# Patient Record
Sex: Female | Born: 1935 | Race: Black or African American | Hispanic: No | Smoking: Former smoker
Health system: Southern US, Community
[De-identification: ages and names within clinical notes are randomized; demographics above are authoritative.]

## PROBLEM LIST (undated history)

## (undated) DIAGNOSIS — J449 Chronic obstructive pulmonary disease, unspecified: Secondary | ICD-10-CM

## (undated) DIAGNOSIS — M545 Low back pain, unspecified: Secondary | ICD-10-CM

## (undated) DIAGNOSIS — R262 Difficulty in walking, not elsewhere classified: Secondary | ICD-10-CM

## (undated) DIAGNOSIS — N390 Urinary tract infection, site not specified: Secondary | ICD-10-CM

## (undated) DIAGNOSIS — R51 Headache: Secondary | ICD-10-CM

## (undated) DIAGNOSIS — I10 Essential (primary) hypertension: Secondary | ICD-10-CM

## (undated) DIAGNOSIS — J189 Pneumonia, unspecified organism: Secondary | ICD-10-CM

## (undated) DIAGNOSIS — M1990 Unspecified osteoarthritis, unspecified site: Secondary | ICD-10-CM

## (undated) DIAGNOSIS — D259 Leiomyoma of uterus, unspecified: Secondary | ICD-10-CM

## (undated) DIAGNOSIS — K565 Intestinal adhesions [bands], unspecified as to partial versus complete obstruction: Secondary | ICD-10-CM

## (undated) DIAGNOSIS — F419 Anxiety disorder, unspecified: Secondary | ICD-10-CM

## (undated) DIAGNOSIS — E785 Hyperlipidemia, unspecified: Secondary | ICD-10-CM

## (undated) HISTORY — PX: COLONOSCOPY, DIAGNOSTIC (SCREENING): SHX174

## (undated) HISTORY — PX: BRAIN SURGERY: SHX531

## (undated) HISTORY — PX: LUMBAR SPINE SURGERY: SHX701

## (undated) HISTORY — PX: TONSILLECTOMY: SUR1361

## (undated) HISTORY — PX: EYE SURGERY: SHX253

## (undated) HISTORY — PX: CERVICAL SPINE SURGERY: SHX589

## (undated) HISTORY — DX: Essential (primary) hypertension: I10

## (undated) HISTORY — DX: Hyperlipidemia, unspecified: E78.5

## (undated) HISTORY — DX: Leiomyoma of uterus, unspecified: D25.9

## (undated) HISTORY — DX: Chronic obstructive pulmonary disease, unspecified: J44.9

## (undated) HISTORY — PX: JOINT REPLACEMENT: SHX530

## (undated) HISTORY — PX: BACK SURGERY: SHX140

---
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
## 1959-10-10 HISTORY — PX: TUBAL LIGATION: SHX77

## 1998-06-02 ENCOUNTER — Encounter: Admission: RE | Admit: 1998-06-02 | Discharge: 1998-06-02 | Payer: Self-pay | Admitting: *Deleted

## 1998-09-29 ENCOUNTER — Emergency Department (HOSPITAL_COMMUNITY): Admission: EM | Admit: 1998-09-29 | Discharge: 1998-09-29 | Payer: Self-pay | Admitting: Emergency Medicine

## 1999-04-11 ENCOUNTER — Ambulatory Visit (HOSPITAL_COMMUNITY): Admission: RE | Admit: 1999-04-11 | Discharge: 1999-04-11 | Payer: Self-pay | Admitting: Family Medicine

## 1999-05-28 ENCOUNTER — Encounter: Payer: Self-pay | Admitting: Emergency Medicine

## 1999-05-28 ENCOUNTER — Inpatient Hospital Stay (HOSPITAL_COMMUNITY): Admission: EM | Admit: 1999-05-28 | Discharge: 1999-05-29 | Payer: Self-pay | Admitting: Emergency Medicine

## 1999-08-05 ENCOUNTER — Other Ambulatory Visit: Admission: RE | Admit: 1999-08-05 | Discharge: 1999-08-05 | Payer: Self-pay | Admitting: Family Medicine

## 2000-02-02 ENCOUNTER — Encounter: Payer: Self-pay | Admitting: Family Medicine

## 2000-02-02 ENCOUNTER — Encounter: Admission: RE | Admit: 2000-02-02 | Discharge: 2000-02-02 | Payer: Self-pay | Admitting: Family Medicine

## 2000-05-07 ENCOUNTER — Encounter: Admission: RE | Admit: 2000-05-07 | Discharge: 2000-08-05 | Payer: Self-pay | Admitting: Anesthesiology

## 2000-07-16 ENCOUNTER — Encounter: Payer: Self-pay | Admitting: Family Medicine

## 2000-07-16 ENCOUNTER — Encounter: Admission: RE | Admit: 2000-07-16 | Discharge: 2000-07-16 | Payer: Self-pay | Admitting: Family Medicine

## 2000-08-06 ENCOUNTER — Other Ambulatory Visit: Admission: RE | Admit: 2000-08-06 | Discharge: 2000-08-06 | Payer: Self-pay | Admitting: Family Medicine

## 2001-03-18 ENCOUNTER — Encounter: Admission: RE | Admit: 2001-03-18 | Discharge: 2001-03-18 | Payer: Self-pay | Admitting: Orthopedic Surgery

## 2001-03-18 ENCOUNTER — Encounter: Payer: Self-pay | Admitting: Orthopedic Surgery

## 2001-05-27 ENCOUNTER — Encounter: Admission: RE | Admit: 2001-05-27 | Discharge: 2001-05-27 | Payer: Self-pay | Admitting: Orthopedic Surgery

## 2001-05-27 ENCOUNTER — Ambulatory Visit (HOSPITAL_BASED_OUTPATIENT_CLINIC_OR_DEPARTMENT_OTHER): Admission: RE | Admit: 2001-05-27 | Discharge: 2001-05-27 | Payer: Self-pay | Admitting: Orthopedic Surgery

## 2001-05-27 ENCOUNTER — Encounter: Payer: Self-pay | Admitting: Orthopedic Surgery

## 2001-07-17 ENCOUNTER — Encounter: Payer: Self-pay | Admitting: Family Medicine

## 2001-07-17 ENCOUNTER — Encounter: Admission: RE | Admit: 2001-07-17 | Discharge: 2001-07-17 | Payer: Self-pay | Admitting: Family Medicine

## 2001-08-14 ENCOUNTER — Other Ambulatory Visit: Admission: RE | Admit: 2001-08-14 | Discharge: 2001-08-14 | Payer: Self-pay | Admitting: Family Medicine

## 2001-08-15 ENCOUNTER — Encounter: Admission: RE | Admit: 2001-08-15 | Discharge: 2001-08-15 | Payer: Self-pay | Admitting: Family Medicine

## 2001-08-15 ENCOUNTER — Encounter: Payer: Self-pay | Admitting: Family Medicine

## 2002-07-21 ENCOUNTER — Encounter: Admission: RE | Admit: 2002-07-21 | Discharge: 2002-07-21 | Payer: Self-pay | Admitting: Family Medicine

## 2002-07-21 ENCOUNTER — Encounter: Payer: Self-pay | Admitting: Family Medicine

## 2002-08-15 ENCOUNTER — Encounter: Admission: RE | Admit: 2002-08-15 | Discharge: 2002-08-15 | Payer: Self-pay | Admitting: Family Medicine

## 2002-08-15 ENCOUNTER — Encounter: Payer: Self-pay | Admitting: Family Medicine

## 2002-10-22 ENCOUNTER — Encounter (INDEPENDENT_AMBULATORY_CARE_PROVIDER_SITE_OTHER): Payer: Self-pay | Admitting: Specialist

## 2002-10-22 ENCOUNTER — Ambulatory Visit (HOSPITAL_COMMUNITY): Admission: RE | Admit: 2002-10-22 | Discharge: 2002-10-22 | Payer: Self-pay | Admitting: Gastroenterology

## 2003-06-04 ENCOUNTER — Encounter: Payer: Self-pay | Admitting: Family Medicine

## 2003-06-04 ENCOUNTER — Encounter: Admission: RE | Admit: 2003-06-04 | Discharge: 2003-06-04 | Payer: Self-pay | Admitting: Family Medicine

## 2003-07-27 ENCOUNTER — Encounter: Payer: Self-pay | Admitting: Family Medicine

## 2003-07-27 ENCOUNTER — Encounter: Admission: RE | Admit: 2003-07-27 | Discharge: 2003-07-27 | Payer: Self-pay | Admitting: Family Medicine

## 2003-08-17 ENCOUNTER — Other Ambulatory Visit: Admission: RE | Admit: 2003-08-17 | Discharge: 2003-08-17 | Payer: Self-pay | Admitting: Family Medicine

## 2003-09-01 ENCOUNTER — Encounter: Admission: RE | Admit: 2003-09-01 | Discharge: 2003-09-01 | Payer: Self-pay | Admitting: Family Medicine

## 2004-07-27 ENCOUNTER — Encounter: Admission: RE | Admit: 2004-07-27 | Discharge: 2004-07-27 | Payer: Self-pay | Admitting: Family Medicine

## 2004-08-23 ENCOUNTER — Other Ambulatory Visit: Admission: RE | Admit: 2004-08-23 | Discharge: 2004-08-23 | Payer: Self-pay | Admitting: Family Medicine

## 2005-06-24 ENCOUNTER — Emergency Department (HOSPITAL_COMMUNITY): Admission: EM | Admit: 2005-06-24 | Discharge: 2005-06-25 | Payer: Self-pay | Admitting: Emergency Medicine

## 2005-07-31 ENCOUNTER — Encounter: Admission: RE | Admit: 2005-07-31 | Discharge: 2005-07-31 | Payer: Self-pay | Admitting: Family Medicine

## 2005-08-24 ENCOUNTER — Encounter: Admission: RE | Admit: 2005-08-24 | Discharge: 2005-08-24 | Payer: Self-pay | Admitting: Family Medicine

## 2005-09-28 ENCOUNTER — Other Ambulatory Visit: Admission: RE | Admit: 2005-09-28 | Discharge: 2005-09-28 | Payer: Self-pay | Admitting: Obstetrics and Gynecology

## 2005-12-09 ENCOUNTER — Emergency Department (HOSPITAL_COMMUNITY): Admission: EM | Admit: 2005-12-09 | Discharge: 2005-12-09 | Payer: Self-pay | Admitting: Family Medicine

## 2006-08-03 ENCOUNTER — Encounter: Admission: RE | Admit: 2006-08-03 | Discharge: 2006-08-03 | Payer: Self-pay | Admitting: Family Medicine

## 2006-08-23 ENCOUNTER — Inpatient Hospital Stay (HOSPITAL_COMMUNITY): Admission: RE | Admit: 2006-08-23 | Discharge: 2006-08-29 | Payer: Self-pay | Admitting: Orthopaedic Surgery

## 2006-08-27 ENCOUNTER — Ambulatory Visit: Payer: Self-pay | Admitting: Physical Medicine & Rehabilitation

## 2007-08-06 ENCOUNTER — Encounter: Admission: RE | Admit: 2007-08-06 | Discharge: 2007-08-06 | Payer: Self-pay | Admitting: Family Medicine

## 2007-10-16 ENCOUNTER — Other Ambulatory Visit: Admission: RE | Admit: 2007-10-16 | Discharge: 2007-10-16 | Payer: Self-pay | Admitting: Family Medicine

## 2008-04-17 ENCOUNTER — Encounter: Admission: RE | Admit: 2008-04-17 | Discharge: 2008-04-17 | Payer: Self-pay | Admitting: Orthopaedic Surgery

## 2008-08-06 ENCOUNTER — Encounter: Admission: RE | Admit: 2008-08-06 | Discharge: 2008-08-06 | Payer: Self-pay | Admitting: Family Medicine

## 2009-08-09 ENCOUNTER — Encounter: Admission: RE | Admit: 2009-08-09 | Discharge: 2009-08-09 | Payer: Self-pay | Admitting: Family Medicine

## 2009-08-13 ENCOUNTER — Encounter: Admission: RE | Admit: 2009-08-13 | Discharge: 2009-08-13 | Payer: Self-pay | Admitting: Family Medicine

## 2009-08-16 ENCOUNTER — Encounter (INDEPENDENT_AMBULATORY_CARE_PROVIDER_SITE_OTHER): Payer: Self-pay | Admitting: Family Medicine

## 2009-08-16 ENCOUNTER — Encounter: Admission: RE | Admit: 2009-08-16 | Discharge: 2009-08-16 | Payer: Self-pay | Admitting: Family Medicine

## 2009-08-24 ENCOUNTER — Encounter: Admission: RE | Admit: 2009-08-24 | Discharge: 2009-08-24 | Payer: Self-pay | Admitting: Anesthesiology

## 2010-10-24 ENCOUNTER — Encounter
Admission: RE | Admit: 2010-10-24 | Discharge: 2010-10-24 | Payer: Self-pay | Source: Home / Self Care | Attending: Family Medicine | Admitting: Family Medicine

## 2010-10-30 ENCOUNTER — Encounter: Payer: Self-pay | Admitting: Family Medicine

## 2010-11-04 ENCOUNTER — Encounter
Admission: RE | Admit: 2010-11-04 | Discharge: 2010-11-04 | Payer: Self-pay | Source: Home / Self Care | Attending: Gastroenterology | Admitting: Gastroenterology

## 2011-02-24 NOTE — Op Note (Signed)
[employer] [facility]  Patient:    Melanie, [name] Visit Number: [id number] MRN: [id number]          Service Type: DSU Location: [facility] Attending Physician:  Milly Jakob Proc. Date: 05/27/01 Adm. Date:  05/27/2001   CC:         [name], M.D.   Operative Report  PREOPERATIVE DIAGNOSES: 1. Rotator cuff tear with impingement. 2. Acromioclavicular joint arthritis.  POSTOPERATIVE DIAGNOSES: 1. Partial-thickness rotator cuff tearing. 2. Anterolateral impingement. 3. Acromioclavicular joint arthritis.  PROCEDURES: 1. Anterolateral subacromial decompression. 2. Partial distal clavicle excision. 3. Debridement of partial-thickness rotator cuff tear from the undersurface    and superior surface.  SURGEON:  Harvie Junior, M.D.  ASSISTANT:  Currie Paris. Thedore Mins.  ANESTHESIA:  Block and general.  BRIEF HISTORY:  She is a 75 year old female with a long history of having left shoulder pain.  She was ultimately evaluated and felt to have good strength, but ultimately an MRI was obtained which showed a full-thickness rotator cuff tear without retraction.  She because of continued complaints of pain ultimately came to the operating room for subacromial decompression and AC joint resection.  DESCRIPTION OF PROCEDURE:  Patient brought to the operating room and after adequate anesthesia was obtained with general anesthetic, the patient was placed supine upon the operating table.  The left shoulder was then prepped and draped in the usual sterile fashion.  Following this, routine arthroscopic examination of the shoulder revealed that the undersurface portion of the rotator cuff and the supraspinatus was absolutely pristine, no evidence of rotator cuff tearing.  There was some musculotendinous cuff prominent in this area, and this was debrided from the undersurface with a suction shaver. Attention was turned back to the infraspinatus area, and again  this looked pristine in the glenohumeral joint.  The biceps tendon was well-attached. There was no glenohumeral arthritis.  Attention was then taken out of the glenohumeral joint up into the subacromial space.  Subacromial space showed a significant anterolateral spur.  The rotator cuff from above was probed thoroughly, and there was no evidence of tearing of the rotator cuff, no full-thickness defect.  There was some partial-thickness tearing, which was debrided quite extensively with the suction shaver.  At this point an anterolateral subacromial decompression was performed with the motorized bur from both the lateral and posterior compartment.  The distal clavicle resection was then undertaken from both the lateral and posterior compartment. At this point the shoulder was copiously irrigated and suctioned dry.  The arthroscopic portals were closed with a bandage, and a sterile and compressive bandage was applied.  The patient was taken to the recovery room, where she was noted to be in satisfactory condition.  Estimated blood loss for the procedure was none. Attending Physician:  Milly Jakob DD:  05/27/01 TD:  05/28/01 Job: [id number]

## 2011-02-24 NOTE — Op Note (Signed)
NAME:  Melanie [name], Melanie [name]                            ACCOUNT NO.:  [id number]   MEDICAL RECORD NO.:  [id number]                   PATIENT TYPE:  AMB   LOCATION:  ENDO                                 FACILITY:  [facility]   PHYSICIAN:  Danise Edge, M.D.                DATE OF BIRTH:  01/24/1936   DATE OF PROCEDURE:  10/22/2002  DATE OF DISCHARGE:                                 OPERATIVE REPORT   PROCEDURE:  Esophagogastroduodenoscopy, colonoscopy and polypectomy.   INDICATIONS FOR PROCEDURE:  Ms. Melanie [name] is 75 year old female born  1935/11/06. Ms. Melanie [name] is referred to me by Dr. Maurice Small to  evaluate guaiac positive stool. Ms. Melanie [name] denies a personal or family  history of colon cancer. Ms. Melanie [name] does take 81 mg aspirin daily. There is  no past history of peptic ulcer disease. She does have chronic obstructive  pulmonary disease.   ALLERGIES:  None.   CHRONIC MEDICATIONS:  Norvasc, Diovan/HCTZ, potassium 81 mg aspirin,  Serevent, Claritin, calcium, multivitamin.   PAST MEDICAL HISTORY:  Hypertension, uterine fibroids, chronic obstructive  pulmonary disease, urticaria, cervical disk surgery, bone spur removed from  the left shoulder.   ENDOSCOPIST:  Danise Edge, M.D.   PREMEDICATION:  Ms. Melanie [name] received a total of Versed 7 mg and Demerol 70 mg  for both procedures.   PROCEDURE:  Esophagogastroduodenoscopy.   DESCRIPTION OF PROCEDURE:  After obtaining informed consent, Ms. Melanie [name] was  placed in the left lateral decubitus position. I administered intravenous  Demerol and intravenous Versed to achieve conscious sedation for the  procedure. The patient's blood pressure, oxygen saturation and cardiac  rhythm were monitored throughout the procedure and documented in the medical  record.   The Olympus gastroscope was passed through the posterior hypopharynx into  the proximal esophagus without difficulty. The hypopharynx and larynx  appeared normal. I did not visualize  the vocal cords.   ESOPHAGOSCOPY:  The proximal, mid and lower segments of the esophagus  appeared normal.   GASTROSCOPY:  Retroflexed view of the gastric cardia and fundus was normal.  The gastric body appeared normal.   DUODENOSCOPY:  The duodenal bulb, mid duodenum, distal duodenum and proximal  jejunum all appeared normal.   ASSESSMENT:  Normal esophagogastroduodenoscopy.   PROCEDURE:  Colonoscopy.   DESCRIPTION OF PROCEDURE:  Anal inspection was normal. Digital rectal exam  was normal. The Olympus pediatric video colonoscope was introduced into the  rectum and eventually advanced to the cecum. Colonic preparation for the  exam today was excellent.   RECTUM:  Normal.   SIGMOID COLON AND DESCENDING COLON:  From the distal sigmoid colon at 25 cm  from the anal verge, a 2 mm sessile polyp was removed with the  electrocautery snare.   SPLENIC FLEXURE:  Normal.   TRANSVERSE COLON:  Normal.   HEPATIC FLEXURE:  Normal.   ASCENDING COLON:  There is  an inflamed fold in the proximal ascending colon  characterized by mucosal erythema, edema and scattered erosions. Multiple  biopsies were taken. A 0.5 mm sessile polyp was removed from the proximal  ascending colon.   CECUM AND ILEOCECAL VALVE:  Normal.   ASSESSMENT:  1. A 2 mm polyp was removed from the distal sigmoid colon, a 0.5 mm     diminutive polyp was removed from the proximal ascending colon. Both     polyps were submitted in one bottle for pathological evaluation.  2. An inflamed fold in the proximal ascending colon was biopsied.                                                Danise Edge, M.D.    MJ/MEDQ  D:  10/22/2002  T:  10/22/2002  Job:  [id number]   cc:   [name], M.D.  [address] [place]  [place] [address]  Fax: [phone]

## 2011-02-24 NOTE — Procedures (Signed)
[facility]  Patient:    Melanie [name], SON                         MRN: [id number] Proc. Date: 05/07/00 Adm. Date:  [id number] Attending:  Thyra Breed CC:         Lillia Dallas. Murray Hodgkins, M.D.   Procedure Report  PROCEDURE:  Facet joint nerve blocks at L3-L4, L4-L5 and L5, S1.  DIAGNOSIS:  Facet joint arthritis with underlying degenerative disk disease in the lumbar region.  HISTORY OF PRESENT ILLNESS:  Melanie [name] is a very pleasant 75 year old who was sent to [place] by Dr. Murray Hodgkins for facet joint injections at the L4-L5 and L5, S1 level.  The patient states that she has a long history of back discomfort which has become progressively worse over the past two years.  She notes that the pain is characterized as a dull ache which is localized to the lumbosacral region.  It only bothers her when she is walking or standing.  It does not bother her if she is sitting or lying still.  There is no associated bowel or bladder incontinence or weakness.  She occasionally has had some numbness into her buttock cheeks bilaterally but not beyond this point.  She is seeing Dr. Lala Lund, her primary care physician, who put her through physical therapy and treated her with medications which apparently did not help.  She underwent an MRI in April which showed diffuse degenerative disk disease and facet joint arthropathy at L4-L5 and L5, S1.  She was sent to Dr. Gerlene Fee who did not have any surgical intervention to offer.  He tried prednisone which did not result in a great deal of improvement.  She was recently seen by Dr. Murray Hodgkins who has placed her back into physical therapy and recommended a trial of facet joint injections.  CURRENT MEDICATIONS:  PremPro, KCL, hydrochlorothiazide, Lotrel, Serevent, and Allegra.  ALLERGIES:  She has no known drug allergies.  FAMILY HISTORY:  Positive for coronary artery disease, diabetes, hypertension, and stroke.  PAST SURGICAL HISTORY:   Significant for a trephination for headaches by Dr. Creed Copper back in the 60s, tonsillectomy, BTL, and cervical foraminotomy by Dr. Gerlene Fee in 1997.  SOCIAL HISTORY:  The patient is a nonsmoker, nondrinker.  She drives a school bus.  ACTIVE MEDICAL PROBLEMS:  Hypertension, asthma, and idiopathic urticaria, as well as a history of headaches.  REVIEW OF SYSTEMS:  GENERAL: Negative.  HEAD:  Negative. EYES: Negative. NOSE/MOUTH/THROAT:  Negative.  EARS: Negative.  PULMONARY:  Significant for early emphysema.  CARDIOVASCULAR:  Chronic hypertension.  GI: Significant for cramps for otherwise negative.  GU:  Negative.  MUSCULOSKELETAL:  See HPI. NEUROLOGICAL:  See HPI.  HEMATOLOGIC:  Negative.  CUTANEOUS: Negative. ENDOCRINE:  Negative.  PSYCHIATRIC:  Negative.  ALLERGY/IMMUNOLOGIC: Positive for chronic urticaria, currently on Allegra.  PHYSICAL EXAMINATION:  VITAL SIGNS:  Blood pressure 137/63.  Heart rate is 73.  Respiratory rates 18. O2 saturations 99%.  Pain level is 7/10.  GENERAL:  This is an obese female in no acute distress  HEENT:  Head was normocephalic, atraumatic.  Eyes with extraocular movements intact with conjunctive sclerae clear.  Nose with patent nares without discharge.  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  NECK:  Neck was supple without lymphadenopathy.  Carotids were 2+ and symmetric without bruits.  LUNGS:  Lungs were clear to auscultation and percussion.  BREASTS/ABDOMINAL/PELVIC/RECTAL:  Examinations were not performed.  BACK:  Back exam  revealed accentuated lordosis with increased pain and popping when she hyperextends, decreased pain on forward flexion.  Gait is intact.  EXTREMITIES:  No clubbing, cyanosis, or edema with radial pulses and dorsalis pedis pulses 2+ and symmetric.  NEUROLOGIC:  The patient is oriented x 4.  Cranial nerves 2-12 are grossly intact.  Deep tendon reflexes are symmetric in the upper and lower extremities with downgoing toes.  Motor is 5/5  with symmetric bulk and tone. Sensation is intact to pinprick, light touch, and vibratory sense.  Coordination was grossly intact.  IMPRESSION: 1. Chronic low back pain consistent with a facet joint syndrome with    increased pain on hyperextension with MRI demonstrating facet joint    arthropathy and degenerative disk disease. 2. Chronic medical problems including hypertension, early emphysema,    idiopathic urticaria, and history of headaches for which she has    been surgically treated.  DISPOSITION:  I discussed the potential risks, benefits, and limitations of a facet joint nerve injection.  She is aware that this is diagnostic as well as potentially therapeutic.  DESCRIPTION OF PROCEDURE:  After informed consent was obtained, the patient was taken to the fluoroscopy suite where she was placed in a prone position with a pillow under her abdomen and monitors placed.  Using fluoroscope guidance, I identified the lumbar vertebrae of L1 through L5 and marked the spinous processes.  At the junction of the superior articulating process and the transverse process, marks were made at L4-L5, L5, S1, and at the sacral cornua mark was made over the skin.  The area was prepped with Betadine x 3. I anesthetized both side with 1% lidocaine using a 35 gauge needle, using a total of 6 cc of 1% lidocaine.  The 22 gauge Chiba needles were introduced to the junction of the superior articulating process and the transverse process at L4-L5, L5, S1 and the sacral cornu bilaterally.  Aspiration was negative for blood and CSF.  I injected 0.5 cc of 1% lidocaine initially on the right side and waited 45 to 50 seconds with no signs of a spinal.  Medrol 13 mg with 1% lidocaine was injected at each level, and the needle was flushed with 1% lidocaine.  The same procedure was performed on the right side at the same three levels.  The needles were removed intact.  Fifteen minutes later, the patient had  minimal pain on activity.  Postprocedure condition - stable.   DISCHARGE INSTRUCTIONS: 1. Resume previous diet. 2. Limitations on activities per instruction sheet. 3. Continue on current medications. 4. Follow up with Dr. Murray Hodgkins as previously arranged.  The patient was advised that she seems to have had a very good response to the injection based on her response to the lidocaine.  I advised her that she may get added benefits from adding in the corticosteroids.  She is aware of the side effects of corticosteroids which I reviewed with her in detail. DD:  05/07/00 TD:  05/08/00 Job: [id number]

## 2011-02-24 NOTE — H&P (Signed)
NAME:  Melanie [name], Melanie [name] NO.:  [id number]   MEDICAL RECORD NO.:  [id number]            PATIENT TYPE:   LOCATION:                                 FACILITY:   PHYSICIAN:  Sharolyn Douglas, M.D.        DATE OF BIRTH:  01/27/1946   DATE OF ADMISSION:  08/23/2006  DATE OF DISCHARGE:                                HISTORY & PHYSICAL   CHIEF COMPLAINT:  Low-back pain.   HISTORY OF PRESENT ILLNESS:  The patient is a 75 year old female who has had  a long history of problems with her back.  The pain has gotten to the point  that it is increasing.  Her activities of daily living, her quality of life  is suffering significantly.  She has tried conservative treatments and has  failed to have any lasting improvement of her symptoms.  Secondary to all of  the above, it was thought that the best course of management would be  decompression and fusion.  Dr. Noel Gerold and I both did discuss all the risks  and potential benefits of the procedure with her.  She indicated  understanding and opted to proceed.   ALLERGIES:  None.   MEDICATIONS:  Norvasc, Coreg, hydrochlorothiazide, Klor-Con, Spiriva,  aspirin (which she is stopping in preparation for surgery), multivitamin  daily, calcium, and vitamin D.   PAST MEDICAL HISTORY:  COPD and hypertension.   PAST SURGICAL HISTORY:  None.   SOCIAL HISTORY:  The patient is married.  She denies tobacco or alcohol use.  She has 4 children.   FAMILY HISTORY:  Noncontributory.   REVIEW OF SYSTEMS:  Negative.   PHYSICAL EXAMINATION:  VITAL SIGNS:  Blood pressure 146/80, respirations 16  and unlabored, pulse is 76 and regular.  GENERAL APPEARANCE:  The patient is a 75 year old black female who is alert  and oriented in no acute distress.  She Is well nourished, well groomed,  appears his stated age, pleasant and cooperative to exam.  HEENT:  Head is normocephalic, atraumatic.  Pupils are equal, round, and  reactive to light.  Extraocular movements  are intact.  Nares are patent and  pharynx clear.  NECK:  Supple to palpation.  No lymphadenopathy, thyromegaly, or bruits  appreciated.  CHEST:  Clear to auscultation bilaterally.  No rales, rhonchi, stridor,  wheezes, or friction rales.  Breast is not deformed.  HEART:  S1, S2.  Regular rate and rhythm.  No murmurs, gallops, or rubs  noted.  ABDOMEN:  Soft to palpation.  Nontender, nondistended, no organomegaly  noted.  GU:  Not currently performed.  EXTREMITIES:  As per HPI.  SKIN:  Intact without any lesions or rashes.   IMPRESSION:  1. Degenerative disk disease and spinal stenosis L2 to L5 with L3-4 and L4-      5 spondylolisthesis.   PLAN:  1. Admit to [facility] on August 23, 2006 for an L2 to L5      laminectomy as well as an L3 to L5 posterior spinal fusion.  Surgery  will be done by Dr. Sharolyn Douglas.  2. The patient will obtain a rehab consult postoperatively.      Verlin Fester, P.A.      Sharolyn Douglas, M.D.  Electronically Signed    CM/MEDQ  D:  08/03/2006  T:  08/04/2006  Job:  [id number]   cc:   Sharolyn Douglas, M.D.

## 2011-02-24 NOTE — Op Note (Signed)
NAMEMarland Kitchen  Melanie [name], Melanie [name]                  ACCOUNT NO.:  [id number]   MEDICAL RECORD NO.:  [id number]          PATIENT TYPE:  INP   LOCATION:  2899                         FACILITY:  [facility]   PHYSICIAN:  Sharolyn Douglas, M.D.        DATE OF BIRTH:  June 30, 1936   DATE OF PROCEDURE:  08/23/2006  DATE OF DISCHARGE:                                 OPERATIVE REPORT   DIAGNOSIS:  1. Degenerative lumbar spondylolisthesis.  2. Lumbar spinal stenosis.  3. Chronic back and left greater than right lower extremity pain.   PROCEDURE:  1. L2-3, L3-4 and L4-5 lumbar laminectomy with wide decompression of the      thecal sac and nerve roots bilaterally.  2. Transforaminal lumbar interbody fusion L3-4 with placement of 9 mm PEEK      cage.  3. Segmental pedicle screw instrumentation L3-L5 using the Abbott spine      system.  4. Posterior spinal arthrodesis L3-L5.  5. Local autogenous bone graft supplemented with OP1 BMP.   SURGEON:  Sharolyn Douglas, MD   ASSISTANT:  Verlin Fester, PA   ANESTHESIA:  General endotracheal.   ESTIMATED BLOOD LOSS:  300 mL   COMPLICATIONS:  None.   INDICATIONS:  The patient is a 75 year old female with chronic progressive  back and left greater than right lower extremity pain.  Her imaging studies  show degenerative spondylolisthesis through flexion and extension at L3-4,  L4-5.  MRI scan shows severe spinal stenosis L3-4 moderate L2-3 and L4-5.  She has failed other conservative treatment modalities now elects to undergo  lumbar decompression and fusion of the unstable segments in hopes of  improving her symptoms.  Risk, benefits, alternatives were reviewed.   PROCEDURE:  After informed consent, the patient was taken to the operating  room.  Underwent general endotracheal anesthesia without difficulty given  prophylactic IV antibiotics.  Neuro monitoring was established in the form  of lower extremity EMGs and SSEP's.  She was turned prone onto the [place]  frame.  All bony  prominences padded.  Face and eyes protected at all times.  Back prepped, draped usual sterile fashion.  A midline incision was made  from L2 down to L5.  Dissection was carried sharply through the adipose  layer which was very thick.  The deep fascia was incised and a subperiosteal  exposure was carried out to the tips the transverse processes of L3, L4 and  L5 bilaterally.  Deep retractors were placed.  Intraoperative x-ray was  taken to confirm the levels.  We found the facette joints to be  hypertrophied at L3-4 and L4-5.  These were debrided.  We then placed  pedicle screws at L3, L4 and L5 bilaterally using anatomic probing  technique.  Each pedicle hole was started with the awl followed by the  pedicle probe.  Each hole was palpated with ball feeler.  There were no  breeches.  Each pedicle was tapped with a 6.0 tap.  We then placed 6.5 x 50  mm screws.  The bone quality was good and the screw purchase was  excellent.  Each screw was stimulated using triggered EMGs and there were no deleterious  changes.  It should be noted that before placing the screws, the transverse  processes were decorticated in preparation for the arthrodesis.  This point  we turned our attention to performing a lumbar laminectomy by removing the  entire spinous process and lamina of L2, L3 and L4 bilaterally.  High-speed  bur was used to thin the lamina.  Kerrison punches used to complete the  laminectomy.  We then took the laminectomy out lateral flush with the  pedicles.  We identified each nerve root L3, L4 and L5 and confirmed that it  was decompressed in the lateral recess and also checked the foramen.  Once  we were satisfied with the decompression, we evaluated the facette joints.  At L3-4 the level was found to be very unstable and we elected to proceed  with a transforaminal lumbar interbody fusion to help reduce the  spondylolisthesis and prevent early failure of the fusion.  There was also  tightness  in the foramen on the left side at L3-4 and was felt that the TLIF  would allow for further indirect decompression of this as well.  The  remaining facette joint L3-4 the left side was osteotomized.  The exiting  and transversing nerve roots were identified, the disk space was entered.  Radical diskectomy completed. The cartilaginous endplates were scraped clean  with curved curettes.  We dilated the disk space up to 9 mm.  We packed the  disk space with BMP along with local bone graft obtained from the  laminectomy.  A 9-mm PEEK cage was packed with BMP inserted into the  interspace tamped anteriorly and across the midline.  We then completed the  posterior fusion by packing the remaining local bone graft obtained from the  laminectomy into the lateral gutters over the transverse processes.  We then  placed 70 mm titanium rods into the polyaxial screw heads.  Compression was  applied across L3-4 before shearing off the locking caps.  Cross connector  was placed.  Hemostasis achieved.  A deep Hemovac drain was left, the deep  fascia closed with a running #1 Vicryl suture.  Subcutaneous layer closed  with 0 Vicryl and 2-0 Vicryl followed by a running 3-0 nylon suture on the  skin edges.  Sterile dressing applied.  The patient was turned supine,  extubated without difficulty and transferred to recovery in stable  condition.   It should be noted my assistant [facility], PA was present throughout the  procedure including during positioning, the exposure, the decompression, the  instrumentation and fusion and she also assisted with wound closure.      Sharolyn Douglas, M.D.  Electronically Signed     MC/MEDQ  D:  08/23/2006  T:  08/23/2006  Job:  [id number]

## 2011-02-24 NOTE — Discharge Summary (Signed)
NAMEMarland [name]  AIYA, KEACH                  ACCOUNT NO.:  [id number]   MEDICAL RECORD NO.:  [id number]          PATIENT TYPE:  INP   LOCATION:  5028                         FACILITY:  [facility]   PHYSICIAN:  Sharolyn Douglas, M.D.        DATE OF BIRTH:  [birth year]   DATE OF ADMISSION:  08/23/2006  DATE OF DISCHARGE:  08/29/2006                               DISCHARGE SUMMARY   ADMITTING DIAGNOSES:  1. L3-L5 spinal stenosis and spondylosis.  2. Chronic obstructive pulmonary disease.  3. Hypertension.   DISCHARGE DIAGNOSES:  Status post L3-5 laminectomy posterior spinal  fusion.   SURGEON:  Max Noel Gerold.   ASSISTANT:  [employer].   ANESTHESIA:  General.   CONSULTS:  [facility].   LABS:  Postoperatively H&H were monitored x4 days, reached a low of 8.5  and 25.2, was transfused and increased up to 10.7 and 31.5 the following  day.  Complete metabolic panel from preop was normal.  PT/INR and PTT  preop were normal.  Postoperatively, basic metabolic panel was monitored  x2 days.  She did have an elevated glucose of 129 and 136 on postop day  1 and 2.  Her potassium was 3.0 on August 25, 2006.  Creatinine was  1.3 on August 25, 2006.  Blood type was type B positive and antibody  screen was negative.  EKG from November 12th, did show normal sinus  rhythm, no significant changes since previous EKG, read by Laqueta Carina, MD.  On November 17th showed no significant change, read by  previous EKG.  Normal sinus rhythm read by Orvan Seen, MD and also  [name], MD.  X-rays from November 15th, a lumbar spine showed  intraoperative views, showing L3-5 fusion.   BRIEF HISTORY:  Patient is a 75 year old female who has had a long  history with problems with her back.  She has failed to respond to  conservative treatment.  We thought her best course of management would  be a decompression and fusion at L3-5.  Risks and benefits of this  procedure were discussed with the patient at length  by Dr. Noel Gerold, as  well as me.  She indicates understanding and opted to proceed.   HOSPITAL COURSE:  The patient is a 75 year old female who was admitted  to the hospital on August 23, 2006, for the above-listed procedure.  She was taken to the operating room, tolerated the procedure well  without any trouble or complications was transferred to the recovery  room in stable condition.   Postoperatively, routine orthopedic spine protocol was followed, she  progressed along well.   On August 25, 2006, patient did complain of chest pain, it was not  radiating.  It was in the left chest area.  EKG was done at that time  and it showed normal EKG, normal sinus rhythm.  Patient was given some  Mylanta and her chest pain did resolve.  She was stable throughout the  entire process.   Patient's anemia was previously down to 8.5 on November 18th, and she  was transfused 2  units of packed red blood cells at that time and her  hemoglobin improved as previously dictated by the following day.  Rehab  consult was ordered because of the slow progress with physical therapy  at that time as well.   By August 28, 2006, patient was doing much better.  She was starting  to make better progress with physical therapy.  It was felt that she  would likely be able to go home with home health physical therapy rather  than requiring rehab.  On August 29, 2006, the patient had met all  orthopedic goals.  She was doing extremely well.  She was stable and  ready for discharge from both medical and orthopedic standpoint.   DISCHARGE PLAN:  Patient is a 75 year old female status post lumbar  fusion, doing well.   ACTIVITY:  Daily ambulation program.  Brace on when she is up.  Back  precautions at all times.  No lifting heavier than 5 pounds.  Dressing  changes as needed to her back.  Keep incision dry until all drainage  stops.   FOLLOWUP:  Follow up with Dr. Noel Gerold 2 weeks postoperatively.    MEDICATIONS ON DISCHARGE:  1. Vicodin for pain.  2. Robaxin for muscle spasm.  3. Multivitamin daily.  4. Calcium daily.  5. Colace twice daily.  6. Laxative as needed.   DIET:  Regular, as tolerated.   CONDITION ON DISCHARGE:  Stable, improved.   DISPOSITION:  Patient being discharged to her home with family  assistance, as well as home health, physical therapy, and occupational  therapy.      Verlin Fester, P.A.      Sharolyn Douglas, M.D.  Electronically Signed    CM/MEDQ  D:  10/17/2006  T:  10/17/2006  Job:  [id number]   cc:   Sharolyn Douglas, M.D.

## 2011-10-16 LAB — TYPE AND SCREEN
AB Screen Gel: NEGATIVE
ABO Rh: B POS

## 2011-10-17 ENCOUNTER — Inpatient Hospital Stay
Admission: RE | Admit: 2011-10-17 | Disposition: A | Discharge status: Home Health | Payer: Self-pay | Source: Ambulatory Visit | Attending: Specialist | Admitting: Specialist

## 2011-10-17 LAB — CALCIUM, IONIZED: Calcium, Ionized: 2.26 mEq/L — ABNORMAL LOW (ref 2.30–2.58)

## 2011-10-17 LAB — BLOOD GAS, ARTERIAL
Arterial Total CO2: 56.5 mEq/L — ABNORMAL HIGH (ref 24.0–30.0)
Base Excess, Arterial: 2.6 mEq/L — ABNORMAL HIGH (ref <=2.0)
HCO3, Arterial: 26.8 mEq/L (ref 23.0–29.0)
O2 Sat, Arterial: 100 % (ref 95.0–100.0)
Temperature: 37
pCO2, Arterial: 38.7 mmHg (ref 35.0–45.0)
pH, Arterial: 7.449 (ref 7.350–7.450)
pO2, Arterial: 130 mmHg — ABNORMAL HIGH (ref 80.0–90.0)

## 2011-10-17 LAB — LACTIC ACID, PLASMA: Lactic Acid: 1.3 mEq/L (ref 0.5–2.2)

## 2011-10-17 LAB — COOXIMETRY PROFILE
Carboxyhemoglobin: 1.2 % (ref 0.0–3.0)
Hematocrit Total Calculated: 28 % — ABNORMAL LOW (ref 37.0–47.0)
Hemoglobin Total: 9.1 g/dL — ABNORMAL LOW (ref 12.0–16.0)
Methemoglobin: 1.4 % (ref 0.0–3.0)
O2 Content: 12.8 Vol %
Oxygenated Hemoglobin: 97.4 % (ref 85.0–98.0)

## 2011-10-17 LAB — ELECTROLYTES WHOLE BLOOD
Chloride, WB: 108 mEq/L — ABNORMAL HIGH (ref 98–106)
Whole Blood Glucose: 107 mg/dL — ABNORMAL HIGH (ref 70–100)
Whole Blood Potassium: 3 mEq/L — ABNORMAL LOW (ref 3.5–5.3)
Whole Blood Sodium: 142 mEq/L (ref 136–146)

## 2011-10-18 LAB — CBC
Hematocrit: 31.2 % — ABNORMAL LOW (ref 37.0–47.0)
Hgb: 9.9 g/dL — ABNORMAL LOW (ref 12.0–16.0)
MCH: 27.3 pg — ABNORMAL LOW (ref 28.0–32.0)
MCHC: 31.7 g/dL — ABNORMAL LOW (ref 32.0–36.0)
MCV: 86.2 fL (ref 80.0–100.0)
MPV: 11.3 fL (ref 9.4–12.3)
Nucleated RBC: 0 /100 WBC
Platelets: 183 10*3/uL (ref 140–400)
RBC: 3.62 10*6/uL — ABNORMAL LOW (ref 4.20–5.40)
RDW: 15 % (ref 12–15)
WBC: 9.77 10*3/uL (ref 3.50–10.80)

## 2011-10-18 LAB — BASIC METABOLIC PANEL
Anion Gap: 9 (ref 5.0–15.0)
BUN: 13 mg/dL (ref 7.0–19.0)
CO2: 23 mEq/L (ref 22–29)
Calcium: 7.5 mg/dL — ABNORMAL LOW (ref 7.9–10.6)
Chloride: 107 mEq/L (ref 98–107)
Creatinine: 0.7 mg/dL (ref 0.6–1.0)
Glucose: 113 mg/dL — ABNORMAL HIGH (ref 70–100)
Potassium: 3.6 mEq/L (ref 3.5–5.1)
Sodium: 139 mEq/L (ref 136–145)

## 2011-10-18 LAB — COOXIMETRY PROFILE
Carboxyhemoglobin: 2 % (ref 0.0–3.0)
Hematocrit Total Calculated: 30.8 % — ABNORMAL LOW (ref 37.0–47.0)
Hemoglobin Total: 10.1 g/dL — ABNORMAL LOW (ref 12.0–16.0)
Methemoglobin: 1.3 % (ref 0.0–3.0)
O2 Content: 13.3 Vol %
Oxygenated Hemoglobin: 93.1 % (ref 85.0–98.0)

## 2011-10-18 LAB — ELECTROLYTES WHOLE BLOOD
Chloride, WB: 109 mEq/L — ABNORMAL HIGH (ref 98–106)
Whole Blood Glucose: 116 mg/dL — ABNORMAL HIGH (ref 70–100)
Whole Blood Potassium: 3.2 mEq/L — ABNORMAL LOW (ref 3.5–5.3)
Whole Blood Sodium: 142 mEq/L (ref 136–146)

## 2011-10-18 LAB — BLOOD GAS, ARTERIAL
Arterial Total CO2: 58 mEq/L — ABNORMAL HIGH (ref 24.0–30.0)
Base Excess, Arterial: 1.4 mEq/L (ref <=2.0)
HCO3, Arterial: 27.4 mEq/L (ref 23.0–29.0)
O2 Sat, Arterial: 96.3 % (ref 95.0–100.0)
Temperature: 37
pCO2, Arterial: 50.7 mmHg — ABNORMAL HIGH (ref 35.0–45.0)
pH, Arterial: 7.34 — ABNORMAL LOW (ref 7.350–7.450)
pO2, Arterial: 78.8 mmHg — ABNORMAL LOW (ref 80.0–90.0)

## 2011-10-18 LAB — LACTIC ACID, PLASMA: Lactic Acid: 0.7 mEq/L (ref 0.5–2.2)

## 2011-10-18 LAB — HEMOLYSIS INDEX: Hemolysis Index: 1 Index (ref 0–18)

## 2011-10-18 LAB — GFR: EGFR: >60

## 2011-10-18 LAB — CALCIUM, IONIZED: Calcium, Ionized: 2.27 mEq/L — ABNORMAL LOW (ref 2.30–2.58)

## 2011-10-19 LAB — URINALYSIS WITH MICROSCOPIC
Bilirubin, UA: NEGATIVE
Blood, UA: NEGATIVE
Glucose, UA: NEGATIVE
Nitrite, UA: NEGATIVE
Protein, UR: NEGATIVE
Specific Gravity UA POCT: 1.023 (ref 1.001–1.035)
Urine pH: 5 (ref 5.0–8.0)
Urobilinogen, UA: NEGATIVE mg/dL

## 2011-10-19 LAB — BASIC METABOLIC PANEL
Anion Gap: 9 (ref 5.0–15.0)
BUN: 10 mg/dL (ref 7.0–19.0)
CO2: 24 mEq/L (ref 22–29)
Calcium: 8.1 mg/dL (ref 7.9–10.6)
Chloride: 109 mEq/L — ABNORMAL HIGH (ref 98–107)
Creatinine: 0.6 mg/dL (ref 0.6–1.0)
Glucose: 103 mg/dL — ABNORMAL HIGH (ref 70–100)
Potassium: 3.6 mEq/L (ref 3.5–5.1)
Sodium: 142 mEq/L (ref 136–145)

## 2011-10-19 LAB — GFR: EGFR: >60

## 2011-10-19 LAB — HEMOGLOBIN AND HEMATOCRIT, BLOOD
Hematocrit: 28.7 % — ABNORMAL LOW (ref 37.0–47.0)
Hgb: 9.1 g/dL — ABNORMAL LOW (ref 12.0–16.0)

## 2011-10-19 LAB — HEMOLYSIS INDEX: Hemolysis Index: 5 Index (ref 0–18)

## 2011-10-20 LAB — BASIC METABOLIC PANEL
Anion Gap: 9 (ref 5.0–15.0)
BUN: 11 mg/dL (ref 7.0–19.0)
CO2: 26 mEq/L (ref 22–29)
Calcium: 8.1 mg/dL (ref 7.9–10.6)
Chloride: 107 mEq/L (ref 98–107)
Creatinine: 0.6 mg/dL (ref 0.6–1.0)
Glucose: 110 mg/dL — ABNORMAL HIGH (ref 70–100)
Potassium: 3.7 mEq/L (ref 3.5–5.1)
Sodium: 142 mEq/L (ref 136–145)

## 2011-10-20 LAB — HEMOLYSIS INDEX: Hemolysis Index: 1 Index (ref 0–18)

## 2011-10-20 LAB — HEMOGLOBIN AND HEMATOCRIT, BLOOD
Hematocrit: 28.3 % — ABNORMAL LOW (ref 37.0–47.0)
Hgb: 8.9 g/dL — ABNORMAL LOW (ref 12.0–16.0)

## 2011-10-20 LAB — GFR: EGFR: >60

## 2011-10-21 LAB — PREPARE RBC

## 2011-10-23 NOTE — Consults (Signed)
DOMIQUE, REARDON                                        MRN:          [id number]                                                          Account:      [id number]                                        Document ID:  [id number]                                                   Service Date: 10/18/2011                                                                                    Admit Date: 10/17/2011     Patient Location: A2620-01  Patient Type: I     CONSULTING PHYSICIAN: Providence Lanius MD     REFERRING PHYSICIAN: Claudie Revering MD        REASON FOR ADMISSION:  Medical management.     HISTORY OF PRESENT ILLNESS:  Ms. Lindo is a pleasant 76 year old female who was brought in electively  for revision of L2-L4 spinal fusion.  The patient is now postoperative day  #1 and doing relatively well.  Her other medical problems include that of  hypertension and COPD.  She does have dyspnea on exertion.  She denies any  chest pain.  Denies any cough, fevers, or chills.  She does have complaints  of neuropathy in her lower extremities, which has been chronic in nature.  Her usual followup is in the [facility] area.  Her cardiac nuclear  stress test was done on October 12, 2011, with no evidence of ischemia and a  normal LV function with an ejection fraction of 62%.     PAST MEDICAL HISTORY:  1.  Hypertension.  2.  Chronic low back pain  3.  Spinal stenosis.  4.  Morbid obesity.  5.  Chronic obstructive pulmonary disease.     PAST SURGICAL HISTORY:  1.  C-spine surgery.  2.  Lumbar spine surgery.  3.  Foot surgery.     ALLERGIES:  No known drug allergies.     OUTPATIENT MEDICATIONS:  Amlodipine, hydrochlorothiazide, clonidine, Spiriva inhaler, multivitamin,  and 1 other antihypertensive, which she does not recall the name of.     SOCIAL HISTORY:  The patient lives at home with her daughter.  She did have a prior history  Page 1 of 3  DEETRA, BOOTON                                        MRN:          [id number]                                                          Account:      [id number]                                        Document ID:  [id number]                                                   Service Date: 10/18/2011                                                                                    of smoking, but she quit approximately 14 years ago.  She drinks  occasionally.     FAMILY HISTORY:  Positive for hypertension.     REVIEW OF SYSTEMS:  No headaches.  No blurring of vision.  No hearing deficit.  No known  thyroid abnormalities.  No chest pain.  Positive for dyspnea on exertion.  No abdominal pain.  No nausea or vomiting.  Her low back pain is relatively  under control with the help of a PCA.     PHYSICAL EXAMINATION:  VITAL SIGNS:  Blood pressure 119/55 with a pulse of 85, breathing 18,  temperature 99.7 with a T-max of 100.4.  GENERAL:  Elderly female lying in bed in no apparent distress.  HEENT:  Atraumatic, normocephalic, sclerae is anicteric.  Oral mucosa is  moist.  NECK:  Supple, no visible JVD, no carotid bruit.  CARDIOVASCULAR:  Regular rate and rhythm, S1, S2.  CHEST:  Clear to auscultation.  ABDOMEN:  Soft, obese, nontender, positive bowel sounds.  EXTREMITIES:  There is no edema, cyanosis, or clubbing.  There is evidence  of marked tenderness upon palpation of the lower extremities.  NEUROLOGIC:  Awake, alert, oriented x3.     LABORATORY DATA:  Sodium 139, potassium 3.6, chloride 107, CO2 23, BUN 13, creatinine 0.7,  blood sugars 113, serum calcium 10.5, ionized calcium 2.27.  WBC count 9.7,  hemoglobin 9.9, hematocrit 31.2, platelet count 183.  Arterial blood gas  with a pH of 7.34, pCO2 51, pO2 79, bicarbonate of 27.     IMPRESSION:  1.  Status post revision of L2-L4 fusion.  2.  Hypertension.  3.  Chronic obstructive pulmonary disease.  4.  Anemia.  5.  Morbid obesity.  PLAN:  1.  Would  resume amlodipine and hydrochlorothiazide.  2.  Will have clonidine on board for on an as needed basis.  3.  Nebulizers on a p.r.n. basis.  4.  Spiriva inhaler once a day  5.  Deep venous thrombosis prophylaxis.                                                                                                           Page 2 of 3  VERNELL, BACK                                        MRN:          [id number]                                                          Account:      [id number]                                        Document ID:  [id number]                                                   Service Date: 10/18/2011                                                                                    6.  Obtain urinalysis with cultures and sensitivities.           Electronic Signing Provider     D:  10/18/2011 12:47 PM by Dr. Ladona Mow. Marney Doctor, MD ([id number])  T:  10/18/2011 13:53 PM by [id number]        cc:  Page 3 of 3  Authenticated by Tona Sensing, MD ([id number]) On 10/23/2011 07:12:42 PM

## 2011-10-27 NOTE — Op Note (Signed)
[name], [name]                                           MRN:          [id number]                                                          Account:      [id number]                                        Document ID:  [id number]                                                   Procedure Date: 10/17/2011                                                                                    Admit Date: 10/17/2011     Patient Location: ASDS-03  Patient Type: I     SURGEON: Claudie Revering MD  ASSISTANT:        SURGEON:  Nicholas Lose.     ASSISTANT:  Randa Lynn, PA.     PREOPERATIVE DIAGNOSES:  1.  Lumbar stenosis.  2.  Adjacent segment instability and stenosis.  3.  Neurogenic claudication complaints, refractory to conservative care.     POSTOPERATIVE DIAGNOSES:  1.  Lumbar stenosis.  2.  Adjacent segment instability and stenosis.  3.  Neurogenic claudication complaints, refractory to conservative care.     TITLE OF PROCEDURE:  1.  Revision lumbar laminectomy, bilateral, with decompression of cauda  equina nerve roots, medial facetectomies, and wide bilateral foraminotomies  at the level of L1-L2, L2-L3 and L3-L4.  2.  Transfacet decompression at L2-L3 for far lateral and foraminal  stenosis bilaterally.  3.  Posterior interbody arthrodesis at L2-L3.  4.  Insertion of biomechanical interbody device at the level of L2-L3.  5.  Exploration of prior fusion from L3 to L5.  6.  Use of posterior instrumentation, a domino segmental system, connecting  L2 to L3 through L5.  7.  Posterolateral fusion from L2 to L3.  8.  Use of local autograft cleaned of muscular attachments.  9.  Use of allograft Grafton as an autograft extender.  10.  Use of intraoperative fluoroscopy.  11.  Use of intraoperative neural monitoring.     ANESTHESIA:  General endotracheal anesthetic.  Page 1 of 4  [name], [name]                                            MRN:          [id number]                                                          Account:      [id number]                                        Document ID:  [id number]                                                   Procedure Date: 10/17/2011                                                                                    BLOOD LOSS:  100 mL with 100 returned via Cell [employer].     COMPLICATIONS:  None.     FINDINGS:  Rather critical stenosis throughout the prior level of decompression,  necessitating transfacet resection at L2-L3 bilaterally.     IMPLANTS USED:  [employer].     DISPOSITION:  Stable to recovery room with all surgical counts correct.     BRIEF HISTORY OF PRESENT ILLNESS:  Ms. Rooks is a very pleasant 76 year old female with rather intractable  lumbar complaints,  radiculopathy, and claudication complaints, refractory  to conservative care.  I had a long conversation with her preoperatively,  explaining the treatment options including both conservative and surgical  care.  I explained the risks of surgery in great detail.  These risks  include but are certainly not limited to bleeding, infection, nerve damage,  chronic pain, worsening complaints, need for revision procedures in the  future, paralysis, death, and other complications not mentioned above.  Both she and her daughter expressed understanding.  All their questions  were answered to their satisfaction, and they wished for me to proceed on  their behalf.     DESCRIPTION OF PROCEDURE:  The patient was identified in the holding area.  The operative site was  inspected and marked.  Consent was again reviewed in great detail.  She  received mechanical DVT prophylaxis and  prophylactic antibiotics.  She was  transferred to the operating theater where she underwent general  endotracheal anesthetic, as well as having a sterile Foley catheter placed  and was then placed prone on a well-padded Jackson table, ensuring that all  bony  prominences were well-padded and protected.  The lumbar region was  prepped and draped in the  standard fashion.     I used her prior incision as well as intraoperative fluoroscopy to localize  my incision and then exposed the L1 to L5 levels in the standard fashion.  There was significant scarring and adhesions throughout, however, using the  hardware, I was able to localize midline and took great care to expose the  posterior instrumentation without violating or causing any dural injuries.                                                                                                              Page 2 of 4  [name], [name]                                           MRN:          [id number]                                                          Account:      [id number]                                        Document ID:  [id number]                                                   Procedure Date: 10/17/2011                                                                                    I confirmed my localization with intraoperative fluoroscopy.     I first explored the arthrodesis from L3 to L5 and found this to be rigid  and a solid arthrodesis had been attained.     I then exposed the L2 transverse process in the standard fashion while  preserving the L1-L2 facet and exposing the remaining portions of the L1  lamina from the prior decompression.     I then performed revision bilateral laminectomies from L1-L2, L2-L3 and  L3-L4, resecting the rather critical stenosis that was there, both at the  adjacent segment L1-L2 level but the rather significant stenosis within the  prior operative field at L2-L3 and L3-L4.  At  L2-L3, both facets  necessitated resection to adequately decompress this rather critical  stenosis, and as such, I performed transfacet decompressions bilaterally at  this level.  However, at the completion of my decompression, the nerve  roots were free and under no undue tension and there had  been no injury to  the dura or the nerve roots throughout the procedure.     I had a Valsalva maneuver performed to ensure there was no dural tear,  which there was not.     I then irrigated the wound copiously with a dilute solution of Betadine and  then irrigated this out with antibiotic-impregnated saline as well.     I then performed posterior interbody arthrodesis in the standard fashion at  L2-L3 by entering the disk space, removing copious portions of disk  material, preparing the interbody surfaces, and then using sequential trial  interbody devices to determine the appropriate size.  Once selected, I  packed the interbody space with local autograft that had been cleaned of  muscular attachments and then impacted the biomechanical interbody device,  it had excellent hardware position.     Once satisfied with my decompression and posterior interbody arthrodesis, I  turned my attention to the posterolateral fusion.  I decorticated the  posterolateral structures, the fusion mass from L3 to L5 and the L2  transverse process and packed this area with the local bone as well as with  Grafton as autograft extender.  I then performed posterior instrumentation,  segmental, _____ a domino fashion, connecting the L2 to the prior L3 to L5  construct.  Final images confirmed the hardware was in the appropriate  position.  A final Valsalva maneuver confirmed there was no dural tear.  The patient remained stable throughout the procedure.  I then placed a  subfascial Hemovac drain, closed the wound in the standard fashion.  All  surgical counts were correct.  The patient was transferred to the recovery  room in stable condition.                                                                                                           Page 3 of 4  [name], [name]                                           MRN:          [id number]                                                          Account:      [id number]  Document ID:  [id number]                                                   Procedure Date: 10/17/2011                                                                                       I, Nicholas Lose, the attending surgeon, performed the above procedure with  the assistance of Randa Lynn, my PA, who was integral throughout the  procedure, given its complex nature,  but specifically during the  decompression, instrumentation, and arthrodesis portions.           Electronic Signing Provider     D:  10/17/2011 12:18 PM by Dr. Jerlyn Ly. Anselm Lis, MD ([id number])  T:  10/17/2011 12:48 PM by [id number]        cc:                                                                                                           Page 4 of 4  Authenticated by Jerlyn Ly. Anselm Lis, MD ([id number]) On 10/27/2011 06:15:59 PM

## 2011-10-27 NOTE — Discharge Summary (Signed)
ASTI, MACKLEY                                           MRN:          [id number]                                                          Account:      [id number]                                        Document ID:  [id number]                                                                                                                                        Admit Date: 10/17/2011  Discharge Date: 10/20/2011     ATTENDING PHYSICIAN:  Nicholas Lose, MD        PROCEDURE PERFORMED:  Revision lumbar decompression and fusion.     BRIEF HISTORY OF HOSPITAL STAY:  Ms. Craghead is a very pleasant female with significant adjacent segment  stenosis and instability who underwent elective decompression and fusion.  Postoperatively, she did very well, was able tolerate a p.o. diet, p.o.  pain medications, void spontaneously, ambulate without difficulty.  She has  been provided discharge instructions.  All her questions have been answered  to her satisfaction.  I will see her back in my clinic in 2 to 3 weeks  unless, of course, if she has any questions or concerns, I will see her  immediately.           Electronic Signing Provider     D:  10/22/2011 11:26 AM by Dr. Jerlyn Ly. Anselm Lis, MD ([id number])  T:  10/23/2011 06:41 AM by [id number]           cc:                                                                                                           Page 1 of 1  Authenticated by Jerlyn Ly. Anselm Lis, MD ([id number]) On 10/27/2011  06:16:01 PM

## 2011-12-05 ENCOUNTER — Other Ambulatory Visit: Payer: Self-pay | Admitting: Family Medicine

## 2011-12-05 DIAGNOSIS — Z1231 Encounter for screening mammogram for malignant neoplasm of breast: Secondary | ICD-10-CM

## 2011-12-18 ENCOUNTER — Ambulatory Visit
Admission: RE | Admit: 2011-12-18 | Discharge: 2011-12-18 | Disposition: A | Discharge status: Home / Self Care | Payer: Medicare Other | Source: Ambulatory Visit | Attending: Family Medicine | Admitting: Family Medicine

## 2011-12-18 DIAGNOSIS — Z1231 Encounter for screening mammogram for malignant neoplasm of breast: Secondary | ICD-10-CM

## 2012-12-03 ENCOUNTER — Other Ambulatory Visit: Payer: Self-pay | Admitting: Family Medicine

## 2013-01-03 ENCOUNTER — Ambulatory Visit
Admission: RE | Admit: 2013-01-03 | Discharge: 2013-01-03 | Disposition: A | Discharge status: Home / Self Care | Payer: Medicare Other | Source: Ambulatory Visit | Attending: Family Medicine | Admitting: Family Medicine

## 2013-01-03 DIAGNOSIS — Z1231 Encounter for screening mammogram for malignant neoplasm of breast: Secondary | ICD-10-CM

## 2013-01-10 ENCOUNTER — Other Ambulatory Visit: Payer: Self-pay | Admitting: Family Medicine

## 2013-01-10 DIAGNOSIS — R0989 Other specified symptoms and signs involving the circulatory and respiratory systems: Secondary | ICD-10-CM

## 2013-01-15 ENCOUNTER — Ambulatory Visit
Admission: RE | Admit: 2013-01-15 | Discharge: 2013-01-15 | Disposition: A | Discharge status: Home / Self Care | Payer: Medicare Other | Source: Ambulatory Visit | Attending: Family Medicine | Admitting: Family Medicine

## 2013-01-15 DIAGNOSIS — R0989 Other specified symptoms and signs involving the circulatory and respiratory systems: Secondary | ICD-10-CM

## 2013-08-20 ENCOUNTER — Ambulatory Visit: Payer: No Typology Code available for payment source

## 2013-08-20 NOTE — Pre-Procedure Instructions (Addendum)
Cefazolin IV Pre Op  T & C (May be done 08/21/13 in Clinic)    Clinic Day 08/21/13    Dr. Dareen Piano [phone] Cardiac note, EKG, Recent tests

## 2013-08-21 ENCOUNTER — Ambulatory Visit: Payer: No Typology Code available for payment source | Attending: Orthopaedic Surgery

## 2013-08-21 DIAGNOSIS — M1710 Unilateral primary osteoarthritis, unspecified knee: Secondary | ICD-10-CM | POA: Insufficient documentation

## 2013-08-21 DIAGNOSIS — Z8709 Personal history of other diseases of the respiratory system: Secondary | ICD-10-CM | POA: Insufficient documentation

## 2013-08-21 DIAGNOSIS — Z01818 Encounter for other preprocedural examination: Secondary | ICD-10-CM | POA: Insufficient documentation

## 2013-08-21 DIAGNOSIS — D649 Anemia, unspecified: Secondary | ICD-10-CM | POA: Insufficient documentation

## 2013-08-21 LAB — URINALYSIS, REFLEX TO MICROSCOPIC EXAM IF INDICATED
Bilirubin, UA: NEGATIVE
Blood, UA: NEGATIVE
Glucose, UA: NEGATIVE
Ketones UA: NEGATIVE
Nitrite, UA: NEGATIVE
Protein, UR: 30 — AB
Specific Gravity UA: 1.016 (ref 1.001–1.035)
Urine pH: 7 (ref 5.0–8.0)
Urobilinogen, UA: 4 mg/dL — AB (ref 0.2–2.0)

## 2013-08-21 LAB — COMPREHENSIVE METABOLIC PANEL
ALT: 17 U/L (ref 0–55)
AST (SGOT): 19 U/L (ref 5–34)
Albumin/Globulin Ratio: 1.1 (ref 0.9–2.2)
Albumin: 3.6 g/dL (ref 3.5–5.0)
Alkaline Phosphatase: 72 U/L (ref 40–150)
BUN: 15 mg/dL (ref 7–21)
Bilirubin, Total: 0.5 mg/dL (ref 0.2–1.2)
CO2: 27 mEq/L (ref 22–29)
Calcium: 9.6 mg/dL (ref 7.9–10.6)
Chloride: 105 mEq/L (ref 98–107)
Creatinine: 0.7 mg/dL (ref 0.6–1.0)
Globulin: 3.4 g/dL (ref 2.0–3.6)
Glucose: 94 mg/dL (ref 70–100)
Potassium: 3.6 mEq/L (ref 3.5–5.1)
Protein, Total: 7 g/dL (ref 6.0–8.3)
Sodium: 145 mEq/L (ref 136–145)

## 2013-08-21 LAB — CBC
Absolute NRBC: 0
Hematocrit: 36.8 % — ABNORMAL LOW (ref 37.0–47.0)
Hgb: 11.3 g/dL — ABNORMAL LOW (ref 12.0–16.0)
MCH: 26.5 pg — ABNORMAL LOW (ref 28.0–32.0)
MCHC: 30.7 g/dL — ABNORMAL LOW (ref 32.0–36.0)
MCV: 86.4 fL (ref 80.0–100.0)
MPV: 11.2 fL (ref 9.4–12.3)
Nucleated RBC: 0 (ref 0–1)
Platelets: 271 (ref 140–400)
RBC: 4.26 (ref 4.20–5.40)
RDW: 15 % (ref 12–15)
WBC: 8.09 (ref 3.50–10.80)

## 2013-08-21 LAB — ECG 12-LEAD
Atrial Rate: 62 {beats}/min
P Axis: 66 degrees
P-R Interval: 130 ms
Q-T Interval: 434 ms
QRS Duration: 88 ms
QTC Calculation (Bezet): 440 ms
R Axis: 69 degrees
T Axis: 53 degrees
Ventricular Rate: 62 {beats}/min

## 2013-08-21 LAB — APTT: PTT: 29 (ref 23–37)

## 2013-08-21 LAB — TYPE AND SCREEN
AB Screen Gel: NEGATIVE
ABO Rh: B POS

## 2013-08-21 LAB — GFR: EGFR: >60

## 2013-08-21 LAB — PT/INR
PT INR: 1 (ref 0.9–1.1)
PT: 13.4 (ref 12.6–15.0)

## 2013-08-21 NOTE — PT/OT Therapy Note (Signed)
[facility]  [address]  [place] [address]  [phone]    Physical Therapy Pre-Operative Intake Form    Patient: [name] MRN: [id number]   Unit: [facility]     Precautions  Total Knee Replacement:  (RIGHT on 08/25/13 by NG, MD)    Social History   Prior Level of Function  Prior level of function: Ambulates with assistive device  Assistive Device: Front wheel walker (rollator)  Baseline Activity Level: Community ambulation  DME Currently at Home: Four wheel walker;Single point cane;Crutches    Home Living Arrangements  Living Arrangements: Children (Debra: dtr/coach)  Home Layout:  (3 STE with rail, chair lift to bedroom level)  Bathroom Shower/Tub: Pension scheme manager: Raised  Bathroom Equipment: Built-in shower seat  DME Currently at Home: Four wheel walker;Single point cane;Crutches    Subjective   : Patient is agreeable to participation in the therapy session. Family and/or guardian are agreeable to patient's participation in the therapy session.  Patient Goal  Patient Goal:  (dancing)    Pain Assessment  Patient's Stated Comfort Functional Goal: 3-mild pain     Functional Limits  Pain Level Best:  (0)  Pain Level Worst: 10    Observation of patient:                Locomotion  Pattern:  (fwd trunk, dec cadence, varus knee)         Reviewed pre-operative exercises provided by MD office, post-operative therapy plans.      Plan      Initiate post-op Physical Therapy Evaluation per post-op MD orders.       Signature: Mathews Argyle, PT 08/21/2013 10:17 AM

## 2013-08-21 NOTE — Consults (Signed)
CONSULTATION    Date Time:08/21/2013 11:47 AM  Patient Name: [name]  Requesting Physician: Lane Hacker      Reason for Consultation:   Medical clearance for surgery.      History:   [name] is a 77 y.o. female who presents for preop evaluation. The patient is scheduled for an elective Right TKA for worsening joint pain. She feels well overall without any complaints. METS score>4.    Chief Complaint:   No chief complaint on file.      Problem List:   There is no problem list on file for this patient.      Past Medical History:     Past Medical History   Diagnosis Date   . Hypertension    . Pneumonia    . Chronic obstructive pulmonary disease    . Headache(784.0)      not since surgery in 1957   . Arthritis    . Difficulty in walking(719.7)    . Low back pain        Past Surgical History:     Past Surgical History   Procedure Date   . Cervical spine surgery    . Lumbar spine surgery posterior w/ hardware   . Tonsillectomy    . Brain surgery R side/ 1957 Optic nerve pressure   . Eye surgery      cataracts   . Colonoscopy    . Tubal ligation 1961       Family History:   No family history on file.    Social History:     History     Social History   . Marital Status: Widowed     Spouse Name: N/A     Number of Children: N/A   . Years of Education: N/A     Occupational History   . Not on file.     Social History Main Topics   . Smoking status: Former Smoker -- 1.0 packs/day for 45 years     Quit date: 08/20/1998   . Smokeless tobacco: Never Used   . Alcohol Use: Yes      Comment: occassionally   . Drug Use: No   . Sexually Active:      Other Topics Concern   . Not on file     Social History Narrative   . No narrative on file       Allergies:   No Known Allergies    Medications:     Prior to Admission medications    Medication Sig Start Date End Date Taking? Authorizing Provider   amLODIPine (NORVASC) 10 MG tablet Take 5 mg by mouth daily.    [provider]   aspirin 81 MG tablet Take 81 mg by mouth  daily.    [provider]   cloNIDine (CATAPRES) 0.1 MG tablet Take 0.1 mg by mouth daily.    [provider]   gabapentin (NEURONTIN) 300 MG capsule Take 300 mg by mouth 2 (two) times daily.     [provider]   hydrochlorothiazide (HYDRODIURIL) 25 MG tablet Take 25 mg by mouth daily.    [provider]   ibuprofen (ADVIL,MOTRIN) 800 MG tablet Take 800 mg by mouth 2 (two) times daily.    [provider]   loratadine (CLARITIN) 10 MG tablet Take 10 mg by mouth as needed.    [provider]   Multiple Vitamin (MULTIVITAMIN) tablet Take 1 tablet by mouth daily.  [provider]   nebivolol (BYSTOLIC) 5 MG tablet Take 5 mg by mouth daily.    [provider]   Omega-3 Fatty Acids (OMEGA 3 PO) Take by mouth 2 (two) times daily.    [provider]   potassium chloride 8 MEQ Cap CR Take 8 mEq by mouth once.    [provider]   tiotropium (SPIRIVA) 18 MCG inhalation capsule Place 18 mcg into inhaler and inhale daily.    [provider]       Review of Systems:   A comprehensive review of systems was obtained from chart review and the patient.    General ROS: negative for - malaise and fatigue  Psychological ROS: negative for - disorientation or suicidal ideation  Ophthalmic ROS: negative for - blurry vision  ENT ROS: negative for - headaches  Allergy and Immunology ROS: negative  Hematological and Lymphatic ROS: negative for - bleeding problems  Endocrine ROS: negative for - malaise/lethargy  Respiratory ROS: no cough, shortness of breath, or wheezing  Cardiovascular ROS: no chest pain or dyspnea on exertion  Gastrointestinal ROS: no abdominal pain, change in bowel habits, or black or bloody stools  Genito-Urinary ROS: no dysuria, trouble voiding, or hematuria  Musculoskeletal ROS: positive for - joint pain  Neurological ROS: no TIA or stroke symptoms      Physical Exam:     Filed Vitals:    08/21/13 1047   BP: 190/85    Pulse: 63       General appearance - alert, well appearing, and in no distress  Mental status - alert, oriented to person, place, and time, affect appropriate to mood  Eyes - pupils equal and reactive, sclera anicteric. No pallor.  Ears - right ear normal, left ear normal  Nose - normal, no discharge  Mouth - mucous membranes moist,   Neck - supple, no significant adenopathy  Chest - clear to auscultation, no wheezes, rales or rhonchi, symmetric air entry  Heart - normal rate, regular rhythm, normal S1, S2, no murmurs, rubs, or gallops  Abdomen - soft, nontender, nondistended, no organomegaly  Neurological - alert, oriented, normal speech, no focal findings  Musculoskeletal - Right Knee OA changes with tenderness and painful ROM  Extremities - peripheral pulses normal, no pedal edema    Labs:     Results     Procedure Component Value Units Date/Time    CBC [[id number]]  (Abnormal) Collected:08/21/13 1017    Specimen Information:Blood / Blood Updated:08/21/13 1104     WBC 8.09      RBC 4.26      Hgb 11.3 (L) g/dL      Hematocrit 86.5 (L) %      MCV 86.4 fL      MCH 26.5 (L) pg      MCHC 30.7 (L) g/dL      RDW 15 %      Platelets 271      MPV 11.2 fL      Nucleated RBC 0      Absolute NRBC 0.00     Culture Staph aureus and MRSA Surveillan [[id number]] Collected:08/21/13 1000    Specimen Information:Throat Updated:08/21/13 1101    Culture Staph aureus and MRSA Surveillan [[id number]] Collected:08/21/13 1000    Specimen Information:Nares Updated:08/21/13 1101    Comprehensive metabolic panel [[id number]] Collected:08/21/13 1017    Specimen Information:Blood Updated:08/21/13 1046     Glucose 94 mg/dL      BUN 15 mg/dL  Creatinine 0.7 mg/dL      Sodium 951 mEq/L      Potassium 3.6 mEq/L      Chloride 105 mEq/L      CO2 27 mEq/L      CALCIUM 9.6 mg/dL      Protein, Total 7.0 g/dL      Albumin 3.6 g/dL      AST (SGOT) 19 U/L      ALT 17 U/L      Alkaline Phosphatase 72 U/L      Bilirubin, Total 0.5 mg/dL      Globulin 3.4  g/dL      Albumin/Globulin Ratio 1.1     GFR [[id number]] Collected:08/21/13 1017     EGFR >60.0 Updated:08/21/13 1046    UA, Reflex to Microscopic [[id number]]  (Abnormal) Collected:08/21/13 1017     Urine Type Random Updated:08/21/13 1035     Color, UA Yellow      Clarity, UA Sl Cloudy (A)      Specific Gravity UA 1.016      Urine pH 7.0      Leukocyte Esterase, UA Trace (A)      Nitrite, UA Negative      Protein, UR 30 (A)      Glucose, UA Negative      Ketones UA Negative      Urobilinogen, UA 4.0 (A) mg/dL      Bilirubin, UA Negative      Blood, UA Negative      RBC, UA 0 - 5      WBC, UA 0 - 5      Squamous Epithelial Cells, Urine 0 - 5     APTT [[id number]] Collected:08/21/13 1017     PTT 29 Updated:08/21/13 1035    Protime-INR [[id number]] Collected:08/21/13 1017    Specimen Information:Blood Updated:08/21/13 1035     PT 13.4      PT INR 1.0      PT Anticoag. Given Within 48 hrs. Other:  Specify     Type and Screen [[id number]] Collected:08/21/13 1017    Specimen Information:Blood Updated:08/21/13 1017          Rads:     Radiology Results (24 Hour)     ** No Results found for the last 24 hours. **          EKG: Normal.    Assessment/Plan:   Pre-operative Evaluation.  Osteoarthritis of  Right Knee.  History of COPD. Continue outpatient medications.  Hypertension - Hold diuretic on day of surgery and post-operative, hold blood pressure medicine for SBP<110.  Mild Anemia - give iron after surgery.   No ASA/NSAID's, OTC Meds 10 days prior to surgery as per preop instructions.DVT and GI prophylaxis per orthopedics. Med Rec completed for post-op today and discussed with patient. Pt is medically cleared. Will follow patient post-operatively. Thank you for consultation.    Additional Recommendations:   CBC/BMP POD #1      Signed HY:WVPX Hilton Sinclair, MD

## 2013-08-24 NOTE — Anesthesia Preprocedure Evaluation (Addendum)
Anesthesia Evaluation    AIRWAY    Mallampati: III    TM distance: >3 FB  Neck ROM: limited  Mouth Opening:full   CARDIOVASCULAR    cardiovascular exam normal       DENTAL    No notable dental hx     PULMONARY    pulmonary exam normal     OTHER FINDINGS    nebivolol (BYSTOLIC    Morbid Obesity    S/p CERVICAL SPINE SURGERY LUMBAR SPINE FUSION X 2 SURGERY     Hypertension    Chronic obstructive pulmonary disease     Cbc, bmp, coag, ecg reviewed.  Medically cleared for surgery.                 Anesthesia Plan    ASA 3     spinal                             Post op pain management: per surgeon    informed consent obtained

## 2013-08-25 ENCOUNTER — Inpatient Hospital Stay: Payer: No Typology Code available for payment source | Admitting: Pain Medicine

## 2013-08-25 ENCOUNTER — Inpatient Hospital Stay: Payer: No Typology Code available for payment source

## 2013-08-25 ENCOUNTER — Encounter
Admission: RE | Disposition: A | Discharge status: Home Health | Payer: Self-pay | Source: Ambulatory Visit | Attending: Orthopaedic Surgery

## 2013-08-25 ENCOUNTER — Inpatient Hospital Stay
Admission: RE | Admit: 2013-08-25 | Discharge: 2013-08-26 | DRG: 470 | Disposition: A | Discharge status: Home Health | Payer: No Typology Code available for payment source | Source: Ambulatory Visit | Attending: Orthopaedic Surgery | Admitting: Orthopaedic Surgery

## 2013-08-25 ENCOUNTER — Inpatient Hospital Stay: Payer: No Typology Code available for payment source | Admitting: Orthopaedic Surgery

## 2013-08-25 ENCOUNTER — Encounter: Payer: Self-pay | Admitting: Pain Medicine

## 2013-08-25 DIAGNOSIS — Z87891 Personal history of nicotine dependence: Secondary | ICD-10-CM

## 2013-08-25 DIAGNOSIS — I10 Essential (primary) hypertension: Secondary | ICD-10-CM | POA: Diagnosis present

## 2013-08-25 DIAGNOSIS — R262 Difficulty in walking, not elsewhere classified: Secondary | ICD-10-CM | POA: Diagnosis present

## 2013-08-25 DIAGNOSIS — D649 Anemia, unspecified: Secondary | ICD-10-CM | POA: Diagnosis present

## 2013-08-25 DIAGNOSIS — M1710 Unilateral primary osteoarthritis, unspecified knee: Principal | ICD-10-CM | POA: Diagnosis present

## 2013-08-25 DIAGNOSIS — Z981 Arthrodesis status: Secondary | ICD-10-CM

## 2013-08-25 DIAGNOSIS — Z6839 Body mass index (BMI) 39.0-39.9, adult: Secondary | ICD-10-CM

## 2013-08-25 DIAGNOSIS — J4489 Other specified chronic obstructive pulmonary disease: Secondary | ICD-10-CM | POA: Diagnosis present

## 2013-08-25 HISTORY — DX: Headache: R51

## 2013-08-25 HISTORY — DX: Low back pain, unspecified: M54.50

## 2013-08-25 HISTORY — DX: Chronic obstructive pulmonary disease, unspecified: J44.9

## 2013-08-25 HISTORY — DX: Unspecified osteoarthritis, unspecified site: M19.90

## 2013-08-25 HISTORY — DX: Pneumonia, unspecified organism: J18.9

## 2013-08-25 HISTORY — PX: ARTHROPLASTY, KNEE, TOTAL: SHX3134

## 2013-08-25 HISTORY — DX: Essential (primary) hypertension: I10

## 2013-08-25 HISTORY — DX: Difficulty in walking, not elsewhere classified: R26.2

## 2013-08-25 SURGERY — ARTHROPLASTY, KNEE, TOTAL
Anesthesia: Regional | Site: Knee | Laterality: Right | Wound class: Clean

## 2013-08-25 MED ORDER — TRANEXAMIC ACID 100 MG/ML IV SOLN
1000.0000 mg | Freq: Once | INTRAVENOUS | Status: AC
Start: 2013-08-25 — End: 2013-08-25
  Administered 2013-08-25: 1000 mg via INTRAVENOUS
  Filled 2013-08-25: qty 10

## 2013-08-25 MED ORDER — CELECOXIB 200 MG PO CAPS
400.0000 mg | ORAL_CAPSULE | Freq: Once | ORAL | Status: AC
Start: 2013-08-25 — End: 2013-08-25
  Administered 2013-08-25: 400 mg via ORAL

## 2013-08-25 MED ORDER — BUPIVACAINE HCL (PF) 0.5 % IJ SOLN
INTRAMUSCULAR | Status: AC
Start: 2013-08-25 — End: ?
  Filled 2013-08-25: qty 30

## 2013-08-25 MED ORDER — SODIUM CHLORIDE 0.9 % IV SOLN
1000.0000 mg | INTRAVENOUS | Status: DC
Start: 2013-08-25 — End: 2013-08-25
  Filled 2013-08-25: qty 10

## 2013-08-25 MED ORDER — TRAMADOL HCL 50 MG PO TABS
50.0000 mg | ORAL_TABLET | Freq: Four times a day (QID) | ORAL | Status: DC
Start: 2013-08-25 — End: 2013-08-26
  Administered 2013-08-25 – 2013-08-26 (×5): 50 mg via ORAL
  Filled 2013-08-25 (×5): qty 1

## 2013-08-25 MED ORDER — CEFAZOLIN SODIUM 1 G IJ SOLR
INTRAMUSCULAR | Status: DC | PRN
Start: 2013-08-25 — End: 2013-08-25
  Administered 2013-08-25: 2 g via INTRAVENOUS

## 2013-08-25 MED ORDER — ACETAMINOPHEN 500 MG PO TABS
1000.0000 mg | ORAL_TABLET | Freq: Once | ORAL | Status: AC
Start: 2013-08-25 — End: 2013-08-25
  Administered 2013-08-25: 1000 mg via ORAL

## 2013-08-25 MED ORDER — LACTATED RINGERS IV SOLN
INTRAVENOUS | Status: DC | PRN
Start: 2013-08-25 — End: 2013-08-25

## 2013-08-25 MED ORDER — ZOLPIDEM TARTRATE 5 MG PO TABS
5.0000 mg | ORAL_TABLET | Freq: Every evening | ORAL | Status: DC | PRN
Start: 2013-08-25 — End: 2013-08-26

## 2013-08-25 MED ORDER — CLONIDINE HCL 0.1 MG PO TABS
0.1000 mg | ORAL_TABLET | Freq: Every evening | ORAL | Status: DC
Start: 2013-08-25 — End: 2013-08-26
  Administered 2013-08-25: 0.1 mg via ORAL
  Filled 2013-08-25 (×2): qty 1

## 2013-08-25 MED ORDER — SODIUM CHLORIDE 0.9 % IV SOLN
1000.0000 mg | INTRAVENOUS | Status: DC | PRN
Start: 2013-08-25 — End: 2013-08-25
  Administered 2013-08-25: 1000 mg via INTRAVENOUS

## 2013-08-25 MED ORDER — HYDROCODONE-ACETAMINOPHEN 5-325 MG PO TABS
2.0000 | ORAL_TABLET | ORAL | Status: DC | PRN
Start: 2013-08-25 — End: 2013-08-26
  Administered 2013-08-25 – 2013-08-26 (×2): 2 via ORAL
  Filled 2013-08-25 (×2): qty 2

## 2013-08-25 MED ORDER — BUPIVACAINE 0.5 % ON-Q PUMP 330 ML (SIMPLE)
5.0000 mL/h | PERCUTANEOUS | Status: DC
Start: 2013-08-25 — End: 2013-08-26
  Administered 2013-08-25: 5 mL/h via PERCUTANEOUS

## 2013-08-25 MED ORDER — ONDANSETRON HCL 4 MG/2ML IJ SOLN
INTRAMUSCULAR | Status: DC | PRN
Start: 2013-08-25 — End: 2013-08-25
  Administered 2013-08-25: 4 mg via INTRAVENOUS

## 2013-08-25 MED ORDER — HEPARIN SODIUM (PORCINE) 1000 UNIT/ML IJ SOLN
INTRAMUSCULAR | Status: AC
Start: 2013-08-25 — End: ?
  Filled 2013-08-25: qty 1

## 2013-08-25 MED ORDER — ACETAMINOPHEN 325 MG PO TABS
ORAL_TABLET | ORAL | Status: AC
Start: 2013-08-25 — End: 2013-08-25
  Administered 2013-08-25: 650 mg via ORAL
  Filled 2013-08-25: qty 2

## 2013-08-25 MED ORDER — MIDAZOLAM HCL 2 MG/2ML IJ SOLN
INTRAMUSCULAR | Status: DC | PRN
Start: 2013-08-25 — End: 2013-08-25
  Administered 2013-08-25: 2 mg via INTRAVENOUS

## 2013-08-25 MED ORDER — CEFAZOLIN SODIUM-DEXTROSE 2-3 GM-% IV SOLR
INTRAVENOUS | Status: AC
Start: 2013-08-25 — End: ?
  Filled 2013-08-25: qty 50

## 2013-08-25 MED ORDER — GLYCOPYRROLATE 0.2 MG/ML IJ SOLN
INTRAMUSCULAR | Status: AC
Start: 2013-08-25 — End: ?
  Filled 2013-08-25: qty 1

## 2013-08-25 MED ORDER — PREGABALIN 75 MG PO CAPS
75.0000 mg | ORAL_CAPSULE | Freq: Two times a day (BID) | ORAL | Status: DC
Start: 2013-08-25 — End: 2013-08-25

## 2013-08-25 MED ORDER — BACITRACIN 50000 UNITS IM SOLR
INTRAMUSCULAR | Status: AC
Start: 2013-08-25 — End: ?
  Filled 2013-08-25: qty 100000

## 2013-08-25 MED ORDER — ROPIVACAINE HCL 5 MG/ML IJ SOLN
INTRAMUSCULAR | Status: AC
Start: 2013-08-25 — End: ?
  Filled 2013-08-25: qty 30

## 2013-08-25 MED ORDER — AMLODIPINE BESYLATE 5 MG PO TABS
5.0000 mg | ORAL_TABLET | Freq: Every day | ORAL | Status: DC
Start: 2013-08-26 — End: 2013-08-26

## 2013-08-25 MED ORDER — DEXAMETHASONE SODIUM PHOSPHATE 4 MG/ML IJ SOLN (WRAP)
INTRAMUSCULAR | Status: DC | PRN
Start: 2013-08-25 — End: 2013-08-25
  Administered 2013-08-25: 10 mg via INTRAVENOUS

## 2013-08-25 MED ORDER — MEPERIDINE HCL 25 MG/ML IJ SOLN
25.0000 mg | INTRAMUSCULAR | Status: DC | PRN
Start: 2013-08-25 — End: 2013-08-25

## 2013-08-25 MED ORDER — KETOROLAC TROMETHAMINE 30 MG/ML IJ SOLN
30.0000 mg | Freq: Once | INTRAMUSCULAR | Status: DC
Start: 2013-08-25 — End: 2013-08-25

## 2013-08-25 MED ORDER — TIOTROPIUM BROMIDE MONOHYDRATE 18 MCG IN CAPS
18.0000 ug | ORAL_CAPSULE | Freq: Every morning | RESPIRATORY_TRACT | Status: DC
Start: 2013-08-26 — End: 2013-08-26
  Filled 2013-08-25: qty 5

## 2013-08-25 MED ORDER — ACETAMINOPHEN 500 MG PO TABS
ORAL_TABLET | ORAL | Status: AC
Start: 2013-08-25 — End: ?
  Filled 2013-08-25: qty 2

## 2013-08-25 MED ORDER — CLONIDINE HCL 0.1 MG PO TABS
0.1000 mg | ORAL_TABLET | Freq: Every day | ORAL | Status: DC
Start: 2013-08-25 — End: 2013-08-25
  Filled 2013-08-25: qty 1

## 2013-08-25 MED ORDER — SCOPOLAMINE 1 MG/3DAYS TD PT72
MEDICATED_PATCH | TRANSDERMAL | Status: AC
Start: 2013-08-25 — End: ?
  Filled 2013-08-25: qty 1

## 2013-08-25 MED ORDER — ONDANSETRON HCL 4 MG/2ML IJ SOLN
4.0000 mg | Freq: Three times a day (TID) | INTRAMUSCULAR | Status: DC | PRN
Start: 2013-08-25 — End: 2013-08-26

## 2013-08-25 MED ORDER — GLYCOPYRROLATE 0.2 MG/ML IJ SOLN
INTRAMUSCULAR | Status: DC | PRN
Start: 2013-08-25 — End: 2013-08-25
  Administered 2013-08-25: 0.2 mg via INTRAVENOUS

## 2013-08-25 MED ORDER — BETAXOLOL HCL 0.5 % OP SOLN
OPHTHALMIC | Status: DC | PRN
Start: 2013-08-25 — End: 2013-08-25
  Administered 2013-08-25: 2 [drp] via OPHTHALMIC

## 2013-08-25 MED ORDER — SCOPOLAMINE 1 MG/3DAYS TD PT72
1.0000 | MEDICATED_PATCH | Freq: Once | TRANSDERMAL | Status: DC
Start: 2013-08-25 — End: 2013-08-25
  Administered 2013-08-25: 1 via TRANSDERMAL

## 2013-08-25 MED ORDER — HYDROMORPHONE HCL PF 1 MG/ML IJ SOLN
0.5000 mg | INTRAMUSCULAR | Status: DC | PRN
Start: 2013-08-25 — End: 2013-08-25

## 2013-08-25 MED ORDER — PREGABALIN 75 MG PO CAPS
75.0000 mg | ORAL_CAPSULE | Freq: Once | ORAL | Status: AC
Start: 2013-08-25 — End: 2013-08-25
  Administered 2013-08-25: 75 mg via ORAL

## 2013-08-25 MED ORDER — METOCLOPRAMIDE HCL 5 MG/ML IJ SOLN
INTRAMUSCULAR | Status: DC | PRN
Start: 2013-08-25 — End: 2013-08-25
  Administered 2013-08-25: 10 mg via INTRAVENOUS

## 2013-08-25 MED ORDER — SODIUM CHLORIDE 0.9 % IR SOLN
100000.0000 [IU] | Status: DC | PRN
Start: 2013-08-25 — End: 2013-08-25
  Administered 2013-08-25: 100000 [IU]

## 2013-08-25 MED ORDER — FAMOTIDINE 20 MG/2ML IV SOLN
INTRAVENOUS | Status: AC
Start: 2013-08-25 — End: ?
  Filled 2013-08-25: qty 2

## 2013-08-25 MED ORDER — WARFARIN SODIUM 2.5 MG PO TABS
2.5000 mg | ORAL_TABLET | Freq: Once | ORAL | Status: AC
Start: 2013-08-25 — End: 2013-08-25
  Administered 2013-08-25: 2.5 mg via ORAL
  Filled 2013-08-25: qty 1

## 2013-08-25 MED ORDER — SODIUM CHLORIDE 0.9 % IV SOLN
INTRAVENOUS | Status: DC
Start: 2013-08-25 — End: 2013-08-26

## 2013-08-25 MED ORDER — NEBIVOLOL HCL 5 MG PO TABS
5.0000 mg | ORAL_TABLET | Freq: Every evening | ORAL | Status: DC
Start: 2013-08-25 — End: 2013-08-26
  Administered 2013-08-25: 5 mg via ORAL

## 2013-08-25 MED ORDER — LIDOCAINE HCL (PF) 2 % IJ SOLN
INTRAMUSCULAR | Status: AC
Start: 2013-08-25 — End: ?
  Filled 2013-08-25: qty 5

## 2013-08-25 MED ORDER — OXYCODONE HCL ER 10 MG PO T12A
10.0000 mg | EXTENDED_RELEASE_TABLET | Freq: Once | ORAL | Status: AC
Start: 2013-08-25 — End: 2013-08-25
  Administered 2013-08-25: 10 mg via ORAL

## 2013-08-25 MED ORDER — HYDROMORPHONE HCL PF 1 MG/ML IJ SOLN
0.5000 mg | INTRAMUSCULAR | Status: DC | PRN
Start: 2013-08-25 — End: 2013-08-26

## 2013-08-25 MED ORDER — PROPOFOL INFUSION 10 MG/ML
INTRAVENOUS | Status: DC | PRN
Start: 2013-08-25 — End: 2013-08-25
  Administered 2013-08-25: 77 ug/kg/min via INTRAVENOUS

## 2013-08-25 MED ORDER — HYDROXYZINE PAMOATE 25 MG PO CAPS
25.0000 mg | ORAL_CAPSULE | Freq: Two times a day (BID) | ORAL | Status: DC | PRN
Start: 2013-08-25 — End: 2013-08-26

## 2013-08-25 MED ORDER — BUPIVACAINE HCL (PF) 0.5 % IJ SOLN
INTRAMUSCULAR | Status: DC | PRN
Start: 2013-08-25 — End: 2013-08-25
  Administered 2013-08-25: 15 mg via INTRATHECAL

## 2013-08-25 MED ORDER — SODIUM CHLORIDE 0.9 % IV MBP
1.0000 g | Freq: Three times a day (TID) | INTRAVENOUS | Status: AC
Start: 2013-08-25 — End: 2013-08-26
  Administered 2013-08-25 – 2013-08-26 (×2): 1 g via INTRAVENOUS
  Filled 2013-08-25 (×2): qty 1000

## 2013-08-25 MED ORDER — FAMOTIDINE 10 MG/ML IV SOLN (WRAP)
INTRAVENOUS | Status: DC | PRN
Start: 2013-08-25 — End: 2013-08-25
  Administered 2013-08-25: 20 mg via INTRAVENOUS

## 2013-08-25 MED ORDER — EPHEDRINE SULFATE 50 MG/ML IJ SOLN
INTRAMUSCULAR | Status: DC | PRN
Start: 2013-08-25 — End: 2013-08-25
  Administered 2013-08-25: 10 mg via INTRAVENOUS

## 2013-08-25 MED ORDER — HYDROCODONE-ACETAMINOPHEN 5-325 MG PO TABS
1.0000 | ORAL_TABLET | ORAL | Status: DC | PRN
Start: 2013-08-25 — End: 2013-08-26

## 2013-08-25 MED ORDER — SENNOSIDES-DOCUSATE SODIUM 8.6-50 MG PO TABS
2.0000 | ORAL_TABLET | Freq: Two times a day (BID) | ORAL | Status: DC | PRN
Start: 2013-08-25 — End: 2013-08-26

## 2013-08-25 MED ORDER — DEXAMETHASONE SODIUM PHOSPHATE 10 MG/ML IJ SOLN
INTRAMUSCULAR | Status: AC
Start: 2013-08-25 — End: ?
  Filled 2013-08-25: qty 1

## 2013-08-25 MED ORDER — KETOROLAC TROMETHAMINE 30 MG/ML IJ SOLN
15.0000 mg | Freq: Once | INTRAMUSCULAR | Status: AC
Start: 2013-08-25 — End: 2013-08-25
  Administered 2013-08-25: 15 mg via INTRAVENOUS
  Filled 2013-08-25: qty 1

## 2013-08-25 MED ORDER — LACTATED RINGERS IV SOLN
INTRAVENOUS | Status: DC
Start: 2013-08-25 — End: 2013-08-26

## 2013-08-25 MED ORDER — CELECOXIB 200 MG PO CAPS
ORAL_CAPSULE | ORAL | Status: AC
Start: 2013-08-25 — End: ?
  Filled 2013-08-25: qty 2

## 2013-08-25 MED ORDER — PROMETHAZINE HCL 25 MG/ML IJ SOLN
6.2500 mg | Freq: Once | INTRAMUSCULAR | Status: DC | PRN
Start: 2013-08-25 — End: 2013-08-25

## 2013-08-25 MED ORDER — ACETAMINOPHEN 325 MG PO TABS
650.0000 mg | ORAL_TABLET | ORAL | Status: DC
Start: 2013-08-25 — End: 2013-08-25

## 2013-08-25 MED ORDER — HYDROCODONE-ACETAMINOPHEN 5-325 MG PO TABS
2.0000 | ORAL_TABLET | Freq: Four times a day (QID) | ORAL | Status: DC | PRN
Start: 2013-08-25 — End: 2013-08-26
  Administered 2013-08-25: 2 via ORAL

## 2013-08-25 MED ORDER — CEFAZOLIN SODIUM-DEXTROSE 2-3 GM-% IV SOLR
2000.0000 mg | INTRAVENOUS | Status: DC
Start: 2013-08-25 — End: 2013-08-25

## 2013-08-25 MED ORDER — DOCUSATE SODIUM 100 MG PO CAPS
100.0000 mg | ORAL_CAPSULE | Freq: Two times a day (BID) | ORAL | Status: DC
Start: 2013-08-25 — End: 2013-08-26
  Administered 2013-08-25 – 2013-08-26 (×2): 100 mg via ORAL
  Filled 2013-08-25 (×2): qty 1

## 2013-08-25 MED ORDER — HEPARIN SODIUM (PORCINE) 1000 UNIT/ML IJ SOLN
INTRAMUSCULAR | Status: DC | PRN
Start: 2013-08-25 — End: 2013-08-25
  Administered 2013-08-25: 1000 [IU] via INTRAVENOUS

## 2013-08-25 MED ORDER — OXYCODONE HCL ER 10 MG PO T12A
EXTENDED_RELEASE_TABLET | ORAL | Status: AC
Start: 2013-08-25 — End: ?
  Filled 2013-08-25: qty 1

## 2013-08-25 MED ORDER — MIDAZOLAM HCL 2 MG/2ML IJ SOLN
INTRAMUSCULAR | Status: AC
Start: 2013-08-25 — End: ?
  Filled 2013-08-25: qty 4

## 2013-08-25 MED ORDER — ASPIRIN 325 MG PO TBEC
325.0000 mg | DELAYED_RELEASE_TABLET | Freq: Every day | ORAL | Status: DC
Start: 2013-08-25 — End: 2013-08-25

## 2013-08-25 MED ORDER — METOCLOPRAMIDE HCL 5 MG/ML IJ SOLN
INTRAMUSCULAR | Status: AC
Start: 2013-08-25 — End: ?
  Filled 2013-08-25: qty 2

## 2013-08-25 MED ORDER — DIPHENHYDRAMINE HCL 50 MG/ML IJ SOLN
6.2500 mg | Freq: Four times a day (QID) | INTRAMUSCULAR | Status: DC | PRN
Start: 2013-08-25 — End: 2013-08-25

## 2013-08-25 MED ORDER — EPHEDRINE SULFATE 50 MG/ML IJ SOLN
INTRAMUSCULAR | Status: AC
Start: 2013-08-25 — End: ?
  Filled 2013-08-25: qty 1

## 2013-08-25 MED ORDER — FENTANYL CITRATE 0.05 MG/ML IJ SOLN
50.0000 ug | INTRAMUSCULAR | Status: DC | PRN
Start: 2013-08-25 — End: 2013-08-25

## 2013-08-25 MED ORDER — HYDROCODONE-ACETAMINOPHEN 5-325 MG PO TABS
1.0000 | ORAL_TABLET | Freq: Once | ORAL | Status: DC | PRN
Start: 2013-08-25 — End: 2013-08-25

## 2013-08-25 MED ORDER — POVIDONE-IODINE 5 % OP SOLN
OPHTHALMIC | Status: AC
Start: 2013-08-25 — End: ?
  Filled 2013-08-25: qty 30

## 2013-08-25 MED ORDER — ONDANSETRON HCL 4 MG/2ML IJ SOLN
INTRAMUSCULAR | Status: AC
Start: 2013-08-25 — End: ?
  Filled 2013-08-25: qty 2

## 2013-08-25 MED ORDER — PREGABALIN 75 MG PO CAPS
ORAL_CAPSULE | ORAL | Status: AC
Start: 2013-08-25 — End: ?
  Filled 2013-08-25: qty 1

## 2013-08-25 MED ORDER — PROPOFOL 10 MG/ML IV EMUL
INTRAVENOUS | Status: AC
Start: 2013-08-25 — End: ?
  Filled 2013-08-25: qty 50

## 2013-08-25 MED ORDER — GABAPENTIN 300 MG PO CAPS
300.0000 mg | ORAL_CAPSULE | Freq: Two times a day (BID) | ORAL | Status: DC
Start: 2013-08-25 — End: 2013-08-26
  Administered 2013-08-25 – 2013-08-26 (×2): 300 mg via ORAL
  Filled 2013-08-25 (×2): qty 1

## 2013-08-25 SURGICAL SUPPLY — 104 items
ADHESIVE SKIN SURGISEAL .35ML (Suture) ×2 IMPLANT
APPLCATOR CHLORAPREP 26ML (Prep) ×4 IMPLANT
BANDAGE CMPR VLCR PLSTR CTTN MED MTRX 15 (Bandage) ×1
BANDAGE MEDIUM COMPRESSION L15 YD X W6 IN ELASTIC VELCRO POLYESTER (Bandage) ×1 IMPLANT
BANDAGE MEDLINE MEDIUM COMPRESSION L15 (Bandage) ×1
BASEPLATE TIB 3 TRTHLN STRL CMNT PRM KN (Base) ×1 IMPLANT
BASEPLATE TIBIAL 3 KNEE CEMENT PRIMARY (Base) ×1 IMPLANT
BASEPLATE TIBIAL 3 KNEE CEMENT PRIMARY TRIATHLON (Base) ×1 IMPLANT
BLADE .035 CUT THICK 90.0X9.5M (Blade) IMPLANT
BLADE BRASSLER (Blade) ×2 IMPLANT
BOWL CEMENT GUN BATCH W NOZZLE (Ortho Supply) ×2 IMPLANT
CEMENT BN TBR SMPX P STRL FD RADOPQ (Cement) ×2 IMPLANT
CEMENT BONE RADIOPAQUE FULL DOSE SIMPLEX (Cement) ×2 IMPLANT
CEMENT BONE RADIOPAQUE FULL DOSE SIMPLEX P TOBRAMYCIN (Cement) ×2 IMPLANT
COMPONENT FEM 3 TRTHLN LF STRL CRCTE RTN (Femoral) ×1 IMPLANT
COMPONENT FEMORAL 3 KNEE RIGHT CRUCIATE RETAIN CEMENT TRIATHLON (Femoral) ×1 IMPLANT
COMPONENT FEMORAL 3 KNEE RT CRUCIATE (Femoral) ×1 IMPLANT
COMPONENT PATELLAR H9 MM OD31 MM (Patella) ×1 IMPLANT
COMPONENT PATELLAR H9 MM OD31 MM SYMMETRIC TRIATHLON X3 KNEE (Patella) ×1 IMPLANT
DIFFUSER AIR (Procedure Accessories) ×2 IMPLANT
DRAPE SRG PLS U STRDRP 51X47IN LF STRL (Drape) ×1
DRAPE STERI LARGE W/TOWEL (Drape) ×2 IMPLANT
DRAPE SURGICAL ADHESIVE L51 IN X W47 IN (Drape) ×1
DRAPE SURGICAL ADHESIVE L51 IN X W47 IN STERI-DRAPE CLEAR (Drape) ×1 IMPLANT
DRESSING TELFA 3X8IN STERILE (Dressing) ×2 IMPLANT
DRESSING TRANSPARENT L4 3/4 IN X W4 IN (Dressing) ×3
DRESSING TRANSPARENT L4 3/4 IN X W4 IN POLYURETHANE ADHESIVE (Dressing) ×3 IMPLANT
DRESSING TRNS PU STD TGDRM 4.75X4IN LF (Dressing) ×3
ELECTRODE ELECTROSRGCL BLADE STD L2.5IN MEGADYNE E-Z CLEAN PTFE MNPL (Blade) ×1 IMPLANT
ELECTRODE ELECTROSURGICAL BLADE STANDARD (Blade) ×1
ELECTRODE ESURG PTFE BLDE STD MEGADYNE (Blade) ×1
GLOVE SURG BIOGEL INDIC SZ 8.5 (Glove) ×2 IMPLANT
GLOVE SURG BIOGEL ORTHO SZ8.5 (Glove) ×8 IMPLANT
GOWN X-LRG POLY REINFORCED (Gown) ×2 IMPLANT
HOLDER CATH VLCR UNV CATH-MATE LF ADJ (Procedure Accessories) ×1
HOLDER CATHETER UNIVERSAL ADJUSTABLE (Procedure Accessories) ×1 IMPLANT
HOOD SRG FLYTE STRL PLWY LEN LVL 1 DISP (Procedure Accessories)
HOOD SURGEON ZIPPER PEEL AWAY LENS FLYTE (Procedure Accessories) IMPLANT
HOOD T4 STERI-SHIELD (Personal Protection) ×8 IMPLANT
INSERT TIB X3 3 TRTHLN 9MM LF STRL CNDRL (Insert) ×1 IMPLANT
INSERT TIBIAL 3 TRIATHLON H9 MM KNEE (Insert) ×1 IMPLANT
INSERT TIBIAL 3 TRIATHLON H9 MM KNEE CONDYLAR STABILIZE BEARING X3 (Insert) ×1 IMPLANT
MANIFOLD NEPTUNE II 4 PORT (Procedure Accessories) ×2 IMPLANT
MAT OR L46 IN X W40 IN MEDIUM ABSORBENT (Procedure Accessories) ×1
MAT OR L46 IN X W40IN MD ABSRBNT FLUID BARRIER BACK SURGISAFE BLUE (Procedure Accessories) ×1 IMPLANT
MAT OR SGRSF 46X40IN LF NS MED ABS FLD (Procedure Accessories) ×1
NEEDLE YALE SHRTBEV 20GX1.5IN (Needles) ×2 IMPLANT
PAD ELECTROSRG GRND REM W CRD (Procedure Accessories) ×2 IMPLANT
PADDING CAST L4 YD X W4 IN UNDERCAST (Cast) ×1
PADDING CAST L4 YD X W4 IN UNDERCAST MILD STRETCH COHESIVE REGULAR (Cast) ×1 IMPLANT
PADDING CAST L4 YD X W6 IN UNDERCAST (Cast) ×2
PADDING CAST L4 YD X W6 IN UNDERCAST MILD STRETCH COHESIVE WEBRIL (Cast) ×2 IMPLANT
PADDING CST CTTN WBRL 4YDX4IN LF STRL (Cast) ×1
PADDING CST CTTN WBRL 4YDX6IN LF STRL (Cast) ×2
PATELLA TRIATHLON S31 (Patella) ×1 IMPLANT
SOL ALCOHOL ISOPROPYL 70% 4 OZ (Prep) ×2 IMPLANT
SOL BETADINE SOLUTION 4 OZ (Prep) ×2 IMPLANT
SOL NACL 0.9% IRR 3000CC (Irrigation Solutions) ×1
SOLUTION IRR 0.9% NACL 1000ML LF STRL (Irrigation Solutions) ×1
SOLUTION IRR 0.9% NACL 3L URMTC LF PLS (Irrigation Solutions) ×1
SOLUTION IRRIGATION 0.9% SODIUM CHLORIDE (Irrigation Solutions) ×1
SOLUTION IRRIGATION 0.9% SODIUM CHLORIDE 1000 ML PLASTIC POUR BOTTLE (Irrigation Solutions) ×1 IMPLANT
SOLUTION IRRIGATION 0.9% SODIUM CHLORIDE 3000 ML PLASTIC CONTAINER (Irrigation Solutions) ×1 IMPLANT
SPONGE GAUZE L4 IN X W4 IN 12 PLY (Sponge) ×1 IMPLANT
SPONGE GZE PLS CTTN CRTY 4X4IN LF STRL (Sponge) ×1
STAPLER SKIN L3.9 MM X W6.9 MM WIDE 35 (Skin Closure) ×1
STAPLER SKIN L3.9 MM X W6.9 MM WIDE 35 COUNT FIX HEAD RATCHET (Skin Closure) ×1 IMPLANT
STAPLER SKN SS W PRX PX .58MM 3.9X6.9MM (Skin Closure) ×1
STERILE WATER IRIGATION 1000ML (Irrigation Solutions) ×2 IMPLANT
STRAP POSITIONING L19 IN X W3.5 IN (Procedure Accessories) ×1
STRAP POSITIONING L19 IN X W3.5 IN STIRRUP SLIP RING TECLIN (Procedure Accessories) ×1 IMPLANT
STRAP PSTN TECLIN 19X3.5IN LF NS STRUP (Procedure Accessories) ×1
SURGEON SURGICAL VEST (Ortho Supply) ×2 IMPLANT
SUTURE ABS 1 CP COAT VCL 27IN BRD UD (Suture) ×2
SUTURE ABS 2-0 SH MNCRL 27IN MFL UD (Suture) ×3
SUTURE COATED VICRYL 1 CP L27 IN BRAID (Suture) ×2
SUTURE COATED VICRYL 1 CP L27 IN BRAID UNDYED ABSORBABLE (Suture) ×2 IMPLANT
SUTURE MONOCRYL 2-0 SH L27 IN (Suture) ×3
SUTURE MONOCRYL 2-0 SH L27 IN MONOFILAMENT UNDYED ABSORBABLE (Suture) ×3 IMPLANT
SYRINGE BULB 60CC LATEX FREE (Syringes, Needles) ×2 IMPLANT
SYRINGE LEUR LOK TIP 30 ML (Syringes, Needles) ×2 IMPLANT
SYRINGE MED 30ML BD LF STRL ST GRAD (Syringes, Needles) ×2 IMPLANT
TIP SCT VNYL ARG YNKR 18FR 25CM LF STRL (Tubing) ×2
TIP SUCTION ARGYLE YANKAUER OD18 FR L25 CM VINYL SMOOTH INNER LUMEN (Tubing) ×2 IMPLANT
TIP SUCTION OD18 FR L25 CM VINYL SMOOTH (Tubing) ×2
TIP SUCTN FRAZ 12F (Suction)
TOOL DISSECTING L14 CM BALL FLUTE OD4 MM (Drillbits) ×1
TOOL DISSECTING L14 CM BALL FLUTE OD4 MM MIDAS REX LEGEND BURR (Drillbits) ×1 IMPLANT
TOOL DSCT LGND 4MM 14CM (Drillbits) ×1
TOURNIQUET 30'STRL EA=1 (Ortho Supply) IMPLANT
TOURNIQUET 34X4 PURPLE (Procedure Accessories) IMPLANT
TOURNIQUET 44X4 BLUE (Procedure Accessories) IMPLANT
TOWEL BLUE STERILE (Procedure Accessories) ×6 IMPLANT
TOWEL L26 IN X W17 IN COTTON PREWASH (Procedure Accessories) ×3
TOWEL L26 IN X W17 IN COTTON PREWASH DELINT BLUE ACTISORB SURGICAL (Procedure Accessories) ×3 IMPLANT
TOWEL SRG CTTN 26X17IN LF STRL PREWASH (Procedure Accessories) ×3
TRAY CATH FOLEY SILICONE 16FR (Catheter Urine) ×2 IMPLANT
TRAY TOTAL KNEE PACK (Tray) ×2 IMPLANT
TUBING SUCTION OD12 FR VENT OBTURATOR FLAKE RESISTANT SHAFT BARON (Suction) IMPLANT
WATER STERILE PVC FREE DEHP FREE 1000 ML (Solution) ×2 IMPLANT
WATER STRL 1000ML PIC LF PVC FR DEHP-FR (Solution) ×2
WRAP CMPR POR CBN 5YDX6IN LF STRL SLFADH (Bandage)
WRAP COMPRESSION L5 YD X W6 IN SELF (Bandage)
WRAP COMPRESSION L5 YD X W6 IN SELF ADHERENT ELASTIC LIGHTWEIGHT HAND (Bandage) IMPLANT

## 2013-08-25 NOTE — Anesthesia Procedure Notes (Signed)
Spinal      Patient location during procedure: pre-op  Reason for block: Primary Anesthesia In the OR  Block at Surgeon's request: Yes    Start time: 08/25/2013 7:55 AM    End time: 08/25/2013 8:12 AM    Preanesthetic Checklist   Timeout Completed:  08/25/2013 7:56 AM        Additional Notes  Spinal      Staffing  Anesthesiologist: Marny Lowenstein N  Performed by: anesthesiologist   Preanesthetic Checklist Completed: patient identified, surgical consent, pre-op evaluation, timeout performed, risks and benefits discussed, monitors and equipment checked, anesthesia consent given and correct site      Spinal  Patient monitoring: pulse oximetry, EKG, NIBP and nasal cannula O2  Premedication: Yes and Meaningful Contact Maintained    Patient position: sitting  Sterile Technique: Betadine, mask  and sterile gloves  Skin Local: lidocaine 1%    Interspace: L2-3    Approach: midline  Number of attempts: 1 for epidural with 17g Touhy, bone encountered. 2 for spinal.      Needle type: Quincke     Needle gauge: 22              Paresthesia Pain: no.  Transient with first SAB attempt to left thigh.        CSF Return: Yes  Blood Return: No          Assessment   Sensory level: Bilateral  Block Outcome: patient tolerated procedure well, no complications and successful block

## 2013-08-25 NOTE — Progress Notes (Signed)
[facility] PROGRESS NOTE    Date Time: 08/25/2013 1:12 PM  Patient Name: [name], [name]        Subjective:   "Sometimes I get winded because of my COPD."    Medications:      Scheduled Meds: PRN Meds:         [COMPLETED] acetaminophen 1,000 mg Oral Once   [START ON 08/26/2013] amLODIPine 5 mg Oral Daily   aspirin EC 325 mg Oral Daily   ceFAZolin 1 g Intravenous Q8H   [COMPLETED] celecoxib 400 mg Oral Once   cloNIDine 0.1 mg Oral Daily   docusate sodium 100 mg Oral BID   gabapentin 300 mg Oral BID   [COMPLETED] ketorolac 15 mg Intravenous Once   [COMPLETED] oxyCODONE 10 mg Oral Once   NON-FORMULARY PAT OWN MED order form 1 tablet Oral QHS   [COMPLETED] pregabalin 75 mg Oral Once   [START ON 08/26/2013] tiotropium 18 mcg Inhalation QAM   traMADol 50 mg Oral Q6H SCH   [COMPLETED] tranexamic acid (CYKLOPRON) IVPB 1,000 mg Intravenous Once   warfarin 2.5 mg Oral Once   [DISCONTINUED] acetaminophen 650 mg Oral Q4H   [DISCONTINUED] ceFAZolin 2,000 mg Intravenous 30 Min Pre-Op   [DISCONTINUED] ketorolac 30 mg Intravenous Once   [DISCONTINUED] pregabalin 75 mg Oral BID   [DISCONTINUED] scopolamine 1 patch Transdermal Once   [DISCONTINUED] tranexamic acid (CYKLOPRON) IVPB 1,000 mg Intravenous See Admin Instructions         Continuous Infusions:       . sodium chloride 100 mL/hr at 08/25/13 1222   . bupivacaine 5 mL/hr (08/25/13 5284)   . lactated ringers           HYDROcodone-acetaminophen 1 tablet Q4H PRN   HYDROcodone-acetaminophen 2 tablet Q6H PRN   HYDROcodone-acetaminophen 2 tablet Q4H PRN   HYDROmorphone 0.5 mg Q1H PRN   hydrOXYzine 25 mg Q12H PRN   ondansetron 4 mg Q8H PRN   senna-docusate 2 tablet BID PRN   zolpidem 5 mg QHS PRN   [DISCONTINUED] bacitracin 50,000 units/normal saline irrigation 100,000 Units PRN   [DISCONTINUED] betaxolol  PRN   [DISCONTINUED] diphenhydrAMINE 6.5 mg Q6H PRN   [DISCONTINUED] fentaNYL 50 mcg Q5 Min PRN   [DISCONTINUED] HYDROcodone-acetaminophen 1 tablet Once PRN    [DISCONTINUED] HYDROmorphone 0.5 mg Q10 Min PRN   [DISCONTINUED] meperidine 25 mg Q2H PRN   [DISCONTINUED] promethazine 6.25 mg Once PRN   [DISCONTINUED] promethazine 6.25 mg Once PRN         I personally reviewed all of the medications      Review of Systems:   A comprehensive review of systems was obtained from chart review and the patient  General ROS: negative  Ophthalmic ROS: negative  Endocrine ROS: negative  Respiratory ROS: no cough, shortness of breath, or wheezing  Cardiovascular ROS: no chest pain or dyspnea on exertion  Gastrointestinal ROS: no abdominal pain, change in bowel habits  Genito-Urinary ROS: no c/o-has foley catheter  Musculoskeletal ROS: denies pain currently  Neurological ROS: no TIA or stroke symptoms, denies dizziness, LE with numbness, s/p epidural anesthesia      Physical Exam:     Filed Vitals:    08/25/13 1209 08/25/13 1227 08/25/13 1232 08/25/13 1259   BP: 148/88 167/76 187/79 193/81   Pulse: 57 62 65 60   Temp: 95.7 F (35.4 C)  95.8 F (35.4 C)    TempSrc:       Resp: 18 16 18 16    Height:  Weight:       SpO2: 98% 100% 100%          Intake and Output Summary (Last 24 hours) at Date Time    Intake/Output Summary (Last 24 hours) at 08/25/13 1312  Last data filed at 08/25/13 1150   Gross per 24 hour   Intake   1700 ml   Output    300 ml   Net   1400 ml       General appearance - alert, well appearing, and in no distress  Mental status - alert, oriented to person, place, and time  Chest - clear to auscultation, no wheezes, rales or rhonchi, symmetric air entry  Heart - normal rate, regular rhythm, normal S1, S2, no murmurs  Abdomen - bowel sounds normal, abdomen is soft, nontender, non distended  Neurological - alert, oriented, normal speech, no sensation Bilat LE- s/p epidural anesthesia  Musculoskeletal - laying on bed  Extremities - peripheral pulses normal, no pedal edema, no clubbing or cyanosis    Consultant note reviewed.    Labs:       Lab 08/21/13 1017   GLU 94   BUN  15   CREAT 0.7   CA 9.6   NA 145   K 3.6   CL 105   CO2 27   ALB 3.6   PHOS --   MG --   EGFR >60.0   PT 13.4   PTT 29   INR 1.0         Lab 08/21/13 1017   AST 19   ALT 17   ALKPHOS 72   ALB 3.6   BILITOTAL 0.5   AMY --   LIP --         Lab 08/21/13 1017   WBC 8.09   HGB 11.3*   HCT 36.8*   MCV 86.4   MCH 26.5*   MCHC 30.7*   RDW 15   MPV 11.2       No results found for this basename: TSH,FREET3,FREET4 in the last 168 hours        Rads:     Radiology Results (24 Hour)     Procedure Component Value Units Date/Time    X-ray knee right AP and lateral [[id number]] Collected:08/25/13 1135    Order Status:Completed  Updated:08/25/13 1139    Narrative:    CLINICAL INDICATION: Right total knee arthroplasty,, hardware  evaluation.    FINDINGS: AP and lateral views of the right knee were obtained. The  right knee prothesis is in good position. There is no fracture or  dislocation. Postsurgical changes are seen in the soft tissues.       Impression:      Good position of the right knee prothesis. No fracture or  dislocation.    Wyatt Portela, MD   08/25/2013 11:35 AM                  Assessment and Plan:      Patient Active Problem List   Diagnosis   . Total knee replacement status- R TKA   . Low back pain   . Chronic obstructive pulmonary disease- continue meds   Incentive spirometry taught/reviewed with patient. Pt demonstrates adequate technique.  Morbid Obesity  HTN- follow BP, continue meds. Resume HCTZ as appropriate. Follow electrolytes.    Signed by: Sheryle Hail, NP

## 2013-08-25 NOTE — Transfer of Care (Signed)
Anesthesia Transfer of Care Note    Patient: [name]    Procedures performed: Procedure(s) with comments:  ARTHROPLASTY, KNEE, TOTAL    Anesthesia type: Spinal    Patient location:Phase I PACU    Last vitals:   Filed Vitals:    08/25/13 0815   BP:    Pulse: 58   Temp:    Resp:    SpO2: 100%       Post pain: Patient not complaining of pain, continue current therapy      Mental Status:awake    Respiratory Function: tolerating nasal cannula    Cardiovascular: stable    Nausea/Vomiting: patient not complaining of nausea or vomiting    Hydration Status: adequate    Post assessment: no apparent anesthetic complications

## 2013-08-25 NOTE — Op Note (Signed)
FULL OPERATIVE NOTE    Date Time: 08/25/2013 5:05 PM  Patient Name: [name]  Attending Physician: Lane Hacker, MD      Date of Operation:   08/25/2013    Providers Performing:   Lane Hacker, M.D.    Assistant (s): Stephannie Li, PA-C    Preoperative Diagnosis:   Severe right knee degenerative joint disease    Postoperative Diagnosis:   Severe right knee degenerative joint disease    Operative Procedure:   #1) Right Total Knee Arthroplasty  #2) Insertion of drug-delivery-device.    Anesthesia:   Spinal, epidural    Estimated Blood Loss:   50    Fluids:     Specimens:   None.    Complications:   None, stable to PACU.    Implants Utilized:   Corporate investment banker, size #3 CR Right, Paediatric nurse, size #3, Stryker Triathlon X3 Tibial Bearing Insert - CS, size #3x71mm, Stryker Triathlon X3 Symmetric Patella size S31x67mm.      Indications:   This is a 77 y.o.-year old patient with a long history of severe right knee pain secondary to degenerative joint disease.  After review of the appropriate imaging studies, a detailed physical examination and history, it was recommended that the patient undergo right total knee arthroplasty as the patient had failed conservative management.    Operative Notes:   Prior to initiation of the operative procedure a surgical time-out was taken and the patient's name, medical record, number, date-of-birth, and operative side was verified with the surgical consent form.  Additionally it was verified that the appropriate peri-operative antibiotic administration was completed, as well as that radiographs were available and the correct operative instrumentation and implants were available.    After routine preparation and draping of the patient in the total joint operating room, the esmark bandage was utilized to exsanguinate the extremity and the tourniquet inflated.  A midline skin incision was made over the knee joint.   The medial border of the patella, tibial tubercle, and VMO were clearly identified.  A medial parapatellar approach was made to the right knee joint. The medial and lateral fat pad was excised completely using a blade.  The ACL and PCL were resected.  The knee was placed in full extension, homan retractors placed medial and lateral over the distal femur and an anterior synovectomy completed.  Subsequently, the knee was flexed to 90 degrees, a rake retractor placed over the anterior horn of  the medial meniscus and a medial release performed using electrocautery.  A cobb elevator was used to complete the medial release at the level of the joint line to the posterior aspect of the proximal tibia.      A homan retractor was then placed in the posterior aspect of the knee and the proximal tibial surface exposed.  Lahey retractors were utilized to grasp the medial and lateral meniscus and these were excised.  The entire surface of the proximal tibia was exposed.  The extramedullary tibial cutting guide was then placed using the tibial crest, ankle joint and external landmarks the proximal tibia was cut.  The cut was then checked with an external alignment guide and found to be appropriate.  The correct sizing plate was then applied and pinned into place.  The proximal tibia was shaped to accept a #3x4mm tibial component. The drill guide was placed and the drill inserted.  Subsequently a high speed burr was used to open  up the canal through the sizing plate.  The tibial tamp was then placed and impacted into the proximal tibia.  At this point the retractors were removed.       Subsequently the femur was exposed with blunt homan retractors.  A drill was inserted into the femoral canal and used to create an entry point for the alignment guide.  The intramedullary alignment guide set at 6-degree valgus orientation was then inserted into the femur.  The anterior skim cut guide was placed and the anterior femoral cortex cut.   A booth retractor was placed and blunt homan retractors placed to protect the medial and lateral collateral ligaments.  Subsequently the distal cutting guide was placed and the distal femur cut.  A 4-in-1 guide was placed on the femur and the femur was shaped to accept a size #3 CR Right femoral component.  A laminar spreader was placed and a curved osteotome used to remove posterior osteophytes.  A curette was used to remove loose bony fragments and electrocautery used to remove remaining meniscal fragments.     Subsequently, the patella was cut to accept a size 31x59mm patellar component. All cuts were checked using cutting and alignment guides.  The knee was felt to be well aligned and stable throughout the range of motion after insertion of the trial and final knee components. The bony surfaces were cleaned for cementation with pulsatile lavage. All components were inserted using pressurized vacuum-mixed cement. The wound was closed in layers using vicryl suture, monocryl suture, and skin staples. A soft compression dressing was applied.      Stryker Triathlon Knee Components were utilized for this procedure.    At the conclusion of this procedure, utilizing a separate surgical site, a cannulated needle and plastic sheath were inserted into the left knee. The needle was removed and a catheter (On-Q) was inserted through the plastic sheath and into the joint. The catheter was then appropriately positioned within the joint. The plastic sheath was removed. The catheter was then attached to a reservoir (On-Q pain pump) which contained a local anesthetic (Marcaine 0.5%). During closure of the joint, additional effort and care were utilized to avoid suturing the catheter inadvertently in the wound.      The first-assistant was critical to all steps of the operation, including retraction and leg stabilization during exposure and bone preparation, as well as the deep and superficial wound closure.  Dr. Ahmed Prima performed  and/or supervised the key portions of this surgical procedure, including evaluation of the bone cuts, reshaping of the bony elements, and insertion of the provisional and final components.      Signed by: Garey Ham, MD

## 2013-08-25 NOTE — Interval H&P Note (Signed)
The history and physical exam completed by internal medicine consultant for this patient has been reviewed. There are no apparent interval changes to the patient's status.. The patient appears ready to proceed with surgery. The patient has been given the opportunity to ask any additional questions regarding the proposed procedure.  The patient appears ready to proceed as planned. All questions were answered.The history and physical exam completed by internal medicine consultant for this patient has been reviewed. There are no apparent interval changes to the patient's status.. The patient appears ready to proceed with surgery. The patient has been given the opportunity to ask any additional questions regarding the proposed procedure.  The patient appears ready to proceed as planned. All questions were answered.

## 2013-08-25 NOTE — PT Eval Note (Signed)
[facility]  [address]  [place] [address]  [phone]    Physical Therapy Evaluation    Patient: [name] MRN: [id number]   Unit: [facility] 4C Bed: F810/F751.02    Time of Treatment: Time Calculation  PT Received On: 08/25/13  Start Time: 1315  Stop Time: 1400  Time Calculation (min): 45 min    Consult received for [name] for PT evaluation and treatment.  Patient's medical condition is appropriate for Physical Therapy  intervention at this time.        Precautions  Weight Bearing Status: RLE WBAT  Exercises:     History of Present Illness: [name] is a 77 y.o. female admitted on 08/25/2013   Medical Diagnosis: Primary localized osteoarthrosis, lower leg, right [715.16] (Primary localized osteoarthrosis, lower leg, right [715.16])  Total knee replacement status  Surgical Diagnosis: Right TKR Procedure(s):  ARTHROPLASTY, KNEE, TOTAL performed by  Lane Hacker, MD on 08/25/2013.      Patient Active Problem List   Diagnosis   . Total knee replacement status   . Low back pain   . Chronic obstructive pulmonary disease     Past Medical History   Diagnosis Date   . Hypertension    . Pneumonia    . Chronic obstructive pulmonary disease    . Headache(784.0)      not since surgery in 1957   . Arthritis    . Difficulty in walking(719.7)    . Low back pain      Past Surgical History   Procedure Date   . Cervical spine surgery    . Lumbar spine surgery posterior w/ hardware   . Tonsillectomy    . Brain surgery R side/ 1957 Optic nerve pressure   . Eye surgery      cataracts   . Colonoscopy    . Tubal ligation 1961         Social History:  Prior Level of Function  Prior level of function: Ambulates with assistive device  Assistive Device: Four wheel walker;Single point cane  Baseline Activity Level: Household ambulation  DME Currently at Home: Single point cane;Four wheel walker    Home Living Arrangements  Living Arrangements: Children  Type of Home:  House  Home Layout: Two level (chair on stairs)  Bathroom Shower/Tub: Medical sales representative: Standard  Bathroom Accessibility: Accessible  DME Currently at Home: Single point cane;Four wheel walker  Home Living - Notes / Comments:  (Home with children)    Subjective: Patient is agreeable to participation in the therapy session.  Patient Goal  Patient Goal:  (To go home)      Pain Assessment  Pain Assessment: No/denies pain          Objective:  Patient is in bed with IV, foley, AVIs and 2Liter O2 NC, seen Day of Surgery.    Observation of patient:     Cognition  Arousal/Alertness: Appropriate responses to stimuli  Attention Span: Appears intact  Memory: Appears intact  Following Commands: Follows one step commands without difficulty  Safety Awareness: independent  Insights: Fully aware of deficits  Problem Solving: Able to problem solve independently       Vitals: 182/83 in supine; 168/73 in sitting. No c/o dizziness-just numbness B LEs.      Musculoskeletal Examination  Gross ROM  Right Upper Extremity ROM: within functional limits  Left Upper Extremity ROM: within functional limits  Right Lower  Extremity ROM:  (10 to 75 degrees)  Left Lower Extremity ROM: within functional limits                                  Sensation is diminished    Gross Strength  Right Upper Extremity Strength: within functional limits  Left Upper Extremity Strength: within functional limits  Right Lower Extremity Strength:  (numbness; grossly 2-/5)  Left Lower Extremity Strength:  (numbness; grossly 2/5)                  Functional Mobility  Supine to Sit: Mod assist to right  Scooting: Mod assist to right  Sit to Supine: Mod assist to left  Sit to Stand: Unable to assess (Comment) (Pt c/o numbness B LEs)       Balance  Sitting - Static: Good  Sitting - Dynamic: Fair  Standing - Static: Not tested (numbness B LEs)           Therapeutic Exercises:  Straight Leg Raise: 10 AA  Quad Sets: 25  Heelslides: 15 AA  Glute Sets: 25  Hip  Abduction: 25  Knee AROM : 5 to 75  Knee AROM Short Arc Quad: 15 AA  Ankle Pumps: 25      Participation and Endurance  Participation Effort: good  Endurance: Tolerates 10 - 20 min exercise with multiple rests        Educated the patient to role of physical therapy, plan of care, goals  of therapy and safety with mobility and ADLs.    Patient is in bed  with all needs provided and call bell within reach. RN notified of session outcome.        Assessment:  [name] is a 77 y.o. female admitted 08/25/2013. Pt presents with  Right TKR  Procedure(s):  ARTHROPLASTY, KNEE, TOTAL. Patient present with deficits in strength, function, ambulation and ADLS.  Pt would benefit from skilled Physical Therapy Services.  Assessment  Assessment: Decreased LE ROM;Decreased LE strength;Decreased endurance/activity tolerance;Decreased functional mobility;Decreased balance;Gait impairment  Prognosis: Good;With continued PT status post acute discharge     Goals  Goal Formulation: With patient/family  Time for Goal Acheivement: 7 visits  Goals: Select goal  Pt Will Go Supine To Sit: Independently  Pt Will Perform Sit to Stand: Independently  Pt Will Transfer Bed/Chair: Independently  Pt Will Transfer to Toilet: With stand by assist  Pt Will Transfer to Tub/Shower: With stand by assist  PT Will Demonstrate Car Transfer Technique: With minimal assist  Pt Will Ambulate: 31-50 feet;with rolling walker;With stand by assist  Pt Will Perform Home Exer Program: With minimal assist  Pt Will Perform LE Dressing w/Device: With min assist    Rehabilitation Potential  Prognosis: Good;With continued PT status post acute discharge      Plan:   Plan  Risks/Benefits/POC Discussed with Pt/Family: With patient/family  Patient Goal:  (To go home)  Treatment/Interventions: Exercise;Gait training;LE strengthening/ROM;Endurance training;Bed mobility;Functional transfer training  Progress: Progressing toward goals  PT Frequency:  7x/wk    Recommendation  Discharge Recommendation: Home with home health PT;Home with outpatient PT  DME Recommended for Discharge:  (None-has equipment)  PT - Next Visit Recommendation: 08/26/13  PT Frequency: 7x/wk    Signature: Lorenz Coaster, PT

## 2013-08-25 NOTE — H&P (View-Only) (Signed)
CONSULTATION    Date Time:08/21/2013 11:47 AM  Patient Name: [name]  Requesting Physician: Lane Hacker      Reason for Consultation:   Medical clearance for surgery.      History:   [name] is a 77 y.o. female who presents for preop evaluation. The patient is scheduled for an elective Right TKA for worsening joint pain. She feels well overall without any complaints. METS score>4.    Chief Complaint:   No chief complaint on file.      Problem List:   There is no problem list on file for this patient.      Past Medical History:     Past Medical History   Diagnosis Date   . Hypertension    . Pneumonia    . Chronic obstructive pulmonary disease    . Headache(784.0)      not since surgery in 1957   . Arthritis    . Difficulty in walking(719.7)    . Low back pain        Past Surgical History:     Past Surgical History   Procedure Date   . Cervical spine surgery    . Lumbar spine surgery posterior w/ hardware   . Tonsillectomy    . Brain surgery R side/ 1957 Optic nerve pressure   . Eye surgery      cataracts   . Colonoscopy    . Tubal ligation 1961       Family History:   No family history on file.    Social History:     History     Social History   . Marital Status: Widowed     Spouse Name: N/A     Number of Children: N/A   . Years of Education: N/A     Occupational History   . Not on file.     Social History Main Topics   . Smoking status: Former Smoker -- 1.0 packs/day for 45 years     Quit date: 08/20/1998   . Smokeless tobacco: Never Used   . Alcohol Use: Yes      Comment: occassionally   . Drug Use: No   . Sexually Active:      Other Topics Concern   . Not on file     Social History Narrative   . No narrative on file       Allergies:   No Known Allergies    Medications:     Prior to Admission medications    Medication Sig Start Date End Date Taking? Authorizing Provider   amLODIPine (NORVASC) 10 MG tablet Take 5 mg by mouth daily.    [provider]   aspirin 81 MG tablet Take 81 mg by mouth  daily.    [provider]   cloNIDine (CATAPRES) 0.1 MG tablet Take 0.1 mg by mouth daily.    [provider]   gabapentin (NEURONTIN) 300 MG capsule Take 300 mg by mouth 2 (two) times daily.     [provider]   hydrochlorothiazide (HYDRODIURIL) 25 MG tablet Take 25 mg by mouth daily.    [provider]   ibuprofen (ADVIL,MOTRIN) 800 MG tablet Take 800 mg by mouth 2 (two) times daily.    [provider]   loratadine (CLARITIN) 10 MG tablet Take 10 mg by mouth as needed.    [provider]   Multiple Vitamin (MULTIVITAMIN) tablet Take 1 tablet by mouth daily.  [provider]   nebivolol (BYSTOLIC) 5 MG tablet Take 5 mg by mouth daily.    [provider]   Omega-3 Fatty Acids (OMEGA 3 PO) Take by mouth 2 (two) times daily.    [provider]   potassium chloride 8 MEQ Cap CR Take 8 mEq by mouth once.    [provider]   tiotropium (SPIRIVA) 18 MCG inhalation capsule Place 18 mcg into inhaler and inhale daily.    [provider]       Review of Systems:   A comprehensive review of systems was obtained from chart review and the patient.    General ROS: negative for - malaise and fatigue  Psychological ROS: negative for - disorientation or suicidal ideation  Ophthalmic ROS: negative for - blurry vision  ENT ROS: negative for - headaches  Allergy and Immunology ROS: negative  Hematological and Lymphatic ROS: negative for - bleeding problems  Endocrine ROS: negative for - malaise/lethargy  Respiratory ROS: no cough, shortness of breath, or wheezing  Cardiovascular ROS: no chest pain or dyspnea on exertion  Gastrointestinal ROS: no abdominal pain, change in bowel habits, or black or bloody stools  Genito-Urinary ROS: no dysuria, trouble voiding, or hematuria  Musculoskeletal ROS: positive for - joint pain  Neurological ROS: no TIA or stroke symptoms      Physical Exam:     Filed Vitals:    08/21/13 1047   BP: 190/85    Pulse: 63       General appearance - alert, well appearing, and in no distress  Mental status - alert, oriented to person, place, and time, affect appropriate to mood  Eyes - pupils equal and reactive, sclera anicteric. No pallor.  Ears - right ear normal, left ear normal  Nose - normal, no discharge  Mouth - mucous membranes moist,   Neck - supple, no significant adenopathy  Chest - clear to auscultation, no wheezes, rales or rhonchi, symmetric air entry  Heart - normal rate, regular rhythm, normal S1, S2, no murmurs, rubs, or gallops  Abdomen - soft, nontender, nondistended, no organomegaly  Neurological - alert, oriented, normal speech, no focal findings  Musculoskeletal - Right Knee OA changes with tenderness and painful ROM  Extremities - peripheral pulses normal, no pedal edema    Labs:     Results     Procedure Component Value Units Date/Time    CBC [[id number]]  (Abnormal) Collected:08/21/13 1017    Specimen Information:Blood / Blood Updated:08/21/13 1104     WBC 8.09      RBC 4.26      Hgb 11.3 (L) g/dL      Hematocrit 86.5 (L) %      MCV 86.4 fL      MCH 26.5 (L) pg      MCHC 30.7 (L) g/dL      RDW 15 %      Platelets 271      MPV 11.2 fL      Nucleated RBC 0      Absolute NRBC 0.00     Culture Staph aureus and MRSA [place] [[id number]] Collected:08/21/13 1000    Specimen Information:Throat Updated:08/21/13 1101    Culture Staph aureus and MRSA [place]an [[id number]] Collected:08/21/13 1000    Specimen Information:Nares Updated:08/21/13 1101    Comprehensive metabolic panel [[id number]] Collected:08/21/13 1017    Specimen Information:Blood Updated:08/21/13 1046     Glucose 94 mg/dL      BUN 15 mg/dL  Creatinine 0.7 mg/dL      Sodium 951 mEq/L      Potassium 3.6 mEq/L      Chloride 105 mEq/L      CO2 27 mEq/L      CALCIUM 9.6 mg/dL      Protein, Total 7.0 g/dL      Albumin 3.6 g/dL      AST (SGOT) 19 U/L      ALT 17 U/L      Alkaline Phosphatase 72 U/L      Bilirubin, Total 0.5 mg/dL      Globulin 3.4  g/dL      Albumin/Globulin Ratio 1.1     GFR [[id number]] Collected:08/21/13 1017     EGFR >60.0 Updated:08/21/13 1046    UA, Reflex to Microscopic [[id number]]  (Abnormal) Collected:08/21/13 1017     Urine Type Random Updated:08/21/13 1035     Color, UA Yellow      Clarity, UA Sl Cloudy (A)      Specific Gravity UA 1.016      Urine pH 7.0      Leukocyte Esterase, UA Trace (A)      Nitrite, UA Negative      Protein, UR 30 (A)      Glucose, UA Negative      Ketones UA Negative      Urobilinogen, UA 4.0 (A) mg/dL      Bilirubin, UA Negative      Blood, UA Negative      RBC, UA 0 - 5      WBC, UA 0 - 5      Squamous Epithelial Cells, Urine 0 - 5     APTT [[id number]] Collected:08/21/13 1017     PTT 29 Updated:08/21/13 1035    Protime-INR [[id number]] Collected:08/21/13 1017    Specimen Information:Blood Updated:08/21/13 1035     PT 13.4      PT INR 1.0      PT Anticoag. Given Within 48 hrs. Other:  Specify     Type and Screen [[id number]] Collected:08/21/13 1017    Specimen Information:Blood Updated:08/21/13 1017          Rads:     Radiology Results (24 Hour)     ** No Results found for the last 24 hours. **          EKG: Normal.    Assessment/Plan:   Pre-operative Evaluation.  Osteoarthritis of  Right Knee.  History of COPD. Continue outpatient medications.  Hypertension - Hold diuretic on day of surgery and post-operative, hold blood pressure medicine for SBP<110.  Mild Anemia - give iron after surgery.   No ASA/NSAID's, OTC Meds 10 days prior to surgery as per preop instructions.DVT and GI prophylaxis per orthopedics. Med Rec completed for post-op today and discussed with patient. Pt is medically cleared. Will follow patient post-operatively. Thank you for consultation.    Additional Recommendations:   CBC/BMP POD #1      Signed HY:WVPX Hilton Sinclair, MD

## 2013-08-25 NOTE — Plan of Care (Signed)
Problem: Day of Surgery- Knee Surgery  Goal: Mobility/activity is maintained at optimum level for patient  Outcome: Progressing  PT Plan  Treatment/Interventions: Exercise;Gait training;LE strengthening/ROM;Endurance training;Bed mobility;Functional transfer training  Recommendation  Discharge Recommendation: Home with home health PT;Home with outpatient PT  DME Recommended for Discharge:  (None-has equipment)  PT - Next Visit Recommendation: 08/26/13  PT Frequency: 7x/wk    Patient is agreeable to the following Goals:   Goals  Goal Formulation: With patient/family  Time for Goal Acheivement: 7 visits  Goals: Select goal  Pt Will Go Supine To Sit: Independently  Pt Will Perform Sit to Stand: Independently  Pt Will Transfer Bed/Chair: Independently  Pt Will Transfer to Toilet: With stand by assist  Pt Will Transfer to Tub/Shower: With stand by assist  PT Will Demonstrate Car Transfer Technique: With minimal assist  Pt Will Ambulate: 31-50 feet;with rolling walker;With stand by assist  Pt Will Perform Home Exer Program: With minimal assist  Pt Will Perform LE Dressing w/Device: With min assist

## 2013-08-26 ENCOUNTER — Encounter: Payer: Self-pay | Admitting: Orthopaedic Surgery

## 2013-08-26 LAB — PT/INR
PT INR: 1 (ref 0.9–1.1)
PT: 13.4 (ref 12.6–15.0)

## 2013-08-26 LAB — BASIC METABOLIC PANEL
BUN: 15 mg/dL (ref 7–21)
CO2: 26 mEq/L (ref 22–29)
Calcium: 8.6 mg/dL (ref 7.9–10.6)
Chloride: 107 mEq/L (ref 98–107)
Creatinine: 0.7 mg/dL (ref 0.6–1.0)
Glucose: 113 mg/dL — ABNORMAL HIGH (ref 70–100)
Potassium: 3.7 mEq/L (ref 3.5–5.1)
Sodium: 142 mEq/L (ref 136–145)

## 2013-08-26 LAB — GFR: EGFR: >60

## 2013-08-26 LAB — HEMOGLOBIN AND HEMATOCRIT, BLOOD
Hematocrit: 33.8 % — ABNORMAL LOW (ref 37.0–47.0)
Hgb: 10.4 g/dL — ABNORMAL LOW (ref 12.0–16.0)

## 2013-08-26 MED ORDER — WARFARIN SODIUM 2.5 MG PO TABS
2.5000 mg | ORAL_TABLET | Freq: Every day | ORAL | Status: DC
Start: 2013-08-26 — End: 2013-08-26

## 2013-08-26 MED ORDER — HYDROCODONE-ACETAMINOPHEN 5-325 MG PO TABS
1.0000 | ORAL_TABLET | ORAL | Status: DC | PRN
Start: 2013-08-26 — End: 2013-10-14

## 2013-08-26 MED ORDER — TRAMADOL HCL 50 MG PO TABS
50.0000 mg | ORAL_TABLET | Freq: Four times a day (QID) | ORAL | Status: DC | PRN
Start: 2013-08-26 — End: 2013-10-14

## 2013-08-26 MED ORDER — DOCUSATE SODIUM 100 MG PO CAPS
100.0000 mg | ORAL_CAPSULE | Freq: Two times a day (BID) | ORAL | Status: DC
Start: 2013-08-26 — End: 2013-10-14

## 2013-08-26 MED ORDER — CELECOXIB 200 MG PO CAPS
200.0000 mg | ORAL_CAPSULE | Freq: Every day | ORAL | Status: DC
Start: 2013-08-26 — End: 2013-10-14

## 2013-08-26 MED ORDER — AMLODIPINE BESYLATE 5 MG PO TABS
5.0000 mg | ORAL_TABLET | Freq: Every day | ORAL | Status: DC
Start: 2013-08-26 — End: 2013-08-26
  Administered 2013-08-26: 5 mg via ORAL
  Filled 2013-08-26: qty 1

## 2013-08-26 MED ORDER — ASPIRIN 325 MG PO TBEC
325.0000 mg | DELAYED_RELEASE_TABLET | Freq: Every day | ORAL | Status: DC
Start: 2013-08-26 — End: 2014-07-02

## 2013-08-26 MED ORDER — ASPIRIN 325 MG PO TBEC
325.0000 mg | DELAYED_RELEASE_TABLET | Freq: Every day | ORAL | Status: DC
Start: 2013-08-26 — End: 2013-08-26
  Administered 2013-08-26: 325 mg via ORAL
  Filled 2013-08-26: qty 1

## 2013-08-26 MED ORDER — ONDANSETRON 4 MG PO TBDP
4.0000 mg | ORAL_TABLET | Freq: Three times a day (TID) | ORAL | Status: DC | PRN
Start: 2013-08-26 — End: 2014-06-01

## 2013-08-26 MED ORDER — HYDRALAZINE HCL 25 MG PO TABS
25.0000 mg | ORAL_TABLET | Freq: Once | ORAL | Status: DC
Start: 2013-08-26 — End: 2013-08-26
  Filled 2013-08-26: qty 1

## 2013-08-26 NOTE — Addendum Note (Signed)
Addendum  created 08/26/13 1406 by Theodoro Kos, CRNA    Modules edited:Notes Section

## 2013-08-26 NOTE — Plan of Care (Signed)
Problem: Day of Surgery- Knee Surgery  Goal: Hemodynamic Stability  Outcome: Progressing  Pt with hx of htn,,bp was 194/84 in evening,received scheduled clonidine and ivf were decreased to 50cc/hr.bp 171/81   Goal: Pain at adequate level as identified by patient  Outcome: Progressing  Pt states pain 5/10 on rle,qpump infusing,pt medicated with scheduled tramadol  ,norco given prn with good effect   Goal: Mobility/activity is maintained at optimum level for patient  Outcome: Progressing  Pt did dangle with PT,not oob due to numbness ,encouraged bed exercises

## 2013-08-26 NOTE — Discharge Summary (Signed)
Physician Discharge Summary   Patient ID:  [name]  [id number]  77 y.o.  02/24/1936    Admit date: 08/25/2013    Discharge date: 08/26/13    Admitting Physician: Lane Hacker, MD     Admission Diagnoses: right knee osteoarthritis    Discharge Diagnoses: right knee osteoarthritis    Operative Procedures: right total knee arthroplasty    Indication for Admission: The patient has a history of  knee osteoarthritis that has become progressively worse, and the patient has elected to proceed with total knee arthroplasty.    Hospital Course: The patient was admitted to the hospital on 08/25/2013 and underwent total knee arthroplasty. The procedure was performed without complication. Please see the operative report for details thereof. Post-operatively, the patient was transferred to the orthopaedic floor and received standard prophylactic perioperative antibiotics.  DVT prophylaxis including TED hose, foot pumps, early ambulation, and Coumadin was started on POD #0.  The patient was neurovascularly intact on the operative leg, and with dressing change, the incision was found to be clean, dry, and intact.    The patient progressed well throughout the hospitalization and was deemed stable for discharge on POD #1 .      Disposition: stable    Discharge Medications:      Medication List       As of 08/26/2013  7:02 AM      ASK your doctor about these medications           amLODIPine 10 MG tablet    Commonly known as: NORVASC        aspirin 81 MG tablet        cloNIDine 0.1 MG tablet    Commonly known as: CATAPRES        gabapentin 300 MG capsule    Commonly known as: NEURONTIN        hydrochlorothiazide 25 MG tablet    Commonly known as: HYDRODIURIL        ibuprofen 800 MG tablet    Commonly known as: ADVIL,MOTRIN        loratadine 10 MG tablet    Commonly known as: CLARITIN        multivitamin tablet        nebivolol 5 MG tablet    Commonly known as: BYSTOLIC        OMEGA 3 PO        potassium chloride 8 MEQ Cpcr         tiotropium 18 MCG inhalation capsule    Commonly known as: SPIRIVA             Patient Instructions:     The patient will notify the office for any increased bleeding, redness, drainage, swelling, worsening pain, calf pain or swelling, or other concerning symptoms.  They have been instructed to report to the emergency room immediately for any chest pain or shortness of breath.     Physical therapy has been ordered.    Activity:The patient is weight bearing as tolerated on the operative extremity.  Wound Care: The patient has a dressing that should be changed.  They can get the incision wet in the shower, and will pat dry afterwards and cover with new dressing  .  Follow-up with Lane Hacker, MD at scheduled appointment 4 weeks post-op.    Signed:  Garey Ham  08/26/2013  7:02 AM

## 2013-08-26 NOTE — PT Progress Note (Signed)
Physical Therapy Note    Physical Therapy Treatment    Patient:  [name]        MRN#:  [id number]  Unit:   [facility] 4C        Room/Bed:  M442/M442.01    Time of treatment: Start Time: 0915 Stop Time: 1015  Time Calculation (min): 60 min  PT Received On: 08/26/13    Treatment #: PT Visit Number: 1/7    Precautions  Weight Bearing Status: RLE WBAT    Medical Diagnosis:   Primary localized osteoarthrosis, lower leg, right [715.16] (Primary localized osteoarthrosis, lower leg, right [715.16])  Total knee replacement status  Surgical Procedure:Right  TKR Procedure(s):  ARTHROPLASTY, KNEE, TOTAL performed by  Lane Hacker, MD on 08/25/2013.      Patient's medical condition is appropriate for Physical Therapy  intervention at this time.  Patient cleared by nurse to participate in PT session, nursing reports.    Subjective: "I can walk further."  Patient is agreeable to participation in the therapy session.  Patient Goal:  (To go home)  Pain Assessment  Pain Assessment: No/denies pain          Objective:  Patient is in bed seen 1 Day Post-Op with OPI pain medicine.    Gross ROM  Right Upper Extremity ROM: within functional limits  Left Upper Extremity ROM: within functional limits  Right Lower Extremity ROM:  (5 to 75 degrees)  Left Lower Extremity ROM: within functional limits                                  Sensation is intact       Cognition  Arousal/Alertness: Appropriate responses to stimuli  Attention Span: Appears intact  Memory: Appears intact  Following Commands: Follows one step commands without difficulty  Safety Awareness: independent  Insights: Fully aware of deficits  Problem Solving: Able to problem solve independently      Functional Mobility  Supine to Sit: Min assist to right  Scooting: Min assist to right  Sit to Supine: Min assist to left  Sit to Stand: Min assist  Stand to Sit: Min assist    Transfers  Bed to Chair: Min assist to right  Chair to Bed: Min assist to  left  Stand Pivot Transfers: Minimal assistance    Locomotion  Ambulation: minimal assistance;with front-wheeled walker  Ambulation Distance (Feet):  (125' x 3)  Pattern: R decreased stance time  Weight Bearing Status: Total  Weight Bearing Percent:  (100%)    Therapeutic Exercise  Straight Leg Raise: 20 AA  Quad Sets: 25  Heelslides: 25  Glute Sets: 25  Hip Abduction: 25  Knee AROM : 5 to 75-80  Knee AROM Short Arc Quad: 25  Ankle Pumps: 25       Self Care and Home Management:  Lower body dressing: Patient performed  with minimal assistance    Educated the patient to role of physical therapy, plan of care, goals  of therapy and safety with mobility and ADLs.    Patient is seated in wheelchair in room with all needs provided and call bell within reach. RN notified of session outcome.    Assessment:   Assessment  Assessment: Decreased LE ROM;Decreased LE strength;Decreased endurance/activity tolerance;Decreased functional mobility;Decreased balance;Gait impairment  Prognosis: Good;With continued PT status post acute discharge.           Goals per  Eval/Re-eval:     Goals  Goal Formulation: With patient/family  Time for Goal Acheivement: 7 visits  Goals: Select goal  Pt Will Go Supine To Sit: Independently  Pt Will Perform Sit to Stand: Independently  Pt Will Transfer Bed/Chair: Independently  Pt Will Transfer to Toilet: With stand by assist  Pt Will Transfer to Tub/Shower: With stand by assist  PT Will Demonstrate Car Transfer Technique: With minimal assist  Pt Will Ambulate: with rolling walker;With stand by assist;101-150 feet  Pt Will Perform Home Exer Program: With minimal assist  Pt Will Perform LE Dressing w/Device: With min assist          Plan:  Recommendation  Discharge Recommendation: Home with home health PT;Home with outpatient PT  DME Recommended for Discharge:  (None-has equipment at home)  PT - Next Visit Recommendation: 08/26/13  PT Frequency: 7x/wk    Continue plan of care.      Signature: Lorenz Coaster, PT 08/26/2013 11:21 AM

## 2013-08-26 NOTE — Progress Notes (Signed)
[facility] PROGRESS NOTE    Date Time: 08/26/2013 8:49 AM  Patient Name: [name],[name]        Subjective:   "I'm doing pretty good."    Medications:      Scheduled Meds: PRN Meds:           amLODIPine 5 mg Oral Daily   [COMPLETED] ceFAZolin 1 g Intravenous Q8H   cloNIDine 0.1 mg Oral QPM   docusate sodium 100 mg Oral BID   gabapentin 300 mg Oral BID   hydrALAZINE 25 mg Oral Once   [COMPLETED] ketorolac 15 mg Intravenous Once   nebivolol 5 mg Oral QHS   tiotropium 18 mcg Inhalation QAM   traMADol 50 mg Oral Q6H SCH   [COMPLETED] tranexamic acid (CYKLOPRON) IVPB 1,000 mg Intravenous Once   [COMPLETED] warfarin 2.5 mg Oral Once   warfarin 2.5 mg Oral Daily at 1800   [DISCONTINUED] acetaminophen 650 mg Oral Q4H   [DISCONTINUED] amLODIPine 5 mg Oral Daily   [DISCONTINUED] aspirin EC 325 mg Oral Daily   [DISCONTINUED] ceFAZolin 2,000 mg Intravenous 30 Min Pre-Op   [DISCONTINUED] cloNIDine 0.1 mg Oral Daily   [DISCONTINUED] ketorolac 30 mg Intravenous Once   [DISCONTINUED] pregabalin 75 mg Oral BID   [DISCONTINUED] scopolamine 1 patch Transdermal Once   [DISCONTINUED] tranexamic acid (CYKLOPRON) IVPB 1,000 mg Intravenous See Admin Instructions         Continuous Infusions:       . sodium chloride 50 mL/hr at 08/26/13 0032   . bupivacaine 5 mL/hr (08/25/13 5621)   . lactated ringers           HYDROcodone-acetaminophen 1 tablet Q4H PRN   HYDROcodone-acetaminophen 2 tablet Q6H PRN   HYDROcodone-acetaminophen 2 tablet Q4H PRN   HYDROmorphone 0.5 mg Q1H PRN   hydrOXYzine 25 mg Q12H PRN   ondansetron 4 mg Q8H PRN   senna-docusate 2 tablet BID PRN   zolpidem 5 mg QHS PRN   [DISCONTINUED] bacitracin 50,000 units/normal saline irrigation 100,000 Units PRN   [DISCONTINUED] betaxolol  PRN   [DISCONTINUED] diphenhydrAMINE 6.5 mg Q6H PRN   [DISCONTINUED] fentaNYL 50 mcg Q5 Min PRN   [DISCONTINUED] HYDROcodone-acetaminophen 1 tablet Once PRN   [DISCONTINUED] HYDROmorphone 0.5 mg Q10 Min PRN   [DISCONTINUED] meperidine  25 mg Q2H PRN   [DISCONTINUED] promethazine 6.25 mg Once PRN   [DISCONTINUED] promethazine 6.25 mg Once PRN         I personally reviewed all of the medications      Review of Systems:   A comprehensive review of systems was obtained from chart review and the patient  General ROS: negative  Ophthalmic ROS: negative  Endocrine ROS: negative  Respiratory ROS: no cough, shortness of breath, or wheezing  Cardiovascular ROS: no chest pain or dyspnea on exertion  Gastrointestinal ROS: no abdominal pain, change in bowel habits  Genito-Urinary ROS: no c/o currently  Musculoskeletal ROS: right knee pain- currently controlled with pain meds  Neurological ROS: no TIA or stroke symptoms, denies dizziness      Physical Exam:     Filed Vitals:    08/26/13 0024 08/26/13 0449 08/26/13 0659 08/26/13 0817   BP: 188/81 194/84 201/84 136/64   Pulse: 65 54 55 63   Temp: 96.4 F (35.8 C) 96.1 F (35.6 C)  96.1 F (35.6 C)   TempSrc: Axillary Axillary     Resp: 18 18  18    Height:       Weight:  SpO2: 99% 100%  99%         Intake and Output Summary (Last 24 hours) at Date Time    Intake/Output Summary (Last 24 hours) at 08/26/13 0849  Last data filed at 08/26/13 0555   Gross per 24 hour   Intake   2300 ml   Output   1300 ml   Net   1000 ml       General appearance - alert, well appearing, and in no distress  Mental status - alert, oriented to person, place, and time  Chest - clear to auscultation, no wheezes, rales or rhonchi, symmetric air entry  Heart - normal rate, regular rhythm, normal S1, S2, no murmurs  Abdomen - bowel sounds normal, abdomen is soft, nontender, non distended, obese  Neurological - alert, oriented, normal speech, no sensation Bilat LE- s/p epidural anesthesia  Musculoskeletal - laying on bed  Extremities - peripheral pulses normal, no pedal edema, no clubbing or cyanosis    Consultant note reviewed.    Labs:       Lab 08/26/13 0439 08/21/13 1017   GLU 113* 94   BUN 15 15   CREAT 0.7 0.7   CA 8.6 9.6   NA 142  145   K 3.7 3.6   CL 107 105   CO2 26 27   ALB -- 3.6   PHOS -- --   MG -- --   EGFR >60.0 >60.0   PT 13.4 13.4   PTT -- 29   INR 1.0 1.0         Lab 08/21/13 1017   AST 19   ALT 17   ALKPHOS 72   ALB 3.6   BILITOTAL 0.5   AMY --   LIP --         Lab 08/26/13 0439 08/21/13 1017   WBC -- 8.09   HGB 10.4* 11.3*   HCT 33.8* 36.8*   MCV -- 86.4   MCH -- 26.5*   MCHC -- 30.7*   RDW -- 15   MPV -- 11.2       No results found for this basename: TSH,FREET3,FREET4 in the last 168 hours        Rads:     Radiology Results (24 Hour)     Procedure Component Value Units Date/Time    X-ray knee right AP and lateral [[id number]] Collected:08/25/13 1135    Order Status:Completed  Updated:08/25/13 1139    Narrative:    CLINICAL INDICATION: Right total knee arthroplasty,, hardware  evaluation.    FINDINGS: AP and lateral views of the right knee were obtained. The  right knee prothesis is in good position. There is no fracture or  dislocation. Postsurgical changes are seen in the soft tissues.       Impression:      Good position of the right knee prothesis. No fracture or  dislocation.    Wyatt Portela, MD   08/25/2013 11:35 AM                  Assessment and Plan:      Patient Active Problem List   Diagnosis   . Total knee replacement status- R TKA   . Low back pain   . Chronic obstructive pulmonary disease- continue meds   Incentive spirometry taught/reviewed with patient. Pt demonstrates adequate technique.  Morbid Obesity  HTN- follow BP. Elevated BP last night, IV fluids decreased. BP currently stable, continue meds.     Signed  by: Sheryle Hail, NP

## 2013-08-26 NOTE — PT Progress Note (Addendum)
Physical Therapy Treatment    Patient:  [name]        MRN#:  [id number]  Unit:  [facility] 4[facility]        Room/Bed:  M442/M442.01    Time of treatment: Start Time: 1230 Stop Time: 1330  Time Calculation (min): 60 min  PT Received On: 08/26/13    Treatment #: PT Visit Number: 2/7    Precautions  Weight Bearing Status: RLE WBAT    Medical Diagnosis:   Primary localized osteoarthrosis, lower leg, right [715.16] (Primary localized osteoarthrosis, lower leg, right [715.16])  Total knee replacement status  Surgical Procedure:Right   Procedure(s):  ARTHROPLASTY, KNEE, TOTAL performed by  Lane Hacker, MD on 08/25/2013.      Patient's medical condition is appropriate for Physical Therapy  intervention at this time.  Patient cleared by nurse to participate in PT session.    Subjective: I recover from surgeries very well.  Patient is agreeable to participation in the therapy session.  Patient Goal:  (To go home)  Pain Assessment  Pain Assessment: Numeric Scale (0-10)  Pain Score: 3-mild pain          Objective:  Patient is in wheelchair at bedside seen 1 Day Post-Op with dressings.    Gross ROM  Right Upper Extremity ROM: within functional limits  Left Upper Extremity ROM: within functional limits  Right Lower Extremity ROM:  (9-75-74)  Left Lower Extremity ROM: within functional limits                                  Sensation is intact       Cognition  Arousal/Alertness: Appropriate responses to stimuli  Attention Span: Appears intact  Memory: Appears intact  Following Commands: Follows one step commands without difficulty  Safety Awareness: independent  Insights: Fully aware of deficits  Problem Solving: Able to problem solve independently      Functional Mobility  Supine to Sit: Independent  Scooting: Independent  Sit to Supine: Independent  Sit to Stand: Independent  Stand to Sit: Independent    Transfers  Bed to Chair: Min assist to right  Chair to Bed: Min assist to left  Stand Pivot  Transfers: Minimal assistance    Locomotion  Ambulation: independently;with front-wheeled walker  Ambulation Distance (Feet): 200 Feet  Pattern: decreased cadence;decreased step length  Weight Bearing Status: Total  Weight Bearing Percent: 100   Stair Management: stand by assistance;one rail L;step to pattern;forward;with cane    Therapeutic Exercise  Straight Leg Raise: 10  Quad Sets: 25  Heelslides: 10  Glute Sets: 25  Hip Abduction: 10  Knee AROM : 5 to 75-80  Knee AROM Short Arc Quad: 10  Ankle Pumps: 25       Self Care and Home Management:  Toilet transfer technique: Patient performed independently, with verbal cues  Shower transfer technique: Patient performed  independently, with verbal cues  Car transfer technique: Patient performed  independently, with verbal cues    Educated the patient to role of physical therapy, plan of care, goals  of therapy and HEP, safety with mobility and ADLs, pursed lip breathing, weight bearing precautions, discharge instructions, home safety.    Patient is seated in wheelchair a bedside with all needs provided and call bell within reach. RN notified of session outcome, including pt cleared PT goals for discharge home.    Assessment:   Assessment  Assessment: Decreased LE ROM;Decreased LE strength;Gait impairment  Prognosis: Good;With continued PT status post acute discharge.           Goals per Eval/Re-eval:     Goals  Goal Formulation: With patient  Time for Goal Acheivement: 7 visits  Goals: Select goal  Pt Will Go Supine To Sit: Independently Goal met  Pt Will Perform Sit to Stand: IndependentlyGoal met  Pt Will Transfer Bed/Chair: IndependentlyGoal met  Pt Will Transfer to Toilet: With stand by assistGoal met  Pt Will Transfer to Tub/Shower: With stand by assistGoal met  PT Will Demonstrate Car Transfer Technique: With minimal assistGoal met  Pt Will Ambulate: with rolling walker;With stand by assist;101-150 feetGoal met  Pt Will Perform Home Exer Program: With minimal  assisGoal mett  Pt Will Perform LE Dressing w/Device: With min assist. Patient would like family to assist. Family demonstrated safe tech for dressing.          Plan:  Recommendation  Discharge Recommendation: Home with home health PT  DME Recommended for Discharge:  (None-has equipment at home)  PT - Next Visit Recommendation: 08/26/13  PT Frequency: 7x/wk    Discharge from Acute Care Services      Signature: Louann Liv, PTA 08/26/2013 2:48 PM

## 2013-08-26 NOTE — Discharge Instructions (Signed)
CASE MANAGEMENT DISCHARGE SUMMARY  Right Total Knee Replacement  08/25/13   Continue individual exercise program at home   Home Health Physical Therapy 3 times per wk, Provider: [facility] ph: [phone]  Monitor incision, remove dressing and  staples Post op day 10-14.   Notify MD if there is drainage from the incision after post op day 5.  Assess pain level every 2 to 4 hours and medicate accordingly. Contact MD for Rx refills   Ice and elevate to reduce swelling & discomfort.    No driving until OK with MD   Full weight bearing using device. Use walker or two crutches for one week,   may progress to cane if cleared by PT, continue use of cane until follow up appt with MD.  Outpatient PT 3 times per wk provider: ph: first appt: Take Rx to PT  OK to shower & allow water to run over dressing/incision then pat dry,if no drainage from the incision  [facility], [phone], will notify you w/date & time for follow-up appt.  Joanette Gula Case Manager, [place] [phone]     [facility] Hip and Knee Replacement Discharge Instructions    SWELLING  It is normal to have some swelling in your lower legs after surgery.  Walking every hour and doing your exercises will help strengthen your muscles and resolve the swelling.  If you have swelling, we r  MEDICATION: HYDROCODONE + ACETAMINOPHEN   Hydrocodone/acetaminophen (brand names: Vicodin, LorTab, Anexsia, Norco) is a narcotic pain medicine This medicine contains Tylenol (acetaminophen).  DIRECTIONS FOR USE:  If this medicine upsets your stomach, take it with food or milk. Pain medication should be taken only if needed at the times prescribed. If you are not having pain, do not take the medicine unless you are advised to do so by your doctor.  Do not take more than 4 grams (4,000 mg) of acetaminophen (Tylenol) per 24 hours.  WHAT TO WATCH FOR:  POSSIBLE SIDE EFFECTS: Dizziness, drowsiness (Take a smaller dose; for  example, break the pill in half or take it less often). Constipation (Drink lots of liquids, use small doses of a mild laxative like milk of magnesia, as needed). Nausea, vomiting (Take with food or milk). Difficulty passing urine (Stop the medicine and contact your doctor).  ALLERGIC REACTION: Rash, itching, swelling, trouble breathing or swallowing (Contact your doctor or return to this facility promptly).  MEDICAL CONDITIONS: Before starting this medicine, be sure your doctor knows if you have any of the following conditions:   Prostate enlargement   Lung disease (asthma, COPD)   Allergy to aspirin or other salicylates   Intestinal obstruction or severe constipation   Liver or kidney disease   History of substance abuse   Pregnancy or breastfeeding  DRUG INTERACTIONS: This drug may cause increased side effects when taken with alcohol, muscle relaxants, sedatives, tricyclic antidepressants, MAO inhibitors other pain medicines, other products containing acetaminophen (Tylenol), isoniazid (INH), ritonavir.  WARNINGS:   Do not drive, ride a bicycle, or operate dangerous equipment while taking this medicine until you know how it will affect you.   Prolonged use of this medicine can be habit forming and may lead to addiction.  [NOTE: This information topic may not include all directions, precautions, medical conditions, drug/food interactions, and warnings for this drug. Check with your doctor, nurse, or pharmacist for any questions that you may have.]   [address], [place], [place] [address].  All rights reserved. This information is not intended as a substitute for professional medical care. Always follow your healthcare professional's instructions.ecommend that you lie down every two hours, elevate your legs with pillows, and apply ice for 15 minutes.  If the swelling does not go away overnight, please call your doctors office.    SHOWERS  You are permitted to shower at home  as long as your incision is clean and intact.  If you have a clear plastic dressing, leave that in place for 10 days and just let the water run over the dressing. If you have a cloth or gauze dressing, remove it before the shower and replace it after the shower.  Do not soak in a tub or swim until you doctor says it's okay.    ACTIVITY  You will be using an assistive device ( walker, crutches or cane). Your physical therapist will help you with this. Most patients are able to get in and out of bed, use the restroom and go up and down stairs when they go home. It's a good idea to have someone with you in case you need help for the first week. We recommend that you get up and move around every 1-1.5 hours while you are awake. This will help with stiffness and reduce the chance of blood clots.    PAIN  You may continue to have pain or soreness for several weeks after surgery.  Gradually taper the frequency and amount of narcotic pain medication you are taking based on the amount of pain or soreness you have.  Extra strength acetaminophen can be used but should not exceed 4000mg  in one day.  Apply ice to the incision for 15 minutes after activity to help ease soreness.    MEDICATIONS  Most patients are discharged with several prescriptions. There are 2 main categories, pain pills and pills to help prevent blood clots.  Pain pills - take these if you need them. When taking narcotic pain medications, be sure to drink plenty of water and use stool softeners, such as Pericolace or Colace, as these pain medications can be constipating.  Blood Thinners - could be aspirin, coumadin, lovenox or mechanical pumps. Make sure you take the medicine properly or use the pumps as prescribed.  Vitron C - this is an over the counter medication that will help to build back your blood count after surgery. We recommend this for most patients.    CALLING THE OFFICE  Call the doctor if you have any additional questions or concerns. After  hours one of the fellows that helped with the surgery will return your call. During working hours the staff can answer simple questions. If you have concerns not addressed to your satisfaction by the staff then ask for your doctor's nurse to return your call.       MEDICATION: ZOFRAN  Zofran (generic name is ondansetron) is used to treat nausea and vomiting.  DIRECTIONS FOR USE:You may take this drug with or without food. Take as directed by your doctor.  WHAT TO WATCH FOR:  POSSIBLE SIDE EFFECTS: Diarrhea or constipation, headache, lightheaded, drowsy, blurred vision (If these symptoms persist or worsen contact your doctor). Chest pain (Contact your doctor or go to the Emergency Department promptly).  ALLERGIC REACTIONS: Rash, itching, swelling, trouble swallowing or breathing --> (Contact your doctor or go to the Emergency Department promptly.)  DRUG INTERACTIONS: Before starting this medicine, be sure your doctor knows if you are taking any of the following  drugs:  -- none  WARNINGS:  -- May cause drowsiness. DO NOT DRIVE, ride a bicycle or operate dangerous equipment while taking this medicine until you know how it will affect you.  -- Avoid alcohol use while taking this medicine. Side effects may increase with alcohol.  [NOTE: This information topic may not include all directions, precautions, medical conditions, drug/food interactions and warnings for this drug. Check with your doctor, nurse or pharmacist for any questions that you may have.]   [address], [place], [place] [address]. All rights reserved. This information is not intended as a substitute for professional medical care. Always follow your healthcare professional's instructions.  MEDICATION: DOCUSATE  Docusate (brand names include Colace, Correctol, DSS and others) is used to treat constipation. It usually gives relief in 1-3 days.  DIRECTIONS FOR USE:  -- Take this medicine at bedtime. Capsules can be taken with an 8  ounce glass of water or juice. The liquid form can be added to 4-8 ounces of juice or milk or formula.  -- Decrease the dose or stop the medicine if you get diarrhea.   -- Use this medicine only when needed. Do not use for more than 1 week unless told otherwise.   WHAT TO WATCH FOR:  POSSIBLE SIDE EFFECTS: Stomach pain, diarrhea or cramping, throat irritation from the liquid form --> (This should go away within a few hours after a dose. If symptoms persist, notify your doctor).   ALLERGIC REACTIONS: Rash, itching, swelling, trouble swallowing or breathing --> (Contact your doctor or return to this facility promptly).  ---------- IMPORTANT ----------  MEDICAL CONDITIONS: Before starting this medicine, be sure your doctor knows if you have any of the following conditions:  -- Severe abdominal pain or vomiting  -- Pregnancy or breast feeding  DRUG INTERACTIONS: Before starting this medicine, be sure your doctor knows if you are taking any of the following drugs:  -- Mineral Oil, aspirin  -- Laxatives containing phenolphthalein (such as Ex-Lax)  WARNINGS:  -- Notify your doctor if your condition worsens or fails to respond to this medicine.  [NOTE: This information topic may not include all directions, precautions, medical conditions, drug/food interactions and warnings for this drug. Check with your doctor, nurse or pharmacist for any questions that you may have.]   [address] [place], [place] [address]. All rights reserved. This information is not intended as a substitute for professional medical care. Always follow your healthcare professional's instructions.  MEDICATION: CELEBREX  Celebrex (generic: celecoxib) is a "selective" anti-inflammatory drug. "Selective" means that it is less likely to cause stomach upset or bleeding like older anti-inflammatory drugs like ibuprofen (Advil, Motrin) or naproxen (Aleve, Naprosyn). It is very useful for arthritis and other painful conditions  caused by inflammation.  DIRECTIONS FOR USE:  You may take this medicine with or without food. Taking it with food will reduce stomach upset. Do not take antacids (Mylanta, Maalox, and others) within two hours of this medicine.  WHAT TO WATCH FOR:  POSSIBLE SIDE EFFECTS: Upper abdominal pain, diarrhea, heartburn, dizziness, headache, insomnia, leg swelling (Contact your doctor if these symptoms persist or become severe). Bleeding from the stomach, which may appear as blood in vomit or stool (red or black color) (Contact your doctor or return to this facility promptly).  ALLERGIC REACTION: Rash, itching, swelling, trouble breathing or swallowing (Contact your doctor or return to this facility promptly).  MEDICAL CONDITIONS: Before starting this medicine, be sure your  doctor knows if you have any of the following conditions:   Stomach ulcer (active or in the past), history of vomiting blood or bloody stool   Allergic reaction to aspirin or other anti-inflammatory medicines, or sulfa drugs   Asthma, pregnancy or breastfeeding   Liver or kidney disease; congestive heart failure (CHF), heart disease, high blood pressure, bleeding disorder  DRUG INTERACTION: Before starting this medicine, be sure your doctor knows if you are taking any of the following drugs:   Coumadin (warfarin), Lasix (furosemide), thiazides (Diuril, Hydrodiuril), rifampin, lithium, aspirin, methotrexate   ACE inhibitors [Lotensin (benazepril); Capoten (captopril); Vasotec (enalapril); Monopril (fosinopril); Prinivil, Zestril (lisinopril); Accupril (quinapril); Altace (ramipril)]  WARNINGS:   Do not take with aspirin, prednisone, other anti-inflammatory drugs. Long termalcohol use andsmoking while taking this drug increases the risk of getting a bleeding ulcer.   May increase risk of serious and potentially fatal cardiovascular thrombotic events, MI, and stroke   Do not drive, ride a bicycle, or operate dangerous equipment while taking this  medicine until you know how it will affect you.  [NOTE: This information topic may not include all directions, precautions, medical conditions, drug/food interactions and warnings for this drug. Check with your doctor, nurse, or pharmacist for any questions that you may have.]   [address], [address], [place], [place] [address]. All rights reserved. This information is not intended as a substitute for professional medical care. Always follow your healthcare professional's instructions.  MEDICATION: ASPIRIN  Aspirin is useful for fever, pain, and inflammation. When taken in small doses every day, it also has the benefit of reducing the risk of heart attack and stroke. Buffered aspirin (brand names: Ascriptin, Ecotrin, and others) contains an antacid to reduce stomach irritation. Enteric-coated aspirin has a protective film that prevents it from dissolving until it passes out of the stomach. Both types of aspirin reduce the risk of a bleeding ulcer.  DIRECTIONS FOR USE:  Aspirin should be taken with a full glass of water, or with food or milk to reduce stomach upset.  Children and teenagers should not take aspirin while sick with chickenpox or any viral or flu-like illness since it may cause liver damage; use acetaminophen (Tylenol) instead.  WHAT TO WATCH FOR:  POSSIBLE SIDE EFFECTS: Nausea, upper abdominal pain (Take this medicine with food. Contact your doctor if these symptoms persist or become severe). Bleeding from the stomach [may appear as blood in vomit or stool (red or black color)], easy bruising, drowsiness, or repeated vomiting (Contact your doctor or return to this facility promptly).  ALLERGIC REACTIONS: Rash, itching, swelling, trouble breathing or swallowing (Contact your doctor or return to this facility promptly).  MEDICAL CONDITIONS: Before starting this medicine, be sure your doctor knows if you have any of the following conditions:   Stomach ulcer (active or in the past),  history of vomiting blood or bloody stool   Sensitivity to aspirin or anti-inflammatory medicines (ibuprofen, Motrin, Naprosyn, etc.)   Asthma, nasal polyps, angioedema, liver or kidney disease; bleeding disorder, pregnancy or breastfeeding  DRUG INTERACTIONS: Before starting this medicine, be sure your doctor knows if you are taking any of the following drugs:   Coumadin (warfarin), diabetes pills, prednisone, other anti-inflammatory drugs (ibuprofen, Motrin, Naprosyn, etc.), Axid, methotrexate, probenecid, Depakene (valproic acid)   ACE inhibitors [Lotensin (benazepril), Capoten (captopril), Vasotec (enalapril), and others]   Beta-blockers [Inderal (propranolol), Tenormin (atenolol), Lopressor (metoprolol), and others]  WARNINGS:   Daily aspirin use in medium or high doses carries  the risk of causing a bleeding ulcer. Buffered or enteric-coated aspirin helps prevent this problem.   Do not take this medicine with prednisone, other anti-inflammatory drugs, or alcohol since this increases the risk of a bleeding ulcer.  [NOTE: This information topic may not include all directions, precautions, medical conditions, drug/food interactions, and warnings for this drug. Check with your doctor, nurse, or pharmacist for any questions that you may have.]   [address], [address], [place], [place] [address]. All rights reserved. This information is not intended as a substitute for professional medical care. Always follow your healthcare professional's instruct  MEDICATION: TRAMADOL  You have been prescribed a pain medicine called tramadol (brand: Ultram).  DIRECTIONS FOR USE:  Take this medicine with a full glass of water, with or without food. Pain medicine should be taken only if needed at the times prescribed. Unless told otherwise, do not take the medicine if you are not having pain.  Oral Disintegrating Tablet (ODT):Take with or without food. Remove tablet from blister pack by peeling the foil  back. Do not try to push tablet through the foil.Do not break, chew, split, or crush the tablet. Place tablet in the mouth on the tongue. It will dissolve in one minute.  Extended-release tablets (ER): Take this medicine with a full glass of water, with or without food.Do not break, chew, dissolve, or crush tablets.  WHAT TO WATCH FOR:  POSSIBLE SIDE EFFECTS: Dizziness, drowsiness, or headache (Take a smaller dose or take it less often). Constipation (Drink lots of liquids, use small doses of a mild laxative like milk of magnesia, as needed). Nausea, vomiting (Take with food and a full glass of water). Nervousness, tremor, anxiety, sweating (Contact your doctor). Trouble passing urine, fast or irregular heartbeat, chest pain, fainting, blood in vomit or stools (red or black color), abdominal pain, yellow eyes or skin, dark urine, seizure (Contact your doctor or go to the Emergency Department).  ALLERGIC REACTION: Rash, itching, swelling, trouble breathing or swallowing (Contact your doctor or return to this facility promptly).  MEDICAL CONDITIONS: Before starting this medicine, be sure your doctor knows if you have any of the following conditions:   History of a seizure disorder (epilepsy)   History of alcohol or substance abuse   Kidney or liver disease, heart disease (arrhythmia, heart attack, or angina)   Lung disease (severe asthma, difficulty breathing)   History of depression or other psychiatric illness   Pregnancy or breastfeeding  DRUG INTERACTIONS: Before starting this medicine, be sure your doctor knows if you are taking any of the following medicines:   Medicine for sleep, anxiety, muscle spasm   Parkinson's medicines (rasagiline, selegiline)   Quinaglute (quinidine), Coumadin (warfarin), Lanoxin (digoxin), Tegretol (carbamazepine)   Antidepressants [Elavil (amitriptyline) and others], narcotic pain medicines   Psychiatric medicine [Thorazine (chlorpromazine), and others], antihistamines,  MAO inhibitors within 14 days [furazolidone, linezolid, procarbazine], St. John's wort  WARNINGS:   Do not drive, ride a bicycle, or operate dangerous equipment while taking this medicine until you know how this drug will affect you.   This drug may cause increased side effects when taken with alcohol, muscle relaxants, sedatives, anxiety medicines, antihistamines, antidepressants, MAO-inhibitors, seizure medicines, or another pain medicine.   Prolonged use of this medicine can be habit forming and may lead to addiction.    [NOTE: This information topic may not include all directions, precautions, medical conditions, drug/food interactions, and warnings for this drug. Check with your doctor, nurse, or pharmacist for  any questions that you may have.]   [address], [place], [place] [address]. All rights reserved. This information is not intended as a substitute for professional medical care. Always follow your healthcare professional's instructions.ions.

## 2013-08-26 NOTE — Progress Notes (Signed)
PROGRESS NOTE    Date Time: 08/26/2013 6:59 AM  Patient Name: [name], [name]      Assessment:    Right TKA 08/25/13    Plan:   Continue PT  Continue anticoagulation - Coumadin for history of DVT  D/c planning    Subjective:   Pain controlled, tolerating diet, participating in PT    Medications:     Current Facility-Administered Medications   Medication Dose Route Frequency   . amLODIPine  5 mg Oral Daily   . [COMPLETED] ceFAZolin  1 g Intravenous Q8H   . cloNIDine  0.1 mg Oral QPM   . docusate sodium  100 mg Oral BID   . gabapentin  300 mg Oral BID   . [COMPLETED] ketorolac  15 mg Intravenous Once   . nebivolol  5 mg Oral QHS   . tiotropium  18 mcg Inhalation QAM   . traMADol  50 mg Oral Q6H SCH   . [COMPLETED] tranexamic acid (CYKLOPRON) IVPB  1,000 mg Intravenous Once   . [COMPLETED] warfarin  2.5 mg Oral Once   . [DISCONTINUED] acetaminophen  650 mg Oral Q4H   . [DISCONTINUED] amLODIPine  5 mg Oral Daily   . [DISCONTINUED] aspirin EC  325 mg Oral Daily   . [DISCONTINUED] ceFAZolin  2,000 mg Intravenous 30 Min Pre-Op   . [DISCONTINUED] cloNIDine  0.1 mg Oral Daily   . [DISCONTINUED] ketorolac  30 mg Intravenous Once   . [DISCONTINUED] pregabalin  75 mg Oral BID   . [DISCONTINUED] scopolamine  1 patch Transdermal Once   . [DISCONTINUED] tranexamic acid (CYKLOPRON) IVPB  1,000 mg Intravenous See Admin Instructions       Physical Exam:     Filed Vitals:    08/26/13 0449   BP: 194/84   Pulse: 54   Temp: 96.1 F (35.6 C)   Resp: 18   SpO2: 100%       Intake and Output Summary (Last 24 hours) at Date Time    Intake/Output Summary (Last 24 hours) at 08/26/13 0659  Last data filed at 08/26/13 0555   Gross per 24 hour   Intake   2300 ml   Output   1300 ml   Net   1000 ml       General appearance - alert, well appearing, and in no distress  Musculoskeletal - Right knee  dressing C/D/I  Fires quad/TA/EHL/GSC  SILT SP/DP/TN  WWP distally  Calves soft, nontender bilateral    Physical Therapy:   Pain Score: 0-No pain (08/26/13  0449)  Right Lower Extremity ROM:  (10 to 75 degrees) (08/25/13 1315)  Left Lower Extremity ROM: within functional limits (08/25/13 1315)         Knee AROM : 5 to 75 (08/25/13 1315)    Labs:     Results     Procedure Component Value Units Date/Time    GFR [[id number]] Collected:08/26/13 0439     EGFR >60.0 Updated:08/26/13 0608    Narrative:    [facility] x2    Basic metabolic panel [[id number]]  (Abnormal) Collected:08/26/13 0439    Specimen Information:Blood Updated:08/26/13 0608     Glucose 113 (H) mg/dL      BUN 15 mg/dL      Creatinine 0.7 mg/dL      CALCIUM 8.6 mg/dL      Sodium 629 mEq/L      Potassium 3.7 mEq/L      Chloride 107 mEq/L      CO2 26 mEq/L  NarrativeAlla German x2    Protime-INR (QAM) [[id number]] Collected:08/26/13 [id number]    Specimen Information:Blood Updated:08/26/13 0559     PT 13.4      PT INR 1.0      PT Anticoag. Given Within 48 hrs. warfarin (Couma     Narrative:    [facility] x2    Hemoglobin and hematocrit, blood [[id number]]  (Abnormal) Collected:08/26/13 0439    Specimen Information:Blood Updated:08/26/13 0548     Hgb 10.4 (L) g/dL      Hematocrit 84.1 (L) %     Narrative:    [facility] x2          Recent CBC Recent Labs   Basename 08/26/13 0439    RBC --    HGB 10.4*    HCT 33.8*    LABPLAT --     Recent BMP Recent Labs   [facility] 08/26/13 0439    GLU 113*    BUN 15    CREAT 0.7    CA 8.6    NA 142    K 3.7    CL 107    CO2 26     Recent PT/INR Recent Labs   Basename 08/26/13 0439    INR 1.0       Rads:   Radiological Procedure reviewed.    Signed by: Garey Ham

## 2013-08-26 NOTE — Plan of Care (Signed)
Problem: Day 1 Post-op- Knee Surgery  Goal: Mobility/activity is maintained at optimum level for patient  Outcome: Adequate for Discharge  PT goals met per evaluation. Cleared  PT goals per plan of care.   Louann Liv, PTA 08/26/2013 2:52 PM

## 2013-08-26 NOTE — Progress Notes (Signed)
bp 194/84 hr 54 pt denies pain norvasc given  Earlier per house DR order

## 2013-08-26 NOTE — Plan of Care (Signed)
Problem: Health Promotion  Goal: Knowledge - health resources  Extent of understanding and conveyed about healthcare resources.   CM met with pt and family to discuss [place] plan  Pt plans to [place] [facility]   Transportation by family   PT will address equipment needs  Home Care and outpt referrals complete, [facility] and [facility]   Pt agreeable with plan

## 2013-08-26 NOTE — Anesthesia Postprocedure Evaluation (Signed)
Anesthesia Post Evaluation    Patient: [name]    Procedures performed: Procedure(s) with comments:  ARTHROPLASTY, KNEE, TOTAL    Anesthesia type: Combined Spinal/Epidural    Patient location:[facility] Floor    Last vitals:   Filed Vitals:    08/26/13 0817   BP: 136/64   Pulse: 63   Temp: 96.1 F (35.6 C)   Resp: 18   SpO2: 99%       Post pain: Patient not complaining of pain, continue current therapy      Mental Status:awake    Respiratory Function: tolerating room air    Cardiovascular: stable    Nausea/Vomiting: patient not complaining of nausea or vomiting    Hydration Status: adequate    Post assessment: no apparent anesthetic complications

## 2013-09-05 ENCOUNTER — Inpatient Hospital Stay: Payer: No Typology Code available for payment source | Admitting: Certified Registered"

## 2013-09-05 ENCOUNTER — Encounter: Payer: Self-pay | Admitting: Certified Registered"

## 2013-09-05 ENCOUNTER — Inpatient Hospital Stay
Admission: AD | Admit: 2013-09-05 | Discharge: 2013-10-03 | DRG: 464 | Disposition: A | Discharge status: Rehabilitation Facility | Payer: No Typology Code available for payment source | Source: Ambulatory Visit | Attending: Orthopaedic Surgery | Admitting: Orthopaedic Surgery

## 2013-09-05 ENCOUNTER — Encounter
Admission: AD | Disposition: A | Discharge status: Rehabilitation Facility | Payer: Self-pay | Source: Ambulatory Visit | Attending: Orthopaedic Surgery

## 2013-09-05 ENCOUNTER — Inpatient Hospital Stay: Payer: No Typology Code available for payment source | Admitting: Orthopaedic Surgery

## 2013-09-05 ENCOUNTER — Inpatient Hospital Stay: Admission: AD | Admit: 2013-09-05 | Payer: Self-pay | Source: Ambulatory Visit | Admitting: Orthopaedic Surgery

## 2013-09-05 ENCOUNTER — Inpatient Hospital Stay: Payer: No Typology Code available for payment source

## 2013-09-05 DIAGNOSIS — Z96659 Presence of unspecified artificial knee joint: Secondary | ICD-10-CM

## 2013-09-05 DIAGNOSIS — E876 Hypokalemia: Secondary | ICD-10-CM | POA: Diagnosis not present

## 2013-09-05 DIAGNOSIS — Y831 Surgical operation with implant of artificial internal device as the cause of abnormal reaction of the patient, or of later complication, without mention of misadventure at the time of the procedure: Secondary | ICD-10-CM | POA: Diagnosis present

## 2013-09-05 DIAGNOSIS — T8450XA Infection and inflammatory reaction due to unspecified internal joint prosthesis, initial encounter: Principal | ICD-10-CM | POA: Diagnosis present

## 2013-09-05 DIAGNOSIS — R262 Difficulty in walking, not elsewhere classified: Secondary | ICD-10-CM | POA: Diagnosis present

## 2013-09-05 DIAGNOSIS — M545 Low back pain, unspecified: Secondary | ICD-10-CM | POA: Diagnosis present

## 2013-09-05 DIAGNOSIS — R700 Elevated erythrocyte sedimentation rate: Secondary | ICD-10-CM | POA: Diagnosis present

## 2013-09-05 DIAGNOSIS — D62 Acute posthemorrhagic anemia: Secondary | ICD-10-CM | POA: Diagnosis not present

## 2013-09-05 DIAGNOSIS — J4489 Other specified chronic obstructive pulmonary disease: Secondary | ICD-10-CM | POA: Diagnosis present

## 2013-09-05 DIAGNOSIS — IMO0002 Reserved for concepts with insufficient information to code with codable children: Secondary | ICD-10-CM | POA: Diagnosis not present

## 2013-09-05 DIAGNOSIS — K5900 Constipation, unspecified: Secondary | ICD-10-CM | POA: Diagnosis not present

## 2013-09-05 DIAGNOSIS — I959 Hypotension, unspecified: Secondary | ICD-10-CM | POA: Diagnosis not present

## 2013-09-05 DIAGNOSIS — I10 Essential (primary) hypertension: Secondary | ICD-10-CM | POA: Diagnosis present

## 2013-09-05 DIAGNOSIS — Z6841 Body Mass Index (BMI) 40.0 and over, adult: Secondary | ICD-10-CM

## 2013-09-05 DIAGNOSIS — Y658 Other specified misadventures during surgical and medical care: Secondary | ICD-10-CM | POA: Diagnosis not present

## 2013-09-05 DIAGNOSIS — Z87891 Personal history of nicotine dependence: Secondary | ICD-10-CM

## 2013-09-05 DIAGNOSIS — B964 Proteus (mirabilis) (morganii) as the cause of diseases classified elsewhere: Secondary | ICD-10-CM | POA: Diagnosis present

## 2013-09-05 DIAGNOSIS — E8779 Other fluid overload: Secondary | ICD-10-CM | POA: Diagnosis not present

## 2013-09-05 DIAGNOSIS — T8189XA Other complications of procedures, not elsewhere classified, initial encounter: Secondary | ICD-10-CM | POA: Diagnosis not present

## 2013-09-05 HISTORY — PX: ARTHROPLASTY, KNEE, REVISION TOTAL: SHX3131

## 2013-09-05 LAB — BASIC METABOLIC PANEL
BUN: 13 mg/dL (ref 7–21)
CO2: 27 mEq/L (ref 22–29)
Calcium: 9.1 mg/dL (ref 7.9–10.6)
Chloride: 104 mEq/L (ref 98–107)
Creatinine: 0.7 mg/dL (ref 0.6–1.0)
Glucose: 101 mg/dL — ABNORMAL HIGH (ref 70–100)
Potassium: 3.9 mEq/L (ref 3.5–5.1)
Sodium: 143 mEq/L (ref 136–145)

## 2013-09-05 LAB — CBC AND DIFFERENTIAL
Basophils Absolute Automated: 0.02 (ref 0.00–0.20)
Basophils Automated: 0 %
Eosinophils Absolute Automated: 0.35 (ref 0.00–0.70)
Eosinophils Automated: 3 %
Hematocrit: 28.8 % — ABNORMAL LOW (ref 37.0–47.0)
Hgb: 9.1 g/dL — ABNORMAL LOW (ref 12.0–16.0)
Immature Granulocytes Absolute: 0.03
Immature Granulocytes: 0 %
Lymphocytes Absolute Automated: 1.97 (ref 0.50–4.40)
Lymphocytes Automated: 17 %
MCH: 27 pg — ABNORMAL LOW (ref 28.0–32.0)
MCHC: 31.6 g/dL — ABNORMAL LOW (ref 32.0–36.0)
MCV: 85.5 fL (ref 80.0–100.0)
MPV: 10.3 fL (ref 9.4–12.3)
Monocytes Absolute Automated: 1.03 (ref 0.00–1.20)
Monocytes: 9 %
Neutrophils Absolute: 8.36 — ABNORMAL HIGH (ref 1.80–8.10)
Neutrophils: 71 %
Nucleated RBC: 0 (ref 0–1)
Platelets: 249 (ref 140–400)
RBC: 3.37 — ABNORMAL LOW (ref 4.20–5.40)
RDW: 15 % (ref 12–15)
WBC: 11.73 — ABNORMAL HIGH (ref 3.50–10.80)

## 2013-09-05 LAB — PT/INR
PT INR: 1.1 (ref 0.9–1.1)
PT: 14 (ref 12.6–15.0)

## 2013-09-05 LAB — SEDIMENTATION RATE: Sed Rate: 70 — ABNORMAL HIGH (ref 0–20)

## 2013-09-05 LAB — TYPE AND SCREEN
AB Screen Gel: NEGATIVE
ABO Rh: B POS

## 2013-09-05 LAB — GFR: EGFR: >60

## 2013-09-05 LAB — APTT: PTT: 31 (ref 23–37)

## 2013-09-05 LAB — C-REACTIVE PROTEIN: C-Reactive Protein: 9.3 mg/dL — ABNORMAL HIGH (ref 0.0–0.5)

## 2013-09-05 SURGERY — ARTHROPLASTY, KNEE, REVISION TOTAL
Anesthesia: Regional | Site: Knee | Laterality: Right | Wound class: Dirty or Infected

## 2013-09-05 MED ORDER — CELECOXIB 200 MG PO CAPS
200.0000 mg | ORAL_CAPSULE | Freq: Once | ORAL | Status: AC
Start: 2013-09-05 — End: 2013-09-05
  Administered 2013-09-05: 200 mg via ORAL
  Filled 2013-09-05: qty 1

## 2013-09-05 MED ORDER — MEPERIDINE HCL 25 MG/ML IJ SOLN
25.0000 mg | INTRAMUSCULAR | Status: DC | PRN
Start: 2013-09-05 — End: 2013-09-06

## 2013-09-05 MED ORDER — ONDANSETRON 4 MG PO TBDP
4.0000 mg | ORAL_TABLET | Freq: Three times a day (TID) | ORAL | Status: DC | PRN
Start: 2013-09-05 — End: 2013-10-03
  Administered 2013-09-30 – 2013-10-02 (×2): 4 mg via ORAL
  Filled 2013-09-05 (×2): qty 1

## 2013-09-05 MED ORDER — HYDROCODONE-ACETAMINOPHEN 5-325 MG PO TABS
2.0000 | ORAL_TABLET | ORAL | Status: DC | PRN
Start: 2013-09-05 — End: 2013-09-06
  Administered 2013-09-05 – 2013-09-06 (×2): 2 via ORAL
  Filled 2013-09-05 (×2): qty 2

## 2013-09-05 MED ORDER — TRAMADOL HCL 50 MG PO TABS
50.0000 mg | ORAL_TABLET | Freq: Four times a day (QID) | ORAL | Status: DC | PRN
Start: 2013-09-05 — End: 2013-09-06
  Administered 2013-09-05 – 2013-09-06 (×2): 50 mg via ORAL
  Filled 2013-09-05 (×2): qty 1

## 2013-09-05 MED ORDER — HYDROCODONE-ACETAMINOPHEN 5-325 MG PO TABS
1.0000 | ORAL_TABLET | ORAL | Status: DC | PRN
Start: 2013-09-05 — End: 2013-09-06

## 2013-09-05 MED ORDER — VANCOMYCIN HCL 1000 MG IV SOLR
1500.0000 mg | INTRAVENOUS | Status: DC
Start: 2013-09-05 — End: 2013-09-06
  Administered 2013-09-05: 1.5 g via INTRAVENOUS
  Filled 2013-09-05: qty 1500

## 2013-09-05 MED ORDER — LACTATED RINGERS IV SOLN
INTRAVENOUS | Status: DC | PRN
Start: 2013-09-05 — End: 2013-09-05

## 2013-09-05 MED ORDER — METOCLOPRAMIDE HCL 5 MG/ML IJ SOLN
INTRAMUSCULAR | Status: DC | PRN
Start: 2013-09-05 — End: 2013-09-05
  Administered 2013-09-05: 20 mg via INTRAVENOUS

## 2013-09-05 MED ORDER — ONDANSETRON HCL 4 MG/2ML IJ SOLN
INTRAMUSCULAR | Status: DC | PRN
Start: 2013-09-05 — End: 2013-09-05
  Administered 2013-09-05: 4 mg via INTRAVENOUS

## 2013-09-05 MED ORDER — LACTATED RINGERS IV SOLN
INTRAVENOUS | Status: DC
Start: 2013-09-05 — End: 2013-09-06

## 2013-09-05 MED ORDER — SODIUM CHLORIDE 0.9 % IV SOLN
1000.0000 mg | Freq: Once | INTRAVENOUS | Status: AC
Start: 2013-09-05 — End: 2013-09-05
  Administered 2013-09-05: 1000 mg via INTRAVENOUS
  Filled 2013-09-05: qty 10

## 2013-09-05 MED ORDER — HYDROMORPHONE HCL PF 1 MG/ML IJ SOLN
0.5000 mg | INTRAMUSCULAR | Status: DC | PRN
Start: 2013-09-05 — End: 2013-09-06
  Administered 2013-09-05 (×3): 0.5 mg via INTRAVENOUS
  Filled 2013-09-05: qty 1

## 2013-09-05 MED ORDER — FAMOTIDINE 10 MG/ML IV SOLN (WRAP)
INTRAVENOUS | Status: DC | PRN
Start: 2013-09-05 — End: 2013-09-05
  Administered 2013-09-05: 20 mg via INTRAVENOUS

## 2013-09-05 MED ORDER — FENTANYL CITRATE 0.05 MG/ML IJ SOLN
INTRAMUSCULAR | Status: DC | PRN
Start: 2013-09-05 — End: 2013-09-05
  Administered 2013-09-05: 25 ug via INTRATHECAL
  Administered 2013-09-05: 75 ug via INTRAVENOUS

## 2013-09-05 MED ORDER — AMLODIPINE BESYLATE 5 MG PO TABS
5.0000 mg | ORAL_TABLET | Freq: Every day | ORAL | Status: DC
Start: 2013-09-06 — End: 2013-09-26
  Administered 2013-09-06 – 2013-09-26 (×19): 5 mg via ORAL
  Filled 2013-09-05 (×22): qty 1

## 2013-09-05 MED ORDER — POVIDONE-IODINE 5 % OP SOLN
Freq: Once | OPHTHALMIC | Status: DC
Start: 2013-09-05 — End: 2013-09-15
  Filled 2013-09-05: qty 30

## 2013-09-05 MED ORDER — OXYCODONE-ACETAMINOPHEN 5-325 MG PO TABS
1.0000 | ORAL_TABLET | Freq: Once | ORAL | Status: AC | PRN
Start: 2013-09-05 — End: 2013-09-05

## 2013-09-05 MED ORDER — POVIDONE-IODINE 5 % OP SOLN
OPHTHALMIC | Status: DC | PRN
Start: 2013-09-05 — End: 2013-09-05
  Administered 2013-09-05: 4 [drp]

## 2013-09-05 MED ORDER — BACITRACIN 50000 UNITS IM SOLR
INTRAMUSCULAR | Status: DC | PRN
Start: 2013-09-05 — End: 2013-09-05
  Administered 2013-09-05 (×3): 100000 [IU]

## 2013-09-05 MED ORDER — VANCOMYCIN HCL 1000 MG IV SOLR
1000.0000 mg | Freq: Once | INTRAVENOUS | Status: DC
Start: 2013-09-05 — End: 2013-09-05

## 2013-09-05 MED ORDER — CELECOXIB 200 MG PO CAPS
200.0000 mg | ORAL_CAPSULE | Freq: Every day | ORAL | Status: DC
Start: 2013-09-06 — End: 2013-10-03
  Administered 2013-09-06 – 2013-10-03 (×26): 200 mg via ORAL
  Filled 2013-09-05 (×26): qty 1

## 2013-09-05 MED ORDER — MIDAZOLAM HCL 5 MG/5ML IJ SOLN
INTRAMUSCULAR | Status: AC
Start: 2013-09-05 — End: ?
  Filled 2013-09-05: qty 5

## 2013-09-05 MED ORDER — PROPOFOL INFUSION 10 MG/ML
INTRAVENOUS | Status: DC | PRN
Start: 2013-09-05 — End: 2013-09-05
  Administered 2013-09-05: 75 ug/kg/min via INTRAVENOUS

## 2013-09-05 MED ORDER — DIPHENHYDRAMINE HCL 50 MG/ML IJ SOLN
6.2500 mg | Freq: Four times a day (QID) | INTRAMUSCULAR | Status: DC | PRN
Start: 2013-09-05 — End: 2013-09-06

## 2013-09-05 MED ORDER — HYDROMORPHONE HCL PF 1 MG/ML IJ SOLN
0.5000 mg | INTRAMUSCULAR | Status: DC | PRN
Start: 2013-09-05 — End: 2013-09-06
  Administered 2013-09-05: 0.5 mg via INTRAVENOUS
  Filled 2013-09-05: qty 1

## 2013-09-05 MED ORDER — ACETAMINOPHEN 325 MG PO TABS
650.0000 mg | ORAL_TABLET | ORAL | Status: AC
Start: 2013-09-05 — End: 2013-09-06
  Administered 2013-09-05 (×2): 650 mg via ORAL
  Filled 2013-09-05: qty 2

## 2013-09-05 MED ORDER — MIDAZOLAM HCL 5 MG/ML IJ SOLN
INTRAMUSCULAR | Status: DC | PRN
Start: 2013-09-05 — End: 2013-09-05
  Administered 2013-09-05: 2 mg via INTRAVENOUS
  Administered 2013-09-05: 1 mg via INTRAVENOUS

## 2013-09-05 MED ORDER — BUPIVACAINE HCL (PF) 0.75 % IJ SOLN
INTRAMUSCULAR | Status: DC | PRN
Start: 2013-09-05 — End: 2013-09-05
  Administered 2013-09-05: 1.8 mL via INTRASPINAL

## 2013-09-05 MED ORDER — FENTANYL CITRATE 0.05 MG/ML IJ SOLN
INTRAMUSCULAR | Status: AC
Start: 2013-09-05 — End: ?
  Filled 2013-09-05: qty 2

## 2013-09-05 MED ORDER — HYDROCHLOROTHIAZIDE 25 MG PO TABS
25.0000 mg | ORAL_TABLET | Freq: Every day | ORAL | Status: DC
Start: 2013-09-06 — End: 2013-10-03
  Administered 2013-09-06 – 2013-10-03 (×25): 25 mg via ORAL
  Filled 2013-09-05 (×26): qty 1

## 2013-09-05 MED ORDER — TRANEXAMIC ACID 100 MG/ML IV SOLN
1000.0000 mg | Freq: Once | INTRAVENOUS | Status: AC
Start: 2013-09-05 — End: 2013-09-05
  Administered 2013-09-05: 1000 mg via INTRAVENOUS
  Filled 2013-09-05: qty 10

## 2013-09-05 MED ORDER — GABAPENTIN 300 MG PO CAPS
300.0000 mg | ORAL_CAPSULE | Freq: Two times a day (BID) | ORAL | Status: DC
Start: 2013-09-05 — End: 2013-10-03
  Administered 2013-09-05 – 2013-10-03 (×53): 300 mg via ORAL
  Filled 2013-09-05 (×53): qty 1

## 2013-09-05 MED ORDER — ONDANSETRON HCL 4 MG/2ML IJ SOLN
4.0000 mg | Freq: Once | INTRAMUSCULAR | Status: DC | PRN
Start: 2013-09-05 — End: 2013-09-06

## 2013-09-05 MED ORDER — ASPIRIN 325 MG PO TBEC
325.0000 mg | DELAYED_RELEASE_TABLET | Freq: Every day | ORAL | Status: DC
Start: 2013-09-06 — End: 2013-09-15
  Administered 2013-09-06 – 2013-09-13 (×8): 325 mg via ORAL
  Filled 2013-09-05 (×9): qty 1

## 2013-09-05 MED ORDER — FENTANYL CITRATE 0.05 MG/ML IJ SOLN
25.0000 ug | INTRAMUSCULAR | Status: DC | PRN
Start: 2013-09-05 — End: 2013-09-06

## 2013-09-05 MED ORDER — DOCUSATE SODIUM 100 MG PO CAPS
100.0000 mg | ORAL_CAPSULE | Freq: Two times a day (BID) | ORAL | Status: DC
Start: 2013-09-05 — End: 2013-10-03
  Administered 2013-09-05 – 2013-10-03 (×49): 100 mg via ORAL
  Filled 2013-09-05 (×51): qty 1

## 2013-09-05 MED ORDER — TIOTROPIUM BROMIDE MONOHYDRATE 18 MCG IN CAPS
18.0000 ug | ORAL_CAPSULE | Freq: Every morning | RESPIRATORY_TRACT | Status: DC
Start: 2013-09-06 — End: 2013-09-11
  Administered 2013-09-07 – 2013-09-11 (×4): 18 ug via RESPIRATORY_TRACT
  Filled 2013-09-05: qty 5

## 2013-09-05 MED ORDER — METOCLOPRAMIDE HCL 5 MG/ML IJ SOLN
10.0000 mg | Freq: Once | INTRAMUSCULAR | Status: DC | PRN
Start: 2013-09-05 — End: 2013-09-06

## 2013-09-05 MED ORDER — HYDROCODONE-ACETAMINOPHEN 5-325 MG PO TABS
1.0000 | ORAL_TABLET | ORAL | Status: DC | PRN
Start: 2013-09-05 — End: 2013-09-05

## 2013-09-05 MED ORDER — CLONIDINE HCL 0.1 MG PO TABS
0.1000 mg | ORAL_TABLET | Freq: Every day | ORAL | Status: DC
Start: 2013-09-06 — End: 2013-10-03
  Administered 2013-09-06 – 2013-10-03 (×28): 0.1 mg via ORAL
  Filled 2013-09-05 (×28): qty 1

## 2013-09-05 SURGICAL SUPPLY — 78 items
APPLCATOR CHLORAPREP 26ML (Prep) ×4 IMPLANT
BANDAGE CMPR VLCR PLSTR CTTN MED MTRX 15 (Bandage) ×1
BANDAGE MEDIUM COMPRESSION L15 YD X W6 IN ELASTIC VELCRO POLYESTER (Bandage) ×1 IMPLANT
BANDAGE MEDLINE MEDIUM COMPRESSION L15 (Bandage) ×1
BOWL CEMENT GUN BATCH W NOZZLE (Ortho Supply) ×2 IMPLANT
CONTAINER SPEC LLDPE 16OZ W LF NS LEK (Procedure Accessories) ×6
CONTAINER SPECIMEN 4 OZ STRL (Procedure Accessories) ×6 IMPLANT
CONTAINER SPECIMEN C16 OZ WIDE LEAK SHATTER RESISTANT SNAP ON LID (Procedure Accessories) ×3 IMPLANT
CUFF TOURNIQUET CYLINDRICAL L30 IN X W4 IN 2 PORT 1 BLADDER QUICK (Procedure Accessories) IMPLANT
CUFF TRNQT CYL CLR CUF 30X4IN LF STRL 2 (Procedure Accessories)
DIFFUSER AIR (Procedure Accessories) ×2 IMPLANT
DRAPE SRG PLS U STRDRP 51X47IN LF STRL (Drape) ×1
DRAPE STERI LARGE W/TOWEL (Drape) ×2 IMPLANT
DRAPE SURGICAL ADHESIVE L51 IN X W47 IN (Drape) ×1
DRAPE SURGICAL ADHESIVE L51 IN X W47 IN STERI-DRAPE CLEAR (Drape) ×1 IMPLANT
DRESSING TELFA 3X8IN STERILE (Dressing) ×2 IMPLANT
ELECTRODE ELECTROSRGCL BLADE STD L2.5IN MEGADYNE E-Z CLEAN PTFE MNPL (Blade) ×1 IMPLANT
ELECTRODE ELECTROSURGICAL BLADE STANDARD (Blade) ×1
ELECTRODE ESURG PTFE BLDE STD MEGADYNE (Blade) ×1
GLOVE SURG BIOGEL INDIC SZ 8.5 (Glove) ×2 IMPLANT
GLOVE SURG BIOGEL ORTHO SZ8.5 (Glove) ×8 IMPLANT
GOWN X-LRG POLY REINFORCED (Gown) ×2 IMPLANT
HANDPIECE INTERPLUSE HIGH FLOW (Cautery) ×2 IMPLANT
HOLDER CATH VLCR UNV CATH-MATE LF ADJ (Procedure Accessories) ×1
HOLDER CATHETER UNIVERSAL ADJUSTABLE (Procedure Accessories) ×1 IMPLANT
HOOD T4 STERI-SHIELD (Personal Protection) ×8 IMPLANT
INSERT TIB X3 3 TRTHLN 9MM LF STRL CNDRL (Insert) ×1 IMPLANT
INSERT TIBIAL 3 TRIATHLON H9 MM KNEE (Insert) ×1 IMPLANT
INSERT TIBIAL 3 TRIATHLON H9 MM KNEE CONDYLAR STABILIZE BEARING X3 (Insert) ×1 IMPLANT
KIT DRAINAGE L12.5 IN ROUND 3 SPRING (Drain) ×2
KIT DRAINAGE L12.5 IN ROUND 3 SPRING EVACUATOR Y CONNECTOR TUBE HOLE (Drain) ×2 IMPLANT
KIT DRN PVC 400CC RND 1/8IN 12.5IN LF (Drain) ×1
KIT DRN PVC 400CC RND 3/16IN 12.5IN LF (Drain) ×1
MANIFOLD 4 PORT NEPTUNE FILTER (Filter) ×2 IMPLANT
NEEDLE SPINAL DISP 18GX3.5IN (Needles) ×2 IMPLANT
PAD ELECTROSRG GRND REM W CRD (Procedure Accessories) ×2 IMPLANT
PADDING CAST L4 YD X W4 IN UNDERCAST (Cast) ×1
PADDING CAST L4 YD X W4 IN UNDERCAST MILD STRETCH COHESIVE REGULAR (Cast) ×1 IMPLANT
PADDING CAST L4 YD X W6 IN UNDERCAST (Cast) ×1
PADDING CAST L4 YD X W6 IN UNDERCAST MILD STRETCH COHESIVE WEBRIL (Cast) ×1 IMPLANT
PADDING CAST SYNTHETIC 4INX4YD (Cast) ×2 IMPLANT
PADDING CST CTTN WBRL 4YDX4IN LF STRL (Cast) ×1
PADDING CST CTTN WBRL 4YDX6IN LF STRL (Cast) ×1
SOL ALCOHOL ISOPROPYL 70% 4 OZ (Prep) ×2 IMPLANT
SOLUTION IRR 0.9% NACL 3L URMTC LF PLS (Irrigation Solutions) ×4
SOLUTION IRRIGATION 0.9% SODIUM CHLORIDE 3000 ML PLASTIC CONTAINER (Irrigation Solutions) ×2 IMPLANT
STAPLER SKIN L3.9 MM X W6.9 MM WIDE 35 (Skin Closure) ×1
STAPLER SKIN L3.9 MM X W6.9 MM WIDE 35 COUNT FIX HEAD RATCHET (Skin Closure) ×1 IMPLANT
STAPLER SKN SS W PRX PX .58MM 3.9X6.9MM (Skin Closure) ×1
STRAP POSITIONING L19 IN X W3.5 IN (Procedure Accessories) ×1
STRAP POSITIONING L19 IN X W3.5 IN STIRRUP SLIP RING TECLIN (Procedure Accessories) ×1 IMPLANT
STRAP PSTN TECLIN 19X3.5IN LF NS STRUP (Procedure Accessories) ×1
SUTURE ABS 1 CP COAT VCL 27IN BRD UD (Suture)
SUTURE ABS 2-0 SH MNCRL 27IN MFL UD (Suture) ×2
SUTURE COATED VICRYL 1 CP L27 IN BRAID (Suture)
SUTURE COATED VICRYL 1 CP L27 IN BRAID UNDYED ABSORBABLE (Suture) IMPLANT
SUTURE MONOCRYL 2-0 SH L27 IN (Suture) ×2
SUTURE MONOCRYL 2-0 SH L27 IN MONOFILAMENT UNDYED ABSORBABLE (Suture) ×2 IMPLANT
SUTURE PDS II 2-0 CT1 8X18IN (Suture) ×12 IMPLANT
SYRINGE 20 ML BD LUER-LOK MEDICAL (Syringes, Needles) ×1 IMPLANT
SYRINGE BULB 60CC LATEX FREE (Syringes, Needles) ×2 IMPLANT
SYRINGE MED 20ML LL LF STRL (Syringes, Needles) ×2
TIP SCT VNYL ARG YNKR 18FR 25CM LF STRL (Tubing) ×1
TIP SUCTION ARGYLE YANKAUER OD18 FR L25 CM VINYL SMOOTH INNER LUMEN (Tubing) ×1 IMPLANT
TIP SUCTION OD18 FR L25 CM VINYL SMOOTH (Tubing) ×1
TOOL DISSECTING L14 CM BALL FLUTE OD4 MM (Drillbits) ×1
TOOL DISSECTING L14 CM BALL FLUTE OD4 MM MIDAS REX LEGEND BURR (Drillbits) ×1 IMPLANT
TOOL DSCT LGND 4MM 14CM (Drillbits) ×1
TOURNIQUET 34X4 PURPLE (Procedure Accessories) IMPLANT
TOURNIQUET 44X4 BLUE (Procedure Accessories) IMPLANT
TOWEL L26 IN X W17 IN COTTON PREWASH (Procedure Accessories) ×3
TOWEL L26 IN X W17 IN COTTON PREWASH DELINT BLUE ACTISORB SURGICAL (Procedure Accessories) ×3 IMPLANT
TOWEL SRG CTTN 26X17IN LF STRL PREWASH (Procedure Accessories) ×3
TRAY CATH FOLEY SILICONE 16FR (Catheter Urine) ×4 IMPLANT
TRAY TOTAL KNEE PACK (Tray) ×2 IMPLANT
TUBE CULTURE STURTS LIQ BBL (Procedure Accessories) ×2 IMPLANT
WATER STERILE PVC FREE DEHP FREE 1000 ML (Solution) ×2 IMPLANT
WATER STRL 1000ML PIC LF PVC FR DEHP-FR (Solution) ×2

## 2013-09-05 NOTE — Interval H&P Note (Signed)
The history and physical exam completed by internal medicine consultant for this patient has been reviewed. There are interval changes to the patient's status noted. These include the following: draining and pain in the right knee status post total knee replacement. The patient appears ready to proceed with surgery. The patient has been given the opportunity to ask any additional questions regarding the proposed procedure.  The patient appears ready to proceed as planned. All questions were answered.The history and physical exam completed by internal medicine consultant for this patient has been reviewed. There are interval changes to the patient's status noted. These include the following: wound infection. The patient appears ready to proceed with surgery. The patient has been given the opportunity to ask any additional questions regarding the proposed procedure.  The patient appears ready to proceed as planned. All questions were answered.

## 2013-09-05 NOTE — Anesthesia Preprocedure Evaluation (Addendum)
Anesthesia Evaluation    AIRWAY    Mallampati: II    TM distance: >3 FB  Neck ROM: full  Mouth Opening:full   CARDIOVASCULAR    regular and normal       DENTAL    No notable dental hx     PULMONARY    clear to auscultation     OTHER FINDINGS                      Anesthesia Plan    ASA 3     CSE               (WBC slightly elevated,Pt afebrile. Recommend CSE. Will remove CSE post op )      intravenous induction   Detailed anesthesia plan: CSE        Post op pain management: per surgeon    informed consent obtained    ECG reviewed  pertinent labs reviewed

## 2013-09-05 NOTE — Transfer of Care (Signed)
Anesthesia Transfer of Care Note    Patient: [name]    Procedures performed: Procedure(s) with comments:  ARTHROPLASTY, KNEE, REVISION TOTAL - RT KNEE WASHOUT & POLY EXCHANGE **POA/CH** (STRYKER)     Anesthesia type: Combined Spinal/Epidural    Patient location:Phase I PACU    Last vitals:   Filed Vitals:    09/05/13 1449   BP: 167/75   Pulse: 55   Temp: 97.8 F (36.6 C)   Resp: 12   SpO2: 100%       Post pain: Patient not complaining of pain, continue current therapy      Mental Status:awake    Respiratory Function: tolerating nasal cannula    Cardiovascular: stable    Nausea/Vomiting: patient not complaining of nausea or vomiting    Hydration Status: adequate    Post assessment: no apparent anesthetic complications

## 2013-09-05 NOTE — H&P (View-Only) (Signed)
[facility] PROGRESS NOTE    Date Time: 08/26/2013 8:49 AM  Patient Name: [name],[name]        Subjective:   "I'm doing pretty good."    Medications:      Scheduled Meds: PRN Meds:           amLODIPine 5 mg Oral Daily   [COMPLETED] ceFAZolin 1 g Intravenous Q8H   cloNIDine 0.1 mg Oral QPM   docusate sodium 100 mg Oral BID   gabapentin 300 mg Oral BID   hydrALAZINE 25 mg Oral Once   [COMPLETED] ketorolac 15 mg Intravenous Once   nebivolol 5 mg Oral QHS   tiotropium 18 mcg Inhalation QAM   traMADol 50 mg Oral Q6H SCH   [COMPLETED] tranexamic acid (CYKLOPRON) IVPB 1,000 mg Intravenous Once   [COMPLETED] warfarin 2.5 mg Oral Once   warfarin 2.5 mg Oral Daily at 1800   [DISCONTINUED] acetaminophen 650 mg Oral Q4H   [DISCONTINUED] amLODIPine 5 mg Oral Daily   [DISCONTINUED] aspirin EC 325 mg Oral Daily   [DISCONTINUED] ceFAZolin 2,000 mg Intravenous 30 Min Pre-Op   [DISCONTINUED] cloNIDine 0.1 mg Oral Daily   [DISCONTINUED] ketorolac 30 mg Intravenous Once   [DISCONTINUED] pregabalin 75 mg Oral BID   [DISCONTINUED] scopolamine 1 patch Transdermal Once   [DISCONTINUED] tranexamic acid (CYKLOPRON) IVPB 1,000 mg Intravenous See Admin Instructions         Continuous Infusions:       . sodium chloride 50 mL/hr at 08/26/13 0032   . bupivacaine 5 mL/hr (08/25/13 5621)   . lactated ringers           HYDROcodone-acetaminophen 1 tablet Q4H PRN   HYDROcodone-acetaminophen 2 tablet Q6H PRN   HYDROcodone-acetaminophen 2 tablet Q4H PRN   HYDROmorphone 0.5 mg Q1H PRN   hydrOXYzine 25 mg Q12H PRN   ondansetron 4 mg Q8H PRN   senna-docusate 2 tablet BID PRN   zolpidem 5 mg QHS PRN   [DISCONTINUED] bacitracin 50,000 units/normal saline irrigation 100,000 Units PRN   [DISCONTINUED] betaxolol  PRN   [DISCONTINUED] diphenhydrAMINE 6.5 mg Q6H PRN   [DISCONTINUED] fentaNYL 50 mcg Q5 Min PRN   [DISCONTINUED] HYDROcodone-acetaminophen 1 tablet Once PRN   [DISCONTINUED] HYDROmorphone 0.5 mg Q10 Min PRN   [DISCONTINUED] meperidine  25 mg Q2H PRN   [DISCONTINUED] promethazine 6.25 mg Once PRN   [DISCONTINUED] promethazine 6.25 mg Once PRN         I personally reviewed all of the medications      Review of Systems:   A comprehensive review of systems was obtained from chart review and the patient  General ROS: negative  Ophthalmic ROS: negative  Endocrine ROS: negative  Respiratory ROS: no cough, shortness of breath, or wheezing  Cardiovascular ROS: no chest pain or dyspnea on exertion  Gastrointestinal ROS: no abdominal pain, change in bowel habits  Genito-Urinary ROS: no c/o currently  Musculoskeletal ROS: right knee pain- currently controlled with pain meds  Neurological ROS: no TIA or stroke symptoms, denies dizziness      Physical Exam:     Filed Vitals:    08/26/13 0024 08/26/13 0449 08/26/13 0659 08/26/13 0817   BP: 188/81 194/84 201/84 136/64   Pulse: 65 54 55 63   Temp: 96.4 F (35.8 C) 96.1 F (35.6 C)  96.1 F (35.6 C)   TempSrc: Axillary Axillary     Resp: 18 18  18    Height:       Weight:  SpO2: 99% 100%  99%         Intake and Output Summary (Last 24 hours) at Date Time    Intake/Output Summary (Last 24 hours) at 08/26/13 0849  Last data filed at 08/26/13 0555   Gross per 24 hour   Intake   2300 ml   Output   1300 ml   Net   1000 ml       General appearance - alert, well appearing, and in no distress  Mental status - alert, oriented to person, place, and time  Chest - clear to auscultation, no wheezes, rales or rhonchi, symmetric air entry  Heart - normal rate, regular rhythm, normal S1, S2, no murmurs  Abdomen - bowel sounds normal, abdomen is soft, nontender, non distended, obese  Neurological - alert, oriented, normal speech, no sensation Bilat LE- s/p epidural anesthesia  Musculoskeletal - laying on bed  Extremities - peripheral pulses normal, no pedal edema, no clubbing or cyanosis    Consultant note reviewed.    Labs:       Lab 08/26/13 0439 08/21/13 1017   GLU 113* 94   BUN 15 15   CREAT 0.7 0.7   CA 8.6 9.6   NA 142  145   K 3.7 3.6   CL 107 105   CO2 26 27   ALB -- 3.6   PHOS -- --   MG -- --   EGFR >60.0 >60.0   PT 13.4 13.4   PTT -- 29   INR 1.0 1.0         Lab 08/21/13 1017   AST 19   ALT 17   ALKPHOS 72   ALB 3.6   BILITOTAL 0.5   AMY --   LIP --         Lab 08/26/13 0439 08/21/13 1017   WBC -- 8.09   HGB 10.4* 11.3*   HCT 33.8* 36.8*   MCV -- 86.4   MCH -- 26.5*   MCHC -- 30.7*   RDW -- 15   MPV -- 11.2       No results found for this basename: TSH,FREET3,FREET4 in the last 168 hours        Rads:     Radiology Results (24 Hour)     Procedure Component Value Units Date/Time    X-ray knee right AP and lateral [[id number]] Collected:08/25/13 1135    Order Status:Completed  Updated:08/25/13 1139    Narrative:    CLINICAL INDICATION: Right total knee arthroplasty,, hardware  evaluation.    FINDINGS: AP and lateral views of the right knee were obtained. The  right knee prothesis is in good position. There is no fracture or  dislocation. Postsurgical changes are seen in the soft tissues.       Impression:      Good position of the right knee prothesis. No fracture or  dislocation.    Wyatt Portela, MD   08/25/2013 11:35 AM                  Assessment and Plan:      Patient Active Problem List   Diagnosis   . Total knee replacement status- R TKA   . Low back pain   . Chronic obstructive pulmonary disease- continue meds   Incentive spirometry taught/reviewed with patient. Pt demonstrates adequate technique.  Morbid Obesity  HTN- follow BP. Elevated BP last night, IV fluids decreased. BP currently stable, continue meds.     Signed  by: Sheryle Hail, NP

## 2013-09-05 NOTE — Op Note (Addendum)
FULL OPERATIVE NOTE    Date Time: 09/05/2013 6:07 PM  Patient Name: [name]  Attending Physician: Lane Hacker, MD      Date of Operation:   09/05/2013    Providers Performing:   Lane Hacker, M.D.    Assistant (s): Graylin Shiver    Preoperative Diagnosis:   Right knee periprosthetic joint infection    Postoperative Diagnosis:   RIght knee periprosthetic joint infection    Operative Procedure:   #1) Right knee irrigation & debridement  #2) Right knee polyethylene component exchange    Anesthesia:   Spinal, epidural    Estimated Blood Loss:   100    Fluids:   See anesthesia    Specimens:   Deep tissue x 3 to microbiology  Deep fluid (joint) x 1 to microbiology    Complications:   None, stable to PACU.    Implants Utilized:   Triathlon Size 3 tibia 9mm      Indications:   This is a 77 year old Female who is s/p a Right total knee arthroplasty several weeks prior.  The patient presented to our clinic with worsening knee pain, swelling, and a history of fevers.  Additionally elevated infection labs with CRP 9.3 and ESR 70.  Culture from the aspiration is pending but a diagnosis of periprosthetic joint infection was made.  Options were explained to the patient and at this point we decided to proceed with Right knee irrigation & debridement, polyethylene exchange.  It was explained that the patient could continue to have infection in the joint following an extended course of intravenous antibiotics, and this may require further treatment.  The patient demonstrated an understanding of this.      Operative Notes:   Prior to initiation of the operative procedure a surgical time-out was taken and the patient's name, medical record, number, date-of-birth, and operative side was verified with the surgical consent form.  Additionally it was verified that the appropriate peri-operative antibiotic administration was completed, as well as that radiographs were available and the correct operative instrumentation  and implants were available.    After routine preparation and draping of the patient in the total joint operating room, the esmark bandage was utilized to exsanguinate the extremity and the tourniquet inflated.  A midline skin incision was made over the knee joint.  The medial border of the patella, tibial tubercle, and VMO were clearly identified.  A medial parapatellar approach was made to the Right knee joint.     Upon arthrotomy of the knee there was no frank purulence but a small rent in the inferomedial capsule noted in the knee joint.    The knee was placed in full extension, homan retractors placed medial and lateral over the distal femur and complete synovectomy done to reestablish the medial and lateral gutters.  Subsequently, the knee was flexed to 90 degrees, a rake retractor placed over the medial soft tissues and a medial release performed using electrocautery.  A cobb elevator was used to complete the medial release at the level of the joint line to the posterior aspect of the proximal tibia.      An osteotome was then utilized to remove the polyethylene component without causing damage to the tibial or femoral component.  Subsequently a homan retractor was then placed in the posterior aspect of the knee and the proximal tibial surface exposed.  Loose tissue was excised with a rongeur, subcutaneous tissue was debrided and a synovectomy was performed.  The proximal tibial surface scrubbed with a betadine sponge.  At this point the retractors were removed.   No bone or muscle was removed.      Subsequently the femur was exposed with blunt homan retractors.  Again, loose necrotic tissue was removed from the borders of the component using electrocautery and a rongeur was used to perform an excisional debridement of the necrotic synovium, the femoral component scrubbed with a betadine sponge. Finally, with a sponge placed for protection so as to minimize damage to the components a laminar spreader was  placed kocher was utilized to complete a posterior synovectomy.     Then, pulse irrigation was used to clean the intraarticular space with 9 L of antibiotic-impregnated saline solution.  All staff changed gloves and a sterile drape was placed.  Subsequently the knee was trialed with a 9mm polyethylene spacer and noted to be stable throughout the range of motion.  A final component size 9mm was then placed into the knee joint.      The wound was closed in layers using vicryl suture, monocryl suture, and skin staples.  Prior to closing the skin with staples, the skin edges were excised using a sharp scalpel.  A superficial surgical drain was placed prior to closure of the superficial tissues.  A soft compression dressing was applied.  The patient was taken to the post-anesthesia care unit without complication.     The first-assistant was critical to all steps of the operation, including retraction and leg stabilization during exposure and bone preparation, as well as the deep and superficial wound closure.  Dr. Ahmed Prima performed and/or supervised the key portions of this surgical procedure, including evaluation of the bone cuts, reshaping of the bony elements, and insertion of the provisional and final components.      Signed by: Garey Ham, MD

## 2013-09-05 NOTE — Anesthesia Procedure Notes (Signed)
Combined Spinal epidural    Patient location during procedure: pre-opReason for block: Primary Anesthesia In the OR    Start time: 09/05/2013 12:23 PM  End time: 09/05/2013 12:33 PMPreanesthetic Checklist Completed: patient identified, surgical consent, pre-op evaluation, timeout performed, risks and benefits discussed, monitors and equipment checked, anesthesia consent given and correct site  Timeout Completed:  09/05/2013 12:23 PM  CSE  Patient monitoring: Pulse oximetry, NIBP and Nasal cannula O2  Premedication: Yes    Patient position: sitting  Sterile Technique: ChloraPrep, sterile drape, mask  and sterile gloves  Skin Local: lidocaine 2%  Interspace: L3-4  Number of attempts: 1    Approach: midline    Injection technique: LOR saline  Needle type: Touhy needle     Needle gauge: 17  CSF Return: No  Blood Return: No  Intrathecal space entered with Pencan  Needle Gauge 25 G  CSF Return: Yes   Blood Return:No  Paresthesia Pain: no    Catheter type: side hole    Catheter size: 20 G    Catheter at skin depth: 9 cm  CSF Return: No  Blood Return: No      Pressure Paresthesia: no  No Catheter IV/SA Signs or Symptoms    Assessment   Sensory level: T10  Block Outcome: patient tolerated procedure well, no complications, successful block and pain improved

## 2013-09-06 MED ORDER — SODIUM CHLORIDE 0.9 % IV MBP
1.0000 g | INTRAVENOUS | Status: DC
Start: 2013-09-06 — End: 2013-09-06
  Administered 2013-09-06: 1 g via INTRAVENOUS
  Filled 2013-09-06 (×2): qty 1000

## 2013-09-06 MED ORDER — ASPIRIN 325 MG PO TBEC
325.0000 mg | DELAYED_RELEASE_TABLET | Freq: Every day | ORAL | Status: DC
Start: 2013-09-06 — End: 2013-09-06

## 2013-09-06 MED ORDER — VANCOMYCIN HCL 1000 MG IV SOLR
1000.0000 mg | Freq: Two times a day (BID) | INTRAVENOUS | Status: DC
Start: 2013-09-06 — End: 2013-09-06
  Administered 2013-09-06: 1000 mg via INTRAVENOUS

## 2013-09-06 MED ORDER — VANCOMYCIN HCL 1000 MG IV SOLR
1000.0000 mg | Freq: Two times a day (BID) | INTRAVENOUS | Status: DC
Start: 2013-09-06 — End: 2013-09-06
  Filled 2013-09-06 (×2): qty 1000

## 2013-09-06 MED ORDER — HYDROCODONE-ACETAMINOPHEN 5-325 MG PO TABS
1.0000 | ORAL_TABLET | ORAL | Status: DC | PRN
Start: 2013-09-06 — End: 2013-10-03
  Administered 2013-09-11 – 2013-09-28 (×3): 1 via ORAL
  Filled 2013-09-06 (×5): qty 1

## 2013-09-06 MED ORDER — HYDROMORPHONE HCL PF 1 MG/ML IJ SOLN
0.5000 mg | INTRAMUSCULAR | Status: DC | PRN
Start: 2013-09-06 — End: 2013-10-03
  Administered 2013-09-06 – 2013-09-21 (×12): 0.5 mg via INTRAVENOUS
  Filled 2013-09-06 (×11): qty 1

## 2013-09-06 MED ORDER — SODIUM CHLORIDE 0.9 % IV MBP
2.0000 g | Freq: Three times a day (TID) | INTRAVENOUS | Status: DC
Start: 2013-09-06 — End: 2013-09-08
  Administered 2013-09-06 – 2013-09-08 (×6): 2 g via INTRAVENOUS
  Filled 2013-09-06 (×8): qty 2

## 2013-09-06 MED ORDER — SODIUM CHLORIDE 0.9 % IV SOLN
INTRAVENOUS | Status: DC
Start: 2013-09-06 — End: 2013-09-06

## 2013-09-06 MED ORDER — SENNOSIDES-DOCUSATE SODIUM 8.6-50 MG PO TABS
2.0000 | ORAL_TABLET | Freq: Two times a day (BID) | ORAL | Status: DC | PRN
Start: 2013-09-06 — End: 2013-10-03
  Administered 2013-09-07 – 2013-09-14 (×2): 2 via ORAL
  Filled 2013-09-06 (×2): qty 2

## 2013-09-06 MED ORDER — ZOLPIDEM TARTRATE 5 MG PO TABS
5.0000 mg | ORAL_TABLET | Freq: Every evening | ORAL | Status: DC | PRN
Start: 2013-09-06 — End: 2013-10-03

## 2013-09-06 MED ORDER — KETOROLAC TROMETHAMINE 30 MG/ML IJ SOLN
15.0000 mg | Freq: Once | INTRAMUSCULAR | Status: AC
Start: 2013-09-06 — End: 2013-09-06
  Administered 2013-09-06: 15 mg via INTRAVENOUS
  Filled 2013-09-06: qty 1

## 2013-09-06 MED ORDER — DOCUSATE SODIUM 100 MG PO CAPS
100.0000 mg | ORAL_CAPSULE | Freq: Two times a day (BID) | ORAL | Status: DC
Start: 2013-09-06 — End: 2013-09-06

## 2013-09-06 MED ORDER — HYDROCODONE-ACETAMINOPHEN 5-325 MG PO TABS
2.0000 | ORAL_TABLET | Freq: Four times a day (QID) | ORAL | Status: DC | PRN
Start: 2013-09-06 — End: 2013-10-03
  Administered 2013-09-06 – 2013-09-22 (×11): 2 via ORAL
  Filled 2013-09-06 (×17): qty 2

## 2013-09-06 MED ORDER — TRAMADOL HCL 50 MG PO TABS
50.0000 mg | ORAL_TABLET | Freq: Four times a day (QID) | ORAL | Status: DC
Start: 2013-09-06 — End: 2013-10-03
  Administered 2013-09-06 – 2013-10-03 (×93): 50 mg via ORAL
  Filled 2013-09-06 (×97): qty 1

## 2013-09-06 MED ORDER — KETOROLAC TROMETHAMINE 30 MG/ML IJ SOLN
30.0000 mg | Freq: Once | INTRAMUSCULAR | Status: DC
Start: 2013-09-06 — End: 2013-09-06

## 2013-09-06 MED ORDER — PREGABALIN 75 MG PO CAPS
75.0000 mg | ORAL_CAPSULE | Freq: Two times a day (BID) | ORAL | Status: DC
Start: 2013-09-06 — End: 2013-09-06

## 2013-09-06 MED ORDER — VANCOMYCIN HCL 1000 MG IV SOLR
1250.0000 mg | Freq: Two times a day (BID) | INTRAVENOUS | Status: DC
Start: 2013-09-06 — End: 2013-09-08
  Administered 2013-09-06 – 2013-09-08 (×4): 1250 mg via INTRAVENOUS
  Filled 2013-09-06 (×6): qty 1250

## 2013-09-06 MED ORDER — ONDANSETRON HCL 4 MG/2ML IJ SOLN
4.0000 mg | Freq: Three times a day (TID) | INTRAMUSCULAR | Status: DC | PRN
Start: 2013-09-06 — End: 2013-10-03
  Administered 2013-09-07 – 2013-09-29 (×5): 4 mg via INTRAVENOUS
  Filled 2013-09-06 (×5): qty 2

## 2013-09-06 MED ORDER — HYDROCODONE-ACETAMINOPHEN 5-325 MG PO TABS
2.0000 | ORAL_TABLET | ORAL | Status: DC | PRN
Start: 2013-09-06 — End: 2013-10-03
  Administered 2013-09-06 – 2013-10-02 (×49): 2 via ORAL
  Filled 2013-09-06 (×43): qty 2

## 2013-09-06 MED ORDER — HYDROXYZINE PAMOATE 25 MG PO CAPS
25.0000 mg | ORAL_CAPSULE | Freq: Two times a day (BID) | ORAL | Status: DC | PRN
Start: 2013-09-06 — End: 2013-10-03

## 2013-09-06 NOTE — PT Eval Note (Signed)
[facility]  [address]  [place] [address]  [phone]    Physical Therapy Evaluation    Patient: [name] MRN: [id number]   Unit: [place] [facility] 4C Bed: M442/M442.01    Orders   PT Order: PT Evaluation and Treat  WB Status: WBAT   Precautions: with knee in extension. NO ROM of the knee  Exercises: (Active) and (Passive)  Consult received for [name] for PT evaluation and treatment.  Patient's medical condition is appropriate for Physical Therapy intervention at this time.    Chart Review   History of Present Illness: [name] is a 77 y.o. female admitted on 09/05/2013   Medical Diagnosis: Infection of total knee replacement, initial encounter [996.66, V43.65] (INFECTED RT KNEE)  Total knee replacement status  Surgical Diagnosis: Right ARTHROPLASTY, KNEE, REVISION TOTAL performed by  Lane Hacker, MD on 09/05/2013.  Postoperative Diagnosis:    RIght knee periprosthetic joint infection   Operative Procedure:    #1) Right knee irrigation & debridement   #2) Right knee polyethylene component exchange      Patient Active Problem List   Diagnosis   . Total knee replacement status   . Low back pain   . Chronic obstructive pulmonary disease     Past Medical History   Diagnosis Date   . Hypertension    . Pneumonia    . Chronic obstructive pulmonary disease    . Headache(784.0)      not since surgery in 1957   . Arthritis    . Difficulty in walking(719.7)    . Low back pain      Past Surgical History   Procedure Date   . Cervical spine surgery    . Lumbar spine surgery posterior w/ hardware   . Tonsillectomy    . Brain surgery R side/ 1957 Optic nerve pressure   . Eye surgery      cataracts   . Colonoscopy    . Tubal ligation 1961   . Arthroplasty, knee, total 08/25/2013     Procedure: ARTHROPLASTY, KNEE, TOTAL;  Surgeon: Lane Hacker, MD;  Location: [facility];  Service: Orthopedics;  Laterality: Right;       Ht Readings from Last 3 Encounters:   09/05/13  1.6 m (5\' 3" )   09/05/13 1.6 m (5\' 3" )   08/20/13 1.6 m (5\' 3" )     Wt Readings from Last 3 Encounters:   09/05/13 112 kg (246 lb 14.6 oz)   09/05/13 112 kg (246 lb 14.6 oz)   08/20/13 102.059 kg (225 lb)       Recent Labs   Basename 09/05/13 1203    HGB 9.1*    HCT 28.8*        Filed Vitals:    09/06/13 0613   BP: 152/67   Pulse: 74   Temp: 98.6 F (37 C)   Resp: 18   SpO2: 98%       Social History     Prior Level of Function  Prior level of function: Ambulates with assistive device  Assistive Device: Front wheel walker (rollator)  Baseline Activity Level: Community ambulation  DME Currently at Home: Four wheel walker;Single point cane;Crutches    Home Living Arrangements  Living Arrangements: Children (Debra: dtr/coach)  Home Layout:  (3 STE with rail, chair lift to bedroom level)  Bathroom Shower/Tub: Pension scheme manager: Raised  Bathroom Equipment: Built-in shower seat  DME  Currently at Home: Four wheel walker;Single point cane;Crutches    Subjective    Patient is agreeable to participation in the therapy session. Nursing clears patient for therapy.  Patient Goal  Patient Goal:  (dancing)    Pain Assessment  Pain Assessment: Numeric Scale (0-10)  Pain Score: 5-moderate pain (7/10 prior to pain meds)  Pain Location: Foot;Knee  Pain Orientation: Right;Posterior  Pain Descriptors: Cramping  Pain Frequency: Continuous;Constant/continuous  Effect of Pain on Daily Activities: moderate  Patient's Stated Comfort Functional Goal: 3-mild pain  Pain Intervention(s): Medication (See eMAR)     Functional Limits  Pain Level Best:  (0)  Pain Level Worst: 10    Objective   Patient is in bed with IV and hinged brace locked in extension, seen 1 Day Post-Op.    Observation of patient:  Inspection/Posture  Inspection/Posture:  (supine)  Cognition  Arousal/Alertness: Appropriate responses to stimuli  Attention Span: Appears intact  Memory: Appears intact  Following Commands: Follows all commands and directions without  difficulty  Safety Awareness: independent  Insights: Fully aware of deficits  Problem Solving: Able to problem solve independently         Musculoskeletal Examination  Gross ROM  Right Upper Extremity ROM: within functional limits  Left Upper Extremity ROM: within functional limits  Right Lower Extremity ROM:  (locked in extension)  Left Lower Extremity ROM: within functional limits              Sensation is intact     Gross Strength  Right Upper Extremity Strength: within functional limits  Left Upper Extremity Strength: within functional limits  Right Lower Extremity Strength:  (able to SLR in brace)  Left Lower Extremity Strength: within functional limits                Functional Mobility  Supine to Sit: Mod assist to left  Scooting: Mod assist to left  Sit to Stand: Min assist  Stand to Sit:  (CGA)              Locomotion  Ambulation: with front-wheeled walker;minimal assistance  Ambulation Distance (Feet): 10 Feet  Pattern: decreased step length;decreased cadence (wide BOS)    Therapeutic Exercises:       Participation and Endurance  Participation Effort: good  Endurance: Tolerates 10 - 20 min exercise with multiple rests    Educated the patient to role of physical therapy, plan of care, goals of therapy and safety with mobility and ADLs.    Patient is seated in wheelchair at nursing station  with all needs provided and call bell within reach. RN notified of session outcome.    Assessment   [name] is a 77 y.o. female admitted 09/05/2013. Pt presents with  Right ARTHROPLASTY, KNEE, REVISION TOTAL. Patient present with deficits in strength, function, ambulation and ADLS.  Pt would benefit from skilled Physical Therapy Services.  Assessment  Assessment: Decreased LE strength;Gait impairment;Impaired motor control;Decreased functional mobility  Prognosis: Good    Goals  Goal Formulation: With patient  Time for Goal Acheivement: 7 visits  Goals: Select goal  Pt Will Go Supine To Sit: With stand by assist  Pt  Will Perform Sit to Stand: With stand by assist  Pt Will Transfer to Toilet: Independently  Pt Will Transfer to Tub/Shower: With minimal assist  PT Will Demonstrate Car Transfer Technique: With minimal assist  Pt Will Ambulate: 101-150 feet;With stand by assist  Pt Will Go Up / Down Stairs: 3-5 stairs;With stand by  assist  Pt Will Perform Home Exer Program: Independently  Pt Will Perform LE Dressing w/Device: With min assist    Rehabilitation Potential  Prognosis: Good    Plan      Plan  Risks/Benefits/POC Discussed with Pt/Family: With patient  Patient Goal:  (dancing)  Treatment/Interventions: Exercise;Gait training;Bed mobility;Patient/family training  PT Frequency: 7x/wk    Recommendation  Discharge Recommendation: Home with home health PT  PT - Next Visit Recommendation: 09/07/13  PT Frequency: 7x/wk    Signature: Mathews Argyle, PT 10:08 AM

## 2013-09-06 NOTE — Anesthesia Postprocedure Evaluation (Signed)
Anesthesia Post-op Evaluation POD1    Patient: [name]    Procedures performed: Procedure(s) with comments:  ARTHROPLASTY, KNEE, REVISION TOTAL - RT KNEE WASHOUT & POLY EXCHANGE **POA/CH** (STRYKER)     Anesthesia type: Regional Anesthesia,  Combined Spinal/Epidural    Patient location: Floor 4A  4B    Last vitals:   BP 152/67  Pulse 74  Temp 98.6 F (37 C) (Temporal Artery)  Resp 18  Ht 1.6 m (5\' 3" )  Wt 112 kg (246 lb 14.6 oz)  BMI 43.75 kg/m2  SpO2 98%     Mental Status:awake and alert     Respiratory Function: tolerating room air    Cardiovascular: stable    Nausea/Vomiting: patient not complaining of nausea or vomiting    Hydration Status: adequate    Post assessment: no apparent anesthetic complications, no reportable events,  neuro exam WNL, no headache

## 2013-09-06 NOTE — Consults (Signed)
INFECTIOUS DISEASES CONSULTATION  [facility]  [phone] [facility] [address].   [address]  [place], [place] [phone]      Date Time: 09/06/2013 2:45 PM  Patient Name: [name]  Requesting Physician: Sheila Hacker, MD      Reason for Consultation:   PJI  RIGHT KNEE     Assessment:     Patient Active Problem List   Diagnosis   . Total knee replacement status   . Low back pain   . Chronic obstructive pulmonary disease     The patient is a 77 y.o. female admitted on 09/05/2013 with prolonged drainage from the right total knee (pod 11).      On 11/28, pt underwent I and D with poly exchange.  Preliminary OR cxs have grown gnr's.      Recommendation:   Would recommend treating with ceftazidime and vanco  Pending further cx results.   [place]in ceftriaxone    Anticipate 6 wks of treatment and likely placement of a picc line.           History of Pesent Illness:   [name] is a 77 y.o. female with oa who underwent  An original right total knee replacement.  On 08/25/13.    Pt tolerated the procedure well and was discharged  Home the following day.    She had prolonged drainage from the right total knee  Wound. She noted some dusky erythema around the wound  She did develop a bit of inc swelling, but did not  Experience any fevers or chills       On follow up visit with [name], pt was noted to have  Excessive bloody drainage from  the right knee wound.   Accordingly, the pt was taken back to the  OR on 09/05/13 for Right Knee I and D and Right Knee  Poly Exchange.      Preliminary wound cxs have grown gram negative   Rods.      We are asked to make further recommendations re  abx therapy.      Note - hx obtained from pt's daughter     Medications:     Current Facility-Administered Medications   Medication Dose Route Frequency   . [EXPIRED] acetaminophen  650 mg Oral Q4H   . amLODIPine  5 mg Oral Daily   . aspirin EC  325 mg Oral Daily   . cefTRIAXone  1 g Intravenous Q24H SCH   .  celecoxib  200 mg Oral Daily   . [COMPLETED] celecoxib  200 mg Oral Once   . cloNIDine  0.1 mg Oral Daily   . docusate sodium  100 mg Oral Q12H SCH   . gabapentin  300 mg Oral BID   . hydrochlorothiazide  25 mg Oral Daily   . [COMPLETED] ketorolac  15 mg Intravenous Once   . povidone-iodine   Both Eyes Once   . tiotropium  18 mcg Inhalation QAM   . traMADol  50 mg Oral Q6H SCH   . [COMPLETED] tranexamic acid (CYKLOPRON) IVPB  1,000 mg Intravenous Once   . vancomycin  1,250 mg Intravenous Q12H   . [DISCONTINUED] aspirin EC  325 mg Oral Daily   . [DISCONTINUED] docusate sodium  100 mg Oral BID   . [DISCONTINUED] ketorolac  30 mg Intravenous Once   . [DISCONTINUED] pregabalin  75 mg Oral BID   . [DISCONTINUED] vancomycin  1,000 mg Intravenous Q12H SCH   . [  DISCONTINUED] vancomycin  1,000 mg Intravenous Q12H   . [DISCONTINUED] vancomycin  1,500 mg Intravenous 90 Min Pre-Op       Antibiotics:   Ancef and Vancomycin IV----------->   fortaz and vancomycin    Allergies:   No Known Allergies    Past Medical History:     Past Medical History   Diagnosis Date   . Hypertension    . Pneumonia    . Chronic obstructive pulmonary disease    . Headache(784.0)      not since surgery in 1957   . Arthritis    . Difficulty in walking(719.7)    . Low back pain        Past Surgical History:     Past Surgical History   Procedure Date   . Cervical spine surgery    . Lumbar spine surgery posterior w/ hardware   . Tonsillectomy    . Brain surgery R side/ 1957 Optic nerve pressure   . Eye surgery      cataracts   . Colonoscopy    . Tubal ligation 1961   . Arthroplasty, knee, total 08/25/2013     Procedure: ARTHROPLASTY, KNEE, TOTAL;  Surgeon: Sheila Hacker, MD;  Location: [facility];  Service: Orthopedics;  Laterality: Right;       Social History:     History     Social History   . Marital Status: Widowed     Spouse Name: N/A     Number of Children: N/A   . Years of Education: N/A     Social History Main Topics   . Smoking status: Former  Smoker -- 1.0 packs/day for 45 years     Quit date: 08/20/1998   . Smokeless tobacco: Never Used   . Alcohol Use: Yes      Comment: occassionally   . Drug Use: No   . Sexually Active:      Other Topics Concern   . Not on file     Social History Narrative   . No narrative on file     No recent travel  No known tick exposure/insect exposure      Family History:   No family history on file.  No ill contacts in the family  Review of Systems:   No Fevers, Chills, Rigors  No HA  No Neck Stiffness  No sore throat, dysphagia, or odynophagia  No cough, Chest Pain, or shortness of breath  No N/V/D  No abdominal pain  No dysuria, frequency, hesitancy, hematuria   No flank pain/No CVAT  No itching or rash  No focal neurologic complaints  Inc swelling and bloody drainage from the right knee    Physical Exam:     Filed Vitals:    09/05/13 1855 09/05/13 2046 09/06/13 0057 09/06/13 0613   BP: 143/62 147/65 141/63 152/67   Pulse: 70 72 65 74   Temp:  96.3 F (35.7 C) 97.2 F (36.2 C) 98.6 F (37 C)   TempSrc:       Resp: 18 18 18 18    Height:       Weight:       SpO2:  98% 95% 98%       Temp (24hrs), Avg:97.5 F (36.4 C), Min:96.3 F (35.7 C), Max:98.6 F (37 C)      peripheral iv    Active PICC Line / CVC Line / PIV Line / Drain / Airway / Intraosseous Line / Epidural Line / ART Line /  Line / Wound / Pressure Ulcer / NG/OG Tube     Name   Placement date   Placement time   Site   Days    OnQ Pump (Incisional) 08/25/13 knee Right  08/25/13   0939   knee   12    Peripheral IV 09/05/13 Right Hand  09/05/13   1214   Hand   1    Peripheral IV Left Hand        Hand   --    Closed/Suction Drain Right Knee 10 Fr.  09/05/13   1401   Knee   1    Epidural Catheter 09/05/13  09/05/13   1231     1    Incision Site 09/05/13 Knee Right  09/05/13   1437     1          Intake and Output Summary (Last 24 hours) at Date Time    Intake/Output Summary (Last 24 hours) at 09/06/13 1445  Last data filed at 09/06/13 0600   Gross per 24 hour   Intake     200 ml   Output    480 ml   Net   -280 ml     Obese   Well-developed, well-nourished in no apparent distress  Sclera anicteric, conjunctivae clear  Oral pharynx s erythema, exudate, or other significant lesions  No Thrush  Neck supple, s lymphadenopathy  Lungs Clear  Heart Regular rate and rhythm s murmurs, rubs, or gallops  Abdomen Soft, non tender, non distended, normal bowel sounds  Back No Costovertebral angle tenderness, no point tenderness along the spine  Extremities No Cyanosis, no clubbing, +edema right  Lower extremity   Dressing cdi  Hemovac drain - bloody  Palpable pedal pulses  Neurologic exam is grossly non-focal, pt extremely  Sleepy, but arousable.  Skin without rash or other significant lesions      Labs Reviewed:     Results     Procedure Component Value Units Date/Time    Wound culture & gram stain [[id number]] Collected:09/05/13 1445    Specimen Information:Wound / Tissue Updated:09/05/13 2315    Narrative:    ORDER#: [id number]                                    ORDERED BY: [name], NITIN  SOURCE: Tissue right knee #1                         COLLECTED:  09/05/13 14:45  ANTIBIOTICS AT COLL.:                                RECEIVED :  09/05/13 18:34  Stain, Gram                                FINAL       09/05/13 23:15  09/05/13   Few WBCs             No organisms seen             No Squamous epithelial cells seen  Culture Wound                              PENDING  Wound culture & gram stain [[id number]] Collected:09/05/13 1445    Specimen Information:Wound / Synovial Fluid Updated:09/05/13 2209    Narrative:    ORDER#: [id number]                                    ORDERED BY: [name], NITIN  SOURCE: Synovial Fluid right knee synovial fluid     COLLECTED:  09/05/13 14:45  ANTIBIOTICS AT COLL.:                                RECEIVED :  09/05/13 18:34  ORDER ENTRY COMMENTS:  Eswab  Stain, Gram                                FINAL       09/05/13 22:09  09/05/13   Moderate WBCs             No organisms  seen  Culture Wound                              PENDING      Wound culture & gram stain [[id number]] Collected:09/05/13 1445    Specimen Information:Wound / Tissue Updated:09/05/13 2202    Narrative:    ORDER#: [id number]                                    ORDERED BY: [name], NITIN  SOURCE: Tissue right knee #3                         COLLECTED:  09/05/13 14:45  ANTIBIOTICS AT COLL.:                                RECEIVED :  09/05/13 18:34  Stain, Gram                                FINAL       09/05/13 22:02  09/05/13   Few WBCs             No organisms seen             No Squamous epithelial cells seen  Culture Wound                              PENDING      Wound culture & gram stain [[id number]] Collected:09/05/13 1445    Specimen Information:Wound / Tissue Updated:09/05/13 2200    Narrative:    ORDER#: [id number]                                    ORDERED BY: [name], NITIN  SOURCE: Tissue Right knee #2                         COLLECTED:  09/05/13 14:45  ANTIBIOTICS AT COLL.:  RECEIVED :  09/05/13 18:34  Stain, Gram                                FINAL       09/05/13 22:00  09/05/13   Few WBCs             No organisms seen             No Squamous epithelial cells seen  Culture Wound                              PENDING      Anaerobic culture [[id number]] Collected:09/05/13 1445    Specimen Information:Other / Tissue Updated:09/05/13 1835    Anaerobic culture [[id number]] Collected:09/05/13 1445    Specimen Information:Other / Tissue Updated:09/05/13 1835    Anaerobic culture [[id number]] Collected:09/05/13 1445    Specimen Information:Other / Tissue Updated:09/05/13 1835    Anaerobic culture [[id number]] Collected:09/05/13 1445    Specimen Information:Other / Synovial Fluid Updated:09/05/13 1835             Lab 09/05/13 1203   WBC 11.73*   HGB 9.1*   HCT 28.8*   PLT 249        Lab 09/05/13 1203   NA 143   K 3.9   CL 104   CO2 27   BUN 13   CREAT 0.7   CA 9.1   ALB --   PROT --   BILITOTAL  --   ALKPHOS --   ALT --   AST --   GLU 101*       Lab 09/05/13 1203   INR 1.1   APTT --         Rads:     Radiology Results (24 Hour)     Procedure Component Value Units Date/Time    X-ray knee right AP and lateral [[id number]] Collected:09/05/13 1514    Order Status:Completed  Updated:09/05/13 1519    Narrative:    RIGHT KNEE: AP and lateral views     CLINICAL STATEMENT: Arthroplasty    COMPARISON: The study is compared to a prior performed on 08/25/2013.    FINDINGS: The patient is status post right knee arthroplasty. The tibial  and femoral prosthetic components appear to be in appropriate anatomic  alignment and well seated. Post surgical changes are noted of the  posterior patella. A drain is identified overlying the knee joint. There  is no fracture or dislocation. Air and fluid is present within the knee  joint. The periarticular soft tissues are heterogeneous.       Impression:      Patient status post right knee arthroplasty.    Fonnie Mu, MD   09/05/2013 3:14 PM           Signed by: Ray Church, MD

## 2013-09-06 NOTE — Progress Notes (Addendum)
PROGRESS NOTE    Date Time: 09/06/2013 7:54 AM  Patient Name: [name], [name]      Assessment:    Right TKA Irrigation and Debridement and Poly Exchange 09/05/13    Plan:   Continue PT - WBAT in Extension with brace on, No bending knee  Continue anticoagulation - ASA 325 bid  Cultures Pending  Continue Vancomycin and Rocephin  ID consult pending  Will hold her Bystolic due to periods of hypotension, will reevaluate post op day 2   D/c planning    Subjective:   Pain controlled, tolerating diet, participating in PT    Medications:     Current Facility-Administered Medications   Medication Dose Route Frequency   . [EXPIRED] acetaminophen  650 mg Oral Q4H   . amLODIPine  5 mg Oral Daily   . aspirin EC  325 mg Oral Daily   . aspirin EC  325 mg Oral Daily   . cefTRIAXone  1 g Intravenous Q24H SCH   . celecoxib  200 mg Oral Daily   . [COMPLETED] celecoxib  200 mg Oral Once   . cloNIDine  0.1 mg Oral Daily   . docusate sodium  100 mg Oral Q12H SCH   . gabapentin  300 mg Oral BID   . hydrochlorothiazide  25 mg Oral Daily   . ketorolac  15 mg Intravenous Once   . ketorolac  30 mg Intravenous Once   . povidone-iodine   Both Eyes Once   . pregabalin  75 mg Oral BID   . tiotropium  18 mcg Inhalation QAM   . traMADol  50 mg Oral Q6H SCH   . [COMPLETED] tranexamic acid (CYKLOPRON) IVPB  1,000 mg Intravenous Once   . [COMPLETED] tranexamic acid (CYKLOPRON) IVPB  1,000 mg Intravenous Once   . vancomycin  1,000 mg Intravenous Q12H SCH   . vancomycin  1,000 mg Intravenous Q12H   . [DISCONTINUED] docusate sodium  100 mg Oral BID   . [DISCONTINUED] vancomycin (VANCOCIN) 1 g IVPB  1,000 mg Intravenous Once   . [DISCONTINUED] vancomycin  1,500 mg Intravenous 90 Min Pre-Op       Physical Exam:     Filed Vitals:    09/06/13 0613   BP: 152/67   Pulse: 74   Temp: 98.6 F (37 C)   Resp: 18   SpO2: 98%       Intake and Output Summary (Last 24 hours) at Date Time    Intake/Output Summary (Last 24 hours) at 09/06/13 0754  Last data filed at  09/06/13 0600   Gross per 24 hour   Intake   1200 ml   Output    555 ml   Net    645 ml       General appearance - alert, well appearing, and in no distress  Musculoskeletal - Right knee  Dresing C/D/I, drain serosanguinous will D/C tomorrow  Fires quad/TA/EHL/GSC  SILT SP/DP/TN  WWP distally  Calves soft, nontender bilateral    Physical Therapy:   Pain Score: 5-moderate pain (09/06/13 0700)              Labs:     Results     Procedure Component Value Units Date/Time    Wound culture & gram stain [[id number]] Collected:09/05/13 1445    Specimen Information:Wound / Tissue Updated:09/05/13 2315    Narrative:    ORDER#: [id number]  ORDERED BY: GOYAL, NITIN  SOURCE: Tissue right knee #1                         COLLECTED:  09/05/13 14:45  ANTIBIOTICS AT COLL.:                                RECEIVED :  09/05/13 18:34  Stain, Gram                                FINAL       09/05/13 23:15  09/05/13   Few WBCs             No organisms seen             No Squamous epithelial cells seen  Culture Wound                              PENDING      Wound culture & gram stain [[id number]] Collected:09/05/13 1445    Specimen Information:Wound / Synovial Fluid Updated:09/05/13 2209    Narrative:    ORDER#: [id number]                                    ORDERED BY: Ahmed Prima, NITIN  SOURCE: Synovial Fluid right knee synovial fluid     COLLECTED:  09/05/13 14:45  ANTIBIOTICS AT COLL.:                                RECEIVED :  09/05/13 18:34  ORDER ENTRY COMMENTS:  Eswab  Stain, Gram                                FINAL       09/05/13 22:09  09/05/13   Moderate WBCs             No organisms seen  Culture Wound                              PENDING      Wound culture & gram stain [[id number]] Collected:09/05/13 1445    Specimen Information:Wound / Tissue Updated:09/05/13 2202    Narrative:    ORDER#: [id number]                                    ORDERED BY: Ahmed Prima, NITIN  SOURCE: Tissue right knee #3                          COLLECTED:  09/05/13 14:45  ANTIBIOTICS AT COLL.:                                RECEIVED :  09/05/13 18:34  Stain, Gram  FINAL       09/05/13 22:02  09/05/13   Few WBCs             No organisms seen             No Squamous epithelial cells seen  Culture Wound                              PENDING      Wound culture & gram stain [[id number]] Collected:09/05/13 1445    Specimen Information:Wound / Tissue Updated:09/05/13 2200    Narrative:    ORDER#: [id number]                                    ORDERED BY: GOYAL, NITIN  SOURCE: Tissue Right knee #2                         COLLECTED:  09/05/13 14:45  ANTIBIOTICS AT COLL.:                                RECEIVED :  09/05/13 18:34  Stain, Gram                                FINAL       09/05/13 22:00  09/05/13   Few WBCs             No organisms seen             No Squamous epithelial cells seen  Culture Wound                              PENDING      Anaerobic culture [[id number]] Collected:09/05/13 1445    Specimen Information:Other / Tissue Updated:09/05/13 1835    Anaerobic culture [[id number]] Collected:09/05/13 1445    Specimen Information:Other / Tissue Updated:09/05/13 1835    Anaerobic culture [[id number]] Collected:09/05/13 1445    Specimen Information:Other / Tissue Updated:09/05/13 1835    Anaerobic culture [[id number]] Collected:09/05/13 1445    Specimen Information:Other / Synovial Fluid Updated:09/05/13 1835    Type and Screen [[id number]] Collected:09/05/13 1203    Specimen Information:Blood Updated:09/05/13 1252     ABO Rh B POS      AB Screen Gel NEG     Sedimentation rate (ESR) [[id number]]  (Abnormal) Collected:09/05/13 1203    Specimen Information:Blood Updated:09/05/13 1242     Sed Rate 70 (H)     APTT [[id number]] Collected:09/05/13 1203     PTT 31 Updated:09/05/13 1240    Protime-INR [[id number]] Collected:09/05/13 1203    Specimen Information:Blood Updated:09/05/13 1240     PT 14.0      PT INR 1.1      PT Anticoag.  Given Within 48 hrs. None     Basic metabolic panel [[id number]]  (Abnormal) Collected:09/05/13 1203    Specimen Information:Blood Updated:09/05/13 1229     Glucose 101 (H) mg/dL      BUN 13 mg/dL      Creatinine 0.7 mg/dL      CALCIUM 9.1 mg/dL      Sodium 762 mEq/[name]      Potassium 3.9 mEq/[name]      Chloride 104  mEq/[name]      CO2 27 mEq/[name]     GFR [[id number]] Collected:09/05/13 1203     EGFR >60.0 Updated:09/05/13 1229    C-reactive protein [[id number]]  (Abnormal) Collected:09/05/13 1203    Specimen Information:Blood Updated:09/05/13 1229     C-Reactive Protein 9.3 (H) mg/dL     CBC and differential [[id number]]  (Abnormal) Collected:09/05/13 1203    Specimen Information:Blood / Blood Updated:09/05/13 1217     WBC 11.73 (H)      RBC 3.37 ([name])      Hgb 9.1 ([name]) g/dL      Hematocrit 29.5 ([name]) %      MCV 85.5 fL      MCH 27.0 ([name]) pg      MCHC 31.6 ([name]) g/dL      RDW 15 %      Platelets 249      MPV 10.3 fL      Neutrophils 71 %      Lymphocytes Automated 17 %      Monocytes 9 %      Eosinophils Automated 3 %      Basophils Automated 0 %      Immature Granulocyte 0 %      Nucleated RBC 0      Neutrophils Absolute 8.36 (H)      Abs Lymph Automated 1.97      Abs Mono Automated 1.03      Abs Eos Automated 0.35      Absolute Baso Automated 0.02      Absolute Immature Granulocyte 0.03           Recent CBC Recent Labs   Basename 09/05/13 1203    RBC 3.37*    HGB 9.1*    HCT 28.8*    LABPLAT --     Recent BMP Recent Labs   Basename 09/05/13 1203    GLU 101*    BUN 13    CREAT 0.7    CA 9.1    NA 143    K 3.9    CL 104    CO2 27     Recent PT/INR Recent Labs   Basename 09/05/13 1203    INR 1.1       Rads:   Radiological Procedure reviewed.    Signed by: Garey Ham

## 2013-09-06 NOTE — Consults (Signed)
Pharmacy Note: Vancomycin Dosing Consult    77 yo F s/p right TKA, now with pain and drainage from knee, admitted for surgical irrigation and debridement (done 09/05/13). Cultures done from right knee, results are pending.     Patient weight: 112 kg (246 lb 14.6 oz)   SCr:  Lab 09/05/13 1203   CREAT 0.7     Est. CrCl: CREATININE: 0.7 (09/05/13 1203)  Estimated creatinine clearance - Cockcroft-Gault CrCl: 83.5 mL/min   WBC:  Lab 09/05/13 1203   WBC 11.73*     Tmax in last 24h: 98.6    Cultures (date/source):         Other pertinent findings: Pt rec'd 1500 mg IV x1 pre-op on 11/28 @ 1350. Rec'd 1000 mg this AM at 1011 prior to dose adjustment.    Vancomycin Indication: PJI  Vancomycin Target Trough Level: 15-20 mg/L   Note: 15-20 mg/L or 10-15 mg/L for UTI    Recommendations:   Initial post-op regimen was 1000 mg IV q12h. Recommend dose increase due to pt's wt. However, will do conservative dose increase to 1250 mg because pt's wt may be skewing CrCL calculation and overestimating her true kidney function, but will keep interval at q12h for now. Will check trough at steady state to determine appropriateness of current regimen.    Maintenance regimen = 1250 mg Q 12 hr  Vancomycin level:  Trough ordered prior to 5th dose on 12/1 @ 1930.    Rph name: Haskel Schroeder, PharmD  Rph phone: [phone]

## 2013-09-06 NOTE — Addendum Note (Signed)
Addendum  created 09/06/13 1746 by Ellis Savage, MD    Modules edited:Anesthesia Events

## 2013-09-07 LAB — BASIC METABOLIC PANEL
BUN: 19 mg/dL (ref 7–21)
CO2: 25 mEq/L (ref 22–29)
Calcium: 8.3 mg/dL (ref 7.9–10.6)
Chloride: 106 mEq/L (ref 98–107)
Creatinine: 0.8 mg/dL (ref 0.6–1.0)
Glucose: 126 mg/dL — ABNORMAL HIGH (ref 70–100)
Potassium: 3.5 mEq/L (ref 3.5–5.1)
Sodium: 141 mEq/L (ref 136–145)

## 2013-09-07 LAB — HEMOGLOBIN AND HEMATOCRIT, BLOOD
Hematocrit: 26.8 % — ABNORMAL LOW (ref 37.0–47.0)
Hgb: 8.2 g/dL — ABNORMAL LOW (ref 12.0–16.0)

## 2013-09-07 LAB — GFR: EGFR: >60

## 2013-09-07 MED ORDER — NEBIVOLOL HCL 5 MG PO TABS
5.0000 mg | ORAL_TABLET | Freq: Every evening | ORAL | Status: DC
Start: 2013-09-07 — End: 2013-09-23
  Administered 2013-09-07 – 2013-09-21 (×15): 5 mg via ORAL

## 2013-09-07 NOTE — Progress Notes (Signed)
Pt was did vomiting x 3 today and Zofran i/v  Given as ordered and i checked her abdomen and it is not distended and have  good bowel sound  and pt said she not feel like she is constipated .we will cont to monitor.

## 2013-09-07 NOTE — Discharge Summary (Signed)
Physician Discharge Summary   Patient ID:  [name]  [id number]  77 y.o.  [birth year]    Admit date: 09/05/2013    Discharge date: 10/03/13    Admitting Physician: Lane Hacker, MD     Admission Diagnoses: right total knee  infection    Discharge Diagnoses: right total knee  Infection, gram negative rods    Operative Procedures: right total knee arthroplasty irrigation and debridement and poly exchange    Indication for Admission: The patient has a history of  knee osteoarthritis that has become progressively worse, and the patient has elected to proceed with total knee arthroplasty 2 weeks ago.  She had a draining wound and was taken to the OR for irrigation and debridement and polyethylene exchange.    Hospital Course: The patient was admitted to the hospital on 09/05/2013 and underwent total knee arthroplasty I&D and poly exchange. The procedure was performed without complication. Please see the operative report for details thereof. Post-operatively, the patient was transferred to the orthopaedic floor and received standard prophylactic perioperative antibiotics including vancomycin and rocephin.  DVT prophylaxis including TED hose, foot pumps, early ambulation, and ASA was started on POD #0.  The patient was neurovascularly intact on the operative leg, and with dressing change, the incision was found to be clean, dry, and intact.    She continued to drain and had a resection and spacer and ultimately a revision exchange spacer and a gastroc flap by plastic surgery.    The patient progressed well throughout the hospitalization and was deemed stable for discharge on POD #25 .      Disposition: stable    Discharge Medications:      Medication List       As of 09/07/2013  8:13 AM      ASK your doctor about these medications           amLODIPine 10 MG tablet    Commonly known as: NORVASC        aspirin EC 325 MG EC tablet    Take 1 tablet (325 mg total) by mouth daily.        celecoxib 200 MG capsule    Commonly known  as: CeleBREX    Take 1 capsule (200 mg total) by mouth daily.        cloNIDine 0.1 MG tablet    Commonly known as: CATAPRES        docusate sodium 100 MG capsule    Commonly known as: COLACE    Take 1 capsule (100 mg total) by mouth 2 (two) times daily.        gabapentin 300 MG capsule    Commonly known as: NEURONTIN        hydrochlorothiazide 25 MG tablet    Commonly known as: HYDRODIURIL        HYDROcodone-acetaminophen 5-325 MG per tablet    Commonly known as: NORCO    Take 1-2 tablets by mouth every 4 (four) hours as needed for Pain.        nebivolol 5 MG tablet    Commonly known as: BYSTOLIC        ondansetron 4 MG disintegrating tablet    Commonly known as: ZOFRAN-ODT    Take 1 tablet (4 mg total) by mouth every 8 (eight) hours as needed for Nausea.        tiotropium 18 MCG inhalation capsule    Commonly known as: SPIRIVA        traMADol 50 MG  tablet    Commonly known as: ULTRAM    Take 1 tablet (50 mg total) by mouth every 6 (six) hours as needed for Pain.             Patient Instructions:     The patient will notify the office for any increased bleeding, redness, drainage, swelling, worsening pain, calf pain or swelling, or other concerning symptoms.  They have been instructed to report to the emergency room immediately for any chest pain or shortness of breath.     Physical therapy has been ordered.    Activity:The patient is 10% WB on the operative extremity with the knee in extension.  No bending the knee.    Wound Care: The patient has a dressing that should be changed.  They can get the incision wet in the shower, and will pat dry afterwards and cover with new dressing  .  Follow-up with Lane Hacker, MD at scheduled appointment 2 weeks post-op.    Signed:  Garey Ham  09/07/2013  8:13 AM

## 2013-09-07 NOTE — Plan of Care (Signed)
Problem: Pain  Goal: Patient's pain/discomfort is manageable  Outcome: Progressing  Pt has been complaining of pain. Medicating with Norco with good effects. Pt has been able to get OOB to bedside commode. Will continue to monitor.

## 2013-09-07 NOTE — Progress Notes (Addendum)
PROGRESS NOTE    Date Time: 09/07/2013 8:15 AM  Patient Name: [name], [name]      Assessment:    Right TKA Irrigation and Debridement and Poly Exchange 09/05/13    Plan:   Continue PT - WBAT in Extension with brace on, No bending knee  Continue anticoagulation - ASA 325 bid  Cultures Pending, gram negative rods growing in 4/4 cultures  Continue Vancomycin and ceftazidime (fortaz)  ID consulted and final recs pending cultures  D/c planning    Subjective:   Pain controlled, tolerating diet, participating in PT    Medications:     Current Facility-Administered Medications   Medication Dose Route Frequency   . amLODIPine  5 mg Oral Daily   . aspirin EC  325 mg Oral Daily   . ceftAZIDime  2 g Intravenous Q8H SCH   . celecoxib  200 mg Oral Daily   . cloNIDine  0.1 mg Oral Daily   . docusate sodium  100 mg Oral Q12H SCH   . gabapentin  300 mg Oral BID   . hydrochlorothiazide  25 mg Oral Daily   . [COMPLETED] ketorolac  15 mg Intravenous Once   . povidone-iodine   Both Eyes Once   . tiotropium  18 mcg Inhalation QAM   . traMADol  50 mg Oral Q6H SCH   . vancomycin  1,250 mg Intravenous Q12H   . [DISCONTINUED] aspirin EC  325 mg Oral Daily   . [DISCONTINUED] cefTRIAXone  1 g Intravenous Q24H SCH   . [DISCONTINUED] ketorolac  30 mg Intravenous Once   . [DISCONTINUED] pregabalin  75 mg Oral BID   . [DISCONTINUED] vancomycin  1,000 mg Intravenous Q12H SCH   . [DISCONTINUED] vancomycin  1,000 mg Intravenous Q12H       Physical Exam:     Filed Vitals:    09/07/13 0512   BP: 128/57   Pulse: 80   Temp: 99 F (37.2 C)   Resp: 18   SpO2: 96%       Intake and Output Summary (Last 24 hours) at Date Time  No intake or output data in the 24 hours ending 09/07/13 0815    General appearance - alert, well appearing, and in no distress  Musculoskeletal - Right knee  Dresing , drain serosanguinous d/c 09/07/13, small area of drainage inferiorly  Fires quad/TA/EHL/GSC  SILT SP/DP/TN  WWP distally  Calves soft, nontender bilateral    Physical  Therapy:   Pain Score: 4-moderate pain (09/07/13 0512)  Ambulation Distance (Feet): 10 Feet (09/06/13 1610)  Right Lower Extremity ROM:  (locked in extension) (09/06/13 9604)  Left Lower Extremity ROM: within functional limits (09/06/13 0822)              Labs:     Results     Procedure Component Value Units Date/Time    Basic metabolic panel [[id number]]  (Abnormal) Collected:09/07/13 0421    Specimen Information:Blood Updated:09/07/13 0515     Glucose 126 (H) mg/dL      BUN 19 mg/dL      Creatinine 0.8 mg/dL      CALCIUM 8.3 mg/dL      Sodium 478 mEq/L      Potassium 3.5 mEq/L      Chloride 106 mEq/L      CO2 25 mEq/L     Narrative:    [facility] x2    GFR [[id number]] Collected:09/07/13 0421     EGFR >60.0 Updated:09/07/13 0515    Narrative:  [facility] x2    Hemoglobin and hematocrit, blood [[id number]]  (Abnormal) Collected:09/07/13 0421    Specimen Information:Blood Updated:09/07/13 0450     Hgb 8.2 (L) g/dL      Hematocrit 96.0 (L) %     Narrative:    [facility] x2    Wound culture & gram stain [[id number]] Collected:09/05/13 1445    Specimen Information:Wound / Synovial Fluid Updated:09/06/13 1620    Narrative:    Critical  gnr in syn fluid   result called to [id number]    . Readback confirmed,  by [id number] on 09/06/2013 at 16:19  ORDER#: [id number]                                    ORDERED BY: GOYAL, NITIN  SOURCE: Synovial Fluid right knee synovial fluid     COLLECTED:  09/05/13 14:45  ANTIBIOTICS AT COLL.:                                RECEIVED :  09/05/13 18:34  ORDER ENTRY COMMENTS:  Eswab  Critical  gnr in syn fluid   result called to [id number]    . Readback confirmed, by [id number] on 09/06/2013 at 16:19  Stain, Gram                                FINAL       09/05/13 22:09  09/05/13   Moderate WBCs             No organisms seen  Culture Wound                              PRELIM      09/06/13 16:20   +  09/06/13   Light growth of Gram negative rod               Identification and susceptibility to follow        Wound culture &  gram stain [[id number]] Collected:09/05/13 1445    Specimen Information:Wound / Tissue Updated:09/06/13 1616    Narrative:    ORDER#: [id number]                                    ORDERED BY: Ahmed Prima, NITIN  SOURCE: Tissue right knee #1                         COLLECTED:  09/05/13 14:45  ANTIBIOTICS AT COLL.:                                RECEIVED :  09/05/13 18:34  Stain, Gram                                FINAL       09/05/13 23:15  09/05/13   Few WBCs             No organisms seen             No Squamous epithelial cells seen  Culture Wound  PRELIM      09/06/13 16:16   +  09/06/13   Light growth of Gram negative rod               Identification and susceptibility to follow        Wound culture & gram stain [[id number]] Collected:09/05/13 1445    Specimen Information:Wound / Tissue Updated:09/06/13 1615    Narrative:    ORDER#: [id number]                                    ORDERED BY: Ahmed Prima, NITIN  SOURCE: Tissue right knee #3                         COLLECTED:  09/05/13 14:45  ANTIBIOTICS AT COLL.:                                RECEIVED :  09/05/13 18:34  Stain, Gram                                FINAL       09/05/13 22:02  09/05/13   Few WBCs             No organisms seen             No Squamous epithelial cells seen  Culture Wound                              PRELIM      09/06/13 16:15   +  09/06/13   Light growth of Gram negative rod               Identification and susceptibility to follow        Wound culture & gram stain [[id number]] Collected:09/05/13 1445    Specimen Information:Wound / Tissue Updated:09/06/13 1610    Narrative:    ORDER#: [id number]                                    ORDERED BY: Ahmed Prima, NITIN  SOURCE: Tissue Right knee #2                         COLLECTED:  09/05/13 14:45  ANTIBIOTICS AT COLL.:                                RECEIVED :  09/05/13 18:34  Stain, Gram                                FINAL       09/05/13 22:00  09/05/13   Few WBCs             No  organisms seen             No Squamous epithelial cells seen  Culture Wound                              PRELIM  09/06/13 16:10   +  09/06/13   Light growth of Gram negative rod               Identification and susceptibility to follow              Recent CBC Recent Labs   Basename 09/07/13 0421    RBC --    HGB 8.2*    HCT 26.8*    LABPLAT --     Recent BMP Recent Labs   Basename 09/07/13 0421    GLU 126*    BUN 19    CREAT 0.8    CA 8.3    NA 141    K 3.5    CL 106    CO2 25     Recent PT/INR No results found for this basename: PTI,INR,COUM,COUMP in the last 24 hours    Rads:   Radiological Procedure reviewed.    Signed by: Garey Ham

## 2013-09-07 NOTE — PT Progress Note (Signed)
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      Patient's medical condition is appropriate for Physical Therapy  intervention at this time.  Patient cleared by nurse to participate in PT session, nursing reports.    Subjective: "I can walk."  Patient is agreeable to participation in the therapy session.  Patient Goal:  (To get better)  Pain Assessment  Pain Assessment: Numeric Scale (0-10)  Pain Score: 4-moderate pain  Pain Location: Knee  Pain Orientation: Right  Pain Descriptors: Aching  Pain Frequency: Intermittent  Effect of Pain on Daily Activities: moderate  Patient's Stated Comfort Functional Goal: 0-No pain  Pain Intervention(s): Repositioned  Functional Limits  Pain Level Best:  (0)  Pain Level Worst: 10       Objective:  Patient is out of bed, ambulating seen 2 Days Post-Op with knee brace locked in extension on R LE.    Gross ROM  Right Upper Extremity ROM: within functional limits  Left Upper Extremity ROM: within functional limits  Right Lower Extremity ROM:  (knee brace locked in extension)  Left Lower Extremity ROM: within functional limits                                  Sensation is intact       Cognition  Arousal/Alertness: Appropriate responses to stimuli  Attention Span: Appears intact  Memory:  Appears intact  Following Commands: Follows all commands and directions without difficulty  Safety Awareness: independent  Insights: Fully aware of deficits  Problem Solving: Able to problem solve independently      Functional Mobility  Supine to Sit: Unable to assess (Comment) (Standing at sink when PT arrived)  Scooting: Unable to assess (Comment)  Sit to Supine: Unable to assess (Comment)  Sit to Stand: Min assist  Stand to Sit: Min assist    Transfers  Bed to Chair: Min assist to right  Stand Pivot Transfers: Minimal assistance    Locomotion  Ambulation: minimal assistance;with front-wheeled walker  Ambulation Distance (Feet):  (100' x 2)  Pattern: R decreased stance time  Weight Bearing Status: Total  Weight Bearing Percent:  (100%)    Therapeutic Exercise  Quad Sets: 15  Glute Sets: 15  Knee AROM : no knee flexion  Ankle Pumps: 25       Self Care and Home Management:  Pt did not perform self care activities at this time.    Educated the patient to role of physical therapy, plan of care, goals  of therapy and safety with mobility and ADLs.    Patient is seated in wheelchair in room with  her daughter present with all needs provided and call bell within reach. RN notified of session outcome.    Assessment:   Assessment  Assessment: Decreased LE strength;Decreased endurance/activity tolerance;Decreased functional mobility;Decreased balance;Gait impairment  Prognosis: Good;With continued PT status post acute discharge.           Goals per Eval/Re-eval:     Goals  Goal Formulation: With patient  Time for Goal Acheivement: 7 visits  Goals: Select goal  Pt Will Go Supine To Sit: With stand by assist  Pt Will Perform Sit to Stand: With stand by assist  Pt Will Transfer to Toilet: Independently  Pt Will Transfer to Tub/Shower: With minimal assist  PT Will Demonstrate Car Transfer Technique: With minimal assist  Pt Will Ambulate: 101-150 feet;With stand by assist  Pt Will Go Up / Down Stairs: 3-5 stairs;With stand by  assist  Pt Will Perform Home Exer Program: Independently  Pt Will Perform LE Dressing w/Device: With min assist          Plan:  Recommendation  Discharge Recommendation: Home with home health PT  DME Recommended for Discharge:  (None-has equipment at home)  PT - Next Visit Recommendation: 09/08/13  PT Frequency: 7x/wk    Continue plan of care.      Signature: Lorenz Coaster, PT 09/07/2013 10:00 AM

## 2013-09-08 LAB — BASIC METABOLIC PANEL
BUN: 17 mg/dL (ref 7–21)
CO2: 25 mEq/L (ref 22–29)
Calcium: 8.2 mg/dL (ref 7.9–10.6)
Chloride: 105 mEq/L (ref 98–107)
Creatinine: 0.8 mg/dL (ref 0.6–1.0)
Glucose: 119 mg/dL — ABNORMAL HIGH (ref 70–100)
Potassium: 3.4 mEq/L — ABNORMAL LOW (ref 3.5–5.1)
Sodium: 141 mEq/L (ref 136–145)

## 2013-09-08 LAB — GFR: EGFR: >60

## 2013-09-08 LAB — HEMOGLOBIN AND HEMATOCRIT, BLOOD
Hematocrit: 26.7 % — ABNORMAL LOW (ref 37.0–47.0)
Hgb: 8.2 g/dL — ABNORMAL LOW (ref 12.0–16.0)

## 2013-09-08 MED ORDER — CIPROFLOXACIN HCL 250 MG PO TABS
750.0000 mg | ORAL_TABLET | Freq: Two times a day (BID) | ORAL | Status: DC
Start: 2013-09-08 — End: 2013-09-12
  Administered 2013-09-08 – 2013-09-12 (×8): 750 mg via ORAL
  Filled 2013-09-08 (×9): qty 3

## 2013-09-08 MED ORDER — POTASSIUM CHLORIDE CRYS ER 20 MEQ PO TBCR
40.0000 meq | EXTENDED_RELEASE_TABLET | Freq: Once | ORAL | Status: AC
Start: 2013-09-08 — End: 2013-09-08
  Administered 2013-09-08: 40 meq via ORAL
  Filled 2013-09-08: qty 2

## 2013-09-08 NOTE — Plan of Care (Signed)
Problem: Day 2 Post-op- Knee Surgery  Goal: Hemodynamic Stability  Outcome: Completed Date Met:  09/08/13  Pt. Is alert and oriented x3, VSS, Oxygen SAT>95% on RA, Denies CP/SOB/N/V, ambulatory, Voiding. Surgical dressing C/D/I.   Goal: Pain at adequate level as identified by patient  Outcome: Completed Date Met:  09/08/13  Pain controlled with Tramadol, repositioned and legs elevated on Pillow. IROM brace to extention.  Goal: Free from Infection  Outcome: Progressing  On continuous Vancomycin and Fortaz therapy as ordered, s/p I & D of R knee, no fever noted, do drainage to surgical site.   Goal: Patient will maintain normal GI status (Post Op Day 2- Knee Surgery)  Outcome: Completed Date Met:  09/08/13   No c/o nausea noted, ABD soft ND/NT, BS +X4QUADS, +GAS, Had BM on 09/06/13, on stool softners, continue to monitor.  Goal: Mobility/activity is maintained at optimum level for patient  Outcome: Completed Date Met:  09/08/13  Ambulatory with walker and assistance.

## 2013-09-08 NOTE — Plan of Care (Signed)
Problem: Pain  Goal: Patient's pain/discomfort is manageable  Outcome: Progressing  Pain well-controlled with current pain regimen,ice therapy as needed.Continue with plan of care.    Problem: Day 3 Post-op- Knee Surgery  Goal: Mobility/activity is maintained at optimum level for patient  Outcome: Progressing  Pt with IROM brace in place on RLE,OOB with walker to the bathroom with assistance.Tolerating activity well.Safety maintained.

## 2013-09-08 NOTE — Progress Notes (Signed)
Infectious Diseases  Progress Note     Impression:   77 yof a/w RKPJI w/ pansensitive Proteus. Afeb, normal WBC. Baseline CRP 9.3 and ESR 70. Creat 0.8  Doing well post op.  Strategy to attempt to suppress this infection was d/w Pt and family for 25 minutes.  B/R/A outlined in detail    Recommendations:   Cipro 750 mg BID  Labs every 2 weeks: CBC w diff, CMP, CRP, ESR  Results of labs faxed to [place] at [phone]  F/U W/ ID in 6 weeks  OK for d/c home from ID perspective    Subjective:   Better    Review of Systems  Patient without nausea, vomiting, diarrhea, rash, cough, shortness of breath, abdominal or chest pain.      Objective:   BP 155/67  Pulse 79  Temp 97.2 F (36.2 C) (Oral)  Resp 18  Ht 1.6 m (5\' 3" )  Wt 112 kg (246 lb 14.6 oz)  BMI 43.75 kg/m2  SpO2 95%    Temp (24hrs), Avg:96.9 F (36.1 C), Min:96.3 F (35.7 C), Max:97.3 F (36.3 C)      General: awake, alert, oriented x 3; no acute distress.  Cardiovascular: regular rate and rhythm, no murmurs, rubs or gallops  Lungs: clear to auscultation bilaterally, without wheezing, rhonchi, or rales  Abdomen: soft, non-tender, non-distended; no palpable masses, no hepatosplenomegaly, normoactive bowel sounds, no rebound or guarding  Extremities: no clubbing, cyanosis, or edema  Other: right leg braced        Labs:    Lab 09/08/13 0454 09/07/13 0421 09/05/13 1203   WBC -- -- 11.73*   HGB 8.2* 8.2* 9.1*   HCT 26.7* 26.8* 28.8*   PLT -- -- 249   NEUTROPCT -- -- --   MONOPCT -- -- --       Lab 09/08/13 0454 09/07/13 0421 09/05/13 1203   NA 141 141 143   K 3.4* 3.5 3.9   CL 105 106 104   CO2 25 25 27    BUN 17 19 13    CREAT 0.8 0.8 0.7   CA 8.2 8.3 9.1   ALB -- -- --   PROT -- -- --   BILITOTAL -- -- --   ALKPHOS -- -- --   ALT -- -- --   AST -- -- --   GLU 119* 126* 101*

## 2013-09-08 NOTE — PT Progress Note (Signed)
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      Patient's medical condition is appropriate for Physical Therapy  intervention at this time.  Patient cleared by nurse to participate in PT session.    Subjective: I had a good night, not really having much pain.  Patient is agreeable to participation in the therapy session.     Pain Assessment  Pain Assessment: Numeric Scale (0-10)  Pain Score: 2-mild pain  Pain Location: Knee  Pain Orientation: Right          Objective:  Patient is in bed seen 3 Days Post-Op with IV, dressings and hinge brace locked in extension.                                       Sensation is intact              Functional Mobility  Supine to Sit: Modified independent (Device) (HOB elevated)  Scooting: Stand by assistance  Sit to Stand: Min assist  Stand to Sit: Stand by assistance    Transfers  Device Used for Functional Transfer: front-wheeled walker    Locomotion  Ambulation: stand by assistance;with front-wheeled walker  Ambulation Distance (Feet): 100 Feet  Pattern: R decreased stance time;decreased step length;decreased cadence  Weight Bearing Status: Total    Therapeutic Exercise  Quad Sets: 25  Glute Sets: 25  Ankle Pumps: 25       Self Care and Home Management:  Toilet transfer technique: Patient performed with stand-by assistance, with adaptive  equipment    Educated the patient to role of physical therapy, plan of care, goals  of therapy and HEP, safety with mobility and ADLs.    Patient is seated in wheelchair with all needs provided and call bell within reach. RN notified of session outcome.    Assessment:   Assessment  Assessment: Decreased LE strength;Decreased LE ROM;Gait impairment;Decreased functional mobility;Decreased endurance/activity tolerance  Prognosis: Good;With continued PT status post acute discharge.     Pt is progressing toward goals, endurance more limited today as evidenced by decreased ambulatory distance this session, pt c/o decreased pain today.        Goals per Eval/Re-eval:     Goals  Goal Formulation: With patient  Time for Goal Acheivement: 7 visits  Goals: Select goal  Pt Will Go Supine To Sit: With stand by assist  Pt Will Perform Sit to Stand: With stand by assist  Pt Will Transfer to Toilet: Independently  Pt Will Transfer to Tub/Shower: With minimal assist  PT Will Demonstrate Car Transfer Technique: With minimal assist  Pt Will Ambulate: 101-150 feet;With stand by assist;Partly met  Pt Will Go Up /  Down Stairs: 3-5 stairs;With stand by assist  Pt Will Perform Home Exer Program: Independently  Pt Will Perform LE Dressing w/Device: With min assist      Plan:  Recommendation  Discharge Recommendation: Home with home health PT  DME Recommended for Discharge:  (pt has all required equipment)  PT - Next Visit Recommendation: 09/09/13  PT Frequency: 7x/wk    Continue plan of care.      Signature: Curt Bears Briseida Gittings, PTA 09/08/2013 8:59 AM

## 2013-09-08 NOTE — Progress Notes (Signed)
PROGRESS NOTE    Date Time: 09/08/2013 6:43 AM  Patient Name: [name], [name]      Assessment:    Right TKA Irrigation and Debridement and Poly Exchange 09/05/13    Plan:   Continue PT - WBAT in Extension with brace on, No bending knee  Continue anticoagulation - ASA 325 bid  Cultures Pending, gram negative rods growing in 4/4 cultures  Continue Vancomycin and ceftazidime (fortaz)  ID consulted and final recs pending cultures  D/c planning    Subjective:   Pain controlled, tolerating diet, participating in PT    Medications:     Current Facility-Administered Medications   Medication Dose Route Frequency   . amLODIPine  5 mg Oral Daily   . aspirin EC  325 mg Oral Daily   . ceftAZIDime  2 g Intravenous Q8H SCH   . celecoxib  200 mg Oral Daily   . cloNIDine  0.1 mg Oral Daily   . docusate sodium  100 mg Oral Q12H SCH   . gabapentin  300 mg Oral BID   . hydrochlorothiazide  25 mg Oral Daily   . nebivolol  5 mg Oral QHS   . povidone-iodine   Both Eyes Once   . tiotropium  18 mcg Inhalation QAM   . traMADol  50 mg Oral Q6H SCH   . vancomycin  1,250 mg Intravenous Q12H       Physical Exam:     Filed Vitals:    09/08/13 0541   BP: 160/78   Pulse:    Temp:    Resp:    SpO2:        Intake and Output Summary (Last 24 hours) at Date Time  No intake or output data in the 24 hours ending 09/08/13 0643    General appearance - alert, well appearing, and in no distress  Musculoskeletal - Right knee  Dresing , drain serosanguinous d/c 09/07/13, small area of drainage inferiorly  Fires quad/TA/EHL/GSC  SILT SP/DP/TN  WWP distally  Calves soft, nontender bilateral    Physical Therapy:   Pain Score: 3-mild pain (09/08/13 0541)  Weight Bearing Percent:  (100%) (09/07/13 0915)  Ambulation Distance (Feet):  (100' x 2) (09/07/13 0915)  Right Lower Extremity ROM:  (knee brace locked in extension) (09/07/13 0915)  Left Lower Extremity ROM: within functional limits (09/07/13 0915)         Knee AROM : no knee flexion (09/07/13 0915)    Labs:      Results     Procedure Component Value Units Date/Time    GFR [[id number]] Collected:09/08/13 0454     EGFR >60.0 Updated:09/08/13 0611    Narrative:    [facility] x2    Basic metabolic panel [[id number]]  (Abnormal) Collected:09/08/13 0454    Specimen Information:Blood Updated:09/08/13 0611     Glucose 119 (H) mg/dL      BUN 17 mg/dL      Creatinine 0.8 mg/dL      CALCIUM 8.2 mg/dL      Sodium 782 mEq/L      Potassium 3.4 (L) mEq/L      Chloride 105 mEq/L      CO2 25 mEq/L     Narrative:    [facility] x2    Hemoglobin and hematocrit, blood [[id number]]  (Abnormal) Collected:09/08/13 0454    Specimen Information:Blood Updated:09/08/13 0547     Hgb 8.2 (L) g/dL      Hematocrit 57.8 (L) %     Narrative:    [facility]  x2    Wound culture & gram stain [[id number]] Collected:09/05/13 1445    Specimen Information:Wound / Tissue Updated:09/07/13 1446    Narrative:    ORDER#: [id number]                                    ORDERED BY: GOYAL, NITIN  SOURCE: Tissue right knee #1                         COLLECTED:  09/05/13 14:45  ANTIBIOTICS AT COLL.:                                RECEIVED :  09/05/13 18:34  Stain, Gram                                FINAL       09/05/13 23:15  09/05/13   Few WBCs             No organisms seen             No Squamous epithelial cells seen  Culture Wound                              FINAL       09/07/13 14:46   +  09/07/13   Light growth of Proteus mirabilis                 Refer to susceptibilities on culture #[id number]        Wound culture & gram stain [[id number]] Collected:09/05/13 1445    Specimen Information:Wound / Tissue Updated:09/07/13 1445    Narrative:    ORDER#: [id number]                                    ORDERED BY: Ahmed Prima, NITIN  SOURCE: Tissue right knee #3                         COLLECTED:  09/05/13 14:45  ANTIBIOTICS AT COLL.:                                RECEIVED :  09/05/13 18:34  Stain, Gram                                FINAL       09/05/13 22:02  09/05/13   Few WBCs             No  organisms seen             No Squamous epithelial cells seen  Culture Wound                              FINAL       09/07/13 14:45   +  09/07/13   Light growth of Proteus mirabilis                 Refer to susceptibilities on  culture #[id number]        Wound culture & gram stain [[id number]] Collected:09/05/13 1445    Specimen Information:Wound / Synovial Fluid Updated:09/07/13 1444    Narrative:    Critical  gnr in syn fluid   result called to [id number]    . Readback confirmed,  by [id number] on 09/06/2013 at 16:19  ORDER#: [id number]                                    ORDERED BY: GOYAL, NITIN  SOURCE: Synovial Fluid right knee synovial fluid     COLLECTED:  09/05/13 14:45  ANTIBIOTICS AT COLL.:                                RECEIVED :  09/05/13 18:34  ORDER ENTRY COMMENTS:  Eswab  Critical  gnr in syn fluid   result called to [id number]    . Readback confirmed, by [id number] on 09/06/2013 at 16:19  Stain, Gram                                FINAL       09/05/13 22:09  09/05/13   Moderate WBCs             No organisms seen  Culture Wound                              FINAL       09/07/13 14:44   +  09/07/13   Light growth of Proteus mirabilis                 Refer to susceptibilities on culture #[id number]        Wound culture & gram stain [[id number]] Collected:09/05/13 1445    Specimen Information:Wound / Tissue Updated:09/07/13 1443    Narrative:    ORDER#: [id number]                                    ORDERED BY: Ahmed Prima, NITIN  SOURCE: Tissue Right knee #2                         COLLECTED:  09/05/13 14:45  ANTIBIOTICS AT COLL.:                                RECEIVED :  09/05/13 18:34  Stain, Gram                                FINAL       09/05/13 22:00  09/05/13   Few WBCs             No organisms seen             No Squamous epithelial cells seen  Culture Wound                              FINAL       09/07/13 14:43   +  09/07/13  Light growth of Proteus  mirabilis      _____________________________________________________________________________                                  P.mirabilis     ANTIBIOTICS                     MIC  INTRP      _____________________________________________________________________________  Ampicillin                      <=4    S        Aztreonam                       <=2    S        Cefazolin                        8     S        Cefepime                        <=1    S        Cefoxitin                       <=4    S        Ceftazidime                    <=0.5   S        Ceftriaxone                     <=2    S        Cefuroxime                      <=4    S        Ciprofloxacin                  <=0.5   S        Ertapenem                      <=0.5   S        Gentamicin                      <=2    S        Levofloxacin                    <=1    S        Meropenem                       <=1    S        Piperacillin/Tazobactam        <=2/4   S        Tetracycline                    >8     R        Trimethoprim/Sulfamethoxazole <=0.5/9  S        _____________________________________________________________________________            S=SUSCEPTIBLE     I=INTERMEDIATE  R=RESISTANT      N/R=NOT REPORTED: Unable to determine S,I, and R based on            available MIC dilutions and/or CLSI guidelines.  _____________________________________________________________________________      Anaerobic culture [[id number]] Collected:09/05/13 1445    Specimen Information:Other / Synovial Fluid Updated:09/07/13 1056    Narrative:    ORDER#: [id number]                                    ORDERED BY: GOYAL, NITIN  SOURCE: Synovial Fluid right knee synovial fluid     COLLECTED:  09/05/13 14:45  ANTIBIOTICS AT COLL.:                                RECEIVED :  09/05/13 18:34  Culture Anaerobic                          PRELIM      09/07/13 10:56  09/07/13   No growth to date, final report to follow      Anaerobic culture [[id number]] Collected:09/05/13 1445     Specimen Information:Other / Tissue Updated:09/07/13 1039    Narrative:    ORDER#: [id number]                                    ORDERED BY: Ahmed Prima, NITIN  SOURCE: Tissue right knee #3                         COLLECTED:  09/05/13 14:45  ANTIBIOTICS AT COLL.:                                RECEIVED :  09/05/13 18:34  Culture Anaerobic                          PRELIM      09/07/13 10:39  09/07/13   No growth to date, final report to follow      Anaerobic culture [[id number]] Collected:09/05/13 1445    Specimen Information:Other / Tissue Updated:09/07/13 1034    Narrative:    ORDER#: [id number]                                    ORDERED BY: Ahmed Prima, NITIN  SOURCE: Tissue right knee #2                         COLLECTED:  09/05/13 14:45  ANTIBIOTICS AT COLL.:                                RECEIVED :  09/05/13 18:34  Culture Anaerobic                          PRELIM      09/07/13 10:33  09/07/13   No growth to date, final report to follow      Anaerobic culture [[id number]] Collected:09/05/13 1445    Specimen  Information:Other / Tissue Updated:09/07/13 1031    Narrative:    ORDER#: [id number]                                    ORDERED BY: Ahmed Prima, NITIN  SOURCE: Tissue right knee #1                         COLLECTED:  09/05/13 14:45  ANTIBIOTICS AT COLL.:                                RECEIVED :  09/05/13 18:34  Culture Anaerobic                          PRELIM      09/07/13 10:31  09/07/13   No growth to date, final report to follow            Recent CBC Recent Labs   [facility] 09/08/13 0454    RBC --    HGB 8.2*    HCT 26.7*    LABPLAT --     Recent BMP Recent Labs   Basename 09/08/13 0454    GLU 119*    BUN 17    CREAT 0.8    CA 8.2    NA 141    K 3.4*    CL 105    CO2 25     Recent PT/INR No results found for this basename: PTI,INR,COUM,COUMP in the last 24 hours    Rads:   Radiological Procedure reviewed.    Signed by: Garey Ham

## 2013-09-08 NOTE — Addendum Note (Signed)
Addendum  created 09/08/13 0747 by Laurine Blazer, CRNA    Modules edited:Anesthesia Events

## 2013-09-08 NOTE — Plan of Care (Signed)
Problem: Pain  Goal: Patient's pain/discomfort is manageable  Outcome: Progressing  Pain  Under control with tramadol, IROM barce in place,pt participating with PT and ambulates to BR  With min assistance.

## 2013-09-08 NOTE — Progress Notes (Signed)
Pt fitted with Irom knee brace post-op in OR

## 2013-09-08 NOTE — Progress Notes (Signed)
[facility] PROGRESS NOTE    Date Time: 09/08/2013 8:40 AM  Patient Name: [name]        Subjective:   "I'm doing OK."    Medications:      Scheduled Meds: PRN Meds:         amLODIPine 5 mg Oral Daily   aspirin EC 325 mg Oral Daily   ceftAZIDime 2 g Intravenous Q8H SCH   celecoxib 200 mg Oral Daily   cloNIDine 0.1 mg Oral Daily   docusate sodium 100 mg Oral Q12H SCH   gabapentin 300 mg Oral BID   hydrochlorothiazide 25 mg Oral Daily   nebivolol 5 mg Oral QHS   povidone-iodine  Both Eyes Once   tiotropium 18 mcg Inhalation QAM   traMADol 50 mg Oral Q6H SCH   vancomycin 1,250 mg Intravenous Q12H         Continuous Infusions:         HYDROcodone-acetaminophen 1 tablet Q4H PRN   HYDROcodone-acetaminophen 2 tablet Q6H PRN   HYDROcodone-acetaminophen 2 tablet Q4H PRN   HYDROmorphone 0.5 mg Q1H PRN   hydrOXYzine 25 mg Q12H PRN   ondansetron 4 mg Q8H PRN   ondansetron 4 mg Q8H PRN   senna-docusate 2 tablet BID PRN   zolpidem 5 mg QHS PRN         I personally reviewed all of the medications      Review of Systems:   A comprehensive review of systems was obtained from chart review and the patient  General ROS: negative  Ophthalmic ROS: negative  Endocrine ROS: negative  Respiratory ROS: no cough, shortness of breath, or wheezing  Cardiovascular ROS: no chest pain or dyspnea on exertion  Gastrointestinal ROS: no abdominal pain, change in bowel habits  Genito-Urinary ROS: no c/o dysuria, trouble voiding, or hematuria  Musculoskeletal ROS: right knee pain- managed with meds  Neurological ROS: no TIA or stroke symptoms, denies dizziness      Physical Exam:     Filed Vitals:    09/07/13 2026 09/08/13 0348 09/08/13 0541 09/08/13 0835   BP: 154/72 181/71 160/78 155/67   Pulse: 79 83  79   Temp: 96.7 F (35.9 C) 97.3 F (36.3 C)  97.2 F (36.2 C)   TempSrc:  Oral     Resp: 19 20  18    Height:       Weight:       SpO2: 99% 94%  95%         Intake and Output Summary (Last 24 hours) at Date Time  No intake or  output data in the 24 hours ending 09/08/13 0840    General appearance - alert, well appearing, and in no distress  Mental status - alert, oriented to person, place, and time  Chest - clear to auscultation, no wheezes, rales or rhonchi, symmetric air entry  Heart - normal rate, regular rhythm, normal S1, S2, no murmurs  Abdomen - bowel sounds normal, abdomen is soft, nontender, non distended  Neurological - alert, oriented, normal speech, no focal findings or movement disorder noted  Musculoskeletal - sitting chair  Extremities - peripheral pulses normal, no pedal edema, no clubbing or cyanosis  RLE w/ brace  Consultant note reviewed.    Labs:       Lab 09/08/13 0454 09/07/13 0421 09/05/13 1203   GLU 119* 126* 101*   BUN 17 19 13    CREAT 0.8 0.8 0.7   CA 8.2 8.3 9.1  NA 141 141 143   K 3.4* 3.5 3.9   CL 105 106 104   CO2 25 25 27    ALB -- -- --   PHOS -- -- --   MG -- -- --   EGFR >60.0 >60.0 >60.0   PT -- -- 14.0   PTT -- -- 31   INR -- -- 1.1       No results found for this basename: AST:3,ALT:3,ALKPHOS:3,ALB:3,BILITOTAL:3,AMY:3,LIP:3 in the last 168 hours      Lab 09/08/13 0454 09/07/13 0421 09/05/13 1203   WBC -- -- 11.73*   HGB 8.2* 8.2* 9.1*   HCT 26.7* 26.8* 28.8*   MCV -- -- 85.5   MCH -- -- 27.0*   MCHC -- -- 31.6*   RDW -- -- 15   MPV -- -- 10.3       No results found for this basename: TSH,FREET3,FREET4 in the last 168 hours        Rads:     Radiology Results (24 Hour)     ** No Results found for the last 24 hours. **                  Assessment and Plan:      Patient Active Problem List   Diagnosis   . S/p R TKA, readmitted 09/05/13. S/p R TKA I&D and Poly Exchange- Continue plan per ID and Ortho   . Low back pain   . Chronic obstructive pulmonary disease- continue meds, currently stable   . Hypertension- follow BP, continue meds   Post-operative Acute Anemia, secondary to blood loss, s/p surgery. Recommend adding Vitron C with discharge.  Hypokalemia- replete      Signed by: Sheryle Hail,  NP

## 2013-09-08 NOTE — Plan of Care (Signed)
Problem: Health Promotion  Goal: Knowledge - health resources  Extent of understanding and conveyed about healthcare resources.   Intervention: Discharge planning  CM met with pt and family to discuss [place] plan  Pt plans to [place] to Home   Transportation by family   PT will address equipment needs  Home Care referrals complete, adventist  Pt agreeable with plan

## 2013-09-08 NOTE — Plan of Care (Signed)
Problem: Safety  Goal: Patient will be free from injury during hospitalization  Outcome: Progressing  Safety precautions maintained, call bell with in reach, pt verbalized to call before getting OOB.

## 2013-09-09 ENCOUNTER — Encounter: Payer: Self-pay | Admitting: Orthopaedic Surgery

## 2013-09-09 LAB — POTASSIUM: Potassium: 3.7 mEq/L (ref 3.5–5.1)

## 2013-09-09 LAB — HEMOGLOBIN AND HEMATOCRIT, BLOOD
Hematocrit: 24.7 % — ABNORMAL LOW (ref 37.0–47.0)
Hgb: 7.8 g/dL — ABNORMAL LOW (ref 12.0–16.0)

## 2013-09-09 MED ORDER — CIPROFLOXACIN HCL 750 MG PO TABS
750.0000 mg | ORAL_TABLET | Freq: Two times a day (BID) | ORAL | Status: DC
Start: 2013-09-09 — End: 2013-10-14

## 2013-09-09 MED ORDER — FERROUS SULFATE 324 (65 FE) MG PO TBEC
DELAYED_RELEASE_TABLET | Freq: Every day | ORAL | Status: DC
Start: 2013-09-09 — End: 2013-10-03
  Filled 2013-09-09 (×45): qty 1

## 2013-09-09 MED ORDER — POLYETHYLENE GLYCOL 3350 17 G PO PACK
17.0000 g | PACK | Freq: Every day | ORAL | Status: DC
Start: 2013-09-09 — End: 2013-10-03
  Administered 2013-09-09 – 2013-09-29 (×12): 17 g via ORAL
  Filled 2013-09-09 (×20): qty 1

## 2013-09-09 MED ORDER — HYDROCODONE-ACETAMINOPHEN 5-325 MG PO TABS
1.0000 | ORAL_TABLET | ORAL | Status: DC | PRN
Start: 2013-09-09 — End: 2013-10-14

## 2013-09-09 MED ORDER — POTASSIUM CHLORIDE CRYS ER 20 MEQ PO TBCR
10.0000 meq | EXTENDED_RELEASE_TABLET | Freq: Every day | ORAL | Status: DC
Start: 2013-09-09 — End: 2013-10-03
  Administered 2013-09-10 – 2013-10-03 (×21): 10 meq via ORAL
  Filled 2013-09-09 (×24): qty 1

## 2013-09-09 MED ORDER — ALBUTEROL-IPRATROPIUM 2.5-0.5 (3) MG/3ML IN SOLN
3.0000 mL | RESPIRATORY_TRACT | Status: AC | PRN
Start: 2013-09-09 — End: 2013-09-18
  Administered 2013-09-10: 3 mL via RESPIRATORY_TRACT
  Filled 2013-09-09: qty 3

## 2013-09-09 MED ORDER — CELECOXIB 200 MG PO CAPS
200.0000 mg | ORAL_CAPSULE | Freq: Every day | ORAL | Status: DC
Start: 2013-09-09 — End: 2013-10-14

## 2013-09-09 MED ORDER — ONDANSETRON 4 MG PO TBDP
4.0000 mg | ORAL_TABLET | Freq: Three times a day (TID) | ORAL | Status: DC | PRN
Start: 2013-09-09 — End: 2013-10-14

## 2013-09-09 MED ORDER — DOCUSATE SODIUM 100 MG PO CAPS
100.0000 mg | ORAL_CAPSULE | Freq: Two times a day (BID) | ORAL | Status: DC
Start: 2013-09-09 — End: 2013-10-14

## 2013-09-09 MED ORDER — ASPIRIN 325 MG PO TABS
325.0000 mg | ORAL_TABLET | Freq: Every day | ORAL | Status: AC
Start: 2013-09-09 — End: 2013-10-09

## 2013-09-09 MED ORDER — TRAMADOL HCL 50 MG PO TABS
50.0000 mg | ORAL_TABLET | Freq: Four times a day (QID) | ORAL | Status: DC | PRN
Start: 2013-09-09 — End: 2013-10-14

## 2013-09-09 MED ORDER — SENNA 8.6 MG PO TABS
17.2000 mg | ORAL_TABLET | Freq: Every evening | ORAL | Status: DC
Start: 2013-09-09 — End: 2013-10-03
  Administered 2013-09-09 – 2013-09-30 (×16): 17.2 mg via ORAL
  Filled 2013-09-09 (×20): qty 2

## 2013-09-09 NOTE — Consults (Signed)
Reason for Consultation:   Apply incisional wound vac to right knee      History of present illness:  Ms Meares, she had surgery 09/05/13 for a right TKA Irrigation and Debridement and Poly Exchange.  Cultures Pending, gram negative rods growing in 4/4 cultures, patient is on  Vancomycin and ceftazidime.      Wound assessment:  Incision is approx 21.0 cm with sutures intact, some serous sanguinous drainage noted at distal end. Incision cleansed with NS, no sting skin prep applies vac applied per protocol, settings on 125 mmHg continous, no leaks secure.Patient tolerated procedure well. Ace wraps reapplied with leg brace.       Plan:  Maybe discharged on Thursday, will continue to follow as needed      Loyal Jacobson RN [facility] ext [phone]

## 2013-09-09 NOTE — PT Progress Note (Signed)
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      Patient's medical condition is appropriate for Physical Therapy  intervention at this time.  Patient cleared by nurse to participate in PT session.    Subjective: I'm doing fine just a little pain today.  Patient is agreeable to participation in the therapy session.     Pain Assessment  Pain Assessment: Numeric Scale (0-10)  Pain Score: 2-mild pain  Pain Location: Knee  Pain Intervention(s): Ambulation/increased activity          Objective:  Patient is in bed seen 4 Days Post-Op with dressings and hinged brace locked into extension.                                       Sensation is intact              Functional Mobility  Supine to Sit: Stand by assistance  Scooting: Min assist to right  Sit to Stand: Stand by assistance  Stand to Sit: Stand by assistance    Transfers  Bed to Chair: Min assist to left    Locomotion  Ambulation: stand by assistance  Ambulation Distance (Feet): 100 Feet  Pattern: R decreased stance time;L decreased stance time;decreased cadence;decreased step length  Weight Bearing Status: Total  Weight Bearing Percent: 100     Therapeutic Exercise  Quad Sets: 25  Glute Sets: 25  Hip Abduction: 25  Ankle Pumps: 25       Self Care and Home Management:  Pt did not perform self care activities at this time.    Educated the  patient to role of physical therapy, plan of care, goals  of therapy and pursed lip breathing.    Patient is in bed with all needs provided and call bell within reach.     Assessment:   Assessment  Assessment: Decreased LE ROM;Decreased LE strength;Gait impairment;Decreased endurance/activity tolerance  Prognosis: Good;With continued PT status post acute discharge.           Goals per Eval/Re-eval:     Goals  Goal Formulation: With patient/family  Time for Goal Acheivement: 7 visits  Goals: Select goal  Pt Will Go Supine To Sit: With stand by assist  Pt Will Perform Sit to Stand: With stand by assist  Pt Will Transfer to Toilet: Independently  Pt Will Transfer to Tub/Shower: With minimal assist  PT Will Demonstrate Car Transfer Technique: With minimal assist  Pt Will Ambulate: 101-150 feet;With stand by assist;Partly met  Pt Will Go Up / Down Stairs: 3-5 stairs;With stand by assist  Pt Will Perform Home Exer Program: Independently  Pt Will Perform LE Dressing w/Device:  With min assist          Plan:  Recommendation  Discharge Recommendation: Home with home health PT  DME Recommended for Discharge:  (pt has all required equipment)  PT - Next Visit Recommendation: 09/10/13  PT Frequency: 7x/wk    Continue plan of care.      Signature: Louann Liv, PTA 09/09/2013 12:33 PM

## 2013-09-09 NOTE — Progress Notes (Addendum)
PROGRESS NOTE    Date Time: 09/09/2013 7:02 AM  Patient Name: [name], [name]      Assessment:    Right TKA Irrigation and Debridement and Poly Exchange 09/05/13    Plan:   Continue PT - WBAT in Extension with brace on, No bending knee  Continue anticoagulation - ASA 325 bid  Cultures Pending, gram negative rods growing in 4/4 cultures  Continue Vancomycin and ceftazidime (fortaz)  ID consulted - Final Recommendations Cipro  D/c planning    Subjective:   Pain controlled, tolerating diet, participating in PT    Medications:     Current Facility-Administered Medications   Medication Dose Route Frequency   . amLODIPine  5 mg Oral Daily   . aspirin EC  325 mg Oral Daily   . celecoxib  200 mg Oral Daily   . ciprofloxacin  750 mg Oral Q12H SCH   . cloNIDine  0.1 mg Oral Daily   . docusate sodium  100 mg Oral Q12H SCH   . gabapentin  300 mg Oral BID   . hydrochlorothiazide  25 mg Oral Daily   . nebivolol  5 mg Oral QHS   . [COMPLETED] potassium chloride  40 mEq Oral Once   . povidone-iodine   Both Eyes Once   . tiotropium  18 mcg Inhalation QAM   . traMADol  50 mg Oral Q6H SCH   . [DISCONTINUED] ceftAZIDime  2 g Intravenous Q8H SCH   . [DISCONTINUED] vancomycin  1,250 mg Intravenous Q12H       Physical Exam:     Filed Vitals:    09/09/13 0506   BP: 133/63   Pulse: 71   Temp: 98.8 F (37.1 C)   Resp: 20   SpO2: 93%       Intake and Output Summary (Last 24 hours) at Date Time    Intake/Output Summary (Last 24 hours) at 09/09/13 0702  Last data filed at 09/09/13 0532   Gross per 24 hour   Intake    720 ml   Output      4 ml   Net    716 ml       General appearance - alert, well appearing, and in no distress  Musculoskeletal - Right knee  Dresing , drain serosanguinous d/c 09/07/13, area of drainage inferiorly increased from previous days  Fires quad/TA/EHL/GSC  SILT SP/DP/TN  WWP distally  Calves soft, nontender bilateral    Physical Therapy:   Pain Score: 0-No pain (09/09/13 2130)  Weight Bearing Percent:  (100%) (09/07/13  0915)  Ambulation Distance (Feet): 100 Feet (09/08/13 0851)  Right Lower Extremity ROM:  (knee brace locked in extension) (09/07/13 0915)  Left Lower Extremity ROM: within functional limits (09/07/13 0915)         Knee AROM : no knee flexion (09/07/13 0915)    Labs:     Results     Procedure Component Value Units Date/Time    Hemoglobin and hematocrit, blood [[id number]]  (Abnormal) Collected:09/09/13 0436    Specimen Information:Blood Updated:09/09/13 0523     Hgb 7.8 (L) g/dL      Hematocrit 29.5 (L) %           Recent CBC Recent Labs   Basename 09/09/13 0436    RBC --    HGB 7.8*    HCT 24.7*    LABPLAT --     Recent BMP No results found for this basename: GLU,BUN,CREAT,CA,NA,K,CL,CO2,AGAP in the last 24 hours  Recent PT/INR  No results found for this basename: PTI,INR,COUM,COUMP in the last 24 hours    Rads:   Radiological Procedure reviewed.    Signed by: Garey Ham

## 2013-09-09 NOTE — Plan of Care (Signed)
Problem: Pain  Goal: Patient's pain/discomfort is manageable  Outcome: Progressing  Pain well-controlled with current pain regimen.Medicated with Norco 2 tablets PO for 5/10 pain level,progressing to pain goal of 3/10.IROM brace on RLE in place,with wound vac in place as well.Continue with plan of care.

## 2013-09-09 NOTE — Progress Notes (Signed)
[facility] PROGRESS NOTE    Date Time: 09/09/2013 7:40 AM  Patient Name: [name],[name]        Subjective:   "I'm doing OK."    Medications:      Scheduled Meds: PRN Meds:           amLODIPine 5 mg Oral Daily   aspirin EC 325 mg Oral Daily   celecoxib 200 mg Oral Daily   ciprofloxacin 750 mg Oral Q12H SCH   cloNIDine 0.1 mg Oral Daily   docusate sodium 100 mg Oral Q12H SCH   gabapentin 300 mg Oral BID   hydrochlorothiazide 25 mg Oral Daily   nebivolol 5 mg Oral QHS   [COMPLETED] potassium chloride 40 mEq Oral Once   povidone-iodine  Both Eyes Once   tiotropium 18 mcg Inhalation QAM   traMADol 50 mg Oral Q6H SCH   [DISCONTINUED] ceftAZIDime 2 g Intravenous Q8H [facility]   [DISCONTINUED] vancomycin 1,250 mg Intravenous Q12H         Continuous Infusions:         HYDROcodone-acetaminophen 1 tablet Q4H PRN   HYDROcodone-acetaminophen 2 tablet Q6H PRN   HYDROcodone-acetaminophen 2 tablet Q4H PRN   HYDROmorphone 0.5 mg Q1H PRN   hydrOXYzine 25 mg Q12H PRN   ondansetron 4 mg Q8H PRN   ondansetron 4 mg Q8H PRN   senna-docusate 2 tablet BID PRN   zolpidem 5 mg QHS PRN         I personally reviewed all of the medications      Review of Systems:   A comprehensive review of systems was obtained from chart review and the patient  General ROS: negative  Ophthalmic ROS: negative  Endocrine ROS: negative  Respiratory ROS: no cough, shortness of breath, or wheezing  Cardiovascular ROS: no chest pain or dyspnea on exertion  Gastrointestinal ROS: no abdominal pain, change in bowel habits, no BM since Saturday-11/29.  Genito-Urinary ROS: no c/o dysuria, trouble voiding, or hematuria  Musculoskeletal ROS: right knee pain- managed with meds  Neurological ROS: no TIA or stroke symptoms, denies dizziness      Physical Exam:     Filed Vitals:    09/08/13 0541 09/08/13 0835 09/08/13 1954 09/09/13 0506   BP: 160/78 155/67 132/59 133/63   Pulse:  79 73 71   Temp:  97.2 F (36.2 C) 98.1 F (36.7 C) 98.8 F (37.1 C)   TempSrc:        Resp:  18 19 20    Height:       Weight:       SpO2:  95% 93% 93%         Intake and Output Summary (Last 24 hours) at Date Time    Intake/Output Summary (Last 24 hours) at 09/09/13 0740  Last data filed at 09/09/13 0532   Gross per 24 hour   Intake    720 ml   Output      4 ml   Net    716 ml       General appearance - alert, well appearing, and in no distress  Mental status - alert, oriented to person, place, and time  Chest - clear to auscultation, no wheezes, rales or rhonchi, symmetric air entry  Heart - normal rate, regular rhythm, normal S1, S2, no murmurs  Abdomen - bowel sounds normal, abdomen is soft, nontender, non distended  Neurological - alert, oriented, normal speech, no focal findings or movement disorder noted  Musculoskeletal -  sitting chair  Extremities - peripheral pulses normal, no pedal edema, no clubbing or cyanosis  RLE w/ brace  Consultant note reviewed.    Labs:       Lab 09/09/13 0435 09/08/13 0454 09/07/13 0421 09/05/13 1203   GLU -- 119* 126* 101*   BUN -- 17 19 13    CREAT -- 0.8 0.8 0.7   CA -- 8.2 8.3 9.1   NA -- 141 141 143   K 3.7 3.4* 3.5 --   CL -- 105 106 104   CO2 -- 25 25 27    ALB -- -- -- --   PHOS -- -- -- --   MG -- -- -- --   EGFR -- >60.0 >60.0 >60.0   PT -- -- -- 14.0   PTT -- -- -- 31   INR -- -- -- 1.1       No results found for this basename: AST:3,ALT:3,ALKPHOS:3,ALB:3,BILITOTAL:3,AMY:3,LIP:3 in the last 168 hours      Lab 09/09/13 0436 09/08/13 0454 09/07/13 0421 09/05/13 1203   WBC -- -- -- 11.73*   HGB 7.8* 8.2* 8.2* --   HCT 24.7* 26.7* 26.8* --   MCV -- -- -- 85.5   MCH -- -- -- 27.0*   MCHC -- -- -- 31.6*   RDW -- -- -- 15   MPV -- -- -- 10.3       No results found for this basename: TSH,FREET3,FREET4 in the last 168 hours        Rads:     Radiology Results (24 Hour)     ** No Results found for the last 24 hours. **                  Assessment and Plan:      Patient Active Problem List   Diagnosis   . S/p R TKA, readmitted 09/05/13. S/p R TKA I&D and Poly  Exchange- Continue plan per ID and Ortho   . Low back pain   . Chronic obstructive pulmonary disease- pt reports wheezing last night. Lungs clear currently. Continue Spiriva, will add prn albuterol nebs   . Hypertension-  BP stable, continue meds   Post-operative Acute Anemia, secondary to blood loss, s/p surgery. Will add Vitron C.  Hypokalemia-s/p repletion. Pt on KCL 8 meq daily prior to admission. Will order daily KCL supplements.  Constipation on colace. Pt also took Pericolace  On 11/30. Will adjust regimen.    Signed by: Sheryle Hail, NP

## 2013-09-09 NOTE — Progress Notes (Signed)
INFECTIOUS DISEASES PROGRESS NOTE    Date Time: 09/09/2013 2:20 PM  Patient Name: WILBERT, SCHOUTEN      Assessment and Recommendations:     Patient Active Problem List   Diagnosis   . Total knee replacement status   . Low back pain   . Chronic obstructive pulmonary disease   . Hypertension   PJI right total Knee - s/p I and D  And poly xc11/28/14.    Op cxs with almost  Pan-sensitive proteus.    (resist to tcn).    Anticipate high dose po  cipro 750 bid for at least 6 wks  And then suppression with lower dose (or alternative  Abx) thereafter.    Appreciate wcon note - pt to continue on wound vac tx.  With continued monitoring in hospital next few days.    Subjective:   Still has a lot of swelling and discomfort in right   leg    Antibiotics:   Cipro 750 bid     Pod 4    Medications:     Current Facility-Administered Medications   Medication Dose Route Frequency   . amLODIPine  5 mg Oral Daily   . aspirin EC  325 mg Oral Daily   . celecoxib  200 mg Oral Daily   . ciprofloxacin  750 mg Oral Q12H [facility]   . cloNIDine  0.1 mg Oral Daily   . docusate sodium  100 mg Oral Q12H SCH   . ferrous sulfate 324 mg-ascorbic acid 500 mg combo dose   Oral Daily   . gabapentin  300 mg Oral BID   . hydrochlorothiazide  25 mg Oral Daily   . nebivolol  5 mg Oral QHS   . polyethylene glycol  17 g Oral Daily   . potassium chloride  10 mEq Oral Daily   . [COMPLETED] potassium chloride  40 mEq Oral Once   . povidone-iodine   Both Eyes Once   . Senna  17.2 mg Oral QHS   . tiotropium  18 mcg Inhalation QAM   . traMADol  50 mg Oral Q6H [facility]       Central Access/Endovascular Hardware:     Active PICC Line / CVC Line / PIV Line / Drain / Airway / Intraosseous Line / Epidural Line / ART Line / Line / Wound / Pressure Ulcer / NG/OG Tube     Name   Placement date   Placement time   Site   Days    OnQ Pump (Incisional) 08/25/13 knee Right  08/25/13   0939   knee   15    Peripheral IV 09/08/13 Left Antecubital  09/08/13      Antecubital   1    Closed/Suction  Drain Right Knee 10 Fr.  09/05/13   1401   Knee   4    Incision Site 09/05/13 Knee Right  09/05/13   1437     3          Review of Systems:   Patient is without fevers, chills, nausea, vomiting, diarrhea, abdominal pain, cough, dyspnea, headache, rash.  Expected right knee pain and swelling     Physical Exam:     Filed Vitals:    09/08/13 0835 09/08/13 1954 09/09/13 0506 09/09/13 0809   BP: 155/67 132/59 133/63 149/65   Pulse: 79 73 71 69   Temp: 97.2 F (36.2 C) 98.1 F (36.7 C) 98.8 F (37.1 C) 97.3 F (36.3 C)   TempSrc:  Resp: 18 19 20 18    Height:       Weight:       SpO2: 95% 93% 93% 95%       Temp (24hrs), Avg:98.1 F (36.7 C), Min:97.3 F (36.3 C), Max:98.8 F (37.1 C)      In no apparent distress  Sclera anicteric, conjunctivae clear  Oral pharynx s erythema, exudate, or other significant lesions  No Thrush  Neck supple, s lymphadenopathy  Lungs Clear  Heart Regular rate and rhythm s murmurs, rubs, or gallops  Abdomen Soft, non tender, non distended, normal bowel sounds  Back No Costovertebral angle tenderness, no point tenderness along the spine  Extremities No Cyanosis, no clubbing  Right leg dressing is cdi, wnd vac intact with serosang  Drainage.  Mod swelling of right leg  Neurologic exam awake, alert, and cognitively intact  Skin without rash or other significant lesions      Labs:     Results     Procedure Component Value Units Date/Time    Anaerobic culture [[id number]] Collected:09/05/13 1445    Specimen Information:Other / Tissue Updated:09/09/13 1401    Narrative:    ORDER#: [id number]                                    ORDERED BY: Ahmed Prima, NITIN  SOURCE: Tissue right knee #3                         COLLECTED:  09/05/13 14:45  ANTIBIOTICS AT COLL.:                                RECEIVED :  09/05/13 18:34  Culture Anaerobic                          FINAL       09/09/13 14:01  09/09/13   No anaerobic growth      Anaerobic culture [[id number]] Collected:09/05/13 1445    Specimen  Information:Other / Synovial Fluid Updated:09/09/13 1400    Narrative:    ORDER#: [id number]                                    ORDERED BY: GOYAL, NITIN  SOURCE: Synovial Fluid right knee synovial fluid     COLLECTED:  09/05/13 14:45  ANTIBIOTICS AT COLL.:                                RECEIVED :  09/05/13 18:34  Culture Anaerobic                          FINAL       09/09/13 14:00  09/09/13   No anaerobic growth      Anaerobic culture [[id number]] Collected:09/05/13 1445    Specimen Information:Other / Tissue Updated:09/09/13 1358    Narrative:    ORDER#: [id number]                                    ORDERED BY: Ahmed Prima, NITIN  SOURCE: Tissue right knee #1  COLLECTED:  09/05/13 14:45  ANTIBIOTICS AT COLL.:                                RECEIVED :  09/05/13 18:34  Culture Anaerobic                          FINAL       09/09/13 13:58  09/09/13   No anaerobic growth      Anaerobic culture [[id number]] Collected:09/05/13 1445    Specimen Information:Other / Tissue Updated:09/09/13 1358    Narrative:    ORDER#: [id number]                                    ORDERED BY: Ahmed Prima, NITIN  SOURCE: Tissue right knee #2                         COLLECTED:  09/05/13 14:45  ANTIBIOTICS AT COLL.:                                RECEIVED :  09/05/13 18:34  Culture Anaerobic                          FINAL       09/09/13 13:58  09/09/13   No anaerobic growth      Potassium [[id number]] Collected:09/09/13 0435    Specimen Information:Blood Updated:09/09/13 0800     Potassium 3.7 mEq/L     Hemoglobin and hematocrit, blood [[id number]]  (Abnormal) Collected:09/09/13 0436    Specimen Information:Blood Updated:09/09/13 0523     Hgb 7.8 (L) g/dL      Hematocrit 40.3 (L) %           Lab 09/09/13 0436 09/05/13 1203   WBC -- 11.73*   HGB 7.8* --   HCT 24.7* --   PLT -- 249        Lab 09/09/13 0435 09/08/13 0454   NA -- 141   K 3.7 --   CL -- 105   CO2 -- 25   BUN -- 17   CREAT -- 0.8   CA -- 8.2   ALB -- --   PROT -- --    BILITOTAL -- --   ALKPHOS -- --   ALT -- --   AST -- --   GLU -- 119*       Lab 09/05/13 1203   INR 1.1   APTT --          Rads:     Radiology Results (24 Hour)     ** No Results found for the last 24 hours. **          Signed by: Ray Church, MD

## 2013-09-10 MED ORDER — BISACODYL 5 MG PO TBEC
10.0000 mg | DELAYED_RELEASE_TABLET | Freq: Every day | ORAL | Status: DC | PRN
Start: 2013-09-10 — End: 2013-10-03
  Filled 2013-09-10 (×2): qty 2

## 2013-09-10 MED ORDER — CALCIUM POLYCARBOPHIL 625 MG PO TABS
1250.0000 mg | ORAL_TABLET | Freq: Every day | ORAL | Status: DC
Start: 2013-09-10 — End: 2013-10-03
  Administered 2013-09-10 – 2013-10-03 (×19): 1250 mg via ORAL
  Filled 2013-09-10 (×22): qty 2

## 2013-09-10 NOTE — Progress Notes (Signed)
[facility] PROGRESS NOTE    Date Time: 09/10/2013 8:24 AM  Patient Name: [name]        Subjective:   "I'm doing OK."    Medications:      Scheduled Meds: PRN Meds:           amLODIPine 5 mg Oral Daily   aspirin EC 325 mg Oral Daily   celecoxib 200 mg Oral Daily   ciprofloxacin 750 mg Oral Q12H [facility]   cloNIDine 0.1 mg Oral Daily   docusate sodium 100 mg Oral Q12H SCH   ferrous sulfate 324 mg-ascorbic acid 500 mg combo dose  Oral Daily   gabapentin 300 mg Oral BID   hydrochlorothiazide 25 mg Oral Daily   nebivolol 5 mg Oral QHS   polyethylene glycol 17 g Oral Daily   potassium chloride 10 mEq Oral Daily   povidone-iodine  Both Eyes Once   Senna 17.2 mg Oral QHS   tiotropium 18 mcg Inhalation QAM   traMADol 50 mg Oral Q6H SCH         Continuous Infusions:         albuterol-ipratropium 3 mL Q4H PRN   HYDROcodone-acetaminophen 1 tablet Q4H PRN   HYDROcodone-acetaminophen 2 tablet Q6H PRN   HYDROcodone-acetaminophen 2 tablet Q4H PRN   HYDROmorphone 0.5 mg Q1H PRN   hydrOXYzine 25 mg Q12H PRN   ondansetron 4 mg Q8H PRN   ondansetron 4 mg Q8H PRN   senna-docusate 2 tablet BID PRN   zolpidem 5 mg QHS PRN         I personally reviewed all of the medications      Review of Systems:   A comprehensive review of systems was obtained from chart review and the patient  General ROS: negative  Ophthalmic ROS: negative  Endocrine ROS: negative  Respiratory ROS: no cough, shortness of breath, or wheezing  Cardiovascular ROS: no chest pain or dyspnea on exertion  Gastrointestinal ROS: no abdominal pain, last BM 11/30, +flatus, no n/v  Genito-Urinary ROS: no c/o dysuria, trouble voiding, or hematuria  Musculoskeletal ROS: right knee pain- managed with meds  Neurological ROS: no TIA or stroke symptoms, denies dizziness      Physical Exam:     Filed Vitals:    09/09/13 0506 09/09/13 0809 09/09/13 1938 09/10/13 0529   BP: 133/63 149/65 140/63 127/60   Pulse: 71 69 64 58   Temp: 98.8 F (37.1 C) 97.3 F (36.3 C) 97  F (36.1 C) 96.6 F (35.9 C)   TempSrc:   Oral Oral   Resp: 20 18 18 18    Height:       Weight:       SpO2: 93% 95% 97% 95%         Intake and Output Summary (Last 24 hours) at Date Time    Intake/Output Summary (Last 24 hours) at 09/10/13 3664  Last data filed at 09/10/13 0600   Gross per 24 hour   Intake    840 ml   Output      0 ml   Net    840 ml       General appearance - alert, well appearing, and in no distress  Mental status - alert, oriented to person, place, and time  Chest - clear to auscultation, no wheezes, rales or rhonchi, symmetric air entry  Heart - normal rate, regular rhythm, normal S1, S2, no murmurs  Abdomen - bowel sounds normal, abdomen is soft, nontender,  non distended  Neurological - alert, oriented, normal speech, no focal findings or movement disorder noted  Musculoskeletal - sitting chair  Extremities - peripheral pulses normal, no pedal edema, no clubbing or cyanosis  RLE w/ brace  Consultant note reviewed.    Labs:       Lab 09/09/13 0435 09/08/13 0454 09/07/13 0421 09/05/13 1203   GLU -- 119* 126* 101*   BUN -- 17 19 13    CREAT -- 0.8 0.8 0.7   CA -- 8.2 8.3 9.1   NA -- 141 141 143   K 3.7 3.4* 3.5 --   CL -- 105 106 104   CO2 -- 25 25 27    ALB -- -- -- --   PHOS -- -- -- --   MG -- -- -- --   EGFR -- >60.0 >60.0 >60.0   PT -- -- -- 14.0   PTT -- -- -- 31   INR -- -- -- 1.1       No results found for this basename: AST:3,ALT:3,ALKPHOS:3,ALB:3,BILITOTAL:3,AMY:3,LIP:3 in the last 168 hours      Lab 09/09/13 0436 09/08/13 0454 09/07/13 0421 09/05/13 1203   WBC -- -- -- 11.73*   HGB 7.8* 8.2* 8.2* --   HCT 24.7* 26.7* 26.8* --   MCV -- -- -- 85.5   MCH -- -- -- 27.0*   MCHC -- -- -- 31.6*   RDW -- -- -- 15   MPV -- -- -- 10.3       No results found for this basename: TSH,FREET3,FREET4 in the last 168 hours        Rads:     Radiology Results (24 Hour)     ** No Results found for the last 24 hours. **                  Assessment and Plan:      Patient Active Problem List   Diagnosis   .  S/p R TKA, readmitted 09/05/13. S/p R TKA I&D and Poly Exchange- Continue plan per ID and Ortho   . Low back pain   . Chronic obstructive pulmonary disease- pt reports wheezing last night. Lungs clear currently. Continue Spiriva, will add prn albuterol nebs   . Hypertension-  BP stable, continue meds   Post-operative Acute Anemia, secondary to blood loss, s/p surgery. Will add Vitron C.  Hypokalemia-s/p repletion. Pt on KCL 8 meq daily prior to admission. Will order daily KCL supplements.  Constipation- continue current meds. Pt states she uses fiber at home- will order fibercon. Dulcolax tabs order prn.     Signed by: Sheryle Hail, NP

## 2013-09-10 NOTE — Progress Notes (Signed)
PROGRESS NOTE    Date Time: 09/10/2013 7:05 AM  Patient Name: [name], [name]      Assessment:    Right TKA Irrigation and Debridement and Poly Exchange 09/05/13    Plan:   Continue PT - WBAT in Extension with brace on, No bending knee  Continue anticoagulation - ASA 325 bid  Cultures Pending, gram negative rods growing in 4/4 cultures  ID consulted - Pan-Sensitive Proteus ssp Final Recommendations Cipro 750 bid for 6 weeks  Wound vac placed yesterday for continued drainage  D/c planning    Subjective:   Pain controlled, tolerating diet, participating in PT    Medications:     Current Facility-Administered Medications   Medication Dose Route Frequency   . amLODIPine  5 mg Oral Daily   . aspirin EC  325 mg Oral Daily   . celecoxib  200 mg Oral Daily   . ciprofloxacin  750 mg Oral Q12H [facility]   . cloNIDine  0.1 mg Oral Daily   . docusate sodium  100 mg Oral Q12H [facility]   . ferrous sulfate 324 mg-ascorbic acid 500 mg combo dose   Oral Daily   . gabapentin  300 mg Oral BID   . hydrochlorothiazide  25 mg Oral Daily   . nebivolol  5 mg Oral QHS   . polyethylene glycol  17 g Oral Daily   . potassium chloride  10 mEq Oral Daily   . povidone-iodine   Both Eyes Once   . Senna  17.2 mg Oral QHS   . tiotropium  18 mcg Inhalation QAM   . traMADol  50 mg Oral Q6H SCH       Physical Exam:     Filed Vitals:    09/10/13 0529   BP: 127/60   Pulse: 58   Temp: 96.6 F (35.9 C)   Resp: 18   SpO2: 95%       Intake and Output Summary (Last 24 hours) at Date Time    Intake/Output Summary (Last 24 hours) at 09/10/13 5409  Last data filed at 09/10/13 0600   Gross per 24 hour   Intake    840 ml   Output      0 ml   Net    840 ml       General appearance - alert, well appearing, and in no distress  Musculoskeletal - Right knee  Dresing , drain serosanguinous d/c 09/07/13, area of drainage inferiorly increased from previous days  Fires quad/TA/EHL/GSC  SILT SP/DP/TN  WWP distally  Calves soft, nontender bilateral    Physical Therapy:   Pain Score:  1-mild pain (09/10/13 0645)  Weight Bearing Percent: 100  (09/09/13 1200)  Ambulation Distance (Feet): 100 Feet (09/09/13 1200)  Right Lower Extremity ROM:  (knee brace locked in extension) (09/07/13 0915)  Left Lower Extremity ROM: within functional limits (09/07/13 0915)         Knee AROM : no knee flexion (09/07/13 0915)    Labs:     Results     Procedure Component Value Units Date/Time    Anaerobic culture [[id number]] Collected:09/05/13 1445    Specimen Information:Other / Tissue Updated:09/09/13 1401    Narrative:    ORDER#: [id number]                                    ORDERED BY: Ahmed Prima, NITIN  SOURCE: Tissue right knee #3  COLLECTED:  09/05/13 14:45  ANTIBIOTICS AT COLL.:                                RECEIVED :  09/05/13 18:34  Culture Anaerobic                          FINAL       09/09/13 14:01  09/09/13   No anaerobic growth      Anaerobic culture [[id number]] Collected:09/05/13 1445    Specimen Information:Other / Synovial Fluid Updated:09/09/13 1400    Narrative:    ORDER#: [id number]                                    ORDERED BY: GOYAL, NITIN  SOURCE: Synovial Fluid right knee synovial fluid     COLLECTED:  09/05/13 14:45  ANTIBIOTICS AT COLL.:                                RECEIVED :  09/05/13 18:34  Culture Anaerobic                          FINAL       09/09/13 14:00  09/09/13   No anaerobic growth      Anaerobic culture [[id number]] Collected:09/05/13 1445    Specimen Information:Other / Tissue Updated:09/09/13 1358    Narrative:    ORDER#: [id number]                                    ORDERED BY: Ahmed Prima, NITIN  SOURCE: Tissue right knee #1                         COLLECTED:  09/05/13 14:45  ANTIBIOTICS AT COLL.:                                RECEIVED :  09/05/13 18:34  Culture Anaerobic                          FINAL       09/09/13 13:58  09/09/13   No anaerobic growth      Anaerobic culture [[id number]] Collected:09/05/13 1445    Specimen Information:Other / Tissue  Updated:09/09/13 1358    Narrative:    ORDER#: [id number]                                    ORDERED BY: Ahmed Prima, NITIN  SOURCE: Tissue right knee #2                         COLLECTED:  09/05/13 14:45  ANTIBIOTICS AT COLL.:                                RECEIVED :  09/05/13 18:34  Culture Anaerobic  FINAL       09/09/13 13:58  09/09/13   No anaerobic growth      Potassium [[id number]] Collected:09/09/13 0435    Specimen Information:Blood Updated:09/09/13 0800     Potassium 3.7 mEq/L           Recent CBC No results found for this basename: WHITEBLOODCE,RBC,HGB,HCT,,LABPLAT, NRBCA, REFLX, ANRBA in the last 24 hours  Recent BMP No results found for this basename: GLU,BUN,CREAT,CA,NA,K,CL,CO2,AGAP in the last 24 hours  Recent PT/INR No results found for this basename: PTI,INR,COUM,COUMP in the last 24 hours    Rads:   Radiological Procedure reviewed.    Signed by: Garey Ham

## 2013-09-10 NOTE — Progress Notes (Signed)
Infectious Diseases  Progress Note     Impression:   77 yof a/w RKPJI w/ pansensitive Proteus. Afeb, normal WBC. Baseline CRP 9.3 and ESR 70. Creat 0.8   Doing well post op.   Strategy to attempt to suppress this infection was d/w Pt and family for 25 minutes.   B/R/A outlined in detail  Will see in January and decide on decreasing dose of Cipro or change to other ABx      Recommendations:   Cipro 750 mg BID   Labs every 2 weeks: CBC w diff, CMP, CRP, ESR   Results of labs faxed to [place] at [phone]   F/U W/ ID in 5 weeks, call [phone] for appointment      Subjective:   No new problem  Review of Systems  Patient without nausea, vomiting, diarrhea, rash, cough, shortness of breath, abdominal or chest pain.      Objective:   BP 136/63  Pulse 61  Temp 96.3 F (35.7 C) (Oral)  Resp 18  Ht 1.6 m (5\' 3" )  Wt 112 kg (246 lb 14.6 oz)  BMI 43.75 kg/m2  SpO2 98%    Temp (24hrs), Avg:96.6 F (35.9 C), Min:96.3 F (35.7 C), Max:97 F (36.1 C)      General: awake, alert, oriented x 3; no acute distress.  Cardiovascular: regular rate and rhythm, no murmurs, rubs or gallops  Lungs: clear to auscultation bilaterally, without wheezing, rhonchi, or rales  Abdomen: soft, non-tender, non-distended; no palpable masses, no hepatosplenomegaly, normoactive bowel sounds, no rebound or guarding  Extremities: no clubbing, cyanosis, or edema          Labs:    Lab 09/09/13 0436 09/08/13 0454 09/07/13 0421 09/05/13 1203   WBC -- -- -- 11.73*   HGB 7.8* 8.2* 8.2* --   HCT 24.7* 26.7* 26.8* --   PLT -- -- -- 249   NEUTROPCT -- -- -- --   MONOPCT -- -- -- --       Lab 09/09/13 0435 09/08/13 0454 09/07/13 0421 09/05/13 1203   NA -- 141 141 143   K 3.7 3.4* 3.5 --   CL -- 105 106 104   CO2 -- 25 25 27    BUN -- 17 19 13    CREAT -- 0.8 0.8 0.7   CA -- 8.2 8.3 9.1   ALB -- -- -- --   PROT -- -- -- --   BILITOTAL -- -- -- --   ALKPHOS -- -- -- --   ALT -- -- -- --   AST -- -- -- --   GLU -- 119* 126* 101*

## 2013-09-10 NOTE — Progress Notes (Signed)
[name] MRN: [id number]  77 y.o.  female    NUTRITION:  Reason for assessment: Length of stay    Assessment : Pt reports appetite and intake lower than she is used to at home. "I am just getting full pretty fast." Denies any n/v/d. No other c/o.     Past Medical History   Diagnosis Date   . Hypertension    . Pneumonia    . Chronic obstructive pulmonary disease    . Headache(784.0)      not since surgery in 1957   . Arthritis    . Difficulty in walking(719.7)    . Low back pain      Wt Readings from Last 30 Encounters:   09/05/13 112 kg (246 lb 14.6 oz)   09/05/13 112 kg (246 lb 14.6 oz)   08/20/13 102.059 kg (225 lb)   08/20/13 102.059 kg (225 lb)     History     Social History   . Marital Status: Widowed     Spouse Name: N/A     Number of Children: N/A   . Years of Education: N/A     Occupational History   . Not on file.     Social History Main Topics   . Smoking status: Former Smoker -- 1.0 packs/day for 45 years     Quit date: 08/20/1998   . Smokeless tobacco: Never Used   . Alcohol Use: Yes      Comment: occassionally   . Drug Use: No   . Sexually Active:      Other Topics Concern   . Not on file     Social History Narrative   . No narrative on file       Active Hospital Problems    Diagnosis   . Total knee replacement status     No Known Allergies  GI Symptoms: No significant issues reported  Skin: s/p right TKA Irrigation and Debridement and Poly Exchange     Current Meds:       amLODIPine 5 mg Daily   aspirin EC 325 mg Daily   celecoxib 200 mg Daily   ciprofloxacin 750 mg Q12H [facility]   cloNIDine 0.1 mg Daily   docusate sodium 100 mg Q12H [facility]   ferrous sulfate 324 mg-ascorbic acid 500 mg combo dose  Daily   gabapentin 300 mg BID   hydrochlorothiazide 25 mg Daily   nebivolol 5 mg QHS   polycarbophil 1,250 mg Daily   polyethylene glycol 17 g Daily   potassium chloride 10 mEq Daily   povidone-iodine  Once   Senna 17.2 mg QHS   tiotropium 18 mcg QAM   traMADol 50 mg Q6H [facility]          Recent Labs:    Lab 09/09/13  0436 09/08/13 0454 09/07/13 0421 09/05/13 1203   WBC -- -- -- 11.73*   HGB 7.8* 8.2* 8.2* --   HCT 24.7* 26.7* 26.8* --   MCV -- -- -- 85.5   PLT -- -- -- 249       Lab 09/09/13 0435 09/08/13 0454 09/07/13 0421 09/05/13 1203   NA -- 141 141 143   K 3.7 3.4* 3.5 3.9   CL -- 105 106 104   CO2 -- 25 25 27    BUN -- 17 19 13    CREAT -- 0.8 0.8 0.7   GLU -- 119* 126* 101*   CA -- 8.2 8.3 9.1   MG -- -- -- --  PHOS -- -- -- --   EGFR -- >60.0 >60.0 >60.0     No results found for this basename: ALB:3, PREALB:3 in the last 168 hours    Intake/Output Summary (Last 24 hours) at 09/10/13 1552  Last data filed at 09/10/13 0600   Gross per 24 hour   Intake    840 ml   Output      0 ml   Net    840 ml       Current Diet Order  Diet regular    Anthropometrics  Height: 160 cm (5\' 3" )  Weight: 112 kg (246 lb 14.6 oz)  Weight Change: 0   IBW/kg (Calculated) Female: 56.37 kg  IBW/kg (Calculated) Female: 52.27 kg  BMI (calculated): 43.8 ; Class 3 obesity    Estimated Nutrition Needs:  Estimated Energy Needs  Total Energy Estimated Needs: 2240 -3025  Method for Estimating Needs: 22-27 kcal / kg   Estimated Protein Needs  Total Protein Estimated Needs: 112-134  Method for Estimating Needs: 1-1.2 g / kg      Fluid Needs  Total Fluid Estimated Needs: 2800   Method for Estimating Needs: 25 mL / kg     Learning & Discharge Planning Needs None at this time.   Religious/Cultural Food Practices:None reported.     Nutrition Diagnosis:   Predicted Suboptimal Energy Intake related to low appetite as evidenced by pt reports low PO intake and appetite, early onset satiety.      Overweight/obesity related to excessive energy intake prior to admission as evidenced by pt BMI (Class 3 obesity), pt reports typical intake about 2x current intake.    Intervention:  Continue to provide appropriate therapeutic diet; Encourage PO intake to adequately meet needs.     Goals:  Pt will complete at least 75% meals to adequately meet estimated needs.     M/E:  Will  continue to monitor PO intake, lab results, weight. Will continue to monitor per protocol within 7 days.     Harrell Gave, MS, RD, LDN

## 2013-09-10 NOTE — PT Progress Note (Signed)
Physical Therapy Treatment    Patient:  [name]        MRN#:  [id number]  Unit:  [place] [facility]        Room/Bed:  M442/M442.01    Time of treatment: Start Time: 1330 Stop Time: 1430  Time Calculation (min): 60 min  PT Received On: 09/10/13    Treatment #: PT Visit Number: 4/7    Precautions  Weight Bearing Status: RLE WBAT    Medical Diagnosis:   Infection of total knee replacement, initial encounter [996.66, V43.65] (INFECTED RT KNEE)  Total knee replacement status [V43.65] (Total knee replacement status)  Surgical Procedure:Right   Procedure(s):  ARTHROPLASTY, KNEE, REVISION TOTAL performed by  Lane Hacker, MD on 09/05/2013.      Patient's medical condition is appropriate for Physical Therapy  intervention at this time.  Patient cleared by nurse to participate in PT session.    Subjective: I got my wound vac.  Patient is agreeable to participation in the therapy session.     Pain Assessment  Pain Assessment: No/denies pain          Objective:  Patient is in bed seen 5 Days Post-Op with dressings and wound vac.                                       Sensation is intact              Functional Mobility  Supine to Sit: Stand by assistance  Scooting: Stand by assistance  Sit to Supine: Stand by assistance  Sit to Stand: Min assist  Stand to Sit: Min assist         Locomotion  Ambulation: stand by assistance  Ambulation Distance (Feet): 50 Feet  Pattern: decreased cadence;decreased step length  Weight Bearing Status: Total  Weight Bearing Percent: 100     Therapeutic Exercise  Quad Sets: 25  Glute Sets: 25  Hip Abduction: 25  Knee AROM Short Arc Quad: 25  Ankle Pumps: 25       Self Care and Home Management:  Pt did not perform self care activities at this time.    Educated the patient to role of physical therapy, plan of care, goals  of therapy and pursed lip breathing.    Patient is seated in wheelchair in waiting room with visitor with all needs provided and call bell within reach. RN  notified of session outcome.    Assessment:   Assessment  Assessment: Decreased LE ROM;Decreased LE strength;Gait impairment;Decreased functional mobility  Prognosis: Good;With continued PT status post acute discharge.           Goals per Eval/Re-eval:     Goals  Goal Formulation: With patient/family  Time for Goal Acheivement: 7 visits  Goals: Select goal  Pt Will Go Supine To Sit: With stand by assist  Pt Will Perform Sit to Stand: With stand by assist  Pt Will Transfer to Toilet: Independently  Pt Will Transfer to Tub/Shower: With minimal assist  PT Will Demonstrate Car Transfer Technique: With minimal assist  Pt Will Ambulate: 101-150 feet;With stand by assist;Partly met  Pt Will Go Up / Down Stairs: 3-5 stairs;With stand by assist  Pt Will Perform Home Exer Program: Independently  Pt Will Perform LE Dressing w/Device: With min assist          Plan:  Recommendation  Discharge  Recommendation: Home with home health PT  DME Recommended for Discharge:  (pt has all required equipment)  PT - Next Visit Recommendation: 09/11/13  PT Frequency: 7x/wk    Continue plan of care.      Signature: Louann Liv, PTA 09/10/2013 2:43 PM

## 2013-09-10 NOTE — Plan of Care (Signed)
Problem: Health Promotion  Goal: Knowledge - health resources  Extent of understanding and conveyed about healthcare resources.   Intervention: Discharge planning  Faxed clinical update to [facility] per her request

## 2013-09-11 ENCOUNTER — Inpatient Hospital Stay: Payer: No Typology Code available for payment source

## 2013-09-11 LAB — B-TYPE NATRIURETIC PEPTIDE: B-Natriuretic Peptide: 640 pg/mL — ABNORMAL HIGH (ref 0–100)

## 2013-09-11 LAB — GFR: EGFR: >60

## 2013-09-11 LAB — BASIC METABOLIC PANEL
BUN: 21 mg/dL (ref 7–21)
CO2: 25 mEq/L (ref 22–29)
Calcium: 8.6 mg/dL (ref 7.9–10.6)
Chloride: 102 mEq/L (ref 98–107)
Creatinine: 0.8 mg/dL (ref 0.6–1.0)
Glucose: 100 mg/dL (ref 70–100)
Potassium: 4 mEq/L (ref 3.5–5.1)
Sodium: 142 mEq/L (ref 136–145)

## 2013-09-11 LAB — MAGNESIUM: Magnesium: 1.9 mg/dL (ref 1.6–2.6)

## 2013-09-11 MED ORDER — FUROSEMIDE 10 MG/ML IJ SOLN
20.0000 mg | Freq: Once | INTRAMUSCULAR | Status: AC
Start: 2013-09-11 — End: 2013-09-11
  Administered 2013-09-11: 20 mg via INTRAVENOUS
  Filled 2013-09-11: qty 4

## 2013-09-11 MED ORDER — ALBUTEROL-IPRATROPIUM 2.5-0.5 (3) MG/3ML IN SOLN
3.0000 mL | RESPIRATORY_TRACT | Status: AC
Start: 2013-09-11 — End: 2013-09-18
  Administered 2013-09-11 – 2013-09-18 (×16): 3 mL via RESPIRATORY_TRACT
  Filled 2013-09-11 (×16): qty 3

## 2013-09-11 NOTE — Plan of Care (Signed)
Problem: Pain  Goal: Patient's pain/discomfort is manageable  Outcome: Progressing  Pain well-controlled with current pain mgt.IROM brace on RLE in place,wound vac intact.Positioned with pillows.Safety maintained.

## 2013-09-11 NOTE — PT Progress Note (Addendum)
Physical Therapy Note    Physical Therapy Treatment    Patient:  [name]        MRN#:  [id number]  Unit:  [place] [facility]        Room/Bed:  M442/M442.01    Time of treatment: Start Time: 1335 Stop Time: 1440  Time Calculation (min): 65 min  PT Received On: 09/11/13    Treatment #: PT Visit Number: 5/7    Precautions  Weight Bearing Status: RLE WBAT  Weight Bearing Percent: 100   Total Knee Replacement: hinge brace    Medical Diagnosis:   Infection of total knee replacement, initial encounter [996.66, V43.65] (INFECTED RT KNEE)  Total knee replacement status [V43.65] (Total knee replacement status)  Surgical Procedure:Right  Procedure(s):  ARTHROPLASTY, KNEE, REVISION TOTAL performed by  Lane Hacker, MD on 09/05/2013.      Patient's medical condition is appropriate for Physical Therapy  intervention at this time.  Patient cleared by nurse to participate in PT session, nursing reports status.    Subjective: I have had a long day   Patient is agreeable to participation in the therapy session.     Pain Assessment  Pain Assessment: Numeric Scale (0-10)  Pain Score: 2-mild pain  Pain Location: Knee          Objective:  Patient is in bed seen 6 Days Post-Op with dressings and TEDs.                                       Wound vac to be D/C before leaving hospital           Functional Mobility  Supine to Sit: Stand by assistance  Scooting: Stand by assistance  Sit to Supine: Stand by assistance  Sit to Stand: Stand by assistance  Stand to Sit: Stand by assistance         Locomotion  Ambulation:  (CGA)  Ambulation Distance (Feet): 300 Feet (150x2)  Pattern: decreased step length;decreased cadence  Stair Management: stand by assistance;with cane  Number of Stairs: 8     Therapeutic Exercise  Straight Leg Raise: 10  Quad Sets: 25  Glute Sets: 25  Hip Abduction: 25  Knee AROM Short Arc Quad: 25  Ankle Pumps: 25       Self Care and Home Management:  Pt did not perform self care activities at this  time.    Educated the patient to role of physical therapy, plan of care, goals  of therapy and HEP, safety with mobility and ADLs, energy conservation techniques, discharge instructions, home safety.    Patient is seated in a bedside chair with all needs provided and call bell within reach. RN notified of session outcome.    Assessment:   Assessment  Assessment: Decreased LE ROM;Decreased LE strength;Gait impairment;Decreased balance  Prognosis: Good;With continued PT status post acute discharge.           Goals per Eval/Re-eval:     Goals  Goal Formulation: With patient/family  Time for Goal Acheivement: 7 visits  Goals: Select goal  Pt Will Go Supine To Sit: With stand by assist;Goal met  Pt Will Perform Sit to Stand: With stand by assist  Pt Will Transfer to Toilet: Independently  Pt Will Transfer to Tub/Shower: With minimal assist  PT Will Demonstrate Car Transfer Technique: With minimal assist  Pt Will Ambulate: 101-150 feet;With  stand by assist;Partly met  Pt Will Go Up / Down Stairs: 3-5 stairs;With stand by assist  Pt Will Perform Home Exer Program: Independently  Pt Will Perform LE Dressing w/Device: With min assist          Plan:  Recommendation  Discharge Recommendation: Home with home health PT  DME Recommended for Discharge:  (pt has all required equipment)  PT - Next Visit Recommendation: 09/11/13  PT Frequency: 7x/wk    Continue plan of care.      Signature: Clearnce Sorrel, PTA 09/11/2013 2:46 PM

## 2013-09-11 NOTE — Progress Notes (Signed)
[facility] PRO[facility]ESS NOTE    Date Time: 09/11/2013 8:29 AM  Patient Name: [name]        Subjective:   "I'm doing OK, but I'm wheezing."    Medications:      Scheduled Meds: PRN Meds:           amLODIPine 5 mg Oral Daily   aspirin EC 325 mg Oral Daily   celecoxib 200 mg Oral Daily   ciprofloxacin 750 mg Oral Q12H [facility]   cloNIDine 0.1 mg Oral Daily   docusate sodium 100 mg Oral Q12H [facility]   ferrous sulfate 324 mg-ascorbic acid 500 mg combo dose  Oral Daily   gabapentin 300 mg Oral BID   hydrochlorothiazide 25 mg Oral Daily   nebivolol 5 mg Oral QHS   polycarbophil 1,250 mg Oral Daily   polyethylene glycol 17 g Oral Daily   potassium chloride 10 mEq Oral Daily   povidone-iodine  Both Eyes Once   Senna 17.2 mg Oral QHS   tiotropium 18 mcg Inhalation QAM   traMADol 50 mg Oral Q6H [facility]         Continuous Infusions:         albuterol-ipratropium 3 mL Q4H PRN   bisacodyl 10 mg QD PRN   HYDROcodone-acetaminophen 1 tablet Q4H PRN   HYDROcodone-acetaminophen 2 tablet Q6H PRN   HYDROcodone-acetaminophen 2 tablet Q4H PRN   HYDROmorphone 0.5 mg Q1H PRN   hydrOXYzine 25 mg Q12H PRN   ondansetron 4 mg Q8H PRN   ondansetron 4 mg Q8H PRN   senna-docusate 2 tablet BID PRN   zolpidem 5 mg QHS PRN         I personally reviewed all of the medications      Review of Systems:   A comprehensive review of systems was obtained from chart review and the patient  General ROS: negative  Ophthalmic ROS: negative  Endocrine ROS: negative  Respiratory ROS: no cough, shortness of breath, or wheezing  Cardiovascular ROS: no chest pain or some dyspnea on exertion (at baseline  Gastrointestinal ROS: no abdominal pain, last BM 11/30, +flatus, no n/v  Genito-Urinary ROS: no c/o dysuria, trouble voiding, or hematuria  Musculoskeletal ROS: right knee pain- managed with meds  Neurological ROS: no TIA or stroke symptoms, denies dizziness      Physical Exam:     Filed Vitals:    09/10/13 0826 09/10/13 1618 09/10/13 2003 09/11/13 0443   BP:  136/63 105/48 145/67 139/59   Pulse: 61 59 64 72   Temp: 96.3 F (35.7 C) 96.3 F (35.7 C) 97.3 F (36.3 C) 98.1 F (36.7 C)   TempSrc:   Oral Oral   Resp: 18 20 17 18    Height:       Weight:       SpO2: 98% 99% 100% 98%         Intake and Output Summary (Last 24 hours) at Date Time    Intake/Output Summary (Last 24 hours) at 09/11/13 2542  Last data filed at 09/11/13 0600   Gross per 24 hour   Intake    840 ml   Output      2 ml   Net    838 ml       General appearance - alert, well appearing, and in no distress  Mental status - alert, oriented to person, place, and time  Chest - clear to auscultation, no wheezes, rales or rhonchi, symmetric air entry  Heart -  normal rate, regular rhythm, normal S1, S2, no murmurs  Abdomen - bowel sounds normal, abdomen is soft, nontender, non distended, obese  Neurological - alert, oriented, normal speech, no focal findings or movement disorder noted  Musculoskeletal - laying in bed  Extremities - peripheral pulses normal, no pedal edema, no clubbing or cyanosis  RLE w/ brace  Consultant note reviewed.    Labs:       Lab 09/09/13 0435 09/08/13 0454 09/07/13 0421 09/05/13 1203   GLU -- 119* 126* 101*   BUN -- 17 19 13    CREAT -- 0.8 0.8 0.7   CA -- 8.2 8.3 9.1   NA -- 141 141 143   K 3.7 3.4* 3.5 --   CL -- 105 106 104   CO2 -- 25 25 27    ALB -- -- -- --   PHOS -- -- -- --   MG -- -- -- --   EGFR -- >60.0 >60.0 >60.0   PT -- -- -- 14.0   PTT -- -- -- 31   INR -- -- -- 1.1       No results found for this basename: AST:3,ALT:3,ALKPHOS:3,ALB:3,BILITOTAL:3,AMY:3,LIP:3 in the last 168 hours      Lab 09/09/13 0436 09/08/13 0454 09/07/13 0421 09/05/13 1203   WBC -- -- -- 11.73*   HGB 7.8* 8.2* 8.2* --   HCT 24.7* 26.7* 26.8* --   MCV -- -- -- 85.5   MCH -- -- -- 27.0*   MCHC -- -- -- 31.6*   RDW -- -- -- 15   MPV -- -- -- 10.3       No results found for this basename: TSH,FREET3,FREET4 in the last 168 hours        Rads:     Radiology Results (24 Hour)     ** No Results found for the  last 24 hours. **                  Assessment and Plan:      Patient Active Problem List   Diagnosis   . S/p R TKA, readmitted 09/05/13. S/p R TKA I&D and Poly Exchange- Continue plan per ID and Ortho   . Low back pain   . Chronic obstructive pulmonary disease- pt and nursing report wheezing last night. +wheezing noted on exam today.  RA O2 sat 94%.  PA & Lat CXR ordered - No acute cardiopulmonary process.  Labs ordered- BNP 640. No baseline available. Will order IV lasix for fluid overload.  Continue s/c Spiriva and add duonebs QID   . Hypertension-  BP stable   Post-operative Acute Anemia, secondary to blood loss, s/p surgery. Will add Vitron C.  Hypokalemia-s/p repletion. stable.  Constipation- continue current meds.  D/w Dr. Isidor Holts.    Signed by: Sheryle Hail, NP

## 2013-09-11 NOTE — Progress Notes (Signed)
PROGRESS NOTE    Date Time: 09/11/2013 8:16 AM  Patient Name: [name], [name]      Assessment:    Right TKA Irrigation and Debridement and Poly Exchange 09/05/13    Plan:   Continue PT - WBAT in Extension with brace on, No bending knee  Continue anticoagulation - ASA 325 bid  ID consulted - Pan-Sensitive Proteus ssp Final Recommendations Cipro 750 bid for 6 weeks  Wound vac placed yesterday for continued drainage, increased output since 09/10/13  D/c planning    Subjective:   Pain controlled, tolerating diet, participating in PT    Medications:     Current Facility-Administered Medications   Medication Dose Route Frequency   . amLODIPine  5 mg Oral Daily   . aspirin EC  325 mg Oral Daily   . celecoxib  200 mg Oral Daily   . ciprofloxacin  750 mg Oral Q12H [facility]   . cloNIDine  0.1 mg Oral Daily   . docusate sodium  100 mg Oral Q12H [facility]   . ferrous sulfate 324 mg-ascorbic acid 500 mg combo dose   Oral Daily   . gabapentin  300 mg Oral BID   . hydrochlorothiazide  25 mg Oral Daily   . nebivolol  5 mg Oral QHS   . polycarbophil  1,250 mg Oral Daily   . polyethylene glycol  17 g Oral Daily   . potassium chloride  10 mEq Oral Daily   . povidone-iodine   Both Eyes Once   . Senna  17.2 mg Oral QHS   . tiotropium  18 mcg Inhalation QAM   . traMADol  50 mg Oral Q6H [facility]       Physical Exam:     Filed Vitals:    09/11/13 0443   BP: 139/59   Pulse: 72   Temp: 98.1 F (36.7 C)   Resp: 18   SpO2: 98%       Intake and Output Summary (Last 24 hours) at Date Time    Intake/Output Summary (Last 24 hours) at 09/11/13 0816  Last data filed at 09/11/13 0600   Gross per 24 hour   Intake    840 ml   Output      2 ml   Net    838 ml       General appearance - alert, well appearing, and in no distress  Musculoskeletal - Right knee  Dresing , drain serosanguinous d/c 09/07/13, area of drainage inferiorly increased from previous days  Fires quad/TA/EHL/GSC  SILT SP/DP/TN  WWP distally  Calves soft, nontender bilateral    Physical Therapy:   Pain  Score: 3-mild pain (09/11/13 0619)  Weight Bearing Percent: 100  (09/10/13 1300)  Ambulation Distance (Feet): 50 Feet (09/10/13 1300)  Right Lower Extremity ROM:  (knee brace locked in extension) (09/07/13 0915)  Left Lower Extremity ROM: within functional limits (09/07/13 0915)         Knee AROM : no knee flexion (09/07/13 0915)    Labs:     Results     ** No Results found for the last 24 hours. **          Recent CBC No results found for this basename: WHITEBLOODCE,RBC,HGB,HCT,,LABPLAT, NRBCA, REFLX, ANRBA in the last 24 hours  Recent BMP No results found for this basename: GLU,BUN,CREAT,CA,NA,K,CL,CO2,AGAP in the last 24 hours  Recent PT/INR No results found for this basename: PTI,INR,COUM,COUMP in the last 24 hours    Rads:   Radiological Procedure reviewed.  Signed by: Garey Ham

## 2013-09-11 NOTE — Progress Notes (Incomplete)
Wound vac discontinued per Dr. Ahmed Prima.  Right knee incision with small amount of serosanguinous drainage.  Telfa/ ABD/ ace wrap compression dressing applied.  IROM brace re-applied.

## 2013-09-12 LAB — BASIC METABOLIC PANEL
BUN: 19 mg/dL (ref 7–21)
CO2: 26 mEq/L (ref 22–29)
Calcium: 8.4 mg/dL (ref 7.9–10.6)
Chloride: 106 mEq/L (ref 98–107)
Creatinine: 0.8 mg/dL (ref 0.6–1.0)
Glucose: 104 mg/dL — ABNORMAL HIGH (ref 70–100)
Potassium: 3.8 mEq/L (ref 3.5–5.1)
Sodium: 143 mEq/L (ref 136–145)

## 2013-09-12 LAB — B-TYPE NATRIURETIC PEPTIDE: B-Natriuretic Peptide: 525 pg/mL — ABNORMAL HIGH (ref 0–100)

## 2013-09-12 LAB — TYPE AND SCREEN
AB Screen Gel: NEGATIVE
ABO Rh: B POS

## 2013-09-12 LAB — GFR: EGFR: >60

## 2013-09-12 LAB — MAGNESIUM: Magnesium: 1.9 mg/dL (ref 1.6–2.6)

## 2013-09-12 MED ORDER — FUROSEMIDE 10 MG/ML IJ SOLN
20.0000 mg | Freq: Once | INTRAMUSCULAR | Status: AC
Start: 2013-09-13 — End: 2013-09-13
  Administered 2013-09-13: 20 mg via INTRAVENOUS
  Filled 2013-09-12: qty 4

## 2013-09-12 MED ORDER — VANCOMYCIN HCL 1000 MG IV SOLR
1000.0000 mg | INTRAVENOUS | Status: AC
Start: 2013-09-15 — End: 2013-09-15
  Administered 2013-09-15: 1000 mg via INTRAVENOUS
  Filled 2013-09-12: qty 1000

## 2013-09-12 MED ORDER — CEFAZOLIN SODIUM-DEXTROSE 2-3 GM-% IV SOLR
2.0000 g | Freq: Three times a day (TID) | INTRAVENOUS | Status: DC
Start: 2013-09-12 — End: 2013-09-15
  Administered 2013-09-12 – 2013-09-15 (×9): 2 g via INTRAVENOUS
  Filled 2013-09-12 (×11): qty 50

## 2013-09-12 MED ORDER — FUROSEMIDE 10 MG/ML IJ SOLN
20.0000 mg | Freq: Once | INTRAMUSCULAR | Status: DC
Start: 2013-09-12 — End: 2013-09-12

## 2013-09-12 MED ORDER — BISACODYL 10 MG RE SUPP
10.0000 mg | Freq: Once | RECTAL | Status: AC
Start: 2013-09-12 — End: 2013-09-12
  Administered 2013-09-12: 10 mg via RECTAL
  Filled 2013-09-12: qty 1

## 2013-09-12 NOTE — Progress Notes (Signed)
Infectious Diseases  Progress Note     Impression:   77 yof a/w RKPJI w/ pansensitive Proteus. Afeb, normal WBC. Baseline CRP 9.3 and ESR 70. Creat 0.8   Has continued to drain post op. Plans laid for resection arthroplasty and spacer on Monday.  No new problems noted.     Recommendations:   Will go with Ancef preop w/ dose of Vanco on call to OR  Will f/u on Mon op Cx as well and see Tues and PRN, call if probs or questions    Subjective:   Pain about the same. No BM yet.    Review of Systems  Patient without nausea, vomiting, diarrhea, rash, cough, shortness of breath, abdominal or chest pain.      Objective:   BP 146/65  Pulse 81  Temp 97.7 F (36.5 C) (Oral)  Resp 18  Ht 1.6 m (5\' 3" )  Wt 112 kg (246 lb 14.6 oz)  BMI 43.75 kg/m2  SpO2 94%    Temp (24hrs), Avg:97.5 F (36.4 C), Min:96.6 F (35.9 C), Max:98.2 F (36.8 C)      General: awake, alert, oriented x 3; no acute distress.  Cardiovascular: regular rate and rhythm, no murmurs, rubs or gallops  Lungs: clear to auscultation bilaterally, without wheezing, rhonchi, or rales  Abdomen: soft, rotund, non focal  Extremities: no clubbing, cyanosis, or edema  Knee dressing c/d/i        Labs:    Lab 09/09/13 0436 09/08/13 0454 09/07/13 0421   WBC -- -- --   HGB 7.8* 8.2* 8.2*   HCT 24.7* 26.7* 26.8*   PLT -- -- --   NEUTROPCT -- -- --   MONOPCT -- -- --       Lab 09/12/13 0423 09/11/13 1139 09/09/13 0435 09/08/13 0454   NA 143 142 -- 141   K 3.8 4.0 3.7 --   CL 106 102 -- 105   CO2 26 25 -- 25   BUN 19 21 -- 17   CREAT 0.8 0.8 -- 0.8   CA 8.4 8.6 -- 8.2   ALB -- -- -- --   PROT -- -- -- --   BILITOTAL -- -- -- --   ALKPHOS -- -- -- --   ALT -- -- -- --   AST -- -- -- --   GLU 104* 100 -- 119*

## 2013-09-12 NOTE — Consults (Signed)
CONSULTATION    Date Time: 09/12/2013 1:05 PM  Patient Name: [name]  Requesting Physician: Lane Hacker, MD      Reason for Consultation:   Medical Management    Assessment:   1) Right Total knee prosthetic joint infection, s/p I&D with polyethylene component exchange on 11/28 - pan sensitive Proteus in wound cx - ID recommends ancef and vanco dose prior to surgery. Plan for right knee revision on Monday. Patient is cleared for surgery.   2) COPD - wheezing improved, most likely a mild exacerbation, NO CHF. Continue duonebs around the clock. Hold spiriva.   3) HTN - controlled   4) Constipation - continue meds.   5) Anemia, post surgery - plan to transfuse 2 units PRBCs tomorrow with IV lasix in between.     Plan:   See above, DVT prophylaxis per orthopedic team.     History:   [name] is a 77 y.o. female who presented to the hospital on 09/05/2013 with infected right knee after TKA on 11/17.   She started having worsening knee pain, swelling, and a history of fevers after surgery. At [facility] visit she had knee aspiration. Labs showed CRP of 9.3 and ESR of 70. She is now admitted at [facility] for treatment.       Past Medical History:     Past Medical History   Diagnosis Date   . Hypertension    . Pneumonia    . Chronic obstructive pulmonary disease    . Headache(784.0)      not since surgery in 1957   . Arthritis    . Difficulty in walking(719.7)    . Low back pain        Past Surgical History:     Past Surgical History   Procedure Date   . Cervical spine surgery    . Lumbar spine surgery posterior w/ hardware   . Tonsillectomy    . Brain surgery R side/ 1957 Optic nerve pressure   . Eye surgery      cataracts   . Colonoscopy    . Tubal ligation 1961   . Arthroplasty, knee, total 08/25/2013     Procedure: ARTHROPLASTY, KNEE, TOTAL;  Surgeon: Lane Hacker, MD;  Location: [facility];  Service: Orthopedics;  Laterality: Right;   . Arthroplasty, knee, revision total 09/05/2013     Procedure:  ARTHROPLASTY, KNEE, REVISION TOTAL;  Surgeon: Lane Hacker, MD;  Location: [facility];  Service: Orthopedics;  Laterality: Right;  RT KNEE WASHOUT & POLY EXCHANGE **POA/CH** (STRYKER)        Family History:   No family history on file.    Social History:     History     Social History   . Marital Status: Widowed     Spouse Name: N/A     Number of Children: N/A   . Years of Education: N/A     Social History Main Topics   . Smoking status: Former Smoker -- 1.0 packs/day for 45 years     Quit date: 08/20/1998   . Smokeless tobacco: Never Used   . Alcohol Use: Yes      Comment: occassionally   . Drug Use: No   . Sexually Active:      Other Topics Concern   . Not on file     Social History Narrative   . No narrative on file       Allergies:   No Known Allergies  Medications:     Current Facility-Administered Medications   Medication Dose Route Frequency   . albuterol-ipratropium  3 mL Nebulization Q4H WA   . amLODIPine  5 mg Oral Daily   . aspirin EC  325 mg Oral Daily   . bisacodyl  10 mg Rectal Once   . ceFAZolin  2 g Intravenous Q8H SCH   . celecoxib  200 mg Oral Daily   . cloNIDine  0.1 mg Oral Daily   . docusate sodium  100 mg Oral Q12H SCH   . ferrous sulfate 324 mg-ascorbic acid 500 mg combo dose   Oral Daily   . [COMPLETED] furosemide  20 mg Intravenous Once   . [START ON 09/13/2013] furosemide  20 mg Intravenous Once   . gabapentin  300 mg Oral BID   . hydrochlorothiazide  25 mg Oral Daily   . nebivolol  5 mg Oral QHS   . polycarbophil  1,250 mg Oral Daily   . polyethylene glycol  17 g Oral Daily   . potassium chloride  10 mEq Oral Daily   . povidone-iodine   Both Eyes Once   . Senna  17.2 mg Oral QHS   . traMADol  50 mg Oral Q6H SCH   . [START ON 09/15/2013] vancomycin  1,000 mg Intravenous On Call to OR   . [DISCONTINUED] ciprofloxacin  750 mg Oral Q12H SCH   . [DISCONTINUED] tiotropium  18 mcg Inhalation QAM       Review of Systems:   A comprehensive review of systems was: Negative except swelling and  shortness of breath with exertion.     Physical Exam:     Filed Vitals:    09/12/13 0809   BP: 146/65   Pulse: 81   Temp: 97.7 F (36.5 C)   Resp:    SpO2: 94%       Intake and Output Summary (Last 24 hours) at Date Time  No intake or output data in the 24 hours ending 09/12/13 1305    General appearance - alert, well appearing, and in no distress  Mental status - alert, oriented to person, place, and time  Neck - supple, no significant adenopathy  Chest - clear to auscultation, no wheezes, rales or rhonchi, symmetric air entry  Heart - normal rate, regular rhythm, normal S1, S2, no murmurs, rubs, clicks or gallops  Abdomen - soft, nontender, nondistended, no masses or organomegaly  Neurological - alert, oriented, normal speech, no focal findings or movement disorder noted  Extremities - pedal edema pedal edema 2+/4 on left LE +    Labs Reviewed:     Results     Procedure Component Value Units Date/Time    Prepare RBC: one unit RBC [[id number]] Collected:09/12/13 0643     RBC Leukoreduced [id number]    selected Updated:09/12/13 0731     RBC Leukoreduced [id number]    selected     Narrative:    ?Number of units->1  ?give total 2 unit  ?Special Requirements->No special requirements  ?Transfusion Criteria->Hgb <8g/dL or Hct <44% in pt with CAD,  ?unstable angina, MI,or cardiogenic shock  ?Is the patient pregnant?->No  ?Has the patient been transfused w/i the last 3 months?->No    Type and Screen: if there is no current type and screen in blood bank [id number] Collected:09/12/13 0643    Specimen Information:Blood Updated:09/12/13 0729     ABO Rh B POS      AB Screen Gel NEG     Narrative:    ?  Number of units->1  ? give total 2 unit  ?Special Requirements->No special requirements  ?Transfusion Criteria->Hgb <8g/dL or Hct <24% in pt with CAD,  ?unstable angina, MI,or cardiogenic shock  ?Is the patient pregnant?->No  ?Has the patient been transfused w/i the last 3 months?->No    Basic Metabolic Panel [[id number]]   (Abnormal) Collected:09/12/13 0423    Specimen Information:Blood Updated:09/12/13 0558     Glucose 104 (H) mg/dL      BUN 19 mg/dL      Creatinine 0.8 mg/dL      CALCIUM 8.4 mg/dL      Sodium 664 mEq/L      Potassium 3.8 mEq/L      Chloride 106 mEq/L      CO2 26 mEq/L     Magnesium [[id number]] Collected:09/12/13 0423    Specimen Information:Blood Updated:09/12/13 0558     Magnesium 1.9 mg/dL     GFR [[id number]] PIRJJOACZ:66/06/30 0423     EGFR >60.0 Updated:09/12/13 0558    B-type Natriuretic Peptide [[id number]]  (Abnormal) Collected:09/12/13 0423    Specimen Information:Blood Updated:09/12/13 0556     B-Natriuretic Peptide 525 (H) pg/mL             Rads:   Radiological Procedure reviewed.     Signed by: Pearletha Forge, MD

## 2013-09-12 NOTE — PT Progress Note (Signed)
Physical Therapy Treatment    Patient:  [name]        MRN#:  [id number]  Unit:  [place] [facility] 4C        Room/Bed:  M442/M442.01    Time of treatment: Start Time: 0900 Stop Time: 1000  Time Calculation (min): 60 min  PT Received On: 09/12/13    Treatment #: PT Visit Number: 6/7    Precautions  Weight Bearing Status: RLE WBAT    Medical Diagnosis:   Infection of total knee replacement, initial encounter [996.66, V43.65] (INFECTED RT KNEE)  Total knee replacement status [V43.65] (Total knee replacement status)  Surgical Procedure:Right   Procedure(s):  ARTHROPLASTY, KNEE, REVISION TOTAL performed by  Lane Hacker, MD on 09/05/2013.      Patient's medical condition is appropriate for Physical Therapy  intervention at this time.  Patient cleared by nurse to participate in PT session.    Subjective: I have not had a good day so far.  Patient is agreeable to participation in the therapy session.     Pain Assessment  Pain Assessment: Numeric Scale (0-10)  Pain Score: 5-moderate pain  Pain Location: Knee          Objective:  Patient is in bed seen 7 Days Post-Op with dressings and hinged brace locked into extension.                                       Sensation is intact                        Locomotion  Ambulation: Not performed (Patient was not feeling well.)    Therapeutic Exercise  Straight Leg Raise: 5aa  Quad Sets: 25  Glute Sets: 25  Hip Abduction: 25  Ankle Pumps: 25       Self Care and Home Management:  Pt did not perform self care activities at this time.    Educated the patient to role of physical therapy, plan of care, goals  of therapy and pursed lip breathing.    Patient is in bed with all needs provided and call bell within reach. RN notified of session outcome.    Assessment:   Assessment  Assessment: Decreased LE ROM;Decreased LE strength  Prognosis: Fair.           Goals per Eval/Re-eval:     Goals  Goal Formulation: With patient  Time for Goal Acheivement: 7 visits  Goals:  Select goal  Pt Will Go Supine To Sit: With stand by assist;Goal met  Pt Will Perform Sit to Stand: With stand by assist  Pt Will Transfer to Toilet: Independently  Pt Will Transfer to Tub/Shower: With minimal assist  PT Will Demonstrate Car Transfer Technique: With minimal assist  Pt Will Ambulate: 101-150 feet;With stand by assist;Partly met  Pt Will Go Up / Down Stairs: 3-5 stairs;With stand by assist  Pt Will Perform Home Exer Program: Independently  Pt Will Perform LE Dressing w/Device: With min assist          Plan:  Recommendation  Discharge Recommendation: Home with home health PT  DME Recommended for Discharge:  (pt has all required equipment)  PT - Next Visit Recommendation: 09/11/13  PT Frequency: 7x/wk    Continue plan of care.      Signature: Louann Liv, PTA 09/12/2013 9:37 AM

## 2013-09-12 NOTE — Progress Notes (Signed)
PROGRESS NOTE    Date Time: 09/12/2013 5:45 AM  Patient Name: [name], [name]      Assessment:    Right TKA Irrigation and Debridement and Poly Exchange 09/05/13    Plan:   Continued wound drainage, will transfuse 2U PRBC today and plan for resection arthroplasty and antibiotic spacer on Monday 09/15/13  Continue PT - WBAT in Extension with brace on, No bending knee  Continue anticoagulation - ASA 325 bid  ID consulted - Pan-Sensitive Proteus ssp Final Recommendations Cipro 750 bid for 6 weeks  Wound vac removed yesterday, dressing changed this am, continued active drainage  Will consent patient on Sunday 09/14/13  D/c planning    Subjective:   Pain controlled, tolerating diet, participating in PT    Medications:     Current Facility-Administered Medications   Medication Dose Route Frequency   . albuterol-ipratropium  3 mL Nebulization Q4H WA   . amLODIPine  5 mg Oral Daily   . aspirin EC  325 mg Oral Daily   . celecoxib  200 mg Oral Daily   . ciprofloxacin  750 mg Oral Q12H SCH   . cloNIDine  0.1 mg Oral Daily   . docusate sodium  100 mg Oral Q12H SCH   . ferrous sulfate 324 mg-ascorbic acid 500 mg combo dose   Oral Daily   . [COMPLETED] furosemide  20 mg Intravenous Once   . gabapentin  300 mg Oral BID   . hydrochlorothiazide  25 mg Oral Daily   . nebivolol  5 mg Oral QHS   . polycarbophil  1,250 mg Oral Daily   . polyethylene glycol  17 g Oral Daily   . potassium chloride  10 mEq Oral Daily   . povidone-iodine   Both Eyes Once   . Senna  17.2 mg Oral QHS   . traMADol  50 mg Oral Q6H SCH   . [DISCONTINUED] tiotropium  18 mcg Inhalation QAM       Physical Exam:     Filed Vitals:    09/11/13 1937   BP: 169/72   Pulse: 73   Temp: 96.6 F (35.9 C)   Resp: 18   SpO2: 94%       Intake and Output Summary (Last 24 hours) at Date Time    Intake/Output Summary (Last 24 hours) at 09/12/13 0545  Last data filed at 09/11/13 0600   Gross per 24 hour   Intake    360 ml   Output      0 ml   Net    360 ml       General appearance -  alert, well appearing, and in no distress  Musculoskeletal - Right knee  Dresing , drain serosanguinous d/c 09/07/13, area of drainage inferiorly increased from previous days  Fires quad/TA/EHL/GSC  SILT SP/DP/TN  WWP distally  Calves soft, nontender bilateral    Physical Therapy:   Pain Score: 5-moderate pain (09/11/13 2129)  Weight Bearing Percent: 100  (09/11/13 1400)  Ambulation Distance (Feet): 300 Feet (150x2) (09/11/13 1400)  Right Lower Extremity ROM:  (knee brace locked in extension) (09/07/13 0915)  Left Lower Extremity ROM: within functional limits (09/07/13 0915)         Knee AROM : no knee flexion (09/07/13 0915)    Labs:     Results     Procedure Component Value Units Date/Time    B-type Natriuretic Peptide [[id number]] Collected:09/12/13 0423    Specimen Information:Blood Updated:09/12/13 0523    Basic Metabolic  Panel [[id number]] Collected:09/12/13 0423    Specimen Information:Blood Updated:09/12/13 0523    Magnesium [[id number]] Collected:09/12/13 0423    Specimen Information:Blood Updated:09/12/13 0523    B-type Natriuretic Peptide [[id number]]  (Abnormal) Collected:09/11/13 1139    Specimen Information:Blood Updated:09/11/13 1210     B-Natriuretic Peptide 640 (H) pg/mL     Narrative:    ?Per RN, Ela, patient off unit having a chest x-ray.  She to   ?call when patient returns.   09/11/2013  10:58 Millie  Per RN, Ela, patient off unit having a chest x-ray.  She to   call when patient returns.  09/11/2013  10:58 Millie    Basic Metabolic Panel [[id number]] Collected:09/11/13 1139    Specimen Information:Blood Updated:09/11/13 1207     Glucose 100 mg/dL      BUN 21 mg/dL      Creatinine 0.8 mg/dL      CALCIUM 8.6 mg/dL      Sodium 259 mEq/L      Potassium 4.0 mEq/L      Chloride 102 mEq/L      CO2 25 mEq/L     Narrative:    ?Per RN, Ela, patient off unit having a chest x-ray.  She to   ?call when patient returns.   09/11/2013  10:58 Millie  Per RN, Ela, patient off unit having a chest x-ray.  She to   call  when patient returns.  09/11/2013  10:58 Millie    Magnesium [[id number]] Collected:09/11/13 1139    Specimen Information:Blood Updated:09/11/13 1207     Magnesium 1.9 mg/dL     Narrative:    ?Per RN, Ela, patient off unit having a chest x-ray.  She to   ?call when patient returns.   09/11/2013  10:58 Millie  Per RN, Ela, patient off unit having a chest x-ray.  She to   call when patient returns.  09/11/2013  10:58 Millie    GFR [[id number]] Collected:09/11/13 1139     EGFR >60.0 Updated:09/11/13 1207    Narrative:    ?Per RN, Ela, patient off unit having a chest x-ray.  She to   ?call when patient returns.   09/11/2013  10:58 Millie  Per RN, Ela, patient off unit having a chest x-ray.  She to   call when patient returns.  09/11/2013  10:58 Millie          Recent CBC No results found for this basename: WHITEBLOODCE,RBC,HGB,HCT,,LABPLAT, NRBCA, REFLX, ANRBA in the last 24 hours  Recent BMP   Recent Labs   Basename 09/11/13 1139    GLU 100    BUN 21    CREAT 0.8    CA 8.6    NA 142    K 4.0    CL 102    CO2 25     Recent PT/INR No results found for this basename: PTI,INR,COUM,COUMP in the last 24 hours    Rads:   Radiological Procedure reviewed.    Signed by: Garey Ham

## 2013-09-12 NOTE — Plan of Care (Signed)
Pt able to have BM after dulcolax suppository.

## 2013-09-12 NOTE — Progress Notes (Signed)
Dulcolax suppository applied.

## 2013-09-12 NOTE — Plan of Care (Signed)
Problem: Pain  Goal: Patient's pain/discomfort is manageable  Outcome: Progressing  Pain is controlled with current pain medication.

## 2013-09-13 LAB — PREPARE RBC
RBC Leukoreduced: TRANSFUSED
RBC Leukoreduced: TRANSFUSED

## 2013-09-13 NOTE — Progress Notes (Signed)
PROGRESS NOTE    Date Time: 09/13/2013 8:31 AM  Patient Name: [name], [name]      Assessment:    Right TKA Irrigation and Debridement and Poly Exchange 09/05/13    Plan:   Wound continues to drain,plan for resection arthroplasty and antibiotic spacer on Monday 09/15/13  Continue PT - WBAT in Extension with brace on, No bending knee  Continue anticoagulation - ASA 325 bid  ID consulted - Pan-Sensitive Proteus ssp Final Recommendations Cipro 750 bid for 6 weeks  Transfused 12/5, will recheck H/H  Will consent patient on Sunday 09/14/13  D/c planning    Subjective:   Pain controlled, tolerating diet, no fevers, drainage continues.     Medications:     Current Facility-Administered Medications   Medication Dose Route Frequency   . albuterol-ipratropium  3 mL Nebulization Q4H WA   . amLODIPine  5 mg Oral Daily   . aspirin EC  325 mg Oral Daily   . [COMPLETED] bisacodyl  10 mg Rectal Once   . ceFAZolin  2 g Intravenous Q8H SCH   . celecoxib  200 mg Oral Daily   . cloNIDine  0.1 mg Oral Daily   . docusate sodium  100 mg Oral Q12H SCH   . ferrous sulfate 324 mg-ascorbic acid 500 mg combo dose   Oral Daily   . furosemide  20 mg Intravenous Once   . gabapentin  300 mg Oral BID   . hydrochlorothiazide  25 mg Oral Daily   . nebivolol  5 mg Oral QHS   . polycarbophil  1,250 mg Oral Daily   . polyethylene glycol  17 g Oral Daily   . potassium chloride  10 mEq Oral Daily   . povidone-iodine   Both Eyes Once   . Senna  17.2 mg Oral QHS   . traMADol  50 mg Oral Q6H SCH   . [START ON 09/15/2013] vancomycin  1,000 mg Intravenous On Call to OR   . [DISCONTINUED] ciprofloxacin  750 mg Oral Q12H [facility]       Physical Exam:     Filed Vitals:    09/13/13 0820   BP: 147/61   Pulse: 71   Temp:    Resp: 18   SpO2: 97%       Intake and Output Summary (Last 24 hours) at Date Time  No intake or output data in the 24 hours ending 09/13/13 0831    General appearance - alert, well appearing, and in no distress  Musculoskeletal - Right knee  Dresing ,  drain serosanguinous d/c 09/07/13, area of drainage inferiorly increased from previous days  Fires quad/TA/EHL/GSC  SILT SP/DP/TN  WWP distally  Calves soft, nontender bilateral    Physical Therapy:   Pain Score: 5-moderate pain (09/13/13 0820)  Weight Bearing Percent: 100  (09/11/13 1400)  Ambulation Distance (Feet): 300 Feet (150x2) (09/11/13 1400)  Right Lower Extremity ROM:  (knee brace locked in extension) (09/07/13 0915)  Left Lower Extremity ROM: within functional limits (09/07/13 0915)         Knee AROM : no knee flexion (09/07/13 0915)    Labs:         Recent CBC No results found for this basename: WHITEBLOODCE,RBC,HGB,HCT,,LABPLAT, NRBCA, REFLX, ANRBA in the last 24 hours  Recent BMP   No results found for this basename: GLU,BUN,CREAT,CA,NA,K,CL,CO2,AGAP in the last 24 hours  Recent PT/INR No results found for this basename: PTI,INR,COUM,COUMP in the last 24 hours    Rads:  Radiological Procedure reviewed.    Signed by: Marrian Salvage

## 2013-09-13 NOTE — Progress Notes (Signed)
[facility]IC[facility] PROGRESS NOTE    Date Time: 09/13/2013 7:44 AM  Patient Name: [name],[name]        Subjective:   "I'm feeling OK. I am getting some blood transfused today."    Medications:      Scheduled Meds: PRN Meds:           albuterol-ipratropium 3 mL Nebulization Q4H WA   amLODIPine 5 mg Oral Daily   aspirin EC 325 mg Oral Daily   [COMPLETED] bisacodyl 10 mg Rectal Once   ceFAZolin 2 g Intravenous Q8H SCH   celecoxib 200 mg Oral Daily   cloNIDine 0.1 mg Oral Daily   docusate sodium 100 mg Oral Q12H SCH   ferrous sulfate 324 mg-ascorbic acid 500 mg combo dose  Oral Daily   furosemide 20 mg Intravenous Once   gabapentin 300 mg Oral BID   hydrochlorothiazide 25 mg Oral Daily   nebivolol 5 mg Oral QHS   polycarbophil 1,250 mg Oral Daily   polyethylene glycol 17 g Oral Daily   potassium chloride 10 mEq Oral Daily   povidone-iodine  Both Eyes Once   Senna 17.2 mg Oral QHS   traMADol 50 mg Oral Q6H SCH   [START ON 09/15/2013] vancomycin 1,000 mg Intravenous On Call to OR   [DISCONTINUED] ciprofloxacin 750 mg Oral Q12H SCH         Continuous Infusions:         albuterol-ipratropium 3 mL Q4H PRN   bisacodyl 10 mg QD PRN   HYDROcodone-acetaminophen 1 tablet Q4H PRN   HYDROcodone-acetaminophen 2 tablet Q6H PRN   HYDROcodone-acetaminophen 2 tablet Q4H PRN   HYDROmorphone 0.5 mg Q1H PRN   hydrOXYzine 25 mg Q12H PRN   ondansetron 4 mg Q8H PRN   ondansetron 4 mg Q8H PRN   senna-docusate 2 tablet BID PRN   zolpidem 5 mg QHS PRN         I personally reviewed all of the medications      Review of Systems:   A comprehensive review of systems was obtained from chart review and the patient  General ROS: negative  Ophthalmic ROS: negative  Endocrine ROS: negative  Respiratory ROS: no cough, shortness of breath, or wheezing  Cardiovascular ROS: no chest pain or some dyspnea on exertion  Gastrointestinal ROS: no abdominal pain, last BM 12/5, +flatus, no n/v  Genito-Urinary ROS: no c/o dysuria, trouble voiding, or  hematuria  Musculoskeletal ROS: right knee pain- managed with PO pain meds  Neurological ROS: no TIA or stroke symptoms, denies dizziness      Physical Exam:     Filed Vitals:    09/12/13 0650 09/12/13 0809 09/12/13 1604 09/12/13 2013   BP: 143/66 146/65 112/55 113/51   Pulse: 80 81 66 71   Temp: 98.2 F (36.8 C) 97.7 F (36.5 C) 97.3 F (36.3 C) 96.3 F (35.7 C)   TempSrc: Oral      Resp: 18  20 18    Height:       Weight:       SpO2: 97% 94% 99% 96%         Intake and Output Summary (Last 24 hours) at Date Time  No intake or output data in the 24 hours ending 09/13/13 0744    General appearance - alert, well appearing, and in no distress  Mental status - alert, oriented to person, place, and time  Chest - clear to auscultation, no wheezes, rales or rhonchi, symmetric air entry  Heart - normal rate, regular rhythm, normal S1, S2, no murmurs  Abdomen - bowel sounds normal, abdomen is soft, nontender, non distended, obese  Neurological - alert, oriented, normal speech, no focal findings or movement disorder noted  Musculoskeletal - laying in bed  Extremities - peripheral pulses normal, no pedal edema, no clubbing or cyanosis  RLE w/ brace  Consultant note reviewed.    Labs:       Lab 09/12/13 0423 09/11/13 1139 09/09/13 0435 09/08/13 0454   GLU 104* 100 -- 119*   BUN 19 21 -- 17   CREAT 0.8 0.8 -- 0.8   CA 8.4 8.6 -- 8.2   NA 143 142 -- 141   K 3.8 4.0 3.7 --   CL 106 102 -- 105   CO2 26 25 -- 25   ALB -- -- -- --   PHOS -- -- -- --   MG 1.9 1.9 -- --   EGFR >60.0 >60.0 -- >60.0   PT -- -- -- --   PTT -- -- -- --   INR -- -- -- --       No results found for this basename: AST:3,ALT:3,ALKPHOS:3,ALB:3,BILITOTAL:3,AMY:3,LIP:3 in the last 168 hours      Lab 09/09/13 0436 09/08/13 0454 09/07/13 0421   WBC -- -- --   HGB 7.8* 8.2* 8.2*   HCT 24.7* 26.7* 26.8*   MCV -- -- --   MCH -- -- --   MCHC -- -- --   RDW -- -- --   MPV -- -- --       No results found for this basename: TSH,FREET3,FREET4 in the last 168  hours        Rads:     Radiology Results (24 Hour)     ** No Results found for the last 24 hours. **                  Assessment and Plan:      Patient Active Problem List   Diagnosis   . S/p R TKA, readmitted 09/05/13. S/p R TKA I&D and Poly Exchange- Continue plan per ID and Ortho - scheduled for R knee revision 12/8   . Low back pain - controlled   . Chronic obstructive pulmonary disease- lungs CTA, O2 sat stable on RA. Received lasix with BNP slight improvement. Recheck BNP tomorrow AM. Continue duonebs ATC.   Marland Kitchen Hypertension-  BP stable   Post-operative Acute Anemia, secondary to blood loss, s/p surgery. Will add Vitron C. Pt to receive 2 units PRBC per ortho with lasix between units  Constipation- continue current meds.    Signed by: Lucretia Kern, NP

## 2013-09-13 NOTE — Plan of Care (Signed)
Problem: Safety  Goal: Patient will be free from injury during hospitalization  Outcome: Progressing  Hourly rounding done, call bell within reach.    Problem: Pain  Goal: Patient's pain/discomfort is manageable  Outcome: Progressing  Pain is manageable with current pain medication.

## 2013-09-13 NOTE — PT Progress Note (Signed)
Physical Therapy Cancellation Note    Patient: [name]  WUJ:[id number]    Unit: G956/O130.86    Patient not seen for physical therapy secondary to pt receiving blood transfusion, will look to follow up tomorrow.

## 2013-09-13 NOTE — PT Progress Note (Signed)
PT attempted, however, pt unable to participate 2* pt receiving blood transfusion followed by antibiotics.  Will see tomorrow.

## 2013-09-14 LAB — HEMOGLOBIN AND HEMATOCRIT, BLOOD
Hematocrit: 30.7 % — ABNORMAL LOW (ref 37.0–47.0)
Hgb: 10.1 g/dL — ABNORMAL LOW (ref 12.0–16.0)

## 2013-09-14 LAB — BASIC METABOLIC PANEL
BUN: 13 mg/dL (ref 7–21)
CO2: 28 mEq/L (ref 22–29)
Calcium: 8.7 mg/dL (ref 7.9–10.6)
Chloride: 103 mEq/L (ref 98–107)
Creatinine: 0.8 mg/dL (ref 0.6–1.0)
Glucose: 94 mg/dL (ref 70–100)
Potassium: 4 mEq/L (ref 3.5–5.1)
Sodium: 142 mEq/L (ref 136–145)

## 2013-09-14 LAB — PT/INR
PT INR: 1.1 (ref 0.9–1.1)
PT: 14.4 (ref 12.6–15.0)

## 2013-09-14 LAB — B-TYPE NATRIURETIC PEPTIDE: B-Natriuretic Peptide: 509 pg/mL — ABNORMAL HIGH (ref 0–100)

## 2013-09-14 LAB — GFR: EGFR: >60

## 2013-09-14 NOTE — Progress Notes (Signed)
PROGRESS NOTE    Date Time: 09/14/2013 7:53 AM  Patient Name: [name], [name]      Assessment:    Right TKA Irrigation and Debridement and Poly Exchange 09/05/13    Plan:   + Wound drainage, plan for resection arthroplasty and antibiotic spacer on Monday 09/15/13  PT - WBAT in Extension with brace on, No bending knee  DVT: ASA 325 bid  ID: Pan-Sensitive Proteus ssp Final Recommendations Cipro 750 bid for 6 weeks  2U pRBC 12/6;H/H with appropriate response  Will consent patient on Sunday 09/14/13  D/c planning pending outcome of explantation    Subjective:   Feels well, pain controlled, no current complaints.    Medications:     Current Facility-Administered Medications   Medication Dose Route Frequency   . albuterol-ipratropium  3 mL Nebulization Q4H WA   . amLODIPine  5 mg Oral Daily   . aspirin EC  325 mg Oral Daily   . ceFAZolin  2 g Intravenous Q8H SCH   . celecoxib  200 mg Oral Daily   . cloNIDine  0.1 mg Oral Daily   . docusate sodium  100 mg Oral Q12H SCH   . ferrous sulfate 324 mg-ascorbic acid 500 mg combo dose   Oral Daily   . [COMPLETED] furosemide  20 mg Intravenous Once   . gabapentin  300 mg Oral BID   . hydrochlorothiazide  25 mg Oral Daily   . nebivolol  5 mg Oral QHS   . polycarbophil  1,250 mg Oral Daily   . polyethylene glycol  17 g Oral Daily   . potassium chloride  10 mEq Oral Daily   . povidone-iodine   Both Eyes Once   . Senna  17.2 mg Oral QHS   . traMADol  50 mg Oral Q6H SCH   . [START ON 09/15/2013] vancomycin  1,000 mg Intravenous On Call to OR       Physical Exam:     Filed Vitals:    09/14/13 0621   BP:    Pulse: 70   Temp: 97.7 F (36.5 C)   Resp: 19   SpO2: 98%       Intake and Output Summary (Last 24 hours) at Date Time    Intake/Output Summary (Last 24 hours) at 09/14/13 0753  Last data filed at 09/13/13 1349   Gross per 24 hour   Intake      0 ml   Output    500 ml   Net   -500 ml       General appearance - alert, well appearing, and in no distress  Musculoskeletal - Right  knee  Dressing with SS drainage along distal 1/2. Wound well approximated without breakdown.  Fires quad/TA/EHL/GSC  SILT SP/DP/TN  WWP distally  Calves soft, nontender bilateral    Physical Therapy:   Pain Score: 2-mild pain (09/14/13 1610)  Weight Bearing Percent: 100  (09/11/13 1400)  Ambulation Distance (Feet): 300 Feet (150x2) (09/11/13 1400)  Right Lower Extremity ROM:  (knee brace locked in extension) (09/07/13 0915)  Left Lower Extremity ROM: within functional limits (09/07/13 0915)         Knee AROM : no knee flexion (09/07/13 0915)    Labs:         Recent CBC   Recent Labs   Basename 09/14/13 0416    RBC --    HGB 10.1*    HCT 30.7*    LABPLAT --     Recent BMP  Recent Labs   [facility] 09/14/13 0416    GLU 94    BUN 13    CREAT 0.8    CA 8.7    NA 142    K 4.0    CL 103    CO2 28     Recent PT/INR   Recent Labs   Basename 09/14/13 0416    INR 1.1       Rads:   Radiological Procedure reviewed.    Signed by: Marrian Salvage

## 2013-09-14 NOTE — Progress Notes (Signed)
[facility] PROGRESS NOTE    Date Time: 09/14/2013 7:28 AM  Patient Name: [name],[name]        Subjective:   "I'm feeling good today."    Medications:      Scheduled Meds: PRN Meds:           albuterol-ipratropium 3 mL Nebulization Q4H WA   amLODIPine 5 mg Oral Daily   aspirin EC 325 mg Oral Daily   ceFAZolin 2 g Intravenous Q8H SCH   celecoxib 200 mg Oral Daily   cloNIDine 0.1 mg Oral Daily   docusate sodium 100 mg Oral Q12H SCH   ferrous sulfate 324 mg-ascorbic acid 500 mg combo dose  Oral Daily   [COMPLETED] furosemide 20 mg Intravenous Once   gabapentin 300 mg Oral BID   hydrochlorothiazide 25 mg Oral Daily   nebivolol 5 mg Oral QHS   polycarbophil 1,250 mg Oral Daily   polyethylene glycol 17 g Oral Daily   potassium chloride 10 mEq Oral Daily   povidone-iodine  Both Eyes Once   Senna 17.2 mg Oral QHS   traMADol 50 mg Oral Q6H SCH   [START ON 09/15/2013] vancomycin 1,000 mg Intravenous On Call to OR         Continuous Infusions:         albuterol-ipratropium 3 mL Q4H PRN   bisacodyl 10 mg QD PRN   HYDROcodone-acetaminophen 1 tablet Q4H PRN   HYDROcodone-acetaminophen 2 tablet Q6H PRN   HYDROcodone-acetaminophen 2 tablet Q4H PRN   HYDROmorphone 0.5 mg Q1H PRN   hydrOXYzine 25 mg Q12H PRN   ondansetron 4 mg Q8H PRN   ondansetron 4 mg Q8H PRN   senna-docusate 2 tablet BID PRN   zolpidem 5 mg QHS PRN         I personally reviewed all of the medications      Review of Systems:   A comprehensive review of systems was obtained from chart review and the patient  General ROS: negative  Ophthalmic ROS: negative  Endocrine ROS: negative  Respiratory ROS: no cough, shortness of breath, or wheezing  Cardiovascular ROS: no chest pain or some dyspnea on exertion  Gastrointestinal ROS: no abdominal pain, +flatus, no n/v  Genito-Urinary ROS: no c/o dysuria, trouble voiding, or hematuria  Musculoskeletal ROS: right knee pain- managed with PO pain meds  Neurological ROS: no TIA or stroke symptoms, denies  dizziness      Physical Exam:     Filed Vitals:    09/13/13 0952 09/13/13 1502 09/13/13 1953 09/14/13 0621   BP:  143/61 135/58    Pulse:  67 65 70   Temp: 96.1 F (35.6 C) 96.6 F (35.9 C) 97.1 F (36.2 C) 97.7 F (36.5 C)   TempSrc:       Resp:  18 18 19    Height:       Weight:       SpO2:   95% 98%         Intake and Output Summary (Last 24 hours) at Date Time    Intake/Output Summary (Last 24 hours) at 09/14/13 1610  Last data filed at 09/13/13 1349   Gross per 24 hour   Intake      0 ml   Output    500 ml   Net   -500 ml       General appearance - alert, well appearing, and in no distress  Mental status - alert, oriented to person, place, and time  Chest - clear to auscultation, no wheezes, rales or rhonchi, symmetric air entry  Heart - normal rate, regular rhythm, normal S1, S2, no murmurs  Abdomen - bowel sounds normal, abdomen is soft, nontender, non distended, obese  Neurological - alert, oriented, normal speech, no focal findings or movement disorder noted  Musculoskeletal - sitting up in [facility]  Extremities - peripheral pulses normal, no clubbing or cyanosis  RLE w/ brace; trace edema to BLE  Consultant note reviewed.    Labs:       Lab 09/14/13 0416 09/12/13 0423 09/11/13 1139   GLU 94 104* 100   BUN 13 19 21    CREAT 0.8 0.8 0.8   CA 8.7 8.4 8.6   NA 142 143 142   K 4.0 3.8 4.0   CL 103 106 102   CO2 28 26 25    ALB -- -- --   PHOS -- -- --   MG -- 1.9 1.9   EGFR >60.0 >60.0 >60.0   PT 14.4 -- --   PTT -- -- --   INR 1.1 -- --       No results found for this basename: AST:3,ALT:3,ALKPHOS:3,ALB:3,BILITOTAL:3,AMY:3,LIP:3 in the last 168 hours      Lab 09/14/13 0416 09/09/13 0436 09/08/13 0454   WBC -- -- --   HGB 10.1* 7.8* 8.2*   HCT 30.7* 24.7* 26.7*   MCV -- -- --   MCH -- -- --   MCHC -- -- --   RDW -- -- --   MPV -- -- --       No results found for this basename: TSH,FREET3,FREET4 in the last 168 hours        Rads:     Radiology Results (24 Hour)     ** No Results found for the last 24 hours. **                   Assessment and Plan:      Patient Active Problem List   Diagnosis   . S/p R TKA, readmitted 09/05/13. S/p R TKA I&D and Poly Exchange- Continue plan per ID and Ortho - scheduled for R knee revision 12/8; NPO after MN   . Low back pain - controlled   . Chronic obstructive pulmonary disease- lungs CTA, O2 sat stable on RA. Continue duonebs ATC. Reviewed incentive spirometry use with patient and she was able to give return demonstration.    . Hypertension-  BP stable   Post-operative Acute Anemia, secondary to blood loss, s/p surgery. Pt received 2 units PRBC yesterday with improvement in H&H today. Continue to monitor.   Elevated BUN - improved from initial dose of lasix, she also received lasix yesterday with her blood transfusions. Pt is asymtomatic, no wheezing or SOB. CXR from 09/11/13 with no significant findings of cardiopulmonary process.   Constipation- continue current meds.    Signed by: Lucretia Kern, NP

## 2013-09-14 NOTE — Plan of Care (Signed)
Problem: Potential for Compromised Skin Integrity  Goal: Skin integrity is maintained or improved  Assess and monitor skin integrity. Identify patients at risk for skin breakdown on admission and per policy. Collaborate with interdisciplinary team and initiate plans and interventions as needed.   Outcome: Progressing  Skin is intact, clean and dry, right knee incision covered with ace wrap.    Problem: Day 3 Post-op- Knee Surgery  Goal: Free from Infection  Outcome: Progressing  On continuous ABX therapy for knee infection, s/p I & D, no fever noted.  Goal: Mobility/activity is maintained at optimum level for patient  Outcome: Progressing  Pt. Has been using walker to ambulate, has been participating in PT. Knee immobilizer in place.

## 2013-09-14 NOTE — Plan of Care (Signed)
Problem: Safety  Goal: Patient will be free from injury during hospitalization  Outcome: Progressing  Hourly rounding done, appropriate safety protocols maintained, environment clean. Daughter at bedside, call be within reach.

## 2013-09-14 NOTE — PT Progress Note (Signed)
[facility]  [address]  [place] [address]  [phone]    Physical Therapy Treatment    Patient:  [name]        MRN#:  [id number]  Unit:   [facility]        Room/Bed:  M442/M442.01    Time of treatment:  PT Received On: 09/14/13  Start Time: 1230 Stop Time: 1300  Time Calculation (min): 30 min    Treatment #: PT Visit Number: 7    Precautions  Weight Bearing Status: RLE WBAT  Total Knee Replacement: hinge brace;at all times    Patient's medical condition is appropriate for Physical Therapy intervention at this time.    Subjective: feeling a little tired but is willing to participate with physical therapy  Patient is agreeable to participation in the therapy session.                Objective:  Observation of Patient:  Patient is in bed with No Medical Equipment in place.              Functional Mobility  Supine to Sit: Stand by assistance  Scooting: Stand by assistance  Sit to Supine: Stand by assistance  Sit to Stand: Stand by assistance  Stand to Sit: Stand by assistance              Locomotion  Ambulation: modified independent (device);with front-wheeled walker  Ambulation Distance (Feet): 80 Feet  Pattern: decreased step length;decreased cadence    Therapeutic Exercise  Quad Sets: 50  Glute Sets: 50  Ankle Pumps: 50           Vitals:     Filed Vitals:    09/13/13 0952 09/13/13 1502 09/13/13 1953 09/14/13 0621   BP:  143/61 135/58    Pulse:  67 65 70   Temp: 96.1 F (35.6 C) 96.6 F (35.9 C) 97.1 F (36.2 C) 97.7 F (36.5 C)   TempSrc:       Resp:  18 18 19    Height:       Weight:       SpO2:   95% 98%            Educated the patient to role of physical therapy, plan of care, goals  of therapy and HEP.    Patient left in bed with all needs met, equipment intact and call bell within reach. RN notified of session outcome.    Assessment:  Assessment  Assessment: Decreased LE strength;Decreased LE ROM;Decreased endurance/activity tolerance;Gait  impairment  Prognosis: Fair    Progress: Progressing toward goals    Risks/Benefits/POC Discussed with Pt/Family: With patient;With patient/family    Goals per Eval/ Re-eval:   Goals  Goal Formulation: With patient  Time for Goal Acheivement: 7 visits  Goals: Select goal  Pt Will Go Supine To Sit: With stand by assist;Goal met  Pt Will Perform Sit to Stand: With stand by assist  Pt Will Transfer to Toilet: Independently  Pt Will Transfer to Tub/Shower: With minimal assist  PT Will Demonstrate Car Transfer Technique: With minimal assist  Pt Will Ambulate: 101-150 feet;With stand by assist;Partly met  Pt Will Go Up / Down Stairs: 3-5 stairs;With stand by assist  Pt Will Perform Home Exer Program: Independently  Pt Will Perform LE Dressing w/Device: With min assist    Recommendation  Discharge Recommendation: Home with home health PT;Home with outpatient PT  DME Recommended for Discharge:  (  pt has all required equipment)  PT - Next Visit Recommendation: 09/15/13  PT Frequency: 7x/wk      Plan:    Plan  Risks/Benefits/POC Discussed with Pt/Family: With patient;With patient/family  Treatment/Interventions: Exercise;Gait training;Endurance training  Progress: Progressing toward goals  PT Frequency: 7x/wk    Continue plan of care.    Signature:  Marcelyn Bruins, PTA  09/14/2013 1:16 PM   Phone:

## 2013-09-14 NOTE — Progress Notes (Signed)
INFECTIOUS DISEASES PROGRESS NOTE    Date Time: 09/14/2013 12:29 PM  Patient Name: [name]      Assessment and Recommendations:     Patient Active Problem List   Diagnosis   . Total knee replacement status   . Low back pain   . Chronic obstructive pulmonary disease   . 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    Plans for resection arthroplasty/abx spacer noted 12/8  abx changed to ancef to provide appropriate coverage  For proteus as well as surgical prophylaxis.  A dose of vancomycin ordered  by Dr Rebecca Eaton on call to or 12/8 as well        Subjective:   Plans for resection/spacer noted 12/8    Antibiotics:   Ancef  vanco x 1 ordered on call 12/8  Prior cipro     S/p I and D poly xc 11/28    Medications:     Current Facility-Administered Medications   Medication Dose Route Frequency   . albuterol-ipratropium  3 mL Nebulization Q4H WA   . amLODIPine  5 mg Oral Daily   . aspirin EC  325 mg Oral Daily   . ceFAZolin  2 g Intravenous Q8H SCH   . celecoxib  200 mg Oral Daily   . cloNIDine  0.1 mg Oral Daily   . docusate sodium  100 mg Oral Q12H SCH   . ferrous sulfate 324 mg-ascorbic acid 500 mg combo dose   Oral Daily   . [COMPLETED] furosemide  20 mg Intravenous Once   . gabapentin  300 mg Oral BID   . hydrochlorothiazide  25 mg Oral Daily   . nebivolol  5 mg Oral QHS   . polycarbophil  1,250 mg Oral Daily   . polyethylene glycol  17 g Oral Daily   . potassium chloride  10 mEq Oral Daily   . povidone-iodine   Both Eyes Once   . Senna  17.2 mg Oral QHS   . traMADol  50 mg Oral Q6H SCH   . [START ON 09/15/2013] vancomycin  1,000 mg Intravenous On Call to OR       Central Access/Endovascular Hardware:     Active PICC Line / CVC Line / PIV Line / Drain / Airway / Intraosseous Line / Epidural Line / ART Line / Line / Wound / Pressure Ulcer / NG/OG Tube     Name   Placement date   Placement  time   Site   Days    OnQ Pump (Incisional) 08/25/13 knee Right  08/25/13   0939   knee   20    Peripheral IV 09/13/13 Left Hand  09/13/13   1230   Hand   less than 1    Closed/Suction Drain Right Knee 10 Fr.  09/05/13   1401   Knee   8    Incision Site 09/05/13 Knee Right  09/05/13   1437     8          Review of Systems:   Patient is without fevers, chills, nausea, vomiting, diarrhea, abdominal pain, cough, dyspnea, headache, rash.  In good spirits    Physical Exam:     Filed Vitals:    09/13/13 0952 09/13/13 1502 09/13/13 1953 09/14/13 0621   BP:  143/61 135/58    Pulse:  67 65 70   Temp: 96.1 F (35.6 C) 96.6 F (35.9 C) 97.1 F (36.2 C) 97.7 F (36.5 C)   TempSrc:       Resp:  18 18 19    Height:       Weight:       SpO2:   95% 98%       Temp (24hrs), Avg:97.1 F (36.2 C), Min:96.6 F (35.9 C), Max:97.7 F (36.5 C)      In no apparent distress  Sclera anicteric, conjunctivae clear  Oral pharynx s erythema, exudate, or other significant lesions  No Thrush  Lungs Clear  Heart Regular rate and rhythm s murmurs, rubs, or gallops  Abdomen Soft, non tender, non distended, normal bowel sounds  Extremities right lower ext with mild -mod edema,  Surgical dressing cdi  Skin without rash     Labs:         Lab 09/14/13 0416   WBC --   HGB 10.1*   HCT 30.7*   PLT --        Lab 09/14/13 0416   NA 142   K 4.0   CL 103   CO2 28   BUN 13   CREAT 0.8   CA 8.7   ALB --   PROT --   BILITOTAL --   ALKPHOS --   ALT --   AST --   GLU 94       Lab 09/14/13 0416   INR 1.1   APTT --          Rads:     Radiology Results (24 Hour)     ** No Results found for the last 24 hours. **          Signed by: Ray Church, MD

## 2013-09-15 ENCOUNTER — Encounter: Payer: Self-pay | Admitting: Pain Medicine

## 2013-09-15 ENCOUNTER — Inpatient Hospital Stay: Payer: No Typology Code available for payment source | Admitting: Pain Medicine

## 2013-09-15 ENCOUNTER — Encounter
Admission: AD | Disposition: A | Discharge status: Rehabilitation Facility | Payer: Self-pay | Source: Ambulatory Visit | Attending: Orthopaedic Surgery

## 2013-09-15 ENCOUNTER — Inpatient Hospital Stay: Payer: No Typology Code available for payment source

## 2013-09-15 HISTORY — PX: ARTHROPLASTY, KNEE, RESECTION: SHX3130

## 2013-09-15 LAB — CBC
Hematocrit: 34.6 % — ABNORMAL LOW (ref 37.0–47.0)
Hgb: 11.2 g/dL — ABNORMAL LOW (ref 12.0–16.0)
MCH: 27 pg — ABNORMAL LOW (ref 28.0–32.0)
MCHC: 32.4 g/dL (ref 32.0–36.0)
MCV: 83.4 fL (ref 80.0–100.0)
MPV: 9.5 fL (ref 9.4–12.3)
Nucleated RBC: 0 (ref 0–1)
Platelets: 301 (ref 140–400)
RBC: 4.15 — ABNORMAL LOW (ref 4.20–5.40)
RDW: 15 % (ref 12–15)
WBC: 14.26 — ABNORMAL HIGH (ref 3.50–10.80)

## 2013-09-15 LAB — BODY FLUID CELL COUNT
Body Fluid Lymphocytes: 2 %
Body Fluid Monocyte/Macrophage Cells: 4 %
Body Fluid Polymorphonuclear Cell: 94 %
Body Fluid RBC: 96000 /mm3
Body Fluid WBC: 31488 /mm3

## 2013-09-15 SURGERY — ARTHROPLASTY, KNEE, RESECTION
Anesthesia: Regional | Site: Knee | Laterality: Right | Wound class: Dirty or Infected

## 2013-09-15 MED ORDER — HYDROMORPHONE HCL PF 1 MG/ML IJ SOLN
INTRAMUSCULAR | Status: AC
Start: 2013-09-15 — End: 2013-09-15
  Administered 2013-09-15: 0.5 mg via INTRAVENOUS
  Filled 2013-09-15: qty 1

## 2013-09-15 MED ORDER — TOBRAMYCIN SULFATE 1.2 G IJ SOLR
INTRAMUSCULAR | Status: DC | PRN
Start: 2013-09-15 — End: 2013-09-15
  Administered 2013-09-15: 10800 mg

## 2013-09-15 MED ORDER — METOCLOPRAMIDE HCL 5 MG/ML IJ SOLN
10.0000 mg | Freq: Once | INTRAMUSCULAR | Status: DC | PRN
Start: 2013-09-15 — End: 2013-09-15

## 2013-09-15 MED ORDER — LIDOCAINE HCL (PF) 2 % IJ SOLN
INTRAMUSCULAR | Status: AC
Start: 2013-09-15 — End: ?
  Filled 2013-09-15: qty 5

## 2013-09-15 MED ORDER — ASPIRIN 325 MG PO TBEC
325.0000 mg | DELAYED_RELEASE_TABLET | Freq: Every day | ORAL | Status: DC
Start: 2013-09-15 — End: 2013-09-18
  Administered 2013-09-15 – 2013-09-17 (×3): 325 mg via ORAL
  Filled 2013-09-15 (×3): qty 1

## 2013-09-15 MED ORDER — PROPOFOL 10 MG/ML IV EMUL
INTRAVENOUS | Status: AC
Start: 2013-09-15 — End: ?
  Filled 2013-09-15: qty 50

## 2013-09-15 MED ORDER — SODIUM CHLORIDE 0.9 % IV SOLN
1000.0000 mg | Freq: Once | INTRAVENOUS | Status: DC
Start: 2013-09-15 — End: 2013-09-15
  Filled 2013-09-15: qty 10

## 2013-09-15 MED ORDER — METOCLOPRAMIDE HCL 5 MG/ML IJ SOLN
INTRAMUSCULAR | Status: DC | PRN
Start: 2013-09-15 — End: 2013-09-15
  Administered 2013-09-15: 10 mg via INTRAVENOUS

## 2013-09-15 MED ORDER — CEFTRIAXONE SODIUM 1 G IJ SOLR
INTRAMUSCULAR | Status: DC | PRN
Start: 2013-09-15 — End: 2013-09-15
  Administered 2013-09-15: 1 g via INTRAVENOUS

## 2013-09-15 MED ORDER — BACITRACIN 50000 UNITS IM SOLR
INTRAMUSCULAR | Status: DC | PRN
Start: 2013-09-15 — End: 2013-09-15
  Administered 2013-09-15 (×3): 100000 [IU]

## 2013-09-15 MED ORDER — CEFTRIAXONE SODIUM 1 G IJ SOLR
INTRAMUSCULAR | Status: AC
Start: 2013-09-15 — End: ?
  Filled 2013-09-15: qty 1000

## 2013-09-15 MED ORDER — FENTANYL CITRATE 0.05 MG/ML IJ SOLN
25.0000 ug | INTRAMUSCULAR | Status: DC | PRN
Start: 2013-09-15 — End: 2013-09-15

## 2013-09-15 MED ORDER — SODIUM CHLORIDE 0.9 % IV SOLN
INTRAVENOUS | Status: DC
Start: 2013-09-15 — End: 2013-09-19

## 2013-09-15 MED ORDER — HYDRALAZINE HCL 20 MG/ML IJ SOLN
5.0000 mg | INTRAMUSCULAR | Status: DC | PRN
Start: 2013-09-15 — End: 2013-09-15

## 2013-09-15 MED ORDER — FENTANYL CITRATE 0.05 MG/ML IJ SOLN
INTRAMUSCULAR | Status: AC
Start: 2013-09-15 — End: ?
  Filled 2013-09-15: qty 5

## 2013-09-15 MED ORDER — BACITRACIN 50000 UNITS IM SOLR
INTRAMUSCULAR | Status: AC
Start: 2013-09-15 — End: ?
  Filled 2013-09-15: qty 300000

## 2013-09-15 MED ORDER — VANCOMYCIN HCL 1000 MG IV SOLR
INTRAVENOUS | Status: AC
Start: 2013-09-15 — End: ?
  Filled 2013-09-15: qty 9000

## 2013-09-15 MED ORDER — HYDROCODONE-ACETAMINOPHEN 5-325 MG PO TABS
1.0000 | ORAL_TABLET | Freq: Once | ORAL | Status: AC | PRN
Start: 2013-09-15 — End: 2013-09-15

## 2013-09-15 MED ORDER — ONDANSETRON HCL 4 MG/2ML IJ SOLN
INTRAMUSCULAR | Status: AC
Start: 2013-09-15 — End: ?
  Filled 2013-09-15: qty 2

## 2013-09-15 MED ORDER — DEXAMETHASONE SODIUM PHOSPHATE 10 MG/ML IJ SOLN
INTRAMUSCULAR | Status: AC
Start: 2013-09-15 — End: ?
  Filled 2013-09-15: qty 1

## 2013-09-15 MED ORDER — LABETALOL HCL 5 MG/ML IV SOLN
5.0000 mg | INTRAVENOUS | Status: DC | PRN
Start: 2013-09-15 — End: 2013-09-15
  Administered 2013-09-15: 5 mg via INTRAVENOUS

## 2013-09-15 MED ORDER — TOBRAMYCIN SULFATE 1.2 G IJ SOLR
INTRAMUSCULAR | Status: AC
Start: 2013-09-15 — End: ?
  Filled 2013-09-15: qty 10800

## 2013-09-15 MED ORDER — PHENYLEPHRINE HCL 10 MG/ML IJ SOLN
INTRAMUSCULAR | Status: AC
Start: 2013-09-15 — End: ?
  Filled 2013-09-15: qty 1

## 2013-09-15 MED ORDER — HYDROCODONE-ACETAMINOPHEN 5-325 MG PO TABS
ORAL_TABLET | ORAL | Status: AC
Start: 2013-09-15 — End: 2013-09-15
  Administered 2013-09-15: 1 via ORAL
  Filled 2013-09-15: qty 1

## 2013-09-15 MED ORDER — BUPIVACAINE HCL (PF) 0.5 % IJ SOLN
INTRAMUSCULAR | Status: DC | PRN
Start: 2013-09-15 — End: 2013-09-15
  Administered 2013-09-15: 12 mg via INTRATHECAL

## 2013-09-15 MED ORDER — LACTATED RINGERS IV SOLN
INTRAVENOUS | Status: DC | PRN
Start: 2013-09-15 — End: 2013-09-15

## 2013-09-15 MED ORDER — PROPOFOL INFUSION 10 MG/ML
INTRAVENOUS | Status: DC | PRN
Start: 2013-09-15 — End: 2013-09-15
  Administered 2013-09-15: 44 ug/kg/min via INTRAVENOUS

## 2013-09-15 MED ORDER — VANCOMYCIN HCL POWD
Status: DC | PRN
Start: 2013-09-15 — End: 2013-09-15
  Administered 2013-09-15: 9000 mg

## 2013-09-15 MED ORDER — METOCLOPRAMIDE HCL 5 MG/ML IJ SOLN
INTRAMUSCULAR | Status: AC
Start: 2013-09-15 — End: ?
  Filled 2013-09-15: qty 2

## 2013-09-15 MED ORDER — CEFAZOLIN SODIUM 1 G IJ SOLR
1.0000 g | Freq: Three times a day (TID) | INTRAMUSCULAR | Status: DC
Start: 2013-09-15 — End: 2013-09-16
  Administered 2013-09-15 – 2013-09-16 (×3): 1 g via INTRAVENOUS
  Filled 2013-09-15 (×5): qty 1000

## 2013-09-15 MED ORDER — FAMOTIDINE 20 MG/2ML IV SOLN
INTRAVENOUS | Status: AC
Start: 2013-09-15 — End: ?
  Filled 2013-09-15: qty 2

## 2013-09-15 MED ORDER — MIDAZOLAM HCL 5 MG/5ML IJ SOLN
INTRAMUSCULAR | Status: AC
Start: 2013-09-15 — End: ?
  Filled 2013-09-15: qty 5

## 2013-09-15 MED ORDER — ACETAMINOPHEN 325 MG PO TABS
650.0000 mg | ORAL_TABLET | ORAL | Status: DC
Start: 2013-09-15 — End: 2013-09-15

## 2013-09-15 MED ORDER — MIDAZOLAM HCL 2 MG/2ML IJ SOLN
INTRAMUSCULAR | Status: DC | PRN
Start: 2013-09-15 — End: 2013-09-15
  Administered 2013-09-15: 5 mg via INTRAVENOUS

## 2013-09-15 MED ORDER — FENTANYL CITRATE 0.05 MG/ML IJ SOLN
INTRAMUSCULAR | Status: DC | PRN
Start: 2013-09-15 — End: 2013-09-15
  Administered 2013-09-15: 25 ug via INTRATHECAL

## 2013-09-15 MED ORDER — DEXAMETHASONE SODIUM PHOSPHATE 4 MG/ML IJ SOLN (WRAP)
INTRAMUSCULAR | Status: DC | PRN
Start: 2013-09-15 — End: 2013-09-15
  Administered 2013-09-15: 10 mg via INTRAVENOUS

## 2013-09-15 MED ORDER — FAMOTIDINE 10 MG/ML IV SOLN (WRAP)
INTRAVENOUS | Status: DC | PRN
Start: 2013-09-15 — End: 2013-09-15
  Administered 2013-09-15: 20 mg via INTRAVENOUS

## 2013-09-15 MED ORDER — HYDRALAZINE HCL 20 MG/ML IJ SOLN
INTRAMUSCULAR | Status: AC
Start: 2013-09-15 — End: 2013-09-15
  Administered 2013-09-15: 5 mg via INTRAVENOUS
  Filled 2013-09-15: qty 1

## 2013-09-15 MED ORDER — FENTANYL CITRATE 0.05 MG/ML IJ SOLN
INTRAMUSCULAR | Status: AC
Start: 2013-09-15 — End: ?
  Filled 2013-09-15: qty 2

## 2013-09-15 MED ORDER — ONDANSETRON HCL 4 MG/2ML IJ SOLN
4.0000 mg | Freq: Once | INTRAMUSCULAR | Status: DC | PRN
Start: 2013-09-15 — End: 2013-09-15

## 2013-09-15 MED ORDER — ACETAMINOPHEN 325 MG PO TABS
ORAL_TABLET | ORAL | Status: AC
Start: 2013-09-15 — End: 2013-09-15
  Administered 2013-09-15: 650 mg via ORAL
  Filled 2013-09-15: qty 2

## 2013-09-15 MED ORDER — ONDANSETRON HCL 4 MG/2ML IJ SOLN
INTRAMUSCULAR | Status: DC | PRN
Start: 2013-09-15 — End: 2013-09-15
  Administered 2013-09-15: 4 mg via INTRAVENOUS

## 2013-09-15 MED ORDER — HYDROMORPHONE HCL PF 1 MG/ML IJ SOLN
0.5000 mg | INTRAMUSCULAR | Status: AC | PRN
Start: 2013-09-15 — End: 2013-09-15
  Administered 2013-09-15 (×2): 0.5 mg via INTRAVENOUS

## 2013-09-15 MED ORDER — POVIDONE-IODINE 5 % OP SOLN
OPHTHALMIC | Status: AC
Start: 2013-09-15 — End: ?
  Filled 2013-09-15: qty 30

## 2013-09-15 MED ORDER — LABETALOL HCL 5 MG/ML IV SOLN
INTRAVENOUS | Status: AC
Start: 2013-09-15 — End: 2013-09-15
  Administered 2013-09-15: 5 mg via INTRAVENOUS
  Filled 2013-09-15: qty 4

## 2013-09-15 MED ORDER — EPHEDRINE SULFATE 50 MG/ML IJ SOLN
INTRAMUSCULAR | Status: AC
Start: 2013-09-15 — End: ?
  Filled 2013-09-15: qty 1

## 2013-09-15 MED ORDER — MEPERIDINE HCL 25 MG/ML IJ SOLN
25.0000 mg | INTRAMUSCULAR | Status: DC | PRN
Start: 2013-09-15 — End: 2013-09-15

## 2013-09-15 SURGICAL SUPPLY — 84 items
ADHESIVE SKIN CLOSURE MEDLINE (Skin Closure) ×1 IMPLANT
ADHESIVE SKIN SURGISEAL .35ML (Suture) ×2 IMPLANT
ADHESIVE SKNCLS 2-OCTYL CYNCRLT .4ML SKN (Skin Closure) ×1
APPLCATOR CHLORAPREP 26ML (Prep) ×4 IMPLANT
BANDAGE CMPR VLCR PLSTR CTTN MED MTRX 15 (Bandage) ×1
BANDAGE MEDIUM COMPRESSION L15 YD X W6 IN ELASTIC VELCRO POLYESTER (Bandage) ×1 IMPLANT
BANDAGE MEDLINE MEDIUM COMPRESSION L15 (Bandage) ×1
BIT DRILL L145 MM OD4.5 MM QUICK COUPLING (Drillbits) ×1 IMPLANT
BIT DRILL L145 MM OD4.5 MM SYNTHES QUICK (Drillbits) ×1
BIT DRL 4.5MM 145MM QC REPRO (Drillbits) ×1
BOWL CEMENT GUN BATCH W NOZZLE (Ortho Supply) IMPLANT
CANISTER WND DRN 500ML INFOVAC SENSATRAC (Suction) ×1
CANISTER WOUND DRAINAGE GEL TUBE CLAMP (Suction) ×1
CANISTER WOUND DRAINAGE GEL TUBE CLAMP CONNECTOR INFOVAC SENSATRAC 500 (Suction) ×1 IMPLANT
CEMENT BONE SNGL PALACOS 1X40 (Cement) ×8 IMPLANT
CONTAINER SPEC LLDPE 16OZ W LF NS LEK (Procedure Accessories) ×6
CONTAINER SPECIMEN C16 OZ WIDE LEAK SHATTER RESISTANT SNAP ON LID (Procedure Accessories) ×3 IMPLANT
CUFF TOURNIQUET CYLINDRICAL L30 IN X W4 IN 2 PORT 1 BLADDER QUICK (Procedure Accessories) IMPLANT
CUFF TRNQT CYL CLR CUF 30X4IN LF STRL 2 (Procedure Accessories)
DIFFUSER AIR (Procedure Accessories) ×2 IMPLANT
DRAPE SRG PLS U STRDRP 51X47IN LF STRL (Drape) ×1
DRAPE STERI LARGE W/TOWEL (Drape) ×2 IMPLANT
DRAPE SURGICAL ADHESIVE L51 IN X W47 IN (Drape) ×1
DRAPE SURGICAL ADHESIVE L51 IN X W47 IN STERI-DRAPE CLEAR (Drape) ×1 IMPLANT
DRESSING TELFA 3X8IN STERILE (Dressing) ×2 IMPLANT
ELECTRODE ELECTROSRGCL BLADE STD L2.5IN MEGADYNE E-Z CLEAN PTFE MNPL (Blade) ×1 IMPLANT
ELECTRODE ELECTROSURGICAL BLADE STANDARD (Blade) ×1
ELECTRODE ESURG PTFE BLDE STD MEGADYNE (Blade) ×1
GLOVE SURG BIOGEL INDIC SZ 8.5 (Glove) ×2 IMPLANT
GLOVE SURG BIOGEL ORTHO SZ8.5 (Glove) ×8 IMPLANT
GOWN X-LRG POLY REINFORCED (Gown) ×2 IMPLANT
HOLDER CATH VLCR UNV CATH-MATE LF ADJ (Procedure Accessories) ×1
HOLDER CATHETER UNIVERSAL ADJUSTABLE (Procedure Accessories) ×1 IMPLANT
HOOD T4 STERI-SHIELD (Personal Protection) ×8 IMPLANT
KIT DRAINAGE L12.5 IN ROUND 3 SPRING (Drain)
KIT DRAINAGE L12.5 IN ROUND 3 SPRING EVACUATOR Y CONNECTOR TUBE HOLE (Drain) IMPLANT
KIT DRESSING L26 CM X W15 CM THK3.2 CM (Dressing) ×1
KIT DRESSING L26 CM X W15 CM THK3.2 CM LARGE 2 DRAPE PAD CONNECTOR (Dressing) ×1 IMPLANT
KIT DRN PVC 400CC RND 1/8IN 12.5IN LF (Drain)
KIT DRSG HDRPHB THK3.2CM LG GRNFM VAC (Dressing) ×1
MANIFOLD 4 PORT NEPTUNE FILTER (Filter) ×2 IMPLANT
NAIL HUM TI CANN 9X280 ST (Nail) ×2 IMPLANT
PAD ELECTROSRG GRND REM W CRD (Procedure Accessories) ×2 IMPLANT
PADDING CAST L4 YD X W6 IN UNDERCAST (Cast) ×2
PADDING CAST L4 YD X W6 IN UNDERCAST MILD STRETCH COHESIVE WEBRIL (Cast) ×2 IMPLANT
PADDING CAST SYNTHETIC 4INX4YD (Cast) ×2 IMPLANT
PADDING CST CTTN WBRL 4YDX6IN LF STRL (Cast) ×2
SCREW BN SS 3.5MM P/T 6.5MM 8MM 50MM NS (Screw) ×2 IMPLANT
SCREW L50 MM L16 MM OD6.5 MM ODSEC8 MM 3.5 MM PARTIAL THREAD STAINLESS (Screw) ×1 IMPLANT
SOL ALCOHOL ISOPROPYL 70% 4 OZ (Prep) ×2 IMPLANT
SOLUTION IRR 0.9% NACL 3L URMTC LF PLS (Irrigation Solutions) ×4
SOLUTION IRRIGATION 0.9% SODIUM CHLORIDE 3000 ML PLASTIC CONTAINER (Irrigation Solutions) ×2 IMPLANT
STAPLER SKIN L3.9 MM X W6.9 MM WIDE 35 (Skin Closure) ×1
STAPLER SKIN L3.9 MM X W6.9 MM WIDE 35 COUNT FIX HEAD RATCHET (Skin Closure) ×1 IMPLANT
STAPLER SKN SS W PRX PX .58MM 3.9X6.9MM (Skin Closure) ×1
STRAP POSITIONING L19 IN X W3.5 IN (Procedure Accessories) ×1
STRAP POSITIONING L19 IN X W3.5 IN STIRRUP SLIP RING TECLIN (Procedure Accessories) ×1 IMPLANT
STRAP PSTN TECLIN 19X3.5IN LF NS STRUP (Procedure Accessories) ×1
SUTURE ABS 1 CP COAT VCL 27IN BRD UD (Suture)
SUTURE ABS 2-0 SH MNCRL 27IN MFL UD (Suture) ×2
SUTURE COATED VICRYL 1 CP L27 IN BRAID (Suture)
SUTURE COATED VICRYL 1 CP L27 IN BRAID UNDYED ABSORBABLE (Suture) IMPLANT
SUTURE MONOCRYL 2-0 SH L27 IN (Suture) ×2
SUTURE MONOCRYL 2-0 SH L27 IN MONOFILAMENT UNDYED ABSORBABLE (Suture) ×2 IMPLANT
SYRINGE 20 ML BD LUER-LOK MEDICAL (Syringes, Needles) ×1 IMPLANT
SYRINGE BULB 60CC LATEX FREE (Syringes, Needles) ×2 IMPLANT
SYRINGE MED 20ML LL LF STRL (Syringes, Needles) ×2
TIP SCT VNYL ARG YNKR 18FR 25CM LF STRL (Tubing) ×1
TIP SUCTION ARGYLE YANKAUER OD18 FR L25 CM VINYL SMOOTH INNER LUMEN (Tubing) ×1 IMPLANT
TIP SUCTION OD18 FR L25 CM VINYL SMOOTH (Tubing) ×1
TOOL DISSECTING L14 CM BALL FLUTE OD4 MM (Drillbits) ×1
TOOL DISSECTING L14 CM BALL FLUTE OD4 MM MIDAS REX LEGEND BURR (Drillbits) ×1 IMPLANT
TOOL DSCT LGND 4MM 14CM (Drillbits) ×1
TOURNIQUET 34X4 PURPLE (Procedure Accessories) IMPLANT
TOURNIQUET 44X4 BLUE (Procedure Accessories) ×2 IMPLANT
TOWEL L26 IN X W17 IN COTTON PREWASH (Procedure Accessories) ×3
TOWEL L26 IN X W17 IN COTTON PREWASH DELINT BLUE ACTISORB SURGICAL (Procedure Accessories) ×3 IMPLANT
TOWEL SRG CTTN 26X17IN LF STRL PREWASH (Procedure Accessories) ×3
TRAY CATH FOLEY SILICONE 16FR (Catheter Urine) ×4 IMPLANT
TRAY SKIN PREP POV IODINE (Prep) ×2 IMPLANT
TRAY TOTAL KNEE PACK (Tray) ×2 IMPLANT
TUBE CULTURE STURTS LIQ BBL (Procedure Accessories) ×2 IMPLANT
WATER STERILE PVC FREE DEHP FREE 1000 ML (Solution) ×2 IMPLANT
WATER STRL 1000ML PIC LF PVC FR DEHP-FR (Solution) ×2

## 2013-09-15 NOTE — Plan of Care (Signed)
Problem: Health Promotion  Goal: Knowledge - health resources  Extent of understanding and conveyed about healthcare resources.   Intervention: Discharge planning  Faxed clinical update to [place] at [facility].

## 2013-09-15 NOTE — Anesthesia Postprocedure Evaluation (Signed)
Anesthesia Post Evaluation    Patient: [name]    Procedures performed: Procedure(s) with comments:  ARTHROPLASTY, KNEE, RESECTION - AND PLACEMENT OF ANTIBIOTIC SPACER  Placement of wound vac    Anesthesia type: Combined Spinal/Epidural    Patient location:Phase I PACU    Last vitals:   Filed Vitals:    09/15/13 1355   BP:    Pulse: 68   Temp:    Resp:    SpO2: 100%       Post pain: Patient not complaining of pain, continue current therapy      Mental Status:awake    Respiratory Function: tolerating nasal cannula    Cardiovascular: stable    Nausea/Vomiting: patient not complaining of nausea or vomiting    Hydration Status: adequate    Post assessment: no apparent anesthetic complications

## 2013-09-15 NOTE — Progress Notes (Signed)
PROGRESS NOTE    Date Time: 09/15/2013 7:14 AM  Patient Name: [name], [name]      Assessment:    Right TKA Irrigation and Debridement and Poly Exchange 09/05/13    Plan:   + Wound drainage, plan for resection arthroplasty and antibiotic spacer on Monday 09/15/13  PT - WBAT in Extension with brace on, No bending knee  DVT: ASA 325 bid  ID: Pan-Sensitive Proteus ssp Final Recommendations Cipro 750 bid for 6 weeks  D/c planning pending outcome of explantation    Subjective:   Feels well, pain controlled, no current complaints.    Medications:     Current Facility-Administered Medications   Medication Dose Route Frequency   . albuterol-ipratropium  3 mL Nebulization Q4H WA   . amLODIPine  5 mg Oral Daily   . aspirin EC  325 mg Oral Daily   . ceFAZolin  2 g Intravenous Q8H SCH   . celecoxib  200 mg Oral Daily   . cloNIDine  0.1 mg Oral Daily   . docusate sodium  100 mg Oral Q12H SCH   . ferrous sulfate 324 mg-ascorbic acid 500 mg combo dose   Oral Daily   . gabapentin  300 mg Oral BID   . hydrochlorothiazide  25 mg Oral Daily   . nebivolol  5 mg Oral QHS   . polycarbophil  1,250 mg Oral Daily   . polyethylene glycol  17 g Oral Daily   . potassium chloride  10 mEq Oral Daily   . povidone-iodine   Both Eyes Once   . Senna  17.2 mg Oral QHS   . traMADol  50 mg Oral Q6H SCH   . vancomycin  1,000 mg Intravenous On Call to OR       Physical Exam:     Filed Vitals:    09/15/13 0438   BP: 176/89   Pulse: 68   Temp: 98.1 F (36.7 C)   Resp: 16   SpO2: 93%       Intake and Output Summary (Last 24 hours) at Date Time  No intake or output data in the 24 hours ending 09/15/13 0714    General appearance - alert, well appearing, and in no distress  Musculoskeletal - Right knee  Dressing with SS drainage along distal 1/2. Wound well approximated without breakdown.  Fires quad/TA/EHL/GSC  SILT SP/DP/TN  WWP distally  Calves soft, nontender bilateral    Physical Therapy:   Pain Score: 2-mild pain (09/15/13 0438)  Weight Bearing Percent:  100  (09/11/13 1400)  Ambulation Distance (Feet): 80 Feet (09/14/13 1312)  Right Lower Extremity ROM:  (knee brace locked in extension) (09/07/13 0915)  Left Lower Extremity ROM: within functional limits (09/07/13 0915)         Knee AROM : no knee flexion (09/07/13 0915)    Labs:         Recent CBC   No results found for this basename: WHITEBLOODCE,RBC,HGB,HCT,,LABPLAT, NRBCA, REFLX, ANRBA in the last 24 hours  Recent BMP   No results found for this basename: GLU,BUN,CREAT,CA,NA,K,CL,CO2,AGAP in the last 24 hours  Recent PT/INR   No results found for this basename: PTI,INR,COUM,COUMP in the last 24 hours    Rads:   Radiological Procedure reviewed.    Signed by: Garey Ham

## 2013-09-15 NOTE — Transfer of Care (Signed)
Anesthesia Transfer of Care Note    Patient: [name]    Procedures performed: Procedure(s) with comments:  ARTHROPLASTY, KNEE, RESECTION - AND PLACEMENT OF ANTIBIOTIC SPACER  Placement of wound vac    Anesthesia type: Combined Spinal/Epidural    Patient location:Phase I PACU    Last vitals:   Filed Vitals:    09/15/13 1355   BP:    Pulse: 68   Temp:    Resp:    SpO2: 100%       Post pain: Patient not complaining of pain, continue current therapy      Mental Status:awake    Respiratory Function: tolerating nasal cannula    Cardiovascular: stable    Nausea/Vomiting: patient not complaining of nausea or vomiting    Hydration Status: adequate    Post assessment: no apparent anesthetic complications

## 2013-09-15 NOTE — PT Progress Note (Signed)
Physical Therapy Note    PT attempted 2X this AM for gait training, however, pt unable to participate 2* to pt refused treatment now.  Pt going for surgery at 1:30 PM today.  Lorenz Coaster, PT  09/15/2013  11:30 AM

## 2013-09-15 NOTE — Progress Notes (Signed)
[facility] PROGRESS NOTE    Date Time: 09/15/2013 8:31 AM  Patient Name: [name], [name]        Subjective:   "I'm feeling good today. I'm going to surgery." Daughter with patient.    Medications:      Scheduled Meds: PRN Meds:           albuterol-ipratropium 3 mL Nebulization Q4H WA   amLODIPine 5 mg Oral Daily   aspirin EC 325 mg Oral Daily   ceFAZolin 2 g Intravenous Q8H SCH   celecoxib 200 mg Oral Daily   cloNIDine 0.1 mg Oral Daily   docusate sodium 100 mg Oral Q12H SCH   ferrous sulfate 324 mg-ascorbic acid 500 mg combo dose  Oral Daily   gabapentin 300 mg Oral BID   hydrochlorothiazide 25 mg Oral Daily   nebivolol 5 mg Oral QHS   polycarbophil 1,250 mg Oral Daily   polyethylene glycol 17 g Oral Daily   potassium chloride 10 mEq Oral Daily   povidone-iodine  Both Eyes Once   Senna 17.2 mg Oral QHS   traMADol 50 mg Oral Q6H SCH   vancomycin 1,000 mg Intravenous On Call to OR         Continuous Infusions:         albuterol-ipratropium 3 mL Q4H PRN   bisacodyl 10 mg QD PRN   HYDROcodone-acetaminophen 1 tablet Q4H PRN   HYDROcodone-acetaminophen 2 tablet Q6H PRN   HYDROcodone-acetaminophen 2 tablet Q4H PRN   HYDROmorphone 0.5 mg Q1H PRN   hydrOXYzine 25 mg Q12H PRN   ondansetron 4 mg Q8H PRN   ondansetron 4 mg Q8H PRN   senna-docusate 2 tablet BID PRN   zolpidem 5 mg QHS PRN         I personally reviewed all of the medications      Review of Systems:   A comprehensive review of systems was obtained from chart review and the patient  General ROS: negative  Ophthalmic ROS: negative  Endocrine ROS: negative  Respiratory ROS: no cough, denies shortness of breath, or wheezing  Cardiovascular ROS: no chest pain or some dyspnea on exertion  Gastrointestinal ROS: no abdominal pain, last BM 12/5, +BM  Genito-Urinary ROS: no c/o dysuria, trouble voiding, or hematuria  Musculoskeletal ROS: right knee pain- managed with pain meds  Neurological ROS: no TIA or stroke symptoms, denies dizziness      Physical Exam:      Filed Vitals:    09/14/13 1505 09/14/13 1941 09/14/13 2203 09/15/13 0438   BP: 179/77 170/76 161/73 176/89   Pulse: 70 76  68   Temp: 98.1 F (36.7 C) 96.6 F (35.9 C)  98.1 F (36.7 C)   TempSrc:       Resp: 18 18  16    Height:       Weight:       SpO2:  97%  93%         Intake and Output Summary (Last 24 hours) at Date Time  No intake or output data in the 24 hours ending 09/15/13 0831    General appearance - alert, well appearing, and in no distress  Mental status - alert, oriented to person, place, and time  Chest - clear to auscultation, mild wheezes- late on expiration, no rales or rhonchi, symmetric air entry  Heart - normal rate, regular rhythm, normal S1, S2, no murmurs  Abdomen - bowel sounds normal, abdomen is soft, nontender, non distended, obese  Neurological -  alert, oriented, normal speech, no focal findings or movement disorder noted  Musculoskeletal - laying in bed  Extremities - peripheral pulses normal, no pedal edema, no clubbing or cyanosis  RLE w/ brace  Consultant note reviewed.    Labs:       Lab 09/14/13 0416 09/12/13 0423 09/11/13 1139   GLU 94 104* 100   BUN 13 19 21    CREAT 0.8 0.8 0.8   CA 8.7 8.4 8.6   NA 142 143 142   K 4.0 3.8 4.0   CL 103 106 102   CO2 28 26 25    ALB -- -- --   PHOS -- -- --   MG -- 1.9 1.9   EGFR >60.0 >60.0 >60.0   PT 14.4 -- --   PTT -- -- --   INR 1.1 -- --       No results found for this basename: AST:3,ALT:3,ALKPHOS:3,ALB:3,BILITOTAL:3,AMY:3,LIP:3 in the last 168 hours      Lab 09/14/13 0416 09/09/13 0436   WBC -- --   HGB 10.1* 7.8*   HCT 30.7* 24.7*   MCV -- --   MCH -- --   MCHC -- --   RDW -- --   MPV -- --       No results found for this basename: TSH,FREET3,FREET4 in the last 168 hours        Rads:     Radiology Results (24 Hour)     ** No Results found for the last 24 hours. **                  Assessment and Plan:      Patient Active Problem List   Diagnosis   . S/p R TKA, readmitted 09/05/13. S/p R TKA I&D and Poly Exchange, going to surgery  today for Revision- Continue plan per ID and Ortho    . Low back pain - controlled   . Chronic obstructive pulmonary disease- O2 sat stable on RA. S/p fluid overload, treated with lasix. -Received lasix with BNP slight improvement. Continue duonebs ATC. Continue to follow and assess need for additional laxis   . Hypertension-  BP elevated- will resceive usual BP meds- amlodipine, clonidine, and bystolic. Continue to follow   Post-operative Acute Anemia, secondary to blood loss, s/p surgery. Continue Fe/Vit C. Pt s/p transfusion of 2 units PRBC.  Constipation- continue current meds.    Signed by: Sheryle Hail, NP

## 2013-09-15 NOTE — Plan of Care (Signed)
Problem: Health Promotion  Goal: Knowledge - disease process  Extent of understanding conveyed about a specific disease process.   Outcome: Progressing  Patient for surgery this am, discussed with her regarding NPO after mn, use of IS and CHG bath. Pre-op checklist initiated.

## 2013-09-15 NOTE — Op Note (Signed)
FULL OPERATIVE NOTE    Date Time: 09/15/2013 5:25 PM  Patient Name: [name]  Attending Physician: Lane Hacker, MD      Date of Operation:   09/05/2013 - 09/15/2013    Providers Performing:   Lane Hacker, M.D.    Assistant (s): Maia Plan PA    Preoperative Diagnosis:   Right knee periprosthetic joint infection    Postoperative Diagnosis:   Right knee periprosthetic joint infection    Operative Procedure:   #1) Right knee resection arthroplasty  #2) Implantation of drug delivery device  #3) Application of a wound vac <50cm squared    Anesthesia:   Spinal, epidural    Estimated Blood Loss:   400    Fluids:   See anesthesia    Specimens:   Deep tissue x 3 to microbiology  Deep fluid (joint) x 1 to microbiology    Complications:   None, stable to PACU.    Implants Utilized:   Synthes 9mm x humeral nail  Palacos cement with tobramycin/vancomycin      Indications:   This is a 77 y.o.-year-old patient with a history of Right knee pain.  The patient presented with fevers, elevated ESR/CRP and a warm, swollen Right knee.   She was taken to the OR on 09/05/13 for an acute postoperative infection.  An irrigation and debridement and polyethylene exchange was performed.  She was admitted to the floor for IV antibiotics and infectious disease consult.  She was put on vancomycin, Ancef and Ciprofloxacin.  Despite this, and a wound vac she continued to drain from the wound, nearly saturating the dressings every 2 days.  In the setting of an elevated ESR/CRP, as well as elevated cell count aspirate and positive cultures the patient decided to proceed with a resection arthroplasty of the Right knee.  The risks and benefits of the procedure were discussed at length with the patient.      Operative Notes:   Prior to initiation of the operative procedure a surgical time-out was taken and the patient's name, medical record, number, date-of-birth, and operative side was verified with the surgical consent  form.  Additionally it was verified that the appropriate peri-operative antibiotic administration was completed, as well as that radiographs were available and the correct operative instrumentation and implants were available.    After routine preparation and draping of the patient in the total joint operating room, the esmark bandage was utilized to exsanguinate the extremity and the tourniquet inflated.  A midline skin incision was made over the knee joint utilizing the previous surgical incision.  The medial border of the patella, tibial tubercle, and VMO were clearly identified.  A medial parapatellar approach was made to the Right knee joint.     There was purulence noted immediately upon exposure of the knee joint.  Multiple cultures were taken prior to administration of antibiotics.  A complete synovectomy including the medial and lateral gutters was done.  A medial release was performed using blunt dissection with a cobb elevator and electrocautery.  The knee was flexed to 90 degrees.  A homan retractor was then placed in the posterior aspect of the knee and the proximal tibial surface exposed.  The implant-cement and the cement-bone interfaces were exposed.  It was noted that the tibial component was well fixed.  The implant-cement interface was disrupted utilizing osteotomes.  The tibial component was removed and the remaining cement was then removed from the cut surface of the tibia  utilizing a saw, high-speed burr and a rongeur.  Cement was removed from the intramedullary canal of the tibia utilizing reverse curettes and pituitary instruments.      Attention was turned to the femur.  Homan retractors were placed to expose the femur.  The implant-cement and the cement-bone interfaces on the femur were exposed.  It was noted that the femoral component was also well-fixed.  The implant-cement interface was disrupted utilizing osteotomes.  The femoral component was removed and the remaining cement was then  removed from the cut surface of the femur utilizing a high-speed burr and a rongeur.  Cement was removed from the intramedullary canal of the femur utilizing reverse curettes and pituitary instruments.  A laminar spreader was then placed and a posterior synovectomy was completed.  When removing the femoral component there was a fracture of both condyles that was noted.  It still had soft tissue attachments medially and was later internally fixed with a single 6.55mm partially threaded screw from the medial condyle into the shaft.  The piece was stable after fixation.    Attention was then turned to the patellar component.  Utilizing towel clips the patella was everted and exposed.  The patellar component was removed using a saw and the remaining plastic in the peg holes was removed with a high-speed burr.  At this point the knee was irrigated with pulse lavage with 9L of antibiotic-impregnated irrigation fluid.    At this point the stage 1 spacer was prepared with 9 grams tobramycin, 9 grams Vancomycin and 3 40-gram bags of palacos cement.  The humeral nail was inserted into the femur in a retrograde fashion and then into the tibia in an antegrade fashion, centering the nail in the knee joint.  The static spacer was prepared and cemented to the bone with palacos cement.  The knee was noted to be stable and final reduction revealed excellent alignment and stability.      The wound was closed in layers using PDS suture, monocryl suture, and Nylon on the skin.  When closing the distal portion of the arthrotomy, the medial sleeve of tissue was deficient and was unable to be closed.  It is anticipated that she will need a gastrocnemius rotational flap for coverage of this part of the wound.  The distal 8 cm of skin was left open and a wound vac sponge was packed into the wound.  A negative pressure wound vac was placed on the unclosed portion of the wound, which was approximately 8 cm x 2cm.   A soft compression dressing  was applied.  A hinged knee brace was applied and locked in full extension at the end of the procedure.     The first-assistant was critical to all steps of the operation, including retraction and leg stabilization during exposure and bone preparation, as well as the deep and superficial wound closure.  Dr. Ahmed Prima performed and/or supervised the key portions of this surgical procedure, including evaluation of the bone cuts, reshaping of the bony elements, and insertion of the provisional and final components.      Signed by: Garey Ham, MD

## 2013-09-15 NOTE — PACU (Signed)
2105 BP 173/ 69 MMHG. Anesthetist present order given to send patient to the floor.

## 2013-09-15 NOTE — Anesthesia Preprocedure Evaluation (Addendum)
Anesthesia Evaluation    AIRWAY    Mallampati: II    TM distance: >3 FB  Neck ROM: full  Mouth Opening:full   CARDIOVASCULAR    cardiovascular exam normal       DENTAL    No notable dental hx     PULMONARY    pulmonary exam normal, wheezes and bibasilar     OTHER FINDINGS    Mild wheezes when upright.  No wheezes when supine        PSS Anesthesia Comments: ASA 3 morbid obese        Anesthesia Plan    ASA 3     CSE                             Post op pain management: per surgeon    informed consent obtained    ECG reviewed  pertinent labs reviewed

## 2013-09-15 NOTE — Anesthesia Procedure Notes (Signed)
Combined Spinal epidural    Patient location during procedure: pre-opReason for block: Primary Anesthesia In the OR  Requested by Surgeon: Yes   Start time: 09/15/2013 1:30 PM  End time: 09/15/2013 1:45 PM  Staffing  Anesthesiologist: Juliet Rude VPreanesthetic Checklist Completed: patient identified, surgical consent, pre-op evaluation, timeout performed, risks and benefits discussed, monitors and equipment checked, anesthesia consent given and correct site  Timeout Completed:  09/15/2013 1:30 PM  CSE  Patient monitoring: Pulse oximetry, EKG, NIBP and Nasal cannula O2  Premedication: Yes    Patient position: sitting  Sterile Technique: Betadine, sterile drape, mask  and sterile gloves  Skin Local: lidocaine 1%  Interspace: L2-3  Number of attempts: 1    Approach: midline    Injection technique: LOR air  Needle type: Touhy needle     Needle gauge: 17  CSF Return: Yes  Blood Return: No              Catheter type: end hole    Catheter size: 20 G    Catheter at skin depth: 12 cm  CSF Return: Yes        Pressure Paresthesia: no      Assessment   Sensory level: T10  Block Outcome: patient tolerated procedure well and no complications  Additional Notes  SPINAL CATH

## 2013-09-16 ENCOUNTER — Encounter: Payer: Self-pay | Admitting: Orthopaedic Surgery

## 2013-09-16 LAB — GFR: EGFR: >60

## 2013-09-16 LAB — BASIC METABOLIC PANEL
BUN: 16 mg/dL (ref 7–21)
CO2: 24 mEq/L (ref 22–29)
Calcium: 8.2 mg/dL (ref 7.9–10.6)
Chloride: 107 mEq/L (ref 98–107)
Creatinine: 0.8 mg/dL (ref 0.6–1.0)
Glucose: 135 mg/dL — ABNORMAL HIGH (ref 70–100)
Potassium: 4.4 mEq/L (ref 3.5–5.1)
Sodium: 143 mEq/L (ref 136–145)

## 2013-09-16 LAB — HEMOGLOBIN AND HEMATOCRIT, BLOOD
Hematocrit: 28.1 % — ABNORMAL LOW (ref 37.0–47.0)
Hgb: 8.8 g/dL — ABNORMAL LOW (ref 12.0–16.0)

## 2013-09-16 MED ORDER — CEFAZOLIN SODIUM-DEXTROSE 2-3 GM-% IV SOLR
2.0000 g | Freq: Three times a day (TID) | INTRAVENOUS | Status: AC
Start: 2013-09-16 — End: 2013-09-17
  Administered 2013-09-16 – 2013-09-17 (×4): 2 g via INTRAVENOUS
  Filled 2013-09-16 (×4): qty 50

## 2013-09-16 MED ORDER — OXYCODONE HCL ER 10 MG PO T12A
10.0000 mg | EXTENDED_RELEASE_TABLET | Freq: Two times a day (BID) | ORAL | Status: DC
Start: 2013-09-16 — End: 2013-10-03
  Administered 2013-09-16 – 2013-10-03 (×33): 10 mg via ORAL
  Filled 2013-09-16 (×34): qty 1

## 2013-09-16 NOTE — Progress Notes (Signed)
Wound vac intact with 475 cc of sanguineous output, canister changed, low continuous suction.

## 2013-09-16 NOTE — Plan of Care (Signed)
Problem: Day 1 Post-op- Knee Surgery  Goal: Mobility/activity is maintained at optimum level for patient  Outcome: Progressing  PT Plan  Treatment/Interventions: Exercise;Gait training;Functional transfer training;LE strengthening/ROM;Endurance training;Bed mobility  Recommendation  Discharge Recommendation: Home with home health PT;Home with supervision  DME Recommended for Discharge:  (None-has equipment at home)  PT - Next Visit Recommendation: 09/16/13  PT Frequency: 7x/wk    Patient is agreeable to the following Goals:   Goals  Goal Formulation: With patient/family  Time for Goal Acheivement: 7 visits  Goals: Select goal  Pt Will Go Supine To Sit: With minimal assist  Pt Will Perform Sit to Stand: With minimal assist  Pt Will Transfer to Toilet: With minimal assist  Pt Will Transfer to Tub/Shower: With minimal assist  PT Will Demonstrate Car Transfer Technique: With minimal assist  Pt Will Ambulate: 31-50 feet;with rolling walker;With minimal assist  Pt Will Go Up / Down Stairs: 3-5 stairs;With minimal assist  Pt Will Perform Home Exer Program: With minimal assist  Pt Will Perform LE Dressing w/Device: With min assist

## 2013-09-16 NOTE — Plan of Care (Signed)
Problem: Safety  Goal: Patient will be free from injury during hospitalization  Outcome: Progressing  Hourly rounding done, call bell with in reach, safety maintained. Family at bedside.    Problem: Day of Surgery- Knee Surgery  Goal: Mobility/activity is maintained at optimum level for patient  Outcome: Progressing  Pt. Is able to do bedside exercises but declined to get OOB this AM, states she will participate in the both PT sessions she has scheduled for today, explained benefit and encouraged to do exercise.

## 2013-09-16 NOTE — Plan of Care (Signed)
Problem: Health Promotion  Goal: Knowledge - health resources  Extent of understanding and conveyed about healthcare resources.   Intervention: Discharge planning  Faxed clinical update to Elena at [employer].

## 2013-09-16 NOTE — Progress Notes (Signed)
Received pt back from pacu via bed alert and oriented x3, in no distress, c/o right knee pain at 7/10, was medicated with dilaudid 0.5mg  IV, BP was high in the 180's systaltically, HR 60-80'S, Denies CP/SOB, Dr.Morris was notified, no new orders given but to monitor. Has wound vac connected to surgical site, ace wrap and brace locked to extension, adjusted legs and made comfortable in bed. IVF infusing, on ABX therapy, plan of care discussed, family at bedside, foley intact. Safety rules reviewed and maintained, call bell with in reach, continue to monitor.

## 2013-09-16 NOTE — Anesthesia Postprocedure Evaluation (Signed)
Anesthesia Post Evaluation    Patient: [name]    Procedures performed: Procedure(s) with comments:  ARTHROPLASTY, KNEE, RESECTION - AND PLACEMENT OF ANTIBIOTIC SPACER  Placement of wound vac    Anesthesia type: Combined Spinal/Epidural    Patient location: [facility] Surgical Floor    Post pain: Patient not complaining of pain, continue current therapy    Post assessment: no apparent anesthetic complications and no reportable events.  Epidural catheter d/c'ed yesterday.  No neuro issues.    Last vitals:   Filed Vitals:    09/16/13 0745   BP: 133/58   Pulse: 80   Temp:    Resp: 16   SpO2: 98%       Post vital signs: stable    Level of consciousness: awake and alert     Respiratory: tolerating room air    Cardiovascular: stable    Other complications: none.

## 2013-09-16 NOTE — Progress Notes (Signed)
Infectious Diseases  Progress Note     Impression:   9 yof s/p resection arthroplasty of right knee. Previous Cx were Proteus mirabilis.    Recommendations:   Increase Ancef to 2 g q 8 for now  F/U Cx  Will change back to Cipro PO in a day or so.    Subjective:   No new problems    Review of Systems  Patient without nausea, vomiting, diarrhea, rash, cough, shortness of breath, abdominal or chest pain.      Objective:   BP 109/46  Pulse 79  Temp 98.6 F (37 C) (Oral)  Resp 20  Ht 1.6 m (5\' 3" )  Wt 112 kg (246 lb 14.6 oz)  BMI 43.75 kg/m2  SpO2 99%    Temp (24hrs), Avg:97.1 F (36.2 C), Min:96.3 F (35.7 C), Max:98.6 F (37 C)      General: awake, alert, oriented x 3; no acute distress.  Cardiovascular: regular rate and rhythm, no murmurs, rubs or gallops  Lungs: clear to auscultation bilaterally, without wheezing, rhonchi, or rales  Abdomen: soft, non-tender, non-distended; no palpable masses, no hepatosplenomegaly, normoactive bowel sounds, no rebound or guarding  Extremities: no clubbing, cyanosis, or edema  Other: leg brace in place        Labs:    Lab 09/16/13 0501 09/15/13 1239 09/14/13 0416   WBC -- 14.26* --   HGB 8.8* 11.2* 10.1*   HCT 28.1* 34.6* 30.7*   PLT -- 301 --   NEUTROPCT -- -- --   MONOPCT -- -- --       Lab 09/16/13 0501 09/14/13 0416 09/12/13 0423   NA 143 142 143   K 4.4 4.0 3.8   CL 107 103 106   CO2 24 28 26    BUN 16 13 19    CREAT 0.8 0.8 0.8   CA 8.2 8.7 8.4   ALB -- -- --   PROT -- -- --   BILITOTAL -- -- --   ALKPHOS -- -- --   ALT -- -- --   AST -- -- --   GLU 135* 94 104*

## 2013-09-16 NOTE — Addendum Note (Signed)
Addendum  created 09/16/13 1028 by Consuello Bossier, MD    Modules edited:Notes Section

## 2013-09-16 NOTE — Plan of Care (Signed)
Problem: Day of Surgery- Knee Surgery  Goal: Hemodynamic Stability  Outcome: Completed Date Met:  09/16/13  Pt. Is alert and oriented x3, BP reading high 160's-180's systaltically, SAT >95%on 2LNC, Denies CP/SOB, C/O Minimal to moderate knee pain, medicated with prn pain meds with good outcome, foley intact, wound vac in place with moderate output, Irom brace and ace wrap to right knee. Safety maintained , plan of care discussed.  Goal: Pain at adequate level as identified by patient  Outcome: Completed Date Met:  09/16/13  C/o right knee pain at 3-7/10, legs supported on pillow, IROM brace on, medicated with Norco and Dilaudid with relief currently sleeping..  Goal: Free from Infection  Outcome: Progressing  On continuous ABX therapy.

## 2013-09-16 NOTE — Progress Notes (Signed)
PROGRESS NOTE    Date Time: 09/16/2013 7:26 AM  Patient Name: [name], [name]      Assessment:    Right TKA Irrigation and Debridement and Poly Exchange 09/05/13    Plan:   + Wound drainage,Rresection arthroplasty and antibiotic spacer on Monday 09/15/13  PT - WBAT in Extension with brace on, No bending knee  DVT: ASA 325 bid  ID: Pan-Sensitive Proteus ssp Final Recommendations Cipro 750 bid for 6 weeks  Will need plastic surgery consult for possible wound coverage  Wound vac in place  D/c planning pending outcome of explantation    Subjective:   Feels well, pain controlled, no current complaints.    Medications:     Current Facility-Administered Medications   Medication Dose Route Frequency   . albuterol-ipratropium  3 mL Nebulization Q4H WA   . amLODIPine  5 mg Oral Daily   . aspirin EC  325 mg Oral Daily   . ceFAZolin  1 g Intravenous Q8H   . celecoxib  200 mg Oral Daily   . cloNIDine  0.1 mg Oral Daily   . docusate sodium  100 mg Oral Q12H SCH   . ferrous sulfate 324 mg-ascorbic acid 500 mg combo dose   Oral Daily   . gabapentin  300 mg Oral BID   . hydrochlorothiazide  25 mg Oral Daily   . nebivolol  5 mg Oral QHS   . polycarbophil  1,250 mg Oral Daily   . polyethylene glycol  17 g Oral Daily   . potassium chloride  10 mEq Oral Daily   . Senna  17.2 mg Oral QHS   . traMADol  50 mg Oral Q6H SCH   . [COMPLETED] vancomycin  1,000 mg Intravenous On Call to OR   . [DISCONTINUED] acetaminophen  650 mg Oral Q4H   . [DISCONTINUED] aspirin EC  325 mg Oral Daily   . [DISCONTINUED] ceFAZolin  2 g Intravenous Q8H SCH   . [DISCONTINUED] povidone-iodine   Both Eyes Once   . [DISCONTINUED] tranexamic acid (CYKLOPRON) IVPB  1,000 mg Intravenous Once       Physical Exam:     Filed Vitals:    09/16/13 0454   BP: 102/52   Pulse: 75   Temp: 96.6 F (35.9 C)   Resp: 18   SpO2: 97%       Intake and Output Summary (Last 24 hours) at Date Time    Intake/Output Summary (Last 24 hours) at 09/16/13 0726  Last data filed at 09/16/13  0615   Gross per 24 hour   Intake   1400 ml   Output   1375 ml   Net     25 ml       General appearance - alert, well appearing, and in no distress  Musculoskeletal - Right knee  Dressing with SS drainage along distal 1/2. Wound well approximated without breakdown.  Fires quad/TA/EHL/GSC  SILT SP/DP/TN  WWP distally  Calves soft, nontender bilateral    Physical Therapy:   Pain Score: 3-mild pain (09/16/13 0639)  Weight Bearing Percent: 100  (09/11/13 1400)  Ambulation Distance (Feet): 80 Feet (09/14/13 1312)  Right Lower Extremity ROM:  (knee brace locked in extension) (09/07/13 0915)  Left Lower Extremity ROM: within functional limits (09/07/13 0915)         Knee AROM : no knee flexion (09/07/13 0915)    Labs:         Recent CBC   Recent Labs   [facility]  09/16/13 0501 09/15/13 1239    RBC -- 4.15*    HGB 8.8* --    HCT 28.1* --    LABPLAT -- --     Recent BMP   Recent Labs   [facility] 09/16/13 0501    GLU 135*    BUN 16    CREAT 0.8    CA 8.2    NA 143    K 4.4    CL 107    CO2 24     Recent PT/INR   No results found for this basename: PTI,INR,COUM,COUMP in the last 24 hours    Rads:   Radiological Procedure reviewed.    Signed by: Garey Ham

## 2013-09-16 NOTE — Progress Notes (Signed)
[facility]ICINE PROGRESS NOTE    Date Time: 09/16/2013 8:25 AM  Patient Name: [name], [name]        Subjective:   "I'm feeling OK, just tired after pain medicine." Daughter with patient.    Medications:      Scheduled Meds: PRN Meds:           albuterol-ipratropium 3 mL Nebulization Q4H WA   amLODIPine 5 mg Oral Daily   aspirin EC 325 mg Oral Daily   ceFAZolin 1 g Intravenous Q8H   celecoxib 200 mg Oral Daily   cloNIDine 0.1 mg Oral Daily   docusate sodium 100 mg Oral Q12H SCH   ferrous sulfate 324 mg-ascorbic acid 500 mg combo dose  Oral Daily   gabapentin 300 mg Oral BID   hydrochlorothiazide 25 mg Oral Daily   nebivolol 5 mg Oral QHS   oxyCODONE 10 mg Oral Q12H SCH   polycarbophil 1,250 mg Oral Daily   polyethylene glycol 17 g Oral Daily   potassium chloride 10 mEq Oral Daily   Senna 17.2 mg Oral QHS   traMADol 50 mg Oral Q6H SCH   [COMPLETED] vancomycin 1,000 mg Intravenous On Call to OR   [DISCONTINUED] acetaminophen 650 mg Oral Q4H   [DISCONTINUED] aspirin EC 325 mg Oral Daily   [DISCONTINUED] ceFAZolin 2 g Intravenous Q8H [facility]   [DISCONTINUED] povidone-iodine  Both Eyes Once   [DISCONTINUED] tranexamic acid (CYKLOPRON) IVPB 1,000 mg Intravenous Once         Continuous Infusions:       . sodium chloride 100 mL/hr at 09/15/13 2208         albuterol-ipratropium 3 mL Q4H PRN   bisacodyl 10 mg QD PRN   HYDROcodone-acetaminophen 1 tablet Q4H PRN   [COMPLETED] HYDROcodone-acetaminophen 1 tablet Once PRN   HYDROcodone-acetaminophen 2 tablet Q6H PRN   HYDROcodone-acetaminophen 2 tablet Q4H PRN   HYDROmorphone 0.5 mg Q1H PRN   [COMPLETED] HYDROmorphone 0.5 mg Q5 Min PRN   hydrOXYzine 25 mg Q12H PRN   ondansetron 4 mg Q8H PRN   ondansetron 4 mg Q8H PRN   senna-docusate 2 tablet BID PRN   zolpidem 5 mg QHS PRN   [DISCONTINUED] bacitracin  PRN   [DISCONTINUED] fentaNYL 25 mcg Q5 Min PRN   [DISCONTINUED] hydrALAZINE 5 mg PRN   [DISCONTINUED] labetalol 5 mg PRN   [DISCONTINUED] meperidine 25 mg Q2H PRN    [DISCONTINUED] metoclopramide 10 mg Once PRN   [DISCONTINUED] ondansetron 4 mg Once PRN   [DISCONTINUED] tobramycin  PRN   [DISCONTINUED] Vancomycin HCl  PRN         I personally reviewed all of the medications      Review of Systems:   A comprehensive review of systems was obtained from chart review and the patient  General ROS: negative  Ophthalmic ROS: negative  Endocrine ROS: negative  Respiratory ROS: no cough, denies shortness of breath, or wheezing  Cardiovascular ROS: no chest pain or some dyspnea on exertion  Gastrointestinal ROS: no abdominal pain, no N/V  Genito-Urinary ROS: no c/o dysuria, trouble voiding, or hematuria  Musculoskeletal ROS: right knee pain- managed with pain meds  Neurological ROS: no TIA or stroke symptoms, denies dizziness      Physical Exam:     Filed Vitals:    09/16/13 0118 09/16/13 0234 09/16/13 0454 09/16/13 0745   BP: 184/72 141/62 102/52 133/58   Pulse: 78 70 75 80   Temp:   96.6 F (35.9 C)  TempSrc:   Oral    Resp:   18 16   Height:       Weight:       SpO2:   97% 98%         Intake and Output Summary (Last 24 hours) at Date Time    Intake/Output Summary (Last 24 hours) at 09/16/13 0825  Last data filed at 09/16/13 0615   Gross per 24 hour   Intake   1400 ml   Output   1375 ml   Net     25 ml       General appearance - alert, well appearing, and in no distress  Mental status - alert, oriented to person, place, and time  Chest - clear to auscultation, no wheezing, no rales or rhonchi, symmetric air entry  Heart - normal rate, regular rhythm, normal S1, S2, no murmurs  Abdomen - bowel sounds normal, abdomen is soft, nontender, non distended, obese  Neurological - alert, oriented, normal speech, no focal findings or movement disorder noted  Musculoskeletal - laying in bed  Extremities - peripheral pulses normal, no pedal edema, no clubbing or cyanosis  RLE w/ brace and ace wrap dressing      Labs:       Lab 09/16/13 0501 09/14/13 0416 09/12/13 0423 09/11/13 1139   GLU 135*  94 104* --   BUN 16 13 19  --   CREAT 0.8 0.8 0.8 --   CA 8.2 8.7 8.4 --   NA 143 142 143 --   K 4.4 4.0 3.8 --   CL 107 103 106 --   CO2 24 28 26  --   ALB -- -- -- --   PHOS -- -- -- --   MG -- -- 1.9 1.9   EGFR >60.0 >60.0 >60.0 --   PT -- 14.4 -- --   PTT -- -- -- --   INR -- 1.1 -- --       No results found for this basename: AST:3,ALT:3,ALKPHOS:3,ALB:3,BILITOTAL:3,AMY:3,LIP:3 in the last 168 hours      Lab 09/16/13 0501 09/15/13 1239 09/14/13 0416   WBC -- 14.26* --   HGB 8.8* 11.2* 10.1*   HCT 28.1* 34.6* 30.7*   MCV -- 83.4 --   MCH -- 27.0* --   MCHC -- 32.4 --   RDW -- 15 --   MPV -- 9.5 --       No results found for this basename: TSH,FREET3,FREET4 in the last 168 hours        Rads:     Radiology Results (24 Hour)     Procedure Component Value Units Date/Time    X-ray knee right AP and lateral [[id number]]     Order Status:Sent  Updated:09/15/13 1756                  Assessment and Plan:      Patient Active Problem List   Diagnosis   . S/p R TKA, readmitted 09/05/13. S/p R TKA I&D and Poly Exchange, going to surgery today for Revision- Continue plan per ID and Ortho    . Low back pain - controlled   . Chronic obstructive pulmonary disease- Continue duonebs.    . Hypertension-  BP stable. Continue current meds and follow   Post-operative Acute Anemia, secondary to blood loss, s/p surgery. Continue Fe/Vit C. Pt s/p transfusions.  Constipation-resolved, continue bowel program.    Signed by: Sheryle Hail, NP

## 2013-09-16 NOTE — PT Progress Note (Signed)
Physical Therapy Treatment    Patient:  [name]        MRN#:  [id number]  Unit:  [place] [facility] 4C        Room/Bed:  M442/M442.01    Time of treatment: Start Time: 1300 Stop Time: 1345  Time Calculation (min): 45 min  PT Received On: 09/16/13    Treatment #: PT Visit Number: 1/7    Precautions  Weight Bearing Status: RLE WBAT  Total Knee Replacement: hinge brace;at all times (locked in extension)    Medical Diagnosis:   Infection of total knee replacement, initial encounter [996.66, V43.65] (INFECTED RT KNEE)  Total knee replacement status [V43.65] (Total knee replacement status)  Surgical Procedure:Right   Procedure(s):  ARTHROPLASTY, KNEE, RESECTION performed by  Lane Hacker, MD on 09/05/2013 - 09/15/2013.      Patient's medical condition is appropriate for Physical Therapy  intervention at this time.  Patient cleared by nurse to participate in PT session, nursing reports patient was c/o HA earlier.    Subjective: My HA is one, but I am having more pain than I thought.  Patient is agreeable to participation in the therapy session.  Patient Goal:  (To go home)  Pain Assessment  Pain Assessment: Numeric Scale (0-10)  Pain Score: 6-moderate pain  Pain Location: Knee  Pain Orientation: Right  Pain Descriptors: Sharp  Pain Frequency: Continuous  Effect of Pain on Daily Activities: severe  Patient's Stated Comfort Functional Goal: 1-mild pain  Pain Intervention(s): Cold applied;Repositioned          Objective:  Patient is in bed seen 1 Day Post-Op with dressings, telemetry and woundvac and hinged knee brace locked in extension.[name] ROM  Right Upper Extremity ROM: within functional limits  Left Upper Extremity ROM: within functional limits  Right Lower Extremity ROM:  (Knee brace locked in extension)  Left Lower Extremity ROM: within functional limits                                  Sensation is intact       Cognition  Arousal/Alertness: Appropriate responses to stimuli  Attention Span:  Appears intact  Memory: Appears intact  Following Commands: Follows all commands and directions without difficulty  Safety Awareness: independent  Insights: Fully aware of deficits  Problem Solving: Able to problem solve independently      Functional Mobility  Supine to Sit: Max assist to right (x 2)  Scooting: Max assist to right  Sit to Supine: Max assist to left (x 2)  Sit to Stand: Max assist (x 2)         Locomotion  Ambulation: minimal assistance;with front-wheeled walker  Ambulation Distance (Feet):  (4 steps sideways to the right)  Pattern: decreased cadence;decreased step length    Therapeutic Exercise  Quad Sets: 15  Glute Sets: 25  Ankle Pumps: 25       Self Care and Home Management:  Pt did not perform self care activities at this time.    Educated the patient to role of physical therapy, plan of care, goals  of therapy and safety with mobility and ADLs, weight bearing precautions, home safety.    Patient is in bed with all needs provided and call bell within reach. RN notified of session outcome, including patient stood bedside x 3 min..    Assessment:   Assessment  Assessment: Decreased  LE strength;Decreased UE strength;Decreased functional mobility;Decreased endurance/activity tolerance;Gait impairment  Prognosis: Good;With continued PT status post acute discharge.           Goals per Eval/Re-eval:     Goals  Goal Formulation: With patient/family  Time for Goal Acheivement: 7 visits  Goals: Select goal  Pt Will Go Supine To Sit: With minimal assist  Pt Will Perform Sit to Stand: With minimal assist  Pt Will Transfer to Toilet: With minimal assist  Pt Will Transfer to Tub/Shower: With minimal assist  PT Will Demonstrate Car Transfer Technique: With minimal assist  Pt Will Ambulate: 31-50 feet;with rolling walker;With minimal assist  Pt Will Go Up / Down Stairs: 3-5 stairs;With minimal assist  Pt Will Perform Home Exer Program: With minimal assist  Pt Will Perform LE Dressing w/Device: With min  assist          Plan:  Recommendation  Discharge Recommendation: Home with home health PT  DME Recommended for Discharge:  (None-has equipment at home)  PT - Next Visit Recommendation: 09/17/13  PT Frequency: 7x/wk    Continue plan of care.      Signature: Louann Liv, PTA 09/16/2013 2:02 PM

## 2013-09-16 NOTE — PT Eval Note (Signed)
[facility]  [address]  [place] [address]  [phone]    Physical Therapy Evaluation    Patient: [name] MRN: [id number]   Unit:  [facility] 4C Bed: X324/M010.27    Time of Treatment: Time Calculation  PT Received On: 09/16/13  Start Time: 1145  Stop Time: 1230  Time Calculation (min): 45 min    Consult received for [name] for PT evaluation and treatment.  Patient's medical condition is appropriate for Physical Therapy  intervention at this time.        Precautions  Weight Bearing Status: RLE WBAT  Total Knee Replacement: hinge brace;at all times  Exercises:     History of Present Illness: [name] is a 77 y.o. female admitted on 09/05/2013   Medical Diagnosis: Infection of total knee replacement, initial encounter [996.66, V43.65] (INFECTED RT KNEE)  Total knee replacement status [V43.65] (Total knee replacement status)  Surgical Diagnosis: Right TKR revision I & D Procedure(s):  ARTHROPLASTY, KNEE, RESECTION performed by  Lane Hacker, MD on 09/05/2013 - 09/15/2013.      Patient Active Problem List   Diagnosis   . Total knee replacement status   . Low back pain   . Chronic obstructive pulmonary disease   . Hypertension     Past Medical History   Diagnosis Date   . Hypertension    . Pneumonia    . Chronic obstructive pulmonary disease    . Headache(784.0)      not since surgery in 1957   . Arthritis    . Difficulty in walking(719.7)    . Low back pain      Past Surgical History   Procedure Date   . Cervical spine surgery    . Lumbar spine surgery posterior w/ hardware   . Tonsillectomy    . Brain surgery R side/ 1957 Optic nerve pressure   . Eye surgery      cataracts   . Colonoscopy    . Tubal ligation 1961   . Arthroplasty, knee, total 08/25/2013     Procedure: ARTHROPLASTY, KNEE, TOTAL;  Surgeon: Lane Hacker, MD;  Location: [facility];  Service: Orthopedics;  Laterality: Right;   . Arthroplasty, knee, revision total 09/05/2013      Procedure: ARTHROPLASTY, KNEE, REVISION TOTAL;  Surgeon: Lane Hacker, MD;  Location: [facility];  Service: Orthopedics;  Laterality: Right;  RT KNEE WASHOUT & POLY EXCHANGE **POA/CH** (STRYKER)    . Arthroplasty, knee, resection 09/15/2013     Procedure: ARTHROPLASTY, KNEE, RESECTION;  Surgeon: Lane Hacker, MD;  Location: [facility];  Service: Orthopedics;  Laterality: Right;  AND PLACEMENT OF ANTIBIOTIC SPACER  Placement of wound vac         Social History:  Prior Level of Function  Prior level of function: Ambulates with assistive device  Assistive Device: Front wheel walker  Baseline Activity Level: Community ambulation  DME Currently at Home: Four wheel walker;Single point cane;Crutches    Home Living Arrangements  Living Arrangements: Children  Type of Home: House  Home Layout: Two level  Bathroom Shower/Tub: Pension scheme manager: Raised  Bathroom Equipment: Careers adviser: Accessible  DME Currently at Home: Four wheel walker;Single point cane;Crutches  Home Living - Notes / Comments:  (Home with family)    Subjective: Patient is agreeable to participation in the therapy session.  Patient Goal  Patient Goal:  (To go  home)      Pain Assessment  Pain Assessment: Numeric Scale (0-10)  Pain Score: 7-severe pain  Pain Location: Knee  Pain Orientation: Right  Pain Descriptors: Constant  Pain Frequency: Continuous  Effect of Pain on Daily Activities: severe  Patient's Stated Comfort Functional Goal: 1-mild pain  Pain Intervention(s): Cold applied;Repositioned          Objective:  Patient is in bed with knee brace locked in extension, seen 1 Day Post-Op.    Observation of patient:     Cognition  Arousal/Alertness: Appropriate responses to stimuli  Attention Span: Appears intact  Memory: Appears intact  Following Commands: Follows all commands and directions without difficulty  Safety Awareness: independent  Insights: Fully aware of deficits  Problem Solving: Able  to problem solve independently       Vitals: 139/59 in sitting/supine. Pt c/o headache during therapy.      Musculoskeletal Examination  Gross ROM  Right Upper Extremity ROM: within functional limits  Left Upper Extremity ROM: within functional limits  Right Lower Extremity ROM:  (Knee brace locked in extension)  Left Lower Extremity ROM: within functional limits                                  Sensation is intact     Gross Strength  Right Upper Extremity Strength: within functional limits  Left Upper Extremity Strength: within functional limits  Right Lower Extremity Strength:  (Knee brace locked in extension)  Left Lower Extremity Strength: within functional limits                  Functional Mobility  Supine to Sit: Max assist to right  Scooting: Max assist to right  Sit to Supine: Max assist to left  Sit to Stand: Unable to assess (Comment) (Pt in too much pain to get OOB now.)       Balance  Sitting - Static: Good  Sitting - Dynamic: Fair           Therapeutic Exercises:  Quad Sets: 25  Glute Sets: 25  Ankle Pumps: 25      Participation and Endurance  Participation Effort: good  Endurance: Tolerates 10 - 20 min exercise with multiple rests        Educated the patient to role of physical therapy, plan of care, goals  of therapy and safety with mobility and ADLs.    Patient is in bed  with all needs provided and call bell within reach. RN notified of session outcome.        Assessment:  [name] is a 77 y.o. female admitted 09/05/2013. Pt presents with  Right TKR revision I & D  Procedure(s):  ARTHROPLASTY, KNEE, RESECTION. Patient present with deficits in strength, function, ambulation and ADLS.  Pt would benefit from skilled Physical Therapy Services.  Assessment  Assessment: Decreased LE ROM;Decreased LE strength;Decreased endurance/activity tolerance;Decreased functional mobility;Decreased balance;Gait impairment  Prognosis: Good;With continued PT status post acute discharge     Goals  Goal Formulation:  With patient/family  Time for Goal Acheivement: 7 visits  Goals: Select goal  Pt Will Go Supine To Sit: With minimal assist  Pt Will Perform Sit to Stand: With minimal assist  Pt Will Transfer to Toilet: With minimal assist  Pt Will Transfer to Tub/Shower: With minimal assist  PT Will Demonstrate Car Transfer Technique: With minimal assist  Pt Will Ambulate: 31-50 feet;with rolling  walker;With minimal assist  Pt Will Go Up / Down Stairs: 3-5 stairs;With minimal assist  Pt Will Perform Home Exer Program: With minimal assist  Pt Will Perform LE Dressing w/Device: With min assist    Rehabilitation Potential  Prognosis: Good;With continued PT status post acute discharge      Plan:   Plan  Risks/Benefits/POC Discussed with Pt/Family: With patient/family  Patient Goal:  (To go home)  Treatment/Interventions: Exercise;Gait training;Functional transfer training;LE strengthening/ROM;Endurance training;Bed mobility  Progress: Slow progress, decreased activity tolerance  PT Frequency: 7x/wk    Recommendation  Discharge Recommendation: Home with home health PT;Home with supervision  DME Recommended for Discharge:  (None-has equipment at home)  PT - Next Visit Recommendation: 09/16/13  PT Frequency: 7x/wk    Signature: Lorenz Coaster, PT

## 2013-09-17 LAB — HEMOGLOBIN AND HEMATOCRIT, BLOOD
Hematocrit: 24.7 % — ABNORMAL LOW (ref 37.0–47.0)
Hgb: 7.9 g/dL — ABNORMAL LOW (ref 12.0–16.0)

## 2013-09-17 LAB — BASIC METABOLIC PANEL
BUN: 19 mg/dL (ref 7–21)
CO2: 28 mEq/L (ref 22–29)
Calcium: 8.2 mg/dL (ref 7.9–10.6)
Chloride: 105 mEq/L (ref 98–107)
Creatinine: 0.8 mg/dL (ref 0.6–1.0)
Glucose: 104 mg/dL — ABNORMAL HIGH (ref 70–100)
Potassium: 4.1 mEq/L (ref 3.5–5.1)
Sodium: 140 mEq/L (ref 136–145)

## 2013-09-17 LAB — GFR: EGFR: >60

## 2013-09-17 MED ORDER — CIPROFLOXACIN HCL 250 MG PO TABS
750.0000 mg | ORAL_TABLET | Freq: Two times a day (BID) | ORAL | Status: DC
Start: 2013-09-18 — End: 2013-09-22
  Administered 2013-09-18 – 2013-09-22 (×9): 750 mg via ORAL
  Filled 2013-09-17 (×10): qty 3

## 2013-09-17 NOTE — Progress Notes (Signed)
Nutrition Follow-up    Assessment: Pt reports appetite remains low; Tolerating current diet otherwise. Food preferences recorded. Emphasized importance of adequate intake, especially protein r/t wound healing. Pt agreeable to trying oral supplement. Would like to try Boost Breeze.     Active Hospital Problems    Diagnosis   . Total knee replacement status       Orders Placed This Encounter   Procedures   . Diet regular       Current Meds:       albuterol-ipratropium 3 mL Q4H WA   amLODIPine 5 mg Daily   aspirin EC 325 mg Daily   ceFAZolin 2 g Q8H   celecoxib 200 mg Daily   cloNIDine 0.1 mg Daily   docusate sodium 100 mg Q12H SCH   ferrous sulfate 324 mg-ascorbic acid 500 mg combo dose  Daily   gabapentin 300 mg BID   hydrochlorothiazide 25 mg Daily   nebivolol 5 mg QHS   oxyCODONE 10 mg Q12H [facility]   polycarbophil 1,250 mg Daily   polyethylene glycol 17 g Daily   potassium chloride 10 mEq Daily   Senna 17.2 mg QHS   traMADol 50 mg Q6H [facility]   [DISCONTINUED] ceFAZolin 1 g Q8H          . sodium chloride 100 mL/hr at 09/15/13 2208       Recent Labs:    Lab 09/17/13 0500 09/16/13 0501 09/15/13 1239   WBC -- -- 14.26*   HGB 7.9* 8.8* 11.2*   HCT 24.7* 28.1* 34.6*   MCV -- -- 83.4   PLT -- -- 301       Lab 09/17/13 0500 09/16/13 0501 09/14/13 0416 09/12/13 0423 09/11/13 1139   NA 140 143 142 143 142   K 4.1 4.4 4.0 3.8 4.0   CL 105 107 103 106 102   CO2 28 24 28 26 25    BUN 19 16 13 19 21    CREAT 0.8 0.8 0.8 0.8 0.8   GLU 104* 135* 94 104* 100   CA 8.2 8.2 8.7 8.4 8.6   MG -- -- -- 1.9 1.9   PHOS -- -- -- -- --   EGFR >60.0 >60.0 >60.0 >60.0 >60.0     No results found for this basename: ALB:3, PREALB:3 in the last 168 hours      Intake/Output Summary (Last 24 hours) at 09/17/13 1610  Last data filed at 09/17/13 9604   Gross per 24 hour   Intake    100 ml   Output      0 ml   Net    100 ml       Anthropometrics  Height: 160 cm (5\' 3" )  Weight: 112 kg (246 lb 14.6 oz)  Weight Change: 0   IBW/kg (Calculated) Female: 56.37  kg  IBW/kg (Calculated) Female: 52.27 kg  BMI (calculated): 43.8     Estimated Nutrition Needs:  Estimated Energy Needs  Total Energy Estimated Needs: 2240 -3025  Method for Estimating Needs: 22-27 kcal / kg     Estimated Protein Needs  Total Protein Estimated Needs: 112-134  Method for Estimating Needs: 1-1.2 g / kg      Fluid Needs  Total Fluid Estimated Needs: 2800   Method for Estimating Needs: 25 mL / kg     Learning and Discharge Planning Needs: Discussed importance of adequate PO intake r/t wound healing. Pt verbalized understanding.     Nutrition Diagnosis:   Increased nutrient needs (specify) related  to medical condition as evidenced by pt s/p procedure, wound vac placed, low appetite.     Intervention:  Recommend: Boost Breeze 1x/day, Juven BID; Monitor albumin.    Goals:  Pt will increase PO intake to 75% or greater of meals and oral supplements as ordered within 2 wks.     M/E:  Will continue to monitor lab results, PO intake, order entry. Will follow up per protocol within 10 days.     Harrell Gave, MS, RD, LDN

## 2013-09-17 NOTE — Consults (Signed)
PLASTIC SURGERY CONSULTATION    Date Time: 09/17/2013 9:34 AM  Patient Name: [name]  Requesting Physician: Lane Hacker, MD      Reason for Consultation:   Nonhealing surgical wound right knee    Assessment:   Open wound at right total knee arthroplasty incision along inferior margin.    Plan:   The patient will need debridement and a gastrocnemius flap closure with split thickness skin graft. I will talk to Dr. Ahmed Prima about the timing of surgery. I explained the need for the surgery and how it is performed to the patient. Her daughter was present as well. All questions were answered.     History:   [name] is a 77 y.o. female who presents to the hospital on 09/05/2013 with a non-healing wound. She originally had a right TKA on 08/25/13. She was noted to have an infection and underwent a poly exchange on 09/05/13. She continued with persistent signs and symptoms of an infection and on 09/15/13 she had removal of her implant and placement of antibiotic spacer. In this last operation, she was noted to have a soft tissue defect due to the infection at her inferior suture line on the lower leg.     Past Medical History:     Past Medical History   Diagnosis Date   . Hypertension    . Pneumonia    . Chronic obstructive pulmonary disease    . Headache(784.0)      not since surgery in 1957   . Arthritis    . Difficulty in walking(719.7)    . Low back pain        Past Surgical History:     Past Surgical History   Procedure Date   . Cervical spine surgery    . Lumbar spine surgery posterior w/ hardware   . Tonsillectomy    . Brain surgery R side/ 1957 Optic nerve pressure   . Eye surgery      cataracts   . Colonoscopy    . Tubal ligation 1961   . Arthroplasty, knee, total 08/25/2013     Procedure: ARTHROPLASTY, KNEE, TOTAL;  Surgeon: Lane Hacker, MD;  Location: [facility];  Service: Orthopedics;  Laterality: Right;   . Arthroplasty, knee, revision total 09/05/2013     Procedure: ARTHROPLASTY, KNEE, REVISION  TOTAL;  Surgeon: Lane Hacker, MD;  Location: [facility];  Service: Orthopedics;  Laterality: Right;  RT KNEE WASHOUT & POLY EXCHANGE **POA/CH** (STRYKER)    . Arthroplasty, knee, resection 09/15/2013     Procedure: ARTHROPLASTY, KNEE, RESECTION;  Surgeon: Lane Hacker, MD;  Location: [facility];  Service: Orthopedics;  Laterality: Right;  AND PLACEMENT OF ANTIBIOTIC SPACER  Placement of wound vac       Family History:   No family history on file.    Social History:     History     Social History   . Marital Status: Widowed     Spouse Name: N/A     Number of Children: N/A   . Years of Education: N/A     Social History Main Topics   . Smoking status: Former Smoker -- 1.0 packs/day for 45 years     Quit date: 08/20/1998   . Smokeless tobacco: Never Used   . Alcohol Use: Yes      Comment: occassionally   . Drug Use: No   . Sexually Active:      Other Topics Concern   .  Not on file     Social History Narrative   . No narrative on file       Allergies:   No Known Allergies    Medications:     Current Facility-Administered Medications   Medication Dose Route Frequency   . albuterol-ipratropium  3 mL Nebulization Q4H WA   . amLODIPine  5 mg Oral Daily   . aspirin EC  325 mg Oral Daily   . ceFAZolin  2 g Intravenous Q8H   . celecoxib  200 mg Oral Daily   . cloNIDine  0.1 mg Oral Daily   . docusate sodium  100 mg Oral Q12H SCH   . ferrous sulfate 324 mg-ascorbic acid 500 mg combo dose   Oral Daily   . gabapentin  300 mg Oral BID   . hydrochlorothiazide  25 mg Oral Daily   . nebivolol  5 mg Oral QHS   . oxyCODONE  10 mg Oral Q12H SCH   . polycarbophil  1,250 mg Oral Daily   . polyethylene glycol  17 g Oral Daily   . potassium chloride  10 mEq Oral Daily   . Senna  17.2 mg Oral QHS   . traMADol  50 mg Oral Q6H SCH   . [DISCONTINUED] ceFAZolin  1 g Intravenous Q8H       Review of Systems:   A comprehensive review of systems was: negative    Physical Exam:     Filed Vitals:    09/17/13 0844   BP: 156/65   Pulse: 75    Temp: 98.8 F (37.1 C)   Resp: 18   SpO2:        Intake and Output Summary (Last 24 hours) at Date Time    Intake/Output Summary (Last 24 hours) at 09/17/13 0934  Last data filed at 09/17/13 1610   Gross per 24 hour   Intake    100 ml   Output      0 ml   Net    100 ml       General appearance - alert, well appearing, and in no distress  Mental status - alert, oriented to person, place, and time  Chest - clear to auscultation, no wheezes, rales or rhonchi, symmetric air entry  Heart - normal rate, regular rhythm, normal S1, S2, no murmurs, rubs, clicks or gallops  there is an open wound of the right lower leg measuring approximately 10 cm long by 5 cm wide by 5 cm deep. There is no odor. There is no frank necrosis noted.     Labs Reviewed:     Results     Procedure Component Value Units Date/Time    Basic metabolic panel [[id number]]  (Abnormal) Collected:09/17/13 0500    Specimen Information:Blood Updated:09/17/13 0616     Glucose 104 (H) mg/dL      BUN 19 mg/dL      Creatinine 0.8 mg/dL      CALCIUM 8.2 mg/dL      Sodium 119 mEq/L      Potassium 4.1 mEq/L      Chloride 105 mEq/L      CO2 28 mEq/L     Narrative:    [facility] x2    GFR [[id number]] Collected:09/17/13 0500     EGFR >60.0 Updated:09/17/13 0616    Narrative:    [facility] x2    Hemoglobin and hematocrit, blood [[id number]]  (Abnormal) Collected:09/17/13 0500    Specimen Information:Blood Updated:09/17/13 0532     Hgb 7.9 (  L) g/dL      Hematocrit 16.1 (L) %     Narrative:    [facility] x2    Wound culture & gram stain [[id number]] Collected:09/15/13 1600    Specimen Information:Wound / Tissue Updated:09/16/13 1801    Narrative:    ORDER#: [id number]                                    ORDERED BY: Ahmed Prima, NITIN  SOURCE: Tissue right knee # 3                        COLLECTED:  09/15/13 16:00  ANTIBIOTICS AT COLL.:                                RECEIVED :  09/15/13 21:42  Stain, Gram                                FINAL       09/15/13 22:39  09/15/13   Rare WBCs              No organisms seen  Culture Wound                              PRELIM      09/16/13 18:01  09/16/13   Culture no growth to date, Final report to follow      Wound Culture & Gram Stain [[id number]] Collected:09/15/13 1600    Specimen Information:Tissue Updated:09/16/13 1759    Narrative:    ORDER#: [id number]                                    ORDERED BY: Ahmed Prima, NITIN  SOURCE: Tissue R- knee # 1                           COLLECTED:  09/15/13 16:00  ANTIBIOTICS AT COLL.:                                RECEIVED :  09/15/13 22:09  Stain, Gram                                FINAL       09/15/13 22:36  09/15/13   Few WBCs             No organisms seen  Culture Wound                              PRELIM      09/16/13 17:59  09/16/13   Culture no growth to date, Final report to follow      Wound culture & gram stain [[id number]] Collected:09/15/13 1600    Specimen Information:Wound / Tissue Updated:09/16/13 1758    Narrative:    ORDER#: [id number]  ORDERED BY: GOYAL, NITIN  SOURCE: Tissue right knee #2                         COLLECTED:  09/15/13 16:00  ANTIBIOTICS AT COLL.:                                RECEIVED :  09/15/13 21:40  Stain, Gram                                FINAL       09/15/13 22:38  09/15/13   Few WBCs             No organisms seen  Culture Wound                              PRELIM      09/16/13 17:58  09/16/13   Culture no growth to date, Final report to follow      Wound culture & gram stain [[id number]] Collected:09/15/13 1510    Specimen Information:Wound / Synovial Fluid Updated:09/16/13 1552    Narrative:    ORDER#: [id number]                                    ORDERED BY: GOYAL, NITIN  SOURCE: Synovial Fluid right knee                    COLLECTED:  09/15/13 15:10  ANTIBIOTICS AT COLL.:                                RECEIVED :  09/15/13 19:47  Stain, Gram                                FINAL       09/15/13 20:41  09/15/13   Moderate WBCs             No organisms  seen  Culture Wound                              PRELIM      09/16/13 15:52  09/16/13   Culture no growth to date, Final report to follow            Recent CBC   Recent Labs   Basename 09/17/13 0500    RBC --    HGB 7.9*    HCT 24.7*    MCV --    MCH --    MCHC --    RDW --    MPV --    LABPLAT --       Signed by: Gwenyth Allegra

## 2013-09-17 NOTE — Progress Notes (Signed)
Above reviewed. Cultures still negative. Will recheck labs in am and change to Cipro in a.m.

## 2013-09-17 NOTE — Progress Notes (Signed)
[facility]IC[facility] PROGRESS NOTE    Date Time: 09/17/2013 8:12 AM  Patient Name: Carron Curie L        Subjective:   "I guess I'm doing OK." Daughter with patient.    Medications:      Scheduled Meds: PRN Meds:           albuterol-ipratropium 3 mL Nebulization Q4H WA   amLODIPine 5 mg Oral Daily   aspirin EC 325 mg Oral Daily   ceFAZolin 2 g Intravenous Q8H   celecoxib 200 mg Oral Daily   cloNIDine 0.1 mg Oral Daily   docusate sodium 100 mg Oral Q12H SCH   ferrous sulfate 324 mg-ascorbic acid 500 mg combo dose  Oral Daily   gabapentin 300 mg Oral BID   hydrochlorothiazide 25 mg Oral Daily   nebivolol 5 mg Oral QHS   oxyCODONE 10 mg Oral Q12H SCH   polycarbophil 1,250 mg Oral Daily   polyethylene glycol 17 g Oral Daily   potassium chloride 10 mEq Oral Daily   Senna 17.2 mg Oral QHS   traMADol 50 mg Oral Q6H [facility]   [DISCONTINUED] ceFAZolin 1 g Intravenous Q8H         Continuous Infusions:       . sodium chloride 100 mL/hr at 09/15/13 2208         albuterol-ipratropium 3 mL Q4H PRN   bisacodyl 10 mg QD PRN   HYDROcodone-acetaminophen 1 tablet Q4H PRN   HYDROcodone-acetaminophen 2 tablet Q6H PRN   HYDROcodone-acetaminophen 2 tablet Q4H PRN   HYDROmorphone 0.5 mg Q1H PRN   hydrOXYzine 25 mg Q12H PRN   ondansetron 4 mg Q8H PRN   ondansetron 4 mg Q8H PRN   senna-docusate 2 tablet BID PRN   zolpidem 5 mg QHS PRN         I personally reviewed all of the medications      Review of Systems:   A comprehensive review of systems was obtained from chart review and the patient  General ROS: negative  Ophthalmic ROS: negative  Endocrine ROS: negative  Respiratory ROS: no cough, denies shortness of breath, or wheezing, but states she wheezes at home  Cardiovascular ROS: no chest pain or some dyspnea on exertion  Gastrointestinal ROS: no abdominal pain, no N/V  Genito-Urinary ROS: no c/o dysuria, trouble voiding, or hematuria  Musculoskeletal ROS: right knee pain- managed with pain meds  Neurological ROS: no TIA or stroke  symptoms, denies dizziness      Physical Exam:     Filed Vitals:    09/16/13 0745 09/16/13 1543 09/16/13 1948 09/17/13 0541   BP: 133/58 109/46 158/76 147/67   Pulse: 80 79 75 69   Temp:  98.6 F (37 C) 97 F (36.1 C) 98.2 F (36.8 C)   TempSrc:   Oral Oral   Resp: 16 20 17 18    Height:       Weight:       SpO2: 98% 99% 95% 95%         Intake and Output Summary (Last 24 hours) at Date Time    Intake/Output Summary (Last 24 hours) at 09/17/13 0981  Last data filed at 09/17/13 0608   Gross per 24 hour   Intake    100 ml   Output      0 ml   Net    100 ml       General appearance - alert, well appearing, and in no distress  Mental status -  alert, oriented to person, place, and time  Chest - +wheezing, no rales or rhonchi, symmetric air entry  Heart - normal rate, regular rhythm, normal S1, S2, no murmurs  Abdomen - bowel sounds normal, abdomen is soft, nontender, non distended, obese  Neurological - alert, oriented, normal speech, no focal findings or movement disorder noted  Musculoskeletal - laying in bed  Extremities - peripheral pulses normal, no pedal edema, no clubbing or cyanosis  RLE w/ brace and ace wrap dressing      Labs:       Lab 09/17/13 0500 09/16/13 0501 09/14/13 0416 09/12/13 0423 09/11/13 1139   GLU 104* 135* 94 -- --   BUN 19 16 13  -- --   CREAT 0.8 0.8 0.8 -- --   CA 8.2 8.2 8.7 -- --   NA 140 143 142 -- --   K 4.1 4.4 4.0 -- --   CL 105 107 103 -- --   CO2 28 24 28  -- --   ALB -- -- -- -- --   PHOS -- -- -- -- --   MG -- -- -- 1.9 1.9   EGFR >60.0 >60.0 >60.0 -- --   PT -- -- 14.4 -- --   PTT -- -- -- -- --   INR -- -- 1.1 -- --       No results found for this basename: AST:3,ALT:3,ALKPHOS:3,ALB:3,BILITOTAL:3,AMY:3,LIP:3 in the last 168 hours      Lab 09/17/13 0500 09/16/13 0501 09/15/13 1239   WBC -- -- 14.26*   HGB 7.9* 8.8* 11.2*   HCT 24.7* 28.1* 34.6*   MCV -- -- 83.4   MCH -- -- 27.0*   MCHC -- -- 32.4   RDW -- -- 15   MPV -- -- 9.5       No results found for this basename:  TSH,FREET3,FREET4 in the last 168 hours        Rads:     Radiology Results (24 Hour)     Procedure Component Value Units Date/Time    X-ray knee right AP and lateral [[id number]] Collected:09/16/13 0828    Order Status:Completed  Updated:09/16/13 0834    Narrative:      RIGHT KNEE: AP and lateral views     CLINICAL STATEMENT: Status post removal of arthroplasty hardware.    COMPARISON: The study is compared to a prior performed on 09/05/2013.    FINDINGS: The patients femoral and tibial prosthetic components have  been removed and cement is now present within the knee joint. A  intramedullary rod bridges the knee, extending from the distal femoral  diaphysis into the proximal tibial diaphysis. Fixation screw is seen  within the medial femoral condyle. Air and fluid is identified within  the knee joint. Postsurgical changes are identified of the patella which  appears somewhat high riding on this exam.      Impression:      Patient status post removal of arthroplasty hardware with fusion of the  knee joint.    Fonnie Mu, MD   09/16/2013 8:30 AM                  Assessment and Plan:      Patient Active Problem List   Diagnosis   . S/p R TKA, readmitted 09/05/13. S/p R TKA I&D and Poly Exchange, s/p Revision- Continue plan per ID and Ortho. Consulted by Plastic Surgery. Additional surgery planned.    Nutrition note reviewed, d/w CM: [employer] and [employer]  added.   . Low back pain - controlled   . Chronic obstructive pulmonary disease- Continue duonebs. +Wheezing. Hx of recent fluid overload with elevated BNP, received lasix. O2 sats wnl, pt appears comfortable laying in bed. D/w with Dr. Vance Gather. Will recheck BNP, BMP, and Magnesium in am.    . Hypertension-  BP stable. Continue current meds and follow   Post-operative Acute Anemia, secondary to blood loss, s/p surgery. Continue Fe/Vit C. Pt s/p transfusions.  Constipation- continue bowel program.    Signed by: Sheryle Hail, NP

## 2013-09-17 NOTE — Plan of Care (Signed)
Problem: Day 1 Post-op- Knee Surgery  Goal: Pain at adequate level as identified by patient  Outcome: Progressing  Pain level 5/10,requiring iv dilaudid and norco prn,also on oxycodone q12hrs  And tramadol.  Goal: Free from Infection  Outcome: Progressing  Pt s/p i/d rt knee with antibiotics spacer ,vss afebrile,antibiotics as ordered  Final cx pending  Goal: Mobility/activity is maintained at optimum level for patient  Outcome: Progressing  Pt was able to take a few steps ,using bsc

## 2013-09-17 NOTE — PT Progress Note (Signed)
Physical Therapy Cancellation Note    Patient: [name]  FAO:[id number]    Unit: I696/E952.84    Patient not seen for physical therapy (11:00 am) secondary to awaiting woundvac application, per Case Manager.

## 2013-09-17 NOTE — Consults (Signed)
Pt seen by Dr. Carolann Littler for Plastic Surgery and he removed right knee VAC and asked to reapply.  Pt has an active bleeding at the proximal base of wound.  Able to get bleeding stopped with Surgifoam and 15 minutes of pressure. VAC not to be reapplied today.   Wound dressed  with gauze, ABD and kerlix.  Immoblizier reapplied.  Spoke with case manager who will contact Dr Ahmed Prima.

## 2013-09-18 ENCOUNTER — Encounter
Admission: AD | Disposition: A | Discharge status: Rehabilitation Facility | Payer: Self-pay | Source: Ambulatory Visit | Attending: Orthopaedic Surgery

## 2013-09-18 ENCOUNTER — Inpatient Hospital Stay: Payer: No Typology Code available for payment source

## 2013-09-18 LAB — CBC AND DIFFERENTIAL
Basophils Absolute Automated: 0.02 (ref 0.00–0.20)
Basophils Automated: 0 %
Eosinophils Absolute Automated: 0.69 (ref 0.00–0.70)
Eosinophils Automated: 4 %
Hematocrit: 24.3 % — ABNORMAL LOW (ref 37.0–47.0)
Hgb: 7.6 g/dL — ABNORMAL LOW (ref 12.0–16.0)
Immature Granulocytes Absolute: 0.05
Immature Granulocytes: 0 %
Lymphocytes Absolute Automated: 2.46 (ref 0.50–4.40)
Lymphocytes Automated: 16 %
MCH: 26.5 pg — ABNORMAL LOW (ref 28.0–32.0)
MCHC: 31.3 g/dL — ABNORMAL LOW (ref 32.0–36.0)
MCV: 84.7 fL (ref 80.0–100.0)
MPV: 9.6 fL (ref 9.4–12.3)
Monocytes Absolute Automated: 1 (ref 0.00–1.20)
Monocytes: 7 %
Neutrophils Absolute: 11.09 — ABNORMAL HIGH (ref 1.80–8.10)
Neutrophils: 73 %
Nucleated RBC: 0 (ref 0–1)
Platelets: 293 (ref 140–400)
RBC: 2.87 — ABNORMAL LOW (ref 4.20–5.40)
RDW: 15 % (ref 12–15)
WBC: 15.26 — ABNORMAL HIGH (ref 3.50–10.80)

## 2013-09-18 LAB — BODY FLUID PATH REVIEW-

## 2013-09-18 LAB — GFR: EGFR: >60

## 2013-09-18 LAB — COMPREHENSIVE METABOLIC PANEL
ALT: <6 U/L (ref 0–55)
AST (SGOT): 17 U/L (ref 5–34)
Albumin/Globulin Ratio: 0.7 — ABNORMAL LOW (ref 0.9–2.2)
Albumin: 2.2 g/dL — ABNORMAL LOW (ref 3.5–5.0)
Alkaline Phosphatase: 88 U/L (ref 40–150)
BUN: 14 mg/dL (ref 7–21)
Bilirubin, Total: 0.5 mg/dL (ref 0.2–1.2)
CO2: 29 mEq/L (ref 22–29)
Calcium: 8.3 mg/dL (ref 7.9–10.6)
Chloride: 105 mEq/L (ref 98–107)
Creatinine: 0.7 mg/dL (ref 0.6–1.0)
Globulin: 3.1 g/dL (ref 2.0–3.6)
Glucose: 92 mg/dL (ref 70–100)
Potassium: 4.1 mEq/L (ref 3.5–5.1)
Protein, Total: 5.3 g/dL — ABNORMAL LOW (ref 6.0–8.3)
Sodium: 141 mEq/L (ref 136–145)

## 2013-09-18 LAB — SEDIMENTATION RATE: Sed Rate: 90 — ABNORMAL HIGH (ref 0–20)

## 2013-09-18 LAB — MAGNESIUM: Magnesium: 1.7 mg/dL (ref 1.6–2.6)

## 2013-09-18 LAB — B-TYPE NATRIURETIC PEPTIDE: B-Natriuretic Peptide: 249 pg/mL — ABNORMAL HIGH (ref 0–100)

## 2013-09-18 LAB — C-REACTIVE PROTEIN: C-Reactive Protein: 7.5 mg/dL — ABNORMAL HIGH (ref 0.0–0.5)

## 2013-09-18 SURGERY — DEBRIDEMENT, MUSCLE FLAP CLOSURE
Anesthesia: General | Laterality: Right

## 2013-09-18 MED ORDER — TIOTROPIUM BROMIDE MONOHYDRATE 18 MCG IN CAPS
18.0000 ug | ORAL_CAPSULE | Freq: Every morning | RESPIRATORY_TRACT | Status: DC
Start: 2013-09-19 — End: 2013-10-03
  Administered 2013-09-19 – 2013-10-03 (×14): 18 ug via RESPIRATORY_TRACT
  Filled 2013-09-18 (×3): qty 5

## 2013-09-18 NOTE — Progress Notes (Addendum)
Pt with moderate amount of serous sanguinous drainage on right knee,dressing was reinforced with abd and kerlix .  Pt to be seen by wound care for possible reinsertion of wound vac

## 2013-09-18 NOTE — Progress Notes (Signed)
Wound Note    Wound vac replaced onto right knee inferior margin, wound measures 8.5 x 3.6 x 3.0cm, serous sanguinous drainage controlled minor bleeding, black granuform placed no leaks secure pressure at 125 mmHg continuous. DSD toproximal incision. Brace applied. Patient was anxious given pain meds prior to procedure tolerated it well.       Loyal Jacobson RN [facility] # [phone]

## 2013-09-18 NOTE — Progress Notes (Signed)
PROGRESS NOTE    Date Time: 09/18/2013 8:26 AM  Patient Name: [name], [name]      Assessment:    Right TKA Irrigation and Debridement and Poly Exchange 09/05/13    Plan:   + Wound drainage,Rresection arthroplasty and antibiotic spacer on Monday 09/15/13  Plan for surgery on 09/22/13, antibiotic spacer exchange and plastic surgery coverage  PT - WBAT in Extension with brace on, No bending knee  DVT: ASA 325 bid  ID: Pan-Sensitive Proteus ssp Final Recommendations Cipro 750 bid for 6 weeks  Will need plastic surgery consult for possible wound coverage  Wound vac in place  D/c planning pending outcome of explantation    Subjective:   Feels well, pain controlled, no current complaints.    Medications:     Current Facility-Administered Medications   Medication Dose Route Frequency   . albuterol-ipratropium  3 mL Nebulization Q4H WA   . amLODIPine  5 mg Oral Daily   . [COMPLETED] ceFAZolin  2 g Intravenous Q8H   . celecoxib  200 mg Oral Daily   . ciprofloxacin  750 mg Oral Q12H SCH   . cloNIDine  0.1 mg Oral Daily   . docusate sodium  100 mg Oral Q12H SCH   . ferrous sulfate 324 mg-ascorbic acid 500 mg combo dose   Oral Daily   . gabapentin  300 mg Oral BID   . hydrochlorothiazide  25 mg Oral Daily   . nebivolol  5 mg Oral QHS   . oxyCODONE  10 mg Oral Q12H SCH   . polycarbophil  1,250 mg Oral Daily   . polyethylene glycol  17 g Oral Daily   . potassium chloride  10 mEq Oral Daily   . Senna  17.2 mg Oral QHS   . traMADol  50 mg Oral Q6H SCH   . [DISCONTINUED] aspirin EC  325 mg Oral Daily       Physical Exam:     Filed Vitals:    09/18/13 0612   BP: 173/72   Pulse: 70   Temp: 97.3 F (36.3 C)   Resp: 17   SpO2: 98%       Intake and Output Summary (Last 24 hours) at Date Time    Intake/Output Summary (Last 24 hours) at 09/18/13 1610  Last data filed at 09/17/13 2226   Gross per 24 hour   Intake     50 ml   Output      0 ml   Net     50 ml       General appearance - alert, well appearing, and in no  distress  Musculoskeletal - Right knee  Dressing with SS drainage along distal 1/2. Wound well approximated without breakdown.  Fires quad/TA/EHL/GSC  SILT SP/DP/TN  WWP distally  Calves soft, nontender bilateral    Physical Therapy:   Pain Score: 3-mild pain (09/17/13 2300)  Weight Bearing Percent: 100  (09/11/13 1400)  Ambulation Distance (Feet):  (4 steps sideways to the right) (09/16/13 1300)  Right Lower Extremity ROM:  (Knee brace locked in extension) (09/16/13 1145)  Left Lower Extremity ROM: within functional limits (09/16/13 1145)         Knee AROM : no knee flexion (09/07/13 0915)    Labs:         Recent CBC   Recent Labs   [facility] 09/18/13 0454    RBC 2.87*    HGB 7.6*    HCT 24.3*    LABPLAT --  Recent BMP   Recent Labs   [facility] 09/18/13 0454    GLU 92    BUN 14    CREAT 0.7    CA 8.3    NA 141    K 4.1    CL 105    CO2 29     Recent PT/INR   No results found for this basename: PTI,INR,COUM,COUMP in the last 24 hours    Rads:   Radiological Procedure reviewed.    Signed by: Garey Ham

## 2013-09-18 NOTE — PT Progress Note (Signed)
Physical Therapy Cancellation Note    Patient: [name]  UEA:[id number]    Unit: J478/G956.21    Patient not seen for physical therapy secondary to awaiting woundvac placement.  Patient stated she was too tired and that she would walk tomorrow.  I am very down right down.  Patient will be put on therapy schedule BID tomorrow.

## 2013-09-18 NOTE — Progress Notes (Signed)
[facility] PROGRESS NOTE    Date Time: 09/18/2013 7:51 AM  Patient Name: [name]        Subjective:   "I guess I'm doing OK." Daughter with patient.    Medications:      Scheduled Meds: PRN Meds:           albuterol-ipratropium 3 mL Nebulization Q4H WA   amLODIPine 5 mg Oral Daily   aspirin EC 325 mg Oral Daily   [COMPLETED] ceFAZolin 2 g Intravenous Q8H   celecoxib 200 mg Oral Daily   ciprofloxacin 750 mg Oral Q12H SCH   cloNIDine 0.1 mg Oral Daily   docusate sodium 100 mg Oral Q12H SCH   ferrous sulfate 324 mg-ascorbic acid 500 mg combo dose  Oral Daily   gabapentin 300 mg Oral BID   hydrochlorothiazide 25 mg Oral Daily   nebivolol 5 mg Oral QHS   oxyCODONE 10 mg Oral Q12H SCH   polycarbophil 1,250 mg Oral Daily   polyethylene glycol 17 g Oral Daily   potassium chloride 10 mEq Oral Daily   Senna 17.2 mg Oral QHS   traMADol 50 mg Oral Q6H SCH         Continuous Infusions:       . sodium chloride 100 mL/hr at 09/15/13 2208         albuterol-ipratropium 3 mL Q4H PRN   bisacodyl 10 mg QD PRN   HYDROcodone-acetaminophen 1 tablet Q4H PRN   HYDROcodone-acetaminophen 2 tablet Q6H PRN   HYDROcodone-acetaminophen 2 tablet Q4H PRN   HYDROmorphone 0.5 mg Q1H PRN   hydrOXYzine 25 mg Q12H PRN   ondansetron 4 mg Q8H PRN   ondansetron 4 mg Q8H PRN   senna-docusate 2 tablet BID PRN   zolpidem 5 mg QHS PRN         I personally reviewed all of the medications      Review of Systems:   A comprehensive review of systems was obtained from chart review and the patient  General ROS: negative  Ophthalmic ROS: negative  Endocrine ROS: negative  Respiratory ROS: no cough, denies shortness of breath, or wheezing, but states she wheezes at home  Cardiovascular ROS: no chest pain or some dyspnea on exertion  Gastrointestinal ROS: no abdominal pain, no N/V  Genito-Urinary ROS: no c/o dysuria, trouble voiding, or hematuria  Musculoskeletal ROS: right knee pain- managed with pain meds  Neurological ROS: no TIA or stroke  symptoms, denies dizziness      Physical Exam:     Filed Vitals:    09/17/13 0844 09/17/13 1700 09/17/13 1926 09/18/13 0612   BP: 156/65 140/67 171/69 173/72   Pulse: 75 75 84 70   Temp: 98.8 F (37.1 C) 98.6 F (37 C) 98.1 F (36.7 C) 97.3 F (36.3 C)   TempSrc:   Oral Oral   Resp: 18 18 18 17    Height:       Weight:       SpO2: 96% 96% 98% 98%         Intake and Output Summary (Last 24 hours) at Date Time    Intake/Output Summary (Last 24 hours) at 09/18/13 0751  Last data filed at 09/17/13 2226   Gross per 24 hour   Intake     50 ml   Output      0 ml   Net     50 ml       General appearance - alert, well appearing, and  in no distress  Mental status - alert, oriented to person, place, and time  Chest - +wheezing, no rales or rhonchi, symmetric air entry  Heart - normal rate, regular rhythm, normal S1, S2, no murmurs  Abdomen - bowel sounds normal, abdomen is soft, nontender, non distended, obese  Neurological - alert, oriented, normal speech, no focal findings or movement disorder noted  Musculoskeletal - laying in bed  Extremities - peripheral pulses normal, no pedal edema, no clubbing or cyanosis  RLE w/ brace and ace wrap dressing      Labs:       Lab 09/18/13 0454 09/17/13 0500 09/16/13 0501 09/14/13 0416 09/12/13 0423 09/11/13 1139   GLU 92 104* 135* -- -- --   BUN 14 19 16  -- -- --   CREAT 0.7 0.8 0.8 -- -- --   CA 8.3 8.2 8.2 -- -- --   NA 141 140 143 -- -- --   K 4.1 4.1 4.4 -- -- --   CL 105 105 107 -- -- --   CO2 29 28 24  -- -- --   ALB 2.2* -- -- -- -- --   PHOS -- -- -- -- -- --   MG 1.7 -- -- -- 1.9 1.9   EGFR >60.0 >60.0 >60.0 -- -- --   PT -- -- -- 14.4 -- --   PTT -- -- -- -- -- --   INR -- -- -- 1.1 -- --         Lab 09/18/13 0454   AST 17   ALT <6   ALKPHOS 88   ALB 2.2*   BILITOTAL 0.5   AMY --   LIP --         Lab 09/18/13 0454 09/17/13 0500 09/16/13 0501 09/15/13 1239   WBC 15.26* -- -- 14.26*   HGB 7.6* 7.9* 8.8* --   HCT 24.3* 24.7* 28.1* --   MCV 84.7 -- -- 83.4   MCH 26.5* -- -- 27.0*    MCHC 31.3* -- -- 32.4   RDW 15 -- -- 15   MPV 9.6 -- -- 9.5       No results found for this basename: TSH,FREET3,FREET4 in the last 168 hours        Rads:     Radiology Results (24 Hour)     ** No Results found for the last 24 hours. **                  Assessment and Plan:      Patient Active Problem List   Diagnosis   . S/p R TKA, readmitted 09/05/13. S/p R TKA I&D and Poly Exchange, s/p Revision- Continue plan per ID and Ortho. Consulted by Plastic Surgery. Additional surgery planned.    Nutrition note reviewed, d/w CM: Boost Breeze and Juven added.   . Low back pain - controlled   . Chronic obstructive pulmonary disease- On duonebs. +Wheezing, but improved today. Hx of recent fluid overload with elevated BNP, received lasix. O2 sats wnl/improved, pt appears comfortable laying in bed. D/w with Dr. Vance Gather. Elevated BNP- improving, BMP, labs stable.  CXR : Stable cardiomegaly, no pulmonary vascular congestion.   Will d/c duoneb and resume pre-op med spiriva (1st dose in am).   . Hypertension-  BP stable. Continue current meds and follow   Post-operative Acute Anemia, secondary to blood loss, s/p surgery. Continue Fe/Vit C. Pt s/p transfusions.  Constipation- continue bowel program.    Signed by: Vikki Ports  Serafina Mitchell, NP

## 2013-09-19 NOTE — Progress Notes (Signed)
PROGRESS NOTE    Date Time: 09/19/2013 6:37 AM  Patient Name: [name], [name]      Assessment:    Right TKA Irrigation and Debridement and Poly Exchange 09/05/13    Plan:   + Wound drainage,Rresection arthroplasty and antibiotic spacer on Monday 09/15/13  Plan for surgery on 09/22/13, antibiotic spacer exchange and plastic surgery coverage  PT - WBAT in Extension with brace on, No bending knee  DVT: ASA 325 bid  ID: Pan-Sensitive Proteus ssp Final Recommendations Cipro 750 bid for 6 weeks  Will need plastic surgery consult for possible wound coverage  Wound vac in place  D/c planning pending outcome of explantation    Subjective:   Feels well, pain controlled, no current complaints.    Medications:     Current Facility-Administered Medications   Medication Dose Route Frequency   . [EXPIRED] albuterol-ipratropium  3 mL Nebulization Q4H WA   . amLODIPine  5 mg Oral Daily   . celecoxib  200 mg Oral Daily   . ciprofloxacin  750 mg Oral Q12H SCH   . cloNIDine  0.1 mg Oral Daily   . docusate sodium  100 mg Oral Q12H SCH   . ferrous sulfate 324 mg-ascorbic acid 500 mg combo dose   Oral Daily   . gabapentin  300 mg Oral BID   . hydrochlorothiazide  25 mg Oral Daily   . nebivolol  5 mg Oral QHS   . oxyCODONE  10 mg Oral Q12H SCH   . polycarbophil  1,250 mg Oral Daily   . polyethylene glycol  17 g Oral Daily   . potassium chloride  10 mEq Oral Daily   . Senna  17.2 mg Oral QHS   . tiotropium  18 mcg Inhalation QAM   . traMADol  50 mg Oral Q6H SCH   . [DISCONTINUED] aspirin EC  325 mg Oral Daily       Physical Exam:     Filed Vitals:    09/19/13 0615   BP: 130/63   Pulse: 68   Temp: 97 F (36.1 C)   Resp: 19   SpO2: 94%       Intake and Output Summary (Last 24 hours) at Date Time    Intake/Output Summary (Last 24 hours) at 09/19/13 8119  Last data filed at 09/18/13 2200   Gross per 24 hour   Intake      0 ml   Output    200 ml   Net   -200 ml       General appearance - alert, well appearing, and in no distress  Musculoskeletal  - Right knee  Dressing with SS drainage along distal 1/2. Wound well approximated without breakdown.  Fires quad/TA/EHL/GSC  SILT SP/DP/TN  WWP distally  Calves soft, nontender bilateral    Physical Therapy:   Pain Score: 3-mild pain (09/19/13 0615)  Weight Bearing Percent: 100  (09/11/13 1400)  Ambulation Distance (Feet):  (4 steps sideways to the right) (09/16/13 1300)  Right Lower Extremity ROM:  (Knee brace locked in extension) (09/16/13 1145)  Left Lower Extremity ROM: within functional limits (09/16/13 1145)         Knee AROM : no knee flexion (09/07/13 0915)    Labs:         Recent CBC   No results found for this basename: WHITEBLOODCE,RBC,HGB,HCT,,LABPLAT, NRBCA, REFLX, ANRBA in the last 24 hours  Recent BMP   No results found for this basename: GLU,BUN,CREAT,CA,NA,K,CL,CO2,AGAP in the last 24  hours  Recent PT/INR   No results found for this basename: PTI,INR,COUM,COUMP in the last 24 hours    Rads:   Radiological Procedure reviewed.    Signed by: Garey Ham

## 2013-09-19 NOTE — Progress Notes (Addendum)
Patient is AAOX3.  Patient has some stomach upset and is complaining about constipation, but is not in any pain with her knee.  She was told that she is scheduled for surgery with Dr Campbell Lerner for 09/22/13 at 2:30pm.  Patient's chart was checked to make sure that aspirin has been removed from her medication list and she is not currently on any blood thinners.  Patient had no questions or concerns other than wanting to get out of the hospital as soon as possible.

## 2013-09-19 NOTE — PT Progress Note (Signed)
Physical Therapy Note    Physical Therapy Treatment    Patient:  [name]        MRN#:  [id number]  Unit:  [facility]        Room/Bed:  M442/M442.01    Time of treatment: Start Time: 1610 Stop Time: 1635  Time Calculation (min): 25 min  PT Received On: 09/19/13    Treatment #: PT Visit Number: 3/7    Precautions  Weight Bearing Status: RLE WBAT  Weight Bearing Percent: 100   Total Knee Replacement: hinge brace;at all times  Precaution Instructions Given to Patient: Yes  Active and passive exercise.    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      Patient's medical condition is appropriate for Physical Therapy  intervention at this time.  Patient cleared by nurse to participate in PT session, nursing reports patient needs to use the BR due to gas pains.    Subjective:   Patient is agreeable to participation in the therapy session. Family and/or guardian are agreeable to patient's participation in the therapy session.     Pain Assessment  Pain Assessment: Numeric Scale (0-10)  Pain Score: 5-moderate pain  POSS Score: Awake and Alert  Pain Location: Abdomen  Pain Orientation: Right  Pain Descriptors:  (gas pains, has to have a bowel movement)  Pain Frequency: Increases with movement  Effect of Pain on Daily Activities: moderate  Patient's Stated Comfort Functional Goal: 1-mild pain  Pain Intervention(s): Ambulation/increased activity;Other (Comment) (to the bathroom)  Multiple Pain Sites: Yes          Objective:  Patient is in bed seen 4 Days Post-Op with IV, dressings and wound vac.                                       Sensation is intact       Cognition  Arousal/Alertness: Appropriate responses  to stimuli  Following Commands: Follows all commands and directions without difficulty      Functional Mobility  Supine to Sit: Stand by assistance  Scooting to EOB: Min assist to right  Sit to Stand: Stand by assistance;Min assist (assist to manage the wound vac)  Stand to Sit: Min assist (to manage the wound vac)         Locomotion  Ambulation: minimal assistance;with front-wheeled walker  Ambulation Distance (Feet): 20 Feet (bed to toilet)  Pattern: decreased cadence;decreased step length    Therapeutic Exercise  Straight Leg Raise: 10aa  Quad Sets: 25  Glute Sets: 25  Hip Abduction: 25aa  Ankle Pumps: 25       Self Care and Home Management:  Pt did not perform self care activities at this time.    Educated the patient to role of physical therapy, plan of care, goals  of therapy and HEP, safety with mobility and ADLs, weight bearing precautions.    Patient is seated safely on the toilet with call button near with all needs provided and call bell within reach. RN notified of session outcome, including patient will call for help to ambulate back to bed when finished on the toilet.  Assessment:   Assessment  Assessment: Decreased LE ROM;Decreased LE strength;Decreased functional mobility  Prognosis: Good;With continued PT status post acute discharge.           Goals per Eval/Re-eval:     Goals  Goal Formulation: With patient/family  Time for Goal Acheivement: 7 visits  Goals: Select goal  Pt Will Go Supine To Sit: With minimal assist  Pt Will Perform Sit to Stand: With minimal assist  Pt Will Transfer to Toilet: With minimal assist  Pt Will Transfer to Tub/Shower: With minimal assist  PT Will Demonstrate Car Transfer Technique: With minimal assist  Pt Will Ambulate: 31-50 feet;with rolling walker;With minimal assist  Pt Will Go Up / Down Stairs: 3-5 stairs;With minimal assist  Pt Will Perform Home Exer Program: With minimal assist  Pt Will Perform LE Dressing w/Device: With min  assist          Plan:  Recommendation  Discharge Recommendation: Home with home health PT  DME Recommended for Discharge:  (None-has equipment at home)  PT - Next Visit Recommendation: 09/20/13  PT Frequency: 7x/wk    Continue plan of care.      Signature: Howie Ill, PT 09/19/2013 4:52 PM

## 2013-09-19 NOTE — Progress Notes (Signed)
[facility] PROGRESS NOTE    Date Time: 09/19/2013 8:33 AM  Patient Name: [name]        Subjective:   "I'm doing OK." Daughter with patient.    Medications:      Scheduled Meds: PRN Meds:           [EXPIRED] albuterol-ipratropium 3 mL Nebulization Q4H WA   amLODIPine 5 mg Oral Daily   celecoxib 200 mg Oral Daily   ciprofloxacin 750 mg Oral Q12H SCH   cloNIDine 0.1 mg Oral Daily   docusate sodium 100 mg Oral Q12H SCH   ferrous sulfate 324 mg-ascorbic acid 500 mg combo dose  Oral Daily   gabapentin 300 mg Oral BID   hydrochlorothiazide 25 mg Oral Daily   nebivolol 5 mg Oral QHS   oxyCODONE 10 mg Oral Q12H SCH   polycarbophil 1,250 mg Oral Daily   polyethylene glycol 17 g Oral Daily   potassium chloride 10 mEq Oral Daily   Senna 17.2 mg Oral QHS   tiotropium 18 mcg Inhalation QAM   traMADol 50 mg Oral Q6H SCH         Continuous Infusions:       . sodium chloride 100 mL/hr at 09/15/13 2208         [EXPIRED] albuterol-ipratropium 3 mL Q4H PRN   bisacodyl 10 mg QD PRN   HYDROcodone-acetaminophen 1 tablet Q4H PRN   HYDROcodone-acetaminophen 2 tablet Q6H PRN   HYDROcodone-acetaminophen 2 tablet Q4H PRN   HYDROmorphone 0.5 mg Q1H PRN   hydrOXYzine 25 mg Q12H PRN   ondansetron 4 mg Q8H PRN   ondansetron 4 mg Q8H PRN   senna-docusate 2 tablet BID PRN   zolpidem 5 mg QHS PRN         I personally reviewed all of the medications      Review of Systems:   A comprehensive review of systems was obtained from chart review and the patient  General ROS: negative  Ophthalmic ROS: negative  Endocrine ROS: negative  Respiratory ROS: no cough, denies shortness of breath, states she wheezes at home  Cardiovascular ROS: no chest pain or some dyspnea on exertion  Gastrointestinal ROS: no abdominal pain, no N/V  Genito-Urinary ROS: no c/o dysuria, trouble voiding, or hematuria  Musculoskeletal ROS: right knee pain- managed with pain meds  Neurological ROS: no TIA or stroke symptoms, denies dizziness      Physical Exam:      Filed Vitals:    09/18/13 0900 09/18/13 1645 09/18/13 1936 09/19/13 0615   BP: 169/73 154/70 165/67 130/63   Pulse: 88 76 79 68   Temp: 98.3 F (36.8 C) 97.3 F (36.3 C) 98.1 F (36.7 C) 97 F (36.1 C)   TempSrc: Oral  Oral Oral   Resp: 18 18 18 19    Height:       Weight:       SpO2: 99%   94%         Intake and Output Summary (Last 24 hours) at Date Time    Intake/Output Summary (Last 24 hours) at 09/19/13 9604  Last data filed at 09/18/13 2200   Gross per 24 hour   Intake      0 ml   Output    200 ml   Net   -200 ml       General appearance - alert, well appearing, and in no distress  Mental status - alert, oriented to person, place, and time  Chest - +wheezing- mild, no rales or rhonchi, symmetric air entry  Heart - normal rate, regular rhythm, normal S1, S2, no murmurs  Abdomen - bowel sounds normal, abdomen is soft, nontender, non distended, obese  Neurological - alert, oriented, normal speech, no focal findings or movement disorder noted  Musculoskeletal - s/p therapy, sitting in chair  Extremities - peripheral pulses normal, no pedal edema, no clubbing or cyanosis  RLE w/ brace and ace wrap dressing      Labs:       Lab 09/18/13 0454 09/17/13 0500 09/16/13 0501 09/14/13 0416   GLU 92 104* 135* --   BUN 14 19 16  --   CREAT 0.7 0.8 0.8 --   CA 8.3 8.2 8.2 --   NA 141 140 143 --   K 4.1 4.1 4.4 --   CL 105 105 107 --   CO2 29 28 24  --   ALB 2.2* -- -- --   PHOS -- -- -- --   MG 1.7 -- -- --   EGFR >60.0 >60.0 >60.0 --   PT -- -- -- 14.4   PTT -- -- -- --   INR -- -- -- 1.1         Lab 09/18/13 0454   AST 17   ALT <6   ALKPHOS 88   ALB 2.2*   BILITOTAL 0.5   AMY --   LIP --         Lab 09/18/13 0454 09/17/13 0500 09/16/13 0501 09/15/13 1239   WBC 15.26* -- -- 14.26*   HGB 7.6* 7.9* 8.8* --   HCT 24.3* 24.7* 28.1* --   MCV 84.7 -- -- 83.4   MCH 26.5* -- -- 27.0*   MCHC 31.3* -- -- 32.4   RDW 15 -- -- 15   MPV 9.6 -- -- 9.5       No results found for this basename: TSH,FREET3,FREET4 in the last 168  hours        Rads:     Radiology Results (24 Hour)     Procedure Component Value Units Date/Time    XR Chest 2 Views [[id number]] Collected:09/18/13 1137    Order Status:Completed  Updated:09/18/13 1142    Narrative:    CLINICAL INDICATION: COPD, wheezing    COMPARISON: 09/11/2013    INTERPRETATION: Frontal and lateral views of the chest were obtained..   There is stable mild cardiomegaly. Pulmonary vasculature is normal.  Lungs are clear. There is no focal infiltrate or pleural effusion.          Impression:     Stable mild cardiomegaly. No acute cardiopulmonary process.    Wyatt Portela, MD   09/18/2013 11:38 AM                  Assessment and Plan:      Patient Active Problem List   Diagnosis   . S/p R TKA, readmitted 09/05/13. S/p R TKA I&D and Poly Exchange, s/p Revision- Continue plan per ID and Ortho. Consulted by Plastic Surgery. Additional surgery planned.    Nutrition note reviewed, d/w CM: Boost Breeze and Juven added.   . Low back pain - controlled   . Chronic obstructive pulmonary disease- On duonebs. +Wheezing, but improved today. Hx of recent fluid overload with elevated BNP, received lasix. O2 sats wnl/improved, pt appears comfortable laying in bed. D/w with Dr. Vance Gather. Elevated BNP- improving, BMP, labs stable.  CXR : Stable cardiomegaly, no pulmonary vascular congestion.  Will d/c duoneb and resume pre-op med spiriva (1st dose in am).   . Hypertension-  BP stable. Continue current meds and follow   Post-operative Acute Anemia, secondary to blood loss, s/p surgery. Continue Fe/Vit C. Pt s/p transfusions. Additional transfusions planned for over weekend per Ortho. Recommend giving Lasix 20 mg IV in between units.      Signed by: Sheryle Hail, NP

## 2013-09-19 NOTE — Plan of Care (Signed)
Problem: Potential for Inadequate gas exchange  Goal: Mobility/Activity is maintained at optimal level for patient  Outcome: Progressing  Patient gets OOB to bedside commode, slight SOB on exertion noted. Breath sounds diminished with occasional expiratory wheezing. O2 sat 97% on room air. Pt receiving nebs treatment.Will continue plan of care.

## 2013-09-19 NOTE — PT Progress Note (Signed)
Physical Therapy Treatment    Patient:  [name]        MRN#:  [id number]  Unit:  [facility]        Room/Bed:  M442/M442.01    Time of treatment: Start Time: 1000 Stop Time: 1032  Time Calculation (min): 32 min  PT Received On: 09/19/13    Treatment #: PT Visit Number: 2/7    Precautions  Weight Bearing Status: RLE WBAT  Weight Bearing Percent: 100     Medical Diagnosis:   Infection of total knee replacement, initial encounter [996.66, V43.65] (INFECTED RT KNEE)  Total knee replacement status [V43.65] (Total knee replacement status)  Surgical Procedure:Right   Procedure(s):  ARTHROPLASTY, KNEE, RESECTION performed by  Lane Hacker, MD on 09/05/2013 - 09/15/2013.      Patient's medical condition is appropriate for Physical Therapy  intervention at this time.  Patient cleared by nurse to participate in PT session, nursing reports woundvac has been applied.    Subjective: I will try my best.(patient tearful)  Patient is agreeable to participation in the therapy session.     Pain Assessment  Pain Assessment: Numeric Scale (0-10)  Pain Score: 4-moderate pain  POSS Score: Awake and Alert  Pain Location: Knee  Pain Orientation: Right  Pain Descriptors: Constant  Pain Frequency: Increases with movement  Effect of Pain on Daily Activities: moderate  Patient's Stated Comfort Functional Goal: 1-mild pain  Pain Intervention(s): Repositioned  Multiple Pain Sites: Yes          Objective:  Patient is in bed seen 4 Days Post-Op with dressings and woundvac.                                       Sensation is intact              Functional Mobility  Supine to Sit: Mod assist to right  Sit to Stand: Min assist  Stand to Sit: Mod assist         Locomotion  Ambulation: moderate assistance;with front-wheeled walker  Ambulation Distance (Feet): 20 Feet  Pattern: shuffle;decreased step length;decreased cadence    Therapeutic Exercise  Straight Leg Raise: 5aa  Quad Sets: 25  Glute Sets: 25  Hip Abduction: 10  aa  Ankle Pumps: 25       Self Care and Home Management:  Pt did not perform self care activities at this time.    Educated the patient to role of physical therapy, plan of care, goals  of therapy and safety with mobility and ADLs, weight bearing precautions.    Patient is seated in wheelchair  with all needs provided and call bell within reach. RN notified of session outcome, including patient sitting up in w/c in room with family.  Patient instructed in use of incentive spirometer.  Patient could get up to 800.    Assessment:   Assessment  Assessment: Decreased LE strength;Decreased LE ROM;Decreased endurance/activity tolerance;Gait impairment  Prognosis: Good;With continued PT status post acute discharge.           Goals per Eval/Re-eval:     Goals  Goal Formulation: With patient/family  Time for Goal Acheivement: 7 visits  Goals: Select goal  Pt Will Go Supine To Sit: With minimal assist  Pt Will Perform Sit to Stand: With minimal assist  Pt Will Transfer to Toilet: With minimal assist  Pt  Will Transfer to Tub/Shower: With minimal assist  PT Will Demonstrate Car Transfer Technique: With minimal assist  Pt Will Ambulate: 31-50 feet;with rolling walker;With minimal assist  Pt Will Go Up / Down Stairs: 3-5 stairs;With minimal assist  Pt Will Perform Home Exer Program: With minimal assist  Pt Will Perform LE Dressing w/Device: With min assist          Plan:  Recommendation  Discharge Recommendation:  (TBA)  DME Recommended for Discharge:  (None-has equipment at home)  PT - Next Visit Recommendation: 09/19/13  PT Frequency: 7x/wk    Continue plan of care.      Signature: Louann Liv, PTA 09/19/2013 10:49 AM

## 2013-09-19 NOTE — Progress Notes (Signed)
No growth. Set of labs Monday. Will go back to IV Ancef then. Would also cover w/ a gram of vanco.preop. Will f/u then.

## 2013-09-20 LAB — TYPE AND SCREEN
AB Screen Gel: NEGATIVE
ABO Rh: B POS

## 2013-09-20 LAB — PREPARE RBC
RBC Leukoreduced: TRANSFUSED
RBC Leukoreduced: TRANSFUSED

## 2013-09-20 MED ORDER — DIPHENHYDRAMINE HCL 50 MG/ML IJ SOLN
25.0000 mg | INTRAMUSCULAR | Status: DC | PRN
Start: 2013-09-20 — End: 2013-09-20

## 2013-09-20 MED ORDER — ALBUTEROL SULFATE (2.5 MG/3ML) 0.083% IN NEBU
2.5000 mg | INHALATION_SOLUTION | RESPIRATORY_TRACT | Status: DC | PRN
Start: 2013-09-20 — End: 2013-10-03

## 2013-09-20 MED ORDER — FUROSEMIDE 10 MG/ML IJ SOLN
20.0000 mg | Freq: Once | INTRAMUSCULAR | Status: AC
Start: 2013-09-20 — End: 2013-09-20
  Administered 2013-09-20: 20 mg via INTRAVENOUS
  Filled 2013-09-20: qty 4

## 2013-09-20 MED ORDER — DIPHENHYDRAMINE HCL 25 MG PO CAPS
25.0000 mg | ORAL_CAPSULE | Freq: Four times a day (QID) | ORAL | Status: DC | PRN
Start: 2013-09-20 — End: 2013-10-03

## 2013-09-20 NOTE — Progress Notes (Addendum)
[facility]IC[facility] PROGRESS NOTE    Date Time: 09/20/2013 7:33 AM  Patient Name: [name]        Subjective:   "I'm doing OK." Daughter with patient.    Medications:      Scheduled Meds: PRN Meds:           amLODIPine 5 mg Oral Daily   celecoxib 200 mg Oral Daily   ciprofloxacin 750 mg Oral Q12H SCH   cloNIDine 0.1 mg Oral Daily   docusate sodium 100 mg Oral Q12H SCH   ferrous sulfate 324 mg-ascorbic acid 500 mg combo dose  Oral Daily   gabapentin 300 mg Oral BID   hydrochlorothiazide 25 mg Oral Daily   nebivolol 5 mg Oral QHS   oxyCODONE 10 mg Oral Q12H SCH   polycarbophil 1,250 mg Oral Daily   polyethylene glycol 17 g Oral Daily   potassium chloride 10 mEq Oral Daily   Senna 17.2 mg Oral QHS   tiotropium 18 mcg Inhalation QAM   traMADol 50 mg Oral Q6H SCH         Continuous Infusions:       . [DISCONTINUED] sodium chloride 100 mL/hr at 09/15/13 2208         bisacodyl 10 mg QD PRN   HYDROcodone-acetaminophen 1 tablet Q4H PRN   HYDROcodone-acetaminophen 2 tablet Q6H PRN   HYDROcodone-acetaminophen 2 tablet Q4H PRN   HYDROmorphone 0.5 mg Q1H PRN   hydrOXYzine 25 mg Q12H PRN   ondansetron 4 mg Q8H PRN   ondansetron 4 mg Q8H PRN   senna-docusate 2 tablet BID PRN   zolpidem 5 mg QHS PRN         I personally reviewed all of the medications      Review of Systems:   A comprehensive review of systems was obtained from chart review and the patient  General ROS: negative  Ophthalmic ROS: negative  Endocrine ROS: negative  Respiratory ROS: no cough, denies shortness of breath, states she wheezes at home  Cardiovascular ROS: no chest pain or some dyspnea on exertion  Gastrointestinal ROS: no abdominal pain, no N/V  Genito-Urinary ROS: no c/o dysuria, trouble voiding, or hematuria  Musculoskeletal ROS: right knee pain- managed with pain meds  Neurological ROS: no TIA or stroke symptoms, denies dizziness      Physical Exam:     Filed Vitals:    09/19/13 0935 09/19/13 1624 09/19/13 1928 09/20/13 0601   BP: 146/65  163/70 135/60 141/64   Pulse: 77 81 81 69   Temp: 97.3 F (36.3 C) 96.8 F (36 C) 97.2 F (36.2 C) 98.1 F (36.7 C)   TempSrc:       Resp: 19 20 18 18    Height:       Weight:       SpO2: 99%  96% 95%         Intake and Output Summary (Last 24 hours) at Date Time    Intake/Output Summary (Last 24 hours) at 09/20/13 0981  Last data filed at 09/19/13 1159   Gross per 24 hour   Intake    200 ml   Output      0 ml   Net    200 ml       General appearance - alert, well appearing, and in no distress  Mental status - alert, oriented to person, place, and time  Chest -mild expiratory wheeze (posteriorly), clear ant., no rales or rhonchi, symmetric air entry  Heart - normal rate, regular rhythm, normal S1, S2, no murmurs  Abdomen - bowel sounds normal, abdomen is soft, nontender, non distended, obese  Neurological - alert, oriented, normal speech, no focal findings or movement disorder noted  Musculoskeletal - s/p therapy, sitting in chair  Extremities - peripheral pulses normal, no pedal edema, no clubbing or cyanosis  RLE w/ brace and ace wrap dressing      Labs:       Lab 09/18/13 0454 09/17/13 0500 09/16/13 0501 09/14/13 0416   GLU 92 104* 135* --   BUN 14 19 16  --   CREAT 0.7 0.8 0.8 --   CA 8.3 8.2 8.2 --   NA 141 140 143 --   K 4.1 4.1 4.4 --   CL 105 105 107 --   CO2 29 28 24  --   ALB 2.2* -- -- --   PHOS -- -- -- --   MG 1.7 -- -- --   EGFR >60.0 >60.0 >60.0 --   PT -- -- -- 14.4   PTT -- -- -- --   INR -- -- -- 1.1         Lab 09/18/13 0454   AST 17   ALT <6   ALKPHOS 88   ALB 2.2*   BILITOTAL 0.5   AMY --   LIP --         Lab 09/18/13 0454 09/17/13 0500 09/16/13 0501 09/15/13 1239   WBC 15.26* -- -- 14.26*   HGB 7.6* 7.9* 8.8* --   HCT 24.3* 24.7* 28.1* --   MCV 84.7 -- -- 83.4   MCH 26.5* -- -- 27.0*   MCHC 31.3* -- -- 32.4   RDW 15 -- -- 15   MPV 9.6 -- -- 9.5       No results found for this basename: TSH,FREET3,FREET4 in the last 168 hours        Rads:     Radiology Results (24 Hour)     ** No Results found  for the last 24 hours. **                  Assessment and Plan:      Patient Active Problem List   Diagnosis   . S/p R TKA, readmitted 09/05/13. S/p R TKA I&D and Poly Exchange, s/p Revision- Continue plan per ID and Ortho. Consulted by Plastic Surgery. Additional surgery planned.    Nutrition note reviewed, d/w CM: Boost Breeze and Juven added.   . Low back pain - controlled   . Chronic obstructive pulmonary disease- On duonebs. +Wheezing, but improved today. Hx of recent fluid overload with elevated BNP, received lasix. O2 sats wnl/improved, pt appears comfortable laying in bed.   Continue current meds. Will add prn albuterol nebulizer   . Hypertension-  BP stable. Continue current meds and follow   Post-operative Acute Anemia, secondary to blood loss, s/p surgery. Continue Fe/Vit C. Pt s/p transfusions. Additional transfusions planned per Ortho. Recommend giving Lasix 20 mg IV in between units.      Signed by: Sheryle Hail, NP

## 2013-09-20 NOTE — Progress Notes (Signed)
HH=7.6/24.3.  Patient to receive 2 units PRBC.  1st unit started, no problems at this time.  Discussed possible transfusion reactions with patient.  Continue to monitor transfusion status.

## 2013-09-20 NOTE — PT Progress Note (Signed)
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      Patient's medical condition is appropriate for Physical Therapy  intervention at this time.  Patient cleared by nurse to participate in PT session, nursing reports pt to receive blood later in the morning.    Subjective: Pt without complaints.  Patient is agreeable to participation in the therapy session. Family and/or guardian are agreeable to patient's participation in the therapy session.     Pain Assessment  Pain Assessment: Numeric Scale (0-10)  Pain Score: 1-mild pain  Pain Location: Knee          Objective:  Patient is in bed seen 5 Days Post-Op with dressings and wound vac    H/H 7.6/24.3.                  Functional Mobility  Supine to Sit: Stand by assistance (with rail and head of bed elevated)  Scooting to EOB: Stand by assistance  Sit to Stand:  (cga/min)  Stand to Sit:  (cg/min)         Locomotion  Ambulation: minimal assistance;with front-wheeled walker (wound vac in tow)  Ambulation Distance (Feet): 25 Feet  Pattern: decreased cadence;decreased step length;R decreased stance time    Therapeutic Exercise  Straight Leg Raise: 10 w/ aa  Quad Sets: 25  Glute Sets: 25  Hip Abduction: 25 w/ a/aa  Ankle Pumps: 25       Self Care  and Home Management:  Pt did not perform self care activities at this time.    Educated the patient to role of physical therapy, plan of care, goals  of therapy and safety with mobility and ADLs.    Patient is seated in w/c in room with daughter present and  with all needs provided and call bell within reach. RN notified of session outcome.    Assessment:   Assessment  Making gradual progress with goals.  Limitation independence due to brace and wound vac.    Assessment: Decreased LE ROM;Decreased LE strength;Decreased endurance/activity tolerance;Decreased functional mobility;Decreased balance;Gait impairment  Prognosis: Good;With continued PT status post acute discharge.           Goals per Eval/Re-eval:     Goals  Goal Formulation: With patient;With patient/family  Time for Goal Acheivement: 7 visits  Goals: Select goal  Pt Will Go Supine To Sit: With minimal assist  Pt Will Perform Sit to Stand: With minimal assist;Goal met;New goal;With stand by assist (09/20/13)  Pt Will Transfer to Toilet: With minimal assist  Pt Will Transfer to Tub/Shower: With minimal assist  PT Will Demonstrate Car Transfer Technique: With minimal assist  Pt Will Ambulate: 31-50 feet;with  rolling walker;With minimal assist  Pt Will Go Up / Down Stairs: 3-5 stairs;With minimal assist  Pt Will Perform Home Exer Program: With minimal assist  Pt Will Perform LE Dressing w/Device: With min assist          Plan:  Recommendation  Discharge Recommendation: Home with supervision;Home with home health PT  DME Recommended for Discharge:  (None-has equipment at home)  PT - Next Visit Recommendation: 09/20/13  PT Frequency: 7x/wk    Continue plan of care.      Signature: Erasmo Downer, PT 09/20/2013 9:05 AM

## 2013-09-20 NOTE — Plan of Care (Signed)
Problem: Potential for Inadequate gas exchange  Goal: Mobility/Activity is maintained at optimal level for patient  Outcome: Progressing  Pt with hx of copd,mild SOB noted with exertion when walking to bathroom ,lungs with decreased bs,,pox 96% on ra ,h/h 7.6/24.3 on 12/11 ,pt encouraged to space activities,bsc provided for pt use ,respiratory ttt per life support,pt on iron po for anemia.

## 2013-09-20 NOTE — Progress Notes (Signed)
PROGRESS NOTE    Date Time: 09/20/2013 9:33 AM  Patient Name: [name], [name]      Assessment:    Right TKA Irrigation and Debridement and Poly Exchange 09/05/13    Plan:   + Wound drainage,Rresection arthroplasty and antibiotic spacer on Monday 09/15/13  Plan for surgery on 09/22/13, antibiotic spacer exchange and plastic surgery coverage  Transfuse 2U PRBC 09/20/13  PT - WBAT in Extension with brace on, No bending knee  DVT: ASA 325 bid  ID: Pan-Sensitive Proteus ssp Final Recommendations Cipro 750 bid for 6 weeks  Wound vac in place - will change 09/21/13  D/c planning pending outcome of explantation    Subjective:   Feels well, pain controlled, no current complaints.    Medications:     Current Facility-Administered Medications   Medication Dose Route Frequency   . amLODIPine  5 mg Oral Daily   . celecoxib  200 mg Oral Daily   . ciprofloxacin  750 mg Oral Q12H SCH   . cloNIDine  0.1 mg Oral Daily   . docusate sodium  100 mg Oral Q12H SCH   . ferrous sulfate 324 mg-ascorbic acid 500 mg combo dose   Oral Daily   . furosemide  20 mg Intravenous Once   . gabapentin  300 mg Oral BID   . hydrochlorothiazide  25 mg Oral Daily   . nebivolol  5 mg Oral QHS   . oxyCODONE  10 mg Oral Q12H SCH   . polycarbophil  1,250 mg Oral Daily   . polyethylene glycol  17 g Oral Daily   . potassium chloride  10 mEq Oral Daily   . Senna  17.2 mg Oral QHS   . tiotropium  18 mcg Inhalation QAM   . traMADol  50 mg Oral Q6H SCH       Physical Exam:     Filed Vitals:    09/20/13 0807   BP: 137/63   Pulse: 71   Temp:    Resp: 20   SpO2: 96%       Intake and Output Summary (Last 24 hours) at Date Time    Intake/Output Summary (Last 24 hours) at 09/20/13 2956  Last data filed at 09/19/13 1159   Gross per 24 hour   Intake    200 ml   Output      0 ml   Net    200 ml       General appearance - alert, well appearing, and in no distress  Musculoskeletal - Right knee  Dressing with SS drainage along distal 1/2. Wound well approximated without  breakdown.  Fires quad/TA/EHL/GSC  SILT SP/DP/TN  WWP distally  Calves soft, nontender bilateral    Physical Therapy:   Pain Score: 0-No pain (09/20/13 0400)  Weight Bearing Percent: 100  (09/20/13 0800)  Ambulation Distance (Feet): 25 Feet (09/20/13 0800)  Right Lower Extremity ROM:  (Knee brace locked in extension) (09/16/13 1145)  Left Lower Extremity ROM: within functional limits (09/16/13 1145)         Knee AROM : no knee flexion (09/07/13 0915)    Labs:         Recent CBC   No results found for this basename: WHITEBLOODCE,RBC,HGB,HCT,,LABPLAT, NRBCA, REFLX, ANRBA in the last 24 hours  Recent BMP   No results found for this basename: GLU,BUN,CREAT,CA,NA,K,CL,CO2,AGAP in the last 24 hours  Recent PT/INR   No results found for this basename: PTI,INR,COUM,COUMP in the last 24 hours  Rads:   Radiological Procedure reviewed.    Signed by: Garey Ham

## 2013-09-20 NOTE — PT Progress Note (Signed)
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      Patient's medical condition is appropriate for Physical Therapy  intervention at this time.  Patient cleared by nurse to participate in PT session.    Subjective: Pt states she is tired from being up since 7 am and sitting in chair since 9am.  States she would prefer just bed exercises today.  Pt reports just 3 steps from garage to get into house, though family will check at home if pt could use chair lift in house to negotiate flight of stairs at home, may be limited due to pt's brace and leg lock in extension.  Patient is agreeable to participation in the therapy session.     Pain Assessment  Pain Assessment: Numeric Scale (0-10)  Pain Score: 1-mild pain  Pain Location: Knee          Objective:  Patient is seated in w/c after just returning from bathroom with nursing staff seen 5 Days Post-Op with dressings and hinged knee brace and wound vac.     H/H 7.6/24.3 (pending blood transfusion)             Functional Mobility  Supine to Sit: Stand by assistance (with rail and head of bed elevated)  Scooting to EOB: Stand by assistance  Sit to Supine:  (cg/min due to brace)  Sit to Stand:  (cga/min)  Stand to Sit:  (cg/min)    Transfers  Chair  to Bed: Stand by assistance (SPT with RW)    Locomotion  Ambulation: minimal assistance;with front-wheeled walker (wound vac in tow)  Ambulation Distance (Feet): 25 Feet  Pattern: decreased cadence;decreased step length;R decreased stance time    Therapeutic Exercise  Straight Leg Raise: 10 w/ aa  Quad Sets: 25  Glute Sets: 25  Hip Abduction: 25 w/ a/aa  Ankle Pumps: 25       Self Care and Home Management:  Pt did not perform self care activities at this time.    Educated the patient to role of physical therapy, plan of care, goals  of therapy and HEP, safety with mobility and ADLs.    Patient is in bed with all needs provided and call bell within reach. RN notified of session outcome.    Assessment:   Assessment  Pt motivated but slight decr in activity tolerance this second session due to being up in chair and pt still pending blood transfusion for low H/H.    Assessment: Decreased LE ROM;Decreased LE strength;Decreased endurance/activity tolerance;Decreased functional mobility;Decreased balance;Gait impairment  Prognosis: Good;With continued PT status post acute discharge.           Goals per Eval/Re-eval:  Goals  Goal Formulation: With patient;With patient/family  Time for Goal Acheivement: 7 visits  Goals: Select goal  Pt Will Go Supine To Sit: With minimal assist  Pt Will Perform Sit to Stand: With minimal assist;Goal met;New goal;With stand by assist (09/20/13)  Pt Will Transfer to Toilet: With minimal assist  Pt Will Transfer to Tub/Shower: With minimal assist  PT Will Demonstrate Car Transfer Technique: With minimal assist  Pt Will Ambulate: 31-50 feet;with rolling walker;With minimal assist  Pt Will Go Up / Down Stairs: 3-5 stairs;With minimal assist  Pt Will Perform Home Exer Program: With minimal assist  Pt Will Perform LE Dressing w/Device: With min assist          Plan:  Continue transfer training and there ex and follow up with stair training as able tomorrow also or address after patient's  procedure on Monday.  Recommendation  Discharge Recommendation: Home with supervision;Home with home health PT  DME Recommended for Discharge:  (None-has equipment at home)  PT - Next Visit Recommendation: 09/21/13  PT Frequency: 7x/wk    Continue plan of care.      Signature: Erasmo Downer, PT 09/20/2013 12:34 PM

## 2013-09-21 LAB — BASIC METABOLIC PANEL
BUN: 10 mg/dL (ref 7–21)
CO2: 28 mEq/L (ref 22–29)
Calcium: 8.6 mg/dL (ref 7.9–10.6)
Chloride: 102 mEq/L (ref 98–107)
Creatinine: 0.7 mg/dL (ref 0.6–1.0)
Glucose: 101 mg/dL — ABNORMAL HIGH (ref 70–100)
Potassium: 3.8 mEq/L (ref 3.5–5.1)
Sodium: 140 mEq/L (ref 136–145)

## 2013-09-21 LAB — PT/INR
PT INR: 1.1 (ref 0.9–1.1)
PT: 14.2 (ref 12.6–15.0)

## 2013-09-21 LAB — CBC AND DIFFERENTIAL
Basophils Absolute Automated: 0.02 (ref 0.00–0.20)
Basophils Automated: 0 %
Eosinophils Absolute Automated: 0.61 (ref 0.00–0.70)
Eosinophils Automated: 6 %
Hematocrit: 30.3 % — ABNORMAL LOW (ref 37.0–47.0)
Hgb: 9.7 g/dL — ABNORMAL LOW (ref 12.0–16.0)
Immature Granulocytes Absolute: 0.02
Immature Granulocytes: 0 %
Lymphocytes Absolute Automated: 1.51 (ref 0.50–4.40)
Lymphocytes Automated: 14 %
MCH: 27.1 pg — ABNORMAL LOW (ref 28.0–32.0)
MCHC: 32 g/dL (ref 32.0–36.0)
MCV: 84.6 fL (ref 80.0–100.0)
MPV: 9.4 fL (ref 9.4–12.3)
Monocytes Absolute Automated: 0.88 (ref 0.00–1.20)
Monocytes: 8 %
Neutrophils Absolute: 7.73 (ref 1.80–8.10)
Neutrophils: 72 %
Nucleated RBC: 0 (ref 0–1)
Platelets: 365 (ref 140–400)
RBC: 3.58 — ABNORMAL LOW (ref 4.20–5.40)
RDW: 16 % — ABNORMAL HIGH (ref 12–15)
WBC: 10.75 (ref 3.50–10.80)

## 2013-09-21 LAB — GFR: EGFR: >60

## 2013-09-21 NOTE — PT Progress Note (Signed)
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      Patient's medical condition is appropriate for Physical Therapy  intervention at this time.  Patient cleared by nurse to participate in PT session, nursing reports MD should be coming in to change wounvac.    Subjective: I am hurting today.  I am very tearful also.  Patient is agreeable to participation in the therapy session.     Pain Assessment  Pain Assessment: Numeric Scale (0-10)  Pain Score: 5-moderate pain  POSS Score: Awake and Alert  Pain Location: Knee  Pain Orientation: Proximal;Right  Pain Descriptors: Aching;Constant;Pressure  Pain Frequency: Increases with movement  Effect of Pain on Daily Activities: mild  Patient's Stated Comfort Functional Goal: 1-mild pain  Pain Intervention(s): Repositioned;MD notified (Comment);Cold pack  Multiple Pain Sites: Yes          Objective:  Patient is in bed seen 6 Days Post-Op with dressings and woundvac.                                       Sensation is intact              Functional Mobility  Supine to Sit: Stand by assistance  Scooting to EOB: Stand by assistance  Sit to Supine: Min assist to right  Sit to Stand: Min assist  Stand to Sit: Stand by assistance         Locomotion  Ambulation: with  front-wheeled walker;minimal assistance  Ambulation Distance (Feet): 30 Feet  Pattern: decreased step length;decreased cadence    Therapeutic Exercise  Quad Sets: 25  Glute Sets: 25  Ankle Pumps: 25       Self Care and Home Management:  Pt did not perform self care activities at this time.    Educated the patient to role of physical therapy, plan of care, goals  of therapy and safety with mobility and ADLs, weight bearing precautions, home safety.    Patient is in bed with all needs provided and call bell within reach. RN notified of session outcome, including Dr. Derrill Kay came in and we changed out her woundvac.    Assessment:   Assessment  Assessment: Decreased LE ROM;Decreased LE strength;Decreased endurance/activity tolerance;Gait impairment  Prognosis: Good;With continued PT status post acute discharge.           Goals per Eval/Re-eval:     Goals  Goal Formulation: With patient;With patient/family  Time for Goal Acheivement: 7 visits  Goals: Select goal  Pt Will Go Supine To Sit: With minimal assist;Goal met 09/21/13  Pt Will Perform  Sit to Stand: With minimal assist  Pt Will Transfer to Toilet: With minimal assist  Pt Will Transfer to Tub/Shower: With minimal assist  PT Will Demonstrate Car Transfer Technique: With minimal assist  Pt Will Ambulate: 31-50 feet;with rolling walker;With minimal assist  Pt Will Go Up / Down Stairs: 3-5 stairs;With minimal assist  Pt Will Perform Home Exer Program: With minimal assist  Pt Will Perform LE Dressing w/Device: With min assist          Plan:  Recommendation  Discharge Recommendation: Home with home health PT  DME Recommended for Discharge:  (None-has equipment at home)  PT - Next Visit Recommendation: 09/23/13.    Plan for surgery on Monday await new orders for therapy.  PT Frequency: 7x/wk    Continue plan of care.      Signature: Louann Liv, PTA 09/21/2013 9:36 AM

## 2013-09-21 NOTE — Progress Notes (Signed)
Pt completed 2 units of PRBC without any transfusion reaction noted.

## 2013-09-21 NOTE — Progress Notes (Signed)
PROGRESS NOTE    Date Time: 09/21/2013 7:52 AM  Patient Name: [name], [name]      Assessment:    Right TKA Irrigation and Debridement and Poly Exchange 09/05/13    Plan:   + Wound drainage,Rresection arthroplasty and antibiotic spacer on Monday 09/15/13  Plan for surgery on 09/22/13, antibiotic spacer exchange and plastic surgery coverage  Transfuse 2U PRBC 09/20/13  PT - WBAT in Extension with brace on, No bending knee  DVT: ASA 325 bid  ID: Pan-Sensitive Proteus ssp Final Recommendations Cipro 750 bid for 6 weeks  Wound vac in place - will change 09/21/13  D/c planning pending outcome of explantation    Subjective:   Feels well, pain controlled, no current complaints.    Medications:     Current Facility-Administered Medications   Medication Dose Route Frequency   . amLODIPine  5 mg Oral Daily   . celecoxib  200 mg Oral Daily   . ciprofloxacin  750 mg Oral Q12H SCH   . cloNIDine  0.1 mg Oral Daily   . docusate sodium  100 mg Oral Q12H SCH   . ferrous sulfate 324 mg-ascorbic acid 500 mg combo dose   Oral Daily   . [COMPLETED] furosemide  20 mg Intravenous Once   . gabapentin  300 mg Oral BID   . hydrochlorothiazide  25 mg Oral Daily   . nebivolol  5 mg Oral QHS   . oxyCODONE  10 mg Oral Q12H SCH   . polycarbophil  1,250 mg Oral Daily   . polyethylene glycol  17 g Oral Daily   . potassium chloride  10 mEq Oral Daily   . Senna  17.2 mg Oral QHS   . tiotropium  18 mcg Inhalation QAM   . traMADol  50 mg Oral Q6H [facility]       Physical Exam:     Filed Vitals:    09/20/13 2039   BP: 145/54   Pulse: 65   Temp: 97.5 F (36.4 C)   Resp: 18   SpO2: 97%       Intake and Output Summary (Last 24 hours) at Date Time    Intake/Output Summary (Last 24 hours) at 09/21/13 5638  Last data filed at 09/21/13 0400   Gross per 24 hour   Intake    910 ml   Output      0 ml   Net    910 ml       General appearance - alert, well appearing, and in no distress  Musculoskeletal - Right knee  Dressing with SS drainage along distal 1/2. Wound well  approximated without breakdown.  Fires quad/TA/EHL/GSC  SILT SP/DP/TN  WWP distally  Calves soft, nontender bilateral    Physical Therapy:   Pain Score: 0-No pain (09/21/13 7564)  Weight Bearing Percent: 100  (09/20/13 1200)  Ambulation Distance (Feet): 25 Feet (09/20/13 0800)  Right Lower Extremity ROM:  (Knee brace locked in extension) (09/16/13 1145)  Left Lower Extremity ROM: within functional limits (09/16/13 1145)         Knee AROM : no knee flexion (09/07/13 0915)    Labs:         Recent CBC   No results found for this basename: WHITEBLOODCE,RBC,HGB,HCT,,LABPLAT, NRBCA, REFLX, ANRBA in the last 24 hours  Recent BMP   No results found for this basename: GLU,BUN,CREAT,CA,NA,K,CL,CO2,AGAP in the last 24 hours  Recent PT/INR   No results found for this basename: PTI,INR,COUM,COUMP in the last 24  hours    Rads:   Radiological Procedure reviewed.    Signed by: Garey Ham

## 2013-09-21 NOTE — Progress Notes (Signed)
[facility] PROGRESS NOTE    Date Time: 09/21/2013 8:44 AM  Patient Name: [name]        Subjective:   "I'm feeling OK." Daughter with patient.    Medications:      Scheduled Meds: PRN Meds:           amLODIPine 5 mg Oral Daily   celecoxib 200 mg Oral Daily   ciprofloxacin 750 mg Oral Q12H SCH   cloNIDine 0.1 mg Oral Daily   docusate sodium 100 mg Oral Q12H SCH   ferrous sulfate 324 mg-ascorbic acid 500 mg combo dose  Oral Daily   [COMPLETED] furosemide 20 mg Intravenous Once   gabapentin 300 mg Oral BID   hydrochlorothiazide 25 mg Oral Daily   nebivolol 5 mg Oral QHS   oxyCODONE 10 mg Oral Q12H SCH   polycarbophil 1,250 mg Oral Daily   polyethylene glycol 17 g Oral Daily   potassium chloride 10 mEq Oral Daily   Senna 17.2 mg Oral QHS   tiotropium 18 mcg Inhalation QAM   traMADol 50 mg Oral Q6H SCH         Continuous Infusions:         albuterol 2.5 mg Q4H PRN   bisacodyl 10 mg QD PRN   diphenhydrAMINE 25 mg Q6H PRN   HYDROcodone-acetaminophen 1 tablet Q4H PRN   HYDROcodone-acetaminophen 2 tablet Q6H PRN   HYDROcodone-acetaminophen 2 tablet Q4H PRN   HYDROmorphone 0.5 mg Q1H PRN   hydrOXYzine 25 mg Q12H PRN   ondansetron 4 mg Q8H PRN   ondansetron 4 mg Q8H PRN   senna-docusate 2 tablet BID PRN   zolpidem 5 mg QHS PRN   [DISCONTINUED] diphenhydrAMINE 25 mg Q4H PRN         I personally reviewed all of the medications      Review of Systems:   A comprehensive review of systems was obtained from chart review and the patient  General ROS: negative  Ophthalmic ROS: negative  Endocrine ROS: negative  Respiratory ROS: no cough, denies shortness of breath  Cardiovascular ROS: no chest pain or some dyspnea on exertion  Gastrointestinal ROS: no abdominal pain, no N/V, 3 BMs 12/13  Genito-Urinary ROS: no c/o dysuria, trouble voiding, or hematuria  Musculoskeletal ROS: right knee pain- managed with pain meds  Neurological ROS: no TIA or stroke symptoms, denies dizziness      Physical Exam:     Filed Vitals:     09/20/13 0807 09/20/13 0900 09/20/13 1547 09/20/13 2039   BP: 137/63 131/63 115/48 145/54   Pulse: 71 65 63 65   Temp:   97 F (36.1 C) 97.5 F (36.4 C)   TempSrc:   Oral    Resp: 20 18  18    Height:       Weight:       SpO2: 96%  95% 97%         Intake and Output Summary (Last 24 hours) at Date Time    Intake/Output Summary (Last 24 hours) at 09/21/13 0844  Last data filed at 09/21/13 0400   Gross per 24 hour   Intake    910 ml   Output      0 ml   Net    910 ml       General appearance - alert, well appearing, and in no distress  Mental status - alert, oriented to person, place, and time  Chest -mild expiratory wheeze (posteriorly), clear ant.- no  change, no rales or rhonchi, symmetric air entry  Heart - normal rate, regular rhythm, normal S1, S2, no murmurs  Abdomen - bowel sounds normal, abdomen is soft, nontender, non distended, obese  Neurological - alert, oriented, normal speech, no focal findings or movement disorder noted  Musculoskeletal - laying in bed  Extremities - peripheral pulses normal, no pedal edema, no clubbing or cyanosis  RLE w/ brace and ace wrap dressing      Labs:       Lab 09/18/13 0454 09/17/13 0500 09/16/13 0501   GLU 92 104* 135*   BUN 14 19 16    CREAT 0.7 0.8 0.8   CA 8.3 8.2 8.2   NA 141 140 143   K 4.1 4.1 4.4   CL 105 105 107   CO2 29 28 24    ALB 2.2* -- --   PHOS -- -- --   MG 1.7 -- --   EGFR >60.0 >60.0 >60.0   PT -- -- --   PTT -- -- --   INR -- -- --         Lab 09/18/13 0454   AST 17   ALT <6   ALKPHOS 88   ALB 2.2*   BILITOTAL 0.5   AMY --   LIP --         Lab 09/18/13 0454 09/17/13 0500 09/16/13 0501 09/15/13 1239   WBC 15.26* -- -- 14.26*   HGB 7.6* 7.9* 8.8* --   HCT 24.3* 24.7* 28.1* --   MCV 84.7 -- -- 83.4   MCH 26.5* -- -- 27.0*   MCHC 31.3* -- -- 32.4   RDW 15 -- -- 15   MPV 9.6 -- -- 9.5       No results found for this basename: TSH,FREET3,FREET4 in the last 168 hours        Rads:     Radiology Results (24 Hour)     ** No Results found for the last 24 hours. **                   Assessment and Plan:      Patient Active Problem List   Diagnosis   . S/p R TKA, readmitted 09/05/13. S/p R TKA I&D and Poly Exchange, s/p Revision- Continue plan per ID and Ortho. Consulted by Plastic Surgery. Additional surgery planned.    Nutrition note reviewed, d/w CM: Boost Breeze and Juven added.   . Low back pain - controlled   . Chronic obstructive pulmonary disease- On duonebs. Stable, continue current meds.    . Hypertension-  BP stable. Continue current meds and follow   Post-operative Acute Anemia, secondary to blood loss, s/p surgery. Continue Fe/Vit C. Pt s/p transfusions. S/p transfusions 12/13, per Ortho.  Increased nutritional needs- on Juven and boost breeze. Pt would like to try vanilla Boost. Will order with lunch.    Signed by: Sheryle Hail, NP

## 2013-09-21 NOTE — Plan of Care (Signed)
Problem: Potential for Compromised Skin Integrity  Goal: Skin integrity is maintained or improved  Assess and monitor skin integrity. Identify patients at risk for skin breakdown on admission and per policy. Collaborate with interdisciplinary team and initiate plans and interventions as needed.   Outcome: Progressing  Wound vac to right knee connected to suction,dsg CDI,right knee IROM in place.Able to ambulate to the bathroom/BSC using walker and stand-by assistance.

## 2013-09-22 ENCOUNTER — Inpatient Hospital Stay: Payer: No Typology Code available for payment source | Admitting: Pain Medicine

## 2013-09-22 ENCOUNTER — Encounter: Payer: Self-pay | Admitting: Pain Medicine

## 2013-09-22 ENCOUNTER — Encounter
Admission: AD | Disposition: A | Discharge status: Rehabilitation Facility | Payer: Self-pay | Source: Ambulatory Visit | Attending: Orthopaedic Surgery

## 2013-09-22 HISTORY — PX: GRAFT, SKIN  SPLIT THICKNESS, (MEDICAL): SHX4182

## 2013-09-22 HISTORY — PX: ARTHROPLASTY, KNEE, REVISION TOTAL: SHX3131

## 2013-09-22 HISTORY — PX: DEBRIDEMENT, MUSCLE FLAP CLOSURE: SHX3704

## 2013-09-22 LAB — CBC AND DIFFERENTIAL
Basophils Absolute Automated: 0.03 (ref 0.00–0.20)
Basophils Automated: 0 %
Eosinophils Absolute Automated: 0.57 (ref 0.00–0.70)
Eosinophils Automated: 5 %
Hematocrit: 31 % — ABNORMAL LOW (ref 37.0–47.0)
Hgb: 9.8 g/dL — ABNORMAL LOW (ref 12.0–16.0)
Immature Granulocytes Absolute: 0.02
Immature Granulocytes: 0 %
Lymphocytes Absolute Automated: 1.91 (ref 0.50–4.40)
Lymphocytes Automated: 17 %
MCH: 27 pg — ABNORMAL LOW (ref 28.0–32.0)
MCHC: 31.6 g/dL — ABNORMAL LOW (ref 32.0–36.0)
MCV: 85.4 fL (ref 80.0–100.0)
MPV: 9.3 fL — ABNORMAL LOW (ref 9.4–12.3)
Monocytes Absolute Automated: 1.05 (ref 0.00–1.20)
Monocytes: 9 %
Neutrophils Absolute: 7.85 (ref 1.80–8.10)
Neutrophils: 69 %
Nucleated RBC: 0 (ref 0–1)
Platelets: 404 — ABNORMAL HIGH (ref 140–400)
RBC: 3.63 — ABNORMAL LOW (ref 4.20–5.40)
RDW: 16 % — ABNORMAL HIGH (ref 12–15)
WBC: 11.41 — ABNORMAL HIGH (ref 3.50–10.80)

## 2013-09-22 LAB — COMPREHENSIVE METABOLIC PANEL
ALT: 7 U/L (ref 0–55)
AST (SGOT): 18 U/L (ref 5–34)
Albumin/Globulin Ratio: 0.8 — ABNORMAL LOW (ref 0.9–2.2)
Albumin: 2.6 g/dL — ABNORMAL LOW (ref 3.5–5.0)
Alkaline Phosphatase: 93 U/L (ref 40–150)
BUN: 19 mg/dL (ref 7–21)
Bilirubin, Total: 0.7 mg/dL (ref 0.2–1.2)
CO2: 25 mEq/L (ref 22–29)
Calcium: 8.5 mg/dL (ref 7.9–10.6)
Chloride: 103 mEq/L (ref 98–107)
Creatinine: 0.8 mg/dL (ref 0.6–1.0)
Globulin: 3.3 g/dL (ref 2.0–3.6)
Glucose: 102 mg/dL — ABNORMAL HIGH (ref 70–100)
Potassium: 4 mEq/L (ref 3.5–5.1)
Protein, Total: 5.9 g/dL — ABNORMAL LOW (ref 6.0–8.3)
Sodium: 140 mEq/L (ref 136–145)

## 2013-09-22 LAB — GFR: EGFR: >60

## 2013-09-22 SURGERY — ARTHROPLASTY, KNEE, REVISION TOTAL
Anesthesia: Regional | Laterality: Right | Wound class: Contaminated

## 2013-09-22 MED ORDER — ONDANSETRON HCL 4 MG/2ML IJ SOLN
4.0000 mg | Freq: Once | INTRAMUSCULAR | Status: AC | PRN
Start: 2013-09-22 — End: 2013-09-22

## 2013-09-22 MED ORDER — FENTANYL CITRATE 0.05 MG/ML IJ SOLN
25.0000 ug | INTRAMUSCULAR | Status: DC | PRN
Start: 2013-09-22 — End: 2013-10-01

## 2013-09-22 MED ORDER — METOCLOPRAMIDE HCL 5 MG/ML IJ SOLN
10.0000 mg | Freq: Once | INTRAMUSCULAR | Status: DC | PRN
Start: 2013-09-22 — End: 2013-10-01

## 2013-09-22 MED ORDER — ONDANSETRON HCL 4 MG/2ML IJ SOLN
4.0000 mg | Freq: Once | INTRAMUSCULAR | Status: DC | PRN
Start: 2013-09-22 — End: 2013-09-23

## 2013-09-22 MED ORDER — OXYCODONE-ACETAMINOPHEN 5-325 MG PO TABS
1.0000 | ORAL_TABLET | ORAL | Status: DC | PRN
Start: 2013-09-22 — End: 2013-10-03

## 2013-09-22 MED ORDER — BACITRACIN 50000 UNITS IM SOLR
INTRAMUSCULAR | Status: DC | PRN
Start: 2013-09-22 — End: 2013-09-22
  Administered 2013-09-22: 50000 [IU]

## 2013-09-22 MED ORDER — FENTANYL CITRATE 0.05 MG/ML IJ SOLN
INTRAMUSCULAR | Status: AC
Start: 2013-09-22 — End: ?
  Filled 2013-09-22: qty 2

## 2013-09-22 MED ORDER — HYDROMORPHONE HCL PF 1 MG/ML IJ SOLN
0.5000 mg | INTRAMUSCULAR | Status: DC | PRN
Start: 2013-09-22 — End: 2013-10-01

## 2013-09-22 MED ORDER — HYDROMORPHONE HCL PF 1 MG/ML IJ SOLN
INTRAMUSCULAR | Status: AC
Start: 2013-09-22 — End: 2013-09-22
  Administered 2013-09-22: 0.5 mg via INTRAVENOUS
  Filled 2013-09-22: qty 1

## 2013-09-22 MED ORDER — LABETALOL HCL 5 MG/ML IV SOLN
20.0000 mg | Freq: Once | INTRAVENOUS | Status: AC
Start: 2013-09-22 — End: 2013-09-22

## 2013-09-22 MED ORDER — PROPOFOL 10 MG/ML IV EMUL
INTRAVENOUS | Status: AC
Start: 2013-09-22 — End: ?
  Filled 2013-09-22: qty 100

## 2013-09-22 MED ORDER — LIDOCAINE-EPINEPHRINE 2 %-1:200000 IJ SOLN
INTRAMUSCULAR | Status: DC | PRN
Start: 2013-09-22 — End: 2013-09-22
  Administered 2013-09-22 (×2): 5 mL
  Administered 2013-09-22: 3 mL via EPIDURAL

## 2013-09-22 MED ORDER — SODIUM CHLORIDE 0.9 % IV SOLN
INTRAVENOUS | Status: AC
Start: 2013-09-22 — End: ?
  Filled 2013-09-22: qty 250

## 2013-09-22 MED ORDER — BACITRACIN 50000 UNITS IM SOLR
INTRAMUSCULAR | Status: AC
Start: 2013-09-22 — End: ?
  Filled 2013-09-22: qty 100000

## 2013-09-22 MED ORDER — PROPOFOL 10 MG/ML IV EMUL
INTRAVENOUS | Status: AC
Start: 2013-09-22 — End: ?
  Filled 2013-09-22: qty 120

## 2013-09-22 MED ORDER — MINERAL OIL LIGHT OIL
TOPICAL_OIL | Status: AC
Start: 2013-09-22 — End: ?
  Filled 2013-09-22: qty 10

## 2013-09-22 MED ORDER — HYDROCODONE-ACETAMINOPHEN 5-325 MG PO TABS
1.0000 | ORAL_TABLET | Freq: Once | ORAL | Status: AC | PRN
Start: 2013-09-22 — End: 2013-09-22

## 2013-09-22 MED ORDER — PROPOFOL INFUSION 10 MG/ML
INTRAVENOUS | Status: DC | PRN
Start: 2013-09-22 — End: 2013-09-22
  Administered 2013-09-22: 55 ug/kg/min via INTRAVENOUS

## 2013-09-22 MED ORDER — LACTATED RINGERS IV SOLN
INTRAVENOUS | Status: DC | PRN
Start: 2013-09-22 — End: 2013-09-22

## 2013-09-22 MED ORDER — BUPIVACAINE HCL (PF) 0.5 % IJ SOLN
INTRAMUSCULAR | Status: AC
Start: 2013-09-22 — End: ?
  Filled 2013-09-22: qty 30

## 2013-09-22 MED ORDER — CEFAZOLIN SODIUM-DEXTROSE 2-3 GM-% IV SOLR
2.0000 g | Freq: Three times a day (TID) | INTRAVENOUS | Status: DC
Start: 2013-09-22 — End: 2013-09-23
  Administered 2013-09-23: 2 g via INTRAVENOUS
  Filled 2013-09-22 (×6): qty 50

## 2013-09-22 MED ORDER — SODIUM CHLORIDE 0.9 % IV SOLN
INTRAVENOUS | Status: DC | PRN
Start: 2013-09-22 — End: 2013-09-22

## 2013-09-22 MED ORDER — METOCLOPRAMIDE HCL 5 MG/ML IJ SOLN
10.0000 mg | Freq: Once | INTRAMUSCULAR | Status: DC | PRN
Start: 2013-09-22 — End: 2013-09-23
  Filled 2013-09-22: qty 2

## 2013-09-22 MED ORDER — HYDROMORPHONE HCL PF 1 MG/ML IJ SOLN
0.5000 mg | INTRAMUSCULAR | Status: DC | PRN
Start: 2013-09-22 — End: 2013-09-23
  Administered 2013-09-22: 0.5 mg via INTRAVENOUS

## 2013-09-22 MED ORDER — BUPIVACAINE HCL (PF) 0.5 % IJ SOLN
INTRAMUSCULAR | Status: DC | PRN
Start: 2013-09-22 — End: 2013-09-22
  Administered 2013-09-22: 3 mL via INTRATHECAL

## 2013-09-22 MED ORDER — LABETALOL HCL 5 MG/ML IV SOLN
INTRAVENOUS | Status: AC
Start: 2013-09-22 — End: 2013-09-22
  Administered 2013-09-22: 10 mg via INTRAVENOUS
  Filled 2013-09-22: qty 4

## 2013-09-22 MED ORDER — VANCOMYCIN HCL 1000 MG IV SOLR
INTRAVENOUS | Status: AC
Start: 2013-09-22 — End: ?
  Filled 2013-09-22: qty 1000

## 2013-09-22 MED ORDER — FENTANYL 2 MCG/ML+ROPIVACAINE 0.125% EPIDURAL 250 ML
EPIDURAL | Status: AC
Start: 2013-09-22 — End: 2013-09-22
  Administered 2013-09-22: 250 mL via EPIDURAL
  Filled 2013-09-22: qty 250

## 2013-09-22 MED ORDER — VANCOMYCIN HCL 1000 MG IV SOLR
1000.0000 mg | INTRAVENOUS | Status: AC
Start: 2013-09-23 — End: 2013-09-23
  Administered 2013-09-23: 1000 mg via INTRAVENOUS
  Filled 2013-09-22: qty 1000

## 2013-09-22 MED ORDER — FENTANYL CITRATE 0.05 MG/ML IJ SOLN
25.0000 ug | INTRAMUSCULAR | Status: DC | PRN
Start: 2013-09-22 — End: 2013-09-23
  Filled 2013-09-22: qty 0.5

## 2013-09-22 MED ORDER — TOBRAMYCIN SULFATE 1.2 G IJ SOLR
INTRAMUSCULAR | Status: DC | PRN
Start: 2013-09-22 — End: 2013-09-22
  Administered 2013-09-22: 7200 mg

## 2013-09-22 MED ORDER — VANCOMYCIN HCL POWD
Status: DC | PRN
Start: 2013-09-22 — End: 2013-09-22
  Administered 2013-09-22: 6000 mg

## 2013-09-22 MED ORDER — ONDANSETRON HCL 4 MG/2ML IJ SOLN
4.0000 mg | Freq: Once | INTRAMUSCULAR | Status: DC | PRN
Start: 2013-09-22 — End: 2013-10-01

## 2013-09-22 MED ORDER — MIDAZOLAM HCL 5 MG/5ML IJ SOLN
INTRAMUSCULAR | Status: AC
Start: 2013-09-22 — End: ?
  Filled 2013-09-22: qty 5

## 2013-09-22 MED ORDER — ROPIVACAINE HCL 5 MG/ML IJ SOLN
INTRAMUSCULAR | Status: AC
Start: 2013-09-22 — End: ?
  Filled 2013-09-22: qty 30

## 2013-09-22 MED ORDER — NALOXONE HCL 0.4 MG/ML IJ SOLN
0.1000 mg | INTRAMUSCULAR | Status: DC | PRN
Start: 2013-09-22 — End: 2013-10-03

## 2013-09-22 MED ORDER — MEPERIDINE HCL 25 MG/ML IJ SOLN
25.0000 mg | INTRAMUSCULAR | Status: DC | PRN
Start: 2013-09-22 — End: 2013-10-01

## 2013-09-22 MED ORDER — MIDAZOLAM HCL 2 MG/2ML IJ SOLN
INTRAMUSCULAR | Status: DC | PRN
Start: 2013-09-22 — End: 2013-09-22
  Administered 2013-09-22: 5 mg via INTRAVENOUS

## 2013-09-22 MED ORDER — TOBRAMYCIN SULFATE 1.2 G IJ SOLR
INTRAMUSCULAR | Status: AC
Start: 2013-09-22 — End: ?
  Filled 2013-09-22: qty 7200

## 2013-09-22 MED ORDER — VANCOMYCIN HCL 1000 MG IV SOLR
INTRAVENOUS | Status: AC
Start: 2013-09-22 — End: ?
  Filled 2013-09-22: qty 6000

## 2013-09-22 MED ORDER — CEFAZOLIN SODIUM 1 G IJ SOLR
INTRAMUSCULAR | Status: DC | PRN
Start: 2013-09-22 — End: 2013-09-22
  Administered 2013-09-22: 2 g via INTRAVENOUS

## 2013-09-22 MED ORDER — FENTANYL 2 MCG/ML+ROPIVACAINE 0.125% EPIDURAL 250 ML
EPIDURAL | Status: AC
Start: 2013-09-22 — End: 2013-09-23

## 2013-09-22 MED ORDER — MEPERIDINE HCL 25 MG/ML IJ SOLN
25.0000 mg | INTRAMUSCULAR | Status: DC | PRN
Start: 2013-09-22 — End: 2013-09-23
  Filled 2013-09-22: qty 1

## 2013-09-22 MED ORDER — LIDOCAINE-EPINEPHRINE 2 %-1:200000 IJ SOLN
INTRAMUSCULAR | Status: AC
Start: 2013-09-22 — End: ?
  Filled 2013-09-22: qty 20

## 2013-09-22 MED ORDER — ROPIVACAINE HCL 5 MG/ML IJ SOLN
INTRAMUSCULAR | Status: DC | PRN
Start: 2013-09-22 — End: 2013-09-22
  Administered 2013-09-22 (×5): 5 mL via EPIDURAL
  Administered 2013-09-22: 3 mL via EPIDURAL
  Administered 2013-09-22: 5 mL via EPIDURAL
  Administered 2013-09-22: 3 mL via EPIDURAL
  Administered 2013-09-22: 5 mL via EPIDURAL

## 2013-09-22 MED ORDER — FENTANYL CITRATE 0.05 MG/ML IJ SOLN
INTRAMUSCULAR | Status: DC | PRN
Start: 2013-09-22 — End: 2013-09-22
  Administered 2013-09-22 (×3): 100 ug via EPIDURAL

## 2013-09-22 MED ORDER — CEFAZOLIN SODIUM-DEXTROSE 2-3 GM-% IV SOLR
INTRAVENOUS | Status: AC
Start: 2013-09-22 — End: ?
  Filled 2013-09-22: qty 50

## 2013-09-22 MED ORDER — OXYCODONE HCL ER 10 MG PO T12A
EXTENDED_RELEASE_TABLET | ORAL | Status: AC
Start: 2013-09-22 — End: 2013-09-23
  Administered 2013-09-23: 10 mg via ORAL
  Filled 2013-09-22: qty 1

## 2013-09-22 SURGICAL SUPPLY — 197 items
ADHESIVE SKIN SURGISEAL .35ML (Suture) IMPLANT
APPLCATOR CHLORAPREP 26ML (Prep) ×9 IMPLANT
APPLIER IN CLP TI LG SUP INTLK PRM SGCLP (Clips) ×1
APPLIER INTERNAL CLIP LARGE L13 IN (Clips) ×2
APPLIER INTERNAL CLIP LARGE L13 IN TITANIUM 13 CLIP LIGATE AUTOMATIC (Clips) ×2 IMPLANT
BANDAGE ADH POLYACRYLATE PLSTR CVRL 2YD (Dressing)
BANDAGE ADHESIVE L2 YD X W8 IN (Dressing)
BANDAGE ADHESIVE L2 YD X W8 IN STRETCHABLE NONWOVEN LIGHT COMPRESSION (Dressing) IMPLANT
BANDAGE CMPR CFLX NL 5YDX6IN LF STRL (Procedure Accessories) ×1
BANDAGE CMPR PLSTR CTTN MED MTRX 5YDX6IN (Bandage) ×1
BANDAGE CMPR VLCR PLSTR CTTN MED MTRX 15 (Bandage) ×1
BANDAGE COFLEX NL COMPRESSION L5 YD X W6 (Procedure Accessories) ×2
BANDAGE COFLEX NL COMPRESSION L5 YD X W6 IN COHESIVE SELF ADHERENT TAN (Procedure Accessories) ×2 IMPLANT
BANDAGE ELASTIC L5 YD X W6 IN W/SELF-CLOSURE HOOK (Bandage) ×2 IMPLANT
BANDAGE GZE CTTN MED KRLX 3.6YDX6.4IN LF (Dressing)
BANDAGE KERLIX MEDIUM GAUZE L3.6 YD X (Dressing) IMPLANT
BANDAGE MEDIUM COMPRESSION L15 YD X W6 IN ELASTIC VELCRO POLYESTER (Bandage) ×2 IMPLANT
BANDAGE MEDLINE MEDIUM COMPRESSION L15 (Bandage) ×2
BANDAGE MEDLINE MEDIUM COMPRESSION L5 YD (Bandage) ×2
BLADE  DERMATOME II (Blade) IMPLANT
BLADE DERMATOME PADGETT (Procedure Accessories) ×2 IMPLANT
BLADE DRMTM PDGT STRL DISP (Procedure Accessories) ×1
BLADE SURG SAFETYLOCK STRL 10 (Blade) ×21 IMPLANT
BLADE SURG SAFETYLOCK STRL 15 (Blade) ×6 IMPLANT
BNDG ACE ELASTIC 6IN STRL (Procedure Accessories) IMPLANT
BOWL CEMENT GUN BATCH W NOZZLE (Ortho Supply) ×3 IMPLANT
CARRIER GFT 3:1 MSGRFT II DRMCR II SKN (Procedure Accessories) ×3
CARRIER GFT 9:1 MSGRFT DERMACARRIERS II (Disposable Instruments)
CARRIER GRAFT 9:1 MESHGRAFT (Disposable Instruments)
CARRIER GRAFT 9:1 MESHGRAFT DERMACARRIERS II SKIN (Disposable Instruments) IMPLANT
CARRIER GRAFT MESHGRAFT II DERMACARRIER II 3:1 SKIN (Procedure Accessories) ×2 IMPLANT
CEMENT BONE SNGL PALACOS 1X40 (Cement) ×9 IMPLANT
CONTAINER SPEC LLDPE 16OZ W LF NS LEK (Procedure Accessories) ×9
CONTAINER SPECIMEN 4 OZ STRL (Procedure Accessories) ×9 IMPLANT
CONTAINER SPECIMEN C16 OZ WIDE LEAK SHATTER RESISTANT SNAP ON LID (Procedure Accessories) ×6 IMPLANT
CUFF TOURNIQUET CYLINDRICAL L30 IN X W4 IN 2 PORT 1 BLADDER QUICK (Procedure Accessories) IMPLANT
CUFF TRNQT CYL CLR CUF 30X4IN LF STRL 2 (Procedure Accessories)
DEPRESSOR TONGUE JR BIRCHWOOD (Procedure Accessories) ×3 IMPLANT
DIFFUSER AIR (Procedure Accessories) ×3 IMPLANT
DRAIN INCS SIL FULL FLUT RND BARD 10FR (Drain)
DRAIN INCS SIL RND 19FR .25IN LF STRL (Drain)
DRAIN OD10 FR 4 FREE FLOW CHANNEL (Drain)
DRAIN OD10 FR 4 FREE FLOW CHANNEL RADIOPAQUE BARD L1/8 IN INCISION (Drain) IMPLANT
DRAIN OD19 FR RADIOPAQUE 4 FREE FLOW (Drain)
DRAIN OD19 FR RADIOPAQUE 4 FREE FLOW CHANNEL FULL FLUTE CHANNEL DRAIN (Drain) IMPLANT
DRAPE 3/4 SHEET FANFLD 52X76IN (Drape) IMPLANT
DRAPE LWR EXT 114X88X126IN (Drape) ×3 IMPLANT
DRAPE SRG PLS U STRDRP 51X47IN LF STRL (Drape) ×1
DRAPE SRG TBRN CNVRT 122X106X77IN LF (Drape) ×3
DRAPE STERI LARGE W/TOWEL (Drape) ×3 IMPLANT
DRAPE SURGICAL ADHESIVE L51 IN X W47 IN (Drape) ×2
DRAPE SURGICAL ADHESIVE L51 IN X W47 IN STERI-DRAPE CLEAR (Drape) ×2 IMPLANT
DRAPE SURGICAL IMPERVIOUS REINFORCEMENT FENESTRATE ABSORBENT ARMBOARD (Drape) ×2 IMPLANT
DRESSING EMLSN OIL CURITY 3X5 (Dressing) ×3 IMPLANT
DRESSING PETRO 3% BI 3BRM GZE XR 9X5IN (Dressing)
DRESSING PETROLATUM L9 IN X W5 IN NONADHESIVE OCCLUSIVE BACTERIOSTATIC (Dressing) IMPLANT
DRESSING PETROLATUM XEROFORM L9 IN X W5 IN 3% BISMUTH TRIBROMOPHENATE (Dressing) IMPLANT
DRESSING TELFA 3X8IN STERILE (Dressing) ×6 IMPLANT
ELECTRODE ELECTROSRGCL BLADE STD L2.5IN MEGADYNE E-Z CLEAN PTFE MNPL (Blade) ×2 IMPLANT
ELECTRODE ELECTROSURGICAL BLADE STANDARD (Blade) ×2
ELECTRODE ESURG PTFE BLDE STD MEGADYNE (Blade) ×1
GLOVE SRG NTR RBR 8 INDCTR BGL 299X103MM (Glove) ×2
GLOVE SURG BIOGEL INDIC SZ 8.5 (Glove) ×12 IMPLANT
GLOVE SURG BIOGEL MICRO SZ7.5 (Glove) ×6 IMPLANT
GLOVE SURG BIOGEL ORTHO SZ8.5 (Glove) ×12 IMPLANT
GLOVE SURGICAL 8 INDICATOR BIOGEL POWDER (Glove) ×4
GLOVE SURGICAL 8 INDICATOR BIOGEL POWDER FREE SMOOTH BEAD CUFF (Glove) ×4 IMPLANT
GOWN LARGE POLY-REINFORCED (Gown) ×9 IMPLANT
GOWN X-LRG POLY REINFORCED (Gown) ×12 IMPLANT
HANDPIECE INTERPLUSE HIGH FLOW (Cautery) ×6 IMPLANT
HOLDER CATH VLCR UNV CATH-MATE LF ADJ (Procedure Accessories) ×1
HOLDER CATHETER UNIVERSAL ADJUSTABLE (Procedure Accessories) ×2 IMPLANT
HOOD T4 STERI-SHIELD (Personal Protection) ×12 IMPLANT
INACTIVE USE LAWSON 120900 (Gown) ×6 IMPLANT
KIT DRAINAGE L12.5 IN ROUND 3 SPRING (Drain) ×2
KIT DRAINAGE L12.5 IN ROUND 3 SPRING EVACUATOR Y CONNECTOR TUBE HOLE (Drain) ×2 IMPLANT
KIT DRN PVC 400CC RND 1/8IN 12.5IN LF (Drain) ×1
KIT OR GENERAL MVH (Kits) ×6 IMPLANT
MANIFOLD 4 PORT NEPTUNE FILTER (Filter) ×3 IMPLANT
MARKER SKIN (Positioning Supplies) IMPLANT
NEEDLE SPINAL DISP 18GX3.5IN (Needles) ×3 IMPLANT
PACK SRG TBRN UNV LF STRL ABS REINF ADH (Pack) ×1
PACK SURGICAL UNIVERSAL ABSORBENT (Pack) ×2
PACK SURGICAL UNIVERSAL ABSORBENT REINFORCE ADHESIVE CORD HOLD STRAP (Pack) ×2 IMPLANT
PAD ABD PVC CRTY 9X5IN LF STRL 3 LYR (Dressing) ×3 IMPLANT
PAD ELECTROSRG GRND REM W CRD (Procedure Accessories) ×9 IMPLANT
PADDING CAST L4 YD X W4 IN UNDERCAST (Cast) ×2
PADDING CAST L4 YD X W4 IN UNDERCAST MILD STRETCH COHESIVE REGULAR (Cast) ×2 IMPLANT
PADDING CAST L4 YD X W6 IN UNDERCAST (Cast) ×2
PADDING CAST L4 YD X W6 IN UNDERCAST MILD STRETCH COHESIVE WEBRIL (Cast) ×2 IMPLANT
PADDING CAST SYNTHETIC 4INX4YD (Cast) ×3 IMPLANT
PADDING CST CTTN WBRL 4YDX4IN LF STRL (Cast) ×1
PADDING CST CTTN WBRL 4YDX6IN LF STRL (Cast) ×1
POUCH INST 18X30CM (Drape) IMPLANT
RESERVOIR DRAINAGE JACKSON-PRATT BULB SILICONE 100 CC (Drain) IMPLANT
RESERVOIR DRN SIL 100CC BLB JP LF STRL (Drain)
SLEEVE SEQUEN COMP KNEE REG (Procedure Accessories) ×3 IMPLANT
SOL ALCOHOL ISOPROPYL 70% 4 OZ (Prep) ×3 IMPLANT
SOLUTION IRR 0.9% NACL 1000ML LF STRL (Irrigation Solutions) ×2
SOLUTION IRR 0.9% NACL 3L ARTHMTC LF (Irrigation Solutions) ×1
SOLUTION IRR 0.9% NACL 3L URMTC LF PLS (Irrigation Solutions) ×6
SOLUTION IRRIGATION 0.9% SODIUM CHLORIDE (Irrigation Solutions) ×6 IMPLANT
SOLUTION IRRIGATION 0.9% SODIUM CHLORIDE 1000 ML PLASTIC POUR BOTTLE (Irrigation Solutions) ×4 IMPLANT
SOLUTION IRRIGATION 0.9% SODIUM CHLORIDE 3000 ML PLASTIC CONTAINER (Irrigation Solutions) ×4 IMPLANT
SPONGE GAUZE L4 IN X W4 IN 12 PLY (Sponge) IMPLANT
SPONGE GAUZE L6 3/4 IN X W6 IN MEDIUM (Dressing) ×2
SPONGE GAUZE L6 3/4 IN X W6 IN MEDIUM ABSORBENT FLUFF DRY CRINKLE (Dressing) ×2 IMPLANT
SPONGE GZE CTTN MED KRLX 6.75X6IN LF (Dressing) ×1
SPONGE GZE PLS CTTN CRTY 4X4IN LF STRL (Sponge)
SPONGE LAP CRTY 12X12IN LF STRL PRE-WSH (Sponge) ×12 IMPLANT
SPONGE LAP POCKIT PCH 12X12IN STRL (Sponge) ×3
SPONGE LAPAROTOMY L12 IN X W12 IN (Sponge) ×6
SPONGE LAPAROTOMY L12 IN X W12 IN PREWASH POCKIT POUCH WHITE (Sponge) ×6 IMPLANT
STAPLER SKIN L3.9 MM X W6.9 MM WIDE 35 (Skin Closure) ×2
STAPLER SKIN L3.9 MM X W6.9 MM WIDE 35 COUNT FIX HEAD RATCHET (Skin Closure) ×2 IMPLANT
STAPLER SKIN W4.8 MM X H3.4 MM 35 WIDE (Staplers) ×4 IMPLANT
STAPLER SKN SS MF PRM .51MM 4.8X3.4MM LF (Staplers) ×2
STAPLER SKN SS W PRX PX .58MM 3.9X6.9MM (Skin Closure) ×1
STOCKINETTE IMPERVIOUS LARGE (Procedure Accessories) ×3 IMPLANT
STRAP POSITIONING L19 IN X W3.5 IN (Procedure Accessories) ×2
STRAP POSITIONING L19 IN X W3.5 IN STIRRUP SLIP RING TECLIN (Procedure Accessories) ×2 IMPLANT
STRAP PSTN TECLIN 19X3.5IN LF NS STRUP (Procedure Accessories) ×1
SUT ETHILON 4-0 PS2 18IN (Suture) IMPLANT
SUTURE ABS 0 CT PDS2 36IN MFL VIOL (Suture) ×7
SUTURE ABS 1 CP COAT VCL 27IN BRD UD (Suture) ×3
SUTURE ABS 2-0 CT1 PDS2 27IN MFL VIOL (Suture)
SUTURE ABS 2-0 SH MNCRL 27IN MFL UD (Suture) ×6
SUTURE ABS 3-0 SH MNCRL 27IN MFL UD (Suture)
SUTURE ABS 3-0 SH VCL 27IN BRD COAT UD (Suture)
SUTURE ABS 4-0 P-3 MNCRL MTPS 18IN MFL (Suture) ×2
SUTURE ABS 4-0 PS2 PDS2 MTPS 18IN MFL UD (Suture)
SUTURE ABS CR 4-0 FS2 27IN MFL BRN (Suture) ×1
SUTURE CHROMIC GUT CHROMIC 4-0 FS-2 L27 (Suture) ×2 IMPLANT
SUTURE COATED VICRYL 1 CP L27 IN BRAID (Suture) ×6
SUTURE COATED VICRYL 1 CP L27 IN BRAID UNDYED ABSORBABLE (Suture) ×6 IMPLANT
SUTURE COATED VICRYL 3-0 SH L27 IN BRAID (Suture) IMPLANT
SUTURE ETHILON BLACK 3-0 FS-1 L18 IN (Suture) IMPLANT
SUTURE ETHILON BLACK 3-0 PS-1 L18 IN (Suture)
SUTURE ETHILON BLACK 3-0 PS-1 L18 IN MONOFILAMENT NONABSORBABLE (Suture) IMPLANT
SUTURE MONOCRYL 2-0 SH L27 IN (Suture) ×12
SUTURE MONOCRYL 2-0 SH L27 IN MONOFILAMENT UNDYED ABSORBABLE (Suture) ×12 IMPLANT
SUTURE MONOCRYL 3-0 SH L27 IN (Suture)
SUTURE MONOCRYL 3-0 SH L27 IN MONOFILAMENT UNDYED ABSORBABLE (Suture) IMPLANT
SUTURE MONOCRYL 4-0 P-3 L18 IN (Suture) ×4
SUTURE MONOCRYL 4-0 P-3 L18 IN MONOFILAMENT UNDYED ABSORBABLE (Suture) ×4 IMPLANT
SUTURE NABSB 3-0 FS1 ETH MTPS 18IN MFL (Suture)
SUTURE NABSB 3-0 FS1 PRLN 18IN MFL BLU (Suture)
SUTURE NABSB 3-0 FSL PRLN 30IN MFL BLU (Suture)
SUTURE NABSB 3-0 PS1 ETH MTPS 18IN MFL (Suture)
SUTURE NABSB SLK 3-0 PRMHND 18IN BRD TIE (Suture) ×1
SUTURE PDS II 0 CT L36 IN MONOFILAMENT (Suture) ×14
SUTURE PDS II 0 CT L36 IN MONOFILAMENT VIOLET ABSORBABLE (Suture) ×14 IMPLANT
SUTURE PDS II 1 CT 36IN (Suture) ×9 IMPLANT
SUTURE PDS II 2-0 CT-1 L27 IN (Suture)
SUTURE PDS II 2-0 CT-1 L27 IN MONOFILAMENT VIOLET ABSORBABLE (Suture) IMPLANT
SUTURE PDS II 3-0 FS1 27IN (Suture) IMPLANT
SUTURE PDS II 3-0 SH 27IN (Suture) IMPLANT
SUTURE PDS II 4-0 PS-2 L18 IN (Suture)
SUTURE PDS II 4-0 PS-2 L18 IN MONOFILAMENT UNDYED ABSORBABLE (Suture) IMPLANT
SUTURE PROLENE 0 CT1 30IN (Suture) ×6 IMPLANT
SUTURE PROLENE 1 CT 30IN (Suture) IMPLANT
SUTURE PROLENE BLUE 3-0 FS-1 L18 IN (Suture)
SUTURE PROLENE BLUE 3-0 FS-1 L18 IN MONOFILAMENT NONABSORBABLE (Suture) IMPLANT
SUTURE PROLENE BLUE 3-0 FSL L30 IN (Suture)
SUTURE PROLENE BLUE 3-0 FSL L30 IN MONOFILAMENT NONABSORBABLE (Suture) IMPLANT
SUTURE SILK PERMA HAND BLACK 3-0 L18 IN (Suture) ×2
SUTURE SILK PERMA HAND BLACK 3-0 L18 IN BRAID TIES 12 STRAND PRECUT (Suture) ×2 IMPLANT
SUTURE VICRYL 4-0 SH 27IN (Suture) IMPLANT
SYRINGE 20 ML BD LUER-LOK MEDICAL (Syringes, Needles) ×2 IMPLANT
SYRINGE BULB 60CC LATEX FREE (Syringes, Needles) ×3 IMPLANT
SYRINGE MED 20ML LL LF STRL (Syringes, Needles) ×3
TIP SCT VNYL ARG YNKR 18FR 25CM LF STRL (Tubing) ×2
TIP SUCTION ARGYLE YANKAUER OD18 FR L25 CM VINYL SMOOTH INNER LUMEN (Tubing) ×4 IMPLANT
TIP SUCTION OD18 FR L25 CM VINYL SMOOTH (Tubing) ×4
TOOL DISSECTING L14 CM BALL FLUTE OD4 MM (Drillbits) ×2
TOOL DISSECTING L14 CM BALL FLUTE OD4 MM MIDAS REX LEGEND BURR (Drillbits) ×2 IMPLANT
TOOL DSCT LGND 4MM 14CM (Drillbits) ×1
TOURNIQUET 34X4 PURPLE (Procedure Accessories) IMPLANT
TOURNIQUET 44X4 BLUE (Procedure Accessories) IMPLANT
TOWEL L26 IN X W17 IN COTTON PREWASH (Procedure Accessories) ×14
TOWEL L26 IN X W17 IN COTTON PREWASH DELINT BLUE ACTISORB SURGICAL (Procedure Accessories) ×14 IMPLANT
TOWEL SRG CTTN 26X17IN LF STRL PREWASH (Procedure Accessories) ×7
TRANSPORT-SYSTEM PORT-A-CUL11M (Lab Supplies) IMPLANT
TRAY CATH FOLEY SILICONE 16FR (Catheter Urine) ×6 IMPLANT
TRAY SKIN BETANDINE PREP (Tray) ×3 IMPLANT
TRAY SKIN SCRUB MEDLINE PVP IODINE (Prep) ×6
TRAY SKIN SCRUB MEDLINE PVP IODINE STANDARD WET GLOVE SPONGE (Prep) ×6 IMPLANT
TRAY SKN SCRB PVP IOD STD LF WT GLV SPNG (Prep) ×3
TRAY TOTAL KNEE PACK (Tray) ×3 IMPLANT
TUBE CULTURE STURTS LIQ BBL (Procedure Accessories) ×3 IMPLANT
WATER STERILE PLASTIC POUR BOTTLE 1000 (Irrigation Solutions) ×2
WATER STERILE PLASTIC POUR BOTTLE 1000 ML (Irrigation Solutions) ×2 IMPLANT
WATER STERILE PVC FREE DEHP FREE 1000 ML (Solution) ×4 IMPLANT
WATER STRL 1000ML LF PLS PR BTL (Irrigation Solutions) ×1
WATER STRL 1000ML PIC LF PVC FR DEHP-FR (Solution) ×2
WAX BN STRL 2.5G NTR (Hemostat)
WAX BONE 2.5 G NATURAL (Hemostat) IMPLANT

## 2013-09-22 NOTE — Addendum Note (Signed)
Addendum  created 09/22/13 2318 by Remi Haggard, MD    Modules edited:Anesthesia Medication Administration, Orders

## 2013-09-22 NOTE — OR PreOp (Signed)
Per OR charge nurse, surgical consent correct with exchange of right knee antibiotic spacer

## 2013-09-22 NOTE — Transfer of Care (Signed)
Anesthesia Transfer of Care Note    Patient: [name]    Procedures performed: Procedure(s) with comments:  ARTHROPLASTY, KNEE, REVISION TOTAL  DEBRIDEMENT, MUSCLE FLAP CLOSURE - DEBRIDEMENT & MUSCLE FLAP KNEE RT  GRAFT, SKIN  SPLIT THICKNESS, (MEDICAL)    Anesthesia type: Combined Spinal/Epidural    Patient location:Phase I PACU    Last vitals:   Filed Vitals:    09/22/13 1700   BP:    Pulse: 66   Temp:    Resp:    SpO2: 99%       Post pain: Patient not complaining of pain, continue current therapy      Mental Status:alert     Respiratory Function: tolerating nasal cannula    Cardiovascular: stable    Nausea/Vomiting: patient not complaining of nausea or vomiting    Hydration Status: adequate    Post assessment: no apparent anesthetic complications

## 2013-09-22 NOTE — Anesthesia Procedure Notes (Signed)
Combined Spinal epidural    Patient location during procedure: pre-opReason for block: Primary Anesthesia In the OR  Requested by Surgeon: Yes   Start time: 09/22/2013 4:00 PM  End time: 09/22/2013 4:10 PM  Staffing  Anesthesiologist: Juliet Rude VPreanesthetic Checklist   Timeout Completed:  09/22/2013 4:00 PM  CSE  Patient monitoring: Pulse oximetry, EKG, NIBP and Nasal cannula O2      Patient position: sitting  Sterile Technique: Betadine, sterile drape, mask  and sterile gloves  Skin Local: bupivicaine 0.25%  Interspace: L2-3  Number of attempts: 1    Approach: midline    Injection technique: LOR air  Needle type: Touhy needle     Needle gauge: 17  CSF Return: No  Blood Return: No  Intrathecal space entered with Pencan  Needle Gauge 25 G  CSF Return: Yes   Blood Return:No  Paresthesia Pain: no    Catheter type: side hole    Catheter size: 20 G    Catheter at skin depth: 12 cm  CSF Return: No  Blood Return: No      Slow fractionated injection: no  Pressure Paresthesia: no  No Catheter IV/SA Signs or Symptoms    Assessment   Sensory level: T10  Block Outcome: patient tolerated procedure well, no complications and successful block  Additional Notes  LOR 7cm'  cath12cm

## 2013-09-22 NOTE — Progress Notes (Signed)
PROGRESS NOTE    Date Time: 09/22/2013 7:44 AM  Patient Name: [name], [name]      Assessment:    Right TKA Irrigation and Debridement and Poly Exchange 09/05/13    Plan:   + Wound drainage,Rresection arthroplasty and antibiotic spacer on Monday 09/15/13  Plan for surgery on 09/22/13, antibiotic spacer exchange and plastic surgery coverage  Transfused 2U PRBC 09/20/13 - H/H 9.8  PT - WBAT in Extension with brace on, No bending knee  DVT: ASA 325 bid  ID: Pan-Sensitive Proteus ssp Final Recommendations Cipro 750 bid for 6 weeks  Wound vac in place - changed 09/21/13  D/c planning pending outcome of explantation    Subjective:   Feels well, pain controlled, no current complaints.    Medications:     Current Facility-Administered Medications   Medication Dose Route Frequency   . amLODIPine  5 mg Oral Daily   . celecoxib  200 mg Oral Daily   . ciprofloxacin  750 mg Oral Q12H SCH   . cloNIDine  0.1 mg Oral Daily   . docusate sodium  100 mg Oral Q12H SCH   . ferrous sulfate 324 mg-ascorbic acid 500 mg combo dose   Oral Daily   . gabapentin  300 mg Oral BID   . hydrochlorothiazide  25 mg Oral Daily   . nebivolol  5 mg Oral QHS   . oxyCODONE  10 mg Oral Q12H SCH   . polycarbophil  1,250 mg Oral Daily   . polyethylene glycol  17 g Oral Daily   . potassium chloride  10 mEq Oral Daily   . Senna  17.2 mg Oral QHS   . tiotropium  18 mcg Inhalation QAM   . traMADol  50 mg Oral Q6H SCH       Physical Exam:     Filed Vitals:    09/21/13 2104   BP: 144/49   Pulse: 72   Temp: 98.2 F (36.8 C)   Resp: 18   SpO2: 100%       Intake and Output Summary (Last 24 hours) at Date Time  No intake or output data in the 24 hours ending 09/22/13 0744    General appearance - alert, well appearing, and in no distress  Musculoskeletal - Right knee  Dressing with SS drainage along distal 1/2. Wound well approximated without breakdown.  Fires quad/TA/EHL/GSC  SILT SP/DP/TN  WWP distally  Calves soft, nontender bilateral    Physical Therapy:   Pain  Score: 5-moderate pain (09/22/13 0458)  Weight Bearing Percent: 100  (09/21/13 0900)  Ambulation Distance (Feet): 30 Feet (09/21/13 0900)  Right Lower Extremity ROM:  (Knee brace locked in extension) (09/16/13 1145)  Left Lower Extremity ROM: within functional limits (09/16/13 1145)         Knee AROM : no knee flexion (09/07/13 0915)    Labs:         Recent CBC   Recent Labs   Basename 09/22/13 0444    RBC 3.63*    HGB 9.8*    HCT 31.0*    LABPLAT --     Recent BMP   Recent Labs   [facility] 09/22/13 0444    GLU 102*    BUN 19    CREAT 0.8    CA 8.5    NA 140    K 4.0    CL 103    CO2 25     Recent PT/INR   Recent Labs   [facility] 09/21/13  1028    INR 1.1       Rads:   Radiological Procedure reviewed.    Signed by: Garey Ham

## 2013-09-22 NOTE — Anesthesia Preprocedure Evaluation (Addendum)
Anesthesia Evaluation    AIRWAY    Mallampati: II    TM distance: >3 FB  Neck ROM: full  Mouth Opening:full   CARDIOVASCULAR    cardiovascular exam normal       DENTAL    No notable dental hx     PULMONARY    pulmonary exam normal     OTHER FINDINGS              PSS Anesthesia Comments: ASA 3 ( COPD)    Pre-evaluation Note Incomplete - DO NOT USE FOR CLINICAL DECISIONS    Anesthesia Plan    ASA 3     CSE                                 informed consent obtained    ECG reviewed  pertinent labs reviewed

## 2013-09-22 NOTE — Progress Notes (Signed)
[facility] PROGRESS NOTE    Date Time: 09/22/2013 10:17 AM  Patient Name: Carron Curie L        Subjective:   "I'm feeling fine." Daughter with patient.    Medications:      Scheduled Meds: PRN Meds:           amLODIPine 5 mg Oral Daily   celecoxib 200 mg Oral Daily   ciprofloxacin 750 mg Oral Q12H SCH   cloNIDine 0.1 mg Oral Daily   docusate sodium 100 mg Oral Q12H SCH   ferrous sulfate 324 mg-ascorbic acid 500 mg combo dose  Oral Daily   gabapentin 300 mg Oral BID   hydrochlorothiazide 25 mg Oral Daily   nebivolol 5 mg Oral QHS   oxyCODONE 10 mg Oral Q12H SCH   polycarbophil 1,250 mg Oral Daily   polyethylene glycol 17 g Oral Daily   potassium chloride 10 mEq Oral Daily   Senna 17.2 mg Oral QHS   tiotropium 18 mcg Inhalation QAM   traMADol 50 mg Oral Q6H SCH         Continuous Infusions:         albuterol 2.5 mg Q4H PRN   bisacodyl 10 mg QD PRN   diphenhydrAMINE 25 mg Q6H PRN   HYDROcodone-acetaminophen 1 tablet Q4H PRN   HYDROcodone-acetaminophen 2 tablet Q6H PRN   HYDROcodone-acetaminophen 2 tablet Q4H PRN   HYDROmorphone 0.5 mg Q1H PRN   hydrOXYzine 25 mg Q12H PRN   ondansetron 4 mg Q8H PRN   ondansetron 4 mg Q8H PRN   senna-docusate 2 tablet BID PRN   zolpidem 5 mg QHS PRN         I personally reviewed all of the medications      Review of Systems:   A comprehensive review of systems was obtained from chart review and the patient  General ROS: negative  Ophthalmic ROS: negative  Endocrine ROS: negative  Respiratory ROS: no cough, denies shortness of breath  Cardiovascular ROS: no chest pain or some dyspnea on exertion  Gastrointestinal ROS: no abdominal pain, no N/V, regular +BMs, last on 12/14  Genito-Urinary ROS: no c/o dysuria, trouble voiding, or hematuria  Musculoskeletal ROS: right knee pain- managed with pain meds  Neurological ROS: no TIA or stroke symptoms, denies dizziness      Physical Exam:     Filed Vitals:    09/20/13 2039 09/21/13 1551 09/21/13 2104 09/22/13 0818   BP: 145/54  126/50 144/49 117/55   Pulse: 65 60 72 64   Temp: 97.5 F (36.4 C) 97 F (36.1 C) 98.2 F (36.8 C) 95.5 F (35.3 C)   TempSrc:  Oral  Oral   Resp: 18 18 18 17    Height:       Weight:       SpO2: 97% 94% 100% 100%         Intake and Output Summary (Last 24 hours) at Date Time  No intake or output data in the 24 hours ending 09/22/13 1017    General appearance - alert, well appearing, and in no distress  Mental status - alert, oriented to person, place, and time  Chest -clear lung fields, no rales or rhonchi, symmetric air entry  Heart - normal rate, regular rhythm, normal S1, S2, no murmurs  Abdomen - bowel sounds normal, abdomen is soft, nontender, non distended, obese  Neurological - alert, oriented, normal speech, no focal findings or movement disorder noted  Musculoskeletal - laying in bed  Extremities - peripheral pulses normal, no pedal edema, no clubbing or cyanosis  RLE w/ brace and ace wrap dressing      Labs:       Lab 09/22/13 0444 09/21/13 1028 09/18/13 0454   GLU 102* 101* 92   BUN 19 10 14    CREAT 0.8 0.7 0.7   CA 8.5 8.6 8.3   NA 140 140 141   K 4.0 3.8 4.1   CL 103 102 105   CO2 25 28 29    ALB 2.6* -- 2.2*   PHOS -- -- --   MG -- -- 1.7   EGFR >60.0 >60.0 >60.0   PT -- 14.2 --   PTT -- -- --   INR -- 1.1 --         Lab 09/22/13 0444 09/18/13 0454   AST 18 17   ALT 7 <6   ALKPHOS 93 88   ALB 2.6* 2.2*   BILITOTAL 0.7 0.5   AMY -- --   LIP -- --         Lab 09/22/13 0444 09/21/13 1028 09/18/13 0454   WBC 11.41* 10.75 15.26*   HGB 9.8* 9.7* 7.6*   HCT 31.0* 30.3* 24.3*   MCV 85.4 84.6 84.7   MCH 27.0* 27.1* 26.5*   MCHC 31.6* 32.0 31.3*   RDW 16* 16* 15   MPV 9.3* 9.4 9.6       No results found for this basename: TSH,FREET3,FREET4 in the last 168 hours        Rads:     Radiology Results (24 Hour)     ** No Results found for the last 24 hours. **                  Assessment and Plan:      Patient Active Problem List   Diagnosis   . S/p R TKA, readmitted 09/05/13. S/p R TKA I&D and Poly Exchange, s/p  Revision- Continue plan per ID and Ortho. Surgery planned for today with Ortho and Plastic Surgery.     Poor wound healing/Increased nutritional need- on Boost Breeze and Juven.- Resume post-op   . Low back pain - controlled   . Chronic obstructive pulmonary disease- On spriva and prn albuterol nebs. Stable, continue current meds.    . Hypertension-  BP stable. Continue current meds and follow   Post-operative Acute Anemia, secondary to blood loss, s/p surgery. Continue Fe/Vit C. Pt s/p transfusions. S/p transfusions 12/13, per Ortho. H/H improved.      Signed by: Sheryle Hail, NP

## 2013-09-22 NOTE — Brief Op Note (Signed)
BRIEF OP NOTE    Date Time: 09/22/2013 9:52 PM    Patient Name:   [name]    Date of Operation:   09/05/2013 - 09/22/2013    Providers Performing:   Surgeon(s):  Ahmed Prima, PA    Harnoor Kohles, Kelvin Cellar, MD      Assistant (s):   Azucena Fallen, RN - Circulator  Verne Carrow - Scrub Person  [place] - Second Scrub Person  Wright-Slekis, Lynnea Maizes, RN - Relief Circulator  [place] E - Relief Scrub  Princess Perna, RN - Relief Circulator  [place], [place] - Relief Scrub    Operative Procedure:   Procedure(s):  Right medial gastrocnemius muscle flap    Preoperative Diagnosis:   Pre-Op Diagnosis Codes:     * 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 Item Serial No. Manufacturer Lot No. LRB No. Used Action   CEMENT BONE SNGL PALACOS 1X40 - [id number] Cement CEMENT BONE SNGL PALACOS 1X40   [employer] [id number] Right 3 Implanted       Drains:   Drains: Yes, Drain #1: [employer]att Round 19 Fr and Drain #2: [employer]att Round 19 Fr    Specimens:       Findings:   Right medial gastroc muscle flap used to cover abx spacer and joint.     Complications:   none      Signed by: Ernie Avena, MD                                                                           [facility]

## 2013-09-22 NOTE — Plan of Care (Signed)
Pt transported to OR for procedure via bed, family at bedside,pt to be transferred to [facility] after procedure.

## 2013-09-22 NOTE — Anesthesia Postprocedure Evaluation (Signed)
Anesthesia Post Evaluation    Patient: [name]    Procedures performed: Procedure(s) with comments:  ARTHROPLASTY, KNEE, REVISION TOTAL  DEBRIDEMENT, MUSCLE FLAP CLOSURE - DEBRIDEMENT & MUSCLE FLAP KNEE RT  GRAFT, SKIN  SPLIT THICKNESS, (MEDICAL)    Anesthesia type: Combined Spinal/Epidural    Patient location:Phase I PACU    Last vitals:   Filed Vitals:    09/22/13 1700   BP:    Pulse: 66   Temp:    Resp:    SpO2: 99%       Post pain: Patient not complaining of pain, continue current therapy      Mental Status:alert     Respiratory Function: tolerating nasal cannula    Cardiovascular: stable    Nausea/Vomiting: patient not complaining of nausea or vomiting    Hydration Status: adequate    Post assessment: no apparent anesthetic complications

## 2013-09-23 ENCOUNTER — Encounter: Payer: Self-pay | Admitting: Orthopaedic Surgery

## 2013-09-23 LAB — CBC
Hematocrit: 24.4 % — ABNORMAL LOW (ref 37.0–47.0)
Hgb: 7.8 g/dL — ABNORMAL LOW (ref 12.0–16.0)
MCH: 27.2 pg — ABNORMAL LOW (ref 28.0–32.0)
MCHC: 32 g/dL (ref 32.0–36.0)
MCV: 85 fL (ref 80.0–100.0)
MPV: 9 fL — ABNORMAL LOW (ref 9.4–12.3)
Nucleated RBC: 0 (ref 0–1)
Platelets: 331 (ref 140–400)
RBC: 2.87 — ABNORMAL LOW (ref 4.20–5.40)
RDW: 16 % — ABNORMAL HIGH (ref 12–15)
WBC: 11.63 — ABNORMAL HIGH (ref 3.50–10.80)

## 2013-09-23 LAB — GFR: EGFR: >60

## 2013-09-23 LAB — BASIC METABOLIC PANEL
BUN: 13 mg/dL (ref 7–21)
CO2: 25 mEq/L (ref 22–29)
Calcium: 8.1 mg/dL (ref 7.9–10.6)
Chloride: 103 mEq/L (ref 98–107)
Creatinine: 0.6 mg/dL (ref 0.6–1.0)
Glucose: 89 mg/dL (ref 70–100)
Potassium: 3.9 mEq/L (ref 3.5–5.1)
Sodium: 140 mEq/L (ref 136–145)

## 2013-09-23 MED ORDER — BISOPROLOL FUMARATE 5 MG PO TABS
5.0000 mg | ORAL_TABLET | Freq: Every day | ORAL | Status: DC
Start: 2013-09-23 — End: 2013-10-03
  Administered 2013-09-23 – 2013-10-02 (×11): 5 mg via ORAL
  Filled 2013-09-23 (×3): qty 1

## 2013-09-23 MED ORDER — ALBUTEROL-IPRATROPIUM 2.5-0.5 (3) MG/3ML IN SOLN
3.0000 mL | Freq: Four times a day (QID) | RESPIRATORY_TRACT | Status: DC
Start: 2013-09-23 — End: 2013-09-23
  Administered 2013-09-23: 3 mL via RESPIRATORY_TRACT
  Filled 2013-09-23: qty 3

## 2013-09-23 MED ORDER — CEFAZOLIN SODIUM-DEXTROSE 2-3 GM-% IV SOLR
2.0000 g | Freq: Three times a day (TID) | INTRAVENOUS | Status: DC
Start: 2013-09-23 — End: 2013-09-29
  Administered 2013-09-23 – 2013-09-29 (×19): 2 g via INTRAVENOUS
  Filled 2013-09-23 (×21): qty 50

## 2013-09-23 NOTE — Progress Notes (Signed)
PROGRESS NOTE    Date Time: 09/23/2013 6:59 AM  Patient Name: [name], [name]      Assessment:    Right TKA Irrigation and Debridement and Poly Exchange 09/05/13, 12/8 resection and antibiotic spacer, 12/15 antibiotic spacer exchange and plastic surgery coverage. NWB RLE NO knee ROM    Plan:   PT - Non weight bearing in Extension with brace on, No bending knee, bed rest  DVT: ASA 325 bid  ID: Pan-Sensitive Proteus ssp Final Recommendations Cipro 750 bid for 6 weeks  Wound vac in place - changed 09/21/13  D/c planning pending- soft tissue healing    Subjective:   Feels well, pain controlled, no current complaints.    Medications:     Current Facility-Administered Medications   Medication Dose Route Frequency   . albuterol-ipratropium  3 mL Nebulization Q6H SCH   . amLODIPine  5 mg Oral Daily   . bisoprolol  5 mg Oral Daily   . ceFAZolin  2 g Intravenous Q8H SCH   . celecoxib  200 mg Oral Daily   . cloNIDine  0.1 mg Oral Daily   . docusate sodium  100 mg Oral Q12H SCH   . ferrous sulfate 324 mg-ascorbic acid 500 mg combo dose   Oral Daily   . gabapentin  300 mg Oral BID   . hydrochlorothiazide  25 mg Oral Daily   . [COMPLETED] labetalol  20 mg Intravenous Once   . oxyCODONE       . oxyCODONE  10 mg Oral Q12H SCH   . polycarbophil  1,250 mg Oral Daily   . polyethylene glycol  17 g Oral Daily   . potassium chloride  10 mEq Oral Daily   . Senna  17.2 mg Oral QHS   . tiotropium  18 mcg Inhalation QAM   . traMADol  50 mg Oral Q6H SCH   . vancomycin  1,000 mg Intravenous On Call to OR   . [DISCONTINUED] ciprofloxacin  750 mg Oral Q12H SCH   . [DISCONTINUED] nebivolol  5 mg Oral QHS       Physical Exam:     Filed Vitals:    09/23/13 0414   BP: 130/62   Pulse: 79   Temp: 98.3 F (36.8 C)   Resp: 18   SpO2: 98%       Intake and Output Summary (Last 24 hours) at Date Time    Intake/Output Summary (Last 24 hours) at 09/23/13 0659  Last data filed at 09/23/13 4401   Gross per 24 hour   Intake 3407.8 ml   Output   1375 ml   Net  2032.8 ml       General appearance - alert, well appearing, and in no distress  Musculoskeletal - Right knee  Dressing intact drains inplace  SILT SP/DP/TN  WWP distally  Calves soft, nontender bilateral    Physical Therapy:   Pain Score: 4-moderate pain (09/23/13 0645)  Weight Bearing Percent: 100  (09/21/13 0900)  Ambulation Distance (Feet): 30 Feet (09/21/13 0900)  Right Lower Extremity ROM:  (Knee brace locked in extension) (09/16/13 1145)  Left Lower Extremity ROM: within functional limits (09/16/13 1145)         Knee AROM : no knee flexion (09/07/13 0915)    Labs:         Recent CBC   No results found for this basename: WHITEBLOODCE,RBC,HGB,HCT,,LABPLAT, NRBCA, REFLX, ANRBA in the last 24 hours  Recent BMP   No results found for this  basename: GLU,BUN,CREAT,CA,NA,K,CL,CO2,AGAP in the last 24 hours  Recent PT/INR   No results found for this basename: PTI,INR,COUM,COUMP in the last 24 hours    Rads:   Radiological Procedure reviewed.    Signed by: Cheryle Horsfall

## 2013-09-23 NOTE — Progress Notes (Signed)
Oxygen saturation on room air while awake 93%; however, when oxygen saturation tends to drop to 88-89% when asleep.Oxygen saturation of 1L/NC 95% when asleep and 98% when awake.  Oxygen at 1L/NC maintained on patient.Incentive spirometer use encouraged hourly; however patient only able to achieve 500.

## 2013-09-23 NOTE — Addendum Note (Signed)
Addendum  created 09/23/13 1412 by Consuello Bossier, MD    Modules edited:Notes Section

## 2013-09-23 NOTE — Plan of Care (Signed)
Problem: Day of Surgery- Knee Surgery  Goal: Hemodynamic Stability  Outcome: Progressing  Vital signs being monitored.BP at its baseline now.  Goal: Pain at adequate level as identified by patient  Outcome: Progressing  Pt received from PACU per bed ~ 0015,alert and oriented x3,can be sleepy but easily arousable,no acute resp distress,on CADD-Fentanyl/Ropivacaine @ 12/4/20min,pain progressing to comfort function goal of 3/10 with current pain regimen.Ice therapy in progress.Positioned with pillows.Oriented to room set-up and call light system,also to the use of CADD.

## 2013-09-23 NOTE — Plan of Care (Signed)
Problem: Day 1 Post-op- Knee Surgery  Goal: Hemodynamic Stability  Outcome: Not Progressing  H/H 7.8/24.4. VSS. Dr. Doy Hutching notified; no orders received. Patient with poor PO intake; Sloan Leiter, NP notified; no orders received. Fluids provided and encouraged. Continue to monitor.

## 2013-09-23 NOTE — Anesthesia Postprocedure Evaluation (Signed)
Anesthesia Post Evaluation    Patient: [name]    Procedures performed: Procedure(s) with comments:  ARTHROPLASTY, KNEE, REVISION TOTAL  DEBRIDEMENT, MUSCLE FLAP CLOSURE - DEBRIDEMENT & MUSCLE FLAP KNEE RT  GRAFT, SKIN  SPLIT THICKNESS, (MEDICAL)    Anesthesia type: Combined Spinal/Epidural    Patient location: Med Surgical Floor    Post pain: Patient not complaining of pain. Pain well-controlled overnight with PCEA.  Pain meds per primary team now that catheter removed.    Post assessment: no apparent anesthetic complications and no reportable events.  Epidural catheter d/c'ed, tip intact.  No neuro issues.    Last vitals:   Filed Vitals:    09/23/13 0937   BP: 137/60   Pulse: 82   Temp:    Resp: 20   SpO2:        Post vital signs: stable    Level of consciousness: awake and alert     Respiratory: tolerating room air    Cardiovascular: stable    Other complications: none.

## 2013-09-23 NOTE — Progress Notes (Signed)
[facility] PROGRESS NOTE    Date Time: 09/23/2013 8:23 AM  Patient Name: [name]        Subjective:   "I'm feeling OK. I'm having a lot of pain." Daughter with patient.    Medications:      Scheduled Meds: PRN Meds:           albuterol-ipratropium 3 mL Nebulization Q6H SCH   amLODIPine 5 mg Oral Daily   bisoprolol 5 mg Oral Daily   ceFAZolin 2 g Intravenous Q8H SCH   celecoxib 200 mg Oral Daily   cloNIDine 0.1 mg Oral Daily   docusate sodium 100 mg Oral Q12H SCH   ferrous sulfate 324 mg-ascorbic acid 500 mg combo dose  Oral Daily   gabapentin 300 mg Oral BID   hydrochlorothiazide 25 mg Oral Daily   [COMPLETED] labetalol 20 mg Intravenous Once   oxyCODONE      oxyCODONE 10 mg Oral Q12H SCH   polycarbophil 1,250 mg Oral Daily   polyethylene glycol 17 g Oral Daily   potassium chloride 10 mEq Oral Daily   Senna 17.2 mg Oral QHS   tiotropium 18 mcg Inhalation QAM   traMADol 50 mg Oral Q6H SCH   vancomycin 1,000 mg Intravenous On Call to OR   [DISCONTINUED] ceFAZolin 2 g Intravenous Q8H [facility]   [DISCONTINUED] ciprofloxacin 750 mg Oral Q12H [facility]   [DISCONTINUED] nebivolol 5 mg Oral QHS         Continuous Infusions:       . ropivacaine 0.125% + fentaNYL 2 mcg/mL 12 mL/hr at 09/23/13 0412         albuterol 2.5 mg Q4H PRN   bisacodyl 10 mg QD PRN   diphenhydrAMINE 25 mg Q6H PRN   fentaNYL 25 mcg Q5 Min PRN   HYDROcodone-acetaminophen 1 tablet Q4H PRN   [EXPIRED] HYDROcodone-acetaminophen 1 tablet Once PRN   [EXPIRED] HYDROcodone-acetaminophen 1 tablet Once PRN   HYDROcodone-acetaminophen 2 tablet Q6H PRN   HYDROcodone-acetaminophen 2 tablet Q4H PRN   HYDROmorphone 0.5 mg Q1H PRN   HYDROmorphone 0.5 mg Q5 Min PRN   hydrOXYzine 25 mg Q12H PRN   meperidine 25 mg Q2H PRN   metoclopramide 10 mg Once PRN   naloxone 0.1 mg PRN   ondansetron 4 mg Q8H PRN   [EXPIRED] ondansetron 4 mg Once PRN   ondansetron 4 mg Once PRN   ondansetron 4 mg Q8H PRN   oxyCODONE-acetaminophen 1 tablet Q4H PRN   senna-docusate 2 tablet  BID PRN   zolpidem 5 mg QHS PRN   [DISCONTINUED] bacitracin  PRN   [DISCONTINUED] fentaNYL 25 mcg Q5 Min PRN   [DISCONTINUED] HYDROmorphone 0.5 mg Q5 Min PRN   [DISCONTINUED] meperidine 25 mg Q2H PRN   [DISCONTINUED] metoclopramide 10 mg Once PRN   [DISCONTINUED] ondansetron 4 mg Once PRN   [DISCONTINUED] tobramycin  PRN   [DISCONTINUED] Vancomycin HCl  PRN         I personally reviewed all of the medications      Review of Systems:   A comprehensive review of systems was obtained from chart review and the patient  General ROS: negative  Ophthalmic ROS: negative  Endocrine ROS: negative  Respiratory ROS: no cough, denies shortness of breath  Cardiovascular ROS: no chest pain or some dyspnea on exertion  Gastrointestinal ROS: no abdominal pain, no N/V, regular +BMs, last on 12/14  Genito-Urinary ROS: no c/o dysuria, trouble voiding, or hematuria  Musculoskeletal ROS: right knee pain- managed  with pain meds  Neurological ROS: no TIA or stroke symptoms, denies dizziness      Physical Exam:     Filed Vitals:    09/23/13 0100 09/23/13 0200 09/23/13 0300 09/23/13 0414   BP: 151/65 148/67 148/64 130/62   Pulse: 77 71 77 79   Temp:    98.3 F (36.8 C)   TempSrc:    Oral   Resp:    18   Height:       Weight:       SpO2:    98%         Intake and Output Summary (Last 24 hours) at Date Time    Intake/Output Summary (Last 24 hours) at 09/23/13 5409  Last data filed at 09/23/13 8119   Gross per 24 hour   Intake 3407.8 ml   Output   1375 ml   Net 2032.8 ml       General appearance - awake, well appearing, and in no distress, c/o pain- just received meds  Mental status - alert, oriented to person, place, and time  Chest -clear lung fields, no wheezes appreciated, no rales or rhonchi, symmetric air entry  Heart - normal rate, regular rhythm, normal S1, S2, no murmurs  Abdomen - bowel sounds normal, abdomen is soft, nontender, non distended, obese  Neurological - alert, oriented, normal speech, no focal findings or movement  disorder noted  Musculoskeletal - laying in bed  Extremities - peripheral pulses normal, no pedal edema, no clubbing or cyanosis  RLE w/ brace and kling wrap dressing      Labs:       Lab 09/22/13 0444 09/21/13 1028 09/18/13 0454   GLU 102* 101* 92   BUN 19 10 14    CREAT 0.8 0.7 0.7   CA 8.5 8.6 8.3   NA 140 140 141   K 4.0 3.8 4.1   CL 103 102 105   CO2 25 28 29    ALB 2.6* -- 2.2*   PHOS -- -- --   MG -- -- 1.7   EGFR >60.0 >60.0 >60.0   PT -- 14.2 --   PTT -- -- --   INR -- 1.1 --         Lab 09/22/13 0444 09/18/13 0454   AST 18 17   ALT 7 <6   ALKPHOS 93 88   ALB 2.6* 2.2*   BILITOTAL 0.7 0.5   AMY -- --   LIP -- --         Lab 09/22/13 0444 09/21/13 1028 09/18/13 0454   WBC 11.41* 10.75 15.26*   HGB 9.8* 9.7* 7.6*   HCT 31.0* 30.3* 24.3*   MCV 85.4 84.6 84.7   MCH 27.0* 27.1* 26.5*   MCHC 31.6* 32.0 31.3*   RDW 16* 16* 15   MPV 9.3* 9.4 9.6       No results found for this basename: TSH,FREET3,FREET4 in the last 168 hours        Rads:     Radiology Results (24 Hour)     ** No Results found for the last 24 hours. **                  Assessment and Plan:      Patient Active Problem List   Diagnosis   . S/p R TKA, readmitted 09/05/13. S/p R TKA I&D and Poly Exchange, s/p Revision- Continue plan per ID and Ortho. S/p gastrocnemius muscle flap- continue plan per  Ortho, ID, and Plastic Surgery.     Poor wound healing/Increased nutritional need- on Boost Breeze and Juven for supplementation   . Low back pain - controlled   . Chronic obstructive pulmonary disease- Continue spriva, d/c duoneb. Continue albuterol nebs prn. Currently stable.    . Hypertension-  BP stable. Continue current meds and follow   Post-operative Acute Anemia, secondary to blood loss, s/p surgery. Continue Fe/Vit C. Pt s/p transfusions. H/H stable.  Post-operative pain- treat PRN per Ortho      Signed by: Sheryle Hail, NP

## 2013-09-23 NOTE — Plan of Care (Signed)
Problem: Day 1 Post-op- Knee Surgery  Goal: Hemodynamic Stability  Outcome: Not Progressing  H/H 7.8/24.4. VSS.  Dr. Doy Hutching notified; no orders received. Patient with poor PO intake; Sloan Leiter, NP notified; no orders received. Fluids  provided and encouraged. Continue to monitor.  Goal: Mobility/activity is maintained at optimum level for patient  Outcome: Not Progressing  Patient on bedrest as ordered.

## 2013-09-23 NOTE — Plan of Care (Signed)
Problem: Health Promotion  Goal: Knowledge - health resources  Extent of understanding and conveyed about healthcare resources.   Intervention: Discharge planning  Faxed clinical update to Elena at [employer].

## 2013-09-24 LAB — TYPE AND SCREEN
AB Screen Gel: NEGATIVE
ABO Rh: B POS

## 2013-09-24 LAB — CBC
Hematocrit: 23.6 % — ABNORMAL LOW (ref 37.0–47.0)
Hgb: 7.5 g/dL — ABNORMAL LOW (ref 12.0–16.0)
MCH: 27.3 pg — ABNORMAL LOW (ref 28.0–32.0)
MCHC: 31.8 g/dL — ABNORMAL LOW (ref 32.0–36.0)
MCV: 85.8 fL (ref 80.0–100.0)
MPV: 9.4 fL (ref 9.4–12.3)
Nucleated RBC: 0 (ref 0–1)
Platelets: 331 (ref 140–400)
RBC: 2.75 — ABNORMAL LOW (ref 4.20–5.40)
RDW: 16 % — ABNORMAL HIGH (ref 12–15)
WBC: 12.77 — ABNORMAL HIGH (ref 3.50–10.80)

## 2013-09-24 LAB — BASIC METABOLIC PANEL
BUN: 9 mg/dL (ref 7–21)
CO2: 29 mEq/L (ref 22–29)
Calcium: 8.4 mg/dL (ref 7.9–10.6)
Chloride: 103 mEq/L (ref 98–107)
Creatinine: 0.7 mg/dL (ref 0.6–1.0)
Glucose: 108 mg/dL — ABNORMAL HIGH (ref 70–100)
Potassium: 4.1 mEq/L (ref 3.5–5.1)
Sodium: 139 mEq/L (ref 136–145)

## 2013-09-24 LAB — GFR: EGFR: >60

## 2013-09-24 MED ORDER — FUROSEMIDE 10 MG/ML IJ SOLN
20.0000 mg | Freq: Once | INTRAMUSCULAR | Status: AC
Start: 2013-09-24 — End: 2013-09-25
  Administered 2013-09-25: 20 mg via INTRAVENOUS
  Filled 2013-09-24: qty 4

## 2013-09-24 NOTE — Plan of Care (Signed)
Problem: Day 2 Post-op- Knee Surgery  Goal: Hemodynamic Stability  Outcome: Progressing  H/H 7.5/23.6.  VSS.  Orders received.  Patient to receive two units PRBC's with Lasix 20mg  IV between units tonight.    Goal: Patient will maintain normal GI status (Post Op Day 2- Knee Surgery)  Outcome: Progressing  Patient's appetite better today than yesterday approximately  25% of her tray. Continue to encourage family to bring food from home that patient prefers.    Goal: Mobility/activity is maintained at optimum level for patient  Outcome: Progressing  Patient on bedrest.  Patient encouraged to perform ankle pump while in bed and move left leg as tolerated.

## 2013-09-24 NOTE — Progress Notes (Signed)
[facility] PROGRESS NOTE    Date Time: 09/24/2013 7:56 AM  Patient Name: [name],[name]        Subjective:   "I'm doing better today.    Daughter with patient.    Medications:      Scheduled Meds: PRN Meds:           amLODIPine 5 mg Oral Daily   bisoprolol 5 mg Oral Daily   ceFAZolin 2 g Intravenous Q8H SCH   celecoxib 200 mg Oral Daily   cloNIDine 0.1 mg Oral Daily   docusate sodium 100 mg Oral Q12H SCH   ferrous sulfate 324 mg-ascorbic acid 500 mg combo dose  Oral Daily   gabapentin 300 mg Oral BID   hydrochlorothiazide 25 mg Oral Daily   oxyCODONE 10 mg Oral Q12H SCH   polycarbophil 1,250 mg Oral Daily   polyethylene glycol 17 g Oral Daily   potassium chloride 10 mEq Oral Daily   Senna 17.2 mg Oral QHS   tiotropium 18 mcg Inhalation QAM   traMADol 50 mg Oral Q6H SCH   [COMPLETED] vancomycin 1,000 mg Intravenous On Call to OR   [DISCONTINUED] albuterol-ipratropium 3 mL Nebulization Q6H [facility]   [DISCONTINUED] ceFAZolin 2 g Intravenous Q8H SCH         Continuous Infusions:       . [EXPIRED] ropivacaine 0.125% + fentaNYL 2 mcg/mL Stopped (09/23/13 1131)         albuterol 2.5 mg Q4H PRN   bisacodyl 10 mg QD PRN   diphenhydrAMINE 25 mg Q6H PRN   fentaNYL 25 mcg Q5 Min PRN   HYDROcodone-acetaminophen 1 tablet Q4H PRN   HYDROcodone-acetaminophen 2 tablet Q6H PRN   HYDROcodone-acetaminophen 2 tablet Q4H PRN   HYDROmorphone 0.5 mg Q1H PRN   HYDROmorphone 0.5 mg Q5 Min PRN   hydrOXYzine 25 mg Q12H PRN   meperidine 25 mg Q2H PRN   metoclopramide 10 mg Once PRN   naloxone 0.1 mg PRN   ondansetron 4 mg Q8H PRN   ondansetron 4 mg Once PRN   ondansetron 4 mg Q8H PRN   oxyCODONE-acetaminophen 1 tablet Q4H PRN   senna-docusate 2 tablet BID PRN   zolpidem 5 mg QHS PRN         I personally reviewed all of the medications      Review of Systems:   A comprehensive review of systems was obtained from chart review and the patient  General ROS: negative  Ophthalmic ROS: negative  Endocrine ROS: negative  Respiratory ROS: no  cough, denies shortness of breath  Cardiovascular ROS: no chest pain or some dyspnea on exertion  Gastrointestinal ROS: no abdominal pain, no N/V, better appetite today  Genito-Urinary ROS: no c/o currently  Musculoskeletal ROS: right knee pain- managed with pain meds  Neurological ROS: no TIA or stroke symptoms, denies dizziness      Physical Exam:     Filed Vitals:    09/23/13 0937 09/23/13 1559 09/23/13 2022 09/24/13 0525   BP: 137/60 141/62 126/55 140/57   Pulse: 82 74 70 69   Temp:  99.3 F (37.4 C) 98.1 F (36.7 C) 97.7 F (36.5 C)   TempSrc:  Oral     Resp: 20 16 18 18    Height:       Weight:       SpO2:  92% 98% 98%         Intake and Output Summary (Last 24 hours) at Date Time  Intake/Output Summary (Last 24 hours) at 09/24/13 0756  Last data filed at 09/24/13 1610   Gross per 24 hour   Intake  394.6 ml   Output   1410 ml   Net -1015.4 ml       General appearance - awake, well appearing, and in no distress, c/o pain- just received meds  Mental status - alert, oriented to person, place, and time  Chest -clear lung fields, no wheezes appreciated, no rales or rhonchi, symmetric air entry  Heart - normal rate, regular rhythm, normal S1, S2, no murmurs  Abdomen - bowel sounds normal, abdomen is soft, nontender, non distended, obese  Neurological - alert, oriented, normal speech, no focal findings or movement disorder noted  Musculoskeletal - laying in bed  Extremities - peripheral pulses normal, no pedal edema, no clubbing or cyanosis  RLE w/ brace and kling wrap dressing      Labs:       Lab 09/24/13 0424 09/23/13 0833 09/22/13 0444 09/21/13 1028 09/18/13 0454   GLU 108* 89 102* -- --   BUN 9 13 19  -- --   CREAT 0.7 0.6 0.8 -- --   CA 8.4 8.1 8.5 -- --   NA 139 140 140 -- --   K 4.1 3.9 4.0 -- --   CL 103 103 103 -- --   CO2 29 25 25  -- --   ALB -- -- 2.6* -- 2.2*   PHOS -- -- -- -- --   MG -- -- -- -- 1.7   EGFR >60.0 >60.0 >60.0 -- --   PT -- -- -- 14.2 --   PTT -- -- -- -- --   INR -- -- -- 1.1 --          Lab 09/22/13 0444 09/18/13 0454   AST 18 17   ALT 7 <6   ALKPHOS 93 88   ALB 2.6* 2.2*   BILITOTAL 0.7 0.5   AMY -- --   LIP -- --         Lab 09/24/13 0424 09/23/13 0833 09/22/13 0444   WBC 12.77* 11.63* 11.41*   HGB 7.5* 7.8* 9.8*   HCT 23.6* 24.4* 31.0*   MCV 85.8 85.0 85.4   MCH 27.3* 27.2* 27.0*   MCHC 31.8* 32.0 31.6*   RDW 16* 16* 16*   MPV 9.4 9.0* 9.3*       No results found for this basename: TSH,FREET3,FREET4 in the last 168 hours        Rads:     Radiology Results (24 Hour)     ** No Results found for the last 24 hours. **                  Assessment and Plan:      Patient Active Problem List   Diagnosis   . S/p R TKA, readmitted 09/05/13. S/p R TKA I&D and Poly Exchange, s/p Revision. S/p gastrocnemius muscle flap and skin graft- continue plan per  Ortho, ID, and Plastic Surgery.     Poor wound healing/Increased nutritional need- on Boost Breeze and Juven for supplementation   . Low back pain - controlled   . Chronic obstructive pulmonary disease- Continue spriva, d/c duoneb. Continue albuterol nebs prn. Currently stable. Continue to encourage IS use. Currently using IS up to 1500 ml   . Hypertension-  BP stable. Continue current meds and follow   Post-operative Acute Anemia, secondary to blood loss, s/p surgery. Continue Fe/Vit C. Pt  s/p transfusions. H/H stable. Currently asymptomatic.  Post-operative pain- treat PRN per Ortho      Signed by: Sheryle Hail, NP

## 2013-09-24 NOTE — Progress Notes (Signed)
PROGRESS NOTE    Date Time: 09/24/2013 6:51 AM  Patient Name: [name], [name]      Assessment:    Right TKA Irrigation and Debridement and Poly Exchange 09/05/13, 12/8 resection and antibiotic spacer, 12/15 antibiotic spacer exchange and plastic surgery coverage.     Plan:    PT - Non weight bearing in Extension with brace on, No bending knee, bed rest. NWB RLE NO knee ROM   DVT: ASA 325 bid   ID: Pan-Sensitive Proteus ssp Final Recommendations Cipro 750 bid for 6 weeks, on Ancef for now per ID   D/c planning pending- soft tissue healing  Appreciate plastics input re: gastroc flap      Subjective:   Pain controlled, tolerating diet, participating in PT/OT    Medications:     Current Facility-Administered Medications   Medication Dose Route Frequency   . amLODIPine  5 mg Oral Daily   . bisoprolol  5 mg Oral Daily   . ceFAZolin  2 g Intravenous Q8H SCH   . celecoxib  200 mg Oral Daily   . cloNIDine  0.1 mg Oral Daily   . docusate sodium  100 mg Oral Q12H SCH   . ferrous sulfate 324 mg-ascorbic acid 500 mg combo dose   Oral Daily   . gabapentin  300 mg Oral BID   . hydrochlorothiazide  25 mg Oral Daily   . oxyCODONE  10 mg Oral Q12H SCH   . polycarbophil  1,250 mg Oral Daily   . polyethylene glycol  17 g Oral Daily   . potassium chloride  10 mEq Oral Daily   . Senna  17.2 mg Oral QHS   . tiotropium  18 mcg Inhalation QAM   . traMADol  50 mg Oral Q6H SCH   . [COMPLETED] vancomycin  1,000 mg Intravenous On Call to OR   . [DISCONTINUED] albuterol-ipratropium  3 mL Nebulization Q6H SCH   . [DISCONTINUED] ceFAZolin  2 g Intravenous Q8H [facility]       Physical Exam:     Filed Vitals:    09/24/13 0525   BP: 140/57   Pulse: 69   Temp: 97.7 F (36.5 C)   Resp: 18   SpO2: 98%       Intake and Output Summary (Last 24 hours) at Date Time    Intake/Output Summary (Last 24 hours) at 09/24/13 0651  Last data filed at 09/24/13 0525   Gross per 24 hour   Intake  491.5 ml   Output   1410 ml   Net -918.5 ml       General appearance - alert and  oriented, in no acute distress  Dressing c/d/i  Fires TA/EHL/GSC  SILT SP/DP/TN  2+ DP pulse  Calves soft, nontender bilateral    Labs:     Recent CBC Recent Labs   Basename 09/24/13 0424    RBC 2.75*    HGB 7.5*    HCT 23.6*    LABPLAT --     Recent BMP Recent Labs   Basename 09/24/13 0424    GLU 108*    BUN 9    CREAT 0.7    CA 8.4    NA 139    K 4.1    CL 103    CO2 29     Recent PT/INR No results found for this basename: PTI,INR,COUM,COUMP in the last 24 hours      Signed by: Robb Matar

## 2013-09-24 NOTE — Plan of Care (Signed)
Problem: Potential for Compromised Skin Integrity  Goal: Nutritional status is improving  Monitor and assess patient for malnutrition (ex- brittle hair, bruises, dry skin, pale skin and conjunctiva, muscle wasting, smooth red tongue, and disorientation). Collaborate with interdisciplinary team and initiate plan and interventions as ordered. Monitor patient's weight and dietary intake as ordered or per policy. Utilize nutrition screening tool and intervene per policy. Determine patient's food preferences and provide high-protein, high-caloric foods as appropriate.   Outcome: Progressing  Patient on bedrest, encouraged to shift weight and slight position changes to relieve pressure on bony prominences. Encouraged to increase food and fluid intake.

## 2013-09-24 NOTE — Progress Notes (Signed)
Small amount of serous drainage noted on pillow case under right leg.  Dr. Ahmed Prima notified on rounds.  Dr. Campbell Lerner notified.  Order received to place pad underneath leg. Also received order from Dr. Campbell Lerner that patient may get out of bed to chair with right leg elevated if okay with Dr.  Ahmed Prima.  Will check with Dr. Ahmed Prima in am.

## 2013-09-25 LAB — CBC
Hematocrit: 30.5 % — ABNORMAL LOW (ref 37.0–47.0)
Hgb: 10.1 g/dL — ABNORMAL LOW (ref 12.0–16.0)
MCH: 28 pg (ref 28.0–32.0)
MCHC: 33.1 g/dL (ref 32.0–36.0)
MCV: 84.5 fL (ref 80.0–100.0)
MPV: 9.9 fL (ref 9.4–12.3)
Nucleated RBC: 0 (ref 0–1)
Platelets: 288 (ref 140–400)
RBC: 3.61 — ABNORMAL LOW (ref 4.20–5.40)
RDW: 15 % (ref 12–15)
WBC: 12.73 — ABNORMAL HIGH (ref 3.50–10.80)

## 2013-09-25 LAB — BASIC METABOLIC PANEL
BUN: 9 mg/dL (ref 7–21)
CO2: 28 mEq/L (ref 22–29)
Calcium: 8.4 mg/dL (ref 7.9–10.6)
Chloride: 103 mEq/L (ref 98–107)
Creatinine: 0.7 mg/dL (ref 0.6–1.0)
Glucose: 105 mg/dL — ABNORMAL HIGH (ref 70–100)
Potassium: 3.6 mEq/L (ref 3.5–5.1)
Sodium: 140 mEq/L (ref 136–145)

## 2013-09-25 LAB — GFR: EGFR: >60

## 2013-09-25 LAB — PREPARE RBC
RBC Leukoreduced: TRANSFUSED
RBC Leukoreduced: TRANSFUSED

## 2013-09-25 NOTE — Progress Notes (Signed)
Infectious Diseases  Progress Note     Impression:   68 yof s/p resection arthroplasty of right knee and spacer exchange and flap  12/18  Original Cx were Proteus mirabilis which has not grown out since.     Recommendations:   Continue IV Ancef for now. Intend on returning to PO Cipro for duration of TX.    Subjective:   No new Probs.    Review of Systems  Patient without nausea, vomiting, diarrhea, rash, cough, shortness of breath, abdominal or chest pain.      Objective:   BP 179/75  Pulse 71  Temp 98.2 F (36.8 C) (Oral)  Resp 18  Ht 1.6 m (5\' 3" )  Wt 112 kg (246 lb 14.6 oz)  BMI 43.75 kg/m2  SpO2 98%    Temp (24hrs), Avg:98.1 F (36.7 C), Min:97.5 F (36.4 C), Max:98.4 F (36.9 C)      General: awake, alert, oriented x 3; no acute distress.  Cardiovascular: regular rate and rhythm, no murmurs, rubs or gallops  Lungs: clear to auscultation bilaterally, without wheezing, rhonchi, or rales  Abdomen: soft, non-tender, non-distended; no palpable masses, no hepatosplenomegaly, normoactive bowel sounds, no rebound or guarding  Extremities: no clubbing, cyanosis,   Knee braced          Labs:    Lab 09/25/13 0619 09/24/13 0424 09/23/13 0833   WBC 12.73* 12.77* 11.63*   HGB 10.1* 7.5* 7.8*   HCT 30.5* 23.6* 24.4*   PLT 288 331 331   NEUTROPCT -- -- --   MONOPCT -- -- --       Lab 09/25/13 0619 09/24/13 0424 09/23/13 0833 09/22/13 0444   NA 140 139 140 --   K 3.6 4.1 3.9 --   CL 103 103 103 --   CO2 28 29 25  --   BUN 9 9 13  --   CREAT 0.7 0.7 0.6 --   CA 8.4 8.4 8.1 --   ALB -- -- -- 2.6*   PROT -- -- -- 5.9*   BILITOTAL -- -- -- 0.7   ALKPHOS -- -- -- 93   ALT -- -- -- 7   AST -- -- -- 18   GLU 105* 108* 89 --

## 2013-09-25 NOTE — Progress Notes (Signed)
Pt is feeling discouraged because her hospitalization is taking so long. Gave emotional and spiritual support (prayer) to pt and family member.

## 2013-09-25 NOTE — PT Progress Note (Signed)
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      Patient's medical condition is appropriate for Physical Therapy  intervention at this time.  Patient cleared by nurse to participate in PT session, nursing reports pt is OOB bed and ready to go back to bed.    Subjective: I'm a little tired.  I can't put any weight on this leg.  Patient is agreeable to participation in the therapy session. Nursing clears patient for therapy.     Pain Assessment  Pain Assessment: Numeric Scale (0-10)  Pain Score: 3-mild pain  POSS Score: Awake and Alert  Pain Intervention(s): Repositioned          Objective:  Patient is OOB to w/c in her room seen 3 Days Post-Op with IV, dressings, AVIs and 2 JP drains.       Sensation is intact              Functional Mobility  Scooting to EOB: Min assist to right    Transfers  Bed to Chair: Min assist to right (with 2 via a series of scoots)  Posterior Scoot Transfers: Independent         Therapeutic Exercise  Quad Sets: 25  Glute Sets: 25  Hip Abduction: 2 x 10  Ankle Pumps: 2 x 25       Self Care and Home Management:  Pt did not perform self care activities at this time.    Educated the patient  to role of physical therapy, plan of care, goals  of therapy and HEP, safety with mobility and ADLs, weight bearing precautions.    Patient is in bed with all needs provided and call bell within reach. RN notified of session outcome.    Assessment:   Assessment  Assessment: Decreased LE ROM;Decreased LE strength;Decreased endurance/activity tolerance;Impaired motor control;Decreased functional mobility  Prognosis: Good;With continued PT status post acute discharge;24 hour supervision recommended.   Continue to work toward goals.      Goals per Eval/Re-eval:     Goals  Goal Formulation: With patient;With patient/family  Time for Goal Acheivement: 7 visits  Goals: Select goal  Pt Will Go Supine To Sit: With minimal assist;Goal met  Pt Will Perform Sit to Stand: With minimal assist  Pt Will Transfer to Toilet: With minimal assist  Pt Will Transfer to Tub/Shower: With minimal assist  PT Will Demonstrate Car Transfer Technique: With minimal assist  Pt Will Ambulate: 1-10 feet;with rolling walker;With minimal assist  Pt Will Go Up / Down Stairs: 3-5 stairs;With minimal assist  Pt Will Perform Home Exer Program: With minimal  assist  Pt Will Perform LE Dressing w/Device: With min assist  Other Goal: Pt 10 % WB RLE with activity and with brace locked in extension          Plan:  Recommendation  Discharge Recommendation: Acute Rehab  DME Recommended for Discharge:  (None-has equipment at home)  PT - Next Visit Recommendation: 09/23/13  PT Frequency: twice a day    Continue plan of care.      Signature: Novella Olive, PT 09/25/2013 4:59 PM

## 2013-09-25 NOTE — Plan of Care (Signed)
Problem: Physical Therapy  Goal: Mobility level  Treatment/Interventions: Exercise;Gait training;Bed mobility;Patient/family training  PT Frequency: 7x/wk     Goals:   Goals  Goal Formulation: With patient  Time for Goal Acheivement: 7 visits  Goals: Select goal  Pt Will Go Supine To Sit: With stand by assist  Pt Will Perform Sit to Stand: With stand by assist  Pt Will Transfer to Toilet: Independently  Pt Will Transfer to Tub/Shower: With minimal assist  PT Will Demonstrate Car Transfer Technique: With minimal assist  Pt Will Ambulate: 101-150 feet;With stand by assist  Pt Will Go Up / Down Stairs: 3-5 stairs;With stand by assist  Pt Will Perform Home Exer Program: Independently  Pt Will Perform LE Dressing w/Device: With min assist    Outcome: Progressing    Assessment:   Assessment  Assessment: Decreased LE ROM;Decreased LE strength;Decreased endurance/activity tolerance;Impaired motor control;Decreased functional mobility  Prognosis: Good;With continued PT status post acute discharge;24 hour supervision recommended.   Continue to work toward goals.

## 2013-09-25 NOTE — OT Eval Note (Signed)
[facility]  [address]  [place], [place] [address]  [phone]    Occupational Therapy Evaluation    Patient: [name] MRN: [id number]   Unit: [facility] 4C Bed: N027/O536.64    Time of treatment: Time Calculation  OT Received On: 09/25/13  Start Time: 1015  Stop Time: 1055  Time Calculation (min): 40 min    Consult received for [name] for OT evaluation and treatment.  Patient's medical condition is appropriate for Occupational Therapy  intervention at this time.    Medical Diagnosis: Infection of total knee replacement, initial encounter [996.66, V43.65] (INFECTED RT KNEE)  Total knee replacement status [V43.65] (Total knee replacement status)    History of Present Illness: [name] is a 77 y.o. female admitted on  09/05/2013 with   "Right TKA Irrigation and Debridement and Poly Exchange 09/05/13, 12/8 resection and antibiotic spacer, 12/15 antibiotic spacer exchange and plastic surgery coverage."      Patient Active Problem List   Diagnosis   . Total knee replacement status   . Low back pain   . Chronic obstructive pulmonary disease   . Hypertension     Past Medical History   Diagnosis Date   . Hypertension    . Pneumonia    . Chronic obstructive pulmonary disease    . Headache(784.0)      not since surgery in 1957   . Arthritis    . Difficulty in walking(719.7)    . Low back pain      Past Surgical History   Procedure Date   . Cervical spine surgery    . Lumbar spine surgery posterior w/ hardware   . Tonsillectomy    . Brain surgery R side/ 1957 Optic nerve pressure   . Eye surgery      cataracts   . Colonoscopy    . Tubal ligation 1961   . Arthroplasty, knee, total 08/25/2013     Procedure: ARTHROPLASTY, KNEE, TOTAL;  Surgeon: Lane Hacker, MD;  Location: [facility];  Service: Orthopedics;  Laterality: Right;   . Arthroplasty, knee, revision total 09/05/2013     Procedure: ARTHROPLASTY, KNEE, REVISION TOTAL;  Surgeon: Lane Hacker, MD;   Location: [facility];  Service: Orthopedics;  Laterality: Right;  RT KNEE WASHOUT & POLY EXCHANGE **POA/CH** (STRYKER)    . Arthroplasty, knee, resection 09/15/2013     Procedure: ARTHROPLASTY, KNEE, RESECTION;  Surgeon: Lane Hacker, MD;  Location: [facility];  Service: Orthopedics;  Laterality: Right;  AND PLACEMENT OF ANTIBIOTIC SPACER  Placement of wound vac   . Arthroplasty, knee, revision total 09/22/2013     Procedure: ARTHROPLASTY, KNEE, REVISION TOTAL;  Surgeon: Lane Hacker, MD;  Location: [facility];  Service: Orthopedics;  Laterality: Right;   . Debridement, muscle flap closure 09/22/2013     Procedure: DEBRIDEMENT, MUSCLE FLAP CLOSURE;  Surgeon: Ernie Avena, MD;  Location: [facility];  Service: Plastics;  Laterality: Right;  DEBRIDEMENT & MUSCLE FLAP KNEE RT   . Graft, skin  split thickness, (medical) 09/22/2013     Procedure: GRAFT, SKIN  SPLIT THICKNESS, (MEDICAL);  Surgeon: Ernie Avena, MD;  Location: [facility];  Service: Plastics;  Laterality: N/A;     Precautions:   Fall  (Per MD note on 12/18): Non weight bearing in Extension with brace on, No bending knee, may get OOB to chair. NWB RLE NO knee ROM  X-Rays/Tests/Labs:  Results for [name], [name] (MRN [id number]) as of 09/25/2013 13:32   Ref. Range 09/25/2013 06:19   WBC Latest Range: 3.50-10.80  12.73 (H)   Hemoglobin Latest Range: 12.0-16.0 g/dL 54.0 (L)   Hematocrit Latest Range: 37.0-47.0 % 30.5 (L)       Social History:  Lives alone (from [place]), planning to stay with family member here if/when discharged home.   Pt unclear about number of stairs family member has at home.   Equipment at home: Pt reports she will have no equipment available at her family members home, and all of her equipment is back at her house in [place].   Prior Level of Function:    Cognition: WFL    Mobility: Modified independent with use of SPC or walker   ADLs; independent/modified independent with all prior      Subjective: Patient is agreeable to participation in the therapy session. Nursing clears patient for therapy.  Patient's Goal: decrease pain, get more rehab  Pain: 5/10  Location: RLE  Therapist Intervention: RN aware-pain medication was given prior to session. OT changed handling/positioning.   Patient is satisfied with therapist intervention.    Objective:  Patient is in bed with Intravenous (IV), Sequential Compression Device (SCD), Dressings/bandages on R knee, and JP Drain (2) in place.    Observation of patient/vitals:   BP supine prior to activity: 173/72 with HR of 67  BP seated in chair post activity: 154/66 with HR of 66     Filed Vitals:    09/24/13 1609 09/24/13 1954 09/25/13 0432 09/25/13 0849   BP: 137/63 110/56 175/74 179/75   Pulse: 66 73 70 71   Temp: 98.4 F (36.9 C) 97.5 F (36.4 C) 98.2 F (36.8 C)    TempSrc:  Oral Oral    Resp: 18 18 18     Height:       Weight:       SpO2:  96% 98%        Orientation/Cognition:     Alert and Oriented x 4  Cognition: WFL. Anxious with activity.     Musculoskeletal Examination:          ROM Strength   RUE WFL 5/5   LUE WFL 5/5     Sensation: Intact to light touch, localization, proprioception throughout BUE  Coordination: WFL BUE  Vision: WFL  Hearing: WFL    Functional Mobility:  Rolling: NT    Scooting: Min A at EOB and increased time  Supine to sit: Mod A  Sit to Supine: NT  Sit to stand: NT  Stand to sit: NT  Transfers: Min A of 2 (1 person to assist pt and 1 person to manage RLE) to complete lateral scooting transfer from bed to wheelchair towards L side.     Balance:  Static Sit Balance: good  Dynamic Sit Balance: good  Static Stand Balance: NT  Dynamic Stand Balance: NT    Self Care:  Eating: independent  Grooming: supervision; seated in wheelchair  Bathing: Max A  UB Dressing: independent   LB Dressing: dependent  Toileting: NT    Endurance: fair    Participation:  good    Education:  Educated the patient to role of occupational therapy, plan of  care, goals  of therapy and HEP, safety with mobility and ADLs, weight bearing precautions, adaptive equipment and LE ADL techniques.    Assessment:  ZENDA HERSKOWITZ is a 77 y.o. female admitted 09/05/2013. Pt's ability  to complete ADLs and functional transfers is impaired due to the following deficits: Pain, activity tolerance, standing balance, NWB status to RLE, safety awareness.  Pt would continue to benefit from OT to address these deficits and increase independence with ADLs and functional transfers. Pt is very motivated to participate with therapy.     Rehabilitation Potential:   good     Patient is in wheelchair with equipment intact as found (RLE elevated and knee in full extension), and call bell within reach. RN notified of session outcome.  Mobility and ADL status posted at bedside.    Goals:  Goal Formulation: Patient  Time For Goal Achievement: 5 visits  Goals: Select goal    ADL Goals  Patient will groom self:  (standing at sink with CGA)  Patient will dress lower body: with minimal assist;with AE  Pt will complete bathing: With minimal assist (with AE)  Patient will toilet: with minimal assist    Mobility and Transfer Goals  Pt will transfer bed to [facility]:  (with CGA)      Executive Fucntion Goals  Other Goal: Pt will independenlty maintain NWB status 100% of the time during ADLs and functional mobility    OT Plan  Treatment Interventions: ADL retraining;Functional transfer training;UE strengthening/ROM;Endurance training;Patient/Family training;Equipment eval/education;Compensatory technique education;Continued evaluation  OT Frequency Recommended: 4-5x/wk    D/C Suggestions:  Discharge Recommendation: Acute Rehab  DME Recommended for Discharge:  (defer to next level of rehab)    Signature:   Louie Boston, OT  09/25/2013  1:30 PM  Phone: [phone]

## 2013-09-25 NOTE — Progress Notes (Signed)
PROGRESS NOTE    Date Time: 09/25/2013 6:47 AM  Patient Name: [name], [name]      Assessment:    Right TKA Irrigation and Debridement and Poly Exchange 09/05/13, 12/8 resection and antibiotic spacer, 12/15 antibiotic spacer exchange and plastic surgery coverage.   Plan:    PT - Non weight bearing in Extension with brace on, No bending knee, may get OOB to chair. NWB RLE NO knee ROM   DVT: ASA 325 bid   ID: Pan-Sensitive Proteus ssp Final Recommendations Cipro 750 bid for 6 weeks, on Ancef for now per ID   D/c planning pending- soft tissue healing   Appreciate plastics input re: gastroc flap      Subjective:   Pain controlled, tolerating diet, participating in PT/OT    Medications:     Current Facility-Administered Medications   Medication Dose Route Frequency   . amLODIPine  5 mg Oral Daily   . bisoprolol  5 mg Oral Daily   . ceFAZolin  2 g Intravenous Q8H SCH   . celecoxib  200 mg Oral Daily   . cloNIDine  0.1 mg Oral Daily   . docusate sodium  100 mg Oral Q12H SCH   . ferrous sulfate 324 mg-ascorbic acid 500 mg combo dose   Oral Daily   . [COMPLETED] furosemide  20 mg Intravenous Once   . gabapentin  300 mg Oral BID   . hydrochlorothiazide  25 mg Oral Daily   . oxyCODONE  10 mg Oral Q12H SCH   . polycarbophil  1,250 mg Oral Daily   . polyethylene glycol  17 g Oral Daily   . potassium chloride  10 mEq Oral Daily   . Senna  17.2 mg Oral QHS   . tiotropium  18 mcg Inhalation QAM   . traMADol  50 mg Oral Q6H SCH       Physical Exam:     Filed Vitals:    09/25/13 0432   BP: 175/74   Pulse: 70   Temp: 98.2 F (36.8 C)   Resp: 18   SpO2: 98%       Intake and Output Summary (Last 24 hours) at Date Time    Intake/Output Summary (Last 24 hours) at 09/25/13 5409  Last data filed at 09/25/13 0447   Gross per 24 hour   Intake      0 ml   Output   2115 ml   Net  -2115 ml       General appearance - alert and oriented, in no acute distress  Dressing c/d/i  Drains intact  Fires TA/EHL/GSC  SILT SP/DP/TN  2+ DP pulse  Calves soft,  nontender bilateral    Labs:     Recent CBC Recent Labs   Basename 09/25/13 0619    RBC 3.61*    HGB 10.1*    HCT 30.5*    LABPLAT --     Recent BMP No results found for this basename: GLU,BUN,CREAT,CA,NA,K,CL,CO2,AGAP in the last 24 hours  Recent PT/INR No results found for this basename: PTI,INR,COUM,COUMP in the last 24 hours      Signed by: Robb Matar

## 2013-09-25 NOTE — Plan of Care (Signed)
Problem: Health Promotion  Goal: Knowledge - health resources  Extent of understanding and conveyed about healthcare resources.   Intervention: Discharge planning  Met with pt and children.  Pt OK to get oob per plastic and ortho.  PT and OT evals complete  Referral to acute rehab per eval recommendations  Await bed availability  Pt and family agreeable

## 2013-09-25 NOTE — Op Note (Signed)
FULL OPERATIVE NOTE    Date Time: 09/25/2013 12:27 PM  Patient Name: [name]  Attending Physician: Lane Hacker, MD      Date of Operation:   09/22/2013    Providers Performing:   Surgeon(s):  Blaize Epple, Kelvin Cellar, MD  Ahmed Prima, PA    Azucena Fallen, RN - Circulator  Verne Carrow - Scrub Person  Donzetta Kohut - Second Scrub Person  Wright-Slekis, Lynnea Maizes, RN - Relief Circulator  Garrison Columbus E - Relief Scrub  Princess Perna, RN - Relief Circulator  [place], [place] - Relief Scrub    Operative Procedure:   Procedure(s):  Right medial gastrocnemius muscle flap.    Preoperative Diagnosis:   Pre-Op Diagnosis Codes:     * Infection and inflammatory reaction due to internal joint prosthesis, initial encounter [996.66]       Exposed antibiotic spacer  Postoperative Diagnosis:   s/p right knee cement spacer    Indications:   77yo AA female with history of right knee pain and infection requiring placement of antibiotic spacer by Dr. Ahmed Prima. Patient underwent exchange of antibiotic spacer today, and Dr. Ahmed Prima asked for assistance in coverage of the spacer and knee joint. Given patient's complicated history, Dr. Ahmed Prima felt that even if the overlying skin could be reapproximated, the knee joint and spacer would benefit from muscle coverage and isolation from the overlying tissues; the patient was in agreement with this plan, and the R/B/A were explained to the patient and documented on the consent form. All questions were answered, and patient wished to proceed with today's surgery.    Operative Notes:   Prior to initiation of the operative procedure a surgical time-out was taken and the patient's name, medical record, number, date-of-birth, and operative side was verified with the surgical consent form. Additionally it was verified that the appropriate peri-operative antibiotic administration was completed, and the correct operative instrumentations were available. Please refer to Dr. Earlean Shawl operative note  for details on their portion of the procedure. Plastic Surgery was called once the abx spacer had been exchanged. The wound was irrigated and packed with dry lap pads for hemostasis, and towel clamps were placed to keep the incision closed while the gastroc muscle was being harvested. The tourniquet was still activated, and continued for an additional time. A posteromedial right leg incision with a #10 blade was made over the right medial gastrocnemius muscle. The soft tissues were dissected with electrocautery and blunt dissection. Veins were ligated with 3-0 silk ties and hemoclips. The right medial gastroc muscle was identified and carefully dissected out. The lesser saphenous vein and sural nerve were identified at the median raphe of the two heads of the gastroc, and lateralized. The right medial gastroc was dissected free from the median raphe with electrocautery and distally a 1 cm cuff of the common tendon was dissected with the muscle. Blunt dissection was used to separate the right medial gastroc from the soleus. A tunnel was then created to communicate with the anterior wound. The tourniquet was released at this point. Hemostasis was achieved with electrocautery. The muscle was scored along the underbelly with #15 blade in a cross-hatch fashion and then rotated over the abx spacer. The muscle was secured in place with 0-PDS interrupted sutures. Complete coverage of the abx spacer was achieved. Prior to closure of both incisions, a JP round #19 drain was placed in each of the wound beds.The anterior wound was then closed in layers with buried interrupted 0-PDS for  deep fascial layer. Then 0-PDS vertical mattress sutures for skin and staples. The posteromedial incision was closed with 2-0 PDS interrupted sutures for deep dermal layer and staples for skin. Xeroform, 4x4" gauze, abd pads, kerlix were then applied. Patient was awakened and taken to PACU in stable condition.      Estimated Blood Loss:   100  mL    Implants:     Implant Name Type Inv. Item Serial No. Manufacturer Lot No. LRB No. Used Action   CEMENT BONE SNGL PALACOS 1X40 - [id number] Cement CEMENT BONE SNGL PALACOS 1X40   [employer] [id number] Right 3 Implanted       Drains:   Drains: Yes, Drain #1: Jackson-Pratt Round 19 Fr and Drain #2: Jackson-Pratt Round 19 Fr      Specimens:       Complications:   none      Signed by: Ernie Avena, MD

## 2013-09-25 NOTE — Progress Notes (Signed)
[facility] PROGRESS NOTE    Date Time: 09/25/2013 8:59 AM  Patient Name: [name]        Subjective:   "I'm doing better today" No complaints.   Daughter at bedside.     Medications:      Scheduled Meds: PRN Meds:           amLODIPine 5 mg Oral Daily   bisoprolol 5 mg Oral Daily   ceFAZolin 2 g Intravenous Q8H SCH   celecoxib 200 mg Oral Daily   cloNIDine 0.1 mg Oral Daily   docusate sodium 100 mg Oral Q12H SCH   ferrous sulfate 324 mg-ascorbic acid 500 mg combo dose  Oral Daily   [COMPLETED] furosemide 20 mg Intravenous Once   gabapentin 300 mg Oral BID   hydrochlorothiazide 25 mg Oral Daily   oxyCODONE 10 mg Oral Q12H SCH   polycarbophil 1,250 mg Oral Daily   polyethylene glycol 17 g Oral Daily   potassium chloride 10 mEq Oral Daily   Senna 17.2 mg Oral QHS   tiotropium 18 mcg Inhalation QAM   traMADol 50 mg Oral Q6H SCH         Continuous Infusions:         albuterol 2.5 mg Q4H PRN   bisacodyl 10 mg QD PRN   diphenhydrAMINE 25 mg Q6H PRN   fentaNYL 25 mcg Q5 Min PRN   HYDROcodone-acetaminophen 1 tablet Q4H PRN   HYDROcodone-acetaminophen 2 tablet Q6H PRN   HYDROcodone-acetaminophen 2 tablet Q4H PRN   HYDROmorphone 0.5 mg Q1H PRN   HYDROmorphone 0.5 mg Q5 Min PRN   hydrOXYzine 25 mg Q12H PRN   meperidine 25 mg Q2H PRN   metoclopramide 10 mg Once PRN   naloxone 0.1 mg PRN   ondansetron 4 mg Q8H PRN   ondansetron 4 mg Once PRN   ondansetron 4 mg Q8H PRN   oxyCODONE-acetaminophen 1 tablet Q4H PRN   senna-docusate 2 tablet BID PRN   zolpidem 5 mg QHS PRN         I personally reviewed all of the medications      Review of Systems:   A comprehensive review of systems was obtained from chart review and the patient  General ROS: negative  Ophthalmic ROS: negative  Endocrine ROS: negative  Respiratory ROS: no cough, denies shortness of breath  Cardiovascular ROS: no chest pain or some dyspnea on exertion  Gastrointestinal ROS: no abdominal pain, no N/V, better appetite.   Genito-Urinary ROS: no c/o  currently  Musculoskeletal ROS: right knee pain- managed with pain meds  Neurological ROS: no TIA or stroke symptoms, denies dizziness      Physical Exam:     Filed Vitals:    09/24/13 1609 09/24/13 1954 09/25/13 0432 09/25/13 0849   BP: 137/63 110/56 175/74 179/75   Pulse: 66 73 70 71   Temp: 98.4 F (36.9 C) 97.5 F (36.4 C) 98.2 F (36.8 C)    TempSrc:  Oral Oral    Resp: 18 18 18     Height:       Weight:       SpO2:  96% 98%          Intake and Output Summary (Last 24 hours) at Date Time    Intake/Output Summary (Last 24 hours) at 09/25/13 0859  Last data filed at 09/25/13 0447   Gross per 24 hour   Intake      0 ml   Output  2115 ml   Net  -2115 ml       General appearance - awake, well appearing, and in no distress, c/o pain- just received meds  Mental status - alert, oriented to person, place, and time  Chest -Scattered expiratory wheezes throughout, no rales or rhonchi, symmetric air entry  Heart - normal rate, regular rhythm, normal S1, S2, no murmurs  Abdomen - bowel sounds normal, abdomen is soft, nontender, non distended, obese  Neurological - alert, oriented, normal speech, no focal findings or movement disorder noted  Musculoskeletal -  Resting in bed.   Extremities - peripheral pulses normal, no pedal edema, no clubbing or cyanosis  RLE w/ brace and kling wrap dressing      Labs:       Lab 09/25/13 0619 09/24/13 0424 09/23/13 0833 09/22/13 0444 09/21/13 1028   GLU 105* 108* 89 -- --   BUN 9 9 13  -- --   CREAT 0.7 0.7 0.6 -- --   CA 8.4 8.4 8.1 -- --   NA 140 139 140 -- --   K 3.6 4.1 3.9 -- --   CL 103 103 103 -- --   CO2 28 29 25  -- --   ALB -- -- -- 2.6* --   PHOS -- -- -- -- --   MG -- -- -- -- --   EGFR >60.0 >60.0 >60.0 -- --   PT -- -- -- -- 14.2   PTT -- -- -- -- --   INR -- -- -- -- 1.1         Lab 09/22/13 0444   AST 18   ALT 7   ALKPHOS 93   ALB 2.6*   BILITOTAL 0.7   AMY --   LIP --         Lab 09/25/13 0619 09/24/13 0424 09/23/13 0833   WBC 12.73* 12.77* 11.63*   HGB 10.1* 7.5* 7.8*    HCT 30.5* 23.6* 24.4*   MCV 84.5 85.8 85.0   MCH 28.0 27.3* 27.2*   MCHC 33.1 31.8* 32.0   RDW 15 16* 16*   MPV 9.9 9.4 9.0*       No results found for this basename: TSH,FREET3,FREET4 in the last 168 hours        Rads:     Radiology Results (24 Hour)     ** No Results found for the last 24 hours. **                  Assessment and Plan:      Patient Active Problem List   Diagnosis   . S/p R TKA, readmitted 09/05/13. S/p R TKA I&D and Poly Exchange, s/p Revision. S/p gastrocnemius muscle flap and skin graft- continue plan per  Ortho, ID, and Plastic Surgery.     Poor wound healing/Increased nutritional need- on Boost Breeze and Juven for supplementation   . Low back pain - controlled   . Chronic obstructive pulmonary disease- Continue spriva, d/c duoneb. Continue albuterol nebs prn. Currently stable. Continue to encourage IS use. Currently using IS.    Marland Kitchen Hypertension-  BP elevated. BP medications just given. Continue current meds and follow   Post-operative Acute Anemia, s/p surgery. Continue Fe/Vit C. Pt s/p transfusions (2 units) 09/24/13. H/H stable. Currently asymptomatic.  OOB to chair non weight bearing with physical therapy.   Continue antibiotics per ID.     Post-operative pain- treat PRN per Ortho      Signed by: Santina Evans  Marin Roberts, NP

## 2013-09-25 NOTE — Progress Notes (Signed)
PROGRESS NOTE    Date Time: 09/25/2013 12:12 PM  Patient Name: [name], [name]      Assessment:   POD#3, s/p right medial gastocnemius muscle flap.    Plan:   Dressings changed. Continue JPs, dry dressing to anterior incision line daily.   OOB to chair today with right leg elevation. If tolerates that and pain is controlled, then consider ambulation with PT/OT if ok with Orthopedics and if the knee immobilizer can remain off.      Subjective:   Still in mild-moderate pain. Tolerating po.    Medications:     Current Facility-Administered Medications   Medication Dose Route Frequency   . amLODIPine  5 mg Oral Daily   . bisoprolol  5 mg Oral Daily   . ceFAZolin  2 g Intravenous Q8H SCH   . celecoxib  200 mg Oral Daily   . cloNIDine  0.1 mg Oral Daily   . docusate sodium  100 mg Oral Q12H SCH   . ferrous sulfate 324 mg-ascorbic acid 500 mg combo dose   Oral Daily   . [COMPLETED] furosemide  20 mg Intravenous Once   . gabapentin  300 mg Oral BID   . hydrochlorothiazide  25 mg Oral Daily   . oxyCODONE  10 mg Oral Q12H SCH   . polycarbophil  1,250 mg Oral Daily   . polyethylene glycol  17 g Oral Daily   . potassium chloride  10 mEq Oral Daily   . Senna  17.2 mg Oral QHS   . tiotropium  18 mcg Inhalation QAM   . traMADol  50 mg Oral Q6H SCH       Review of Systems:   A comprehensive review of systems was: Dermatological ROS: dressing in place over RLE    Physical Exam:     Filed Vitals:    09/25/13 0849   BP: 179/75   Pulse: 71   Temp:    Resp:    SpO2:        Intake and Output Summary (Last 24 hours) at Date Time    Intake/Output Summary (Last 24 hours) at 09/25/13 1212  Last data filed at 09/25/13 0447   Gross per 24 hour   Intake      0 ml   Output   2115 ml   Net  -2115 ml       Skin - RLE dressings removed: medial leg incision c/d/i; anterior incision intact, slight ecchymosis at distal aspect, no drainage; no erythema/fluctuance; JPs x 2 serosanguinous, output decreasing gradually     Labs:     Results     Procedure  Component Value Units Date/Time    Basic Metabolic Panel [[id number]]  (Abnormal) Collected:09/25/13 0619    Specimen Information:Blood Updated:09/25/13 0715     Glucose 105 (H) mg/dL      BUN 9 mg/dL      Creatinine 0.7 mg/dL      CALCIUM 8.4 mg/dL      Sodium 782 mEq/L      Potassium 3.6 mEq/L      Chloride 103 mEq/L      CO2 28 mEq/L     GFR [[id number]] Collected:09/25/13 0619     EGFR >60.0 Updated:09/25/13 0715    CBC without differential [[id number]]  (Abnormal) Collected:09/25/13 0619    Specimen Information:Blood / Blood Updated:09/25/13 0643     WBC 12.73 (H)      RBC 3.61 (L)      Hgb 10.1 (L) g/dL  Hematocrit 30.5 (L) %      MCV 84.5 fL      MCH 28.0 pg      MCHC 33.1 g/dL      RDW 15 %      Platelets 288      MPV 9.9 fL      Nucleated RBC 0     Prepare RBC: two units [[id number]] Collected:09/24/13 1601     RBC Leukoreduced [id number]    issued Updated:09/25/13 0157     RBC Leukoreduced [id number]    transfused     Narrative:    ?Number of units->2  ?Special Requirements->No special requirements  ?Transfusion Criteria->Other: please specify in comments  ?Is the patient pregnant?->No  ?Has the patient been transfused w/i the last 3 months?->Yes    Type and Screen: if there is no current type and screen in blood bank [id number] Collected:09/24/13 1601    Specimen Information:Blood Updated:09/24/13 1712     ABO Rh B POS      AB Screen Gel NEG     Narrative:    ?Number of units->2  ?Special Requirements->No special requirements  ?Transfusion Criteria->Other: please specify in comments  ?Is the patient pregnant?->No  ?Has the patient been transfused w/i the last 3 months?->Yes          Signed by: Ernie Avena

## 2013-09-25 NOTE — Progress Notes (Signed)
Pt is alert and oriented x 3.  2 unit of blood transfusion given, no reaction noted.  Lasix 20 mg IV given after the first unit of blood transfusion.  Foley kept until blood transfusion completed for accurate intake and outout.  Pt BP at the end of transfusion  was 175/74, 70, 18, 98.2, 98%@r /a.  Pt denies chest pain of dizziness.  Dr. Cyndie Chime called and notified, no new order given at this time.  Pt is continue on IV antibiotic.  Some old drainage noted under right leg, pad placed underneath right leg per order. Call bell within reach.  Will continue on plan of care.

## 2013-09-26 MED ORDER — AMLODIPINE BESYLATE 5 MG PO TABS
5.0000 mg | ORAL_TABLET | Freq: Once | ORAL | Status: AC
Start: 2013-09-26 — End: 2013-09-26
  Administered 2013-09-26: 5 mg via ORAL
  Filled 2013-09-26: qty 1

## 2013-09-26 MED ORDER — AMLODIPINE BESYLATE 5 MG PO TABS
10.0000 mg | ORAL_TABLET | Freq: Every day | ORAL | Status: DC
Start: 2013-09-27 — End: 2013-10-03
  Administered 2013-09-27 – 2013-10-03 (×7): 10 mg via ORAL
  Filled 2013-09-26 (×7): qty 2

## 2013-09-26 NOTE — Progress Notes (Signed)
[facility] PROGRESS NOTE    Date Time: 09/26/2013 8:51 AM  Patient Name: [name]        Subjective:   "I'm doing good"   Daughter at bedside.     Medications:      Scheduled Meds: PRN Meds:           amLODIPine 5 mg Oral Daily   bisoprolol 5 mg Oral Daily   ceFAZolin 2 g Intravenous Q8H SCH   celecoxib 200 mg Oral Daily   cloNIDine 0.1 mg Oral Daily   docusate sodium 100 mg Oral Q12H SCH   ferrous sulfate 324 mg-ascorbic acid 500 mg combo dose  Oral Daily   gabapentin 300 mg Oral BID   hydrochlorothiazide 25 mg Oral Daily   oxyCODONE 10 mg Oral Q12H SCH   polycarbophil 1,250 mg Oral Daily   polyethylene glycol 17 g Oral Daily   potassium chloride 10 mEq Oral Daily   Senna 17.2 mg Oral QHS   tiotropium 18 mcg Inhalation QAM   traMADol 50 mg Oral Q6H SCH         Continuous Infusions:         albuterol 2.5 mg Q4H PRN   bisacodyl 10 mg QD PRN   diphenhydrAMINE 25 mg Q6H PRN   fentaNYL 25 mcg Q5 Min PRN   HYDROcodone-acetaminophen 1 tablet Q4H PRN   HYDROcodone-acetaminophen 2 tablet Q6H PRN   HYDROcodone-acetaminophen 2 tablet Q4H PRN   HYDROmorphone 0.5 mg Q1H PRN   HYDROmorphone 0.5 mg Q5 Min PRN   hydrOXYzine 25 mg Q12H PRN   meperidine 25 mg Q2H PRN   metoclopramide 10 mg Once PRN   naloxone 0.1 mg PRN   ondansetron 4 mg Q8H PRN   ondansetron 4 mg Once PRN   ondansetron 4 mg Q8H PRN   oxyCODONE-acetaminophen 1 tablet Q4H PRN   senna-docusate 2 tablet BID PRN   zolpidem 5 mg QHS PRN         I personally reviewed all of the medications      Review of Systems:   A comprehensive review of systems was obtained from chart review and the patient  General ROS: negative  Ophthalmic ROS: negative  Endocrine ROS: negative  Respiratory ROS: no cough, denies shortness of breath  Cardiovascular ROS: no chest pain or some dyspnea on exertion  Gastrointestinal ROS: no abdominal pain. +nausea after breakfast. Denies vomiting . +BM yesterday per patient.   Genito-Urinary ROS: no c/o currently  Musculoskeletal  ROS: right knee pain- managed with pain meds  Neurological ROS: no TIA or stroke symptoms, denies dizziness      Physical Exam:     Filed Vitals:    09/25/13 0849 09/25/13 1500 09/25/13 1941 09/26/13 0456   BP: 179/75 160/70 168/72 151/66   Pulse: 71 70 71 64   Temp:  97.6 F (36.4 C) 98.1 F (36.7 C) 97.3 F (36.3 C)   TempSrc:   Oral Oral   Resp:  18 18 18    Height:       Weight:       SpO2:  98% 98% 97%         Intake and Output Summary (Last 24 hours) at Date Time    Intake/Output Summary (Last 24 hours) at 09/26/13 0851  Last data filed at 09/26/13 0536   Gross per 24 hour   Intake      0 ml   Output    110 ml  Net   -110 ml       General appearance - awake, well appearing, and in no distress, c/o pain- just received meds  Mental status - alert, oriented to person, place, and time  Chest -Lungs clear, no rales or rhonchi, symmetric air entry  Heart - normal rate, regular rhythm, normal S1, S2, no murmurs  Abdomen - bowel sounds normal, abdomen is soft, nontender, non distended, obese  Neurological - alert, oriented, normal speech, no focal findings or movement disorder noted  Musculoskeletal -  OOB to chair.   Extremities - peripheral pulses normal, no pedal edema, no clubbing or cyanosis  RLE w/ kling wrap dressing      Labs:       Lab 09/25/13 0619 09/24/13 0424 09/23/13 0833 09/22/13 0444 09/21/13 1028   GLU 105* 108* 89 -- --   BUN 9 9 13  -- --   CREAT 0.7 0.7 0.6 -- --   CA 8.4 8.4 8.1 -- --   NA 140 139 140 -- --   K 3.6 4.1 3.9 -- --   CL 103 103 103 -- --   CO2 28 29 25  -- --   ALB -- -- -- 2.6* --   PHOS -- -- -- -- --   MG -- -- -- -- --   EGFR >60.0 >60.0 >60.0 -- --   PT -- -- -- -- 14.2   PTT -- -- -- -- --   INR -- -- -- -- 1.1         Lab 09/22/13 0444   AST 18   ALT 7   ALKPHOS 93   ALB 2.6*   BILITOTAL 0.7   AMY --   LIP --         Lab 09/25/13 0619 09/24/13 0424 09/23/13 0833   WBC 12.73* 12.77* 11.63*   HGB 10.1* 7.5* 7.8*   HCT 30.5* 23.6* 24.4*   MCV 84.5 85.8 85.0   MCH 28.0 27.3*  27.2*   MCHC 33.1 31.8* 32.0   RDW 15 16* 16*   MPV 9.9 9.4 9.0*       No results found for this basename: TSH,FREET3,FREET4 in the last 168 hours        Rads:     Radiology Results (24 Hour)     ** No Results found for the last 24 hours. **                  Assessment and Plan:      Patient Active Problem List   Diagnosis   . S/p R TKA, readmitted 09/05/13. S/p R TKA I&D and Poly Exchange, s/p Revision. S/p gastrocnemius muscle flap and skin graft- continue plan per  Ortho, ID, and Plastic Surgery.     Poor wound healing/Increased nutritional need- on Boost Breeze and Juven for supplementation   . Low back pain - controlled   . Chronic obstructive pulmonary disease- Continue spriva, d/c duoneb. Continue albuterol nebs prn. Currently stable. Continue to encourage IS use.    Marland Kitchen Hypertension- Continues to be hypertensive. Will increase norvasc and follow.    Post-operative Acute Anemia, s/p surgery. Continue Fe/Vit C. Pt s/p transfusions (2 units) 09/24/13. H/H stable. Currently asymptomatic.  Nausea: PRN anti-nausea medication.   OOB to chair non weight bearing with physical therapy.   Continue antibiotics per ID.       Post-operative pain- treat PRN per Ortho      Signed by: Alinda Dooms, NP

## 2013-09-26 NOTE — Progress Notes (Signed)
Nutrition Follow-up    Assessment:    Active Hospital Problems    Diagnosis   . Total knee replacement status       Orders Placed This Encounter   Procedures   . Diet regular   . Boost Breeze Frequency:: Daily after lunch; Quantity:: A. One   . Juven Frequency:: BID with meals; Quantity:: A. One   . Miscellaneous Nourishment Frequency:: Once; Quantity:: A. One; Flavor:: Vanilla   50% po intake and drinking supplement sometimes.    Current Meds:       amLODIPine 5 mg Daily   bisoprolol 5 mg Daily   ceFAZolin 2 g [facility]   celecoxib 200 mg Daily   cloNIDine 0.1 mg Daily   docusate sodium 100 mg Q12H SCH   ferrous sulfate 324 mg-ascorbic acid 500 mg combo dose  Daily   gabapentin 300 mg BID   hydrochlorothiazide 25 mg Daily   oxyCODONE 10 mg Q12H [facility]   polycarbophil 1,250 mg Daily   polyethylene glycol 17 g Daily   potassium chloride 10 mEq Daily   Senna 17.2 mg QHS   tiotropium 18 mcg QAM   traMADol 50 mg Q6H [facility]          Recent Labs:    Lab 09/25/13 0619 09/24/13 0424 09/23/13 0833   WBC 12.73* 12.77* 11.63*   HGB 10.1* 7.5* 7.8*   HCT 30.5* 23.6* 24.4*   MCV 84.5 85.8 85.0   PLT 288 331 331       Lab 09/25/13 0619 09/24/13 0424 09/23/13 0833 09/22/13 0444 09/21/13 1028   NA 140 139 140 140 140   K 3.6 4.1 3.9 4.0 3.8   CL 103 103 103 103 102   CO2 28 29 25 25 28    BUN 9 9 13 19 10    CREAT 0.7 0.7 0.6 0.8 0.7   GLU 105* 108* 89 102* 101*   CA 8.4 8.4 8.1 8.5 8.6   MG -- -- -- -- --   PHOS -- -- -- -- --   EGFR >60.0 >60.0 >60.0 >60.0 >60.0       Lab 09/22/13 0444   ALB 2.6*         Intake/Output Summary (Last 24 hours) at 09/26/13 1055  Last data filed at 09/26/13 0536   Gross per 24 hour   Intake      0 ml   Output    110 ml   Net   -110 ml       Anthropometrics  Height: 160 cm (5\' 3" )  Weight: 112 kg (246 lb 14.6 oz)  Weight Change: 0   IBW/kg (Calculated) Female: 56.37 kg  IBW/kg (Calculated) Female: 52.27 kg  BMI (calculated): 43.8     Estimated Nutrition Needs:  Estimated Energy Needs  Total Energy Estimated  Needs: 2240 -3025  Method for Estimating Needs: 22-27 kcal / kg     Estimated Protein Needs  Total Protein Estimated Needs: 112-134  Method for Estimating Needs: 1-1.2 g / kg          Fluid Needs  Total Fluid Estimated Needs: 2800   Method for Estimating Needs: 25 mL / kg     Learning and Discharge Planning Needs: None    Nutrition Diagnosis:   No new nutrition diagnosis    Intervention:  Continue nutrition plan    Goals:  Pt maintains 50% or more po intake for adequate nutrition    M/E:  Monitor: po intake, wound healing  and labs  Follow 10/06/13    Lurline Hare MS. [address]  09/26/13 @ 1105

## 2013-09-26 NOTE — Progress Notes (Signed)
PROGRESS NOTE    Date Time: 09/26/2013 6:37 AM  Patient Name: [name], [name]      Assessment:    Right TKA Irrigation and Debridement and Poly Exchange 09/05/13, 12/8 resection and antibiotic spacer, 12/15 antibiotic spacer exchange and plastic surgery coverage.   Plan:    PT - Non weight bearing in Extension with brace on, No bending knee, may get OOB to chair. NWB RLE NO knee ROM   DVT: ASA 325 bid   ID: Pan-Sensitive Proteus ssp Final Recommendations Cipro 750 bid for 6 weeks, on Ancef for now per ID   Drains in place - will D/C when plastic surgery recommends  D/c planning pending- soft tissue healing   Appreciate plastics input re: gastroc flap      Subjective:   Pain controlled, tolerating diet, participating in PT/OT    Medications:     Current Facility-Administered Medications   Medication Dose Route Frequency   . amLODIPine  5 mg Oral Daily   . bisoprolol  5 mg Oral Daily   . ceFAZolin  2 g Intravenous Q8H SCH   . celecoxib  200 mg Oral Daily   . cloNIDine  0.1 mg Oral Daily   . docusate sodium  100 mg Oral Q12H SCH   . ferrous sulfate 324 mg-ascorbic acid 500 mg combo dose   Oral Daily   . gabapentin  300 mg Oral BID   . hydrochlorothiazide  25 mg Oral Daily   . oxyCODONE  10 mg Oral Q12H SCH   . polycarbophil  1,250 mg Oral Daily   . polyethylene glycol  17 g Oral Daily   . potassium chloride  10 mEq Oral Daily   . Senna  17.2 mg Oral QHS   . tiotropium  18 mcg Inhalation QAM   . traMADol  50 mg Oral Q6H SCH       Physical Exam:     Filed Vitals:    09/26/13 0456   BP: 151/66   Pulse: 64   Temp: 97.3 F (36.3 C)   Resp: 18   SpO2: 97%       Intake and Output Summary (Last 24 hours) at Date Time    Intake/Output Summary (Last 24 hours) at 09/26/13 5409  Last data filed at 09/26/13 0536   Gross per 24 hour   Intake      0 ml   Output    110 ml   Net   -110 ml       General appearance - alert and oriented, in no acute distress  Dressing c/d/i  Drains intact  Fires TA/EHL/GSC  SILT SP/DP/TN  2+ DP  pulse  Calves soft, nontender bilateral    Labs:     Recent CBC No results found for this basename: WHITEBLOODCE,RBC,HGB,HCT,,LABPLAT, NRBCA, REFLX, ANRBA in the last 24 hours  Recent BMP No results found for this basename: GLU,BUN,CREAT,CA,NA,K,CL,CO2,AGAP in the last 24 hours  Recent PT/INR No results found for this basename: PTI,INR,COUM,COUMP in the last 24 hours      Signed by: Garey Ham

## 2013-09-26 NOTE — OT Progress Note (Signed)
[facility]  [address]  [place] [address]  [phone]    Occupational Therapy Treatment    Patient: [name]    MRN#: [id number]  Unit: [place] [facility] 4C         Bed: O536/U440.34    Medical Diagnosis: Infection of total knee replacement, initial encounter [996.66, V43.65] (INFECTED RT KNEE)  Total knee replacement status [V43.65] (Total knee replacement status)    Time of treatment:    Time Calculation  OT Received On: 09/26/13  Start Time: 0845  Stop Time: 0910  Time Calculation (min): 25 min    Treatment #  OT Visit Number: 1/5    Precautions and Contraindications:  Fall   RLE knee in full ext  NWB RLE     Subjective: "I'm doing better today"   Patient's medical condition is appropriate for Occupational Therapy  intervention at this time.  Patient is agreeable to participation in the therapy session. Family and/or guardian are agreeable to patient's participation in the therapy session. Nursing clears patient for therapy.  Pain: 4/10  Location: RLE  Therapist Intervention: RN aware. OT assisted with positioning and handling techniques.  Patient is satisfied with therapist intervention.    Objective:  Patient is in bed with Intravenous Access, Sequential Compression Device (SCD), Dressing and JP Drain (2)  in place.     Cognition:  WFL     Self Care:  Eating: NT  Grooming: supervision; seated in wheelchair   Bathing: Max A (without use of equipment)   UB Dressing: independent   LB Dressing: dependent (without use of equipment)   Toileting: NT    Functional Mobility:  Rolling: NT  Scooting: supervision and increased time at EOB  Supine to Sit: Mod A and increased time  Sit to Supine: NT  Sit to Stand: NT  Transfers: Min A of 2 (1 person to assist pt and 1 person to manage RLE) to complete lateral scooting transfer from bed to wheelchair towards L side.     Treatment Activities: ADLs, functional mobility     Observation of Patient:  Vitals:     Filed Vitals:     09/26/13 0456 09/26/13 0900 09/26/13 1108 09/26/13 1333   BP: 151/66 190/81 150/67 146/93   Pulse: 64 66 65 74   Temp: 97.3 F (36.3 C)   97.9 F (36.6 C)   TempSrc: Oral   Oral   Resp: 18 18 18 19    Height:       Weight:       SpO2: 97%  96% 97%       Education:   Educated the patient to role of occupational therapy, plan of care, goals  of therapy and HEP, safety with mobility and ADLs, weight bearing precautions, adaptive equipment and LE ADL techniques.    Patient left in wheelchair with all needs met, equipment intact and call bell within reach. RN notified of session outcome. Updated status posted at bedside.     Assessment:  Pt's ability to complete ADLs and functional transfers continues to be impaired due to the following deficits: Pain, activity tolerance, standing balance, NWB status to RLE, safety awareness. Pt would continue to benefit from OT to address these deficits and increase independence with ADLs and functional transfers. Pt is very motivated to participate with therapy.     Goals:  Time For Goal Achievement: 5 visits  ADL Goals  Patient will groom self:  (standing at sink  with CGA)  Patient will dress lower body: with minimal assist;with AE  Pt will complete bathing: With minimal assist (with AE)  Patient will toilet: with minimal assist  Mobility and Transfer Goals  Pt will transfer bed to [facility]:  (with CGA)    Executive Fucntion Goals  Other Goal: Pt will independenlty maintain NWB status 100% of the time during ADLs and functional mobility    OT Plan  Treatment Interventions: ADL retraining;Functional transfer training;UE strengthening/ROM;Endurance training;Patient/Family training;Equipment eval/education;Compensatory technique education;Continued evaluation  Discharge Recommendation: Acute Rehab  DME Recommended for Discharge:  (defer to next level of rehab)  OT Frequency Recommended: 4-5x/wk    Continue plan of care.    Signature:   Louie Boston, OT  09/26/2013 3:47 PM   Phone:  [phone]

## 2013-09-27 NOTE — Progress Notes (Signed)
[facility] PROGRESS NOTE    Date Time: 09/27/2013 9:04 AM  Patient Name: Paro,Tanya L        Subjective:   "I'm doing good"       Medications:      Scheduled Meds: PRN Meds:           amLODIPine 10 mg Oral Daily   [COMPLETED] amLODIPine 5 mg Oral Once   bisoprolol 5 mg Oral Daily   ceFAZolin 2 g Intravenous Q8H SCH   celecoxib 200 mg Oral Daily   cloNIDine 0.1 mg Oral Daily   docusate sodium 100 mg Oral Q12H SCH   ferrous sulfate 324 mg-ascorbic acid 500 mg combo dose  Oral Daily   gabapentin 300 mg Oral BID   hydrochlorothiazide 25 mg Oral Daily   oxyCODONE 10 mg Oral Q12H SCH   polycarbophil 1,250 mg Oral Daily   polyethylene glycol 17 g Oral Daily   potassium chloride 10 mEq Oral Daily   Senna 17.2 mg Oral QHS   tiotropium 18 mcg Inhalation QAM   traMADol 50 mg Oral Q6H SCH   [DISCONTINUED] amLODIPine 5 mg Oral Daily         Continuous Infusions:         albuterol 2.5 mg Q4H PRN   bisacodyl 10 mg QD PRN   diphenhydrAMINE 25 mg Q6H PRN   fentaNYL 25 mcg Q5 Min PRN   HYDROcodone-acetaminophen 1 tablet Q4H PRN   HYDROcodone-acetaminophen 2 tablet Q6H PRN   HYDROcodone-acetaminophen 2 tablet Q4H PRN   HYDROmorphone 0.5 mg Q1H PRN   HYDROmorphone 0.5 mg Q5 Min PRN   hydrOXYzine 25 mg Q12H PRN   meperidine 25 mg Q2H PRN   metoclopramide 10 mg Once PRN   naloxone 0.1 mg PRN   ondansetron 4 mg Q8H PRN   ondansetron 4 mg Once PRN   ondansetron 4 mg Q8H PRN   oxyCODONE-acetaminophen 1 tablet Q4H PRN   senna-docusate 2 tablet BID PRN   zolpidem 5 mg QHS PRN         I personally reviewed all of the medications      Review of Systems:   A comprehensive review of systems was obtained from chart review and the patient  General ROS: negative  Ophthalmic ROS: negative  Endocrine ROS: negative  Respiratory ROS: no cough, denies shortness of breath  Cardiovascular ROS: no chest pain or some dyspnea on exertion  Gastrointestinal ROS: no abdominal pain. +nausea after breakfast. Denies vomiting .   Genito-Urinary  ROS: no c/o currently  Musculoskeletal ROS: right knee pain- managed with pain meds  Neurological ROS: no TIA or stroke symptoms, denies dizziness      Physical Exam:     Filed Vitals:    09/26/13 1333 09/26/13 2125 09/27/13 0603 09/27/13 0854   BP: 146/93 158/70 153/66 154/67   Pulse: 74 67 64 65   Temp: 97.9 F (36.6 C) 98.2 F (36.8 C) 97.5 F (36.4 C) 97.3 F (36.3 C)   TempSrc: Oral      Resp: 19 20 18 19    Height:       Weight:       SpO2: 97% 98% 94% 96%         Intake and Output Summary (Last 24 hours) at Date Time    Intake/Output Summary (Last 24 hours) at 09/27/13 0904  Last data filed at 09/26/13 1900   Gross per 24 hour   Intake  0 ml   Output     40 ml   Net    -40 ml       General appearance - awake, well appearing, and in no distress, c/o pain- just received meds  Mental status - alert, oriented to person, place, and time  Chest -Lungs clear, no rales or rhonchi, symmetric air entry  Heart - normal rate, regular rhythm, normal S1, S2, no murmurs  Abdomen - bowel sounds normal, abdomen is soft, nontender, non distended, obese  Neurological - alert, oriented, normal speech, no focal findings or movement disorder noted  Musculoskeletal -  Laying in bed.   Extremities - peripheral pulses normal, no pedal edema, no clubbing or cyanosis  RLE w/ kling wrap dressing      Labs:       Lab 09/25/13 0619 09/24/13 0424 09/23/13 0833 09/22/13 0444 09/21/13 1028   GLU 105* 108* 89 -- --   BUN 9 9 13  -- --   CREAT 0.7 0.7 0.6 -- --   CA 8.4 8.4 8.1 -- --   NA 140 139 140 -- --   K 3.6 4.1 3.9 -- --   CL 103 103 103 -- --   CO2 28 29 25  -- --   ALB -- -- -- 2.6* --   PHOS -- -- -- -- --   MG -- -- -- -- --   EGFR >60.0 >60.0 >60.0 -- --   PT -- -- -- -- 14.2   PTT -- -- -- -- --   INR -- -- -- -- 1.1         Lab 09/22/13 0444   AST 18   ALT 7   ALKPHOS 93   ALB 2.6*   BILITOTAL 0.7   AMY --   LIP --         Lab 09/25/13 0619 09/24/13 0424 09/23/13 0833   WBC 12.73* 12.77* 11.63*   HGB 10.1* 7.5* 7.8*   HCT  30.5* 23.6* 24.4*   MCV 84.5 85.8 85.0   MCH 28.0 27.3* 27.2*   MCHC 33.1 31.8* 32.0   RDW 15 16* 16*   MPV 9.9 9.4 9.0*       No results found for this basename: TSH,FREET3,FREET4 in the last 168 hours        Rads:     Radiology Results (24 Hour)     ** No Results found for the last 24 hours. **                  Assessment and Plan:      Patient Active Problem List   Diagnosis   . S/p R TKA, readmitted 09/05/13. S/p R TKA I&D and Poly Exchange, s/p Revision. S/p gastrocnemius muscle flap and skin graft- continue plan per  Ortho, ID, and Plastic Surgery.     Poor wound healing/Increased nutritional need- on Boost Breeze and Juven for supplementation   . Low back pain - controlled   . Chronic obstructive pulmonary disease- Continue spriva, d/c duoneb. Continue albuterol nebs prn. Currently stable. Continue to encourage IS use.    Marland Kitchen Hypertension- Norvasc increased yesterday. Will monitor.    Post-operative Acute Anemia, s/p surgery. Continue Fe/Vit C. Pt s/p transfusions (2 units) 09/24/13. Currently asymptomatic.  Nausea: PRN anti-nausea medication. Tolerating diet.   OOB to chair non weight bearing with physical therapy.   Continue antibiotics per ID.       Post-operative pain- treat PRN per Ortho  Signed by: Alinda Dooms, NP

## 2013-09-27 NOTE — PT Progress Note (Addendum)
Physical Therapy Treatment    Patient:  [name]        MRN#:  [id number]  Unit:  [facility]        Room/Bed:  M442/M442.01    Time of treatment: Start Time: 1030 Stop Time: 1110  Time Calculation (min): 40 min  PT Received On: 09/27/13    Treatment #: PT Visit Number: 2/10    Precautions  Weight Bearing Status: RLE non weight bearing (per order dated 12/18)     Do not apply knee immobilizer (per order dated 12/19)    Medical Diagnosis:   Infection of total knee replacement, initial encounter [996.66, V43.65] (INFECTED RT KNEE)  Total knee replacement status [V43.65] (Total knee replacement status)  Surgical Procedure:Right  Procedure(s):  ARTHROPLASTY, KNEE, REVISION TOTAL  DEBRIDEMENT, MUSCLE FLAP CLOSURE  GRAFT, SKIN  SPLIT THICKNESS, (MEDICAL) performed by K. Campbell Lerner, MD on 09/22/2013.      Patient's medical condition is appropriate for Physical Therapy  intervention at this time.  Patient cleared by nurse to participate in PT session.    Subjective: I don't wear that brace anymore (per order dated 12/19)  Patient is agreeable to participation in the therapy session. Family and/or guardian are agreeable to patient's participation in the therapy session.                Objective:  Patient is in bed seen 5 Days Post-Op with IV, dressings and hemovac.                                       Sensation is intact              Functional Mobility  Supine to Sit: Modified independent (Device) (HOB elevated to upright)  Scooting to EOB: Min assist to left (primarily supporting RLE, required max effort, increased time)  Sit to Stand: Unable to assess (Comment) (not performed, pt stated she is not standing )  Stand to Sit: Unable to assess (Comment)    Transfers  Bed to Chair: Min assist to left (supporting RLE) with RLE elevated per MD order.    Locomotion  Ambulation: Not performed (NWB RLE)             Self Care and Home Management:  Pt did not perform self care activities at this  time.    Educated the patient to role of physical therapy, plan of care, goals  of therapy and safety with mobility and ADLs, weight bearing precautions.    Patient is seated in wheelchair with daughter present, all needs provided and call bell within reach. RN notified of session outcome    Assessment:   Assessment  Assessment: Decreased LE strength;Decreased LE ROM;Decreased UE strength;Decreased endurance/activity tolerance;Decreased functional mobility  Prognosis: Good;With continued PT status post acute discharge.     Lateral transfer required pt's max effort, multiple rests, SOB noted (pt has COPD).  Pt required assistance to support RLE to maintain extension.         Goals per Eval/Re-eval:     Goals  Goal Formulation: With patient;With patient/family  Time for Goal Acheivement: 7 visits  Goals: Select goal  Pt Will Go Supine To Sit: With minimal assist;Goal met  Pt Will Perform Sit to Stand: With minimal assist  Pt Will Transfer to Toilet: With minimal assist  Pt Will Transfer to Tub/Shower: With minimal assist  PT Will Advertising account executive Transfer Technique: With minimal assist  Pt Will Ambulate: 1-10 feet;with rolling walker;With minimal assist  Pt Will Go Up / Down Stairs: 3-5 stairs;With minimal assist  Pt Will Perform Home Exer Program: With minimal assist  Pt Will Perform LE Dressing w/Device: With min assist  Other Goal: Pt 10 % WB RLE with activity and with brace locked in extension    Plan:  Recommendation  Discharge Recommendation: Acute Rehab;SNF  DME Recommended for Discharge:  (None-has equipment at home)  PT - Next Visit Recommendation: 09/28/13  PT Frequency: twice a day    Continue plan of care.  Please note that updated orders have been written since the pt's last eval.  Most current orders state NWB RLE (dated 12/18) and "do not apply knee immobilizer" (dated 12/19), and keep leg elevated.      Signature: Madelaine Bhat, PTA 09/27/2013 11:21 AM

## 2013-09-27 NOTE — Progress Notes (Signed)
PROGRESS NOTE    Date Time: 09/27/2013 7:52 AM  Patient Name: Sheila, [name]      Assessment:    Right TKA Irrigation and Debridement and Poly Exchange 09/05/13, 12/8 resection and antibiotic spacer, 12/15 antibiotic spacer exchange and plastic surgery coverage.   Plan:    PT - Non weight bearing in Extension with brace on, No bending knee, may get OOB to chair. NWB RLE NO knee ROM   DVT: ASA 325 bid   ID: Pan-Sensitive Proteus ssp Final Recommendations Cipro 750 bid for 6 weeks, on Ancef for now per ID   Drains in place - will D/C when plastic surgery recommends  D/c planning pending- soft tissue healing   Appreciate plastics input re: gastroc flap      Subjective:   Pain controlled, tolerating diet, participating in PT/OT    Medications:     Current Facility-Administered Medications   Medication Dose Route Frequency   . amLODIPine  10 mg Oral Daily   . [COMPLETED] amLODIPine  5 mg Oral Once   . bisoprolol  5 mg Oral Daily   . ceFAZolin  2 g Intravenous Q8H SCH   . celecoxib  200 mg Oral Daily   . cloNIDine  0.1 mg Oral Daily   . docusate sodium  100 mg Oral Q12H SCH   . ferrous sulfate 324 mg-ascorbic acid 500 mg combo dose   Oral Daily   . gabapentin  300 mg Oral BID   . hydrochlorothiazide  25 mg Oral Daily   . oxyCODONE  10 mg Oral Q12H SCH   . polycarbophil  1,250 mg Oral Daily   . polyethylene glycol  17 g Oral Daily   . potassium chloride  10 mEq Oral Daily   . Senna  17.2 mg Oral QHS   . tiotropium  18 mcg Inhalation QAM   . traMADol  50 mg Oral Q6H SCH   . [DISCONTINUED] amLODIPine  5 mg Oral Daily       Physical Exam:     Filed Vitals:    09/27/13 0603   BP: 153/66   Pulse: 64   Temp: 97.5 F (36.4 C)   Resp: 18   SpO2: 94%       Intake and Output Summary (Last 24 hours) at Date Time    Intake/Output Summary (Last 24 hours) at 09/27/13 1610  Last data filed at 09/26/13 1900   Gross per 24 hour   Intake      0 ml   Output     40 ml   Net    -40 ml       General appearance - alert and oriented, in no acute  distress  Dressing c/d/i  Drains intact  Fires TA/EHL/GSC  SILT SP/DP/TN  2+ DP pulse  Calves soft, nontender bilateral    Labs:     Recent CBC No results found for this basename: WHITEBLOODCE,RBC,HGB,HCT,,LABPLAT, NRBCA, REFLX, ANRBA in the last 24 hours  Recent BMP No results found for this basename: GLU,BUN,CREAT,CA,NA,K,CL,CO2,AGAP in the last 24 hours  Recent PT/INR No results found for this basename: PTI,INR,COUM,COUMP in the last 24 hours      Signed by: Cheryle Horsfall

## 2013-09-27 NOTE — PT Progress Note (Signed)
PT attempted, however, pt declined PT services, stating she has already been OOB today.

## 2013-09-27 NOTE — Progress Notes (Signed)
Above reviewed. No new ID problems noted. Will get baseline labs on Mon a.m. Plan is to change to PO Cipro when she is ready for D/C home or on Monday, either way, unless returning to OR.  Dr. Janalyn Rouse covering for questions.

## 2013-09-28 NOTE — PT Progress Note (Signed)
Physical Therapy Note    Physical Therapy Treatment    Patient:  [name]        MRN#:  [id number]  Unit:  [place] [facility]        Room/Bed:  M442/M442.01    Time of treatment: Start Time: 1135 Stop Time: 1205  Time Calculation (min): 30 min  PT Received On: 09/28/13    Treatment #: PT Visit Number: 3/10    Precautions  Weight Bearing Status: RLE non weight bearing  Weight Bearing Percent: 0   Total Knee Replacement: other (comment);at all times (0 flexion of RLE)  Precaution Instructions Given to Patient: Yes    Medical Diagnosis:   Infection of total knee replacement, initial encounter [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      Patient's medical condition is appropriate for Physical Therapy  intervention at this time.  Patient cleared by nurse to participate in PT session.    Subjective:  I don't stand up, I thought the doctor said I shouldn't  Patient is agreeable to participation in the therapy session.     Pain Assessment  Pain Assessment: Numeric Scale (0-10)  Pain Score: 2-mild pain  Pain Intervention(s): Cold applied;Repositioned          Objective:  Patient is in bed seen 6 Days Post-Op with IV, dressings and wound drain.                                         Cognition  Arousal/Alertness: Appropriate responses to stimuli  Attention Span: Appears intact  Following Commands: Follows all commands and directions without difficulty      Functional Mobility  Supine to Sit: Modified independent (Device) (patient did not sit EOB)  Scooting to EOB: Min assist to right  Sit to Stand: Unable to assess (Comment) (patient declined to stand, recomend for next session)  Stand to Sit: Unable to assess  (Comment)    Transfers  Bed to Chair: Min assist to right (patient slid from supine to seated in bed. against PT recom.)    Locomotion  Ambulation: Not performed    Therapeutic Exercise  Quad Sets: 25  Glute Sets: 25  Hip Abduction: 15  Ankle Pumps: 25       Self Care and Home Management:  Pt did not perform self care activities at this time.    Educated the patient to role of physical therapy, plan of care, goals  of therapy and HEP, safety with mobility and ADLs, energy conservation techniques.    Patient is seated in a bedside chair with all needs provided and call bell within reach. RN notified of session outcome.    Assessment:   Assessment  Assessment: Decreased LE strength;Decreased functional mobility;Decreased endurance/activity tolerance  Prognosis: Good;With continued PT status post acute discharge.     Patient declined standing and performed bed to [facility] transfer long sitting. PTA recommends patient to a standing SPT to [facility] with emphasis on continued knee extension and non -WB status.       Goals per Eval/Re-eval:     Goals  Goal Formulation: With patient;With patient/family  Time  for Goal Acheivement: 7 visits  Goals: Select goal  Pt Will Go Supine To Sit: With minimal assist;Goal met  Pt Will Perform Sit to Stand: With minimal assist  Pt Will Transfer to Toilet: With minimal assist  Pt Will Transfer to Tub/Shower: With minimal assist  PT Will Demonstrate Car Transfer Technique: With minimal assist  Pt Will Ambulate: 1-10 feet;with rolling walker;With minimal assist  Pt Will Go Up / Down Stairs: 3-5 stairs;With minimal assist  Pt Will Perform Home Exer Program: With minimal assist  Pt Will Perform LE Dressing w/Device: With min assist  Other Goal: Pt 10 % WB RLE with activity and with brace locked in extension          Plan:  Recommendation  Discharge Recommendation: Acute Rehab;SNF  DME Recommended for Discharge:  (None-has equipment at home)  PT - Next Visit Recommendation: 09/29/13  PT Frequency: twice a  day    Continue plan of care.      Signature: Clearnce Sorrel, PTA 09/28/2013 12:24 PM

## 2013-09-28 NOTE — Progress Notes (Signed)
PROGRESS NOTE    Date Time: 09/28/2013 7:46 AM  Patient Name: [name], [name]      Assessment:    Right TKA Irrigation and Debridement and Poly Exchange 09/05/13, 12/8 resection and antibiotic spacer, 12/15 antibiotic spacer exchange and plastic surgery coverage.   Plan:    PT - Non weight bearing in Extension with brace on, No bending knee, may get OOB to chair. NWB RLE NO knee ROM   DVT: ASA 325 bid   ID: Pan-Sensitive Proteus ssp Final Recommendations Cipro 750 bid for 6 weeks, on Ancef for now per ID   Drains in place - will D/C when plastic surgery recommends  D/c planning pending- soft tissue healing   Appreciate plastics input re: gastroc flap      Subjective:   Pain controlled, tolerating diet, participating in PT/OT    Medications:     Current Facility-Administered Medications   Medication Dose Route Frequency   . amLODIPine  10 mg Oral Daily   . bisoprolol  5 mg Oral Daily   . ceFAZolin  2 g Intravenous Q8H SCH   . celecoxib  200 mg Oral Daily   . cloNIDine  0.1 mg Oral Daily   . docusate sodium  100 mg Oral Q12H SCH   . ferrous sulfate 324 mg-ascorbic acid 500 mg combo dose   Oral Daily   . gabapentin  300 mg Oral BID   . hydrochlorothiazide  25 mg Oral Daily   . oxyCODONE  10 mg Oral Q12H SCH   . polycarbophil  1,250 mg Oral Daily   . polyethylene glycol  17 g Oral Daily   . potassium chloride  10 mEq Oral Daily   . Senna  17.2 mg Oral QHS   . tiotropium  18 mcg Inhalation QAM   . traMADol  50 mg Oral Q6H SCH       Physical Exam:     Filed Vitals:    09/28/13 0404   BP: 134/57   Pulse: 65   Temp: 97.9 F (36.6 C)   Resp: 18   SpO2: 94%       Intake and Output Summary (Last 24 hours) at Date Time    Intake/Output Summary (Last 24 hours) at 09/28/13 0746  Last data filed at 09/28/13 0400   Gross per 24 hour   Intake    450 ml   Output     30 ml   Net    420 ml       General appearance - alert and oriented, in no acute distress  Dressing c/d/i  Drains intact  Fires TA/EHL/GSC  SILT SP/DP/TN  2+ DP  pulse  Calves soft, nontender bilateral    Labs:     Recent CBC No results found for this basename: WHITEBLOODCE,RBC,HGB,HCT,,LABPLAT, NRBCA, REFLX, ANRBA in the last 24 hours  Recent BMP No results found for this basename: GLU,BUN,CREAT,CA,NA,K,CL,CO2,AGAP in the last 24 hours  Recent PT/INR No results found for this basename: PTI,INR,COUM,COUMP in the last 24 hours      Signed by: Cheryle Horsfall

## 2013-09-28 NOTE — Progress Notes (Signed)
[facility] INTERNAL MEDICINE PROGRESS NOTE    Date Time: 09/28/2013 9:16 AM  Patient Name: [name], [name]        Subjective:   "I'm doing good" No issues or concerns.       Medications:      Scheduled Meds: PRN Meds:           amLODIPine 10 mg Oral Daily   bisoprolol 5 mg Oral Daily   ceFAZolin 2 g Intravenous Q8H SCH   celecoxib 200 mg Oral Daily   cloNIDine 0.1 mg Oral Daily   docusate sodium 100 mg Oral Q12H SCH   ferrous sulfate 324 mg-ascorbic acid 500 mg combo dose  Oral Daily   gabapentin 300 mg Oral BID   hydrochlorothiazide 25 mg Oral Daily   oxyCODONE 10 mg Oral Q12H SCH   polycarbophil 1,250 mg Oral Daily   polyethylene glycol 17 g Oral Daily   potassium chloride 10 mEq Oral Daily   Senna 17.2 mg Oral QHS   tiotropium 18 mcg Inhalation QAM   traMADol 50 mg Oral Q6H SCH         Continuous Infusions:         albuterol 2.5 mg Q4H PRN   bisacodyl 10 mg QD PRN   diphenhydrAMINE 25 mg Q6H PRN   fentaNYL 25 mcg Q5 Min PRN   HYDROcodone-acetaminophen 1 tablet Q4H PRN   HYDROcodone-acetaminophen 2 tablet Q6H PRN   HYDROcodone-acetaminophen 2 tablet Q4H PRN   HYDROmorphone 0.5 mg Q1H PRN   HYDROmorphone 0.5 mg Q5 Min PRN   hydrOXYzine 25 mg Q12H PRN   meperidine 25 mg Q2H PRN   metoclopramide 10 mg Once PRN   naloxone 0.1 mg PRN   ondansetron 4 mg Q8H PRN   ondansetron 4 mg Once PRN   ondansetron 4 mg Q8H PRN   oxyCODONE-acetaminophen 1 tablet Q4H PRN   senna-docusate 2 tablet BID PRN   zolpidem 5 mg QHS PRN         I personally reviewed all of the medications      Review of Systems:   A comprehensive review of systems was obtained from chart review and the patient  General ROS: negative  Ophthalmic ROS: negative  Endocrine ROS: negative  Respiratory ROS: no cough, denies shortness of breath  Cardiovascular ROS: no chest pain or some dyspnea on exertion  Gastrointestinal ROS: no abdominal pain. +nausea after breakfast. Denies vomiting .   Genito-Urinary ROS: no c/o currently  Musculoskeletal ROS: right knee pain-  managed with pain meds  Neurological ROS: no TIA or stroke symptoms, denies dizziness      Physical Exam:     Filed Vitals:    09/27/13 1700 09/27/13 1941 09/28/13 0404 09/28/13 0836   BP: 150/66 150/68 134/57 155/68   Pulse: 64 66 65 69   Temp: 97.6 F (36.4 C) 97.3 F (36.3 C) 97.9 F (36.6 C) 96.8 F (36 C)   TempSrc:  Oral Oral    Resp: 18 18 18 19    Height:       Weight:       SpO2: 98% 97% 94% 96%         Intake and Output Summary (Last 24 hours) at Date Time    Intake/Output Summary (Last 24 hours) at 09/28/13 0916  Last data filed at 09/28/13 0859   Gross per 24 hour   Intake    690 ml   Output     30 ml   Net  660 ml       General appearance - awake, well appearing, and in no distress, c/o pain- just received meds  Mental status - alert, oriented to person, place, and time  Chest -Lungs clear, no rales or rhonchi, symmetric air entry  Heart - normal rate, regular rhythm, normal S1, S2, no murmurs  Abdomen - bowel sounds normal, abdomen is soft, nontender, non distended, obese  Neurological - alert, oriented, normal speech, no focal findings or movement disorder noted  Musculoskeletal -  Laying in bed.   Extremities - peripheral pulses normal, no pedal edema, no clubbing or cyanosis  RLE w/ kling wrap dressing      Labs:       Lab 09/25/13 0619 09/24/13 0424 09/23/13 0833 09/22/13 0444 09/21/13 1028   GLU 105* 108* 89 -- --   BUN 9 9 13  -- --   CREAT 0.7 0.7 0.6 -- --   CA 8.4 8.4 8.1 -- --   NA 140 139 140 -- --   K 3.6 4.1 3.9 -- --   CL 103 103 103 -- --   CO2 28 29 25  -- --   ALB -- -- -- 2.6* --   PHOS -- -- -- -- --   MG -- -- -- -- --   EGFR >60.0 >60.0 >60.0 -- --   PT -- -- -- -- 14.2   PTT -- -- -- -- --   INR -- -- -- -- 1.1         Lab 09/22/13 0444   AST 18   ALT 7   ALKPHOS 93   ALB 2.6*   BILITOTAL 0.7   AMY --   LIP --         Lab 09/25/13 0619 09/24/13 0424 09/23/13 0833   WBC 12.73* 12.77* 11.63*   HGB 10.1* 7.5* 7.8*   HCT 30.5* 23.6* 24.4*   MCV 84.5 85.8 85.0   MCH 28.0 27.3* 27.2*    MCHC 33.1 31.8* 32.0   RDW 15 16* 16*   MPV 9.9 9.4 9.0*       No results found for this basename: TSH,FREET3,FREET4 in the last 168 hours        Rads:     Radiology Results (24 Hour)     ** No Results found for the last 24 hours. **                  Assessment and Plan:      Patient Active Problem List   Diagnosis   . S/p R TKA, readmitted 09/05/13. S/p R TKA I&D and Poly Exchange, s/p Revision. S/p gastrocnemius muscle flap and skin graft- continue plan per  Ortho, ID, and Plastic Surgery.     Poor wound healing/Increased nutritional need- on Boost Breeze and Juven for supplementation   . Low back pain - controlled   . Chronic obstructive pulmonary disease- Continue spriva, d/c duoneb. Continue albuterol nebs prn. Currently stable. Continue to encourage IS use.    Marland Kitchen Hypertension- Norvasc recently increased.. Will monitor.    Post-operative Acute Anemia, s/p surgery. Continue Fe/Vit C. Pt s/p transfusions (2 units) 09/24/13. Currently asymptomatic.  Nausea: PRN anti-nausea medication. Tolerating diet.   OOB to chair non weight bearing with physical therapy.   Continue antibiotics per ID.       Post-operative pain- treat PRN per Ortho      Signed by: Alinda Dooms, NP

## 2013-09-28 NOTE — Plan of Care (Signed)
Problem: Pain  Goal: Patient's pain/discomfort is manageable  Outcome: Progressing  Pt states pain level 6/10 on r knee,relieved by 2 norco,tramadol as sheduled ,leg elevated on pillow.    Problem: Tissue integrity  Goal: Damaged tissue is healing and protected  Outcome: Progressing  S/p wound closure by flap r knee,dressing changed by order,sutures well approximated,scant bloody drainage at proximal part of incision,jpx2 intacts 4x4 gauzes and abd dressing applied,tol well

## 2013-09-29 LAB — COMPREHENSIVE METABOLIC PANEL
ALT: <6 U/L (ref 0–55)
AST (SGOT): 17 U/L (ref 5–34)
Albumin/Globulin Ratio: 0.7 — ABNORMAL LOW (ref 0.9–2.2)
Albumin: 2.2 g/dL — ABNORMAL LOW (ref 3.5–5.0)
Alkaline Phosphatase: 77 U/L (ref 40–150)
BUN: 9 mg/dL (ref 7–21)
Bilirubin, Total: 0.5 mg/dL (ref 0.2–1.2)
CO2: 29 mEq/L (ref 22–29)
Calcium: 8.4 mg/dL (ref 7.9–10.6)
Chloride: 102 mEq/L (ref 98–107)
Creatinine: 0.6 mg/dL (ref 0.6–1.0)
Globulin: 3.1 g/dL (ref 2.0–3.6)
Glucose: 90 mg/dL (ref 70–100)
Potassium: 3.2 mEq/L — ABNORMAL LOW (ref 3.5–5.1)
Protein, Total: 5.3 g/dL — ABNORMAL LOW (ref 6.0–8.3)
Sodium: 141 mEq/L (ref 136–145)

## 2013-09-29 LAB — CBC AND DIFFERENTIAL
Basophils Absolute Automated: 0.02 (ref 0.00–0.20)
Basophils Automated: 0 %
Eosinophils Absolute Automated: 0.54 (ref 0.00–0.70)
Eosinophils Automated: 7 %
Hematocrit: 29.7 % — ABNORMAL LOW (ref 37.0–47.0)
Hgb: 9.6 g/dL — ABNORMAL LOW (ref 12.0–16.0)
Immature Granulocytes Absolute: 0.01
Immature Granulocytes: 0 %
Lymphocytes Absolute Automated: 1.37 (ref 0.50–4.40)
Lymphocytes Automated: 18 %
MCH: 27.5 pg — ABNORMAL LOW (ref 28.0–32.0)
MCHC: 32.3 g/dL (ref 32.0–36.0)
MCV: 85.1 fL (ref 80.0–100.0)
MPV: 9.5 fL (ref 9.4–12.3)
Monocytes Absolute Automated: 0.73 (ref 0.00–1.20)
Monocytes: 10 %
Neutrophils Absolute: 4.75 (ref 1.80–8.10)
Neutrophils: 64 %
Nucleated RBC: 0 (ref 0–1)
Platelets: 272 (ref 140–400)
RBC: 3.49 — ABNORMAL LOW (ref 4.20–5.40)
RDW: 15 % (ref 12–15)
WBC: 7.41 (ref 3.50–10.80)

## 2013-09-29 LAB — SEDIMENTATION RATE: Sed Rate: 74 — ABNORMAL HIGH (ref 0–20)

## 2013-09-29 LAB — GFR: EGFR: >60

## 2013-09-29 LAB — C-REACTIVE PROTEIN: C-Reactive Protein: 5.2 mg/dL — ABNORMAL HIGH (ref 0.0–0.5)

## 2013-09-29 MED ORDER — CIPROFLOXACIN HCL 250 MG PO TABS
750.0000 mg | ORAL_TABLET | Freq: Two times a day (BID) | ORAL | Status: DC
Start: 2013-09-29 — End: 2013-10-03
  Administered 2013-09-29 – 2013-10-03 (×8): 750 mg via ORAL
  Filled 2013-09-29 (×8): qty 3

## 2013-09-29 MED ORDER — POTASSIUM CHLORIDE CRYS ER 20 MEQ PO TBCR
40.0000 meq | EXTENDED_RELEASE_TABLET | Freq: Once | ORAL | Status: AC
Start: 2013-09-29 — End: 2013-09-29
  Administered 2013-09-29: 40 meq via ORAL
  Filled 2013-09-29: qty 2

## 2013-09-29 NOTE — Progress Notes (Addendum)
[facility] INTERNAL MEDICINE PROGRESS NOTE    Date Time: 09/29/2013 8:06 AM  Patient Name: [name], [name]        Subjective:   "I'm doing fine."  No issues or concerns today.       Medications:      Scheduled Meds: PRN Meds:           amLODIPine 10 mg Oral Daily   bisoprolol 5 mg Oral Daily   ceFAZolin 2 g Intravenous Q8H SCH   celecoxib 200 mg Oral Daily   cloNIDine 0.1 mg Oral Daily   docusate sodium 100 mg Oral Q12H SCH   ferrous sulfate 324 mg-ascorbic acid 500 mg combo dose  Oral Daily   gabapentin 300 mg Oral BID   hydrochlorothiazide 25 mg Oral Daily   oxyCODONE 10 mg Oral Q12H SCH   polycarbophil 1,250 mg Oral Daily   polyethylene glycol 17 g Oral Daily   potassium chloride 10 mEq Oral Daily   Senna 17.2 mg Oral QHS   tiotropium 18 mcg Inhalation QAM   traMADol 50 mg Oral Q6H SCH         Continuous Infusions:         albuterol 2.5 mg Q4H PRN   bisacodyl 10 mg QD PRN   diphenhydrAMINE 25 mg Q6H PRN   fentaNYL 25 mcg Q5 Min PRN   HYDROcodone-acetaminophen 1 tablet Q4H PRN   HYDROcodone-acetaminophen 2 tablet Q6H PRN   HYDROcodone-acetaminophen 2 tablet Q4H PRN   HYDROmorphone 0.5 mg Q1H PRN   HYDROmorphone 0.5 mg Q5 Min PRN   hydrOXYzine 25 mg Q12H PRN   meperidine 25 mg Q2H PRN   metoclopramide 10 mg Once PRN   naloxone 0.1 mg PRN   ondansetron 4 mg Q8H PRN   ondansetron 4 mg Once PRN   ondansetron 4 mg Q8H PRN   oxyCODONE-acetaminophen 1 tablet Q4H PRN   senna-docusate 2 tablet BID PRN   zolpidem 5 mg QHS PRN         I personally reviewed all of the medications      Review of Systems:   A comprehensive review of systems was obtained from chart review and the patient  General ROS: negative  Ophthalmic ROS: negative  Endocrine ROS: negative  Respiratory ROS: no cough, denies shortness of breath  Cardiovascular ROS: no chest pain or some dyspnea on exertion  Gastrointestinal ROS: no abdominal pain. No nausea/vomiting, +BMs   Genito-Urinary ROS: no c/o currently  Musculoskeletal ROS: right knee pain- managed with  pain meds  Neurological ROS: no TIA or stroke symptoms, denies dizziness      Physical Exam:     Filed Vitals:    09/28/13 0836 09/28/13 1713 09/28/13 1923 09/29/13 0539   BP: 155/68 186/73 146/67 159/66   Pulse: 69 68 70 63   Temp: 96.8 F (36 C) 98.2 F (36.8 C) 97.9 F (36.6 C) 97.7 F (36.5 C)   TempSrc:   Oral    Resp: 19 18 18 18    Height:       Weight:       SpO2: 96%  94% 97%         Intake and Output Summary (Last 24 hours) at Date Time    Intake/Output Summary (Last 24 hours) at 09/29/13 0806  Last data filed at 09/29/13 0500   Gross per 24 hour   Intake    290 ml   Output     13 ml   Net  277 ml       General appearance - awake, well appearing, and in no distress, c/o pain- just received meds  Mental status - alert, oriented to person, place, and time  Chest -Lungs clear, no rales or rhonchi, symmetric air entry, no wheezing  Heart - normal rate, regular rhythm, normal S1, S2, no murmurs  Abdomen - bowel sounds normal, abdomen is soft, nontender, non distended, obese  Neurological - alert, oriented, normal speech, no focal findings or movement disorder noted  Musculoskeletal -  Sitting in chair.   Extremities - peripheral pulses normal, no pedal edema, no clubbing or cyanosis  RLE w/dressing      Labs:       Lab 09/29/13 0523 09/25/13 0619 09/24/13 0424   GLU 90 105* 108*   BUN 9 9 9    CREAT 0.6 0.7 0.7   CA 8.4 8.4 8.4   NA 141 140 139   K 3.2* 3.6 4.1   CL 102 103 103   CO2 29 28 29    ALB 2.2* -- --   PHOS -- -- --   MG -- -- --   EGFR >60.0 >60.0 >60.0   PT -- -- --   PTT -- -- --   INR -- -- --         Lab 09/29/13 0523   AST 17   ALT <6   ALKPHOS 77   ALB 2.2*   BILITOTAL 0.5   AMY --   LIP --         Lab 09/29/13 0523 09/25/13 0619 09/24/13 0424   WBC 7.41 12.73* 12.77*   HGB 9.6* 10.1* 7.5*   HCT 29.7* 30.5* 23.6*   MCV 85.1 84.5 85.8   MCH 27.5* 28.0 27.3*   MCHC 32.3 33.1 31.8*   RDW 15 15 16*   MPV 9.5 9.9 9.4       No results found for this basename: TSH,FREET3,FREET4 in the last 168  hours        Rads:     Radiology Results (24 Hour)     ** No Results found for the last 24 hours. **                  Assessment and Plan:      Patient Active Problem List   Diagnosis   . S/p R TKA, readmitted 09/05/13. S/p R TKA I&D and Poly Exchange, s/p Revision. S/p gastrocnemius muscle flap and skin graft- continue plan per  Ortho, ID, and Plastic Surgery.     Poor wound healing/Increased nutritional need- on Boost Breeze and Juven for supplementation   . Low back pain - controlled   . Chronic obstructive pulmonary disease- Continue spriva. Continue albuterol nebs prn- has not used recently. Currently stable. Continue to encourage IS use.    Marland Kitchen Hypertension- BP improved. Continue meds    Post-operative Acute Anemia, s/p surgery. Continue Fe/Vit C. Pt s/p transfusions. Currently asymptomatic.  Nausea over weekend- currently resolved.   OOB to chair non weight bearing with physical therapy.   Continue antibiotics per ID.   Post-operative pain- treat PRN per Ortho  Hypokalemia- replete.    Signed by: Sheryle Hail, NP

## 2013-09-29 NOTE — Progress Notes (Signed)
Patient vomitte after dinner, zofran given with relief  [name]

## 2013-09-29 NOTE — PT Progress Note (Addendum)
Physical Therapy Note    Physical Therapy Treatment    Patient:  [name]        MRN#:  [id number]  Unit:  [place] [facility]        Room/Bed:  M442/M442.01    Time of treatment: Start Time: 0800 Stop Time: 0830  Time Calculation (min): 30 min  PT Received On: 09/29/13    Treatment #: PT Visit Number: 4    Precautions  Weight Bearing Status: RLE non weight bearing  Total Knee Replacement:  (No extension brace. No exercises)  Precaution Instructions Given to Patient: Yes    Medical Diagnosis:   Infection of total knee replacement, initial encounter [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      Patient's medical condition is appropriate for Physical Therapy  intervention at this time.  Patient cleared by nurse to participate in PT session.    Subjective: Without c/o pain.    Patient is agreeable to participation in the therapy session.                Objective:  Patient is in bed seen 7 Days Post-Op with IV, dressings and hemovac.                                       Sensation is intact              Functional Mobility  Supine to Sit: Min assist to right (Therapist assist to maintain right knee extension)  Scooting to EOB: Min assist to right  Sit to Stand: Min assist (with walker)  Stand to Sit: Min assist    Transfers  Bed to Chair: Min assist to left  Stand Pivot Transfers: Minimal assistance  Device Used for Functional Transfer: front-wheeled walker    Locomotion  Ambulation: minimal assistance 2 feet.            Self Care and Home Management:  Pt did not perform self care activities at this time.    Educated the patient to role of physical therapy, plan of care, goals  of therapy and safety with mobility and  ADLs, weight bearing precautions.    Patient is seated in a bedside chair with all needs provided and call bell within reach. RN notified of session outcome.    Assessment:   Assessment  Assessment: Decreased LE ROM;Decreased LE strength;Decreased endurance/activity tolerance;Decreased functional mobility;Gait impairment  Prognosis: Good;With continued PT status post acute discharge.           Goals per Eval/Re-eval:     Goals  Goal Formulation: With patient;With patient/family  Time for Goal Acheivement: 7 visits  Goals: Select goal  Pt Will Go Supine To Sit: With minimal assist;Goal met  Pt Will Perform Sit to Stand: With minimal assist;Goal met  Pt Will Transfer to Toilet: With minimal assist  Pt Will Transfer to Tub/Shower: With minimal assist  PT Will Demonstrate Car Transfer Technique: With minimal assist  Pt Will Ambulate: 1-10 feet;with rolling walker;With minimal assist  Pt Will Go Up / Down Stairs: 3-5 stairs;With minimal assist  Pt Will Perform Home Exer Program: With minimal assist  Pt Will Perform LE Dressing w/Device: With min assist  Other Goal:  (New goal: NWB)          Plan:  Recommendation  Discharge Recommendation: Acute Rehab;SNF  DME Recommended for Discharge:  (leg loop lifter)  PT - Next Visit Recommendation: 09/29/13  PT Frequency: twice a day    Continue plan of care.      Signature: Marylene Land, PT 09/29/2013 9:09 AM

## 2013-09-29 NOTE — Plan of Care (Signed)
Problem: Pain  Goal: Patient's pain/discomfort is manageable  Outcome: Progressing  Pain level 4-5/10 right knee relieved by 1 norco and scheduled tramadol and oxycodone

## 2013-09-29 NOTE — Progress Notes (Signed)
Auth requested with [facility] and clinicals sent to [facility], Awaiting call back.     Earline Mayotte RN/ Clinical Admissions Liaison [facility] Acute Rehab

## 2013-09-29 NOTE — OT Progress Note (Signed)
Occupational Therapy Note    [facility]  [address]  [place] [address]  [phone]    Occupational Therapy Treatment    Patient: [name]    MRN#: [id number]  Unit: [place] [facility]         Bed: E831/D176.16    Medical Diagnosis: Infection of total knee replacement, initial encounter [996.66, V43.65] (INFECTED RT KNEE)  Total knee replacement status [V43.65] (Total knee replacement status)    Time of treatment:    Time Calculation  OT Received On: 09/29/13  Start Time: 1405  Stop Time: 1448  Time Calculation (min): 43 min    Treatment #  OT Visit Number: 2/5    Precautions and Contraindications:  Fall, NWB R LE    Subjective: "You have given me a lot to think about."  Patient's medical condition is appropriate for Occupational Therapy  intervention at this time.  Patient is agreeable to participation in the therapy session. Nursing clears patient for therapy.  Pain: 2/10    Objective:  Patient is in bed with Intravenous Access, Dressing and 2 drains to LE in place.     Cognition:  Follows directions consistently    Self Care:  Discussed and simulated modified dressing in bed with pt and her daughter.      Functional Mobility:  Supine to Sit: min A with bed rail and HOB elevated  Sit to Supine: Min A with bed rails and HOB flat  Sit to Stand: Min A with RW and verbal cues for NWB   Transfers: side step along side of bed with mod A and verbal cues for NWB and Knee extenstion    Treatment Activities: ADL and transfer retraining    Observation of Patient:  Vitals:   Filed Vitals:    09/28/13 1713 09/28/13 1923 09/29/13 0539 09/29/13 0900   BP: 186/73 146/67 159/66 148/63   Pulse: 68 70 63 65   Temp: 98.2 F (36.8 C) 97.9 F (36.6 C) 97.7 F (36.5 C) 96.4 F (35.8 C)   TempSrc:  Oral     Resp: 18 18 18 20    Height:       Weight:       SpO2:  94% 97% 95%       Education:   Educated the patient to role of occupational therapy, plan of care, goals of therapy and  safety with mobility and ADLs, weight bearing precautions, home safety.    Patient left in bed with all needs met, equipment intact and call bell within reach. RN notified of session outcome.  Updated status posted at bedside.    Assessment:  Pt tolerated session well however continues to demonstrate difficulty with NWB restriction and use of RW.  Pt and her daughter are aware of precautions and need for further rehab to increase safety and functional independence.    Goals:  Time For Goal Achievement: 5 visits  ADL Goals  Patient will dress lower body: with minimal assist;with AE;Partly met  Pt will complete bathing: With minimal assist;Not met  Patient will toilet: with minimal assist;Partly met  Mobility and Transfer Goals  Pt will transfer bed to [facility]: Partly met (with CGA)     Executive Fucntion Goals  Other Goal: Pt will independenlty maintain NWB status 100% of the time during ADLs and functional mobility (partly met)        OT Plan  Treatment Interventions: ADL retraining;Functional transfer training;UE strengthening/ROM;Endurance training;Patient/Family training;Equipment  eval/education;Compensatory technique education;Continued evaluation  Progress: Progressing toward goals  Discharge Recommendation: Acute Rehab  OT Frequency Recommended: 4-5x/wk    Continue plan of care.    Signature:   Darci Needle, OT  09/29/2013 2:52 PM   Phone: [phone]

## 2013-09-29 NOTE — Progress Notes (Signed)
Infectious Diseases  Progress Note     Impression:   53 yof s/p resection arthroplasty of right knee and spacer exchange and flap 12/18 Original Cx were Proteus mirabilis which has not grown out since.       Recommendations:   Will change to Cipro today.    Subjective:   No new problems noted    Review of Systems  Patient without nausea, vomiting, diarrhea, rash, cough, shortness of breath, abdominal or chest pain.      Objective:   BP 148/63  Pulse 65  Temp 96.4 F (35.8 C) (Oral)  Resp 20  Ht 1.6 m (5\' 3" )  Wt 112 kg (246 lb 14.6 oz)  BMI 43.75 kg/m2  SpO2 95%    Temp (24hrs), Avg:97.6 F (36.4 C), Min:96.4 F (35.8 C), Max:98.2 F (36.8 C)      General: awake, alert, oriented x 3; no acute distress.  Cardiovascular: regular rate and rhythm, no murmurs,  Lungs: clear to auscultation bilaterally, without wheezing,   Abdomen: soft, non-tender, non-distended; no palpable masses, no hepatosplenomegaly, normoactive bowel sounds,   Extremities: no clubbing, cyanosis, or edema          Labs:    Lab 09/29/13 0523 09/25/13 0619 09/24/13 0424   WBC 7.41 12.73* 12.77*   HGB 9.6* 10.1* 7.5*   HCT 29.7* 30.5* 23.6*   PLT 272 288 331   NEUTROPCT -- -- --   MONOPCT -- -- --       Lab 09/29/13 0523 09/25/13 0619 09/24/13 0424   NA 141 140 139   K 3.2* 3.6 4.1   CL 102 103 103   CO2 29 28 29    BUN 9 9 9    CREAT 0.6 0.7 0.7   CA 8.4 8.4 8.4   ALB 2.2* -- --   PROT 5.3* -- --   BILITOTAL 0.5 -- --   ALKPHOS 77 -- --   ALT <6 -- --   AST 17 -- --   GLU 90 105* 108*

## 2013-09-29 NOTE — Progress Notes (Signed)
PROGRESS NOTE    Date Time: 09/29/2013 7:21 PM  Patient Name: [name], [name]      Assessment:    Right TKA Irrigation and Debridement and Poly Exchange 09/05/13, 12/8 resection and antibiotic spacer, 12/15 antibiotic spacer exchange and plastic surgery coverage.   Plan:    PT - Non weight bearing in Extension with brace on, No bending knee, may get OOB to chair. NWB RLE NO knee ROM   DVT: ASA 325 bid   ID: Pan-Sensitive Proteus ssp Final Recommendations Cipro 750 bid for 6 weeks, on Ancef for now per ID   Drains in place - will D/C when plastic surgery recommends  D/c planning pending- soft tissue healing   Appreciate plastics input re: gastroc flap      Subjective:   Pain controlled, tolerating diet, participating in PT/OT    Medications:     Current Facility-Administered Medications   Medication Dose Route Frequency   . amLODIPine  10 mg Oral Daily   . bisoprolol  5 mg Oral Daily   . celecoxib  200 mg Oral Daily   . ciprofloxacin  750 mg Oral Q12H [facility]   . cloNIDine  0.1 mg Oral Daily   . docusate sodium  100 mg Oral Q12H SCH   . ferrous sulfate 324 mg-ascorbic acid 500 mg combo dose   Oral Daily   . gabapentin  300 mg Oral BID   . hydrochlorothiazide  25 mg Oral Daily   . oxyCODONE  10 mg Oral Q12H SCH   . polycarbophil  1,250 mg Oral Daily   . polyethylene glycol  17 g Oral Daily   . potassium chloride  10 mEq Oral Daily   . [COMPLETED] potassium chloride  40 mEq Oral Once   . Senna  17.2 mg Oral QHS   . tiotropium  18 mcg Inhalation QAM   . traMADol  50 mg Oral Q6H SCH   . [DISCONTINUED] ceFAZolin  2 g Intravenous Q8H [facility]       Physical Exam:     Filed Vitals:    09/29/13 1554   BP: 155/67   Pulse: 65   Temp: 97.2 F (36.2 C)   Resp: 20   SpO2: 95%       Intake and Output Summary (Last 24 hours) at Date Time    Intake/Output Summary (Last 24 hours) at 09/29/13 1921  Last data filed at 09/29/13 0500   Gross per 24 hour   Intake     50 ml   Output      3 ml   Net     47 ml       General appearance - alert and  oriented, in no acute distress  Dressing c/d/i  Drains intact  Fires TA/EHL/GSC  SILT SP/DP/TN  2+ DP pulse  Calves soft, nontender bilateral    Labs:     Recent CBC   Recent Labs   Basename 09/29/13 0523    RBC 3.49*    HGB 9.6*    HCT 29.7*    LABPLAT --     Recent BMP   Recent Labs   Basename 09/29/13 0523    GLU 90    BUN 9    CREAT 0.6    CA 8.4    NA 141    K 3.2*    CL 102    CO2 29     Recent PT/INR No results found for this basename: PTI,INR,COUM,COUMP in the last 24 hours  Signed by: Garey Ham

## 2013-09-29 NOTE — Plan of Care (Signed)
Problem: Health Promotion  Goal: Knowledge - health resources  Extent of understanding and conveyed about healthcare resources.   Intervention: Discharge planning  Faxed clinical update to Elena at [employer].

## 2013-09-30 NOTE — OT Progress Note (Signed)
[facility]  [address]  [place] [address]  [phone]    Occupational Therapy Treatment    Patient: [name]    MRN#: [id number]  Unit: [place] [facility]         Bed: V784/O962.95    Medical Diagnosis: Infection of total knee replacement, initial encounter [996.66, V43.65] (INFECTED RT KNEE)  Total knee replacement status [V43.65] (Total knee replacement status)    Time of treatment:    Time Calculation  OT Received On: 09/30/13  Start Time: 1201  Stop Time: 1225  Time Calculation (min): 24 min    Treatment #  OT Visit Number: 3/5    Precautions and Contraindications:  Fall  NWB RLE  No knee flex in RLE     Subjective: "I'm doing better"  Patient's medical condition is appropriate for Occupational Therapy  intervention at this time.  Patient is agreeable to participation in the therapy session. Family and/or guardian are agreeable to patient's participation in the therapy session. Nursing clears patient for therapy.  Pain: 3/10  Location: RLE  Therapist Intervention: RN aware. OT assisted with positioning.   Patient is satisfied with therapist intervention.    Objective:  Patient is seated in wheelchair next to bed with Intravenous Access and Dressing  in place.     Cognition:  WFL     Self Care:  Feeding: NT  Grooming: supervision; seated in chair  Bathing: NT (pt had already completed prior)  UB Dressing: independent; seated in wheelchair   LB Dressing: Mod A using reacher and sock aid for socks and pants   Toileting: NT     Functional Mobility:  Sit to Stand: Min A with FWW  Standing to Sit: Min A with FWW      B UE Exercises: (using medium resistance Thera-band seated in wheelchair with cueing):  Shoulder Flexion: 2 X 10   Shoulder Abduction: 2 X 10   Elbow Flexion: 2 X 10   Elbow Extenison: 2 X 10   Internal Rotation/External Rotation: 2 X 10     Treatment Activities: ADLs, functional mobility, BUE exercises     Observation of Patient:  Vitals:     Filed  Vitals:    09/29/13 1554 09/29/13 2046 09/30/13 0449 09/30/13 0940   BP: 155/67 149/66 135/84 125/60   Pulse: 65 65 64 68   Temp: 97.2 F (36.2 C) 96.8 F (36 C) 97.9 F (36.6 C) 96.4 F (35.8 C)   TempSrc:       Resp: 20 20 20 18    Height:       Weight:       SpO2: 95% 99% 97% 93%       Education:   Educated the patient to role of occupational therapy, plan of care,  goals of therapy and HEP, safety with mobility and ADLs, weight bearing precautions, LE ADL equipment and techniques.    Patient left in wheelchair next to bed with all needs met, equipment intact and call bell within reach. RN notified of session outcome.  Updated status posted at bedside.    Assessment:  Pt demonstrates improvement with activity tolerance and ability to complete her LE ADLs seated in wheelchair today. Pt's ability to complete ADLs and functional transfers continues to be impaired due to the following deficits: Pain, activity tolerance, standing balance, NWB status to RLE, safety awareness. Pt would continue to benefit from OT to address these deficits and increase independence with  ADLs and functional transfers. Pt is very motivated to participate with therapy.     Goals:  Time For Goal Achievement: 5 visits  ADL Goals  Patient will groom self:  (standing at sink with CGA)  Patient will dress lower body: with minimal assist;with AE  Pt will complete bathing: With minimal assist  Patient will toilet: with minimal assist  Mobility and Transfer Goals  Pt will transfer bed to [facility]:  (with CGA)     Executive Fucntion Goals  Other Goal: Pt will independenlty maintain NWB status 100% of the time during ADLs and functional mobility    OT Plan  Treatment Interventions: ADL retraining;Functional transfer training;UE strengthening/ROM;Endurance training;Patient/Family training;Equipment eval/education;Compensatory technique education;Continued evaluation  Discharge Recommendation: Acute Rehab  DME Recommended for Discharge:  (defer to next  level of rehab)  OT Frequency Recommended: 4-5x/wk    Continue plan of care.    Signature:   Louie Boston, OT  09/30/2013 2:58 PM   Phone: [phone]

## 2013-09-30 NOTE — PT Progress Note (Signed)
Physical Therapy Note    Physical Therapy Treatment    Patient:  [name]        MRN#:  [id number]  Unit:  [place] [facility]        Room/Bed:  M442/M442.01    Time of treatment: Start Time: 1100 Stop Time: 0830     PT Received On: 09/30/13    Treatment #: PT Visit Number: 4         Medical Diagnosis:   Infection of total knee replacement, initial encounter [996.66, V43.65] (INFECTED RT KNEE)  Total knee replacement status [V43.65] (Total knee replacement status)  Surgical Procedure:Right  knee Procedure(s):  ARTHROPLASTY, KNEE, REVISION TOTAL  DEBRIDEMENT, MUSCLE FLAP CLOSURE  GRAFT, SKIN  SPLIT THICKNESS, (MEDICAL) performed by  Lane Hacker, MD on 09/05/2013 - 09/22/2013.      Patient's medical condition is appropriate for Physical Therapy  intervention at this time.  Patient cleared by nurse to participate in PT session, nursing reports .  New order noted to increase WB RLE to 10%.    Subjective:   Patient is agreeable to participation in the therapy session. Family and/or guardian are agreeable to patient's participation in the therapy session. Nursing clears patient for therapy.                Objective:  Patient is in bed seen 8 Days Post-Op with IV and dressings.                                       Sensation is intact                             Therapeutic Exercise  Quad Sets: 25  Glute Sets: 25  Hip Abduction: 20AA  Ankle Pumps: 25         Educated the patient to role of physical therapy, plan of care, goals  of therapy and HEP, safety with mobility and ADLs, energy conservation techniques, weight bearing precautions.    Patient is seated in wheelchair with all needs provided and call bell within reach. RN notified of session outcome, including .    Assessment: Pt very cooperative and motivated, but became tearful when son had left and she was discussing upcoming procedures and rehab course with NP.   Assessment  Assessment: Decreased LE strength;Decreased endurance/activity  tolerance  Prognosis: Good;With continued PT status post acute discharge.           Goals per Eval/Re-eval:     Goals  Goal Formulation: With patient;With patient/family  Time for Goal Acheivement: 7 visits  Goals: Select goal  Pt Will Go Supine To Sit: With minimal assist;Goal met  Pt Will Perform Sit to Stand: With minimal assist;Goal met  Pt Will Transfer to Toilet: With minimal assist  Pt Will Transfer to Tub/Shower: With minimal assist  PT Will Demonstrate Car Transfer Technique: With minimal assist  Pt Will Ambulate: 1-10 feet;with rolling walker;With minimal assist  Pt Will Go Up / Down Stairs: 3-5 stairs;With minimal assist  Pt Will Perform Home Exer Program: With minimal assist  Pt Will Perform LE Dressing w/Device: With min assist  Other Goal:  (New goal: NWB)          Plan:  Recommendation  Discharge Recommendation: Acute Rehab;SNF  DME Recommended for Discharge:  (leg loop lifter)  PT - Next Visit Recommendation: 09/29/13  PT Frequency: twice a day    Continue plan of care.      Signature: Chester Holstein, PT 09/30/2013 11:22 AM

## 2013-09-30 NOTE — PT Progress Note (Signed)
Physical Therapy Treatment    Patient:  [name]        MRN#:  [id number]  Unit:  [place] [facility]        Room/Bed:  M442/M442.01    Time of treatment: Start Time: 1130 Stop Time: 1200  Time Calculation (min): 30 min  PT Received On: 09/30/13    Treatment #: PT Visit Number: 5/10    Precautions  Weight Bearing Status: RLE partial weight bearing  Weight Bearing Percent: 10   Total Knee Replacement:  (No extension brace. No exercises)  Precaution Instructions Given to Patient: Yes    Medical Diagnosis:   Infection of total knee replacement, initial encounter [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      Patient's medical condition is appropriate for Physical Therapy  intervention at this time.  Patient cleared by nurse to participate in PT session.    Subjective: I was hoping to go to Rehab, not sure if that will happen or not.   I came out home.  Patient is agreeable to participation in the therapy session.     Pain Assessment  Pain Assessment: Numeric Scale (0-10)  Pain Score: 2-mild pain          Objective:  Patient is in bed seen 8 Days Post-Op with dressings and telemetry.                                       Sensation is intact              Functional Mobility  Supine to Sit: Stand by assistance  Sit to Stand: Stand by assistance  Stand to Sit: Stand by assistance    Transfers  Bed to Chair: Stand by assistance  Stand Pivot Transfers: Supervision  Device Used for Functional Transfer: front-wheeled walker            Self Care and Home Management:  Pt did not perform self care activities at this time.    Educated the patient to role of physical therapy, plan of care, goals  of therapy and safety with mobility  and ADLs, weight bearing precautions, home safety.    Patient is seated in wheelchair with all needs provided and call bell within reach. RN notified of session outcome, including patient transferred to a w/c in her room and OT was beginning their treatment..    Assessment:   Assessment  Assessment: Decreased LE strength;Decreased endurance/activity tolerance;Gait impairment;Decreased LE ROM  Prognosis: Good;With continued PT status post acute discharge.           Goals per Eval/Re-eval:     Goals  Goal Formulation: With patient;With patient/family  Time for Goal Acheivement: 7 visits  Goals: Select goal  Pt Will Go Supine To Sit: With minimal assist;Goal met  Pt Will Perform Sit to Stand: With minimal assist;Goal met  Pt Will Transfer to Toilet: With minimal assist  Pt Will Transfer to Tub/Shower: With minimal assist  PT Will Demonstrate Car Transfer Technique: With minimal assist  Pt Will Ambulate: 1-10 feet;with rolling walker;With minimal assist (10% RLE WB)  Pt Will Go Up / Down Stairs:  Discontinued (comment)  Pt Will Perform Home Exer Program: With minimal assist  Pt Will Perform LE Dressing w/Device: With min assist  Other Goal:  (New goal: NWB)          Plan:  Recommendation  Discharge Recommendation: Acute Rehab  DME Recommended for Discharge:  (leg loop lifter)  PT - Next Visit Recommendation: 10/01/13  PT Frequency: 7x/wk    Continue plan of care.      Signature: Louann Liv, PTA 09/30/2013 4:50 PM

## 2013-09-30 NOTE — Progress Notes (Signed)
Called [employer] to request update on precert status.  No decision as been made by [facility] yet.  They will call me when decision is made.  Will keep CM posted.  [place], [place] [id number]

## 2013-09-30 NOTE — Plan of Care (Signed)
Problem: Health Promotion  Goal: Knowledge - health resources  Extent of understanding and conveyed about healthcare resources.   Intervention: Discharge planning  Met with pt and family.   Referral to acute rehab at [facility] still pending ins auth.  Await auth/bed availability.

## 2013-09-30 NOTE — Progress Notes (Signed)
PROGRESS NOTE    Date Time: 09/30/2013 1:57 PM  Patient Name: [name], [name]      Assessment:   POD #5 s/p right medial gastroc to knee with primary closure of knee wound.    Plan:   JPs d/c'ed. Continue staples, sutures.   Continue dry dressing to anterior wound and JP sites daily.  Do not bend right knee but weight bear as per Ortho.  Ok to transfer to acute rehab.   Will follow.  Subjective:   Pain and activity levels improved.     Medications:     Current Facility-Administered Medications   Medication Dose Route Frequency   . amLODIPine  10 mg Oral Daily   . bisoprolol  5 mg Oral Daily   . celecoxib  200 mg Oral Daily   . ciprofloxacin  750 mg Oral Q12H SCH   . cloNIDine  0.1 mg Oral Daily   . docusate sodium  100 mg Oral Q12H SCH   . ferrous sulfate 324 mg-ascorbic acid 500 mg combo dose   Oral Daily   . gabapentin  300 mg Oral BID   . hydrochlorothiazide  25 mg Oral Daily   . oxyCODONE  10 mg Oral Q12H SCH   . polycarbophil  1,250 mg Oral Daily   . polyethylene glycol  17 g Oral Daily   . potassium chloride  10 mEq Oral Daily   . Senna  17.2 mg Oral QHS   . tiotropium  18 mcg Inhalation QAM   . traMADol  50 mg Oral Q6H SCH       Review of Systems:   A comprehensive review of systems was: Musculoskeletal ROS: pain improved; weight bearing as per Ortho  Dermatological ROS: dressings in place    Physical Exam:     Filed Vitals:    09/30/13 0940   BP: 125/60   Pulse: 68   Temp: 96.4 F (35.8 C)   Resp: 18   SpO2: 93%       Intake and Output Summary (Last 24 hours) at Date Time    Intake/Output Summary (Last 24 hours) at 09/30/13 1357  Last data filed at 09/30/13 0500   Gross per 24 hour   Intake      0 ml   Output      5 ml   Net     -5 ml       Skin - RLE: anterior incision c/d/i, some ecchymosis at distal aspect of incision; medial incision c/d/i; JPs minimal drainage, serosanguinous; sutures, staples in place      Signed by: Ernie Avena

## 2013-09-30 NOTE — Progress Notes (Signed)
PROGRESS NOTE    Date Time: 09/30/2013 7:39 AM  Patient Name: [name], [name]      Assessment:    Right TKA Irrigation and Debridement and Poly Exchange 09/05/13, 12/8 resection and antibiotic spacer, 12/15 antibiotic spacer exchange and plastic surgery coverage.   Plan:    PT - Non weight bearing in Extension with brace on, No bending knee, may get OOB to chair. NWB RLE NO knee ROM   DVT: ASA 325 bid   ID: Pan-Sensitive Proteus ssp Final Recommendations Cipro 750 bid for 6 weeks, on Ancef for now per ID   Drains in place - will D/C when plastic surgery recommends  D/c planning pending- soft tissue healing   Appreciate plastics input re: gastroc flap      Subjective:   Pain controlled, tolerating diet, participating in PT/OT    Medications:     Current Facility-Administered Medications   Medication Dose Route Frequency   . amLODIPine  10 mg Oral Daily   . bisoprolol  5 mg Oral Daily   . celecoxib  200 mg Oral Daily   . ciprofloxacin  750 mg Oral Q12H SCH   . cloNIDine  0.1 mg Oral Daily   . docusate sodium  100 mg Oral Q12H SCH   . ferrous sulfate 324 mg-ascorbic acid 500 mg combo dose   Oral Daily   . gabapentin  300 mg Oral BID   . hydrochlorothiazide  25 mg Oral Daily   . oxyCODONE  10 mg Oral Q12H SCH   . polycarbophil  1,250 mg Oral Daily   . polyethylene glycol  17 g Oral Daily   . potassium chloride  10 mEq Oral Daily   . [COMPLETED] potassium chloride  40 mEq Oral Once   . Senna  17.2 mg Oral QHS   . tiotropium  18 mcg Inhalation QAM   . traMADol  50 mg Oral Q6H SCH   . [DISCONTINUED] ceFAZolin  2 g Intravenous Q8H [facility]       Physical Exam:     Filed Vitals:    09/30/13 0449   BP: 135/84   Pulse: 64   Temp: 97.9 F (36.6 C)   Resp: 20   SpO2: 97%       Intake and Output Summary (Last 24 hours) at Date Time    Intake/Output Summary (Last 24 hours) at 09/30/13 0739  Last data filed at 09/30/13 0500   Gross per 24 hour   Intake      0 ml   Output      5 ml   Net     -5 ml       General appearance - alert and  oriented, in no acute distress  Dressing c/d/i  Drains intact  Fires TA/EHL/GSC  SILT SP/DP/TN  2+ DP pulse  Calves soft, nontender bilateral    Labs:     Recent CBC   No results found for this basename: WHITEBLOODCE,RBC,HGB,HCT,,LABPLAT, NRBCA, REFLX, ANRBA in the last 24 hours  Recent BMP   No results found for this basename: GLU,BUN,CREAT,CA,NA,K,CL,CO2,AGAP in the last 24 hours  Recent PT/INR No results found for this basename: PTI,INR,COUM,COUMP in the last 24 hours      Signed by: Garey Ham

## 2013-09-30 NOTE — Progress Notes (Signed)
[facility] PROGRESS NOTE    Date Time: 09/30/2013 7:56 AM  Patient Name: [name],[name]        Subjective:   "I'm doing OK. The best I can."        Medications:      Scheduled Meds: PRN Meds:           amLODIPine 10 mg Oral Daily   bisoprolol 5 mg Oral Daily   celecoxib 200 mg Oral Daily   ciprofloxacin 750 mg Oral Q12H SCH   cloNIDine 0.1 mg Oral Daily   docusate sodium 100 mg Oral Q12H SCH   ferrous sulfate 324 mg-ascorbic acid 500 mg combo dose  Oral Daily   gabapentin 300 mg Oral BID   hydrochlorothiazide 25 mg Oral Daily   oxyCODONE 10 mg Oral Q12H SCH   polycarbophil 1,250 mg Oral Daily   polyethylene glycol 17 g Oral Daily   potassium chloride 10 mEq Oral Daily   [COMPLETED] potassium chloride 40 mEq Oral Once   Senna 17.2 mg Oral QHS   tiotropium 18 mcg Inhalation QAM   traMADol 50 mg Oral Q6H SCH   [DISCONTINUED] ceFAZolin 2 g Intravenous Q8H SCH         Continuous Infusions:         albuterol 2.5 mg Q4H PRN   bisacodyl 10 mg QD PRN   diphenhydrAMINE 25 mg Q6H PRN   fentaNYL 25 mcg Q5 Min PRN   HYDROcodone-acetaminophen 1 tablet Q4H PRN   HYDROcodone-acetaminophen 2 tablet Q6H PRN   HYDROcodone-acetaminophen 2 tablet Q4H PRN   HYDROmorphone 0.5 mg Q1H PRN   HYDROmorphone 0.5 mg Q5 Min PRN   hydrOXYzine 25 mg Q12H PRN   meperidine 25 mg Q2H PRN   metoclopramide 10 mg Once PRN   naloxone 0.1 mg PRN   ondansetron 4 mg Q8H PRN   ondansetron 4 mg Once PRN   ondansetron 4 mg Q8H PRN   oxyCODONE-acetaminophen 1 tablet Q4H PRN   senna-docusate 2 tablet BID PRN   zolpidem 5 mg QHS PRN         I personally reviewed all of the medications      Review of Systems:   A comprehensive review of systems was obtained from chart review and the patient  General ROS: negative  Ophthalmic ROS: negative  Endocrine ROS: negative  Respiratory ROS: no cough, denies shortness of breath  Cardiovascular ROS: no chest pain or some dyspnea on exertion  Gastrointestinal ROS: no abdominal pain. No nausea/vomiting, +BMs    Genito-Urinary ROS: no c/o currently  Musculoskeletal ROS: right knee pain- managed with pain meds  Neurological ROS: no TIA or stroke symptoms, denies dizziness      Physical Exam:     Filed Vitals:    09/29/13 0900 09/29/13 1554 09/29/13 2046 09/30/13 0449   BP: 148/63 155/67 149/66 135/84   Pulse: 65 65 65 64   Temp: 96.4 F (35.8 C) 97.2 F (36.2 C) 96.8 F (36 C) 97.9 F (36.6 C)   TempSrc:       Resp: 20 20 20 20    Height:       Weight:       SpO2: 95% 95% 99% 97%         Intake and Output Summary (Last 24 hours) at Date Time    Intake/Output Summary (Last 24 hours) at 09/30/13 0756  Last data filed at 09/30/13 0500   Gross per 24 hour   Intake  0 ml   Output      5 ml   Net     -5 ml       General appearance - awake, well appearing, and in no distress, c/o pain- just received meds  Mental status - alert, oriented to person, place, and time  Chest -Lungs clear, no rales or rhonchi, symmetric air entry, no wheezing  Heart - normal rate, regular rhythm, normal S1, S2, no murmurs  Abdomen - bowel sounds normal, abdomen is soft, nontender, non distended, obese  Neurological - alert, oriented, normal speech, no focal findings or movement disorder noted  Musculoskeletal - laying on bed   Extremities - peripheral pulses normal, no pedal edema, no clubbing or cyanosis  RLE w/dressing      Labs:       Lab 09/29/13 0523 09/25/13 0619 09/24/13 0424   GLU 90 105* 108*   BUN 9 9 9    CREAT 0.6 0.7 0.7   CA 8.4 8.4 8.4   NA 141 140 139   K 3.2* 3.6 4.1   CL 102 103 103   CO2 29 28 29    ALB 2.2* -- --   PHOS -- -- --   MG -- -- --   EGFR >60.0 >60.0 >60.0   PT -- -- --   PTT -- -- --   INR -- -- --         Lab 09/29/13 0523   AST 17   ALT <6   ALKPHOS 77   ALB 2.2*   BILITOTAL 0.5   AMY --   LIP --         Lab 09/29/13 0523 09/25/13 0619 09/24/13 0424   WBC 7.41 12.73* 12.77*   HGB 9.6* 10.1* 7.5*   HCT 29.7* 30.5* 23.6*   MCV 85.1 84.5 85.8   MCH 27.5* 28.0 27.3*   MCHC 32.3 33.1 31.8*   RDW 15 15 16*   MPV 9.5 9.9  9.4       No results found for this basename: TSH,FREET3,FREET4 in the last 168 hours        Rads:     Radiology Results (24 Hour)     ** No Results found for the last 24 hours. **                  Assessment and Plan:      Patient Active Problem List   Diagnosis   . S/p R TKA, readmitted 09/05/13. S/p R TKA I&D and Poly Exchange, s/p Revision. S/p gastrocnemius muscle flap and skin graft- continue plan per  Ortho, ID, and Plastic Surgery.     Poor wound healing/Increased nutritional need- on Boost Breeze and Juven for supplementation   . Low back pain - controlled   . Chronic obstructive pulmonary disease- Continue spriva. Continue albuterol nebs prn- has not used recently. Currently stable. Continue to encourage IS use.    Marland Kitchen Hypertension- BP improved. Continue meds    Post-operative Acute Anemia, s/p surgery. Continue Fe/Vit C. Pt s/p transfusions. Currently asymptomatic.     Post-operative pain- treat PRN per Ortho  Hypokalemia- s/p repletion.  Pt tearful today. Pt is proactive with recovery, but is not looking forward to additional pain that will come with knee revision surgery. Family very supportive.    Signed by: Sheryle Hail, NP

## 2013-10-01 NOTE — Progress Notes (Signed)
[facility] PROGRESS NOTE    Date Time: 10/01/2013 7:01 AM  Patient Name: [name],[name]        Subjective:   "I'm doing better today."        Medications:      Scheduled Meds: PRN Meds:           amLODIPine 10 mg Oral Daily   bisoprolol 5 mg Oral Daily   celecoxib 200 mg Oral Daily   ciprofloxacin 750 mg Oral Q12H SCH   cloNIDine 0.1 mg Oral Daily   docusate sodium 100 mg Oral Q12H SCH   ferrous sulfate 324 mg-ascorbic acid 500 mg combo dose  Oral Daily   gabapentin 300 mg Oral BID   hydrochlorothiazide 25 mg Oral Daily   oxyCODONE 10 mg Oral Q12H SCH   polycarbophil 1,250 mg Oral Daily   polyethylene glycol 17 g Oral Daily   potassium chloride 10 mEq Oral Daily   Senna 17.2 mg Oral QHS   tiotropium 18 mcg Inhalation QAM   traMADol 50 mg Oral Q6H SCH         Continuous Infusions:         albuterol 2.5 mg Q4H PRN   bisacodyl 10 mg QD PRN   diphenhydrAMINE 25 mg Q6H PRN   fentaNYL 25 mcg Q5 Min PRN   HYDROcodone-acetaminophen 1 tablet Q4H PRN   HYDROcodone-acetaminophen 2 tablet Q6H PRN   HYDROcodone-acetaminophen 2 tablet Q4H PRN   HYDROmorphone 0.5 mg Q1H PRN   HYDROmorphone 0.5 mg Q5 Min PRN   hydrOXYzine 25 mg Q12H PRN   meperidine 25 mg Q2H PRN   metoclopramide 10 mg Once PRN   naloxone 0.1 mg PRN   ondansetron 4 mg Q8H PRN   ondansetron 4 mg Once PRN   ondansetron 4 mg Q8H PRN   oxyCODONE-acetaminophen 1 tablet Q4H PRN   senna-docusate 2 tablet BID PRN   zolpidem 5 mg QHS PRN         I personally reviewed all of the medications      Review of Systems:   A comprehensive review of systems was obtained from chart review and the patient  General ROS: negative  Ophthalmic ROS: negative  Endocrine ROS: negative  Respiratory ROS: no cough, denies shortness of breath  Cardiovascular ROS: no chest pain or some dyspnea on exertion  Gastrointestinal ROS: no abdominal pain. No nausea/vomiting, +BMs   Genito-Urinary ROS: no c/o currently  Musculoskeletal ROS: right knee pain- managed with pain  meds  Neurological ROS: no TIA or stroke symptoms, denies dizziness      Physical Exam:     Filed Vitals:    09/30/13 0940 09/30/13 1641 09/30/13 2032 10/01/13 0545   BP: 125/60 141/65 161/69 152/67   Pulse: 68 65 72 63   Temp: 96.4 F (35.8 C) 97 F (36.1 C) 96.4 F (35.8 C) 97.9 F (36.6 C)   TempSrc:   Oral Oral   Resp: 18 20 18 18    Height:       Weight:       SpO2: 93% 99% 95% 97%         Intake and Output Summary (Last 24 hours) at Date Time    Intake/Output Summary (Last 24 hours) at 10/01/13 0701  Last data filed at 10/01/13 0600   Gross per 24 hour   Intake    840 ml   Output      0 ml   Net    840  ml       General appearance - awake, well appearing, and in no distress, c/o pain- just received meds  Mental status - alert, oriented to person, place, and time  Chest -Lungs clear, no rales or rhonchi, symmetric air entry, no wheezing  Heart - normal rate, regular rhythm, normal S1, S2, no murmurs  Abdomen - bowel sounds normal, abdomen is soft, nontender, non distended, obese  Neurological - alert, oriented, normal speech, no focal findings or movement disorder noted  Musculoskeletal - laying on bed   Extremities - peripheral pulses normal, no pedal edema, no clubbing or cyanosis  RLE w/dressing      Labs:       Lab 09/29/13 0523 09/25/13 0619   GLU 90 105*   BUN 9 9   CREAT 0.6 0.7   CA 8.4 8.4   NA 141 140   K 3.2* 3.6   CL 102 103   CO2 29 28   ALB 2.2* --   PHOS -- --   MG -- --   EGFR >60.0 >60.0   PT -- --   PTT -- --   INR -- --         Lab 09/29/13 0523   AST 17   ALT <6   ALKPHOS 77   ALB 2.2*   BILITOTAL 0.5   AMY --   LIP --         Lab 09/29/13 0523 09/25/13 0619   WBC 7.41 12.73*   HGB 9.6* 10.1*   HCT 29.7* 30.5*   MCV 85.1 84.5   MCH 27.5* 28.0   MCHC 32.3 33.1   RDW 15 15   MPV 9.5 9.9       No results found for this basename: TSH,FREET3,FREET4 in the last 168 hours        Rads:     Radiology Results (24 Hour)     ** No Results found for the last 24 hours. **                  Assessment and  Plan:      Patient Active Problem List   Diagnosis   . S/p R TKA, readmitted 09/05/13. S/p R TKA I&D and Poly Exchange, s/p Revision. S/p gastrocnemius muscle flap and skin graft- continue plan per  Ortho, ID, and Plastic Surgery.     Poor wound healing/Increased nutritional need- on Boost Breeze and Juven for supplementation   . Low back pain - controlled   . Chronic obstructive pulmonary disease- Continue spriva. Continue albuterol nebs prn- has not used recently. Currently stable. Continue to encourage IS use.    Marland Kitchen Hypertension- BP improved. Continue meds    Post-operative Acute Anemia, s/p surgery. Continue Fe/Vit C. Pt s/p transfusions. Currently asymptomatic.     Post-operative pain- treat PRN per Ortho  Pt  Is more positive today. Family continues to be very supportive.    Signed by: Sheryle Hail, NP

## 2013-10-01 NOTE — Progress Notes (Signed)
PROGRESS NOTE    Date Time: 10/01/2013 8:58 AM  Patient Name: [name], [name]      Assessment:    Right TKA Irrigation and Debridement and Poly Exchange 09/05/13, 12/8 resection and antibiotic spacer, 12/15 antibiotic spacer exchange and plastic surgery coverage.   Plan:    PT - Non weight bearing in Extension with brace on, No bending knee, may get OOB to chair. NWB RLE NO knee ROM   DVT: ASA 325 bid   ID: Pan-Sensitive Proteus ssp Final Recommendations Cipro 750 bid for 6 weeks, on Ancef for now per ID   D/c planning pending- soft tissue healing   Appreciate plastics input re: gastroc flap      Subjective:   Pain controlled, tolerating diet, participating in PT/OT    Medications:     Current Facility-Administered Medications   Medication Dose Route Frequency   . amLODIPine  10 mg Oral Daily   . bisoprolol  5 mg Oral Daily   . celecoxib  200 mg Oral Daily   . ciprofloxacin  750 mg Oral Q12H SCH   . cloNIDine  0.1 mg Oral Daily   . docusate sodium  100 mg Oral Q12H SCH   . ferrous sulfate 324 mg-ascorbic acid 500 mg combo dose   Oral Daily   . gabapentin  300 mg Oral BID   . hydrochlorothiazide  25 mg Oral Daily   . oxyCODONE  10 mg Oral Q12H SCH   . polycarbophil  1,250 mg Oral Daily   . polyethylene glycol  17 g Oral Daily   . potassium chloride  10 mEq Oral Daily   . Senna  17.2 mg Oral QHS   . tiotropium  18 mcg Inhalation QAM   . traMADol  50 mg Oral Q6H SCH       Physical Exam:     Filed Vitals:    10/01/13 0545   BP: 152/67   Pulse: 63   Temp: 97.9 F (36.6 C)   Resp: 18   SpO2: 97%       Intake and Output Summary (Last 24 hours) at Date Time    Intake/Output Summary (Last 24 hours) at 10/01/13 0858  Last data filed at 10/01/13 0600   Gross per 24 hour   Intake    840 ml   Output      0 ml   Net    840 ml       General appearance - alert and oriented, in no acute distress  Dressing c/d/i  Drains intact  Fires TA/EHL/GSC  SILT SP/DP/TN  2+ DP pulse  Calves soft, nontender bilateral    Labs:     Recent CBC   No  results found for this basename: WHITEBLOODCE,RBC,HGB,HCT,,LABPLAT, NRBCA, REFLX, ANRBA in the last 24 hours  Recent BMP   No results found for this basename: GLU,BUN,CREAT,CA,NA,K,CL,CO2,AGAP in the last 24 hours  Recent PT/INR No results found for this basename: PTI,INR,COUM,COUMP in the last 24 hours      Signed by: Garey Ham

## 2013-10-01 NOTE — Progress Notes (Addendum)
Above reviewed. Going to acute rehab. Tolerating Cipro. She should continue the Cipro through Jan 19th See previous notes. Will f/u next week and get labs on Monday. If discharged, she should have labs next week: CBC, CMP, CRP, ESR and also the second week of January and fax results to [phone]. D/W Harriett Sine.

## 2013-10-01 NOTE — Plan of Care (Signed)
Problem: Pain  Goal: Patient's pain/discomfort is manageable  Outcome: Progressing  Pain well-controlled with current pain regimen,NWB on RLE.Safety maintained.Continue with plan of care.

## 2013-10-01 NOTE — Plan of Care (Signed)
Problem: Health Promotion  Goal: Knowledge - health resources  Extent of understanding and conveyed about healthcare resources.   Intervention: Discharge planning  Faxed clinical info to [facility] for possible transfer to SNF on 12/26

## 2013-10-01 NOTE — PT Progress Note (Signed)
Physical Therapy Treatment    Patient:  [name]        MRN#:  [id number]  Unit:  [facility] 4[facility]        Room/Bed:  M442/M442.01    Time of treatment: Start Time: 1130 Stop Time: 1200  Time Calculation (min): 30 min  PT Received On: 10/01/13    Treatment #: PT Visit Number: 6/10    Precautions  Weight Bearing Status: RLE partial weight bearing  Weight Bearing Percent: 10   Precaution Instructions Given to Patient: Yes    Medical Diagnosis:   Infection of total knee replacement, initial encounter [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      Patient's medical condition is appropriate for Physical Therapy  intervention at this time.  Patient cleared by nurse to participate in PT session, nursing reports awaiting insurance ok for next step..    Subjective: I am trying my best.   I am doing what everyone is telling me to do.  Patient is agreeable to participation in the therapy session.     Pain Assessment  Pain Assessment: No/denies pain          Objective:  Patient is in bed with all needs provided. seen 9 Days Post-Op with dressings.                                       Sensation is intact              Functional Mobility  Supine to Sit: Stand by assistance  Sit to Stand: Stand by assistance  Stand to Sit: Stand by assistance         Locomotion  Ambulation: minimal assistance;with front-wheeled walker  Ambulation Distance (Feet): 5 Feet (is all patient could ambulate and maintain 10% WB)  Pattern: R decreased stance time;decreased step length;decreased cadence;Step to    Therapeutic Exercise  Quad Sets: 15  Glute Sets: 15  Ankle Pumps: 15       Self Care and Home Management:  Pt did not perform self care  activities at this time.    Educated the patient to role of physical therapy, plan of care, goals  of therapy and safety with mobility and ADLs, energy conservation techniques, weight bearing precautions, home safety.    Patient is seated in wheelchair at nursing station with all needs provided and call bell within reach. RN notified of session outcome, including Increased ambulation to 5 ft. Maintaining 10% WB.Marland Kitchen    Assessment:   Assessment  Assessment: Decreased LE strength;Decreased LE ROM;Decreased endurance/activity tolerance;Decreased functional mobility;Gait impairment  Prognosis: Good;With continued PT status post acute discharge.           Goals per Eval/Re-eval:     Goals  Goal Formulation: With patient;With patient/family  Time for Goal Acheivement: 7 visits  Goals: Select goal  Pt Will Go Supine To Sit: With minimal assist;Goal met 10/01/13  Pt Will Perform Sit to Stand: With minimal assist;Goal met12/24/14  Pt Will Transfer to Toilet: With minimal assist  Pt Will Transfer to Tub/Shower: With minimal assist  PT Will Demonstrate Car Transfer Technique: With minimal assist  Pt Will Ambulate: 1-10 feet;with rolling walker;With minimal assist;Goal met (10% RLE WB)10/01/13  Pt Will Go Up / Down Stairs: Discontinued (comment)  Pt Will Perform Home Exer Program: With minimal assist  Pt Will Perform LE Dressing w/Device: With min assist  Other Goal:  (New goal: NWB)          Plan:  Recommendation  Discharge Recommendation: Acute Rehab  DME Recommended for Discharge:  (leg loop lifter)  PT - Next Visit Recommendation: 10/02/13  PT Frequency: 7x/wk    Continue plan of care.      Signature: Louann Liv, PTA 10/01/2013 12:08 PM

## 2013-10-01 NOTE — Plan of Care (Signed)
Problem: Health Promotion  Goal: Knowledge - health resources  Extent of understanding and conveyed about healthcare resources.   Intervention: Discharge planning  Received  request from elena at [facility] for clinical update due on 12/22. Update was sent on 12/22. Phone call to elena, left message to see how many days have been approved. Also requested assist with auth for acute rehab here at [facility]. (request has been sent previously by [facility] at acute rehab). Await auth for acute rehab and auth for inpt stay.  Met with pt and daughter, reviewed [facility] options; 1) acute rehab at [facility] when stay approved, 2) SNF on 12/26, or 3) home with family, when clears PT.  Pt and dau agreeable

## 2013-10-01 NOTE — Plan of Care (Signed)
Problem: Health Promotion  Goal: Knowledge - health resources  Extent of understanding and conveyed about healthcare resources.   Intervention: Discharge planning  Call from [facility], all days approved so far

## 2013-10-02 NOTE — Progress Notes (Signed)
PROGRESS NOTE    Date Time: 10/02/2013 8:06 AM  Patient Name: [name], [name]      Assessment:    Right TKA Irrigation and Debridement and Poly Exchange 09/05/13, 12/8 resection and antibiotic spacer, 12/15 antibiotic spacer exchange and plastic surgery coverage.   Plan:    PT - Non weight bearing in Extension with brace at all times, No knee ROM   DVT: ASA 325 bid   ID: Pan-Sensitive Proteus ssp Final Recommendations Cipro 750 bid for 6 weeks, on Ancef for now per ID   D/c planning pending- soft tissue healing   Appreciate plastics input re: gastroc flap      Subjective:   Pain controlled, tolerating diet, comfortable this AM    Medications:     Current Facility-Administered Medications   Medication Dose Route Frequency   . amLODIPine  10 mg Oral Daily   . bisoprolol  5 mg Oral Daily   . celecoxib  200 mg Oral Daily   . ciprofloxacin  750 mg Oral Q12H SCH   . cloNIDine  0.1 mg Oral Daily   . docusate sodium  100 mg Oral Q12H SCH   . ferrous sulfate 324 mg-ascorbic acid 500 mg combo dose   Oral Daily   . gabapentin  300 mg Oral BID   . hydrochlorothiazide  25 mg Oral Daily   . oxyCODONE  10 mg Oral Q12H SCH   . polycarbophil  1,250 mg Oral Daily   . polyethylene glycol  17 g Oral Daily   . potassium chloride  10 mEq Oral Daily   . Senna  17.2 mg Oral QHS   . tiotropium  18 mcg Inhalation QAM   . traMADol  50 mg Oral Q6H SCH       Physical Exam:     Filed Vitals:    10/02/13 0424   BP: 141/64   Pulse: 63   Temp: 96.8 F (36 C)   Resp: 18   SpO2: 95%       Intake and Output Summary (Last 24 hours) at Date Time  No intake or output data in the 24 hours ending 10/02/13 0806    General appearance - alert and oriented, in no acute distress  Dressing c/d/i  Fires TA/EHL/GSC  SILT SP/DP/TN  2+ DP pulse  Calves soft, nontender bilateral    Labs:     Recent CBC   No results found for this basename: WHITEBLOODCE,RBC,HGB,HCT,,LABPLAT, NRBCA, REFLX, ANRBA in the last 24 hours  Recent BMP   No results found for this basename:  GLU,BUN,CREAT,CA,NA,K,CL,CO2,AGAP in the last 24 hours  Recent PT/INR No results found for this basename: PTI,INR,COUM,COUMP in the last 24 hours      Signed by: Marrian Salvage

## 2013-10-02 NOTE — OT Progress Note (Signed)
[facility]  [address]  [place] [address]  [phone]    Occupational Therapy Treatment    Patient: [name]    MRN#: [id number]  Unit: [facility] 4C         Bed: A213/Y865.78    Medical Diagnosis: Infection of total knee replacement, initial encounter [996.66, V43.65] (INFECTED RT KNEE)  Total knee replacement status [V43.65] (Total knee replacement status)    Time of treatment:    Time Calculation  OT Received On: 10/02/13  Start Time: 1235  Stop Time: 1315  Time Calculation (min): 40 min    Treatment #  OT Visit Number: 4/5    Precautions and Contraindications:  Weight Bearing Status: RLE partial weight bearing   Weight Bearing Percent: 10   Total Knee Replacement: at all times (knee straight, no ROM at knee)   Order 09/30/13 10% WB in extension. NO bending knee.    Subjective: "I'd like to try and put my pants on today. I'm doing well."   Patient's medical condition is appropriate for Occupational Therapy  intervention at this time.  Patient is agreeable to participation in the therapy session. Nursing clears patient for therapy.  Pain: 3/10  Location: RLE  Therapist Intervention: RN aware. OT assisted with positioning.   Patient is satisfied with therapist intervention.    Objective:  Patient is seated in wheelchair by bed with Intravenous Access and Dressing  in place.     Cognition:  WFL    Self Care:  Feeding: NT  Grooming: supervision; seated in wheelchair  Bathing: Mod A (without use of equipment)   UB Dressing: supervision; seated in wheelchair   LB Dressing: Min A (required for standing balance. Pt able to don/doff socks and thread pants on/off using reacher and sock aid with supervision seated in chair)  Toileting: NT    Functional Mobility:  Rolling: NT  Scooting: independent in wheelchair  Supine to Sit: NT  Sit to Supine: NT  Sit to Stand: Min A  Transfers: Min A to ambulate 2 steps using FWW     B UE Exercises: (using medium resistance  Thera-band seated in wheelchair with cueing):   Shoulder Flexion: 2 X 10   Shoulder Abduction: 2 X 10   Elbow Flexion: 2 X 10   Elbow Extenison: 2 X 10   Internal Rotation/External Rotation: 2 X 10     Treatment Activities: ADLs, functional mobility, BUE exercises     Observation of Patient:  Vitals:     Filed Vitals:    10/01/13 0545 10/01/13 1508 10/01/13 1950 10/02/13 0424   BP: 152/67 143/65 142/67 141/64   Pulse: 63 57 67 63   Temp: 97.9 F (36.6 C) 96.4 F (35.8 C) 96.1 F (35.6 C) 96.8 F (36 C)   TempSrc: Oral  Oral    Resp: 18 18 18 18    Height:       Weight:       SpO2: 97%  96% 95%       Education:   Educated the patient to role of occupational therapy, plan of care,  goals of therapy and HEP, safety with mobility and ADLs, weight bearing precautions, LE ADL equipment and techniques.    Patient left seated in wheelchair by bed with all needs met, equipment intact and call bell within reach. RN notified of session outcome.  Updated status posted at bedside.    Assessment:  Pt continues to demonstrate improvement  with activity tolerance, standing balance, and ability to complete her LE ADLs.  Pt's ability to complete ADLs and functional transfers continues to be impaired due to the following deficits: Pain, activity tolerance, standing balance, NWB status to RLE (pt needs cueing to [place] during mobility), and safety awareness. Pt would continue to benefit from OT to address these deficits and increase independence with ADLs and functional transfers. Pt is very motivated to participate with therapy.     Goals:  Time For Goal Achievement: 5 visits  ADL Goals  Patient will groom self:  (standing at sink with CGA)  Patient will dress lower body: with AE;with supervision (goal updated)  Pt will complete bathing: With stand by assist (goal updated)  Patient will toilet: with supervision (goal updated)  Mobility and Transfer Goals  Pt will transfer bed to [facility]:  (with CGA)    Executive Fucntion Goals  Other  Goal: Pt will independenlty maintain NWB status 100% of the time during ADLs and functional mobility    OT Plan  Treatment Interventions: ADL retraining;Functional transfer training;UE strengthening/ROM;Endurance training;Patient/Family training;Equipment eval/education;Compensatory technique education;Continued evaluation  Discharge Recommendation: Acute Rehab  DME Recommended for Discharge:  (defer to next level of rehab)  OT Frequency Recommended: 4-5x/wk    Continue plan of care.    Signature:   Louie Boston, OT  10/02/2013 1:35 PM   Phone: [phone]

## 2013-10-02 NOTE — PT Progress Note (Signed)
Physical Therapy Treatment    Patient:  [name]        MRN#:  [id number]  Unit:  [place] [facility] 4[facility]        Room/Bed:  M442/M442.01    Time of treatment: Start Time: 1030 Stop Time: 1115  Time Calculation (min): 45 min  PT Received On: 10/02/13    Treatment #: PT Visit Number: 1/10    Precautions  Weight Bearing Status: RLE partial weight bearing  Weight Bearing Percent: 10   Total Knee Replacement: at all times (knee straight, no ROM at knee)  Precaution Instructions Given to Patient: Yes  Order 09/30/13 10% WB in extension. NO bending knee.        Medical Diagnosis:   Infection of total knee replacement, initial encounter [996.66, V43.65] (INFECTED RT KNEE)  Total knee replacement status [V43.65] (Total knee replacement status)  Surgical Procedure:Right   Procedure(s):  ARTHROPLASTY, KNEE, REVISION TOTAL  DEBRIDEMENT, MUSCLE FLAP CLOSURE  GRAFT, SKIN  SPLIT THICKNESS, (MEDICAL) performed by r. Bhatt12/15/2014.      Patient's medical condition is appropriate for Physical Therapy  intervention at this time.  Patient cleared by nurse to participate in PT session.    Subjective: Pt states she is feeling better today and wants to be as independent as possible.  Patient is agreeable to participation in the therapy session. Family and/or guardian are agreeable to patient's participation in the therapy session. Nursing clears patient for therapy.     Pain Assessment  Pain Assessment: Numeric Scale (0-10)  Pain Score: 2-mild pain  POSS Score: Awake and Alert  Pain Location: Knee  Pain Intervention(s): Ambulation/increased activity          Objective:  Patient is in bed seen 10 Days Post-Op with dressings.    APs, QD, GS and Hip abd x 20           Functional Mobility  Supine to Sit:  (cga)  Scooting to EOB: Stand by assistance  Sit to Stand: Min assist (cg)  Stand to Sit: Min assist         Locomotion  Ambulation: minimal assistance;with front-wheeled walker  Ambulation Distance (Feet): 8  Feet  Pattern: Step to (10% WB)  Wheelchair Mobility: minimal assistance (~40 ft straight,  including a turn)            Self Care and Home Management:  Pt did not perform self care activities at this time.    Educated the patient to role of physical therapy, plan of care, goals  of therapy and HEP, safety with mobility and ADLs, weight bearing precautions.    Patient is seated in w/c in room with son present  with all needs provided and call bell within reach. RN notified of session outcome.    Assessment:   Assessment  Pt continues to be motivated and requiring slightly less assist.  Added w/c goal to work towards more functional independence for patient.  Goals also revisited this session due to patient's progression and now patient also being followed by OT.    Assessment: Decreased LE ROM;Decreased LE strength;Decreased endurance/activity tolerance;Decreased functional mobility;Decreased balance;Gait impairment  Prognosis: Good;With continued PT status post acute discharge.           Goals per Eval/Re-eval:     Goals  Goal Formulation: With patient;With patient/family  Time for Goal Acheivement: 7 visits  Goals: Select goal  Pt Will Go Supine To Sit: New goal;With stand by assist  Pt Will Perform Sit to Stand: New goal;With stand by assist  Pt Will Transfer to Toilet: Discontinued (comment) (OT to address)  Pt Will Transfer to Tub/Shower: With minimal assist  PT Will Demonstrate Car Transfer Technique: With minimal assist  Pt Will Ambulate: 1-10 feet;with rolling walker;With minimal assist;Goal met  Pt Will Go Up / Down Stairs: Discontinued (comment)  Pt Will Perform Home Exer Program: With minimal assist  Pt Will Propel Wheelchair: 51-150 feet;With stand by assist  Pt Will Perform LE Dressing w/Device: Discontinued (comment) (OT to address)  Other Goal:  (New goal: NWB)          Plan:  Recommendation  Discharge Recommendation: Acute Rehab  DME Recommended for Discharge:  (leg loop lifter)  PT - Next Visit  Recommendation: 10/03/13  PT Frequency: 7x/wk    Continue plan of care.      Signature: Erasmo Downer, PT 10/02/2013 1:25 PM           }

## 2013-10-03 ENCOUNTER — Inpatient Hospital Stay
Admission: RE | Admit: 2013-10-03 | Discharge: 2013-10-14 | DRG: 945 | Disposition: A | Discharge status: Skilled Nursing Facility | Payer: No Typology Code available for payment source | Source: Other Acute Inpatient Hospital | Attending: Physical Medicine & Rehabilitation | Admitting: Physical Medicine & Rehabilitation

## 2013-10-03 ENCOUNTER — Inpatient Hospital Stay: Payer: No Typology Code available for payment source | Admitting: Physical Medicine & Rehabilitation

## 2013-10-03 DIAGNOSIS — K5900 Constipation, unspecified: Secondary | ICD-10-CM | POA: Diagnosis present

## 2013-10-03 DIAGNOSIS — M545 Low back pain, unspecified: Secondary | ICD-10-CM | POA: Diagnosis present

## 2013-10-03 DIAGNOSIS — I10 Essential (primary) hypertension: Secondary | ICD-10-CM | POA: Diagnosis present

## 2013-10-03 DIAGNOSIS — R269 Unspecified abnormalities of gait and mobility: Secondary | ICD-10-CM | POA: Diagnosis present

## 2013-10-03 DIAGNOSIS — Z5189 Encounter for other specified aftercare: Secondary | ICD-10-CM

## 2013-10-03 DIAGNOSIS — Z96659 Presence of unspecified artificial knee joint: Secondary | ICD-10-CM

## 2013-10-03 DIAGNOSIS — A498 Other bacterial infections of unspecified site: Secondary | ICD-10-CM | POA: Diagnosis present

## 2013-10-03 DIAGNOSIS — E46 Unspecified protein-calorie malnutrition: Secondary | ICD-10-CM | POA: Diagnosis present

## 2013-10-03 DIAGNOSIS — G4700 Insomnia, unspecified: Secondary | ICD-10-CM | POA: Diagnosis present

## 2013-10-03 DIAGNOSIS — R5381 Other malaise: Secondary | ICD-10-CM | POA: Diagnosis present

## 2013-10-03 DIAGNOSIS — N390 Urinary tract infection, site not specified: Secondary | ICD-10-CM | POA: Diagnosis present

## 2013-10-03 DIAGNOSIS — Z6841 Body Mass Index (BMI) 40.0 and over, adult: Secondary | ICD-10-CM

## 2013-10-03 DIAGNOSIS — J4489 Other specified chronic obstructive pulmonary disease: Secondary | ICD-10-CM | POA: Diagnosis present

## 2013-10-03 DIAGNOSIS — M129 Arthropathy, unspecified: Secondary | ICD-10-CM | POA: Diagnosis present

## 2013-10-03 DIAGNOSIS — D649 Anemia, unspecified: Secondary | ICD-10-CM | POA: Diagnosis present

## 2013-10-03 LAB — URINALYSIS WITH MICROSCOPIC
Bilirubin, UA: NEGATIVE
Blood, UA: NEGATIVE
Glucose, UA: NEGATIVE
Ketones UA: NEGATIVE
Nitrite, UA: NEGATIVE
Protein, UR: NEGATIVE
Specific Gravity UA: 1.014 (ref 1.001–1.035)
Urine pH: 7 (ref 5.0–8.0)
Urobilinogen, UA: 4 mg/dL — AB (ref 0.2–2.0)

## 2013-10-03 MED ORDER — CIPROFLOXACIN HCL 250 MG PO TABS
750.0000 mg | ORAL_TABLET | Freq: Two times a day (BID) | ORAL | Status: DC
Start: 2013-10-03 — End: 2013-10-13
  Administered 2013-10-04 – 2013-10-13 (×20): 750 mg via ORAL
  Filled 2013-10-03 (×22): qty 3

## 2013-10-03 MED ORDER — SENNOSIDES-DOCUSATE SODIUM 8.6-50 MG PO TABS
2.0000 | ORAL_TABLET | Freq: Two times a day (BID) | ORAL | Status: DC | PRN
Start: 2013-10-03 — End: 2013-10-14

## 2013-10-03 MED ORDER — DIPHENHYDRAMINE HCL 25 MG PO CAPS
25.0000 mg | ORAL_CAPSULE | Freq: Four times a day (QID) | ORAL | Status: DC | PRN
Start: 2013-10-03 — End: 2013-10-14

## 2013-10-03 MED ORDER — POTASSIUM CHLORIDE CRYS ER 10 MEQ PO TBCR
10.0000 meq | EXTENDED_RELEASE_TABLET | Freq: Every day | ORAL | Status: DC
Start: 2013-10-04 — End: 2013-10-14
  Administered 2013-10-04 – 2013-10-14 (×11): 10 meq via ORAL
  Filled 2013-10-03 (×11): qty 1

## 2013-10-03 MED ORDER — CELECOXIB 100 MG PO CAPS
200.0000 mg | ORAL_CAPSULE | Freq: Every day | ORAL | Status: DC
Start: 2013-10-04 — End: 2013-10-07
  Administered 2013-10-04 – 2013-10-07 (×4): 200 mg via ORAL
  Filled 2013-10-03 (×4): qty 2

## 2013-10-03 MED ORDER — OXYCODONE HCL ER 10 MG PO T12A
10.0000 mg | EXTENDED_RELEASE_TABLET | Freq: Two times a day (BID) | ORAL | Status: DC | PRN
Start: 2013-10-03 — End: 2013-10-14
  Administered 2013-10-04 – 2013-10-13 (×7): 10 mg via ORAL
  Filled 2013-10-03 (×8): qty 1

## 2013-10-03 MED ORDER — NALOXONE HCL 0.4 MG/ML IJ SOLN
0.1000 mg | INTRAMUSCULAR | Status: DC | PRN
Start: 2013-10-03 — End: 2013-10-14

## 2013-10-03 MED ORDER — SENNA 8.6 MG PO TABS
17.2000 mg | ORAL_TABLET | Freq: Every evening | ORAL | Status: DC
Start: 2013-10-03 — End: 2013-10-14
  Administered 2013-10-04 – 2013-10-13 (×8): 17.2 mg via ORAL
  Filled 2013-10-03 (×11): qty 2

## 2013-10-03 MED ORDER — TRAMADOL HCL 50 MG PO TABS
50.0000 mg | ORAL_TABLET | Freq: Four times a day (QID) | ORAL | Status: DC
Start: 2013-10-04 — End: 2013-10-14
  Administered 2013-10-04 – 2013-10-14 (×33): 50 mg via ORAL
  Filled 2013-10-03 (×33): qty 1

## 2013-10-03 MED ORDER — ZOLPIDEM TARTRATE 5 MG PO TABS
5.0000 mg | ORAL_TABLET | Freq: Every evening | ORAL | Status: DC | PRN
Start: 2013-10-03 — End: 2013-10-14

## 2013-10-03 MED ORDER — CALCIUM POLYCARBOPHIL 625 MG PO TABS
1250.0000 mg | ORAL_TABLET | Freq: Every day | ORAL | Status: DC
Start: 2013-10-04 — End: 2013-10-14
  Administered 2013-10-04 – 2013-10-14 (×11): 1250 mg via ORAL
  Filled 2013-10-03 (×12): qty 2

## 2013-10-03 MED ORDER — CLONIDINE HCL 0.1 MG PO TABS
0.1000 mg | ORAL_TABLET | Freq: Every day | ORAL | Status: DC
Start: 2013-10-04 — End: 2013-10-14
  Administered 2013-10-04 – 2013-10-14 (×11): 0.1 mg via ORAL
  Filled 2013-10-03 (×11): qty 1

## 2013-10-03 MED ORDER — HYDROCODONE-ACETAMINOPHEN 5-325 MG PO TABS
1.0000 | ORAL_TABLET | ORAL | Status: DC | PRN
Start: 2013-10-03 — End: 2013-10-14
  Administered 2013-10-04 – 2013-10-14 (×9): 1 via ORAL
  Filled 2013-10-03 (×9): qty 1

## 2013-10-03 MED ORDER — FERROUS SULFATE 324 (65 FE) MG PO TBEC
DELAYED_RELEASE_TABLET | Freq: Every day | ORAL | Status: DC
Start: 2013-10-04 — End: 2013-10-14
  Filled 2013-10-03 (×22): qty 1

## 2013-10-03 MED ORDER — ONDANSETRON HCL 8 MG PO TABS
4.0000 mg | ORAL_TABLET | Freq: Three times a day (TID) | ORAL | Status: DC | PRN
Start: 2013-10-03 — End: 2013-10-14
  Administered 2013-10-04 – 2013-10-12 (×7): 4 mg via ORAL
  Filled 2013-10-03 (×7): qty 1

## 2013-10-03 MED ORDER — ALBUTEROL SULFATE (2.5 MG/3ML) 0.083% IN NEBU
2.5000 mg | INHALATION_SOLUTION | RESPIRATORY_TRACT | Status: DC | PRN
Start: 2013-10-03 — End: 2013-10-14

## 2013-10-03 MED ORDER — BISACODYL 5 MG PO TBEC
10.0000 mg | DELAYED_RELEASE_TABLET | Freq: Every day | ORAL | Status: DC | PRN
Start: 2013-10-03 — End: 2013-10-14
  Administered 2013-10-14: 10 mg via ORAL
  Filled 2013-10-03: qty 2

## 2013-10-03 MED ORDER — POLYETHYLENE GLYCOL 3350 17 G PO PACK
17.0000 g | PACK | Freq: Every day | ORAL | Status: DC
Start: 2013-10-04 — End: 2013-10-07
  Filled 2013-10-03 (×4): qty 1

## 2013-10-03 MED ORDER — HYDROCODONE-ACETAMINOPHEN 5-325 MG PO TABS
2.0000 | ORAL_TABLET | Freq: Four times a day (QID) | ORAL | Status: DC | PRN
Start: 2013-10-03 — End: 2013-10-14
  Administered 2013-10-06: 2 via ORAL
  Filled 2013-10-03: qty 2

## 2013-10-03 MED ORDER — HYDROXYZINE PAMOATE 25 MG PO CAPS
25.0000 mg | ORAL_CAPSULE | Freq: Two times a day (BID) | ORAL | Status: DC | PRN
Start: 2013-10-03 — End: 2013-10-14
  Filled 2013-10-03 (×2): qty 1

## 2013-10-03 MED ORDER — HYDROCHLOROTHIAZIDE 25 MG PO TABS
25.0000 mg | ORAL_TABLET | Freq: Every day | ORAL | Status: DC
Start: 2013-10-04 — End: 2013-10-14
  Administered 2013-10-04 – 2013-10-14 (×11): 25 mg via ORAL
  Filled 2013-10-03 (×11): qty 1

## 2013-10-03 MED ORDER — GABAPENTIN 300 MG PO CAPS
300.0000 mg | ORAL_CAPSULE | Freq: Two times a day (BID) | ORAL | Status: DC
Start: 2013-10-03 — End: 2013-10-14
  Administered 2013-10-04 – 2013-10-14 (×22): 300 mg via ORAL
  Filled 2013-10-03 (×23): qty 1

## 2013-10-03 MED ORDER — TIOTROPIUM BROMIDE MONOHYDRATE 18 MCG IN CAPS
18.0000 ug | ORAL_CAPSULE | Freq: Every morning | RESPIRATORY_TRACT | Status: DC
Start: 2013-10-04 — End: 2013-10-14
  Administered 2013-10-04 – 2013-10-14 (×8): 18 ug via RESPIRATORY_TRACT
  Filled 2013-10-03 (×2): qty 5

## 2013-10-03 MED ORDER — DOCUSATE SODIUM 100 MG PO CAPS
100.0000 mg | ORAL_CAPSULE | Freq: Two times a day (BID) | ORAL | Status: DC
Start: 2013-10-03 — End: 2013-10-14
  Administered 2013-10-04 – 2013-10-14 (×21): 100 mg via ORAL
  Filled 2013-10-03 (×22): qty 1

## 2013-10-03 MED ORDER — AMLODIPINE BESYLATE 5 MG PO TABS
10.0000 mg | ORAL_TABLET | Freq: Every day | ORAL | Status: DC
Start: 2013-10-04 — End: 2013-10-14
  Administered 2013-10-04 – 2013-10-14 (×11): 10 mg via ORAL
  Filled 2013-10-03 (×11): qty 2

## 2013-10-03 MED ORDER — BISOPROLOL FUMARATE 5 MG PO TABS
5.0000 mg | ORAL_TABLET | Freq: Every day | ORAL | Status: DC
Start: 2013-10-04 — End: 2013-10-14
  Administered 2013-10-04 – 2013-10-14 (×11): 5 mg via ORAL
  Filled 2013-10-03 (×11): qty 1

## 2013-10-03 NOTE — Progress Notes (Addendum)
[facility] PROGRESS NOTE    Date Time: 10/03/2013 9:26 AM  Patient Name: [name]        Subjective:   Feels ok , pain not controlled at times         Medications:      Scheduled Meds: PRN Meds:           amLODIPine 10 mg Oral Daily   bisoprolol 5 mg Oral Daily   celecoxib 200 mg Oral Daily   ciprofloxacin 750 mg Oral Q12H SCH   cloNIDine 0.1 mg Oral Daily   docusate sodium 100 mg Oral Q12H SCH   ferrous sulfate 324 mg-ascorbic acid 500 mg combo dose  Oral Daily   gabapentin 300 mg Oral BID   hydrochlorothiazide 25 mg Oral Daily   oxyCODONE 10 mg Oral Q12H SCH   polycarbophil 1,250 mg Oral Daily   polyethylene glycol 17 g Oral Daily   potassium chloride 10 mEq Oral Daily   Senna 17.2 mg Oral QHS   tiotropium 18 mcg Inhalation QAM   traMADol 50 mg Oral Q6H SCH         Continuous Infusions:         albuterol 2.5 mg Q4H PRN   bisacodyl 10 mg QD PRN   diphenhydrAMINE 25 mg Q6H PRN   HYDROcodone-acetaminophen 1 tablet Q4H PRN   HYDROcodone-acetaminophen 2 tablet Q6H PRN   HYDROcodone-acetaminophen 2 tablet Q4H PRN   HYDROmorphone 0.5 mg Q1H PRN   hydrOXYzine 25 mg Q12H PRN   naloxone 0.1 mg PRN   ondansetron 4 mg Q8H PRN   ondansetron 4 mg Q8H PRN   oxyCODONE-acetaminophen 1 tablet Q4H PRN   senna-docusate 2 tablet BID PRN   zolpidem 5 mg QHS PRN         I personally reviewed all of the medications      Review of Systems:   A comprehensive review of systems was obtained from chart review and the patient  General ROS: negative  Ophthalmic ROS: negative  Endocrine ROS: negative  Respiratory ROS: no cough, denies shortness of breath  Cardiovascular ROS: no chest pain or some dyspnea on exertion  Gastrointestinal ROS: no abdominal pain. No nausea/vomiting, +BMs   Genito-Urinary ROS: no c/o currently  Musculoskeletal ROS: right knee pain- managed with pain meds  Neurological ROS: no TIA or stroke symptoms, denies dizziness      Physical Exam:     Filed Vitals:    10/02/13 1515 10/02/13 2135 10/03/13 0454  10/03/13 0900   BP: 138/60 149/64 142/63 109/59   Pulse: 62 62 60 65   Temp: 94.6 F (34.8 C) 96.4 F (35.8 C) 98.1 F (36.7 C) 98.1 F (36.7 C)   TempSrc:   Oral    Resp: 19 18 18 16    Height:       Weight:       SpO2:  96% 95% 95%         Intake and Output Summary (Last 24 hours) at Date Time  No intake or output data in the 24 hours ending 10/03/13 0926    General appearance - awake, well appearing, and in no distress, c/o pain- just received meds  Mental status - alert, oriented to person, place, and time  Chest -Lungs clear, no rales or rhonchi, symmetric air entry, no wheezing  Heart - normal rate, regular rhythm, normal S1, S2, no murmurs  Abdomen - bowel sounds normal, abdomen is soft, nontender, non distended, obese  Neurological - alert, oriented, normal speech, no focal findings or movement disorder noted  Musculoskeletal - laying on bed   Extremities - peripheral pulses normal, no pedal edema, no clubbing or cyanosis  RLE w/dressing      Labs:       Lab 09/29/13 0523   GLU 90   BUN 9   CREAT 0.6   CA 8.4   NA 141   K 3.2*   CL 102   CO2 29   ALB 2.2*   PHOS --   MG --   EGFR >60.0   PT --   PTT --   INR --         Lab 09/29/13 0523   AST 17   ALT <6   ALKPHOS 77   ALB 2.2*   BILITOTAL 0.5   AMY --   LIP --         Lab 09/29/13 0523   WBC 7.41   HGB 9.6*   HCT 29.7*   MCV 85.1   MCH 27.5*   MCHC 32.3   RDW 15   MPV 9.5       No results found for this basename: TSH,FREET3,FREET4 in the last 168 hours        Rads:     Radiology Results (24 Hour)     ** No Results found for the last 24 hours. **                  Assessment and Plan:      Patient Active Problem List   Diagnosis   . S/p R TKA, readmitted 09/05/13. S/p R TKA I&D and Poly Exchange, s/p Revision. S/p gastrocnemius muscle flap  covering her medial wound. This requires close monitoring by MD at least 4 times a week . Recommend acute rehab with her above issue and MMP    continue plan per  Ortho, ID, and Plastic Surgery.  ID:  Original Cx were  Proteus mirabilis which has not grown out since.     She should continue the Cipro through Jan 19th    labs next week: CBC, CMP, CRP, ESR and also the second week of January   Poor wound healing/Increased nutritional need- on Boost Breeze and Juven for supplementation   . Low back pain - controlled   . Chronic obstructive pulmonary disease- Continue spriva. Continue albuterol nebs prn- has not used recently. Currently stable. Continue to encourage IS use.    Marland Kitchen Hypertension- BP improved. Continue meds    Post-operative Acute Anemia, s/p surgery. Continue Fe/Vit C. Pt s/p transfusions. Currently asymptomatic.     Post-operative pain- treat PRN per Ortho   Family continues to be very supportive.      Recommend acute rehab with her above issues  Pt is cleared to be discharged to rehab once accepted     Signed by: Dorathy Daft, MD

## 2013-10-03 NOTE — Plan of Care (Signed)
Problem: Safety  Goal: Patient will be free from injury during hospitalization  Outcome: Progressing  Safety precautions maintained,appropriate safety device has been used,pt encouraged to call for assistance .    Problem: Pain  Goal: Patient's pain/discomfort is manageable  Outcome: Progressing  On tramadol every 6 hours and OxyContin every 12 hrs , pain minimal,right leg elevated and repositioned with pillow, pt did not require extra pain med at this moment.

## 2013-10-03 NOTE — H&P (Signed)
IRF Post-Admission Assessment  History and Physical    Date of admission: 10/03/2013    Attending Physician: Dr. Hendricks Limes    Reason for admission: Impaired mobility and self care due to complicated  R TKA on 08/25/13, revisions on 09/05/13, 12/8 /14, 09/22/13 with muscle flap/ skin grafting on 09/22/13    Old medical records, laboratory and imaging test results have been reviewed    History of present illness:   This 77 y.o. year old female with history of R TKA on 08/25/13 by Dr. Ahmed Prima was readmitted to [facility] on 09/05/13 and underwent  revisions on 09/05/13, 12/8 /14, 09/22/13 with muscle flap/ skin grafting by plastic surgery  on 09/22/13. The patient is to keep leg extended ; current WB status is 10 %  Wound culture was positive for Proteus. Per ID ,continue Cipro through 10/27/13    On 10/03/13, the patient was deemed medically stable to be discharged to acute inpatient rehabilitation.     Functional history:  Premorbidly, the patient was modified independent with mobility and independent with ADLs. Currently, she requires SBA for mobility/ ADLs  and moderate assistance for bathing    Past Medical History:   Past Medical History   Diagnosis Date   . Hypertension    . Pneumonia    . Chronic obstructive pulmonary disease    . Headache(784.0)      not since surgery in 1957   . Arthritis    . Difficulty in walking(719.7)    . Low back pain      Social History:  Living arrangements: will stay in daughter's house which has 8 steps from outside to basement level where patient will be staying     Family History: noncontributory    Allergies: No Known Allergies    Medications:reviewed and reconciled     Scheduled Meds:  PRN Meds:        amLODIPine  10 mg  Oral  Daily  albuterol  2.5 mg  Q4H PRN    bisoprolol  5 mg  Oral  Daily  bisacodyl  10 mg  QD PRN    celecoxib  200 mg  Oral  Daily  diphenhydrAMINE  25 mg  Q6H PRN    ciprofloxacin  750 mg  Oral  Q12H SCH  HYDROcodone-acetaminophen  1 tablet  Q4H PRN     cloNIDine  0.1 mg  Oral  Daily  HYDROcodone-acetaminophen  2 tablet  Q6H PRN    docusate sodium  100 mg  Oral  Q12H SCH  HYDROcodone-acetaminophen  2 tablet  Q4H PRN    ferrous sulfate 324 mg-ascorbic acid 500 mg combo dose   Oral  Daily  HYDROmorphone  0.5 mg  Q1H PRN    gabapentin  300 mg  Oral  BID  hydrOXYzine  25 mg  Q12H PRN    hydrochlorothiazide  25 mg  Oral  Daily  naloxone  0.1 mg  PRN    oxyCODONE  10 mg  Oral  Q12H SCH  ondansetron  4 mg  Q8H PRN    polycarbophil  1,250 mg  Oral  Daily  ondansetron  4 mg  Q8H PRN    polyethylene glycol  17 g  Oral  Daily  oxyCODONE-acetaminophen  1 tablet  Q4H PRN    potassium chloride  10 mEq  Oral  Daily  senna-docusate  2 tablet  BID PRN    Senna  17.2 mg  Oral  QHS  zolpidem  5 mg  QHS  PRN    tiotropium  18 mcg  Inhalation  QAM     traMADol  50 mg  Oral  Q6H SCH       Continuous Infusions:                 Review of systems:  Pertinent positives [id number] yesterday and Constipation (now resolved)  A full review of systems was obtained and was otherwise negative.    Physical Examination:   Filed Vitals:    10/04/13 0505   BP: 155/67   Pulse: 66   Temp: 97.6 F (36.4 C)   Resp: 18   SpO2: 95%     Estimated Body mass index is 43.75 kg/(m^2) as calculated from the following:    Height as of 09/05/13: 5\' 3" (1.6 m).    Weight as of 09/05/13: 246 lb 14.6 oz(112 kg).      General appearance: Appears well.  In no acute distress. Morbidly obese.  Eyes:Anicteric sclerae. Conjunctivae non-injected.  HENT:Moist mucous membranes.  CV:+S1S2 Heart rate and rhythm are regular. 1+ edema in the BLEs.  Pulm:Lungs are clear to auscultation bilaterally. No wheezes, rales, or rhonchi. Good respiratory effort.  NWG:NFAO. Non-tender. Normoactive bowel sounds.  Skin:Punctate open area within R knee incision with serosang drainage  Neuro:CN 2-12 are grossly intact .Sensation to LT is intact.         Manual muscle strength testing:  Muscle group Left Right   Shoulder abductors 5 5    Elbow flexors 5 5   Elbow extensors 5 5   Finger flexors 5 5   Hip flexors 5 3   Knee flexors 5 immobilized   Knee extensors 5 immobilized   Ankle dorsiflexors 5 5   Ankle plantarflexors 5 5   EHL 5 5     Psych:Alert Pleasant and cooperative    Labs:    Lab  09/29/13 0523    GLU  90    BUN  9    CREAT  0.6    CA  8.4    NA  141    K  3.2*    CL  102    CO2  29    ALB  2.2*    PHOS  --    MG  --    EGFR  >60.0    PT  --    PTT  --    INR  --        Lab  09/29/13 0523    AST  17    ALT  <6    ALKPHOS  77    ALB  2.2*    BILITOTAL  0.5    AMY  --    LIP  --        Lab  09/29/13 0523    WBC  7.41    HGB  9.6*    HCT  29.7*    MCV  85.1    MCH  27.5*    MCHC  32.3    RDW  15    MPV  9.5      Lab Results   Component Value Date    PROT 5.3* 09/29/2013    ALB 2.2* 09/29/2013         Results     Procedure Component Value Units Date/Time    Urinalysis with microscopic [[id number]]  (Abnormal) Collected:10/03/13 2259    Specimen Information:Urine Updated:10/03/13 2328     Urine Type Clean Catch  Color, UA Yellow      Clarity, UA Sl Cloudy (A)      Specific Gravity UA 1.014      Urine pH 7.0      Leukocyte Esterase, UA Large (A)      Nitrite, UA Negative      Protein, UR Negative      Glucose, UA Negative      Ketones UA Negative      Urobilinogen, UA 4.0 (A) mg/dL      Bilirubin, UA Negative      Blood, UA Negative      RBC, UA 0 - 5      WBC, UA 26 - 50 (A)      Squamous Epithelial Cells, Urine 6 - 10      Urine Bacteria Rare     Urine culture [[id number]] Collected:10/03/13 2259    Specimen Information:Urine / Urine, Clean Catch Updated:10/03/13 2300    MRSA culture [[id number]] Collected:10/03/13 2259    Specimen Information:Body Fluid / Nares and Throat Updated:10/03/13 2300               Assessment:    77 yo F with mobility/ADL dysfunction due to  complicated  R TKA on 08/25/13, revisions on 09/05/13, 12/8 /14, 09/22/13 with muscle flap/ skin grafting on 09/22/13       Patient Active Problem List   Diagnosis   . Total  knee replacement status   . Low back pain   . Chronic obstructive pulmonary disease   . Hypertension   - Morbid obesity  - Protein malnutrition  - Post-op anemia  - UTI - present on admission    [x]  Requires an intensive inpatient rehabilitation program with multidisciplinary therapies, rehab nursing, and close physician management.    [x]  The following co-morbidities may complicate rehabilitation:  Anemia and Wounds    [x]  Is at risk for the following complications:  Injurious falls, Contracture, Heterotopic ossification, Pneumonia, Urinary tract infection, Venous thromboembolic disease and Fecal impaction         Individualized  Plan of Care:    - REHAB:   Begin comprehensive and intensive inpatient rehab program, including:  Physical therapy 60-120 min daily, 5-6 times per week  Occupational therapy  60-120 min daily, 5-6 times per week  Therapeutic recreation  Psychology  Case management  Rehabilitation nursing    Will work in an interdisciplinary manner to address the following impairments and issues:   Mobility, ADLs, Impaired strength, Impaired ROM, Impaired endurance, Adherence to precautions, Caregiver training, Wounds, Medication management, Adjustment to disability, Leisure skills, Community support and resources and Coping strategies      Requires 24h rehabilitation nursing to address:  Antibiotic administration, Bladder care, Bowel care, Hydration needs, Medication teaching, Nutrition, Positioning, Safety and Wounds    Anticipate a discharge to:  Home with assistance    Estimated length of stay: 7-10 days     Goals for discharge:  Bed mobility Modified independent    Transfers Modified independent    Locomotion Modified independent    Upper body dressing Modified independent    Lower body dressing Modified independent    Bathing Supervision   Toileting Modified independent    Communication Use compensatory strategies to express needs and wants     Swallow Tolerate least restrictive oral diet without  signs or symptoms of aspiration     Cognition Use compensatory strategies appropriately to compensate          Rehab potential: good  Prognosis: good    Potential limitations:Challenging home environment    Precautions: falls, PWB to  R LE  ,10%    Co-morbid conditions that require management/monitoring:   complicated  R TKA on 08/25/13, revisions on 09/05/13, 12/8 /14, 09/22/13 with muscle flap/ skin grafting on 09/22/13 :incision monitoring, daily dressing change, 10% WB to R LE; cipro through 10/27/12  Poor wound healing: Juven ,Boost Breeze  COPD: spiriva, albuterol, IS    - Patient must keep R knee extended at all times- should not use knee immob due to pressure  - Await urine culture speciation prior to initiating additional antibiotics, as she is already on Cipro   - Optimize nutritional status  - Will repeat CRP and sed rate at least weekly while hospitalized      Review of Pre-admission assessment  [x]  I have reviewed the nurse liaison's pre-admission assessment in [employer].   I do not note any significant changes at this time and agree with patient's appropriateness for intensive inpatient rehab program.    Leotis Shames T. Nile Riggs, MD, MPH  Physical Medicine and Rehabilitation &  Brain Injury Medicine

## 2013-10-03 NOTE — PT Progress Note (Addendum)
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  Patient cleared by nurse to participate in PT session.    Subjective: I hope I get accepted into rehab today.  Im so tired of being in this bed  Patient is agreeable to participation in the therapy session.     Pain Assessment  Pain Assessment: No/denies pain          Objective:  Patient is in bed seen 11 Days Post-Op with IV and dressings.                                       Sensation is intact              Functional Mobility  Supine to Sit: Stand by assistance (HOB elevated)  Scooting to EOB: Stand by assistance (requires max effort)  Sit to Stand:  (contact guard assist)  Stand to Sit:  (contact guard assist)    Transfers  Bed to Chair: Stand by assistance (requires max effort)  Device Used for Functional Transfer: front-wheeled walker    Locomotion  Ambulation: with front-wheeled walker (contact guard assist)  Ambulation Distance (Feet): 5 Feet (limited by decreased endurance)  Pattern: R decreased stance time;Step to;decreased step  length;decreased cadence  Wheelchair Mobility:  50 ft x 2stand by assistance (verbal cues, mobility limited by decreased endurance)            Self Care and Home Management:  Pt did not perform self care activities at this time.    Educated the patient to role of physical therapy, plan of care, goals  of therapy and HEP, safety with mobility and ADLs, weight bearing precautions.    Patient is seated in wheelchair with all needs provided and call bell within reach. RN notified of session outcome, including pt's mobility status.    Assessment:   Assessment  Assessment: Decreased LE strength;Decreased LE ROM;Gait impairment;Decreased functional mobility;Decreased endurance/activity tolerance  Prognosis: Good;With continued PT status post acute discharge.     Pt is making progress toward goals, however would benefit from intensive PT to address functional limitations.  Bed mobility and transfers requires max effort from pt, however pt is motivated to progress as demonstrated by excellent participation effort.  Pt's endurance is a limiting factor, however pt is able to participate for extended periods of time if allowed periodic rest breaks.  Pt is an  excellent candidate for AR, given her independent PLOF.        Goals per Eval/Re-eval:     Goals  Goal Formulation: With patient;With patient/family  Time for Goal Acheivement: 7 visits  Goals: Select goal  Pt Will Go Supine To Sit: New goal;With stand by assist  Pt Will Perform Sit to Stand: New goal;With stand by assist  Pt Will Transfer to Toilet: Discontinued (comment) (OT to address)  Pt Will Transfer to Tub/Shower: With minimal assist  PT Will Demonstrate Car Transfer Technique: With minimal assist  Pt Will Ambulate: 1-10 feet;with rolling walker;With minimal assist;Goal met  Pt Will Go Up / Down Stairs: Discontinued (comment)  Pt Will Perform Home Exer Program: With minimal assist  Pt Will Propel Wheelchair: 51-150 feet;With stand by assist;Partly met (requires  multiple rest breaks 2* decreased endurance)  Pt Will Perform LE Dressing w/Device: Discontinued (comment) (OT to address)  Other Goal:  (New goal: NWB)    Plan:  Recommendation  Discharge Recommendation: Acute Rehab  DME Recommended for Discharge:  (leg loop lifter)  PT - Next Visit Recommendation: 10/04/13  PT Frequency: 7x/wk    Continue plan of care.      Signature: Madelaine Bhat, PTA 10/03/2013 8:55 AM

## 2013-10-03 NOTE — Progress Notes (Signed)
Pt transfered to [facility], room # 553 via bed in stable condition, family at bed side during transfer.

## 2013-10-03 NOTE — OT Progress Note (Addendum)
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  Patient is agreeable to participation in the therapy session. Family and/or guardian are agreeable to patient's participation in the therapy session. Nursing clears patient for therapy.  Pain: 3/10  Location: R knee  Therapist Intervention: RN aware. OT assisted with positioning.   Patient is satisfied with therapist intervention.    Objective:  Patient is in bed with Intravenous Access and Dressing  in place.     Cognition:  WFL    Self Care:  Feeding: NT  Grooming: CGA standing at sink  Bathing: Mod A (without equipment)  UB Dressing: independent; seated in wheelchair   LB Dressing: CGA (required for standing balance. Pt able to don/doff socks and thread pants on/off using reacher and sock aid with supervision seated in chair)  Toileting: CGA required for standing balance to complete toilet hygiene and to don/doff pants around waist     Functional Mobility:  Rolling: NT  Scooting: independent in wheelchair and at EOB  Supine to Sit: supervision and increased time  Sit to Supine: NT  Sit to Stand: CGA (completed 3  times in session)   Transfers: CGA to ambulate 3 steps from bed to wheelchair using FWW. (increased time and max effort required).     Treatment Activities: ADLs, functional mobility    Observation of Patient:  Vitals:      Filed Vitals:    10/02/13 1515 10/02/13 2135 10/03/13 0454 10/03/13 0900   BP: 138/60 149/64 142/63 109/59   Pulse: 62 62 60 65   Temp: 94.6 F (34.8 C) 96.4 F (35.8 C) 98.1 F (36.7 C) 98.1 F (36.7 C)   TempSrc:   Oral    Resp: 19 18 18 16    Height:       Weight:       SpO2:  96% 95% 95%       Education:   Educated the patient to role of occupational therapy, plan of care,  goals of therapy and HEP, safety with mobility and ADLs, weight bearing precautions, LE ADL equipment and techniques.    Patient left in wheelchair next to bed with all needs met, equipment intact and call bell within reach. RN notified of session outcome.  Updated status posted at bedside.    Assessment: Pt demonstrated significant improvement with activity tolerance and standing balance today. Pt able to stand at sink to complete her ADLs today with CGA. Pt's ability to complete ADLs and functional transfers continues to be impaired due  to the following deficits: Pain, activity tolerance, standing balance, NWB status to RLE (pt needs cueing to [place] during mobility), and safety awareness. Pt would continue to benefit from OT to address these deficits and increase independence with ADLs and functional transfers. Pt would make an excellent candidate for acute rehabilitation.  Pt is very motivated to participate with therapy, presents with good carry-over of learning new techniques, and continues to demonstrate significant improvement with activity tolerance during each session.     Goals:  Time For Goal Achievement: 5 visits  ADL Goals  Patient will groom self:  (standing at sink with CGA)  Patient will dress lower body: with AE;with supervision (goal updated)  Pt will complete bathing: With stand by assist (goal  updated)  Patient will toilet: with supervision (goal updated)  Mobility and Transfer Goals  Pt will transfer bed to [facility]:  (with CGA)     Executive Fucntion Goals  Other Goal: Pt will independenlty maintain NWB status 100% of the time during ADLs and functional mobility     OT Plan  Treatment Interventions: ADL retraining;Functional transfer training;UE strengthening/ROM;Endurance training;Patient/Family training;Equipment eval/education;Compensatory technique education;Continued evaluation  Discharge Recommendation: Acute Rehab  DME Recommended for Discharge:  (defer to next level of rehab)  OT Frequency Recommended: 4-5x/wk    Continue plan of care.    Signature:   Louie Boston, OT  10/03/2013 12:31 PM   Phone: [phone]

## 2013-10-03 NOTE — Plan of Care (Signed)
Dry dressing changed to right knee as per order,staples intact.

## 2013-10-03 NOTE — Progress Notes (Signed)
PROGRESS NOTE    Date Time: 10/03/2013 8:08 AM  Patient Name: [name], [name]      Assessment:    Right TKA Irrigation and Debridement and Poly Exchange 09/05/13, 12/8 resection and antibiotic spacer, 12/15 antibiotic spacer exchange and plastic surgery coverage.   Plan:    PT - Non weight bearing in Extension with brace at all times, No knee ROM   DVT: ASA 325 bid   ID: Pan-Sensitive Proteus ssp Final Recommendations Cipro 750 bid for 6 weeks, on Ancef for now per ID   Appreciate plastics input re: gastroc flap  Dispo: rehab today    Subjective:   Pain controlled, tolerating diet, comfortable this AM, no issues    Medications:     Current Facility-Administered Medications   Medication Dose Route Frequency   . amLODIPine  10 mg Oral Daily   . bisoprolol  5 mg Oral Daily   . celecoxib  200 mg Oral Daily   . ciprofloxacin  750 mg Oral Q12H SCH   . cloNIDine  0.1 mg Oral Daily   . docusate sodium  100 mg Oral Q12H SCH   . ferrous sulfate 324 mg-ascorbic acid 500 mg combo dose   Oral Daily   . gabapentin  300 mg Oral BID   . hydrochlorothiazide  25 mg Oral Daily   . oxyCODONE  10 mg Oral Q12H SCH   . polycarbophil  1,250 mg Oral Daily   . polyethylene glycol  17 g Oral Daily   . potassium chloride  10 mEq Oral Daily   . Senna  17.2 mg Oral QHS   . tiotropium  18 mcg Inhalation QAM   . traMADol  50 mg Oral Q6H SCH       Physical Exam:     Filed Vitals:    10/03/13 0454   BP: 142/63   Pulse: 60   Temp: 98.1 F (36.7 C)   Resp: 18   SpO2: 95%       Intake and Output Summary (Last 24 hours) at Date Time  No intake or output data in the 24 hours ending 10/03/13 0808    General appearance - alert and oriented, in no acute distress  Dressing c/d/i  Fires TA/EHL/GSC  SILT SP/DP/TN  2+ DP pulse  Calves soft, nontender bilateral    Labs:     Recent CBC   No results found for this basename: WHITEBLOODCE,RBC,HGB,HCT,,LABPLAT, NRBCA, REFLX, ANRBA in the last 24 hours  Recent BMP   No results found for this basename:  GLU,BUN,CREAT,CA,NA,K,CL,CO2,AGAP in the last 24 hours  Recent PT/INR No results found for this basename: PTI,INR,COUM,COUMP in the last 24 hours      Signed by: Marrian Salvage

## 2013-10-03 NOTE — Rehab Progress Note ([employer]) (Signed)
[name]  MRN: [id number]  Account: [id number]  Session Start: 10/03/2013 12:00:00 AM  Session Stop: 10/03/2013 12:00:00 AM    SEVERE SEPSIS SCREEN  INFECTION:  Patient has documented infection. Is on antibiotic therapy (not  prophylaxis).    SYSTEMATIC IMFLAMMATORY RESPONSE SYNDROME (SIRS):  Patient  has no indication of  systematic inflammatory response syndrome (SIRS). Negative Sepsis Screen.  If you are unable to assess a system's dysfunction because you do not have labs,  or the labs you have are not current (within 24 hours), call physician and  request and order for the lab tests needed.    Signed by: Theodoro Kalata, RN 10/03/2013 11:39:00 PM

## 2013-10-04 NOTE — Rehab Progress Note ([employer]) (Signed)
NAMEANNDREA MIHELICH  MRN: [id number]  Account: [id number]  Session Start: 10/04/2013 12:00:00 AM  Session Stop: 10/04/2013 12:00:00 AM    SEVERE SEPSIS SCREEN  INFECTION:  Patient has documented infection. Is on antibiotic therapy (not  prophylaxis).    SYSTEMATIC IMFLAMMATORY RESPONSE SYNDROME (SIRS):  Patient  has no indication of  systematic inflammatory response syndrome (SIRS). Negative Sepsis Screen.  If you are unable to assess a system's dysfunction because you do not have labs,  or the labs you have are not current (within 24 hours), call physician and  request and order for the lab tests needed.    Signed by: Stephanie Acre, RN 10/04/2013 8:00:00 AM

## 2013-10-04 NOTE — Rehab Evaluation ([facility]) (Signed)
[name]  MRN: [id number]  Account: [id number]  Session Start: 10/03/2013 12:00:00 AM  Session Stop: 10/03/2013 12:00:00 AM    Rehabilitation Nursing  Inpatient Rehabilitation Admission Assessment    Rehab Diagnosis:  Demographics:            Age: 77Y            Gender: Female  Past Medical History: L. total knee replacement  History of Infection:   L. total knee infection Date of Onset: 09-05-13 Site: right knee    Patient's Behavior: accepting, but wishes that she did not have infection.  History of Present Illness:            Date of Onset: 09-05-13            Date of Admission: 10/03/2013 6:43:00 PM    Rehabilitation Precautions Restrictions:   fall precautions, infection control,  Social History:  Marital Status:  Children:          Reside:  Employment Status:  Recreational Activities/Hobbies:    HEIGHT and WEIGHT  Weight: 0 pounds. 0 kilograms. Patient weighed using bed scale.  Height: 63 inches. 1.6 meters.  BMI: 0 kilogram per meters squared.    ORIENTATION  Instructions and information: Administered to: Patient and Family were given  instructions and information on hospital orientation. Orientation was provided  for the following areas: orientation checklist, bed controls, call light,  chaplain, daily routine, meal times, phone and phone numbers, and visiting  hours.  Primary Language: english  Armband: Correct armband in place.  Valuables/Personal Items:  Patient does not have valuables present.  Understanding of Current Condition: Patient is aware that she has right knee  infection and has had multiple surgeries. She is aware that she is here for  acute rehab inpatient services.  Patient/Caregiver Goals:  Patient's functional goals: I want to get better so  that I can go home.    MEDICATIONS AND ALLERGIES  No B/P or Needle Sticks: Not applicable.  Personal Medications: Patient did not bring a personal supply of medications.  Vaccinations:     Not applicable.  IV Access: No IV access.  Dialysis  Access: Patient does not have dialysis access.    Medication Allergies:  Food Allergies:  Other Allergies:    Pain: Patient currently without complaints of pain.  Pain Reassessment: Pain was not reassessed as no pain was reported.    Elopement Risk Level Assessment Tool  Patient Criteria: Patient is not capable of leaving the unit.  Assessment is not  applicable.      RISK ASSESSMENT FOR FALLS/INJURY    MENTAL STATUS CRITERIA:   0 - None identified.  MENTAL STATUS TOTAL: 0    AGE CRITERIA:   77 - 52-11 years old  AGE TOTAL: 3    ELIMINATION CRITERIA:   3 - Toileting with Assistance.   ELIMINATION TOTAL: 3    HISTORY OF FALLS CRITERIA:   2 - Unknown History.  HISTORY OF FALLS TOTAL: 2    MEDICATIONS CRITERIA:   2 or more High Risk Medications (*see list below)   MEDICATIONS TOTAL: 2    PHYSICAL MOBILITY CRITERIA:   1 - Weakness/impaired physical mobility.   3 - Decreased balance reaction.   PHYSICAL MOBILITY TOTAL: 4    FALLS RISK ASSESSMENT TOTAL: 12    Patient's Fall Risk: TOTAL SCORE >10: High Risk    Falls Interventions: Clutter removed and clear path to BR.  Call bell, phone, glasses, etc within reach.  Assistive devices at [facility].  Bed alarm      SEVERE SEPSIS SCREEN  INFECTION:  Patient has documented infection. Is on antibiotic therapy (not  prophylaxis).    SYSTEMATIC IMFLAMMATORY RESPONSE SYNDROME (SIRS):  Patient  has no indication of  systematic inflammatory response syndrome (SIRS). Negative Sepsis Screen.  If you are unable to assess a system's dysfunction because you do not have labs,  or the labs you have are not current (within 24 hours), call physician and  request and order for the lab tests needed.      Restraint Initial: Patient does not have any restraints at this time.    NUTRITION/DIET  IRF-PAI Swallowing Status: Swallowing Status: Regular Food: solids and liquids  swallowed safely without supervision or modified food consistencies.  IRF-PAI Dehydration: Patient does not display clinical signs of  dehydration.  Nutrition Screen: Patient's nutrition risk factors include: No risk factors  identified.  Diet: Type: Regular.  Food Consistency: Regular.  Liquid Consistency: Thin.    PSYCHOSOCIAL  Psychosocial/Abuse Screen: No evidence of neglect/abuse.  Suicide Risk Screen: Patient does not have a primary or secondary behavioral  health diagnosis or complaint.    Current Alcohol Use:  Current Tobacco Use:  Current Drug Use:    REVIEW OF SYSTEMS  Eyes:   Right Eye:  Reactivity: Pupils equal, round and reactive to light and accommodation  (PERRLA).  Sclera Color: Clear   Eye is not draining     Left Eye:  Reactivity: Pupils equal, round and reactive to light and accommodation  (PERRLA).  Sclera Color: Clear   Eye is not draining  Ears: Bilateral Ears: Within normal limits.  Hearing/Communication Device: None.  Mouth: Gums: Moist. Pink.  Tongue: Pink  Teeth: Oral hygiene appears to be adequate.    NEURO  Orientation/Awareness: Alert and Oriented x4.  Behavior: Cooperative.  Speech: Clear.    CARDIOVASCULAR     Bilateral lower extremities  Nail Bed Color: Pink.   No edema or redness present.  Homan's Sign:   Negative bilateral lower extremities  Pulses:   Apical Pulse: Regular. Strong. Rate is .   Patient does not have a pacemaker.   Patient does not have a defibrillator.    CARDIOPULMONARY  Lung Sounds:  Type of Respirations: Regular.  Cough: No cough noted.  Respiratory Support: The patient does not require any respiratory support.  Respiratory Equipment: None.    INTEGUMENTARY  Skin:  Temperature: Warm  Turgor: Normal for age  Moisture: Dry  Color of skin: Normal for Race/Ethnicity  Capillary Refill: Less than 3 seconds  Wound/Incisions:     Surgical Incision: Right knee incision. Length: 20 centimeter(s) with staples  and sutures. No signs of infection.  Drainage: Incision without drainage.  Odor:  No  Incision Care: Incision healing     Surgical Incision: Right knee incision. Length: 18 centimeter(s) with  staples.  No signs of infection.  Drainage: Incision without drainage.  Odor:  No  Incision Care: Incision healing  Braden Scale for Predicting Pressure Sore Risk: Sensory Perception: No  impairment  Moisture: Rarely moist  Activity: Chairfast  Mobility: Slightly limited  Nutrition: Adequate  Friction and Shear: Potential problem  Braden Score: 18  Level of Risk: At risk (15-18)   Pressure Relief every 30 minutes while in wheelchair   Turn patient every 2 hours   Assist patient to the bathroom every 2 hours    GENITOURINARY  Current Bladder Pattern: Continent  Color:  Yellow   Patient  denies problems with urination and/or catheter.    Sexuality: The patient denies any concerns regarding sexuality.    GASTROINTESTINAL  Abdomen: Soft. Nontender.  Bowel Sounds:  Bowel sounds audible in all four quadrants.  Date of Last Bowel Movement:  10-02-13   No Problems/Complaints with Bowel Elimination Assessed.  The patient has normal bowel activity.  Bowel Movements: The patient has bowel movements every other day. Patient uses  laxatives.    MUSCULOSKELETAL  Hand Dominance:  Right.  Upper Extremities  Impairments/Prosthesis/Devices:  Lower Extremities  Impairments/Prosthesis/Devices:    FUNCTIONAL MEASURES  Bladder Frequency/Number of Accidents (4 days prior to admission):  Bladder  accidents prior to admission:  0 Patient has not had an accident 4 days prior to  admission.  Bowel Frequency/Number of Accidents (4 days prior to admission):  Bowel  accidents prior to admission:  0 Patient has not had an accident 4 days prior to  admission.    EATING: Eating Score = 5. Patient is supervision/set-up for eating, requiring:  Opening containers.  Patient requires the following assistive device(s) No assistive devices were  required.    GROOMING: Grooming Score = 5. Patient is supervision/set-up for grooming,  requiring: Setting out grooming equipment.  Patient requires the following assistive device(s) No assistive devices  were  required.    BATHING: Patient took sponge bath at sink. Bathing Score = 5.  Patient is  supervision/set-up for bathing, requiring: Setting out bathing equipment.  Preparing the water.  Preparing washing materials.  Patient requires the following assistive device(s):    UPPER BODY DRESSING: Wearing a bra or undershirt was not observed for this  patient. Patient requires minimal physical assistance for threading the right  arm through the garment (shirt/sweater). Patient requires minimal physical  assistance for threading the left arm through the garment (shirt/sweater).  Patient requires moderate physical assistance for pulling an over-head-garment  over head or pulling front-fastening-garment around back. Patient requires  moderate physical assistance for pulling an over-head-garment down the trunk or  adjusting/fastening together a front-fastening-garment. Patient performs 62.5 %  of upper body dressing tasks. Upper Body Dressing Score = 3, Moderate  Assistance.  Patient requires the following assistive device(s):  No assistive devices were  required.    LOWER BODY DRESSING: Wearing underwear or an undergarment was not observed for  this patient. Patient requires total assistance for donning and/or doffing  pants/skirt threading right leg. Patient requires total assistance for donning  and/or doffing pants/skirt threading left leg. Patient requires total assistance  for donning and/or doffing pants/skirt over hips and adjusting fastener. Patient  requires total assistance for donning and/or doffing right sock. Patient  requires total assistance for donning and/or doffing left sock. Donning and/or  doffing right shoe was not observed for this patient. Donning and/or doffing  left shoe was not observed for this patient. Patient performs 0 -  24% of lower  body dressing tasks. Lower Body Dressing  Score = 1, Total Assistance.  Patient requires the following assistive device(s): No assistive devices  were  required.    TOILETING: Patient requires maximal assistance for adjusting clothing before  using a toilet, commode, bedpan, or urinal. Patient requires no physical  assistance for hygiene. Patient requires maximal assistance for adjusting  clothing after using a toilet, commode, bedpan, or urinal. Patient performs  33.33 % of toileting tasks. Toileting Score = 2. Maximal Assistance.  Patient requires the following assistive device(s):  Grab bar.    BLADDER MANAGEMENT - LEVEL OF ASSIST: Bladder  Score = 7. Patient is completely  independent for bladder management. There are no activity limitations.    BLADDER ACCIDENTS THIS SHIFT:  0 . Patient has not had an accident this  assessment and did not require medications or devices.    BOWEL MANAGEMENT - LEVEL OF ASSIST: Bowel Score = 6.  Patient is modified  independent for bowel management.  Patient did not have bowel movement.  Medication/intervention was provided.    BOWEL ACCIDENTS THIS SHIFT: 0 . Patient has not had an accident, but used a  non-natural laxative.    TRANSFERS BED/CHAIR/WHEELCHAIR: Bed/chair/wheelchair Transfer Score = 3.  Patient performs 50-74% of effort and requires moderate assistance (some  lifting) for transferring to and from the bed/chair/wheelchair.  Patient requires the following assistive device(s): Bed rails.    TRANSFER TOILET: Toilet Transfer Score = 3.  Patient performs 50-74% of effort  and requires moderate assistance (some lifting) for transferring to and from the  toilet/commode.  Patient requires the following assistive device(s):  Walker.  Grab bars.    COMPREHENSION: Auditory comprehension is the usual mode. Comprehension Score =  7, Independent.  Patient comprehends complex/abstract information in their  primary language.  Patient is completely independent for auditory comprehension.   There are no activity limitations.    EXPRESSION: Vocal expression is the usual mode. Expression Score = 7,  Independent.  Patient expresses  complex/abstract information in their primary  language.  Patient is completely independent for vocal expression.  There are no  activity limitations.    SOCIAL INTERACTION: Social Interaction Score = 7, Independent. Patient is  completely independent for social interaction.  There are no activity  limitations.    PROBLEM SOLVING: Problem Solving Score = 7, Independent.  Patient makes  appropriate decisions in order to solve complex problems.  Patient is completely  independent for problem solving.  There are no activity limitations.    MEMORY: Memory Score = 7, Independent.  Patient is completely independent for  memory.  There are no activity limitations.    IRFPAI GOALS    Grooming: 7  Bathing; 6  Upper Body Dressing: 4  Lower Body Dressing: 4  Toileting: 6    FUNCTION  Functional Screen: Patient's functional risk limitations include: Decreased use  of one or both lower extremities.  Decreased use of one or both upper extremities.  Requires assistance for ambulation.  Significant ortho/surgical episode.  Unable to perform Activities of Daily Living.  Generalized weakness. Therapy consults were ordered.  Discharge Planning Screen: Potential discharge planning issues include: None  Social Services/Case Management consult was ordered. computer    Interdisciplinary Educational Needs and Learning Preferences:       Learning Preference: The patient's preferred learning method is:  Explanation.  The patient's preferred learning method is: Programme researcher, broadcasting/film/video.       Barriers to Learning: No barriers.       Learning Needs: Pain management.  Safety.  Skin/wound care.  Medical management.    Education Provided: Pain management. Pain scale. Medication options. Side  effects. Clinical indicators of pain.       Audience: Patient and Caregiver       Mode: Explanation.  Teacher, English as a foreign language provided.       Response: Verbalized understanding.  Needs reinforcement.    ASSESSMENT  Summary of Rehab Nursing-specific Deficits:   Safety:  Risk for fall   Pain (Acute, Chronic, Pain management)   Self-care deficits (Feeding, Toileting, Bathing and Hygiene, Medication  management)  Rehab Potential:  Able to participate in an intensive inpatient interdisciplinary  rehabilitation program, Good family/social support, Motivated  Barriers to Progress/Discharge: No potential barriers to progress.    Long Term Goals:   Time frame to achieve long term goal(s): Within in 2 weeks,  patient will be able to state medication, indications, and possible side  effects.       2. Within 2 weeks, patient will be able verbalize 2/10 on pain scale on a  consistent basis for effective pain management.  Short Term Goals:  Time frame to achieve short term goal(s): Within 2 weeks,  patient will have effective pain management using medication as prescribed, and  verbalize 2/10 pain scale.       2. Within 2 weeks, patient will be able to state medications being taken  along with indications, and possible side effects.      PLAN  Nursing-specific Interventions:   Medication Management:   Skin Management:   Pain Management:   Wound Management:    TEAM CARE PLAN  Identified problems from team documentation:      Identified problems from this assessment:     Pain Management : Team will collaborate and work efficiently and effectively in  achieving manageable pain levels while participating in acute rehab.    Discipline:  Nursing    Please review Integrated Patient View Care Plan Flowsheet for Team identified  Problems, Interventions, and Goals.    Signed by: Theodoro Kalata, RN 10/03/2013 9:00:00 PM

## 2013-10-04 NOTE — Rehab Progress Note ([facility]) (Signed)
[name]  MRN: [id number]  Account: [id number]  Session Start: 10/04/2013 12:00:00 AM  Session Stop: 10/04/2013 12:00:00 AM    Rehabilitation Nursing  Inpatient Rehabilitation Shift Assessment    Rehab Diagnosis: Gait Disturbance  Demographics:            Age: 77Y            Gender: Female  Primary Language: english    Date of Onset:  09-05-13  Date of Admission: 10/03/2013 6:43:00 PM    Rehabilitation Precautions Restrictions:   fall precautions, infection control, R knee EXT at all times, 10% WB RLE    Patient Report: hello  Pain: Patient currently without complaints of pain.  Pain Reassessment: Pain was not reassessed as no pain was reported.  Patient/Caregiver Goals:  I want to get better so that I can go home.    NEURO  Orientation/Awareness: Alert and Oriented x4.  Speech: Clear.  Behavior: Cooperative.    MEDICATIONS  IV Access: No IV access.  Dialysis Access: Patient does not have dialysis access.      Elopement Risk Level Assessment Tool  Patient Criteria: Patient is not capable of leaving the unit.  Assessment is not  applicable.      RISK ASSESSMENT FOR FALLS/INJURY    MENTAL STATUS CRITERIA:   0 - None identified.  MENTAL STATUS TOTAL: 0    AGE CRITERIA:   21 - 28-12 years old  AGE TOTAL: 1    ELIMINATION CRITERIA:   3 - Toileting with Assistance.   ELIMINATION TOTAL: 3    HISTORY OF FALLS CRITERIA:   2 - Unknown History.  HISTORY OF FALLS TOTAL: 2    MEDICATIONS CRITERIA:   2 or more High Risk Medications (*see list below)   MEDICATIONS TOTAL: 2    PHYSICAL MOBILITY CRITERIA:   1 - Weakness/impaired physical mobility.   3 - Decreased balance reaction.   PHYSICAL MOBILITY TOTAL: 4    FALLS RISK ASSESSMENT TOTAL: 12    Patient's Fall Risk: TOTAL SCORE >10: High Risk    Falls Interventions: Clutter removed and clear path to BR.  Call bell, phone, glasses, etc within reach.  Assistive devices at [facility]  Bed alarm    NUTRITION  Diet:  Type: Regular.  Food Consistency: Regular.  Liquid Consistency:  Thin.      CARDIOVASCULAR     Right Lower Extremity  Nail Bed Color: Pink.   right knee edema   Left Lower Extremity  Nail Bed Color: Pink.   + 1 edema noted  Pulses:   Apical Pulse: Regular. Strong. Rate is .   Patient has a pacemaker.   Patient does not have a defibrillator.    CARDIOPULMONARY  Lung Sounds:   Upper lobes. Clear.   Lower lobes. Clear.  Type of Respirations: Regular.  Cough: No cough noted.  Respiratory Support: The patient does not require any respiratory support.  Respiratory Equipment: None.    INTEGUMENTARY  Skin:  Temperature: Warm  Turgor: Normal for age  Moisture: Dry  Color of skin: Normal for Race/Ethnicity  Capillary Refill: Less than 3 seconds  Wounds/Incisions:     Surgical Incision: Right knee incision. Length: 20 centimeter(s) with staples  and sutures. No signs of infection.  Drainage: Incision without drainage.  Odor:  No  Incision Care: dry dressing     Surgical Incision: Right knee incision. Length: 18 centimeter(s) with staples.  No signs of infection.  Drainage: Incision without drainage.  Odor:  No  Incision Care: open to air       x 2 drain incisions to right lower leg  Braden Scale for Predicting Pressure Sore Risk: Sensory Perception: No  impairment  Moisture: Rarely moist  Activity: Chairfast  Mobility: Slightly limited  Nutrition: Adequate  Friction and Shear: Potential problem  Braden Score: 18  Level of Risk: At risk (15-18)   Pressure Relief every 30 minutes while in wheelchair   Turn patient every 2 hours   Assist patient to the bathroom every 2 hours    GASTROINTESTINAL  Abdomen: Soft. Nontender. Obese.  Bowel Sounds:  Active bowel sounds audible in all four quadrants.  Date of Last Bowel Movement:  10-04-13   No Problems/Complaints with Bowel Elimination Assessed.    GENITOURINARY  Current Bladder Pattern: Continent  Color:  Yellow   Patient denies problems with urination and/or catheter.    MUSCULOSKELETAL  Upper Body:  Lower Body: RLE- RIGHT LOWER EXTREMITY 10% WB  in extension. NO bending knee    Functional Measures      BLADDER MANAGEMENT - LEVEL OF ASSIST: Bladder Score = 7. Patient is completely  independent for bladder management. There are no activity limitations.    BLADDER ACCIDENTS THIS SHIFT:  0 . Patient has not had an accident this  assessment and did not require medications or devices.    BOWEL MANAGEMENT - LEVEL OF ASSIST: Bowel Score = 6.  Patient is modified  independent for bowel management.  Patient did not have bowel movement.  Medication/intervention was provided.    BOWEL ACCIDENTS THIS SHIFT: 0 . Patient has not had an accident, but used a  non-natural laxative.    Education Provided:    Education Provided: Medication. Name and dosage. Administration. Purpose. Side  Effects. Interaction.       Audience: Patient and Caregiver       Mode: Explanation.  Teacher, English as a foreign language provided.       Response: Verbalized understanding.  Needs reinforcement.    Discharge: Patient is not being discharged at this time.    Long Term Goals: Within 2 weeks, patient will be able verbalize 2/10 on pain  scale on a consistent basis for effective pain management.  Within in 2 weeks, patient will be able to state medication, indications, and  possible side effects.  Short Term Goals: Within 2 weeks, patient will be able to state medications  being taken along with indications, and possible side effects. - Goal Not Met  Within 2 weeks, patient will have effective pain management using medication as  prescribed, and verbalize 2/10 pain scale.    PROGRESS TOWARD GOALS: in progress    PLAN: Nursing Specific Interventions  Medication Management. Pain Management. Skin Management. Wound Management.  Continue with the current Nursing Plan of Care.    TEAM CARE PLAN  Identified problems from team documentation:  Problem: Impaired Mobility  Mobility: Primary Team Goal: Pt will be indep with all bed mobility and  transfers with LRAD, indep with walking at least 65ft iwth LRAD, mod-max A for  4  steps with LRAD and HR/Active    Problem: Impaired Pain Management  Pain Mgmt: Primary Team Goal: Team will collaborate and work efficiently and  effectively in achieving manageable pain levels while participating in acute  rehab./    Problem: Impaired Self-care Mgmt/ADL/IADL  Self Care: Primary Team Goal: Patient will perform toileting transfer and task  with MOD I.  Caregiver will demo safe supervision for bathing and LB dressing  according to family training./    Add/Update Problems from this assessment:  No updates at this time.    Please review Integrated Patient View Care Plan Flowsheet for Team identified  Problems, Interventions, and Goals.    Signed by: Theodoro Kalata, RN 10/04/2013 8:03:00 PM

## 2013-10-04 NOTE — Rehab Progress Note ([facility]) (Signed)
[name]  MRN: [id number]  Account: [id number]  Session Start: 10/04/2013 12:00:00 AM  Session Stop: 10/04/2013 12:00:00 AM    Rehabilitation Nursing  Inpatient Rehabilitation Shift Assessment    Rehab Diagnosis: Gait Disturbance  Demographics:            Age: 77Y            Gender: Female  Primary Language: english    Date of Onset:  09-05-13  Date of Admission: 10/03/2013 6:43:00 PM    Rehabilitation Precautions Restrictions:   fall precautions, infection control, R knee EXT at all times, 10% WB RLE    Patient Report: "Good morning"  Pain: Patient currently without complaints of pain.  Pain Reassessment: Pain was not reassessed as no pain was reported.  Patient/Caregiver Goals:  I want to get better so that I can go home.    NEURO  Orientation/Awareness: Alert and Oriented x3.  Speech: Clear.  Behavior: Cooperative.    MEDICATIONS  IV Access: No IV access.  Dialysis Access: Patient does not have dialysis access.      Elopement Risk Level Assessment Tool  Patient Criteria: Patient is not capable of leaving the unit.  Assessment is not  applicable.      RISK ASSESSMENT FOR FALLS/INJURY    MENTAL STATUS CRITERIA:   0 - None identified.  MENTAL STATUS TOTAL: 0    AGE CRITERIA:   75 - 75-43 years old  AGE TOTAL: 1    ELIMINATION CRITERIA:   3 - Toileting with Assistance.   ELIMINATION TOTAL: 3    HISTORY OF FALLS CRITERIA:   2 - Unknown History.  HISTORY OF FALLS TOTAL: 2    MEDICATIONS CRITERIA:   2 or more High Risk Medications (*see list below)   MEDICATIONS TOTAL: 2    PHYSICAL MOBILITY CRITERIA:   3 - Decreased balance reaction.   1 - Weakness/impaired physical mobility.   PHYSICAL MOBILITY TOTAL: 4    FALLS RISK ASSESSMENT TOTAL: 12    Patient's Fall Risk: TOTAL SCORE >10: High Risk    Falls Interventions: Clutter removed and clear path to BR.  Call bell, phone, glasses, etc within reach.  Hourly toileting/safety checks between 6am and 10pm, then every 2 hours.  Yellow "high risk" patient identification in  place: wrist band, socks, chart  sticker, door sign.  Pt and family education.  Assistive devices at [facility].  Bed alarm    NUTRITION  Diet:  Type: Regular.  Food Consistency: Regular.  Liquid Consistency: Thin.      CARDIOVASCULAR     Bilateral lower extremities  Nail Bed Color: Pink.   No edema or redness present.  Pulses:   Apical Pulse: Regular. Strong. Rate is 66 .   Patient does not have a pacemaker.   Patient does not have a defibrillator.    CARDIOPULMONARY  Lung Sounds:   Upper lobes. Clear.   Lower lobes. Diminished at bases.  Type of Respirations: Regular.  Cough: No cough noted.  Respiratory Support: The patient does not require any respiratory support.  Respiratory Equipment: None. 95% via RA    INTEGUMENTARY  Skin:  Temperature: Warm  Turgor: Normal for age  Moisture: Dry  Color of skin: Normal for Race/Ethnicity  Capillary Refill: Less than 3 seconds  Wounds/Incisions:     Surgical Incision: Anterior aspect of right knee incision Length: 20  centimeter(s) with staples and sutures. No signs of infection.  Drainage: Scant amount of serosanguineous drainage.  Odor:  No  Incision Care: Per protocol.     Surgical Incision: Posterior aspect of RIght Knee incision Length: 18  centimeter(s) with staples. No signs of infection.  Drainage: Incision without drainage.  Odor:  No  Incision Care: Per protocol.       Drainage site  Right lower leg, with minimal serouse drainage, cleaned with  normal saline and pat dry, covered with dry dressing.  Braden Scale for Predicting Pressure Sore Risk: Sensory Perception: No  impairment  Moisture: Rarely moist  Activity: Chairfast  Mobility: Slightly limited  Nutrition: Adequate  Friction and Shear: Potential problem  Braden Score: 18  Level of Risk: At risk (15-18)   Pressure Relief every 30 minutes while in wheelchair   Turn patient every 2 hours   Assist patient to the bathroom every 2 hours    GASTROINTESTINAL  Abdomen: Soft. Nontender.  Bowel Sounds:  Active bowel sounds  audible in all four quadrants.  Date of Last Bowel Movement:  10/04/13   No Problems/Complaints with Bowel Elimination Assessed.    GENITOURINARY  Current Bladder Pattern: Continent  Color:  Yellow   Patient denies problems with urination and/or catheter.    MUSCULOSKELETAL  Upper Body:  Lower Body: RLE- RIGHT LOWER EXTREMITY 10% WB in extension. NO bending knee    Functional Measures    EATING: Eating Score = 5. Patient is supervision/set-up for eating, requiring:  Opening containers.  Buttering bread.  Cutting meat.  Patient requires the following assistive device(s) No assistive devices were  required.    BLADDER MANAGEMENT - LEVEL OF ASSIST: Bladder Score = 7. Patient is completely  independent for bladder management. There are no activity limitations.    BLADDER ACCIDENTS THIS SHIFT:  0 . Patient has not had an accident this  assessment and did not require medications or devices.    BOWEL MANAGEMENT - LEVEL OF ASSIST: Bowel Score = 6. Patient is modified  independent for bowel management requiring: Stool softeners    BOWEL ACCIDENTS THIS SHIFT: 0 . Patient has not had an accident, but used a  stool softener.    Education Provided:    Education Provided: Safety issues and interventions. Fall protocol. Skin/wound  care. Signs/symptoms of infection. Incision care.       Audience: Patient.       Mode: Explanation.  Demonstration.       Response: Verbalized understanding.    Discharge: Patient is not being discharged at this time.    Long Term Goals: Within 2 weeks, patient will be able verbalize 2/10 on pain  scale on a consistent basis for effective pain management.  Within in 2 weeks, patient will be able to state medication, indications, and  possible side effects.  Short Term Goals: Within 2 weeks, patient will be able to state medications  being taken along with indications, and possible side effects.  Within 2 weeks, patient will have effective pain management using medication as  prescribed, and verbalize  2/10 pain scale.    PROGRESS TOWARD GOALS: SHORT TERM GOAL REVIEW:       2. Within 2 weeks, patient will be able to state medications being taken  along with indications, and possible side effects. - Not Met: Patient needs  reinforsment.  Time frame to achieve short term goal(s):  Within 2 weeks, patient will have  effective pain management using medication as prescribed, and verbalize 2/10  pain scale.    PLAN: Nursing Specific Interventions  Medication Management. Pain Management. Skin Management. Wound  Management.  Continue with the current Nursing Plan of Care.    TEAM CARE PLAN  Identified problems from team documentation:  Problem: Impaired Mobility  Mobility: Primary Team Goal: Pt will be indep with all bed mobility and  transfers with LRAD, indep with walking at least 49ft iwth LRAD, mod-max A for 4  steps with LRAD and HR/Active    Problem: Impaired Pain Management  Pain Mgmt: Primary Team Goal: Team will collaborate and work efficiently and  effectively in achieving manageable pain levels while participating in acute  rehab./    Problem: Impaired Self-care Mgmt/ADL/IADL  Self Care: Primary Team Goal: Patient will perform toileting transfer and task  with MOD I.  Caregiver will demo safe supervision for bathing and LB dressing  according to family training./    Add/Update Problems from this assessment:  No updates at this time.    Please review Integrated Patient View Care Plan Flowsheet for Team identified  Problems, Interventions, and Goals.    Signed by: Stephanie Acre, RN 10/04/2013 2:30:00 PM

## 2013-10-04 NOTE — Rehab Progress Note ([facility]) (Signed)
[name]  MRN: [id number]  Account: [id number]  Session Start: 10/03/2013 12:00:00 AM  Session Stop: 10/03/2013 12:00:00 AM    Wound:     Open wound. right lower leg  Length: 0.1 centimeters    Width: 0.1 centimeters.    Depth: 0 centimeters  Open Surface Area:  0.01 cm  Undermining: No  Tunneling:  No  Peri-Wound Erythema: No  Skin Color:  Dry  skin  Open Wound Care: Care provided per protocol.     Open wound. drain site  Length: 0.1 centimeters    Width: 0.1 centimeters.    Depth: 0 centimeters  Open Surface Area:  0.01 cm  Undermining: No  Tunneling:  No  Peri-Wound Erythema: No  Skin Color:  Dry  skin  Open Wound Care: Care provided per protocol.    Signed by: Theodoro Kalata, RN 10/03/2013 9:00:00 PM

## 2013-10-04 NOTE — Rehab Evaluation ([facility]) (Signed)
[name]  MRN: [id number]  Account: [id number]  Session Start: 10/04/2013 9:00:00 AM  Session Stop: 10/04/2013 10:00:00 AM    Occupational Therapy  Inpatient Rehabilitation Evaluation    Rehab Diagnosis: Gait Disturbance  Demographics:            Age: 40Y            Gender: Female  Primary Language: english    Past Medical History: L. total knee replacement  History of Present Illness: This 77 y.o. year old female with history of R TKA  on 08/25/13 by Dr. Ahmed Prima was readmitted to [facility] on 09/05/13 and underwent  revisions on 09/05/13, 12/8 /14, 09/22/13 with muscle flap/ skin grafting by  plastic surgery on 09/22/13. The patient is to keep leg extended ; current WB  status is 10 %    Wound culture was positive for Proteus. Per ID ,continue Cipro through 10/27/13   Date of Onset: 09-05-13   Date of Admission: 10/03/2013 6:43:00 PM  Premorbid Functional Level: Pt was indep with all mobility prior to admission,  no AD  Social/Educational History: Pt is retired  Surveyor, minerals: Pt lives in [facility] with daughter, 3 STE front door with full FOS  to basement level apt/living area, 8 STE directly to basement. Pt has chair lift  in home however unable to use at this time due to R knee ext restriction.  Pt  has BSC, raised toilet seat, WIS with shower chair.  No grab bars.    Medications and Allergies: Significant rehabilitation considerations:   See Epic  Rehabilitation Precautions/Restrictions:   fall precautions, infection control, R knee EXT at all times, 10% WB RLE    SUBJECTIVE  Patient Report: "A shower would be excellent!"  Patient/Caregiver Goals:  Patient's functional goals: I want to get better so  that I can go home.  Pain: Patient currently has pain.  Location: R knee  Type: Acute  Quality: Shooting.  Stabbing.  Throbbing.  Pain Scale: Numeric.  Patient reports a pain level of 3 out of 10.  Patient's acceptable level of pain 1 out of 10.   Interferes with physical activity.  Pain is alleviated by: Meds, rest,  repositioning  Pain is exacerbated by: physical activity, knee position Patient medicated. RN  brought pain meds at end of session.  Pain Reassessment: RN brough meds at end of session; pain not reassessed by this  therapist after medication.    OBJECTIVE  General Observation: Pt seated on toilet at beginning of session, having  transferred with nursing for BM.  Pt agreeable to OT ADL session.  Understanding of Current Condition: Patient is aware that she has right knee  infection and has had multiple surgeries. She is aware that she is here for  acute rehab inpatient services.  Vital Signs:                        Current Value                Previous Value  Vitals  BP Systolic          -                            126/54  Pulse                -  65  Respirations         -                            18    Cognition: Pt with WFL basic problem solving and memory; needs some minimal  assistance for planning novel tasks.  Vision:  WFL  Perception: WFL    Upper Extremity Status   Tone: WFL   Range of Motion: Pt with WFL AROM all joints BUEs   Fine Motor: WFL   Other: Pt with decreased functional mobility and self care independence d/t R  knee weight bearing and ROM restrictions, decreased dynamic standing balance and  pain.    Strength/Motor Control: Pt with 4/5 strength BUEs.  Sensation: WFL grossly in BUE    Interventions: None provided today.    Interdisciplinary Educational Needs and Learning Preferences:       Learning Preference: The patient's preferred learning method is:  Explanation.  The patient's preferred learning method is: Demonstration.       Barriers to Learning: No barriers.       Learning Needs: Precautions.  Pain management.  Plan of care.  Rehabilitation techniques and procedures.  Safety.  Functional activities/mobility.  Equipment.    Education Provided: Plan of care. Rehab techniques and procedures. Pt educated  on rehab daily routine, d/c planning process, and therapy  goals. Safety issues  and interventions. Fall protocol. Use of adaptive devices. Supervision  requirements.       Audience: Patient.       Mode: Explanation.  Demonstration.       Response: Verbalized understanding.  Needs reinforcement.    ASSESSMENT  Summary of Deficits and Prognosis: Pt is limited at this time by decreased  strength/endurance, decreased balance d/t WB and ROM restrictions, and overall  decline in self care function.  Pt would benefit from intensive skilled OT  services to address this deficits and maximize functional I and safett prior to  d/c home.  Rehab Potential: Able to participate in an intensive inpatient interdisciplinary  rehabilitation program, Good family/social support, Good premorbid functional  status, Living in the community premorbidly, Motivated  Barriers to Progress/Discharge: Architectural barriers in home    Functional Measures    EATING: Eating Score = 5. Patient is supervision/set-up for eating, requiring:  Stand by assistance.  Patient requires the following assistive device(s) No assistive devices were  required.    GROOMING: Grooming Score = 5. Patient is supervision/set-up for grooming,  requiring: Stand by assistance.  Verbal cuing, prompting, or instructing.  Initial preparation.  Patient requires the following assistive device(s) No assistive devices were  required.    BATHING: Patient bathed in shower. Patient requires no physical assistance for  washing, rinsing, and drying the right arm. Patient requires no physical  assistance for washing, rinsing, and drying the left arm. Patient requires no  physical assistance for washing, rinsing, and drying the chest. Patient requires  no physical assistance for washing, rinsing, and drying the abdomen. Patient  requires no physical assistance for washing, rinsing, and drying the perineal  area. Patient requires total assistance for washing, rinsing, or drying the  buttocks. Patient requires no physical assistance for  washing, rinsing, and  drying the right upper leg. Patient requires no physical assistance for washing,  rinsing, and drying the left upper leg. Patient requires total assistance for  washing, rinsing, or drying the right lower leg, including the foot. Patient  requires  moderate assistance for washing, rinsing, or drying the left lower leg,  including the foot. Patient performs 70 % of bathing tasks. Bathing Score = 3,  Moderate Assistance.  Patient requires the following assistive device(s): Grab bar/arm rest to  maintain balance.  Hand held shower.    UPPER BODY DRESSING: Patient requires no physical assistance for threading the  right arm through the undergarment. Patient requires no physical assistance for  threading the left arm through the undergarment. Patient requires minimal  physical assistance for bringing undergarment around body and fastening or  placing undershirt overhead. Patient requires no physical assistance for pulling  the undergarment in to place or pulling undershirt down over the trunk. Patient  requires no physical assistance for threading the right arm through the garment  (shirt/sweater). Patient requires no physical assistance for threading the left  arm through the garment (shirt/sweater). Patient requires no physical assistance  for pulling an over-head-garment over head or pulling front-fastening-garment  around back. Patient requires minimal physical assistance for pulling an  over-head-garment down the trunk or adjusting/fastening together a  front-fastening-garment. Patient performs 93.75 % of upper body dressing tasks.  Upper Body Dressing Score = 4, Minimal Assistance.  Patient requires the following assistive device(s):  No assistive devices were  required.    LOWER BODY DRESSING: Patient requires total assist for donning and/or doffing  undergarment threading right leg. Patient requires total assist for donning  and/or doffing undergarment threading left leg. Patient requires  total assist  for donning and/or doffing undergarment over hips and adjusting fasteners.  Patient requires total assistance for donning and/or doffing pants/skirt  threading right leg. Patient requires total assistance for donning and/or  doffing pants/skirt threading left leg. Patient requires total assistance for  donning and/or doffing pants/skirt over hips and adjusting fastener. Patient  requires total assistance for donning and/or doffing right sock. Patient  requires total assistance for donning and/or doffing left sock. Donning and/or  doffing right shoe was not observed for this patient. Donning and/or doffing  left shoe was not observed for this patient. Patient performs 0 % of lower body  dressing tasks. -  24% of lower body dressing tasks. Lower Body Dressing  Score  = 1, Total Assistance.  Patient requires the following assistive device(s): No assistive devices were  required.    TOILETING: Patient requires maximal assistance for adjusting clothing before  using a toilet, commode, bedpan, or urinal. Patient requires moderate assistance  for hygiene. Patient requires total assistance for adjusting clothing after  using a toilet, commode, bedpan, or urinal. Patient performs 0 -  24% of  toileting tasks.  Toileting Score = 1, Total Assistance.  Patient requires the following assistive device(s):  Adaptive device to maintain  balance.  Grab bar.    TRANSFER TOILET: Toilet Transfer Score = 3.  Patient performs 50-74% of effort  and requires moderate assistance (some lifting) for transferring to and from the  toilet/commode.  Patient requires the following assistive device(s):  Grab bars.    TRANSFER SHOWER: Shower Transfer Score = 3.  Patient performs 50-74% of effort  and requires moderate assistance (some lifting) for transferring to and from the  shower. Grab bars.    COMPREHENSION: Auditory comprehension is the usual mode. Comprehension Score =  6, Modified Independence.  Patient comprehends  complex/abstract information in  their primary language, requiring:    EXPRESSION: Vocal expression is the usual mode. Expression Score = 6, Modified  Independent.  Patient expresses complex/abstract information in their  primary  language with only mild difficulty with tasks.    SOCIAL INTERACTION: Social Interaction Score = 6, Modified Independent.  Patient  is modified independent for social interaction, requiring:    PROBLEM SOLVING: Patient does not make appropriate decisions in order to solve  complex problems without assistance from a helper. Problem Solving Score = 5,  Supervision.  Patient makes appropriate decisions in order to solve routine  problems with directing only under stressful or unfamiliar conditions, but no  more than 10% of the time, for the following behavior(s): Making decisions,  Recognizing risks    MEMORY: Memory Score = 5, Supervision.  Patient recognizes and remembers with  prompting only under stressful or unfamiliar conditions, but no more than 10% of  the time, for the following behavior(s): None    IRFPAI Goals    Eating: 7  Grooming: 6  Upper Body Dressing: 6  Bathing; 5  Lower Body Dressing: 5  Toileting: 6  Mode of Bathing: S  Tub/Shower: 5  Toilet Transfers: 6    Short Term Goals:  Time frame to achieve short term goal(s):   1 week       1. Patient will perform toileting transfer with CGA and good maintenance of  RLE precautions.       2. Pt will perform LB dressing with AE PRN and MOD A.  Long Term Goals:   Time frame to achieve long term goal(s):   2 weeks       1. Patient will perform toileting transfer and task with MOD I.       2. Patient will perform UB dressing with MOD I and LB dressing with PRN AE  and SBA.       3. Patient will stand for at least 3 minutes in order to assist with  standing ADL tasks.       4. Patient will perform bathing transfer and task with SBA.    Risks/Benefits of Rehabilitation Discussed with Patient/Caregiver: Yes.  Recommendations/Goals for  Rehabilitation Discussed with Patient/Caregiver: Yes.    PLAN  Occupational Therapy Plan: Occupational Therapy is recommended.  Recommended Frequency/Duration/Intensity: 1-2 hours/day, 5-7days/week, for 14  days s/p eval on 10/05/13.  Activities Contributing Toward Care Plan: Therapeutic exercise, therapeutic  activity, ADL/transfer training, balance training, endurance training, pt/family  education    Team Care Plan  Please review Integrated Patient View Care Plan Flowsheet for Team identified  Problems, Interventions, and Goals.    Identified problems from team documentation:  Problem: Impaired Mobility  Mobility: Primary Team Goal: Pt will be indep with all bed mobility and  transfers with LRAD, indep with walking at least 23ft iwth LRAD, mod-max A for 4  steps with LRAD and HR/Active    Problem: Impaired Pain Management  Pain Mgmt: Primary Team Goal: Team will collaborate and work efficiently and  effectively in achieving manageable pain levels while participating in acute  rehab./    Identified problems from this assessment:     Self Care Management : Patient will perform toileting transfer and task with  MOD I.  Caregiver will demo safe supervision for bathing and LB dressing  according to family training.    Discipline:  Occupational Therapy    3 Hour Rule Minutes: 60 minutes of OT treatment this session count towards  intensity and duration of therapy requirement. Patient was seen for the full  scheduled time of OT treatment this session.    Signed by: Tyrone Sage, OTR/L 10/04/2013 10:00:00 AM

## 2013-10-04 NOTE — Rehab Evaluation ([facility]) (Addendum)
Corrected 10/04/2013 12:23:16 PM    NAME: PAISLI SILFIES  MRN: [id number]  Account: [id number]  Session Start: 10/04/2013 11:00:00 AM  Session Stop: 10/04/2013 12:00:00 PM    Physical Therapy  Inpatient Rehabilitation Eval    Rehab Diagnosis: Gait Disturbance  Demographics:            Age: 28Y            Gender: Female  Primary Language: english  Past Medical History: L. total knee replacement  History of Present Illness: This 77 y.o. year old female with history of R TKA  on 08/25/13 by Dr. Ahmed Prima was readmitted to [facility] on 09/05/13 and underwent  revisions on 09/05/13, 12/8 /14, 09/22/13 with muscle flap/ skin grafting by  plastic surgery on 09/22/13. The patient is to keep leg extended ; current WB  status is 10 %    Wound culture was positive for Proteus. Per ID ,continue Cipro through 10/27/13   Date of Onset: 09-05-13   Date of Admission: 10/03/2013 6:43:00 PM  Premorbid Functional Level: Pt was indep with all mobility prior to admission,  no AD  Social/Education History: Pt is retired  Surveyor, minerals: Pt lives in [facility] with daughter, 3 STE front door with full FOS  to basement level apt/living area, 8 STE directly to basement. Pt has chair lift  in home however unable to use at this time due to R knee ext restriction.    Medications and Allergies: Significant rehabilitation considerations:   See Epic  Rehabilitation Precautions/Restrictions:   fall precautions, infection control, R knee EXT at all times, 10% WB RLE    SUBJECTIVE  Patient/Caregiver Goals:  Patient's functional goals: I want to get better so  that I can go home.  Pain: Patient currently has pain.  Location: R knee  Type: Acute  Quality: Burning.  Pain Scale: Numeric.  Patient reports a pain level of 8 out of 10.  Patient's acceptable level of pain 3 out of 10.   Interferes with physical activity.  Pain is alleviated by: Resting, sitting  Pain is exacerbated by: standing, depedent positions Patient medicated.  Repositioned patient.  Pain  Reassessment:  Response to Pain Intervention: Pt reports improved pain with sitting v. standing    Post Intervention Pain Quality:  Burning.  Patient Reports Post Intervention Pain Level of: 4 out of 10  Pain Acceptable: Yes    OBJECTIVE  General Observations: Pt received sitting in w/c, noted edema of RLE  Understanding of Current Condition: Patient is aware that she has right knee  infection and has had multiple surgeries. She is aware that she is here for  acute rehab inpatient services.    Sensation: WFL  Range of Motion: LLE and BUE WFL, R knee restricted to ext only  Strength/Motor Control: LLE WFL, BUE WFL, RLE grossly 3-/5  Balance: Pt sitting balance is good, standing static nad dynamic balance is poor  due to 10 % WB restriction    Therapeutic/Functional Mobility:            Bed Mobility: Pt requires min A for RLE supine to and from sitting,            See Functional Measures for details  Interventions: None provided today.    Interdisciplinary Educational Needs and Learning Preferences:       Learning Preference: The patient's preferred learning method is:  Explanation.  The patient's preferred learning method is: Demonstration.  The patient's preferred learning method is: Programme researcher, broadcasting/film/video.  Barriers to Learning: Acuity of illness.  Mobility.       Learning Needs: Precautions.  Pain management.  Plan of care.  Rehabilitation techniques and procedures.  Safety.  Respiratory function.  Functional activities/mobility.  Equipment.  Leisure/vocational.  Medical management.    Education Provided: Precautions. Plan of care. Pain management. Pain scale.  Activities of daily living. Bed mobility. Functional transfers. Gait. Safety.  Equipment.       Audience: Patient.       Mode: Explanation.  Demonstration.       Response: Applied knowledge.  Verbalized understanding.  Demonstrated skill.  Needs practice.    ASSESSMENT  Summary of Deficits and Prognosis: Mrs. Kaus presents to physical therapy  with  impaired ROM, strength, functional endurance and balance related to her recent R  TKA and multiple revision surgeries. These impairments severely impact her  ability to complete MRADLs and especially negotiate stairs in order for her to  return home safely with her family. Mrs. Mcglothlin will benefit from skilled  physical to address these impairments so she may retunr home safely with her  family  Rehab Potential: Able to participate in an intensive inpatient interdisciplinary  rehabilitation program, Good family/social support, Good premorbid functional  status, Good premorbid medical status, Living in the community premorbidly,  Motivated  Barriers to Progress/Discharge: Architectural barriers in home, Poor activity  tolerance, Poor pain tolerance, Functional status, Medical condition    Functional Measures      TRANSFERS BED/CHAIR/WHEELCHAIR: Bed/chair/wheelchair Transfer Score = 4.  Patient performs 75% or more of effort and minimal assistance (little/incidental  help/lifting of one limb/steadying) for transferring to and from the  bed/chair/wheelchair, requiring: Help to/from supine only. Contact guard.  Patient requires the following assistive device(s): Walker.  Arm rest. Pt requires CGA for transfers with RW    LOCOMOTION WHEELCHAIR:   Wheelchair locomotion was observed using a manual wheelchair. Wheelchair  Distance Scale = 2.  Distance traveled in wheelchair is 50 -149 feet. Wheelchair  Score = 2.  Patient is able to go at least 50 feet (household distance) in  wheelchair but requires assistance. Patient was able to propel a distance of  50  feet in a wheelchair.  No assistive devices required. Pt uses BUE however  fatigues after 76ft    LOCOMOTION WALK:   Walk Distance Scale = 1.  Distance walked is less than 50 feet. Walk Score = 1.   Incidental assistance with lifting, contact guard or steadying was provided.  Patient performs 75% or more of effort and requires minimal contact assistance  for  walking. Patient walked a distance of 9 feet. Rolling walker. Pt attempts  hopping technique with walking, fatigues quickly and reports burning pain in RLE  with prolonged standing.    LOCOMOTION STAIRS: Stairs did not occur because activity was unsafe for patient.  Unable to safely maintain 10% WB    PAI Expected Mode of Locomotion at Discharge: The expected mode of most  frequently used locomotion, at discharge, is expected to be walking.    IRFPAI Goals    Bed, Chair, Wheelchair Transfers: 6  Mode of Locomotion: W  Walk: 5  Stairs: 2    Short Term Goals:  Not applicable.  Long Term Goals:   Time frame to achieve long term goal(s): 10-14 days from eval  on 10/04/13       1. Pt will be indep with all bed mobility to promote indep morning routine       2.  Pt will indep with LRAD with all transfers for safe negotiation of her  home       3. Pt will wlak at least 4ft with LRAD and indep to access all areas of  her home       4.  Pt will perform car transfers with LRAD and indep in order to attend MD  appts as needed       5. Pt will negotiate at least 4 steps with LRAD and mod-max A for  safety/steadying in order to enter and exit her home safely    Risks/Benefits of Rehabilitation Discussed with Patient/Caregiver: Yes.  Recommendations/Goals for Rehabilitation Discussed with Patient/Caregiver: Yes.    PLAN  Physical Therapy Plan: Physical Therapy is recommended.  Recommended Frequency/Duration/Intensity: 1-2 hrs/day, 5-6 days/wk for 2 weeks  from evaluation on 10/04/13  Activities Contributing Toward Care Plan: Bed mobility training, transfer  training, balance training, gait training, stair training, therapeutic  strengthening exercises, therapeutic activities, ROM exercises, endurance  activities, family/caregiver training, instruction in home exercise program,  education for home modifications, procurement of DME.    Team Care Plan  Please review Integrated Patient View Care Plan Flowsheet for Team  identified  Problems, Interventions, and Goals    Identified problems from team documentation:  Problem: Impaired Pain Management  Pain Mgmt: Primary Team Goal: Team will collaborate and work efficiently and  effectively in achieving manageable pain levels while participating in acute  rehab./    Identified problems from this assessment:     Mobility : Pt will be indep with all bed mobility and transfers with LRAD,  indep with walking at least 43ft iwth LRAD, mod-max A for 4 steps with LRAD and  HR    Discipline:  Physical Therapy    3 Hour Rule Minutes: 60 minutes of PT treatment this session count towards  intensity and duration of therapy requirement. Patient was seen for the full  scheduled time of PT treatment this session.    Signed by: Candida Peeling, PT 10/04/2013 12:00:00 PM

## 2013-10-04 NOTE — Rehab Progress Note ([facility]) (Signed)
[name]  MRN: [id number]  Account: [id number]  Session Start: 10/04/2013 1:00:00 PM  Session Stop: 10/04/2013 2:00:00 PM    Physical Therapy  Inpatient Rehabilitation Progress Note - Brief    Rehab Diagnosis: Gait Disturbance  Demographics:            Age: 42Y            Gender: Female  Rehabilitation Precautions/Restrictions:   fall precautions, infection control, R knee EXT at all times, 10% WB RLE    SUBJECTIVE  Patient Report: " I have to keep my knee straight at all times)  Pain: Patient currently has pain.  Patient reports a pain level of 2 out of 10. Patient medicated.    OBJECTIVE  General Observations: Pt received sitting in w/c, noted edema of RLE    Interventions:       Therapeutic Activities:  Son present for PT session  willing to assist and  very attentive.  Transfer with RW 10% WB status maintained throughout session.  Pt able to get both LEs onto the mat with Supervision assist.   exercises for  right Knee/ LE ( ankle pumps, glut and quad sets add/abd) 20x each.  Pt was then  able to walk 23 and  33 feet with barriatric RW and w/c follow cg assist.  Pt  propelled w/c back towards her romm 88 feet with UEs and min cues for direction.   Pt remained up in the w/c after PT session with RN aware and son in the room.    ASSESSMENT  Pt with good support system from son,  and should be able to meet goals set by  evaluating PT    PLAN  Continued Physical Therapy is recommended.  Recommended Frequency/Duration/Intensity: 1-2 hrs/day, 5-6 days/wk for 2 weeks  from evaluation on 10/04/13  Activities Contributing Toward Care Plan: Bed mobility training, transfer  training, balance training, gait training, stair training, therapeutic  strengthening exercises, therapeutic activities, ROM exercises, endurance  activities, family/caregiver training, instruction in home exercise program,  education for home modifications, procurement of DME.    3 Hour Rule Minutes: 60 minutes of PT treatment this session count  towards  intensity and duration of therapy requirement. Patient was seen for the full  scheduled time of PT treatment this session.    Signed by: Roseanna Rainbow, LPTA 10/04/2013 2:00:00 PM

## 2013-10-04 NOTE — Rehab Progress Note ([facility]) (Signed)
[name]  MRN: [id number]  Account: [id number]  Session Start: 10/04/2013 12:00:00 AM  Session Stop: 10/04/2013 12:00:00 AM    SEVERE SEPSIS SCREEN  INFECTION:  Patient has documented infection. Is on antibiotic therapy (not  prophylaxis).    SYSTEMATIC IMFLAMMATORY RESPONSE SYNDROME (SIRS):  Patient  has no indication of  systematic inflammatory response syndrome (SIRS). Negative Sepsis Screen.  If you are unable to assess a system's dysfunction because you do not have labs,  or the labs you have are not current (within 24 hours), call physician and  request and order for the lab tests needed.    Signed by: Theodoro Kalata, RN 10/04/2013 7:58:00 PM

## 2013-10-05 NOTE — Rehab Progress Note ([facility]) (Signed)
[name]  MRN: [id number]  Account: [id number]  Session Start: 10/05/2013 12:00:00 AM  Session Stop: 10/05/2013 12:00:00 AM    Rehabilitation Nursing  Inpatient Rehabilitation Shift Assessment    Rehab Diagnosis: Gait Disturbance  Demographics:            Age: 77Y            Gender: Female  Primary Language: english    Date of Onset:  09-05-13  Date of Admission: 10/03/2013 6:43:00 PM    Rehabilitation Precautions Restrictions:   fall precautions, infection control, R knee EXT at all times, 10% WB RLE    Patient Report: hello  Pain: Patient currently without complaints of pain.  Pain Reassessment: Pain was not reassessed as no pain was reported.  Patient/Caregiver Goals:  I want to get better so that I can go home.    NEURO  Orientation/Awareness: Alert and Oriented x4.  Speech: Clear.  Behavior: Cooperative.    MEDICATIONS  IV Access: No IV access.  Dialysis Access: Patient does not have dialysis access.      Elopement Risk Level Assessment Tool  Patient Criteria: Patient is not capable of leaving the unit.  Assessment is not  applicable.      RISK ASSESSMENT FOR FALLS/INJURY    MENTAL STATUS CRITERIA:   0 - None identified.  MENTAL STATUS TOTAL: 0    AGE CRITERIA:   77 - 83-34 years old  AGE TOTAL: 1    ELIMINATION CRITERIA:   3 - Toileting with Assistance.   ELIMINATION TOTAL: 3    HISTORY OF FALLS CRITERIA:   2 - Unknown History.  HISTORY OF FALLS TOTAL: 2    MEDICATIONS CRITERIA:   2 or more High Risk Medications (*see list below)   MEDICATIONS TOTAL: 2    PHYSICAL MOBILITY CRITERIA:   1 - Weakness/impaired physical mobility.   3 - Decreased balance reaction.   PHYSICAL MOBILITY TOTAL: 4    FALLS RISK ASSESSMENT TOTAL: 12    Patient's Fall Risk: TOTAL SCORE >10: High Risk    Falls Interventions: Clutter removed and clear path to BR.  Call bell, phone, glasses, etc within reach.  Assistive devices at [facility].  Bed alarm    NUTRITION  Diet:  Type: Regular.  Food Consistency: Regular.  Liquid Consistency:  Thin.      CARDIOVASCULAR     Right Lower Extremity  Nail Bed Color: Pink.   edema to right knee   Left Lower Extremity  Nail Bed Color: Pink.   +1 edema  Pulses:   Apical Pulse: Regular. Strong. Rate is .   Patient does not have a pacemaker.   Patient does not have a defibrillator.    CARDIOPULMONARY  Lung Sounds:   Upper lobes. Clear.   Lower lobes. Clear.  Type of Respirations: Regular.  Cough: No cough noted.  Respiratory Support: The patient does not require any respiratory support.  Respiratory Equipment: None.    INTEGUMENTARY  Skin:  Temperature: Warm  Turgor: Normal for age  Moisture: Dry  Color of skin: Normal for Race/Ethnicity  Capillary Refill: Less than 3 seconds  Wounds/Incisions:     Wound/incision not assessed this shift.   wound care done by am nurse  Braden Scale for Predicting Pressure Sore Risk: Sensory Perception: No  impairment  Moisture: Rarely moist  Activity: Chairfast  Mobility: Slightly limited  Nutrition: Adequate  Friction and Shear: No apparent problem  Braden Score: 19  Level of Risk: No risk (  19-23). Will reassess every shift.    GASTROINTESTINAL  Abdomen: Soft. Nontender. Obese.  Bowel Sounds:  Active bowel sounds audible in all four quadrants.  Date of Last Bowel Movement:  10-05-13   No Problems/Complaints with Bowel Elimination Assessed.    GENITOURINARY  Current Bladder Pattern: Continent  Color:  Yellow   Patient denies problems with urination and/or catheter.    MUSCULOSKELETAL  Upper Body:  Lower Body: RLE- RIGHT LOWER EXTREMITY 10% WB in extension. NO bending knee    Functional Measures      BLADDER MANAGEMENT - LEVEL OF ASSIST: Bladder Score = 6. Patient is modified  independent for bladder management requiring: Bucket of bedside commode    BLADDER ACCIDENTS THIS SHIFT:  0 . Patient has not had an accident this  assessment and did not require medications or devices.    BOWEL MANAGEMENT - LEVEL OF ASSIST: Bowel Score = 6.  Patient is modified  independent for bowel  management.  Patient did not have bowel movement.  Medication/intervention was provided.    BOWEL ACCIDENTS THIS SHIFT: 0 . Patient has not had an accident, but used a  non-natural laxative.    Education Provided:    Education Provided: Medication. Name and dosage. Administration. Purpose. Side  Effects. Interaction.       Audience: Patient and Caregiver       Mode: Explanation.  Teacher, English as a foreign language provided.       Response: Verbalized understanding.  Needs reinforcement.    Discharge: Patient is not being discharged at this time.    Long Term Goals: Within 2 weeks, patient will be able verbalize 2/10 on pain  scale on a consistent basis for effective pain management.  Within in 2 weeks, patient will be able to state medication, indications, and  possible side effects.  Short Term Goals: Within 2 weeks, patient will be able to state medications  being taken along with indications, and possible side effects. - Goal Met  Within 2 weeks, patient will have effective pain management using medication as  prescribed, and verbalize 2/10 pain scale.    PROGRESS TOWARD GOALS: in progress    PLAN: Nursing Specific Interventions  Medication Management. Pain Management. Skin Management. Wound Management.  Continue with the current Nursing Plan of Care.    TEAM CARE PLAN  Identified problems from team documentation:  Problem: Impaired Mobility  Mobility: Primary Team Goal: Pt will be indep with all bed mobility and  transfers with LRAD, indep with walking at least 76ft iwth LRAD, mod-max A for 4  steps with LRAD and HR/Active    Problem: Impaired Pain Management  Pain Mgmt: Primary Team Goal: Team will collaborate and work efficiently and  effectively in achieving manageable pain levels while participating in acute  rehab./    Problem: Impaired Self-care Mgmt/ADL/IADL  Self Care: Primary Team Goal: Patient will perform toileting transfer and task  with MOD I.  Caregiver will demo safe supervision for bathing and LB  dressing  according to family training./    Add/Update Problems from this assessment:  No updates at this time.    Please review Integrated Patient View Care Plan Flowsheet for Team identified  Problems, Interventions, and Goals.    Signed by: Theodoro Kalata, RN 10/05/2013 10:16:00 PM

## 2013-10-05 NOTE — Rehab Progress Note ([facility]) (Signed)
NAMEBRYCE KIMBLE  MRN: [id number]  Account: [id number]  Session Start: 10/05/2013 12:00:00 AM  Session Stop: 10/05/2013 12:00:00 AM    SEVERE SEPSIS SCREEN  INFECTION:  Patient has documented infection. Is on antibiotic therapy (not  prophylaxis).    SYSTEMATIC IMFLAMMATORY RESPONSE SYNDROME (SIRS):  Patient  has no indication of  systematic inflammatory response syndrome (SIRS). Negative Sepsis Screen.  If you are unable to assess a system's dysfunction because you do not have labs,  or the labs you have are not current (within 24 hours), call physician and  request and order for the lab tests needed.    Signed by: Stephanie Acre, RN 10/05/2013 8:00:00 AM

## 2013-10-05 NOTE — Progress Notes (Signed)
IRF Physiatry Attending Brief Note  [x] No new events    Subjective:  Feels well and is without complaints.    Objective:  Vitals: BP 140/70  Pulse 68  Temp 97.5 F (36.4 C)  Resp 18  SpO2 96%    Appears well.  In no apparent distress.  Sclerae anicteric.   Moist mucous membranes.  Alert with normal mood and affect    New labs   Results     Procedure Component Value Units Date/Time    Urine culture [[id number]] Collected:10/03/13 2259    Specimen Information:Urine / Urine, Clean Catch Updated:10/05/13 0928    Narrative:    ORDER#: [id number]                                    ORDERED BY: Fabio Bering  SOURCE: Urine, Clean Catch                           COLLECTED:  10/03/13 22:59  ANTIBIOTICS AT COLL.:                                RECEIVED :  10/04/13 11:58  Culture Urine                              PRELIM      10/05/13 09:28   +  10/05/13   10,000 - 30,000 CFU/ML Gram negative rod               Identification and susceptibility to follow        MRSA culture [[id number]] Collected:10/03/13 2259    Specimen Information:Body Fluid / Nares and Throat Updated:10/04/13 1201          Assessment/Plan: 77 y.o. female with Debility    Continue comprehensive intensive inpatient rehab program. Medically stable.  Continue current management.  Hold off on additional Abx for UTi pending sensitivities- is already on Cipro.

## 2013-10-05 NOTE — Rehab Progress Note ([facility]) (Signed)
NAMEAYUMI WANGERIN  MRN: [id number]  Account: [id number]  Session Start: 10/05/2013 12:00:00 AM  Session Stop: 10/05/2013 12:00:00 AM    SEVERE SEPSIS SCREEN  INFECTION:  Patient has documented infection. Is on antibiotic therapy (not  prophylaxis).    SYSTEMATIC IMFLAMMATORY RESPONSE SYNDROME (SIRS):  Patient  has no indication of  systematic inflammatory response syndrome (SIRS). Negative Sepsis Screen.  If you are unable to assess a system's dysfunction because you do not have labs,  or the labs you have are not current (within 24 hours), call physician and  request and order for the lab tests needed.    Signed by: Theodoro Kalata, RN 10/05/2013 10:09:00 PM

## 2013-10-05 NOTE — Rehab Progress Note ([facility]) (Signed)
[name]  MRN: [id number]  Account: [id number]  Session Start: 10/05/2013 12:00:00 AM  Session Stop: 10/05/2013 12:00:00 AM    Rehabilitation Nursing  Inpatient Rehabilitation Shift Assessment    Rehab Diagnosis: Gait Disturbance  Demographics:            Age: 77Y            Gender: Female  Primary Language: english    Date of Onset:  09-05-13  Date of Admission: 10/03/2013 6:43:00 PM    Rehabilitation Precautions Restrictions:   fall precautions, infection control, R knee EXT at all times, 10% WB RLE    Patient Report: "Good morning"  Pain: Patient currently without complaints of pain.  Pain Reassessment: Pain was not reassessed as no pain was reported.  Patient/Caregiver Goals:  I want to get better so that I can go home.    NEURO  Orientation/Awareness: Alert and Oriented x3.  Speech: No deficits noted at this time.  Clear.  Behavior: Cooperative.    MEDICATIONS  IV Access: No IV access.  Dialysis Access: Patient does not have dialysis access.      Elopement Risk Level Assessment Tool  Patient Criteria: Patient is not capable of leaving the unit.  Assessment is not  applicable.      RISK ASSESSMENT FOR FALLS/INJURY    MENTAL STATUS CRITERIA:   0 - None identified.  MENTAL STATUS TOTAL: 0    AGE CRITERIA:   77 - 61-46 years old  AGE TOTAL: 1    ELIMINATION CRITERIA:   3 - Toileting with Assistance.   ELIMINATION TOTAL: 3    HISTORY OF FALLS CRITERIA:   2 - Unknown History.  HISTORY OF FALLS TOTAL: 2    MEDICATIONS CRITERIA:   2 or more High Risk Medications (*see list below)   MEDICATIONS TOTAL: 2    PHYSICAL MOBILITY CRITERIA:   3 - Decreased balance reaction.   1 - Weakness/impaired physical mobility.   PHYSICAL MOBILITY TOTAL: 4    FALLS RISK ASSESSMENT TOTAL: 12    Patient's Fall Risk: TOTAL SCORE >10: High Risk    Falls Interventions: Clutter removed and clear path to BR.  Call bell, phone, glasses, etc within reach.  Hourly toileting/safety checks between 6am and 10pm, then every 2 hours.  Yellow "high  risk" patient identification in place: wrist band, socks, chart  sticker, door sign.  Assistive devices at [facility].  Family at bedside.  Bed alarm    NUTRITION  Diet:  Type: Regular.  Food Consistency: Regular.  Liquid Consistency: Thin.      CARDIOVASCULAR     Right Lower Extremity  Nail Bed Color: Pink.   Edema noted around Right knee surgical incision site, Elevated on a pillow  while in bed and wheelchair.   Left Lower Extremity  Nail Bed Color: Pink.   No edema or redness present.  Pulses:   Apical Pulse: Regular. Strong. Rate is 64 .   Patient does not have a pacemaker.   Patient does not have a defibrillator.    CARDIOPULMONARY  Lung Sounds:   Upper lobes. Clear.  Type of Respirations: Regular.  Cough: No cough noted.  Respiratory Support: The patient does not require any respiratory support.  Respiratory Equipment: None.    INTEGUMENTARY  Skin:  Temperature: Warm  Turgor: Normal for age  Moisture: Dry  Color of skin: Normal for Race/Ethnicity  Capillary Refill: Less than 3 seconds  Wounds/Incisions:     Surgical Incision: Right knee  anterior side incision Length: 21 centimeter(s)  with staples and sutures. No signs of infection.  Drainage: Incision without drainage.  Odor:  No  Incision Care: Per protocol.     Surgical Incision: Right knee posterior incision Length: 18 centimeter(s) with  staples. No signs of infection.  Drainage: Incision without drainage.  Odor:  No  Incision Care: O.T.A        drainage site incisions to right lower leg, draining minimal amount, site  cleaned with NS and pat dry, covered with dry dressing.  Braden Scale for Predicting Pressure Sore Risk: Sensory Perception: No  impairment  Moisture: Rarely moist  Activity: Chairfast  Mobility: Slightly limited  Nutrition: Adequate  Friction and Shear: Potential problem  Braden Score: 18  Level of Risk: At risk (15-18)   Pressure Relief every 30 minutes while in wheelchair   Assist patient to the bathroom every 2 hours   Turn patient every 2  hours    GASTROINTESTINAL  Abdomen: Soft. Nontender.  Bowel Sounds:  Active bowel sounds audible in all four quadrants.  Date of Last Bowel Movement:  10/04/13   No Problems/Complaints with Bowel Elimination Assessed.    GENITOURINARY  Current Bladder Pattern: Continent  Color:  Yellow   Patient denies problems with urination and/or catheter.    MUSCULOSKELETAL  Upper Body:  Lower Body: RLE- RIGHT LOWER EXTREMITY 10% WB in extension. NO bending knee    Functional Measures    EATING: Eating Score = 5. Patient is supervision/set-up for eating, requiring:  Opening containers.  Buttering bread.  Patient requires the following assistive device(s) No assistive devices were  required.    TOILETING: Patient requires moderate assistance for adjusting clothing before  using a toilet, commode, bedpan, or urinal. Patient requires maximal assistance  for hygiene. Patient requires moderate assistance for adjusting clothing after  using a toilet, commode, bedpan, or urinal. Patient performs 0 -  24% of  toileting tasks.  Toileting Score = 1, Total Assistance.  Patient requires the following assistive device(s):  Adaptive device to maintain  balance.  Grab bar.    BLADDER MANAGEMENT - LEVEL OF ASSIST: Bladder Score = 7. Patient is completely  independent for bladder management. There are no activity limitations.    BLADDER ACCIDENTS THIS SHIFT:  0 . Patient has not had an accident this  assessment and did not require medications or devices.    BOWEL MANAGEMENT - LEVEL OF ASSIST: Bowel Score = 6.  Patient is modified  independent for bowel management.  Patient did not have bowel movement.  Medication/intervention was provided.    BOWEL ACCIDENTS THIS SHIFT: 0 . Patient has not had an accident, but used a  stool softener.    TRANSFER TOILET: Toilet Transfer Score = 3.  Patient performs 50-74% of effort  and requires moderate assistance (some lifting) for transferring to and from the  toilet/commode.  Patient requires the following  assistive device(s):  Grab bars.    Education Provided:    Education Provided: Safety issues and interventions. Fall protocol. Skin/wound  care. Signs/symptoms of infection. Incision care.       Audience: Patient.       Mode: Explanation.  Demonstration.       Response: Verbalized understanding.    Discharge: Patient is not being discharged at this time.    Long Term Goals: Within 2 weeks, patient will be able verbalize 2/10 on pain  scale on a consistent basis for effective pain management.  Within in 2 weeks,  patient will be able to state medication, indications, and  possible side effects.  Short Term Goals: Within 2 weeks, patient will be able to state medications  being taken along with indications, and possible side effects. - Goal Not Met  Within 2 weeks, patient will have effective pain management using medication as  prescribed, and verbalize 2/10 pain scale.    PROGRESS TOWARD GOALS: SHORT TERM GOAL REVIEW:       2. Within 2 weeks, patient will be able to state medications being taken  along with indications, and possible side effects. - Met  Time frame to achieve short term goal(s):  Within 2 weeks, patient will have  effective pain management using medication as prescribed, and verbalize 2/10  pain scale.    PLAN: Nursing Specific Interventions  Medication Management. Pain Management. Skin Management. Wound Management.  Continue with the current Nursing Plan of Care.    TEAM CARE PLAN  Identified problems from team documentation:  Problem: Impaired Mobility  Mobility: Primary Team Goal: Pt will be indep with all bed mobility and  transfers with LRAD, indep with walking at least 63ft iwth LRAD, mod-max A for 4  steps with LRAD and HR/Active    Problem: Impaired Pain Management  Pain Mgmt: Primary Team Goal: Team will collaborate and work efficiently and  effectively in achieving manageable pain levels while participating in acute  rehab./    Problem: Impaired Self-care Mgmt/ADL/IADL  Self Care: Primary  Team Goal: Patient will perform toileting transfer and task  with MOD I.  Caregiver will demo safe supervision for bathing and LB dressing  according to family training./    Add/Update Problems from this assessment:  No updates at this time.    Please review Integrated Patient View Care Plan Flowsheet for Team identified  Problems, Interventions, and Goals.    Signed by: Stephanie Acre, RN 10/05/2013 12:30:00 PM

## 2013-10-06 LAB — COMPREHENSIVE METABOLIC PANEL
ALT: 8 U/L (ref 0–55)
AST (SGOT): 15 U/L (ref 5–34)
Albumin/Globulin Ratio: 0.8 — ABNORMAL LOW (ref 0.9–2.2)
Albumin: 2.9 g/dL — ABNORMAL LOW (ref 3.5–5.0)
Alkaline Phosphatase: 82 U/L (ref 40–150)
BUN: 16 mg/dL (ref 7–21)
Bilirubin, Total: 0.7 mg/dL (ref 0.2–1.2)
CO2: 31 mEq/L — ABNORMAL HIGH (ref 22–29)
Calcium: 9.2 mg/dL (ref 7.9–10.6)
Chloride: 102 mEq/L (ref 98–107)
Creatinine: 0.8 mg/dL (ref 0.6–1.0)
Globulin: 3.6 g/dL (ref 2.0–3.6)
Glucose: 113 mg/dL — ABNORMAL HIGH (ref 70–100)
Potassium: 3.6 mEq/L (ref 3.5–5.1)
Protein, Total: 6.5 g/dL (ref 6.0–8.3)
Sodium: 142 mEq/L (ref 136–145)

## 2013-10-06 LAB — CBC AND DIFFERENTIAL
Basophils Absolute Automated: 0.02 10*3/uL (ref 0.00–0.20)
Basophils Automated: 0 %
Eosinophils Absolute Automated: 0.43 10*3/uL (ref 0.00–0.70)
Eosinophils Automated: 5 %
Hematocrit: 33.4 % — ABNORMAL LOW (ref 37.0–47.0)
Hgb: 10.4 g/dL — ABNORMAL LOW (ref 12.0–16.0)
Immature Granulocytes Absolute: 0.02 10*3/uL
Immature Granulocytes: 0 %
Lymphocytes Absolute Automated: 1.71 10*3/uL (ref 0.50–4.40)
Lymphocytes Automated: 19 %
MCH: 26.7 pg — ABNORMAL LOW (ref 28.0–32.0)
MCHC: 31.1 g/dL — ABNORMAL LOW (ref 32.0–36.0)
MCV: 85.9 fL (ref 80.0–100.0)
MPV: 9.8 fL (ref 9.4–12.3)
Monocytes Absolute Automated: 0.72 10*3/uL (ref 0.00–1.20)
Monocytes: 8 %
Neutrophils Absolute: 6.29 10*3/uL (ref 1.80–8.10)
Neutrophils: 69 %
Nucleated RBC: 0 (ref 0–1)
Platelets: 301 10*3/uL (ref 140–400)
RBC: 3.89 10*6/uL — ABNORMAL LOW (ref 4.20–5.40)
RDW: 15 % (ref 12–15)
WBC: 9.17 10*3/uL (ref 3.50–10.80)

## 2013-10-06 LAB — C-REACTIVE PROTEIN: C-Reactive Protein: 6.2 mg/dL — ABNORMAL HIGH (ref 0.0–0.5)

## 2013-10-06 LAB — GFR: EGFR: >60

## 2013-10-06 LAB — SEDIMENTATION RATE: Sed Rate: 53 — ABNORMAL HIGH (ref 0–20)

## 2013-10-06 MED ORDER — SULFAMETHOXAZOLE-TMP DS 800-160 MG PO TABS
1.0000 | ORAL_TABLET | Freq: Two times a day (BID) | ORAL | Status: DC
Start: 2013-10-06 — End: 2013-10-13
  Administered 2013-10-06 – 2013-10-12 (×13): 1 via ORAL
  Filled 2013-10-06 (×15): qty 1

## 2013-10-06 NOTE — Consults (Signed)
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    Wound assessment:  Incision has mix of sutures and staples present, in the middle of the incision there is some slough with serous sanguineous drainage. No odor, raised warmth or erythema noted. Distally there is a drain site that has copious serous drainage. Medial lower right graft site has an intact incision with staples, no drainage seen.    Plan:  [place]rahoney to right knee, over slough to provide a moist wound healing environment and will help with debridement of any necrotic tissue, for daily dressings  Foam dressing top right lower leg old drain site, change each shift or PRN per drainage   Monitor document changes    Loyal Jacobson RN [facility] ext [phone]

## 2013-10-06 NOTE — Rehab Progress Note ([facility]) (Addendum)
Corrected 10/07/2013 2:51:07 PM    NAME: [name]  MRN: [id number]  Account: [id number]  Session Start: 10/06/2013 8:00:00 AM  Session Stop: 10/06/2013 9:00:00 AM    Occupational Therapy  Inpatient Rehabilitation Progress Note    Rehab Diagnosis: Gait Disturbance  Demographics:            Age: 89Y            Gender: Female  Rehabilitation Precautions/Restrictions:   fall precautions, infection control, R knee EXT at all times, 10% WB RLE    SUBJECTIVE  Pain: Patient reported 5/10 R knee pain. RN provided pain med.    OBJECTIVE  Functional Measures      GROOMING: Grooming Score = 5. Patient is supervision/set-up for grooming,  requiring: Setting out grooming equipment.  Patient requires the following assistive device(s) No assistive devices were  required.    BATHING: Patient bathed in shower. Bathing Score = 5.  Patient is  supervision/set-up for bathing, requiring: Setting out bathing equipment.  Patient requires the following assistive device(s): Hand held shower.    UPPER BODY DRESSING: Upper Body Dressing Score = 5. Patient is  supervision/set-up for upper body dressing, requiring: Gathering/setting out  clothes.  Patient requires the following assistive device(s):  No assistive devices were  required.    LOWER BODY DRESSING: Patient requires no physical assistance for donning and/or  doffing undergarment threading right leg. Patient requires no physical  assistance for donning and/or doffing undergarment threading left leg. Patient  requires no physical assistance for donning and/or doffing undergarment over  hips and adjusting fasteners. Patient requires no physical assistance for  donning and/or doffing pants/skirt threading right leg. Patient requires no  physical assistance for donning and/or doffing pants/skirt threading left leg.  Patient requires no physical assistance for donning and/or doffing pants/skirt  over hips and adjusting fastener. Patient requires maximal assistance for  donning and/or  doffing right sock. Patient requires maximal assistance for  donning and/or doffing left sock. Donning and/or doffing right shoe was not  observed for this patient. Donning and/or doffing left shoe was not observed for  this patient. Patient performs 81.25 % of lower body dressing tasks. Lower Body  Dressing Score = 4, Minimal Assistance.  Patient requires the following assistive device(s): Reacher.    TRANSFERS BED/CHAIR/WHEELCHAIR: Bed/chair/wheelchair Transfer Score = 4.  Patient performs 75% or more of effort and minimal assistance (little/incidental  help/lifting of one limb/steadying) for transferring to and from the  bed/chair/wheelchair, requiring: Steadying.  Patient requires the following assistive device(s): Bed rails.  Elevated head of bed.  Elevated bed/surface.  Seating system of wheelchair.    TRANSFER SHOWER: Shower Transfer Score = 4. Patient performs 75% or more of  effort and minimal assistance (little/incidental help/lifting of one  limb/steadying) for transferring to and from the shower, requiring: Steadying.  Grab bars.  Shower chair.    Interventions:       Therapeutic Activities:  Treatment focused on completion of ADL routine.  Patient left in [facility] w/ call button in reach.    ASSESSMENT  Patient is gaining proficiency w/ compensatory strategies for ADL completion w/  RLE TTWB. Patient currently lacks the strength/endurance necessary to complete  ADL transfers w/ RW instead of WC.    Long Term Goals: Patient will perform toileting transfer and task with MOD I.  Patient will perform UB dressing with MOD I and LB dressing with PRN AE and SBA.  Patient will stand for at least 3 minutes in order  to assist with standing ADL  tasks.  Patient will perform bathing transfer and task with SBA.  2 weeks  Short Term Goals: Patient will perform toileting transfer with CGA and good  maintenance of RLE precautions.  Pt will perform LB dressing with AE PRN and MOD A.  1 week    Progress Towards Goals: SHORT  TERM GOAL REVIEW:       2. Pt will perform LB dressing with AE PRN and MOD A. - Met  Time frame to achieve short term goal(s):  1 week    PLAN  Continued Occupational Therapy is recommended.  Recommended Frequency/Duration/Intensity: 1-2 hours/day, 5-7days/week, for 14  days s/p eval on 10/05/13.  Continued Activities Contributing Toward Care Plan: Therapeutic exercise,  therapeutic activity, ADL/transfer training, balance training, endurance  training, pt/family education    Care Plan  Identified problems from team documentation:  Problem: Impaired Mobility  Mobility: Primary Team Goal: Pt will be indep with all bed mobility and  transfers with LRAD, indep with walking at least 47ft iwth LRAD, mod-max A for 4  steps with LRAD and HR/Active    Problem: Impaired Pain Management  Pain Mgmt: Primary Team Goal: Team will collaborate and work efficiently and  effectively in achieving manageable pain levels while participating in acute  rehab./    Problem: Impaired Self-care Mgmt/ADL/IADL  Self Care: Primary Team Goal: Patient will perform toileting transfer and task  with MOD I.  Caregiver will demo safe supervision for bathing and LB dressing  according to family training./    Add/Update Problems from this Treatment:  Update of existing problem:   Self Care Management: Patient requires MIN A w/ bathing/LB dressing/ADL  transfers and SBA w/ UB dressing/grooming.    Discipline:  OT    Please review Integrated Patient View Care Plan Flowsheet for Team identified  Problems, Interventions, and Goals.    3 Hour Rule Minutes: 60 minutes of OT treatment this session count towards  intensity and duration of therapy requirement. Patient was seen for the full  scheduled time of OT treatment this session.    Signed by: Alita Chyle, OTR/L 10/06/2013 9:00:00 AM

## 2013-10-06 NOTE — Rehab Progress Note ([facility]) (Signed)
[name]  MRN: [id number]  Account: [id number]  Session Start: 10/06/2013 12:00:00 AM  Session Stop: 10/06/2013 12:00:00 AM    SEVERE SEPSIS SCREEN  INFECTION:  Patient has documented infection. Is on antibiotic therapy (not  prophylaxis).    SYSTEMATIC IMFLAMMATORY RESPONSE SYNDROME (SIRS):  Patient  has no indication of  systematic inflammatory response syndrome (SIRS). Negative Sepsis Screen.  If you are unable to assess a system's dysfunction because you do not have labs,  or the labs you have are not current (within 24 hours), call physician and  request and order for the lab tests needed.    Signed by: Gordy Clement, RN3 10/06/2013 11:02:00 PM

## 2013-10-06 NOTE — Rehab Evaluation ([facility]) (Addendum)
Corrected 10/08/2013 9:22:23 AM    NAME: [name]  MRN: [id number]  Account: [id number]  Session Start: 10/06/2013 12:00:00 AM  Session Stop: 10/06/2013 12:00:00 AM    Therapeutic Recreation  Inpatient Rehabilitation Initial Evaluation    Risks/Benefits of Rehabilitation Discussed with Patient/Caregiver: Yes.    Rehab Diagnosis: Gait Disturbance  Demographics:            Age: 77Y            Gender: Female  Primary Language: english    Past Medical History: L. total knee replacement  History of Present Illness: This 77 y.o. year old female with history of R TKA  on 08/25/13 by Dr. Ahmed Prima was readmitted to [facility] on 09/05/13 and underwent  revisions on 09/05/13, 12/8 /14, 09/22/13 with muscle flap/ skin grafting by  plastic surgery on 09/22/13. The patient is to keep leg extended ; current WB  status is 10 %    Wound culture was positive for Proteus. Per ID ,continue Cipro through 10/27/13            Date of Onset: 09-05-13            Date of Admission: 10/03/2013 6:43:00 PM    Medications and Allergies: Significant rehabilitation considerations:   See Epic  Rehabilitation Precautions/Restrictions:   fall precautions, infection control, R knee EXT at all times, 10% WB RLE    SUBJECTIVE  Premorbid Functional Level: The patient reported the premorbid level of function  was Pt was indep with all mobility prior to admission, no AD  Understanding of Current Condition: Patient is aware that she has right knee  infection and has had multiple surgeries. She is aware that she is here for  acute rehab inpatient services.  Patient/Caregiver Goals:  Patient's functional goals: I want to get better so  that I can go home.  Pain: Patient currently without complaints of pain.  Social History:  Marital Status: Single  Children: daughter          Reside:  pt. lives with daughter  Employment Status:  retired  Recreational Activities/Hobbies:    OBJECTIVE  Physical Limitations: Impaired functional mobility, balance and endurance. 10%  WB  RLE    Cognition/Behavior: WFL for leisure activities    Leisure Interests:  time with family, TV, magazines, shopping, playing cards    Pain Reassessment: Pain was not reassessed as no pain was reported.    Interdisciplinary Educational Needs and Learning Preferences:       Learning Preference: The patient's preferred learning method is:  Explanation.       Barriers to Learning: Mobility.       Learning Needs: Leisure/vocational.    Education Provided: No education provided this session.    ASSESSMENT  Summary of Deficits and Related Problems: Patient presents with the following  deficits: Decreased Functional Mobility. Decreased Leisure Independence.  These deficits are secondary to: Impaired Strength. Impaired Balance.  Orthopedic/Weight Bearing Restrictions.  Rehab Potential: Good family/social support, Living in the community premorbidly    Barriers to Progress/Discharge: Functional status    Long Term Goals: Time frame to achieve long term goal(s): Discharge       1. Pt. will participate in a leisure activity of choice with mod I upon  discharge.       2. Pt. will improve strength and endurance as demonstrated through the  ability to perform activities upon discharge.  Short Term Goals: Not applicable.    Recommendations/Goals for Rehabilitation Discussed with Patient/Caregiver:  Yes.        PLAN  Therapeutic Recreation services are recommended to address: leisure maintenance,  education, socialization    Care Plan  Identified problems from team documentation:  Problem: Impaired Mobility  Mobility: Primary Team Goal: Pt will be indep with all bed mobility and  transfers with LRAD, indep with walking at least 53ft iwth LRAD, mod-max A for 4  steps with LRAD and HR/Active    Problem: Impaired Pain Management  Pain Mgmt: Primary Team Goal: Team will collaborate and work efficiently and  effectively in achieving manageable pain levels while participating in acute  rehab./    Problem: Impaired Self-care  Mgmt/ADL/IADL  Self Care: Primary Team Goal: Patient will perform toileting transfer and task  with MOD I.  Caregiver will demo safe supervision for bathing and LB dressing  according to family training./    Identified problems from this assessment:     No problems identified at this time.    Please review Integrated Patient View Care Plan Flowsheet for Team identified  Problems, Interventions, and Goals.    Signed by: Pearson Forster, CTRS 10/06/2013 3:50:00 PM

## 2013-10-06 NOTE — Progress Notes (Signed)
[name]  MRN: [id number]  Account: [id number]  Session Start: 10/06/2013 12:00:00 AM  Session Stop: 10/06/2013 12:00:00 AM    Physical Medicine and Rehabilitation  Initial Individualized Interdisciplinary Plan Of Care    Rehab Diagnosis: Gait Disturbance  Demographics:            Age: 40Y            Gender: Female    Plan Of Care  Anticipated Discharge Date/Estimated Length of Stay: 2 wks  Anticipated Discharge Destination: Community discharge with assistance  Discharge Plan : home  Medical Necessity Expected Level Rationale: 77 y.o. female with  dysfunction of  mobility/ ADL due to complicated R TKA on 08/25/13, revisions on 09/05/13, 12/8  /14, 09/22/13 with muscle flap/ skin grafting on 09/22/13  Intensity and Duration: an average of 3 hours/5 days per week  Medical Supervision and 24 Hour Rehab Nursing: x  Physical Therapy: x  PT Intensity/Duration: 60-120'daily for 5-6 d/wk  Occupational Therapy: x  OT Intensity/Duration: 60-120'daily for 5-6 d/wk    The following is a list of patient problems that have been identified by the  interdisciplinary team:    Problem: Impaired Mobility  Mobility Status Update: Pt requires min A for bed mobility, CGA for transfers  and to walk 24ft with RW  Team Identified Barrier to Discharge: Yes  Interventions:  Transfer training: Active  Gait training: Active  Mobility: Primary Team Goal/Status: Pt will be indep with all bed mobility and  transfers with LRAD, indep with walking at least 54ft iwth LRAD, mod-max A for 4  steps with LRAD and HR / Active    Problem: Impaired Pain Management  Pain Mgmt: Primary Team Goal/Status: Team will collaborate and work efficiently  and effectively in achieving manageable pain levels while participating in acute  rehab. /    Problem: Impaired Self-care Mgmt/ADL/IADL  Self Care/ADL/IADL Status Update: Patient requires MIN A w/ bathing/LB  dressing/ADL transfers and SBA w/ UB dressing/grooming.  Self Care: Primary Team Goal/Status: Patient  will perform toileting transfer and  task with MOD I.  Caregiver will demo safe supervision for bathing and LB  dressing according to family training. /    Comments: complicated R TKA on 08/25/13, revisions on 09/05/13, 12/8 /14,  09/22/13 with muscle flap/ skin grafting on 09/22/13 :incision monitoring, daily  dressing change, 10% WB to R LE; cipro through 10/27/12  Patient must keep R knee extended at all times- should not use knee immob due to  pressure  Will request plastic surgery and wound nurse consult  Poor wound healing: Juven ,Boost Breeze  COPD: spiriva, albuterol, IS      E Coli UTI: resistant to Cipro, will add Bactrim    Signed by: Hendricks Limes, DO 10/06/2013 4:17:00 PM    Physician CoSigned By: Hendricks Limes 10/06/2013 16:17:12

## 2013-10-06 NOTE — Rehab Progress Note ([facility]) (Signed)
NAMEKURT AZIMI  MRN: [id number]  Account: [id number]  Session Start: 10/06/2013 12:00:00 AM  Session Stop: 10/06/2013 12:00:00 AM    SEVERE SEPSIS SCREEN  INFECTION:  Patient has documented infection. Is on antibiotic therapy (not  prophylaxis).    SYSTEMATIC IMFLAMMATORY RESPONSE SYNDROME (SIRS):  Patient  has no indication of  systematic inflammatory response syndrome (SIRS). Negative Sepsis Screen.  If you are unable to assess a system's dysfunction because you do not have labs,  or the labs you have are not current (within 24 hours), call physician and  request and order for the lab tests needed.    Signed by: Doretha Imus, RN 10/06/2013 9:00:00 AM

## 2013-10-06 NOTE — Progress Notes (Signed)
IRF Physiatry Attending Face to Face Progress Note    Date Time: 10/06/2013 3:12 PM  Patient Name: [name], [name]    Admission date:  10/03/2013    Functional Status/Update:     [x] I I reviewed patient's therapy notes to assess functional status and ongoing need for therapies.   Of note:   UB dressing- S  LB dressing - min A  Transfers- min A    []  Patient discussed at length today at inter-disciplinary team rounds-conference ;report is pending in [facility]  Subjective and interval development:   Patient c/o r knee pain 5-6/10 and insomnia    Review of Systems:   A comprehensive review of systems was: No fevers, chills, nausea, vomiting, chest pain, shortness of breath, cough, headache, double vision    Objective:   Vitals:  Filed Vitals:    10/05/13 1031 10/05/13 2100 10/06/13 0558 10/06/13 1050   BP: 140/70 162/65 155/66 147/65   Pulse: 68 76 69 67   Temp:  97.9 F (36.6 C) 97.9 F (36.6 C)    Resp:  18 18 18    SpO2:  97% 96%      Physical Examination:  CV:+S1S2 Heart rate and rhythm are regular. 1+ edema in the BLEs.   Pulm:Lungs are clear to auscultation bilaterally. No wheezes  YIR:SWNI. Non-tender. Normoactive bowel sounds.   Skin: R knee incision with serosang drainage   Neuro : alert, smiling; oriented  .Sensation to LT is intact.  MMS;  MMT- grossly 5/5 except R KF/KE- NT due to immobilization  New Labs:   No results found for this basename: GLUCOSEWHOLE:10 in the last 24 hours    No results found for this basename: WBC:2,HGB:2,HCT:2,PLT:2 in the last 168 hours     No results found for this basename: NA:2,K:2,CL:2,CO2:2,BUN:2,CREAT:2,CA:2,ALB:2,PROT:2,BILITOTAL:2,ALKPHOS:2,ALT:2,AST:2,GLU:2,MG:2,PHOS:2 in the last 168 hours    No results found for this basename: INR:2,APTT:2 in the last 168 hours       Rads:       Current medications:     Scheduled Meds: PRN Meds:         amLODIPine 10 mg Oral Daily   bisoprolol 5 mg Oral Daily   celecoxib 200 mg Oral Daily   ciprofloxacin 750 mg Oral Q12H SCH   cloNIDine  0.1 mg Oral Daily   docusate sodium 100 mg Oral Q12H SCH   ferrous sulfate 324 mg-ascorbic acid 500 mg combo dose  Oral Daily   gabapentin 300 mg Oral Q12H SCH   hydrochlorothiazide 25 mg Oral Daily   polycarbophil 1,250 mg Oral Daily   polyethylene glycol 17 g Oral Daily   potassium chloride 10 mEq Oral Daily   Senna 17.2 mg Oral QHS   tiotropium 18 mcg Inhalation QAM   traMADol 50 mg Oral Q6H SCH       Continuous Infusions:         albuterol 2.5 mg Q4H PRN   bisacodyl 10 mg QD PRN   diphenhydrAMINE 25 mg Q6H PRN   HYDROcodone-acetaminophen 1 tablet Q4H PRN   HYDROcodone-acetaminophen 2 tablet Q6H PRN   hydrOXYzine 25 mg Q12H PRN   naloxone 0.1 mg PRN   ondansetron 4 mg Q8H PRN   oxyCODONE 10 mg Q12H PRN   senna-docusate 2 tablet BID PRN   zolpidem 5 mg QHS PRN         Assessment:   77 y.o. 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, revisions on 09/05/13, 12/8 /14, 09/22/13 with  muscle flap/ skin grafting on 09/22/13       Plan:  REHAB: Continue comprehensive and intensive inpatient rehab program, including PT/OT    Will continue to address the following impairments and issues:   Co-morbid conditions that require management/monitoring:   complicated R TKA on 08/25/13, revisions on 09/05/13, 12/8 /14, 09/22/13 with muscle flap/ skin grafting on 09/22/13 :incision monitoring, daily dressing change, 10% WB to R LE; cipro through 10/27/12   Patient must keep R knee extended at all times- should not use knee immob due to pressure  Will request plastic surgery and wound nurse consult  Poor wound healing: Juven ,Boost Breeze   COPD: spiriva, albuterol, IS     E Coli UTI: resistant to Cipro, will add Bactrim      Coordination of care:  Care coordinated with  Rehab nurse    Discharge planning: home    Signed by: Earl Many DO     [facility]

## 2013-10-06 NOTE — Rehab Liaison Note ([facility]il[facility]) (Signed)
[name]  MRN: [id number]  Account: [id number]  Session Start: 10/03/2013 12:00:00 AM  Session Stop: 10/03/2013 12:00:00 AM    Clinical Liaison  Inpatient Rehabilitation Pre Admission Screen    Medical Diagnosis:  Infection R Knee/Arthroplasty Revision with Flap  Rehab Diagnosis: Gait Instability r/t R Knee Arthroplasty  Probable Impairment Group Code: 08.9 Other Orthopedic  Demographics:   Age: 77Y   Gender: Female   Name, phone and relationship of contact person:  Contact Name: Radene Journey  Phone:   [phone]  Relationship:   Child   Guardian/Power of Attorney:   It is unknown whether the patient has a Advertising account planner.   [facility] Medical Record Number: [id number]  Past Medical History: Past Medical History    ?  Hypertension  ?  Pneumonia  ?  Chronic obstructive pulmonary disease  ?  Headache(784.0)      not since surgery in 1957  ?  Arthritis  ?  Difficulty in walking(719.7)  ?  Low back pain    Past Surgical History    ?  Cervical spine surgery  ?  Lumbar spine surgery  posterior w/ hardware  ?  Tonsillectomy  ?  Brain surgery  R side/ 1957 Optic nerve pressure  ?  Eye surgery      cataracts  ?  Colonoscopy  ?  Tubal ligation  1961  ?  Arthroplasty, knee, total  08/25/2013      Procedure: ARTHROPLASTY, KNEE, TOTAL;  Surgeon: Lane Hacker, MD;  Location:  [facility];  Service: Orthopedics;  Laterality: Right;  ?  Arthroplasty, knee, revision total  09/05/2013      Procedure: ARTHROPLASTY, KNEE, REVISION TOTAL;  Surgeon: Lane Hacker, MD;  Location: [facility];  Service: Orthopedics;  Laterality: Right;  RT KNEE  WASHOUT and POLY EXCHANGE **POA/CH** (STRYKER)  ?  Arthroplasty, knee, resection  09/15/2013      Procedure: ARTHROPLASTY, KNEE, RESECTION;  Surgeon: Lane Hacker, MD;  Location: [facility];  Service: Orthopedics;  Laterality: Right;  AND  PLACEMENT OF ANTIBIOTIC SPACER  Placement of wound vac  ?  Arthroplasty, knee, revision total  09/22/2013       Procedure: ARTHROPLASTY, KNEE, REVISION TOTAL;  Surgeon: Lane Hacker, MD;  Location: [facility];  Service: Orthopedics;  Laterality: Right;  ?  Debridement, muscle flap closure  09/22/2013      Procedure: DEBRIDEMENT, MUSCLE FLAP CLOSURE;  Surgeon: Ernie Avena, MD;  Location: [facility];  Service: Plastics;  Laterality: Right;  DEBRIDEMENT and MUSCLE FLAP KNEE RT  ?  Graft, skin  split thickness, (medical)  09/22/2013      Procedure: GRAFT, SKIN  SPLIT THICKNESS, (MEDICAL);  Surgeon: Ernie Avena, MD;  Location: [facility];  Service: Plastics;  Laterality: N/A;    Prior Level of Functioning:  Mobility:   Mod I with SPC or Walker  Activities of Daily Living:  Independent  Cognition:  WFL  Communication:  WFL  Swallowing:  WFL    Social History:  Marital Status: Widowed  Children: Daughter          Reside:  Pt is now staying with daughter in her home  Employment Status:  Retired  Recreational Activities/Hobbies:   Pt will discharge to her daughter's home.  Daughter is retired and works  sometimes for her husband.  Pre-hospital Living Environment: Lives independently with daughter and her  family until well enough to  go home to [place].  Daughter's home as 8  stairs from outside to basement level where pt will be staying.  Pt has  everything she needs in the basement suite.  Language:  English  Hand Dominance: Right.    The following information was gathered for consideration and maintenance in the  medical record to substantiate medical necessity for an IRF level of care.    This assessment is being completed by Grayling Congress , CRRN . Patient is  currently at [id number].    The patient is being referred and recommended by their physician, Dr. Ahmed Prima, to  be assessed both medically and functionally in regard to their premorbid  functional capacity to determine whether they can benefit from a rehabilitation  level of care offered by our facility.    The following is information  regarding the medical complexity and clinical risk  factors that needs to be considered for the appropriate management of the  patient's care and recovery.  Acute Medical Conditions: Pain, extensive wound care with R knee flap; s/p R  knee arthroplasty revision with flap; impaired mobility and ADL status  History of Present Illness:  Pt admitted 09/05/13 for revision of Right Knee  Arthroplasty.  Antibiotic spacer was initiated and plastics did a R Knee flap.  Pt has to keep leg in extension and is 10% weight bearing on the right.  Pt has  anemia and has blood transfusion.  Infectious Disease Dr. following for R knee  infection.    Pt will stay at her daughter's home on discharge.  Daughter is retired.   Date of Onset: 09/05/13   Date Admitted to Acute:  09/05/13  Current Precautions:   Hypertension precautions.   Risk for falls.   Skin fragility.   Right lower extremity: 10% weight bearing on the Right.  Right leg must be in  full extension.  Food Allergies  No known food allergies.  Medication Allergies   No known medication allergies.  Other Allergies Not applicable.    Present Systems Summary:  Maximum Temperature (last 48 hrs):  98.1 degrees.  Pulse:  62 beats per minute.  Respirations:  16 breaths per minute.  Blood Pressure:   148/67 mmHg.  Pulse Ox: 95% on room air  Height:   5'3"  Weight:   No weight  Hearing: No current issues  Vision: Patient has vision issues. Wears glasses.  Other Medical Comments:  DIET: Type: Regular.  Current Diet Texture: Regular diet.  Current Liquid Consistency: Thin liquids.  Bladder: Patient is continent of bladder.  Bowel: Patient does not have an ostomy.  Date of Last Bowel Movement:  Patient is continent of bowel.  Integumentary:   Surgical Site. Surgical wound with flap R Knee  Cardiopulmonary:   Room Air.  Dialysis: Patient currently is not receiving dialysis.  Current Medication(s): 10/03/13     Albuterol 2.5 mg Neb every 4 hours as needed  Norvasc 10 mg PO Daily  Zebeta 5  mg PO Daily  Celebrex 200 mg PO Daily  Cipro 750 mg PO Q12h  Catapres 0.1 mg PO Daily  Colace 100 mg PO Q12h  Iron/Vit C 324 mg/500 mg PO Daily  Neurontin 300 mg PO 2 x  2 x daily  Hydrodiuril 25 mg PO Daily  Oxycontin 10 mg PO q12h  Fibercon 1250 mg PO daily  Miralax 17 g PO Daily  K-Dur 10 mEq PO Daily  Senna 17.2 mg PO at bedtime  Spiriva 18 mcg  IH Every morning  Tramadol 50 mg PO every 6 hours  Current Alcohol Use:  Patient does not consume alcohol  Current Tobacco Use:  No, patient does not use tobacco  Current Drug Use: No, patient does not use recreational drugs.  Pain: Patient currently has pain.  Location: 2/10 R knee  Type: Acute    Additional medical documentation contributing to the expected care of this  patient may be noted in the following areas:  Laboratory-Chemistry/Hematology:   09/29/13 CBC  WBC 7.41, hgb 9.6, Hct 29.7    09/29/13 Chemistry  Na 141, K+ 3.2, Chloride 102, C02 29, BUN 9, Creatinine 0.6, Gluc 90    09/29/13 Albumin 2.2  Cultures:   ID doctor following for R Knee arthroplasty infection  Radiology:   X-Ray: 09/15/13 R Knee XRay  IMPRESSION:    Patient status post removal of arthroplasty hardware with fusion of the  knee joint.   Chest X-Ray: 09/18/13 Chest XRay  IVs:  IV Access: Saline Lock  Date of Insertion:    Is patient presently participating in rehabilitation? Yes  Adjustment to Present Illness: Patient is coping adequately.   Patient is accepting limitations adequately.   Patient's expectations are realistic.   Patient is motivated. To be as independent as possible  Activity Tolerance:  Fair.  SPECIAL NEEDS: WOUND CARE - PT HAS RIGHT KNEE FLAP    Current Functional Status  Weight-bearing Status:   Partial Weight Bearing      Location:  Right Knee  Weight Bearing Status: RLE partial weight bearing  Weight Bearing Percent: 10  Total Knee Replacement:  (knee in extension at all times)  Mobility: 10/03/13  Functional Mobility  Supine to Sit: Stand by assistance (HOB  elevated)  Scooting to EOB: Stand by assistance (requires max effort)  Sit to Stand:  (contact guard assist)  Stand to Sit:  (contact guard assist)    Transfers  Bed to Chair: Stand by assistance (requires max effort)  Device Used for Functional Transfer: front-wheeled walker    Locomotion  Ambulation: with front-wheeled walker (contact guard assist)  Ambulation Distance (Feet): 5 Feet (limited by decreased endurance)  Pattern: R decreased stance time;Step to;decreased step length;decreased cadence  Wheelchair Mobility:  50 ft x 2stand by assistance (verbal cues, mobility  limited by decreased endurance)  Activities of Daily Living: 10/03/13  Self Care:  Feeding: NT  Grooming: CGA standing at sink  Bathing: Mod A (without equipment)  UB Dressing: independent; seated in wheelchair  LB Dressing: CGA (required for standing balance. Pt able to don/doff socks and  thread pants on/off using reacher and sock aid with supervision seated in chair)  Toileting: CGA required for standing balance to complete toilet hygiene and to  don/doff pants around waist  Cognition: 10/03/13 Alert/Oriented. Follows commands. No communication deficits  noted.    Communication: 10/03/13 None noted at this time.    Swallowing:  10/03/13   No swallowing deficits present.    Balance: Balance status was not reported.    Other Impairments: Gait abnormality    Patient is able to understand and make healthcare decisions  Yes  Payer Source: Level of care will be discussed with the following payer sources  if/when applicable.  Primary: Medicare Managed by [employer]  Secondary: Not Applicable    Information and Case Discussion:   Rehabilitation risks/benefits were reviewed.  Patient/family/caregiver agrees to/accepts rehabilitation risks/benefits.   Rehab literature/brochure was provided to: Discussion with pt and her daughter  10/03/13.  In  agreement with plan for AR .  Case will be discussed with  Physician/Medical Director.    IRF Admission  Approval/Non-Approval  Appropriateness for admission to the Inpatient Rehabilitation Facility:  The  patient's condition is sufficiently stable to allow active participation in an  intensive interdisciplinary inpatient rehabilitation program. The patient would  benefit from interdisciplinary inpatient rehabilitation provided by a physician,  rehab-focused nursing, and a minimum of two rehab therapies which will provide  specialized care for the following functional deficits:   Cardiac Function  Endocrine  Hydration  Leisure Skills  Metabolic Function  Mobility  Nutrition  Pain Management  Psychosocial  Risk of Infection  Safety Risk  Self Care Management  Skin Wound Management  Comorbid conditions present at pre-admission:  The interdisciplinary team will also manage the potential risks and  complications from the following comorbid conditions:    s/p R Knee Arthroplasty Revision with antibiotic spacer and flap.  Pain; WB  Restrictions, Impaired mobility and ADL status  Recommended services:  The recommended interdisciplinary team will be comprised of the following  services:   Medical Supervision.  24 Hour Rehabilitation Nursing.  Physical Therapy.  Occupational Therapy.  Therapeutic Recreation.  Registered Dietitian.   Plastic surgery oversight  Patient's expected intensity and frequency of participation in the  interdisciplinary rehabilitation program: is 3 hours of therapy 5 days/week.  Prognosis and level of expected improvement with inpatient rehabilitation stay  is:   Mod I for ambulation with LRAD  Mod I for transfers  SBA for ADLs    Pt will require transport home  Estimated date of admission to acute inpatient:   10/03/13  Estimated length of inpatient rehabilitation stay in order to achieve rehab  medical/functional goals:   10+ days  Anticipated destination post discharge from inpatient rehabilitation is:   community discharge with assistance.    Anticipate patient will need the following services post  discharge from  inpatient rehabilitation: Home health therapy. Home health nursing.      Physician Approval Status of Admission: Admission Approval:  The patient's  condition is sufficiently stable to allow active participation in an intensive  interdisciplinary inpatient rehabilitation program. 77 yo F with complicated  multiple R knee surgeries including R TKA on 09/04/13, revision on 09/05/13 ,  12/8 and 09/22/13 with removal of hardware and muscle flap / skin grafting on  09/22/13, anemia, s/p tranfusion requires comprehensive rehabilitation and  medical management GNK 10/03/13 1704    This assessment denotes that on admission to the inpatient rehabilitation  facility, our physician will provide documentation that demonstrates clinical  rehabilitation complications for which the patient is at risk and a specific  plan to avoid those risks. Further, the medical conditions present create  possible adverse conditions that predictably can be controlled through an  intensive rehabilitation plan of care to be outlined at admission.    Annotated by: Grayling Congress  Annotated: 10/04/2013 9:07:00 AM  Pre authorization number [id number] from [id number] for Acute Rehab X 7 day starting  10/03/13  Authorized by Juliette Alcide CM at [phone]    Signed by: Grayling Congress, CRRN 10/03/2013 4:30:00 PM    Physician CoSigned By: Hendricks Limes 10/03/2013 17:05:33

## 2013-10-06 NOTE — Rehab Progress Note ([facility]il[facility]) (Signed)
[name]  MRN: [id number]  Account: [id number]  Session Start: 10/06/2013 11:00:00 AM  Session Stop: 10/06/2013 12:00:00 PM    Physical Therapy  Inpatient Rehabilitation Progress Note - Brief    Rehab Diagnosis: Gait Disturbance  Demographics:            Age: 24Y            Gender: Female  Rehabilitation Precautions/Restrictions:   fall precautions, infection control, R knee EXT at all times, 10% WB RLE    SUBJECTIVE  Pain: Pt reports 3/10 pain throughout session which is controlled by medication.      OBJECTIVE  Interventions:       Therapeutic Activities:  Pt practices supine to/from sit transfers with Min  A for management of RLE.  She completes supine ther ex's 2 x 10 bil, and  practices gait training 12' and 15' with CGA using RW.  PT performs relaxation  for pain management using gentle rocking and shaking.  Following session she is  left sitting up in w/c at bedside with call bell in reach and needs met, hand  off to nursing.    ASSESSMENT  Pt fatigues easily with gait training and requires w/c follow for safety.  Pain  and weakness in RLE, along with WB restrictions, limit ability to complete all  functional tasks at this time.    PLAN  Continued Physical Therapy is recommended.  Recommended Frequency/Duration/Intensity: 1-2 hrs/day, 5-6 days/wk for 2 weeks  from evaluation on 10/04/13  Activities Contributing Toward Care Plan: Bed mobility training, transfer  training, balance training, gait training, stair training, therapeutic  strengthening exercises, therapeutic activities, ROM exercises, endurance  activities, family/caregiver training, instruction in home exercise program,  education for home modifications, procurement of DME.    3 Hour Rule Minutes: 60 minutes of PT treatment this session count towards  intensity and duration of therapy requirement. Patient was seen for the full  scheduled time of PT treatment this session.    Signed by: Jaquita Rector, PT 10/06/2013 12:00:00 PM

## 2013-10-06 NOTE — Rehab Progress Note ([facility]) (Signed)
[name]  MRN: [id number]  Account: [id number]  Session Start: 10/06/2013 12:00:00 AM  Session Stop: 10/06/2013 12:00:00 AM    Rehabilitation Nursing  Inpatient Rehabilitation Shift Assessment    Rehab Diagnosis: Gait Disturbance  Demographics:            Age: 77Y            Gender: Female  Primary Language: english    Date of Onset:  09-05-13  Date of Admission: 10/03/2013 6:43:00 PM    Rehabilitation Precautions Restrictions:   fall precautions, infection control, R knee EXT at all times, 10% WB RLE    Patient Report: " I have had good care since I've been here"  Pain: Patient currently has pain.  Location: Right knee  Type: Acute  Quality: Aching.  Pain Scale: Numeric.  Patient reports a pain level of 5 out of 10.  Patient's acceptable level of pain 2 out of 10.   Interferes with physical activity.  Pain is alleviated by: Oxycontin  Pain is exacerbated by: Movement Patient medicated. oxycontin  Pain Reassessment: Response to Pain Intervention: Much better  Post Intervention Pain Quality:  Aching.  Patient Reports Post Intervention Pain Level of: 2 out of 10  Pain Acceptable: Yes  Patient/Caregiver Goals:  I want to get better so that I can go home.    NEURO  Orientation/Awareness: Alert and Oriented x4.  Speech: Clear.  Behavior: Cooperative.    MEDICATIONS  IV Access: No IV access.  Dialysis Access: Patient does not have dialysis access.      Elopement Risk Level Assessment Tool  Patient Criteria: Patient is not capable of leaving the unit.  Assessment is not  applicable.      RISK ASSESSMENT FOR FALLS/INJURY    MENTAL STATUS CRITERIA:   0 - None identified.  MENTAL STATUS TOTAL: 0    AGE CRITERIA:   77 - 5-104 years old  AGE TOTAL: 1    ELIMINATION CRITERIA:   3 - Toileting with Assistance.   ELIMINATION TOTAL: 3    HISTORY OF FALLS CRITERIA:   2 - Unknown History.  HISTORY OF FALLS TOTAL: 2    MEDICATIONS CRITERIA:   2 or more High Risk Medications (*see list below)   2 or more medications (*see list  below)   MEDICATIONS TOTAL: 2    PHYSICAL MOBILITY CRITERIA:   3 - Decreased balance reaction.   PHYSICAL MOBILITY TOTAL: 3    FALLS RISK ASSESSMENT TOTAL: 11    Patient's Fall Risk: TOTAL SCORE >10: High Risk    Falls Interventions: Clutter removed and clear path to BR.  Call bell, phone, glasses, etc within reach.  Hourly toileting/safety checks between 6am and 10pm, then every 2 hours.  Yellow "high risk" patient identification in place: wrist band, socks, chart  sticker, door sign.  Reoriented PRN.  Family at bedside.    NUTRITION  Diet:  Type: Regular.  Food Consistency: Regular.  Liquid Consistency: Thin.      CARDIOVASCULAR     Right Lower Extremity  Nail Bed Color: Pink.   Trace edema in right ankle and around Right knee surgical incision site,  Elevated on a pillow while in bed and wheelchair.   Left Lower Extremity  Nail Bed Color: Pink.   1+ edema Left foot.  Pulses:   Apical Pulse: Regular. Strong. Rate is 67 .   Patient does not have a pacemaker.   Patient does not have a defibrillator.  CARDIOPULMONARY  Lung Sounds:   Upper lobes. Clear.   Lower lobes. Clear.  Type of Respirations: Regular.  Cough: No cough noted.  Respiratory Support: The patient does not require any respiratory support.  Respiratory Equipment: None. 96% R/A    INTEGUMENTARY  Skin:  Temperature: Warm  Turgor: Normal for age  Moisture: Dry  Color of skin: Normal for Race/Ethnicity  Capillary Refill: Less than 3 seconds  Wounds/Incisions:     Surgical Incision: Right knee anterior side incision incision Length: 21  centimeter(s) with staples and sutures. No signs of infection.  Drainage: Minimal amount of serous drainage.  Odor:  No  Incision Care: Per protocol.     Surgical Incision: Right knee posterior incision incision Length: 18  centimeter(s) with staples. No signs of infection.  Drainage: Incision without drainage.  Odor:  No  Incision Care: Per protocol.  Braden Scale for Predicting Pressure Sore Risk: Sensory Perception:  No  impairment  Moisture: Rarely moist  Activity: Chairfast  Mobility: Slightly limited  Nutrition: Adequate  Friction and Shear: Potential problem  Braden Score: 18  Level of Risk: At risk (15-18)   Wound care consult    GASTROINTESTINAL  Abdomen: Soft. Nontender.  Bowel Sounds:  Active bowel sounds audible in all four quadrants.  Date of Last Bowel Movement:  10/05/13   No Problems/Complaints with Bowel Elimination Assessed.    GENITOURINARY  Current Bladder Pattern: Continent  Color:   Patient denies problems with urination and/or catheter.    MUSCULOSKELETAL  Upper Body:  Lower Body: RLE- RIGHT LOWER EXTREMITY 10% WB in extension. NO bending knee    Functional Measures    EATING: Eating Score = 7. Patient is completely independent for eating.  There  are no activity limitations.    BLADDER MANAGEMENT - LEVEL OF ASSIST: Bladder Score = 7. Patient is completely  independent for bladder management. There are no activity limitations.    BLADDER ACCIDENTS THIS SHIFT:  0 . Patient has not had an accident this  assessment and did not require medications or devices.    BOWEL MANAGEMENT - LEVEL OF ASSIST: Bowel Score = 6.  Patient is modified  independent for bowel management.  Patient did not have bowel movement.  Medication/intervention was provided. Colace    BOWEL ACCIDENTS THIS SHIFT: 0 . Patient has not had an accident, but used a  stool softener.    TOILETING: Patient requires maximal assistance for adjusting clothing before  using a toilet, commode, bedpan, or urinal. Patient requires maximal assistance  for hygiene. Patient requires moderate assistance for adjusting clothing after  using a toilet, commode, bedpan, or urinal. Patient performs 0 -  24% of  toileting tasks.  Toileting Score = 1, Total Assistance.  Patient requires the following assistive device(s):  Grab bar.    TRANSFERS BED/CHAIR/WHEELCHAIR: Bed/chair/wheelchair Transfer Score = 3.  Patient performs 50-74% of effort and requires moderate  assistance (some  lifting)  for transferring to and from the bed/chair/wheelchair, including  assist lifting both legs.  Patient requires the following assistive device(s): Walker.    TRANSFER TOILET: Toilet Transfer Score = 3.  Patient performs 50-74% of effort  and requires moderate assistance (some lifting) for transferring to and from the  toilet/commode.  Patient requires the following assistive device(s):  Walker.    COMPREHENSION: Auditory comprehension is the usual mode. Comprehension Score =  7, Independent.  Patient comprehends complex/abstract information in their  primary language.  Patient is completely independent for auditory comprehension.  There are no activity limitations.    EXPRESSION: Vocal expression is the usual mode. Expression Score = 7,  Independent.  Patient expresses complex/abstract information in their primary  language.  Patient is completely independent for vocal expression.  There are no  activity limitations.    Education Provided:    Education Provided: Medication. Name and dosage. Administration. Purpose. Side  Effects. Skin/wound care. Signs/symptoms of infection. Incentive spirometry.       Audience: Patient.       Mode: Explanation.       Response: Verbalized understanding.    Discharge: Patient is not being discharged at this time.    Long Term Goals: Within 2 weeks, patient will be able verbalize 2/10 on pain  scale on a consistent basis for effective pain management.  Within in 2 weeks, patient will be able to state medication, indications, and  possible side effects.  Short Term Goals: Within 2 weeks, patient will be able to state medications  being taken along with indications, and possible side effects. - Goal Met  Within 2 weeks, patient will have effective pain management using medication as  prescribed, and verbalize 2/10 pain scale.    PROGRESS TOWARD GOALS: Right knee painful with movement, pain scale 5/10. Pain  went down to 2/10 after pain medication. Prn and  scheduled meds controling pain  at this time.    PLAN: Nursing Specific Interventions  Medication Management. Pain Management. Skin Management. Wound Management.  Continue with the current Nursing Plan of Care.    TEAM CARE PLAN  Identified problems from team documentation:  Problem: Impaired Mobility  Mobility: Primary Team Goal: Pt will be indep with all bed mobility and  transfers with LRAD, indep with walking at least 89ft iwth LRAD, mod-max A for 4  steps with LRAD and HR/Active    Problem: Impaired Pain Management  Pain Mgmt: Primary Team Goal: Team will collaborate and work efficiently and  effectively in achieving manageable pain levels while participating in acute  rehab./    Problem: Impaired Self-care Mgmt/ADL/IADL  Self Care: Primary Team Goal: Patient will perform toileting transfer and task  with MOD I.  Caregiver will demo safe supervision for bathing and LB dressing  according to family training./    Add/Update Problems from this assessment:  No updates at this time.    Please review Integrated Patient View Care Plan Flowsheet for Team identified  Problems, Interventions, and Goals.    Signed by: Doretha Imus, RN 10/06/2013 12:30:00 PM

## 2013-10-07 MED ORDER — TRAZODONE HCL 50 MG PO TABS
100.0000 mg | ORAL_TABLET | Freq: Every evening | ORAL | Status: DC | PRN
Start: 2013-10-07 — End: 2013-10-14
  Administered 2013-10-07: 100 mg via ORAL
  Filled 2013-10-07: qty 2

## 2013-10-07 MED ORDER — LIDOCAINE 5 % EX PTCH
2.0000 | MEDICATED_PATCH | CUTANEOUS | Status: DC
Start: 2013-10-07 — End: 2013-10-14
  Administered 2013-10-07 – 2013-10-14 (×8): 2 via TRANSDERMAL
  Filled 2013-10-07 (×8): qty 2

## 2013-10-07 MED ORDER — POLYETHYLENE GLYCOL 3350 17 G PO PACK
17.0000 g | PACK | Freq: Every day | ORAL | Status: DC | PRN
Start: 2013-10-07 — End: 2013-10-14
  Administered 2013-10-11 – 2013-10-14 (×2): 17 g via ORAL
  Filled 2013-10-07 (×2): qty 1

## 2013-10-07 NOTE — Rehab Progress Note ([facility]) (Signed)
[name]  MRN: [id number]  Account: [id number]  Session Start: 10/07/2013 11:00:00 AM  Session Stop: 10/07/2013 12:00:00 PM    Therapeutic Recreation  Inpatient Rehabilitation Progress Note    Rehab Diagnosis: Gait Disturbance  Demographics:            Age: 45Y            Gender: Female  Rehabilitation Precautions/Restrictions:   Fall, skin, 10% WB RLE    SUBJECTIVE  Patient Reports: "I'll try painting."  Patient/Caregiver Goals:  I want to get better so that I can go home.  Pain: Patient currently without complaints of pain.    OBJECTIVE    Interventions: The following treatment was provided:  Leisure Education:  Pt. participated in Insurance underwriter activity.  Pain Reassessment: Pain was not reassessed as no pain was reported.    Education Provided:  No education provided this session.    ASSESSMENT  Pt. seen in TR with focus on new activity learning. Pt. participated in ceramic  painting task, which was an unfamilair activity for pt. Able to manipulate  activity materials at table top with BUEs with mod I. Good participation,  enjoying social conversation for support and encouragement.  Progress Towards Goals:    PLAN  Therapeutic Recreation services are recommended to address: leisure maintenance,  education, endurance, social opportunities    Care Plan  Identified problems from team documentation:  Problem: Impaired Mobility  Mobility: Primary Team Goal: Pt will be indep with all bed mobility and  transfers with LRAD, indep with walking at least 63ft iwth LRAD, mod-max A for 4  steps with LRAD and HR/Active    Problem: Impaired Pain Management  Pain Mgmt: Primary Team Goal: Team will collaborate and work efficiently and  effectively in achieving manageable pain levels while participating in acute  rehab./    Problem: Impaired Self-care Mgmt/ADL/IADL  Self Care: Primary Team Goal: Patient will perform toileting transfer and task  with MOD I.  Caregiver will demo safe supervision for bathing and LB  dressing  according to family training./Active    Add/Update Problems from this Treatment:  No updates at this time.    Please review Integrated Patient View Care Plan Flowsheet for Team identified  Problems, Interventions, and Goals.    Signed by: Pearson Forster, CTRS 10/07/2013 12:00:00 PM

## 2013-10-07 NOTE — Rehab Progress Note ([facility]) (Signed)
[name]  MRN: [id number]  Account: [id number]  Session Start: 10/07/2013 12:00:00 AM  Session Stop: 10/07/2013 12:00:00 AM    SEVERE SEPSIS SCREEN  INFECTION:  Patient has documented infection. Is on antibiotic therapy (not  prophylaxis).    SYSTEMATIC IMFLAMMATORY RESPONSE SYNDROME (SIRS):  Patient  has no indication of  systematic inflammatory response syndrome (SIRS). Negative Sepsis Screen.  If you are unable to assess a system's dysfunction because you do not have labs,  or the labs you have are not current (within 24 hours), call physician and  request and order for the lab tests needed.    Signed by: Doretha Imus, RN 10/07/2013 9:30:00 AM

## 2013-10-07 NOTE — Rehab Progress Note ([facility]) (Signed)
NAMECINNAMON [name]  MRN: [id number]  Account: [id number]  Session Start: 10/07/2013 12:00:00 AM  Session Stop: 10/07/2013 12:00:00 AM    Rehabilitation Nursing  Inpatient Rehabilitation Shift Assessment    Rehab Diagnosis: Gait Disturbance  Demographics:            Age: 77Y            Gender: Female  Primary Language: english    Date of Onset:  09-05-13  Date of Admission: 10/03/2013 6:43:00 PM    Rehabilitation Precautions Restrictions:   Fall, skin, 10% WB RLE    Patient Report: none  Pain: Patient currently without complaints of pain.  Pain Reassessment: Pain was not reassessed as no pain was reported.  Patient/Caregiver Goals:  I want to get better so that I can go home.    NEURO  Orientation/Awareness: Alert and Oriented x3.  Speech: No deficits noted at this time.  Expresses Needs Functionally.  Behavior: Cooperative.    MEDICATIONS  IV Access: No IV access.  Dialysis Access: Patient does not have dialysis access.      Elopement Risk Level Assessment Tool  Patient Criteria: Patient is not capable of leaving the unit.  Assessment is not  applicable.      RISK ASSESSMENT FOR FALLS/INJURY    MENTAL STATUS CRITERIA:   0 - None identified.  MENTAL STATUS TOTAL: 0    AGE CRITERIA:   18 - 82-36 years old  AGE TOTAL: 1    ELIMINATION CRITERIA:   3 - Toileting with Assistance.   ELIMINATION TOTAL: 3    HISTORY OF FALLS CRITERIA:   2 - Unknown History.  HISTORY OF FALLS TOTAL: 2    MEDICATIONS CRITERIA:   2 or more High Risk Medications (*see list below)   MEDICATIONS TOTAL: 2    PHYSICAL MOBILITY CRITERIA:   3 - Decreased balance reaction.   1 - Weakness/impaired physical mobility.   PHYSICAL MOBILITY TOTAL: 4    FALLS RISK ASSESSMENT TOTAL: 12    Patient's Fall Risk: TOTAL SCORE >10: High Risk    Falls Interventions: Clutter removed and clear path to BR.  Call bell, phone, glasses, etc within reach.  Hourly toileting/safety checks between 6am and 10pm, then every 2 hours.  Initiate Fall care plan and outcome.  Yellow  "high risk" patient identification in place: wrist band, socks, chart  sticker, door sign.  Pt and family education.  Assistive devices at [facility].  Pharmacy review of meds.    NUTRITION  Diet:  Type: Regular.  Food Consistency: Regular.  Liquid Consistency: Thin.      CARDIOVASCULAR     Bilateral lower extremities  Nail Bed Color: Pink.   slight, decreasing swelling on the R LE  Pulses:   Apical Pulse: Regular. Strong. Rate is .   Patient does not have a pacemaker.   Patient does not have a defibrillator.    CARDIOPULMONARY  Lung Sounds:   Upper lobes. Clear.  Type of Respirations: Regular.  Cough: No cough noted.  Respiratory Support: The patient does not require any respiratory support.  Respiratory Equipment: None.    INTEGUMENTARY  Skin:  Temperature: Warm  Turgor: Normal for age  Moisture: Dry  Color of skin: Normal for Race/Ethnicity  Capillary Refill: Less than 3 seconds  Wounds/Incisions:     Surgical Incision: Posterior Right knee- OTA incision Length: 18 centimeter(s)  with staples. No signs of infection.  Drainage: Incision without drainage.  Odor:  No  Incision Care: Incision  healing     Surgical Incision: Right knee incision. Length: 20 centimeter(s) with staples  and sutures. Warmth present.  Drainage: Scant amount of serous drainage.  Odor:  No  Incision Care: Dressing changed, site cleansed with NS  Hematoma noted along incision line= 1x1 cm  Black eschar noted along insison line  Braden Scale for Predicting Pressure Sore Risk: Sensory Perception: Slightly  limited  Moisture: Occasionally moist  Activity: Chairfast  Mobility: Slightly limited  Nutrition: Adequate  Friction and Shear: Potential problem  Braden Score: 16  Level of Risk: At risk (15-18)   Pressure Relief every 30 minutes while in wheelchair   Turn patient every 2 hours   Assist patient to the bathroom every 2 hours    GASTROINTESTINAL  Abdomen: Soft. Nontender.  Bowel Sounds:  Active bowel sounds audible in all four quadrants.  Date of Last  Bowel Movement:  10/06/2013   No Problems/Complaints with Bowel Elimination Assessed. c/o nausea    GENITOURINARY  Current Bladder Pattern: Continent  Color:  Yellow   Patient denies problems with urination and/or catheter.    MUSCULOSKELETAL  Upper Body:  Lower Body: RLE- RIGHT LOWER EXTREMITY 10% WB in extension. NO bending of R knee    Functional Measures      TOILETING: Toileting Score = 4.  Patient requires minimal assistance for  toileting, such as steadying for balance while cleansing or adjusting clothes.  Patient requires the following assistive device(s):  Adaptive device to maintain  balance.  Grab bar.    BLADDER MANAGEMENT - LEVEL OF ASSIST: Bladder Score = 6. Patient is modified  independent for bladder management requiring: Adult brief    BLADDER ACCIDENTS THIS SHIFT:  0 . Patient has not had an accident but used an  adult brief this assessment.    BOWEL MANAGEMENT - LEVEL OF ASSIST: Bowel Score = 7. Patient is completely  independent for bowel management. There are no activity limitations. Pt was not  given any stool med aids because of nausea and GI upset    BOWEL ACCIDENTS THIS SHIFT: 0 . Patient has not had an accident and did not  require medications or devices. Pt had BM in the toilet    TRANSFERS BED/CHAIR/WHEELCHAIR: Bed/chair/wheelchair Transfer Score = 4.  Patient performs 75% or more of effort and minimal assistance (little/incidental  help/lifting of one limb/steadying) for transferring to and from the  bed/chair/wheelchair, requiring: Steadying. Contact guard. Hand placement.  Lifting one limb only. Help to/from supine only.  Patient requires the following assistive device(s): Walker.  Bed rails.  Elevated head of bed.  Elevated bed/surface.  Grab bars.    TRANSFER TOILET: Toilet Transfer Score = 4.  Patient performs 75% or more of  effort and minimal assistance (little/incidental help/steadying) for  transferring to and from the toilet/commode, requiring: Steadying. Contact  guard. Hand  placement.  Patient requires the following assistive device(s):  Walker.  Grab bars.    Education Provided:    Education Provided: Precautions. Pain management. Pain scale. Medication  options. Side effects. Safety issues and interventions. Fall protocol.  Supervision requirements. Use of adaptive devices. Safety issues and  interventions. Fall protocol. Supervision requirements. Use of adaptive devices.  Skin/wound care. Incision care. Critical pressure areas. Prevention of skin  breakdown. Signs/symptoms of infection. Breathing exercises. Incentive  spirometry.       Audience: Patient.       Mode: Explanation.  Demonstration.       Response: Verbalized understanding.  Needs reinforcement.  Discharge: Patient is not being discharged at this time.    Long Term Goals: Within 2 weeks, patient will be able verbalize 2/10 on pain  scale on a consistent basis for effective pain management.  Within in 2 weeks, patient will be able to state medication, indications, and  possible side effects.  Short Term Goals: Within 2 weeks, patient will be able to state medications  being taken along with indications, and possible side effects. - Goal Met  Within 2 weeks, patient will have effective pain management using medication as  prescribed, and verbalize 2/10 pain scale.    PROGRESS TOWARD GOALS: SHORT TERM GOAL REVIEW:       1. Pt will demonstrate MOD I with OOB and toilet transfers with safe and  timely mngt of AD to manage safe completion of task. - Not Met: ongoing training         2. Pt and family will demonstrate knowledge with monitoring surgical site  with acceptable signs of healing, s/s of infection and dressing care. - Not Met:  ongoing health teachings       3. Pt and family will be able to recall, identify scheduled meds to be  taken by the name, indication and at least one s/e. - Not Met: ongoing health  teachings  Time frame to achieve short term goal(s):  2 weeks from 10/03/2013   LONG TERM GOAL REVIEW:       1.  Pt directs and demo safe OOB transfers while maintaining 10% WB  precautions on the R LE 100% of the time to prevent surgical precautions. - Not  Met: ongoing       2. Pt and family (daughter) will be I and knowledgeable with wound  monitoring and dressing changes. - Not Met Hands- on training and health  teachings ongoing       3. Pt and family will state understanding of all prescribed meds while  referring to the active med list for discharge. - Not Met Health teachings  ongoing re medications  Timeframe to achieve long term goal(s):  2 weeks from 10/03/2013    PLAN: Nursing Specific Interventions  Medication Management. Pain Management. Skin Management. Wound Management.  Continue with the current Nursing Plan of Care.    TEAM CARE PLAN  Identified problems from team documentation:  Problem: Impaired Mobility  Mobility: Primary Team Goal: Pt will be indep with all bed mobility and  transfers with LRAD, indep with walking at least 20ft iwth LRAD, mod-max A for 4  steps with LRAD and HR/Active    Problem: Impaired Pain Management  Pain Mgmt: Primary Team Goal: Team will collaborate and work efficiently and  effectively in achieving manageable pain levels while participating in acute  rehab./    Problem: Impaired Self-care Mgmt/ADL/IADL  Self Care: Primary Team Goal: Patient will perform toileting transfer and task  with MOD I.  Caregiver will demo safe supervision for bathing and LB dressing  according to family training./    Add/Update Problems from this assessment:  No updates at this time.    Please review Integrated Patient View Care Plan Flowsheet for Team identified  Problems, Interventions, and Goals.    Signed by: Gordy Clement, RN3 10/07/2013 4:20:00 AM

## 2013-10-07 NOTE — Rehab Progress Note ([facility]) (Signed)
[name]  MRN: [id number]  Account: [id number]  Session Start: 10/07/2013 12:00:00 AM  Session Stop: 10/07/2013 12:00:00 AM    Rehabilitation Nursing  Inpatient Rehabilitation Shift Assessment    Rehab Diagnosis: Gait Disturbance  Demographics:            Age: 77Y            Gender: Female  Primary Language: english    Date of Onset:  09-05-13  Date of Admission: 10/03/2013 6:43:00 PM    Rehabilitation Precautions Restrictions:   Fall, skin, 10% WB RLE    Patient Report: Pt stated she had another sleepless nigh,t but thinks it because  she is in a new environment.  Pain: Patient currently without complaints of pain.  Pain Reassessment: Pain was not reassessed as no pain was reported.  Patient/Caregiver Goals:  I want to get better so that I can go home.    NEURO  Orientation/Awareness: Alert and Oriented x4.  Speech: Clear.  Behavior: Cooperative.    MEDICATIONS  IV Access: No IV access.  Dialysis Access: Patient does not have dialysis access.      Elopement Risk Level Assessment Tool  Patient Criteria: Patient is not capable of leaving the unit.  Assessment is not  applicable.      RISK ASSESSMENT FOR FALLS/INJURY    MENTAL STATUS CRITERIA:   0 - None identified.  MENTAL STATUS TOTAL: 0    AGE CRITERIA:   77 - 31-76 years old  AGE TOTAL: 1    ELIMINATION CRITERIA:   3 - Toileting with Assistance.   ELIMINATION TOTAL: 3    HISTORY OF FALLS CRITERIA:   2 - Unknown History.  HISTORY OF FALLS TOTAL: 2    MEDICATIONS CRITERIA:   2 or more High Risk Medications (*see list below)   MEDICATIONS TOTAL: 2    PHYSICAL MOBILITY CRITERIA:   1 - Weakness/impaired physical mobility.   3 - Decreased balance reaction.   PHYSICAL MOBILITY TOTAL: 4    FALLS RISK ASSESSMENT TOTAL: 12    Patient's Fall Risk: TOTAL SCORE >10: High Risk    Falls Interventions: Clutter removed and clear path to BR.  Call bell, phone, glasses, etc within reach.  Hourly toileting/safety checks between 6am and 10pm, then every 2 hours.   Hourly  rounding    NUTRITION  Diet:  Type: Regular.  Food Consistency: Regular.  Liquid Consistency: Thin.      CARDIOVASCULAR     Right Lower Extremity  Nail Bed Color: Pink.   Right knee swollen around incision site   Left Lower Extremity  Nail Bed Color: Pink.   No edema or redness present.  Pulses:   Apical Pulse: Regular. Strong. Rate is 68 .   Patient does not have a pacemaker.   Patient does not have a defibrillator.    CARDIOPULMONARY  Lung Sounds:   Upper lobes. Clear.   Lower lobes. Clear.  Type of Respirations: Regular.  Cough: No cough noted.  Respiratory Support: The patient does not require any respiratory support.  Respiratory Equipment: None. 92% R/A    INTEGUMENTARY  Skin:  Temperature: Warm  Turgor: Normal for age  Moisture: Dry  Color of skin: Normal for Race/Ethnicity  Capillary Refill: Less than 3 seconds  Wounds/Incisions:     Wound/incision not assessed this shift.   Wound Rn assessed and changed dressing today.  Braden Scale for Predicting Pressure Sore Risk: Sensory Perception: No  impairment  Moisture: Rarely moist  Activity: Chairfast  Mobility: Slightly limited  Nutrition: Adequate  Friction and Shear: Potential problem  Braden Score: 18  Level of Risk: At risk (15-18)   Assist patient to the bathroom every 2 hours    GASTROINTESTINAL  Abdomen: Soft. Nontender.  Bowel Sounds:  Active bowel sounds audible in all four quadrants.  Date of Last Bowel Movement:  10/07/13   No Problems/Complaints with Bowel Elimination Assessed.    GENITOURINARY  Current Bladder Pattern: Continent  Color:   Patient denies problems with urination and/or catheter.    MUSCULOSKELETAL  Upper Body:  Lower Body: RLE- RIGHT LOWER EXTREMITY 10% WB in extension. NO bending of R knee    Functional Measures    EATING: Eating Score = 7. Patient is completely independent for eating.  There  are no activity limitations.    TOILETING: Patient requires minimal assistance for adjusting clothing before  using a toilet, commode, bedpan,  or urinal. Patient requires no physical  assistance for hygiene. Patient requires moderate assistance for adjusting  clothing after using a toilet, commode, bedpan, or urinal. Patient performs  33.33 % of toileting tasks. Toileting Score = 2. Maximal Assistance.  Patient requires the following assistive device(s):  Grab bar.    BLADDER MANAGEMENT - LEVEL OF ASSIST: Bladder Score = 7. Patient is completely  independent for bladder management. There are no activity limitations.    BLADDER ACCIDENTS THIS SHIFT:  0 . Patient has not had an accident this  assessment and did not require medications or devices.    BOWEL MANAGEMENT - LEVEL OF ASSIST: Bowel Score = 6.  Patient is modified  independent for bowel management.  Patient did not have bowel movement.  Medication/intervention was provided. Stool softner    BOWEL ACCIDENTS THIS SHIFT: 0 . Patient has not had an accident, but used a  stool softener.    TRANSFERS BED/CHAIR/WHEELCHAIR: Bed/chair/wheelchair Transfer Score = 4.  Patient performs 75% or more of effort and minimal assistance (little/incidental  help/lifting of one limb/steadying) for transferring to and from the  bed/chair/wheelchair, requiring: Lifting one limb only. Steadying. Hand  placement.  Patient requires the following assistive device(s): Walker.    TRANSFER TOILET: Toilet Transfer Score = 4.  Patient performs 75% or more of  effort and minimal assistance (little/incidental help/steadying) for  transferring to and from the toilet/commode, requiring: Steadying. Hand  placement.  Patient requires the following assistive device(s):  Walker.    Education Provided:    Education Provided: Pain management. Pain scale. Medication options. Side  effects. Medication. Name and dosage. Administration. Purpose. Side Effects.       Audience: Patient.       Mode: Explanation.       Response: Verbalized understanding.    Discharge: Patient is not being discharged at this time.    Long Term Goals: Within 2 weeks,  patient will be able verbalize 2/10 on pain  scale on a consistent basis for effective pain management. - Goal Not Met  2 weeks from 10/03/2013  Short Term Goals: Within 2 weeks, patient will be able to state medications  being taken along with indications, and possible side effects. - Goal Not Met  2 weeks from 10/03/2013    PROGRESS TOWARD GOALS: Pt knows about 75% of medications, continueing with med  teaching on side effects    PLAN: Nursing Specific Interventions  Medication Management. Pain Management. Skin Management. Wound Management.  Continue with the current Nursing Plan of Care.    TEAM CARE  PLAN  Identified problems from team documentation:  Problem: Impaired Mobility  Mobility: Primary Team Goal: Pt will be indep with all bed mobility and  transfers with LRAD, indep with walking at least 93ft iwth LRAD, mod-max A for 4  steps with LRAD and HR/Active    Problem: Impaired Pain Management  Pain Mgmt: Primary Team Goal: Team will collaborate and work efficiently and  effectively in achieving manageable pain levels while participating in acute  rehab./    Problem: Impaired Self-care Mgmt/ADL/IADL  Self Care: Primary Team Goal: Patient will perform toileting transfer and task  with MOD I.  Caregiver will demo safe supervision for bathing and LB dressing  according to family training./Active    Add/Update Problems from this assessment:  No updates at this time.    Please review Integrated Patient View Care Plan Flowsheet for Team identified  Problems, Interventions, and Goals.    Signed by: Doretha Imus, RN 10/07/2013 3:45:00 PM

## 2013-10-07 NOTE — Rehab IRF Data Coll Rights Priv Act ([facility]) (Signed)
NAMEMETZLI POLLICK  MRN: [id number]  Account: [id number]  Session Start: 10/04/2013 12:00:00 AM  Session Stop: 10/04/2013 12:00:00 AM      IRFPPS Data Collection Rights and Rehab Privacy Act Notice: IRF-PAI Data  Collection Rights and Rehab Privacy Act Statement provided/given to patient and  family.    Signed by: Lane Hacker, OTR/L 10/04/2013 8:00:00 AM

## 2013-10-07 NOTE — Rehab Progress Note ([facility]) (Signed)
[name]  MRN: [id number]  Account: [id number]  Session Start: 10/06/2013 2:00:00 PM  Session Stop: 10/06/2013 3:00:00 PM    Physical Therapy  Inpatient Rehabilitation Progress Note - Brief    Rehab Diagnosis: Gait Disturbance  Demographics:            Age: 41Y            Gender: Female  Rehabilitation Precautions/Restrictions:   Fall, skin, 10% WB RLE    SUBJECTIVE    Pain: Patient currently without complaints of pain.    OBJECTIVE    Interventions:       Therapeutic Activities:  Pt received sitting OOB w/ RLE fully extended on  legrest, agreeable to rx session. Pt instructed in wheelchair pushups while  avoiding wb'ing on RLE. Sit to stands to RW w/ increased time and CGA. Gait  training w/ bariatric RW, min verbal cues to maintain 10% wb'ing on RLE and CGA  approx 15'. Pt reporting 3/10 pain at R shoulder after gait training. Provided moist  heat to R shoulder and instructed pt in LLE ther ex; activities to include:  hamstring curls, LAQs, and seated marching. After heat removed from R shoulder,  pt reporting R shoulder pain back to 0/10. Pt instructed in self propelling w/c  approx 200' on level surfaces.    ASSESSMENT  Mrs. Cubillos demo's decreased gait tolerance due to poor upper body strength and  decreased endurance. Anticipate w/c being pt's primary means of mobility upon  d/c for all MRADLs.    PLAN  Continued Physical Therapy is recommended.  Recommended Frequency/Duration/Intensity: 1-2 hrs/day, 5-6 days/wk for 2 weeks  from evaluation on 10/04/13  Activities Contributing Toward Care Plan: Bed mobility training, transfer  training, balance training, gait training, stair training, therapeutic  strengthening exercises, therapeutic activities, ROM exercises, endurance  activities, family/caregiver training, instruction in home exercise program,  education for home modifications, procurement of DME.    3 Hour Rule Minutes: 60 minutes of PT treatment this session count towards  intensity and duration of  therapy requirement. Patient was seen for the full  scheduled time of PT treatment this session.    Signed by: Honor Junes, DPT, PT 10/06/2013 3:00:00 PM

## 2013-10-07 NOTE — Rehab Progress Note ([facility]) (Signed)
NAMELEXIANNA WEINRICH  MRN: [id number]  Account: [id number]  Session Start: 10/07/2013 12:00:00 AM  Session Stop: 10/07/2013 12:00:00 AM    SEVERE SEPSIS SCREEN  INFECTION:  Patient has documented infection. Is on antibiotic therapy (not  prophylaxis).    SYSTEMATIC IMFLAMMATORY RESPONSE SYNDROME (SIRS):  Patient  has no indication of  systematic inflammatory response syndrome (SIRS). Negative Sepsis Screen.  If you are unable to assess a system's dysfunction because you do not have labs,  or the labs you have are not current (within 24 hours), call physician and  request and order for the lab tests needed.    Signed by: Parks Neptune, RN 10/07/2013 8:55:00 PM

## 2013-10-07 NOTE — Rehab Progress Note ([facility]) (Signed)
[name]  MRN: [id number]  Account: [id number]  Session Start: 10/07/2013 1:00:00 PM  Session Stop: 10/07/2013 1:35:00 PM    Occupational Therapy  Inpatient Rehabilitation Progress Note - Brief    Rehab Diagnosis: Gait Disturbance  Demographics:            Age: 61Y            Gender: Female  Rehabilitation Precautions/Restrictions:   Fall, skin, 10% WB RLE    SUBJECTIVE  Patient Report: Patient reported that she feels nauseous.  Pain: 3/10--R knee    OBJECTIVE  Interventions:       Therapeutic Activities:  Treatment focused on education w/ hip kit, gross  motor transition movements, and transfer training w/ RW before patient reported  that she is too nauseous to continue w/ therapy. OT reported this info to RN.  Patient left in bed w/ call button in reach.    ASSESSMENT  Patient demo'd slightly increased tolerance for gross motor transitional  movements but overall activity tolerance is still quite limited.    PLAN  Continued Occupational Therapy is recommended.  Recommended Frequency/Duration/Intensity: 1-2 hours/day, 5-7days/week, for 14  days s/p eval on 10/05/13.  Continued Activities Contributing Toward Care Plan: Therapeutic exercise,  therapeutic activity, ADL/transfer training, balance training, endurance  training, pt/family education    3 Hour Rule Minutes: 35 minutes of OT treatment this session count towards  intensity and duration of therapy requirement. Patient was not seen for the full  scheduled time of OT treatment this session secondary to: patient feeling too  nauseous to continue w/ therapy  Will attempt to see patient tomorrow,  as scheduled    Signed by: Alita Chyle, OTR/L 10/07/2013 1:35:00 PM

## 2013-10-07 NOTE — Rehab PPS CMG ([facility]) (Signed)
[name]  MRN: [id number]  Account: [id number]  Session Start: 10/05/2013 12:00:00 AM  Session Stop: 10/05/2013 12:00:00 AM    PPS CMG Coordinator  Inpatient Rehabilitation Admission    IRF Admission Date:  10/03/2013      Admission Class: Initial Rehab.  [facility]  Pre-Hospital Living: Home. Pre-Hospital Living  With: (2) Family/Relatives.  Payer Source: Primary: Medicare - Medicare Advantage  Secondary: Not Listed.  Additional Information: None.  Ethnic Group:  Leisure centre manager.  Marital Status:  Marital Status: Widowed.    Impairment Group: 08.61 Status Post Unilateral Knee Replacement  Etiologic Diagnosis Code:   Code      Description                                           Date of Onset  996.67    INFECT D/T ORTH DEV NEC                               09/22/2013    Comorbidities:   Rank Code      Description  1    278.01    MORBID OBESITY      PAI Bladder Accidents:  0 - Accidents.    Patient used medications/device this  shift.  10/07/2013 4:20:00 AM  Bladder Score = 6. Patient has not had an accident, but uses a  device/medication.  PAI Bowel Accident: 0 -Accidents.    Patient used medications/device this shift.   10/03/2013 9:00:00 PM  Bowel Score = 6. Patient has no accidents, but uses a device/medications.    MEDICAL NEEDS  Height on Admission: 63 inches.  Weight on Admission: 246 pounds.  Swallowing Status: Swallowing Status: Regular Food: solids and liquids swallowed  safely without supervision or modified food consistencies.    QUALITY INDICATORS  Pressure Ulcers: Unhealed Pressure Ulcer(s) Present on Admission: No.  Pressure Ulcer Risk Condition on Admission: Peripheral Vascular Disease (PVD):  No.  Peripheral Arterial Disease (PAD): No.  Diabetes Mellitus (DM): No.    Signed by: Lane Hacker, OTR/L 10/05/2013 4:00:00 PM

## 2013-10-07 NOTE — Rehab Progress Note ([facility]) (Signed)
NAMELAJADA JANES  MRN: [id number]  Account: [id number]  Session Start: 10/07/2013 3:05:00 PM  Session Stop: 10/07/2013 4:35:00 PM    Physical Therapy  Inpatient Rehabilitation Progress Note - Brief    Rehab Diagnosis: Gait Disturbance  Demographics:            Age: 26Y            Gender: Female  Rehabilitation Precautions/Restrictions:   Fall, skin, 10% WB RLE    SUBJECTIVE  Patient Report: Pt c/o soreness in B shoulders developed over past 3 days,  improved w/ issuing different w/c that allows for use of LLE to assist w/ w/c  propulsion and assist  w/ UE's in painfree range.  Pain: Refer to Pt Report above    OBJECTIVE    Interventions:       Therapeutic Activities:  Rx w/ son present to address B shoulder discomfort  w/ technique above under Pt Report.  Performed RLE edema mgt thru positioning  and tactile input to encourage venous return.  Practiced gait tr w/ a variety of  assistive devices(Swedish walker -->B Platform RW---> RW 31/5" high) up to 15 ft  w/ min assist to prevent breaking WB'ing precautions RLE emphasizing UE's  supporting body wt to allow for more normal swing w/ RLE vs "hopping on" on LLE.   Discussed w/ pt and son the negative impact her WB'ing restrictions will have  on ability to negotiate stairs post- d/c.  Practiced bed mob w/ S, bed to w/c  and mat transfers w/ set-up and CGA, all  w/TTWB RLE and pt I'ly maintaining RLE  in extension.  Practiced w/c to over the toilet BSC transfer w/ mod assist.  Left pt on toilet per her request and nsg made aware.  Pain Reassessment: Refer to Pt Report above    Education Provided:    Education Provided: Refer to Interventions above .       Audience: Pt and son .       Mode: Explanation.  Demonstration.   Practice.       Response: Verbalized understanding.  Demonstrated skill.  Needs practice.  Needs reinforcement.    ASSESSMENT  Alternative w/c eliminated motion that was contributing to shoulder discomfort  during propulsion.  Pt is developing ability to  support wt thru UE's on RW to  allow for more efficient ambulation and maintain WB'ing precautions but still  limited in distance to 15 ft and requiring assist as above which is non-func for  ambulation w/in the home.    PLAN  Continued Physical Therapy is recommended.  Recommended Frequency/Duration/Intensity: 1-2 hrs/day, 5-6 days/wk for 2 weeks  from evaluation on 10/04/13  Activities Contributing Toward Care Plan: Bed mobility training, transfer  training, balance training, gait training, stair training, therapeutic  strengthening exercises, therapeutic activities, ROM exercises, endurance  activities, family/caregiver training, instruction in home exercise program,  education for home modifications, procurement of DME.    3 Hour Rule Minutes: 90 minutes of PT treatment this session count towards  intensity and duration of therapy requirement. Patient was seen for the full  scheduled time of PT treatment this session.    Signed by: Edrick Oh, PT 10/07/2013 4:35:00 PM

## 2013-10-07 NOTE — Progress Notes (Signed)
IRF Physiatry Attending Face to Face Progress Note    Date Time: 10/07/2013 5:14 PM  Patient Name: [name], [name]    Admission date:  10/03/2013    Functional Status/Update:     [x] I I reviewed patient's therapy notes to assess functional status and ongoing need for therapies.   Of note:  Treatment focused on education w/ hip kit, gross   motor transition movements, and transfer training w/ RW      UB dressing- S  LB dressing - min A  Transfers- min A    []  Patient discussed at length today at inter-disciplinary team rounds-conference ;report is pending in [facility]  Subjective and interval development:   Patient c/o insomnia and B shoulder pain, 3-4/10    Review of Systems:   A comprehensive review of systems was: No fevers, chills, nausea, vomiting, chest pain, shortness of breath, cough, headache, double vision    Objective:   Vitals:  Filed Vitals:    10/06/13 0558 10/06/13 1050 10/06/13 2014 10/07/13 0629   BP: 155/66 147/65 155/65 134/78   Pulse: 69 67 62 68   Temp: 97.9 F (36.6 C)  97 F (36.1 C) 98.8 F (37.1 C)   Resp: 18 18 18 18    SpO2: 96%  98% 92%     Physical Examination:  General: sitting in the WC  CV:+S1S2 Heart rate and rhythm are regular.   Pulm:Lungs are clear to auscultation bilaterally.   QIO:NGEX. Non-tender. Normoactive bowel sounds.   Skin: R knee incision with serosang drainage   Neuro : alert, smiling; oriented  .Sensation to LT is intact.  MMS; mild tenderness at B deltoids;   MMT- grossly 5/5 except R KF/KE- NT due to immobilization  Extremities: no calf tenderness  New Labs:   No results found for this basename: GLUCOSEWHOLE:10 in the last 24 hours      Lab 10/06/13 1629   WBC 9.17   HGB 10.4*   HCT 33.4*   PLT 301          Lab 10/06/13 1629   NA 142   K 3.6   CL 102   CO2 31*   BUN 16   CREAT 0.8   CA 9.2   ALB 2.9*   PROT 6.5   BILITOTAL 0.7   ALKPHOS 82   ALT 8   AST 15   GLU 113*   MG --   PHOS --   SED -53; CRP 6.2  UA; large LE  UC- E Coli  No MRSA  No results found for this  basename: INR:2,APTT:2 in the last 168 hours       Rads:       Current medications:     Scheduled Meds: PRN Meds:           amLODIPine 10 mg Oral Daily   bisoprolol 5 mg Oral Daily   celecoxib 200 mg Oral Daily   ciprofloxacin 750 mg Oral Q12H SCH   cloNIDine 0.1 mg Oral Daily   docusate sodium 100 mg Oral Q12H SCH   ferrous sulfate 324 mg-ascorbic acid 500 mg combo dose  Oral Daily   gabapentin 300 mg Oral Q12H SCH   hydrochlorothiazide 25 mg Oral Daily   lidocaine 2 patch Transdermal Q24H   polycarbophil 1,250 mg Oral Daily   potassium chloride 10 mEq Oral Daily   Senna 17.2 mg Oral QHS   sulfamethoxazole-trimethoprim 1 tablet Oral Q12H SCH   tiotropium 18 mcg Inhalation QAM   traMADol  50 mg Oral Q6H SCH   [DISCONTINUED] polyethylene glycol 17 g Oral Daily       Continuous Infusions:         albuterol 2.5 mg Q4H PRN   bisacodyl 10 mg QD PRN   diphenhydrAMINE 25 mg Q6H PRN   HYDROcodone-acetaminophen 1 tablet Q4H PRN   HYDROcodone-acetaminophen 2 tablet Q6H PRN   hydrOXYzine 25 mg Q12H PRN   naloxone 0.1 mg PRN   ondansetron 4 mg Q8H PRN   oxyCODONE 10 mg Q12H PRN   polyethylene glycol 17 g QD PRN   senna-docusate 2 tablet BID PRN   traZODone 100 mg QHS PRN   zolpidem 5 mg QHS PRN         Assessment:   77 y.o. 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/25/13, revisions on 09/05/13, 12/8 /14, 09/22/13 with muscle flap/ skin grafting on 09/22/13 :incision monitoring, daily dressing change, 10% WB to R LE; cipro through 10/27/12   Patient must keep R knee extended at all times- should not use knee immob due to pressure  Will request plastic surgery and  wound nurse consult  Poor wound healing: Juven ,Boost Breeze     Insomnia: trazodone added     Pain: lidoderm to B shoulders    Constipation : controlled; Miralax changed to PRN    COPD: spiriva, albuterol, IS     E Coli UTI: resistant to Cipro, will add Bactrim      Coordination of care:  Care coordinated with  PT    Discharge planning: home    Signed by: Earl Many DO     [place] ([place]

## 2013-10-08 NOTE — Rehab Progress Note ([facility]) (Signed)
NAMEDOYLE TEGETHOFF  MRN: [id number]  Account: [id number]  Session Start: 10/08/2013 12:00:00 AM  Session Stop: 10/08/2013 12:00:00 AM    SEVERE SEPSIS SCREEN  INFECTION:  Patient has documented infection. Is on antibiotic therapy (not  prophylaxis).    SYSTEMATIC IMFLAMMATORY RESPONSE SYNDROME (SIRS):  Patient  has no indication of  systematic inflammatory response syndrome (SIRS). Negative Sepsis Screen.  If you are unable to assess a system's dysfunction because you do not have labs,  or the labs you have are not current (within 24 hours), call physician and  request and order for the lab tests needed.    Signed by: Gordy Clement, RN3 10/08/2013 11:12:00 PM

## 2013-10-08 NOTE — Progress Notes (Signed)
[facility] Physiat[facility] Attending Face to Face Progress Note    Date Time: 10/08/2013 11:22 PM  Patient Name: [name], [name]    Admission date:  10/03/2013    Functional Status/Update:     [x] I I reviewed patient's therapy notes to assess functional status and ongoing need for therapies.   Of note:  Squat pivot transfer onto   mat w/ CGA for safety. Pt able to transition from sit to supine with supervision   only, while managing RLE. Pt instructed in LLE ther in supine; activities to   include: heel slides, SLRs, hip abduction, and SAQ for 2 sets of 10 reps. Supine   to sit from wedge with increased and SBA. Pt instructed in self propelling w/c approx   75' w/ LLE only to promote strength      [x]  Patient discussed at length today at inter-disciplinary team rounds-conference ;report is pending in [facility]  Subjective and interval development:   Patient states knee pain is controlled at 3-4/10; shoulder pain has decreased; cont to c/o insomnia; patient is not taking sleeping meds    Review of Systems:   A comprehensive review of systems was: No fevers, chills, nausea, vomiting, chest pain, shortness of breath, cough, headache, double vision    Objective:   Vitals:  Filed Vitals:    10/07/13 2003 10/08/13 0533 10/08/13 1103 10/08/13 2035   BP: 121/69 112/57 120/58 127/58   Pulse: 67 66  63   Temp: 96.8 F (36 C) 98.6 F (37 C)  96.1 F (35.6 C)   Resp: 18 18  18    SpO2: 94% 92%  96%     Physical Examination:  General: sitting in the WC  CV:+S1S2 Heart rate and rhythm are regular.   Pulm:Lungs are clear to auscultation bilaterally.   ZOX:WRUE. Non-tender. Normoactive bowel sounds.   Skin: R knee incision with serosang drainage   Neuro : alert, smiling; oriented  .Sensation to LT is intact.  MMS; mild tenderness at B deltoids;   MMT- grossly 5/5 except R KF/KE- NT due to immobilization  Extremities: no calf tenderness  New Labs:   No results found for this basename: GLUCOSEWHOLE:10 in the last 24 hours      Lab 10/06/13 1629    WBC 9.17   HGB 10.4*   HCT 33.4*   PLT 301          Lab 10/06/13 1629   NA 142   K 3.6   CL 102   CO2 31*   BUN 16   CREAT 0.8   CA 9.2   ALB 2.9*   PROT 6.5   BILITOTAL 0.7   ALKPHOS 82   ALT 8   AST 15   GLU 113*   MG --   PHOS --   SED -53; CRP 6.2  UA; large LE  UC- E Coli  No MRSA  No results found for this basename: INR:2,APTT:2 in the last 168 hours       Rads:       Current medications:     Scheduled Meds: PRN Meds:           amLODIPine 10 mg Oral Daily   bisoprolol 5 mg Oral Daily   ciprofloxacin 750 mg Oral Q12H SCH   cloNIDine 0.1 mg Oral Daily   docusate sodium 100 mg Oral Q12H SCH   ferrous sulfate 324 mg-ascorbic acid 500 mg combo dose  Oral Daily   gabapentin 300 mg Oral Q12H [facility]  hydrochlorothiazide 25 mg Oral Daily   lidocaine 2 patch Transdermal Q24H   polycarbophil 1,250 mg Oral Daily   potassium chloride 10 mEq Oral Daily   Senna 17.2 mg Oral QHS   sulfamethoxazole-trimethoprim 1 tablet Oral Q12H SCH   tiotropium 18 mcg Inhalation QAM   traMADol 50 mg Oral Q6H SCH       Continuous Infusions:         albuterol 2.5 mg Q4H PRN   bisacodyl 10 mg QD PRN   diphenhydrAMINE 25 mg Q6H PRN   HYDROcodone-acetaminophen 1 tablet Q4H PRN   HYDROcodone-acetaminophen 2 tablet Q6H PRN   hydrOXYzine 25 mg Q12H PRN   naloxone 0.1 mg PRN   ondansetron 4 mg Q8H PRN   oxyCODONE 10 mg Q12H PRN   polyethylene glycol 17 g QD PRN   senna-docusate 2 tablet BID PRN   traZODone 100 mg QHS PRN   zolpidem 5 mg QHS PRN         Assessment:   77 y.o. 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/25/13, revisions on 09/05/13, 12/8 /14, 09/22/13 with muscle flap/  skin grafting on 09/22/13 :incision monitoring, daily  therahoney dressing change, 10% WB to R LE; cipro through 10/27/12   Patient must keep R knee extended at all times- should not use knee immob due to pressure   wound nurse consult appreciated  Poor wound healing: Juven ,Boost Breeze     Insomnia: trazodone PRN started     Pain: lidoderm to B shoulders    Constipation : controlled; Miralax changed to PRN  COPD: spiriva, albuterol, IS     E Coli UTI: resistant to Cipro,  Bactrim started       Coordination of care:  Care coordinated with  PT/OT/CM    Discharge planning: home    Signed by: Earl Many DO     [facility]

## 2013-10-08 NOTE — Rehab Progress Note ([facility]) (Signed)
[name]  MRN: [id number]  Account: [id number]  Session Start: 10/08/2013 12:00:00 AM  Session Stop: 10/08/2013 12:00:00 AM    Rehabilitation Nursing  Inpatient Rehabilitation Shift Assessment    Rehab Diagnosis: Gait Disturbance  Demographics:            Age: 77Y            Gender: Female  Primary Language: english    Date of Onset:  09-05-13  Date of Admission: 10/03/2013 6:43:00 PM    Rehabilitation Precautions Restrictions:   Fall, skin, 10% WB RLE    Patient Report: "Am doing well"  Pain: Patient currently without complaints of pain.  Pain Reassessment: Pain was not reassessed as no pain was reported.  Patient/Caregiver Goals:  I want to get better so that I can go home.    NEURO  Orientation/Awareness: Alert and Oriented x4.  Speech: Clear.  Behavior: Cooperative.    MEDICATIONS  IV Access: No IV access.  Dialysis Access: Patient does not have dialysis access.      Elopement Risk Level Assessment Tool  Patient Criteria: Patient is not capable of leaving the unit.  Assessment is not  applicable.      RISK ASSESSMENT FOR FALLS/INJURY    MENTAL STATUS CRITERIA:   0 - None identified.  MENTAL STATUS TOTAL: 0    AGE CRITERIA:   77 - 32-23 years old  AGE TOTAL: 1    ELIMINATION CRITERIA:   3 - Toileting with Assistance.   ELIMINATION TOTAL: 3    HISTORY OF FALLS CRITERIA:   10 - Has fallen two or more times.  HISTORY OF FALLS TOTAL: 10    MEDICATIONS CRITERIA:   2 or more High Risk Medications (*see list below)   MEDICATIONS TOTAL: 2    PHYSICAL MOBILITY CRITERIA:   1 - Weakness/impaired physical mobility.   3 - Decreased balance reaction.   PHYSICAL MOBILITY TOTAL: 4    FALLS RISK ASSESSMENT TOTAL: 20    Patient's Fall Risk: TOTAL SCORE >10: High Risk    Falls Interventions: Clutter removed and clear path to BR.  Call bell, phone, glasses, etc within reach.  Initiate Fall care plan and outcome.  Pt and family education.  Bed alarm    NUTRITION  Diet:  Type: Regular.  Food Consistency: Regular.  Liquid  Consistency: Thin.      CARDIOVASCULAR     Bilateral lower extremities  Nail Bed Color: Pink.   No edema or redness present.  Pulses:   Apical Pulse: Regular. Strong. Rate is 67 .   Patient does not have a pacemaker.   Patient does not have a defibrillator.    CARDIOPULMONARY  Lung Sounds:   Upper lobes. Clear.   Lower lobes. Clear.  Type of Respirations: Regular.  Cough: No cough noted.  Respiratory Support: The patient does not require any respiratory support.  Respiratory Equipment: None.    INTEGUMENTARY  Skin:  Temperature: Warm  Turgor: Normal for age  Moisture: Dry  Color of skin: Normal for Race/Ethnicity  Capillary Refill: Less than 3 seconds  Wounds/Incisions: No wounds or incisions.  Braden Scale for Predicting Pressure Sore Risk: Sensory Perception: No  impairment  Moisture: Rarely moist  Activity: Chairfast  Mobility: Very limited  Nutrition: Adequate  Friction and Shear: Potential problem  Braden Score: 17  Level of Risk: At risk (15-18)   Pressure Relief every 30 minutes while in wheelchair   Turn patient every 2 hours  GASTROINTESTINAL  Abdomen: Soft. Nontender.  Bowel Sounds:  Active bowel sounds audible in all four quadrants.  Date of Last Bowel Movement:   No Problems/Complaints with Bowel Elimination Assessed.    GENITOURINARY  Current Bladder Pattern: Continent  Color:  Yellow   Patient denies problems with urination and/or catheter.    MUSCULOSKELETAL  Upper Body:  Lower Body: RLE- RIGHT LOWER EXTREMITY 10% WB in extension. NO bending of R knee    Functional Measures    EATING: Eating Score = 6. Patient is modified independent for eating, requiring:  Safety considerations.    TOILETING: Toileting Score = 4.  Patient requires minimal assistance for  toileting, such as steadying for balance while cleansing or adjusting clothes.  Patient requires the following assistive device(s):  Adaptive device to maintain  balance.  Grab bar.    TRANSFER TOILET: Toilet Transfer Score = 4.  Patient performs  75% or more of  effort and minimal assistance (little/incidental help/steadying) for  transferring to and from the toilet/commode, requiring: Steadying. Contact  guard. Hand placement.  Patient requires the following assistive device(s):  Walker.    BLADDER MANAGEMENT - LEVEL OF ASSIST: Bladder Score = 7. Patient is completely  independent for bladder management. There are no activity limitations.    BLADDER ACCIDENTS THIS SHIFT:  0 . Patient has not had an accident this  assessment and did not require medications or devices.    BOWEL MANAGEMENT - LEVEL OF ASSIST: Bowel Score = 6.  Patient is modified  independent for bowel management.  Patient did not have bowel movement.  Medication/intervention was provided.   There was no bowel accident.    BOWEL ACCIDENTS THIS SHIFT: 0 . Patient has not had an accident, but used a  non-natural laxative.    COMPREHENSION: Auditory comprehension is the usual mode. Patient does not  comprehend complex/abstract information in their primary language without  assistance from a helper. Comprehension Score = 2, Maximal Prompting. Patient  comprehends basic daily needs 25-49% of the time. Patient understands simple  information via single words or gestures. Requires maximal/a lot of prompting  (most of the time).  Patient has the following assistive device(s) or limitations: No assistive  devices were required.    Education Provided:    Education Provided: Safety issues and interventions. Impaired sensation. Use of  adaptive devices. Impaired coordination.       Audience: Patient.       Mode: Demonstration.       Response: Verbalized understanding.    Discharge: Patient is not being discharged at this time.    Long Term Goals: Within 2 weeks, patient will be able verbalize 2/10 on pain  scale on a consistent basis for effective pain management. - Goal Not Met  2 weeks from 10/03/2013  Short Term Goals: Within 2 weeks, patient will be able to state medications  being taken along with  indications, and possible side effects. - Goal Not Met  2 weeks from 10/03/2013    PROGRESS TOWARD GOALS:    PLAN: Nursing Specific Interventions  Medication Management. Pain Management. Skin Management. Wound Management.  Continue with the current Nursing Plan of Care.    TEAM CARE PLAN  Identified problems from team documentation:  Problem: Impaired Mobility  Mobility: Primary Team Goal: Pt will be indep with all bed mobility and  transfers with LRAD, indep with walking at least 48ft iwth LRAD, mod-max A for 4  steps with LRAD and HR/Active    Problem: Impaired  Pain Management  Pain Mgmt: Primary Team Goal: Team will collaborate and work efficiently and  effectively in achieving manageable pain levels while participating in acute  rehab./    Problem: Impaired Self-care Mgmt/ADL/IADL  Self Care: Primary Team Goal: Patient will perform toileting transfer and task  with MOD I.  Caregiver will demo safe supervision for bathing and LB dressing  according to family training./Active    Add/Update Problems from this assessment:  No updates at this time.    Please review Integrated Patient View Care Plan Flowsheet for Team identified  Problems, Interventions, and Goals.    Signed by: Parks Neptune, RN 10/08/2013 2:00:00 AM

## 2013-10-08 NOTE — Rehab Progress Note ([facility]) (Signed)
[name]  MRN: [id number]  Account: [id number]  Session Start: 10/08/2013 10:00:00 AM  Session Stop: 10/08/2013 11:00:00 AM    Therapeutic Recreation  Inpatient Rehabilitation Progress Note    Rehab Diagnosis: Gait Disturbance  Demographics:            Age: 50Y            Gender: Female  Rehabilitation Precautions/Restrictions:   Fall, skin, 10% WB RLE    SUBJECTIVE  Patient Reports: "Its been a long time since I played this."  Patient/Caregiver Goals:  I want to get better so that I can go home.  Pain: Patient currently without complaints of pain.    OBJECTIVE    Interventions: The following treatment was provided:  Leisure Education:  Pt. participated in ceramic activity and card games.  Pain Reassessment: Pain was not reassessed as no pain was reported.    Education Provided:    Education Provided: Benefit and value of leisure activity.       Audience: Patient.       Mode: Explanation.       Response: Applied knowledge.    ASSESSMENT  Pt. seen in TR with focus on leisure education and socialization. Pt.  participated in ceramic activity and engaged in card game of remote premorbid  familiarity. Pt. enjoyed re-learning old game and was able to participate in  activity with mod I after initial instruction. Very pleasant and social,  benefiting from interactions for support and encouragement.  Progress Towards Goals:    PLAN  Therapeutic Recreation services are recommended to address: leisure maintenance,  education, social opportunities    Care Plan  Identified problems from team documentation:  Problem: Impaired Mobility  Mobility: Primary Team Goal: Pt will be indep with all bed mobility and  transfers with LRAD, indep with walking at least 22ft iwth LRAD, mod-max A for 4  steps with LRAD and HR/Active    Problem: Impaired Pain Management  Pain Mgmt: Primary Team Goal: Team will collaborate and work efficiently and  effectively in achieving manageable pain levels while participating in  acute  rehab./    Problem: Impaired Self-care Mgmt/ADL/IADL  Self Care: Primary Team Goal: Patient will perform toileting transfer and task  with MOD I.  Caregiver will demo safe supervision for bathing and LB dressing  according to family training./Active    Add/Update Problems from this Treatment:  No updates at this time.    Please review Integrated Patient View Care Plan Flowsheet for Team identified  Problems, Interventions, and Goals.    Signed by: Pearson Forster, CTRS 10/08/2013 11:00:00 AM

## 2013-10-08 NOTE — Rehab Evaluation ([facility]) (Signed)
[name]  MRN: [id number]  Account: [id number]  Session Start: 10/08/2013 12:00:00 AM  Session Stop: 10/08/2013 12:00:00 AM    Nutrition  Inpatient Rehabilitation Initial Assessment    Rehab Diagnosis: Gait Disturbance  Demographics:            Age: 77Y            Gender: Female  Primary Language: english    Past Medical History: L. total knee replacement  History of Present Illness: This 77 y.o. year old female with history of R TKA  on 08/25/13 by Dr. Ahmed Prima was readmitted to [facility] on 09/05/13 and underwent  revisions on 09/05/13, 12/8 /14, 09/22/13 with muscle flap/ skin grafting by  plastic surgery on 09/22/13. The patient is to keep leg extended ; current WB  status is 10 %    Wound culture was positive for Proteus. Per ID ,continue Cipro through 10/27/13            Date of Onset: 09-05-13            Date of Admission: 10/03/2013 6:43:00 PM    Medications and Allergies: Significant rehabilitation considerations:   NKA  Rehabilitation Precautions/Restrictions:   Fall, skin, 10% WB RLE    SUBJECTIVE  Patient Reports: I'm just not that hungry, didn't eat but bites for lunch  Social History:  Marital Status: Single  Children: daughter          Reside:  pt. lives with daughter  Employment Status:  retired  Facilities manager Activities/Hobbies:  Patient/Caregiver Goals:  Patient's functional goals: I want to get better so  that I can go home.  Pain: Patient currently without complaints of pain.    OBJECTIVE  Labs:   Glucose in BMP and Serial Glucose Monitoring: 113   Albumin: 2.9  Weight: 246 pounds. 111.82 kilograms.  Height:  63 inches. 1.6 meters.  BMI: 43.68 kilogram per meters squared.  Weight Group:  Obese Class III  Admission Weight: 246    Usual Weight: 235 pounds.  Ideal Body Weight: 115 pounds. 52.27 kilograms.  Percent Ideal Body Weight: Patient's current weight is 204 % of ideal body  weight.  Weight Change: none    Diet Intake Prior to Admission: regular  Supplements Prior to Admission:  NA  Herbals/Vitamins Prior to Admission:  NA    Food Allergies/Intolerances: No known food allergies.  Religious/Cultural Food Practices: NA  Current Medical/Nutrition Therapy: Diet: Regular.   diet with no liquid consistency restrictions. Oral Supplements: Boost Breeze 1  daily    % of Meals Consumed: 25 %-50%  Feeding Modality and Dentition: oral  Current Problems/GI Symptoms: Nausea.  Wounds/Incisions:  knee incision front and back  Energy Needs Calculation: [place]: Female    Estimated Nutrition and Fluids Needs:  1580 calories (14 kcal/kg, Mif[place]in x 1.0)  112-134 grams of Protein (1.0-1.2 grams/kg)  1580 ml fluids (1 ml/kcal)  200 gm CHO (50% total kcal)    ASSESSMENT  Assessment of Nutritional Status:  Current Nutrition Intake: Inadequate.  Current Nutrition Status: Moderately compromised.  Nutritional Risk Level:  Low  Nutrition Diagnosis:   NI-2.1  Inadequate Oral Food/Beverage Intake.   related to poor appetite and nausea as evidenced by pt meal intake usually less  than 50% and slow wound healing.  Overall Assessment/Recommendations: Rec: MVI/MIN for wound healing. Rec: Juven  BID in juice for wound healing.    Barriers to Progress/Discharge: No potential barriers to progress.    Interventions Provided:   Assessment Completed.  Nutritional Adequacy Assessed:   Tolerance to Current Nutrition Therapy Assessed:   PO Intake Encouraged:  Pain Reassessment: Pain was not reassessed as no pain was reported.    Interdisciplinary Educational Needs and Learning Preferences:  Education not  assessed/provided this session.    Education Provided:  No education provided this session.    Long Term Goals:  Not applicable.  Short Term Goals:  Time frame to achieve short term goal(s): Pt to increase  intake to 60% of meals and supplements within 1 visit.    PLAN  Recommendations for Follow-up: Will continue to monitor po intake and labs  within 7 days.  Rec: MVI/MIN and Juven in juice BID for wound healing.    Care  Plan  Identified problems from team documentation:  Problem: Impaired Mobility  Mobility: Primary Team Goal: Pt will be indep with all bed mobility and  transfers with LRAD, indep with walking at least 88ft iwth LRAD, mod-max A for 4  steps with LRAD and HR/Active    Problem: Impaired Pain Management  Pain Mgmt: Primary Team Goal: Team will collaborate and work efficiently and  effectively in achieving manageable pain levels while participating in acute  rehab./    Problem: Impaired Self-care Mgmt/ADL/IADL  Self Care: Primary Team Goal: Patient will perform toileting transfer and task  with MOD I.  Caregiver will demo safe supervision for bathing and LB dressing  according to family training./Active    Identified problems from this assessment:     No problems identified at this time.    Please review Integrated Patient View Care Plan Flowsheet for Team identified  Problems, Interventions, and Goals.    Signed by: Leeanne Deed, RD 10/08/2013 1:52:00 PM

## 2013-10-08 NOTE — Rehab Progress Note ([facility]) (Signed)
[name]  MRN: [id number]  Account: [id number]  Session Start: 10/08/2013 9:10:00 AM  Session Stop: 10/08/2013 10:10:00 AM    Physical Therapy  Inpatient Rehabilitation Progress Note - Brief    Rehab Diagnosis: Gait Disturbance  Demographics:            Age: 63Y            Gender: Female  Rehabilitation Precautions/Restrictions:   Fall, skin, 10% WB RLE, (R) knee must remain extended at all times - fused.    SUBJECTIVE  Patient Report: "I learned how to do that lift yesterday, but I don't seem to be  able to do it again today."  Pain: Pt did not complain of pain in (R) knee, but did report of shoulder  soreness R>L.  Stated soreness developed recently, increases with w/c  propulsion.    OBJECTIVE    Interventions:       Therapeutic Activities:  Session focused on transfer training, RLE  management, ther ex for BLE, and gait training.  Pt is able to manage RLE into  and out of bed mod I with extra time required.  She transfers via stand-pivot  using RW, maintaining NWB through RLE.  Instructed in AP, QS, and GS and able to  perform 10 reps each.  Pt reported cramping in (R) adductors when attempting SLR  and hip abd/add with A.  Able to manage transition between sitting and supine on  edge of mat.  Gait training with emphasis on achieving lift prior to swing.  Able to walk distance of  18 feet maintaining NWB through RLE (states unable to  perform TTWB (R) within limits allowed.)  Following demo for improving  technique, pt able to achieve improved lift with longer step length (about 8  inches compared to 3-4 inches on first attempt). Fatigued after short distance -  about 10 feet.  Self-propelled w/c greater than 150 feet using BUE, and later,  greater than 70 feet accomplishing change of direction and avoidance of  obstacles.  Hand-off to Therapeutic Rec for session to immediately follow,.  Ice  bag provided for (R) knee as recommended by physician.  All needs met.    ASSESSMENT  Pt improved lift technique  with longer step lengths following demo, but has poor  endurance for distance amb.  Continues to amb using NWB technique due to  difficulty maintaining TTWB restrictions.  Progressing nicely with transfers and  RLE management.    PLAN  Continued Physical Therapy is recommended.  Recommended Frequency/Duration/Intensity: 1-2 hrs/day, 5-6 days/wk for 2 weeks  from evaluation on 10/04/13  Activities Contributing Toward Care Plan: Bed mobility training, transfer  training, balance training, gait training, stair training, therapeutic  strengthening exercises, therapeutic activities, ROM exercises, endurance  activities, family/caregiver training, instruction in home exercise program,  education for home modifications, procurement of DME.    3 Hour Rule Minutes: 60 minutes of PT treatment this session count towards  intensity and duration of therapy requirement. Patient was seen for the full  scheduled time of PT treatment this session.    Signed by: Redmond Baseman, PT 10/08/2013 10:10:00 AM

## 2013-10-08 NOTE — Rehab Progress Note ([facility]) (Signed)
[name]  MRN: [id number]  Account: [id number]  Session Start: 10/07/2013 10:00:00 AM  Session Stop: 10/07/2013 11:00:00 AM    Physical Therapy  Inpatient Rehabilitation Progress Note    Rehab Diagnosis: Gait Disturbance  Demographics:            Age: 67Y            Gender: Female  Rehabilitation Precautions/Restrictions:   Fall, skin, R knee in full extension,  10% WB RLE, HTN    SUBJECTIVE  Patient/Caregiver Goals:  "I want to learn how to use my leg again so I can go  home."    Pain: Patient currently has pain.  Patient reports a pain level of 2 out of 10. Patient stated pain was tolerable  at this time.    OBJECTIVE  General Observations: Pt received sitting in w/c with daughter present in room;  Pt agreeable to PT treatment at this time; Daughter present for entire session  with patient's permission  Vital Signs:                       Current Value                Previous Value  Vitals  Time                 1000 on 10/07/13             -  Position/Activity    Seated in WC                 -  BP Systolic          133                          112/57  BP Diastolic         61                           -  Pulse                65                           66  Respirations         -                            18  O2 Saturation        98% on room air              -    Functional Measures      TRANSFERS BED/CHAIR/WHEELCHAIR: Bed/chair/wheelchair Transfer Score = 5.  Patient is supervision/set-up for transferring to and from the  bed/chair/wheelchair, requiring: Stand by assistance.  Verbal cuing, prompting, or instructing.  Set up (positioning equipment, lock brakes and/or adjust foot rest).  Patient requires the following assistive device(s):  Rolling Walker Please refer  to intervention section for additional details.    LOCOMOTION WHEELCHAIR:   Wheelchair locomotion was observed using a manual wheelchair. Wheelchair  Distance Scale = 1.  Distance traveled in wheelchair is less than 50 feet.  Wheelchair Score =  1.  Patient requires supervision or set up only with  propelling wheelchair. Patient was able to propel a distance of  30 feet in a  wheelchair.  No  assistive devices required. Please refer to intervention section  for additional details.    LOCOMOTION WALK:   Walk Distance Scale = 1.  Distance walked is less than 50 feet. Walk Score = 1.   Patient requires supervision or set up only. Patient walked a distance of 18  feet. Rolling walker. Pt requires close SPV.    LOCOMOTION STAIRS: Stairs did not occur because activity was unsafe for patient.      Interventions:       Therapeutic Activities:  Today's session focused on increasing endurance,  functional mobility independence, and discharge planning.  Treatment included:    - Transfer training: Patient performed bed <-> WC transfer via stand step with  RW requiring SPV assistance.  Pt was taught how to lock WC breaks and move leg  rests out of the way to increase safety with transfer.    - Gait Training: Patient ambulated 18 ft x1 with RW utilizing NWB status on RLE.   PT then donned biofeedback boot (set at 10% of pt's weight) and taught patient  how to ambulate with 10% WB status.  Patient trailed for about 10 feet x1 and  was not able to adhere to WB status this method.    - Pre-Stair Training: PT and patient discussed many different ways to negotiate  steps.  Pt attempted in place hopping in the parallel bars with a 4in step in  front of her with the goal to lift R leg high enough to clear step.  Pt was  unable to bring LLE high enough to clear 4in step.    - WC Mobility: Pt propelled self about 30 feet with BUE in manual WC requiring  SPV assistance.  Increased distance was limited due to fatigue.    - Discharge planning: PT, patient, and patient's daughter discussed discharge  planning extensively.  Topic included home barriers, set up options, placement  options, safety concerns, etc.    *patient was handed off to TR therapist.    Equipment Provided: None at  this time.    Education Provided:    Education Provided: Precautions. Plan of care. Functional transfers. Safety.  Equipment. Gait. Stair/curb/environmental barrier negotiation.       Audience: Patient and Caregiver       Mode: Explanation.  Demonstration.       Response: Applied knowledge.  Verbalized understanding.  Demonstrated skill.  Needs practice.  Needs reinforcement.    ASSESSMENT  Patient tolerated treatment well without any adverse reactions.  Patient is very  motivated to improve and daughter appears very supportive.  Patient would  benefit from additional physical therapy treatment to continue to improve  transfers, endurance, gait tolerance, stair negotiation, and discharge planning.      Long Term Goals: Pt will be indep with all bed mobility to promote indep morning  routine  Pt will indep with LRAD with all transfers for safe negotiation of her home  Pt will walk at least 51ft with LRAD and indep to access all areas of her home  Pt will perform car transfers with LRAD and indep in order to attend MD appts as  needed  Pt will negotiate at least 4 steps with LRAD and mod-max A for safety/steadying  in order to enter and exit her home safely  10-14 days from eval on 10/04/13  Short Term Goals:    Progress Towards Goals: Patient has not met functional goals to date due to  today only being patient's third day of  treatment.  Patient demonstrates ability  to meet long term goals with additional physical therapy treatment.    PLAN  Continued Physical Therapy is recommended.  Recommended Frequency/Duration/Intensity: 1-2 hrs/day, 5-6 days/wk for 2 weeks  from evaluation on 10/04/13  Activities Contributing Toward Care Plan: Bed mobility training, transfer  training, balance training, gait training, stair training, therapeutic  strengthening exercises, therapeutic activities, ROM exercises, endurance  activities, family/caregiver training, instruction in home exercise program,  education for home  modifications, procurement of DME.    Care Plan  Identified problems from team documentation:  Problem: Impaired Mobility  Mobility: Primary Team Goal: Pt will be indep with all bed mobility and  transfers with LRAD, indep with walking at least 70ft with LRAD, mod-max A for 4  steps with LRAD and HR/Active    Problem: Impaired Pain Management  Pain Mgmt: Primary Team Goal: Team will collaborate and work efficiently and  effectively in achieving manageable pain levels while participating in acute  rehab./    Problem: Impaired Self-care Mgmt/ADL/IADL  Self Care: Primary Team Goal: Patient will perform toileting transfer and task  with MOD I.  Caregiver will demo safe supervision for bathing and LB dressing  according to family training./Active    Add/Update Problems from this Treatment:  Update of existing problem:   Mobility: Pt requires SPV with bed <-> WC transfers, close SPV for ambulation  (18 ft), SPV for WC mobility (about 30 feet), and unable to safely negotiate  stairs at this time.    Discipline:  Physical Therapy    Please review Integrated Patient View Care Plan Flowsheet for Team identified  Problems, Interventions, and Goals.    3 Hour Rule Minutes: 60 minutes of PT treatment this session count towards  intensity and duration of therapy requirement. Patient was seen for the full  scheduled time of PT treatment this session.    Signed by: Veneta Penton, PT 10/07/2013 11:00:00 AM

## 2013-10-08 NOTE — Rehab Progress Note ([facility]) (Signed)
[name]  MRN: [id number]  Account: [id number]  Session Start: 10/08/2013 11:00:00 AM  Session Stop: 10/08/2013 12:00:00 PM    Physical Therapy  Inpatient Rehabilitation Progress Note - Brief    Rehab Diagnosis: Gait Disturbance  Demographics:            Age: 74Y            Gender: Female  Rehabilitation Precautions/Restrictions:   Fall, skin, 10% WB RLE- no knee flexion    SUBJECTIVE  Patient Report: c/o increased soreness at B shoulders  Pain: Patient unable to communicate level of pain. Clinical indicators of pain  include: Grimacing. Frowning. See below    OBJECTIVE  General Observations: Pt received sitting in w/c agreeable to PT treatment at  this time; B shoulder analgesic patches donned    Interventions:       Therapeutic Exercise:  Rx session focusing on AROM at B shoulders to  increase ROM and decrease pain. Pt instructed in shoulder clocks, AA elevation  in scapular plane, and shoulder flexion to 90 degrees. Squat pivot transfer onto  mat w/ CGA for safety. Pt able to transition from sit to supine with supervision  only, while managing RLE. Pt instructed in LLE ther in supine; activities to  include: heel slides, SLRs, hip abduction, and SAQ for 2 sets of 10 reps. Supine  to sit from wedge with increased and SBA. Pt instructed in self propelling w/c approx  75' w/ LLE only to promote strength. Pt returned to room, all needs met.  Pain Reassessment: Pain was not reassessed as no pain was reported.    ASSESSMENT  Pt making positive gains in safety and (I) w/ transfers while safely adhering to  wb'ing restrictions on RLE. Will continue to promote safety and (I) w/  functional mobility at a wheelchair level in prep for safe transition to the  next level of care.    PLAN  Continued Physical Therapy is recommended.  Recommended Frequency/Duration/Intensity: 1-2 hrs/day, 5-6 days/wk for 2 weeks  from evaluation on 10/04/13  Activities Contributing Toward Care Plan: Bed mobility training,  transfer  training, balance training, gait training, stair training, therapeutic  strengthening exercises, therapeutic activities, ROM exercises, endurance  activities, family/caregiver training, instruction in home exercise program,  education for home modifications, procurement of DME.    3 Hour Rule Minutes: 60 minutes of PT treatment this session count towards  intensity and duration of therapy requirement. Patient was seen for the full  scheduled time of PT treatment this session.    Signed by: Honor Junes, DPT, PT 10/08/2013 12:00:00 PM

## 2013-10-08 NOTE — Discharge Instructions (Signed)
Discharge to acute rehab [facility]  Follow up with Dr. Ahmed Prima in 4 weeks.  Follow up with Dr. Carolann Littler per instruction.    Patient and family agree with discharge plan.    Charlestine Night, RN 10/03/2013 7:32 PM  [phone]     [facility] K[facility] Replacement Discharge Instructions    SWELLING  It is normal to have some swelling in your lower legs after surgery.  Walking every hour and doing your exercises will help strengthen your muscles and resolve the swelling.  If you have swelling, we recommend that you lie down every two hours, elevate your legs with pillows, and apply ice for 15 minutes.  If the swelling does not go away overnight, please call your doctors office.    SHOWERS  You are permitted to shower at home as long as your incision is clean and intact.  If you have a clear plastic dressing, leave that in place for 10 days and just let the water run over the dressing. If you have a cloth or gauze dressing, remove it before the shower and replace it after the shower.  Do not soak in a tub or swim until you doctor says it's okay.    ACTIVITY  You will be using an assistive device ( walker, crutches or cane). Your physical therapist will help you with this. Most patients are able to get in and out of bed, use the restroom and go up and down stairs when they go home. It's a good idea to have someone with you in case you need help for the first week. We recommend that you get up and move around every 1-1.5 hours while you are awake. This will help with stiffness and reduce the chance of blood clots.    PAIN  You may continue to have pain or soreness for several weeks after surgery.  Gradually taper the frequency and amount of narcotic pain medication you are taking based on the amount of pain or soreness you have.  Extra strength acetaminophen can be used but should not exceed 4000mg  in one day.  Apply ice to the incision for 15 minutes after activity to help ease soreness.    MEDICATIONS  Most patients  are discharged with several prescriptions. There are 2 main categories, pain pills and pills to help prevent blood clots.  Pain pills - take these if you need them. When taking narcotic pain medications, be sure to drink plenty of water and use stool softeners, such as Pericolace or Colace, as these pain medications can be constipating.  Blood Thinners - could be aspirin, coumadin, lovenox or mechanical pumps. Make sure you take the medicine properly or use the pumps as prescribed.  Vitron C - this is an over the counter medication that will help to build back your blood count after surgery. We recommend this for most patients.    CALLING THE OFFICE  Call the doctor if you have any additional questions or concerns. After hours one of the fellows that helped with the surgery will return your call. During working hours the staff can answer simple questions. If you have concerns not addressed to your satisfaction by the staff then ask for your doctor's nurse to return your call.       After Knee Replacement: Back at Home  You and your healthcare team will assess how well you can care for yourself at home. You may need friends, family, or a home health aide to  help with chores and errands. Your occupational therapist will teach you the skills needed for daily living with your new knee.  Home Safe Home  Is your home as safe as it should be? Or, are there potential hazards, like rugs and cords, ready to trip you up? Make sure your home is safe and free of hazards before you return. Ask friends or family to help you rearrange rooms as needed. Tips:   Remove throw rugs to prevent slipping or tripping on them.   Move electrical cords out of the way.   Install a rail along one side of staircases.    Bathroom Safety  You may need to adjust your bathroom to make it safer and easier to use. Your occupational therapist can help you choose the right equipment for your bathroom. He or she will also teach you to bathe, dress, and  sit more easily in the bathroom. Tips:   Use a long-handled sponge to wash hard-to-reach areas.   Use a rubber-backed bath mat to help keep the floor dry and prevent slipping.   Sit on a shower chair while you bathe.   Use a commode chair or elevated toilet seat to raise the height of your toilet.  Managing Pain at Home  You may be prescribed pain medication to use at home. With pain under control, you'll get back to an active life sooner. Use pain medication only as directed. Take each dose on schedule, before pain becomes severe. (Don't hesitate to take medication when you need it.) Wait about 30 minutes after taking pain medication before starting an activity, such as exercise. Tell your doctor if the medication doesn't control your pain or if you suddenly feel worse. Icing and elevating your leg can also help relieve pain.   [address] [place], [place] [address]. All rights reserved. This information is not intended as a substitute for professional medical care. Always follow your healthcare professional's instructions.

## 2013-10-08 NOTE — Rehab Progress Note ([facility]) (Signed)
NAMECHELCY BOLDA  MRN: [id number]  Account: [id number]  Session Start: 10/08/2013 12:00:00 AM  Session Stop: 10/08/2013 12:00:00 AM    SEVERE SEPSIS SCREEN  INFECTION:  Patient has documented infection. Is on antibiotic therapy (not  prophylaxis).    SYSTEMATIC IMFLAMMATORY RESPONSE SYNDROME (SIRS):  Patient  has no indication of  systematic inflammatory response syndrome (SIRS). Negative Sepsis Screen.  If you are unable to assess a system's dysfunction because you do not have labs,  or the labs you have are not current (within 24 hours), call physician and  request and order for the lab tests needed.    Signed by: Stephanie Acre, RN 10/08/2013 8:00:00 AM

## 2013-10-08 NOTE — Rehab Progress Note ([facility]) (Signed)
NAMEGEORGEAN SPAINHOWER  MRN: [id number]  Account: [id number]  Session Start: 10/08/2013 3:00:00 PM  Session Stop: 10/08/2013 4:00:00 PM    Therapeutic Recreation  Inpatient Rehabilitation Progress Note    Rehab Diagnosis: Gait Disturbance  Demographics:            Age: 4Y            Gender: Female  Rehabilitation Precautions/Restrictions:   Fall, skin, 10% WB RLE    SUBJECTIVE  Patient Reports: "Thank you and happy new year."  Patient/Caregiver Goals:  I want to get better so that I can go home.  Pain: Patient currently without complaints of pain.    OBJECTIVE    Interventions: The following treatment was provided:  Socialization/Psychosocial: Pt. attended peer mentor support group.  Pain Reassessment: Pain was not reassessed as no pain was reported.    Education Provided:  No education provided this session.    ASSESSMENT  Pt. attended peer mentor support group in dayroom. Pt. listened to peers share  stories of hospitalization and personal recovery. Enjoyed group setting.  Progress Towards Goals:    PLAN  Therapeutic Recreation services are recommended to address: leisure maintenance,  education, social opportunities    Care Plan  Identified problems from team documentation:  Problem: Impaired Mobility  Mobility: Primary Team Goal: Pt will be indep with all bed mobility and  transfers with LRAD, indep with walking at least 48ft iwth LRAD, mod-max A for 4  steps with LRAD and HR/Active    Problem: Impaired Pain Management  Pain Mgmt: Primary Team Goal: Team will collaborate and work efficiently and  effectively in achieving manageable pain levels while participating in acute  rehab./    Problem: Impaired Self-care Mgmt/ADL/IADL  Self Care: Primary Team Goal: Patient will perform toileting transfer and task  with MOD I.  Caregiver will demo safe supervision for bathing and LB dressing  according to family training./Active    Add/Update Problems from this Treatment:  No updates at this time.    Please review Integrated  Patient View Care Plan Flowsheet for Team identified  Problems, Interventions, and Goals.    Signed by: Pearson Forster, CTRS 10/08/2013 4:00:00 PM

## 2013-10-08 NOTE — Rehab Progress Note ([facility]) (Signed)
NAMEZELPHA MESSING  MRN: [id number]  Account: [id number]  Session Start: 10/08/2013 2:00:00 PM  Session Stop: 10/08/2013 3:00:00 PM    Occupational Therapy  Inpatient Rehabilitation Progress Note - Brief    Rehab Diagnosis: Field  Demographics:            Age: Field            Gender: Field  Rehabilitation Precautions/Restrictions:   Fall, skin, 10% WB RLE    SUBJECTIVE  Patient Report: "I am doing ok>"  Pain: Patient currently has pain.  Patient reports a pain level of 3 out of 10. manual therapy    OBJECTIVE  General Observation: Branch    Interventions:       Manual Therapy:  ROM and myofascial release/soft tissue massage of RUE  external rotator muscles and subscapularis/pectoralis minor muscles particularly  observed to be tight/inflammed.  Pt performed deep breathing as therapist  applied pressure to attempt to release tension, and scapular mobility to attempt  to relieve any impingement.       Therapeutic Activities:  Pt performed functional standing at raised table  top with towel glides to facilitate increased standing tolerance/balance with  adherence to WB precautions.  Pt stood for total of approx5 minutes x two trials.       Therapeutic Exercise:  Bicep and tricep curls performed with 2lb free  weights 3 sets of 15 reps to increase UB strength needed for ADL tasks and  transfers.  No other BUE ther-ex performed d/t pain in R shoulder.  Pain Reassessment: Branch    Education Provided:  Branch    ASSESSMENT  Pt is increasing overall strength/endurance and is able to perform more  challenging dynamic standing balance tasks with CGA.  Pain and generalized  weakness as well as WB status remain primary barriers.    PLAN  Continued Occupational Therapy is recommended.  Recommended Frequency/Duration/Intensity: 1-2 hours/day, 5-7days/week, for 14  days s/p eval on 10/05/13.  Continued Activities Contributing Toward Care Plan: Therapeutic exercise,  therapeutic activity, ADL/transfer training, balance training,  endurance  training, pt/family education    3 Hour Rule Minutes: 60 minutes of OT treatment this session count towards  intensity and duration of therapy requirement. Patient was seen for the full  scheduled time of OT treatment this session.    Signed by: Tyrone Sage, OTR/L 10/08/2013 3:00:00 PM

## 2013-10-08 NOTE — Rehab Progress Note ([facility]) (Signed)
[name]  MRN: [id number]  Account: [id number]  Session Start: 10/08/2013 12:00:00 AM  Session Stop: 10/08/2013 12:00:00 AM    Rehabilitation Nursing  Inpatient Rehabilitation Shift Assessment    Rehab Diagnosis: Gait Disturbance  Demographics:            Age: 77Y            Gender: Female  Primary Language: english    Date of Onset:  09-05-13  Date of Admission: 10/03/2013 6:43:00 PM    Rehabilitation Precautions Restrictions:   Fall, skin, 10% WB RLE    Patient Report: "Good morning"  Pain: Patient currently without complaints of pain.  Pain Reassessment: Pain was not reassessed as no pain was reported.  Patient/Caregiver Goals:  I want to get better so that I can go home.    NEURO  Orientation/Awareness: Alert and Oriented x4.  Speech: Clear.  Behavior: Cooperative.    MEDICATIONS  IV Access: No IV access.  Dialysis Access: Patient does not have dialysis access.      Elopement Risk Level Assessment Tool  Patient Criteria: Patient is not capable of leaving the unit.  Assessment is not  applicable.      RISK ASSESSMENT FOR FALLS/INJURY    MENTAL STATUS CRITERIA:   0 - None identified.  MENTAL STATUS TOTAL: 0    AGE CRITERIA:   16 - 21-46 years old  AGE TOTAL: 1    ELIMINATION CRITERIA:   3 - Toileting with Assistance.   ELIMINATION TOTAL: 3    HISTORY OF FALLS CRITERIA:   2 - Unknown History.  HISTORY OF FALLS TOTAL: 2    MEDICATIONS CRITERIA:   2 or more High Risk Medications (*see list below)   MEDICATIONS TOTAL: 2    PHYSICAL MOBILITY CRITERIA:   3 - Decreased balance reaction.   1 - Weakness/impaired physical mobility.   PHYSICAL MOBILITY TOTAL: 4    FALLS RISK ASSESSMENT TOTAL: 12    Patient's Fall Risk: TOTAL SCORE >10: High Risk    Falls Interventions: Clutter removed and clear path to BR.  Call bell, phone, glasses, etc within reach.  Hourly toileting/safety checks between 6am and 10pm, then every 2 hours.  Yellow "high risk" patient identification in place: wrist band, socks, chart  sticker, door  sign.  Pt and family education.  Assistive devices at [facility]  Bed alarm    NUTRITION  Diet:  Type: Regular.  Food Consistency: Regular.  Liquid Consistency: Thin.      CARDIOVASCULAR     Right Lower Extremity  Nail Bed Color: Pink.   Edema noted around the right knee surgical site, Right leg elevated on a pillow  while in bed  Pulses:   Apical Pulse: Regular. Strong. Rate is 66 .   Patient does not have a pacemaker.   Patient does not have a defibrillator.    CARDIOPULMONARY  Lung Sounds:   Upper lobes. Clear.   Lower lobes. Clear.  Type of Respirations:  Cough: No cough noted.  Respiratory Support: The patient does not require any respiratory support.  Respiratory Equipment: None.    INTEGUMENTARY  Skin:  Temperature: Warm  Turgor: Normal for age  Moisture: Dry  Color of skin: Normal for Race/Ethnicity  Capillary Refill: Less than 3 seconds  Wounds/Incisions:     Surgical Incision: Right knee incision. Length: 20 centimeter(s) with staples  and sutures. No signs of infection.  Drainage: Minimal amount of clear drainage.  Odor:  No  Incision Care:  Per protocol.     Surgical Incision: Right knee incision. Length: 18 centimeter(s) with staples.  No signs of infection.  Drainage: Incision without drainage.  Odor:  No  Incision Care: Open to air.  Braden Scale for Predicting Pressure Sore Risk: Sensory Perception: No  impairment  Moisture: Rarely moist  Activity: Chairfast  Mobility: Slightly limited  Nutrition: Adequate  Friction and Shear: No apparent problem  Braden Score: 19  Level of Risk: No risk (19-23). Will reassess every shift.    GASTROINTESTINAL  Abdomen: Soft. Nontender.  Bowel Sounds:  Active bowel sounds audible in all four quadrants.  Date of Last Bowel Movement:  12/29   No Problems/Complaints with Bowel Elimination Assessed.    GENITOURINARY  Current Bladder Pattern: Continent  Color:  Yellow   Patient denies problems with urination and/or catheter.    MUSCULOSKELETAL  Upper Body:  Lower Body: RLE- RIGHT  LOWER EXTREMITY 10% WB in extension. NO bending of R knee    Functional Measures    EATING: Eating Score = 7. Patient is completely independent for eating.  There  are no activity limitations.    BLADDER MANAGEMENT - LEVEL OF ASSIST: Bladder Score = 7. Patient is completely  independent for bladder management. There are no activity limitations.    BLADDER ACCIDENTS THIS SHIFT:  0 . Patient has not had an accident this  assessment and did not require medications or devices.    BOWEL MANAGEMENT - LEVEL OF ASSIST: Bowel Score = 6.  Patient is modified  independent for bowel management.  Patient did not have bowel movement.  Medication/intervention was provided.    BOWEL ACCIDENTS THIS SHIFT: 0 . Patient has not had an accident, but used a  stool softener.    Education Provided:    Education Provided: Safety issues and interventions. Fall protocol. Skin/wound  care. Signs/symptoms of infection. Incision care.       Audience: Patient.       Mode: Explanation.  Demonstration.       Response: Verbalized understanding.    Discharge: Patient is not being discharged at this time.    Long Term Goals: Within 2 weeks, patient will be able verbalize 2/10 on pain  scale on a consistent basis for effective pain management. - Goal Not Met  2 weeks from 10/03/2013  Short Term Goals: Within 2 weeks, patient will be able to state medications  being taken along with indications, and possible side effects. - Goal Not Met  2 weeks from 10/03/2013    PROGRESS TOWARD GOALS: SHORT TERM GOAL REVIEW:       2. Within 2 weeks, patient will be able to state medications being taken  along with indications, and possible side effects. - Not Met: pt requires  reminder  of the medication need to be taken.  Time frame to achieve short term goal(s):  2 weeks from 10/03/2013    PLAN: Nursing Specific Interventions  Medication Management. Pain Management. Skin Management. Wound Management.  Continue with the current Nursing Plan of Care.    TEAM CARE  PLAN  Identified problems from team documentation:  Problem: Impaired Mobility  Mobility: Primary Team Goal: Pt will be indep with all bed mobility and  transfers with LRAD, indep with walking at least 72ft iwth LRAD, mod-max A for 4  steps with LRAD and HR/Active    Problem: Impaired Pain Management  Pain Mgmt: Primary Team Goal: Team will collaborate and work efficiently and  effectively in achieving manageable pain  levels while participating in acute  rehab./    Problem: Impaired Self-care Mgmt/ADL/IADL  Self Care: Primary Team Goal: Patient will perform toileting transfer and task  with MOD I.  Caregiver will demo safe supervision for bathing and LB dressing  according to family training./Active    Add/Update Problems from this assessment:  No updates at this time.    Please review Integrated Patient View Care Plan Flowsheet for Team identified  Problems, Interventions, and Goals.    Signed by: Stephanie Acre, RN 10/08/2013 1:00:00 PM

## 2013-10-09 NOTE — Rehab Progress Note ([facility]) (Signed)
NAMECRISTELLA STIVER  MRN: [id number]  Account: [id number]  Session Start: 10/09/2013 12:00:00 AM  Session Stop: 10/09/2013 12:00:00 AM    SEVERE SEPSIS SCREEN  INFECTION:  Patient has documented infection. Is on antibiotic therapy (not  prophylaxis).    SYSTEMATIC IMFLAMMATORY RESPONSE SYNDROME (SIRS):  Patient  has no indication of  systematic inflammatory response syndrome (SIRS). Negative Sepsis Screen.  If you are unable to assess a system's dysfunction because you do not have labs,  or the labs you have are not current (within 24 hours), call physician and  request and order for the lab tests needed.    Signed by: Stephanie Acre, RN 10/09/2013 8:00:00 AM

## 2013-10-09 NOTE — Rehab Progress Note ([facility]) (Signed)
[name]  MRN: [id number]  Account: [id number]  Session Start: 10/09/2013 12:00:00 AM  Session Stop: 10/09/2013 12:00:00 AM    Rehabilitation Nursing  Inpatient Rehabilitation Shift Assessment    Rehab Diagnosis: Gait Disturbance  Demographics:            Age: 78Y            Gender: Female  Primary Language: english    Date of Onset:  09-05-13  Date of Admission: 10/03/2013 6:43:00 PM    Rehabilitation Precautions Restrictions:   Fall, skin, 10% WB RLE    Patient Report: NO pain  Pain: Patient currently without complaints of pain.  Pain Reassessment: Pain was not reassessed as no pain was reported.  Patient/Caregiver Goals:  I want to get better so that I can go home.    NEURO  Orientation/Awareness: Alert and Oriented x3.  Speech: No deficits noted at this time.  Expresses Needs Functionally.  Behavior: Cooperative.    MEDICATIONS  IV Access: No IV access.  Dialysis Access: Patient does not have dialysis access.      Elopement Risk Level Assessment Tool  Patient Criteria: Patient is not capable of leaving the unit.  Assessment is not  applicable.      RISK ASSESSMENT FOR FALLS/INJURY    MENTAL STATUS CRITERIA:   0 - None identified.  MENTAL STATUS TOTAL: 0    AGE CRITERIA:   78 - 73-72 years old years old  AGE TOTAL: 1    ELIMINATION CRITERIA:   3 - Toileting with Assistance.   ELIMINATION TOTAL: 3    HISTORY OF FALLS CRITERIA:   2 - Unknown History.  HISTORY OF FALLS TOTAL: 2    MEDICATIONS CRITERIA:   2 or more High Risk Medications (*see list below)   MEDICATIONS TOTAL: 2    PHYSICAL MOBILITY CRITERIA:   3 - Decreased balance reaction.   1 - Weakness/impaired physical mobility.   1 - Weakness/impaired physical mobility.   3 - Decreased balance reaction.   PHYSICAL MOBILITY TOTAL: 4    FALLS RISK ASSESSMENT TOTAL: 12    Patient's Fall Risk: TOTAL SCORE >10: High Risk    Falls Interventions: Clutter removed and clear path to BR.  Call bell, phone, glasses, etc within reach.  Hourly toileting/safety checks between 6am and  10pm, then every 2 hours.  Initiate Fall care plan and outcome.  Yellow "high risk" patient identification in place: wrist band, socks, chart  sticker, door sign.  Pt and family education.  Assistive devices at [facility].  Pharmacy review of meds.    NUTRITION  Diet:  Type: Regular.  Food Consistency: Regular.  Liquid Consistency: Thin.      CARDIOVASCULAR     Bilateral lower extremities  Nail Bed Color: Pink.   R LE swelling r/t surgery  Pulses:   Apical Pulse: Regular. Strong. Rate is .   Patient does not have a pacemaker.   Patient does not have a defibrillator.    CARDIOPULMONARY  Lung Sounds:   Upper lobes. Clear.  Type of Respirations: Regular. Regular.  Cough: No cough noted.  Respiratory Support: The patient does not require any respiratory support.  Respiratory Equipment: None.    INTEGUMENTARY  Skin:  Temperature: Warm  Turgor: Normal for age  Moisture: Dry  Color of skin: Normal for Race/Ethnicity  Capillary Refill: Less than 3 seconds  Wounds/Incisions:     Wound/incision not assessed this shift.   R LE surgical sites- dressings dry and intact  Braden Scale for Predicting Pressure Sore Risk: Sensory Perception: No  impairment  Moisture: Occasionally moist  Activity: Chairfast  Mobility: Slightly limited  Nutrition: Adequate  Friction and Shear: No apparent problem  Braden Score: 18  Level of Risk: At risk (15-18)   Pressure Relief every 30 minutes while in wheelchair   Turn patient every 2 hours   Assist patient to the bathroom every 2 hours    GASTROINTESTINAL  Abdomen: Soft. Obese. Nontender.  Bowel Sounds:  Active bowel sounds audible in all four quadrants.  Date of Last Bowel Movement:  10/08/2013   No Problems/Complaints with Bowel Elimination Assessed.    GENITOURINARY  Current Bladder Pattern: Continent  Color:  Yellow   Patient denies problems with urination and/or catheter.    MUSCULOSKELETAL  Upper Body: none  Lower Body: RLE- RIGHT LOWER EXTREMITY 10% WB in extension. NO bending of R  knee    Functional Measures      TOILETING: Toileting Score = 6.  Patient is modified independent for toileting,  requiring: Safety considerations.  More than reasonable time.  Adaptive device to maintain balance.  Grab bar.    BLADDER MANAGEMENT - LEVEL OF ASSIST: Bladder Score = 7. Patient is completely  independent for bladder management. There are no activity limitations.    BLADDER ACCIDENTS THIS SHIFT:  0 . Patient has not had an accident this  assessment and did not require medications or devices.    BOWEL MANAGEMENT - LEVEL OF ASSIST: Bowel Score = 6. Patient is modified  independent for bowel management requiring: Stool softeners    BOWEL ACCIDENTS THIS SHIFT: 0 . Patient has not had an accident, but used a  stool softener.    TRANSFERS BED/CHAIR/WHEELCHAIR: Bed/chair/wheelchair Transfer Score = 4.  Patient performs 75% or more of effort and minimal assistance (little/incidental  help/lifting of one limb/steadying) for transferring to and from the  bed/chair/wheelchair, requiring: Contact guard. Steadying. Hand placement. Help  to/from supine only.  Patient requires the following assistive device(s): Walker.  Bed rails.  Elevated head of bed.  Elevated bed/surface.    TRANSFER TOILET: Toilet Transfer Score = 5.  Patient is supervision/set-up for  transferring to and from the toilet/commode, requiring: Stand by assistance.  Verbal cuing, prompting, or instructing.  Application of assistive/adaptive devices.  Wheelchair management (brakes, footrests, armrests).  Set up (positioning equipment, lock brakes and/or adjust foot rests).  Patient requires the following assistive device(s):  Walker.    Education Provided:    Education Provided: Precautions. Pain management. Pain scale. Medication  options. Side effects. Plan of care. Safety issues and interventions. Fall  protocol. Use of adaptive devices. Supervision requirements. Skin/wound care.  Signs/symptoms of infection. Incision care. Critical pressure areas.  Prevention  of skin breakdown. Breathing exercises. Incentive spirometry.       Audience: Patient and significant other.       Mode: Explanation.  Demonstration.       Response: Applied knowledge.  Verbalized understanding.  Needs reinforcement.    Discharge: Patient is not being discharged at this time.    Long Term Goals: Within 2 weeks, patient will be able verbalize 2/10 on pain  scale on a consistent basis for effective pain management. - Goal Not Met  2 weeks from 10/03/2013  Short Term Goals: Within 2 weeks, patient will be able to state medications  being taken along with indications, and possible side effects. - Goal Not Met  2 weeks from 10/03/2013    PROGRESS TOWARD GOALS:  ongoing health teachings    PLAN: Nursing Specific Interventions  Medication Management. Pain Management. Skin Management. Wound Management.  Continue with the current Nursing Plan of Care.    TEAM CARE PLAN  Identified problems from team documentation:  Problem: Impaired Mobility  Mobility: Primary Team Goal: Pt will be indep with all bed mobility and  transfers with LRAD, indep with walking at least 60ft iwth LRAD, mod-max A for 4  steps with LRAD and HR/Active    Problem: Impaired Pain Management  Pain Mgmt: Primary Team Goal: Team will collaborate and work efficiently and  effectively in achieving manageable pain levels while participating in acute  rehab./    Problem: Impaired Self-care Mgmt/ADL/IADL  Self Care: Primary Team Goal: Patient will perform toileting transfer and task  with MOD I.  Caregiver will demo safe supervision for bathing and LB dressing  according to family training./Active    Add/Update Problems from this assessment:  No updates at this time.    Please review Integrated Patient View Care Plan Flowsheet for Team identified  Problems, Interventions, and Goals.    Signed by: Gordy Clement, RN3 10/09/2013 5:51:00 AM

## 2013-10-09 NOTE — Rehab Progress Note ([facility]) (Signed)
[name]  MRN: [id number]  Account: [id number]  Session Start: 10/09/2013 12:00:00 AM  Session Stop: 10/09/2013 12:00:00 AM    SEVERE SEPSIS SCREEN  INFECTION:  Patient has documented infection. Is on antibiotic therapy (not  prophylaxis).    SYSTEMATIC IMFLAMMATORY RESPONSE SYNDROME (SIRS):  Patient  has no indication of  systematic inflammatory response syndrome (SIRS). Negative Sepsis Screen.  If you are unable to assess a system's dysfunction because you do not have labs,  or the labs you have are not current (within 24 hours), call physician and  request and order for the lab tests needed.    Signed by: Tory Emerald, RN 10/09/2013 8:10:00 PM

## 2013-10-09 NOTE — Rehab Progress Note ([facility]) (Signed)
NAMECRYSTINA [name]  MRN: [id number]  Account: [id number]  Session Start: 10/09/2013 3:00:00 PM  Session Stop: 10/09/2013 4:00:00 PM    Occupational Therapy  Inpatient Rehabilitation Progress Note - Brief    Rehab Diagnosis: Gait Disturbance  Demographics:            Age: 66Y            Gender: Female  Rehabilitation Precautions/Restrictions:   Fall, skin, 10% WB RLE    SUBJECTIVE  Pain: Patient currently has pain.  Patient reports a pain level of 3 out of 10. Applied heat.    OBJECTIVE    Interventions:       Manual Therapy:  OT treatment focused on pain management techniques to  improve standing tolerance and use of Right UE in prep for ADL tasks. Pt  tolerated heat to Right shoulder joint x 6 minutes in prep for stretching and  exercises. Patient tolerated gentle massage over acromion muscle attachments  with pain relief noted. Patient engaged in closed chain exercises through full  Right shoulder flexion/extension with no pain noted. Pt completed stand pivot  transfers using RW with CGA.      ASSESSMENT  Patient demonstrated pain relief in Right shoulder following heat, stretching,  and muscle massage. Educated patient on rest shoulder between therapy sessions  due to overuse symptoms. Pt limited by pain and decreased strength during  transfers.    PLAN  Continued Occupational Therapy is recommended.  Recommended Frequency/Duration/Intensity: 1-2 hours/day, 5-7days/week, for 14  days s/p eval on 10/05/13.  Continued Activities Contributing Toward Care Plan: Therapeutic exercise,  therapeutic activity, ADL/transfer training, balance training, endurance  training, pt/family education    3 Hour Rule Minutes: 60 minutes of OT treatment this session count towards  intensity and duration of therapy requirement. Patient was seen for the full  scheduled time of OT treatment this session.    Signed by: Drinda Butts, OTR/L 10/09/2013 4:00:00 PM

## 2013-10-09 NOTE — Rehab Progress Note ([facility]) (Signed)
NAMEEDISON WOLLSCHLAGER  MRN: [id number]  Account: [id number]  Session Start: 10/09/2013 12:00:00 AM  Session Stop: 10/09/2013 12:00:00 AM    Rehabilitation Nursing  Inpatient Rehabilitation Shift Assessment    Rehab Diagnosis: Gait Disturbance  Demographics:            Age: 78Y            Gender: Female  Primary Language: english    Date of Onset:  09-05-13  Date of Admission: 10/03/2013 6:43:00 PM    Rehabilitation Precautions Restrictions:   Fall, skin, 10% WB RLE    Patient Report: "Happy New year"  Pain: Patient currently without complaints of pain.  Pain Reassessment: Pain was not reassessed as no pain was reported.  Patient/Caregiver Goals:  I want to get better so that I can go home.    NEURO  Orientation/Awareness: Alert and Oriented x3.  Speech: Clear.  Behavior: Cooperative.    MEDICATIONS  IV Access: No IV access.  Dialysis Access: Patient does not have dialysis access.      Elopement Risk Level Assessment Tool  Patient Criteria: Patient is not capable of leaving the unit.  Assessment is not  applicable.      RISK ASSESSMENT FOR FALLS/INJURY    MENTAL STATUS CRITERIA:   0 - None identified.  MENTAL STATUS TOTAL: 0    AGE CRITERIA:   78 - 76-95 years old  AGE TOTAL: 1    ELIMINATION CRITERIA:   3 - Toileting with Assistance.   ELIMINATION TOTAL: 3    HISTORY OF FALLS CRITERIA:   2 - Unknown History.  HISTORY OF FALLS TOTAL: 2    MEDICATIONS CRITERIA:   2 or more High Risk Medications (*see list below)   MEDICATIONS TOTAL: 2    PHYSICAL MOBILITY CRITERIA:   3 - Decreased balance reaction.   1 - Weakness/impaired physical mobility.   PHYSICAL MOBILITY TOTAL: 4    FALLS RISK ASSESSMENT TOTAL: 12    Patient's Fall Risk: TOTAL SCORE >10: High Risk    Falls Interventions: Clutter removed and clear path to BR.  Call bell, phone, glasses, etc within reach.  Hourly toileting/safety checks between 6am and 10pm, then every 2 hours.  Yellow "high risk" patient identification in place: wrist band, socks, chart  sticker, door  sign.  Assistive devices at [facility].  Bed alarm    NUTRITION  Diet:  Type: Regular.  Food Consistency: Regular.  Liquid Consistency: Thin.      CARDIOVASCULAR     Bilateral lower extremities  Nail Bed Color: Pink.   right leg swelling noted around the surgical incision site.  Pulses:   Apical Pulse: Regular. Strong. Rate is 73 .   Patient does not have a pacemaker.   Patient does not have a defibrillator.    CARDIOPULMONARY  Lung Sounds:   Upper lobes. Clear.  Type of Respirations: Regular.  Cough: No cough noted.  Respiratory Support: The patient does not require any respiratory support.  Respiratory Equipment: None.    INTEGUMENTARY  Skin:  Temperature: Warm  Turgor: Normal for age  Moisture: Dry  Color of skin: Normal for Race/Ethnicity  Capillary Refill: Less than 3 seconds  Wounds/Incisions:  Braden Scale for Predicting Pressure Sore Risk: Sensory Perception: No  impairment  Moisture: Rarely moist  Activity: Chairfast  Mobility: Slightly limited  Nutrition: Adequate  Friction and Shear: No apparent problem  Braden Score: 19  Level of Risk: No risk (19-23). Will reassess every shift.  GASTROINTESTINAL  Abdomen: Soft. Nontender.  Bowel Sounds:  Active bowel sounds audible in all four quadrants.  Date of Last Bowel Movement:  10/08/13   No Problems/Complaints with Bowel Elimination Assessed.    GENITOURINARY  Current Bladder Pattern: Continent  Color:  Yellow   Patient denies problems with urination and/or catheter.    MUSCULOSKELETAL  Upper Body: none  Lower Body: RLE- RIGHT LOWER EXTREMITY 10% WB in extension. NO bending of R knee    Functional Measures    EATING: Eating Score = 7. Patient is completely independent for eating.  There  are no activity limitations.    BLADDER MANAGEMENT - LEVEL OF ASSIST: Bladder Score = 7. Patient is completely  independent for bladder management. There are no activity limitations.    BLADDER ACCIDENTS THIS SHIFT:  0 . Patient has not had an accident this  assessment and did not  require medications or devices.    BOWEL MANAGEMENT - LEVEL OF ASSIST: Bowel Score = 6.  Patient is modified  independent for bowel management.  Patient did not have bowel movement.  Medication/intervention was provided.    BOWEL ACCIDENTS THIS SHIFT: 0 . Patient has not had an accident, but used a  stool softener.    Education Provided:    Education Provided: Safety issues and interventions. Fall protocol.       Audience: Patient.       Mode: Explanation.  Demonstration.       Response: Verbalized understanding.    Discharge: Patient is not being discharged at this time.    Long Term Goals: Within 2 weeks, patient will be able verbalize 2/10 on pain  scale on a consistent basis for effective pain management. - Goal Not Met  2 weeks from 10/03/2013  Short Term Goals: Within 2 weeks, patient will be able to state medications  being taken along with indications, and possible side effects. - Goal Not Met  2 weeks from 10/03/2013    PROGRESS TOWARD GOALS: Pt required supervision with all transfers.    PLAN: Nursing Specific Interventions  Medication Management. Pain Management. Skin Management. Wound Management.  Continue with the current Nursing Plan of Care.    TEAM CARE PLAN  Identified problems from team documentation:  Problem: Impaired Mobility  Mobility: Primary Team Goal: Pt will be indep with all bed mobility and  transfers with LRAD, indep with walking at least 38ft iwth LRAD, mod-max A for 4  steps with LRAD and HR/Active    Problem: Impaired Pain Management  Pain Mgmt: Primary Team Goal: Team will collaborate and work efficiently and  effectively in achieving manageable pain levels while participating in acute  rehab./    Problem: Impaired Self-care Mgmt/ADL/IADL  Self Care: Primary Team Goal: Patient will perform toileting transfer and task  with MOD I.  Caregiver will demo safe supervision for bathing and LB dressing  according to family training./Active    Add/Update Problems from this assessment:  No  updates at this time.    Please review Integrated Patient View Care Plan Flowsheet for Team identified  Problems, Interventions, and Goals.    Signed by: Stephanie Acre, RN 10/09/2013 1:00:00 PM

## 2013-10-09 NOTE — Rehab Progress Note ([facility]) (Signed)
[name]  MRN: [id number]  Account: [id number]  Session Start: 10/09/2013 4:00:00 PM  Session Stop: 10/09/2013 5:00:00 PM    Physical Therapy  Inpatient Rehabilitation Progress Note - Brief    Rehab Diagnosis: Gait Disturbance  Demographics:            Age: 83Y            Gender: Female  Rehabilitation Precautions/Restrictions:   Fall, skin, 10% WB RLE    SUBJECTIVE    Pain: Patient currently without complaints of pain.    OBJECTIVE    Interventions:       Therapeutic Activities:  Rx session focusing on promoting safety and (I)  with all functional mobility in prep for safe transition to next level of care.  Scoot pivot transfer w/ mod verbal cues to prevent wb'ing onto RLE. Supine to  sit w/ increased time and SBA. LLE ther ex to include: heel slides, hip  abduction, SAQs, LAQs, and glute sets for 2 sets of 10 reps. Supine to sit from  wedge w/ increased time and CGA. Pt instructed in anterior/posterior trunk leans  w/ 4# weight for 2 sets of 10 reps, to increase (I) w/ supine to sit transfers  from a flat surface. Pt instructed in self propelling w/c w/ LLE to increase  strength, approx 50'. Provided ice to R knee for pain management, hand off to nurse,  all needs met.  Pain Reassessment: Response to Pain Intervention: Increased R knee pain  Post Intervention Pain Quality:  Aching.  Patient Reports Post Intervention Pain Level of: 6 out of 10  Pain Acceptable: No: Nurse informed and to administer pain meds; ice provided.      ASSESSMENT  Pt presenting w/ decreased ability to safely adhere to RLE wb'ing restrictions,  in the presence  of increased fatigue, during scoot pivot transfers (attempting  squat pivots). Will continue to progress safety and (I) at a wheelchair level in  prep for safe transition to next level of care.    PLAN  Continued Physical Therapy is recommended.  Recommended Frequency/Duration/Intensity: 1-2 hrs/day, 5-6 days/wk for 2 weeks  from evaluation on 10/04/13  Activities Contributing  Toward Care Plan: Bed mobility training, transfer  training, balance training, gait training, stair training, therapeutic  strengthening exercises, therapeutic activities, ROM exercises, endurance  activities, family/caregiver training, instruction in home exercise program,  education for home modifications, procurement of DME.    3 Hour Rule Minutes: 60 minutes of PT treatment this session count towards  intensity and duration of therapy requirement. Patient was seen for the full  scheduled time of PT treatment this session.    Signed by: Honor Junes, DPT, PT 10/09/2013 5:00:00 PM

## 2013-10-09 NOTE — Progress Notes (Signed)
[facility] Physiatry Attending Brief Note  [x] Discussed with nurse.  [x] No new events    Subjective:  Feels well and is without complaints. Pain is controlled with current meds. No chest pain. No shortness of breath. No other complaints.    Objective:  Vitals: BP 138/59  Pulse 73  Temp 98.6 F (37 C)  Resp 17  SpO2 98%    Awake and Alert, No acute distress, Resting comfortably, No respiratory distress and No calf tenderness    New labs   Results     ** No Results found for the last 24 hours. **            Current Medications:  Current Facility-Administered Medications   Medication Dose Route Frequency   . amLODIPine  10 mg Oral Daily   . bisoprolol  5 mg Oral Daily   . ciprofloxacin  750 mg Oral Q12H SCH   . cloNIDine  0.1 mg Oral Daily   . docusate sodium  100 mg Oral Q12H SCH   . ferrous sulfate 324 mg-ascorbic acid 500 mg combo dose   Oral Daily   . gabapentin  300 mg Oral Q12H SCH   . hydrochlorothiazide  25 mg Oral Daily   . lidocaine  2 patch Transdermal Q24H   . polycarbophil  1,250 mg Oral Daily   . potassium chloride  10 mEq Oral Daily   . Senna  17.2 mg Oral QHS   . sulfamethoxazole-trimethoprim  1 tablet Oral Q12H SCH   . tiotropium  18 mcg Inhalation QAM   . traMADol  50 mg Oral Q6H SCH       Assessment/Plan: 78 y.o. female with complicated TKA and revisions    Patient Active Problem List   Diagnosis   . Total knee replacement status   . Low back pain   . Chronic obstructive pulmonary disease   . Hypertension   . Debility following multiple R knee revisions       Continue comprehensive intensive inpatient rehab program. Medically stable.  Continue current management.  Pain control  Bactrim for UTI      Danie Chandler, MD  PM&R

## 2013-10-09 NOTE — Rehab Progress Note ([facility]) (Signed)
[name]  MRN: [id number]  Account: [id number]  Session Start: 10/09/2013 11:00:00 AM  Session Stop: 10/09/2013 12:00:00 PM    Physical Therapy  Inpatient Rehabilitation Progress Note - Brief    Rehab Diagnosis: Gait Disturbance  Demographics:            Age: 75Y            Gender: Female  Rehabilitation Precautions/Restrictions:   Fall, skin, 10% WB RLE    SUBJECTIVE  Patient Report: Pt states "I usually take another pain pill before I go down to  therapy"  Pain: Patient currently has pain.  Patient reports a pain level of 3 out of 10. Patient medicated.    OBJECTIVE  General Observations: Pt received sitting in w/c agreeable to PT treatment at  this time; B shoulder analgesic patches donned, RLE dressing  Vital Signs:                       Current Value                Previous Value  Vitals  Time                 -                            1000 on 10/07/13  Position/Activity    -                            Seated in WC  BP Systolic          -                            108/72  Pulse                -                            66  Respirations         -                            17    Interventions:       Therapeutic Activities:  Pt session initiated with BLE exercises including:  2x20 ankle pumps, glut sets, quad sets. 2x10 R SLR with AAROM. Practice  transitions from sitting to standing with RW and S, NWB on RLE. Practice  transfers from mat to w/c x5 with CGA and RW. Pt self-propel w/c for 100' with  Min A. Pt returned to room for lunch with daughter present.  Pain Reassessment:  Response to Pain Intervention:  Post Intervention Pain Quality:  Aching.  Patient Reports Post Intervention Pain Level of: 3 out of 10  Pain Acceptable: Yes    Education Provided:  No education provided this session.    ASSESSMENT  Pt presents with continued improvement in transfers with appropriate  weight-bearing status. Pt will continue to benefit from PT to increase  functional mobility and decrease burden of  care.    PLAN  Continued Physical Therapy is recommended.  Recommended Frequency/Duration/Intensity: 1-2 hrs/day, 5-6 days/wk for 2 weeks  from evaluation on 10/04/13  Activities Contributing Toward Care Plan: Bed mobility training, transfer  training, balance training, gait training, stair training,  therapeutic  strengthening exercises, therapeutic activities, ROM exercises, endurance  activities, family/caregiver training, instruction in home exercise program,  education for home modifications, procurement of DME.    3 Hour Rule Minutes: 60 minutes of PT treatment this session count towards  intensity and duration of therapy requirement. Patient was seen for the full  scheduled time of PT treatment this session.    Signed by: Maryjean Ka, PT, DPT 10/09/2013 12:00:00 PM

## 2013-10-10 DIAGNOSIS — R269 Unspecified abnormalities of gait and mobility: Secondary | ICD-10-CM

## 2013-10-10 LAB — SEDIMENTATION RATE: Sed Rate: 59 — ABNORMAL HIGH (ref 0–20)

## 2013-10-10 LAB — COMPREHENSIVE METABOLIC PANEL
ALT: 6 U/L (ref 0–55)
AST (SGOT): 17 U/L (ref 5–34)
Albumin/Globulin Ratio: 0.9 (ref 0.9–2.2)
Albumin: 2.8 g/dL — ABNORMAL LOW (ref 3.5–5.0)
Alkaline Phosphatase: 84 U/L (ref 40–150)
BUN: 16 mg/dL (ref 7–21)
Bilirubin, Total: 0.5 mg/dL (ref 0.2–1.2)
CO2: 27 mEq/L (ref 22–29)
Calcium: 9.1 mg/dL (ref 7.9–10.6)
Chloride: 104 mEq/L (ref 98–107)
Creatinine: 0.9 mg/dL (ref 0.6–1.0)
Globulin: 3.2 g/dL (ref 2.0–3.6)
Glucose: 104 mg/dL — ABNORMAL HIGH (ref 70–100)
Potassium: 3.7 mEq/L (ref 3.5–5.1)
Protein, Total: 6 g/dL (ref 6.0–8.3)
Sodium: 142 mEq/L (ref 136–145)

## 2013-10-10 LAB — CBC WITH MANUAL DIFFERENTIAL
Band Neutrophils Absolute: 0 10*3/uL (ref 0.00–1.00)
Band Neutrophils: 0 %
Basophils Absolute Manual: 0.08 10*3/uL (ref 0.00–0.20)
Basophils Manual: 1 %
Cell Morphology: ABNORMAL — AB
Eosinophils Absolute Manual: 0.41 10*3/uL (ref 0.00–0.70)
Eosinophils Manual: 5 %
Hematocrit: 30.7 % — ABNORMAL LOW (ref 37.0–47.0)
Hgb: 9.7 g/dL — ABNORMAL LOW (ref 12.0–16.0)
Lymphocytes Absolute Manual: 1.95 10*3/uL (ref 0.50–4.40)
Lymphocytes Manual: 24 %
MCH: 26.5 pg — ABNORMAL LOW (ref 28.0–32.0)
MCHC: 31.6 g/dL — ABNORMAL LOW (ref 32.0–36.0)
MCV: 83.9 fL (ref 80.0–100.0)
MPV: 9.8 fL (ref 9.4–12.3)
Monocytes Absolute: 0.57 10*3/uL (ref 0.00–1.20)
Monocytes Manual: 7 %
Neutrophils Absolute Manual: 5.12 10*3/uL (ref 1.80–8.10)
Nucleated RBC: 0 (ref 0–1)
Platelets: 267 10*3/uL (ref 140–400)
RBC: 3.66 10*6/uL — ABNORMAL LOW (ref 4.20–5.40)
RDW: 15 % (ref 12–15)
Segmented Neutrophils: 63 %
WBC: 8.12 10*3/uL (ref 3.50–10.80)

## 2013-10-10 LAB — GFR: EGFR: >60

## 2013-10-10 LAB — C-REACTIVE PROTEIN: C-Reactive Protein: 3.5 mg/dL — ABNORMAL HIGH (ref 0.0–0.5)

## 2013-10-10 MED ORDER — TAB-A-VITE/BETA CAROTENE PO TABS
1.0000 | ORAL_TABLET | Freq: Every day | ORAL | Status: DC
Start: 2013-10-10 — End: 2013-10-14
  Administered 2013-10-10 – 2013-10-14 (×5): 1 via ORAL
  Filled 2013-10-10 (×5): qty 1

## 2013-10-10 NOTE — Rehab Progress Note ([facility]) (Signed)
NAMEKYMANI LAURSEN  MRN: [id number]  Account: [id number]  Session Start: 10/10/2013 2:00:00 PM  Session Stop: 10/10/2013 3:00:00 PM    Occupational Therapy  Inpatient Rehabilitation Progress Note - Brief    Rehab Diagnosis: Field  Demographics:            Age: Field            Gender: Field  Rehabilitation Precautions/Restrictions: Branch    SUBJECTIVE  Patient Report: Branch  Pain: Branch    OBJECTIVE  General Observation: Branch    Interventions:       Therapeutic Activities:  [id number]  Pain Reassessment: Copywriter, advertising Provided:  Branch    ASSESSMENT  Training and development officer    3 Hour Rule Minutes: 60 minutes of OT treatment this session count towards  intensity and duration of therapy requirement. Patient was seen for the full  scheduled time of OT treatment this session.    Signed by: Toney Reil, OTR/L 10/10/2013 3:00:00 PM

## 2013-10-10 NOTE — Rehab Progress Note ([facility]) (Addendum)
Corrected 10/10/2013 6:38:43 AM    NAME: [name]  MRN: [id number]  Account: [id number]  Session Start: 10/10/2013 12:00:00 AM  Session Stop: 10/10/2013 12:00:00 AM    Rehabilitation Nursing  Inpatient Rehabilitation Shift Assessment    Rehab Diagnosis: Gait Disturbance  Demographics:            Age: 78Y            Gender: Female  Primary Language: english    Date of Onset:  09-05-13  Date of Admission: 10/03/2013 6:43:00 PM    Rehabilitation Precautions Restrictions:   Fall, skin, 10% WB RLE    Patient Report: "doing Good"  Pain: Patient currently without complaints of pain.  Pain Reassessment: Pain was not reassessed as no pain was reported.  Patient/Caregiver Goals:  I want to get better so that I can go home.    NEURO  Orientation/Awareness: Alert and Oriented x3.  Speech: No deficits noted at this time.  Behavior: Cooperative.    MEDICATIONS  IV Access: No IV access.  Dialysis Access: Patient does not have dialysis access.      Elopement Risk Level Assessment Tool  Patient Criteria: Patient is not capable of leaving the unit.  Assessment is not  applicable.      RISK ASSESSMENT FOR FALLS/INJURY    MENTAL STATUS CRITERIA:   0 - None identified.  MENTAL STATUS TOTAL: 0    AGE CRITERIA:   76 - 76-76 years old  AGE TOTAL: 1    ELIMINATION CRITERIA:   3 - Toileting with Assistance.   ELIMINATION TOTAL: 3    HISTORY OF FALLS CRITERIA:   2 - Unknown History.  HISTORY OF FALLS TOTAL: 2    MEDICATIONS CRITERIA:   2 or more High Risk Medications (*see list below)   MEDICATIONS TOTAL: 2    PHYSICAL MOBILITY CRITERIA:   3 - Decreased balance reaction.   1 - Weakness/impaired physical mobility.   PHYSICAL MOBILITY TOTAL: 4    FALLS RISK ASSESSMENT TOTAL: 12    Patient's Fall Risk: TOTAL SCORE >10: High Risk    Falls Interventions: Clutter removed and clear path to BR.  Call bell, phone, glasses, etc within reach.  Hourly toileting/safety checks between 6am and 10pm, then every 2 hours.  Initiate Fall care plan and  outcome.  Yellow "high risk" patient identification in place: wrist band, socks, chart  sticker, door sign.  Pt and family education.  Assistive devices at [facility].  Bed alarm    NUTRITION  Diet:  Type: Regular.  Food Consistency: Regular.  Liquid Consistency: Thin.      CARDIOVASCULAR     Bilateral lower extremities  Nail Bed Color: Pink.   No edema or redness present. right leg swollen noted  Pulses:   Apical Pulse: Regular. Strong. Rate is .   Patient does not have a pacemaker.   Patient does not have a defibrillator.    CARDIOPULMONARY  Lung Sounds:   Upper lobes. Clear.   Lower lobes. Clear.  Type of Respirations: Regular.  Cough: No cough noted.  Respiratory Support: The patient does not require any respiratory support.  Respiratory Equipment: None.    INTEGUMENTARY  Skin:  Temperature: Warm  Turgor: Normal for age  Moisture: Dry  Color of skin: Normal for Race/Ethnicity  Capillary Refill: Less than 3 seconds  Wounds/Incisions:   Open wound at the right knee with scant amount of  serous drainage, changed  dressing done. Reinforced dressing to the surgical incission  anterior knee no  s/s of infections noted.  Braden Scale for Predicting Pressure Sore Risk: Sensory Perception: No  impairment  Moisture: Occasionally moist  Activity: Chairfast  Mobility: Slightly limited  Nutrition: Adequate  Friction and Shear: No apparent problem  Braden Score: 18  Level of Risk: At risk (15-18)   Pressure Relief every 30 minutes while in wheelchair   Turn patient every 2 hours    GASTROINTESTINAL  Abdomen: Soft. Nontender. Obese.  Bowel Sounds:  Active bowel sounds audible in all four quadrants.  Date of Last Bowel Movement:  10/10/13   No Problems/Complaints with Bowel Elimination Assessed.    GENITOURINARY  Current Bladder Pattern: Continent  Color:  Yellow   Patient denies problems with urination and/or catheter.    MUSCULOSKELETAL  Upper Body: none  Lower Body: RLE- RIGHT LOWER EXTREMITY 10% WB in extension. NO bending of R  knee    Functional Measures      TOILETING: Toileting Score = 6.  Patient is modified independent for toileting,  requiring: Safety considerations.    BLADDER MANAGEMENT - LEVEL OF ASSIST: Bladder Score = 7. Patient is completely  independent for bladder management. There are no activity limitations.    BLADDER ACCIDENTS THIS SHIFT:  0 . Patient has not had an accident this  assessment and did not require medications or devices.    BOWEL MANAGEMENT - LEVEL OF ASSIST: Bowel Score = 6.  Patient is modified  independent for bowel management.  Patient did not have bowel movement.  Medication/intervention was provided.    BOWEL ACCIDENTS THIS SHIFT: 0 . Patient has not had an accident, but used a  stool softener.    TRANSFER TOILET: Toilet Transfer Score = 5.  Patient is supervision/set-up for  transferring to and from the toilet/commode, requiring: Stand by assistance.  Set up (positioning equipment, lock brakes and/or adjust foot rests).  Patient requires the following assistive device(s):  Grab bars.    Education Provided:    Education Provided: Plan of care.       Audience: Patient.       Mode: Explanation.       Response: Needs practice.    Discharge: Patient is not being discharged at this time.    Long Term Goals: Within 2 weeks, patient will be able verbalize 2/10 on pain  scale on a consistent basis for effective pain management. - Goal Not Met  2 weeks from 10/03/2013  Short Term Goals: Within 2 weeks, patient will be able to state medications  being taken along with indications, and possible side effects. - Goal Not Met  2 weeks from 10/03/2013    PROGRESS TOWARD GOALS:    PLAN: Nursing Specific Interventions  Medication Management. Pain Management. Skin Management. Wound Management.  Continue with the current Nursing Plan of Care.    TEAM CARE PLAN  Identified problems from team documentation:  Problem: Impaired Mobility  Mobility: Primary Team Goal: Pt will be indep with all bed mobility and  transfers with  LRAD, indep with walking at least 110ft iwth LRAD, mod-max A for 4  steps with LRAD and HR/Active    Problem: Impaired Pain Management  Pain Mgmt: Primary Team Goal: Team will collaborate and work efficiently and  effectively in achieving manageable pain levels while participating in acute  rehab./    Problem: Impaired Self-care Mgmt/ADL/IADL  Self Care: Primary Team Goal: Patient will perform toileting transfer and task  with MOD I.  Caregiver will demo safe supervision for  bathing and LB dressing  according to family training./Active    Add/Update Problems from this assessment:  No updates at this time.    Please review Integrated Patient View Care Plan Flowsheet for Team identified  Problems, Interventions, and Goals.    Signed by: Tory Emerald, RN 10/10/2013 2:15:00 AM

## 2013-10-10 NOTE — Rehab Progress Note ([facility]) (Signed)
[name]  MRN: [id number]  Account: [id number]  Session Start: 10/10/2013 9:00:00 AM  Session Stop: 10/10/2013 10:00:00 AM    Physical Therapy  Inpatient Rehabilitation Progress Note - Brief    Rehab Diagnosis: Gait Disturbance  Demographics:            Age: 30Y            Gender: Female  Rehabilitation Precautions/Restrictions:   Fall, skin, 10% WB RLE    SUBJECTIVE  Patient Report: Pt states "I haven't gotten my breakfast yet"  Pain: Patient currently has pain.  Patient reports a pain level of 3 out of 10. Patient medicated.    OBJECTIVE  General Observations: Pt received in bed finished dressing and morning meds with  RN. B shoulder analgesic patches donned, RLE dressing  Vital Signs:                       Current Value                Previous Value  Vitals  Time                 -                            1000 on 10/07/13  Position/Activity    -                            Seated in WC  BP Systolic          -                            131/49  Pulse                -                            72  Respirations         -                            18    Interventions:       Therapeutic Activities:  Pt demo I with bed mobility from a flat surface  without the use of bed rails. Pt demo S for toileting and clothing management.  Pt self-propel w/c for 150' with increased time. Gait training with RW for 32'  10' then 12' with w/c follow and CGA. Pt perform transfers from w/c to mat with  S. Pt return to room for handoff to OT.  Pain Reassessment:  Response to Pain Intervention:  Post Intervention Pain Quality:  Aching.  Patient Reports Post Intervention Pain Level of: 3 out of 10  Pain Acceptable: Yes    Education Provided:  No education provided this session.    ASSESSMENT  Pt presents with continued difficulty ambulating with WB restrictions and  endurance limitations. Pt will benefit from continued therapy to increased  independence in preparation for d/c to another facility.    PLAN  Continued Physical Therapy  is recommended.  Recommended Frequency/Duration/Intensity: 1-2 hrs/day, 5-6 days/wk for 2 weeks  from evaluation on 10/04/13  Activities Contributing Toward Care Plan: Bed mobility training, transfer  training, balance training, gait training, stair training, therapeutic  strengthening exercises,  therapeutic activities, ROM exercises, endurance  activities, family/caregiver training, instruction in home exercise program,  education for home modifications, procurement of DME.    3 Hour Rule Minutes: 60 minutes of PT treatment this session count towards  intensity and duration of therapy requirement. Patient was seen for the full  scheduled time of PT treatment this session.    Signed by: Maryjean Ka, PT, DPT 10/10/2013 10:00:00 AM

## 2013-10-10 NOTE — Rehab Progress Note ([facility]) (Signed)
NAMECHRISTIONA [name]  MRN: [id number]  Account: [id number]  Session Start: 10/10/2013 12:00:00 AM  Session Stop: 10/10/2013 12:00:00 AM    Rehabilitation Nursing  Inpatient Rehabilitation Shift Assessment    Rehab Diagnosis: Gait Disturbance  Demographics:            Age: 78Y            Gender: Female  Primary Language: english    Date of Onset:  09-05-13  Date of Admission: 10/03/2013 6:43:00 PM    Rehabilitation Precautions Restrictions:   Fall, skin, 10% WB RLE    Patient Report: "That one medicine makes me nauseous."  Pain: Patient currently without complaints of pain.  Pain Reassessment: Pain was not reassessed as no pain was reported.  Patient/Caregiver Goals:  I want to get better so that I can go home.    NEURO  Orientation/Awareness: Alert and Oriented x4.  Speech: No deficits noted at this time.  Behavior: Cooperative.    MEDICATIONS  IV Access: No IV access.  Dialysis Access: Patient does not have dialysis access.      Elopement Risk Level Assessment Tool  Patient Criteria: Patient is not capable of leaving the unit.  Assessment is not  applicable.      RISK ASSESSMENT FOR FALLS/INJURY    MENTAL STATUS CRITERIA:   0 - None identified.  MENTAL STATUS TOTAL: 0    AGE CRITERIA:   76 - 76-76 years old  AGE TOTAL: 1    ELIMINATION CRITERIA:   3 - Toileting with Assistance.   ELIMINATION TOTAL: 3    HISTORY OF FALLS CRITERIA:   2 - Unknown History.  HISTORY OF FALLS TOTAL: 2    MEDICATIONS CRITERIA:   2 or more High Risk Medications (*see list below)   MEDICATIONS TOTAL: 2    PHYSICAL MOBILITY CRITERIA:   3 - Decreased balance reaction.   1 - Weakness/impaired physical mobility.   PHYSICAL MOBILITY TOTAL: 4    FALLS RISK ASSESSMENT TOTAL: 12    Patient's Fall Risk: TOTAL SCORE >10: High Risk    Falls Interventions: Clutter removed and clear path to BR.  Call bell, phone, glasses, etc within reach.  Hourly toileting/safety checks between 6am and 10pm, then every 2 hours.  Initiate Fall care plan and outcome.  Yellow "high  risk" patient identification in place: wrist band, socks, chart  sticker, door sign.  Pt and family education.  Assistive devices at [facility].    NUTRITION  Diet:  Type: Regular.  Food Consistency: Regular.  Liquid Consistency: Thin.      CARDIOVASCULAR     Right Lower Extremity  Nail Bed Color: Pink.   Right knee is swollen  and ATB given as ordered.  Pulses:   Apical Pulse: Regular. Strong. Rate is 75 .   Patient does not have a pacemaker.   Patient does not have a defibrillator.    CARDIOPULMONARY  Lung Sounds:   Upper lobes. Clear.   Lower lobes. Clear.  Type of Respirations:  Cough: No cough noted.  Respiratory Support: The patient does not require any respiratory support.  Respiratory Equipment: 96%    INTEGUMENTARY  Skin:  Temperature: Warm  Turgor: Elastic  Moisture: Dry  Color of skin: Normal for Race/Ethnicity  Capillary Refill: Less than 3 seconds  Wounds/Incisions:     Surgical Incision: Right knee incision. Length: 20 centimeter(s) with staples  and sutures. No signs of infection.  Drainage: Moderate amount of serous drainage.  Odor:  No  Incision Care: clean with NS, pat dry apply foam dressing.  Braden Scale for Predicting Pressure Sore Risk: Sensory Perception: No  impairment  Moisture: Rarely moist  Activity: Chairfast  Mobility: Slightly limited  Nutrition: Adequate  Friction and Shear: No apparent problem  Braden Score: 19  Level of Risk: No risk (19-23). Will reassess every shift.    GASTROINTESTINAL  Abdomen: Soft. Obese.  Bowel Sounds:  Active bowel sounds audible in all four quadrants.  Date of Last Bowel Movement:  10/10/13   No Problems/Complaints with Bowel Elimination Assessed.    GENITOURINARY  Current Bladder Pattern: Continent  Color:  Yellow   Patient denies problems with urination and/or catheter.    MUSCULOSKELETAL  Upper Body: none  Lower Body: RLE- RIGHT LOWER EXTREMITY 10% WB in extension. NO bending of R knee    Functional Measures    EATING: Eating Score = 6. Patient is modified  independent for eating, requiring:  More than reasonable time. much encouragement to eat.    TOILETING: Patient requires moderate assistance for adjusting clothing before  using a toilet, commode, bedpan, or urinal. Patient requires minimal assistance  for hygiene. Patient requires moderate assistance for adjusting clothing after  using a toilet, commode, bedpan, or urinal. Patient performs 0 -  24% of  toileting tasks.  Toileting Score = 1, Total Assistance.  Patient requires the following assistive device(s):  Grab bar.    BLADDER MANAGEMENT - LEVEL OF ASSIST: Bladder Score = 6. Patient is modified  independent for bladder management requiring: Adult brief    BLADDER ACCIDENTS THIS SHIFT:  0 . Patient has not had an accident but used an  adult brief this assessment.    BOWEL MANAGEMENT - LEVEL OF ASSIST: Bowel Score = 6. Patient is modified  independent for bowel management requiring: Stool softeners    BOWEL ACCIDENTS THIS SHIFT: 0 . Patient has not had an accident, but used a  stool softener.    TRANSFERS BED/CHAIR/WHEELCHAIR: Bed/chair/wheelchair Transfer Score = 3.  Patient performs 50-74% of effort and requires moderate assistance (some  lifting) for transferring to and from the bed/chair/wheelchair.  Patient requires the following assistive device(s): Bed rails.  Grab bars.  Seating system of wheelchair.    TRANSFER TOILET: Toilet Transfer Score = 3.  Patient performs 50-74% of effort  and requires moderate assistance (some lifting) for transferring to and from the  toilet/commode.  Patient requires the following assistive device(s):  Grab bars.    Education Provided:    Education Provided: Safety issues and interventions. Fall protocol. Medication.  Name and dosage.       Audience: Patient.       Mode: Explanation.       Response: Needs reinforcement.    Discharge: Patient is not being discharged at this time.    Long Term Goals: Within 2 weeks, patient will be able verbalize 2/10 on pain  scale on a  consistent basis for effective pain management. - Goal Not Met  2 weeks from 10/03/2013  Short Term Goals: Within 2 weeks, patient will be able to state medications  being taken along with indications, and possible side effects. - Goal Not Met  2 weeks from 10/03/2013    PROGRESS TOWARD GOALS: Pt with poor appetite and need much encouragement to eat.  Instructed with little food the can't be much healing for the knee wound. Pt  states she understands and will try to eat more. Will continue to monitor.    PLAN: Nursing  Specific Interventions  Medication Management. Pain Management. Skin Management. Wound Management.  Continue with the current Nursing Plan of Care.    TEAM CARE PLAN  Identified problems from team documentation:  Problem: Impaired Mobility  Mobility: Primary Team Goal: Pt will be indep with all bed mobility and  transfers with LRAD, indep with walking at least 49ft iwth LRAD, mod-max A for 4  steps with LRAD and HR/Active    Problem: Impaired Pain Management  Pain Mgmt: Primary Team Goal: Team will collaborate and work efficiently and  effectively in achieving manageable pain levels while participating in acute  rehab./    Problem: Impaired Self-care Mgmt/ADL/IADL  Self Care: Primary Team Goal: Patient will perform toileting transfer and task  with MOD I.  Caregiver will demo safe supervision for bathing and LB dressing  according to family training./Active    Add/Update Problems from this assessment:  No updates at this time.    Please review Integrated Patient View Care Plan Flowsheet for Team identified  Problems, Interventions, and Goals.    Signed by: Satira Anis, RN 10/10/2013 8:51:00 PM

## 2013-10-10 NOTE — Rehab Progress Note ([facility]) (Signed)
[name]  MRN: [id number]  Account: [id number]  Session Start: 10/10/2013 10:00:00 AM  Session Stop: 10/10/2013 11:00:00 AM    Occupational Therapy  Inpatient Rehabilitation Progress Note - Brief    Rehab Diagnosis: Gait Disturbance  Demographics:            Age: 9Y            Gender: Female  Rehabilitation Precautions/Restrictions:   Fall, skin, 10% WB RLE    SUBJECTIVE  Pain: Patient currently without complaints of pain.    OBJECTIVE    Interventions:       Therapeutic Activities:  Pt. completed various therapeutic activities  addressing functional standing tolerance and dynamic standing balance in order  to simulate toileting needs. Pt. completed SPT using RW with CG assist and sit  <> stand transitions with supervision. Pt. engaged in UE stregnthening exercises  in supine targeting all major UE muscle groups. Pt. remained seated in w/c with  needs met and call bell within reach.    ASSESSMENT  Pt. tolerated therapy session well and continues to make great progress towards  establsihed LTGs however, envitonemntal constraints impacts safe d/c to home  setting. Continue with OT POC in order to maximize independence with ADL.    PLAN  Continued Occupational Therapy is recommended.  Recommended Frequency/Duration/Intensity: 1-2 hours/day, 5-7days/week, for 14  days s/p eval on 10/05/13.  Continued Activities Contributing Toward Care Plan: Therapeutic exercise,  therapeutic activity, ADL/transfer training, balance training, endurance  training, pt/family education    3 Hour Rule Minutes: 60 minutes of OT treatment this session count towards  intensity and duration of therapy requirement. Patient was seen for the full  scheduled time of OT treatment this session.    Signed by: Ballard Russell, OT 10/10/2013 11:00:00 AM

## 2013-10-10 NOTE — Rehab Progress Note ([facility]) (Signed)
NAMEJENNFER GASSEN  MRN: [id number]  Account: [id number]  Session Start: 10/10/2013 12:00:00 AM  Session Stop: 10/10/2013 12:00:00 AM    SEVERE SEPSIS SCREEN  INFECTION:  Patient has documented infection. Is on antibiotic therapy (not  prophylaxis).    SYSTEMATIC IMFLAMMATORY RESPONSE SYNDROME (SIRS):  Patient  has no indication of  systematic inflammatory response syndrome (SIRS). Negative Sepsis Screen.  If you are unable to assess a system's dysfunction because you do not have labs,  or the labs you have are not current (within 24 hours), call physician and  request and order for the lab tests needed.    Signed by: Satira Anis, RN 10/10/2013 8:49:00 AM

## 2013-10-10 NOTE — Rehab Discharge Summary ([facility]) (Signed)
NAMECONYA ELLINWOOD  MRN: [id number]  Account: [id number]  Session Start: 10/10/2013 11:00:00 AM  Session Stop: 10/10/2013 12:00:00 PM    Therapeutic Recreation  Inpatient Rehabilitation Discharge Summary and Note    Rehab Diagnosis: Gait Disturbance  Demographics:            Age: 20Y            Gender: Female    Past Medical History: L. total knee replacement  History of Present Illness: This 78 y.o. year old female with history of R TKA  on 08/25/13 by Dr. Ahmed Prima was readmitted to [facility] on 09/05/13 and underwent  revisions on 09/05/13, 12/8 /14, 09/22/13 with muscle flap/ skin grafting by  plastic surgery on 09/22/13. The patient is to keep leg extended ; current WB  status is 10 %    Wound culture was positive for Proteus. Per ID ,continue Cipro through 10/27/13            Date of Onset:  09-05-13            Date of Admission: 10/03/2013 6:43:00 PM    Rehabilitation Precautions/Restrictions:   Fall, skin, 10% WB RLE    SUBJECTIVE  Patient Report: "I'll play cards."  Pain: Patient currently without complaints of pain.    OBJECTIVE  Leisure Participation:  Modified Independent for participation in leisure  activities.  Equipment Recommended: No leisure equipment was recommended at discharge.  Community Mobility: Patient Not assessed.    Functional Measures      SOCIAL INTERACTION: Social Interaction Score = 7, Independent. Patient is  completely independent for social interaction.  There are no activity  limitations.    Interventions: Therapeutic Activities. The following treatment was provided:  Socialization/Psychosocial: Pt. participated in card games of enjoyment.  Pain Reassessment: Pain was not reassessed as no pain was reported.    Education Provided:    Education Provided: Benefit and value of leisure activity.       Audience: Patient.       Mode: Explanation.       Response: Applied knowledge.  Verbalized understanding.  Demonstrated skill.    ASSESSMENT  Summary of Deficits and Recommended Follow-up: Pt. is at a mod  I level for  leisure activities. Pt. participated in card games, ceramic crafts and peer  mentor group during TR sessions. Pt. with good family support who are able to  assist with leisure activities. Continues to be limited by decreased independent  mobility. Recommend pt. stay active to continue to maximize level of activity  independence.  Progress Toward Goals:   LONG TERM GOAL REVIEW:       1. Pt. will participate in a leisure activity of choice with mod I upon  discharge. - Met       2. Pt. will improve strength and endurance as demonstrated through the  ability to perform activities upon discharge. - Met  Time frame to achieve above long term goal(s):  Discharge      PLAN  Therapeutic Recreation services are not recommended at this time due to: No need  for skilled therapy intervention at this time.    Care Plan  Identified problems from team documentation:  Problem: Impaired Mobility  Mobility: Primary Team Goal: Pt will be indep with all bed mobility and  transfers with LRAD, indep with walking at least 19ft iwth LRAD, mod-max A for 4  steps with LRAD and HR/Active    Problem: Impaired Pain Management  Pain Mgmt: Primary Team Goal:  Team will collaborate and work efficiently and  effectively in achieving manageable pain levels while participating in acute  rehab./    Problem: Impaired Self-care Mgmt/ADL/IADL  Self Care: Primary Team Goal: Patient will perform toileting transfer and task  with MOD I.  Caregiver will demo safe supervision for bathing and LB dressing  according to family training./Active    Status update for discharge:      Please review Integrated Patient View Care Plan Flowsheet for Team identified  Problems, Interventions, and Goals.    Signed by: Pearson Forster, CTRS 10/10/2013 12:00:00 PM

## 2013-10-10 NOTE — Progress Notes (Signed)
[facility] Physiatry Attending Face to Face Progress Note    Date Time: 10/10/2013 2:54 PM  Patient Name: [name], [name]    Admission date:  10/03/2013    Functional Status/Update:     [x] I I reviewed patient's therapy notes to assess functional status and ongoing need for therapies.   Of note:  Pt demo I with bed mobility from a flat surface   without the use of bed rails. Pt demo S for toileting and clothing management.   Pt self-propel w/c for 150' with increased time. Gait training with RW for 79'   10' then 12' with w/c follow and CGA. Pt perform transfers from w/c to mat with S.     []  Patient discussed at length today at inter-disciplinary team rounds-conference ;report is pending in [facility]  Subjective and interval development:   Patient slept better , says knee pain is controlled at 3/10; knee incision cont to drain  Review of Systems:   A comprehensive review of systems was: No fevers, chills, nausea, vomiting, chest pain, shortness of breath, cough, headache, double vision    Objective:   Vitals:  Filed Vitals:    10/09/13 0900 10/09/13 2007 10/10/13 0648 10/10/13 1016   BP: 138/59 140/75 131/49 120/62   Pulse: 73 67 72 75   Temp:  97 F (36.1 C) 97.9 F (36.6 C)    TempSrc:  Oral Oral    Resp:  18 18    SpO2:  92% 96%      Physical Examination:  General: in NAD  CV:+S1S2 Heart rate and rhythm are regular.   Pulm:Lungs are clear to auscultation bilaterally.   ZHY:QMVH. Non-tender. Normoactive bowel sounds.   Skin:  Healing R knee incision with serosang drainage , min edema  Neuro : alert, smiling; oriented  .Sensation to LT is intact.Normal muscle tone and bulk  MMS; mild tenderness at B deltoids;   MMT- grossly 5/5 except R KF/KE- NT due to immobilization  Extremities: no calf tenderness  New Labs:   No results found for this basename: GLUCOSEWHOLE:10 in the last 24 hours      Lab 10/10/13 0635 10/06/13 1629   WBC 8.12 9.17   HGB 9.7* 10.4*   HCT 30.7* 33.4*   PLT 267 301          Lab 10/10/13 0635 10/06/13  1629   NA 142 142   K 3.7 3.6   CL 104 102   CO2 27 31*   BUN 16 16   CREAT 0.9 0.8   CA 9.1 9.2   ALB 2.8* 2.9*   PROT 6.0 6.5   BILITOTAL 0.5 0.7   ALKPHOS 84 82   ALT 6 8   AST 17 15   GLU 104* 113*   MG -- --   PHOS -- --   SED -53; CRP 6.2  UA; large LE  UC- E Coli  No MRSA  No results found for this basename: INR:2,APTT:2 in the last 168 hours       Rads:       Current medications:     Scheduled Meds: PRN Meds:           amLODIPine 10 mg Oral Daily   bisoprolol 5 mg Oral Daily   ciprofloxacin 750 mg Oral Q12H SCH   cloNIDine 0.1 mg Oral Daily   docusate sodium 100 mg Oral Q12H SCH   ferrous sulfate 324 mg-ascorbic acid 500 mg combo dose  Oral Daily   gabapentin  300 mg Oral Q12H SCH   hydrochlorothiazide 25 mg Oral Daily   lidocaine 2 patch Transdermal Q24H   multivitamin 1 tablet Oral Daily   polycarbophil 1,250 mg Oral Daily   potassium chloride 10 mEq Oral Daily   Senna 17.2 mg Oral QHS   sulfamethoxazole-trimethoprim 1 tablet Oral Q12H SCH   tiotropium 18 mcg Inhalation QAM   traMADol 50 mg Oral Q6H SCH       Continuous Infusions:         albuterol 2.5 mg Q4H PRN   bisacodyl 10 mg QD PRN   diphenhydrAMINE 25 mg Q6H PRN   HYDROcodone-acetaminophen 1 tablet Q4H PRN   HYDROcodone-acetaminophen 2 tablet Q6H PRN   hydrOXYzine 25 mg Q12H PRN   naloxone 0.1 mg PRN   ondansetron 4 mg Q8H PRN   oxyCODONE 10 mg Q12H PRN   polyethylene glycol 17 g QD PRN   senna-docusate 2 tablet BID PRN   traZODone 100 mg QHS PRN   zolpidem 5 mg QHS PRN         Assessment:   78 y.o. 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/25/13, revisions on 09/05/13, 12/8 /14,  09/22/13 with muscle flap/ skin grafting on 09/22/13 :incision monitoring, daily  therahoney dressing change, 10% WB to R LE; cipro through 10/27/12   Patient must keep R knee extended at all times- should not use knee immob due to pressure   wound nurse consult appreciated  Per patient's request , orthopedic surgeon  F/u requested  Poor wound healing: Juven ,Boost Breeze ; will add MVI     Insomnia: trazodone PRN   Pain: lidoderm to B shoulders  Constipation : controlled; Miralax changed to PRN  COPD: spiriva, albuterol, IS     E Coli UTI: resistant to Cipro,  Bactrim until 10/13/13      Coordination of care:  Care coordinated with  RN    Discharge planning: changed to SNF next week, 10/14/13 due to patient's inability to negotiate stairs secondary to fused knee    Signed by: Earl Many DO     [facility]

## 2013-10-10 NOTE — Rehab Progress Note ([facility]) (Signed)
[name]  MRN: [id number]  Account: [id number]  Session Start: 10/08/2013 12:00:00 AM  Session Stop: 10/08/2013 12:00:00 AM    Case Management  Inpatient Rehabilitation Team Conference    Conference Date/Time: 10/08/2013 1:00:00 PM    Demographics            Age: 78Y            Gender: Female    Admission Date: 10/03/2013 6:43:00 PM  Diagnosis: Gait Disturbance  Comorbidities:    VITAL SIGNS  Blood Pressure: 112/57 mmHg  Temperature:  degrees  Pulse: 66 beats per minute  Respirations: 18 breaths per minute  Pain: 3/10    WEIGHT and NUTRITION  Admission Weight: 0 pounds; Current Weight: 246pounds  Weight Change since Admit: Patient gained 246 pounds since admission.  Food Consistency: Regular  Liquid Consistency:None    Plan Of Care  Team Members Attending : Dr. Timmie Foerster DO, Patrice Paradise CM, Stephanie Acre RN,  Olegario Messier White-Wenger PT, Alita Chyle OT,  Anticipated Discharge Date/Estimated Length of Stay: 1/6  Anticipated Discharge Destination: Facility setting  Discharge Plan :  Medical Necessity Expected Level Rationale: 78 y.o. female with  dysfunction of  mobility/ ADL due to complicated R TKA on 08/25/13, revisions on 09/05/13, 12/8  /14, 09/22/13 with muscle flap/ skin grafting on 09/22/13  Intensity and Duration: an average of 3 hours/5 days per week  Medical Supervision and 24 Hour Rehab Nursing: x  Physical Therapy: x  PT Intensity/Duration: 60-120'daily for 5-6 d/wk  Occupational Therapy: x  OT Intensity/Duration: 60-120'daily for 5-6 d/wk    The following is a list of patient problems that have been identified by the  interdisciplinary team:    Problem: Impaired Mobility  Mobility Status Update: Pt requires SPV with bed <-> WC transfers, close SPV for  ambulation (18 ft), SPV for WC mobility (about 30 feet), and unable to safely  negotiate stairs at this time.  Team Identified Barrier to Discharge: Yes  Interventions:  Transfer training: Active  Gait training: Active  Mobility: Primary Team  Goal/Status: Pt will be indep with all bed mobility and  transfers with LRAD, indep with walking at least 52ft iwth LRAD, mod-max A for 4  steps with LRAD and HR / Active    Problem: Impaired Pain Management  Pain Mgmt: Primary Team Goal/Status: Team will collaborate and work efficiently  and effectively in achieving manageable pain levels while participating in acute  rehab. /    Problem: Impaired Self-care Mgmt/ADL/IADL  Self Care/ADL/IADL Status Update: Patient requires MIN A w/ bathing/LB  dressing/ADL transfers and SBA w/ UB dressing/grooming.  Team Identified Barrier to Discharge: Yes  Interventions:  Adaptive equipment training: Active  Compensatory strategies: Active  Encourage patient to participate in activities of daily living: Active  Self Care: Primary Team Goal/Status: Patient will perform toileting transfer and  task with MOD I.  Caregiver will demo safe supervision for bathing and LB  dressing according to family training. / Active    Fall Risk Assessment: TOTAL SCORE >10: High Risk    Functional Measures  Eating: 6  Grooming: 5  Bathing: 5  Upper Body Dressing: 5  Lower Body Dressing: 4  Toileting: 2  Bladder Level of Assistance: 7  Bowel Level of Assistance: 6  Bed/Chair/Wheelchair Transfer: 4  Toilet Transfer: 4  Tub Transfer:  Engineer, structural: 4  Wheelchair: 1  Walk: 1  Stairs: 0  Comprehension: 2  Expression: 7  Social Interaction: 6  Problem Solving: 5  Memory: 5  Bladder Frequency of Accidents - Admission Period: IRF Score = 6 - No accidents;  uses device  Bowel Frequency of Accidents - Admission Period: IRF Score = 6 - No accidents;  uses device    KEY TO FIM LEVELS (General)  7 The patient completes the task or skill independently in a timely manner.  6 The patient completes the task or skill without a helper but uses an assistive  device, has mild difficulty or needs extra time.  5 The patient performs 100% of the task with supervision.  4 The patient performs 75-90% of the effort to  complete the task or skill.  3 The patient performs 50-74% of the effort to complete the task or skill.  2 The patient performs 25-49% of the effort to complete the task or skill.  1 The patient performs 0-24% of the effort to complete the task or skill.  0 The task or skill does not occur because it is unsafe, contraindicated or the  patient refused.    Walk/Wheelchair Functional Measures scoring parameters:  7 Patient walks/propels a minimum of 150 feet independently.  6 Patient walks/propels a minimum of 150 feet without a helper but with a device  or safety concerns.  5 Patient walks/propels 50 feet independently OR walks a minimum of 150 feet  with supervision.  4 Patient performs 75-90% of the effort to walk/propel a minimum of 150 feet.  3 Patient performs 50-74% of the effort to walk/propel a minimum of 150 feet.  2 Patient walks/propels a minimum of 50 feet with any amount of assistance from  a helper.  1 Patient walks/propels less than 50 feet with any amount of assistance or needs  two helpers for any distance.    Stair Functional Measures scoring parameters:  7 Patient goes up and down 12 to 14 stairs independently.  6 Patient goes up and down 12 to 14 stairs with a device but without a helper.  5 Patient goes up and down 4 to 6 stairs independently OR 12 to 14 stairs with  supervision.  4 Patient performs 75-90% of the effort to go up and down 12 to 14 stairs.  3 Patient performs 50-74% of the effort to go up and down 12 to 14 stairs.  2 Patient goes up and down 4 to 6 stairs with any amount of assistance from a  helper.  1 Patient goes up and down less than 4 to 6 stairs, needs two helpers for any  number of stairs or uses a stair lift.    Comments: Branch    Signed by: Patrice Paradise, RN, MSN 10/08/2013 3:41:00 PM    Physician CoSigned By: Hendricks Limes 10/10/2013 13:40:14

## 2013-10-10 NOTE — Rehab Progress Note ([facility]) (Addendum)
Corrected 10/10/2013 8:43:30 PM    NAME: JAKYIA GACCIONE  MRN: [id number]  Account: [id number]  Session Start: 10/10/2013 12:00:00 AM  Session Stop: 10/10/2013 12:00:00 AM    SEVERE SEPSIS SCREEN  INFECTION:  Patient has documented infection. Is on antibiotic therapy (not  prophylaxis).    SYSTEMATIC IMFLAMMATORY RESPONSE SYNDROME (SIRS):  Patient  has no indication of  systematic inflammatory response syndrome (SIRS). Negative Sepsis Screen.  If you are unable to assess a system's dysfunction because you do not have labs,  or the labs you have are not current (within 24 hours), call physician and  request and order for the lab tests needed.    Signed by: Parks Neptune, RN 10/10/2013 8:36:00 PM

## 2013-10-11 NOTE — Rehab Progress Note ([facility]) (Signed)
[name]  MRN: [id number]  Account: [id number]  Session Start: 10/10/2013 12:00:00 AM  Session Stop: 10/10/2013 12:00:00 AM    Rehabilitation Nursing  Inpatient Rehabilitation Shift Assessment    Rehab Diagnosis: Gait Disturbance  Demographics:            Age: 78Y            Gender: Female  Primary Language: english    Date of Onset:  09-05-13  Date of Admission: 10/03/2013 6:43:00 PM    Rehabilitation Precautions Restrictions:   Fall, skin, 10% WB RLE    Patient Report: "Am doing well"  Pain: Patient currently without complaints of pain.  Pain Reassessment: Pain was not reassessed as no pain was reported.  Patient/Caregiver Goals:  I want to get better so that I can go home.    NEURO  Orientation/Awareness: Alert and Oriented x4.  Speech: Clear.  Behavior: Cooperative.    MEDICATIONS  IV Access: No IV access.  Dialysis Access: Patient does not have dialysis access.      Elopement Risk Level Assessment Tool  Patient Criteria: Patient is not capable of leaving the unit.  Assessment is not  applicable.      RISK ASSESSMENT FOR FALLS/INJURY    MENTAL STATUS CRITERIA:   0 - None identified.  MENTAL STATUS TOTAL: 0    AGE CRITERIA:   78 - 32-79 years old  AGE TOTAL: 1    ELIMINATION CRITERIA:   3 - Toileting with Assistance.   ELIMINATION TOTAL: 3    HISTORY OF FALLS CRITERIA:   2 - Unknown History.  HISTORY OF FALLS TOTAL: 2    MEDICATIONS CRITERIA:   2 or more High Risk Medications (*see list below)   MEDICATIONS TOTAL: 2    PHYSICAL MOBILITY CRITERIA:   1 - Weakness/impaired physical mobility.   3 - Decreased balance reaction.   PHYSICAL MOBILITY TOTAL: 4    FALLS RISK ASSESSMENT TOTAL: 12    Patient's Fall Risk: TOTAL SCORE >10: High Risk    Falls Interventions: Clutter removed and clear path to BR.  Yellow "high risk" patient identification in place: wrist band, socks, chart  sticker, door sign.  Pt and family education.  Assistive devices at [facility].    NUTRITION  Diet:  Type: Regular.  Food Consistency:  Regular.  Liquid Consistency: Thin.      CARDIOVASCULAR     Bilateral lower extremities  Nail Bed Color: Pink.   Bilateral lower extremities swollen  Pulses:   Apical Pulse: Regular. Strong. Rate is 68 .   Patient does not have a pacemaker.   Patient does not have a defibrillator.    CARDIOPULMONARY  Lung Sounds:   Upper lobes. Clear.   Lower lobes. Clear.  Type of Respirations: Regular.  Cough: No cough noted.  Respiratory Support: The patient does not require any respiratory support.  Respiratory Equipment: None.    INTEGUMENTARY  Skin:  Temperature: Warm  Turgor: Normal for age  Moisture: Dry  Color of skin: Normal for Race/Ethnicity  Capillary Refill: Less than 3 seconds  Wounds/Incisions: No wounds or incisions.  Braden Scale for Predicting Pressure Sore Risk: Sensory Perception: No  impairment  Moisture: Rarely moist  Activity: Chairfast  Mobility: Slightly limited  Nutrition: Adequate  Friction and Shear: No apparent problem  Braden Score: 19  Level of Risk: No risk (19-23). Will reassess every shift.    GASTROINTESTINAL  Abdomen: Soft. Nontender.  Bowel Sounds:  Active bowel sounds audible in  all four quadrants.  Date of Last Bowel Movement:  10/08/13   No Problems/Complaints with Bowel Elimination Assessed.    GENITOURINARY  Current Bladder Pattern: Continent  Color:  Yellow   Patient denies problems with urination and/or catheter.    MUSCULOSKELETAL  Upper Body: none  Lower Body: RLE- RIGHT LOWER EXTREMITY 10% WB in extension. NO bending of R knee    Functional Measures    EATING: Eating Score = 7. Patient is completely independent for eating.  There  are no activity limitations.    TOILETING: Patient requires no physical assistance for adjusting clothing before  using a toilet, commode, bedpan, or urinal. Patient requires maximal assistance  for hygiene. Patient requires no physical assistance for adjusting clothing  after using a toilet, commode, bedpan, or urinal. Patient performs 66.67 % of  toileting  tasks. Toileting Score = 3, Moderate Assistance.  Patient requires the following assistive device(s):  Adaptive device to maintain  balance.  Grab bar.    BLADDER MANAGEMENT - LEVEL OF ASSIST: Bladder Score = 7. Patient is completely  independent for bladder management. There are no activity limitations.    BLADDER ACCIDENTS THIS SHIFT:  0 . Patient has not had an accident this  assessment and did not require medications or devices.    BOWEL MANAGEMENT - LEVEL OF ASSIST: Bowel Score = 6.  Patient is modified  independent for bowel management.  Patient did not have bowel movement.  Medication/intervention was provided.    BOWEL ACCIDENTS THIS SHIFT: 0 . Patient has not had an accident, but used a  stool softener.    TRANSFERS BED/CHAIR/WHEELCHAIR: Bed/chair/wheelchair Transfer Score = 3.  Patient performs 50-74% of effort and requires moderate assistance (some  lifting)  for transferring to and from the bed/chair/wheelchair, including  assist lifting both legs.  Patient requires the following assistive device(s): Bed rails.  Elevated head of bed.  Sliding board.    TRANSFER TOILET: Toilet Transfer Score = 3.  Patient performs 50-74% of effort  and requires moderate assistance (some lifting) for transferring to and from the  toilet/commode.  Patient requires the following assistive device(s):  Grab bars.  Bedside Commode.    Education Provided:    Education Provided: Pain management. Pain scale. Medication options. Side  effects. Safety issues and interventions. Use of adaptive devices. Supervision  requirements. Repeated re-orientation.       Audience: Patient.       Mode: Explanation.       Response: Verbalized understanding.    Discharge: Patient is not being discharged at this time.    Long Term Goals: Within 2 weeks, patient will be able verbalize 2/10 on pain  scale on a consistent basis for effective pain management. - Goal Not Met  2 weeks from 10/03/2013  Short Term Goals: Within 2 weeks, patient will be able  to state medications  being taken along with indications, and possible side effects. - Goal Not Met  2 weeks from 10/03/2013    PROGRESS TOWARD GOALS:    PLAN: Nursing Specific Interventions  Medication Management. Pain Management. Skin Management. Wound Management.  Discontinuing current Nursing Plan of Care is recommended.    TEAM CARE PLAN  Identified problems from team documentation:  Problem: Impaired Mobility  Mobility: Primary Team Goal: Pt will be indep with all bed mobility and  transfers with LRAD, indep with walking at least 60ft iwth LRAD, mod-max A for 4  steps with LRAD and HR/Active    Problem: Impaired Pain  Management  Pain Mgmt: Primary Team Goal: Team will collaborate and work efficiently and  effectively in achieving manageable pain levels while participating in acute  rehab./    Problem: Impaired Self-care Mgmt/ADL/IADL  Self Care: Primary Team Goal: Patient will perform toileting transfer and task  with MOD I.  Caregiver will demo safe supervision for bathing and LB dressing  according to family training./Active    Add/Update Problems from this assessment:  No updates at this time.    Please review Integrated Patient View Care Plan Flowsheet for Team identified  Problems, Interventions, and Goals.    Signed by: Parks Neptune, RN 10/10/2013 11:26:00 PM

## 2013-10-11 NOTE — Progress Notes (Signed)
INFECTIOUS DISEASES PROGRESS NOTE    Date Time: 10/11/2013 2:27 PM  Patient Name: [name], [name]      Assessment and Recommendations:     Patient Active Problem List   Diagnosis   . Total knee replacement status   . Low back pain   . Chronic obstructive pulmonary disease   . Hypertension   . 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    Pt underwent resection arthroplasty/abx spacer placement  On 12/8.    Unfortunately, wound failed to heal and pt had a right   Media gastroc muscle flap on 09/22/13.  Per disc with plastics, gastroc flap has been healing   Nicely.    Pt transferred to rehab service on 12/26.  Oral ciprofloxacin was continued for tx of proteus pji    Screening urine cx from 12/26 grew 10-30,000 e. Coli  On 12/29.  Bactrim DS bid started that date.      Anticipate continuing cipro x 6 weeks after last surgery - ie  Through 11/03/13.    [place] Bactrim           Subjective:   6 wks after resection   09/15/13 - date -  10/27/13    6 weeks after flap closure - date - 09/22/13 -  11/03/13    Antibiotics:   Cipro 750 bid  ----->    Bactrim 12/29---->1/    Medications:     Current Facility-Administered Medications   Medication Dose Route Frequency   . amLODIPine  10 mg Oral Daily   . bisoprolol  5 mg Oral Daily   . ciprofloxacin  750 mg Oral Q12H SCH   . cloNIDine  0.1 mg Oral Daily   . docusate sodium  100 mg Oral Q12H SCH   . ferrous sulfate 324 mg-ascorbic acid 500 mg combo dose   Oral Daily   . gabapentin  300 mg Oral Q12H SCH   . hydrochlorothiazide  25 mg Oral Daily   . lidocaine  2 patch Transdermal Q24H   . multivitamin  1 tablet Oral Daily   . polycarbophil  1,250 mg Oral Daily   . potassium chloride  10 mEq Oral Daily   . Senna  17.2 mg Oral QHS   . sulfamethoxazole-trimethoprim  1 tablet Oral Q12H SCH   . tiotropium  18 mcg Inhalation QAM   . traMADol  50 mg Oral Q6H [facility]       Central Access/Endovascular  Hardware:     Active PICC Line / CVC Line / PIV Line / Drain / Airway / Intraosseous Line / Epidural Line / ART Line / Line / Wound / Pressure Ulcer / NG/OG Tube     Name   Placement date   Placement time   Site   Days    OnQ Pump (Incisional) 08/25/13 knee Right  08/25/13   0939   knee   47    Peripheral IV 09/24/13 Left Hand  09/24/13   0730   Hand   17    Closed/Suction Drain Right Knee 10 Fr.  09/05/13   1401   Knee   36    Epidural Catheter          --    Incision Site 09/05/13 Knee Right  09/05/13   1437     35    Incision Site 09/15/13 Knee Right  09/15/13   1517     25    Incision Site 09/22/13 Leg Right  09/22/13   1901     18          Review of Systems:   Patient is without fevers, chills, nausea, vomiting, diarrhea, abdominal pain, cough, dyspnea, headache, rash.      Physical Exam:     Filed Vitals:    10/10/13 1016 10/10/13 1926 10/11/13 0517 10/11/13 1033   BP: 120/62 135/74 121/64 129/93   Pulse: 75 68 75 76   Temp:  97.2 F (36.2 C) 98.2 F (36.8 C)    TempSrc:  Oral Oral    Resp:  17 18    SpO2:  97% 94%        Temp (24hrs), Avg:97.7 F (36.5 C), Min:97.2 F (36.2 C), Max:98.2 F (36.8 C)        In no apparent distress  Sclera anicteric, conjunctivae clear  Oral pharynx s erythema, exudate, or other significant lesions  No Thrush  Lungs Clear  Heart Regular rate and rhythm s murmurs, rubs, or gallops  Abdomen Soft, non tender, non distended, normal bowel sounds  Back No Costovertebral angle tenderness, no point tenderness along the spine  Extremities right leg with mild edema, right total knee   Wound and gastroc flap harvest sites with clean, dry,  Intact dressings.  Skin without rash     Labs:     Results     ** No Results found for the last 24 hours. **          Lab 10/10/13 0635   WBC 8.12   HGB 9.7*   HCT 30.7*   PLT 267        Lab 10/10/13 0635   NA 142   K 3.7   CL 104   CO2 27   BUN 16   CREAT 0.9   CA 9.1   ALB 2.8*   PROT 6.0   BILITOTAL 0.5   ALKPHOS 84   ALT 6   AST 17   GLU 104*      No results found for this basename: INR,APTT in the last 168 hours         12/8 wound cxs negative    11/28 wound cxs with proteus sens amp      Rads:     Radiology Results (24 Hour)     ** No Results found for the last 24 hours. **          Signed by: Ray Church, MD

## 2013-10-11 NOTE — Rehab Progress Note ([facility]) (Signed)
[name]  MRN: [id number]  Account: [id number]  Session Start: 10/11/2013 12:00:00 AM  Session Stop: 10/11/2013 12:00:00 AM    SEVERE SEPSIS SCREEN  INFECTION:  Patient has documented infection. Is on antibiotic therapy (not  prophylaxis).    SYSTEMATIC IMFLAMMATORY RESPONSE SYNDROME (SIRS):  Patient  has no indication of  systematic inflammatory response syndrome (SIRS). Negative Sepsis Screen.  If you are unable to assess a system's dysfunction because you do not have labs,  or the labs you have are not current (within 24 hours), call physician and  request and order for the lab tests needed.    Signed by: Sherlyn Lick, RN 10/11/2013 9:00:00 PM

## 2013-10-11 NOTE — Progress Notes (Signed)
[facility] Physiatry Attending Brief Note  [x] Discussed with nurse.  [x] No new events    Subjective:  Feels well and is without complaints. Pain is controlled with current meds. No chest pain. No shortness of breath. No other complaints.    Objective:  Vitals: BP 129/93  Pulse 76  Temp 98.2 F (36.8 C) (Oral)  Resp 18  SpO2 94%    Awake and Alert, No acute distress, Resting comfortably, No respiratory distress and No calf tenderness    New labs   Results     ** No Results found for the last 24 hours. **            Current Medications:  Current Facility-Administered Medications   Medication Dose Route Frequency   . amLODIPine  10 mg Oral Daily   . bisoprolol  5 mg Oral Daily   . ciprofloxacin  750 mg Oral Q12H SCH   . cloNIDine  0.1 mg Oral Daily   . docusate sodium  100 mg Oral Q12H SCH   . ferrous sulfate 324 mg-ascorbic acid 500 mg combo dose   Oral Daily   . gabapentin  300 mg Oral Q12H SCH   . hydrochlorothiazide  25 mg Oral Daily   . lidocaine  2 patch Transdermal Q24H   . multivitamin  1 tablet Oral Daily   . polycarbophil  1,250 mg Oral Daily   . potassium chloride  10 mEq Oral Daily   . Senna  17.2 mg Oral QHS   . sulfamethoxazole-trimethoprim  1 tablet Oral Q12H SCH   . tiotropium  18 mcg Inhalation QAM   . traMADol  50 mg Oral Q6H SCH       Assessment/Plan: 78 y.o. female with complicated TKA and revisions    Patient Active Problem List   Diagnosis   . Total knee replacement status   . Low back pain   . Chronic obstructive pulmonary disease   . Hypertension   . Debility following multiple R knee revisions       Continue comprehensive intensive inpatient rehab program. Medically stable.  Continue current management.  Pain control  Bactrim for UTI  Continue cipro      Danie Chandler, MD  PM&R

## 2013-10-11 NOTE — Rehab Progress Note ([facility]) (Signed)
NAMEDEBE ANFINSON  MRN: [id number]  Account: [id number]  Session Start: 10/11/2013 12:00:00 AM  Session Stop: 10/11/2013 12:00:00 AM    SEVERE SEPSIS SCREEN  INFECTION:  Patient has documented infection. Is on antibiotic therapy (not  prophylaxis).    SYSTEMATIC IMFLAMMATORY RESPONSE SYNDROME (SIRS):  Patient  has no indication of  systematic inflammatory response syndrome (SIRS). Negative Sepsis Screen.  If you are unable to assess a system's dysfunction because you do not have labs,  or the labs you have are not current (within 24 hours), call physician and  request and order for the lab tests needed.    Signed by: Justice Rocher, RN 10/11/2013 8:15:00 AM

## 2013-10-12 NOTE — Rehab Progress Note ([facility]) (Signed)
[name]  MRN: [id number]  Account: [id number]  Session Start: 10/12/2013 12:00:00 AM  Session Stop: 10/12/2013 12:00:00 AM    Rehabilitation Nursing  Inpatient Rehabilitation Shift Assessment    Rehab Diagnosis: Gait Disturbance  Demographics:            Age: 78Y            Gender: Female  Primary Language: english    Date of Onset:  09-05-13  Date of Admission: 10/03/2013 6:43:00 PM    Rehabilitation Precautions Restrictions:   Fall, skin, 10% WB RLE    Patient Report: "Okay"  Pain: Patient currently has pain.  Location: Right leg  Type: Acute  Quality: Gnawing.  Pain Scale: Numeric.  Patient reports a pain level of 4 out of 10.  Patient's acceptable level of pain 0 out of 10.   Pain does not interfere with any activity at this time.  Pain is alleviated by: Medication  Pain is exacerbated by: Movement Patient medicated. Received tramadol  Repositioned patient.  Pain Reassessment:  Response to Pain Intervention: Patient verbalised pain relief  Post Intervention Pain Quality:  None.  Patient Reports Post Intervention Pain Level of: 0 out of 10  Pain Acceptable: Yes  Patient/Caregiver Goals:  I want to get better so that I can go home.    NEURO  Orientation/Awareness: Alert and Oriented x3.  Speech: Verbalizes Clear Sentences.  Behavior: Cooperative.    MEDICATIONS  IV Access: No IV access.  Dialysis Access: Patient does not have dialysis access.      Elopement Risk Level Assessment Tool  Patient Criteria: Patient is not capable of leaving the unit.  Assessment is not  applicable.      RISK ASSESSMENT FOR FALLS/INJURY    MENTAL STATUS CRITERIA:   0 - None identified.  MENTAL STATUS TOTAL: 0    AGE CRITERIA:   68 - 64 years old and above  AGE TOTAL: 2    ELIMINATION CRITERIA:   3 - Toileting with Assistance.   ELIMINATION TOTAL: 3    HISTORY OF FALLS CRITERIA:   2 - Unknown History.  HISTORY OF FALLS TOTAL: 2    MEDICATIONS CRITERIA:   2 or more High Risk Medications (*see list below)   MEDICATIONS TOTAL:  2    PHYSICAL MOBILITY CRITERIA:   3 - Decreased balance reaction.   1 - Weakness/impaired physical mobility.   PHYSICAL MOBILITY TOTAL: 4    FALLS RISK ASSESSMENT TOTAL: 13    Patient's Fall Risk: TOTAL SCORE >10: High Risk    Falls Interventions: Clutter removed and clear path to BR.  Call bell, phone, glasses, etc within reach.  Hourly toileting/safety checks between 6am and 10pm, then every 2 hours.  Yellow "high risk" patient identification in place: wrist band, socks, chart  sticker, door sign.  Pt and family education.  Assistive devices at [facility].  Reoriented PRN.  Bed alarm    NUTRITION  Diet:  Type: Regular.  Food Consistency: Regular.  Liquid Consistency: Thin.      CARDIOVASCULAR     Right Lower Extremity  Nail Bed Color: Pink.   Swollen RLE S/p surgical procedures   Left Lower Extremity  Nail Bed Color: Pink.   No edema or redness present.  Pulses:   Apical Pulse: Regular. Strong. Rate is 76 .   Patient does not have a pacemaker.   Patient does not have a defibrillator.    CARDIOPULMONARY  Lung Sounds:   Upper lobes. Clear.  Lower lobes. Clear.  Type of Respirations: Regular.  Cough: No cough noted.  Respiratory Support: The patient does not require any respiratory support.  Respiratory Equipment: None.    INTEGUMENTARY  Skin:  Temperature: Warm  Turgor: Normal for age  Moisture: Dry  Color of skin: Normal for Race/Ethnicity  Capillary Refill: Less than 3 seconds  Wounds/Incisions:     Surgical Incision: Right knee incision. Length: 20 centimeter(s) with staples  and sutures. Swelling present.  Drainage: Scant amount of serous drainage.  Odor:  No  Incision Care: Cleaned with ns, terahoney applied and covered with ABD gauze     Surgical Incision: Right lower extremity incision. Length: 17 centimeter(s)  with staples. No signs of infection.  Drainage: Incision without drainage.  Odor:  No  Incision Care: Incision healing  Braden Scale for Predicting Pressure Sore Risk: Sensory Perception:  No  impairment  Moisture: Rarely moist  Activity: Chairfast  Mobility: Slightly limited  Nutrition: Adequate  Friction and Shear: Potential problem  Braden Score: 18  Level of Risk: At risk (15-18)   Assist patient to the bathroom every 2 hours    GASTROINTESTINAL  Abdomen: Soft. Nontender.  Bowel Sounds:  Active bowel sounds audible in all four quadrants.  Date of Last Bowel Movement:   No Problems/Complaints with Bowel Elimination Assessed.    GENITOURINARY  Current Bladder Pattern: Continent  Color:  Yellow Clear   Patient denies problems with urination and/or catheter.    MUSCULOSKELETAL  Upper Body: none  Lower Body: RLE- RIGHT LOWER EXTREMITY 10% WB in extension. NO bending of R knee    Functional Measures    EATING: Eating Score = 6. Patient is modified independent for eating, requiring:  Safety considerations.    GROOMING: Grooming Score = 5. Patient is supervision/set-up for grooming,  requiring: Setting out grooming equipment.  Patient requires the following assistive device(s) No assistive devices were  required.    TOILETING: Toileting Score = 4.  Patient requires minimal assistance for  toileting, such as steadying for balance while cleansing or adjusting clothes.  Patient requires the following assistive device(s):  Grab bar.    BLADDER MANAGEMENT - LEVEL OF ASSIST: Bladder Score = 7. Patient is completely  independent for bladder management. There are no activity limitations.    BLADDER ACCIDENTS THIS SHIFT:  0 . Patient has not had an accident this  assessment and did not require medications or devices.    BOWEL MANAGEMENT - LEVEL OF ASSIST: Bowel Score = 6. Patient is modified  independent for bowel management requiring: Stool softeners    BOWEL ACCIDENTS THIS SHIFT: 0 . Patient has not had an accident, but used a  stool softener.    Education Provided:    Education Provided: Plan of care. Safety issues and interventions. Fall  protocol. Restraint protocol.   Skin/wound care. Signs/symptoms of  infection. Incision care. Prevention of skin  breakdown. Medication. Name and dosage. Administration. Purpose. Side Effects.       Audience: Patient.       Mode: Explanation.  Demonstration.       Response: Applied knowledge.  Verbalized understanding.  Demonstrated skill.    Discharge: Patient is not being discharged at this time.    Long Term Goals: Within 2 weeks, patient will be able verbalize 2/10 on pain  scale on a consistent basis for effective pain management. - Goal Not Met  2 weeks from 10/03/2013  Short Term Goals: Within 2 weeks, patient will be able to  state medications  being taken along with indications, and possible side effects. - Goal Not Met  2 weeks from 10/03/2013    PROGRESS TOWARD GOALS:    PLAN: Nursing Specific Interventions  Medication Management. Pain Management. Skin Management. Wound Management.  Continue with the current Nursing Plan of Care.    TEAM CARE PLAN  Identified problems from team documentation:  Problem: Impaired Mobility  Mobility: Primary Team Goal: Pt will be indep with all bed mobility and  transfers with LRAD, indep with walking at least 73ft iwth LRAD, mod-max A for 4  steps with LRAD and HR/Active    Problem: Impaired Pain Management  Pain Mgmt: Primary Team Goal: Team will collaborate and work efficiently and  effectively in achieving manageable pain levels while participating in acute  rehab./    Problem: Impaired Self-care Mgmt/ADL/IADL  Self Care: Primary Team Goal: Patient will perform toileting transfer and task  with MOD I.  Caregiver will demo safe supervision for bathing and LB dressing  according to family training./Active    Add/Update Problems from this assessment:  No updates at this time.    Please review Integrated Patient View Care Plan Flowsheet for Team identified  Problems, Interventions, and Goals.    Signed by: Dickie La, RN 10/12/2013 7:10:00 PM

## 2013-10-12 NOTE — Rehab Progress Note ([facility]) (Signed)
[name]  MRN: [id number]  Account: [id number]  Session Start: 10/11/2013 12:00:00 AM  Session Stop: 10/11/2013 12:00:00 AM    Rehabilitation Nursing  Inpatient Rehabilitation Shift Assessment    Rehab Diagnosis: Gait Disturbance  Demographics:            Age: 78Y            Gender: Female  Primary Language: english    Date of Onset:  09-05-13  Date of Admission: 10/03/2013 6:43:00 PM    Rehabilitation Precautions Restrictions:   Fall, skin, 10% WB RLE    Patient Report: I have some pain in my right leg  Pain: Patient currently has pain.  Location: right knee and leg  Type: Acute  Quality: Aching.  Pain Scale: Numeric.  Patient reports a pain level of 5 out of 10.  Patient's acceptable level of pain 2 out of 10.   Interferes with sleep.  Pain is alleviated by: medicine  Pain is exacerbated by: movement Patient medicated.  Pain Reassessment:  Response to Pain Intervention: pain decreased to acceptable level  Post Intervention Pain Quality:  Aching.  Patient Reports Post Intervention Pain Level of: 2 out of 10  Pain Acceptable: Yes  Patient/Caregiver Goals:  I want to get better so that I can go home.    NEURO  Orientation/Awareness: Alert and Oriented x4.  Speech: Clear.  Behavior: Cooperative.    MEDICATIONS  IV Access: No IV access.  Dialysis Access: Patient does not have dialysis access.      Elopement Risk Level Assessment Tool  Patient Criteria: Patient is not capable of leaving the unit.  Assessment is not  applicable.      RISK ASSESSMENT FOR FALLS/INJURY    MENTAL STATUS CRITERIA:   0 - None identified.  MENTAL STATUS TOTAL: 0    AGE CRITERIA:   4 - 76 years old and above  AGE TOTAL: 2    ELIMINATION CRITERIA:   3 - Toileting with Assistance.   ELIMINATION TOTAL: 3    HISTORY OF FALLS CRITERIA:   2 - Unknown History.  HISTORY OF FALLS TOTAL: 2    MEDICATIONS CRITERIA:   2 or more High Risk Medications (*see list below)   MEDICATIONS TOTAL: 2    PHYSICAL MOBILITY CRITERIA:   3 - Decreased balance  reaction.   1 - Weakness/impaired physical mobility.   PHYSICAL MOBILITY TOTAL: 4    FALLS RISK ASSESSMENT TOTAL: 13    Patient's Fall Risk: TOTAL SCORE >10: High Risk    Falls Interventions: Clutter removed and clear path to BR.  Call bell, phone, glasses, etc within reach.  Hourly toileting/safety checks between 6am and 10pm, then every 2 hours.  Bed alarm    NUTRITION  Diet:  Type: Regular.  Food Consistency: Regular.  Liquid Consistency: Thin.      CARDIOVASCULAR     Right Lower Extremity  Nail Bed Color: Pink.   right knee/ leg swollen   Left Lower Extremity  Nail Bed Color: Pink.   No edema or redness present.  Pulses:   Apical Pulse: Regular. Strong. Rate is 73 .   Patient does not have a pacemaker.   Patient does not have a defibrillator.    CARDIOPULMONARY  Lung Sounds:   Upper lobes. Clear.   Lower lobes. Clear.  Type of Respirations: Regular.  Cough: No cough noted.  Respiratory Support: The patient does not require any respiratory support.  Respiratory Equipment: None. 94% room air  INTEGUMENTARY  Skin:  Temperature: Warm  Turgor: Normal for age  Moisture: Dry  Color of skin: Normal for Race/Ethnicity  Capillary Refill: Less than 3 seconds  Wounds/Incisions:     Wound/incision not assessed this shift.   R knee area has some incisions, not assessed because dressing was changed by  day sift and dressing was intact over knee area.  Braden Scale for Predicting Pressure Sore Risk: Sensory Perception: No  impairment  Moisture: Rarely moist  Activity: Chairfast  Mobility: Slightly limited  Nutrition: Adequate  Friction and Shear: No apparent problem  Braden Score: 19  Level of Risk: No risk (19-23). Will reassess every shift.    GASTROINTESTINAL  Abdomen: Soft. Nontender.  Bowel Sounds:  Active bowel sounds audible in all four quadrants.  Date of Last Bowel Movement:  10/10/12   No Problems/Complaints with Bowel Elimination Assessed.    GENITOURINARY  Current Bladder Pattern: Continent  Color:  Yellow   Patient  denies problems with urination and/or catheter.    MUSCULOSKELETAL  Upper Body: none  Lower Body: RLE- RIGHT LOWER EXTREMITY 10% WB in extension. NO bending of R knee    Functional Measures      TOILETING: Toileting Score = 5.  Patient is supervision/set-up for toileting,  requiring: Stand by assistance.  Patient requires the following assistive device(s):  Grab bar.    BLADDER MANAGEMENT - LEVEL OF ASSIST: Bladder Score = 7. Patient is completely  independent for bladder management. There are no activity limitations.    BLADDER ACCIDENTS THIS SHIFT:  0 . Patient has not had an accident this  assessment and did not require medications or devices.    BOWEL MANAGEMENT - LEVEL OF ASSIST: Bowel Score = 6.  Patient is modified  independent for bowel management.  Patient did not have bowel movement.  Medication/intervention was provided.    BOWEL ACCIDENTS THIS SHIFT: 0 . Patient has not had an accident, but used a  stool softener.    TRANSFER TOILET: Toilet Transfer Score = 5.  Patient is supervision/set-up for  transferring to and from the toilet/commode, requiring: Wheelchair management  (brakes, footrests, armrests).  Set up (positioning equipment, lock brakes and/or adjust foot rests).   place stool for pt.  Patient requires the following assistive device(s):  Grab bars.    Education Provided:    Education Provided: Safety issues and interventions. Fall protocol.       Audience: Patient.       Mode: Explanation.       Response: Applied knowledge.    Education Provided: Medication. Name and dosage. Purpose.       Audience: Patient.       Mode: Explanation.       Response: Verbalized understanding.    Discharge: Patient is not being discharged at this time.    Long Term Goals: Within 2 weeks, patient will be able verbalize 2/10 on pain  scale on a consistent basis for effective pain management. - Goal Not Met  2 weeks from 10/03/2013  Short Term Goals: Within 2 weeks, patient will be able to state medications  being  taken along with indications, and possible side effects. - Goal Not Met  2 weeks from 10/03/2013    PROGRESS TOWARD GOALS: Pt had 2/10 pain after medications.    PLAN: Nursing Specific Interventions  Medication Management. Pain Management. Skin Management. Wound Management.  Continue with the current Nursing Plan of Care.    TEAM CARE PLAN  Identified problems from team  documentation:  Problem: Impaired Mobility  Mobility: Primary Team Goal: Pt will be indep with all bed mobility and  transfers with LRAD, indep with walking at least 71ft iwth LRAD, mod-max A for 4  steps with LRAD and HR/Active    Problem: Impaired Pain Management  Pain Mgmt: Primary Team Goal: Team will collaborate and work efficiently and  effectively in achieving manageable pain levels while participating in acute  rehab./    Problem: Impaired Self-care Mgmt/ADL/IADL  Self Care: Primary Team Goal: Patient will perform toileting transfer and task  with MOD I.  Caregiver will demo safe supervision for bathing and LB dressing  according to family training./Active    Add/Update Problems from this assessment:  No updates at this time.    Please review Integrated Patient View Care Plan Flowsheet for Team identified  Problems, Interventions, and Goals.    Signed by: Sherlyn Lick, RN 10/11/2013 9:10:00 PM

## 2013-10-12 NOTE — Progress Notes (Signed)
[facility] Physiatry Attending Brief Note  [x] Discussed with nurse.  [x] No new events    Subjective:  Feels well and is without complaints. Pain is controlled with current meds. No chest pain. No shortness of breath. No other complaints.  Slept well last night.  Knee pain 2-3/10 this morning.    Objective:  Vitals: BP 141/59  Pulse 70  Temp 98.2 F (36.8 C) (Oral)  Resp 18  SpO2 95%    Awake and Alert, No acute distress, Resting comfortably, No respiratory distress and No calf tenderness    New labs   Results     ** No Results found for the last 24 hours. **            Current Medications:  Current Facility-Administered Medications   Medication Dose Route Frequency   . amLODIPine  10 mg Oral Daily   . bisoprolol  5 mg Oral Daily   . ciprofloxacin  750 mg Oral Q12H SCH   . cloNIDine  0.1 mg Oral Daily   . docusate sodium  100 mg Oral Q12H SCH   . ferrous sulfate 324 mg-ascorbic acid 500 mg combo dose   Oral Daily   . gabapentin  300 mg Oral Q12H SCH   . hydrochlorothiazide  25 mg Oral Daily   . lidocaine  2 patch Transdermal Q24H   . multivitamin  1 tablet Oral Daily   . polycarbophil  1,250 mg Oral Daily   . potassium chloride  10 mEq Oral Daily   . Senna  17.2 mg Oral QHS   . sulfamethoxazole-trimethoprim  1 tablet Oral Q12H SCH   . tiotropium  18 mcg Inhalation QAM   . traMADol  50 mg Oral Q6H SCH       Assessment/Plan: 78 y.o. female with complicated TKA and revisions    Patient Active Problem List   Diagnosis   . Total knee replacement status   . Low back pain   . Chronic obstructive pulmonary disease   . Hypertension   . Debility following multiple R knee revisions       Continue comprehensive intensive inpatient rehab program. Medically stable.  Continue current management.  Pain control  Bactrim for UTI  Continue cipro  Wound care  Optimize nutrition      Danie Chandler, MD  PM&R

## 2013-10-12 NOTE — Rehab Progress Note ([facility]) (Signed)
[name]  MRN: [id number]  Account: [id number]  Session Start: 10/12/2013 12:00:00 AM  Session Stop: 10/12/2013 12:00:00 AM    SEVERE SEPSIS SCREEN  INFECTION:  Patient has documented infection. Is on antibiotic therapy (not  prophylaxis).    SYSTEMATIC IMFLAMMATORY RESPONSE SYNDROME (SIRS):  Patient  has no indication of  systematic inflammatory response syndrome (SIRS). Negative Sepsis Screen.  If you are unable to assess a system's dysfunction because you do not have labs,  or the labs you have are not current (within 24 hours), call physician and  request and order for the lab tests needed.    Signed by: Dickie La, RN 10/12/2013 9:55:00 AM

## 2013-10-12 NOTE — Rehab Progress Note ([facility]) (Signed)
NAMEEMBRY MANRIQUE  MRN: [id number]  Account: [id number]  Session Start: 10/12/2013 12:00:00 AM  Session Stop: 10/12/2013 12:00:00 AM    SEVERE SEPSIS SCREEN  INFECTION:  Patient has documented infection. Is on antibiotic therapy (not  prophylaxis).    SYSTEMATIC IMFLAMMATORY RESPONSE SYNDROME (SIRS):  Patient  has no indication of  systematic inflammatory response syndrome (SIRS). Negative Sepsis Screen.  If you are unable to assess a system's dysfunction because you do not have labs,  or the labs you have are not current (within 24 hours), call physician and  request and order for the lab tests needed.    Signed by: Sherlyn Lick, RN 10/12/2013 9:00:00 PM

## 2013-10-13 LAB — CBC AND DIFFERENTIAL
Basophils Absolute Automated: 0.02 10*3/uL (ref 0.00–0.20)
Basophils Automated: 0 %
Eosinophils Absolute Automated: 0.21 10*3/uL (ref 0.00–0.70)
Eosinophils Automated: 3 %
Hematocrit: 29.3 % — ABNORMAL LOW (ref 37.0–47.0)
Hgb: 9.2 g/dL — ABNORMAL LOW (ref 12.0–16.0)
Immature Granulocytes Absolute: 0.01 10*3/uL
Immature Granulocytes: 0 %
Lymphocytes Absolute Automated: 1.85 10*3/uL (ref 0.50–4.40)
Lymphocytes Automated: 28 %
MCH: 26 pg — ABNORMAL LOW (ref 28.0–32.0)
MCHC: 31.4 g/dL — ABNORMAL LOW (ref 32.0–36.0)
MCV: 82.8 fL (ref 80.0–100.0)
MPV: 9.8 fL (ref 9.4–12.3)
Monocytes Absolute Automated: 0.67 10*3/uL (ref 0.00–1.20)
Monocytes: 10 %
Neutrophils Absolute: 3.78 10*3/uL (ref 1.80–8.10)
Neutrophils: 58 %
Nucleated RBC: 0 (ref 0–1)
Platelets: 231 10*3/uL (ref 140–400)
RBC: 3.54 10*6/uL — ABNORMAL LOW (ref 4.20–5.40)
RDW: 15 % (ref 12–15)
WBC: 6.53 10*3/uL (ref 3.50–10.80)

## 2013-10-13 LAB — COMPREHENSIVE METABOLIC PANEL
ALT: 9 U/L (ref 0–55)
AST (SGOT): 15 U/L (ref 5–34)
Albumin/Globulin Ratio: 0.9 (ref 0.9–2.2)
Albumin: 2.7 g/dL — ABNORMAL LOW (ref 3.5–5.0)
Alkaline Phosphatase: 64 U/L (ref 40–150)
BUN: 15 mg/dL (ref 7–21)
Bilirubin, Total: 0.6 mg/dL (ref 0.2–1.2)
CO2: 27 mEq/L (ref 22–29)
Calcium: 8.8 mg/dL (ref 7.9–10.6)
Chloride: 106 mEq/L (ref 98–107)
Creatinine: 1 mg/dL (ref 0.6–1.0)
Globulin: 3 g/dL (ref 2.0–3.6)
Glucose: 96 mg/dL (ref 70–100)
Potassium: 3.8 mEq/L (ref 3.5–5.1)
Protein, Total: 5.7 g/dL — ABNORMAL LOW (ref 6.0–8.3)
Sodium: 141 mEq/L (ref 136–145)

## 2013-10-13 LAB — SEDIMENTATION RATE: Sed Rate: 66 — ABNORMAL HIGH (ref 0–20)

## 2013-10-13 LAB — GFR: EGFR: >60

## 2013-10-13 LAB — C-REACTIVE PROTEIN: C-Reactive Protein: 2.5 mg/dL — ABNORMAL HIGH (ref 0.0–0.5)

## 2013-10-13 MED ORDER — CIPROFLOXACIN HCL 250 MG PO TABS
750.0000 mg | ORAL_TABLET | Freq: Two times a day (BID) | ORAL | Status: DC
Start: 2013-10-13 — End: 2013-10-14
  Administered 2013-10-13 – 2013-10-14 (×2): 750 mg via ORAL
  Filled 2013-10-13 (×2): qty 3

## 2013-10-13 NOTE — Rehab Progress Note ([facility]) (Signed)
NAMECLORINE SWING  MRN: [id number]  Account: [id number]  Session Start: 10/13/2013 12:00:00 AM  Session Stop: 10/13/2013 12:00:00 AM    SEVERE SEPSIS SCREEN  INFECTION:  Patient has documented infection. Is on antibiotic therapy (not  prophylaxis).    SYSTEMATIC IMFLAMMATORY RESPONSE SYNDROME (SIRS):  Patient  has no indication of  systematic inflammatory response syndrome (SIRS). Negative Sepsis Screen.  If you are unable to assess a system's dysfunction because you do not have labs,  or the labs you have are not current (within 24 hours), call physician and  request and order for the lab tests needed.    Signed by: Doretha Imus, RN 10/13/2013 9:15:00 AM

## 2013-10-13 NOTE — Rehab Progress Note ([facility]) (Signed)
[name]  MRN: [id number]  Account: [id number]  Session Start: 10/13/2013 3:00:00 PM  Session Stop: 10/13/2013 4:00:00 PM    Physical Therapy  Inpatient Rehabilitation Progress Note - Brief    Rehab Diagnosis: Gait Disturbance  Demographics:            Age: 26Y            Gender: Female  Rehabilitation Precautions/Restrictions:   Fall, skin, 10% WB RLE    SUBJECTIVE  Pain: Pt reports 3/10 pain in R knee during session, pain is tolerable and  controlled by medication.    OBJECTIVE  General Observations: PT student present during session with pt's permission.    Interventions:       Therapeutic Activities:  Pt practices gait training 21', 15', and 57' with  S using RW, with PT providing w/c follow due to decreased gait distances.  Discussed and demonstrated several techniques for transfer to car with pt  verbalizing understanding.  Pt also practices standing balance activities on  firm surface with use of RW for support as needed.  Following session she is  left sitting up in w/c at bedside with call bell in reach and needs met, hand  off to nursing.    ASSESSMENT  Pt is able to ambulate longer distances, however gait remains limited by general  weakness and B shoulder discomfort.    PLAN  Continued Physical Therapy is recommended.  Recommended Frequency/Duration/Intensity: 1-2 hrs/day, 5-6 days/wk for 2 weeks  from evaluation on 10/04/13  Activities Contributing Toward Care Plan: Bed mobility training, transfer  training, balance training, gait training, stair training, therapeutic  strengthening exercises, therapeutic activities, ROM exercises, endurance  activities, family/caregiver training, instruction in home exercise program,  education for home modifications, procurement of DME.    3 Hour Rule Minutes: 60 minutes of PT treatment this session count towards  intensity and duration of therapy requirement. Patient was seen for the full  scheduled time of PT treatment this session.    Signed by: Jaquita Rector,  PT 10/13/2013 4:00:00 PM

## 2013-10-13 NOTE — Rehab Progress Note ([facility]) (Signed)
[name]  MRN: [id number]  Account: [id number]  Session Start: 10/13/2013 12:00:00 AM  Session Stop: 10/13/2013 12:00:00 AM    Rehabilitation Nursing  Inpatient Rehabilitation Shift Assessment    Rehab Diagnosis: Gait Disturbance  Demographics:            Age: 78Y            Gender: Female  Primary Language: english    Date of Onset:  09-05-13  Date of Admission: 10/03/2013 6:43:00 PM    Rehabilitation Precautions Restrictions:   Fall, skin, 10% WB RLE    Patient Report: 'I slept good"  Pain: Patient currently has pain.  Location: Right knee  Type: Acute  Quality: Aching.  Pain Scale: Numeric.  Patient reports a pain level of 6 out of 10.  Patient's acceptable level of pain 0 out of 10.   Interferes with physical activity.  Pain is alleviated by: Oxycontin  Pain is exacerbated by: Therapy Patient medicated. Oxycontin 10mg   Pain Reassessment: Pain was not reassessed as no pain was reported.  Patient/Caregiver Goals:  I want to get better so that I can go home.    NEURO  Orientation/Awareness: Alert and Oriented x4.  Speech: Clear.  Behavior: Cooperative.    MEDICATIONS  IV Access: No IV access.  Dialysis Access: Patient does not have dialysis access.      Elopement Risk Level Assessment Tool  Patient Criteria: Patient is not capable of leaving the unit.  Assessment is not  applicable.      RISK ASSESSMENT FOR FALLS/INJURY    MENTAL STATUS CRITERIA:   0 - None identified.  MENTAL STATUS TOTAL: 0    AGE CRITERIA:   78 - 69-18 years old  AGE TOTAL: 1    ELIMINATION CRITERIA:   3 - Toileting with Assistance.   ELIMINATION TOTAL: 3    HISTORY OF FALLS CRITERIA:   2 - Unknown History.  HISTORY OF FALLS TOTAL: 2    MEDICATIONS CRITERIA:   2 or more High Risk Medications (*see list below)   MEDICATIONS TOTAL: 2    PHYSICAL MOBILITY CRITERIA:   3 - Decreased balance reaction.   PHYSICAL MOBILITY TOTAL: 3    FALLS RISK ASSESSMENT TOTAL: 11    Patient's Fall Risk: TOTAL SCORE >10: High Risk    Falls Interventions: Clutter  removed and clear path to BR.  Call bell, phone, glasses, etc within reach.  Hourly toileting/safety checks between 6am and 10pm, then every 2 hours.  Yellow "high risk" patient identification in place: wrist band, socks, chart  sticker, door sign.   Hourly rounding    NUTRITION  Diet:  Type: Regular.  Food Consistency: Regular.  Liquid Consistency: Thin.      CARDIOVASCULAR     Right Lower Extremity  Nail Bed Color: Pink.   Right knee area swollen due to fall and surgical procedure.   Left Lower Extremity  Nail Bed Color: Pink.   No edema or redness present.  Pulses:   Apical Pulse: Regular. Strong. Rate is 70 .   Patient does not have a pacemaker.   Patient does not have a defibrillator.    CARDIOPULMONARY  Lung Sounds:   Upper lobes. Clear.   Lower lobes. Clear.  Type of Respirations: Regular. Regular.  Cough: No cough noted.  Respiratory Support: The patient does not require any respiratory support.  Respiratory Equipment: None. 97% R/A    INTEGUMENTARY  Skin:  Temperature: Cool  Turgor: Normal for age  Moisture: Dry  Color of skin: Normal for Race/Ethnicity  Capillary Refill: Less than 3 seconds  Wounds/Incisions:     Surgical Incision: Right knee incision. Length: 20 centimeter(s) with sutures.  No signs of infection.  Drainage: Incision without drainage.  Odor:  No  Incision Care: Cleaned with ns, terahoney applied and covered with ABD gauze     Surgical Incision: Right posterior knee incision Length: 118 centimeter(s) with  staples. No signs of infection.  Drainage: Incision without drainage.  Odor:  No  Incision Care: Incision healing  Braden Scale for Predicting Pressure Sore Risk: Sensory Perception: No  impairment  Moisture: Rarely moist  Activity: Chairfast  Mobility: Slightly limited  Nutrition: Adequate  Friction and Shear: Potential problem  Braden Score: 18  Level of Risk: At risk (15-18)   Assist patient to the bathroom every 2 hours    GASTROINTESTINAL  Abdomen: Soft. Nontender.  Bowel Sounds:   Active bowel sounds audible in all four quadrants.  Date of Last Bowel Movement:  10/13/13   No Problems/Complaints with Bowel Elimination Assessed.    GENITOURINARY  Current Bladder Pattern: Continent  Color:   Patient denies problems with urination and/or catheter.    MUSCULOSKELETAL  Upper Body: none  Lower Body: RLE- RIGHT LOWER EXTREMITY 10% WB in extension. NO bending of R knee    Functional Measures    EATING: Eating Score = 7. Patient is completely independent for eating.  There  are no activity limitations.    BLADDER MANAGEMENT - LEVEL OF ASSIST: Bladder Score = 7. Patient is completely  independent for bladder management. There are no activity limitations.    BLADDER ACCIDENTS THIS SHIFT:  0 . Patient has not had an accident this  assessment and did not require medications or devices.    BOWEL MANAGEMENT - LEVEL OF ASSIST: Bowel Score = 6.  Patient is modified  independent for bowel management.  Patient did not have bowel movement.  Medication/intervention was provided.    BOWEL ACCIDENTS THIS SHIFT: 0 . Patient has not had an accident, but used a  stool softener.    TRANSFERS BED/CHAIR/WHEELCHAIR: Bed/chair/wheelchair Transfer Score = 5.  Patient is supervision/set-up for transferring to and from the  bed/chair/wheelchair, requiring: Verbal cuing, prompting, or instructing.  Set up (positioning equipment, lock brakes and/or adjust foot rest).   Assist with right leg  Patient requires the following assistive device(s): No assistive devices were  required.    Education Provided:    Education Provided: Medication. Purpose. Side Effects.       Audience: Patient.       Mode: Explanation.       Response: Verbalized understanding.    Discharge: Patient is not being discharged at this time.    Long Term Goals: Within 2 weeks, patient will be able verbalize 2/10 on pain  scale on a consistent basis for effective pain management. - Goal Not Met  2 weeks from 10/03/2013  Short Term Goals: Within 2 weeks, patient will  be able to state medications  being taken along with indications, and possible side effects. - Goal Not Met  2 weeks from 10/03/2013    PROGRESS TOWARD GOALS: Pt knows  purpose for all her medications continue to  require teaching on the side effects.    PLAN: Nursing Specific Interventions  Medication Management. Pain Management. Skin Management. Wound Management.  Continue with the current Nursing Plan of Care.    TEAM CARE PLAN  Identified problems from team documentation:  Problem: Impaired Mobility  Mobility: Primary Team Goal: Pt will be indep with all bed mobility and  transfers with LRAD, indep with walking at least 61ft iwth LRAD, mod-max A for 4  steps with LRAD and HR/Active    Problem: Impaired Pain Management  Pain Mgmt: Primary Team Goal: Team will collaborate and work efficiently and  effectively in achieving manageable pain levels while participating in acute  rehab./    Problem: Impaired Self-care Mgmt/ADL/IADL  Self Care: Primary Team Goal: Patient will perform toileting transfer and task  with MOD I.  Caregiver will demo safe supervision for bathing and LB dressing  according to family training./Active    Add/Update Problems from this assessment:  No updates at this time.    Please review Integrated Patient View Care Plan Flowsheet for Team identified  Problems, Interventions, and Goals.    Signed by: Doretha Imus, RN 10/13/2013 3:20:00 PM

## 2013-10-13 NOTE — Rehab Progress Note ([facility]) (Signed)
[name]  MRN: [id number]  Account: [id number]  Session Start: 10/13/2013 12:00:00 AM  Session Stop: 10/13/2013 12:00:00 AM    SEVERE SEPSIS SCREEN  INFECTION:  Patient has documented infection. Is on antibiotic therapy (not  prophylaxis). Is on antibiotic therapy (not prophylaxis).    SYSTEMATIC IMFLAMMATORY RESPONSE SYNDROME (SIRS):  Patient  has no indication of  systematic inflammatory response syndrome (SIRS). Negative Sepsis Screen.  If you are unable to assess a system's dysfunction because you do not have labs,  or the labs you have are not current (within 24 hours), call physician and  request and order for the lab tests needed.    Signed by: Gordy Clement, RN3 10/13/2013 9:30:00 PM

## 2013-10-13 NOTE — Rehab Progress Note ([facility]) (Signed)
NAMEANALYSE ANGST  MRN: [id number]  Account: [id number]  Session Start: 10/12/2013 12:00:00 AM  Session Stop: 10/12/2013 12:00:00 AM    Rehabilitation Nursing  Inpatient Rehabilitation Shift Assessment    Rehab Diagnosis: Gait Disturbance  Demographics:            Age: 78Y            Gender: Female  Primary Language: english    Date of Onset:  09-05-13  Date of Admission: 10/03/2013 6:43:00 PM    Rehabilitation Precautions Restrictions:   Fall, skin, 10% WB RLE    Patient Report: I'm ok.  Pain: Patient currently without complaints of pain.  Pain Reassessment: Pain was not reassessed as no pain was reported.  Patient/Caregiver Goals:  I want to get better so that I can go home.    NEURO  Orientation/Awareness: Alert and Oriented x3.  Speech: Clear.  Behavior: Cooperative.    MEDICATIONS  IV Access: No IV access.  Dialysis Access: Patient does not have dialysis access.      Elopement Risk Level Assessment Tool  Patient Criteria: Patient is not capable of leaving the unit.  Assessment is not  applicable.      RISK ASSESSMENT FOR FALLS/INJURY    MENTAL STATUS CRITERIA:   0 - None identified.  MENTAL STATUS TOTAL: 0    AGE CRITERIA:   78 - 6 years old and above  AGE TOTAL: 2    ELIMINATION CRITERIA:   3 - Toileting with Assistance.   ELIMINATION TOTAL: 3    HISTORY OF FALLS CRITERIA:   2 - Unknown History.  HISTORY OF FALLS TOTAL: 2    MEDICATIONS CRITERIA:   2 or more High Risk Medications (*see list below)   MEDICATIONS TOTAL: 2    PHYSICAL MOBILITY CRITERIA:   1 - Weakness/impaired physical mobility.   3 - Decreased balance reaction.   PHYSICAL MOBILITY TOTAL: 4    FALLS RISK ASSESSMENT TOTAL: 13    Patient's Fall Risk: TOTAL SCORE >10: High Risk    Falls Interventions: Clutter removed and clear path to BR.  Call bell, phone, glasses, etc within reach.  Bed alarm    NUTRITION  Diet:  Type: Regular.  Food Consistency: Regular.  Liquid Consistency: Thin.      CARDIOVASCULAR     Left Lower Extremity  Nail Bed Color: Pink.   No  edema or redness present.   Right Lower Extremity  Nail Bed Color: Pink.   Right leg has +1 edema.  Pulses:   Apical Pulse: Regular. Strong. Rate is 73 .   Patient does not have a pacemaker.   Patient does not have a defibrillator.    CARDIOPULMONARY  Lung Sounds:   Upper lobes. Clear.   Lower lobes. Clear.  Type of Respirations: Regular.  Cough: No cough noted.  Respiratory Support: The patient does not require any respiratory support.  Respiratory Equipment: None. 94% roomair    INTEGUMENTARY  Skin:  Temperature: Warm  Turgor: Normal for age  Moisture: Dry  Color of skin: Normal for Race/Ethnicity  Capillary Refill: Less than 3 seconds  Wounds/Incisions:  Surgical Incision: Right lower extremity incision. Length: 17  centimeter(s) with staples. No signs of infection.  Drainage: Incision without drainage.  Odor:  No  Incision Care: Incision healing       Drain site small opening on right medial knee area. Moderate amount of seous  drainage, with a spot of serosanguinous drainage right over the drain site. NS  wash and patted dry, new foam restore applied and new Kerlix wrap applied.     Wound/incision not assessed this shift.   anterioror right knee incision was not assessed, because dressing is only to be  changed daily, and was changed during day shift.  Braden Scale for Predicting Pressure Sore Risk: Sensory Perception: No  impairment  Moisture: Rarely moist  Activity: Chairfast  Mobility: Slightly limited  Nutrition: Adequate  Friction and Shear: Potential problem  Braden Score: 18  Level of Risk: At risk (15-18)    GASTROINTESTINAL  Abdomen: Soft. Nontender.  Bowel Sounds:  Active bowel sounds audible in all four quadrants.  Date of Last Bowel Movement:  10/12/13   No Problems/Complaints with Bowel Elimination Assessed.    GENITOURINARY  Current Bladder Pattern: Continent  Color:  Yellow   Patient denies problems with urination and/or catheter.    MUSCULOSKELETAL  Upper Body: none  Lower Body: RLE- RIGHT LOWER  EXTREMITY 10% WB in extension. NO bending of R knee    Functional Measures      BLADDER MANAGEMENT - LEVEL OF ASSIST: Bladder Score = 7. Patient is completely  independent for bladder management. There are no activity limitations.    BLADDER ACCIDENTS THIS SHIFT:  0 . Patient has not had an accident this  assessment and did not require medications or devices.    BOWEL MANAGEMENT - LEVEL OF ASSIST: Bowel Score = 6.  Patient is modified  independent for bowel management.  Patient did not have bowel movement.  Medication/intervention was provided.    BOWEL ACCIDENTS THIS SHIFT: 0 . Patient has not had an accident, but used a  stool softener.    Education Provided:    Education Provided: Safety issues and interventions. Fall protocol.       Audience: Patient.       Mode: Explanation.       Response: Needs reinforcement.    Discharge: Patient is not being discharged at this time.    Long Term Goals: Within 2 weeks, patient will be able verbalize 2/10 on pain  scale on a consistent basis for effective pain management. - Goal Not Met  2 weeks from 10/03/2013  Short Term Goals: Within 2 weeks, patient will be able to state medications  being taken along with indications, and possible side effects. - Goal Not Met  2 weeks from 10/03/2013    PROGRESS TOWARD GOALS: Pt did not have pain tonight.    PLAN: Nursing Specific Interventions  Medication Management. Pain Management. Skin Management. Wound Management.  Continue with the current Nursing Plan of Care.    TEAM CARE PLAN  Identified problems from team documentation:  Problem: Impaired Mobility  Mobility: Primary Team Goal: Pt will be indep with all bed mobility and  transfers with LRAD, indep with walking at least 34ft iwth LRAD, mod-max A for 4  steps with LRAD and HR/Active    Problem: Impaired Pain Management  Pain Mgmt: Primary Team Goal: Team will collaborate and work efficiently and  effectively in achieving manageable pain levels while participating in  acute  rehab./    Problem: Impaired Self-care Mgmt/ADL/IADL  Self Care: Primary Team Goal: Patient will perform toileting transfer and task  with MOD I.  Caregiver will demo safe supervision for bathing and LB dressing  according to family training./Active    Add/Update Problems from this assessment:  No updates at this time.    Please review Integrated Patient View Care Plan Flowsheet for Team identified  Problems, Interventions, and Goals.    Signed by: Sherlyn Lick, RN 10/12/2013 9:00:00 PM

## 2013-10-13 NOTE — Rehab Progress Note ([facility]) (Signed)
[name]  MRN: [id number]  Account: [id number]  Session Start: 10/13/2013 12:00:00 AM  Session Stop: 10/13/2013 12:00:00 AM    Nutrition  Inpatient Rehabilitation Follow Up    Rehab Diagnosis: Gait Disturbance  Demographics:            Age: 31Y            Gender: Female    Rehabilitation Precautions/Restrictions:   Fall, skin, 10% WB RLE  Medications and Allergies: Significant rehabilitation considerations:   NKA    SUBJECTIVE  Patient Reports: Almost all the meals have carrots and green peas. I don't like  those vegetables. I like cooard greens, broccoli, and sting beans. Whenever I  eat, I am nauseous.  Patient/Caregiver Goals:  I want to get better so that I can go home.    Current Medical Nutrition Therapy: Diet: Regular.   diet with thin liquids. Oral Supplements: Boost Breeze TID    % of Meals Consumed:  50 %  Labs Noted:   Albumin: 2.7  (3.5-5.0)  Current Problems/GI Symptoms: Nausea.    Assessment of Nutrition Status:  Nutritional Risk Level:  Moderate  Nutrition Diagnosis:  No nutrition diagnosis  Current Nutrition Intake: Adequate.  Current Nutrition Status: Adequate.    Interventions:   Monitor Progress.    Education Provided:  No education provided this session.    ASSESSMENT    Assessment of Nutritional Status: 50% po intake; food preference noted; continue  nutrition plan.    Progress Towards Goals: SHORT TERM GOAL REVIEW:       1. Pt makes menu selections to satisfy needs - Met  Time frame to achieve short term goal(s):  1 visit   LONG TERM GOAL REVIEW:       1. Pt maintains 50% or more po intake - Met  Time frame to achieve long term goal(s):  2 weeks    PLAN  Recommendations for Follow-up: F/U 11/03/13    Care Plan  Identified problems from team documentation:  Problem: Impaired Mobility  Mobility: Primary Team Goal: Pt will be indep with all bed mobility and  transfers with LRAD, indep with walking at least 54ft iwth LRAD, mod-max A for 4  steps with LRAD and HR/Active    Problem: Impaired Pain  Management  Pain Mgmt: Primary Team Goal: Team will collaborate and work efficiently and  effectively in achieving manageable pain levels while participating in acute  rehab./    Problem: Impaired Self-care Mgmt/ADL/IADL  Self Care: Primary Team Goal: Patient will perform toileting transfer and task  with MOD I.  Caregiver will demo safe supervision for bathing and LB dressing  according to family training./Active    Add/Update Problems from this Treatment:  No updates at this time.    Please review Integrated Patient View Care Plan Flowsheet for Team identified  Problems, Interventions, and Goals.    Signed by: Lurline Hare, M.S., RD 10/13/2013 12:25:00 PM

## 2013-10-13 NOTE — Rehab Progress Note ([facility]) (Signed)
[name]  MRN: [id number]  Account: [id number]  Session Start: 10/13/2013 10:00:00 AM  Session Stop: 10/13/2013 11:00:00 AM    Physical Therapy  Inpatient Rehabilitation Progress Note - Brief    Rehab Diagnosis: Gait Disturbance  Demographics:            Age: 55Y            Gender: Female  Rehabilitation Precautions/Restrictions:   Fall, skin, 10% WB RLE    SUBJECTIVE  Pain: Patient currently has pain.  Patient reports a pain level of 3 out of 10. Pt medicated prior to session,  reports pain is currently tolerable.    OBJECTIVE  General Observations: PT student present during session with pt's permission.    Interventions:       Therapeutic Activities:  Pt practices w/c mobility over level surfaces  indoors and is able to negotiate through relatively small spaces with little  overt difficulty.  She ambulates 15' in parallel bars with SBA .  Pt  participates in standing and supine ther ex's for increased strength and  endurance.  Following session she is left sitting up in w/c with call bell inr  each and needs met, hand off to nursing.    ASSESSMENT  Pt demos improved lateral transfers however gait remains limited by UE weakness  and pt report of pain in B shoulders due to previous rotator cuff injuries.    PLAN  Continued Physical Therapy is recommended.  Recommended Frequency/Duration/Intensity: 1-2 hrs/day, 5-6 days/wk for 2 weeks  from evaluation on 10/04/13  Activities Contributing Toward Care Plan: Bed mobility training, transfer  training, balance training, gait training, stair training, therapeutic  strengthening exercises, therapeutic activities, ROM exercises, endurance  activities, family/caregiver training, instruction in home exercise program,  education for home modifications, procurement of DME.    3 Hour Rule Minutes: 60 minutes of PT treatment this session count towards  intensity and duration of therapy requirement. Patient was seen for the full  scheduled time of PT treatment this  session.    Signed by: Jaquita Rector, PT 10/13/2013 11:00:00 AM

## 2013-10-13 NOTE — Rehab Progress Note ([facility]) (Signed)
[name]  MRN: [id number]  Account: [id number]  Session Start: 10/13/2013 11:00:00 AM  Session Stop: 10/13/2013 12:00:00 PM    Occupational Therapy  Inpatient Rehabilitation Progress Note - Brief    Rehab Diagnosis: Gait Disturbance  Demographics:            Age: 25Y            Gender: Female  Rehabilitation Precautions/Restrictions:   Fall, skin, 10% WB RLE    SUBJECTIVE  Patient Report: " I actually slept well last night"  Pain: Patient currently has pain.  Patient reports a pain level of 3 out of 10. Patient medicated.    OBJECTIVE  General Observation: Patient upright in w/c upon arrival, agree to participate  in OT session    Interventions:       Therapeutic Exercise:  Heat to bilateral shoulders x approx 5 min prior to  UE exercises to decrease pain and increase ROM. Patient performed 3 x 10  horizontal abduction with yellow band, 3 x 10 elbow extension, 3 x 10 elbow  flexion. Patient performed scapular AROM in all planes.          ASSESSMENT  Patient participated well with therapy, continue to be limited by pain in right  LE and bilateral shoulders. Patient encouraged to continue AROM with bilateral  UE. Patient would benefit from continued skilled therapy to maximize  independence.    PLAN  Continued Occupational Therapy is recommended.  Recommended Frequency/Duration/Intensity: 1-2 hours/day, 5-7days/week, for 14  days s/p eval on 10/05/13.  Continued Activities Contributing Toward Care Plan: Therapeutic exercise,  therapeutic activity, ADL/transfer training, balance training, endurance  training, pt/family education    3 Hour Rule Minutes: 60 minutes of OT treatment this session count towards  intensity and duration of therapy requirement. Patient was seen for the full  scheduled time of OT treatment this session.    Signed by: Toney Reil, OTR/L 10/13/2013 12:00:00 PM

## 2013-10-13 NOTE — Progress Notes (Signed)
[facility] Attending Face to Face Progress Note    Date Time: 10/13/2013 4:12 PM  Patient Name: [name], [name]    Admission date:  10/03/2013    Functional Status/Update:     [x] I I reviewed patient's therapy notes to assess functional status and ongoing need for therapies.   Of note:  Pt demo I with bed mobility from a flat surface   without the use of bed rails. Pt demo S for toileting and clothing management.   Pt self-propel w/c for 150' with increased time. Gait training with RW for 70'   10' then 12' with w/c follow and CGA. Pt perform transfers from w/c to mat with S.     []  Patient discussed at length today at inter-disciplinary team rounds-conference ;report is pending in [facility]  Subjective and interval development:   Patient says pain at the knee is controlled at 2-3/10; she had BM yesterday  Review of Systems:   A comprehensive review of systems was: No fevers, chills, nausea, vomiting, chest pain, shortness of breath, cough, headache, double vision    Objective:   Vitals:  Filed Vitals:    10/11/13 1945 10/12/13 0518 10/12/13 2038 10/13/13 0614   BP: 141/73 141/59 124/56 144/57   Pulse: 73 70 73 70   Temp: 96.6 F (35.9 C) 98.2 F (36.8 C) 97.5 F (36.4 C) 97.9 F (36.6 C)   TempSrc:       Resp: 18 18 18 17    SpO2: 94% 95% 94% 97%     Physical Examination:  General: seen in therapy  CV:+S1S2 Heart rate and rhythm are regular.   Pulm:Lungs are clear to auscultation bilaterally.   ZOX:WRUE. Non-tender. Normoactive bowel sounds.   Skin:  Healing R knee incision with serosang drainage from the lower end of the incision , resolving edema  Neuro : alert,  oriented  .Sensation to LT is intact.Normal muscle tone and bulk  MMS:MMT- grossly 5/5 except R KF/KE- NT due to immobilization  Extremities: no calf tenderness  New Labs:   No results found for this basename: GLUCOSEWHOLE:10 in the last 24 hours      Lab 10/13/13 0559 10/10/13 0635   WBC 6.53 8.12   HGB 9.2* 9.7*   HCT 29.3* 30.7*   PLT 231 267           Lab 10/13/13 0559 10/10/13 0635   NA 141 142   K 3.8 3.7   CL 106 104   CO2 27 27   BUN 15 16   CREAT 1.0 0.9   CA 8.8 9.1   ALB 2.7* 2.8*   PROT 5.7* 6.0   BILITOTAL 0.6 0.5   ALKPHOS 64 84   ALT 9 6   AST 15 17   GLU 96 104*   MG -- --   PHOS -- --   SED -53; CRP 6.2  UA; large LE  UC- E Coli  No MRSA  No results found for this basename: INR:2,APTT:2 in the last 168 hours       Rads:       Current medications:     Scheduled Meds: PRN Meds:           amLODIPine 10 mg Oral Daily   bisoprolol 5 mg Oral Daily   ciprofloxacin 750 mg Oral Q12H SCH   cloNIDine 0.1 mg Oral Daily   docusate sodium 100 mg Oral Q12H SCH   ferrous sulfate 324 mg-ascorbic acid 500 mg combo dose  Oral  Daily   gabapentin 300 mg Oral Q12H SCH   hydrochlorothiazide 25 mg Oral Daily   lidocaine 2 patch Transdermal Q24H   multivitamin 1 tablet Oral Daily   polycarbophil 1,250 mg Oral Daily   potassium chloride 10 mEq Oral Daily   Senna 17.2 mg Oral QHS   tiotropium 18 mcg Inhalation QAM   traMADol 50 mg Oral Q6H SCH   [DISCONTINUED] ciprofloxacin 750 mg Oral Q12H [facility]   [DISCONTINUED] sulfamethoxazole-trimethoprim 1 tablet Oral Q12H SCH       Continuous Infusions:         albuterol 2.5 mg Q4H PRN   bisacodyl 10 mg QD PRN   diphenhydrAMINE 25 mg Q6H PRN   HYDROcodone-acetaminophen 1 tablet Q4H PRN   HYDROcodone-acetaminophen 2 tablet Q6H PRN   hydrOXYzine 25 mg Q12H PRN   naloxone 0.1 mg PRN   ondansetron 4 mg Q8H PRN   oxyCODONE 10 mg Q12H PRN   polyethylene glycol 17 g QD PRN   senna-docusate 2 tablet BID PRN   traZODone 100 mg QHS PRN   zolpidem 5 mg QHS PRN         Assessment:   78 y.o. 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 Ahmed Prima, he will f/u in AM  Poor wound healing: Juven ,Boost Breeze ; will add MVI     Insomnia: trazodone PRN   Pain: resolving;  lidoderm d/c'd  Constipation : controlled; Miralax changed to PRN  COPD: spiriva, albuterol, IS     E Coli UTI: resistant to Cipro,  Bactrim completed      Coordination of care:  Care coordinated with CM and RN    Discharge planning: changed to SNF next week, 10/14/13 due to patient's inability to negotiate stairs secondary to fused knee    Signed by: Earl Many DO     [facility]

## 2013-10-14 MED ORDER — LIDOCAINE 5 % EX PTCH
2.0000 | MEDICATED_PATCH | Freq: Every day | CUTANEOUS | Status: DC
Start: 2013-10-14 — End: 2014-03-31

## 2013-10-14 MED ORDER — CIPROFLOXACIN HCL 750 MG PO TABS
750.0000 mg | ORAL_TABLET | Freq: Two times a day (BID) | ORAL | Status: AC
Start: 2013-10-14 — End: 2013-11-03

## 2013-10-14 MED ORDER — HYDROCODONE-ACETAMINOPHEN 5-325 MG PO TABS
1.0000 | ORAL_TABLET | ORAL | Status: DC | PRN
Start: 2013-10-14 — End: 2014-03-31

## 2013-10-14 MED ORDER — CALCIUM POLYCARBOPHIL 625 MG PO TABS
1250.0000 mg | ORAL_TABLET | Freq: Every day | ORAL | Status: DC
Start: 2013-10-14 — End: 2014-03-31

## 2013-10-14 MED ORDER — TAB-A-VITE/BETA CAROTENE PO TABS
1.0000 | ORAL_TABLET | Freq: Every day | ORAL | Status: AC
Start: 2013-10-14 — End: ?

## 2013-10-14 MED ORDER — POTASSIUM CHLORIDE CRYS ER 10 MEQ PO TBCR
10.0000 meq | EXTENDED_RELEASE_TABLET | Freq: Every day | ORAL | Status: DC
Start: 2013-10-14 — End: 2014-03-31

## 2013-10-14 MED ORDER — SENNA 8.6 MG PO TABS
17.2000 mg | ORAL_TABLET | Freq: Every evening | ORAL | Status: DC | PRN
Start: 2013-10-14 — End: 2014-03-31

## 2013-10-14 MED ORDER — DSS 100 MG PO CAPS
100.0000 mg | ORAL_CAPSULE | Freq: Two times a day (BID) | ORAL | Status: DC | PRN
Start: 2013-10-14 — End: 2014-03-31

## 2013-10-14 MED ORDER — TRAZODONE HCL 100 MG PO TABS
100.0000 mg | ORAL_TABLET | Freq: Every evening | ORAL | Status: DC | PRN
Start: 2013-10-14 — End: 2014-03-31

## 2013-10-14 MED ORDER — FERROUS SULFATE 324 (65 FE) MG PO TBEC
324.0000 mg | DELAYED_RELEASE_TABLET | Freq: Every morning | ORAL | Status: DC
Start: 2013-10-15 — End: 2013-10-14

## 2013-10-14 MED ORDER — AMLODIPINE BESYLATE 10 MG PO TABS
10.0000 mg | ORAL_TABLET | Freq: Every day | ORAL | Status: DC
Start: 2013-10-14 — End: 2014-03-31

## 2013-10-14 MED ORDER — ALBUTEROL SULFATE (2.5 MG/3ML) 0.083% IN NEBU
2.5000 mg | INHALATION_SOLUTION | RESPIRATORY_TRACT | Status: DC | PRN
Start: 2013-10-14 — End: 2014-03-31

## 2013-10-14 MED ORDER — TRAMADOL HCL 50 MG PO TABS
50.0000 mg | ORAL_TABLET | Freq: Four times a day (QID) | ORAL | Status: DC
Start: 2013-10-14 — End: 2014-03-31

## 2013-10-14 MED ORDER — CLONIDINE HCL 0.1 MG PO TABS
0.1000 mg | ORAL_TABLET | Freq: Every day | ORAL | Status: AC
Start: 2013-10-14 — End: ?

## 2013-10-14 MED ORDER — GABAPENTIN 300 MG PO CAPS
300.0000 mg | ORAL_CAPSULE | Freq: Two times a day (BID) | ORAL | Status: AC
Start: 2013-10-14 — End: ?

## 2013-10-14 NOTE — Rehab Discharge Instruction ([facility]) (Signed)
[name]  MRN: [id number]  Account: [id number]  Session Start: 10/14/2013 12:00:00 AM  Session Stop: 10/14/2013 12:00:00 AM    Rehabilitation Nursing  Inpatient Rehabilitation Discharge Instructions    Discharge Date: Oct 14, 2013    Transportation: Branch    Recommendations for Follow-Up Care:   Yes, patient received valuables.  Bladder Program: Pt is continent and independent with bladder function and  management.  Bowel Program:  Last Bowel Movement- Oct 13, 2013   Pt is continent, uses the toilet to manage bowel function. Maintains intake of  stool softener to regulate bowels.  Skin: NO pressure areas noted. Incision sites stable, no drainage. (+) black  eschar inferior of the R knee incision; TheraHoney ointment maintained with  every dry dressing changes.  Current Diet: regular  Pain Management: Medication. Rx explained; R LE positioning and keeping R knee/  leg straight 100% of the time. Ice. Reinforced teachings about acceptable signs  of healing (incision), s/s of infection that needs to be reported, dressing  change and supplies used.    VITAL SIGNS  Blood Pressure: Field  mmHg  Temperature: Field  degrees  Pulse: Field  beats per minute  Respirations: Field  breaths per minute  Pain: Field    Peck Medications: Refer to separate medications list provided.        Please follow the home exercise programs provided by your therapists in addition  to the following recommendations.  Stop activity if shortness of breath or chest  pain occurs.    Call your physician if you experience excessive pain, fever, or other concerns.    PRECAUTIONS    A.  Don't move patient using affected arm or leg.  B.  Keep emergency numbers by the phone.  [place] brakes if using a wheelchair.  Move wheelchair foot rest prior to  standing.  D.  Remove obstacles from the home (throw rugs).  E.  Follow prescribed instructions.  F.  If you fall, call 911 for assistance.  G.  If you are on home oxygen then ensure that smoke detectors are  present and  properly functioning. No smoking when oxygen is in use. Wear only non-flammable  clothing when oxygen is in use.    When standing from a bed or chair, push off from bed/chair. Do not pull on  walker or helper. If using a wheelchair, always lock the brakes and remove leg  rests before getting in/out of wheelchair.    No driving until cleared by your primary care physician.      MEDICATION USE GUIDELINES    1. Become familiar with the names of your medication(s) and their appearance.  2. Know why you are taking each medication, what undesirable effects you may  expect, and when to contact your physician.  3. Consult with a pharmacist and /or your physician before using any over the  counter (OTC) medications.  4. Be compliant with your medication schedule.  If you feel you cannot, then  discuss alternatives with your physician.  5. If you are taking Enteric coated or extended/delayed release product(s), then  never crush, chew, open, cut, break or destroy by any means because serious  effects can occur.  6. When storing your medications, avoid hot and humid places (i.e., kitchen,  bathrooms).  7. Select a safe place out of children's reach to store medications.  Follow any  other special instructions.  8. Inform your doctors of all the medications you are taking, including those  you buy without a prescription.  9. Use the same pharmacy to buy your prescriptions whenever possible.    Signed by: Gordy Clement, RN3 10/14/2013 12:55:00 AM

## 2013-10-14 NOTE — Rehab Progress Note ([facility]il[facility]) (Signed)
[name]  MRN: [id number]  Account: [id number]  Session Start: 10/14/2013 12:00:00 AM  Session Stop: 10/14/2013 12:00:00 AM    Rehabilitation Nursing  Inpatient Rehabilitation Shift Assessment    Rehab Diagnosis: Gait Disturbance  Demographics:            Age: 78Y            Gender: Female  Primary Language: english    Date of Onset:  09-05-13  Date of Admission: 10/03/2013 6:43:00 PM    Rehabilitation Precautions Restrictions:   Fall, skin, 10% WB RLE    Patient Report: "I have n pain".  Pain: Patient currently without complaints of pain.  Pain Reassessment: Pain was not reassessed as no pain was reported.  Patient/Caregiver Goals:  I want to be as independent as possible    NEURO  Orientation/Awareness: Alert and Oriented x3.  Speech: No deficits noted at this time.  Behavior: Cooperative.    MEDICATIONS  IV Access: No IV access.  Dialysis Access: Patient does not have dialysis access.      Elopement Risk Level Assessment Tool  Patient Criteria: Patient is not capable of leaving the unit.  Assessment is not  applicable.      RISK ASSESSMENT FOR FALLS/INJURY    MENTAL STATUS CRITERIA:   0 - None identified.  MENTAL STATUS TOTAL: 0    AGE CRITERIA:   28 - 34-54 years old  AGE TOTAL: 1    ELIMINATION CRITERIA:   3 - Toileting with Assistance.   ELIMINATION TOTAL: 3    HISTORY OF FALLS CRITERIA:   2 - Unknown History.  HISTORY OF FALLS TOTAL: 2    MEDICATIONS CRITERIA:   2 or more High Risk Medications (*see list below)   MEDICATIONS TOTAL: 2    PHYSICAL MOBILITY CRITERIA:   3 - Decreased balance reaction.   1 - Weakness/impaired physical mobility.   PHYSICAL MOBILITY TOTAL: 4    FALLS RISK ASSESSMENT TOTAL: 12    Patient's Fall Risk: TOTAL SCORE >10: High Risk    Falls Interventions: Clutter removed and clear path to BR.  Call bell, phone, glasses, etc within reach.  Hourly toileting/safety checks between 6am and 10pm, then every 2 hours.  Initiate Fall care plan and outcome.  Pharmacy review of  meds.    NUTRITION  Diet:  Type: Regular.  Food Consistency: Regular.  Liquid Consistency: Thin.      CARDIOVASCULAR     Bilateral lower extremities  Nail Bed Color: Pink.   R lower leg edema -resolving skin warm to touch, strong pedal pulses  Pulses:   Apical Pulse: Regular. Strong. Rate is .   Patient does not have a pacemaker.   Patient does not have a defibrillator.    CARDIOPULMONARY  Lung Sounds:   Upper lobes. Clear.  Type of Respirations: Regular.  Cough: No cough noted.  Respiratory Support: The patient does not require any respiratory support.  Respiratory Equipment: None.    INTEGUMENTARY  Skin:  Temperature: Warm  Turgor: Normal for age  Moisture: Dry  Color of skin: Normal for Race/Ethnicity  Capillary Refill: Less than 3 seconds  Wounds/Incisions:     Surgical Incision: Lower R leg (posterior) incision Length: 18 centimeter(s)  with staples. No signs of infection.  Drainage: Incision without drainage.  Odor:  No  Incision Care: kept OTA     Surgical Incision: Right knee incision. Length: 20 centimeter(s) with staples  and sutures. Swelling present.  Drainage: Incision without drainage.  Odor:  No  Incision Care: (+) stable black eschar along incision site, inferior of incision  line. TheraHoney ointment applied, dry dressing changed       Healed drain site at the R lower leg; kept OTA  Braden Scale for Predicting Pressure Sore Risk: Sensory Perception: No  impairment  Moisture: Rarely moist  Activity: Walks occasionally  Mobility: No limitation  Nutrition: Excellent  Friction and Shear: No apparent problem  Braden Score: 22  Level of Risk: No risk (19-23). Will reassess every shift.    GASTROINTESTINAL  Abdomen: Soft. Obese. Nontender.  Bowel Sounds:  Active bowel sounds audible in all four quadrants.  Date of Last Bowel Movement:  10/13/2013   No Problems/Complaints with Bowel Elimination Assessed.    GENITOURINARY  Current Bladder Pattern: Continent  Color:  Yellow   Patient denies problems with  urination and/or catheter.    MUSCULOSKELETAL  Upper Body: none  Lower Body: R LE 10% WB, no bending    Functional Measures      UPPER BODY DRESSING: Upper Body Dressing Score = 5. Patient is  supervision/set-up for upper body dressing, requiring: Setting out upper body  dressing equipment.  Gathering/setting out clothes.  Patient requires the following assistive device(s):  No assistive devices were  required.    TOILETING: Toileting Score = 6.  Patient is modified independent for toileting,  requiring: Safety considerations.  More than reasonable time.  Adaptive device to maintain balance.  Grab bar.    BLADDER MANAGEMENT - LEVEL OF ASSIST: Bladder Score = 7. Patient is completely  independent for bladder management. There are no activity limitations.    BLADDER ACCIDENTS THIS SHIFT:  0 . Patient has not had an accident this  assessment and did not require medications or devices.    BOWEL MANAGEMENT - LEVEL OF ASSIST: Bowel Score = 6.  Patient is modified  independent for bowel management.  Patient did not have bowel movement.  Medication/intervention was provided. Pt claimed "had moved bowel today".    BOWEL ACCIDENTS THIS SHIFT: 0 . Patient has not had an accident, but used a  stool softener.    TRANSFERS BED/CHAIR/WHEELCHAIR: Bed/chair/wheelchair Transfer Score = 5.  Patient is supervision/set-up for transferring to and from the  bed/chair/wheelchair, requiring: Stand by assistance.  Application of assistive/adaptive devices.  Set up (positioning equipment, lock brakes and/or adjust foot rest).  Patient requires the following assistive device(s): Bed rails.  Elevated head of bed.  Elevated bed/surface.    TRANSFER TOILET: Toilet Transfer Score = 6.  Patient is modified independent for  transferring to and from the toilet/commode, requiring: Safety considerations.  More than reasonable time.  Walker.  Grab bars. Pt was limited by R LE to be kept straight    COMPREHENSION: Auditory comprehension is the usual  mode. Comprehension Score =  7, Independent.  Patient comprehends complex/abstract information in their  primary language.  Patient is completely independent for auditory comprehension.   There are no activity limitations.    EXPRESSION: Vocal expression is the usual mode. Expression Score = 7,  Independent.  Patient expresses complex/abstract information in their primary  language.  Patient is completely independent for vocal expression.  There are no  activity limitations.    Education Provided:    Education Provided: Precautions. Plan of care. Safety issues and interventions.  Fall protocol. Use of adaptive devices. Skin/wound care. Signs/symptoms of  infection. Incision care. Critical pressure areas. Prevention of skin breakdown.  Audience: Patient and significant other.       Mode: Explanation.  Demonstration.       Response: Applied knowledge.  Verbalized understanding.    Discharge: Patient is being discharged at this time.    Long Term Goals: Within 2 weeks, patient will be able verbalize 2/10 on pain  scale on a consistent basis for effective pain management. - Goal Not Met  2 weeks from 10/03/2013  Short Term Goals: Within 2 weeks, patient will be able to state medications  being taken along with indications, and possible side effects. - Goal Not Met  2 weeks from 10/03/2013    PROGRESS TOWARD GOALS: SHORT TERM GOAL REVIEW:       1. Pt and family states understanding of currently ordered meds while  referring to the active med list and info provided. - Met       2. Within 2 weeks, patient will be able to state medications being taken  along with indications, and possible side effects. - Not Met: Goal modified       New Goal: Pt states acceptable pain at the R LE inorder to participate and  demonstrate optimal functional mobility with MOD I.  Time frame to achieve short term goal(s):  2 weeks from 10/03/2013   LONG TERM GOAL REVIEW:       1. Pt will report pain level 0-3/10 with scheduled pain  medication, and  without the need for PRN pain med intake. - Met       2. Within 2 weeks, patient will be able verbalize 2/10 on pain scale on a  consistent basis for effective pain management. - Met       New Goal: Pt and family will be knowledgeable and independent with  scheduled medications to be taken at discharge.  Timeframe to achieve long term goal(s):  2 weeks from 10/03/2013    PLAN: Nursing Specific Interventions  Medication Management. Pain Management. Skin Management. Wound Management.  Continue with the current Nursing Plan of Care.    TEAM CARE PLAN  Identified problems from team documentation:  Problem: Impaired Mobility  Mobility: Primary Team Goal: Pt will be indep with all bed mobility and  transfers with LRAD, indep with walking at least 43ft iwth LRAD, mod-max A for 4  steps with LRAD and HR/Active    Problem: Impaired Pain Management  Pain Mgmt: Primary Team Goal: Team will collaborate and work efficiently and  effectively in achieving manageable pain levels while participating in acute  rehab./    Problem: Impaired Self-care Mgmt/ADL/IADL  Self Care: Primary Team Goal: Patient will perform toileting transfer and task  with MOD I.  Caregiver will demo safe supervision for bathing and LB dressing  according to family training./Active    Add/Update Problems from this assessment:  No updates at this time.    Please review Integrated Patient View Care Plan Flowsheet for Team identified  Problems, Interventions, and Goals.    Signed by: Gordy Clement, RN3 10/14/2013 12:45:00 AM

## 2013-10-14 NOTE — Rehab Discharge Instruction ([facility]) (Signed)
[name]  MRN: [id number]  Account: [id number]  Session Start: 10/14/2013 12:00:00 AM  Session Stop: 10/14/2013 12:00:00 AM    Rehabilitation Nursing  Inpatient Rehabilitation Discharge Instructions    Discharge Date: Oct 14, 2013    Transportation: Patient's Mode of Transportation: Doctor, general practice  Accompanied By:  transport co.    Recommendations for Follow-Up Care:   Yes, patient received valuables.  Bladder Program: Pt is continent and independent with bladder function and  management.  Bowel Program:  Last Bowel Movement- Oct 13, 2013   Pt is continent, uses the toilet to manage bowel function. Maintains intake of  stool softener to regulate bowels.  Skin: NO pressure areas noted. Incision sites stable, no drainage. (+) black  eschar inferior of the R knee incision; TheraHoney ointment maintained with  every dry dressing changes.  Current Diet: regular  Pain Management: Medication. Rx explained; R LE positioning and keeping R knee/  leg straight 100% of the time. Ice. Reinforced teachings about acceptable signs  of healing (incision), s/s of infection that needs to be reported, dressing  change and supplies used.    VITAL SIGNS  Blood Pressure:   mmHg  Temperature:   degrees  Pulse: 74  beats per minute  Respirations: 18  breaths per minute  Pain: 4/10     Medications: Refer to separate medications list provided.        Please follow the home exercise programs provided by your therapists in addition  to the following recommendations.  Stop activity if shortness of breath or chest  pain occurs.    Call your physician if you experience excessive pain, fever, or other concerns.    PRECAUTIONS    A.  Don't move patient using affected arm or leg.  B.  Keep emergency numbers by the phone.  [place] brakes if using a wheelchair.  Move wheelchair foot rest prior to  standing.  D.  Remove obstacles from the home (throw rugs).  E.  Follow prescribed instructions.  F.  If you fall, call 911 for assistance.  G.  If you are on  home oxygen then ensure that smoke detectors are present and  properly functioning. No smoking when oxygen is in use. Wear only non-flammable  clothing when oxygen is in use.    When standing from a bed or chair, push off from bed/chair. Do not pull on  walker or helper. If using a wheelchair, always lock the brakes and remove leg  rests before getting in/out of wheelchair.    No driving until cleared by your primary care physician.      MEDICATION USE GUIDELINES    1. Become familiar with the names of your medication(s) and their appearance.  2. Know why you are taking each medication, what undesirable effects you may  expect, and when to contact your physician.  3. Consult with a pharmacist and /or your physician before using any over the  counter (OTC) medications.  4. Be compliant with your medication schedule.  If you feel you cannot, then  discuss alternatives with your physician.  5. If you are taking Enteric coated or extended/delayed release product(s), then  never crush, chew, open, cut, break or destroy by any means because serious  effects can occur.  6. When storing your medications, avoid hot and humid places (i.e., kitchen,  bathrooms).  7. Select a safe place out of children's reach to store medications.  Follow any  other special instructions.  8. Inform your  doctors of all the medications you are taking, including those  you buy without a prescription.  9. Use the same pharmacy to buy your prescriptions whenever possible.    Signed by: French Ana, RN 10/14/2013 5:00:00 PM

## 2013-10-14 NOTE — Rehab Discharge Summary ([facility]) (Signed)
[name]  MRN: [id number]  Account: [id number]  Session Start: 10/14/2013 3:00:00 PM  Session Stop: 10/14/2013 4:00:00 PM    Physical Therapy  Inpatient Rehabilitation Discharge Summary and Note    Rehab Diagnosis: Gait Disturbance  Demographics:            Age: 78Y            Gender: Female  Primary Language: english  Past Medical History: L total knee replacement  History of Present Illness: This 78 y.o. year old female with history of R TKA  on 08/25/13 by Dr. Ahmed Prima was readmitted to [facility] on 09/05/13 and underwent  revisions on 09/05/13, 12/8 /14, 09/22/13 with muscle flap/ skin grafting by  plastic surgery on 09/22/13. The patient is to keep leg extended ; current WB  status is 10 %; wound culture was positive for Proteus. Per ID ,continue Cipro  through 10/27/13   Date of Onset: 09-05-13   Date of Admission: 10/03/2013 6:43:00 PM  Rehabilitation Precautions/Restrictions:   Fall, skin, 10% WB RLE  Maintain R knee extension AAT    SUBJECTIVE  Pain: Pt with 5/10 pain throughout most of session.  Pain increased with supine  ther ex's but decreases following relaxation techniques.    OBJECTIVE  General Observation: PT student present during session with pt's permission.    Bed Mobility: Pt completes all bed mobility tasks Mod I from flat surface.    Refer to Functional Measures for remainder of functional mobility status.    Functional Measures      TRANSFERS BED/CHAIR/WHEELCHAIR: Bed/chair/wheelchair Transfer Score = 6.  Patient is modified independent for transferring to and from the  bed/chair/wheelchair, requiring: Walker.    TRANSFER TOILET: Toilet Transfer Score = 6.  Patient is modified independent for  transferring to and from the toilet/commode, requiring: Walker.    TOILETING: Toileting Score = 6.  Patient is modified independent for toileting,  requiring: Adaptive device to maintain balance.    LOCOMOTION WHEELCHAIR:   Wheelchair locomotion was observed using a manual wheelchair. Wheelchair  Distance  Scale = 3.  Distance traveled in wheelchair is greater than 150 feet.  Wheelchair Score = 5.  Patient is supervision or set up only for propelling  wheelchair, requiring: Mod I over level surfaces and through small spaces, 3%  grade not assessed. .  Patient was able to propel a distance of 160 feet in a  wheelchair. No assistive devices required.    LOCOMOTION WALK:   Walk Distance Scale = 1.  Distance walked is less than 50 feet. Walk Score = 1.   Patient requires supervision or set up only. Patient walked a distance of 20  feet. Rolling walker.    LOCOMOTION STAIRS: Stairs did not occur because activity was unsafe for patient.      Today's Treatment Interventions:       Therapeutic Activities:  Pt participates in gait training 20' x 2 with SBA  and RW, with PT providing w/c follow.  She completes lateral pivot transfers bed  <>w/c<> toilet using RW Mod I and manages clothing Mod I.  She participates in  supine ther ex's for improved BLE strength, and completes gait training approx  Mod I over level surfaces and through small spaces.  Following session she is  left sitting up in w/c at bedside with call bell in reach and needs met, hand  off to nursing.      Equipment Provided/Ordered: None at this time. TBA at next facility.  ASSESSMENT  Summary of Progress and Current Status: Pt has made progress towards functional  goals in AR however remains limited by WB restrictions and overall decreased  strength, resulting in limited ability to ambulate.  She will require continued  therapy before returning home, as she remains unsafe to negotiate stairs at this  time.    Previously Documented Mode of Locomotion at Discharge: Walk  PAI Expected Mode of Locomotion at Discharge: The patient's performance warrants  a change to the current documented expected, most frequently used mode of  locomotion at discharge The expected mode of most frequently used locomotion, at  discharge, is expected to be wheelchair.    Progress  Toward Goals (final status):   LONG TERM GOAL REVIEW:       1. Pt will be indep with all bed mobility to promote indep morning routine  - Met       2. Pt will indep with LRAD with all transfers for safe negotiation of her  home - Met       3. Pt will walk at least 73ft with LRAD and indep to access all areas of  her home - Not Met       4. Pt will perform car transfers with LRAD and indep in order to attend MD  appts as needed - Not Met:       5. Pt will negotiate at least 4 steps with LRAD and mod-max A for  safety/steadying in order to enter and exit her home safely - Not Met:    PLAN  Physical Therapy Plan: Patient is recommended for sub-acute therapy services.    Team Care Plan  Please review Integrated Patient View Care Plan Flowsheet for Team identified  Problems, Interventions, and Goals.    Identified problems from team documentation:  Problem: Impaired Mobility  Mobility: Primary Team Goal: Pt will be indep with all bed mobility and  transfers with LRAD, indep with walking at least 17ft with LRAD, mod-max A for 4  steps with LRAD and HR/Active    Problem: Impaired Pain Management  Pain Mgmt: Primary Team Goal: Team will collaborate and work efficiently and  effectively in achieving manageable pain levels while participating in acute  rehab./    Problem: Impaired Self-care Mgmt/ADL/IADL  Self Care: Primary Team Goal: Patient will perform toileting transfer and task  with MOD I.  Caregiver will demo safe supervision for bathing and LB dressing  according to family training./Not Met    Status update for discharge:     Mobility:   Primary Goal: Not Met    3 Hour Rule Minutes: 60 minutes of PT treatment this session count towards  intensity and duration of therapy requirement. Patient was seen for the full  scheduled time of PT treatment this session.    Signed by: Jaquita Rector, PT 10/14/2013 4:00:00 PM

## 2013-10-14 NOTE — Rehab Progress Note ([facility]) (Signed)
[name]  MRN: [id number]  Account: [id number]  Session Start: 10/14/2013 1:00:00 PM  Session Stop: 10/14/2013 2:00:00 PM    Occupational Therapy  Inpatient Rehabilitation Progress Note - Brief    Rehab Diagnosis: Gait Disturbance  Demographics:            Age: 5Y            Gender: Female  Rehabilitation Precautions/Restrictions:   Fall, skin, 10% WB RLE    SUBJECTIVE  Patient Report: "I will tell you what is crazy; my toe is killing me!"  Pain: Patient currently has pain.  Patient reports a pain level of 4 out of 10. Repositioned patient.    OBJECTIVE  General Observation: Pt received supine in bed, c/o pain in bilateral great  toes, R<L.  RN informed to check into the status, but toe looked slightly red on  the tip and slightly tender to the touch.  Pt agreeable to perform BUE ther-ex.    Interventions:       Therapeutic Exercise:  Heat applied to R shoulder prior to AROM exercises.  Pt performed scapular and neck mobility in all planes, the shoulder flexion and  abduction to tolerance.  Pt performed pendulum exercises RUE in order to  maintain shoulder joint flexbility while eliminating gracity in pain free  movements.  Pain Reassessment: Branch    Education Provided:  Branch    ASSESSMENT  Patient is increasing functional ROM in RUE but continues to c/o pain with  flexion or abduction beyond 90 degrees.  Pt is awaiting SNF placement today or  tomorrow.    PLAN  Continued Occupational Therapy is recommended.  Recommended Frequency/Duration/Intensity: sub acute  Continued Activities Contributing Toward Care Plan: Therapeutic exercise,  therapeutic activity, ADL/transfer training, balance training, endurance  training, pt/family education    3 Hour Rule Minutes: 60 minutes of OT treatment this session count towards  intensity and duration of therapy requirement. Patient was seen for the full  scheduled time of OT treatment this session.    Signed by: Tyrone Sage, OTR/L 10/14/2013 2:00:00 PM

## 2013-10-14 NOTE — Rehab Discharge Instruction ([facility]) (Signed)
[name]  MRN: [id number]  Account: [id number]  Session Start: 10/14/2013 12:00:00 AM  Session Stop: 10/14/2013 12:00:00 AM    Case Management  Inpatient Rehabilitation Discharge Instructions    Discharge Plan: Discharge 10/14/13 to [facility] / Address  [address], [place], [place] [address] / Room 129 / Please call report to T:  [phone].    To be transported by PTS WC van at 6:00 PM T: [phone].    Follow-up Appointment(s):  Within two weeks of discharge, follow-up with:  Dr. Lane Hacker, Orthopedic Surgeon  Contact Information: [address]  [address]  [place], [place] [address]  T: [phone]    Dr. Katha Hamming, Plastic Surgeon  Contact Information: [address]  Hyperbaric Unit  [place], [place] [address]  T: [phone]    Dr. Tillie Rung, Infectious Disease  Contact Information: [address]  [address]  [place], [place] [address]  T: [phone]    As directed follow-up with:  Dr. Janalyn Rouse, Primary Care Physician  Contact Information: T: [phone]    Additional Information: Discharge instructions discussed with [name] who  verbalized agreement and understanding with plan.    We wish you well with your continued recovery.    Signed by: Patrice Paradise, RN, MSN 10/14/2013 4:16:00 PM

## 2013-10-14 NOTE — Discharge Summary (Signed)
REHABILITATION MEDICINE DISCHARGE SUMMARY    Patient Identification  [name] is a 78 y.o. female.  DOB:  01-30-1936    Date of admission: 10/03/2013    Date of discharge: 10/14/13    Attending Provider: Earl Many, DO       Discharge Physician:  Earl Many    Admission Diagnoses: Gait abnormality [781.2] (GAIT DISTURBANCE)  Difficulty walking  [id number] 78 y.o. year old female with history of R TKA on 08/25/13 by Dr. Ahmed Prima was readmitted to [facility] on 09/05/13 and underwent revisions on 09/05/13, 12/8 /14, 09/22/13 with muscle flap/ skin grafting by plastic surgery on 09/22/13. The patient is to keep leg extended ; current WB status is 10 %   Wound culture was positive for Proteus. Per ID ,continue Cipro     Hospital Course:  The patient underwent comprehensive rehabilitation including  PT/OT; she was followed by plastic surgery, ID and orthopedic surgery.She is to cont Cipro through 11/03/12 ; cont 10%WB to R LE  The following issues were addressed:  complicated R TKA on 08/25/13, revisions on 09/05/13, 12/8 /14, 09/22/13 with muscle flap/ skin grafting on 09/22/13 :incision monitoring, daily therahoney dressing change, 10% WB to R LE;   Per ID cont cipro through 11/03/12   Patient must keep R knee extended at all times- should not use knee immob due to pressure   wound nurse consult appreciated   Poor wound healing: Juven ,Boost Breeze ;  MVI   Insomnia: trazodone PRN   Pain: resolving; lidoderm d/c'd   Constipation : controlled; Miralax changed to PRN   COPD: spiriva, albuterol, IS   E Coli UTI: resistant to Cipro, Bactrim completed        Discharge Exam:  Filed Vitals:    10/12/13 2038 10/13/13 0614 10/13/13 1938 10/14/13 0528   BP: 124/56 144/57 111/48 135/68   Pulse: 73 70 69 74   Temp: 97.5 F (36.4 C) 97.9 F (36.6 C) 98.1 F (36.7 C) 99.1 F (37.3 C)   TempSrc:       Resp: 18 17 17 18    SpO2: 94% 97% 96% 91%   CV:+S1S2 Heart rate and rhythm  are regular.   Pulm:Lungs are clear to auscultation bilaterally.   BJY:NWGN. Non-tender. Normoactive bowel sounds.   Skin: Healing R knee incision with serosang drainage from the lower end of the incision , resolving edema   Neuro : alert, oriented .Sensation to LT is intact.Normal muscle tone and bulk   MMS:MMT- grossly 5/5 except R KF/KE- NT due to immobilization   Extremities: no calf tenderness    Labs:    Lab  10/13/13 0559  10/10/13 0635    WBC  6.53  8.12    HGB  9.2*  9.7*    HCT  29.3*  30.7*    PLT  231  267        Lab  10/13/13 0559  10/10/13 0635    NA  141  142    K  3.8  3.7    CL  106  104    CO2  27  27    BUN  15  16    CREAT  1.0  0.9    CA  8.8  9.1    ALB  2.7*  2.8*    PROT  5.7*  6.0    BILITOTAL  0.6  0.5    ALKPHOS  64  84    ALT  9  6  AST  15  17    GLU  96  104*    MG  --  --    PHOS  --  --    SED -53; CRP 6.2   UA; large LE   UC- E Coli   No MRSA    Discharge Functional Status:      Pt demo I with bed mobility from a flat surface   without the use of bed rails. Pt demo S for toileting and clothing management.   Pt self-propel w/c for 150' with increased time. Gait training with RW for 64'   10' then 12' with w/c follow and CGA. Pt perform transfers from w/c to mat with S.     Current Discharge Medication List      START taking these medications    Details   albuterol (PROVENTIL) (2.5 MG/3ML) 0.083% nebulizer solution Take 3 mLs (2.5 mg total) by nebulization every 4 (four) hours as needed for Wheezing.  Qty: 75 mL, Refills: 0      lidocaine (LIDODERM) 5 % Place 2 patches onto the skin daily. Remove & Discard patch within 12 hours or as directed by MD  Qty: 30 patch, Refills: 0      multivitamin (MULTIVITAMIN) Tab Take 1 tablet by mouth daily.  Qty: 30 tablet, Refills: 0      polycarbophil (FIBERCON) 625 MG tablet Take 2 tablets (1,250 mg total) by mouth daily.  Qty: 60 tablet, Refills: 0      potassium chloride (K-DUR,KLOR-CON) 10 MEQ tablet Take 1 tablet (10 mEq total) by mouth  daily.  Qty: 30 tablet, Refills: 0      Senna (SENOKOT) 8.6 MG Tab Take 2 tablets (17.2 mg total) by mouth nightly as needed.  Qty: 120 each, Refills: 0      traZODone (DESYREL) 100 MG tablet Take 1 tablet (100 mg total) by mouth nightly as needed for Sleep.  Qty: 30 tablet, Refills: 0         CONTINUE these medications which have CHANGED    Details   amLODIPine (NORVASC) 10 MG tablet Take 1 tablet (10 mg total) by mouth daily.  Qty: 30 tablet, Refills: 0      ciprofloxacin (CIPRO) 750 MG tablet Take 1 tablet (750 mg total) by mouth every 12 (twelve) hours.  Qty: 84 tablet, Refills: 0      cloNIDine (CATAPRES) 0.1 MG tablet Take 1 tablet (0.1 mg total) by mouth daily.  Qty: 30 tablet, Refills: 0      docusate sodium 100 MG Cap Take 100 mg by mouth 2 (two) times daily as needed for Constipation.  Qty: 10 capsule, Refills: 0      gabapentin (NEURONTIN) 300 MG capsule Take 1 capsule (300 mg total) by mouth every 12 (twelve) hours.  Qty: 60 capsule, Refills: 0      HYDROcodone-acetaminophen (NORCO) 5-325 MG per tablet Take 1 tablet by mouth every 4 (four) hours as needed.  Qty: 30 tablet, Refills: 0      traMADol (ULTRAM) 50 MG tablet Take 1 tablet (50 mg total) by mouth every 6 (six) hours.  Qty: 30 tablet, Refills: 0         CONTINUE these medications which have NOT CHANGED    Details   aspirin EC 325 MG EC tablet Take 1 tablet (325 mg total) by mouth daily.  Qty: 30 tablet, Refills: 0      hydrochlorothiazide (HYDRODIURIL) 25 MG tablet Take 25 mg by mouth daily.  nebivolol (BYSTOLIC) 5 MG tablet Take 5 mg by mouth daily.      ondansetron (ZOFRAN-ODT) 4 MG disintegrating tablet Take 1 tablet (4 mg total) by mouth every 8 (eight) hours as needed for Nausea.  Qty: 20 tablet, Refills: 0      tiotropium (SPIRIVA) 18 MCG inhalation capsule Place 18 mcg into inhaler and inhale daily.         STOP taking these medications       aspirin 325 MG tablet Comments:   Reason for Stopping:         celecoxib (CELEBREX) 200 MG  capsule Comments:   Reason for Stopping:         celecoxib (CELEBREX) 200 MG capsule Comments:   Reason for Stopping:               Discharge diagnoses:   78 y.o. female with dysfunction of mobility/ ADL due to complicated R TKA on 08/25/13, revisions on 09/05/13, 12/8 /14, 09/22/13 with muscle flap/ skin grafting on 09/22/13    Discharge location:Subacute rehab facility    Discharge instructions:  Refrain from driving pending further evaluation by physician and/or OT.  Continue weightbearing restrictions until seen in follow-up by your orthopedic surgeon.  Follow-up with primary care physician within 2 weeks.  Refer to case management discharge summary for additional follow-up information.    Follow-up Appointment(s):   Within two weeks of discharge, follow-up with:   Dr. Lane Hacker, Orthopedic Surgeon   Contact Information: [address]   [address]   [place], [place] [address]   T: [phone]   Dr. Katha Hamming, Plastic Surgeon   Contact Information: [address]   Hyperbaric Unit   [place], [place] [address]   T: [phone]   Dr. Tillie Rung, Infectious Disease   Contact Information: [address] [place]   [address]   [place], [place] [address]   T: [phone]   As directed follow-up with:   Dr. Janalyn Rouse, Primary Care Physician   Contact Information: T: [phone]     Rehabilitation services required:  PT/OT  Discharge medications:      Jimmye, [place]   Home Medication Instructions [id number]    Printed on:10/14/13 1628   Medication Information                      tiotropium (SPIRIVA) 18 MCG inhalation capsule  Place 18 mcg into inhaler and inhale daily.             hydrochlorothiazide (HYDRODIURIL) 25 MG tablet  Take 25 mg by mouth daily.             nebivolol (BYSTOLIC) 5 MG tablet  Take 5 mg by mouth daily.             ondansetron (ZOFRAN-ODT) 4 MG disintegrating tablet  Take 1 tablet (4 mg total) by mouth every 8 (eight) hours as needed for Nausea.             aspirin EC 325 MG EC tablet  Take 1  tablet (325 mg total) by mouth daily.             HYDROcodone-acetaminophen (NORCO) 5-325 MG per tablet  Take 1 tablet by mouth every 4 (four) hours as needed.             traMADol (ULTRAM) 50 MG tablet  Take 1 tablet (50 mg total) by mouth every 6 (six) hours.  albuterol (PROVENTIL) (2.5 MG/3ML) 0.083% nebulizer solution  Take 3 mLs (2.5 mg total) by nebulization every 4 (four) hours as needed for Wheezing.             gabapentin (NEURONTIN) 300 MG capsule  Take 1 capsule (300 mg total) by mouth every 12 (twelve) hours.             traZODone (DESYREL) 100 MG tablet  Take 1 tablet (100 mg total) by mouth nightly as needed for Sleep.             cloNIDine (CATAPRES) 0.1 MG tablet  Take 1 tablet (0.1 mg total) by mouth daily.             amLODIPine (NORVASC) 10 MG tablet  Take 1 tablet (10 mg total) by mouth daily.             lidocaine (LIDODERM) 5 %  Place 2 patches onto the skin daily. Remove & Discard patch within 12 hours or as directed by MD             ciprofloxacin (CIPRO) 750 MG tablet  Take 1 tablet (750 mg total) by mouth every 12 (twelve) hours.             docusate sodium 100 MG Cap  Take 100 mg by mouth 2 (two) times daily as needed for Constipation.             polycarbophil (FIBERCON) 625 MG tablet  Take 2 tablets (1,250 mg total) by mouth daily.             Senna (SENOKOT) 8.6 MG Tab  Take 2 tablets (17.2 mg total) by mouth nightly as needed.             potassium chloride (K-DUR,KLOR-CON) 10 MEQ tablet  Take 1 tablet (10 mEq total) by mouth daily.             multivitamin (MULTIVITAMIN) Tab  Take 1 tablet by mouth daily.                   CC: Janalyn Rouse, MD                                Discharge planning, preparation,  coordination, and face to face discussion with the patient  took more than  35 minutes     Hendricks Limes, DO

## 2013-10-14 NOTE — Rehab Discharge Summary ([facility]) (Signed)
NAMESHEKELA [name]  MRN: [id number]  Account: [id number]  Session Start: 10/14/2013 8:00:00 AM  Session Stop: 10/14/2013 9:00:00 AM    Occupational Therapy  Inpatient Rehabilitation Discharge Summary and Note    Rehab Diagnosis: Gait Disturbance  Demographics:            Age: 78Y            Gender: Female    Past Medical History: L total knee replacement  History of Present Illness: This 78 y.o. year old female with history of R TKA  on 08/25/13 by Dr. Ahmed Prima was readmitted to [facility] on 09/05/13 and underwent  revisions on 09/05/13, 12/8 /14, 09/22/13 with muscle flap/ skin grafting by  plastic surgery on 09/22/13. The patient is to keep leg extended ; current WB  status is 10 %; wound culture was positive for Proteus. Per ID ,continue Cipro  through 10/27/13   Date of Onset: 09-05-13   Date of Admission: 10/03/2013 6:43:00 PM  Rehabilitation Precautions/Restrictions:   Fall, skin, 10% WB RLE    SUBJECTIVE  Patient Report: Patient acknowledged that she feels okay.  Pain: Patient acknowledged that she has R knee pain. (OT informed RN that  patient would like pain med prior to next therapy.)    OBJECTIVE  Functional Measures    EATING: Eating Score = 7. Patient is completely independent for eating.  There  are no activity limitations.    GROOMING: Grooming Score = 7.  Patient is completely independent for grooming.  There are no activity limitations.    BATHING: Patient bathed in shower. Bathing Score = 5.  Patient is  supervision/set-up for bathing, requiring: Setting out bathing equipment.  Patient requires the following assistive device(s): Grab bar/arm rest to  maintain balance.  Hand held shower.    UPPER BODY DRESSING: Upper Body Dressing Score = 5. Patient is  supervision/set-up for upper body dressing, requiring: Gathering/setting out  clothes.  Patient requires the following assistive device(s):  No assistive devices were  required.    LOWER BODY DRESSING: Patient requires no physical assistance for donning  and/or  doffing undergarment threading right leg. Patient requires no physical  assistance for donning and/or doffing undergarment threading left leg. Patient  requires no physical assistance for donning and/or doffing undergarment over  hips and adjusting fasteners. Patient requires no physical assistance for  donning and/or doffing pants/skirt threading right leg. Patient requires no  physical assistance for donning and/or doffing pants/skirt threading left leg.  Patient requires no physical assistance for donning and/or doffing pants/skirt  over hips and adjusting fastener. Patient requires no physical assistance for  donning and/or doffing right sock. Patient requires no physical assistance for  donning and/or doffing left sock. Patient requires maximal assistance for  donning and/or doffing right shoe. Patient requires no physical assistance for  donning and/or doffing left shoe. Patient performs 92.5 % of lower body dressing  tasks. Lower Body Dressing Score = 4, Minimal Assistance.  Patient requires the following assistive device(s): Reacher.  Sock aid.  Long handled shoe horn.    TOILETING: Toileting Score = 5.  Patient is supervision/set-up for toileting,  requiring: Setting out toileting equipment.  Patient requires the following assistive device(s):  Grab bar.    TRANSFERS BED/CHAIR/WHEELCHAIR: Bed/chair/wheelchair Transfer Score = 5.  Patient is supervision/set-up for transferring to and from the  bed/chair/wheelchair, requiring: Stand by assistance.  Set up (positioning equipment, lock brakes and/or adjust foot rest).  Patient requires the following assistive device(s): Seating system of  wheelchair.    TRANSFER TOILET: Toilet Transfer Score = 5.  Patient is supervision/set-up for  transferring to and from the toilet/commode, requiring: Stand by assistance.  Set up (positioning equipment, lock brakes and/or adjust foot rests).  Patient requires the following assistive device(s):  Safety frame/over  the  toilet.    TRANSFER SHOWER: Shower Transfer Score = 5.  Patient is supervision/set-up for  transferring to and from the shower, requiring: Stand by assistance.  Set up (positioning equipment, lock brakes and/or adjust foot rests). Grab bars.  Shower chair.    COMPREHENSION: Auditory comprehension is the usual mode. Comprehension Score =  7, Independent.  Patient comprehends complex/abstract information in their  primary language.  Patient is completely independent for auditory comprehension.   There are no activity limitations.    EXPRESSION: Vocal expression is the usual mode. Expression Score = 7,  Independent.  Patient expresses complex/abstract information in their primary  language.  Patient is completely independent for vocal expression.  There are no  activity limitations.    SOCIAL INTERACTION: Social Interaction Score = 7, Independent. Patient is  completely independent for social interaction.  There are no activity  limitations.    PROBLEM SOLVING: Problem Solving Score = 7, Independent.  Patient makes  appropriate decisions in order to solve complex problems.  Patient is completely  independent for problem solving.  There are no activity limitations.    MEMORY: Memory Score = 7, Independent.  Patient is completely independent for  memory.  There are no activity limitations.    Today's Treatment Interventions:       Therapeutic Activities:  Treatment focused on completion of ADL routine.  Patient left in [facility] w/ call button in reach.    ASSESSMENT  Summary of Progress and Current Status: Patient is progressing towards MOD I w/  ADL routine but continues to require supervision/set-up assist w/ most ADLs and  MIN A w/ LB dressing. Patient should continue w/ OT treatment at SNF to help  patient reach MOD I w/ ADLs and mobility.    Progress Towards Goals (final status):   LONG TERM GOAL REVIEW:       1. Patient will perform toileting transfer and task with MOD I. - Not Met:  (requires SBA)       2. Patient  will perform UB dressing with MOD I and LB dressing with PRN AE  and SBA. - Not Met (requires SBA w/ UB and MIN A w/ LB)        3. Patient will stand for at least 3 minutes in order to assist with  standing ADL tasks. - Not Met (limited standing tolerance)       4. Patient will perform bathing transfer and task with SBA. - Met    PLAN  Occupational Therapy Plan: Patient is recommended for sub-acute therapy  services.    Team Care Plan  Please review Integrated Patient View Care Plan Flowsheet for Team identified  Problems, Interventions, and Goals.    Identified problems from team documentation:  Problem: Impaired Mobility  Mobility: Primary Team Goal: Pt will be indep with all bed mobility and  transfers with LRAD, indep with walking at least 44ft iwth LRAD, mod-max A for 4  steps with LRAD and HR/Active    Problem: Impaired Pain Management  Pain Mgmt: Primary Team Goal: Team will collaborate and work efficiently and  effectively in achieving manageable pain levels while participating in acute  rehab./    Problem: Impaired Self-care  Mgmt/ADL/IADL  Self Care: Primary Team Goal: Patient will perform toileting transfer and task  with MOD I.  Caregiver will demo safe supervision for bathing and LB dressing  according to family training./Active    Status update for discharge:     Self Care Management:   Primary Goal: Not Met   Discharge Status Comment: Patient requires MIN A w/ LB dressing and SBA w/ all  other ADLs.    3 Hour Rule Minutes: 60 minutes of OT treatment this session count towards  intensity and duration of therapy requirement. Patient was seen for the full  scheduled time of OT treatment this session.    Signed by: Alita Chyle, OTR/L 10/14/2013 9:00:00 AM

## 2013-10-14 NOTE — Rehab Progress Note ([facility]) (Signed)
NAMETEOFILA BOWERY  MRN: [id number]  Account: [id number]  Session Start: 10/14/2013 12:00:00 AM  Session Stop: 10/14/2013 12:00:00 AM    SEVERE SEPSIS SCREEN  INFECTION:  Patient has documented infection. Is on antibiotic therapy (not  prophylaxis).    SYSTEMATIC IMFLAMMATORY RESPONSE SYNDROME (SIRS):  Patient  has no indication of  systematic inflammatory response syndrome (SIRS). Negative Sepsis Screen.  If you are unable to assess a system's dysfunction because you do not have labs,  or the labs you have are not current (within 24 hours), call physician and  request and order for the lab tests needed.    Signed by: French Ana, RN 10/14/2013 7:49:00 AM

## 2013-10-14 NOTE — Rehab Discharge Summary ([facility]) (Signed)
[name]  MRN: [id number]  Account: [id number]  Session Start: 10/14/2013 12:00:00 AM  Session Stop: 10/14/2013 12:00:00 AM    Rehabilitation Nursing  Inpatient Rehabilitation Discharge Summary    Rehab Diagnosis: Gait Disturbance  Demographics:            Age: 78Y            Gender: Female  Primary Language: english    Date of Onset:  09-05-13  Date of Admission: 10/03/2013 6:43:00 PM    Rehabilitation Precautions Restrictions:   Fall, skin, 10% WB RLE  Maintain R knee extension AAT    Discharge:  Patient discharged to:   Skilled Nursing Facility    VITAL SIGNS  Blood Pressure: 135/68 mmHg  Temperature:  degrees  Pulse: 74 beats per minute  Respirations: 18 breaths per minute  Pain: 4/10    WEIGHT and NUTRITION  Admission Weight: 0 pounds; Current Weight: 246pounds  Weight Change since Admit: Patient gained 246 pounds since admission.  Food Consistency: Regular  Liquid Consistency:Thin    Patient Report: " I need pain med".  Pain: Patient currently has pain.  Location: RT KNEE  Type: Acute  Quality: Aching.  Pain Scale: Numeric.  Patient reports a pain level of 8 out of 10.  Patient's acceptable level of pain 1 out of 10.   Interferes with physical activity.  Pain is alleviated by: PAIN MED  Pain is exacerbated by: PT Patient medicated.  Pain Reassessment:  Response to Pain Intervention: "BETTER".  Post Intervention Pain Quality:  Tender.  Patient Reports Post Intervention Pain Level of: 1 out of 10  Pain Acceptable: Yes  Patient/Caregiver Goals:  I want to be as independent as possible    SEVERE SEPSIS SCREEN  INFECTION:  Patient has documented infection. Is on antibiotic therapy (not  prophylaxis).    SYSTEMATIC IMFLAMMATORY RESPONSE SYNDROME (SIRS):  Patient  has no indication of  systematic inflammatory response syndrome (SIRS). Negative Sepsis Screen.  If you are unable to assess a system's dysfunction because you do not have labs,  or the labs you have are not current (within 24 hours), call physician  and  request and order for the lab tests needed.    NEURO  Orientation/Awareness: Alert and Oriented x3.  Speech: Clear.  Behavior: Cooperative.    MEDICATIONS  IV Access: No IV access.  Dialysis Access: Patient does not have dialysis access.    Elopement Risk Level Assessment Tool  Patient Criteria: Patient is not capable of leaving the unit.  Assessment is not  applicable.    RISK ASSESSMENT FOR FALLS/INJURY    MENTAL STATUS CRITERIA:   0 - None identified.  MENTAL STATUS TOTAL: 0    AGE CRITERIA:   78 - 78-50 years old  AGE TOTAL: 78    ELIMINATION CRITERIA:   3 - Toileting with Assistance.   ELIMINATION TOTAL: 3    HISTORY OF FALLS CRITERIA:   2 - Unknown History.  HISTORY OF FALLS TOTAL: 2    MEDICATIONS CRITERIA:   2 or more High Risk Medications (*see list below)   MEDICATIONS TOTAL: 2    PHYSICAL MOBILITY CRITERIA:   3 - Decreased balance reaction.   1 - Weakness/impaired physical mobility.   PHYSICAL MOBILITY TOTAL: 4    FALLS RISK ASSESSMENT TOTAL: 12    Patient's Fall Risk: No Risk Level found for this score.    Falls Interventions: Clutter removed and clear path to BR.  Call bell, phone, glasses,  etc within reach.  Hourly toileting/safety checks between 6am and 10pm, then every 2 hours.  Initiate Fall care plan and outcome.  Yellow "high risk" patient identification in place: wrist band, socks, chart  sticker, door sign.  Pt and family education.  Assistive devices at [facility]  Activity basket or distraction.  Pharmacy review of meds.  Bed alarm    NUTRITION  Diet:  Type: Regular.  Food Consistency: Regular.  Liquid Consistency: Thin.    CARDIOVASCULAR     Right Lower Extremity  Nail Bed Color: Pink.   EDEMA AROUND INCISION SITE   Left Lower Extremity  Nail Bed Color: Pink.   No edema or redness present.  Pulses:   Apical Pulse: Regular. Strong. Rate is .   Patient does not have a pacemaker.   Patient does not have a defibrillator.    CARDIOPULMONARY  Lung Sounds:   Upper lobes. Clear.   Lower lobes.  Clear.  Type of Respirations: Regular.  Cough: No cough noted.  Respiratory Support: The patient does not require any respiratory support.  Respiratory Equipment: None. 96%    INTEGUMENTARY  Skin:  Temperature: Warm  Turgor: Normal for age  Moisture: Dry  Color of skin: Normal for Race/Ethnicity  Capillary Refill: Less than 3 seconds  Wounds/Incisions:     Wound/incision not assessed this shift.   CHANGED BY NIGHT SHIFT NURSE  Braden Scale for Predicting Pressure Sore Risk: Sensory Perception: Slightly  limited  Moisture: Occasionally moist  Activity: Walks occasionally  Mobility: Slightly limited  Nutrition: Adequate  Friction and Shear: No apparent problem  Braden Score: 18  Level of Risk: At risk (15-18)   Pressure Relief every 30 minutes while in wheelchair   Turn patient every 2 hours   Assist patient to the bathroom every 2 hours    GASTROINTESTINAL  Abdomen: Soft. Nontender.  Bowel Sounds:  Active bowel sounds audible in all four quadrants.  Date of Last Bowel Movement:  10/13/13   No Problems/Complaints with Bowel Elimination Assessed.    GENITOURINARY  Current Bladder Pattern: Continent  Color:  Yellow Clear   Patient denies problems with urination and/or catheter.    MUSCULOSKELETAL  Upper Body: none  Lower Body: R LE 10% WB, no bending    Functional Measures    EATING: Eating Score = 7. Patient is completely independent for eating.  There  are no activity limitations.    TOILETING: Toileting Score = 6.  Patient is modified independent for toileting,  requiring: Safety considerations.    BLADDER MANAGEMENT - LEVEL OF ASSIST: Bladder Score = 7. Patient is completely  independent for bladder management. There are no activity limitations.    BLADDER ACCIDENTS THIS SHIFT:  0 . Patient has not had an accident this  assessment and did not require medications or devices.    BOWEL MANAGEMENT - LEVEL OF ASSIST: Bowel Score = 6. Patient is modified  independent for bowel management requiring: Stool softeners    BOWEL  ACCIDENTS THIS SHIFT: 0 . Patient has not had an accident, but used a  stool softener.    Education Provided:    Education Provided: Precautions. Pain management. Pain scale. Medication  options. Side effects. Clinical indicators of pain. Safety issues and  interventions. Fall protocol. Skin/wound care. Signs/symptoms of infection.  Skin/wound care. Signs/symptoms of infection.       Audience: Patient.       Mode: Explanation.  Demonstration.       Response: Applied knowledge.  Verbalized  understanding.    ASSESSMENT  Long Term Goals (status prior to discharge): Pt and family will be knowledgeable  and independent with scheduled medications to be taken at discharge. - New Goal  2 weeks from 10/03/2013  Short Term Goals (status prior to discharge): Pt states acceptable pain at the R  LE inorder to participate and demonstrate optimal functional mobility with MOD  I. - New Goal  2 weeks from 10/03/2013    Progress Towards Goals (final status): SHORT TERM GOAL REVIEW:       2. Pt states acceptable pain at the R LE inorder to participate and  demonstrate optimal functional mobility with MOD I. - Met   LONG TERM GOAL REVIEW:       2. Pt and family will be knowledgeable and independent with scheduled  medications to be taken at discharge. - Met    PLAN  Recommendations for Follow-Up Care:   Yes, patient received valuables.  Bladder Program:  Bowel Program:  Last Bowel Movement- 10/13/13    Skin: MONITOR SKIN FOR S/S OF INFECTION  Current Diet: REGULAR  Pain Management: Medication. PLEASE TAKE PAIN MED PER DR ORDER BEFORE PAIN  BECOMES SEVERE.    Care Plan  Identified problems from team documentation:  Problem: Impaired Mobility  Mobility: Primary Team Goal: Pt will be indep with all bed mobility and  transfers with LRAD, indep with walking at least 75ft iwth LRAD, mod-max A for 4  steps with LRAD and HR/Active    Problem: Impaired Pain Management  Pain Mgmt: Primary Team Goal: Team will collaborate and work efficiently  and  effectively in achieving manageable pain levels while participating in acute  rehab./    Problem: Impaired Self-care Mgmt/ADL/IADL  Self Care: Primary Team Goal: Patient will perform toileting transfer and task  with MOD I.  Caregiver will demo safe supervision for bathing and LB dressing  according to family training./Not Met    Status update for discharge:      Please review Integrated Patient View Care Plan Flowsheet for Team identified  Problems, Interventions, and Goals.    Signed by: French Ana, RN 10/14/2013 6:30:00 PM

## 2013-10-15 NOTE — Progress Notes (Signed)
Received Berkley Harvey from Jasmine December CM at [employer] for [employer]. Auth # [id number] with start of SNF approval 10/14/13. [facility] accepted pt and insurance.

## 2013-10-17 NOTE — Rehab PPS CMG ([facility]) (Signed)
NAMELEIGHLA CHESTNUTT  MRN: [id number]  Account: [id number]  Session Start: 10/14/2013 12:00:00 AM  Session Stop: 10/14/2013 12:00:00 AM    PPS CMG Coordinator  Inpatient Rehabilitation Discharge    Mode of Locomotion: Wheelchair.    Discharge Against Medical Advice:  No.  Discharge Information: Patient Discharged Alive:  Yes  Discharge Destination/Living Setting: Skilled Nursing Facility    Impairment Group: 08.61 Status Post Unilateral Knee Replacement    Comorbidities:  Rank Code      Description    1    599.0     URINARY TRACT INF NOS  2    041.49    Other and unspecified Escherichia coli (E.coli)  3    263.9     PROTEIN-CAL MALNUT NOS  4    401.9     HYPERTENSION NOS  5    496.      CHRONIC AIRWAY OBSTR NEC  6    716.90    ARTHROPATHY NOS-SITE NOS  7    278.01    MORBID OBESITY  8    V85.41    Body mass index 40.0-44.9, adult  9    V43.65    KNEE REPLACEMENT STATUS  10   724.2     LUMBAGO  11   285.9     ANEMIA NOS  12   780.52    INSOMNIA NOS  13   781.2     ABNORMALITY OF GAIT  14   564.00    CONSTIPATION NOS      ********************************  Complications:  Rank Code      Description    1    599.0     URINARY TRACT INF NOS  2    041.49    Other and unspecified Escherichia coli (E.coli)      ********************************  PAI Bladder Accidents: 0  - Accidents.  Patient used medications/device this  shift.  10/10/2013 8:51:00 PM  Bladder Score = 6. Patient has not had an accident, but uses a  device/medication.  PAI Bowel Accident: 0  - Accidents.  Patient used medications/device this shift.   10/14/2013 6:30:00 PM  Bowel Score = 6. Patient has no accidents, but uses a device/medications.      QUALITY INDICATORS  Pressure Ulcers - Discharge: Unhealed Pressure Ulcer(s) Present on Discharge:  No.  Number of Stage 1 pressure ulcers present on Admission and have completely  closed upon Discharge: 0  Number of Stage 2 pressure ulcers present on Admission and have completely  closed upon Discharge: 0  Number of Stage 3  pressure ulcers present on Admission and have completely  closed upon Discharge: 0  Number of Stage 4 pressure ulcers present on Admission and have completely  closed upon Discharge: 0    [id number].Influenza Vaccine - Discharge: Received in this facility for this year's  influenza vaccination season:  No.  Influenza Vaccine Not Received Due To: Received outside of this facility.    Signed by: Roselyn Bering, DPT, PPS Coordinator 10/14/2013 4:00:00 PM

## 2013-10-20 NOTE — Op Note (Addendum)
FULL OPERATIVE NOTE    Patient Name: [name]  Attending Physician: No att. providers found      Date of Operation:   09/22/2013    Providers Performing:   Lane Hacker, M.D.    Assistant (s): Shirley Muscat, M.D.    Preoperative Diagnosis:   # 1) Right knee periprosthetic joint infection  #2) Right knee spacer  #3) Proximal tibial soft tissue compromise    Postoperative Diagnosis:   # 1) Right knee periprosthetic joint infection  #2) Right knee spacer  #3) Proximal tibial soft tissue compromise    Operative Procedure:   #1) Right knee resection arthroplasty  #2) Exchange of antibiotic spacer  #3) Implantation of drug-delivery device    Anesthesia:   Spinal, epidural    Estimated Blood Loss:   See anesthesia record    Fluids:   See anesthesia record.    Specimens:   None.    Complications:   None, stable to PACU.    Implants Utilized:   Palacos bone cement    Indications:   This is a 78 year old female with a history of a right knee periprosthetic joint infection that was treated with a right knee resection arthroplasty and implantation of a antibiotic impregnated spacer.  She had significant medial proximal tibial soft tissue compromise and a VAC sponge was placed at the time of the resection arthroplasty.  A plastic surgery consult was obtained and plastic surgery has planned for a gastrocnemius flap coverage with possible skin grafting.  An exchange spacer was planned for today simultaneously to allow for a re-dosed antibiotic impregnated spacer.  The risks and benefits of the procedure were discussed at length with the patient and the patient decided to proceed with Right knee revision arthroplasty.      Operative Notes:   Prior to initiation of the operative procedure a surgical time-out was taken and the patient's name, medical record, number, date-of-birth, and operative side was verified with the surgical consent form.  Additionally it was verified that the appropriate peri-operative antibiotic  administration was completed, as well as that radiographs were available and the correct operative instrumentation and implants were available.    After routine preparation and draping of the patient in the total joint operating room, the esmark bandage was utilized to exsanguinate the extremity and the tourniquet inflated.  A midline skin incision was made over the knee joint utilizing the previous surgical incision.  The medial border of the patella, tibial tubercle, and VMO were clearly identified.  A medial parapatellar approach was made to the right knee joint.     A complete synovectomy including the medial and lateral gutters was done.  A medial release was performed using blunt dissection with a cobb elevator and electrocautery.  At this point the cement spacer was removed with an osteotome and rongeur.  The femoral spacer was removed with osteotomes and a rongeur.  No purulence was noted.  The intramedullary nail was removed and placed on the back table in betadine.  A complete debridement of the tibial and femoral canal was completed with curettes, reverse curettes and a high-speed burr.  Cement was removed from the intramedullary canal of the tibia and femur utilizing reverse curettes and pituitary instruments.  A complete posterior synovectomy was done with a laminar spreader in place.        At this point the knee was irrigated with pulse lavage with 9L of antibiotic-impregnated irrigation fluid.  The synthes humeral nail was re-placed into the  femoral canal and the knee was then placed into extension and the nail was brought into the tibial canal.  This was placed to allow for added stability of the knee spacer construct.  At this point a spacer was prepared with 9 grams tobramycin, 9 grams Vancomycin and 3 40-gram bags of palacos cement.  The cement spacer was pressed into the tibial and femoral bony surfaces and the deep joint closed with interrupted suture.  The wound was closed in layers using  vicryl suture, monocryl suture, and skin staples.  The distal incision was kept open for plastic surgery to evaluate and address.  At this point the plastic surgeon (Dr. Campbell Lerner) took over the case for gastrocnemius flap coverage.    The first-assistant was critical to all steps of the operation, including retraction and leg stabilization during exposure and bone preparation, as well as the deep and superficial wound closure.  Dr. Ahmed Prima performed and/or supervised the key portions of this surgical procedure, including evaluation of the bone cuts, reshaping of the bony elements, and insertion of the provisional and final components.    Signed by: Lane Hacker, MD

## 2013-12-23 ENCOUNTER — Ambulatory Visit: Payer: No Typology Code available for payment source | Attending: Cardiovascular Disease

## 2013-12-23 DIAGNOSIS — T8189XA Other complications of procedures, not elsewhere classified, initial encounter: Secondary | ICD-10-CM | POA: Insufficient documentation

## 2013-12-23 DIAGNOSIS — I10 Essential (primary) hypertension: Secondary | ICD-10-CM | POA: Insufficient documentation

## 2013-12-23 DIAGNOSIS — Y834 Other reconstructive surgery as the cause of abnormal reaction of the patient, or of later complication, without mention of misadventure at the time of the procedure: Secondary | ICD-10-CM | POA: Insufficient documentation

## 2013-12-23 DIAGNOSIS — Z7982 Long term (current) use of aspirin: Secondary | ICD-10-CM | POA: Insufficient documentation

## 2013-12-23 DIAGNOSIS — J4489 Other specified chronic obstructive pulmonary disease: Secondary | ICD-10-CM | POA: Insufficient documentation

## 2013-12-23 DIAGNOSIS — J449 Chronic obstructive pulmonary disease, unspecified: Secondary | ICD-10-CM | POA: Insufficient documentation

## 2013-12-30 ENCOUNTER — Ambulatory Visit: Payer: No Typology Code available for payment source | Attending: Cardiovascular Disease

## 2013-12-30 DIAGNOSIS — T8189XA Other complications of procedures, not elsewhere classified, initial encounter: Secondary | ICD-10-CM | POA: Insufficient documentation

## 2013-12-30 DIAGNOSIS — Z7982 Long term (current) use of aspirin: Secondary | ICD-10-CM | POA: Insufficient documentation

## 2013-12-30 DIAGNOSIS — J449 Chronic obstructive pulmonary disease, unspecified: Secondary | ICD-10-CM | POA: Insufficient documentation

## 2013-12-30 DIAGNOSIS — T8189XD Other complications of procedures, not elsewhere classified, subsequent encounter: Secondary | ICD-10-CM

## 2013-12-30 DIAGNOSIS — Y834 Other reconstructive surgery as the cause of abnormal reaction of the patient, or of later complication, without mention of misadventure at the time of the procedure: Secondary | ICD-10-CM | POA: Insufficient documentation

## 2013-12-30 DIAGNOSIS — I10 Essential (primary) hypertension: Secondary | ICD-10-CM | POA: Insufficient documentation

## 2013-12-30 DIAGNOSIS — J4489 Other specified chronic obstructive pulmonary disease: Secondary | ICD-10-CM | POA: Insufficient documentation

## 2014-01-20 ENCOUNTER — Ambulatory Visit: Payer: No Typology Code available for payment source | Attending: Cardiovascular Disease

## 2014-01-20 DIAGNOSIS — T8189XA Other complications of procedures, not elsewhere classified, initial encounter: Secondary | ICD-10-CM | POA: Insufficient documentation

## 2014-01-20 DIAGNOSIS — T8189XD Other complications of procedures, not elsewhere classified, subsequent encounter: Secondary | ICD-10-CM

## 2014-01-20 DIAGNOSIS — J449 Chronic obstructive pulmonary disease, unspecified: Secondary | ICD-10-CM | POA: Insufficient documentation

## 2014-01-20 DIAGNOSIS — Z7982 Long term (current) use of aspirin: Secondary | ICD-10-CM | POA: Insufficient documentation

## 2014-01-20 DIAGNOSIS — Y834 Other reconstructive surgery as the cause of abnormal reaction of the patient, or of later complication, without mention of misadventure at the time of the procedure: Secondary | ICD-10-CM | POA: Insufficient documentation

## 2014-01-20 DIAGNOSIS — I10 Essential (primary) hypertension: Secondary | ICD-10-CM | POA: Insufficient documentation

## 2014-01-20 DIAGNOSIS — J4489 Other specified chronic obstructive pulmonary disease: Secondary | ICD-10-CM | POA: Insufficient documentation

## 2014-01-20 MED ORDER — MUPIROCIN CALCIUM 2 % EX CREA
TOPICAL_CREAM | Freq: Every day | CUTANEOUS | Status: DC
Start: 2014-01-20 — End: 2014-03-31

## 2014-01-27 ENCOUNTER — Ambulatory Visit: Payer: No Typology Code available for payment source | Attending: Cardiovascular Disease

## 2014-01-27 DIAGNOSIS — I10 Essential (primary) hypertension: Secondary | ICD-10-CM | POA: Insufficient documentation

## 2014-01-27 DIAGNOSIS — T8189XA Other complications of procedures, not elsewhere classified, initial encounter: Secondary | ICD-10-CM | POA: Insufficient documentation

## 2014-01-27 DIAGNOSIS — T8189XD Other complications of procedures, not elsewhere classified, subsequent encounter: Secondary | ICD-10-CM

## 2014-01-27 DIAGNOSIS — M1990 Unspecified osteoarthritis, unspecified site: Secondary | ICD-10-CM | POA: Insufficient documentation

## 2014-01-27 DIAGNOSIS — J4489 Other specified chronic obstructive pulmonary disease: Secondary | ICD-10-CM | POA: Insufficient documentation

## 2014-01-27 DIAGNOSIS — J449 Chronic obstructive pulmonary disease, unspecified: Secondary | ICD-10-CM | POA: Insufficient documentation

## 2014-01-27 DIAGNOSIS — Z7982 Long term (current) use of aspirin: Secondary | ICD-10-CM | POA: Insufficient documentation

## 2014-01-27 DIAGNOSIS — Y834 Other reconstructive surgery as the cause of abnormal reaction of the patient, or of later complication, without mention of misadventure at the time of the procedure: Secondary | ICD-10-CM | POA: Insufficient documentation

## 2014-02-03 ENCOUNTER — Ambulatory Visit: Payer: No Typology Code available for payment source

## 2014-02-10 ENCOUNTER — Ambulatory Visit: Payer: No Typology Code available for payment source | Attending: Cardiovascular Disease

## 2014-02-10 DIAGNOSIS — J4489 Other specified chronic obstructive pulmonary disease: Secondary | ICD-10-CM | POA: Insufficient documentation

## 2014-02-10 DIAGNOSIS — T8189XD Other complications of procedures, not elsewhere classified, subsequent encounter: Secondary | ICD-10-CM

## 2014-02-10 DIAGNOSIS — T8189XA Other complications of procedures, not elsewhere classified, initial encounter: Secondary | ICD-10-CM | POA: Insufficient documentation

## 2014-02-10 DIAGNOSIS — I10 Essential (primary) hypertension: Secondary | ICD-10-CM | POA: Insufficient documentation

## 2014-02-10 DIAGNOSIS — Y834 Other reconstructive surgery as the cause of abnormal reaction of the patient, or of later complication, without mention of misadventure at the time of the procedure: Secondary | ICD-10-CM | POA: Insufficient documentation

## 2014-02-10 DIAGNOSIS — J449 Chronic obstructive pulmonary disease, unspecified: Secondary | ICD-10-CM | POA: Insufficient documentation

## 2014-02-10 DIAGNOSIS — Z7982 Long term (current) use of aspirin: Secondary | ICD-10-CM | POA: Insufficient documentation

## 2014-02-24 ENCOUNTER — Ambulatory Visit: Payer: No Typology Code available for payment source | Attending: Cardiovascular Disease

## 2014-02-24 DIAGNOSIS — T8189XD Other complications of procedures, not elsewhere classified, subsequent encounter: Secondary | ICD-10-CM

## 2014-02-24 DIAGNOSIS — J449 Chronic obstructive pulmonary disease, unspecified: Secondary | ICD-10-CM | POA: Insufficient documentation

## 2014-02-24 DIAGNOSIS — T8189XA Other complications of procedures, not elsewhere classified, initial encounter: Secondary | ICD-10-CM | POA: Insufficient documentation

## 2014-02-24 DIAGNOSIS — Z7982 Long term (current) use of aspirin: Secondary | ICD-10-CM | POA: Insufficient documentation

## 2014-02-24 DIAGNOSIS — J4489 Other specified chronic obstructive pulmonary disease: Secondary | ICD-10-CM | POA: Insufficient documentation

## 2014-02-24 DIAGNOSIS — Y834 Other reconstructive surgery as the cause of abnormal reaction of the patient, or of later complication, without mention of misadventure at the time of the procedure: Secondary | ICD-10-CM | POA: Insufficient documentation

## 2014-02-24 DIAGNOSIS — I10 Essential (primary) hypertension: Secondary | ICD-10-CM | POA: Insufficient documentation

## 2014-03-10 ENCOUNTER — Ambulatory Visit: Payer: No Typology Code available for payment source | Attending: Cardiovascular Disease

## 2014-03-10 DIAGNOSIS — J449 Chronic obstructive pulmonary disease, unspecified: Secondary | ICD-10-CM

## 2014-03-10 DIAGNOSIS — Y834 Other reconstructive surgery as the cause of abnormal reaction of the patient, or of later complication, without mention of misadventure at the time of the procedure: Secondary | ICD-10-CM | POA: Insufficient documentation

## 2014-03-10 DIAGNOSIS — J4489 Other specified chronic obstructive pulmonary disease: Secondary | ICD-10-CM | POA: Insufficient documentation

## 2014-03-10 DIAGNOSIS — IMO0002 Reserved for concepts with insufficient information to code with codable children: Secondary | ICD-10-CM | POA: Insufficient documentation

## 2014-03-10 DIAGNOSIS — T8189XD Other complications of procedures, not elsewhere classified, subsequent encounter: Secondary | ICD-10-CM

## 2014-03-10 DIAGNOSIS — L98499 Non-pressure chronic ulcer of skin of other sites with unspecified severity: Secondary | ICD-10-CM

## 2014-03-10 DIAGNOSIS — I10 Essential (primary) hypertension: Secondary | ICD-10-CM | POA: Insufficient documentation

## 2014-03-10 DIAGNOSIS — T8189XA Other complications of procedures, not elsewhere classified, initial encounter: Secondary | ICD-10-CM | POA: Insufficient documentation

## 2014-03-10 DIAGNOSIS — Z7982 Long term (current) use of aspirin: Secondary | ICD-10-CM | POA: Insufficient documentation

## 2014-03-12 ENCOUNTER — Other Ambulatory Visit: Payer: Self-pay | Admitting: Orthopaedic Surgery

## 2014-03-12 ENCOUNTER — Ambulatory Visit
Admission: RE | Admit: 2014-03-12 | Discharge: 2014-03-12 | Disposition: A | Discharge status: Home / Self Care | Payer: No Typology Code available for payment source | Source: Ambulatory Visit | Attending: Orthopaedic Surgery | Admitting: Orthopaedic Surgery

## 2014-03-12 DIAGNOSIS — Y831 Surgical operation with implant of artificial internal device as the cause of abnormal reaction of the patient, or of later complication, without mention of misadventure at the time of the procedure: Secondary | ICD-10-CM | POA: Insufficient documentation

## 2014-03-12 DIAGNOSIS — T8450XA Infection and inflammatory reaction due to unspecified internal joint prosthesis, initial encounter: Secondary | ICD-10-CM

## 2014-03-12 DIAGNOSIS — Z9660 Presence of unspecified orthopedic joint implant: Secondary | ICD-10-CM | POA: Insufficient documentation

## 2014-03-17 ENCOUNTER — Other Ambulatory Visit: Payer: Self-pay | Admitting: Plastic Surgery

## 2014-03-17 ENCOUNTER — Ambulatory Visit: Payer: No Typology Code available for payment source | Attending: Cardiovascular Disease

## 2014-03-17 DIAGNOSIS — T8189XA Other complications of procedures, not elsewhere classified, initial encounter: Secondary | ICD-10-CM | POA: Insufficient documentation

## 2014-03-17 DIAGNOSIS — J4489 Other specified chronic obstructive pulmonary disease: Secondary | ICD-10-CM | POA: Insufficient documentation

## 2014-03-17 DIAGNOSIS — T8189XD Other complications of procedures, not elsewhere classified, subsequent encounter: Secondary | ICD-10-CM

## 2014-03-17 DIAGNOSIS — Y834 Other reconstructive surgery as the cause of abnormal reaction of the patient, or of later complication, without mention of misadventure at the time of the procedure: Secondary | ICD-10-CM | POA: Insufficient documentation

## 2014-03-17 DIAGNOSIS — Z7982 Long term (current) use of aspirin: Secondary | ICD-10-CM | POA: Insufficient documentation

## 2014-03-17 DIAGNOSIS — I10 Essential (primary) hypertension: Secondary | ICD-10-CM | POA: Insufficient documentation

## 2014-03-17 DIAGNOSIS — J449 Chronic obstructive pulmonary disease, unspecified: Secondary | ICD-10-CM | POA: Insufficient documentation

## 2014-03-17 MED ORDER — DAKINS (1/4 STRENGTH) 0.125 % EX SOLN
Freq: Once | CUTANEOUS | Status: AC
Start: 2014-03-17 — End: 2014-03-17

## 2014-03-18 ENCOUNTER — Inpatient Hospital Stay: Payer: No Typology Code available for payment source | Admitting: Internal Medicine

## 2014-03-18 ENCOUNTER — Emergency Department: Payer: No Typology Code available for payment source

## 2014-03-18 ENCOUNTER — Inpatient Hospital Stay
Admission: EM | Admit: 2014-03-18 | Discharge: 2014-03-31 | DRG: 862 | Disposition: A | Discharge status: Skilled Nursing Facility | Payer: No Typology Code available for payment source | Attending: Internal Medicine | Admitting: Internal Medicine

## 2014-03-18 DIAGNOSIS — I2789 Other specified pulmonary heart diseases: Secondary | ICD-10-CM | POA: Diagnosis present

## 2014-03-18 DIAGNOSIS — K5900 Constipation, unspecified: Secondary | ICD-10-CM | POA: Diagnosis present

## 2014-03-18 DIAGNOSIS — T8140XA Infection following a procedure, unspecified, initial encounter: Principal | ICD-10-CM | POA: Diagnosis present

## 2014-03-18 DIAGNOSIS — A409 Streptococcal sepsis, unspecified: Secondary | ICD-10-CM | POA: Diagnosis present

## 2014-03-18 DIAGNOSIS — T8459XS Infection and inflammatory reaction due to other internal joint prosthesis, sequela: Secondary | ICD-10-CM

## 2014-03-18 DIAGNOSIS — E876 Hypokalemia: Secondary | ICD-10-CM | POA: Diagnosis present

## 2014-03-18 DIAGNOSIS — L02419 Cutaneous abscess of limb, unspecified: Secondary | ICD-10-CM | POA: Diagnosis present

## 2014-03-18 DIAGNOSIS — T8459XA Infection and inflammatory reaction due to other internal joint prosthesis, initial encounter: Secondary | ICD-10-CM

## 2014-03-18 DIAGNOSIS — G609 Hereditary and idiopathic neuropathy, unspecified: Secondary | ICD-10-CM | POA: Diagnosis present

## 2014-03-18 DIAGNOSIS — Y831 Surgical operation with implant of artificial internal device as the cause of abnormal reaction of the patient, or of later complication, without mention of misadventure at the time of the procedure: Secondary | ICD-10-CM | POA: Diagnosis present

## 2014-03-18 DIAGNOSIS — D72829 Elevated white blood cell count, unspecified: Secondary | ICD-10-CM | POA: Diagnosis present

## 2014-03-18 DIAGNOSIS — J4489 Other specified chronic obstructive pulmonary disease: Secondary | ICD-10-CM | POA: Diagnosis present

## 2014-03-18 DIAGNOSIS — R509 Fever, unspecified: Secondary | ICD-10-CM | POA: Diagnosis present

## 2014-03-18 DIAGNOSIS — B999 Unspecified infectious disease: Secondary | ICD-10-CM

## 2014-03-18 DIAGNOSIS — Z87891 Personal history of nicotine dependence: Secondary | ICD-10-CM

## 2014-03-18 DIAGNOSIS — A419 Sepsis, unspecified organism: Secondary | ICD-10-CM | POA: Diagnosis present

## 2014-03-18 DIAGNOSIS — L03119 Cellulitis of unspecified part of limb: Secondary | ICD-10-CM | POA: Diagnosis present

## 2014-03-18 DIAGNOSIS — S81801A Unspecified open wound, right lower leg, initial encounter: Secondary | ICD-10-CM

## 2014-03-18 DIAGNOSIS — T8450XA Infection and inflammatory reaction due to unspecified internal joint prosthesis, initial encounter: Secondary | ICD-10-CM | POA: Diagnosis present

## 2014-03-18 DIAGNOSIS — D649 Anemia, unspecified: Secondary | ICD-10-CM | POA: Diagnosis present

## 2014-03-18 DIAGNOSIS — M62838 Other muscle spasm: Secondary | ICD-10-CM | POA: Diagnosis not present

## 2014-03-18 DIAGNOSIS — J449 Chronic obstructive pulmonary disease, unspecified: Secondary | ICD-10-CM | POA: Diagnosis present

## 2014-03-18 DIAGNOSIS — Z96659 Presence of unspecified artificial knee joint: Secondary | ICD-10-CM

## 2014-03-18 DIAGNOSIS — T8189XA Other complications of procedures, not elsewhere classified, initial encounter: Secondary | ICD-10-CM | POA: Diagnosis present

## 2014-03-18 DIAGNOSIS — T8189XS Other complications of procedures, not elsewhere classified, sequela: Secondary | ICD-10-CM

## 2014-03-18 DIAGNOSIS — M129 Arthropathy, unspecified: Secondary | ICD-10-CM | POA: Diagnosis present

## 2014-03-18 DIAGNOSIS — I10 Essential (primary) hypertension: Secondary | ICD-10-CM | POA: Diagnosis present

## 2014-03-18 LAB — MAN DIFF ONLY
Band Neutrophils Absolute: 2.79 10*3/uL — ABNORMAL HIGH (ref 0.00–1.00)
Band Neutrophils: 14 %
Basophils Absolute Manual: 0 10*3/uL (ref 0.00–0.20)
Basophils Manual: 0 %
Eosinophils Absolute Manual: 0 10*3/uL (ref 0.00–0.70)
Eosinophils Manual: 0 %
Lymphocytes Absolute Manual: 0.8 10*3/uL (ref 0.50–4.40)
Lymphocytes Manual: 4 %
Monocytes Absolute: 0.6 10*3/uL (ref 0.00–1.20)
Monocytes Manual: 3 %
Neutrophils Absolute Manual: 15.76 10*3/uL — ABNORMAL HIGH (ref 1.80–8.10)
Nucleated RBC: 0 /100 WBC (ref 0–1)
Segmented Neutrophils: 79 %

## 2014-03-18 LAB — PT AND APTT
PT INR: 1.3 — ABNORMAL HIGH (ref 0.9–1.1)
PT: 15.9 s — ABNORMAL HIGH (ref 12.6–15.0)
PTT: 29 s (ref 23–37)

## 2014-03-18 LAB — CBC AND DIFFERENTIAL
Hematocrit: 32.6 % — ABNORMAL LOW (ref 37.0–47.0)
Hgb: 10.3 g/dL — ABNORMAL LOW (ref 12.0–16.0)
MCH: 26.6 pg — ABNORMAL LOW (ref 28.0–32.0)
MCHC: 31.6 g/dL — ABNORMAL LOW (ref 32.0–36.0)
MCV: 84.2 fL (ref 80.0–100.0)
MPV: 11.1 fL (ref 9.4–12.3)
Platelets: 201 10*3/uL (ref 140–400)
RBC: 3.87 10*6/uL — ABNORMAL LOW (ref 4.20–5.40)
RDW: 14 % (ref 12–15)
WBC: 19.95 10*3/uL — ABNORMAL HIGH (ref 3.50–10.80)

## 2014-03-18 LAB — COMPREHENSIVE METABOLIC PANEL
ALT: 16 U/L (ref 0–55)
AST (SGOT): 30 U/L (ref 5–34)
Albumin/Globulin Ratio: 1 (ref 0.9–2.2)
Albumin: 3.5 g/dL (ref 3.5–5.0)
Alkaline Phosphatase: 55 U/L (ref 37–106)
BUN: 22 mg/dL — ABNORMAL HIGH (ref 7–19)
Bilirubin, Total: 0.8 mg/dL (ref 0.2–1.2)
CO2: 29 mEq/L (ref 22–29)
Calcium: 9.3 mg/dL (ref 7.9–10.2)
Chloride: 103 mEq/L (ref 100–111)
Creatinine: 0.9 mg/dL (ref 0.6–1.0)
Globulin: 3.6 g/dL (ref 2.0–3.6)
Glucose: 138 mg/dL — ABNORMAL HIGH (ref 70–100)
Potassium: 2.9 mEq/L — ABNORMAL LOW (ref 3.5–5.1)
Protein, Total: 7.1 g/dL (ref 6.0–8.3)
Sodium: 142 mEq/L (ref 136–145)

## 2014-03-18 LAB — C-REACTIVE PROTEIN: C-Reactive Protein: 14.8 mg/dL — ABNORMAL HIGH (ref 0.0–0.5)

## 2014-03-18 LAB — CELL MORPHOLOGY: Cell Morphology: NORMAL

## 2014-03-18 LAB — GFR: EGFR: >60

## 2014-03-18 LAB — LACTIC ACID, PLASMA: Lactic Acid: 1.7 mmol/L (ref 0.5–2.2)

## 2014-03-18 LAB — SEDIMENTATION RATE: Sed Rate: 44 mm/Hr — ABNORMAL HIGH (ref 0–20)

## 2014-03-18 LAB — LIPASE: Lipase: 8 U/L (ref 8–78)

## 2014-03-18 MED ORDER — TIOTROPIUM BROMIDE MONOHYDRATE 18 MCG IN CAPS
18.0000 ug | ORAL_CAPSULE | Freq: Every morning | RESPIRATORY_TRACT | Status: DC
Start: 2014-03-19 — End: 2014-03-31
  Administered 2014-03-19 – 2014-03-31 (×13): 18 ug via RESPIRATORY_TRACT
  Filled 2014-03-18 (×3): qty 5

## 2014-03-18 MED ORDER — ASPIRIN 325 MG PO TBEC
325.0000 mg | DELAYED_RELEASE_TABLET | Freq: Every day | ORAL | Status: DC
Start: 2014-03-19 — End: 2014-03-31
  Administered 2014-03-19 – 2014-03-31 (×13): 325 mg via ORAL
  Filled 2014-03-18 (×13): qty 1

## 2014-03-18 MED ORDER — GABAPENTIN 300 MG PO CAPS
300.0000 mg | ORAL_CAPSULE | Freq: Two times a day (BID) | ORAL | Status: DC
Start: 2014-03-18 — End: 2014-03-22
  Administered 2014-03-18 – 2014-03-22 (×8): 300 mg via ORAL
  Filled 2014-03-18 (×8): qty 1

## 2014-03-18 MED ORDER — VANCOMYCIN HCL 1000 MG IV SOLR
1000.0000 mg | Freq: Once | INTRAVENOUS | Status: AC
Start: 2014-03-18 — End: 2014-03-19
  Administered 2014-03-18: 1000 mg via INTRAVENOUS
  Filled 2014-03-18: qty 1000

## 2014-03-18 MED ORDER — CLINDAMYCIN PHOSPHATE IN D5W 900 MG/50ML IV SOLN
900.0000 mg | Freq: Three times a day (TID) | INTRAVENOUS | Status: DC
Start: 2014-03-18 — End: 2014-03-22
  Administered 2014-03-18 – 2014-03-22 (×12): 900 mg via INTRAVENOUS
  Filled 2014-03-18 (×15): qty 50

## 2014-03-18 MED ORDER — ACETAMINOPHEN 500 MG PO TABS
1000.0000 mg | ORAL_TABLET | Freq: Once | ORAL | Status: AC
Start: 2014-03-18 — End: 2014-03-18
  Administered 2014-03-18: 1000 mg via ORAL
  Filled 2014-03-18: qty 2

## 2014-03-18 MED ORDER — VANCOMYCIN HCL 1000 MG IV SOLR
1000.0000 mg | Freq: Once | INTRAVENOUS | Status: AC
Start: 2014-03-18 — End: 2014-03-18
  Administered 2014-03-18: 1000 mg via INTRAVENOUS
  Filled 2014-03-18: qty 1000
  Filled 2014-03-18: qty 250

## 2014-03-18 MED ORDER — ACETAMINOPHEN 325 MG PO TABS
650.0000 mg | ORAL_TABLET | Freq: Four times a day (QID) | ORAL | Status: DC | PRN
Start: 2014-03-18 — End: 2014-03-31
  Administered 2014-03-19: 650 mg via ORAL
  Filled 2014-03-18: qty 2

## 2014-03-18 MED ORDER — DAKINS (1/4 STRENGTH) 0.125 % EX SOLN
Freq: Once | CUTANEOUS | Status: DC
Start: 2014-03-18 — End: 2014-03-25
  Filled 2014-03-18: qty 473

## 2014-03-18 MED ORDER — NEBIVOLOL HCL 5 MG PO TABS
5.0000 mg | ORAL_TABLET | Freq: Every day | ORAL | Status: DC
Start: 2014-03-18 — End: 2014-03-31
  Administered 2014-03-19 – 2014-03-30 (×12): 5 mg via ORAL

## 2014-03-18 MED ORDER — TRAMADOL HCL 50 MG PO TABS
50.0000 mg | ORAL_TABLET | Freq: Four times a day (QID) | ORAL | Status: DC
Start: 2014-03-19 — End: 2014-03-19
  Filled 2014-03-18: qty 1

## 2014-03-18 MED ORDER — HYDROCODONE-ACETAMINOPHEN 5-325 MG PO TABS
2.0000 | ORAL_TABLET | Freq: Four times a day (QID) | ORAL | Status: DC | PRN
Start: 2014-03-18 — End: 2014-03-22
  Administered 2014-03-21: 2 via ORAL
  Filled 2014-03-18 (×2): qty 2

## 2014-03-18 MED ORDER — POTASSIUM CHLORIDE CRYS ER 20 MEQ PO TBCR
10.0000 meq | EXTENDED_RELEASE_TABLET | Freq: Every day | ORAL | Status: DC
Start: 2014-03-18 — End: 2014-03-19
  Administered 2014-03-18 – 2014-03-19 (×2): 10 meq via ORAL
  Filled 2014-03-18 (×2): qty 1

## 2014-03-18 MED ORDER — TRAMADOL HCL 50 MG PO TABS
50.0000 mg | ORAL_TABLET | Freq: Four times a day (QID) | ORAL | Status: DC | PRN
Start: 2014-03-18 — End: 2014-03-29
  Administered 2014-03-18 – 2014-03-28 (×8): 50 mg via ORAL
  Filled 2014-03-18 (×8): qty 1

## 2014-03-18 MED ORDER — TAB-A-VITE/BETA CAROTENE PO TABS
1.0000 | ORAL_TABLET | Freq: Every day | ORAL | Status: DC
Start: 2014-03-19 — End: 2014-03-31
  Administered 2014-03-19 – 2014-03-31 (×13): 1 via ORAL
  Filled 2014-03-18 (×13): qty 1

## 2014-03-18 MED ORDER — HYDROCHLOROTHIAZIDE 12.5 MG PO TABS
25.0000 mg | ORAL_TABLET | Freq: Every day | ORAL | Status: DC
Start: 2014-03-19 — End: 2014-03-31
  Administered 2014-03-19 – 2014-03-31 (×13): 25 mg via ORAL
  Filled 2014-03-18 (×13): qty 2

## 2014-03-18 MED ORDER — AMLODIPINE BESYLATE 5 MG PO TABS
5.0000 mg | ORAL_TABLET | Freq: Every day | ORAL | Status: DC
Start: 2014-03-19 — End: 2014-03-31
  Administered 2014-03-19 – 2014-03-31 (×13): 5 mg via ORAL
  Filled 2014-03-18 (×13): qty 1

## 2014-03-18 MED ORDER — HYDROCODONE-ACETAMINOPHEN 5-325 MG PO TABS
1.0000 | ORAL_TABLET | ORAL | Status: DC | PRN
Start: 2014-03-18 — End: 2014-03-22
  Administered 2014-03-18 – 2014-03-22 (×5): 1 via ORAL
  Filled 2014-03-18 (×4): qty 1

## 2014-03-18 MED ORDER — ONDANSETRON HCL 4 MG/2ML IJ SOLN
4.0000 mg | Freq: Four times a day (QID) | INTRAMUSCULAR | Status: DC | PRN
Start: 2014-03-18 — End: 2014-03-31

## 2014-03-18 MED ORDER — POTASSIUM CHLORIDE 10 MEQ/100ML IV SOLN
10.0000 meq | INTRAVENOUS | Status: AC
Start: 2014-03-18 — End: 2014-03-18
  Administered 2014-03-18 (×2): 10 meq via INTRAVENOUS
  Filled 2014-03-18 (×2): qty 100

## 2014-03-18 MED ORDER — SODIUM CHLORIDE 0.9 % IV MBP
1000.0000 mg | INTRAVENOUS | Status: DC
Start: 2014-03-18 — End: 2014-03-31
  Administered 2014-03-18 – 2014-03-30 (×13): 1000 mg via INTRAVENOUS
  Filled 2014-03-18 (×15): qty 1000

## 2014-03-18 MED ORDER — CLONIDINE HCL 0.1 MG PO TABS
0.1000 mg | ORAL_TABLET | Freq: Every day | ORAL | Status: DC
Start: 2014-03-19 — End: 2014-03-31
  Administered 2014-03-19 – 2014-03-30 (×12): 0.1 mg via ORAL
  Filled 2014-03-18 (×13): qty 1

## 2014-03-18 MED ORDER — SODIUM CHLORIDE 0.9 % IV BOLUS
1000.0000 mL | Freq: Once | INTRAVENOUS | Status: AC
Start: 2014-03-18 — End: 2014-03-18
  Administered 2014-03-18: 1000 mL via INTRAVENOUS

## 2014-03-18 NOTE — ED Notes (Signed)
Pt assisted back to bed, unable to void. Dr Nonda Lou notified urine culture has not yet been collected, per Dr Nonda Lou, ok to start abx prior to urine collection.

## 2014-03-18 NOTE — ED Notes (Signed)
Pt assisted to use bedside commode to collect urine spec

## 2014-03-18 NOTE — ED Notes (Signed)
Pt was sent here from MD office for possible septic work up for right knee infection. Pt has wound vac to right knee.

## 2014-03-18 NOTE — Consults (Signed)
INFECTIOUS DISEASES CONSULTATION  [facility]  [phone] [facility] [address] [place]   [address]  [place], [place] [phone]      Date Time: 03/18/2014 8:06 PM  Patient Name: [name]  Requesting Physician: Shela Nevin, MD      Reason for Consultation:   Fever  Sepsis  Cellulitis right leg       Assessment:     Patient Active Problem List   Diagnosis   . Total knee replacement status   . Low back pain   . Chronic obstructive pulmonary disease   . Hypertension   . Debility following multiple R knee revisions   . Non-healing surgical wound- right knee    . Fever     The patient is a 78 y.o. female admitted on 03/18/2014 with sepsis,  Right leg wound infection, cellulitis.    Pre admission wound cx with esbl e. Coli and group c strep    Recommendation:   Blood cxs prior to abx  Invanz and vancomycin  Ortho f/u to evaluate right knee - and   Consider aspiration.  Dr Campbell Lerner following  ?Imaging/ct of right leg     History of Pesent Illness:   [name] is a 78 y.o. female who is well-known  To our service   She had initial right TKR 08/25/13 by Dr  Ahmed Prima.  Due to prolonged drainage, she underwent I and D and poly exchange on   On 09/05/13.  OR cxs + pan-sensitive proteus.  Pt was treated with po   cipro 750 bid.   Unfortunately, bloody drainage persisted and a resection  Arthroplasty c placement of abx spacer was performed.  On 09/15/14  Unfortunately, bloody drainage persisted and wound healing was poor.  Accordingly, pt underwent placement of a new abx spacer and a gastrocnemius  Flap on 09/22/13.  Of note, all cxs since 08/25/13 have been negative.  cipro was discontinued .    Pt has had problems with slow wound healing of the gastroc flap.    She has had ongoing wound vac therapy.    approx 2 wks ago a new wound developed at the apex of the healed.  Total knee incision.  This wound was noted when long adherent   Eschar finally fell off.   Over the last couple of weeks, there was    Increased concern as this wound was becoming larger.    cxs of wound  From 03/10/14 grew esbl e.c oli and group c strep.    Pt was referred to our office today for evaluation and tx of the  Wound infection.    In office today, pt was ill-appearing with rigors.  She had noted malaise,  Fatigue, Fever of up to 101 over last pm  with chilss.  Accordingly, admission and further eval was recommended.        Medications:     Current Facility-Administered Medications   Medication Dose Route Frequency   . ertapenem  1,000 mg Intravenous Q24H SCH   . potassium chloride  10 mEq Intravenous Q1H   . vancomycin (VANCOCIN) 1 g IVPB  1,000 mg Intravenous Once    And   . vancomycin (VANCOCIN) 1 g IVPB  1,000 mg Intravenous Once       Antibiotics:   Invanz and Vancomycin IV    Allergies:   No Known Allergies    Past Medical History:     Past Medical History   Diagnosis Date   .  Hypertension    . Pneumonia    . Chronic obstructive pulmonary disease    . Headache(784.0)      not since surgery in 1957   . Arthritis    . Difficulty in walking(719.7)    . Low back pain        Past Surgical History:     Past Surgical History   Procedure Laterality Date   . Cervical spine surgery     . Lumbar spine surgery  posterior w/ hardware   . Tonsillectomy     . Brain surgery  R side/ 1957 Optic nerve pressure   . Eye surgery       cataracts   . Colonoscopy     . Tubal ligation  1961   . Arthroplasty, knee, total  08/25/2013     Procedure: ARTHROPLASTY, KNEE, TOTAL;  Surgeon: Lane Hacker, MD;  Location: [facility];  Service: Orthopedics;  Laterality: Right;   . Arthroplasty, knee, revision total  09/05/2013     Procedure: ARTHROPLASTY, KNEE, REVISION TOTAL;  Surgeon: Lane Hacker, MD;  Location: [facility];  Service: Orthopedics;  Laterality: Right;  RT KNEE WASHOUT & POLY EXCHANGE **POA/CH** (STRYKER)    . Arthroplasty, knee, resection  09/15/2013     Procedure: ARTHROPLASTY, KNEE, RESECTION;  Surgeon: Lane Hacker, MD;  Location: [facility];  Service: Orthopedics;  Laterality: Right;  AND PLACEMENT OF ANTIBIOTIC SPACER  Placement of wound vac   . Arthroplasty, knee, revision total  09/22/2013     Procedure: ARTHROPLASTY, KNEE, REVISION TOTAL;  Surgeon: Lane Hacker, MD;  Location: [facility];  Service: Orthopedics;  Laterality: Right;   . Debridement, muscle flap closure  09/22/2013     Procedure: DEBRIDEMENT, MUSCLE FLAP CLOSURE;  Surgeon: Ernie Avena, MD;  Location: [facility];  Service: Plastics;  Laterality: Right;  DEBRIDEMENT & MUSCLE FLAP KNEE RT   . Graft, skin  split thickness, (medical)  09/22/2013     Procedure: GRAFT, SKIN  SPLIT THICKNESS, (MEDICAL);  Surgeon: Ernie Avena, MD;  Location: [facility];  Service: Plastics;  Laterality: N/A;       Social History:     History     Social History   . Marital Status: Widowed     Spouse Name: N/A     Number of Children: N/A   . Years of Education: N/A     Social History Main Topics   . Smoking status: Former Smoker -- 1.00 packs/day for 45 years     Quit date: 08/20/1998   . Smokeless tobacco: Never Used   . Alcohol Use: Yes      Comment: occassionally   . Drug Use: No   . Sexual Activity: Not on file     Other Topics Concern   . Not on file     Social History Narrative     No recent travel  No known tick exposure/insect exposure      Family History:   History reviewed. No pertinent family history.  No ill contacts in the family  Review of Systems:   + Fevers, Chills,   Rigors  +right leg pain/swelling - chronic  No HA  No Neck Stiffness  No sore throat, dysphagia, or odynophagia  No cough, Chest Pain, or shortness of breath  No N/V/D  No abdominal pain  No dysuria, frequency, hesitancy, hematuria   No flank pain/No CVAT  No itching or rash  No focal neurologic complaints      Physical Exam:     Filed Vitals:    03/18/14 1710 03/18/14 1820 03/18/14 1951   BP: 111/59 176/87 149/82   Pulse: 76 74 79   Temp: 100.5 F (38.1 C)  99.2 F (37.3 C)   Resp: 24 18  18    Height: 1.6 m (5' 2.99")     Weight: 100.517 kg (221 lb 9.6 oz)     SpO2: 96% 98% 98%       Temp (24hrs), Avg:99.9 F (37.7 C), Min:99.2 F (37.3 C), Max:100.5 F (38.1 C)      peripheral iv    Active PICC Line / CVC Line / PIV Line / Drain / Airway / Intraosseous Line / Epidural Line / ART Line / Line / Wound / Pressure Ulcer / NG/OG Tube    Name:   Placement date:   Placement time:   Site:   Days:    OnQ Pump (Incisional) 08/25/13 knee Right  08/25/13   0939   knee   205    Peripheral IV 09/24/13 Left Hand  09/24/13   0730   Hand   175    Peripheral IV 03/18/14 Right Antecubital  03/18/14   1740   Antecubital   less than 1    Peripheral IV 03/18/14 Right Hand  03/18/14   1920   Hand   less than 1    Closed/Suction Drain Right Knee 10 Fr.  09/05/13   1401   Knee   194    Epidural Catheter              Incision Site 09/05/13 Knee Right  09/05/13   1437     194    Incision Site 09/15/13 Knee Right  09/15/13   1517     184    Incision Site 09/22/13 Leg Right  09/22/13   1901     177          Intake and Output Summary (Last 24 hours) at Date Time  No intake or output data in the 24 hours ending 03/18/14 2006  Ill appearing, had mild rigors but in nad  Well-developed, well-nourished   Sclera anicteric, conjunctivae clear  Oral pharynx s erythema, exudate, or other significant lesions  No Thrush  Neck supple, s lymphadenopathy  Lungs Clear,   Heart Regular rate and rhythm s murmurs, rubs, or gallops  Abdomen Soft, non tender, non distended, normal bowel sounds  Back No Costovertebral angle tenderness, no point tenderness along the spine  Extremities No Cyanosis, no clubbing,   Palpable pedal pulses  +2 edema right leg - right knee incision line is mostly well-healed,  At apex of incision, a 1.4 cm round x 1.4 cm deep wound is noted.  This wound is located in the distal thigh - and probes only to soft-  Tissue.  Min fibrinous exudate is noted;  No odor or purulence.    Warmth and subtle  erythema are noted over  the the right leg.  Wound vac is intact over the grafted area.    Neurologic exam is grossly non-focal  Skin without rash    Labs Reviewed:     Results    Procedure Component Value Units Date/Time    Sedimentation rate (ESR) [[id number]]  (Abnormal) Collected:  03/18/14 1718    Specimen Information:  Blood Updated:  03/18/14 1917     Sed Rate 44 (H) mm/Hr     CBC and  differential [[id number]]  (Abnormal) Collected:  03/18/14 1742    Specimen Information:  Blood / Blood Updated:  03/18/14 1811     WBC 19.95 (H) x10 3/uL      RBC 3.87 (L) x10 6/uL      Hgb 10.3 (L) g/dL      Hematocrit 40.3 (L) %      MCV 84.2 fL      MCH 26.6 (L) pg      MCHC 31.6 (L) g/dL      RDW 14 %      Platelets 201 x10 3/uL      MPV 11.1 fL     Manual Differential [[id number]]  (Abnormal) Collected:  03/18/14 1742     Segmented Neutrophils 79 % Updated:  03/18/14 1811     Band Neutrophils 14 %      Lymphocytes Manual 4 %      Monocytes Manual 3 %      Eosinophils Manual 0 %      Basophils Manual 0 %      Nucleated RBC 0 /100 WBC      Abs Seg Manual 15.76 (H) x10 3/uL      Bands Absolute 2.79 (H) x10 3/uL      Absolute Lymph Manual 0.80 x10 3/uL      Monocytes Absolute 0.60 x10 3/uL      Absolute Eos Manual 0.00 x10 3/uL      Absolute Baso Manual 0.00 x10 3/uL     Cell MorpHology [[id number]] Collected:  03/18/14 1742     Cell Morphology: Normal Updated:  03/18/14 1811    C-reactive Protein (CRP) [[id number]]  (Abnormal) Collected:  03/18/14 1742    Specimen Information:  Blood Updated:  03/18/14 1810     C-Reactive Protein 14.8 (H) mg/dL     Lipase [[id number]] Collected:  03/18/14 1742    Specimen Information:  Blood Updated:  03/18/14 1810     Lipase 8 U/L     GFR [[id number]] Collected:  03/18/14 1742     EGFR >60.0 Updated:  03/18/14 1810    Comprehensive metabolic panel [[id number]]  (Abnormal) Collected:  03/18/14 1742    Specimen Information:  Blood Updated:  03/18/14 1810     Glucose 138 (H) mg/dL      BUN 22 (H) mg/dL      Creatinine 0.9  mg/dL      Sodium 376 mEq/L      Potassium 2.9 (L) mEq/L      Chloride 103 mEq/L      CO2 29 mEq/L      CALCIUM 9.3 mg/dL      Protein, Total 7.1 g/dL      Albumin 3.5 g/dL      AST (SGOT) 30 U/L      ALT 16 U/L      Alkaline Phosphatase 55 U/L      Bilirubin, Total 0.8 mg/dL      Globulin 3.6 g/dL      Albumin/Globulin Ratio 1.0     Rapid influenza A/B antigens [[id number]] Collected:  03/18/14 1742    Specimen Information:  Nasopharyngeal / Nasal Aspirate Updated:  03/18/14 1804    Narrative:      ORDER#: [id number]                                    ORDERED BY: Lovie Macadamia  SOURCE: Nasal Aspirate  COLLECTED:  03/18/14 17:42  ANTIBIOTICS AT COLL.:                                RECEIVED :  03/18/14 17:46  Influenza Rapid Antigen A&B                FINAL       03/18/14 18:04  03/18/14   Negative for Influenza A and B             Reference Range: Negative      PT/APTT [[id number]]  (Abnormal) Collected:  03/18/14 1742     PT 15.9 (H) sec Updated:  03/18/14 1758     PT INR 1.3 (H)      PT Anticoag. Given Within 48 hrs. None      PTT 29 sec     Lactic acid [[id number]] Collected:  03/18/14 1742    Specimen Information:  Blood Updated:  03/18/14 1748     Lactic acid 1.7 mmol/L     Blood culture #1 [[id number]] Collected:  03/18/14 1742    Specimen Information:  Blood, Intravenous Line Updated:  03/18/14 1742    Narrative:      1 BLUE+1 PURPLE    Blood culture #2 [[id number]] Collected:  03/18/14 1742    Specimen Information:  Blood, Intravenous Line Updated:  03/18/14 1742    Narrative:      1 BLUE+1 PURPLE             Recent Labs  Lab 03/18/14  1742   WBC 19.95*   Hgb 10.3*   Hematocrit 32.6*   Platelets 201        Recent Labs  Lab 03/18/14  1742   Sodium 142   Potassium 2.9*   Chloride 103   CO2 29   BUN 22*   Creatinine 0.9   CALCIUM 9.3   Albumin 3.5   Protein, Total 7.1   Bilirubin, Total 0.8   Alkaline Phosphatase 55   ALT 16   AST (SGOT) 30   Glucose 138*       Recent Labs  Lab  03/18/14  1742   PT INR 1.3*            Rads:     Radiology Results (24 Hour)    Procedure Component Value Units Date/Time    Chest AP Portable [[id number]] Collected:  03/18/14 1757    Order Status:  Completed  Updated:  03/18/14 1801    Narrative:      History: fever    Technique: Single Portable View        Findings:  The lungs appear clear.  There is no pneumothorax.  There is moderate cardiomegaly.     The mediastinum is within normal  limits.             Impression:       No active disease is seen in the chest.    Laurena Slimmer, MD   03/18/2014 5:57 PM          Signed by: Ray Church, MD

## 2014-03-19 LAB — COMPREHENSIVE METABOLIC PANEL
ALT: 16 U/L (ref 0–55)
AST (SGOT): 30 U/L (ref 5–34)
Albumin/Globulin Ratio: 0.9 (ref 0.9–2.2)
Albumin: 2.9 g/dL — ABNORMAL LOW (ref 3.5–5.0)
Alkaline Phosphatase: 62 U/L (ref 37–106)
BUN: 22 mg/dL — ABNORMAL HIGH (ref 7–19)
Bilirubin, Total: 0.8 mg/dL (ref 0.2–1.2)
CO2: 26 mEq/L (ref 22–29)
Calcium: 8.3 mg/dL (ref 7.9–10.2)
Chloride: 108 mEq/L (ref 100–111)
Creatinine: 0.9 mg/dL (ref 0.6–1.0)
Globulin: 3.3 g/dL (ref 2.0–3.6)
Glucose: 108 mg/dL — ABNORMAL HIGH (ref 70–100)
Potassium: 3 mEq/L — ABNORMAL LOW (ref 3.5–5.1)
Protein, Total: 6.2 g/dL (ref 6.0–8.3)
Sodium: 143 mEq/L (ref 136–145)

## 2014-03-19 LAB — CBC WITH MANUAL DIFFERENTIAL
Band Neutrophils Absolute: 2.18 10*3/uL — ABNORMAL HIGH (ref 0.00–1.00)
Band Neutrophils: 17 %
Basophils Absolute Manual: 0 10*3/uL (ref 0.00–0.20)
Basophils Manual: 0 %
Cell Morphology: ABNORMAL — AB
Eosinophils Absolute Manual: 0 10*3/uL (ref 0.00–0.70)
Eosinophils Manual: 0 %
Hematocrit: 30 % — ABNORMAL LOW (ref 37.0–47.0)
Hgb: 9.4 g/dL — ABNORMAL LOW (ref 12.0–16.0)
Lymphocytes Absolute Manual: 0.51 10*3/uL (ref 0.50–4.40)
Lymphocytes Manual: 4 %
MCH: 26.6 pg — ABNORMAL LOW (ref 28.0–32.0)
MCHC: 31.3 g/dL — ABNORMAL LOW (ref 32.0–36.0)
MCV: 84.7 fL (ref 80.0–100.0)
MPV: 11.2 fL (ref 9.4–12.3)
Monocytes Absolute: 0.26 10*3/uL (ref 0.00–1.20)
Monocytes Manual: 2 %
Neutrophils Absolute Manual: 9.86 10*3/uL — ABNORMAL HIGH (ref 1.80–8.10)
Nucleated RBC: 0 /100 WBC (ref 0–1)
Platelets: 162 10*3/uL (ref 140–400)
RBC: 3.54 10*6/uL — ABNORMAL LOW (ref 4.20–5.40)
RDW: 14 % (ref 12–15)
Segmented Neutrophils: 77 %
WBC: 12.8 10*3/uL — ABNORMAL HIGH (ref 3.50–10.80)

## 2014-03-19 LAB — ECG 12-LEAD
Atrial Rate: 75 {beats}/min
P Axis: 49 degrees
P-R Interval: 136 ms
Q-T Interval: 418 ms
QRS Duration: 102 ms
QTC Calculation (Bezet): 466 ms
R Axis: 49 degrees
T Axis: 36 degrees
Ventricular Rate: 75 {beats}/min

## 2014-03-19 LAB — URINALYSIS, REFLEX TO MICROSCOPIC EXAM IF INDICATED
Bilirubin, UA: NEGATIVE
Blood, UA: NEGATIVE
Glucose, UA: NEGATIVE
Ketones UA: NEGATIVE
Leukocyte Esterase, UA: NEGATIVE
Nitrite, UA: NEGATIVE
Protein, UR: 30 — AB
Specific Gravity UA: 1.02 (ref 1.001–1.035)
Urine pH: 6 (ref 5.0–8.0)
Urobilinogen, UA: 2 mg/dL (ref 0.2–2.0)

## 2014-03-19 LAB — GFR: EGFR: >60

## 2014-03-19 LAB — C-REACTIVE PROTEIN: C-Reactive Protein: 25.8 mg/dL — ABNORMAL HIGH (ref 0.0–0.5)

## 2014-03-19 LAB — SEDIMENTATION RATE: Sed Rate: 63 mm/Hr — ABNORMAL HIGH (ref 0–20)

## 2014-03-19 MED ORDER — POTASSIUM CHLORIDE CRYS ER 20 MEQ PO TBCR
40.0000 meq | EXTENDED_RELEASE_TABLET | Freq: Two times a day (BID) | ORAL | Status: AC
Start: 2014-03-19 — End: 2014-03-20
  Administered 2014-03-19 – 2014-03-20 (×2): 40 meq via ORAL
  Filled 2014-03-19 (×2): qty 2

## 2014-03-19 MED ORDER — ENOXAPARIN SODIUM 40 MG/0.4ML SC SOLN
40.0000 mg | Freq: Every day | SUBCUTANEOUS | Status: DC
Start: 2014-03-19 — End: 2014-03-31
  Administered 2014-03-19 – 2014-03-31 (×12): 40 mg via SUBCUTANEOUS
  Filled 2014-03-19 (×12): qty 0.4

## 2014-03-19 MED ORDER — DAKINS (1/4 STRENGTH) 0.125 % EX SOLN
Freq: Once | CUTANEOUS | Status: DC
Start: 2014-03-19 — End: 2014-03-19

## 2014-03-19 MED ORDER — DAKINS (1/4 STRENGTH) 0.125 % EX SOLN
Freq: Every day | CUTANEOUS | Status: DC
Start: 2014-03-19 — End: 2014-03-31
  Filled 2014-03-19: qty 473

## 2014-03-19 NOTE — Plan of Care (Signed)
Problem: Health Promotion  Goal: Vaccination Screening  All patients will be screened for current vaccination status on each admission.   Outcome: Completed Date Met:  03/19/14  Goal: Risk control - tobacco abuse  Actions to eliminate or reduce tobacco use.   Outcome: Completed Date Met:  03/19/14  Patient stated she is not a smoker.    Problem: Safety  Goal: Patient will be free from injury during hospitalization  Outcome: Progressing  Knee precautions maintained. Assisted with transfers out of bed to bedside commode. Uses walker and wheelchair at home. Patient and family oriented to environment and instructed on use of call light and telephone. Safety precautions maintained.    Problem: Pain  Goal: Patient's pain/discomfort is manageable  Outcome: Progressing  Patient noted moaning upon arrival to floor. Dr. Ninfa Linden was notified and orders given. Administered Narco 1 tab PO with great effect reported by patient. Declined scheduled Tramadol PO at midnight. Right heel elevated on pillow. SCD on left leg.     Problem: Tissue integrity  Goal: Damaged tissue is healing and protected  Outcome: Not Progressing  Patient admitted from ED at 2030 diagnosed with fever and non healing right knee wound. Recent temperature 99.7. Portable wound vac with dressing intact on right knee. 2x2 gauze and dressing noted on two areas on right inner thigh.  Scheduled IV antibiotics administered. Urine specimen collected as ordered.

## 2014-03-19 NOTE — Progress Notes (Signed)
INFECTIOUS DISEASES PROGRESS NOTE    Date Time: 03/19/2014 8:35 PM  Patient Name: [name],[name]      Assessment and Recommendations:     Patient Active Problem List   Diagnosis   . Total knee replacement status   . Low back pain   . Chronic obstructive pulmonary disease   . Hypertension   . Debility following multiple R knee revisions   . Non-healing surgical wound- right knee    . 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  CX at that time + for  Proteus - tx'd with ciprofloxacin.    Recently pt developed an open wound at apex of  Healed right TKR vertical surgical scar.  (developed   Under a chronic eschar)  And has a blister there as well    Recent cx grew group c strep and esbl e coli.    Pt admitted with fever, sepsis, leukocytosis -    Admission blood cxs - 1/2 from 6/10 has grown  Strep to be identified.      Would continue Invanz and vancomcyin  Add clindamycin for tx strep    Repeat bld cxs to document clearance  Echo         Subjective:   Feels a bit better     Antibiotics:   Invanz and Vancomycin IV 6/10  Clindamycin 6/11    Medications:     Current Facility-Administered Medications   Medication Dose Route Frequency   . amLODIPine  5 mg Oral Daily   . aspirin EC  325 mg Oral Daily   . clindamycin  900 mg Intravenous Q8H SCH   . cloNIDine  0.1 mg Oral Daily   . enoxaparin  40 mg Subcutaneous Daily   . ertapenem  1,000 mg Intravenous Q24H SCH   . gabapentin  300 mg Oral Q12H SCH   . hydrochlorothiazide  25 mg Oral Daily   . multivitamin  1 tablet Oral Daily   . nebivolol  5 mg Oral Daily   . potassium chloride  40 mEq Oral BID   . sodium hypochlorite   Apply externally Once   . sodium hypochlorite   Apply externally Daily   . tiotropium  18 mcg Inhalation QAM       Central Access/Endovascular Hardware:     Active PICC Line / CVC Line / PIV Line / Drain / Airway / Intraosseous Line / Epidural Line / ART Line / Line /  Wound / Pressure Ulcer / NG/OG Tube    Name:   Placement date:   Placement time:   Site:   Days:    OnQ Pump (Incisional) 08/25/13 knee Right  08/25/13   0939   knee   206    Peripheral IV 09/24/13 Left Hand  09/24/13   0730   Hand   176    Peripheral IV 03/18/14 Right Antecubital  03/18/14   1740   Antecubital   1    Peripheral IV 03/18/14 Right Hand  03/18/14   1920   Hand   1    Closed/Suction Drain Right Knee 10 Fr.  09/05/13   1401   Knee   195    Negative Pressure Wound Therapy Knee Right        Knee       Epidural Catheter  Incision Site 09/05/13 Knee Right  09/05/13   1437     195    Incision Site 09/15/13 Knee Right  09/15/13   1517     185    Incision Site 09/22/13 Leg Right  09/22/13   1901     178          Review of Systems:   Patient is without , chills, nausea, vomiting, diarrhea, abdominal pain, cough, dyspnea, headache, rash.  Fever 102 last pm    Physical Exam:     Filed Vitals:    03/19/14 0734 03/19/14 0813 03/19/14 1500 03/19/14 1857   BP: 115/50  129/52 105/55   Pulse: 70  83 84   Temp: 99.5 F (37.5 C)  102 F (38.9 C) 101.8 F (38.8 C)   TempSrc: Oral  Oral Oral   Resp:    20   Height:       Weight:       SpO2: 97% 96% 94% 92%       Temp (24hrs), Avg:100.6 F (38.1 C), Min:99.5 F (37.5 C), Max:102 F (38.9 C)        In no apparent distress  Sclera anicteric, conjunctivae clear  Oral pharynx s erythema, exudate, or other significant lesions  No Thrush  Neck supple, s lymphadenopathy  Lungs Clear  Heart Regular rate and rhythm s murmurs, rubs, or gallops  Abdomen Soft, non tender, non distended, normal bowel sounds  Back No Costovertebral angle tenderness, no point tenderness along the spine  Extremities No Cyanosis, no clubbing,+2 edema right leg  Right  Leg incisional  wound (located on distal thigh) and right leg flap  With intact dressing and wound vac respectively.  Neurologic exam is grossly non-focal  Skin without rash or other significant lesions      Labs:     Results     Procedure Component Value Units Date/Time    Sedimentation rate (ESR) [[id number]]  (Abnormal) Collected:  03/19/14 0741    Specimen Information:  Blood Updated:  03/19/14 0855     Sed Rate 63 (H) mm/Hr     GFR [[id number]] Collected:  03/19/14 0741     EGFR >60.0 Updated:  03/19/14 0834    Comprehensive metabolic panel [[id number]]  (Abnormal) Collected:  03/19/14 0741    Specimen Information:  Blood Updated:  03/19/14 0834     Glucose 108 (H) mg/dL      BUN 22 (H) mg/dL      Creatinine 0.9 mg/dL      Sodium 086 mEq/[name]      Potassium 3.0 ([name]) mEq/[name]      Chloride 108 mEq/[name]      CO2 26 mEq/[name]      CALCIUM 8.3 mg/dL      Protein, Total 6.2 g/dL      Albumin 2.9 ([name]) g/dL      AST (SGOT) 30 U/[name]      ALT 16 U/[name]      Alkaline Phosphatase 62 U/[name]      Bilirubin, Total 0.8 mg/dL      Globulin 3.3 g/dL      Albumin/Globulin Ratio 0.9     C Reactive Protein [[id number]]  (Abnormal) Collected:  03/19/14 0741    Specimen Information:  Blood Updated:  03/19/14 0834     C-Reactive Protein 25.8 (H) mg/dL     CBC WITH MANUAL DIFFERENTIAL [[id number]]  (Abnormal) Collected:  03/19/14 0741     WBC 12.80 (H) x10 3/uL Updated:  03/19/14  0834     RBC 3.54 ([name]) x10 6/uL      Hgb 9.4 ([name]) g/dL      Hematocrit 14.7 ([name]) %      MCV 84.7 fL      MCH 26.6 ([name]) pg      MCHC 31.3 ([name]) g/dL      RDW 14 %      Platelets 162 x10 3/uL      MPV 11.2 fL      Segmented Neutrophils 77 %      Band Neutrophils 17 %      Lymphocytes Manual 4 %      Monocytes Manual 2 %      Eosinophils Manual 0 %      Basophils Manual 0 %      Nucleated RBC 0 /100 WBC      Abs Seg Manual 9.86 (H) x10 3/uL      Bands Absolute 2.18 (H) x10 3/uL      Absolute Lymph Manual 0.51 x10 3/uL      Monocytes Absolute 0.26 x10 3/uL      Absolute Eos Manual 0.00 x10 3/uL      Absolute Baso Manual 0.00 x10 3/uL      Cell Morphology: Abnormal (A)      Hypochromia =1+ (A)     Blood culture #2 [[id number]] Collected:  03/18/14 1742    Specimen Information:  Blood, Intravenous Line Updated:  03/19/14  0800    Narrative:      Gram stain Results called to [id number] by [id number]    .  Readback confirmed, by  [id number] on 03/19/2014 at 07:59  ORDER#: [id number]                                    ORDERED BY: Lovie Macadamia  SOURCE: Blood, Intravenous Line IV                   COLLECTED:  03/18/14 17:42  ANTIBIOTICS AT COLL.:                                RECEIVED :  03/18/14 20:27  Gram stain Results called to [id number] by [id number]    .  Readback confirmed, by [id number] on 03/19/2014 at 07:59  Culture Blood Aerobic and Anaerobic        PRELIM      03/19/14 07:59   +  03/19/14   Anaerobic Blood Culture Positive in less than 24 hrs             Gram Stain Shows: Gram positive cocci in chains             Resembling Streptococcus species             Identification and susceptibility to follow      Urine culture [[id number]] Collected:  03/18/14 2353    Specimen Information:  Urine / Urine, Clean Catch Updated:  03/19/14 0443    UA, Reflex to Microscopic [[id number]]  (Abnormal) Collected:  03/18/14 2353    Specimen Information:  Urine Updated:  03/19/14 0040     Urine Type Clean Catch      Color, UA Yellow      Clarity, UA Clear      Specific Gravity UA 1.020      Urine pH 6.0  Leukocyte Esterase, UA Negative      Nitrite, UA Negative      Protein, UR 30 (A)      Glucose, UA Negative      Ketones UA Negative      Urobilinogen, UA 2.0 mg/dL      Bilirubin, UA Negative      Blood, UA Negative      RBC, UA 6 - 10 (A) /hpf      WBC, UA 0 - 5 /hpf      Squamous Epithelial Cells, Urine 0 - 5 /hpf           Recent Labs  Lab 03/19/14  0741   WBC 12.80*   Hgb 9.4*   Hematocrit 30.0*   Platelets 162        Recent Labs  Lab 03/19/14  0741   Sodium 143   Potassium 3.0*   Chloride 108   CO2 26   BUN 22*   Creatinine 0.9   CALCIUM 8.3   Albumin 2.9*   Protein, Total 6.2   Bilirubin, Total 0.8   Alkaline Phosphatase 62   ALT 16   AST (SGOT) 30   Glucose 108*       Recent Labs  Lab 03/18/14  1742   PT INR 1.3*          Rads:     Radiology Results (24  Hour)    ** No results found for the last 24 hours. **          Signed by: Ray Church, MD

## 2014-03-19 NOTE — Progress Notes (Signed)
Patient medicated  with tylenol  for temp of 102, will continue to moniotr plan of care continues.

## 2014-03-19 NOTE — Progress Notes (Signed)
Wound Note  Ms zunker came into hospital due to fever and chills. She is followed in [facility] by Dr Campbell Lerner for her right knee wound. I switched her home pump to a vac Ulta pump. Dakins 1/4 strength daily to proximal suture line wound as ordered.   Plan   Will change vac tomorrow to keep M-W-F schedule    Renelda Loma RN [facility] # [phone]

## 2014-03-19 NOTE — ED Provider Notes (Signed)
EMERGENCY DEPARTMENT NOTE    Physician/Midlevel provider first contact with patient: 03/18/14 1706        DISCUSSION    Blood cultures obtained.  My count of respiration on arrival was 18.  Fever noted but patient did not meet sepsis criteria until white count returned at 1742 at which point I asked IV abx be given prior to urine as patient unable to give sample. Per Dr. Dan Humphreys ID Pincus Sanes and vanco started. Will admit for further care likely leg wound as etiology.  Dr. Tasia Catchings accepts to [facility] service for further care.  Dr. Dan Humphreys (ID) consulted and will follow.      HISTORY OF PRESENT ILLNESS   Historian:Patient  Translator Used: No    Chief Complaint: Fever       78 y.o. female presents with 1 day of fever and chills with diffuse body aches.  Patient has chronic right leg wounds s/p knee arthroplasty and recent culture of the wound grew ESBL.  Patient subsequently referred to ID today (Dr. Dan Humphreys) who evaluated patient and sent to ED for further eval.  Patient denies HA or neck stiffness.  No CP/SOB/cough.  No abd pain.  No N/V/D.  No dysuria/hematuria. No rash.  No recent travel or sick contacts.    1. Location of symptoms:diffuse  2. Onset of symptoms:1 day  3. What was patient doing when symptoms started (Context):at rest  4. Severity:moderate  5. Timing: constant  6. Activities that worsen symptoms:none  7. Activities that improve symptoms: none  8. Quality: ache  9. Radiation of symptoms:no  10. Associated signs and Symptoms:see above  11. Are symptoms worsening?yes  MEDICAL HISTORY     Past Medical History:  Past Medical History   Diagnosis Date   . Hypertension    . Pneumonia    . Chronic obstructive pulmonary disease    . Headache(784.0)      not since surgery in 1957   . Arthritis    . Difficulty in walking(719.7)    . Low back pain        Past Surgical History:  Past Surgical History   Procedure Laterality Date   . Cervical spine surgery     . Lumbar spine surgery  posterior w/ hardware   . Tonsillectomy      . Brain surgery  R side/ 1957 Optic nerve pressure   . Eye surgery       cataracts   . Colonoscopy     . Tubal ligation  1961   . Arthroplasty, knee, total  08/25/2013     Procedure: ARTHROPLASTY, KNEE, TOTAL;  Surgeon: Lane Hacker, MD;  Location: [facility];  Service: Orthopedics;  Laterality: Right;   . Arthroplasty, knee, revision total  09/05/2013     Procedure: ARTHROPLASTY, KNEE, REVISION TOTAL;  Surgeon: Lane Hacker, MD;  Location: [facility];  Service: Orthopedics;  Laterality: Right;  RT KNEE WASHOUT & POLY EXCHANGE **POA/CH** (STRYKER)    . Arthroplasty, knee, resection  09/15/2013     Procedure: ARTHROPLASTY, KNEE, RESECTION;  Surgeon: Lane Hacker, MD;  Location: [facility];  Service: Orthopedics;  Laterality: Right;  AND PLACEMENT OF ANTIBIOTIC SPACER  Placement of wound vac   . Arthroplasty, knee, revision total  09/22/2013     Procedure: ARTHROPLASTY, KNEE, REVISION TOTAL;  Surgeon: Lane Hacker, MD;  Location: [facility];  Service: Orthopedics;  Laterality: Right;   . Debridement, muscle flap closure  09/22/2013  Procedure: DEBRIDEMENT, MUSCLE FLAP CLOSURE;  Surgeon: Ernie Avena, MD;  Location: [facility];  Service: Plastics;  Laterality: Right;  DEBRIDEMENT & MUSCLE FLAP KNEE RT   . Graft, skin  split thickness, (medical)  09/22/2013     Procedure: GRAFT, SKIN  SPLIT THICKNESS, (MEDICAL);  Surgeon: Ernie Avena, MD;  Location: [facility];  Service: Plastics;  Laterality: N/A;       Social History:  History     Social History   . Marital Status: Widowed     Spouse Name: N/A     Number of Children: N/A   . Years of Education: N/A     Occupational History   . Not on file.     Social History Main Topics   . Smoking status: Former Smoker -- 1.00 packs/day for 45 years     Quit date: 08/20/1998   . Smokeless tobacco: Never Used   . Alcohol Use: Yes      Comment: occassionally   . Drug Use: No   . Sexual Activity: Not on file     Other Topics Concern   .  Not on file     Social History Narrative       Family History:  History reviewed. No pertinent family history.    Outpatient Medication:  Current Discharge Medication List      CONTINUE these medications which have NOT CHANGED    Details   albuterol (PROVENTIL) (2.5 MG/3ML) 0.083% nebulizer solution Take 3 mLs (2.5 mg total) by nebulization every 4 (four) hours as needed for Wheezing.  Qty: 75 mL, Refills: 0      !! amLODIPine (NORVASC) 5 MG tablet Take 5 mg by mouth daily.      aspirin EC 325 MG EC tablet Take 1 tablet (325 mg total) by mouth daily.  Qty: 30 tablet, Refills: 0      cloNIDine (CATAPRES) 0.1 MG tablet Take 1 tablet (0.1 mg total) by mouth daily.  Qty: 30 tablet, Refills: 0      docusate sodium 100 MG Cap Take 100 mg by mouth 2 (two) times daily as needed for Constipation.  Qty: 10 capsule, Refills: 0      gabapentin (NEURONTIN) 300 MG capsule Take 1 capsule (300 mg total) by mouth every 12 (twelve) hours.  Qty: 60 capsule, Refills: 0      hydrochlorothiazide (HYDRODIURIL) 25 MG tablet Take 25 mg by mouth daily.      HYDROcodone-acetaminophen (NORCO) 5-325 MG per tablet Take 1 tablet by mouth every 4 (four) hours as needed.  Qty: 30 tablet, Refills: 0      multivitamin (MULTIVITAMIN) Tab Take 1 tablet by mouth daily.  Qty: 30 tablet, Refills: 0      nebivolol (BYSTOLIC) 5 MG tablet Take 5 mg by mouth daily.      ondansetron (ZOFRAN-ODT) 4 MG disintegrating tablet Take 1 tablet (4 mg total) by mouth every 8 (eight) hours as needed for Nausea.  Qty: 20 tablet, Refills: 0      tiotropium (SPIRIVA) 18 MCG inhalation capsule Place 18 mcg into inhaler and inhale daily.      traMADol (ULTRAM) 50 MG tablet Take 1 tablet (50 mg total) by mouth every 6 (six) hours.  Qty: 30 tablet, Refills: 0      !! amLODIPine (NORVASC) 10 MG tablet Take 1 tablet (10 mg total) by mouth daily.  Qty: 30 tablet, Refills: 0      lidocaine (LIDODERM) 5 %  Place 2 patches onto the skin daily. Remove & Discard patch within 12 hours or  as directed by MD  Qty: 30 patch, Refills: 0      mupirocin (BACTROBAN) 2 % cream Apply topically daily. Apply to affected area daily.  Qty: 30 g, Refills: 5      mupirocin (BACTROBAN) 2 % ointment Apply topically.      polycarbophil (FIBERCON) 625 MG tablet Take 2 tablets (1,250 mg total) by mouth daily.  Qty: 60 tablet, Refills: 0      potassium chloride (K-DUR,KLOR-CON) 10 MEQ tablet Take 1 tablet (10 mEq total) by mouth daily.  Qty: 30 tablet, Refills: 0      Senna (SENOKOT) 8.6 MG Tab Take 2 tablets (17.2 mg total) by mouth nightly as needed.  Qty: 120 each, Refills: 0      traZODone (DESYREL) 100 MG tablet Take 1 tablet (100 mg total) by mouth nightly as needed for Sleep.  Qty: 30 tablet, Refills: 0       !! - Potential duplicate medications found. Please discuss with provider.            REVIEW OF SYSTEMS   Review of Systems   Constitutional: Positive for fever and chills.   HENT: Negative for congestion.    Eyes: Negative for blurred vision and double vision.   Respiratory: Negative for cough and shortness of breath.    Cardiovascular: Negative for chest pain and leg swelling.   Gastrointestinal: Negative for nausea, vomiting, abdominal pain and diarrhea.   Genitourinary: Negative for dysuria, hematuria and flank pain.   Musculoskeletal: Negative for back pain and neck pain.   Skin: Negative for itching and rash.   Neurological: Negative for focal weakness, loss of consciousness and headaches.   All other systems reviewed and are negative.       PHYSICAL EXAM   ED Triage Vitals   Enc Vitals Group      BP 03/18/14 1710 111/59 mmHg      Heart Rate 03/18/14 1710 76      Resp Rate 03/18/14 1710 18      Temp 03/18/14 1710 100.5 F (38.1 C)      Temp src --       SpO2 03/18/14 1710 96 %      Weight 03/18/14 1710 100.517 kg      Height 03/18/14 1710 1.6 m      Head Cir --       Peak Flow --       Pain Score 03/18/14 1709 8      Pain Loc --       Pain Edu? --       Excl. in GC? --    Nursing note and vitals  reviewed.  Constitutional:  Well developed, well nourished.  Awake & alert.    Head:  Atraumatic.  Normocephalic.    ENT:  Mucous membranes are moist and intact.  Oropharynx is clear and symmetric.  Patent airway.  Neck:  Supple.  No JVD.  No lymphadenopathy.  Cardiovascular:  Regular rate.  Regular rhythm.   Pulmonary/Chest:  No evidence of respiratory distress.  Clear to auscultation bilaterally.  No wheezing, rales or rhonchi. Chest non-tender.  Abdominal:  Soft and non-distended.  There is no tenderness.  No rebound, guarding, or rigidity.  No bruit. No masses palpable  Back:  No CVA tenderness. FROM.   Extremities:  Right leg wounds.  Proximal wound bandaged.  Distal wound with wound vac in  place.  Some induration to right lower leg without erythema.  No significant discharge.  No cyanosis.  No clubbing.  No calf tenderness. 2+ DP/PT/radial pulses b/l.  Skin:  Skin is warm and dry.  No diaphoresis.    Neurological:  Alert, awake, and appropriate.  Normal speech.  Motor intact b/l UE and LE.  Psychiatric:  Good eye contact.  Normal interaction, affect, and behavior    MEDICAL DECISION MAKING     DISCUSSION    Blood cultures obtained.  My count of respiration on arrival was 18.  Fever noted but patient did not meet sepsis criteria until white count returned at 1742 at which point I asked IV abx be given prior to urine as patient unable to give sample. Per Dr. Dan Humphreys ID Pincus Sanes and vanco started. Will admit for further care likely leg wound as etiology.  Dr. Tasia Catchings accepts to [facility] service for further care.  Dr. Dan Humphreys (ID) consulted and will follow.    Vital Signs: Reviewed the patient?s vital signs.   Nursing Notes: Reviewed and utilized available nursing notes.  Medical Records Reviewed: Reviewed available past medical records.  Counseling: The emergency provider has spoken with the patient and discussed today?s findings, in addition to providing specific details for the plan of care.  Questions are answered and  there is agreement with the plan.      CARDIAC STUDIES    The following cardiac studies were independently interpreted by the Emergency Medicine Physician.  For full cardiac study results please see chart.    Monitor Strip  Interpreted by ED Physician  Rate:75  Rhythm: NSR  ST Changes: none    EKG Interpretation:  Signed and interpreted byED Physician   Time Interpreted: 1729  Comparison: 08/21/13  Rate: 75  Rhythm: NSR  Axis: normal  Intervals: normal  Blocks: no acute changes  ST segments: no acute changes.  Interpretation: Nonspecific  EKG    IMAGING STUDIES    The following imaging studies were independently interpreted by the Emergency Medicine Physician.  For full imaging study results please see chart.  Radiology:  Interpreted by Bethann Berkshire MD (ED Physician)  Study: Chest Xray  Results: No infiltrate. No pneumothorax. No hemothorax. No cardiomegaly. No CHF.  Impression: No acute intrathoracic abnormality.      PULSE OXIMETRY    Oxygen Saturation by Pulse Oximetry: 96%  Interventions: none  Interpretation: normal    EMERGENCY DEPT. MEDICATIONS      ED Medication Orders    Start     Status Ordering Provider    03/18/14 1913  vancomycin (VANCOCIN) 1,000 mg in sodium chloride 0.9 % 250 mL IVPB   Once     Route: Intravenous  Ordered Dose: 1,000 mg     Last MAR action:  New Bag Bethann Berkshire D    03/18/14 1913  vancomycin (VANCOCIN) 1,000 mg in sodium chloride 0.9 % 250 mL IVPB   Once     Route: Intravenous  Ordered Dose: 1,000 mg     Last MAR action:  New Bag Bethann Berkshire D    03/18/14 1912  acetaminophen (TYLENOL) tablet 1,000 mg   Once     Route: Oral  Ordered Dose: 1,000 mg     Last MAR action:  Given Skylar Flynt D    03/18/14 1911  traMADol (ULTRAM) tablet 50 mg   Every 6 hours PRN     Route: Oral  Ordered Dose: 50 mg     Last MAR action:  Given Bethann Berkshire D    03/18/14 1908  potassium chloride 10 mEq in 100 mL IVPB (premix)   Every 1 hour     Route: Intravenous  Ordered Dose: 10 mEq      Last MAR action:  New Bag Devlin Brink D    03/18/14 1730  ertapenem (INVanz) 1,000 mg in sodium chloride 0.9 % 50 mL IVPB mini-bag plus   Every 24 hours scheduled     Route: Intravenous  Ordered Dose: 1,000 mg     Last MAR action:  Stopped Allen Egerton D    03/18/14 1709  sodium chloride 0.9 % bolus 1,000 mL   Once     Route: Intravenous  Ordered Dose: 1,000 mL     Last MAR action:  New Bag Maddock Finigan D          LABORATORY RESULTS    Ordered and independently interpreted AVAILABLE laboratory tests. Please see results section in chart for full details.  Results for orders placed during the hospital encounter of 03/18/14   CBC AND DIFFERENTIAL       Result Value Ref Range    WBC 19.95 (*) 3.50 - 10.80 x10 3/uL    RBC 3.87 (*) 4.20 - 5.40 x10 6/uL    Hgb 10.3 (*) 12.0 - 16.0 g/dL    Hematocrit 16.1 (*) 37.0 - 47.0 %    MCV 84.2  80.0 - 100.0 fL    MCH 26.6 (*) 28.0 - 32.0 pg    MCHC 31.6 (*) 32.0 - 36.0 g/dL    RDW 14  12 - 15 %    Platelets 201  140 - 400 x10 3/uL    MPV 11.1  9.4 - 12.3 fL   COMPREHENSIVE METABOLIC PANEL       Result Value Ref Range    Glucose 138 (*) 70 - 100 mg/dL    BUN 22 (*) 7 - 19 mg/dL    Creatinine 0.9  0.6 - 1.0 mg/dL    Sodium 096  045 - 409 mEq/L    Potassium 2.9 (*) 3.5 - 5.1 mEq/L    Chloride 103  100 - 111 mEq/L    CO2 29  22 - 29 mEq/L    CALCIUM 9.3  7.9 - 10.2 mg/dL    Protein, Total 7.1  6.0 - 8.3 g/dL    Albumin 3.5  3.5 - 5.0 g/dL    AST (SGOT) 30  5 - 34 U/L    ALT 16  0 - 55 U/L    Alkaline Phosphatase 55  37 - 106 U/L    Bilirubin, Total 0.8  0.2 - 1.2 mg/dL    Globulin 3.6  2.0 - 3.6 g/dL    Albumin/Globulin Ratio 1.0  0.9 - 2.2   LACTIC ACID, PLASMA       Result Value Ref Range    Lactic acid 1.7  0.5 - 2.2 mmol/L   LIPASE       Result Value Ref Range    Lipase 8  8 - 78 U/L   PT AND APTT       Result Value Ref Range    PT 15.9 (*) 12.6 - 15.0 sec    PT INR 1.3 (*) 0.9 - 1.1    PT Anticoag. Given Within 48 hrs. None      PTT 29  23 - 37 sec   GFR       Result  Value Ref Range    EGFR >  60.0     SEDIMENTATION RATE       Result Value Ref Range    Sed Rate 44 (*) 0 - 20 mm/Hr   C-REACTIVE PROTEIN       Result Value Ref Range    C-Reactive Protein 14.8 (*) 0.0 - 0.5 mg/dL   MANUAL DIFFERENTIAL       Result Value Ref Range    Segmented Neutrophils 79  None %    Band Neutrophils 14  None %    Lymphocytes Manual 4  None %    Monocytes Manual 3  None %    Eosinophils Manual 0  None %    Basophils Manual 0  None %    Nucleated RBC 0  0 - 1 /100 WBC    Abs Seg Manual 15.76 (*) 1.80 - 8.10 x10 3/uL    Bands Absolute 2.79 (*) 0.00 - 1.00 x10 3/uL    Absolute Lymph Manual 0.80  0.50 - 4.40 x10 3/uL    Monocytes Absolute 0.60  0.00 - 1.20 x10 3/uL    Absolute Eos Manual 0.00  0.00 - 0.70 x10 3/uL    Absolute Baso Manual 0.00  0.00 - 0.20 x10 3/uL   CELL MORPHOLOGY       Result Value Ref Range    Cell Morphology: Normal         CRITICAL CARE/PROCEDURES    Critical Care  Performed by: Bethann Berkshire D  Authorized by: Marcelino Freestone    Critical care provider statement:     Critical care time (minutes):  30    Critical care time was exclusive of:  Separately billable procedures and treating other patients    Critical care was necessary to treat or prevent imminent or life-threatening deterioration of the following conditions:  Sepsis    Critical care was time spent personally by me on the following activities:  Obtaining history from patient or surrogate, development of treatment plan with patient or surrogate, discussions with primary provider, evaluation of patient's response to treatment, examination of patient, discussions with consultants, ordering and review of laboratory studies, ordering and performing treatments and interventions, ordering and review of radiographic studies, re-evaluation of patient's condition and review of old charts     DIAGNOSIS      Diagnosis:  Final diagnoses:   Fever   Leg wound, right, initial encounter       Disposition:  ED Disposition    Admit Admitting  Physician: Marcelino Freestone [[id number]]  Diagnosis: Fever [344092]  Estimated Length of Stay: > or = to 2 midnights  Tentative Discharge Plan?: Home or Self Care [1]  Patient Class: Inpatient [101]  I certify that inpatient services are medically necessary for this patient. Please see H&P and MD progress notes for additional information about the patient's course of treatment. For Medicare patients, services provided in accordance with 412.3 and expected LOS to be greater than 2 midnights for Medicare patients.: Yes            Prescriptions:  Current Discharge Medication List      CONTINUE these medications which have NOT CHANGED    Details   albuterol (PROVENTIL) (2.5 MG/3ML) 0.083% nebulizer solution Take 3 mLs (2.5 mg total) by nebulization every 4 (four) hours as needed for Wheezing.  Qty: 75 mL, Refills: 0      !! amLODIPine (NORVASC) 5 MG tablet Take 5 mg by mouth daily.      aspirin EC 325 MG EC tablet  Take 1 tablet (325 mg total) by mouth daily.  Qty: 30 tablet, Refills: 0      cloNIDine (CATAPRES) 0.1 MG tablet Take 1 tablet (0.1 mg total) by mouth daily.  Qty: 30 tablet, Refills: 0      docusate sodium 100 MG Cap Take 100 mg by mouth 2 (two) times daily as needed for Constipation.  Qty: 10 capsule, Refills: 0      gabapentin (NEURONTIN) 300 MG capsule Take 1 capsule (300 mg total) by mouth every 12 (twelve) hours.  Qty: 60 capsule, Refills: 0      hydrochlorothiazide (HYDRODIURIL) 25 MG tablet Take 25 mg by mouth daily.      HYDROcodone-acetaminophen (NORCO) 5-325 MG per tablet Take 1 tablet by mouth every 4 (four) hours as needed.  Qty: 30 tablet, Refills: 0      multivitamin (MULTIVITAMIN) Tab Take 1 tablet by mouth daily.  Qty: 30 tablet, Refills: 0      nebivolol (BYSTOLIC) 5 MG tablet Take 5 mg by mouth daily.      ondansetron (ZOFRAN-ODT) 4 MG disintegrating tablet Take 1 tablet (4 mg total) by mouth every 8 (eight) hours as needed for Nausea.  Qty: 20 tablet, Refills: 0      tiotropium (SPIRIVA) 18 MCG  inhalation capsule Place 18 mcg into inhaler and inhale daily.      traMADol (ULTRAM) 50 MG tablet Take 1 tablet (50 mg total) by mouth every 6 (six) hours.  Qty: 30 tablet, Refills: 0      !! amLODIPine (NORVASC) 10 MG tablet Take 1 tablet (10 mg total) by mouth daily.  Qty: 30 tablet, Refills: 0      lidocaine (LIDODERM) 5 % Place 2 patches onto the skin daily. Remove & Discard patch within 12 hours or as directed by MD  Qty: 30 patch, Refills: 0      mupirocin (BACTROBAN) 2 % cream Apply topically daily. Apply to affected area daily.  Qty: 30 g, Refills: 5      mupirocin (BACTROBAN) 2 % ointment Apply topically.      polycarbophil (FIBERCON) 625 MG tablet Take 2 tablets (1,250 mg total) by mouth daily.  Qty: 60 tablet, Refills: 0      potassium chloride (K-DUR,KLOR-CON) 10 MEQ tablet Take 1 tablet (10 mEq total) by mouth daily.  Qty: 30 tablet, Refills: 0      Senna (SENOKOT) 8.6 MG Tab Take 2 tablets (17.2 mg total) by mouth nightly as needed.  Qty: 120 each, Refills: 0      traZODone (DESYREL) 100 MG tablet Take 1 tablet (100 mg total) by mouth nightly as needed for Sleep.  Qty: 30 tablet, Refills: 0       !! - Potential duplicate medications found. Please discuss with provider.      STOP taking these medications       sodium hypochlorite (DAKINS QUARTER STRENGTH) 0.125 % Solution Comments:   Reason for Stopping:                   Shela Nevin, MD  03/19/14 [phone]

## 2014-03-19 NOTE — Plan of Care (Signed)
Problem: Health Promotion  Goal: Knowledge - disease process  Extent of understanding conveyed about a specific disease process.   Outcome: Progressing  patient alert x3 new orders reviewed  with patient no further questions asked at this time     Problem: Safety  Goal: Patient will be free from injury during hospitalization  Outcome: Progressing  Safety measures enforced and maintained siderails up x2 call bell within reach rounding by staff     Problem: Pain  Goal: Patient's pain/discomfort is manageable  Outcome: Progressing  Patient  reports pain level  tolerable at 2/10 ,will continue to monitor and maintain pain control     Problem: Psychosocial and Spiritual Needs  Goal: Demonstrates ability to cope with hospitalization/illness  Outcome: Progressing    Problem: Tissue integrity  Goal: Damaged tissue is healing and protected  Outcome: Progressing   Wound vac in place right knee setting at 125 mmhg no drainage noted , dsg x2 anterior right thigh d/i

## 2014-03-19 NOTE — H&P (Addendum)
ADMISSION HISTORY AND PHYSICAL EXAM    Date Time: 03/19/2014 9:45 AM  Patient Name: [name]  Attending Physician: Marcelino Freestone, MD    Assessment:   ESBL positive Chronic Right Leg wound  Fever and Chills  Hypokalemia, unknown etiology  OA/TKA complicated with infection  Hypertension  COPD, stable  Constipation    Patient Active Problem List   Diagnosis   . Total knee replacement status   . Low back pain   . Chronic obstructive pulmonary disease   . Hypertension   . Debility following multiple R knee revisions   . Non-healing surgical wound- right knee    . Fever       Plan:   Continue IV abx. Resume home meds. ID consult. Replace potassium - BID for one day. Monitor labs daily. Full code. Cardiac diet.     History of Present Illness:   [name] is a 78 y.o. female with complicated history of right TKA infection with chronic wound who presents to the hospital with fever and chills. She was sent from Dr Bjorn Pippin office to see ID - Dr Dan Humphreys due to a positive wound cx for ESBL. She only started noticing fever and chills yesterday. Denies any worsening pain of right leg. She was advised by Dr Dan Humphreys to come to ED for admission and treatment with IV abx.     Past Medical History:     Past Medical History   Diagnosis Date   . Hypertension    . Pneumonia    . Chronic obstructive pulmonary disease    . Headache(784.0)      not since surgery in 1957   . Arthritis    . Difficulty in walking(719.7)    . Low back pain        Past Surgical History:     Past Surgical History   Procedure Laterality Date   . Cervical spine surgery     . Lumbar spine surgery  posterior w/ hardware   . Tonsillectomy     . Brain surgery  R side/ 1957 Optic nerve pressure   . Eye surgery       cataracts   . Colonoscopy     . Tubal ligation  1961   . Arthroplasty, knee, total  08/25/2013     Procedure: ARTHROPLASTY, KNEE, TOTAL;  Surgeon: Lane Hacker, MD;  Location: [facility];  Service: Orthopedics;  Laterality: Right;   .  Arthroplasty, knee, revision total  09/05/2013     Procedure: ARTHROPLASTY, KNEE, REVISION TOTAL;  Surgeon: Lane Hacker, MD;  Location: [facility];  Service: Orthopedics;  Laterality: Right;  RT KNEE WASHOUT & POLY EXCHANGE **POA/CH** (STRYKER)    . Arthroplasty, knee, resection  09/15/2013     Procedure: ARTHROPLASTY, KNEE, RESECTION;  Surgeon: Lane Hacker, MD;  Location: [facility];  Service: Orthopedics;  Laterality: Right;  AND PLACEMENT OF ANTIBIOTIC SPACER  Placement of wound vac   . Arthroplasty, knee, revision total  09/22/2013     Procedure: ARTHROPLASTY, KNEE, REVISION TOTAL;  Surgeon: Lane Hacker, MD;  Location: [facility];  Service: Orthopedics;  Laterality: Right;   . Debridement, muscle flap closure  09/22/2013     Procedure: DEBRIDEMENT, MUSCLE FLAP CLOSURE;  Surgeon: Ernie Avena, MD;  Location: [facility];  Service: Plastics;  Laterality: Right;  DEBRIDEMENT & MUSCLE FLAP KNEE RT   . Graft, skin  split thickness, (medical)  09/22/2013  Procedure: GRAFT, SKIN  SPLIT THICKNESS, (MEDICAL);  Surgeon: Ernie Avena, MD;  Location: [facility];  Service: Plastics;  Laterality: N/A;       Family History:   History reviewed. No pertinent family history.    Social History:     History     Social History   . Marital Status: Widowed     Spouse Name: N/A     Number of Children: N/A   . Years of Education: N/A     Social History Main Topics   . Smoking status: Former Smoker -- 1.00 packs/day for 45 years     Quit date: 08/20/1998   . Smokeless tobacco: Never Used   . Alcohol Use: Yes      Comment: occassionally   . Drug Use: No   . Sexual Activity: Not on file     Other Topics Concern   . Not on file     Social History Narrative       Allergies:   No Known Allergies    Medications:     Prescriptions prior to admission   Medication Sig   . albuterol (PROVENTIL) (2.5 MG/3ML) 0.083% nebulizer solution Take 3 mLs (2.5 mg total) by nebulization every 4 (four) hours as needed  for Wheezing.   Marland Kitchen amLODIPine (NORVASC) 5 MG tablet Take 5 mg by mouth daily.   Marland Kitchen aspirin EC 325 MG EC tablet Take 1 tablet (325 mg total) by mouth daily.   . cloNIDine (CATAPRES) 0.1 MG tablet Take 1 tablet (0.1 mg total) by mouth daily.   Marland Kitchen docusate sodium 100 MG Cap Take 100 mg by mouth 2 (two) times daily as needed for Constipation.   . gabapentin (NEURONTIN) 300 MG capsule Take 1 capsule (300 mg total) by mouth every 12 (twelve) hours.   . hydrochlorothiazide (HYDRODIURIL) 25 MG tablet Take 25 mg by mouth daily.   Marland Kitchen HYDROcodone-acetaminophen (NORCO) 5-325 MG per tablet Take 1 tablet by mouth every 4 (four) hours as needed.   . multivitamin (MULTIVITAMIN) Tab Take 1 tablet by mouth daily.   . nebivolol (BYSTOLIC) 5 MG tablet Take 5 mg by mouth daily.   . ondansetron (ZOFRAN-ODT) 4 MG disintegrating tablet Take 1 tablet (4 mg total) by mouth every 8 (eight) hours as needed for Nausea.   Marland Kitchen tiotropium (SPIRIVA) 18 MCG inhalation capsule Place 18 mcg into inhaler and inhale daily.   . traMADol (ULTRAM) 50 MG tablet Take 1 tablet (50 mg total) by mouth every 6 (six) hours.   Marland Kitchen amLODIPine (NORVASC) 10 MG tablet Take 1 tablet (10 mg total) by mouth daily.   Marland Kitchen lidocaine (LIDODERM) 5 % Place 2 patches onto the skin daily. Remove & Discard patch within 12 hours or as directed by MD   . mupirocin (BACTROBAN) 2 % cream Apply topically daily. Apply to affected area daily.   . mupirocin (BACTROBAN) 2 % ointment Apply topically.   . polycarbophil (FIBERCON) 625 MG tablet Take 2 tablets (1,250 mg total) by mouth daily.   . potassium chloride (K-DUR,KLOR-CON) 10 MEQ tablet Take 1 tablet (10 mEq total) by mouth daily. (Patient taking differently: Take 8 mEq by mouth daily.   )   . Senna (SENOKOT) 8.6 MG Tab Take 2 tablets (17.2 mg total) by mouth nightly as needed.   . [EXPIRED] sodium hypochlorite (DAKINS QUARTER STRENGTH) 0.125 % Solution Apply topically once. Apply to affected area daily.   . traZODone (DESYREL) 100 MG tablet  Take 1  tablet (100 mg total) by mouth nightly as needed for Sleep.       Review of Systems:   A comprehensive review of systems was: History obtained from the patient  General ROS: positive for  - chills and fever  Psychological ROS: negative  Respiratory ROS: no cough, shortness of breath, or wheezing  Cardiovascular ROS: no chest pain or dyspnea on exertion  Gastrointestinal ROS: no abdominal pain, change in bowel habits, or black or bloody stools  Genito-Urinary ROS: no dysuria, trouble voiding, or hematuria  Musculoskeletal ROS: negative  Neurological ROS: no TIA or stroke symptoms  Dermatological ROS: negative    Physical Exam:     Filed Vitals:    03/19/14 0813   BP:    Pulse:    Temp:    Resp:    SpO2: 96%       Intake and Output Summary (Last 24 hours) at Date Time    Intake/Output Summary (Last 24 hours) at 03/19/14 0945  Last data filed at 03/19/14 0631   Gross per 24 hour   Intake    450 ml   Output    600 ml   Net   -150 ml       General appearance - alert, well appearing, and in no distress  Mental status - alert, oriented to person, place, and time  Eyes - pupils equal and reactive, extraocular eye movements intact  Mouth - mucous membranes moist, pharynx normal without lesions  Neck - supple, no significant adenopathy  Chest - clear to auscultation, no wheezes, rales or rhonchi, symmetric air entry  Heart - normal rate, regular rhythm, normal S1, S2, no murmurs, rubs, clicks or gallops  Abdomen - soft, nontender, nondistended, no masses or organomegaly  Neurological - alert, oriented, normal speech, no focal findings or movement disorder noted  Extremities - pedal edema trace +, right knee wound vac in place, no erythema or pus noted    Labs:     Results    Procedure Component Value Units Date/Time    Sedimentation rate (ESR) [[id number]]  (Abnormal) Collected:  03/19/14 0741    Specimen Information:  Blood Updated:  03/19/14 0855     Sed Rate 63 (H) mm/Hr     GFR [[id number]] Collected:  03/19/14 0741      EGFR >60.0 Updated:  03/19/14 0834    Comprehensive metabolic panel [[id number]]  (Abnormal) Collected:  03/19/14 0741    Specimen Information:  Blood Updated:  03/19/14 0834     Glucose 108 (H) mg/dL      BUN 22 (H) mg/dL      Creatinine 0.9 mg/dL      Sodium 086 mEq/L      Potassium 3.0 (L) mEq/L      Chloride 108 mEq/L      CO2 26 mEq/L      CALCIUM 8.3 mg/dL      Protein, Total 6.2 g/dL      Albumin 2.9 (L) g/dL      AST (SGOT) 30 U/L      ALT 16 U/L      Alkaline Phosphatase 62 U/L      Bilirubin, Total 0.8 mg/dL      Globulin 3.3 g/dL      Albumin/Globulin Ratio 0.9     C Reactive Protein [[id number]]  (Abnormal) Collected:  03/19/14 0741    Specimen Information:  Blood Updated:  03/19/14 0834     C-Reactive Protein 25.8 (H) mg/dL  CBC WITH MANUAL DIFFERENTIAL [[id number]]  (Abnormal) Collected:  03/19/14 0741     WBC 12.80 (H) x10 3/uL Updated:  03/19/14 0834     RBC 3.54 (L) x10 6/uL      Hgb 9.4 (L) g/dL      Hematocrit 40.9 (L) %      MCV 84.7 fL      MCH 26.6 (L) pg      MCHC 31.3 (L) g/dL      RDW 14 %      Platelets 162 x10 3/uL      MPV 11.2 fL      Segmented Neutrophils 77 %      Band Neutrophils 17 %      Lymphocytes Manual 4 %      Monocytes Manual 2 %      Eosinophils Manual 0 %      Basophils Manual 0 %      Nucleated RBC 0 /100 WBC      Abs Seg Manual 9.86 (H) x10 3/uL      Bands Absolute 2.18 (H) x10 3/uL      Absolute Lymph Manual 0.51 x10 3/uL      Monocytes Absolute 0.26 x10 3/uL      Absolute Eos Manual 0.00 x10 3/uL      Absolute Baso Manual 0.00 x10 3/uL      Cell Morphology: Abnormal (A)      Hypochromia =1+ (A)     Blood culture #2 [[id number]] Collected:  03/18/14 1742    Specimen Information:  Blood, Intravenous Line Updated:  03/19/14 0800    Narrative:      Gram stain Results called to [id number] by [id number]    .  Readback confirmed, by  [id number] on 03/19/2014 at 07:59  ORDER#: [id number]                                    ORDERED BY: Lovie Macadamia  SOURCE: Blood, Intravenous Line IV                    COLLECTED:  03/18/14 17:42  ANTIBIOTICS AT COLL.:                                RECEIVED :  03/18/14 20:27  Gram stain Results called to [id number] by [id number]    .  Readback confirmed, by [id number] on 03/19/2014 at 07:59  Culture Blood Aerobic and Anaerobic        PRELIM      03/19/14 07:59   +  03/19/14   Anaerobic Blood Culture Positive in less than 24 hrs             Gram Stain Shows: Gram positive cocci in chains             Resembling Streptococcus species             Identification and susceptibility to follow      Urine culture [[id number]] Collected:  03/18/14 2353    Specimen Information:  Urine / Urine, Clean Catch Updated:  03/19/14 0443    UA, Reflex to Microscopic [[id number]]  (Abnormal) Collected:  03/18/14 2353    Specimen Information:  Urine Updated:  03/19/14 0040     Urine Type Clean Catch      Color, UA Yellow  Clarity, UA Clear      Specific Gravity UA 1.020      Urine pH 6.0      Leukocyte Esterase, UA Negative      Nitrite, UA Negative      Protein, UR 30 (A)      Glucose, UA Negative      Ketones UA Negative      Urobilinogen, UA 2.0 mg/dL      Bilirubin, UA Negative      Blood, UA Negative      RBC, UA 6 - 10 (A) /hpf      WBC, UA 0 - 5 /hpf      Squamous Epithelial Cells, Urine 0 - 5 /hpf     Blood culture #1 [[id number]] Collected:  03/18/14 1742    Specimen Information:  Blood, Intravenous Line Updated:  03/18/14 2027    Narrative:      1 BLUE+1 PURPLE    Sedimentation rate (ESR) [[id number]]  (Abnormal) Collected:  03/18/14 1718    Specimen Information:  Blood Updated:  03/18/14 1917     Sed Rate 44 (H) mm/Hr     CBC and differential [[id number]]  (Abnormal) Collected:  03/18/14 1742    Specimen Information:  Blood / Blood Updated:  03/18/14 1811     WBC 19.95 (H) x10 3/uL      RBC 3.87 (L) x10 6/uL      Hgb 10.3 (L) g/dL      Hematocrit 02.7 (L) %      MCV 84.2 fL      MCH 26.6 (L) pg      MCHC 31.6 (L) g/dL      RDW 14 %      Platelets 201 x10 3/uL      MPV 11.1 fL     Manual  Differential [[id number]]  (Abnormal) Collected:  03/18/14 1742     Segmented Neutrophils 79 % Updated:  03/18/14 1811     Band Neutrophils 14 %      Lymphocytes Manual 4 %      Monocytes Manual 3 %      Eosinophils Manual 0 %      Basophils Manual 0 %      Nucleated RBC 0 /100 WBC      Abs Seg Manual 15.76 (H) x10 3/uL      Bands Absolute 2.79 (H) x10 3/uL      Absolute Lymph Manual 0.80 x10 3/uL      Monocytes Absolute 0.60 x10 3/uL      Absolute Eos Manual 0.00 x10 3/uL      Absolute Baso Manual 0.00 x10 3/uL     Cell MorpHology [[id number]] Collected:  03/18/14 1742     Cell Morphology: Normal Updated:  03/18/14 1811    C-reactive Protein (CRP) [[id number]]  (Abnormal) Collected:  03/18/14 1742    Specimen Information:  Blood Updated:  03/18/14 1810     C-Reactive Protein 14.8 (H) mg/dL     Lipase [[id number]] Collected:  03/18/14 1742    Specimen Information:  Blood Updated:  03/18/14 1810     Lipase 8 U/L     GFR [[id number]] Collected:  03/18/14 1742     EGFR >60.0 Updated:  03/18/14 1810    Comprehensive metabolic panel [[id number]]  (Abnormal) Collected:  03/18/14 1742    Specimen Information:  Blood Updated:  03/18/14 1810     Glucose 138 (H) mg/dL      BUN 22 (H) mg/dL  Creatinine 0.9 mg/dL      Sodium 478 mEq/L      Potassium 2.9 (L) mEq/L      Chloride 103 mEq/L      CO2 29 mEq/L      CALCIUM 9.3 mg/dL      Protein, Total 7.1 g/dL      Albumin 3.5 g/dL      AST (SGOT) 30 U/L      ALT 16 U/L      Alkaline Phosphatase 55 U/L      Bilirubin, Total 0.8 mg/dL      Globulin 3.6 g/dL      Albumin/Globulin Ratio 1.0     Rapid influenza A/B antigens [[id number]] Collected:  03/18/14 1742    Specimen Information:  Nasopharyngeal / Nasal Aspirate Updated:  03/18/14 1804    Narrative:      ORDER#: [id number]                                    ORDERED BY: Lovie Macadamia  SOURCE: Nasal Aspirate                               COLLECTED:  03/18/14 17:42  ANTIBIOTICS AT COLL.:                                RECEIVED :   03/18/14 17:46  Influenza Rapid Antigen A&B                FINAL       03/18/14 18:04  03/18/14   Negative for Influenza A and B             Reference Range: Negative      PT/APTT [[id number]]  (Abnormal) Collected:  03/18/14 1742     PT 15.9 (H) sec Updated:  03/18/14 1758     PT INR 1.3 (H)      PT Anticoag. Given Within 48 hrs. None      PTT 29 sec     Lactic acid [[id number]] Collected:  03/18/14 1742    Specimen Information:  Blood Updated:  03/18/14 1748     Lactic acid 1.7 mmol/L           Recent BLOOD CULTURE Invalid input(s): CXBLD  Recent URINE CULTURE Invalid input(s): CXURN    Rads:   Radiological Procedure reviewed.  Radiology Results (24 Hour)    Procedure Component Value Units Date/Time    Chest AP Portable [[id number]] Collected:  03/18/14 1757    Order Status:  Completed  Updated:  03/18/14 1801    Narrative:      History: fever    Technique: Single Portable View        Findings:  The lungs appear clear.  There is no pneumothorax.  There is moderate cardiomegaly.     The mediastinum is within normal  limits.             Impression:       No active disease is seen in the chest.    Laurena Slimmer, MD   03/18/2014 5:57 PM            Signed by: Pearletha Forge, MD

## 2014-03-19 NOTE — Progress Notes (Signed)
Case Management Initial Discharge Planning Assessment     Psychosocial/Demographic Information   Name of interviewee/s:     Orientation and decision making abilities of patient (ie a&ox3 able to make decisions, demented patient, patient on vent, etc)    ptA+O x3   Does the patient have an Advance Directive? Location? (home/on chart, if home-advised to bring in copy?) <no information>  Advance Directive: Patient has advance directive, copy in chart]   Healthcare Decision Maker (HDM) (if other than the patient) Include relationship and contact information.   pt   Any additional emergency contacts? Extended Emergency Contact Information  Primary Emergency Contact: Flowers,Debra  Address: [address] [place] [address] [place]  Home Phone: [phone]  Mobile Phone: [phone]  Relation: Daughter   Pt lives with:  Living Arrangements: Children]   Type of residence where patient lives:    ]   ]family   Prior level of functioning (ambulation & ADL's)   ]independent   Support system-list  (i.e.church, friends, extended family, friends?)  family friends   Correct Insurance listed on face sheet - verified with the patient/HDM  y      Discharge Planning Services in Place  Name of Primary Care Physician verified in patient banner (update in patient banner if not listed) Janalyn Rouse, MD  [phone]   What DME does the patient currently own? (rolling walker, hospital bed, home O2, BiPAP/CPAP, bedside commode, cane, hoyer lift)  ]   ]   ]   Are PT/OT services indicated? If so, has it been ordered?      Has the patient been to an Acute Rehab or SNF in the past?  If so, where?       Does the patient currently have home health or hospice/palliative services in place?  If so, list agency name.       Does the patient already have community dialysis set up?  If so, where?        Readmission Assessment  What is the current LACE score?  9   Is this patient an inpatient to inpatient 30 day  readmission?  n   Previous admission discharge diagnosis  na   If readmission, what was the previous D/C plan?  Contributing factors to readmission (i.e., no follow up appt on previous d/c, unable to get meds, no insurance, no social support, etc.)  na   Did patient/family understand what medication was for and how to administer, symptoms to indicate worsening condition, activity and diet restrictions at time of previous d/c?  na      Anticipated Discharge Plan  Anticipated Disposition: Option A  tbd   Anticipated Disposition: Option B  tbd   Who will transport the patient upon discharge?  family   If applicable, were SNF or Hospice choices provided?  tbd   Palliative Care Consult needed? (if yes, contact attending MD)  n      Inpatient Medicare/Medicare HMO Patients Only  Was an initial IMM signed within 24 hours of admission?  (Look in Media Tab, Documents Table or Shadow Chart)  na         Infected Rt knee swelling fever and leukocytosis with body aches.Pt has  Had tkr and revision of Knee last year and had acute and sub acute rehab following this.Pt seen by Dr Dan Humphreys and sent to ED for admission Pt ambulates using walker

## 2014-03-19 NOTE — Consults (Signed)
CONSULTATION    Date Time: 03/19/2014 1:39 PM  Patient Name: [name], [name]  MRN#: [id number]  DOB: 11-08-1935  Requesting Physician: Marcelino Freestone, MD      Reason for Consultation:   Wound care    Assessment:   [phone] female with right knee wound and new proximal suture line wound.    Plan:   Continue wound VAC to right knee and Dakins 1/4 strength daily to proximal suture line wound.     History:   [name] is a 78 y.o. female who presents to the hospital on 03/18/2014 after seeing Dr. Dan Humphreys in Infectious Disease, and was noted to have fever/chills. Patient was seen two days ago at [facility]. Patient has had a complicated course including s/p right knee infection, replacement of hardware with abx spacer, medical gastroc muscle flap, delayed healing of suture line at right knee. Patient developed a new wound at proximal suture line in the last several weeks; etiology is unclear but could have been from [facility] dressing that was being bridged to the proximal suture line. Wound culture of the proximal suture line wound is +ESBL E. Coli, MDR.     Past Medical History:     Past Medical History   Diagnosis Date   . Hypertension    . Pneumonia    . Chronic obstructive pulmonary disease    . Headache(784.0)      not since surgery in 1957   . Arthritis    . Difficulty in walking(719.7)    . Low back pain        Past Surgical History:     Past Surgical History   Procedure Laterality Date   . Cervical spine surgery     . Lumbar spine surgery  posterior w/ hardware   . Tonsillectomy     . Brain surgery  R side/ 1957 Optic nerve pressure   . Eye surgery       cataracts   . Colonoscopy     . Tubal ligation  1961   . Arthroplasty, knee, total  08/25/2013     Procedure: ARTHROPLASTY, KNEE, TOTAL;  Surgeon: Lane Hacker, MD;  Location: [facility];  Service: Orthopedics;  Laterality: Right;   . Arthroplasty, knee, revision total  09/05/2013     Procedure: ARTHROPLASTY, KNEE, REVISION TOTAL;  Surgeon: Lane Hacker, MD;   Location: [facility];  Service: Orthopedics;  Laterality: Right;  RT KNEE WASHOUT & POLY EXCHANGE **POA/CH** (STRYKER)    . Arthroplasty, knee, resection  09/15/2013     Procedure: ARTHROPLASTY, KNEE, RESECTION;  Surgeon: Lane Hacker, MD;  Location: [facility];  Service: Orthopedics;  Laterality: Right;  AND PLACEMENT OF ANTIBIOTIC SPACER  Placement of wound vac   . Arthroplasty, knee, revision total  09/22/2013     Procedure: ARTHROPLASTY, KNEE, REVISION TOTAL;  Surgeon: Lane Hacker, MD;  Location: [facility];  Service: Orthopedics;  Laterality: Right;   . Debridement, muscle flap closure  09/22/2013     Procedure: DEBRIDEMENT, MUSCLE FLAP CLOSURE;  Surgeon: Ernie Avena, MD;  Location: [facility];  Service: Plastics;  Laterality: Right;  DEBRIDEMENT & MUSCLE FLAP KNEE RT   . Graft, skin  split thickness, (medical)  09/22/2013     Procedure: GRAFT, SKIN  SPLIT THICKNESS, (MEDICAL);  Surgeon: Ernie Avena, MD;  Location: [facility];  Service: Plastics;  Laterality: N/A;       Family History:  History reviewed. No pertinent family history.    Social History:     History     Social History   . Marital Status: Widowed     Spouse Name: N/A     Number of Children: N/A   . Years of Education: N/A     Social History Main Topics   . Smoking status: Former Smoker -- 1.00 packs/day for 45 years     Quit date: 08/20/1998   . Smokeless tobacco: Never Used   . Alcohol Use: Yes      Comment: occassionally   . Drug Use: No   . Sexual Activity: Not on file     Other Topics Concern   . Not on file     Social History Narrative       Allergies:   No Known Allergies    Medications:     Current Facility-Administered Medications   Medication Dose Route Frequency   . amLODIPine  5 mg Oral Daily   . aspirin EC  325 mg Oral Daily   . clindamycin  900 mg Intravenous Q8H SCH   . cloNIDine  0.1 mg Oral Daily   . enoxaparin  40 mg Subcutaneous Daily   . ertapenem  1,000 mg Intravenous Q24H SCH   .  gabapentin  300 mg Oral Q12H SCH   . hydrochlorothiazide  25 mg Oral Daily   . multivitamin  1 tablet Oral Daily   . nebivolol  5 mg Oral Daily   . potassium chloride  40 mEq Oral BID   . sodium hypochlorite   Apply externally Once   . sodium hypochlorite   Apply externally Daily   . tiotropium  18 mcg Inhalation QAM       Review of Systems:   A comprehensive review of systems was: Respiratory ROS: no cough, shortness of breath, or wheezing  Cardiovascular ROS: no chest pain or dyspnea on exertion  Dermatological ROS: right knee vac and dressings in place    Physical Exam:     Filed Vitals:    03/19/14 0813   BP:    Pulse:    Temp:    Resp:    SpO2: 96%       Intake and Output Summary (Last 24 hours) at Date Time    Intake/Output Summary (Last 24 hours) at 03/19/14 1339  Last data filed at 03/19/14 0631   Gross per 24 hour   Intake    450 ml   Output    600 ml   Net   -150 ml       General appearance - alert, well appearing, and in no distress and oriented to person, place, and time  Mental status - alert, oriented to person, place, and time, normal mood, behavior, speech, dress, and thought processes  Musculoskeletal - right medial knee tenderness which patient states is unchanged since surgery several months ago  Skin - wound VAC to right knee: wound is granulating with no gross purulent drainage. Proximal suture line wound has some serous drainage, no gross purulence. Right medial thigh blister stable    Labs Reviewed:     Results    Procedure Component Value Units Date/Time    Sedimentation rate (ESR) [[id number]]  (Abnormal) Collected:  03/19/14 0741    Specimen Information:  Blood Updated:  03/19/14 0855     Sed Rate 63 (H) mm/Hr     GFR [[id number]] Collected:  03/19/14 0741     EGFR >60.0 Updated:  03/19/14 0834    Comprehensive metabolic panel [[id number]]  (Abnormal) Collected:  03/19/14 0741    Specimen Information:  Blood Updated:  03/19/14 0834     Glucose 108 (H) mg/dL      BUN 22 (H) mg/dL       Creatinine 0.9 mg/dL      Sodium 409 mEq/L      Potassium 3.0 (L) mEq/L      Chloride 108 mEq/L      CO2 26 mEq/L      CALCIUM 8.3 mg/dL      Protein, Total 6.2 g/dL      Albumin 2.9 (L) g/dL      AST (SGOT) 30 U/L      ALT 16 U/L      Alkaline Phosphatase 62 U/L      Bilirubin, Total 0.8 mg/dL      Globulin 3.3 g/dL      Albumin/Globulin Ratio 0.9     C Reactive Protein [[id number]]  (Abnormal) Collected:  03/19/14 0741    Specimen Information:  Blood Updated:  03/19/14 0834     C-Reactive Protein 25.8 (H) mg/dL     CBC WITH MANUAL DIFFERENTIAL [[id number]]  (Abnormal) Collected:  03/19/14 0741     WBC 12.80 (H) x10 3/uL Updated:  03/19/14 0834     RBC 3.54 (L) x10 6/uL      Hgb 9.4 (L) g/dL      Hematocrit 57.8 (L) %      MCV 84.7 fL      MCH 26.6 (L) pg      MCHC 31.3 (L) g/dL      RDW 14 %      Platelets 162 x10 3/uL      MPV 11.2 fL      Segmented Neutrophils 77 %      Band Neutrophils 17 %      Lymphocytes Manual 4 %      Monocytes Manual 2 %      Eosinophils Manual 0 %      Basophils Manual 0 %      Nucleated RBC 0 /100 WBC      Abs Seg Manual 9.86 (H) x10 3/uL      Bands Absolute 2.18 (H) x10 3/uL      Absolute Lymph Manual 0.51 x10 3/uL      Monocytes Absolute 0.26 x10 3/uL      Absolute Eos Manual 0.00 x10 3/uL      Absolute Baso Manual 0.00 x10 3/uL      Cell Morphology: Abnormal (A)      Hypochromia =1+ (A)     Blood culture #2 [[id number]] Collected:  03/18/14 1742    Specimen Information:  Blood, Intravenous Line Updated:  03/19/14 0800    Narrative:      Gram stain Results called to [id number] by [id number]    .  Readback confirmed, by  [id number] on 03/19/2014 at 07:59  ORDER#: [id number]                                    ORDERED BY: Lovie Macadamia  SOURCE: Blood, Intravenous Line IV                   COLLECTED:  03/18/14 17:42  ANTIBIOTICS AT COLL.:  RECEIVED :  03/18/14 20:27  Gram stain Results called to [id number] by [id number]    .  Readback confirmed, by [id number] on 03/19/2014 at  07:59  Culture Blood Aerobic and Anaerobic        PRELIM      03/19/14 07:59   +  03/19/14   Anaerobic Blood Culture Positive in less than 24 hrs             Gram Stain Shows: Gram positive cocci in chains             Resembling Streptococcus species             Identification and susceptibility to follow      Urine culture [[id number]] Collected:  03/18/14 2353    Specimen Information:  Urine / Urine, Clean Catch Updated:  03/19/14 0443    UA, Reflex to Microscopic [[id number]]  (Abnormal) Collected:  03/18/14 2353    Specimen Information:  Urine Updated:  03/19/14 0040     Urine Type Clean Catch      Color, UA Yellow      Clarity, UA Clear      Specific Gravity UA 1.020      Urine pH 6.0      Leukocyte Esterase, UA Negative      Nitrite, UA Negative      Protein, UR 30 (A)      Glucose, UA Negative      Ketones UA Negative      Urobilinogen, UA 2.0 mg/dL      Bilirubin, UA Negative      Blood, UA Negative      RBC, UA 6 - 10 (A) /hpf      WBC, UA 0 - 5 /hpf      Squamous Epithelial Cells, Urine 0 - 5 /hpf     Blood culture #1 [[id number]] Collected:  03/18/14 1742    Specimen Information:  Blood, Intravenous Line Updated:  03/18/14 2027    Narrative:      1 BLUE+1 PURPLE    Sedimentation rate (ESR) [[id number]]  (Abnormal) Collected:  03/18/14 1718    Specimen Information:  Blood Updated:  03/18/14 1917     Sed Rate 44 (H) mm/Hr     CBC and differential [[id number]]  (Abnormal) Collected:  03/18/14 1742    Specimen Information:  Blood / Blood Updated:  03/18/14 1811     WBC 19.95 (H) x10 3/uL      RBC 3.87 (L) x10 6/uL      Hgb 10.3 (L) g/dL      Hematocrit 25.4 (L) %      MCV 84.2 fL      MCH 26.6 (L) pg      MCHC 31.6 (L) g/dL      RDW 14 %      Platelets 201 x10 3/uL      MPV 11.1 fL     Manual Differential [[id number]]  (Abnormal) Collected:  03/18/14 1742     Segmented Neutrophils 79 % Updated:  03/18/14 1811     Band Neutrophils 14 %      Lymphocytes Manual 4 %      Monocytes Manual 3 %      Eosinophils Manual 0 %       Basophils Manual 0 %      Nucleated RBC 0 /100 WBC      Abs Seg Manual 15.76 (H) x10 3/uL      Bands Absolute 2.79 (H) x10  3/uL      Absolute Lymph Manual 0.80 x10 3/uL      Monocytes Absolute 0.60 x10 3/uL      Absolute Eos Manual 0.00 x10 3/uL      Absolute Baso Manual 0.00 x10 3/uL     Cell MorpHology [[id number]] Collected:  03/18/14 1742     Cell Morphology: Normal Updated:  03/18/14 1811    C-reactive Protein (CRP) [[id number]]  (Abnormal) Collected:  03/18/14 1742    Specimen Information:  Blood Updated:  03/18/14 1810     C-Reactive Protein 14.8 (H) mg/dL     Lipase [[id number]] Collected:  03/18/14 1742    Specimen Information:  Blood Updated:  03/18/14 1810     Lipase 8 U/L     GFR [[id number]] Collected:  03/18/14 1742     EGFR >60.0 Updated:  03/18/14 1810    Comprehensive metabolic panel [[id number]]  (Abnormal) Collected:  03/18/14 1742    Specimen Information:  Blood Updated:  03/18/14 1810     Glucose 138 (H) mg/dL      BUN 22 (H) mg/dL      Creatinine 0.9 mg/dL      Sodium 253 mEq/L      Potassium 2.9 (L) mEq/L      Chloride 103 mEq/L      CO2 29 mEq/L      CALCIUM 9.3 mg/dL      Protein, Total 7.1 g/dL      Albumin 3.5 g/dL      AST (SGOT) 30 U/L      ALT 16 U/L      Alkaline Phosphatase 55 U/L      Bilirubin, Total 0.8 mg/dL      Globulin 3.6 g/dL      Albumin/Globulin Ratio 1.0     Rapid influenza A/B antigens [[id number]] Collected:  03/18/14 1742    Specimen Information:  Nasopharyngeal / Nasal Aspirate Updated:  03/18/14 1804    Narrative:      ORDER#: [id number]                                    ORDERED BY: Lovie Macadamia  SOURCE: Nasal Aspirate                               COLLECTED:  03/18/14 17:42  ANTIBIOTICS AT COLL.:                                RECEIVED :  03/18/14 17:46  Influenza Rapid Antigen A&B                FINAL       03/18/14 18:04  03/18/14   Negative for Influenza A and B             Reference Range: Negative      PT/APTT [[id number]]  (Abnormal) Collected:  03/18/14 1742      PT 15.9 (H) sec Updated:  03/18/14 1758     PT INR 1.3 (H)      PT Anticoag. Given Within 48 hrs. None      PTT 29 sec     Lactic acid [[id number]] Collected:  03/18/14 1742    Specimen Information:  Blood Updated:  03/18/14 1748     Lactic acid 1.7 mmol/L  Signed by: Ernie Avena, M.D.

## 2014-03-20 ENCOUNTER — Inpatient Hospital Stay: Payer: No Typology Code available for payment source

## 2014-03-20 LAB — GLUCOSE WHOLE BLOOD - POCT: Whole Blood Glucose POCT: 128 mg/dL — ABNORMAL HIGH (ref 70–100)

## 2014-03-20 LAB — BASIC METABOLIC PANEL
BUN: 23 mg/dL — ABNORMAL HIGH (ref 7–19)
CO2: 22 mEq/L (ref 22–29)
Calcium: 8.4 mg/dL (ref 7.9–10.2)
Chloride: 106 mEq/L (ref 100–111)
Creatinine: 0.9 mg/dL (ref 0.6–1.0)
Glucose: 102 mg/dL — ABNORMAL HIGH (ref 70–100)
Potassium: 3.9 mEq/L (ref 3.5–5.1)
Sodium: 141 mEq/L (ref 136–145)

## 2014-03-20 LAB — GFR: EGFR: >60

## 2014-03-20 MED ORDER — POTASSIUM CHLORIDE CRYS ER 20 MEQ PO TBCR
20.0000 meq | EXTENDED_RELEASE_TABLET | Freq: Every day | ORAL | Status: DC
Start: 2014-03-20 — End: 2014-03-22
  Administered 2014-03-20 – 2014-03-22 (×3): 20 meq via ORAL
  Filled 2014-03-20 (×2): qty 1

## 2014-03-20 NOTE — Progress Notes (Signed)
PROGRESS NOTE    Date Time: 03/20/2014 12:15 PM  Patient Name: [name], [name]      Assessment/ Plan:   1) ESBL positive Chronic Right Leg wound/ Cellulitis - f/u cxs, ID on board. Continue broad spectrum abx. Ortho aspirated knee today but no fluid. Continue wound care and plastic surgery f/u. Consider CT of LE.   2) Hypokalemia - replaced, continue daily.   3) OA/TKA complicated with infection  4) Anemia - related to chronic infection  5) Hypertension- controled.  6) COPD, stable  7) Constipation    Disposition - continue level of care.     Subjective:   Complains of pain on whole right leg.     Medications:     Current Facility-Administered Medications   Medication Dose Route Frequency   . amLODIPine  5 mg Oral Daily   . aspirin EC  325 mg Oral Daily   . clindamycin  900 mg Intravenous Q8H SCH   . cloNIDine  0.1 mg Oral Daily   . enoxaparin  40 mg Subcutaneous Daily   . ertapenem  1,000 mg Intravenous Q24H SCH   . gabapentin  300 mg Oral Q12H SCH   . hydrochlorothiazide  25 mg Oral Daily   . multivitamin  1 tablet Oral Daily   . nebivolol  5 mg Oral Daily   . potassium chloride  20 mEq Oral Daily   . sodium hypochlorite   Apply externally Once   . sodium hypochlorite   Apply externally Daily   . tiotropium  18 mcg Inhalation QAM       Review of Systems:   A comprehensive review of systems was: History obtained from the patient  General ROS: positive for  - fever  Psychological ROS: negative  Respiratory ROS: no cough, shortness of breath, or wheezing  Cardiovascular ROS: no chest pain or dyspnea on exertion  Gastrointestinal ROS: no abdominal pain, change in bowel habits, or black or bloody stools  Genito-Urinary ROS: no dysuria, trouble voiding, or hematuria  Musculoskeletal ROS: positive for - pain in leg - right  Neurological ROS: no TIA or stroke symptoms  Dermatological ROS: negative    Physical Exam:     Filed Vitals:    03/20/14 0915   BP:    Pulse:    Temp:    Resp:    SpO2: 94%       Intake and Output  Summary (Last 24 hours) at Date Time    Intake/Output Summary (Last 24 hours) at 03/20/14 1215  Last data filed at 03/20/14 0915   Gross per 24 hour   Intake     50 ml   Output    334 ml   Net   -284 ml     General appearance - alert, well appearing, and in no distress  Mental status - alert, oriented to person, place, and time  Eyes - pupils equal and reactive, extraocular eye movements intact  Mouth - mucous membranes moist, pharynx normal without lesions  Neck - supple, no significant adenopathy  Chest - clear to auscultation, no wheezes, rales or rhonchi, symmetric air entry  Heart - normal rate, regular rhythm, normal S1, S2, no murmurs, rubs, clicks or gallops  Abdomen - soft, nontender, nondistended, no masses or organomegaly  Neurological - alert, oriented, normal speech, no focal findings or movement disorder noted  Extremities - pedal edema trace +, right knee wound vac in place, right leg appears warm and is sensitive to touch  Labs:     Results    Procedure Component Value Units Date/Time    CULTURE BLOOD AEROBIC AND ANAEROBIC [[id number]] Collected:  03/20/14 0459    Specimen Information:  Blood, Venipuncture Updated:  03/20/14 0952    Narrative:      1 BLUE+1 PURPLE    CULTURE BLOOD AEROBIC AND ANAEROBIC [[id number]] Collected:  03/20/14 0459    Specimen Information:  Blood, Venipuncture Updated:  03/20/14 0952    Narrative:      1 BLUE+1 PURPLE    Basic Metabolic Panel [[id number]]  (Abnormal) Collected:  03/20/14 0459    Specimen Information:  Blood Updated:  03/20/14 0533     Glucose 102 (H) mg/dL      BUN 23 (H) mg/dL      Creatinine 0.9 mg/dL      CALCIUM 8.4 mg/dL      Sodium 086 mEq/L      Potassium 3.9 mEq/L      Chloride 106 mEq/L      CO2 22 mEq/L     GFR [[id number]] Collected:  03/20/14 0459     EGFR >60.0 Updated:  03/20/14 0533    Urine culture [[id number]] Collected:  03/18/14 2353    Specimen Information:  Urine / Urine, Clean Catch Updated:  03/20/14 0456    Narrative:      ORDER#:  [id number]                                    ORDERED BY: Lovie Macadamia  SOURCE: Urine, Clean Catch                           COLLECTED:  03/18/14 23:53  ANTIBIOTICS AT COLL.:                                RECEIVED :  03/19/14 04:43  Culture Urine                              FINAL       03/20/14 04:56  03/20/14   30,000 - 50,000 CFU/ML of normal urogenital or skin microbiota, no             further work      Blood culture #2 [[id number]] Collected:  03/18/14 1742    Specimen Information:  Blood, Intravenous Line Updated:  03/20/14 0045    Narrative:      Gram stain Results called to [id number] by [id number]    .  Readback confirmed, by  [id number] on 03/19/2014 at 07:59  ORDER#: [id number]                                    ORDERED BY: Lovie Macadamia  SOURCE: Blood, Intravenous Line IV                   COLLECTED:  03/18/14 17:42  ANTIBIOTICS AT COLL.:                                RECEIVED :  03/18/14 20:27  Gram stain Results called to [id number] by [id number]    .  Readback  confirmed, by [id number] on 03/19/2014 at 07:59  Culture Blood Aerobic and Anaerobic        PRELIM      03/20/14 00:45   +  03/19/14   Anaerobic Blood Culture Positive in less than 24 hrs             Gram Stain Shows: Gram positive cocci in chains             Resembling Streptococcus species             Identification and susceptibility to follow  03/20/14   Culture Positive for Streptococcus species               Identification and susceptibility to follow        Blood culture #1 [[id number]] Collected:  03/18/14 1742    Specimen Information:  Blood, Intravenous Line Updated:  03/19/14 2121    Narrative:      ORDER#: [id number]                                    ORDERED BY: Lovie Macadamia  SOURCE: Blood, Intravenous Line IV                   COLLECTED:  03/18/14 17:42  ANTIBIOTICS AT COLL.:                                RECEIVED :  03/18/14 20:27  Culture Blood Aerobic and Anaerobic        PRELIM      03/19/14 21:21  03/19/14   No Growth after 1 day/s of  incubation.                Rads:   Radiological Procedure reviewed  Radiology Results (24 Hour)    ** No results found for the last 24 hours. **        Signed by: Pearletha Forge, MD

## 2014-03-20 NOTE — Plan of Care (Signed)
Problem: Pain  Goal: Patient's pain/discomfort is manageable  Outcome: Progressing  Patient denied pain throughout the night. Transferred to [facility] with little difficulty

## 2014-03-20 NOTE — Progress Notes (Signed)
Pt a/ox3. Meds/meals given and tolerated well. Dressing on right leg changed. Hemovac intact at 125. Pt oob to commode with standby assist. Pain level tolerable. Continue current plan of care.

## 2014-03-20 NOTE — Progress Notes (Addendum)
PROGRESS NOTE    Date Time: 03/20/2014 6:49 AM  Patient Name: [name],[name]      Assessment:    s/p R resection with chronic wound healing issues   Plan:   Attempted aspiration of right knee today, no fluid was obtained  ID Dr. Dan Humphreys is following.  Wound healing with wound VAC in place.  Plastic Surgery (Dr. Campbell Lerner) also following.  At this point it does not appear there is an active infectious process with abscess in the knee.  Would recommend continuing IV abx and potentially looking for another source of infection.  If no other source is found consider CT of the distal thigh/knee to evaluate further.       Subjective:   Pain controlled, tolerating diet    Medications:     Current Facility-Administered Medications   Medication Dose Route Frequency   . amLODIPine  5 mg Oral Daily   . aspirin EC  325 mg Oral Daily   . clindamycin  900 mg Intravenous Q8H SCH   . cloNIDine  0.1 mg Oral Daily   . enoxaparin  40 mg Subcutaneous Daily   . ertapenem  1,000 mg Intravenous Q24H SCH   . gabapentin  300 mg Oral Q12H SCH   . hydrochlorothiazide  25 mg Oral Daily   . multivitamin  1 tablet Oral Daily   . nebivolol  5 mg Oral Daily   . potassium chloride  40 mEq Oral BID   . sodium hypochlorite   Apply externally Once   . sodium hypochlorite   Apply externally Daily   . tiotropium  18 mcg Inhalation QAM       Physical Exam:     Filed Vitals:    03/19/14 2300   BP: 103/51   Pulse: 67   Temp: 98.8 F (37.1 C)   Resp: 18   SpO2:        Intake and Output Summary (Last 24 hours) at Date Time    Intake/Output Summary (Last 24 hours) at 03/20/14 0649  Last data filed at 03/19/14 1539   Gross per 24 hour   Intake     50 ml   Output      0 ml   Net     50 ml       General appearance - no acute distress  Musculoskeletal -  Wound vac on distal wound and packing in proximal knee wound  Fires quad/TA/EHL/GSC  WWP distally  Calves soft, nontender bilateral    Labs:     Results    Procedure Component Value Units Date/Time    Basic Metabolic  Panel [[id number]]  (Abnormal) Collected:  03/20/14 0459    Specimen Information:  Blood Updated:  03/20/14 0533     Glucose 102 (H) mg/dL      BUN 23 (H) mg/dL      Creatinine 0.9 mg/dL      CALCIUM 8.4 mg/dL      Sodium 301 mEq/[name]      Potassium 3.9 mEq/[name]      Chloride 106 mEq/[name]      CO2 22 mEq/[name]     GFR [[id number]] Collected:  03/20/14 0459     EGFR >60.0 Updated:  03/20/14 0533    CULTURE BLOOD AEROBIC AND ANAEROBIC [[id number]] Collected:  03/20/14 0459    Specimen Information:  Blood, Venipuncture Updated:  03/20/14 0459    Narrative:      1 BLUE+1 PURPLE    CULTURE BLOOD AEROBIC AND ANAEROBIC [  [id number]] Collected:  03/20/14 0459    Specimen Information:  Blood, Venipuncture Updated:  03/20/14 0459    Narrative:      1 BLUE+1 PURPLE    Urine culture [[id number]] Collected:  03/18/14 2353    Specimen Information:  Urine / Urine, Clean Catch Updated:  03/20/14 0456    Narrative:      ORDER#: [id number]                                    ORDERED BY: Lovie Macadamia  SOURCE: Urine, Clean Catch                           COLLECTED:  03/18/14 23:53  ANTIBIOTICS AT COLL.:                                RECEIVED :  03/19/14 04:43  Culture Urine                              FINAL       03/20/14 04:56  03/20/14   30,000 - 50,000 CFU/ML of normal urogenital or skin microbiota, no             further work      Blood culture #2 [[id number]] Collected:  03/18/14 1742    Specimen Information:  Blood, Intravenous Line Updated:  03/20/14 0045    Narrative:      Gram stain Results called to [id number] by [id number]    .  Readback confirmed, by  [id number] on 03/19/2014 at 07:59  ORDER#: [id number]                                    ORDERED BY: Lovie Macadamia  SOURCE: Blood, Intravenous Line IV                   COLLECTED:  03/18/14 17:42  ANTIBIOTICS AT COLL.:                                RECEIVED :  03/18/14 20:27  Gram stain Results called to [id number] by [id number]    .  Readback confirmed, by [id number] on 03/19/2014 at 07:59  Culture Blood Aerobic and  Anaerobic        PRELIM      03/20/14 00:45   +  03/19/14   Anaerobic Blood Culture Positive in less than 24 hrs             Gram Stain Shows: Gram positive cocci in chains             Resembling Streptococcus species             Identification and susceptibility to follow  03/20/14   Culture Positive for Streptococcus species               Identification and susceptibility to follow        Blood culture #1 [[id number]] Collected:  03/18/14 1742    Specimen Information:  Blood, Intravenous Line Updated:  03/19/14 2121    Narrative:      ORDER#: [id number]  ORDERED BY: HOMEYER, MICHAE  SOURCE: Blood, Intravenous Line IV                   COLLECTED:  03/18/14 17:42  ANTIBIOTICS AT COLL.:                                RECEIVED :  03/18/14 20:27  Culture Blood Aerobic and Anaerobic        PRELIM      03/19/14 21:21  03/19/14   No Growth after 1 day/s of incubation.      Sedimentation rate (ESR) [[id number]]  (Abnormal) Collected:  03/19/14 0741    Specimen Information:  Blood Updated:  03/19/14 0855     Sed Rate 63 (H) mm/Hr     GFR [[id number]] Collected:  03/19/14 0741     EGFR >60.0 Updated:  03/19/14 0834    Comprehensive metabolic panel [[id number]]  (Abnormal) Collected:  03/19/14 0741    Specimen Information:  Blood Updated:  03/19/14 0834     Glucose 108 (H) mg/dL      BUN 22 (H) mg/dL      Creatinine 0.9 mg/dL      Sodium 086 mEq/[name]      Potassium 3.0 ([name]) mEq/[name]      Chloride 108 mEq/[name]      CO2 26 mEq/[name]      CALCIUM 8.3 mg/dL      Protein, Total 6.2 g/dL      Albumin 2.9 ([name]) g/dL      AST (SGOT) 30 U/[name]      ALT 16 U/[name]      Alkaline Phosphatase 62 U/[name]      Bilirubin, Total 0.8 mg/dL      Globulin 3.3 g/dL      Albumin/Globulin Ratio 0.9     C Reactive Protein [[id number]]  (Abnormal) Collected:  03/19/14 0741    Specimen Information:  Blood Updated:  03/19/14 0834     C-Reactive Protein 25.8 (H) mg/dL     CBC WITH MANUAL DIFFERENTIAL [[id number]]  (Abnormal) Collected:  03/19/14 0741      WBC 12.80 (H) x10 3/uL Updated:  03/19/14 0834     RBC 3.54 ([name]) x10 6/uL      Hgb 9.4 ([name]) g/dL      Hematocrit 01.0 ([name]) %      MCV 84.7 fL      MCH 26.6 ([name]) pg      MCHC 31.3 ([name]) g/dL      RDW 14 %      Platelets 162 x10 3/uL      MPV 11.2 fL      Segmented Neutrophils 77 %      Band Neutrophils 17 %      Lymphocytes Manual 4 %      Monocytes Manual 2 %      Eosinophils Manual 0 %      Basophils Manual 0 %      Nucleated RBC 0 /100 WBC      Abs Seg Manual 9.86 (H) x10 3/uL      Bands Absolute 2.18 (H) x10 3/uL      Absolute Lymph Manual 0.51 x10 3/uL      Monocytes Absolute 0.26 x10 3/uL      Absolute Eos Manual 0.00 x10 3/uL      Absolute Baso Manual 0.00 x10 3/uL      Cell Morphology: Abnormal (A)  Hypochromia =1+ (A)           Physical Therapy:   Pain Score: 0-No pain (03/19/14 2300)              Rads:   Radiological Procedure reviewed.    Signed by: Cheryle Horsfall

## 2014-03-20 NOTE — Progress Notes (Signed)
INFECTIOUS DISEASES PROGRESS NOTE    Date Time: 03/20/2014 3:59 PM  Patient Name: [name],[name]      Assessment and Recommendations:     Patient Active Problem List   Diagnosis   . Total knee replacement status   . Low back pain   . Chronic obstructive pulmonary disease   . Hypertension   . Debility following multiple R knee revisions   . Non-healing surgical wound- right knee    . 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  CX at that time + for  Proteus - tx'd with ciprofloxacin.    Recently pt developed an open wound at apex of  Healed right TKR vertical surgical scar.  (developed   Under a chronic eschar)  And has a blister there as well    Recent cx grew group c strep and esbl e coli.    Pt admitted with fever, sepsis, leukocytosis -    Admission blood cxs - 1/2 from 6/10 has grown  Strep to be identified.      Would continue Invanz and vancomcyin  Add clindamycin for tx strep    Repeat bld cxs to document clearance  Echo         Subjective:   Still had fever last pm    Antibiotics:   Invanz and Vancomycin IV 6/10  Clindamycin 6/11    Medications:     Current Facility-Administered Medications   Medication Dose Route Frequency   . amLODIPine  5 mg Oral Daily   . aspirin EC  325 mg Oral Daily   . clindamycin  900 mg Intravenous Q8H SCH   . cloNIDine  0.1 mg Oral Daily   . enoxaparin  40 mg Subcutaneous Daily   . ertapenem  1,000 mg Intravenous Q24H SCH   . gabapentin  300 mg Oral Q12H SCH   . hydrochlorothiazide  25 mg Oral Daily   . multivitamin  1 tablet Oral Daily   . nebivolol  5 mg Oral Daily   . potassium chloride  20 mEq Oral Daily   . sodium hypochlorite   Apply externally Once   . sodium hypochlorite   Apply externally Daily   . tiotropium  18 mcg Inhalation QAM       Central Access/Endovascular Hardware:     Active PICC Line / CVC Line / PIV Line / Drain / Airway / Intraosseous Line / Epidural Line / ART Line /  Line / Wound / Pressure Ulcer / NG/OG Tube    Name:   Placement date:   Placement time:   Site:   Days:    OnQ Pump (Incisional) 08/25/13 knee Right  08/25/13   0939   knee   206    Peripheral IV 09/24/13 Left Hand  09/24/13   0730   Hand   176    Peripheral IV 03/18/14 Right Antecubital  03/18/14   1740   Antecubital   1    Peripheral IV 03/18/14 Right Hand  03/18/14   1920   Hand   1    Closed/Suction Drain Right Knee 10 Fr.  09/05/13   1401   Knee   195    Negative Pressure Wound Therapy Knee Right        Knee       Epidural Catheter  Incision Site 09/05/13 Knee Right  09/05/13   1437     195    Incision Site 09/15/13 Knee Right  09/15/13   1517     185    Incision Site 09/22/13 Leg Right  09/22/13   1901     178          Review of Systems:   Patient is without fevers, chills, nausea, vomiting, diarrhea, abdominal pain, cough, dyspnea, headache, rash.      Physical Exam:     Filed Vitals:    03/19/14 2038 03/19/14 2300 03/20/14 0733 03/20/14 0915   BP: 108/51 103/51 129/52    Pulse: 74 67 63    Temp: 101.3 F (38.5 C) 98.8 F (37.1 C) 97 F (36.1 C)    TempSrc:       Resp: 18 18 18     Height:       Weight:       SpO2: 97%  94% 94%       Temp (24hrs), Avg:99.7 F (37.6 C), Min:97 F (36.1 C), Max:101.8 F (38.8 C)        In no apparent distress  Sclera anicteric, conjunctivae clear  Oral pharynx s erythema, exudate, or other significant lesions  No Thrush  Neck supple, s lymphadenopathy  Lungs Clear  Heart Regular rate and rhythm s murmurs, rubs, or gallops  Abdomen Soft, non tender, non distended, normal bowel sounds  Back No Costovertebral angle tenderness, no point tenderness along the spine  Extremities No Cyanosis, no clubbing,   +2 edema   right distal thigh incisional wound c clean dry intact  Dressing and right leg flap wound with intact wound vac  Neurologic exam is grossly non-focal  Skin without rash or other significant lesions      Labs:     Results    Procedure Component Value Units  Date/Time    CULTURE BLOOD AEROBIC AND ANAEROBIC [[id number]] Collected:  03/20/14 0459    Specimen Information:  Blood, Venipuncture Updated:  03/20/14 0952    Narrative:      1 BLUE+1 PURPLE    CULTURE BLOOD AEROBIC AND ANAEROBIC [[id number]] Collected:  03/20/14 0459    Specimen Information:  Blood, Venipuncture Updated:  03/20/14 0952    Narrative:      1 BLUE+1 PURPLE    Basic Metabolic Panel [[id number]]  (Abnormal) Collected:  03/20/14 0459    Specimen Information:  Blood Updated:  03/20/14 0533     Glucose 102 (H) mg/dL      BUN 23 (H) mg/dL      Creatinine 0.9 mg/dL      CALCIUM 8.4 mg/dL      Sodium 962 mEq/[name]      Potassium 3.9 mEq/[name]      Chloride 106 mEq/[name]      CO2 22 mEq/[name]     GFR [[id number]] Collected:  03/20/14 0459     EGFR >60.0 Updated:  03/20/14 0533    Urine culture [[id number]] Collected:  03/18/14 2353    Specimen Information:  Urine / Urine, Clean Catch Updated:  03/20/14 0456    Narrative:      ORDER#: [id number]                                    ORDERED BY: Lovie Macadamia  SOURCE: Urine, Clean Catch  COLLECTED:  03/18/14 23:53  ANTIBIOTICS AT COLL.:                                RECEIVED :  03/19/14 04:43  Culture Urine                              FINAL       03/20/14 04:56  03/20/14   30,000 - 50,000 CFU/ML of normal urogenital or skin microbiota, no             further work      Blood culture #2 [[id number]] Collected:  03/18/14 1742    Specimen Information:  Blood, Intravenous Line Updated:  03/20/14 0045    Narrative:      Gram stain Results called to [id number] by [id number]    .  Readback confirmed, by  [id number] on 03/19/2014 at 07:59  ORDER#: [id number]                                    ORDERED BY: Lovie Macadamia  SOURCE: Blood, Intravenous Line IV                   COLLECTED:  03/18/14 17:42  ANTIBIOTICS AT COLL.:                                RECEIVED :  03/18/14 20:27  Gram stain Results called to [id number] by [id number]    .  Readback confirmed, by [id number] on 03/19/2014 at  07:59  Culture Blood Aerobic and Anaerobic        PRELIM      03/20/14 00:45   +  03/19/14   Anaerobic Blood Culture Positive in less than 24 hrs             Gram Stain Shows: Gram positive cocci in chains             Resembling Streptococcus species             Identification and susceptibility to follow  03/20/14   Culture Positive for Streptococcus species               Identification and susceptibility to follow        Blood culture #1 [[id number]] Collected:  03/18/14 1742    Specimen Information:  Blood, Intravenous Line Updated:  03/19/14 2121    Narrative:      ORDER#: [id number]                                    ORDERED BY: Lovie Macadamia  SOURCE: Blood, Intravenous Line IV                   COLLECTED:  03/18/14 17:42  ANTIBIOTICS AT COLL.:                                RECEIVED :  03/18/14 20:27  Culture Blood Aerobic and Anaerobic        PRELIM      03/19/14 21:21  03/19/14   No Growth after 1 day/s of incubation.  Recent Labs  Lab 03/19/14  0741   WBC 12.80*   Hgb 9.4*   Hematocrit 30.0*   Platelets 162        Recent Labs  Lab 03/20/14  0459 03/19/14  0741   Sodium 141 143   Potassium 3.9 3.0*   Chloride 106 108   CO2 22 26   BUN 23* 22*   Creatinine 0.9 0.9   CALCIUM 8.4 8.3   Albumin  --  2.9*   Protein, Total  --  6.2   Bilirubin, Total  --  0.8   Alkaline Phosphatase  --  62   ALT  --  16   AST (SGOT)  --  30   Glucose 102* 108*       Recent Labs  Lab 03/18/14  1742   PT INR 1.3*          Rads:     Radiology Results (24 Hour)    ** No results found for the last 24 hours. **          Signed by: Ray Church, MD

## 2014-03-20 NOTE — Plan of Care (Signed)
Problem: Pain  Goal: Patient's pain/discomfort is manageable  Outcome: Progressing  Instruct patient to ask for pain meds when pain level increases and reposition patient as needed.

## 2014-03-21 DIAGNOSIS — I10 Essential (primary) hypertension: Secondary | ICD-10-CM | POA: Diagnosis present

## 2014-03-21 DIAGNOSIS — Z96659 Presence of unspecified artificial knee joint: Secondary | ICD-10-CM

## 2014-03-21 DIAGNOSIS — J449 Chronic obstructive pulmonary disease, unspecified: Secondary | ICD-10-CM | POA: Diagnosis present

## 2014-03-21 DIAGNOSIS — D72829 Elevated white blood cell count, unspecified: Secondary | ICD-10-CM | POA: Diagnosis present

## 2014-03-21 DIAGNOSIS — E876 Hypokalemia: Secondary | ICD-10-CM | POA: Diagnosis present

## 2014-03-21 DIAGNOSIS — D649 Anemia, unspecified: Secondary | ICD-10-CM | POA: Diagnosis present

## 2014-03-21 LAB — CBC WITH MANUAL DIFFERENTIAL
Band Neutrophils Absolute: 1.5 10*3/uL — ABNORMAL HIGH (ref 0.00–1.00)
Band Neutrophils: 14 %
Basophils Absolute Manual: 0 10*3/uL (ref 0.00–0.20)
Basophils Manual: 0 %
Cell Morphology: ABNORMAL — AB
Eosinophils Absolute Manual: 0.11 10*3/uL (ref 0.00–0.70)
Eosinophils Manual: 1 %
Hematocrit: 27.5 % — ABNORMAL LOW (ref 37.0–47.0)
Hgb: 8.7 g/dL — ABNORMAL LOW (ref 12.0–16.0)
Lymphocytes Absolute Manual: 0.53 10*3/uL (ref 0.50–4.40)
Lymphocytes Manual: 5 %
MCH: 26.5 pg — ABNORMAL LOW (ref 28.0–32.0)
MCHC: 31.6 g/dL — ABNORMAL LOW (ref 32.0–36.0)
MCV: 83.8 fL (ref 80.0–100.0)
MPV: 11.4 fL (ref 9.4–12.3)
Monocytes Absolute: 0.53 10*3/uL (ref 0.00–1.20)
Monocytes Manual: 5 %
Neutrophils Absolute Manual: 8.02 10*3/uL (ref 1.80–8.10)
Nucleated RBC: 0 /100 WBC (ref 0–1)
Platelets: 153 10*3/uL (ref 140–400)
RBC: 3.28 10*6/uL — ABNORMAL LOW (ref 4.20–5.40)
RDW: 15 % (ref 12–15)
Segmented Neutrophils: 75 %
WBC: 10.69 10*3/uL (ref 3.50–10.80)

## 2014-03-21 LAB — COMPREHENSIVE METABOLIC PANEL
ALT: 15 U/L (ref 0–55)
AST (SGOT): 20 U/L (ref 5–34)
Albumin/Globulin Ratio: 0.7 — ABNORMAL LOW (ref 0.9–2.2)
Albumin: 2.6 g/dL — ABNORMAL LOW (ref 3.5–5.0)
Alkaline Phosphatase: 71 U/L (ref 37–106)
BUN: 21 mg/dL — ABNORMAL HIGH (ref 7–19)
Bilirubin, Total: 0.7 mg/dL (ref 0.2–1.2)
CO2: 27 mEq/L (ref 22–29)
Calcium: 8.4 mg/dL (ref 7.9–10.2)
Chloride: 106 mEq/L (ref 100–111)
Creatinine: 0.8 mg/dL (ref 0.6–1.0)
Globulin: 3.5 g/dL (ref 2.0–3.6)
Glucose: 114 mg/dL — ABNORMAL HIGH (ref 70–100)
Potassium: 3.2 mEq/L — ABNORMAL LOW (ref 3.5–5.1)
Protein, Total: 6.1 g/dL (ref 6.0–8.3)
Sodium: 144 mEq/L (ref 136–145)

## 2014-03-21 LAB — GFR: EGFR: >60

## 2014-03-21 NOTE — Progress Notes (Signed)
PROGRESS NOTE    Date Time: 03/21/2014 3:42 PM  Patient Name: [name], [name]      Subjective:   Alert, complaining of Rt knee pain on movement.    Medications:     Current Facility-Administered Medications   Medication Dose Route Frequency   . amLODIPine  5 mg Oral Daily   . aspirin EC  325 mg Oral Daily   . clindamycin  900 mg Intravenous Q8H SCH   . cloNIDine  0.1 mg Oral Daily   . enoxaparin  40 mg Subcutaneous Daily   . ertapenem  1,000 mg Intravenous Q24H SCH   . gabapentin  300 mg Oral Q12H SCH   . hydrochlorothiazide  25 mg Oral Daily   . multivitamin  1 tablet Oral Daily   . nebivolol  5 mg Oral Daily   . potassium chloride  20 mEq Oral Daily   . sodium hypochlorite   Apply externally Once   . sodium hypochlorite   Apply externally Daily   . tiotropium  18 mcg Inhalation QAM       Review of Systems:   Review of Systems -  General ROS: negative for fever or chills  Respiratory ROS: no cough or shortness of breath  Cardiovascular ROS: no chest pain or palpitations  Gastrointestinal ROS: no abdominal pain, nausea or vomiting.  Genito-Urinary ROS: no hematuria  Musculoskeletal ROS: positive for - pain in leg - right  Neurological ROS:: no focal weakness, numbness or tingling.       Physical Exam:     Filed Vitals:    03/21/14 1401   BP: 116/62   Pulse: 66   Temp: 98.8 F (37.1 C)   Resp: 16   SpO2: 94%       Intake and Output Summary (Last 24 hours) at Date Time    Intake/Output Summary (Last 24 hours) at 03/21/14 1542  Last data filed at 03/21/14 0900   Gross per 24 hour   Intake      0 ml   Output    450 ml   Net   -450 ml     General appearance - alert, well appearing and in no distress  Mental status - alert, fully oriented  Neck - supple, no  adenopathy  Chest - clear to auscultation  Heart - RRR, no murmur.  Abdomen - soft, nontender, nondistended  Extremities - tenderness Rt leg with pedal edema, wound vac in place  Neuo- no focal deficit.        Labs:     Results    Procedure Component Value Units  Date/Time    CULTURE BLOOD AEROBIC AND ANAEROBIC [[id number]] Collected:  03/20/14 0459    Specimen Information:  Blood, Venipuncture Updated:  03/21/14 1021    Narrative:      ORDER#: [id number]                                    ORDERED BY: Tillie Rung  SOURCE: Blood, Venipuncture peripheral bld cx to docuCOLLECTED:  03/20/14 04:59  ANTIBIOTICS AT COLL.:                                RECEIVED :  03/20/14 09:52  Culture Blood Aerobic and Anaerobic        PRELIM      03/21/14 10:21  03/21/14  No Growth after 1 day/s of incubation.      CULTURE BLOOD AEROBIC AND ANAEROBIC [[id number]] Collected:  03/20/14 0459    Specimen Information:  Blood, Venipuncture Updated:  03/21/14 1021    Narrative:      ORDER#: [id number]                                    ORDERED BY: Tillie Rung  SOURCE: Blood, Venipuncture peripheral blood culture COLLECTED:  03/20/14 04:59  ANTIBIOTICS AT COLL.:                                RECEIVED :  03/20/14 09:52  Culture Blood Aerobic and Anaerobic        PRELIM      03/21/14 10:21  03/21/14   No Growth after 1 day/s of incubation.      CBC WITH MANUAL DIFFERENTIAL [[id number]]  (Abnormal) Collected:  03/21/14 0539     WBC 10.69 x10 3/uL Updated:  03/21/14 0703     RBC 3.28 ([name]) x10 6/uL      Hgb 8.7 ([name]) g/dL      Hematocrit 08.6 ([name]) %      MCV 83.8 fL      MCH 26.5 ([name]) pg      MCHC 31.6 ([name]) g/dL      RDW 15 %      Platelets 153 x10 3/uL      MPV 11.4 fL      Segmented Neutrophils 75 %      Band Neutrophils 14 %      Lymphocytes Manual 5 %      Monocytes Manual 5 %      Eosinophils Manual 1 %      Basophils Manual 0 %      Nucleated RBC 0 /100 WBC      Abs Seg Manual 8.02 x10 3/uL      Bands Absolute 1.50 (H) x10 3/uL      Absolute Lymph Manual 0.53 x10 3/uL      Monocytes Absolute 0.53 x10 3/uL      Absolute Eos Manual 0.11 x10 3/uL      Absolute Baso Manual 0.00 x10 3/uL      Cell Morphology: Abnormal (A)      Hypochromia =1+ (A)     Comprehensive metabolic panel [[id number]]  (Abnormal)  Collected:  03/21/14 0539    Specimen Information:  Blood Updated:  03/21/14 0620     Glucose 114 (H) mg/dL      BUN 21 (H) mg/dL      Creatinine 0.8 mg/dL      CALCIUM 8.4 mg/dL      Sodium 528 mEq/[name]      Potassium 3.2 ([name]) mEq/[name]      Chloride 106 mEq/[name]      CO2 27 mEq/[name]      Protein, Total 6.1 g/dL      Albumin 2.6 ([name]) g/dL      AST (SGOT) 20 U/[name]      ALT 15 U/[name]      Alkaline Phosphatase 71 U/[name]      Bilirubin, Total 0.7 mg/dL      Globulin 3.5 g/dL      Albumin/Globulin Ratio 0.7 ([name])     GFR [[id number]] Collected:  03/21/14 0539     EGFR >60.0 Updated:  03/21/14  [id number]    Blood culture #2 [[id number]] Collected:  03/18/14 1742    Specimen Information:  Blood, Intravenous Line Updated:  03/20/14 2349    Narrative:      Gram stain Results called to [id number] by [id number]    .  Readback confirmed, by  [id number] on 03/19/2014 at 07:59  ORDER#: [id number]                                    ORDERED BY: Lovie Macadamia  SOURCE: Blood, Intravenous Line IV                   COLLECTED:  03/18/14 17:42  ANTIBIOTICS AT COLL.:                                RECEIVED :  03/18/14 20:27  Gram stain Results called to [id number] by [id number]    .  Readback confirmed, by [id number] on 03/19/2014 at 07:59  Culture Blood Aerobic and Anaerobic        PRELIM      03/20/14 23:49   +  03/19/14   Anaerobic Blood Culture Positive in less than 24 hrs             Gram Stain Shows: Gram positive cocci in chains             Resembling Streptococcus species             Identification and susceptibility to follow  03/20/14   Aerobic culture no growth to date, final report to follow  03/20/14   Culture Positive for Streptococcus agalactiae - (Group B)      _____________________________________________________________________________                                 Strep Group B    ANTIBIOTICS                     MIC  INTRP      _____________________________________________________________________________  Amoxicillin                   <=0.25   S        Cefepime                       <=0.062  S        Cefotaxime                    <=0.062  S        Ceftriaxone                   <=0.062  S        Clindamycin                   <=0.031  S        Erythromycin                     2     R        Levofloxacin                   <=0.5   S        Linezolid  1     S        Penicillin                    <=0.031  S        Vancomycin                    <=0.25   S        _____________________________________________________________________________            S=SUSCEPTIBLE     I=INTERMEDIATE     R=RESISTANT                            N/S=NON-SUSCEPTIBLE  _____________________________________________________________________________      Blood culture #1 [[id number]] Collected:  03/18/14 1742    Specimen Information:  Blood, Intravenous Line Updated:  03/20/14 2121    Narrative:      ORDER#: [id number]                                    ORDERED BY: Lovie Macadamia  SOURCE: Blood, Intravenous Line IV                   COLLECTED:  03/18/14 17:42  ANTIBIOTICS AT COLL.:                                RECEIVED :  03/18/14 20:27  Culture Blood Aerobic and Anaerobic        PRELIM      03/20/14 21:21  03/19/14   No Growth after 1 day/s of incubation.  03/20/14   No Growth after 2 day/s of incubation.      Glucose Whole Blood - POCT [[id number]]  (Abnormal) Collected:  03/20/14 1623     POCT - Glucose Whole blood 128 (H) mg/dL Updated:  08/65/78 4696          Rads:   Radiological Procedure reviewed.                Assessment:     Patient Active Problem List   Diagnosis   . Total knee replacement status   . Low back pain   . Chronic obstructive pulmonary disease   . Hypertension   . Debility following multiple R knee revisions   . Non-healing surgical wound- right knee    . Fever   . Leukocytosis   . Anemia   . Hypokalemia   . Chronic infection of prosthetic knee   . COPD (chronic obstructive pulmonary disease)   . HTN (hypertension)           Plan:   The plan for this patient will be to continue  current medication, IV antibiotic, wound vac and wound care as per Infectious Disease   and Plastic Surgery.        Signed by: Ramon Dredge, MD  03/21/2014, 3:42 PM

## 2014-03-21 NOTE — Plan of Care (Signed)
Problem: Health Promotion  Goal: Knowledge - disease process  Extent of understanding conveyed about a specific disease process.   Outcome: Progressing  Patient is alert and orientedx4; R knee connected with wound vac @ , tolerating IV Abx, afebrile, VSS;     Problem: Safety  Goal: Patient will be free from injury during hospitalization  Outcome: Progressing  Patient aware of safety prec, turn and reposition as needed, hourly rounding provided;     Problem: Pain  Goal: Patient's pain/discomfort is manageable  Outcome: Progressing  Patient complained of paiin on R leg, prn PO pain medication given, floated heels at all times; verbalized relief and pain control; will cont to reassess and monitor pain;

## 2014-03-21 NOTE — Plan of Care (Signed)
Problem: Psychosocial and Spiritual Needs  Goal: Demonstrates ability to cope with hospitalization/illness  Intervention: Provide quiet environment  Door kept closed as requested by patient during the night,

## 2014-03-21 NOTE — Progress Notes (Signed)
INFECTIOUS DISEASES PROGRESS NOTE    Date Time: 03/21/2014 11:08 PM  Patient Name: [name],[name]      Assessment and Recommendations:     Patient Active Problem List   Diagnosis   . Total knee replacement status   . Low back pain   . Chronic obstructive pulmonary disease   . Hypertension   . Debility following multiple R knee revisions   . Non-healing surgical wound- right knee    . Fever   . Leukocytosis   . Anemia   . Hypokalemia   . Chronic infection of prosthetic knee   . COPD (chronic obstructive pulmonary disease)   . HTN (hypertension)     The patient is a 78 y.o. female admitted on 03/18/2014 with sepsis,  Right leg wound infection, cellulitis.    Admission blood cx from 6/10 has grown group b strep    Repeat bld cxs from 6/12 ngsf     Pre admission wound cx with esbl e. Coli and group c strep  (taken from apex of tkr wound )-  This wound is 1.4 cm deep  And does not probe to hardware.  It is new and just developed  After long adherent eschar fell off a couple weeks ago     Pt also has a slow to heal flap wound over right knee  Spacer (s/p 2nd spacer and right medial gastroc flap 09/22/13)  Which has been receiving wound vac therapy since 12/14    I would like to think that the group b strep bacteremia  originated from Right leg, but wound cx revealed group c strep (not group  b strep) and esbl e. Coli.    I have recultured the apex incisional wound, but wound under  The vac has not been cx'd     Can continue Invanz alone  Taconite clindamycin.    Culture flap wound under vac ordered.     I have ordered a picc line and echo   Anticipate minimum of 2 weeks of iv treatment, but  May extend to 4 wks     Subjective:   Feeling better     Antibiotics:   Invanz 6/10  Clindamycin 6/10--->off  vanco x 1 6/10    Medications:     Current Facility-Administered Medications   Medication Dose Route Frequency   . amLODIPine  5 mg Oral Daily   . aspirin EC  325 mg Oral Daily   . clindamycin  900 mg Intravenous Q8H SCH   . cloNIDine   0.1 mg Oral Daily   . enoxaparin  40 mg Subcutaneous Daily   . ertapenem  1,000 mg Intravenous Q24H SCH   . gabapentin  300 mg Oral Q12H SCH   . hydrochlorothiazide  25 mg Oral Daily   . multivitamin  1 tablet Oral Daily   . nebivolol  5 mg Oral Daily   . potassium chloride  20 mEq Oral Daily   . sodium hypochlorite   Apply externally Once   . sodium hypochlorite   Apply externally Daily   . tiotropium  18 mcg Inhalation QAM       Central Access/Endovascular Hardware:     Active PICC Line / CVC Line / PIV Line / Drain / Airway / Intraosseous Line / Epidural Line / ART Line / Line / Wound / Pressure Ulcer / NG/OG Tube    Name:   Placement date:   Placement time:   Site:   Days:    OnQ  Pump (Incisional) 08/25/13 knee Right  08/25/13   0939   knee   208    Peripheral IV 09/24/13 Left Hand  09/24/13   0730   Hand   178    Peripheral IV 03/18/14 Right Antecubital  03/18/14   1740   Antecubital   3    Peripheral IV 03/18/14 Right Hand  03/18/14   1920   Hand   3    Closed/Suction Drain Right Knee 10 Fr.  09/05/13   1401   Knee   197    Negative Pressure Wound Therapy Knee Right        Knee       Epidural Catheter              Incision Site 09/05/13 Knee Right  09/05/13   1437     197    Incision Site 09/15/13 Knee Right  09/15/13   1517     187    Incision Site 09/22/13 Leg Right  09/22/13   1901     180          Review of Systems:   Patient is without fevers, chills, nausea, vomiting, diarrhea, abdominal pain, cough, dyspnea, headache, rash.  Right knee pain well controlled     Physical Exam:     Filed Vitals:    03/21/14 0831 03/21/14 1401 03/21/14 2056 03/21/14 2135   BP:  116/62 117/62 134/58   Pulse:  66 70 70   Temp:  98.8 F (37.1 C) 98.8 F (37.1 C)    TempSrc:  Oral     Resp:  16 16    Height:       Weight:       SpO2: 94% 94% 95%        Temp (24hrs), Avg:98.4 F (36.9 C), Min:97.5 F (36.4 C), Max:98.8 F (37.1 C)        In no apparent distress  Sclera anicteric, conjunctivae clear  Oral pharynx s erythema,  exudate, or other significant lesions  No Thrush  Neck supple, s lymphadenopathy  Lungs Clear  Heart Regular rate and rhythm s murmurs, rubs, or gallops  Abdomen Soft, non tender, non distended, normal bowel sounds  Back No Costovertebral angle tenderness, no point tenderness along the spine  Extremities No Cyanosis, no clubbing,   +2 edema right leg - with large spacer in knee  Apex of right knee tkr wound -( located on right distal )  Thigh c  1.4x1.4 cm deep wnd,   Reasonably clean, no purulence.  No surrounding induration  Or fluctuance.  Knee wound under vac - not examined   Palpable pedal pulses, no ischemic changes   Neurologic exam is grossly non-focal  Skin without rash     Labs:     Results    Procedure Component Value Units Date/Time    Blood culture #1 [[id number]] Collected:  03/18/14 1742    Specimen Information:  Blood, Intravenous Line Updated:  03/21/14 2121    Narrative:      ORDER#: [id number]                                    ORDERED BY: Lovie Macadamia  SOURCE: Blood, Intravenous Line IV                   COLLECTED:  03/18/14 17:42  ANTIBIOTICS AT COLL.:  RECEIVED :  03/18/14 20:27  Culture Blood Aerobic and Anaerobic        PRELIM      03/21/14 21:21  03/19/14   No Growth after 1 day/s of incubation.  03/20/14   No Growth after 2 day/s of incubation.  03/21/14   No Growth after 3 day/s of incubation.      CULTURE BLOOD AEROBIC AND ANAEROBIC [[id number]] Collected:  03/20/14 0459    Specimen Information:  Blood, Venipuncture Updated:  03/21/14 1021    Narrative:      ORDER#: [id number]                                    ORDERED BY: Tillie Rung  SOURCE: Blood, Venipuncture peripheral bld cx to docuCOLLECTED:  03/20/14 04:59  ANTIBIOTICS AT COLL.:                                RECEIVED :  03/20/14 09:52  Culture Blood Aerobic and Anaerobic        PRELIM      03/21/14 10:21  03/21/14   No Growth after 1 day/s of incubation.      CULTURE BLOOD AEROBIC AND ANAEROBIC  [[id number]] Collected:  03/20/14 0459    Specimen Information:  Blood, Venipuncture Updated:  03/21/14 1021    Narrative:      ORDER#: [id number]                                    ORDERED BY: Tillie Rung  SOURCE: Blood, Venipuncture peripheral blood culture COLLECTED:  03/20/14 04:59  ANTIBIOTICS AT COLL.:                                RECEIVED :  03/20/14 09:52  Culture Blood Aerobic and Anaerobic        PRELIM      03/21/14 10:21  03/21/14   No Growth after 1 day/s of incubation.      CBC WITH MANUAL DIFFERENTIAL [[id number]]  (Abnormal) Collected:  03/21/14 0539     WBC 10.69 x10 3/uL Updated:  03/21/14 0703     RBC 3.28 ([name]) x10 6/uL      Hgb 8.7 ([name]) g/dL      Hematocrit 54.2 ([name]) %      MCV 83.8 fL      MCH 26.5 ([name]) pg      MCHC 31.6 ([name]) g/dL      RDW 15 %      Platelets 153 x10 3/uL      MPV 11.4 fL      Segmented Neutrophils 75 %      Band Neutrophils 14 %      Lymphocytes Manual 5 %      Monocytes Manual 5 %      Eosinophils Manual 1 %      Basophils Manual 0 %      Nucleated RBC 0 /100 WBC      Abs Seg Manual 8.02 x10 3/uL      Bands Absolute 1.50 (H) x10 3/uL      Absolute Lymph Manual 0.53 x10 3/uL      Monocytes Absolute 0.53 x10 3/uL      Absolute  Eos Manual 0.11 x10 3/uL      Absolute Baso Manual 0.00 x10 3/uL      Cell Morphology: Abnormal (A)      Hypochromia =1+ (A)     Comprehensive metabolic panel [[id number]]  (Abnormal) Collected:  03/21/14 0539    Specimen Information:  Blood Updated:  03/21/14 0620     Glucose 114 (H) mg/dL      BUN 21 (H) mg/dL      Creatinine 0.8 mg/dL      CALCIUM 8.4 mg/dL      Sodium 191 mEq/[name]      Potassium 3.2 ([name]) mEq/[name]      Chloride 106 mEq/[name]      CO2 27 mEq/[name]      Protein, Total 6.1 g/dL      Albumin 2.6 ([name]) g/dL      AST (SGOT) 20 U/[name]      ALT 15 U/[name]      Alkaline Phosphatase 71 U/[name]      Bilirubin, Total 0.7 mg/dL      Globulin 3.5 g/dL      Albumin/Globulin Ratio 0.7 ([name])     GFR [[id number]] Collected:  03/21/14 0539     EGFR >60.0 Updated:  03/21/14 0620    Blood  culture #2 [[id number]] Collected:  03/18/14 1742    Specimen Information:  Blood, Intravenous Line Updated:  03/20/14 2349    Narrative:      Gram stain Results called to [id number] by [id number]    .  Readback confirmed, by  [id number] on 03/19/2014 at 07:59  ORDER#: [id number]                                    ORDERED BY: Lovie Macadamia  SOURCE: Blood, Intravenous Line IV                   COLLECTED:  03/18/14 17:42  ANTIBIOTICS AT COLL.:                                RECEIVED :  03/18/14 20:27  Gram stain Results called to [id number] by [id number]    .  Readback confirmed, by [id number] on 03/19/2014 at 07:59  Culture Blood Aerobic and Anaerobic        PRELIM      03/20/14 23:49   +  03/19/14   Anaerobic Blood Culture Positive in less than 24 hrs             Gram Stain Shows: Gram positive cocci in chains             Resembling Streptococcus species             Identification and susceptibility to follow  03/20/14   Aerobic culture no growth to date, final report to follow  03/20/14   Culture Positive for Streptococcus agalactiae - (Group B)      _____________________________________________________________________________                                 Strep Group B    ANTIBIOTICS                     MIC  INTRP      _____________________________________________________________________________  Amoxicillin                   <=  0.25   S        Cefepime                      <=0.062  S        Cefotaxime                    <=0.062  S        Ceftriaxone                   <=0.062  S        Clindamycin                   <=0.031  S        Erythromycin                     2     R        Levofloxacin                   <=0.5   S        Linezolid                        1     S        Penicillin                    <=0.031  S        Vancomycin                    <=0.25   S        _____________________________________________________________________________            S=SUSCEPTIBLE     I=INTERMEDIATE     R=RESISTANT                             N/S=NON-SUSCEPTIBLE  _____________________________________________________________________________            Recent Labs  Lab 03/21/14  0539   WBC 10.69   Hgb 8.7*   Hematocrit 27.5*   Platelets 153        Recent Labs  Lab 03/21/14  0539   Sodium 144   Potassium 3.2*   Chloride 106   CO2 27   BUN 21*   Creatinine 0.8   CALCIUM 8.4   Albumin 2.6*   Protein, Total 6.1   Bilirubin, Total 0.7   Alkaline Phosphatase 71   ALT 15   AST (SGOT) 20   Glucose 114*       Recent Labs  Lab 03/18/14  1742   PT INR 1.3*          Rads:     Radiology Results (24 Hour)    ** No results found for the last 24 hours. **          Signed by: Ray Church, MD

## 2014-03-22 LAB — BASIC METABOLIC PANEL
BUN: 19 mg/dL (ref 7–19)
CO2: 28 mEq/L (ref 22–29)
Calcium: 8.4 mg/dL (ref 7.9–10.2)
Chloride: 105 mEq/L (ref 100–111)
Creatinine: 0.8 mg/dL (ref 0.6–1.0)
Glucose: 101 mg/dL — ABNORMAL HIGH (ref 70–100)
Potassium: 3.2 mEq/L — ABNORMAL LOW (ref 3.5–5.1)
Sodium: 144 mEq/L (ref 136–145)

## 2014-03-22 LAB — GFR: EGFR: >60

## 2014-03-22 MED ORDER — HYDROCODONE-ACETAMINOPHEN 10-325 MG PO TABS
1.0000 | ORAL_TABLET | Freq: Four times a day (QID) | ORAL | Status: DC | PRN
Start: 2014-03-22 — End: 2014-03-31
  Administered 2014-03-22 – 2014-03-31 (×23): 1 via ORAL
  Filled 2014-03-22 (×23): qty 1

## 2014-03-22 MED ORDER — GABAPENTIN 300 MG PO CAPS
300.0000 mg | ORAL_CAPSULE | Freq: Three times a day (TID) | ORAL | Status: DC
Start: 2014-03-22 — End: 2014-03-31
  Administered 2014-03-22 – 2014-03-31 (×27): 300 mg via ORAL
  Filled 2014-03-22 (×27): qty 1

## 2014-03-22 MED ORDER — POTASSIUM CHLORIDE CRYS ER 20 MEQ PO TBCR
20.0000 meq | EXTENDED_RELEASE_TABLET | Freq: Two times a day (BID) | ORAL | Status: DC
Start: 2014-03-22 — End: 2014-03-31
  Administered 2014-03-22 – 2014-03-31 (×18): 20 meq via ORAL
  Filled 2014-03-22 (×18): qty 1

## 2014-03-22 NOTE — Progress Notes (Signed)
Pt a/ox3. Meds/meals given and tolerated well. Pain level tolerable at this time. Wound vac intact. Pt resting with family at bedside. Continue current plan of care.

## 2014-03-22 NOTE — Plan of Care (Signed)
Problem: Tissue integrity  Goal: Damaged tissue is healing and protected  Outcome: Progressing  Intervention: Reposition patient every 2 hours and PRN unless able to self reposition.  Encourage patient to turn in bed frequently and to do active range of motion while in bed to redistribute pressure and improve circulation  Intervention: Keep intact skin clean and dry.  Assist in cleansing skin, especially in the perineum to avoid moisture collection in skin folds   Intervention: Consult/Collaborate with Wound Care Nurse  Wound care nurse on board with care of patient's right leg wounds.

## 2014-03-22 NOTE — Progress Notes (Signed)
PROGRESS NOTE    Date Time: 03/22/2014 11:15 AM  Patient Name: [name], [name]      Subjective:    Complaining of Rt leg pain, worse when touched or with movement.    Medications:     Current Facility-Administered Medications   Medication Dose Route Frequency   . amLODIPine  5 mg Oral Daily   . aspirin EC  325 mg Oral Daily   . clindamycin  900 mg Intravenous Q8H SCH   . cloNIDine  0.1 mg Oral Daily   . enoxaparin  40 mg Subcutaneous Daily   . ertapenem  1,000 mg Intravenous Q24H SCH   . gabapentin  300 mg Oral Q12H SCH   . hydrochlorothiazide  25 mg Oral Daily   . multivitamin  1 tablet Oral Daily   . nebivolol  5 mg Oral Daily   . potassium chloride  20 mEq Oral Daily   . sodium hypochlorite   Apply externally Once   . sodium hypochlorite   Apply externally Daily   . tiotropium  18 mcg Inhalation QAM       Review of Systems:   Review of Systems -  General ROS: negative for fever or chills  Respiratory ROS: no cough or shortness of breath  Cardiovascular ROS: no chest pain or palpitations  Gastrointestinal ROS: no abdominal pain, nausea or vomiting.  Genito-Urinary ROS: no hematuria  Musculoskeletal ROS: positive for - pain in leg - right  Neurological ROS:: no focal weakness, numbness or tingling.     Physical Exam:     Filed Vitals:    03/22/14 0910   BP:    Pulse:    Temp:    Resp:    SpO2: 95%       Intake and Output Summary (Last 24 hours) at Date Time  No intake or output data in the 24 hours ending 03/22/14 1115    General appearance - alert, well appearing and in no distress  Mental status - alert, fully oriented  Neck - supple, no adenopathy  Chest - clear to auscultation  Heart - RRR, no murmur.  Abdomen - soft, nontender, nondistended  Extremities -warmth and  tenderness Rt leg with edema, wound vac in place  Neuo- no focal deficit.    Labs:     Results    Procedure Component Value Units Date/Time    CULTURE BLOOD AEROBIC AND ANAEROBIC [[id number]] Collected:  03/20/14 0459    Specimen Information:  Blood,  Venipuncture Updated:  03/22/14 1021    Narrative:      ORDER#: [id number]                                    ORDERED BY: Tillie Rung  SOURCE: Blood, Venipuncture peripheral blood culture COLLECTED:  03/20/14 04:59  ANTIBIOTICS AT COLL.:                                RECEIVED :  03/20/14 09:52  Culture Blood Aerobic and Anaerobic        PRELIM      03/22/14 10:21  03/21/14   No Growth after 1 day/s of incubation.  03/22/14   No Growth after 2 day/s of incubation.      CULTURE BLOOD AEROBIC AND ANAEROBIC [[id number]] Collected:  03/20/14 0459    Specimen Information:  Blood, Venipuncture Updated:  03/22/14 1021    Narrative:      ORDER#: [id number]                                    ORDERED BY: Tillie Rung  SOURCE: Blood, Venipuncture peripheral bld cx to docuCOLLECTED:  03/20/14 04:59  ANTIBIOTICS AT COLL.:                                RECEIVED :  03/20/14 09:52  Culture Blood Aerobic and Anaerobic        PRELIM      03/22/14 10:21  03/21/14   No Growth after 1 day/s of incubation.  03/22/14   No Growth after 2 day/s of incubation.      Wound Culture & Gram Stain [[id number]] Collected:  03/22/14 0136    Specimen Information:  Wound / Wound Updated:  03/22/14 0913    Narrative:      ORDER#: [id number]                                    ORDERED BY: Tillie Rung  SOURCE: Wound right thigh wound                      COLLECTED:  03/22/14 01:36  ANTIBIOTICS AT COLL.:                                RECEIVED :  03/22/14 06:01  Stain, Gram                                FINAL       03/22/14 09:13  03/22/14   Few Squamous epithelial cells             No WBCs or organisms seen  Culture Wound                              PENDING      Basic Metabolic Panel [[id number]]  (Abnormal) Collected:  03/22/14 0441    Specimen Information:  Blood Updated:  03/22/14 0535     Glucose 101 (H) mg/dL      BUN 19 mg/dL      Creatinine 0.8 mg/dL      CALCIUM 8.4 mg/dL      Sodium 629 mEq/[name]      Potassium 3.2 ([name]) mEq/[name]      Chloride 105  mEq/[name]      CO2 28 mEq/[name]     GFR [[id number]] Collected:  03/22/14 0441     EGFR >60.0 Updated:  03/22/14 0535    Blood culture #2 [[id number]] Collected:  03/18/14 1742    Specimen Information:  Blood, Intravenous Line Updated:  03/21/14 2319    Narrative:      Gram stain Results called to [id number] by [id number]    .  Readback confirmed, by  [id number] on 03/19/2014 at 07:59  ORDER#: [id number]  ORDERED BY: HOMEYER, MICHAE  SOURCE: Blood, Intravenous Line IV                   COLLECTED:  03/18/14 17:42  ANTIBIOTICS AT COLL.:                                RECEIVED :  03/18/14 20:27  Gram stain Results called to [id number] by [id number]    .  Readback confirmed, by [id number] on 03/19/2014 at 07:59  Culture Blood Aerobic and Anaerobic        PRELIM      03/21/14 23:19   +  03/19/14   Anaerobic Blood Culture Positive in less than 24 hrs             Gram Stain Shows: Gram positive cocci in chains             Resembling Streptococcus species  03/20/14   Aerobic culture no growth to date, final report to follow  03/20/14   Culture Positive for Streptococcus agalactiae - (Group B)      _____________________________________________________________________________                                 Strep Group B    ANTIBIOTICS                     MIC  INTRP      _____________________________________________________________________________  Amoxicillin                   <=0.25   S        Cefepime                      <=0.062  S        Cefotaxime                    <=0.062  S        Ceftriaxone                   <=0.062  S        Clindamycin                   <=0.031  S        Erythromycin                     2     R        Levofloxacin                   <=0.5   S        Linezolid                        1     S        Penicillin                    <=0.031  S        Vancomycin                    <=0.25   S        _____________________________________________________________________________            S=SUSCEPTIBLE      I=INTERMEDIATE  R=RESISTANT                            N/S=NON-SUSCEPTIBLE  _____________________________________________________________________________      Blood culture #1 [[id number]] Collected:  03/18/14 1742    Specimen Information:  Blood, Intravenous Line Updated:  03/21/14 2121    Narrative:      ORDER#: [id number]                                    ORDERED BY: Lovie Macadamia  SOURCE: Blood, Intravenous Line IV                   COLLECTED:  03/18/14 17:42  ANTIBIOTICS AT COLL.:                                RECEIVED :  03/18/14 20:27  Culture Blood Aerobic and Anaerobic        PRELIM      03/21/14 21:21  03/19/14   No Growth after 1 day/s of incubation.  03/20/14   No Growth after 2 day/s of incubation.  03/21/14   No Growth after 3 day/s of incubation.            Rads:   Radiological Procedure reviewed.                Assessment:     Patient Active Problem List   Diagnosis   . Total knee replacement status   . Low back pain   . Chronic obstructive pulmonary disease   . Hypertension   . Debility following multiple R knee revisions   . Non-healing surgical wound- right knee    . Fever   . Leukocytosis   . Anemia   . Hypokalemia   . Chronic infection of prosthetic knee   . COPD (chronic obstructive pulmonary disease)   . HTN (hypertension)           Plan:   The plan for this patient will be to continue current medication and treatment. continue antibiotic and wound care as per Infectious Disease and Plastic Surgery. Optimize analgesic regimen. Correct K deficit and monitor level.        Signed by: Ramon Dredge, MD  03/22/2014, 11:15 AM

## 2014-03-22 NOTE — Plan of Care (Signed)
Problem: Safety  Goal: Patient will be free from injury during hospitalization  Intervention: Provide and maintain safe environment  Keep area clutter free from potential falls and keep patient's belongings close in reach.

## 2014-03-23 MED ORDER — HYDROMORPHONE HCL PF 1 MG/ML IJ SOLN
1.0000 mg | Freq: Once | INTRAMUSCULAR | Status: DC | PRN
Start: 2014-03-23 — End: 2014-03-31
  Filled 2014-03-23: qty 1

## 2014-03-23 NOTE — Plan of Care (Signed)
Problem: Moderate/High Fall Risk Score >/=15  Goal: Patient will remain free of falls  Outcome: Progressing  Pt encouraged to use call light , pt aware    Problem: Health Promotion  Goal: Knowledge - disease process  Extent of understanding conveyed about a specific disease process.   Outcome: Progressing  Discussed with pt regarding wound vac and dsg changes during rounds, pt acceptable to POC    Problem: Safety  Goal: Patient will be free from injury during hospitalization  Outcome: Progressing    Problem: Pain  Goal: Patient's pain/discomfort is manageable  Outcome: Progressing  Pt medicated for Leg pain earlier with good results. States pain at acceptable level at this time. Discussed pain meds with Dr. Vance Gather, see new orders.    Problem: Psychosocial and Spiritual Needs  Goal: Demonstrates ability to cope with hospitalization/illness  Outcome: Progressing    Problem: Tissue integrity  Goal: Damaged tissue is healing and protected  Outcome: Not Progressing  DSG change to be done by wound care RN,

## 2014-03-23 NOTE — Progress Notes (Signed)
Infectious Diseases  Progress Note     Impression:   46 yof a/w bacteremic sepsis of GBS from unclear origin but perhaps the GU tract. See previous notes re other Cx  No vegetations on Echo. F/U BCx on 03/20/14 are NGTD.    Recommendations:   Pincus Sanes. 4 weeks after neg BCx at present.  D/W Pt and family.    Subjective:   Better    Review of Systems  Patient without nausea, vomiting, diarrhea, rash, cough, shortness of breath, abdominal or chest pain.      Objective:   BP 123/60   Pulse 71   Temp(Src) 98.2 F (36.8 C) (Oral)   Resp 17   Ht 1.6 m (5\' 3" )   Wt 102.195 kg (225 lb 4.8 oz)   BMI 39.92 kg/m2     SpO2 100%     Temp (24hrs), Avg:98.5 F (36.9 C), Min:97.7 F (36.5 C), Max:99.5 F (37.5 C)      General: awake, alert, oriented x 3; no acute distress.  Cardiovascular: regular rate and rhythm, no murmurs, rubs or gallops  Lungs: clear to auscultation bilaterally, without wheezing, rhonchi, or rales  Abdomen: soft, non-tender, non-distended; no palpable masses, no hepatosplenomegaly, normoactive bowel sounds, no rebound or guarding  Extremities: no clubbing, cyanosis, prox wound is clean.          Labs:    Recent Labs  Lab 03/21/14  0539 03/19/14  0741 03/18/14  1742   WBC 10.69 12.80* 19.95*   Hgb 8.7* 9.4* 10.3*   Hematocrit 27.5* 30.0* 32.6*   Platelets 153 162 201       Recent Labs  Lab 03/22/14  0441 03/21/14  0539 03/20/14  0459 03/19/14  0741 03/18/14  1742   Sodium 144 144 141 143 142   Potassium 3.2* 3.2* 3.9 3.0* 2.9*   Chloride 105 106 106 108 103   CO2 28 27 22 26 29    BUN 19 21* 23* 22* 22*   Creatinine 0.8 0.8 0.9 0.9 0.9   CALCIUM 8.4 8.4 8.4 8.3 9.3   Albumin  --  2.6*  --  2.9* 3.5   Protein, Total  --  6.1  --  6.2 7.1   Bilirubin, Total  --  0.7  --  0.8 0.8   Alkaline Phosphatase  --  71  --  62 55   ALT  --  15  --  16 16   AST (SGOT)  --  20  --  30 30   Glucose 101* 114* 102* 108* 138*

## 2014-03-23 NOTE — Progress Notes (Signed)
PROGRESS NOTE    Date Time: 03/23/2014 11:29 AM  Patient Name: [name], [name]      Subjective:   Seen and examined. Pain under control except when u touch her right leg.     Medications:     Current Facility-Administered Medications   Medication Dose Route Frequency   . amLODIPine  5 mg Oral Daily   . aspirin EC  325 mg Oral Daily   . cloNIDine  0.1 mg Oral Daily   . enoxaparin  40 mg Subcutaneous Daily   . ertapenem  1,000 mg Intravenous Q24H SCH   . gabapentin  300 mg Oral TID   . hydrochlorothiazide  25 mg Oral Daily   . multivitamin  1 tablet Oral Daily   . nebivolol  5 mg Oral Daily   . potassium chloride  20 mEq Oral BID   . sodium hypochlorite   Apply externally Once   . sodium hypochlorite   Apply externally Daily   . tiotropium  18 mcg Inhalation QAM       Review of Systems:   Review of Systems -  General ROS: negative for fever or chills  Respiratory ROS: no cough or shortness of breath  Cardiovascular ROS: no chest pain or palpitations  Gastrointestinal ROS: no abdominal pain, nausea or vomiting.  Genito-Urinary ROS: no hematuria  Musculoskeletal ROS: positive for - pain in leg - right but controlled with meds  Neurological ROS:: no focal weakness, numbness or tingling.       Physical Exam:     Filed Vitals:    03/23/14 0905   BP:    Pulse:    Temp:    Resp:    SpO2: 99%       Intake and Output Summary (Last 24 hours) at Date Time    Intake/Output Summary (Last 24 hours) at 03/23/14 1129  Last data filed at 03/23/14 1100   Gross per 24 hour   Intake      0 ml   Output    550 ml   Net   -550 ml     General appearance - alert, well appearing and in no distress  Mental status - alert, fully oriented  Neck - supple, no  adenopathy  Chest - clear to auscultation  Heart - RRR, no murmur.  Abdomen - soft, nontender, nondistended  Extremities - tenderness Rt leg with pedal edema, wound vac in place, drsg on right thigh  Neuo- no focal deficit.        Labs:     Results    Procedure Component Value Units Date/Time     CULTURE BLOOD AEROBIC AND ANAEROBIC [[id number]] Collected:  03/20/14 0459    Specimen Information:  Blood, Venipuncture Updated:  03/23/14 1021    Narrative:      ORDER#: [id number]                                    ORDERED BY: Tillie Rung  SOURCE: Blood, Venipuncture peripheral blood culture COLLECTED:  03/20/14 04:59  ANTIBIOTICS AT COLL.:                                RECEIVED :  03/20/14 09:52  Culture Blood Aerobic and Anaerobic        PRELIM      03/23/14 10:21  03/21/14   No  Growth after 1 day/s of incubation.  03/22/14   No Growth after 2 day/s of incubation.  03/23/14   No Growth after 3 day/s of incubation.      CULTURE BLOOD AEROBIC AND ANAEROBIC [[id number]] Collected:  03/20/14 0459    Specimen Information:  Blood, Venipuncture Updated:  03/23/14 1021    Narrative:      ORDER#: [id number]                                    ORDERED BY: Tillie Rung  SOURCE: Blood, Venipuncture peripheral bld cx to docuCOLLECTED:  03/20/14 04:59  ANTIBIOTICS AT COLL.:                                RECEIVED :  03/20/14 09:52  Culture Blood Aerobic and Anaerobic        PRELIM      03/23/14 10:21  03/21/14   No Growth after 1 day/s of incubation.  03/22/14   No Growth after 2 day/s of incubation.  03/23/14   No Growth after 3 day/s of incubation.      Wound Culture & Gram Stain [[id number]] Collected:  03/22/14 0136    Specimen Information:  Wound / Wound Updated:  03/23/14 0635    Narrative:      ORDER#: [id number]                                    ORDERED BY: Tillie Rung  SOURCE: Wound right thigh wound                      COLLECTED:  03/22/14 01:36  ANTIBIOTICS AT COLL.:                                RECEIVED :  03/22/14 06:01  Stain, Gram                                FINAL       03/22/14 09:13  03/22/14   Few Squamous epithelial cells             No WBCs or organisms seen  Culture Wound                              PRELIM      03/23/14 06:35  03/23/14   Very light growth of mixed cutaneous flora      Blood  culture #1 [[id number]] Collected:  03/18/14 1742    Specimen Information:  Blood, Intravenous Line Updated:  03/22/14 2121    Narrative:      ORDER#: [id number]                                    ORDERED BY: Lovie Macadamia  SOURCE: Blood, Intravenous Line IV                   COLLECTED:  03/18/14 17:42  ANTIBIOTICS AT COLL.:  RECEIVED :  03/18/14 20:27  Culture Blood Aerobic and Anaerobic        PRELIM      03/22/14 21:21  03/19/14   No Growth after 1 day/s of incubation.  03/20/14   No Growth after 2 day/s of incubation.  03/21/14   No Growth after 3 day/s of incubation.  03/22/14   No Growth after 4 day/s of incubation.            Rads:   Radiological Procedure reviewed.                Assessment:     Patient Active Problem List   Diagnosis   . Total knee replacement status   . Low back pain   . Chronic obstructive pulmonary disease   . Hypertension   . Debility following multiple R knee revisions   . Non-healing surgical wound- right knee    . Fever   . Leukocytosis   . Anemia   . Hypokalemia   . Chronic infection of prosthetic knee   . COPD (chronic obstructive pulmonary disease)   . HTN (hypertension)     1) ESBL positive Chronic Right Leg wound/ Cellulitis    2) Hypokalemia    3) OA/TKA complicated with infection  4) Anemia - related to chronic infection  5) Hypertension- controled.  6) COPD, stable  7) Constipation      Plan:   Continue current medication, IV antibiotic,   Cont wound vac and wound care as per Infectious Disease   Plastic Surgery on consult   Repeat labs in am  Pain management        Signed by: Everlean Patterson, MD  03/23/2014, 11:29 AM

## 2014-03-23 NOTE — Plan of Care (Signed)
Problem: Tissue integrity  Goal: Damaged tissue is healing and protected  Outcome: Progressing  Bandage to right thigh wound changed, and tolerated well. Wound appeared clean, dry, and kling bandage soaked with dakins solution packed in the wound , continue monitor

## 2014-03-23 NOTE — Plan of Care (Signed)
Problem: Safety  Goal: Patient will be free from injury during hospitalization  Outcome: Progressing  Encouraged use of call bell for any assistance, tolerated well. Assisted to [facility] and voided fine without difficulty. Resting quietly, wound vac intact, continue monitor.

## 2014-03-23 NOTE — Progress Notes (Signed)
Wound Note  Changed wound vac to right knee wound, 100% pink base with granular buds, 5.2 x 1.5 x 0.3cm, sanguinous drainage, black foam applied per protocol, Hg continuous, no leaks secure. Superior wound changed, dressed with Dakins solution soaked guaze, base of wound pale pink, scan drainage.  Plan  Will change again Wednesday    Renelda Loma RN  [facility] # [phone]

## 2014-03-24 ENCOUNTER — Encounter
Admission: EM | Disposition: A | Discharge status: Skilled Nursing Facility | Payer: Self-pay | Source: Home / Self Care | Attending: Internal Medicine

## 2014-03-24 LAB — BASIC METABOLIC PANEL
BUN: 14 mg/dL (ref 7–19)
CO2: 27 mEq/L (ref 22–29)
Calcium: 8.5 mg/dL (ref 7.9–10.2)
Chloride: 105 mEq/L (ref 100–111)
Creatinine: 0.7 mg/dL (ref 0.6–1.0)
Glucose: 101 mg/dL — ABNORMAL HIGH (ref 70–100)
Potassium: 3.9 mEq/L (ref 3.5–5.1)
Sodium: 142 mEq/L (ref 136–145)

## 2014-03-24 LAB — GFR: EGFR: >60

## 2014-03-24 SURGERY — PICC LINE PLACEMENT
Anesthesia: Local | Site: Arm Upper | Laterality: Left

## 2014-03-24 MED ORDER — SODIUM CHLORIDE 0.9 % IJ SOLN
10.0000 mL | Freq: Every day | INTRAMUSCULAR | Status: DC
Start: 2014-03-24 — End: 2014-03-31
  Administered 2014-03-24 – 2014-03-31 (×8): 10 mL via INTRAVENOUS
  Filled 2014-03-24: qty 20

## 2014-03-24 NOTE — Progress Notes (Signed)
Pt got single PICC line placed on RUE up on arrival dressing was c/d/i then later dressing found soiled with small blood, drerssing changed, line flushed. Pt tolerated well.

## 2014-03-24 NOTE — Progress Notes (Signed)
Infectious Diseases  Progress Note     Impression:   25 yof a/w bacteremic sepsis of GBS from unclear origin but perhaps the GU tract. See previous notes re other Cx  No vegetations on Echo. F/U BCx on 03/20/14 are NGTD.      Recommendations:   TEE  Invanz a min of 4 weeks or through July 26th, depending on TEE    Subjective:   Better    Review of Systems  Patient without nausea, vomiting, diarrhea, rash, cough, shortness of breath, abdominal or chest pain.      Objective:   BP 117/69   Pulse 64   Temp(Src) 96.3 F (35.7 C) (Oral)   Resp 17   Ht 1.6 m (5\' 3" )   Wt 102.195 kg (225 lb 4.8 oz)   BMI 39.92 kg/m2     SpO2 96%     Temp (24hrs), Avg:97.8 F (36.6 C), Min:96.3 F (35.7 C), Max:98.6 F (37 C)      General: awake, alert, oriented x 3; no acute distress.  Cardiovascular: regular rate and rhythm, no murmurs, rubs or gallops  Lungs: clear to auscultation bilaterally, without wheezing, rhonchi, or rales  Abdomen: soft, non-tender, non-distended; no palpable masses, no hepatosplenomegaly, normoactive bowel sounds, no rebound or guarding  Extremities: no clubbing, cyanosis,   Other: vac in place        Labs:    Recent Labs  Lab 03/21/14  0539 03/19/14  0741 03/18/14  1742   WBC 10.69 12.80* 19.95*   Hgb 8.7* 9.4* 10.3*   Hematocrit 27.5* 30.0* 32.6*   Platelets 153 162 201       Recent Labs  Lab 03/24/14  0705 03/22/14  0441 03/21/14  0539  03/19/14  0741 03/18/14  1742   Sodium 142 144 144  < > 143 142   Potassium 3.9 3.2* 3.2*  < > 3.0* 2.9*   Chloride 105 105 106  < > 108 103   CO2 27 28 27   < > 26 29   BUN 14 19 21*  < > 22* 22*   Creatinine 0.7 0.8 0.8  < > 0.9 0.9   CALCIUM 8.5 8.4 8.4  < > 8.3 9.3   Albumin  --   --  2.6*  --  2.9* 3.5   Protein, Total  --   --  6.1  --  6.2 7.1   Bilirubin, Total  --   --  0.7  --  0.8 0.8   Alkaline Phosphatase  --   --  71  --  62 55   ALT  --   --  15  --  16 16   AST (SGOT)  --   --  20  --  30 30   Glucose 101* 101* 114*  < > 108* 138*   < > = values in this  interval not displayed.

## 2014-03-24 NOTE — Plan of Care (Signed)
Problem: Health Promotion  Goal: Knowledge - disease process  Extent of understanding conveyed about a specific disease process.   Outcome: Progressing  Right knee wound dressing c/d/i, attached to wound vac, canister empty, on IV Invanze and per Dr. Rebecca Eaton will continue for about four weeks, scheduled for PICC line placement today. Maintain contact isolation for ESBL in wound.   Goal: Knowledge - health resources  Extent of understanding and conveyed about healthcare resources.   Outcome: Progressing  Possible d/c to SNF, CM aware and will assist with d/c planning.    Problem: Safety  Goal: Patient will be free from injury during hospitalization  Outcome: Progressing  Assisted pt to the [facility] ,  uses FWW for transfer bed to [facility], requires mod assist with RLE to get to sitting position and extra time for safety. Encouraged to call for assist.    Problem: Pain  Goal: Patient's pain/discomfort is manageable  Outcome: Progressing  RLE very sensitive to touch and has pain, continue pain management with current pain mds regimen, pt reportes tolerable pain level. Will reassess pain.    Problem: Psychosocial and Spiritual Needs  Goal: Demonstrates ability to cope with hospitalization/illness  Outcome: Progressing    Problem: Tissue integrity  Goal: Damaged tissue is healing and protected  Outcome: Progressing  Right thigt dressing changed as instructed with 1/4 strength Denkins. Pt tolerated well.

## 2014-03-24 NOTE — Progress Notes (Signed)
PROGRESS NOTE    Date Time: 03/24/2014 2:13 PM  Patient Name: [name], [name]  Length of Stay: 6    Subjective:   I feel ok. No chill.    Medications:     Current Facility-Administered Medications   Medication Dose Route Frequency   . amLODIPine  5 mg Oral Daily   . aspirin EC  325 mg Oral Daily   . cloNIDine  0.1 mg Oral Daily   . enoxaparin  40 mg Subcutaneous Daily   . ertapenem  1,000 mg Intravenous Q24H SCH   . gabapentin  300 mg Oral TID   . hydrochlorothiazide  25 mg Oral Daily   . multivitamin  1 tablet Oral Daily   . nebivolol  5 mg Oral Daily   . potassium chloride  20 mEq Oral BID   . sodium hypochlorite   Apply externally Once   . sodium hypochlorite   Apply externally Daily   . tiotropium  18 mcg Inhalation QAM         Review of Systems:   A comprehensive review of systems was: General ROS: negative for - chills  ENT ROS: negative for - epistaxis  Respiratory ROS: negative for - hemoptysis  Cardiovascular ROS: negative for - chest pain  Gastrointestinal ROS: negative for - diarrhea  Musculoskeletal ROS: negative for - joint swelling  Neurological ROS: negative for - seizures    Physical Exam:     Filed Vitals:    03/24/14 0815   BP: 117/69   Pulse: 64   Temp: 96.3 F (35.7 C)   Resp: 17   SpO2: 96%       Intake and Output Summary (Last 24 hours) at Date Time    Intake/Output Summary (Last 24 hours) at 03/24/14 1413  Last data filed at 03/24/14 1109   Gross per 24 hour   Intake    390 ml   Output    650 ml   Net   -260 ml       Eyes - pupils equal and reactive, extraocular eye movements intact  Ears - bilateral TM's and external ear canals normal  Nose - normal and patent, no erythema, discharge or polyps  Mouth - mucous membranes moist, pharynx normal without lesions  Neck - supple, no significant adenopathy  Lymphatics - no palpable lymphadenopathy  Chest - no tachypnea, retractions or cyanosis  Heart - S1 and S2 normal  Abdomen - no rebound tenderness noted  Extremities - rt knee area wound vac  present    Labs:     Results    Procedure Component Value Units Date/Time    CULTURE BLOOD AEROBIC AND ANAEROBIC [[id number]] Collected:  03/20/14 0459    Specimen Information:  Blood, Venipuncture Updated:  03/24/14 1021    Narrative:      ORDER#: [id number]                                    ORDERED BY: Tillie Rung  SOURCE: Blood, Venipuncture peripheral blood culture COLLECTED:  03/20/14 04:59  ANTIBIOTICS AT COLL.:                                RECEIVED :  03/20/14 09:52  Culture Blood Aerobic and Anaerobic        PRELIM      03/24/14 10:21  03/21/14  No Growth after 1 day/s of incubation.  03/22/14   No Growth after 2 day/s of incubation.  03/23/14   No Growth after 3 day/s of incubation.  03/24/14   No Growth after 4 day/s of incubation.      CULTURE BLOOD AEROBIC AND ANAEROBIC [[id number]] Collected:  03/20/14 0459    Specimen Information:  Blood, Venipuncture Updated:  03/24/14 1021    Narrative:      ORDER#: [id number]                                    ORDERED BY: Tillie Rung  SOURCE: Blood, Venipuncture peripheral bld cx to docuCOLLECTED:  03/20/14 04:59  ANTIBIOTICS AT COLL.:                                RECEIVED :  03/20/14 09:52  Culture Blood Aerobic and Anaerobic        PRELIM      03/24/14 10:21  03/21/14   No Growth after 1 day/s of incubation.  03/22/14   No Growth after 2 day/s of incubation.  03/23/14   No Growth after 3 day/s of incubation.  03/24/14   No Growth after 4 day/s of incubation.      Basic Metabolic Panel [[id number]]  (Abnormal) Collected:  03/24/14 0705    Specimen Information:  Blood Updated:  03/24/14 0820     Glucose 101 (H) mg/dL      BUN 14 mg/dL      Creatinine 0.7 mg/dL      CALCIUM 8.5 mg/dL      Sodium 254 mEq/L      Potassium 3.9 mEq/L      Chloride 105 mEq/L      CO2 27 mEq/L     GFR [[id number]] Collected:  03/24/14 0705     EGFR >60.0 Updated:  03/24/14 0820    Wound Culture & Gram Stain [[id number]] Collected:  03/22/14 0136    Specimen Information:  Wound /  Wound Updated:  03/24/14 0612    Narrative:      ORDER#: [id number]                                    ORDERED BY: Tillie Rung  SOURCE: Wound right thigh wound                      COLLECTED:  03/22/14 01:36  ANTIBIOTICS AT COLL.:                                RECEIVED :  03/22/14 06:01  Stain, Gram                                FINAL       03/22/14 09:13  03/22/14   Few Squamous epithelial cells             No WBCs or organisms seen  Culture Wound                              PRELIM      03/24/14 06:12  03/23/14   Very light growth of mixed cutaneous flora      Blood culture #1 [[id number]] Collected:  03/18/14 1742    Specimen Information:  Blood, Intravenous Line Updated:  03/23/14 2225    Narrative:      ORDER#: [id number]                                    ORDERED BY: Lovie Macadamia  SOURCE: Blood, Intravenous Line IV                   COLLECTED:  03/18/14 17:42  ANTIBIOTICS AT COLL.:                                RECEIVED :  03/18/14 20:27  Culture Blood Aerobic and Anaerobic        FINAL       03/23/14 22:25  03/23/14   Aerobic Blood Culture No Growth             Anaerobic Blood Culture No Growth      Blood culture #2 [[id number]] Collected:  03/18/14 1742    Specimen Information:  Blood, Intravenous Line Updated:  03/23/14 2219    Narrative:      Gram stain Results called to [id number] by [id number]    .  Readback confirmed, by  [id number] on 03/19/2014 at 07:59  ORDER#: [id number]                                    ORDERED BY: Lovie Macadamia  SOURCE: Blood, Intravenous Line IV                   COLLECTED:  03/18/14 17:42  ANTIBIOTICS AT COLL.:                                RECEIVED :  03/18/14 20:27  Gram stain Results called to [id number] by [id number]    .  Readback confirmed, by [id number] on 03/19/2014 at 07:59  Culture Blood Aerobic and Anaerobic        FINAL       03/23/14 22:19   +  03/19/14   Anaerobic Blood Culture Positive in less than 24 hrs             Gram Stain Shows: Gram positive cocci in chains              Resembling Streptococcus species  03/23/14   Aerobic Blood Culture No Growth  03/20/14   Culture Positive for Streptococcus agalactiae - (Group B)      _____________________________________________________________________________                                 Strep Group B    ANTIBIOTICS                     MIC  INTRP      _____________________________________________________________________________  Amoxicillin                   <=0.25   S        Cefepime                      <=  0.062  S        Cefotaxime                    <=0.062  S        Ceftriaxone                   <=0.062  S        Clindamycin                   <=0.031  S        Erythromycin                     2     R        Levofloxacin                   <=0.5   S        Linezolid                        1     S        Penicillin                    <=0.031  S        Vancomycin                    <=0.25   S        _____________________________________________________________________________            S=SUSCEPTIBLE     I=INTERMEDIATE     R=RESISTANT                            N/S=NON-SUSCEPTIBLE  _____________________________________________________________________________            Recent CBC No results for input(s): RBC, HGB, HCT, MCV, MCH, MCHC, RDW, MPV, LABPLAT in the last 24 hours.    Invalid input(s): WHITEBLOODCE,  NRBCA,  REFLX,  ANRBA  Recent BMP   Recent Labs      03/24/14   0705   Glucose  101*   BUN  14   Creatinine  0.7   CALCIUM  8.5   Sodium  142   Potassium  3.9   Chloride  105   CO2  27       Rads:   Radiological Procedure reviewed.    Assessment:   Bacteremia sepsis of GBS  ESBL positive Chronic Right Leg wound/ Cellulitis   S/p TKA complicated with infection and wound vac  S/p Fever  Chronic Anemia   Hypertension  COPD  Constipation  OA    Plan:   Continue ib Invanz  ID follow up  May placed PICC today  Per Id iv abx for 4 week  Continue wound vac  Wound care rn follow up  Plastic surgery follow up  Monitor BP  K level stable  Pt/ot  consult  Continue current management  Lab in am      Signed by: Ophelia Charter Taji Sather,MD

## 2014-03-24 NOTE — Brief Op Note (Signed)
[facility]  [facility]   Brief Op Note     Physician(s): Kameria Canizares C Daylene Vandenbosch, MD    Pre-operative Diagnosis: ESBL right leg wound    Post-operative Diagnosis: Diagnosis is same as preop diagnosis    Procedure(s) Performed: PICC placement                                                                                                                                                                                                                 Anesthesia:  1% lidocaine local    Complications: None    Estimated Blood Loss:  Minimal    Blood Aministered:  None    Fluid Aministered:  Per Nursing    Tubes and Drains:  35 cm PICC right basilic vein    Specimens: None    Findings: Patent right basilic vein. Single lumen PICC placed. Catheter tip is in SVC and catheter is ready for use.    Patient was transferred from the procedure room to the nursing unit in stable condition.  Procedure note dictated.    Signed by: Hope Pigeon, MD  CVIR Department  [facility] [phone]  [facility] [phone]

## 2014-03-24 NOTE — Progress Notes (Signed)
Received pt in bed awake and alert, has c/o RLE pain, right knee dressing and wound vac at 125 mmHg c/d/i, wv-canister empty, RLE elevated on pillow.VS stable. PRN Norco p.o given. Safety and contact isolation for  ESBL in wound maintain. Continue with the plan of care.

## 2014-03-24 NOTE — Plan of Care (Signed)
Problem: Safety  Goal: Patient will be free from injury during hospitalization  Outcome: Progressing  Intervention: Hourly rounding.  Hourly rounds continues and minimum assist to [facility]       Problem: Pain  Goal: Patient's pain/discomfort is manageable  Outcome: Progressing  Pt medicated prn for pain 4/10 with relief.  Intervention: Offer non-pharmocological pain management interventions  Pt wound vac in place and leg elevated on pillows.      Problem: Tissue integrity  Goal: Damaged tissue is healing and protected  Intervention: Consult/Collaborate with Wound Care Nurse  Pt wound vac in place and dressing intact.  Will continues with dressing change and wound care      Comments:   Will continues with wound care pain mangement and safety measures.

## 2014-03-24 NOTE — OR Nursing (Signed)
5 Fr single lumen PICC placed in the Right basilic vein under fluoroscopy guidance and trimmed to 35cm.  Line is ready for use.  Patient tolerated procedure well.  Dressing is clean, dry, intact, and dated.  [name]

## 2014-03-24 NOTE — Progress Notes (Signed)
PROGRESS NOTE    Date Time: 03/24/2014 6:44 AM  Patient Name: [name], [name]      Assessment:    s/p R resection with chronic wound healing issues   Plan:   Attempted aspiration of right knee 6/11, no fluid was obtained  ID Dr. Dan Humphreys is following.  Wound healing with wound VAC in place.  Plastic Surgery (Dr. Campbell Lerner) also following.  At this point it does not appear there is an active infectious process with abscess in the knee.  Would recommend continuing IV abx and potentially looking for another source of infection.  If no other source is found consider CT of the distal thigh/knee to evaluate further.       Subjective:   Pain controlled, tolerating diet    Medications:     Current Facility-Administered Medications   Medication Dose Route Frequency   . amLODIPine  5 mg Oral Daily   . aspirin EC  325 mg Oral Daily   . cloNIDine  0.1 mg Oral Daily   . enoxaparin  40 mg Subcutaneous Daily   . ertapenem  1,000 mg Intravenous Q24H SCH   . gabapentin  300 mg Oral TID   . hydrochlorothiazide  25 mg Oral Daily   . multivitamin  1 tablet Oral Daily   . nebivolol  5 mg Oral Daily   . potassium chloride  20 mEq Oral BID   . sodium hypochlorite   Apply externally Once   . sodium hypochlorite   Apply externally Daily   . tiotropium  18 mcg Inhalation QAM       Physical Exam:     Filed Vitals:    03/23/14 2231   BP: 143/61   Pulse: 61   Temp: 98.6 F (37 C)   Resp:    SpO2: 98%       Intake and Output Summary (Last 24 hours) at Date Time    Intake/Output Summary (Last 24 hours) at 03/24/14 0644  Last data filed at 03/24/14 0000   Gross per 24 hour   Intake    880 ml   Output    550 ml   Net    330 ml       General appearance - no acute distress  Musculoskeletal -  Wound vac on distal wound and packing in proximal knee wound  FiresTA/EHL/GSC  WWP distally  Calves soft, nontender bilateral    Labs:     Results    Procedure Component Value Units Date/Time    Wound Culture & Gram Stain [[id number]] Collected:  03/22/14 0136     Specimen Information:  Wound / Wound Updated:  03/24/14 0612    Narrative:      ORDER#: [id number]                                    ORDERED BY: Tillie Rung  SOURCE: Wound right thigh wound                      COLLECTED:  03/22/14 01:36  ANTIBIOTICS AT COLL.:                                RECEIVED :  03/22/14 06:01  Stain, Gram  FINAL       03/22/14 09:13  03/22/14   Few Squamous epithelial cells             No WBCs or organisms seen  Culture Wound                              PRELIM      03/24/14 06:12  03/23/14   Very light growth of mixed cutaneous flora      Blood culture #1 [[id number]] Collected:  03/18/14 1742    Specimen Information:  Blood, Intravenous Line Updated:  03/23/14 2225    Narrative:      ORDER#: [id number]                                    ORDERED BY: Lovie Macadamia  SOURCE: Blood, Intravenous Line IV                   COLLECTED:  03/18/14 17:42  ANTIBIOTICS AT COLL.:                                RECEIVED :  03/18/14 20:27  Culture Blood Aerobic and Anaerobic        FINAL       03/23/14 22:25  03/23/14   Aerobic Blood Culture No Growth             Anaerobic Blood Culture No Growth      Blood culture #2 [[id number]] Collected:  03/18/14 1742    Specimen Information:  Blood, Intravenous Line Updated:  03/23/14 2219    Narrative:      Gram stain Results called to [id number] by [id number]    .  Readback confirmed, by  [id number] on 03/19/2014 at 07:59  ORDER#: [id number]                                    ORDERED BY: Lovie Macadamia  SOURCE: Blood, Intravenous Line IV                   COLLECTED:  03/18/14 17:42  ANTIBIOTICS AT COLL.:                                RECEIVED :  03/18/14 20:27  Gram stain Results called to [id number] by [id number]    .  Readback confirmed, by [id number] on 03/19/2014 at 07:59  Culture Blood Aerobic and Anaerobic        FINAL       03/23/14 22:19   +  03/19/14   Anaerobic Blood Culture Positive in less than 24 hrs             Gram Stain Shows: Gram positive  cocci in chains             Resembling Streptococcus species  03/23/14   Aerobic Blood Culture No Growth  03/20/14   Culture Positive for Streptococcus agalactiae - (Group B)      _____________________________________________________________________________                                 Strep  Group B    ANTIBIOTICS                     MIC  INTRP      _____________________________________________________________________________  Amoxicillin                   <=0.25   S        Cefepime                      <=0.062  S        Cefotaxime                    <=0.062  S        Ceftriaxone                   <=0.062  S        Clindamycin                   <=0.031  S        Erythromycin                     2     R        Levofloxacin                   <=0.5   S        Linezolid                        1     S        Penicillin                    <=0.031  S        Vancomycin                    <=0.25   S        _____________________________________________________________________________            S=SUSCEPTIBLE     I=INTERMEDIATE     R=RESISTANT                            N/S=NON-SUSCEPTIBLE  _____________________________________________________________________________      CULTURE BLOOD AEROBIC AND ANAEROBIC [[id number]] Collected:  03/20/14 0459    Specimen Information:  Blood, Venipuncture Updated:  03/23/14 1021    Narrative:      ORDER#: [id number]                                    ORDERED BY: Tillie Rung  SOURCE: Blood, Venipuncture peripheral blood culture COLLECTED:  03/20/14 04:59  ANTIBIOTICS AT COLL.:                                RECEIVED :  03/20/14 09:52  Culture Blood Aerobic and Anaerobic        PRELIM      03/23/14 10:21  03/21/14   No Growth after 1 day/s of incubation.  03/22/14   No Growth after 2 day/s of incubation.  03/23/14   No Growth after 3 day/s of incubation.      CULTURE BLOOD AEROBIC AND ANAEROBIC [[id number]] Collected:  03/20/14 0459  Specimen Information:  Blood, Venipuncture Updated:   03/23/14 1021    Narrative:      ORDER#: [id number]                                    ORDERED BY: Tillie Rung  SOURCE: Blood, Venipuncture peripheral bld cx to docuCOLLECTED:  03/20/14 04:59  ANTIBIOTICS AT COLL.:                                RECEIVED :  03/20/14 09:52  Culture Blood Aerobic and Anaerobic        PRELIM      03/23/14 10:21  03/21/14   No Growth after 1 day/s of incubation.  03/22/14   No Growth after 2 day/s of incubation.  03/23/14   No Growth after 3 day/s of incubation.            Physical Therapy:   Pain Score: 3-mild pain (03/23/14 2332)              Rads:   Radiological Procedure reviewed.    Signed by: Cheryle Horsfall

## 2014-03-24 NOTE — Progress Notes (Signed)
[name] MRN: [id number]  78 y.o.  female    NUTRITION:  Reason for assessment: Length of stay    Assessment   Subjective: I eat about 50% some of the time.     Past Medical History   Diagnosis Date   . Hypertension    . Pneumonia    . Chronic obstructive pulmonary disease    . Headache(784.0)      not since surgery in 1957   . Arthritis    . Difficulty in walking(719.7)    . Low back pain      Wt Readings from Last 30 Encounters:   03/18/14 102.195 kg (225 lb 4.8 oz)   03/18/14 102.195 kg (225 lb 4.8 oz)   08/20/13 102.059 kg (225 lb)   08/20/13 102.059 kg (225 lb)     History     Social History   . Marital Status: Widowed     Spouse Name: N/A     Number of Children: N/A   . Years of Education: N/A     Occupational History   . Not on file.     Social History Main Topics   . Smoking status: Former Smoker -- 1.00 packs/day for 45 years     Quit date: 08/20/1998   . Smokeless tobacco: Never Used   . Alcohol Use: Yes      Comment: occassionally   . Drug Use: No   . Sexual Activity: Not on file     Other Topics Concern   . Not on file     Social History Narrative       Active Hospital Problems    Diagnosis   . Leukocytosis   . Anemia   . Hypokalemia   . Chronic infection of prosthetic knee   . COPD (chronic obstructive pulmonary disease)   . HTN (hypertension)   . Fever     No Known Allergies  GI Symptoms: poor appetite sometimes  Skin: Right leg and thigh wounds ; wound VAC in place.    Current Meds:    amLODIPine 5 mg Daily   aspirin EC 325 mg Daily   cloNIDine 0.1 mg Daily   enoxaparin 40 mg Daily   ertapenem 1,000 mg Q24H SCH   gabapentin 300 mg TID   hydrochlorothiazide 25 mg Daily   multivitamin 1 tablet Daily   nebivolol 5 mg Daily   potassium chloride 20 mEq BID   sodium hypochlorite  Once   sodium hypochlorite  Daily   tiotropium 18 mcg QAM          Recent Labs:    Recent Labs  Lab 03/21/14  0539 03/19/14  0741 03/18/14  1742   WBC 10.69 12.80* 19.95*   Hgb 8.7* 9.4* 10.3*   Hematocrit 27.5* 30.0* 32.6*   MCV  83.8 84.7 84.2   Platelets 153 162 201       Recent Labs  Lab 03/24/14  0705 03/22/14  0441 03/21/14  0539 03/20/14  0459 03/19/14  0741   Sodium 142 144 144 141 143   Potassium 3.9 3.2* 3.2* 3.9 3.0*   Chloride 105 105 106 106 108   CO2 27 28 27 22 26    BUN 14 19 21* 23* 22*   Creatinine 0.7 0.8 0.8 0.9 0.9   Glucose 101* 101* 114* 102* 108*   CALCIUM 8.5 8.4 8.4 8.4 8.3   EGFR >60.0 >60.0 >60.0 >60.0 >60.0       Recent Labs  Lab  03/21/14  0539 03/19/14  0741 03/18/14  1742   Albumin 2.6* 2.9* 3.5       Intake/Output Summary (Last 24 hours) at 03/24/14 1011  Last data filed at 03/24/14 0000   Gross per 24 hour   Intake    600 ml   Output    550 ml   Net     50 ml       Current Diet Order  Diet cardiac    Anthropometrics  Height: 160 cm (5\' 3" )  Weight: 102.195 kg (225 lb 4.8 oz)  Weight Change: 1.67  IBW/kg (Calculated) Female: 56.37 kg  IBW/kg (Calculated) Female: 52.27 kg  BMI (calculated): 40    Estimated Nutrition Needs:  Estimated Energy Needs  Total Energy Estimated Needs: 1600-1900 cal  Method for Estimating Needs: IBW x ( 25-30 ) cal    Estimated Protein Needs  Total Protein Estimated Needs: 77-96 g  Method for Estimating Needs: IBW x ( 1.2-1.5 ) g    Estimated Carbohydrate Needs  Total Carbohydrate Estimated Needs: 200-238 g  Method for Estimating Needs: 50% of caloric needs    Fluid Needs  Total Fluid Estimated Needs: 1600-1920 ) ml  Method for Estimating Needs: IBW x ( 25-30 ) ml    Learning & Discharge Planning Needs: Yes  Religious/Cultural Food Practices: None    Nutrition Diagnosis:   Increased nutrient needs (specify) related to increased demand for protein, vitamin and mineral as evidenced by delayed wound healing.  Overweight/obesity related to food-and nutrition-related knowledge deficit  as evidenced by BMI of 39.9 grade 2 obesity  Food and nutrition-related knowledge deficit related to cardiac as evidenced by pt accepting written nutrition information    Intervention:  Recommend: 500 mg vitamin  C, 220 mg zinc sulphate, 1 pk juven BID  Provide 1 can ensure plus if po intake is <50% of meals  Provide nutrition education  Consider weight management    Goals:  Increased wound healing prior to discharge    M/E:  Monitor: wound healing and labs  Follow 6/225/15    Lurline Hare MS. RD [id number]  03/24/14 @ 1030

## 2014-03-25 ENCOUNTER — Other Ambulatory Visit: Payer: No Typology Code available for payment source

## 2014-03-25 ENCOUNTER — Inpatient Hospital Stay: Payer: No Typology Code available for payment source

## 2014-03-25 ENCOUNTER — Encounter
Admission: EM | Disposition: A | Discharge status: Skilled Nursing Facility | Payer: Self-pay | Source: Home / Self Care | Attending: Internal Medicine

## 2014-03-25 DIAGNOSIS — B999 Unspecified infectious disease: Secondary | ICD-10-CM | POA: Insufficient documentation

## 2014-03-25 DIAGNOSIS — T889XXS Complication of surgical and medical care, unspecified, sequela: Secondary | ICD-10-CM

## 2014-03-25 SURGERY — ECHOCARDIOGRAM, TRANSESOPHAGEAL
Anesthesia: General

## 2014-03-25 MED ORDER — IOHEXOL 350 MG/ML IV SOLN
INTRAVENOUS | Status: AC
Start: 2014-03-25 — End: 2014-03-25
  Administered 2014-03-25: 100 mL
  Filled 2014-03-25: qty 100

## 2014-03-25 NOTE — Plan of Care (Signed)
Problem: Moderate/High Fall Risk Score >/=15  Goal: Patient will remain free of falls  Outcome: Progressing    Problem: Health Promotion  Goal: Knowledge - disease process  Extent of understanding conveyed about a specific disease process.   Outcome: Progressing  Info regarding TEE given to pt/daughter. Cont to answer pts questions regarding tests/care  Goal: Knowledge - health resources  Extent of understanding and conveyed about healthcare resources.   Outcome: Progressing    Problem: Safety  Goal: Patient will be free from injury during hospitalization  Outcome: Progressing  Pt up in chair with assist of PT, encouraged to call/use call light prn    Problem: Pain  Goal: Patient's pain/discomfort is manageable  Outcome: Progressing  Pt states pain is at acceptable level at this time.    Problem: Psychosocial and Spiritual Needs  Goal: Demonstrates ability to cope with hospitalization/illness  Outcome: Progressing  Pt coping, occasionally teary    Problem: Tissue integrity  Goal: Damaged tissue is healing and protected  Outcome: Not Progressing  DSG to thigh changed as ordered, awaiting wound care RN to do the wound vac dsg.

## 2014-03-25 NOTE — Progress Notes (Signed)
Wound Note  Wound vac changed to left knee which continues to heal , good granular tissue, sanguinous drainage, Saw patient with Dr Campbell Lerner, decision made to apply wound vac to superior wound,pink clean base approx 1.0 x 1.0 x 0.5cm black granufoam applied to both wounds and bridged together, continuous, no leaks secure.  Plan  Dakins solution ordered to superior wound discontinued  Will change again friday    Renelda Loma RN [facility] # [phone]

## 2014-03-25 NOTE — Consults (Signed)
Sheila Edward, MD  [facility] cardiology  Tel:  [phone]  Physician Line: [phone]      Reason for Consult: Asked to see patient because of  Need for TEE  Referring Physician: DR Holley Dexter      Assessment and Plan:      Sepsis with bacteremia with ESBL : Recent blood Cx negative   Right LE cellulitis: Possible surgery  chronic right prosthetic joint infection s/p I and D, Abx spacer s/p wound vac    HTN: Stable. Continue current RX   COPD: Stable      D/w benefits/risks of TEE and pt agreeable.TEE scheduled for 1 PM tomorrow    D/w management with the pt and there family    Cardiology Diagnostics     ECG:  I have personally reviewed the tracing from 06/10.  In summary NSr@75 /min. LAE. NS-ST/T changes    Chest -Xray:  I have personally reviewed it  from 06/10. In summary  No active disease is seen in the chest.    Echocardiogram: ( I have personally reviewed the report)03/2014  1. Normal biventricular size and systolic function with an estimated EF  of greater than 75%.  2. Borderline dilated left atrium.  3. No significant valvular disease appreciated.  4. Moderate pulmonary hypertension.  5. No obvious vegetations were appreciated but cannot be completely  excluded    History of the Present Illness:      78 y/o with chronic right prosthetic joint infection s/p I and D, Abx spacer s/p wound vac with new eschar at the edge of the wound. She developed fever, malaise and was admitted with bacteremia and sepsis with  ESBL    ECHO showed preserved LVEF with no gross vegetations  And TEE requested for ongoing concern for infective endocarditis  Denies chest pain, SOB, dizziness, palpitations .    Past  Medical History:     Past Medical History   Diagnosis Date   . Hypertension    . Pneumonia    . Chronic obstructive pulmonary disease    . Headache(784.0)      not since surgery in 1957   . Arthritis    . Difficulty in walking(719.7)    . Low back pain      Past Surgical History   Procedure Laterality Date    . Cervical spine surgery     . Lumbar spine surgery  posterior w/ hardware   . Tonsillectomy     . Brain surgery  R side/ 1957 Optic nerve pressure   . Eye surgery       cataracts   . Colonoscopy     . Tubal ligation  1961   . Arthroplasty, knee, total  08/25/2013     Procedure: ARTHROPLASTY, KNEE, TOTAL;  Surgeon: Lane Hacker, MD;  Location: [facility];  Service: Orthopedics;  Laterality: Right;   . Arthroplasty, knee, revision total  09/05/2013     Procedure: ARTHROPLASTY, KNEE, REVISION TOTAL;  Surgeon: Lane Hacker, MD;  Location: [facility];  Service: Orthopedics;  Laterality: Right;  RT KNEE WASHOUT & POLY EXCHANGE **POA/CH** (STRYKER)    . Arthroplasty, knee, resection  09/15/2013     Procedure: ARTHROPLASTY, KNEE, RESECTION;  Surgeon: Lane Hacker, MD;  Location: [facility];  Service: Orthopedics;  Laterality: Right;  AND PLACEMENT OF ANTIBIOTIC SPACER  Placement of wound vac   . Arthroplasty, knee, revision total  09/22/2013     Procedure: ARTHROPLASTY, KNEE, REVISION TOTAL;  Surgeon: Lane Hacker, MD;  Location: [facility];  Service: Orthopedics;  Laterality: Right;   . Debridement, muscle flap closure  09/22/2013     Procedure: DEBRIDEMENT, MUSCLE FLAP CLOSURE;  Surgeon: Ernie Avena, MD;  Location: [facility];  Service: Plastics;  Laterality: Right;  DEBRIDEMENT & MUSCLE FLAP KNEE RT   . Graft, skin  split thickness, (medical)  09/22/2013     Procedure: GRAFT, SKIN  SPLIT THICKNESS, (MEDICAL);  Surgeon: Ernie Avena, MD;  Location: [facility];  Service: Plastics;  Laterality: N/A;       Social History:     History     Social History   . Marital Status: Widowed     Spouse Name: N/A     Number of Children: N/A   . Years of Education: N/A     Social History Main Topics   . Smoking status: Former Smoker -- 1.00 packs/day for 45 years     Quit date: 08/20/1998   . Smokeless tobacco: Never Used   . Alcohol Use: Yes      Comment: occassionally   . Drug Use: No   .  Sexual Activity: None     Other Topics Concern   . None     Social History Narrative       Family History:     Mother- PPM    Home medications:   Reviewed personally     Prior to Admission medications    Medication Sig Start Date End Date Taking? Authorizing Provider   albuterol (PROVENTIL) (2.5 MG/3ML) 0.083% nebulizer solution Take 3 mLs (2.5 mg total) by nebulization every 4 (four) hours as needed for Wheezing. 10/14/13  Yes Kolycheva, Galina N, DO   amLODIPine (NORVASC) 5 MG tablet Take 5 mg by mouth daily.   Yes [provider]   aspirin EC 325 MG EC tablet Take 1 tablet (325 mg total) by mouth daily. 08/26/13  Yes Goyal, Nitin, MD   cloNIDine (CATAPRES) 0.1 MG tablet Take 1 tablet (0.1 mg total) by mouth daily. 10/14/13  Yes Kolycheva, Galina N, DO   docusate sodium 100 MG Cap Take 100 mg by mouth 2 (two) times daily as needed for Constipation. 10/14/13  Yes Kolycheva, Galina N, DO   gabapentin (NEURONTIN) 300 MG capsule Take 1 capsule (300 mg total) by mouth every 12 (twelve) hours. 10/14/13  Yes Kolycheva, Galina N, DO   hydrochlorothiazide (HYDRODIURIL) 25 MG tablet Take 25 mg by mouth daily.   Yes [provider]   HYDROcodone-acetaminophen (NORCO) 5-325 MG per tablet Take 1 tablet by mouth every 4 (four) hours as needed. 10/14/13  Yes Kolycheva, Galina N, DO   multivitamin (MULTIVITAMIN) Tab Take 1 tablet by mouth daily. 10/14/13  Yes Kolycheva, Galina N, DO   nebivolol (BYSTOLIC) 5 MG tablet Take 5 mg by mouth daily.   Yes [provider]   ondansetron (ZOFRAN-ODT) 4 MG disintegrating tablet Take 1 tablet (4 mg total) by mouth every 8 (eight) hours as needed for Nausea. 08/26/13  Yes Garey Ham, DO   tiotropium (SPIRIVA) 18 MCG inhalation capsule Place 18 mcg into inhaler and inhale daily.   Yes [provider]   traMADol (ULTRAM) 50 MG tablet Take 1 tablet (50 mg total) by mouth every 6 (six) hours. 10/14/13  Yes Kolycheva, Galina N, DO   amLODIPine (NORVASC) 10 MG tablet  Take 1 tablet (10 mg total) by mouth daily. 10/14/13   Kolycheva,  Galina N, DO   lidocaine (LIDODERM) 5 % Place 2 patches onto the skin daily. Remove & Discard patch within 12 hours or as directed by MD 10/14/13   Earl Many, DO   mupirocin (BACTROBAN) 2 % cream Apply topically daily. Apply to affected area daily. 01/20/14   Ernie Avena, MD   mupirocin (BACTROBAN) 2 % ointment Apply topically.    [provider]   polycarbophil (FIBERCON) 625 MG tablet Take 2 tablets (1,250 mg total) by mouth daily. 10/14/13   Earl Many, DO   potassium chloride (K-DUR,KLOR-CON) 10 MEQ tablet Take 1 tablet (10 mEq total) by mouth daily.  Patient taking differently: Take 8 mEq by mouth daily.    10/14/13   Kolycheva, Nilda Calamity, DO   Senna (SENOKOT) 8.6 MG Tab Take 2 tablets (17.2 mg total) by mouth nightly as needed. 10/14/13   Kolycheva, Nilda Calamity, DO   traZODone (DESYREL) 100 MG tablet Take 1 tablet (100 mg total) by mouth nightly as needed for Sleep. 10/14/13   Earl Many, DO       Current Medications:   Reviewed personally      amLODIPine 5 mg Daily   aspirin EC 325 mg Daily   cloNIDine 0.1 mg Daily   enoxaparin 40 mg Daily   ertapenem 1,000 mg Q24H SCH   gabapentin 300 mg TID   hydrochlorothiazide 25 mg Daily   multivitamin 1 tablet Daily   nebivolol 5 mg Daily   potassium chloride 20 mEq BID   sodium chloride (PF) 10 mL Daily   sodium hypochlorite  Daily   tiotropium 18 mcg QAM         Allergies:     No Known Allergies     Review of Symptoms:     ROS + for Right LE pain   All other ROS were reviewed and were  negative except as noted in HPI    Physical Examination:    Vitals reviewed:   BP 141/63   Pulse 62   Temp(Src) 98.6 F (37 C) (Oral)   Resp 16   Ht 1.6 m (5\' 3" )   Wt 102.195 kg (225 lb 4.8 oz)   BMI 39.92 kg/m2     SpO2 97%     Intake/Output Summary (Last 24 hours) at 03/25/14 1601  Last data filed at 03/25/14 0500   Gross per 24 hour   Intake    300 ml   Output      0 ml   Net    300 ml      Constitutional :  no acute distress,    Eyes:  No Pallor or Icterus.  ENMT:    mucous membranes moist.. No Cyanosis  Neck: Mild JVD.   normal thyroid gland  Respiratory:  Good air movement and respiratory effort  bilaterally. No use of accessory muscles. Clear bilaterally. No rales orwheezes.  Cardiovascular:  PMI  Not displaced. Regular . Nl S1 and S2.NO MRG. No carotid bruits.  No abdominal bruits heard . Dorsal pedis 2+b/l.   2+  Edema worse on the right .RLE with warmth, tenderness , joint swelling and would vac in place     Extremities: No Clubbing and cyanosis  Gastrointestinal Soft. Non-tender. Normoactive BS. No abdominal bruits  Skin: Warm. Dry . Right thigh bandaged ( Dx with cellulitis)  Neurologic: Grossly intact.    Musculoskeletal: No Kyphosis or Scoliosi  Psychiatric: AAO X3.  Normal mood and effect.  Laboratory Studies:  (I have personally reviewed the labs below)      CBC w/Diff     Recent Labs  Lab 03/21/14  0539 03/19/14  0741 03/18/14  1742   WBC 10.69 12.80* 19.95*   Hgb 8.7* 9.4* 10.3*   Hematocrit 27.5* 30.0* 32.6*   Platelets 153 162 201          Basic Metabolic Profile     Recent Labs  Lab 03/24/14  0705 03/22/14  0441 03/21/14  0539   Sodium 142 144 144   Potassium 3.9 3.2* 3.2*   Chloride 105 105 106   CO2 27 28 27    BUN 14 19 21*   Creatinine 0.7 0.8 0.8   EGFR >60.0 >60.0 >60.0   Glucose 101* 101* 114*   CALCIUM 8.5 8.4 8.4            Cardiac Enzymes   No results for input(s): CK, TROPI, TROPT, CKMBINDEX in the last 168 hours.       Thyroid Studies    No results for input(s): TSH, FREET3 in the last 168 hours.    Invalid input(s): FREET4        Lipid Profile   No results for input(s): CHOL in the last 168 hours.       Coagulation Studies     Recent Labs  Lab 03/18/14  1742   PT 15.9*   PT INR 1.3*   PTT 29               Thank you for allowing [place] to participate in the care of this patient.  Will follow with you.      [name]  03/25/2014, 4:01 PM

## 2014-03-25 NOTE — OT Eval Note (Signed)
[facility]  [address]  [place], [place] [address]  [phone]    Occupational Therapy Evaluation    Patient: [name] MRN: [id number]   Unit: 4A ACUTE JOINT Bed: A213/Y865-78    Time of treatment: Time Calculation  OT Received On: 03/25/14  Start Time: 0941  Stop Time: 1000  Time Calculation (min): 19 min    Consult received for [name] for OT evaluation and treatment.  Patient's medical condition is appropriate for Occupational Therapy  intervention at this time.    Medical Diagnosis: Fever [780.60]    History of Present Illness: [name] is a 78 y.o. female admitted on  03/18/2014 with   "complicated history of right TKA infection with chronic wound who presents to the hospital with fever and chills. She was sent from Dr Bjorn Pippin office to see ID - Dr Dan Humphreys due to a positive wound cx for ESBL. She only started noticing fever and chills yesterday. Denies any worsening pain of right leg. She was advised by Dr Dan Humphreys to come to ED for admission and treatment with IV abx."      Patient Active Problem List   Diagnosis   . Total knee replacement status   . Low back pain   . Chronic obstructive pulmonary disease   . Hypertension   . Debility following multiple R knee revisions   . Non-healing surgical wound- right knee    . Fever   . Leukocytosis   . Anemia   . Hypokalemia   . Chronic infection of prosthetic knee   . COPD (chronic obstructive pulmonary disease)   . HTN (hypertension)     Past Medical History   Diagnosis Date   . Hypertension    . Pneumonia    . Chronic obstructive pulmonary disease    . Headache(784.0)      not since surgery in 1957   . Arthritis    . Difficulty in walking(719.7)    . Low back pain      Past Surgical History   Procedure Laterality Date   . Cervical spine surgery     . Lumbar spine surgery  posterior w/ hardware   . Tonsillectomy     . Brain surgery  R side/ 1957 Optic nerve pressure   . Eye surgery       cataracts   . Colonoscopy     . Tubal ligation  1961    . Arthroplasty, knee, total  08/25/2013     Procedure: ARTHROPLASTY, KNEE, TOTAL;  Surgeon: Lane Hacker, MD;  Location: [facility];  Service: Orthopedics;  Laterality: Right;   . Arthroplasty, knee, revision total  09/05/2013     Procedure: ARTHROPLASTY, KNEE, REVISION TOTAL;  Surgeon: Lane Hacker, MD;  Location: [facility];  Service: Orthopedics;  Laterality: Right;  RT KNEE WASHOUT & POLY EXCHANGE **POA/CH** (STRYKER)    . Arthroplasty, knee, resection  09/15/2013     Procedure: ARTHROPLASTY, KNEE, RESECTION;  Surgeon: Lane Hacker, MD;  Location: [facility];  Service: Orthopedics;  Laterality: Right;  AND PLACEMENT OF ANTIBIOTIC SPACER  Placement of wound vac   . Arthroplasty, knee, revision total  09/22/2013     Procedure: ARTHROPLASTY, KNEE, REVISION TOTAL;  Surgeon: Lane Hacker, MD;  Location: [facility];  Service: Orthopedics;  Laterality: Right;   . Debridement, muscle flap closure  09/22/2013     Procedure: DEBRIDEMENT, MUSCLE FLAP CLOSURE;  Surgeon: Ernie Avena, MD;  Location: [facility];  Service: Plastics;  Laterality: Right;  DEBRIDEMENT & MUSCLE FLAP KNEE RT   . Graft, skin  split thickness, (medical)  09/22/2013     Procedure: GRAFT, SKIN  SPLIT THICKNESS, (MEDICAL);  Surgeon: Ernie Avena, MD;  Location: [facility];  Service: Plastics;  Laterality: N/A;     Precautions:   Fall  No weight bearing status posted    X-Rays/Tests/Labs:  Results for Sheila, [name] (MRN [id number]) as of 03/25/2014 12:35   Ref. Range 03/18/2014 17:42 03/19/2014 07:41 03/21/2014 05:39   WBC Latest Range: 3.50-10.80 x10 3/uL 19.95 (H) 12.80 (H) 10.69   Hemoglobin Latest Range: 12.0-16.0 g/dL 54.0 (L) 9.4 (L) 8.7 (L)   Hematocrit Latest Range: 37.0-47.0 % 32.6 (L) 30.0 (L) 27.5 (L)     Social History:  Lives with daughter in a house.  Entry Steps: 3   Rails: yes Inside steps: flight  Rails: yes (pt reports she stays on the main level)  Equipment at home: hospital bed, FWW,  Wheelchair, reacher, sock aid, shower chair, BSC over toilet  Prior Level of Function:    Cognition: WFL    Mobility: Mod I   Feeding: ind   Grooming: ind   Bathing: Mod I (sponge bathes)   Dressing: Mod I   Toileting: ind     Subjective: Patient is agreeable to participation in the therapy session. Nursing clears patient for therapy.  Patient's Goal:  Decrease pain, return to PLOF  Pain: 6/10 at rest, 10/10 when touched/with activity   Location: RLE  Therapist Intervention: RN gave pain medication. OT assisted with re-positioning as appropriate.   Patient is satisfied with therapist intervention.    Objective:  Patient is in bed with Intravenous Access and Wound Vac in place.     Observation of patient/vitals:   95% on room air for oxygen  HR in the 60's with activity   Filed Vitals:    03/24/14 1607 03/24/14 1955 03/25/14 0735 03/25/14 0740   BP: 140/58 144/72  142/59   Pulse: 67 69  62   Temp: 97.2 F (36.2 C) 99 F (37.2 C)  97.7 F (36.5 C)   TempSrc:  Oral     Resp: 18 18  16    Height:       Weight:       SpO2: 98% 98% 97% 97%       Orientation/Cognition:     Alert and Oriented x 4  Cognition: WFL    Musculoskeletal Examination:          ROM Strength   RUE WNL 5/5   LUE WNL 5/5     Sensation: Intact to light touch, localization, proprioception throughout BUE  Coordination: WFL BUE  Vision: WFL  Hearing: WFL     Functional Mobility:  Rolling: NT    Scooting: supervision at EOB   Supine to sit: Min A for RLE and HOB raised  Sit to Supine: NT  Sit to stand: Mod A  Stand to sit: Mod A  Transfers: Min A of 2 to ambulate approximately 4 steps bed to [facility] using FWW.     Balance:  Static Sit Balance: good  Dynamic Sit Balance: good  Static Stand Balance: fair+  Dynamic Stand Balance: fair-     Self Care:  Eating: ind  Grooming: supervision; seated in chair  Bathing: Max A without use of AD  UB Dressing: ind  LB Dressing: dependent without use of AD  Toileting: Max A in standing    Endurance: fair-(limited by  pain)    Participation:  good    Education:  Educated the patient to role of occupational therapy, plan of care, goals  of therapy and HEP, safety with mobility and ADLs.    Assessment:  [name] is a 78 y.o. female admitted 03/18/2014. Pt's ability to complete ADLs and functional transfers is impaired due to the following deficits:  Pain, activity tolerance, strength, standing balance.  Pt would continue to benefit from OT to address these deficits and increase independence with ADLs and functional transfers.     Rehabilitation Potential:   good     Patient is in wheelchair next to bed with equipment intact as found, and call bell within reach. RN notified of session outcome.  Mobility and ADL status posted at bedside.    Goals:  Goal Formulation: Patient  Time For Goal Achievement: 5 visits  Goals: Select goal    ADL Goals  Patient will dress lower body: Supervision;with AE  Pt will complete bathing: Supervision (with AE)  Patient will toilet: Supervision    Mobility and Transfer Goals  Pt will transfer bed to [facility]: Supervision    Musculoskeletal Goals  Pt will perform Home Exercise Program: independent;to increase engagement in ADLs    OT Plan  Treatment Interventions: ADL retraining;Functional transfer training;UE strengthening/ROM;Endurance training;Patient/Family training;Equipment eval/education;Compensatory technique education  OT Frequency Recommended: 2-3x/wk    D/C Suggestions:  Discharge Recommendation: SNF  DME Recommended for Discharge:  (defer to next level of rehab)    Signature:   Louie Boston, OT  03/25/2014  12:34 PM  Phone: [phone]

## 2014-03-25 NOTE — Progress Notes (Signed)
Spoke with pt and daughter re [facility] plan  Pt will most likely need SNF placement and facilities of choice have been contacted re bed availability  Will continue to follow  Pt will have TEE today r/o veg

## 2014-03-25 NOTE — Progress Notes (Signed)
PROGRESS NOTE    Date Time: 03/25/2014 1:05 PM  Patient Name: [name], [name]  Length of Stay: 7    Subjective:   I am ok. No cp, no sob..    Medications:     Current Facility-Administered Medications   Medication Dose Route Frequency   . amLODIPine  5 mg Oral Daily   . aspirin EC  325 mg Oral Daily   . cloNIDine  0.1 mg Oral Daily   . enoxaparin  40 mg Subcutaneous Daily   . ertapenem  1,000 mg Intravenous Q24H SCH   . gabapentin  300 mg Oral TID   . hydrochlorothiazide  25 mg Oral Daily   . multivitamin  1 tablet Oral Daily   . nebivolol  5 mg Oral Daily   . potassium chloride  20 mEq Oral BID   . sodium chloride (PF)  10 mL Intravenous Daily   . sodium hypochlorite   Apply externally Once   . sodium hypochlorite   Apply externally Daily   . tiotropium  18 mcg Inhalation QAM         Review of Systems:   A comprehensive review of systems was: General ROS: negative for - chills  ENT ROS: negative for - epistaxis  Respiratory ROS: negative for - hemoptysis  Cardiovascular ROS: negative for - chest pain  Gastrointestinal ROS: negative for - diarrhea  Musculoskeletal ROS: negative for - joint swelling  Neurological ROS: negative for - seizures    Physical Exam:     Filed Vitals:    03/25/14 0740   BP: 142/59   Pulse: 62   Temp: 97.7 F (36.5 C)   Resp: 16   SpO2: 97%       Intake and Output Summary (Last 24 hours) at Date Time    Intake/Output Summary (Last 24 hours) at 03/25/14 1305  Last data filed at 03/25/14 0500   Gross per 24 hour   Intake    300 ml   Output      0 ml   Net    300 ml       Eyes - pupils equal and reactive, extraocular eye movements intact  Ears - bilateral TM's and external ear canals normal  Nose - normal and patent, no erythema, discharge or polyps  Mouth - mucous membranes moist, pharynx normal without lesions  Neck - supple, no significant adenopathy  Lymphatics - no palpable lymphadenopathy  Chest - no tachypnea, retractions or cyanosis  Heart - S1 and S2 normal  Abdomen - no rebound  tenderness noted  Extremities - rt knee area wound vac present    Labs:     Results    Procedure Component Value Units Date/Time    CULTURE BLOOD AEROBIC AND ANAEROBIC [[id number]] Collected:  03/20/14 0459    Specimen Information:  Blood, Venipuncture Updated:  03/25/14 1221    Narrative:      ORDER#: [id number]                                    ORDERED BY: Tillie Rung  SOURCE: Blood, Venipuncture peripheral bld cx to docuCOLLECTED:  03/20/14 04:59  ANTIBIOTICS AT COLL.:                                RECEIVED :  03/20/14 09:52  Culture Blood Aerobic and Anaerobic  FINAL       03/25/14 12:21  03/25/14   No growth after 5 days of incubation.      CULTURE BLOOD AEROBIC AND ANAEROBIC [[id number]] Collected:  03/20/14 0459    Specimen Information:  Blood, Venipuncture Updated:  03/25/14 1221    Narrative:      ORDER#: [id number]                                    ORDERED BY: Tillie Rung  SOURCE: Blood, Venipuncture peripheral blood culture COLLECTED:  03/20/14 04:59  ANTIBIOTICS AT COLL.:                                RECEIVED :  03/20/14 09:52  Culture Blood Aerobic and Anaerobic        FINAL       03/25/14 12:21  03/25/14   No growth after 5 days of incubation.      Wound Culture & Gram Stain [[id number]] Collected:  03/22/14 0136    Specimen Information:  Wound / Wound Updated:  03/25/14 1012    Narrative:      ORDER#: [id number]                                    ORDERED BY: Tillie Rung  SOURCE: Wound right thigh wound                      COLLECTED:  03/22/14 01:36  ANTIBIOTICS AT COLL.:                                RECEIVED :  03/22/14 06:01  Stain, Gram                                FINAL       03/22/14 09:13  03/22/14   Few Squamous epithelial cells             No WBCs or organisms seen  Culture Wound                              FINAL       03/25/14 10:12  03/23/14   Very light growth of mixed cutaneous flora            Recent CBC No results for input(s): RBC, HGB, HCT, MCV, MCH, MCHC, RDW,  MPV, LABPLAT in the last 24 hours.    Invalid input(s): WHITEBLOODCE,  NRBCA,  REFLX,  ANRBA  Recent BMP   No results for input(s): GLU, BUN, CREAT, CA, NA, K, CL, CO2 in the last 24 hours.    Invalid input(s): AGAP    Rads:   Radiological Procedure reviewed.    Assessment:   Bacteremia sepsis of GBS  ESBL positive Chronic Right Leg wound/ Cellulitis   S/p TKA complicated with infection and wound vac  S/p Fever  Chronic Anemia   Hypertension  COPD  Constipation  OA    Plan:   Continue iv Invanz  ID follow up  Per ID May need TEE  Cardiology consult for TEE  S/p  PICC by cvir on 03/24/14  Continue wound vac  Wound care rn follow up  Plastic surgery follow up done today and may do procedure on Monday  Monitor BP  K level stable  Pt/ot follow up  OOB  Continue current management  Lab in am      Signed by: Ophelia Charter Nolton Denis,MD

## 2014-03-25 NOTE — PT Progress Note (Signed)
[name]  [address]  [place], [place] [address]  [phone]    Physical Therapy Evaluation    Patient: [name] MRN: [id number]   Unit: 4A ACUTE JOINT Bed: A213/Y865-78    Time of Treatment: Time Calculation  PT Received On: 03/25/14  Start Time: 1000  Stop Time: 1023  Time Calculation (min): 23 min  Total Treatment Time (min): 23    Consult received for [name] for PT evaluation and treatment.  Patient's medical condition is appropriate for Physical Therapy  intervention at this time.    Medical Diagnosis: Fever [780.60]         History of Present Illness: [name] is a 78 y.o. female admitted on  03/18/2014 with Infection rt knee.  She has an abx spacer and wound vac rt knee.      Patient Active Problem List   Diagnosis   . Total knee replacement status   . Low back pain   . Chronic obstructive pulmonary disease   . Hypertension   . Debility following multiple R knee revisions   . Non-healing surgical wound- right knee    . Fever   . Leukocytosis   . Anemia   . Hypokalemia   . Chronic infection of prosthetic knee   . COPD (chronic obstructive pulmonary disease)   . HTN (hypertension)     Past Medical History   Diagnosis Date   . Hypertension    . Pneumonia    . Chronic obstructive pulmonary disease    . Headache(784.0)      not since surgery in 1957   . Arthritis    . Difficulty in walking(719.7)    . Low back pain      Past Surgical History   Procedure Laterality Date   . Cervical spine surgery     . Lumbar spine surgery  posterior w/ hardware   . Tonsillectomy     . Brain surgery  R side/ 1957 Optic nerve pressure   . Eye surgery       cataracts   . Colonoscopy     . Tubal ligation  1961   . Arthroplasty, knee, total  08/25/2013     Procedure: ARTHROPLASTY, KNEE, TOTAL;  Surgeon: Lane Hacker, MD;  Location: [facility];  Service: Orthopedics;  Laterality: Right;   . Arthroplasty, knee, revision total  09/05/2013     Procedure: ARTHROPLASTY, KNEE, REVISION TOTAL;  Surgeon:  Lane Hacker, MD;  Location: [facility];  Service: Orthopedics;  Laterality: Right;  RT KNEE WASHOUT & POLY EXCHANGE **POA/CH** (STRYKER)    . Arthroplasty, knee, resection  09/15/2013     Procedure: ARTHROPLASTY, KNEE, RESECTION;  Surgeon: Lane Hacker, MD;  Location: [facility];  Service: Orthopedics;  Laterality: Right;  AND PLACEMENT OF ANTIBIOTIC SPACER  Placement of wound vac   . Arthroplasty, knee, revision total  09/22/2013     Procedure: ARTHROPLASTY, KNEE, REVISION TOTAL;  Surgeon: Lane Hacker, MD;  Location: [facility];  Service: Orthopedics;  Laterality: Right;   . Debridement, muscle flap closure  09/22/2013     Procedure: DEBRIDEMENT, MUSCLE FLAP CLOSURE;  Surgeon: Ernie Avena, MD;  Location: [facility];  Service: Plastics;  Laterality: Right;  DEBRIDEMENT & MUSCLE FLAP KNEE RT   . Graft, skin  split thickness, (medical)  09/22/2013     Procedure: GRAFT, SKIN  SPLIT THICKNESS, (MEDICAL);  Surgeon: Ernie Avena, MD;  Location: [facility];  Service: Plastics;  Laterality: N/A;         Precautions: limited rt LE due to antibiotic spacer rt knee and pain rt LE., wound vac rt, MRSA contact prec.    X-Rays/Tests/Labs:      Social History:  Lives with her dtr in a 2 story home. She has a hospital bed on the entry 1st floor level.  1/2 bathroom on entry level.  No shwr.  Entry Steps: 3   Rails: yes Inside steps: 12  Rails: yes  Equipment at home: hospital bed, RW, w/c  Prior Level of Function:    Cognition: A and O x4    Mobility/Locomotion: Uses w/c primarily to get around.  Short distance gt (10') w/RW w/mod ind   Feeding: ind   Grooming: ind   Bathing: mod ind   Dressing: mod ind   Toileting: mod ind    Subjective: Patient is agreeable to participation in the therapy session.  Patient's Goal:  To be able to return home w/her dtr and be able to walk short distances w/RW.  Pain: 8/10  Location: rt knee and lower leg  Therapist Intervention: PT eval.    Patient is  satisfied with therapist intervention.        Objective: Pt received in room resting in bed.    Patient is in bed with  Intravenous (IV) in place.        Observation of patient/vitals:  Filed Vitals:    03/24/14 1955 03/25/14 0735 03/25/14 0740 03/25/14 1438   BP: 144/72  142/59 141/63   Pulse: 69  62 62   Temp: 99 F (37.2 C)  97.7 F (36.5 C) 98.6 F (37 C)   TempSrc: Oral      Resp: 18  16 16    Height:       Weight:       SpO2: 98% 97% 97% 97%       Orientation/Cognition:  Alert and Oriented x 4    Cognition: WNL    Musculoskeletal Examination:      ROM Strength   Neck/ Trunk WNL GOOD   RUE WFL GOOD   LUE WFL GOOD   RLE Hip and ankle WFL knee is limited to full ext due to abx spacer Hip and ankle F, knee NT   LLE WFL GOOD     Sensation: grossly intact to lt touch bil LE's   Coordination: normal    Functional Mobility:  Rolling: mod    Supine to sit: mod  Scooting: mod  Sit to Supine: mod  Sit to stand: mod  Stand to sit: mod  Transfers: stand pvt w/RW w/mod assist TDWB due to pain   W/C Mobility: NT  Ambulation:     Weightbearing: TDWB rt due to pain and abx spacer rt knee    Assistance level: mod    Distance: 4 steps   Assistive Device: RW   Gait Deviations: short sliding steps   Stairs: NT    Balance:  Static Sit: good  Dynamic Sit: fair  Static Stand: fair  Dynamic Stand: fair-    Endurance: Poor     Participation:  Motivated to get back home.    Education:  Educated the patient to role of physical therapy, plan of care, goals  of therapy and safety with mobility and ADLs.    Assessment:  [name] is a 78 y.o. female admitted 03/18/2014.  Pt's functional mobility is impaired due to  the following deficits:  Rt TKA infection w/abx spacer and wound vac rt knee.  Pt would continue to benefit from PT to address these deficits and increase functional independence.     Rehabilitation Potential: Good due to good family support and home set up.        Patient is in wheel chair bedside with LE's elevated, and call  bell within reach. RN notified of session outcome.  Mobility status posted at bedside.    Plan:    Plan  Risks/Benefits/POC Discussed with Pt/Family: With patient  Treatment/Interventions: Exercise;Gait training;Bed mobility  PT Frequency: 3-4x/wk    Goals  Goal Formulation: With patient  Time for Goal Acheivement: 5 visits  Goals: Select goal  Pt Will Go Supine To Sit: with stand by assist  Pt Will Perform Sit To Supine: with minimal assist  Pt Will Transfer Bed/Chair: with contact guard assist  Pt Will Ambulate: 1-10 feet;with contact guard assist    D/C Suggestions:  Recommendation  Discharge Recommendation: Home with supervision;Home with home health PT  PT Frequency: 3-4x/wk      Signature:  Wendie Agreste, PT  03/25/2014  4:55 PM  Phone: [phone]

## 2014-03-25 NOTE — Plan of Care (Signed)
Problem: Health Promotion  Goal: Knowledge - health resources  Extent of understanding and conveyed about healthcare resources.   Intervention: Discharge planning  Pt discharge planning continues.Pt daughter request to sent pt to acute rehab since mother cannot do much for herself,Will inform case manager in am.      Problem: Pain  Goal: Patient's pain/discomfort is manageable  Outcome: Progressing  Medicated with pain meds prn with effects   Intervention: Offer non-pharmocological pain management interventions  Pt assisted to BCS and reposition leg in bed with elevation.

## 2014-03-26 ENCOUNTER — Inpatient Hospital Stay: Payer: No Typology Code available for payment source | Admitting: Pain Medicine

## 2014-03-26 ENCOUNTER — Encounter
Admission: EM | Disposition: A | Discharge status: Skilled Nursing Facility | Payer: Self-pay | Source: Home / Self Care | Attending: Internal Medicine

## 2014-03-26 ENCOUNTER — Inpatient Hospital Stay: Payer: No Typology Code available for payment source

## 2014-03-26 DIAGNOSIS — I10 Essential (primary) hypertension: Secondary | ICD-10-CM

## 2014-03-26 DIAGNOSIS — R7881 Bacteremia: Secondary | ICD-10-CM

## 2014-03-26 HISTORY — PX: ECHOCARDIOGRAM, TRANSESOPHAGEAL: SHX3783

## 2014-03-26 SURGERY — ECHOCARDIOGRAM, TRANSESOPHAGEAL
Anesthesia: Anesthesia General

## 2014-03-26 MED ORDER — HYDROMORPHONE HCL PF 1 MG/ML IJ SOLN
0.5000 mg | INTRAMUSCULAR | Status: DC | PRN
Start: 2014-03-26 — End: 2014-03-26
  Administered 2014-03-26: 0.5 mg via INTRAVENOUS

## 2014-03-26 MED ORDER — HYDROMORPHONE HCL PF 1 MG/ML IJ SOLN
INTRAMUSCULAR | Status: AC
Start: 2014-03-26 — End: 2014-03-26
  Administered 2014-03-26: 0.5 mg via INTRAVENOUS
  Filled 2014-03-26: qty 1

## 2014-03-26 MED ORDER — PROPOFOL 10 MG/ML IV EMUL
INTRAVENOUS | Status: AC
Start: 2014-03-26 — End: ?
  Filled 2014-03-26: qty 20

## 2014-03-26 MED ORDER — FENTANYL CITRATE 0.05 MG/ML IJ SOLN
INTRAMUSCULAR | Status: AC
Start: 2014-03-26 — End: ?
  Filled 2014-03-26: qty 2

## 2014-03-26 MED ORDER — PROPOFOL INFUSION 10 MG/ML
INTRAVENOUS | Status: DC | PRN
Start: 2014-03-26 — End: 2014-03-26
  Administered 2014-03-26: 50 mg via INTRAVENOUS
  Administered 2014-03-26 (×2): 20 mg via INTRAVENOUS

## 2014-03-26 MED ORDER — LACTATED RINGERS IV SOLN
INTRAVENOUS | Status: AC
Start: 2014-03-26 — End: 2014-03-26

## 2014-03-26 MED ORDER — MEPERIDINE HCL 25 MG/ML IJ SOLN
25.0000 mg | INTRAMUSCULAR | Status: DC | PRN
Start: 2014-03-26 — End: 2014-03-26

## 2014-03-26 MED ORDER — LIDOCAINE HCL (PF) 2 % IJ SOLN
INTRAMUSCULAR | Status: AC
Start: 2014-03-26 — End: ?
  Filled 2014-03-26: qty 5

## 2014-03-26 MED ORDER — FENTANYL CITRATE 0.05 MG/ML IJ SOLN
25.0000 ug | INTRAMUSCULAR | Status: DC | PRN
Start: 2014-03-26 — End: 2014-03-26

## 2014-03-26 MED ORDER — METOCLOPRAMIDE HCL 5 MG/ML IJ SOLN
10.0000 mg | Freq: Once | INTRAMUSCULAR | Status: DC | PRN
Start: 2014-03-26 — End: 2014-03-26

## 2014-03-26 MED ORDER — LIDOCAINE HCL 2 % IJ SOLN
INTRAMUSCULAR | Status: DC | PRN
Start: 2014-03-26 — End: 2014-03-26
  Administered 2014-03-26: 60 mg via INTRAVENOUS

## 2014-03-26 MED ORDER — LACTATED RINGERS IV SOLN
INTRAVENOUS | Status: DC | PRN
Start: 2014-03-26 — End: 2014-03-26

## 2014-03-26 MED ORDER — NEOSTIGMINE METHYLSULFATE 1 MG/ML IJ SOLN
INTRAMUSCULAR | Status: AC
Start: 2014-03-26 — End: ?
  Filled 2014-03-26: qty 10

## 2014-03-26 MED ORDER — ONDANSETRON HCL 4 MG/2ML IJ SOLN
4.0000 mg | Freq: Once | INTRAMUSCULAR | Status: DC | PRN
Start: 2014-03-26 — End: 2014-03-26

## 2014-03-26 MED ORDER — HYDROCODONE-ACETAMINOPHEN 5-325 MG PO TABS
1.0000 | ORAL_TABLET | Freq: Once | ORAL | Status: DC | PRN
Start: 2014-03-26 — End: 2014-03-26

## 2014-03-26 MED ORDER — PROPOFOL 10 MG/ML IV EMUL
INTRAVENOUS | Status: AC
Start: 2014-03-26 — End: ?
  Filled 2014-03-26: qty 40

## 2014-03-26 NOTE — Anesthesia Postprocedure Evaluation (Signed)
Anesthesia Post Evaluation    Patient: [name]    Procedures performed: Procedure(s):  ECHOCARDIOGRAM, TRANSESOPHAGEAL    Anesthesia type: MAC    Patient location:Phase I PACU    Last vitals:   Filed Vitals:    03/26/14 1723   BP: 125/51   Pulse: 61   Temp:    Resp: 18   SpO2:        Post pain: Patient not complaining of pain, continue current therapy      Mental Status:awake    Respiratory Function: tolerating room air    Cardiovascular: stable    Nausea/Vomiting: patient not complaining of nausea or vomiting    Hydration Status: adequate    Post assessment: no apparent anesthetic complications

## 2014-03-26 NOTE — Plan of Care (Signed)
Problem: Safety  Goal: Patient will be free from injury during hospitalization  Outcome: Progressing  Patient assisted out of bed to bedside commode. Call light within reach. Safety precautions maintained.    Problem: Pain  Goal: Patient's pain/discomfort is manageable  Outcome: Not Progressing  Reported right leg pain. Tramadol PO administered with good effect reported by patient.    Problem: Tissue integrity  Goal: Damaged tissue is healing and protected  Outcome: Progressing  Wound vac intact on right lower extremity. Gauze dressing intact on right inner thigh blister site. Assisted patient with repositioning for comfort.

## 2014-03-26 NOTE — Transfer of Care (Signed)
Anesthesia Transfer of Care Note    Patient: [name]    Procedures performed: Procedure(s):  ECHOCARDIOGRAM, TRANSESOPHAGEAL    Anesthesia type: MAC    Patient location:Phase I PACU    Last vitals:   Filed Vitals:    03/26/14 1517   BP: 124/60   Pulse: 59   Temp: 36.2 C (97.2 F)   Resp: 12   SpO2: 100%       Post pain: Patient not complaining of pain, continue current therapy      Mental Status:sedated    Respiratory Function: tolerating nasal cannula    Cardiovascular: stable    Nausea/Vomiting: patient not complaining of nausea or vomiting    Hydration Status: adequate    Post assessment: no apparent anesthetic complications, no reportable events and no evidence of recall

## 2014-03-26 NOTE — PT Progress Note (Signed)
Physical Therapy Note    PT attempted, however, pt unable to participate 2* to pt going for a TEE procedure soon. Pt wants to rest now.  Pt evaluated on 03/25/14.  Lorenz Coaster, PT  03/26/2014  12:06 PM

## 2014-03-26 NOTE — Progress Notes (Signed)
PROGRESS NOTE    Date Time: 03/26/2014 11:39 AM  Patient Name: [name]  Length of Stay: 8    Subjective:   I feel ok. No sob..    Medications:     Current Facility-Administered Medications   Medication Dose Route Frequency   . amLODIPine  5 mg Oral Daily   . aspirin EC  325 mg Oral Daily   . cloNIDine  0.1 mg Oral Daily   . enoxaparin  40 mg Subcutaneous Daily   . ertapenem  1,000 mg Intravenous Q24H SCH   . gabapentin  300 mg Oral TID   . hydrochlorothiazide  25 mg Oral Daily   . multivitamin  1 tablet Oral Daily   . nebivolol  5 mg Oral Daily   . potassium chloride  20 mEq Oral BID   . sodium chloride (PF)  10 mL Intravenous Daily   . sodium hypochlorite   Apply externally Daily   . tiotropium  18 mcg Inhalation QAM         Review of Systems:   A comprehensive review of systems was: General ROS: negative for - chills  ENT ROS: negative for - epistaxis  Respiratory ROS: negative for - hemoptysis  Cardiovascular ROS: negative for - chest pain  Gastrointestinal ROS: negative for - diarrhea  Musculoskeletal ROS: negative for - joint swelling  Neurological ROS: negative for - seizures    Physical Exam:     Filed Vitals:    03/26/14 0939   BP: 125/69   Pulse: 60   Temp:    Resp:    SpO2:        Intake and Output Summary (Last 24 hours) at Date Time    Intake/Output Summary (Last 24 hours) at 03/26/14 1139  Last data filed at 03/25/14 2359   Gross per 24 hour   Intake    400 ml   Output      0 ml   Net    400 ml       Eyes - pupils equal and reactive, extraocular eye movements intact  Ears - bilateral TM's and external ear canals normal  Nose - normal and patent, no erythema, discharge or polyps  Mouth - mucous membranes moist, pharynx normal without lesions  Neck - supple, no significant adenopathy  Lymphatics - no palpable lymphadenopathy  Chest - no tachypnea, retractions or cyanosis  Heart - S1 and S2 normal  Abdomen - no rebound tenderness noted  Extremities - rt knee area wound vac present    Labs:     Results     Procedure Component Value Units Date/Time    CULTURE BLOOD AEROBIC AND ANAEROBIC [[id number]] Collected:  03/20/14 0459    Specimen Information:  Blood, Venipuncture Updated:  03/25/14 1221    Narrative:      ORDER#: [id number]                                    ORDERED BY: Tillie Rung  SOURCE: Blood, Venipuncture peripheral bld cx to docuCOLLECTED:  03/20/14 04:59  ANTIBIOTICS AT COLL.:                                RECEIVED :  03/20/14 09:52  Culture Blood Aerobic and Anaerobic        FINAL       03/25/14 12:21  03/25/14   No growth after 5 days of incubation.      CULTURE BLOOD AEROBIC AND ANAEROBIC [[id number]] Collected:  03/20/14 0459    Specimen Information:  Blood, Venipuncture Updated:  03/25/14 1221    Narrative:      ORDER#: [id number]                                    ORDERED BY: Tillie Rung  SOURCE: Blood, Venipuncture peripheral blood culture COLLECTED:  03/20/14 04:59  ANTIBIOTICS AT COLL.:                                RECEIVED :  03/20/14 09:52  Culture Blood Aerobic and Anaerobic        FINAL       03/25/14 12:21  03/25/14   No growth after 5 days of incubation.            Recent CBC No results for input(s): RBC, HGB, HCT, MCV, MCH, MCHC, RDW, MPV, LABPLAT in the last 24 hours.    Invalid input(s): WHITEBLOODCE,  NRBCA,  REFLX,  ANRBA  Recent BMP   No results for input(s): GLU, BUN, CREAT, CA, NA, K, CL, CO2 in the last 24 hours.    Invalid input(s): AGAP    Rads:   Radiological Procedure reviewed.    Assessment:   Bacteremia sepsis of GBS  ESBL positive Chronic Right Leg wound/ Cellulitis   S/p TKA complicated with infection and wound vac  S/p Fever  Chronic Anemia   Hypertension  COPD  Constipation  OA    Plan:   Continue iv Invanz  ID follow up  Cardiology plan to do TEE today  NPO  Genta hydration  Continue wound vac  Wound care rn follow up  Per Plastic surgery may do procedure on Monday  Monitor BP  No significant wheezing  Pt/ot follow up  OOB  Continue current management  Lab in  am      Signed by: Sheila Charter Chivon Lepage,MD

## 2014-03-26 NOTE — Progress Notes (Signed)
Pt transported to OR (pr-op) for TEE via bed, iv saline locked

## 2014-03-26 NOTE — Plan of Care (Signed)
Problem: Safety  Goal: Patient will be free from injury during hospitalization  Outcome: Progressing  Pt has call light in reach, hourly rounding, assist up to [facility] supporting right leg    Problem: Pain  Goal: Patient's pain/discomfort is manageable  Outcome: Progressing  Medicate prn  Every  6 hours with Norco, given at [phone] with relief    Problem: Tissue integrity  Goal: Damaged tissue is healing and protected  Outcome: Progressing   wound vac in place, at 125 continuous, right knee elevated on pillow, keep straight when up and supported. Blister on right inner thigh intact covered to protect

## 2014-03-26 NOTE — Anesthesia Preprocedure Evaluation (Signed)
Anesthesia Evaluation    AIRWAY    Mallampati: II    TM distance: >3 FB  Neck ROM: full  Mouth Opening:full   CARDIOVASCULAR    cardiovascular exam normal       DENTAL    no notable dental hx     PULMONARY    pulmonary exam normal     OTHER FINDINGS              PSS Anesthesia Comments: Asa 3 ( COPD)        Anesthesia Plan    ASA 3     general                                 informed consent obtained      pertinent labs reviewed

## 2014-03-26 NOTE — Progress Notes (Signed)
CARDIOLOGY HOSPITAL VISIT PROGRESS NOTE  Date:  03/26/2014, 3:13 PM  Patient:  [name].  DOB:  [birth year].      ASSESSMENT and PLAN:  --- sepsis.  TEE today shows no vegetation. Full report to follow  --- cellulitis, chronic R prosthetic joint infection  --- HTN    CC:  bacteremia  HPI/ROS:    --- No CP or SOB    STUDIES:  --- 03/20/14  1. Normal biventricular size and systolic function with an estimated EF  of greater than 75%.  2. Borderline dilated left atrium.  3. No significant valvular disease appreciated.  4. Moderate pulmonary hypertension.  5. No obvious vegetations were appreciated but cannot be completely  excluded.    PHYSICAL EXAM:   BP 149/64 mmHg  Pulse 65  Temp(Src) 97.6 F (36.4 C) (Temporal Artery)  Resp 16  Ht 1.6 m (5\' 3" )  Wt 102.195 kg (225 lb 4.8 oz)  BMI 39.92 kg/m2  SpO2 98%  >> Constitutional:   well appearing,  >> Pulmonary:   unlabored respiration,  no crackles,  >> Cardiovascular:   regular rhythm,  normal S1S2,  II/VI systolic ejection murmur,  >> Neuro:   alert,  oriented,  Psychiatric:   appropriate mood and affect,   .  SELECTED LABS:     Recent Labs  Lab 03/21/14  0539   WBC 10.69   HEMOGLOBIN 8.7*   HEMATOCRIT 27.5*   PLATELETS 153        Recent Labs  Lab 03/24/14  0705   SODIUM 142   POTASSIUM 3.9   CHLORIDE 105   CO2 27   BUN 14   CREATININE 0.7     No results for input(s): INR in the last 168 hours.  No results for input(s): CK, TROPI, TROPT, CKMBINDEX in the last 168 hours.    INPATIENT MEDS AT BEGINNING OF VISIT:  INFUSION MEDS:  . lactated ringers 50 mL/hr at 03/26/14 1135     SCHEDULED MEDS:  Current Facility-Administered Medications   Medication Dose Route Frequency   . [MAR Hold] amLODIPine  5 mg Oral Daily   . [MAR Hold] aspirin EC  325 mg Oral Daily   . [MAR Hold] cloNIDine  0.1 mg Oral Daily   . [MAR Hold] enoxaparin  40 mg Subcutaneous Daily   . [MAR Hold] ertapenem  1,000 mg Intravenous Q24H SCH   . [MAR Hold] gabapentin  300 mg Oral TID   . [MAR Hold]  hydrochlorothiazide  25 mg Oral Daily   . [MAR Hold] multivitamin  1 tablet Oral Daily   . [MAR Hold] nebivolol  5 mg Oral Daily   . [MAR Hold] potassium chloride  20 mEq Oral BID   . [MAR Hold] sodium chloride (PF)  10 mL Intravenous Daily   . [MAR Hold] sodium hypochlorite   Apply externally Daily   . [MAR Hold] tiotropium  18 mcg Inhalation QAM     PRN MEDS:  [MAR Hold] acetaminophen, [MAR Hold] HYDROcodone-acetaminophen, [MAR Hold] HYDROmorphone, [MAR Hold] ondansetron, [MAR Hold] traMADol    Babygirl Trager Alyson Locket, MD [facility]  [facility]  [facility] [place] [facility] [place]  Tel [phone]

## 2014-03-27 ENCOUNTER — Encounter: Payer: Self-pay | Admitting: Cardiovascular Disease

## 2014-03-27 LAB — CBC WITH MANUAL DIFFERENTIAL
Band Neutrophils Absolute: 0.53 10*3/uL (ref 0.00–1.00)
Band Neutrophils: 6 %
Basophils Absolute Manual: 0 10*3/uL (ref 0.00–0.20)
Basophils Manual: 0 %
Cell Morphology: ABNORMAL — AB
Eosinophils Absolute Manual: 0 10*3/uL (ref 0.00–0.70)
Eosinophils Manual: 0 %
Hematocrit: 26.2 % — ABNORMAL LOW (ref 37.0–47.0)
Hgb: 8.1 g/dL — ABNORMAL LOW (ref 12.0–16.0)
Lymphocytes Absolute Manual: 0.88 10*3/uL (ref 0.50–4.40)
Lymphocytes Manual: 10 %
MCH: 25.9 pg — ABNORMAL LOW (ref 28.0–32.0)
MCHC: 30.9 g/dL — ABNORMAL LOW (ref 32.0–36.0)
MCV: 83.7 fL (ref 80.0–100.0)
MPV: 10.2 fL (ref 9.4–12.3)
Monocytes Absolute: 0.09 10*3/uL (ref 0.00–1.20)
Monocytes Manual: 1 %
Neutrophils Absolute Manual: 7.31 10*3/uL (ref 1.80–8.10)
Nucleated RBC: 0 /100 WBC (ref 0–1)
Platelets: 278 10*3/uL (ref 140–400)
RBC: 3.13 10*6/uL — ABNORMAL LOW (ref 4.20–5.40)
RDW: 14 % (ref 12–15)
Segmented Neutrophils: 83 %
WBC: 8.81 10*3/uL (ref 3.50–10.80)

## 2014-03-27 LAB — COMPREHENSIVE METABOLIC PANEL
ALT: 13 U/L (ref 0–55)
AST (SGOT): 18 U/L (ref 5–34)
Albumin/Globulin Ratio: 0.6 — ABNORMAL LOW (ref 0.9–2.2)
Albumin: 2.4 g/dL — ABNORMAL LOW (ref 3.5–5.0)
Alkaline Phosphatase: 73 U/L (ref 37–106)
BUN: 11 mg/dL (ref 7–19)
Bilirubin, Total: 0.4 mg/dL (ref 0.2–1.2)
CO2: 29 mEq/L (ref 22–29)
Calcium: 8.5 mg/dL (ref 7.9–10.2)
Chloride: 107 mEq/L (ref 100–111)
Creatinine: 0.7 mg/dL (ref 0.6–1.0)
Globulin: 4.1 g/dL — ABNORMAL HIGH (ref 2.0–3.6)
Glucose: 98 mg/dL (ref 70–100)
Potassium: 4 mEq/L (ref 3.5–5.1)
Protein, Total: 6.5 g/dL (ref 6.0–8.3)
Sodium: 143 mEq/L (ref 136–145)

## 2014-03-27 LAB — CBC AND DIFFERENTIAL
Basophils Absolute Automated: 0.02 10*3/uL (ref 0.00–0.20)
Basophils Automated: 0 %
Eosinophils Absolute Automated: 0.19 10*3/uL (ref 0.00–0.70)
Eosinophils Automated: 2 %
Hematocrit: 26.1 % — ABNORMAL LOW (ref 37.0–47.0)
Hgb: 8.1 g/dL — ABNORMAL LOW (ref 12.0–16.0)
Immature Granulocytes Absolute: 0.02 10*3/uL
Immature Granulocytes: 0 %
Lymphocytes Absolute Automated: 1.58 10*3/uL (ref 0.50–4.40)
Lymphocytes Automated: 17 %
MCH: 25.9 pg — ABNORMAL LOW (ref 28.0–32.0)
MCHC: 31 g/dL — ABNORMAL LOW (ref 32.0–36.0)
MCV: 83.4 fL (ref 80.0–100.0)
MPV: 10.7 fL (ref 9.4–12.3)
Monocytes Absolute Automated: 0.74 10*3/uL (ref 0.00–1.20)
Monocytes: 8 %
Neutrophils Absolute: 6.62 10*3/uL (ref 1.80–8.10)
Neutrophils: 72 %
Nucleated RBC: 0 /100 WBC (ref 0–1)
Platelets: 282 10*3/uL (ref 140–400)
RBC: 3.13 10*6/uL — ABNORMAL LOW (ref 4.20–5.40)
RDW: 14 % (ref 12–15)
WBC: 9.15 10*3/uL (ref 3.50–10.80)

## 2014-03-27 LAB — C-REACTIVE PROTEIN: C-Reactive Protein: 11.6 mg/dL — ABNORMAL HIGH (ref 0.0–0.5)

## 2014-03-27 LAB — SEDIMENTATION RATE: Sed Rate: 112 mm/Hr — ABNORMAL HIGH (ref 0–20)

## 2014-03-27 LAB — GFR: EGFR: >60

## 2014-03-27 NOTE — Progress Notes (Signed)
INFECTIOUS DISEASES PROGRESS NOTE    Date Time: 03/27/2014 12:32 PM  Patient Name: [name]      Assessment and Recommendations:     Patient Active Problem List   Diagnosis   . Total knee replacement status   . Low back pain   . Chronic obstructive pulmonary disease   . Hypertension   . Debility following multiple R knee revisions   . Non-healing surgical wound- right knee    . Fever   . Leukocytosis   . Anemia   . Hypokalemia   . Chronic infection of prosthetic knee   . COPD (chronic obstructive pulmonary disease)   . HTN (hypertension)   . 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 CX at that time + for  Proteus - tx'd with ciprofloxacin.  This wound  Continues to heal with ongoing vac ap, and is   Very superficial, clean, and s sign of infection.    Recently pt developed an open wound at apex of  Healed right TKR vertical surgical scar. (developed   Under a chronic eschar)   Recent wound  cx grew group c strep and esbl e coli.   Pt admitted with fever, sepsis, leukocytosis  -    Admission blood cxs - 1/2 from 6/10 +  Group b strep (?gu source- urine cx done post  Abx).  F/u bld cxs negative.      Tee  6/19 s valvular vegetation - nl lv fxn    Ct findings noted. - no definite abscess/  ?phlegmonous changs.    All considered, think it reasonable to complete 4 wks  Of iv Invanz -   4 wks of tx will be completed on 04/14/14          Subjective:   In good spirits     Antibiotics:   Invanz      Day 10 abx.      picc 6/16    Medications:     Current Facility-Administered Medications   Medication Dose Route Frequency   . amLODIPine  5 mg Oral Daily   . aspirin EC  325 mg Oral Daily   . cloNIDine  0.1 mg Oral Daily   . enoxaparin  40 mg Subcutaneous Daily   . ertapenem  1,000 mg Intravenous Q24H SCH   . gabapentin  300 mg Oral TID   . hydrochlorothiazide  25 mg Oral Daily   . multivitamin  1 tablet Oral Daily   . nebivolol  5  mg Oral Daily   . potassium chloride  20 mEq Oral BID   . sodium chloride (PF)  10 mL Intravenous Daily   . sodium hypochlorite   Apply externally Daily   . tiotropium  18 mcg Inhalation QAM       Central Access/Endovascular Hardware:     Active PICC Line / CVC Line / PIV Line / Drain / Airway / Intraosseous Line / Epidural Line / ART Line / Line / Wound / Pressure Ulcer / NG/OG Tube    Name:   Placement date:   Placement time:   Site:   Days:    PICC Single Lumen 03/24/14 Right Basilic  03/24/14   1518   Basilic   2    OnQ Pump (Incisional) 08/25/13 knee Right  08/25/13   0939   knee   214  Peripheral IV 09/24/13 Left Hand  09/24/13   0730   Hand   184    Closed/Suction Drain Right Knee 10 Fr.  09/05/13   1401   Knee   202    Negative Pressure Wound Therapy Knee Right        Knee       Epidural Catheter              Incision Site 09/05/13 Knee Right  09/05/13   1437     202    Incision Site 09/15/13 Knee Right  09/15/13   1517     192    Incision Site 09/22/13 Leg Right  09/22/13   1901     185          Review of Systems:   Patient is without fevers, chills, nausea, vomiting, diarrhea, abdominal pain, cough, dyspnea, headache, rash.      Physical Exam:     Filed Vitals:    03/26/14 1723 03/26/14 1837 03/26/14 2000 03/27/14 0725   BP: 125/51 145/67 129/55 143/50   Pulse: 61 68 69 62   Temp:  98.6 F (37 C) 98.2 F (36.8 C) 98.2 F (36.8 C)   TempSrc:       Resp: 18 18 18 18    Height:       Weight:       SpO2:  97% 98% 94%       Temp (24hrs), Avg:97.8 F (36.6 C), Min:97.2 F (36.2 C), Max:98.6 F (37 C)        In no apparent distress  Sclera anicteric, conjunctivae clear  Oral pharynx s erythema, exudate, or other significant lesions  No Thrush  Neck supple, s lymphadenopathy  Lungs Clear  Heart Regular rate and rhythm s murmurs, rubs, or gallops  Abdomen Soft, non tender, non distended, normal bowel sounds  Back No Costovertebral angle tenderness, no point tenderness along the spine  Extremities No  Cyanosis, no clubbing,   +2 edema right leg   Right knee superior wound 1.6 cm deep by approx 1.2 cm   Scant amt fib exudate, no drainage, no erythema.  No surrounding induration or fluctuance   Tissue mixed fat/gran tissue.    Right inferior knee wound - clean, beefy read - 2 - 3 mm deep  At the most, no surrounding erythema.- [name] shaped -  Roughly 4 x 3 cm  Beautiful pink granulation tissue  Palpable pedal pulses  Neurologic exam is grossly non-focal  Skin without rash     Labs:     Results    Procedure Component Value Units Date/Time    Sedimentation rate (ESR) [[id number]]  (Abnormal) Collected:  03/27/14 0526    Specimen Information:  Blood Updated:  03/27/14 0841     Sed Rate 112 (H) mm/Hr     CBC WITH MANUAL DIFFERENTIAL [[id number]]  (Abnormal) Collected:  03/27/14 0527     WBC 8.81 x10 3/uL Updated:  03/27/14 0649     RBC 3.13 ([name]) x10 6/uL      Hgb 8.1 ([name]) g/dL      Hematocrit 78.2 ([name]) %      MCV 83.7 fL      MCH 25.9 ([name]) pg      MCHC 30.9 ([name]) g/dL      RDW 14 %      Platelets 278 x10 3/uL      MPV 10.2 fL      Segmented Neutrophils 83 %  Band Neutrophils 6 %      Lymphocytes Manual 10 %      Monocytes Manual 1 %      Eosinophils Manual 0 %      Basophils Manual 0 %      Nucleated RBC 0 /100 WBC      Abs Seg Manual 7.31 x10 3/uL      Bands Absolute 0.53 x10 3/uL      Absolute Lymph Manual 0.88 x10 3/uL      Monocytes Absolute 0.09 x10 3/uL      Absolute Eos Manual 0.00 x10 3/uL      Absolute Baso Manual 0.00 x10 3/uL      Cell Morphology: Abnormal (A)      Hypochromia =1+ (A)     C Reactive Protein [[id number]]  (Abnormal) Collected:  03/27/14 0526    Specimen Information:  Blood Updated:  03/27/14 0613     C-Reactive Protein 11.6 (H) mg/dL     GFR [[id number]] Collected:  03/27/14 0526     EGFR >60.0 Updated:  03/27/14 0613    Comprehensive metabolic panel [[id number]]  (Abnormal) Collected:  03/27/14 0526    Specimen Information:  Blood Updated:  03/27/14 0613     Glucose 98 mg/dL      BUN 11 mg/dL       Creatinine 0.7 mg/dL      CALCIUM 8.5 mg/dL      Sodium 086 mEq/[name]      Potassium 4.0 mEq/[name]      Chloride 107 mEq/[name]      CO2 29 mEq/[name]      Protein, Total 6.5 g/dL      Albumin 2.4 ([name]) g/dL      AST (SGOT) 18 U/[name]      ALT 13 U/[name]      Alkaline Phosphatase 73 U/[name]      Bilirubin, Total 0.4 mg/dL      Globulin 4.1 (H) g/dL      Albumin/Globulin Ratio 0.6 ([name])     CBC and differential [[id number]]  (Abnormal) Collected:  03/27/14 0526    Specimen Information:  Blood / Blood Updated:  03/27/14 0548     WBC 9.15 x10 3/uL      RBC 3.13 ([name]) x10 6/uL      Hgb 8.1 ([name]) g/dL      Hematocrit 52.8 ([name]) %      MCV 83.4 fL      MCH 25.9 ([name]) pg      MCHC 31.0 ([name]) g/dL      RDW 14 %      Platelets 282 x10 3/uL      MPV 10.7 fL      Neutrophils 72 %      Lymphocytes Automated 17 %      Monocytes 8 %      Eosinophils Automated 2 %      Basophils Automated 0 %      Immature Granulocyte 0 %      Nucleated RBC 0 /100 WBC      Neutrophils Absolute 6.62 x10 3/uL      Abs Lymph Automated 1.58 x10 3/uL      Abs Mono Automated 0.74 x10 3/uL      Abs Eos Automated 0.19 x10 3/uL      Absolute Baso Automated 0.02 x10 3/uL      Absolute Immature Granulocyte 0.02 x10 3/uL           Recent Labs  Lab 03/27/14  0527   WBC 8.81   HEMOGLOBIN 8.1*   HEMATOCRIT 26.2*   PLATELETS 278        Recent Labs  Lab 03/27/14  0526   SODIUM 143   POTASSIUM 4.0   CHLORIDE 107   CO2 29   BUN 11   CREATININE 0.7   CALCIUM 8.5   ALBUMIN 2.4*   PROTEIN, TOTAL 6.5   BILIRUBIN, TOTAL 0.4   ALKALINE PHOSPHATASE 73   ALT 13   AST (SGOT) 18   GLUCOSE 98     No results for input(s): INR, APTT in the last 168 hours.       Rads:     Radiology Results (24 Hour)    Procedure Component Value Units Date/Time    CT Lower Extremity Right W Contrast [[id number]] Collected:  03/26/14 0836    Order Status:  Completed Updated:  03/26/14 1701    Narrative:      INDICATION: Right lower leg swelling and pain. Evaluate for soft tissue  necrosis.    TECHNIQUE: Axial CT of the right lower extremity  was performed from the  level of the mid femoral diaphysis to the distal tibial diaphysis  following administration of 100 mL Omnipaque 350.    FINDINGS: Patient is status post removal of a right total knee  arthroplasty with placement of an antibiotic impregnated cement spacer  and transfixing intramedullary rod. There is fragmentation of the distal  femur posterior to the rod with a well-corticated ossific fragment that  measures 4.1 x 2.5 x 2.1 cm noted. There is a distally threaded inferior  approach orthopedic screw at the medial femoral condyle transfixing a  minimally displaced fracture which demonstrates sclerosis consistent  with healing response. There is lucency measuring up to 5 mm along the  distal aspect of the intramedullary rod within the distal tibial  diaphysis (series 3, image 119). There is 2 to 4 mm lucency along the  antibiotic spacer. Lucency along the proximal aspect of the  intramedullary rod within the femoral diaphysis measures up to 5.5 mm.    There is diffuse subcutaneous stranding and skin thickening, most  prominently at the level of the tibial metaphysis and proximal diaphysis  medially and posteriorly. There is linear scarring demonstrated along  the anterior soft tissues at midline about the knee joint, and linear  scar seen posteriorly below the level of the knee joint. There is an  area of fluid/stranding demonstrated within the posterior subcutaneous  tissues at the level of the proximal tibial metadiaphysis measures 6.0 x  3.4 x 2.8 cm and medially at that level there is an area of stranding  and soft tissue density inseparable from the musculature measuring 3.0 x  2.1 cm. Overlying skin is irregular and thinned. There are no definite  peripherally enhancing fluid collection seen. No gas is demonstrated.  Several well-corticated ossific densities are demonstrated of the tendon  about the joint space. There is fatty streaking and infiltration within  the musculature.  Peripheral  vascular disease. There are surgical clips demonstrated  within the medial subcutaneous tissues at the level of the mid tibial  diaphysis measuring approximately 14 mm each.      Impression:        1. Status post removal of total knee arthroplasty with placement of an  antibiotic impregnated cement spacer and transfixing intramedullary rod.  Lucencies about the rod as discussed above.  Status post medial femoral  condyle fracture fixation.    2. Subcutaneous  edema/fluid and soft tissue densities with skin  irregularity, most notably at the level of the proximal tibial  metadiaphysis medially and posteriorly as discussed above suspicious for  cellulitis and/or phlegmonous changes. No definite peripherally  enhancing fluid collections are seen.     Gustavus Messing, MD   03/26/2014 4:57 PM            Signed by: Ray Church, MD

## 2014-03-27 NOTE — PT Progress Note (Signed)
Physical Therapy Note    [facility]  [address]  [place] [address]  [phone]    Physical Therapy Treatment    Patient:  [name]        MRN#:  [id number]  Unit:  4A ACUTE JOINT        Room/Bed:  I696/E952-84    Medical Diagnosis: Fever [780.60]    Time of treatment:  PT Received On: 03/27/14  Start Time: 1300 Stop Time: 1345  Time Calculation (min): 45 min    Treatment #: PT Visit Number: 1/5    Precautions  Weight Bearing Status: no restrictions  Other Precautions: falls    Patient's medical condition is appropriate for Physical Therapy intervention at this time.    Subjective: "I am in 10/10 pain."  Patient is agreeable to participation in the therapy session.  Patient Goal:  (To get better)  Pain Assessment  Pain Assessment: Numeric Scale (0-10)  Pain Score: 10-severe pain  Pain Location: Knee  Pain Orientation: Right  Pain Descriptors: Constant;Cramping  Pain Frequency: Continuous  Effect of Pain on Daily Activities: moderate  Patient's Stated Comfort Functional Goal: 1-mild pain  Pain Intervention(s): Medication (See eMAR);Repositioned          Objective:  Observation of Patient:  Patient is in bed with Intravenous (IV) and Wound Vac in place.         Cognition  Arousal/Alertness: Appropriate responses to stimuli  Attention Span: Appears intact  Orientation Level: Oriented X4  Memory: Appears intact  Following Commands: Follows one step commands without difficulty  Safety Awareness: independent  Insights: Fully aware of deficits  Problem Solving: Able to problem solve independently    Functional Mobility  Supine to Sit: Contact Guard Assist  Scooting to HOB: Contact Guard Assist  Scooting to EOB: Contact Guard Assist  Sit to Stand: Minimal Assist  Stand to Sit: Minimal Assist    Transfers  Bed to Chair: Minimal Assist  Chair to Bed: Minimal Assist  Stand Pivot Transfers: Minimal Assist    Balance  Balance: within functional limits    Locomotion  Ambulation: Unable to assess  (Comment)                Vitals:     Filed Vitals:    03/26/14 1837 03/26/14 2000 03/27/14 0725 03/27/14 1354   BP: 145/67 129/55 143/50 128/56   Pulse: 68 69 62 64   Temp: 98.6 F (37 C) 98.2 F (36.8 C) 98.2 F (36.8 C) 98.2 F (36.8 C)   TempSrc:       Resp: 18 18 18 18    Height:       Weight:       SpO2: 97% 98% 94% 96%            Educated the patient to role of physical therapy, plan of care, goals  of therapy and safety with mobility and ADLs.    Patient left in w/c with all needs met, equipment intact and call bell within reach. RN notified of session outcome.    Assessment:  Assessment  Assessment: Decreased LE ROM;Decreased LE strength;Decreased endurance/activity tolerance;Decreased functional mobility;Decreased balance;Gait impairment  Prognosis: Good;With continued PT status post acute discharge  Progress: Slow progress, decreased activity tolerance    Progress: Slow progress, decreased activity tolerance    Risks/Benefits/POC Discussed with Pt/Family: With patient    Goals per Eval/ Re-eval:   Goals  Goal Formulation: With patient  Time for Goal Acheivement: 5  visits  Goals: Select goal  Pt Will Go Supine To Sit: with stand by assist  Pt Will Perform Sit To Supine: with minimal assist  Pt Will Transfer Bed/Chair: with contact guard assist  Pt Will Ambulate: 1-10 feet, with contact guard assist    Recommendation  Discharge Recommendation: Home with supervision, Home with home health PT  DME Recommended for Discharge:  (None)  PT - Next Visit Recommendation: 03/30/14  PT Frequency: 3-4x/wk      Plan:    Plan  Risks/Benefits/POC Discussed with Pt/Family: With patient  Patient Goal:  (To get better)  Treatment/Interventions: Exercise;Gait training;Bed mobility  PT Frequency: 3-4x/wk    Continue plan of care.    Signature:  Lorenz Coaster, PT  03/27/2014 2:05 PM   Phone: [phone]

## 2014-03-27 NOTE — Plan of Care (Signed)
Problem: Safety  Goal: Patient will be free from injury during hospitalization  Outcome: Not Progressing  Intervention: Assess patient's risk for falls and implement fall prevention plan of care per policy  Patient is high fall risk and encouraged to push call light when out of bed.   Intervention: Use appropriate transfer methods   assisted while transfer when out of bed.       Problem: Pain  Goal: Patient's pain/discomfort is manageable  Outcome: Progressing  Patient is reporting pain level of 3/10 and is comfortable at this time, will monitor pain level and pain medication will be given.   Intervention: Offer non-pharmocological pain management interventions  Will be encouraged to position self      Problem: Tissue integrity  Goal: Damaged tissue is healing and protected  Outcome: Progressing  Intervention: Reposition patient every 2 hours and PRN unless able to self reposition.  Self regulated and does not need repositioning, require  Assistance to get out of bed.   Intervention: Keep intact skin clean and dry.  Will be given CHg wipe and bed linen will be changed.   Intervention: Monitor external devices/tubes for correct placement to prevent pressure, friction and shearing  Wound vac dressing of the rt knee monitored the proper functioning, no leak and working well.  Dressing is intact.       Comments:   On iv anti-biotic for infected rt knee andfmedication is given as ordered.   Wound vac dressing is intact and no leakage is noted.

## 2014-03-27 NOTE — Addendum Note (Signed)
Addendum created 03/27/14 0835 by Jaxan Michel Jane, CRNA    Modules edited: Anesthesia Events, Narrator    Narrator:  Narrator: Event Log Edited

## 2014-03-27 NOTE — Plan of Care (Signed)
Problem: Pain  Goal: Patient's pain/discomfort is manageable  Outcome: Progressing  Maintain patient pain level to 4 out of 10 as acceptable pain  Level for patient   Encouraged to call RN for pain medication and report pain level of 5/10 and if pain is increasing or unrelieve  Educate patient possible side effects of pt pain medication

## 2014-03-27 NOTE — Anesthesia Postprocedure Evaluation (Signed)
Anesthesia Post Evaluation    Patient: [name]    Procedures performed: Procedure(s):  ECHOCARDIOGRAM, TRANSESOPHAGEAL    Anesthesia type: General TIVA    Patient location:[facility]    Last vitals:   Filed Vitals:    03/27/14 1354   BP: 128/56   Pulse: 64   Temp: 36.8 C (98.2 F)   Resp: 18   SpO2: 96%       Post pain: Patient not complaining of pain, continue current therapy      Mental Status:awake and alert     Respiratory Function: tolerating room air    Cardiovascular: stable    Nausea/Vomiting: patient not complaining of nausea or vomiting    Hydration Status: adequate    Post assessment: no apparent anesthetic complications, no reportable events and no evidence of recall

## 2014-03-27 NOTE — Progress Notes (Signed)
PROGRESS NOTE    Date Time: 03/27/2014 10:57 AM  Patient Name: [name]  Length of Stay: 9    Subjective:   I am ok. No chill. But some pain in my leg.    Medications:     Current Facility-Administered Medications   Medication Dose Route Frequency   . amLODIPine  5 mg Oral Daily   . aspirin EC  325 mg Oral Daily   . cloNIDine  0.1 mg Oral Daily   . enoxaparin  40 mg Subcutaneous Daily   . ertapenem  1,000 mg Intravenous Q24H SCH   . gabapentin  300 mg Oral TID   . hydrochlorothiazide  25 mg Oral Daily   . multivitamin  1 tablet Oral Daily   . nebivolol  5 mg Oral Daily   . potassium chloride  20 mEq Oral BID   . sodium chloride (PF)  10 mL Intravenous Daily   . sodium hypochlorite   Apply externally Daily   . tiotropium  18 mcg Inhalation QAM         Review of Systems:   A comprehensive review of systems was: General ROS: negative for - chills  ENT ROS: negative for - epistaxis  Respiratory ROS: negative for - hemoptysis  Cardiovascular ROS: negative for - chest pain  Gastrointestinal ROS: negative for - diarrhea  Musculoskeletal ROS: negative for - joint swelling  Neurological ROS: negative for - seizures    Physical Exam:     Filed Vitals:    03/27/14 0725   BP: 143/50   Pulse: 62   Temp: 98.2 F (36.8 C)   Resp: 18   SpO2: 94%       Intake and Output Summary (Last 24 hours) at Date Time    Intake/Output Summary (Last 24 hours) at 03/27/14 1057  Last data filed at 03/26/14 1725   Gross per 24 hour   Intake    450 ml   Output      0 ml   Net    450 ml       Eyes - pupils equal and reactive, extraocular eye movements intact  Ears - bilateral TM's and external ear canals normal  Nose - normal and patent, no erythema, discharge or polyps  Mouth - mucous membranes moist, pharynx normal without lesions  Neck - supple, no significant adenopathy  Lymphatics - no palpable lymphadenopathy  Chest - no tachypnea, retractions or cyanosis  Heart - S1 and S2 normal  Abdomen - no rebound tenderness noted  Extremities - rt  knee area wound vac present    Labs:     Results    Procedure Component Value Units Date/Time    Sedimentation rate (ESR) [[id number]]  (Abnormal) Collected:  03/27/14 0526    Specimen Information:  Blood Updated:  03/27/14 0841     Sed Rate 112 (H) mm/Hr     CBC WITH MANUAL DIFFERENTIAL [[id number]]  (Abnormal) Collected:  03/27/14 0527     WBC 8.81 x10 3/uL Updated:  03/27/14 0649     RBC 3.13 ([name]) x10 6/uL      Hgb 8.1 ([name]) g/dL      Hematocrit 95.6 ([name]) %      MCV 83.7 fL      MCH 25.9 ([name]) pg      MCHC 30.9 ([name]) g/dL      RDW 14 %      Platelets 278 x10 3/uL      MPV 10.2 fL  Segmented Neutrophils 83 %      Band Neutrophils 6 %      Lymphocytes Manual 10 %      Monocytes Manual 1 %      Eosinophils Manual 0 %      Basophils Manual 0 %      Nucleated RBC 0 /100 WBC      Abs Seg Manual 7.31 x10 3/uL      Bands Absolute 0.53 x10 3/uL      Absolute Lymph Manual 0.88 x10 3/uL      Monocytes Absolute 0.09 x10 3/uL      Absolute Eos Manual 0.00 x10 3/uL      Absolute Baso Manual 0.00 x10 3/uL      Cell Morphology: Abnormal (A)      Hypochromia =1+ (A)     C Reactive Protein [[id number]]  (Abnormal) Collected:  03/27/14 0526    Specimen Information:  Blood Updated:  03/27/14 0613     C-Reactive Protein 11.6 (H) mg/dL     GFR [[id number]] Collected:  03/27/14 0526     EGFR >60.0 Updated:  03/27/14 0613    Comprehensive metabolic panel [[id number]]  (Abnormal) Collected:  03/27/14 0526    Specimen Information:  Blood Updated:  03/27/14 0613     Glucose 98 mg/dL      BUN 11 mg/dL      Creatinine 0.7 mg/dL      CALCIUM 8.5 mg/dL      Sodium 086 mEq/[name]      Potassium 4.0 mEq/[name]      Chloride 107 mEq/[name]      CO2 29 mEq/[name]      Protein, Total 6.5 g/dL      Albumin 2.4 ([name]) g/dL      AST (SGOT) 18 U/[name]      ALT 13 U/[name]      Alkaline Phosphatase 73 U/[name]      Bilirubin, Total 0.4 mg/dL      Globulin 4.1 (H) g/dL      Albumin/Globulin Ratio 0.6 ([name])     CBC and differential [[id number]]  (Abnormal) Collected:  03/27/14 0526    Specimen  Information:  Blood / Blood Updated:  03/27/14 0548     WBC 9.15 x10 3/uL      RBC 3.13 ([name]) x10 6/uL      Hgb 8.1 ([name]) g/dL      Hematocrit 52.8 ([name]) %      MCV 83.4 fL      MCH 25.9 ([name]) pg      MCHC 31.0 ([name]) g/dL      RDW 14 %      Platelets 282 x10 3/uL      MPV 10.7 fL      Neutrophils 72 %      Lymphocytes Automated 17 %      Monocytes 8 %      Eosinophils Automated 2 %      Basophils Automated 0 %      Immature Granulocyte 0 %      Nucleated RBC 0 /100 WBC      Neutrophils Absolute 6.62 x10 3/uL      Abs Lymph Automated 1.58 x10 3/uL      Abs Mono Automated 0.74 x10 3/uL      Abs Eos Automated 0.19 x10 3/uL      Absolute Baso Automated 0.02 x10 3/uL      Absolute Immature Granulocyte 0.02 x10 3/uL  Recent CBC   Recent Labs      03/27/14   0527   RBC  3.13*   HEMOGLOBIN  8.1*   HEMATOCRIT  26.2*   MCV  83.7   MCH, POC  25.9*   MCHC  30.9*   RDW  14   MPV  10.2     Recent BMP   Recent Labs      03/27/14   0526   GLUCOSE  98   BUN  11   CREATININE  0.7   CALCIUM  8.5   SODIUM  143   POTASSIUM  4.0   CHLORIDE  107   CO2  29       Rads:   Radiological Procedure reviewed.    Assessment:   Bacteremia sepsis of GBS  ESBL positive Chronic Right Leg wound/ Cellulitis   S/p TKA complicated with infection and wound vac  S/p Fever  ? Peripheral neuropathy  Chronic Anemia   Hypertension  COPD  Constipation  OA    Plan:   Continue iv Invanz  ID follow up  Cardiology did TEE on 03/26/14 and no vegetation per cardiology  Follow complete report  Continue wound vac  Wound care rn follow up  Per Plastic surgery may do procedure on Monday  Monitor BP  No significant wheezing  Pt/ot follow up  OOB  Continue current management  Lab in am      Signed by: Sheila Charter Antione Obar,MD

## 2014-03-27 NOTE — Progress Notes (Signed)
Wound Note  Wound vac changed to left knee which continues to heal , good granular tissue, sanguinous drainage, pink clean base  black granufoam applied to both wounds and bridged together, continuous, no leaks secure.    Plan   Will change again Monday    Renelda Loma RN CWOCN  [phone]

## 2014-03-28 LAB — CBC AND DIFFERENTIAL
Basophils Absolute Automated: 0.02 10*3/uL (ref 0.00–0.20)
Basophils Automated: 0 %
Eosinophils Absolute Automated: 0.21 10*3/uL (ref 0.00–0.70)
Eosinophils Automated: 2 %
Hematocrit: 25.7 % — ABNORMAL LOW (ref 37.0–47.0)
Hgb: 7.9 g/dL — ABNORMAL LOW (ref 12.0–16.0)
Immature Granulocytes Absolute: 0.02 10*3/uL
Immature Granulocytes: 0 %
Lymphocytes Absolute Automated: 1.57 10*3/uL (ref 0.50–4.40)
Lymphocytes Automated: 17 %
MCH: 25.6 pg — ABNORMAL LOW (ref 28.0–32.0)
MCHC: 30.7 g/dL — ABNORMAL LOW (ref 32.0–36.0)
MCV: 83.2 fL (ref 80.0–100.0)
MPV: 10.5 fL (ref 9.4–12.3)
Monocytes Absolute Automated: 0.68 10*3/uL (ref 0.00–1.20)
Monocytes: 8 %
Neutrophils Absolute: 6.63 10*3/uL (ref 1.80–8.10)
Neutrophils: 73 %
Nucleated RBC: 0 /100 WBC (ref 0–1)
Platelets: 293 10*3/uL (ref 140–400)
RBC: 3.09 10*6/uL — ABNORMAL LOW (ref 4.20–5.40)
RDW: 14 % (ref 12–15)
WBC: 9.11 10*3/uL (ref 3.50–10.80)

## 2014-03-28 LAB — BASIC METABOLIC PANEL
BUN: 10 mg/dL (ref 7–19)
CO2: 29 mEq/L (ref 22–29)
Calcium: 8.4 mg/dL (ref 7.9–10.2)
Chloride: 107 mEq/L (ref 100–111)
Creatinine: 0.7 mg/dL (ref 0.6–1.0)
Glucose: 91 mg/dL (ref 70–100)
Potassium: 4.1 mEq/L (ref 3.5–5.1)
Sodium: 144 mEq/L (ref 136–145)

## 2014-03-28 LAB — GFR: EGFR: >60

## 2014-03-28 MED ORDER — DIAZEPAM 2 MG PO TABS
2.0000 mg | ORAL_TABLET | Freq: Three times a day (TID) | ORAL | Status: DC | PRN
Start: 2014-03-28 — End: 2014-03-31
  Administered 2014-03-29 – 2014-03-30 (×2): 2 mg via ORAL
  Filled 2014-03-28 (×5): qty 1

## 2014-03-28 NOTE — Plan of Care (Signed)
Problem: Safety  Goal: Patient will be free from injury during hospitalization  Outcome: Progressing  safety measures enforced and maintained siderails  up x2 call bell within reach rounding by staff     Problem: Pain  Goal: Patient's pain/discomfort is manageable  Outcome: Progressing  Patient medicated for c/o of right knee pain with reported relief rates pain level tolerable at 3/10 will continue to monitor  and maintain pain control     Problem: Tissue integrity  Goal: Damaged tissue is healing and protected  Outcome: Progressing  Patient  positioned for comfort wound vac at and scd's on and in place with no reported problems

## 2014-03-28 NOTE — Progress Notes (Signed)
PROGRESS NOTE    Date Time: 03/28/2014 9:39 AM  Patient Name: [name], [name]  Length of Stay: 10    Subjective:   Still with pain in right leg, shooting pains and muscle spasms    Medications:     Current Facility-Administered Medications   Medication Dose Route Frequency   . amLODIPine  5 mg Oral Daily   . aspirin EC  325 mg Oral Daily   . cloNIDine  0.1 mg Oral Daily   . enoxaparin  40 mg Subcutaneous Daily   . ertapenem  1,000 mg Intravenous Q24H SCH   . gabapentin  300 mg Oral TID   . hydrochlorothiazide  25 mg Oral Daily   . multivitamin  1 tablet Oral Daily   . nebivolol  5 mg Oral Daily   . potassium chloride  20 mEq Oral BID   . sodium chloride (PF)  10 mL Intravenous Daily   . sodium hypochlorite   Apply externally Daily   . tiotropium  18 mcg Inhalation QAM         Review of Systems:   A comprehensive review of systems was: General ROS: negative for - chills  ENT ROS: negative for - epistaxis  Respiratory ROS: negative for - hemoptysis  Cardiovascular ROS: negative for - chest pain  Gastrointestinal ROS: negative for - diarrhea  Musculoskeletal ROS: negative for - joint swelling  Neurological ROS: negative for - seizures    Physical Exam:     Filed Vitals:    03/27/14 2248   BP: 140/62   Pulse: 72   Temp:    Resp:    SpO2:        Intake and Output Summary (Last 24 hours) at Date Time  No intake or output data in the 24 hours ending 03/28/14 0939    Eyes - pupils equal and reactive, extraocular eye movements intact  Ears - bilateral TM's and external ear canals normal  Nose - normal and patent, no erythema, discharge or polyps  Mouth - mucous membranes moist, pharynx normal without lesions  Neck - supple, no significant adenopathy  Lymphatics - no palpable lymphadenopathy  Chest - no tachypnea, retractions or cyanosis  Heart - S1 and S2 normal  Abdomen - no rebound tenderness noted  Extremities - rt knee area wound vac present    Labs:     Results    Procedure Component Value Units Date/Time    Basic  Metabolic Panel [[id number]] Collected:  03/28/14 0706    Specimen Information:  Blood Updated:  03/28/14 0732     Glucose 91 mg/dL      BUN 10 mg/dL      Creatinine 0.7 mg/dL      CALCIUM 8.4 mg/dL      Sodium 093 mEq/L      Potassium 4.1 mEq/L      Chloride 107 mEq/L      CO2 29 mEq/L     GFR [[id number]] Collected:  03/28/14 0706     EGFR >60.0 Updated:  03/28/14 0732    CBC and differential [[id number]]  (Abnormal) Collected:  03/28/14 0706    Specimen Information:  Blood / Blood Updated:  03/28/14 0717     WBC 9.11 x10 3/uL      RBC 3.09 (L) x10 6/uL      Hgb 7.9 (L) g/dL      Hematocrit 76.2 (L) %      MCV 83.2 fL      MCH 25.6 (L)  pg      MCHC 30.7 (L) g/dL      RDW 14 %      Platelets 293 x10 3/uL      MPV 10.5 fL      Neutrophils 73 %      Lymphocytes Automated 17 %      Monocytes 8 %      Eosinophils Automated 2 %      Basophils Automated 0 %      Immature Granulocyte 0 %      Nucleated RBC 0 /100 WBC      Neutrophils Absolute 6.63 x10 3/uL      Abs Lymph Automated 1.57 x10 3/uL      Abs Mono Automated 0.68 x10 3/uL      Abs Eos Automated 0.21 x10 3/uL      Absolute Baso Automated 0.02 x10 3/uL      Absolute Immature Granulocyte 0.02 x10 3/uL           Recent CBC   Recent Labs      03/28/14   0706   RBC  3.09*   HEMOGLOBIN  7.9*   HEMATOCRIT  25.7*   MCV  83.2   MCH, POC  25.6*   MCHC  30.7*   RDW  14   MPV  10.5     Recent BMP   Recent Labs      03/28/14   0706   GLUCOSE  91   BUN  10   CREATININE  0.7   CALCIUM  8.4   SODIUM  144   POTASSIUM  4.1   CHLORIDE  107   CO2  29       Rads:   Radiological Procedure reviewed.    Assessment:   Bacteremia sepsis of GBS  ESBL positive Chronic Right Leg wound/ Cellulitis   S/p TKA complicated with infection and wound vac  S/p Fever  ? Peripheral neuropathy  Chronic Anemia - baseline 9-10  Hypertension  COPD  Constipation  OA      Plan:   Continue iv Invanz  ID follow up  Cardiology did TEE on 03/26/14 and no vegetation per cardiology  Follow complete report  Continue  wound vac  Wound care rn follow up  Per Plastic surgery may do procedure on Monday  Monitor BP  No significant wheezing  Pt/ot follow up  OOB  Continue current management  Lab in am  Adjust pain mgmt      Signed by: Sheila Balsam Latiana Tomei,MD

## 2014-03-28 NOTE — Plan of Care (Signed)
Problem: Safety  Goal: Patient will be free from injury during hospitalization  Outcome: Progressing  Patient mobility limited WC:HENI swelling  wound vac was oob to bsc with minimal assist using walker     Problem: Pain  Goal: Patient's pain/discomfort is manageable  Outcome: Progressing  Patient in bed with siderails up x2 call bell within reach reports pain is managed with ordered pain medication will continue to monitor and maintain pain control

## 2014-03-29 LAB — CBC AND DIFFERENTIAL
Basophils Absolute Automated: 0.02 10*3/uL (ref 0.00–0.20)
Basophils Automated: 0 %
Eosinophils Absolute Automated: 0.18 10*3/uL (ref 0.00–0.70)
Eosinophils Automated: 2 %
Hematocrit: 27 % — ABNORMAL LOW (ref 37.0–47.0)
Hgb: 8.4 g/dL — ABNORMAL LOW (ref 12.0–16.0)
Immature Granulocytes Absolute: 0.02 10*3/uL
Immature Granulocytes: 0 %
Lymphocytes Absolute Automated: 1.95 10*3/uL (ref 0.50–4.40)
Lymphocytes Automated: 19 %
MCH: 26.1 pg — ABNORMAL LOW (ref 28.0–32.0)
MCHC: 31.1 g/dL — ABNORMAL LOW (ref 32.0–36.0)
MCV: 83.9 fL (ref 80.0–100.0)
MPV: 10.1 fL (ref 9.4–12.3)
Monocytes Absolute Automated: 0.66 10*3/uL (ref 0.00–1.20)
Monocytes: 6 %
Neutrophils Absolute: 7.42 10*3/uL (ref 1.80–8.10)
Neutrophils: 72 %
Nucleated RBC: 0 /100 WBC (ref 0–1)
Platelets: 322 10*3/uL (ref 140–400)
RBC: 3.22 10*6/uL — ABNORMAL LOW (ref 4.20–5.40)
RDW: 14 % (ref 12–15)
WBC: 10.23 10*3/uL (ref 3.50–10.80)

## 2014-03-29 LAB — BASIC METABOLIC PANEL
BUN: 9 mg/dL (ref 7–19)
CO2: 27 mEq/L (ref 22–29)
Calcium: 8.6 mg/dL (ref 7.9–10.2)
Chloride: 105 mEq/L (ref 100–111)
Creatinine: 0.7 mg/dL (ref 0.6–1.0)
Glucose: 93 mg/dL (ref 70–100)
Potassium: 4.4 mEq/L (ref 3.5–5.1)
Sodium: 142 mEq/L (ref 136–145)

## 2014-03-29 LAB — GFR: EGFR: >60

## 2014-03-29 MED ORDER — HYDROXYZINE HCL 10 MG PO TABS
10.0000 mg | ORAL_TABLET | Freq: Four times a day (QID) | ORAL | Status: DC | PRN
Start: 2014-03-29 — End: 2014-03-31

## 2014-03-29 MED ORDER — TRAMADOL HCL 50 MG PO TABS
50.0000 mg | ORAL_TABLET | Freq: Four times a day (QID) | ORAL | Status: DC
Start: 2014-03-29 — End: 2014-03-31
  Administered 2014-03-29 – 2014-03-31 (×6): 50 mg via ORAL
  Filled 2014-03-29 (×6): qty 1

## 2014-03-29 NOTE — Plan of Care (Signed)
Problem: Safety  Goal: Patient will be free from injury during hospitalization  Outcome: Progressing  Patient and family members participating in safety plan.  Assisted patient out of bed to commode with walker with minimal assist.  Personal belongings and call light within reach; hourly rounding  to assist with needs.    Problem: Pain  Goal: Patient's pain/discomfort is manageable  Outcome: Progressing  Encouraged patient to ask for pain medication before pain becomes   unbearable. Tramadol one tablet administered for pain thus far.    Problem: Tissue integrity  Goal: Damaged tissue is healing and protected  Outcome: Progressing  Wound vac to right leg in place at ordered settings; black granufoam dressing intact;  no drainage noted; tubing secured. Black granufoam dressing to right thigh in place.   Patient reports, feeling of chafing in perineal region; no redness or skin breakdown noted.  Barrier cream applied after voiding and bowel movements. Legs elevated on pillows and   repositioned with pllow support.

## 2014-03-29 NOTE — Progress Notes (Signed)
PROGRESS NOTE    Date Time: 03/29/2014 10:54 AM  Patient Name: [name], [name]  Length of Stay: 11    Subjective:   Still with pain in right leg.  Trying to figure out pain med mgmt.    Medications:     Current Facility-Administered Medications   Medication Dose Route Frequency   . amLODIPine  5 mg Oral Daily   . aspirin EC  325 mg Oral Daily   . cloNIDine  0.1 mg Oral Daily   . enoxaparin  40 mg Subcutaneous Daily   . ertapenem  1,000 mg Intravenous Q24H SCH   . gabapentin  300 mg Oral TID   . hydrochlorothiazide  25 mg Oral Daily   . multivitamin  1 tablet Oral Daily   . nebivolol  5 mg Oral Daily   . potassium chloride  20 mEq Oral BID   . sodium chloride (PF)  10 mL Intravenous Daily   . sodium hypochlorite   Apply externally Daily   . tiotropium  18 mcg Inhalation QAM   . traMADol  50 mg Oral Q6H         Review of Systems:   A comprehensive review of systems was: General ROS: negative for - chills  ENT ROS: negative for - epistaxis  Respiratory ROS: negative for - hemoptysis  Cardiovascular ROS: negative for - chest pain  Gastrointestinal ROS: negative for - diarrhea  Musculoskeletal ROS: negative for - joint swelling  Neurological ROS: negative for - seizures    Physical Exam:     Filed Vitals:    03/29/14 0744   BP: 142/58   Pulse: 64   Temp: 97.3 F (36.3 C)   Resp: 17   SpO2: 94%       Intake and Output Summary (Last 24 hours) at Date Time    Intake/Output Summary (Last 24 hours) at 03/29/14 1054  Last data filed at 03/29/14 0554   Gross per 24 hour   Intake     50 ml   Output    780 ml   Net   -730 ml       Eyes - pupils equal and reactive, extraocular eye movements intact  Ears - bilateral TM's and external ear canals normal  Nose - normal and patent, no erythema, discharge or polyps  Mouth - mucous membranes moist, pharynx normal without lesions  Neck - supple, no significant adenopathy  Lymphatics - no palpable lymphadenopathy  Chest - no tachypnea, retractions or cyanosis  Heart - S1 and S2  normal  Abdomen - no rebound tenderness noted  Extremities - rt knee area wound vac present    Labs:     Results    Procedure Component Value Units Date/Time    Basic Metabolic Panel [[id number]] Collected:  03/29/14 0521    Specimen Information:  Blood Updated:  03/29/14 0605     Glucose 93 mg/dL      BUN 9 mg/dL      Creatinine 0.7 mg/dL      CALCIUM 8.6 mg/dL      Sodium 086 mEq/L      Potassium 4.4 mEq/L      Chloride 105 mEq/L      CO2 27 mEq/L     GFR [[id number]] Collected:  03/29/14 0521     EGFR >60.0 Updated:  03/29/14 0605    CBC and differential [[id number]]  (Abnormal) Collected:  03/29/14 0521    Specimen Information:  Blood / Blood Updated:  03/29/14  0545     WBC 10.23 x10 3/uL      RBC 3.22 (L) x10 6/uL      Hgb 8.4 (L) g/dL      Hematocrit 96.0 (L) %      MCV 83.9 fL      MCH 26.1 (L) pg      MCHC 31.1 (L) g/dL      RDW 14 %      Platelets 322 x10 3/uL      MPV 10.1 fL      Neutrophils 72 %      Lymphocytes Automated 19 %      Monocytes 6 %      Eosinophils Automated 2 %      Basophils Automated 0 %      Immature Granulocyte 0 %      Nucleated RBC 0 /100 WBC      Neutrophils Absolute 7.42 x10 3/uL      Abs Lymph Automated 1.95 x10 3/uL      Abs Mono Automated 0.66 x10 3/uL      Abs Eos Automated 0.18 x10 3/uL      Absolute Baso Automated 0.02 x10 3/uL      Absolute Immature Granulocyte 0.02 x10 3/uL           Recent CBC   Recent Labs      03/29/14   0521   RBC  3.22*   HEMOGLOBIN  8.4*   HEMATOCRIT  27.0*   MCV  83.9   MCH, POC  26.1*   MCHC  31.1*   RDW  14   MPV  10.1     Recent BMP   Recent Labs      03/29/14   0521   GLUCOSE  93   BUN  9   CREATININE  0.7   CALCIUM  8.6   SODIUM  142   POTASSIUM  4.4   CHLORIDE  105   CO2  27       Rads:   Radiological Procedure reviewed.    Assessment:   Bacteremia sepsis of GBS  ESBL positive Chronic Right Leg wound/ Cellulitis   S/p TKA complicated with infection and wound vac  S/p Fever  ? Peripheral neuropathy  Chronic Anemia - baseline  9-10  Hypertension  COPD  Constipation  OA      Plan:   Continue iv Invanz for 4 week course - last date of Abx will be 04/14/14  ID follow up  Cardiology did TEE on 03/26/14 and no vegetation per cardiology  Follow complete report  Continue wound vac  Wound care rn follow up  Per Plastic surgery may do procedure on Monday  Monitor BP  No significant wheezing  Pt/ot follow up  OOB  Continue current management  Lab in am  Adjust pain mgmt      Signed by: Gypsy Balsam Jolissa Kapral,MD

## 2014-03-29 NOTE — Progress Notes (Signed)
INFECTIOUS DISEASES PROGRESS NOTE    Date Time: 03/29/2014 3:14 PM  Patient Name: [name],[name]      Assessment and Recommendations:     Patient Active Problem List   Diagnosis   . Total knee replacement status   . Low back pain   . Chronic obstructive pulmonary disease   . Hypertension   . Debility following multiple R knee revisions   . Non-healing surgical wound- right knee    . Fever   . Leukocytosis   . Anemia   . Hypokalemia   . Chronic infection of prosthetic knee   . COPD (chronic obstructive pulmonary disease)   . HTN (hypertension)   . 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 CX at that time + for  Proteus - tx'd with ciprofloxacin.  This wound  Continues to heal with ongoing vac ap, and is   Very superficial, clean, and s sign of infection.    Recently pt developed an open wound at apex of  Healed right TKR vertical surgical scar. (developed   Under a chronic eschar)   Recent wound  cx grew group c strep and esbl e coli.   Pt admitted with fever, sepsis, leukocytosis  -    Admission blood cxs - 1/2 from 6/10 +  Group b strep (?gu source- urine cx done post  Abx).  F/u bld cxs negative.      Tee  6/19 s valvular vegetation - nl lv fxn    Ct findings noted. - no definite abscess/  ?phlegmonous changs.    All considered, think it reasonable to complete 4 wks  Of iv Invanz -   4 wks of tx will be completed on 04/14/14 -  But we could change end date to Friday  04/10/14   In anticipation of 4th of July holiday.        Subjective:   In good spirits     Antibiotics:   Invanz      Day 12  abx.      picc 6/16    Medications:     Current Facility-Administered Medications   Medication Dose Route Frequency   . amLODIPine  5 mg Oral Daily   . aspirin EC  325 mg Oral Daily   . cloNIDine  0.1 mg Oral Daily   . enoxaparin  40 mg Subcutaneous Daily   . ertapenem  1,000 mg Intravenous Q24H SCH   . gabapentin  300 mg Oral TID   .  hydrochlorothiazide  25 mg Oral Daily   . multivitamin  1 tablet Oral Daily   . nebivolol  5 mg Oral Daily   . potassium chloride  20 mEq Oral BID   . sodium chloride (PF)  10 mL Intravenous Daily   . sodium hypochlorite   Apply externally Daily   . tiotropium  18 mcg Inhalation QAM   . traMADol  50 mg Oral Q6H       Central Access/Endovascular Hardware:     Active PICC Line / CVC Line / PIV Line / Drain / Airway / Intraosseous Line / Epidural Line / ART Line / Line / Wound / Pressure Ulcer / NG/OG Tube    Name:   Placement date:   Placement time:   Site:   Days:    PICC Single Lumen 03/24/14 Right Basilic  03/24/14  1518   Basilic   2    OnQ Pump (Incisional) 08/25/13 knee Right  08/25/13   0939   knee   214    Peripheral IV 09/24/13 Left Hand  09/24/13   0730   Hand   184    Closed/Suction Drain Right Knee 10 Fr.  09/05/13   1401   Knee   202    Negative Pressure Wound Therapy Knee Right        Knee       Epidural Catheter              Incision Site 09/05/13 Knee Right  09/05/13   1437     202    Incision Site 09/15/13 Knee Right  09/15/13   1517     192    Incision Site 09/22/13 Leg Right  09/22/13   1901     185          Review of Systems:   Patient is without fevers, chills, nausea, vomiting, diarrhea, abdominal pain, cough, dyspnea, headache, rash.  Note worse pain in right knee - over last couple of weeks - unclear why    Physical Exam:     Filed Vitals:    03/28/14 2037 03/28/14 2101 03/29/14 0744 03/29/14 1424   BP: 144/56 132/62 142/58 131/62   Pulse: 63 64 64 60   Temp:  97.7 F (36.5 C) 97.3 F (36.3 C) 98.1 F (36.7 C)   TempSrc:   Oral Oral   Resp:  18 17 17    Height:       Weight:       SpO2:  94% 94% 97%       Temp (24hrs), Avg:97.7 F (36.5 C), Min:97.3 F (36.3 C), Max:98.1 F (36.7 C)        In no apparent distress  Sclera anicteric, conjunctivae clear  Oral pharynx s erythema, exudate, or other significant lesions  No Thrush  Neck supple, s lymphadenopathy  Lungs Clear  Heart Regular rate  and rhythm s murmurs, rubs, or gallops  Abdomen Soft, non tender, non distended, normal bowel sounds  Back No Costovertebral angle tenderness, no point tenderness along the spine  Extremities No Cyanosis, no clubbing,   +2 edema right leg   Right knee superior wound 1.6 cm deep by approx 1.2 cm   Scant amt fib exudate, no drainage, no erythema.  No surrounding induration or fluctuance   Tissue mixed fat/gran tissue.    Right inferior knee wound - clean, beefy read - 2 - 3 mm deep  At the most, no surrounding erythema.- [name] shaped -  Roughly 4 x 3 cm  Beautiful pink granulation tissue  Palpable pedal pulses  Neurologic exam is grossly non-focal  Skin without rash     Labs:     Results    Procedure Component Value Units Date/Time    Basic Metabolic Panel [[id number]] Collected:  03/29/14 0521    Specimen Information:  Blood Updated:  03/29/14 0605     Glucose 93 mg/dL      BUN 9 mg/dL      Creatinine 0.7 mg/dL      CALCIUM 8.6 mg/dL      Sodium 409 mEq/[name]      Potassium 4.4 mEq/[name]      Chloride 105 mEq/[name]      CO2 27 mEq/[name]     GFR [[id number]] Collected:  03/29/14 0521     EGFR >60.0 Updated:  03/29/14 9562  CBC and differential [[id number]]  (Abnormal) Collected:  03/29/14 0521    Specimen Information:  Blood / Blood Updated:  03/29/14 0545     WBC 10.23 x10 3/uL      RBC 3.22 ([name]) x10 6/uL      Hgb 8.4 ([name]) g/dL      Hematocrit 40.9 ([name]) %      MCV 83.9 fL      MCH 26.1 ([name]) pg      MCHC 31.1 ([name]) g/dL      RDW 14 %      Platelets 322 x10 3/uL      MPV 10.1 fL      Neutrophils 72 %      Lymphocytes Automated 19 %      Monocytes 6 %      Eosinophils Automated 2 %      Basophils Automated 0 %      Immature Granulocyte 0 %      Nucleated RBC 0 /100 WBC      Neutrophils Absolute 7.42 x10 3/uL      Abs Lymph Automated 1.95 x10 3/uL      Abs Mono Automated 0.66 x10 3/uL      Abs Eos Automated 0.18 x10 3/uL      Absolute Baso Automated 0.02 x10 3/uL      Absolute Immature Granulocyte 0.02 x10 3/uL           Recent Labs  Lab  03/29/14  0521   WBC 10.23   HEMOGLOBIN 8.4*   HEMATOCRIT 27.0*   PLATELETS 322        Recent Labs  Lab 03/29/14  0521  03/27/14  0526   SODIUM 142  < > 143   POTASSIUM 4.4  < > 4.0   CHLORIDE 105  < > 107   CO2 27  < > 29   BUN 9  < > 11   CREATININE 0.7  < > 0.7   CALCIUM 8.6  < > 8.5   ALBUMIN  --   --  2.4*   PROTEIN, TOTAL  --   --  6.5   BILIRUBIN, TOTAL  --   --  0.4   ALKALINE PHOSPHATASE  --   --  73   ALT  --   --  13   AST (SGOT)  --   --  18   GLUCOSE 93  < > 98   < > = values in this interval not displayed.  No results for input(s): INR, APTT in the last 168 hours.       Rads:     Radiology Results (24 Hour)    ** No results found for the last 24 hours. **          Signed by: Ray Church, MD

## 2014-03-29 NOTE — Plan of Care (Signed)
Problem: Safety  Goal: Patient will be free from injury during hospitalization  Outcome: Progressing  Safety measures enforced and maintained siderails up x2 call bell within reach , she has been oob using walker to bsc with mod assist      Problem: Pain  Goal: Patient's pain/discomfort is manageable  Outcome: Progressing  Patient medicated for reported  right leg pain with reported relief rates pain level tolerable at 3/10 will continue to monitor and maintain pain  control

## 2014-03-30 LAB — BASIC METABOLIC PANEL
BUN: 10 mg/dL (ref 7–19)
CO2: 27 mEq/L (ref 22–29)
Calcium: 8.5 mg/dL (ref 7.9–10.2)
Chloride: 105 mEq/L (ref 100–111)
Creatinine: 0.7 mg/dL (ref 0.6–1.0)
Glucose: 89 mg/dL (ref 70–100)
Potassium: 4.1 mEq/L (ref 3.5–5.1)
Sodium: 141 mEq/L (ref 136–145)

## 2014-03-30 LAB — CBC AND DIFFERENTIAL
Basophils Absolute Automated: 0.02 10*3/uL (ref 0.00–0.20)
Basophils Automated: 0 %
Eosinophils Absolute Automated: 0.21 10*3/uL (ref 0.00–0.70)
Eosinophils Automated: 3 %
Hematocrit: 25.1 % — ABNORMAL LOW (ref 37.0–47.0)
Hgb: 8 g/dL — ABNORMAL LOW (ref 12.0–16.0)
Immature Granulocytes Absolute: 0.01 10*3/uL
Immature Granulocytes: 0 %
Lymphocytes Absolute Automated: 1.72 10*3/uL (ref 0.50–4.40)
Lymphocytes Automated: 21 %
MCH: 26.6 pg — ABNORMAL LOW (ref 28.0–32.0)
MCHC: 31.9 g/dL — ABNORMAL LOW (ref 32.0–36.0)
MCV: 83.4 fL (ref 80.0–100.0)
MPV: 10.6 fL (ref 9.4–12.3)
Monocytes Absolute Automated: 0.68 10*3/uL (ref 0.00–1.20)
Monocytes: 8 %
Neutrophils Absolute: 5.43 10*3/uL (ref 1.80–8.10)
Neutrophils: 68 %
Nucleated RBC: 0 /100 WBC (ref 0–1)
Platelets: 333 10*3/uL (ref 140–400)
RBC: 3.01 10*6/uL — ABNORMAL LOW (ref 4.20–5.40)
RDW: 14 % (ref 12–15)
WBC: 8.06 10*3/uL (ref 3.50–10.80)

## 2014-03-30 LAB — GFR: EGFR: >60

## 2014-03-30 NOTE — Plan of Care (Signed)
Problem: Safety  Goal: Patient will be free from injury during hospitalization  Outcome: Progressing  Call bell within reach and encouraged use for any assistance. Tolerated well, assisted to [facility], voided well and had BM per commode. Continue monitor

## 2014-03-30 NOTE — Plan of Care (Signed)
Problem: Moderate/High Fall Risk Score >/=15  Goal: Patient will remain free of falls  Outcome: Progressing    Problem: Health Promotion  Goal: Knowledge - disease process  Extent of understanding conveyed about a specific disease process.   Outcome: Progressing  Pt has been cleared by ID, called Dr. Campbell Lerner this morning and wants pt to stay overnight until tomorrow to evaluate wound.   Goal: Knowledge - health resources  Extent of understanding and conveyed about healthcare resources.   Outcome: Progressing  Plan for d/c tomorrow if cleared by Dr. Campbell Lerner. ID placed req for iv abx, pt has a picc line already. CM has a bed on hold at [facility] in [place], [place].    Problem: Safety  Goal: Patient will be free from injury during hospitalization  Outcome: Progressing    Problem: Pain  Goal: Patient's pain/discomfort is manageable  Outcome: Progressing  Pt verbalized better pain management with ATC tramadol, prn valium and norco.     Problem: Psychosocial and Spiritual Needs  Goal: Demonstrates ability to cope with hospitalization/illness  Outcome: Progressing    Problem: Tissue integrity  Goal: Damaged tissue is healing and protected  Outcome: Progressing  Wound care being managed by wound nurse. Right knee wound cleaned and wound vac dressing changed this morning     Problem: Infection/Potential for Infection  Goal: Free from infection  Outcome: Progressing  Pt under contact isolation for ESBL in wound, PPE and sign outside door. Pt and family compliant with protocol. Pt to be d/c with IV abx to SNF

## 2014-03-30 NOTE — Progress Notes (Signed)
Pt seen for VAC change to 2 wounds on right knee.  Pt has had space in knee since 2014.  Waiting decision on discharge.  Vac reapplied with black foam at cont.  Pt tolerated with pain meds prior to procedure.

## 2014-03-30 NOTE — Progress Notes (Signed)
PROGRESS NOTE    Date Time: 03/30/2014 12:23 PM  Patient Name: [name], [name]      Subjective:   Seen and examined. No complains. Pain under control except when right leg is touched.    Medications:     Current Facility-Administered Medications   Medication Dose Route Frequency   . amLODIPine  5 mg Oral Daily   . aspirin EC  325 mg Oral Daily   . cloNIDine  0.1 mg Oral Daily   . enoxaparin  40 mg Subcutaneous Daily   . ertapenem  1,000 mg Intravenous Q24H SCH   . gabapentin  300 mg Oral TID   . hydrochlorothiazide  25 mg Oral Daily   . multivitamin  1 tablet Oral Daily   . nebivolol  5 mg Oral Daily   . potassium chloride  20 mEq Oral BID   . sodium chloride (PF)  10 mL Intravenous Daily   . sodium hypochlorite   Apply externally Daily   . tiotropium  18 mcg Inhalation QAM   . traMADol  50 mg Oral Q6H       Review of Systems:   Review of Systems -  General ROS: negative for fever or chills  Respiratory ROS: no cough or shortness of breath  Cardiovascular ROS: no chest pain or palpitations  Gastrointestinal ROS: no abdominal pain, nausea or vomiting.  Genito-Urinary ROS: no hematuria  Musculoskeletal ROS: positive for - pain in leg - right but controlled with meds  Neurological ROS:: no focal weakness, numbness or tingling.       Physical Exam:     Filed Vitals:    03/30/14 0812   BP:    Pulse:    Temp:    Resp:    SpO2: 95%       Intake and Output Summary (Last 24 hours) at Date Time    Intake/Output Summary (Last 24 hours) at 03/30/14 1223  Last data filed at 03/29/14 1657   Gross per 24 hour   Intake     50 ml   Output      0 ml   Net     50 ml     General appearance - alert, well appearing and in no distress  Mental status - alert, fully oriented  Neck - supple, no  adenopathy  Chest - clear to auscultation  Heart - RRR, no murmur.  Abdomen - soft, nontender, nondistended  Extremities - tenderness Rt leg with pedal edema, wound vac in place, drsg on right thigh  Neuo- no focal deficit.        Labs:     Results     Procedure Component Value Units Date/Time    Basic Metabolic Panel [[id number]] Collected:  03/30/14 0515    Specimen Information:  Blood Updated:  03/30/14 0627     Glucose 89 mg/dL      BUN 10 mg/dL      Creatinine 0.7 mg/dL      CALCIUM 8.5 mg/dL      Sodium 409 mEq/[name]      Potassium 4.1 mEq/[name]      Chloride 105 mEq/[name]      CO2 27 mEq/[name]     GFR [[id number]] Collected:  03/30/14 0515     EGFR >60.0 Updated:  03/30/14 0627    CBC and differential [[id number]]  (Abnormal) Collected:  03/30/14 0515    Specimen Information:  Blood / Blood Updated:  03/30/14 0600     WBC 8.06 x10 3/uL  RBC 3.01 ([name]) x10 6/uL      Hgb 8.0 ([name]) g/dL      Hematocrit 95.6 ([name]) %      MCV 83.4 fL      MCH 26.6 ([name]) pg      MCHC 31.9 ([name]) g/dL      RDW 14 %      Platelets 333 x10 3/uL      MPV 10.6 fL      Neutrophils 68 %      Lymphocytes Automated 21 %      Monocytes 8 %      Eosinophils Automated 3 %      Basophils Automated 0 %      Immature Granulocyte 0 %      Nucleated RBC 0 /100 WBC      Neutrophils Absolute 5.43 x10 3/uL      Abs Lymph Automated 1.72 x10 3/uL      Abs Mono Automated 0.68 x10 3/uL      Abs Eos Automated 0.21 x10 3/uL      Absolute Baso Automated 0.02 x10 3/uL      Absolute Immature Granulocyte 0.01 x10 3/uL           Rads:   Radiological Procedure reviewed.                Assessment:     Patient Active Problem List   Diagnosis   . Total knee replacement status   . Low back pain   . Chronic obstructive pulmonary disease   . Hypertension   . Debility following multiple R knee revisions   . Non-healing surgical wound- right knee    . Fever   . Leukocytosis   . Anemia   . Hypokalemia   . Chronic infection of prosthetic knee   . COPD (chronic obstructive pulmonary disease)   . HTN (hypertension)   . Infection     Bacteremia sepsis of GBS  ESBL positive Chronic Right Leg wound/ Cellulitis   S/p TKA complicated with infection and wound vac  S/p Fever  ? Peripheral neuropathy  Chronic Anemia - baseline  9-10  Hypertension  COPD  Constipation  OA      Plan:   Continue iv Invanz for 4 week course - last date of Abx will be 04/14/14  ID follow up  Cardiology did TEE on 03/26/14 and no vegetation per cardiology  Continue wound vac  Wound care rn follow up  Per Plastic surgery should not d/c until seen by him  Monitor BP  Pt/ot follow up  OOB  Continue current management  Adjust pain mgmt      Signed by: Everlean Patterson, MD  03/30/2014, 12:23 PM

## 2014-03-30 NOTE — OT Progress Note (Signed)
[facility]  [address]  [place] [address]  [phone]    Occupational Therapy Treatment    Patient: [name]    MRN#: [id number]  Unit: 4A ACUTE JOINT         Bed: K440/N027-25    Medical Diagnosis: Fever [780.60]    Time of treatment:    Time Calculation  OT Received On: 03/30/14  Start Time: 1120  Stop Time: 1200  Time Calculation (min): 40 min    Treatment #  OT Visit Number: 1/5    Precautions and Contraindications:  Fall  Contact isolation    Subjective: "I'd like to get up to a chair"  Patient's medical condition is appropriate for Occupational Therapy  intervention at this time.  Patient is agreeable to participation in the therapy session. Nursing clears patient for therapy.  Pain: 3/10  Location: RLE  Therapist Intervention: RN aware; pt pre-medicated. OT assisted with re-positioning as appropriate.   Patient is satisfied with therapist intervention.    Objective:  Patient is in bed with Intravenous Access and Wound Vac in place.     Cognition:  WFL     Self Care:  Feeding: NT  Grooming: supervision; seated in wheelchair  Bathing: Mod A  UB Dressing: ind  LB Dressing: dependent for socks. Min A to thread pants. Min A for standing balance to don/doff pants around waist  Toileting: Min A for standing balance to don/doff pants around waist and for toilet hygiene     Functional Mobility:  Rolling: NT  Scooting: supervision at EOB   Supine to Sit: supervision  Sit to Supine: NT  Sit to Stand: Min A (completed approximately 5 times)  Transfers: CGA to ambulate approximately 5 feet X2 using FWW. Min A for [facility] transfer.     Treatment Activities: ADls, functional mobility    Observation of Patient:  Vitals:     Filed Vitals:    03/29/14 1424 03/29/14 1957 03/30/14 0803 03/30/14 0812   BP: 131/62 133/63 126/59    Pulse: 60 62 60    Temp: 98.1 F (36.7 C) 96.6 F (35.9 C) 97.5 F (36.4 C)    TempSrc: Oral      Resp: 17 18 18     Height:       Weight:       SpO2: 97% 95% 94% 95%        Education:   Educated the patient to role of occupational therapy, plan of care,  goals of therapy and safety with mobility and ADLs.    Patient left in wheelchair next to bed with all needs met, equipment intact and call bell within reach. RN notified of session outcome.  Updated status posted at bedside.    Assessment:  Pt demonstrated improvement with dynamic standing balance during ADLs, as well as ability to complete LE ADLs. Pt's ability to complete ADLs and functional transfers is impaired due to the following deficits: Pain, activity tolerance, strength, standing balance. Pt would continue to benefit from OT to address these deficits and increase independence with ADLs and functional transfers.     Goals:  Time For Goal Achievement: 5 visits  ADL Goals  Patient will dress lower body: Supervision;with AE  Pt will complete bathing: Supervision (with AE)  Patient will toilet: Supervision  Mobility and Transfer Goals  Pt will transfer bed to [facility]: Supervision     Musculoskeletal Goals  Pt will perform Home Exercise Program: independent;to increase engagement in ADLs  OT Plan  Treatment Interventions: ADL retraining;Functional transfer training;UE strengthening/ROM;Endurance training;Patient/Family training;Equipment eval/education;Compensatory technique education  Discharge Recommendation: SNF  DME Recommended for Discharge:  (defer to next level of rehab)  OT Frequency Recommended: 2-3x/wk    Continue plan of care.    Signature:   Louie Boston, OT  03/30/2014 12:38 PM   Phone: [phone]

## 2014-03-30 NOTE — Plan of Care (Signed)
Problem: Pain  Goal: Patient's pain/discomfort is manageable  Outcome: Progressing  Medicated with 1 tab Norco for pain beginning of shift per pt request. Tolerated well, able to get OOB to [facility] after . Continue monitor

## 2014-03-30 NOTE — Progress Notes (Signed)
[facility]  [address], [place], [place], [address]  [phone]  Outpatient Antibiotic Orders     Diagnosis gbs bacteremia, group c strep/esbl e. Coli right knee  Soft tissue infection associated with gastroc flap closure over right knee  Spacer placed for prior proteus pji  Ongoing wound vac tx    Medication:Ivanz 1 gram q 24 hrs     Start Date: 6/10 Estimated End Date: 04/10/14         Height:1.6 m (5\' 3" )  Weight: 102.195 kg (225 lb 4.8 oz)     Allergies: No Known Allergies  Lab Results   Component Value Date    ESR 112* 03/27/2014         Lab Results   Component Value Date    CRP 11.6* 03/27/2014       Lab Results   Component Value Date    CREAT 0.7 03/30/2014      CREATININE: 0.7 (03/30/14 0515)  Estimated creatinine clearance - 78 mL/min       Home Care Company/Nursing Facility:     Central Access: picc line 6/16    Date Placed: 6/16  Length:    Labs:weekly on mon or tues each week      _x__ CBC with differential q_week      _x__ CMP q_week   _x__ ESR and CRP q_week  _   x Fax all labs to the ordering provider at [phone]    Misc. Orders:   1. Do not discontinue or alter the above treatment plan without notifying the ordering provider.  2. Central access teaching and care per protocol  3. Remove central access at the end of therapy.  4. Alteplase to central access per protocol, once, prn clot. Notify provider if used more than one time.    Call our office weekly to confirm receipt of labs and continuation of treatment    Call id if fever, chills, nausea, vomiting, diarrhea, itching, rash, problems  With picc line.    Call and make appt with id in 3 weeks.          Tillie Rung MD

## 2014-03-31 ENCOUNTER — Ambulatory Visit: Payer: No Typology Code available for payment source

## 2014-03-31 MED ORDER — DIAZEPAM 2 MG PO TABS
2.0000 mg | ORAL_TABLET | Freq: Three times a day (TID) | ORAL | Status: DC | PRN
Start: 2014-03-31 — End: 2014-04-14

## 2014-03-31 MED ORDER — POTASSIUM CHLORIDE CRYS ER 20 MEQ PO TBCR
20.0000 meq | EXTENDED_RELEASE_TABLET | Freq: Two times a day (BID) | ORAL | Status: AC
Start: 2014-03-31 — End: ?

## 2014-03-31 MED ORDER — TRAMADOL HCL 50 MG PO TABS
50.0000 mg | ORAL_TABLET | Freq: Four times a day (QID) | ORAL | Status: DC
Start: 2014-03-31 — End: 2014-07-02

## 2014-03-31 MED ORDER — HYDROCODONE-ACETAMINOPHEN 10-325 MG PO TABS
1.0000 | ORAL_TABLET | Freq: Four times a day (QID) | ORAL | Status: DC | PRN
Start: 2014-03-31 — End: 2014-06-01

## 2014-03-31 MED ORDER — ACETAMINOPHEN 325 MG PO TABS
650.0000 mg | ORAL_TABLET | Freq: Four times a day (QID) | ORAL | Status: DC | PRN
Start: 2014-03-31 — End: 2014-06-19

## 2014-03-31 NOTE — Discharge Summary (Signed)
Discharge Summary    Date Time: 03/31/2014 1:46 PM  Patient Name: [name]  Attending Physician: Everlean Patterson, MD    Date of Admission:   03/18/2014    Date of Discharge:   03/31/2014      Reason for Admission:   Fever [780.60]      Discharge Dx:   Active Problems:    Fever    Leukocytosis    Anemia    Hypokalemia    Chronic infection of prosthetic knee    COPD (chronic obstructive pulmonary disease)    HTN (hypertension)    Infection    Bacteremia sepsis of GBS  ESBL positive Chronic Right Leg wound/ Cellulitis   S/p TKA complicated with infection and wound vac  S/p Fever  ? Peripheral neuropathy  Chronic Anemia - baseline 9-10  Hypertension  COPD  Constipation  OA       Hospital Course:   78 y.o. female with complicated history of right TKA infection with chronic wound who presents to the hospital with fever and chills. She was sent from Dr Bjorn Pippin office to see ID - Dr Dan Humphreys due to a positive wound cx for ESBL. She was advised by Dr. Dan Humphreys to come to the ED.  Patient has had a complicated course including s/p right knee infection, replacement of hardware with abx spacer, medical gastroc muscle flap, delayed healing of suture line at right knee. Patient developed a new wound at proximal suture line in the last several weeks prior to this admission; etiology is unclear but could have been from [facility] dressing that was being bridged to the proximal suture line. Wound culture of the proximal suture line wound is +ESBL E. Coli, MDR.   Pt was admitted to the hosp and started on IV abx, seen by Dr. Campbell Lerner and ID. Placed on wound vac. Had bacteremia sepsis of GBS and ESBL.  PICC line placed.   Pt had TEE done on 03/26/14 and no vegetation per cardiology   Had difficulty with pain management but pain is now better controlled.   Medically stable for d/c.  D/c on Invanz 1 gm by ID.     Discharge Diet   Cardiac Diet    Discharge Activity:   activity as tolerated and PT/OT    Consultations:   I recommend that patient  have a consultation with Internal Medicine, wound care, plastic surgeon.    Procedures performed:   No orders of the defined types were placed in this encounter.       Discharge Medications:     Current Discharge Medication List      START taking these medications    Details   acetaminophen (TYLENOL) 325 MG tablet Take 2 tablets (650 mg total) by mouth every 6 (six) hours as needed.  Qty: 30 tablet, Refills: 0      diazepam (VALIUM) 2 MG tablet Take 1 tablet (2 mg total) by mouth every 8 (eight) hours as needed for Anxiety (Muscle spasms).  Qty: 30 tablet, Refills: 0      HYDROcodone-acetaminophen (NORCO 10-325) 10-325 MG per tablet Take 1 tablet by mouth every 6 (six) hours as needed.  Qty: 30 tablet, Refills: 0         CONTINUE these medications which have CHANGED    Details   potassium chloride (K-DUR,KLOR-CON) 20 MEQ tablet Take 1 tablet (20 mEq total) by mouth 2 (two) times daily.  Qty: 30 tablet, Refills: 0      traMADol (ULTRAM) 50  MG tablet Take 1 tablet (50 mg total) by mouth every 6 (six) hours.  Qty: 30 tablet, Refills: 0         CONTINUE these medications which have NOT CHANGED    Details   amLODIPine (NORVASC) 5 MG tablet Take 5 mg by mouth daily.      aspirin EC 325 MG EC tablet Take 1 tablet (325 mg total) by mouth daily.  Qty: 30 tablet, Refills: 0      cloNIDine (CATAPRES) 0.1 MG tablet Take 1 tablet (0.1 mg total) by mouth daily.  Qty: 30 tablet, Refills: 0      gabapentin (NEURONTIN) 300 MG capsule Take 1 capsule (300 mg total) by mouth every 12 (twelve) hours.  Qty: 60 capsule, Refills: 0      hydrochlorothiazide (HYDRODIURIL) 25 MG tablet Take 25 mg by mouth daily.      multivitamin (MULTIVITAMIN) Tab Take 1 tablet by mouth daily.  Qty: 30 tablet, Refills: 0      nebivolol (BYSTOLIC) 5 MG tablet Take 5 mg by mouth daily.      ondansetron (ZOFRAN-ODT) 4 MG disintegrating tablet Take 1 tablet (4 mg total) by mouth every 8 (eight) hours as needed for Nausea.  Qty: 20 tablet, Refills: 0       tiotropium (SPIRIVA) 18 MCG inhalation capsule Place 18 mcg into inhaler and inhale daily.         STOP taking these medications       albuterol (PROVENTIL) (2.5 MG/3ML) 0.083% nebulizer solution        docusate sodium 100 MG Cap        HYDROcodone-acetaminophen (NORCO) 5-325 MG per tablet        sodium hypochlorite (DAKINS QUARTER STRENGTH) 0.125 % Solution        lidocaine (LIDODERM) 5 %        mupirocin (BACTROBAN) 2 % cream        mupirocin (BACTROBAN) 2 % ointment        polycarbophil (FIBERCON) 625 MG tablet        Senna (SENOKOT) 8.6 MG Tab        traZODone (DESYREL) 100 MG tablet              Scheduled Meds:  Current Facility-Administered Medications   Medication Dose Route Frequency   . amLODIPine  5 mg Oral Daily   . aspirin EC  325 mg Oral Daily   . cloNIDine  0.1 mg Oral Daily   . enoxaparin  40 mg Subcutaneous Daily   . ertapenem  1,000 mg Intravenous Q24H SCH   . gabapentin  300 mg Oral TID   . hydrochlorothiazide  25 mg Oral Daily   . multivitamin  1 tablet Oral Daily   . nebivolol  5 mg Oral Daily   . potassium chloride  20 mEq Oral BID   . sodium chloride (PF)  10 mL Intravenous Daily   . sodium hypochlorite   Apply externally Daily   . tiotropium  18 mcg Inhalation QAM   . traMADol  50 mg Oral Q6H     Continuous Infusions:   PRN Meds:.acetaminophen, diazepam, HYDROcodone-acetaminophen, HYDROmorphone, hydrOXYzine, ondansetron        Signed by: Everlean Patterson, MD  03/31/2014, 1:46 PM

## 2014-03-31 NOTE — Plan of Care (Signed)
Problem: Safety  Goal: Patient will be free from injury during hospitalization  Intervention: Assess patient's risk for falls and implement fall prevention plan of care per policy  Fall prevention precaution is in place and patient is always assisted to gt out of bed.   Intervention: Use appropriate transfer methods  Uses assistive device and stand by assistance to transfer to W/C , to commode.       Problem: Pain  Goal: Patient's pain/discomfort is manageable  Outcome: Progressing  Patient reported pain level of 6/10 of the rt knee and is given anti-pain medication and pain relief is achieved. Will ciontinue  Getting Anti-PAIN MEDICATION.     Problem: Tissue integrity  Goal: Damaged tissue is healing and protected  Outcome: Progressing  Wound vac dressing is in place and is draining dark brown discharge, intact and no leakage noted  Intervention: Reposition patient every 2 hours and PRN unless able to self reposition.  Patient is able to self reposition   Intervention: Keep intact skin clean and dry.  Given CHG wipe and gown changed  Intervention: Monitor external devices/tubes for correct placement to prevent pressure, friction and shearing  Wound vac is in use and funtioning wll.

## 2014-03-31 NOTE — OT Progress Note (Signed)
[facility]  [address]  [place] [address]  [phone]    Occupational Therapy Treatment    Patient: [name]    MRN#: [id number]  Unit: 4A ACUTE JOINT         Bed: Q734/L937-90    Medical Diagnosis: Fever [780.60]    Time of treatment:    Time Calculation  OT Received On: 03/31/14  Start Time: 1005  Stop Time: 1100  Time Calculation (min): 55 min    Treatment #  OT Visit Number: 2/5    Precautions and Contraindications:  Fall   Contact isolation     Subjective: "Why is my leg so sensitive still?"  Patient's medical condition is appropriate for Occupational Therapy  intervention at this time.  Patient is agreeable to participation in the therapy session. Nursing clears patient for therapy.  Pain: 3/10  Location: RLE  Therapist Intervention: RN aware; pt pre-medicated. OT assisted with re-positioning as appropriate.   Patient is satisfied with therapist intervention.    Objective:  Patient is in bed with Intravenous Access and Wound Vac in place.     Cognition:  WFL     Self Care:  Feeding: NT  Grooming: supervision; seated in wheelchair  Bathing: Mod A (without equipment)   UB Dressing: ind  LB Dressing: dependent for socks (supervision with sock aid). CGA to thread pants (supervision with reacher). Min A for standing balance to don/doff pants around waist  Toileting: CGA for standing balance to don/doff pants around waist and for toilet hygiene     Functional Mobility:  Rolling: NT  Scooting: supervision at EOB  Supine to Sit: NT  Sit to Supine: NT  Sit to Stand: Min A (completed approximately 6 times during session)  Transfers: CGA to ambulate approximately 5 feet X3 using FWW. Min A for [facility] transfer.     BUE Exercises: (using medium resistance thera-band with supervision seated in wheelchair):  Shoulder Flexion: 2 X10  Shoulder Abduction: 2 X10  Elbow Flexion: 2 X10  Elbow Extenison: 2 X10  Internal Rotation/External Rotation: 2 X10    Treatment Activities: ADLs, functional mobility,  BUE exercises    Observation of Patient:  Vitals:     Filed Vitals:    03/30/14 2039 03/31/14 0940 03/31/14 1007 03/31/14 1347   BP: 143/66  132/56 136/85   Pulse: 64  61 63   Temp: 98.1 F (36.7 C)  97.7 F (36.5 C) 96.3 F (35.7 C)   TempSrc: Oral      Resp: 18  18 18    Height:       Weight:       SpO2: 97% 98% 97% 95%       Education:   Educated the patient to role of occupational therapy, plan of care,  goals of therapy and safety with mobility and ADLs.    Patient left in wheelchair seated next to bed with all needs met, equipment intact and call bell within reach. RN notified of session outcome.  Updated status posted at bedside.    Assessment:  Improvement with overall activity tolerance this session. Pt continues to demonstrate improvement with dynamic standing balance during ADLs. Pt's ability to complete ADLs and functional transfers is impaired due to the following deficits: Pain, activity tolerance, strength, standing balance. Pt would continue to benefit from OT to address these deficits and increase independence with ADLs and functional transfers.     Goals:  Time For Goal Achievement: 5 visits  ADL  Goals  Patient will dress lower body: Supervision;with AE  Pt will complete bathing: Supervision (with AE)  Patient will toilet: Supervision  Mobility and Transfer Goals  Pt will transfer bed to [facility]: Supervision     Musculoskeletal Goals  Pt will perform Home Exercise Program: independent;to increase engagement in ADLs     OT Plan  Treatment Interventions: ADL retraining;Functional transfer training;UE strengthening/ROM;Endurance training;Patient/Family training;Equipment eval/education;Compensatory technique education  Discharge Recommendation: SNF  DME Recommended for Discharge:  (defer to next level of rehab)  OT Frequency Recommended: 2-3x/wk    Continue plan of care.    Signature:   Louie Boston, OT  03/31/2014 3:20 PM   Phone: [phone]

## 2014-03-31 NOTE — Progress Notes (Signed)
Received patient in bed this am, alert and awake, given all scheduled medications and tolerated well.  Patient complains Rt knee pain, given anti-pain medications and pain control is achieved.  Discharge order is obtained this afternoon, patient's wound vac is discontinued and wet to dry dressing  Is applied, PICC line is not removed, because patient will be receiving iv anti-biotic to the facility she is going.   Report is called to then receiving facility and is given. Patient will be transported by Physician transport.

## 2014-03-31 NOTE — Progress Notes (Signed)
Above reviewed. Afebrile. Knee pain is manageable but still keeping her from walking much. No side effects attributable to the Invanz. Vigilance for yeast, C diff.  Would continue Invanz through July 3rd as outlined, will sign off and f/u PRN. Call if questions.

## 2014-03-31 NOTE — Plan of Care (Signed)
Problem: Safety  Goal: Patient will be free from injury during hospitalization  Outcome: Progressing  Patient is aware and able to call for assistance as needed.  Intervention: Use appropriate transfer methods  Assisted with tranfers, walker in the room      Problem: Pain  Goal: Patient's pain/discomfort is manageable  Outcome: Progressing  Patient has both scheduled and PRN pain medications available for pain relief. Patient stated her pain was at comfort level of 3/10     Problem: Tissue integrity  Goal: Damaged tissue is healing and protected  Outcome: Progressing  Wound covered with dressing, dry clean and intact. Wound vac in place and functioning appropriately.

## 2014-03-31 NOTE — Progress Notes (Signed)
PROGRESS NOTE    Date Time: 03/31/2014 8:48 AM  Patient Name: [name], [name]      Assessment:   18XW female s/p right knee abx spacer and medial gastroc muscle flap with subsequent partial necrosis of suture line distally and new wound at proximal previously healed suture line, both of which have been healing by secondary intention with local wound care.    Plan:   The two wounds are healing well with no gross necrosis. Surgical option of skin graft to knee and complex closure of proximal suture line wound was discussed with patient. Patient at this time wishes to continue with closure by secondary intention. Continue wound VAC to both wounds.   Will f/u with me at [facility] in 1 week.    Subjective:   States that lower leg pain has improved compared to a few days ago.     Medications:     Current Facility-Administered Medications   Medication Dose Route Frequency   . amLODIPine  5 mg Oral Daily   . aspirin EC  325 mg Oral Daily   . cloNIDine  0.1 mg Oral Daily   . enoxaparin  40 mg Subcutaneous Daily   . ertapenem  1,000 mg Intravenous Q24H SCH   . gabapentin  300 mg Oral TID   . hydrochlorothiazide  25 mg Oral Daily   . multivitamin  1 tablet Oral Daily   . nebivolol  5 mg Oral Daily   . potassium chloride  20 mEq Oral BID   . sodium chloride (PF)  10 mL Intravenous Daily   . sodium hypochlorite   Apply externally Daily   . tiotropium  18 mcg Inhalation QAM   . traMADol  50 mg Oral Q6H       Review of Systems:   A comprehensive review of systems was: Dermatological ROS: wound VAC in place; less pain to right lower leg    Physical Exam:     Filed Vitals:    03/30/14 2039   BP: 143/66   Pulse: 64   Temp: 98.1 F (36.7 C)   Resp: 18   SpO2: 97%       Intake and Output Summary (Last 24 hours) at Date Time    Intake/Output Summary (Last 24 hours) at 03/31/14 0848  Last data filed at 03/30/14 1712   Gross per 24 hour   Intake     50 ml   Output      0 ml   Net     50 ml       Skin - wound VAC functioning  over right knee and right thigh wounds; wounds were evaluated last week, and were granulating well with no gross necrosis or purulence; right lower leg edema and tenderness have improved; no crepitus/fluctuance      Rads:   Radiological Procedure reviewed.    Signed by: Ernie Avena , MD

## 2014-03-31 NOTE — Plan of Care (Signed)
Goal for plan of care is achieved and patient is adequate for discharge.

## 2014-04-01 NOTE — Progress Notes (Signed)
Case Management Facility [place] Checklist    Name of Receiving Facility and Bed #:   [facility]     Number for Floor RN to Call Report:    yes   Name and number of discharge nurse and time nurse notified: Akilu   Family Member notified of transfer plan:  y daughter     Fax number of Receiving Facility: yes   Hard Copy of any narcotic prescriptions in envelope to travel with the patient:    yes   Hard Copy of Durable DNR and/or advanced directive in envelope to travel with the patient:  na     Phone number and name of attending physician at receiving facility (MD to MD handoff): yes           Transportation  Mode and Time of Transportation:           SNF Only  I have completed the Medicaid Pre- Screening ([place], DMAS 96 & 97) and faxed it via ECIN/Allscripts to the receiving facility.    yes   I have completed the DMAS 95 - [place]/MR form and faxed it via ECIN/Allscripts to the receiving facility.    yes   Does the DMAS 95 - [place]/MR trigger a Level 2 screening?    na     Medicare Only  I have validated that this patient has a 3 midnight inpatient qualifying stay (SNF only).       I have validated that the patient has received the second Important Message from Medicare (IMM) letter.         The following was routed via Epic to the Receiving Facility:  Document Routed via Epic on Day of Discharge?   History & Physical y   Discharge Medication Reconciliation (.DCMEDLIST in note) y   D/C order y   Last day of PT/OT notes (if applicable) y   D/C Summary y   PICC Line Report (if applicable) y   Most Recent Chest Xray y   All MD Consults y   Isolation Requirement (go to isolation order in other orders section of chart review) y Order and c+s report sent   Urine/Wound/Blood Culture Results  y   Discharge Instructions from AVS (use Pasteboard option to copy & paste from AVS into a progress note) y   Tube Feeding or TPN Order (rate & formula) if applicable na   Last 3 days of MD Progress Notes y

## 2014-04-01 NOTE — Progress Notes (Signed)
Spoke with  [name] after faxing updated [place] info to check if everything needed has been obtained  [facility] and Admissions are  working on auth and I will be notified as soon as this has been obtained

## 2014-04-01 NOTE — Progress Notes (Signed)
Spoke with  [employer] to  Discuss [place] plan.Pt has been accepted at [facility] [place] [facility] [place] given this info to  Ms Shiela Mayer with  [facility] at [facility] admissions to confirm that they have obtained auth for admission from [facility]  Pt and daughter are happy that pt be receiving rehab at [facility] as this is their 1st choice  Will set up transport once I have received confirmtion that Berkley Harvey has been issued

## 2014-04-01 NOTE — Progress Notes (Signed)
Got call from [facility] and pt has been approved for SNF admission  to  [facility] [place]  Will transport pt with a pickup of 5pm via PTS.  No other needs identified

## 2014-04-07 ENCOUNTER — Ambulatory Visit: Payer: No Typology Code available for payment source | Attending: Cardiovascular Disease

## 2014-04-07 DIAGNOSIS — Y834 Other reconstructive surgery as the cause of abnormal reaction of the patient, or of later complication, without mention of misadventure at the time of the procedure: Secondary | ICD-10-CM | POA: Insufficient documentation

## 2014-04-07 DIAGNOSIS — T8189XA Other complications of procedures, not elsewhere classified, initial encounter: Secondary | ICD-10-CM | POA: Insufficient documentation

## 2014-04-07 DIAGNOSIS — I10 Essential (primary) hypertension: Secondary | ICD-10-CM | POA: Insufficient documentation

## 2014-04-07 DIAGNOSIS — J449 Chronic obstructive pulmonary disease, unspecified: Secondary | ICD-10-CM | POA: Insufficient documentation

## 2014-04-07 DIAGNOSIS — J4489 Other specified chronic obstructive pulmonary disease: Secondary | ICD-10-CM | POA: Insufficient documentation

## 2014-04-07 DIAGNOSIS — T8189XD Other complications of procedures, not elsewhere classified, subsequent encounter: Secondary | ICD-10-CM

## 2014-04-07 DIAGNOSIS — Z7982 Long term (current) use of aspirin: Secondary | ICD-10-CM | POA: Insufficient documentation

## 2014-04-14 ENCOUNTER — Ambulatory Visit: Payer: No Typology Code available for payment source | Attending: Cardiovascular Disease

## 2014-04-14 DIAGNOSIS — Y834 Other reconstructive surgery as the cause of abnormal reaction of the patient, or of later complication, without mention of misadventure at the time of the procedure: Secondary | ICD-10-CM | POA: Insufficient documentation

## 2014-04-14 DIAGNOSIS — T8189XA Other complications of procedures, not elsewhere classified, initial encounter: Secondary | ICD-10-CM | POA: Insufficient documentation

## 2014-04-14 DIAGNOSIS — T8189XD Other complications of procedures, not elsewhere classified, subsequent encounter: Secondary | ICD-10-CM

## 2014-04-14 DIAGNOSIS — J449 Chronic obstructive pulmonary disease, unspecified: Secondary | ICD-10-CM | POA: Insufficient documentation

## 2014-04-14 DIAGNOSIS — I10 Essential (primary) hypertension: Secondary | ICD-10-CM | POA: Insufficient documentation

## 2014-04-14 DIAGNOSIS — J4489 Other specified chronic obstructive pulmonary disease: Secondary | ICD-10-CM | POA: Insufficient documentation

## 2014-04-21 ENCOUNTER — Ambulatory Visit: Payer: No Typology Code available for payment source | Attending: Cardiovascular Disease

## 2014-04-21 DIAGNOSIS — J4489 Other specified chronic obstructive pulmonary disease: Secondary | ICD-10-CM | POA: Insufficient documentation

## 2014-04-21 DIAGNOSIS — T8189XD Other complications of procedures, not elsewhere classified, subsequent encounter: Secondary | ICD-10-CM

## 2014-04-21 DIAGNOSIS — Y834 Other reconstructive surgery as the cause of abnormal reaction of the patient, or of later complication, without mention of misadventure at the time of the procedure: Secondary | ICD-10-CM | POA: Insufficient documentation

## 2014-04-21 DIAGNOSIS — Z7982 Long term (current) use of aspirin: Secondary | ICD-10-CM | POA: Insufficient documentation

## 2014-04-21 DIAGNOSIS — Z7401 Bed confinement status: Secondary | ICD-10-CM | POA: Insufficient documentation

## 2014-04-21 DIAGNOSIS — J449 Chronic obstructive pulmonary disease, unspecified: Secondary | ICD-10-CM | POA: Insufficient documentation

## 2014-04-21 DIAGNOSIS — I10 Essential (primary) hypertension: Secondary | ICD-10-CM | POA: Insufficient documentation

## 2014-04-21 DIAGNOSIS — T8189XA Other complications of procedures, not elsewhere classified, initial encounter: Secondary | ICD-10-CM | POA: Insufficient documentation

## 2014-04-21 DIAGNOSIS — L8992 Pressure ulcer of unspecified site, stage 2: Secondary | ICD-10-CM | POA: Insufficient documentation

## 2014-04-21 DIAGNOSIS — L89309 Pressure ulcer of unspecified buttock, unspecified stage: Secondary | ICD-10-CM | POA: Insufficient documentation

## 2014-04-24 ENCOUNTER — Other Ambulatory Visit: Payer: Self-pay

## 2014-04-24 DIAGNOSIS — Z1231 Encounter for screening mammogram for malignant neoplasm of breast: Secondary | ICD-10-CM

## 2014-04-28 ENCOUNTER — Ambulatory Visit: Payer: No Typology Code available for payment source | Attending: Cardiovascular Disease

## 2014-04-28 DIAGNOSIS — I10 Essential (primary) hypertension: Secondary | ICD-10-CM | POA: Insufficient documentation

## 2014-04-28 DIAGNOSIS — Z7401 Bed confinement status: Secondary | ICD-10-CM | POA: Insufficient documentation

## 2014-04-28 DIAGNOSIS — T8189XD Other complications of procedures, not elsewhere classified, subsequent encounter: Secondary | ICD-10-CM

## 2014-04-28 DIAGNOSIS — Y834 Other reconstructive surgery as the cause of abnormal reaction of the patient, or of later complication, without mention of misadventure at the time of the procedure: Secondary | ICD-10-CM | POA: Insufficient documentation

## 2014-04-28 DIAGNOSIS — L89309 Pressure ulcer of unspecified buttock, unspecified stage: Secondary | ICD-10-CM | POA: Insufficient documentation

## 2014-04-28 DIAGNOSIS — T8189XA Other complications of procedures, not elsewhere classified, initial encounter: Secondary | ICD-10-CM | POA: Insufficient documentation

## 2014-04-28 DIAGNOSIS — L8992 Pressure ulcer of unspecified site, stage 2: Secondary | ICD-10-CM | POA: Insufficient documentation

## 2014-04-28 DIAGNOSIS — J4489 Other specified chronic obstructive pulmonary disease: Secondary | ICD-10-CM | POA: Insufficient documentation

## 2014-04-28 DIAGNOSIS — J449 Chronic obstructive pulmonary disease, unspecified: Secondary | ICD-10-CM | POA: Insufficient documentation

## 2014-04-28 DIAGNOSIS — Z7982 Long term (current) use of aspirin: Secondary | ICD-10-CM | POA: Insufficient documentation

## 2014-05-05 ENCOUNTER — Ambulatory Visit: Payer: No Typology Code available for payment source | Attending: Cardiovascular Disease

## 2014-05-05 DIAGNOSIS — Z7401 Bed confinement status: Secondary | ICD-10-CM | POA: Insufficient documentation

## 2014-05-05 DIAGNOSIS — I10 Essential (primary) hypertension: Secondary | ICD-10-CM | POA: Insufficient documentation

## 2014-05-05 DIAGNOSIS — J4489 Other specified chronic obstructive pulmonary disease: Secondary | ICD-10-CM | POA: Insufficient documentation

## 2014-05-05 DIAGNOSIS — Z09 Encounter for follow-up examination after completed treatment for conditions other than malignant neoplasm: Secondary | ICD-10-CM | POA: Insufficient documentation

## 2014-05-05 DIAGNOSIS — J449 Chronic obstructive pulmonary disease, unspecified: Secondary | ICD-10-CM | POA: Insufficient documentation

## 2014-05-05 DIAGNOSIS — T8189XD Other complications of procedures, not elsewhere classified, subsequent encounter: Secondary | ICD-10-CM

## 2014-05-05 DIAGNOSIS — Z7982 Long term (current) use of aspirin: Secondary | ICD-10-CM | POA: Insufficient documentation

## 2014-05-18 ENCOUNTER — Ambulatory Visit: Payer: Medicare Other

## 2014-05-20 ENCOUNTER — Ambulatory Visit
Admission: RE | Admit: 2014-05-20 | Discharge: 2014-05-20 | Disposition: A | Discharge status: Home / Self Care | Payer: Medicare Other | Source: Ambulatory Visit

## 2014-05-20 DIAGNOSIS — Z1231 Encounter for screening mammogram for malignant neoplasm of breast: Secondary | ICD-10-CM

## 2014-06-01 ENCOUNTER — Telehealth: Payer: No Typology Code available for payment source

## 2014-06-01 ENCOUNTER — Ambulatory Visit
Admission: RE | Admit: 2014-06-01 | Discharge: 2014-06-01 | Disposition: A | Discharge status: Home / Self Care | Payer: No Typology Code available for payment source | Source: Ambulatory Visit | Attending: Physician Assistant | Admitting: Physician Assistant

## 2014-06-01 DIAGNOSIS — Z96659 Presence of unspecified artificial knee joint: Secondary | ICD-10-CM | POA: Insufficient documentation

## 2014-06-01 DIAGNOSIS — T8450XA Infection and inflammatory reaction due to unspecified internal joint prosthesis, initial encounter: Secondary | ICD-10-CM | POA: Insufficient documentation

## 2014-06-01 DIAGNOSIS — Z5189 Encounter for other specified aftercare: Secondary | ICD-10-CM

## 2014-06-01 DIAGNOSIS — Y831 Surgical operation with implant of artificial internal device as the cause of abnormal reaction of the patient, or of later complication, without mention of misadventure at the time of the procedure: Secondary | ICD-10-CM | POA: Insufficient documentation

## 2014-06-01 DIAGNOSIS — M25559 Pain in unspecified hip: Secondary | ICD-10-CM

## 2014-06-01 LAB — C-REACTIVE PROTEIN: C-Reactive Protein: 1.1 mg/dL — ABNORMAL HIGH (ref 0.0–0.5)

## 2014-06-01 LAB — SEDIMENTATION RATE: Sed Rate: 44 mm/Hr — ABNORMAL HIGH (ref 0–20)

## 2014-06-01 NOTE — Pre-Procedure Instructions (Addendum)
Cefazolin & Vanco IV Pre OP  T & C 1 Unit PRBC's    Clinic Day 06/09/14    Dr. Janalyn Rouse [phone] Last Cardiac Note, EKG, any recent tests.

## 2014-06-09 ENCOUNTER — Ambulatory Visit: Payer: No Typology Code available for payment source | Attending: Orthopaedic Surgery

## 2014-06-09 DIAGNOSIS — T8189XA Other complications of procedures, not elsewhere classified, initial encounter: Secondary | ICD-10-CM | POA: Insufficient documentation

## 2014-06-09 DIAGNOSIS — S81809A Unspecified open wound, unspecified lower leg, initial encounter: Secondary | ICD-10-CM | POA: Insufficient documentation

## 2014-06-09 DIAGNOSIS — J449 Chronic obstructive pulmonary disease, unspecified: Secondary | ICD-10-CM | POA: Insufficient documentation

## 2014-06-09 DIAGNOSIS — Y849 Medical procedure, unspecified as the cause of abnormal reaction of the patient, or of later complication, without mention of misadventure at the time of the procedure: Secondary | ICD-10-CM | POA: Insufficient documentation

## 2014-06-09 DIAGNOSIS — T8450XA Infection and inflammatory reaction due to unspecified internal joint prosthesis, initial encounter: Secondary | ICD-10-CM

## 2014-06-09 DIAGNOSIS — S91009A Unspecified open wound, unspecified ankle, initial encounter: Secondary | ICD-10-CM | POA: Insufficient documentation

## 2014-06-09 DIAGNOSIS — J4489 Other specified chronic obstructive pulmonary disease: Secondary | ICD-10-CM | POA: Insufficient documentation

## 2014-06-09 DIAGNOSIS — Z96659 Presence of unspecified artificial knee joint: Secondary | ICD-10-CM | POA: Insufficient documentation

## 2014-06-09 DIAGNOSIS — Z01818 Encounter for other preprocedural examination: Secondary | ICD-10-CM | POA: Insufficient documentation

## 2014-06-09 DIAGNOSIS — I10 Essential (primary) hypertension: Secondary | ICD-10-CM | POA: Insufficient documentation

## 2014-06-09 DIAGNOSIS — Y831 Surgical operation with implant of artificial internal device as the cause of abnormal reaction of the patient, or of later complication, without mention of misadventure at the time of the procedure: Secondary | ICD-10-CM | POA: Insufficient documentation

## 2014-06-09 DIAGNOSIS — M25569 Pain in unspecified knee: Secondary | ICD-10-CM | POA: Insufficient documentation

## 2014-06-09 LAB — COMPREHENSIVE METABOLIC PANEL
ALT: 11 U/L (ref 0–55)
AST (SGOT): 15 U/L (ref 5–34)
Albumin/Globulin Ratio: 0.8 — ABNORMAL LOW (ref 0.9–2.2)
Albumin: 3.6 g/dL (ref 3.5–5.0)
Alkaline Phosphatase: 57 U/L (ref 37–106)
BUN: 18 mg/dL (ref 7–19)
Bilirubin, Total: 0.6 mg/dL (ref 0.2–1.2)
CO2: 28 mEq/L (ref 22–29)
Calcium: 9.9 mg/dL (ref 7.9–10.2)
Chloride: 104 mEq/L (ref 100–111)
Creatinine: 0.7 mg/dL (ref 0.6–1.0)
Globulin: 4.6 g/dL — ABNORMAL HIGH (ref 2.0–3.6)
Glucose: 82 mg/dL (ref 70–100)
Potassium: 3.5 mEq/L (ref 3.5–5.1)
Protein, Total: 8.2 g/dL (ref 6.0–8.3)
Sodium: 142 mEq/L (ref 136–145)

## 2014-06-09 LAB — PT/INR
PT INR: 1.1 (ref 0.9–1.1)
PT: 14.3 s (ref 12.6–15.0)

## 2014-06-09 LAB — CBC AND DIFFERENTIAL
Basophils Absolute Automated: 0.03 10*3/uL (ref 0.00–0.20)
Basophils Automated: 0 %
Eosinophils Absolute Automated: 0.13 10*3/uL (ref 0.00–0.70)
Eosinophils Automated: 2 %
Hematocrit: 38 % (ref 37.0–47.0)
Hgb: 12.1 g/dL (ref 12.0–16.0)
Immature Granulocytes Absolute: 0.01 10*3/uL
Immature Granulocytes: 0 %
Lymphocytes Absolute Automated: 2.23 10*3/uL (ref 0.50–4.40)
Lymphocytes Automated: 28 %
MCH: 26.5 pg — ABNORMAL LOW (ref 28.0–32.0)
MCHC: 31.8 g/dL — ABNORMAL LOW (ref 32.0–36.0)
MCV: 83.2 fL (ref 80.0–100.0)
MPV: 10.9 fL (ref 9.4–12.3)
Monocytes Absolute Automated: 0.58 10*3/uL (ref 0.00–1.20)
Monocytes: 7 %
Neutrophils Absolute: 4.9 10*3/uL (ref 1.80–8.10)
Neutrophils: 62 %
Nucleated RBC: 0 /100 WBC (ref 0–1)
Platelets: 244 10*3/uL (ref 140–400)
RBC: 4.57 10*6/uL (ref 4.20–5.40)
RDW: 16 % — ABNORMAL HIGH (ref 12–15)
WBC: 7.87 10*3/uL (ref 3.50–10.80)

## 2014-06-09 LAB — APTT: PTT: 30 s (ref 23–37)

## 2014-06-09 LAB — URINALYSIS, REFLEX TO MICROSCOPIC EXAM IF INDICATED
Bilirubin, UA: NEGATIVE
Blood, UA: NEGATIVE
Glucose, UA: NEGATIVE
Ketones UA: NEGATIVE
Leukocyte Esterase, UA: NEGATIVE
Nitrite, UA: NEGATIVE
Protein, UR: NEGATIVE
Specific Gravity UA: 1.015 (ref 1.001–1.035)
Urine pH: 6 (ref 5.0–8.0)
Urobilinogen, UA: NEGATIVE mg/dL

## 2014-06-09 LAB — ECG 12-LEAD
Atrial Rate: 54 {beats}/min
P Axis: 45 degrees
P-R Interval: 136 ms
Q-T Interval: 444 ms
QRS Duration: 94 ms
QTC Calculation (Bezet): 421 ms
R Axis: 50 degrees
T Axis: 34 degrees
Ventricular Rate: 54 {beats}/min

## 2014-06-09 LAB — ANTIBODY SCREEN: AB Screen Gel: NEGATIVE

## 2014-06-09 LAB — C-REACTIVE PROTEIN: C-Reactive Protein: 0.5 mg/dL (ref 0.0–0.5)

## 2014-06-09 LAB — SEDIMENTATION RATE: Sed Rate: 34 mm/Hr — ABNORMAL HIGH (ref 0–20)

## 2014-06-09 LAB — ABO/RH: ABO Rh: B POS

## 2014-06-09 LAB — GFR: EGFR: >60

## 2014-06-09 NOTE — PT Plan of Care Note (Signed)
[facility]  [address]  [place] [address]  [phone]    Physical Therapy Pre-Operative Intake Form    Patient: [name] MRN: [id number]   Unit: [facility] MAIN PERIOPERATIVE     Precautions  Total Knee Replacement:  (R TKA rev on 06/17/14 by NG MD (now with rod/spacer, txrs onl))  Other Precautions:  (R TKA 08/2013, post op inf with mult sx, )    Social History   Prior Level of Function  Prior level of function:  (transfers only with cane at 40-50% WB; sponge bathes)  DME Currently at Home: Single point cane;Front wheel walker;Wheelchair-manual;3-in-1 BSC;Sock aid;Long-handled shoehorn (shower seat)    Home Living Arrangements  Living Arrangements: Children (Son: Fredia Sorrow)  Home Layout:  (3 STE with rail, 12 with rail inside)  Bathroom Shower/Tub: Pension scheme manager: Raised  Bathroom Equipment: Paediatric nurse  DME Currently at Home: Single point cane;Front wheel walker;Wheelchair-manual;3-in-1 BSC;Sock aid;Long-handled shoehorn (shower seat)  Home Living - Notes / Comments:  (was using chair lift for stairs prior to 08/2013)    Subjective   : Patient is agreeable to participation in the therapy session.  Patient Goal  Patient Goal: driving, walking    Pain Assessment  Patient's Stated Comfort Functional Goal: 2-mild pain     Functional Limits  Pain Level Best: 1  Pain Level Worst: 9    Observation of patient:               Locomotion  Stair Management:  (able to manage 3 STE, does not do stairs inside)         Reviewed pre-operative exercises provided by MD office, post-operative therapy plans.      Plan      Initiate post-op Physical Therapy Evaluation per post-op MD orders.       Signature: Mathews Argyle, PT 06/09/2014 9:03 AM

## 2014-06-09 NOTE — Consults (Signed)
CONSULTATION    Date Time:06/09/2014 10:58 AM  Patient Name: [name], [name]  Requesting Physician: Dr Ahmed Prima     Reason for Consultation:   Medical clearance for surgery.      History:   SAMMY DOUTHITT is a 78 y.o. female who presents for preop evaluation. The patient is scheduled for possible   elective Right Knee Revision /debridement   for worsening joint pain PJI . She feels well overall without any complaints. exercise limited by pain     Chief Complaint:   No chief complaint on file.      Problem List:     Patient Active Problem List   Diagnosis   . Total knee replacement status   . Low back pain   . Chronic obstructive pulmonary disease   . Hypertension   . Debility following multiple R knee revisions   . Non-healing surgical wound- right knee    . Fever   . Leukocytosis   . Anemia   . Hypokalemia   . Chronic infection of prosthetic knee   . COPD (chronic obstructive pulmonary disease)   . HTN (hypertension)   . Infection       Past Medical History:     Past Medical History   Diagnosis Date   . Hypertension    . Pneumonia    . Chronic obstructive pulmonary disease    . Headache(784.0)      not since surgery in 1957   . Arthritis    . Difficulty in walking(719.7)    . Low back pain    . Urinary tract infection    . Depression        Past Surgical History:     Past Surgical History   Procedure Laterality Date   . Cervical spine surgery     . Lumbar spine surgery  posterior w/ hardware   . Tonsillectomy     . Brain surgery  R side/ 1957 Optic nerve pressure   . Eye surgery       cataracts   . Colonoscopy     . Tubal ligation  1961   . Arthroplasty, knee, total  08/25/2013     Procedure: ARTHROPLASTY, KNEE, TOTAL;  Surgeon: Lane Hacker, MD;  Location: [facility];  Service: Orthopedics;  Laterality: Right;   . Arthroplasty, knee, revision total  09/05/2013     Procedure: ARTHROPLASTY, KNEE, REVISION TOTAL;  Surgeon: Lane Hacker, MD;  Location: [facility];  Service: Orthopedics;  Laterality: Right;  RT  KNEE WASHOUT & POLY EXCHANGE **POA/CH** (STRYKER)    . Arthroplasty, knee, resection  09/15/2013     Procedure: ARTHROPLASTY, KNEE, RESECTION;  Surgeon: Lane Hacker, MD;  Location: [facility];  Service: Orthopedics;  Laterality: Right;  AND PLACEMENT OF ANTIBIOTIC SPACER  Placement of wound vac   . Arthroplasty, knee, revision total  09/22/2013     Procedure: ARTHROPLASTY, KNEE, REVISION TOTAL;  Surgeon: Lane Hacker, MD;  Location: [facility];  Service: Orthopedics;  Laterality: Right;   . Debridement, muscle flap closure  09/22/2013     Procedure: DEBRIDEMENT, MUSCLE FLAP CLOSURE;  Surgeon: Ernie Avena, MD;  Location: [facility];  Service: Plastics;  Laterality: Right;  DEBRIDEMENT & MUSCLE FLAP KNEE RT   . Graft, skin  split thickness, (medical)  09/22/2013     Procedure: GRAFT, SKIN  SPLIT THICKNESS, (MEDICAL);  Surgeon: Ernie Avena, MD;  Location: [facility];  Service:  Plastics;  Laterality: N/A;   . Echocardiogram, transesophageal N/A 03/26/2014     Procedure: ECHOCARDIOGRAM, TRANSESOPHAGEAL;  Surgeon: Marni Griffon, MD;  Location: [facility];  Service: Cardiology;  Laterality: N/A;       Family History:   No family history on file.    Social History:     History     Social History   . Marital Status: Widowed     Spouse Name: N/A     Number of Children: N/A   . Years of Education: N/A     Occupational History   . Not on file.     Social History Main Topics   . Smoking status: Former Smoker -- 1.00 packs/day for 45 years     Quit date: 08/20/1998   . Smokeless tobacco: Never Used   . Alcohol Use: Yes      Comment: occassionally   . Drug Use: No   . Sexual Activity: Not on file     Other Topics Concern   . Not on file     Social History Narrative       Allergies:   No Known Allergies    Medications:     Prior to Admission medications    Medication Sig Start Date End Date Taking? Authorizing Provider   acetaminophen (TYLENOL) 325 MG tablet Take 2 tablets (650 mg total) by  mouth every 6 (six) hours as needed. 03/31/14  Yes Elesho, [place], MD   amLODIPine (NORVASC) 5 MG tablet Take 5 mg by mouth daily.   Yes [provider]   aspirin EC 325 MG EC tablet Take 1 tablet (325 mg total) by mouth daily. 08/26/13  Yes Goyal, Nitin, MD   cloNIDine (CATAPRES) 0.1 MG tablet Take 1 tablet (0.1 mg total) by mouth daily. 10/14/13  Yes Kolycheva, Galina N, DO   gabapentin (NEURONTIN) 300 MG capsule Take 1 capsule (300 mg total) by mouth every 12 (twelve) hours.  Patient taking differently: Take 300 mg by mouth daily.    10/14/13  Yes Kolycheva, Galina N, DO   hydrochlorothiazide (HYDRODIURIL) 25 MG tablet Take 25 mg by mouth daily.   Yes [provider]   multivitamin (MULTIVITAMIN) Tab Take 1 tablet by mouth daily. 10/14/13  Yes Kolycheva, Galina N, DO   nebivolol (BYSTOLIC) 5 MG tablet Take 5 mg by mouth daily.   Yes [provider]   potassium chloride (K-DUR,KLOR-CON) 20 MEQ tablet Take 1 tablet (20 mEq total) by mouth 2 (two) times daily. 03/31/14  Yes Elesho, [place], MD   tiotropium (SPIRIVA) 18 MCG inhalation capsule Place 18 mcg into inhaler and inhale daily.   Yes [provider]   traMADol (ULTRAM) 50 MG tablet Take 1 tablet (50 mg total) by mouth every 6 (six) hours.  Patient taking differently: Take 50 mg by mouth every 6 (six) hours as needed.    03/31/14  Yes Elesho, [place], MD   LORATADINE PO Take 10 mg by mouth as needed.    [provider]       Review of Systems:   A comprehensive review of systems was obtained from chart review and the patient.    General ROS: negative for - malaise and fatigue  Psychological ROS: negative for - disorientation or suicidal ideation  Ophthalmic ROS: negative for - blurry vision  ENT ROS: negative for - headaches  Allergy and Immunology ROS: negative  Hematological and Lymphatic ROS: negative for - bleeding problems  Endocrine ROS: negative for - malaise/lethargy  Respiratory ROS: no cough, shortness of  breath, or wheezing  Cardiovascular ROS: no chest pain or dyspnea on exertion  Gastrointestinal ROS: no abdominal pain, change in bowel habits, or black or bloody stools  Genito-Urinary ROS: no dysuria, trouble voiding, or hematuria  Musculoskeletal ROS: positive for - joint pain  Neurological ROS: no TIA or stroke symptoms      Physical Exam:     Filed Vitals:    06/09/14 0958   BP: 188/86   Pulse: 54       General appearance - alert, well appearing, and in no distress  Mental status - alert, oriented to person, place, and time, affect appropriate to mood  Eyes - pupils equal and reactive, sclera anicteric. No pallor.  Ears - right ear normal, left ear normal  Nose - normal, no discharge  Mouth - mucous membranes moist,   Neck - supple, no significant adenopathy  Chest - clear to auscultation, no wheezes, rales or rhonchi, symmetric air entry  Heart - normal rate, regular rhythm, normal S1, S2, no murmurs, rubs, or gallops  Abdomen - soft, nontender, nondistended, no organomegaly  Neurological - alert, oriented, normal speech, no focal findings  Musculoskeletal - Right Knee OA changes with tenderness and painful ROM  Extremities - peripheral pulses normal, no pedal edema    Labs:     Results     Procedure Component Value Units Date/Time    Comprehensive metabolic panel [[id number]]  (Abnormal) Collected:  06/09/14 0945    Specimen Information:  Blood Updated:  06/09/14 1048     Glucose 82 mg/dL      BUN 18 mg/dL      Creatinine 0.7 mg/dL      Sodium 409 mEq/L      Potassium 3.5 mEq/L      Chloride 104 mEq/L      CO2 28 mEq/L      CALCIUM 9.9 mg/dL      Protein, Total 8.2 g/dL      Albumin 3.6 g/dL      AST (SGOT) 15 U/L      ALT 11 U/L      Alkaline Phosphatase 57 U/L      Bilirubin, Total 0.6 mg/dL      Globulin 4.6 (H) g/dL      Albumin/Globulin Ratio 0.8 (L)     C Reactive Protein [[id number]] Collected:  06/09/14 0945    Specimen Information:  Blood Updated:  06/09/14 1048     C-Reactive Protein 0.5 mg/dL     GFR  [[id number]] Collected:  06/09/14 0945     EGFR >60.0 Updated:  06/09/14 1048    UA, Reflex to Microscopic [[id number]]  (Abnormal) Collected:  06/09/14 1014     Urine Type R Kidney Timed Updated:  06/09/14 1025     Color, UA Yellow      Clarity, UA Sl Cloudy (A)      Specific Gravity UA 1.015      Urine pH 6.0      Leukocyte Esterase, UA Negative      Nitrite, UA Negative      Protein, UR Negative      Glucose, UA Negative      Ketones UA Negative      Urobilinogen, UA Negative mg/dL      Bilirubin, UA Negative      Blood, UA Negative     Sedimentation rate (ESR) [[id number]]  (Abnormal) Collected:  06/09/14 0102  Specimen Information:  Blood Updated:  06/09/14 0950     Sed Rate 34 (H) mm/Hr     Protime-INR [[id number]] Collected:  06/09/14 0925    Specimen Information:  Blood Updated:  06/09/14 0945     PT 14.3 sec      PT INR 1.1      PT Anticoag. Given Within 48 hrs. Other:  Specify     APTT [[id number]] Collected:  06/09/14 0925     PTT 30 sec Updated:  06/09/14 0945    CBC and differential [[id number]]  (Abnormal) Collected:  06/09/14 0925    Specimen Information:  Blood / Blood Updated:  06/09/14 0937     WBC 7.87 x10 3/uL      RBC 4.57 x10 6/uL      Hgb 12.1 g/dL      Hematocrit 08.6 %      MCV 83.2 fL      MCH 26.5 (L) pg      MCHC 31.8 (L) g/dL      RDW 16 (H) %      Platelets 244 x10 3/uL      MPV 10.9 fL      Neutrophils 62 %      Lymphocytes Automated 28 %      Monocytes 7 %      Eosinophils Automated 2 %      Basophils Automated 0 %      Immature Granulocyte 0 %      Nucleated RBC 0 /100 WBC      Neutrophils Absolute 4.90 x10 3/uL      Abs Lymph Automated 2.23 x10 3/uL      Abs Mono Automated 0.58 x10 3/uL      Abs Eos Automated 0.13 x10 3/uL      Absolute Baso Automated 0.03 x10 3/uL      Absolute Immature Granulocyte 0.01 x10 3/uL     ABO/Rh [[id number]] Collected:  06/09/14 0925    Specimen Information:  Blood Updated:  06/09/14 0933    Antibody Screen [[id number]] Collected:  06/09/14 0925    Specimen  Information:  Blood Updated:  06/09/14 0933          Rads:     Radiology Results (24 Hour)     ** No results found for the last 24 hours. **          EKG: Normal.    Assessment/Plan:   Pre-operative Evaluation.   PJI rt knee .follow on cx , crp / esr / ID input rec post op   History of COPD. Continue outpatient medications.  Hypertension - Hold diuretic on day of surgery and post-operative, hold blood pressure medicine for SBP<110.  History of Depression/Anxiety-Disorder. Continue current medication.  Home meds reviewed and the patient was advised which medications to take the am of surgery.  No ASA/NSAID's, OTC Meds 10 days prior to surgery as per preop instructions.DVT and GI prophylaxis per orthopedics. Med Rec completed for post-op today and discussed with patient. Pt is medically cleared. Will follow patient post-operatively. Thank you for consultation.    Additional Recommendations:   CBC/BMP POD #1      Signed WN:UUVOZ Truddie Coco, MD

## 2014-06-09 NOTE — Plan of Care (Signed)
Problem: Day of Surgery- Knee Surgery  Goal: Address patient self-management plan (DOS: Knee)  Intervention: Promote Lifestyle Changes/Support Self-Manage Activities  Patient states lifestyle goals are go back home in [place].  Continue treatment plan, offer resources as appropriate.

## 2014-06-11 ENCOUNTER — Ambulatory Visit
Admission: RE | Admit: 2014-06-11 | Discharge: 2014-06-11 | Disposition: A | Discharge status: Home / Self Care | Payer: No Typology Code available for payment source | Source: Ambulatory Visit | Attending: Diagnostic Radiology | Admitting: Diagnostic Radiology

## 2014-06-11 ENCOUNTER — Encounter
Admission: RE | Disposition: A | Discharge status: Home / Self Care | Payer: Self-pay | Source: Ambulatory Visit | Attending: Diagnostic Radiology

## 2014-06-11 ENCOUNTER — Ambulatory Visit: Payer: No Typology Code available for payment source | Admitting: Diagnostic Radiology

## 2014-06-11 DIAGNOSIS — Z538 Procedure and treatment not carried out for other reasons: Secondary | ICD-10-CM | POA: Insufficient documentation

## 2014-06-11 DIAGNOSIS — R609 Edema, unspecified: Secondary | ICD-10-CM

## 2014-06-11 DIAGNOSIS — M25569 Pain in unspecified knee: Secondary | ICD-10-CM | POA: Insufficient documentation

## 2014-06-11 SURGERY — DRAIN (OTHER)
Anesthesia: Local

## 2014-06-11 MED ORDER — LIDOCAINE HCL 2 % IJ SOLN
INTRAMUSCULAR | Status: DC
Start: 2014-06-11 — End: 2014-06-11
  Filled 2014-06-11: qty 20

## 2014-06-11 NOTE — Brief Op Note (Signed)
[facility]  Interventional Radiology  Brief Operative Note     Physician: Hulda Marin, MD  Assistant: None  Pre-Operative Diagnosis: R knee replcement/infection  Post-Operative Diagnosis: Same  Procedure: Attempted R knee aspiration  Anesthesia: Local with 1% Lidocaine  EBL: Minimal  Complications: None immediate/apparent  Blood/IV Fluids: Per nursing documentation  Specimen/Tubes/Lines: None    Findings: Unsuccessful R knee aspiration despite approach from medial and lateral side. No specimen obtained. Patient tolerated procedure well.    Hulda Marin, MD  Department of CardioVascular & Interventional Radiology  Vascular and Interventional Radiology  NeuroInterventional Radiology  [facility]: [phone]  [facility]: [phone]

## 2014-06-11 NOTE — OR Nursing (Signed)
Patient here for aspiration of right knee fluid collection.  Fluoroscopy used with local anesthesia.  No specimen obtained, unable to locate fluid collection.  Patient discharged to home via wheelchair in good condition with daughter.  Christell Constant

## 2014-06-17 ENCOUNTER — Inpatient Hospital Stay
Admission: RE | Admit: 2014-06-17 | Disposition: A | Discharge status: Home / Self Care | Payer: No Typology Code available for payment source | Source: Ambulatory Visit | Admitting: Orthopaedic Surgery

## 2014-06-17 ENCOUNTER — Encounter: Admission: RE | Disposition: A | Discharge status: Home / Self Care | Payer: Self-pay | Source: Ambulatory Visit

## 2014-06-17 HISTORY — DX: Urinary tract infection, site not specified: N39.0

## 2014-06-17 SURGERY — ARTHROPLASTY, KNEE, REVISION TOTAL
Anesthesia: Choice | Laterality: Right

## 2014-06-18 ENCOUNTER — Ambulatory Visit
Admission: RE | Admit: 2014-06-18 | Discharge: 2014-06-18 | Disposition: A | Discharge status: Home / Self Care | Payer: No Typology Code available for payment source | Source: Ambulatory Visit | Attending: Physician Assistant | Admitting: Physician Assistant

## 2014-06-18 DIAGNOSIS — M25559 Pain in unspecified hip: Secondary | ICD-10-CM | POA: Insufficient documentation

## 2014-06-18 DIAGNOSIS — T8450XA Infection and inflammatory reaction due to unspecified internal joint prosthesis, initial encounter: Secondary | ICD-10-CM | POA: Insufficient documentation

## 2014-06-18 DIAGNOSIS — Y831 Surgical operation with implant of artificial internal device as the cause of abnormal reaction of the patient, or of later complication, without mention of misadventure at the time of the procedure: Secondary | ICD-10-CM | POA: Insufficient documentation

## 2014-06-18 LAB — SEDIMENTATION RATE: Sed Rate: 33 mm/Hr — ABNORMAL HIGH (ref 0–20)

## 2014-06-18 LAB — C-REACTIVE PROTEIN: C-Reactive Protein: 0.7 mg/dL (ref 0.0–0.8)

## 2014-06-19 ENCOUNTER — Telehealth: Payer: No Typology Code available for payment source

## 2014-06-19 NOTE — Pre-Procedure Instructions (Addendum)
Cefazolin & Vanco IV  T & C 1 Unit PRBC's    Clinic Day 06/22/14    H/O MRSA Nares    Dr. Penni Bombard [phone]:  Last Cardiac Note, EKG, recent tests    Spoke to [facility] r/t Left knee posted, patient states it is a right knee surgery.

## 2014-06-19 NOTE — OR Nursing (Signed)
Requested that [facility] update/confirm consent + orders in EMR.

## 2014-06-22 ENCOUNTER — Ambulatory Visit: Payer: No Typology Code available for payment source | Attending: Orthopaedic Surgery

## 2014-06-22 DIAGNOSIS — Y831 Surgical operation with implant of artificial internal device as the cause of abnormal reaction of the patient, or of later complication, without mention of misadventure at the time of the procedure: Secondary | ICD-10-CM | POA: Insufficient documentation

## 2014-06-22 DIAGNOSIS — Z01818 Encounter for other preprocedural examination: Secondary | ICD-10-CM | POA: Insufficient documentation

## 2014-06-22 DIAGNOSIS — T8450XA Infection and inflammatory reaction due to unspecified internal joint prosthesis, initial encounter: Secondary | ICD-10-CM | POA: Insufficient documentation

## 2014-06-25 NOTE — Pre-Procedure Instructions (Signed)
Called [facility] to tell them incorrect posting slip.  All documentation states R and posting slip states L.   [facility]  Office stated they will correct.  Thanks

## 2014-06-29 NOTE — Pre-Procedure Instructions (Signed)
Patient has cardiology appointment on 9/22.   Office is aware of needs for clearance, note, and EKG and will fax after appointment.

## 2014-07-01 ENCOUNTER — Inpatient Hospital Stay: Payer: No Typology Code available for payment source

## 2014-07-01 ENCOUNTER — Ambulatory Visit: Payer: Self-pay

## 2014-07-01 ENCOUNTER — Inpatient Hospital Stay: Payer: No Typology Code available for payment source | Admitting: Orthopaedic Surgery

## 2014-07-01 ENCOUNTER — Encounter
Admission: RE | Disposition: A | Discharge status: Home / Self Care | Payer: Self-pay | Source: Ambulatory Visit | Attending: Orthopaedic Surgery

## 2014-07-01 ENCOUNTER — Inpatient Hospital Stay: Payer: No Typology Code available for payment source | Admitting: Registered Nurse

## 2014-07-01 ENCOUNTER — Encounter: Payer: Self-pay | Admitting: Registered Nurse

## 2014-07-01 ENCOUNTER — Inpatient Hospital Stay
Admission: RE | Admit: 2014-07-01 | Discharge: 2014-07-06 | DRG: 464 | Disposition: A | Discharge status: Home / Self Care | Payer: No Typology Code available for payment source | Source: Ambulatory Visit | Attending: Orthopaedic Surgery | Admitting: Orthopaedic Surgery

## 2014-07-01 DIAGNOSIS — I10 Essential (primary) hypertension: Secondary | ICD-10-CM | POA: Diagnosis present

## 2014-07-01 DIAGNOSIS — T8450XA Infection and inflammatory reaction due to unspecified internal joint prosthesis, initial encounter: Principal | ICD-10-CM

## 2014-07-01 DIAGNOSIS — D62 Acute posthemorrhagic anemia: Secondary | ICD-10-CM | POA: Diagnosis present

## 2014-07-01 DIAGNOSIS — Z1619 Resistance to other specified beta lactam antibiotics: Secondary | ICD-10-CM | POA: Diagnosis present

## 2014-07-01 DIAGNOSIS — Z2989 Encounter for other specified prophylactic measures: Secondary | ICD-10-CM

## 2014-07-01 DIAGNOSIS — Z87891 Personal history of nicotine dependence: Secondary | ICD-10-CM

## 2014-07-01 DIAGNOSIS — Z418 Encounter for other procedures for purposes other than remedying health state: Secondary | ICD-10-CM

## 2014-07-01 DIAGNOSIS — Y831 Surgical operation with implant of artificial internal device as the cause of abnormal reaction of the patient, or of later complication, without mention of misadventure at the time of the procedure: Secondary | ICD-10-CM | POA: Diagnosis present

## 2014-07-01 DIAGNOSIS — Z96659 Presence of unspecified artificial knee joint: Secondary | ICD-10-CM

## 2014-07-01 DIAGNOSIS — M1990 Unspecified osteoarthritis, unspecified site: Secondary | ICD-10-CM | POA: Diagnosis present

## 2014-07-01 DIAGNOSIS — J4489 Other specified chronic obstructive pulmonary disease: Secondary | ICD-10-CM | POA: Diagnosis present

## 2014-07-01 DIAGNOSIS — J449 Chronic obstructive pulmonary disease, unspecified: Secondary | ICD-10-CM | POA: Diagnosis present

## 2014-07-01 DIAGNOSIS — Z9660 Presence of unspecified orthopedic joint implant: Secondary | ICD-10-CM

## 2014-07-01 HISTORY — PX: ARTHROPLASTY, KNEE, REVISION TOTAL: SHX3131

## 2014-07-01 LAB — TYPE AND SCREEN
AB Screen Gel: NEGATIVE
ABO Rh: B POS

## 2014-07-01 SURGERY — ARTHROPLASTY, KNEE, REVISION TOTAL
Anesthesia: Regional | Site: Knee | Laterality: Right | Wound class: Contaminated

## 2014-07-01 MED ORDER — KETOROLAC TROMETHAMINE 30 MG/ML IJ SOLN
15.0000 mg | Freq: Four times a day (QID) | INTRAMUSCULAR | Status: AC
Start: 2014-07-01 — End: 2014-07-03
  Administered 2014-07-01 – 2014-07-03 (×5): 15 mg via INTRAVENOUS
  Filled 2014-07-01 (×5): qty 1

## 2014-07-01 MED ORDER — POVIDONE-IODINE 5 % OP SOLN
OPHTHALMIC | Status: AC
Start: 2014-07-01 — End: ?
  Filled 2014-07-01: qty 30

## 2014-07-01 MED ORDER — HYDROXYZINE PAMOATE 25 MG PO CAPS
50.0000 mg | ORAL_CAPSULE | Freq: Two times a day (BID) | ORAL | Status: DC | PRN
Start: 2014-07-01 — End: 2014-07-06

## 2014-07-01 MED ORDER — HYDROCODONE-ACETAMINOPHEN 5-325 MG PO TABS
1.0000 | ORAL_TABLET | ORAL | Status: DC | PRN
Start: 2014-07-01 — End: 2014-07-06
  Administered 2014-07-06: 1 via ORAL

## 2014-07-01 MED ORDER — VANCOMYCIN HCL 1000 MG IV SOLR
1500.0000 mg | Freq: Once | INTRAVENOUS | Status: AC
Start: 2014-07-01 — End: 2014-07-01
  Administered 2014-07-01: 1500 mg via INTRAVENOUS
  Filled 2014-07-01: qty 1500

## 2014-07-01 MED ORDER — PROPOFOL 10 MG/ML IV EMUL
INTRAVENOUS | Status: AC
Start: 2014-07-01 — End: ?
  Filled 2014-07-01: qty 20

## 2014-07-01 MED ORDER — MEPERIDINE HCL 25 MG/ML IJ SOLN
25.0000 mg | INTRAMUSCULAR | Status: DC | PRN
Start: 2014-07-01 — End: 2014-07-01

## 2014-07-01 MED ORDER — ASPIRIN 325 MG PO TBEC
325.0000 mg | DELAYED_RELEASE_TABLET | Freq: Every day | ORAL | Status: DC
Start: 2014-07-02 — End: 2014-07-06
  Administered 2014-07-02 – 2014-07-06 (×5): 325 mg via ORAL
  Filled 2014-07-01 (×5): qty 1

## 2014-07-01 MED ORDER — ACETAMINOPHEN 500 MG PO TABS
1000.0000 mg | ORAL_TABLET | Freq: Once | ORAL | Status: AC
Start: 2014-07-01 — End: 2014-07-01
  Administered 2014-07-01: 1000 mg via ORAL

## 2014-07-01 MED ORDER — CEFAZOLIN SODIUM-DEXTROSE 2-3 GM-% IV SOLR
2000.0000 mg | INTRAVENOUS | Status: DC
Start: 2014-07-01 — End: 2014-07-01
  Administered 2014-07-01: 2 g via INTRAVENOUS

## 2014-07-01 MED ORDER — HYDROMORPHONE HCL PF 1 MG/ML IJ SOLN
0.5000 mg | INTRAMUSCULAR | Status: DC | PRN
Start: 2014-07-01 — End: 2014-07-02

## 2014-07-01 MED ORDER — FAMOTIDINE 10 MG/ML IV SOLN (WRAP)
INTRAVENOUS | Status: DC | PRN
Start: 2014-07-01 — End: 2014-07-02
  Administered 2014-07-01: 20 mg via INTRAVENOUS

## 2014-07-01 MED ORDER — SENNOSIDES-DOCUSATE SODIUM 8.6-50 MG PO TABS
2.0000 | ORAL_TABLET | Freq: Two times a day (BID) | ORAL | Status: DC | PRN
Start: 2014-07-01 — End: 2014-07-06

## 2014-07-01 MED ORDER — PROPOFOL 10 MG/ML IV EMUL
INTRAVENOUS | Status: AC
Start: 2014-07-01 — End: ?
  Filled 2014-07-01: qty 50

## 2014-07-01 MED ORDER — SUFENTANIL CITRATE 50 MCG/ML IV SOLN
INTRAVENOUS | Status: AC
Start: 2014-07-01 — End: ?
  Filled 2014-07-01: qty 1

## 2014-07-01 MED ORDER — HYDROMORPHONE HCL PF 1 MG/ML IJ SOLN
0.5000 mg | INTRAMUSCULAR | Status: DC | PRN
Start: 2014-07-01 — End: 2014-07-01

## 2014-07-01 MED ORDER — AMLODIPINE BESYLATE 5 MG PO TABS
5.0000 mg | ORAL_TABLET | Freq: Every day | ORAL | Status: DC
Start: 2014-07-02 — End: 2014-07-06
  Administered 2014-07-02 – 2014-07-06 (×5): 5 mg via ORAL
  Filled 2014-07-01 (×5): qty 1

## 2014-07-01 MED ORDER — TRANEXAMIC ACID 100 MG/ML IV SOLN
1000.0000 mg | Freq: Once | INTRAVENOUS | Status: AC
Start: 2014-07-01 — End: 2014-07-01
  Administered 2014-07-01: 1000 mg via INTRAVENOUS
  Filled 2014-07-01: qty 10

## 2014-07-01 MED ORDER — ACETAMINOPHEN 325 MG PO TABS
ORAL_TABLET | ORAL | Status: AC
Start: 2014-07-01 — End: 2014-07-01
  Administered 2014-07-01: 650 mg via ORAL
  Filled 2014-07-01: qty 2

## 2014-07-01 MED ORDER — FENTANYL CITRATE 0.05 MG/ML IJ SOLN
25.0000 ug | INTRAMUSCULAR | Status: DC | PRN
Start: 2014-07-01 — End: 2014-07-01

## 2014-07-01 MED ORDER — SCOPOLAMINE 1 MG/3DAYS TD PT72
1.0000 | MEDICATED_PATCH | Freq: Once | TRANSDERMAL | Status: DC
Start: 2014-07-01 — End: 2014-07-04
  Administered 2014-07-01: 1 via TRANSDERMAL

## 2014-07-01 MED ORDER — ACETAMINOPHEN 500 MG PO TABS
ORAL_TABLET | ORAL | Status: AC
Start: 2014-07-01 — End: ?
  Filled 2014-07-01: qty 2

## 2014-07-01 MED ORDER — ONDANSETRON HCL 4 MG/2ML IJ SOLN
INTRAMUSCULAR | Status: AC
Start: 2014-07-01 — End: ?
  Filled 2014-07-01: qty 2

## 2014-07-01 MED ORDER — METOCLOPRAMIDE HCL 5 MG/ML IJ SOLN
INTRAMUSCULAR | Status: AC
Start: 2014-07-01 — End: ?
  Filled 2014-07-01: qty 2

## 2014-07-01 MED ORDER — OXYCODONE HCL ER 10 MG PO T12A
10.0000 mg | EXTENDED_RELEASE_TABLET | Freq: Once | ORAL | Status: AC
Start: 2014-07-01 — End: 2014-07-01
  Administered 2014-07-01: 10 mg via ORAL

## 2014-07-01 MED ORDER — HYDROCODONE-ACETAMINOPHEN 5-325 MG PO TABS
2.0000 | ORAL_TABLET | ORAL | Status: DC | PRN
Start: 2014-07-01 — End: 2014-07-06
  Administered 2014-07-02 – 2014-07-05 (×8): 2 via ORAL
  Filled 2014-07-01 (×9): qty 2

## 2014-07-01 MED ORDER — BACITRACIN 50000 UNITS IM SOLR
INTRAMUSCULAR | Status: DC | PRN
Start: 2014-07-01 — End: 2014-07-01
  Administered 2014-07-01 (×3): 100000 [IU]

## 2014-07-01 MED ORDER — PREGABALIN 75 MG PO CAPS
75.0000 mg | ORAL_CAPSULE | Freq: Two times a day (BID) | ORAL | Status: DC
Start: 2014-07-01 — End: 2014-07-03
  Administered 2014-07-01 – 2014-07-03 (×4): 75 mg via ORAL
  Filled 2014-07-01 (×4): qty 1

## 2014-07-01 MED ORDER — LACTATED RINGERS IV SOLN
INTRAVENOUS | Status: DC
Start: 2014-07-01 — End: 2014-07-01

## 2014-07-01 MED ORDER — LABETALOL HCL 5 MG/ML IV SOLN
INTRAVENOUS | Status: AC
Start: 2014-07-01 — End: ?
  Filled 2014-07-01: qty 20

## 2014-07-01 MED ORDER — CETIRIZINE HCL 10 MG PO TABS
10.0000 mg | ORAL_TABLET | Freq: Every day | ORAL | Status: DC | PRN
Start: 2014-07-02 — End: 2014-07-06

## 2014-07-01 MED ORDER — DEXAMETHASONE SODIUM PHOSPHATE 4 MG/ML IJ SOLN (WRAP)
INTRAMUSCULAR | Status: DC | PRN
Start: 2014-07-01 — End: 2014-07-02
  Administered 2014-07-01: 4 mg via INTRAVENOUS

## 2014-07-01 MED ORDER — SODIUM CHLORIDE 0.9 % IV SOLN
1000.0000 mg | Freq: Once | INTRAVENOUS | Status: AC
Start: 2014-07-01 — End: 2014-07-01
  Administered 2014-07-01: 1000 mg via INTRAVENOUS
  Filled 2014-07-01: qty 10

## 2014-07-01 MED ORDER — ACETAMINOPHEN 325 MG PO TABS
650.0000 mg | ORAL_TABLET | ORAL | Status: DC
Start: 2014-07-01 — End: 2014-07-01

## 2014-07-01 MED ORDER — CEFTRIAXONE SODIUM 1 G IJ SOLR
1.0000 g | INTRAMUSCULAR | Status: DC
Start: 2014-07-01 — End: 2014-07-02
  Administered 2014-07-01 – 2014-07-02 (×2): 1 g via INTRAVENOUS
  Filled 2014-07-01 (×2): qty 1000

## 2014-07-01 MED ORDER — BACITRACIN 50000 UNITS IM SOLR
INTRAMUSCULAR | Status: AC
Start: 2014-07-01 — End: ?
  Filled 2014-07-01: qty 300000

## 2014-07-01 MED ORDER — TAB-A-VITE/BETA CAROTENE PO TABS
1.0000 | ORAL_TABLET | Freq: Every day | ORAL | Status: DC
Start: 2014-07-02 — End: 2014-07-06
  Administered 2014-07-02 – 2014-07-06 (×5): 1 via ORAL
  Filled 2014-07-01 (×5): qty 1

## 2014-07-01 MED ORDER — LIDOCAINE HCL (PF) 2 % IJ SOLN
INTRAMUSCULAR | Status: AC
Start: 2014-07-01 — End: ?
  Filled 2014-07-01: qty 5

## 2014-07-01 MED ORDER — POVIDONE-IODINE 5 % OP SOLN
OPHTHALMIC | Status: DC | PRN
Start: 2014-07-01 — End: 2014-07-01
  Administered 2014-07-01: 600 [drp]

## 2014-07-01 MED ORDER — OXYCODONE HCL ER 10 MG PO T12A
EXTENDED_RELEASE_TABLET | ORAL | Status: AC
Start: 2014-07-01 — End: ?
  Filled 2014-07-01: qty 1

## 2014-07-01 MED ORDER — MIDAZOLAM HCL 5 MG/ML IJ SOLN
INTRAMUSCULAR | Status: DC | PRN
Start: 2014-07-01 — End: 2014-07-02
  Administered 2014-07-01: 2 mg via INTRAVENOUS

## 2014-07-01 MED ORDER — ONDANSETRON HCL 4 MG/2ML IJ SOLN
INTRAMUSCULAR | Status: DC | PRN
Start: 2014-07-01 — End: 2014-07-02
  Administered 2014-07-01: 4 mg via INTRAVENOUS

## 2014-07-01 MED ORDER — FENTANYL CITRATE 0.05 MG/ML IJ SOLN
INTRAMUSCULAR | Status: DC | PRN
Start: 2014-07-01 — End: 2014-07-02
  Administered 2014-07-01: 50 ug via INTRAVENOUS

## 2014-07-01 MED ORDER — NALOXONE HCL 0.4 MG/ML IJ SOLN
0.1000 mg | INTRAMUSCULAR | Status: DC | PRN
Start: 2014-07-01 — End: 2014-07-06

## 2014-07-01 MED ORDER — ONDANSETRON HCL 4 MG/2ML IJ SOLN
4.0000 mg | Freq: Once | INTRAMUSCULAR | Status: DC | PRN
Start: 2014-07-01 — End: 2014-07-01

## 2014-07-01 MED ORDER — VANCOMYCIN HCL POWD
Status: DC | PRN
Start: 2014-07-01 — End: 2014-07-01
  Administered 2014-07-01: 3000 mg

## 2014-07-01 MED ORDER — FAMOTIDINE 20 MG/2ML IV SOLN
INTRAVENOUS | Status: AC
Start: 2014-07-01 — End: ?
  Filled 2014-07-01: qty 2

## 2014-07-01 MED ORDER — CELECOXIB 200 MG PO CAPS
400.0000 mg | ORAL_CAPSULE | Freq: Once | ORAL | Status: AC
Start: 2014-07-01 — End: 2014-07-01
  Administered 2014-07-01: 400 mg via ORAL

## 2014-07-01 MED ORDER — DIPHENHYDRAMINE HCL 50 MG/ML IJ SOLN
6.2500 mg | Freq: Four times a day (QID) | INTRAMUSCULAR | Status: DC | PRN
Start: 2014-07-01 — End: 2014-07-01

## 2014-07-01 MED ORDER — OXYCODONE-ACETAMINOPHEN 5-325 MG PO TABS
1.0000 | ORAL_TABLET | Freq: Once | ORAL | Status: DC | PRN
Start: 2014-07-01 — End: 2014-07-01

## 2014-07-01 MED ORDER — FENTANYL CITRATE 0.05 MG/ML IJ SOLN
INTRAMUSCULAR | Status: AC
Start: 2014-07-01 — End: ?
  Filled 2014-07-01: qty 2

## 2014-07-01 MED ORDER — SODIUM CHLORIDE 0.9 % IV SOLN
INTRAVENOUS | Status: DC
Start: 2014-07-01 — End: 2014-07-03

## 2014-07-01 MED ORDER — FENTANYL 2 MCG/ML+ROPIVACAINE 0.125% EPIDURAL 250 ML
EPIDURAL | Status: DC
Start: 2014-07-01 — End: 2014-07-01

## 2014-07-01 MED ORDER — CELECOXIB 200 MG PO CAPS
ORAL_CAPSULE | ORAL | Status: AC
Start: 2014-07-01 — End: ?
  Filled 2014-07-01: qty 2

## 2014-07-01 MED ORDER — CLONIDINE HCL 0.2 MG PO TABS
0.1000 mg | ORAL_TABLET | Freq: Every day | ORAL | Status: DC
Start: 2014-07-01 — End: 2014-07-06
  Administered 2014-07-01 – 2014-07-06 (×6): 0.1 mg via ORAL
  Filled 2014-07-01 (×6): qty 1

## 2014-07-01 MED ORDER — BISOPROLOL FUMARATE 5 MG PO TABS
5.0000 mg | ORAL_TABLET | Freq: Every day | ORAL | Status: DC
Start: 2014-07-02 — End: 2014-07-01

## 2014-07-01 MED ORDER — ROPIVACAINE HCL 5 MG/ML IJ SOLN
INTRAMUSCULAR | Status: DC | PRN
Start: 2014-07-01 — End: 2014-07-02
  Administered 2014-07-01: 25 mL via PERINEURAL

## 2014-07-01 MED ORDER — CEFAZOLIN SODIUM-DEXTROSE 2-3 GM-% IV SOLR
INTRAVENOUS | Status: AC
Start: 2014-07-01 — End: ?
  Filled 2014-07-01: qty 50

## 2014-07-01 MED ORDER — SODIUM CHLORIDE 0.9 % IJ SOLN
3.0000 mL | Freq: Three times a day (TID) | INTRAMUSCULAR | Status: DC
Start: 2014-07-01 — End: 2014-07-06
  Administered 2014-07-01 – 2014-07-06 (×11): 3 mL via INTRAVENOUS

## 2014-07-01 MED ORDER — TRAMADOL HCL 50 MG PO TABS
50.0000 mg | ORAL_TABLET | Freq: Four times a day (QID) | ORAL | Status: DC
Start: 2014-07-01 — End: 2014-07-06
  Administered 2014-07-01 – 2014-07-06 (×14): 50 mg via ORAL
  Filled 2014-07-01 (×16): qty 1

## 2014-07-01 MED ORDER — PROMETHAZINE HCL 25 MG/ML IJ SOLN
6.2500 mg | Freq: Once | INTRAMUSCULAR | Status: DC | PRN
Start: 2014-07-01 — End: 2014-07-01

## 2014-07-01 MED ORDER — SODIUM CHLORIDE 0.9 % IV SOLN
1.0000 g | Freq: Two times a day (BID) | INTRAVENOUS | Status: DC
Start: 2014-07-02 — End: 2014-07-01
  Filled 2014-07-01: qty 1000

## 2014-07-01 MED ORDER — GABAPENTIN 300 MG PO CAPS
300.0000 mg | ORAL_CAPSULE | Freq: Every day | ORAL | Status: DC
Start: 2014-07-02 — End: 2014-07-06
  Administered 2014-07-02 – 2014-07-06 (×5): 300 mg via ORAL
  Filled 2014-07-01 (×5): qty 1

## 2014-07-01 MED ORDER — SCOPOLAMINE 1 MG/3DAYS TD PT72
MEDICATED_PATCH | TRANSDERMAL | Status: AC
Start: 2014-07-01 — End: ?
  Filled 2014-07-01: qty 1

## 2014-07-01 MED ORDER — VANCOMYCIN HCL 1000 MG IV SOLR
INTRAVENOUS | Status: AC
Start: 2014-07-01 — End: ?
  Filled 2014-07-01: qty 1000

## 2014-07-01 MED ORDER — HYDROMORPHONE HCL PF 1 MG/ML IJ SOLN
1.0000 mg | INTRAMUSCULAR | Status: DC | PRN
Start: 2014-07-01 — End: 2014-07-02
  Administered 2014-07-01: 1 mg via INTRAVENOUS
  Filled 2014-07-01: qty 1

## 2014-07-01 MED ORDER — ONDANSETRON HCL 8 MG PO TABS
4.0000 mg | ORAL_TABLET | Freq: Three times a day (TID) | ORAL | Status: DC | PRN
Start: 2014-07-01 — End: 2014-07-06
  Administered 2014-07-02: 4 mg via ORAL
  Filled 2014-07-01 (×3): qty 1

## 2014-07-01 MED ORDER — VITAMIN C 500 MG PO TABS
500.0000 mg | ORAL_TABLET | Freq: Two times a day (BID) | ORAL | Status: DC
Start: 2014-07-02 — End: 2014-07-06
  Administered 2014-07-02 – 2014-07-06 (×9): 500 mg via ORAL
  Filled 2014-07-01 (×9): qty 1

## 2014-07-01 MED ORDER — BISOPROLOL FUMARATE 5 MG PO TABS
5.0000 mg | ORAL_TABLET | Freq: Every day | ORAL | Status: DC
Start: 2014-07-01 — End: 2014-07-06
  Administered 2014-07-01 – 2014-07-06 (×6): 5 mg via ORAL
  Filled 2014-07-01 (×6): qty 1

## 2014-07-01 MED ORDER — VANCOMYCIN HCL 1000 MG IV SOLR
1500.0000 mg | Freq: Two times a day (BID) | INTRAVENOUS | Status: DC
Start: 2014-07-02 — End: 2014-07-03
  Administered 2014-07-02 (×2): 1500 mg via INTRAVENOUS
  Filled 2014-07-01 (×3): qty 1500

## 2014-07-01 MED ORDER — LIDOCAINE HCL 2 % IJ SOLN
INTRAMUSCULAR | Status: DC | PRN
Start: 2014-07-01 — End: 2014-07-02
  Administered 2014-07-01 (×3): 1 mL

## 2014-07-01 MED ORDER — PROPOFOL INFUSION 10 MG/ML
INTRAVENOUS | Status: DC | PRN
Start: 2014-07-01 — End: 2014-07-02
  Administered 2014-07-01: 50 ug/kg/min via INTRAVENOUS

## 2014-07-01 MED ORDER — LACTATED RINGERS IV SOLN
INTRAVENOUS | Status: DC
Start: 2014-07-01 — End: 2014-07-03

## 2014-07-01 MED ORDER — BUPIVACAINE HCL (PF) 0.75 % IJ SOLN
INTRAMUSCULAR | Status: DC | PRN
Start: 2014-07-01 — End: 2014-07-02
  Administered 2014-07-01: 1.8 mL via INTRASPINAL

## 2014-07-01 MED ORDER — TIOTROPIUM BROMIDE MONOHYDRATE 18 MCG IN CAPS
18.0000 ug | ORAL_CAPSULE | Freq: Every morning | RESPIRATORY_TRACT | Status: DC
Start: 2014-07-02 — End: 2014-07-06
  Administered 2014-07-02 – 2014-07-06 (×5): 18 ug via RESPIRATORY_TRACT
  Filled 2014-07-01: qty 5

## 2014-07-01 MED ORDER — METOCLOPRAMIDE HCL 5 MG/ML IJ SOLN
INTRAMUSCULAR | Status: DC | PRN
Start: 2014-07-01 — End: 2014-07-02
  Administered 2014-07-01: 10 mg via INTRAVENOUS

## 2014-07-01 MED ORDER — MIDAZOLAM HCL 5 MG/5ML IJ SOLN
INTRAMUSCULAR | Status: AC
Start: 2014-07-01 — End: ?
  Filled 2014-07-01: qty 5

## 2014-07-01 MED ORDER — METOCLOPRAMIDE HCL 5 MG/ML IJ SOLN
10.0000 mg | Freq: Once | INTRAMUSCULAR | Status: DC | PRN
Start: 2014-07-01 — End: 2014-07-01

## 2014-07-01 MED ORDER — POTASSIUM CHLORIDE CRYS ER 20 MEQ PO TBCR
20.0000 meq | EXTENDED_RELEASE_TABLET | Freq: Two times a day (BID) | ORAL | Status: DC
Start: 2014-07-02 — End: 2014-07-06
  Administered 2014-07-02 – 2014-07-06 (×9): 20 meq via ORAL
  Filled 2014-07-01 (×9): qty 1

## 2014-07-01 SURGICAL SUPPLY — 103 items
APPLCATOR CHLORAPREP 26ML (Prep) ×4 IMPLANT
BANDAGE ELASTIC 6"X15 YDS (Bandage) ×1
BANDAGE MEDIUM COMPRESSION L15 YD X W6 IN ELASTIC VELCRO POLYESTER (Bandage) ×1 IMPLANT
BANDAGE MEDLINE MEDIUM COMPRESSION L15 (Bandage) ×1
BONE ACHILLES TENDON BLOCK (Bone) ×1 IMPLANT
BOWL CEMENT GUN BATCH W NOZZLE (Ortho Supply) ×2 IMPLANT
CEMENT BONE RADIOPAQUE FULL DOSE SIMPLEX (Cement) ×3 IMPLANT
CEMENT BONE RADIOPAQUE FULL DOSE SIMPLEX P TOBRAMYCIN (Cement) ×3 IMPLANT
CEMENT SIMPLEX P TOBRA 1 PAK (Cement) ×3 IMPLANT
COMP SROM FMR RH X-SM RT (Component) ×1 IMPLANT
COMPONENT FEMORAL 6 D XS L66 MM X W58 MM (Component) ×1 IMPLANT
COMPONENT FEMORAL 6 D XS L66 MM X W58 MM KNEE RIGHT ROTATE HINGE PIN (Component) ×1 IMPLANT
CONTAINER SPECIMEN 4 OZ STRL (Procedure Accessories) ×6 IMPLANT
CUFF TOURNIQUET CYLINDRICAL L30 IN X W4 IN 2 PORT 1 BLADDER QUICK (Procedure Accessories) IMPLANT
CUFF TRNQT CYL CLR CUF 30X4IN LF STRL 2 (Procedure Accessories)
DIFFUSER AIR (Procedure Accessories) ×2 IMPLANT
DRAPE STERI LARGE W/TOWEL (Drape) ×2 IMPLANT
DRAPE STERI U-DRAPE 47X51IN (Drape) ×1
DRAPE SURGICAL ADHESIVE L51 IN X W47 IN (Drape) ×1
DRAPE SURGICAL ADHESIVE L51 IN X W47 IN STERI-DRAPE CLEAR (Drape) ×1 IMPLANT
DRESSING TELFA 3X8IN STERILE (Dressing) ×2 IMPLANT
ELECTRODE ELECTROSRGCL BLADE STD L2.5IN MEGADYNE E-Z CLEAN PTFE MNPL (Blade) ×1 IMPLANT
ELECTRODE ELECTROSURGICAL BLADE STANDARD (Blade) ×1
GLOVE SURG BIOGEL INDIC SZ 8.5 (Glove) ×2 IMPLANT
GLOVE SURG BIOGEL ORTHO SZ8.5 (Glove) ×8 IMPLANT
GOWN SMART XLG (Gown) ×1
GOWN SURGICAL XL SMARTGOWN LEVEL 4 (Gown) ×1
GOWN SURGICAL XL SMARTGOWN LEVEL 4 BREATHABLE (Gown) ×1 IMPLANT
GRAFT SOFT TISSUE FLEXIGRAFT L160-120 MM (Bone) ×1 IMPLANT
GRAFT SOFT TISSUE FLEXIGRAFT L160-120 MM FROZEN BONE BLOCK ACHILLES (Bone) ×1 IMPLANT
HOLDER CATH-MATE LEG STRAP (Procedure Accessories) ×1
HOLDER CATHETER UNIVERSAL ADJUSTABLE (Procedure Accessories) ×1 IMPLANT
HOOD FLYTE PEELAWAY (Procedure Accessories) ×4
HOOD SURGEON ZIPPER PEEL AWAY LENS FLYTE (Procedure Accessories) ×4 IMPLANT
INSERT LPS HINGE XSM 16MM (Insert) ×1 IMPLANT
INSERT TIBIAL XS UNIVERSAL LPS H16 MM (Insert) ×1 IMPLANT
INSERT TIBIAL XS UNIVERSAL LPS H16 MM KNEE BEARING HINGE FLAT ANTERIOR (Insert) ×1 IMPLANT
KIT DRAINAGE L12.5 IN ROUND 3 SPRING (Drain) ×1
KIT DRAINAGE L12.5 IN ROUND 3 SPRING EVACUATOR Y CONNECTOR TUBE HOLE (Drain) ×1 IMPLANT
KIT HEMOVAC 3SPRNG 1/8IN 400CC (Drain) ×1
MANIFOLD 4 PORT NEPTUNE FILTER (Filter) ×2 IMPLANT
NEEDLE 18GX1.5 (Needles) ×2 IMPLANT
PAD ELECTROSRG GRND REM W CRD (Procedure Accessories) ×2 IMPLANT
PADDING CAST L4 YD X W4 IN UNDERCAST (Cast) ×1
PADDING CAST L4 YD X W4 IN UNDERCAST MILD STRETCH COHESIVE REGULAR (Cast) ×1 IMPLANT
PADDING CAST L4 YD X W6 IN UNDERCAST (Cast) ×1
PADDING CAST L4 YD X W6 IN UNDERCAST MILD STRETCH COHESIVE WEBRIL (Cast) ×1 IMPLANT
PADNG CAST 4IN STRL (Cast) ×1
PADNG CAST 6IN STRL (Cast) ×1
SLEEVE FEMORAL L31 MM UNIVERSAL FULL (Component) ×1 IMPLANT
SLEEVE FEMORAL L31 MM UNIVERSAL FULL REVISION NA POROUS KNEE (Component) ×1 IMPLANT
SLEEVE MBT RVSN TIB 37MM (Sleeve) ×1 IMPLANT
SLEEVE TIBIAL L37 MM X W27 MM X H40 MM (Sleeve) ×1 IMPLANT
SLEEVE TIBIAL L37 MM X W27 MM X H40 MM REVISION MBT POROUS KNEE (Sleeve) ×1 IMPLANT
SLEEVE UNIV FMRL POR CTD 31MM (Component) ×1 IMPLANT
SOL ALCOHOL ISOPROPYL 70% 4 OZ (Prep) ×2 IMPLANT
SOL NACL 0.9% IRR 3000CC (Irrigation Solutions) ×3
SOLUTION IRR 0.9% NACL 3L URMTC LF PLS (Irrigation Solutions) ×3
SOLUTION IRRIGATION 0.9% SODIUM CHLORIDE 3000 ML PLASTIC CONTAINER (Irrigation Solutions) ×3 IMPLANT
STAPLER APPOSE ULC 35 W SKIN (Staplers) ×2
STAPLER SKIN L3.9 MM X W6.9 MM WIDE 35 (Skin Closure) ×1
STAPLER SKIN L3.9 MM X W6.9 MM WIDE 35 COUNT FIX HEAD RATCHET (Skin Closure) ×1 IMPLANT
STAPLER SKIN L4.1 MM X W6.5 MM 35 WIDE (Staplers) ×2
STAPLER SKIN L4.1 MM X W6.5 MM 35 WIDE STAPLE CARTRIDGE APPOSE ULC (Staplers) ×2 IMPLANT
STAPLER SKIN PROXIMATE WIDE (Skin Closure) ×1
STEM UNIVERSAL 75X18MM FLUTED (Stem) ×4 IMPLANT
STERILE WATER 1000ML (Solution) ×2
STRAP POSITIONING L19 IN X W3.5 IN (Procedure Accessories) ×1
STRAP POSITIONING L19 IN X W3.5 IN STIRRUP SLIP RING TECLIN (Procedure Accessories) ×1 IMPLANT
STRAP STIRRUP W O RING (Procedure Accessories) ×1
SUTURE COATED VICRYL 1 CP L27 IN BRAID (Suture) ×4
SUTURE COATED VICRYL 1 CP L27 IN BRAID UNDYED ABSORBABLE (Suture) ×4 IMPLANT
SUTURE ETHIBOND 2 V37 4X30IN (Suture) ×2
SUTURE ETHIBOND EXCEL GREEN 2 V-37 L30 (Suture) ×2
SUTURE ETHIBOND EXCEL GREEN 2 V-37 L30 IN BRAID 4 STRAND NONABSORBABLE (Suture) ×2 IMPLANT
SUTURE MOMOCRLY 2-0 UND (Suture) ×4
SUTURE MONOCRYL 2-0 SH L27 IN (Suture) ×4
SUTURE MONOCRYL 2-0 SH L27 IN MONOFILAMENT UNDYED ABSORBABLE (Suture) ×4 IMPLANT
SUTURE VICRYL 1 CP 27IN (Suture) ×4
SYRINGE 20 ML BD LUER-LOK MEDICAL (Syringes, Needles) ×1 IMPLANT
SYRINGE BULB 60CC LATEX FREE (Syringes, Needles) ×2 IMPLANT
SYRINGE LUER-LOK STERILE 20CC (Syringes, Needles) ×1
SYRINGE MED 20ML LL LF STRL (Syringes, Needles) ×1
TIP EZ CLEAN CAUT BLADE 2.5IN (Blade) ×1
TIP SUCTION ARGYLE YANKAUER OD18 FR L25 CM VINYL SMOOTH INNER LUMEN (Tubing) ×1 IMPLANT
TIP SUCTION OD18 FR L25 CM VINYL SMOOTH (Tubing) ×1
TOOL DISECTING 4X14 (Drillbits) ×1
TOOL DISSECTING L14 CM BALL FLUTE OD4 MM (Drillbits) ×1
TOOL DISSECTING L14 CM BALL FLUTE OD4 MM MIDAS REX LEGEND BURR (Drillbits) ×1 IMPLANT
TOURNIQUET 30X4 BLUE (Procedure Accessories)
TOURNIQUET 34X4 PURPLE (Procedure Accessories) ×2 IMPLANT
TOURNIQUET 44X4 BLUE (Procedure Accessories) IMPLANT
TOWEL L26 IN X W17 IN COTTON PREWASH (Procedure Accessories) ×3
TOWEL L26 IN X W17 IN COTTON PREWASH DELINT BLUE ACTISORB SURGICAL (Procedure Accessories) ×3 IMPLANT
TOWEL OR STRL BLUE 17X26IN (Procedure Accessories) ×3
TRAY CATH FOLEY SILICONE 16FR (Catheter Urine) ×4 IMPLANT
TRAY TIB ROTPLAT REV MBT SZ2.5 (Insert) ×1 IMPLANT
TRAY TIBIAL 2.5 KNEE MBT CEMENT REVISION (Insert) ×1 IMPLANT
TRAY TIBIAL 2.5 KNEE MBT CEMENT REVISION ROTATE PLATFORM L67.1 MM X (Insert) ×1 IMPLANT
TRAY TOTAL KNEE PACK (Tray) ×2 IMPLANT
TUBE CULTURE STURTS LIQ BBL (Procedure Accessories) ×2 IMPLANT
TUBE SUCTION FLEX STERILE (Tubing) ×1
WATER STERILE PVC FREE DEHP FREE 1000 ML (Solution) ×2 IMPLANT

## 2014-07-01 NOTE — Anesthesia Preprocedure Evaluation (Signed)
Anesthesia Evaluation    AIRWAY    Mallampati: II    TM distance: >3 FB  Neck ROM: full  Mouth Opening:full   CARDIOVASCULAR    cardiovascular exam normal, regular and normal       DENTAL    no notable dental hx     PULMONARY    pulmonary exam normal and clear to auscultation     OTHER FINDINGS    HTN  COPD controlled  Denies chest pain, SOB  NPO before MN  Intermediate risk surgery  We will proceed  Medical clearance in chart  Chart, EKG, coags, BMP, CBC, allergies reviewed.                  Anesthesia Plan    ASA 2     CSE                     intravenous induction   Detailed anesthesia plan: general IV and CSE        Post op pain management: PNB single shot    informed consent obtained    Plan discussed with CRNA.    ECG reviewed  pertinent labs reviewed

## 2014-07-01 NOTE — Plan of Care (Signed)
Problem: Pain  Goal: Patient's pain/discomfort is manageable  Outcome: Progressing  Pt has complaints of pain rated 10/10. Pt BP also up at 205/88. Pt given dilaudid IV PRN with good effects. Pt pain down to 2/10, BP 171/74. Will continue to monitor.

## 2014-07-01 NOTE — Anesthesia Procedure Notes (Addendum)
Combined Spinal epidural  Patient location during procedure: pre-opReason for block: Primary Anesthesia In the OR  Requested by Surgeon: Yes   Start time: 07/01/2014 12:30 PM  End time: 07/01/2014 12:40 PM  Staffing  Anesthesiologist: Floydene Flock MARIE A  Performed by: anesthesiologist Preanesthetic Checklist Completed: patient identified, surgical consent, pre-op evaluation, timeout performed, risks and benefits discussed, monitors and equipment checked, anesthesia consent given and correct site  Timeout Completed:  07/01/2014 12:25 PM  CSE  Patient monitoring: Pulse oximetry, Nasal cannula O2, EKG and NIBP  Premedication: Yes    Patient position: sitting  Sterile Technique: ChloraPrep, sterile drape, mask  and sterile gloves  Skin Local: lidocaine 1%  Interspace: L3-4  Number of attempts: 1    Approach: midline    Injection technique: LOR air  Needle type: Touhy needle     Needle gauge: 17  CSF Return: Yes  Blood Return: No          Paresthesia Pain: no    Catheter type: side hole    Catheter size: 20 G    Catheter at skin depth: 12 cm  CSF Return: Yes  Blood Return: No      Pressure Paresthesia: no      Assessment   Sensory level: T10  Block Outcome: patient tolerated procedure well, no complications and successful block        Peripheral  Patient location during procedure: PACU  Start time: 07/01/2014 4:58 PM  End time: 07/01/2014 5:08 PM  Block Region: lower extremity  Block type: femoral  Laterality: right  Injection technique: single-shotReason for block: post-op pain managementPreanesthetic Checklist Completed: patient identified, surgical consent, pre-op evaluation, timeout performed, risks and benefits discussed, anesthesia consent given and correct site  Timeout Completed:  07/01/2014 4:58 PM  Peripheral Block  Patient monitoring: pulse oximetry, EKG, NIBP and nasal cannula O2  Patient position: supineSterile Technique: ChloraPrep and sterile glovesPremedication: yes  Procedures: ultrasound guided and nerve  stimulator  Needle  Needle type: Tuohy     Needle gauge: 21 G    Needle length: 4 in            Ultrasound Guided: LA spread visualized, Needle visualized, Relevant anatomy identified (nerve, vessels, muscle) and Image stored or printed    Nerve Stimulator or Paresthesia  Response: twitch, 0.5 mA,   Assessment Incremental injection: yes    Injection made incrementally with aspirations every 5 mL.    Injection Resistance: no  Paresthesia Pain: no    Blood Aspirated: No    no suspected intravascular injection  Block Outcome: patient tolerated procedure well, no complications, successful block and pain improved    Note:  Intrathecal catheter threaded secondary to accidental wet tap, continuous spinal anesthesia performed.

## 2014-07-01 NOTE — Interval H&P Note (Signed)
The history and physical exam completed for this patient has been reviewed. The patient has been seen and examined and there are no apparent interval changes to the patient's status.The patient has been given the opportunity to ask any additional questions regarding the proposed procedure.  The patient appears ready to proceed as planned. All questions were answered.

## 2014-07-01 NOTE — Op Note (Signed)
FULL OPERATIVE NOTE    Date Time: 07/01/2014 5:43 PM  Patient Name: [name]  Attending Physician: Lane Hacker, MD      Date of Operation:   07/01/2014    Providers Performing:   Lane Hacker, M.D.    Assistant (s): Collene Schlichter, III MD    Preoperative Diagnosis:   Right knee periprosthetic joint infection and wound defect status post static antibiotic spacer and medial gastrocnemius flap    Postoperative Diagnosis:   same    Operative Procedure:   1.  Removal of right knee spacer.  2. Intra-operative frozen section performed  3. Removal of synthes large frag screw  4.  Reimplantation of revision right total knee arthroplasty.  5. Distal capsular augmentation with achilles tendon allograft    Anesthesia:   Spinal, epidural    Estimated Blood Loss:   50cc    Fluids:   See anesthesia notes    Specimens:   Frozen section sent to pathology: no more than 3 neutrophils per multiple (>5) high power fields at 400x magnification, no sign of acute inflamation    Deep tissue x 3 to microbiology  Deep fluid (joint) x 1 to microbiology    Complications:   None, stable to PACU.    Implants Utilized:   Tibia:  DePuy PFC Sigma TC3 revision total knee arthroplasty system with size 2.5  rotating platform MBT revision system, rotating platform size 16mm  tibial insert, size 37mm MBT revision metaphyseal sleeve porous-coated,  and size 75mm x 18mm fluted universal stem for the tibia.    Femur:  PFC Sigma size X-small S-ROM Noiles femoral component, Universal femoral sleeve porous-coated size 31mm, size 75mm x 18mm universal fluted stem      Indications:   This is a 78 y.o.-year-old female status post a right knee resection arthroplasty who was seen in the office and scheduled for right knee reimplantation surgery today.  The patient has had slightly elevated ESR and normal CRP labs with negative cultures and synovasure on aspiration.  Upon discussion of the risks and benefits of surgery, the patient was agreeable to  reimplantation surgery.  I did explain that, at the time of surgery, if there was any evidence of infection, the spacer would be changed and we would plan for reimplantation at a later time. Risks including but not limited to periprosthetic fracture, continued periprosthetic infection, blood clot (DVT and PE), aseptic loosening, wear, instability, extensor mechanism injury, stiffness, leg length discrepancy, malalignment, malrotation, myocardial infarction, arrhythmia, cerebrovascular accident, postoperative confusion/delirium, as well as death were explained and discussed at length.  The patient demonstrated an understanding of the risks and potential benefits and decided to proceed with surgery.      Operative Notes:   Prior to initiation of the operative procedure a surgical time-out was taken and the patient's name, medical record, number, date-of-birth, and operative side was verified with the surgical consent form.  Additionally it was verified that the appropriate peri-operative antibiotic administration was completed, as well as that radiographs were available and the correct operative instrumentation and implants were available.    After routine preparation and draping of the patient in the total joint operating room, the esmark bandage was utilized to exsanguinate the extremity and the tourniquet inflated.  A midline skin incision was made over the knee joint utilizing the previous surgical incision.  The medial border of the patella, tibial tubercle, and VMO were clearly identified.  A medial parapatellar approach was made to the right  knee joint.     There was no purulence or inflammation noted upon exposure of the knee joint.  A complete synovectomy including the medial and lateral gutters was done prior to removal of the spacer block.  A medial release was performed using blunt dissection with a cobb elevator and electrocautery.  Subsequently osteotomes were utilized to remove the cement spacer block  and a metal cutting burr was used to cut the 9mm humeral nail. the knee was flexed to 90 degrees.  A homan retractor was then placed in the posterior aspect of the knee and the proximal tibial surface exposed.  Remaining cement was then removed from the tibia and femur utilizing a high-speed burr and a rongeur.      Tissue from the cement bone interface was sent for frozen section. The pathologist came to the room to report no more than 3 neutrophils per multiple (>5) high power fields at 400x magnification, no sign of acute inflammation. We therefore decided to proceed with re-implantation.    Cement was removed from the intramedullary canal of the tibia utilizing reverse curettes and pituitary instruments.  Attention was turned to the femur and intramedullary canal accessed to remove remaining cement utilizing the reverse curettes and pituitary rongeur.  A laminar spreader was then placed and a posterior synovectomy was completed.  At this point the knee was irrigated with pulse lavage with 9L of antibiotic-impregnated irrigation.    At this point the bony defects on the femur and tibia were fully assessed. It was felt that, secondary to bony defects on the femoral and tibial side, a revision system utilizing metaphyseal sleeves may be utilized.  The medial and lateral ligaments were deficient and it was therefore felt that a hinged system was necessary. The patella was also assessed and it was felt that the patella was too thin to accept a new patellar component.      The femoral and tibial canals were hand reamed and shaped to accept a size 75mm x 18mm stem on the femur and 75mm x 18mm stem on the tibia to engage the diaphysis of the bone.    At this point the femur was exposed with blunt human retractors.  The femoral broaches were then utilized in the appropriate orientation in a sequential manner.  The joint line of the femur as estimated based on the medial epicondyle and a point approximately 3cm distal, as  well as utilizing the remaining meniscal scar and the fibular head as landmarks.  The femur was broached and noted to be stable to both axial and torsional force with a 31mm size broach.  Then the tibia was exposed and broached in a sequential manner.  The tibial broach was noted to be stable to both axial and torsional force with a 37mm size broach.  At this point a femoral trial was placed.  There was noted to be both posterior and distal defects after placement of the trial.  A size #X-small S-ROM cemented femoral component was utilized on the femur and a size #2.5 rotating platform tibial tray with a 16mm rotating platform polyethylene was utilized on the tibia.  Metaphyseal sleeves were utilized on both sides.  This construct was noted to be stable, with optimal range of motion, patellar tracking and varus-valgus stability.      The incision was copiously irrigated prior to implantation of the final components. Trial and final reduction revealed excellent alignment, stability, range of motion and patellar tracking, as well as restoration of  the joint line. The components were affixed utilizing the [employer] Simplex P antibiotic bone cement in a hybrid-fixation manner.      The distal aspect of the capsular closure had attenuated tissue. We decided to reinforce the closure with a 6cm x 3.5cm piece of allograft achilles tendon which was sutured circumferentially and centrally across the anterior aspect of the distal capsular closure.     The wound was closed in layers using ethibond and vicryl suture, monocryl suture, and skin staples and nylon suture distally. A superficial surgical drain was placed.     A soft compression dressing was applied.      [employer] S-ROM/MBT Components were utilized for this procedure.      The first-assistant was critical to all steps of the operation, including retraction and leg stabilization during exposure and bone preparation, as well as the deep and  superficial wound closure.  Dr. Ahmed Prima performed and/or supervised the key portions of this surgical procedure, including evaluation of the bone cuts, reshaping of the bony elements, and insertion of the provisional and final components.      Lane Hacker, M.D.    Signed by: Zara Council, MD

## 2014-07-01 NOTE — H&P (View-Only) (Signed)
CONSULTATION    Date Time:06/09/2014 10:58 AM  Patient Name: [name]  Requesting Physician: Dr Goyal     Reason for Consultation:   Medical clearance for surgery.      History:   [name] is a 78 y.o. female who presents for preop evaluation. The patient is scheduled for possible   elective Right Knee Revision /debridement   for worsening joint pain PJI . She feels well overall without any complaints. exercise limited by pain     Chief Complaint:   No chief complaint on file.      Problem List:     Patient Active Problem List   Diagnosis   . Total knee replacement status   . Low back pain   . Chronic obstructive pulmonary disease   . Hypertension   . Debility following multiple R knee revisions   . Non-healing surgical wound- right knee    . Fever   . Leukocytosis   . Anemia   . Hypokalemia   . Chronic infection of prosthetic knee   . COPD (chronic obstructive pulmonary disease)   . HTN (hypertension)   . Infection       Past Medical History:     Past Medical History   Diagnosis Date   . Hypertension    . Pneumonia    . Chronic obstructive pulmonary disease    . Headache(784.0)      not since surgery in 1957   . Arthritis    . Difficulty in walking(719.7)    . Low back pain    . Urinary tract infection    . Depression        Past Surgical History:     Past Surgical History   Procedure Laterality Date   . Cervical spine surgery     . Lumbar spine surgery  posterior w/ hardware   . Tonsillectomy     . Brain surgery  R side/ 1957 Optic nerve pressure   . Eye surgery       cataracts   . Colonoscopy     . Tubal ligation  1961   . Arthroplasty, knee, total  08/25/2013     Procedure: ARTHROPLASTY, KNEE, TOTAL;  Surgeon: Goyal, Nitin, MD;  Location: [facility];  Service: Orthopedics;  Laterality: Right;   . Arthroplasty, knee, revision total  09/05/2013     Procedure: ARTHROPLASTY, KNEE, REVISION TOTAL;  Surgeon: Goyal, Nitin, MD;  Location: [facility];  Service: Orthopedics;  Laterality: Right;  RT  KNEE WASHOUT & POLY EXCHANGE **POA/CH** (STRYKER)    . Arthroplasty, knee, resection  09/15/2013     Procedure: ARTHROPLASTY, KNEE, RESECTION;  Surgeon: Goyal, Nitin, MD;  Location: [facility];  Service: Orthopedics;  Laterality: Right;  AND PLACEMENT OF ANTIBIOTIC SPACER  Placement of wound vac   . Arthroplasty, knee, revision total  09/22/2013     Procedure: ARTHROPLASTY, KNEE, REVISION TOTAL;  Surgeon: Goyal, Nitin, MD;  Location: [facility];  Service: Orthopedics;  Laterality: Right;   . Debridement, muscle flap closure  09/22/2013     Procedure: DEBRIDEMENT, MUSCLE FLAP CLOSURE;  Surgeon: Bhatt, Kirit A, MD;  Location: [facility];  Service: Plastics;  Laterality: Right;  DEBRIDEMENT & MUSCLE FLAP KNEE RT   . Graft, skin  split thickness, (medical)  09/22/2013     Procedure: GRAFT, SKIN  SPLIT THICKNESS, (MEDICAL);  Surgeon: Bhatt, Kirit A, MD;  Location: [facility];  Service:   Plastics;  Laterality: N/A;   . Echocardiogram, transesophageal N/A 03/26/2014     Procedure: ECHOCARDIOGRAM, TRANSESOPHAGEAL;  Surgeon: Ngo, Minh Van, MD;  Location: [facility];  Service: Cardiology;  Laterality: N/A;       Family History:   No family history on file.    Social History:     History     Social History   . Marital Status: Widowed     Spouse Name: N/A     Number of Children: N/A   . Years of Education: N/A     Occupational History   . Not on file.     Social History Main Topics   . Smoking status: Former Smoker -- 1.00 packs/day for 45 years     Quit date: 08/20/1998   . Smokeless tobacco: Never Used   . Alcohol Use: Yes      Comment: occassionally   . Drug Use: No   . Sexual Activity: Not on file     Other Topics Concern   . Not on file     Social History Narrative       Allergies:   No Known Allergies    Medications:     Prior to Admission medications    Medication Sig Start Date End Date Taking? Authorizing Provider   acetaminophen (TYLENOL) 325 MG tablet Take 2 tablets (650 mg total) by  mouth every 6 (six) hours as needed. 03/31/14  Yes Elesho, [place] M, MD   amLODIPine (NORVASC) 5 MG tablet Take 5 mg by mouth daily.   Yes [provider]   aspirin EC 325 MG EC tablet Take 1 tablet (325 mg total) by mouth daily. 08/26/13  Yes Goyal, Nitin, MD   cloNIDine (CATAPRES) 0.1 MG tablet Take 1 tablet (0.1 mg total) by mouth daily. 10/14/13  Yes Kolycheva, Galina N, DO   gabapentin (NEURONTIN) 300 MG capsule Take 1 capsule (300 mg total) by mouth every 12 (twelve) hours.  Patient taking differently: Take 300 mg by mouth daily.    10/14/13  Yes Kolycheva, Galina N, DO   hydrochlorothiazide (HYDRODIURIL) 25 MG tablet Take 25 mg by mouth daily.   Yes [provider]   multivitamin (MULTIVITAMIN) Tab Take 1 tablet by mouth daily. 10/14/13  Yes Kolycheva, Galina N, DO   nebivolol (BYSTOLIC) 5 MG tablet Take 5 mg by mouth daily.   Yes [provider]   potassium chloride (K-DUR,KLOR-CON) 20 MEQ tablet Take 1 tablet (20 mEq total) by mouth 2 (two) times daily. 03/31/14  Yes Elesho, [place] M, MD   tiotropium (SPIRIVA) 18 MCG inhalation capsule Place 18 mcg into inhaler and inhale daily.   Yes [provider]   traMADol (ULTRAM) 50 MG tablet Take 1 tablet (50 mg total) by mouth every 6 (six) hours.  Patient taking differently: Take 50 mg by mouth every 6 (six) hours as needed.    03/31/14  Yes Elesho, [place] M, MD   LORATADINE PO Take 10 mg by mouth as needed.    [provider]       Review of Systems:   A comprehensive review of systems was obtained from chart review and the patient.    General ROS: negative for - malaise and fatigue  Psychological ROS: negative for - disorientation or suicidal ideation  Ophthalmic ROS: negative for - blurry vision  ENT ROS: negative for - headaches  Allergy and Immunology ROS: negative  Hematological and Lymphatic ROS: negative for - bleeding problems    Endocrine ROS: negative for - malaise/lethargy  Respiratory ROS: no cough, shortness of  breath, or wheezing  Cardiovascular ROS: no chest pain or dyspnea on exertion  Gastrointestinal ROS: no abdominal pain, change in bowel habits, or black or bloody stools  Genito-Urinary ROS: no dysuria, trouble voiding, or hematuria  Musculoskeletal ROS: positive for - joint pain  Neurological ROS: no TIA or stroke symptoms      Physical Exam:     Filed Vitals:    06/09/14 0958   BP: 188/86   Pulse: 54       General appearance - alert, well appearing, and in no distress  Mental status - alert, oriented to person, place, and time, affect appropriate to mood  Eyes - pupils equal and reactive, sclera anicteric. No pallor.  Ears - right ear normal, left ear normal  Nose - normal, no discharge  Mouth - mucous membranes moist,   Neck - supple, no significant adenopathy  Chest - clear to auscultation, no wheezes, rales or rhonchi, symmetric air entry  Heart - normal rate, regular rhythm, normal S1, S2, no murmurs, rubs, or gallops  Abdomen - soft, nontender, nondistended, no organomegaly  Neurological - alert, oriented, normal speech, no focal findings  Musculoskeletal - Right Knee OA changes with tenderness and painful ROM  Extremities - peripheral pulses normal, no pedal edema    Labs:     Results     Procedure Component Value Units Date/Time    Comprehensive metabolic panel [[id number]]  (Abnormal) Collected:  06/09/14 0945    Specimen Information:  Blood Updated:  06/09/14 1048     Glucose 82 mg/dL      BUN 18 mg/dL      Creatinine 0.7 mg/dL      Sodium 142 mEq/[name]      Potassium 3.5 mEq/[name]      Chloride 104 mEq/[name]      CO2 28 mEq/[name]      CALCIUM 9.9 mg/dL      Protein, Total 8.2 g/dL      Albumin 3.6 g/dL      AST (SGOT) 15 U/[name]      ALT 11 U/[name]      Alkaline Phosphatase 57 U/[name]      Bilirubin, Total 0.6 mg/dL      Globulin 4.6 (H) g/dL      Albumin/Globulin Ratio 0.8 ([name])     C Reactive Protein [[id number]] Collected:  06/09/14 0945    Specimen Information:  Blood Updated:  06/09/14 1048     C-Reactive Protein 0.5 mg/dL     GFR  [[id number]] Collected:  06/09/14 0945     EGFR >60.0 Updated:  06/09/14 1048    UA, Reflex to Microscopic [[id number]]  (Abnormal) Collected:  06/09/14 1014     Urine Type R Kidney Timed Updated:  06/09/14 1025     Color, UA Yellow      Clarity, UA Sl Cloudy (A)      Specific Gravity UA 1.015      Urine pH 6.0      Leukocyte Esterase, UA Negative      Nitrite, UA Negative      Protein, UR Negative      Glucose, UA Negative      Ketones UA Negative      Urobilinogen, UA Negative mg/dL      Bilirubin, UA Negative      Blood, UA Negative     Sedimentation rate (ESR) [[id number]]  (Abnormal) Collected:  06/09/14 0925      Specimen Information:  Blood Updated:  06/09/14 0950     Sed Rate 34 (H) mm/Hr     Protime-INR [[id number]] Collected:  06/09/14 0925    Specimen Information:  Blood Updated:  06/09/14 0945     PT 14.3 sec      PT INR 1.1      PT Anticoag. Given Within 48 hrs. Other:  Specify     APTT [[id number]] Collected:  06/09/14 0925     PTT 30 sec Updated:  06/09/14 0945    CBC and differential [[id number]]  (Abnormal) Collected:  06/09/14 0925    Specimen Information:  Blood / Blood Updated:  06/09/14 0937     WBC 7.87 x10 3/uL      RBC 4.57 x10 6/uL      Hgb 12.1 g/dL      Hematocrit 38.0 %      MCV 83.2 fL      MCH 26.5 ([name]) pg      MCHC 31.8 ([name]) g/dL      RDW 16 (H) %      Platelets 244 x10 3/uL      MPV 10.9 fL      Neutrophils 62 %      Lymphocytes Automated 28 %      Monocytes 7 %      Eosinophils Automated 2 %      Basophils Automated 0 %      Immature Granulocyte 0 %      Nucleated RBC 0 /100 WBC      Neutrophils Absolute 4.90 x10 3/uL      Abs Lymph Automated 2.23 x10 3/uL      Abs Mono Automated 0.58 x10 3/uL      Abs Eos Automated 0.13 x10 3/uL      Absolute Baso Automated 0.03 x10 3/uL      Absolute Immature Granulocyte 0.01 x10 3/uL     ABO/Rh [[id number]] Collected:  06/09/14 0925    Specimen Information:  Blood Updated:  06/09/14 0933    Antibody Screen [[id number]] Collected:  06/09/14 0925    Specimen  Information:  Blood Updated:  06/09/14 0933          Rads:     Radiology Results (24 Hour)     ** No results found for the last 24 hours. **          EKG: Normal.    Assessment/Plan:   Pre-operative Evaluation.   PJI rt knee .follow on cx , crp / esr / ID input rec post op   History of COPD. Continue outpatient medications.  Hypertension - Hold diuretic on day of surgery and post-operative, hold blood pressure medicine for SBP<110.  History of Depression/Anxiety-Disorder. Continue current medication.  Home meds reviewed and the patient was advised which medications to take the am of surgery.  No ASA/NSAID's, OTC Meds 10 days prior to surgery as per preop instructions.DVT and GI prophylaxis per orthopedics. Med Rec completed for post-op today and discussed with patient. Pt is medically cleared. Will follow patient post-operatively. Thank you for consultation.    Additional Recommendations:   CBC/BMP POD #1      Signed by:Graeson Nouri M Karder Goodin, MD

## 2014-07-01 NOTE — Consults (Signed)
>>   Marliss Czar 07/01/2014 19:47  Pharmacy Note: Vancomycin Dosing Consult    Patient weight: 94.8 kg (208 lb 15.9 oz)   CREATININE: 0.7 (06/09/14 0945)  Estimated creatinine clearance - 73.4 mL/min   WBC:No results for input(s): WBC in the last 168 hours.     Tmax in last 24h: 98.1 C  Vancomycin Indication: R TKA revision, continue pending cultures and ID consult                    Vancomycin Target Trough Level: 10-15 mg/L   Vancomycin 1500mg  given prior to OR at 1233    Maintenance dose:  1500mg  q12hrs    Trough ordered 07/03/14 @ 0030 (prior to 4th dose).    Thank you,  Marliss Czar  Phone: [phone]

## 2014-07-01 NOTE — OR PreOp (Signed)
Verified with Dr Ahmed Prima ok to give antibiotics before surgery.    Also read consent for possible revision gastrocnemius flap/possible split thickness skin graft over the phone to Dr Campbell Lerner.  He is in agreement that this is correct.  Pt spoke with Dr Campbell Lerner on the phone and is in agreement as well.

## 2014-07-01 NOTE — Discharge Summary (Signed)
9/23: R rev TKA reimplant, 50%WB HKB, ASA  IV abx: vanc/ceft pend ID consult  COPD: pulse ox, nut support  Dispo: home    ID consult:   OK to d/c home on Amoxicillin ( note not Augmentin) 875 mg BID for 30 days and 2 refills.  Dr. Dan Humphreys would like to see in office 3-4 weeks.    Discharge Summary    Patient ID:  [name]  [id number]  78 y.o.  March 18, 1936    Admit date: 07/01/2014    Discharge date and time: 07/06/2014  2:44 PM     Admitting Physician: Sheila Hacker, MD     Admission Diagnoses: right knee history of PJI    Discharge Diagnoses: same    Operative Procedures: right total knee revision arthroplasty reimplantation    Indication for Admission: The patient has a history of PJI status post resection arthroplasty. Labs have normalized and serial aspirates have been negative.     Hospital Course: The patient was admitted to the hospital on the day of surgery and underwent the above procedure. Post-operatively, the patient was transferred to the orthopaedic floor.  They received 24 hours of prophylactic intravenous antibiotics.  DVT prophylaxis included SCDs, early ambulation, and Aspirin was started the evening of surgery.  The patient was able to tolerate a regular diet and their pain was reasonably well controlled.  The patient progressed well throughout the hospitalization and was deemed stable for discharge on above listed date.    She was continued on broad spectrum IV abx pending ID recs. At the time of discharge, her intra-op cultures were negative and she was sent home on po Augmentin per ID    Disposition: Home or Self Care    Discharge Medications:       Medication List      START taking these medications          amoxicillin 875 MG tablet   Commonly known as:  AMOXIL   Take 1 tablet (875 mg total) by mouth 2 (two) times daily.       celecoxib 200 MG capsule   Commonly known as:  CELEBREX   Take 1 capsule (200 mg total) by mouth daily.       docusate sodium 100 MG capsule   Commonly known as:  COLACE    Take 1 capsule (100 mg total) by mouth 2 (two) times daily.       HYDROcodone-acetaminophen 5-325 MG per tablet   Commonly known as:  NORCO   Take 1-2 tablets by mouth every 4 (four) hours as needed for Pain (max 4000mg  tylenol/acetaminophen daily).       iron-vitamin C 65-125 MG Tabs   Commonly known as:  VITRON-C   Take 1 tablet by mouth daily.       ondansetron 4 MG tablet   Commonly known as:  ZOFRAN   Take 1 tablet (4 mg total) by mouth every 6 (six) hours as needed for Nausea.         CHANGE how you take these medications          gabapentin 300 MG capsule   Commonly known as:  NEURONTIN   Take 1 capsule (300 mg total) by mouth every 12 (twelve) hours.   What changed:  when to take this       traMADol 50 MG tablet   Commonly known as:  ULTRAM   Take 1 tablet (50 mg total) by mouth every 6 (six) hours as needed  for Pain.   What changed:    - when to take this  - reasons to take this         CONTINUE taking these medications          amLODIPine 5 MG tablet   Commonly known as:  NORVASC       aspirin EC 325 MG EC tablet   Take 1 tablet (325 mg total) by mouth daily.       cloNIDine 0.1 MG tablet   Commonly known as:  CATAPRES   Take 1 tablet (0.1 mg total) by mouth daily.       hydrochlorothiazide 25 MG tablet   Commonly known as:  HYDRODIURIL       LORATADINE PO       multivitamin Tabs   Take 1 tablet by mouth daily.       nebivolol 5 MG tablet   Commonly known as:  BYSTOLIC       potassium chloride 20 MEQ tablet   Commonly known as:  K-DUR,KLOR-CON   Take 1 tablet (20 mEq total) by mouth 2 (two) times daily.       tiotropium 18 MCG inhalation capsule   Commonly known as:  SPIRIVA         Where to Get Your Medications     These are the prescriptions that you need to pick up.         You may get the following medications from any pharmacy   -  amoxicillin 875 MG tablet                Information on where to get these meds is not yet available. Ask your nurse or doctor.         -  aspirin EC 325 MG EC tablet   -   celecoxib 200 MG capsule   -  docusate sodium 100 MG capsule   -  HYDROcodone-acetaminophen 5-325 MG per tablet   -  iron-vitamin C 65-125 MG Tabs   -  ondansetron 4 MG tablet   -  traMADol 50 MG tablet                      Patient Instructions:     The patient will notify me for any increased bleeding, drainage, or progressively worsening pain, or other concerning symptoms.  They have been instructed to report to the emergency room immediately for any chest pain or shortness of breath.     Activity Precautions: 50% WB on RLE in HKB locked in extension      Wound Care: The patient may shower when they get home and pad the incision dry afterwards.  The patient should re-dress the incision with a clean dry dressing to be changed daily until the staples are removed.    DVT prophylaxis: Aspirin 325mg  PO daily for 4 weeks.    Follow-up visit at [facility] 2 weeks post-op.    Signed:  Collene Schlichter III  07/07/2014  6:49 AM

## 2014-07-01 NOTE — Plan of Care (Signed)
Problem: Pain  Goal: Patient's pain/discomfort is manageable  Outcome: Progressing    Problem: Day of Surgery- Knee Surgery  Goal: Hemodynamic Stability  Outcome: Progressing  Received patient from PACU, very drowsy and sleepy. C/o moderate amount of pain to the right kee when awake. BP 189/104 HR 70 02 sat 99% on 2L nc. Clonidine 0.1mg  and zebeta given at 1920. Medicated with toradol and tramadol. Patient still sleepy, unable to stay awake to eat dinner. Will continue to monitor VS. Family at bed side.  Goal: Pain at adequate level as identified by patient  Outcome: Progressing  Goal: Stable Neurovascular Status  Outcome: Progressing  +plantar/dorsiflexion. +sensation. Knee immobilizer in place. Dressing to the right knee dry and intact.    Problem: Inadequate Gas Exchange  Goal: Adequately oxygenating and ventilation is improved  Outcome: Progressing

## 2014-07-01 NOTE — Transfer of Care (Addendum)
Anesthesia Transfer of Care Note    Patient: [name]    Procedures performed: Procedure(s):  ARTHROPLASTY, KNEE, REVISION TOTAL    Anesthesia type: Spinal    Patient location:Phase I PACU    Last vitals:   Filed Vitals:    07/01/14 1715   BP: 152/68   Pulse: 53   Temp: 36.1 C (97 F)   Resp: 12   SpO2: 100%       Post pain: Patient not complaining of pain, continue current therapy      Mental Status:awake and alert     Respiratory Function: tolerating nasal cannula    Cardiovascular: stable    Nausea/Vomiting: patient not complaining of nausea or vomiting    Hydration Status: adequate    Post assessment: no apparent anesthetic complications, no reportable events and no evidence of recall     Intrathecal catheter removed, tip intact.

## 2014-07-01 NOTE — Progress Notes (Signed)
Pharmacy Note: Vancomycin Dosing Consult    Patient weight: 94.8 kg (208 lb 15.9 oz)   CREATININE: 0.7 (06/09/14 0945)  Estimated creatinine clearance - 73.4 mL/min   WBC:No results for input(s): WBC in the last 168 hours.     Tmax in last 24h: 98.1 C  Vancomycin Indication: R TKA revision, continue pending cultures and ID consult                    Vancomycin Target Trough Level: 10-15 mg/L   Vancomycin 1500mg  given prior to OR at 1233    Maintenance dose:  1500mg  q12hrs    Trough ordered 07/03/14 @ 0030 (prior to 4th dose).    Thank you,  Marliss Czar  Phone: [phone]

## 2014-07-02 ENCOUNTER — Encounter: Payer: Self-pay | Admitting: Orthopaedic Surgery

## 2014-07-02 LAB — BASIC METABOLIC PANEL
BUN: 14 mg/dL (ref 7–19)
CO2: 26 mEq/L (ref 22–29)
Calcium: 8.8 mg/dL (ref 7.9–10.2)
Chloride: 108 mEq/L (ref 100–111)
Creatinine: 0.7 mg/dL (ref 0.6–1.0)
Glucose: 120 mg/dL — ABNORMAL HIGH (ref 70–100)
Potassium: 3.9 mEq/L (ref 3.5–5.1)
Sodium: 142 mEq/L (ref 136–145)

## 2014-07-02 LAB — HEMOGLOBIN AND HEMATOCRIT, BLOOD
Hematocrit: 34.9 % — ABNORMAL LOW (ref 37.0–47.0)
Hgb: 10.6 g/dL — ABNORMAL LOW (ref 12.0–16.0)

## 2014-07-02 LAB — RED BLOOD CELLS OR HOLD

## 2014-07-02 LAB — LAB USE ONLY - HISTORICAL SURGICAL PATHOLOGY

## 2014-07-02 LAB — GFR: EGFR: >60

## 2014-07-02 MED ORDER — HYDROCODONE-ACETAMINOPHEN 5-325 MG PO TABS
1.0000 | ORAL_TABLET | ORAL | Status: DC | PRN
Start: 2014-07-02 — End: 2018-01-24

## 2014-07-02 MED ORDER — SALINE SPRAY 0.65 % NA SOLN
2.0000 | Freq: Two times a day (BID) | NASAL | Status: DC
Start: 2014-07-02 — End: 2014-07-06
  Administered 2014-07-02 – 2014-07-06 (×9): 2 via NASAL
  Filled 2014-07-02: qty 44

## 2014-07-02 MED ORDER — FERROUS SULFATE 324 (65 FE) MG PO TBEC
324.0000 mg | DELAYED_RELEASE_TABLET | Freq: Every morning | ORAL | Status: DC
Start: 2014-07-02 — End: 2014-07-06
  Administered 2014-07-02 – 2014-07-06 (×5): 324 mg via ORAL
  Filled 2014-07-02 (×5): qty 1

## 2014-07-02 MED ORDER — HYDROMORPHONE HCL 1 MG/ML IJ SOLN
1.0000 mg | INTRAMUSCULAR | Status: DC | PRN
Start: 2014-07-02 — End: 2014-07-06

## 2014-07-02 MED ORDER — ASPIRIN 325 MG PO TBEC
325.0000 mg | DELAYED_RELEASE_TABLET | Freq: Every day | ORAL | Status: AC
Start: 2014-07-02 — End: 2014-08-01

## 2014-07-02 MED ORDER — SODIUM CHLORIDE 0.9 % IV MBP
1000.0000 mg | INTRAVENOUS | Status: DC
Start: 2014-07-02 — End: 2014-07-04
  Administered 2014-07-02 – 2014-07-03 (×2): 1000 mg via INTRAVENOUS
  Filled 2014-07-02 (×3): qty 1000

## 2014-07-02 MED ORDER — HYDROMORPHONE HCL 1 MG/ML IJ SOLN
0.5000 mg | INTRAMUSCULAR | Status: DC | PRN
Start: 2014-07-02 — End: 2014-07-06

## 2014-07-02 MED ORDER — DOCUSATE SODIUM 100 MG PO CAPS
100.0000 mg | ORAL_CAPSULE | Freq: Two times a day (BID) | ORAL | Status: AC
Start: 2014-07-02 — End: 2014-07-17

## 2014-07-02 MED ORDER — CELECOXIB 200 MG PO CAPS
200.0000 mg | ORAL_CAPSULE | Freq: Every day | ORAL | Status: DC
Start: 2014-07-02 — End: 2014-07-13

## 2014-07-02 MED ORDER — ONDANSETRON HCL 4 MG PO TABS
4.0000 mg | ORAL_TABLET | Freq: Four times a day (QID) | ORAL | Status: AC | PRN
Start: 2014-07-02 — End: ?

## 2014-07-02 MED ORDER — IRON-VITAMIN C 65-125 MG PO TABS
1.0000 | ORAL_TABLET | Freq: Every day | ORAL | Status: AC
Start: 2014-07-02 — End: 2014-08-01

## 2014-07-02 MED ORDER — TRAMADOL HCL 50 MG PO TABS
50.0000 mg | ORAL_TABLET | Freq: Four times a day (QID) | ORAL | Status: DC | PRN
Start: 2014-07-02 — End: 2018-01-24

## 2014-07-02 NOTE — Plan of Care (Signed)
Problem: Impaired Mobility  Goal: Mobility/activity is maintained at optimum level for patient  Outcome: Progressing  Patient OOB to chair with PT.    Problem: Inadequate Gas Exchange  Goal: Adequately oxygenating and ventilation is improved  Outcome: Progressing    Problem: Day 1 Post-op- Knee Surgery  Goal: Hemodynamic Stability  Outcome: Progressing  Patient awake, alert and oriented x3 this morning. VSS 02 sat 100% on RA.   Goal: Pain at adequate level as identified by patient  Outcome: Progressing  Medicated with 2 norco prior PT, pain level 2/10 at the moment.  Goal: Stable Neurovascular Status  Outcome: Progressing  Neurovascular status WNL, +sensation, +plantar dorsiflexion. IROM brace in place.  Goal: Free from Infection  Outcome: Progressing  Goal: Patient will maintain normal GI status (Post Op Day 1-Knee Surgery)  Outcome: Progressing  NO c/o nausea/vomiting. Abdomen soft, +BS.

## 2014-07-02 NOTE — Anesthesia Postprocedure Evaluation (Addendum)
Anesthesia Post Evaluation    Patient: [name]    Procedures performed: Procedure(s):  ARTHROPLASTY, KNEE, REVISION TOTAL    Anesthesia type: Intrathecal catheter, continuous spinal anesthesia    Patient location:[facility] Floor    Last vitals:   Filed Vitals:    07/02/14 0905   BP: 144/67   Pulse: 64   Temp:    Resp:    SpO2: 100%       Post pain: Patient not complaining of pain, continue current therapy      Mental Status:awake and alert     Respiratory Function: tolerating room air    Cardiovascular: stable    Nausea/Vomiting: patient not complaining of nausea or vomiting    Hydration Status: adequate    Post assessment: no apparent anesthetic complications, no reportable events and no evidence of recall

## 2014-07-02 NOTE — Progress Notes (Signed)
PROGRESS NOTE    Date Time: 07/02/2014 6:28 AM  Patient Name: [name], [name]      Assessment:    s/p R revision reimplantation TKA on  07/01/2014 doing well    Plan:   Continue PT - 50% WB RLE in HKB locked in extension at all times    Continue anticoagulation - ASA/foot pumps    High risk wound: nutrition support and MVI+Vit C ordered    Abx: IV vanc/ceftriaxone pending cultures and ID consult, patient to go home on po v iv abx    D/c planning - home when clears PT and abx dispo determined    Subjective:   Pain controlled, tolerating diet    Medications:     Current Facility-Administered Medications   Medication Dose Route Frequency   . amLODIPine  5 mg Oral Daily   . aspirin EC  325 mg Oral Daily   . bisoprolol  5 mg Oral Daily   . cefTRIAXone  1 g Intravenous Q24H   . cloNIDine  0.1 mg Oral Daily   . gabapentin  300 mg Oral Daily   . ketorolac  15 mg Intravenous Q6H   . multivitamin  1 tablet Oral Daily   . potassium chloride  20 mEq Oral BID   . pregabalin  75 mg Oral BID   . sodium chloride (PF)  3 mL Intravenous Q8H   . tiotropium  18 mcg Inhalation QAM   . traMADol  50 mg Oral 4 times per day   . vancomycin  1,500 mg Intravenous Q12H   . vitamin C  500 mg Oral BID       Physical Exam:     Filed Vitals:    07/02/14 0524   BP: 148/64   Pulse: 61   Temp: 97 F (36.1 C)   Resp: 18   SpO2: 100%       Intake and Output Summary (Last 24 hours) at Date Time    Intake/Output Summary (Last 24 hours) at 07/02/14 1610  Last data filed at 07/02/14 0524   Gross per 24 hour   Intake   1680 ml   Output   1530 ml   Net    150 ml       General appearance - no acute distress  Musculoskeletal -  Dressing C/D/I  Fires quad/TA/EHL/GSC  SILT SP/DP/TN  WWP distally  Calves soft, nontender bilateral    Labs:     Results     Procedure Component Value Units Date/Time    Basic metabolic panel [[id number]]  (Abnormal) Collected:  07/02/14 0456    Specimen Information:  Blood Updated:  07/02/14 0553     Glucose 120 (H) mg/dL      BUN 14  mg/dL      Creatinine 0.7 mg/dL      CALCIUM 8.8 mg/dL      Sodium 119 mEq/L      Potassium 3.9 mEq/L      Chloride 108 mEq/L      CO2 26 mEq/L     GFR [[id number]] Collected:  07/02/14 0456     EGFR >60.0 Updated:  07/02/14 0553    Hemoglobin and hematocrit, blood [[id number]]  (Abnormal) Collected:  07/02/14 0456    Specimen Information:  Blood Updated:  07/02/14 0535     Hgb 10.6 (L) g/dL      Hematocrit 69.6 (L) %     Wound culture & gram stain [[id number]] Collected:  07/01/14 1600  Specimen Information:  Wound / Tissue Updated:  07/01/14 2135    Narrative:      ORDER#: [id number]                                    ORDERED BY: Ahmed Prima, NITIN  SOURCE: Tissue Right Knee Tissue #1                  COLLECTED:  07/01/14 16:00  ANTIBIOTICS AT COLL.:                                RECEIVED :  07/01/14 18:21  Stain, Gram                                FINAL       07/01/14 21:35  07/01/14   No WBCs or organisms seen             No Squamous epithelial cells seen  Culture and Gram Stain, Aerobic, Wound     PENDING      Wound culture & gram stain [[id number]] Collected:  07/01/14 1600    Specimen Information:  Wound / Tissue Updated:  07/01/14 2131    Narrative:      ORDER#: [id number]                                    ORDERED BY: Ahmed Prima, NITIN  SOURCE: Tissue Right Knee Tissue #3                  COLLECTED:  07/01/14 16:00  ANTIBIOTICS AT COLL.:                                RECEIVED :  07/01/14 18:26  Stain, Gram                                FINAL       07/01/14 21:31  07/01/14   Few WBCs             No organisms seen             No Squamous epithelial cells seen  Culture and Gram Stain, Aerobic, Wound     PENDING      Wound culture & gram stain [[id number]] Collected:  07/01/14 1600    Specimen Information:  Wound / Tissue Updated:  07/01/14 2127    Narrative:      ORDER#: [id number]                                    ORDERED BY: Ahmed Prima, NITIN  SOURCE: Tissue Right Knee Tissue #2                  COLLECTED:  07/01/14  16:00  ANTIBIOTICS AT COLL.:                                RECEIVED :  07/01/14 18:27  Stain, Gram  FINAL       07/01/14 21:27  07/01/14   No WBCs or organisms seen             No Squamous epithelial cells seen  Culture and Gram Stain, Aerobic, Wound     PENDING      Anaerobic culture [[id number]] Collected:  07/01/14 1600    Specimen Information:  Other / Wound Updated:  07/01/14 1827    Anaerobic culture [[id number]] Collected:  07/01/14 1600    Specimen Information:  Other / Wound Updated:  07/01/14 1826    Anaerobic culture [[id number]] Collected:  07/01/14 1600    Specimen Information:  Other / Wound Updated:  07/01/14 1822    RBC OR HOLD Blood Product [[id number]] Collected:  07/01/14 1200     RBC Leukoreduced [id number]    selected Updated:  07/01/14 1311    Narrative:      ?Special Requirements->No special requirements  ?Surgery/ Procedure (to be Performed)->Right knee revision  ?Surgery/Procedure Date:->07/01/14  ?Is the patient pregnant?->No  ?Has the patient been transfused w/i the last 3 months?->No    Type and Screen [[id number]] Collected:  07/01/14 1200    Specimen Information:  Blood Updated:  07/01/14 1310     ABO Rh B POS      AB Screen Gel NEG     Narrative:      ?Special Requirements->No special requirements  ?Surgery/ Procedure (to be Performed)->Right knee revision  ?Surgery/Procedure Date:->07/01/14  ?Is the patient pregnant?->No  ?Has the patient been transfused w/i the last 3 months?->No          Physical Therapy:   Pain Score: 5-moderate pain (07/02/14 0612)              Rads:   Radiological Procedure reviewed.    Signed by: Collene Schlichter III

## 2014-07-02 NOTE — Plan of Care (Signed)
Problem: Learning/Teaching Needs - Hypertension  Goal: Blood pressures within parameters, no seizure activity, headaches or changes in visual fields.  Outcome: Progressing  Goal: Assess/monitor vital signs (orthostatic vital signs if applicable), I&O, urine color, labs, skin turgor, mucous membranes, jugular venous distention, edema, circumference of edematous extremities and abdominal girth, respiratory status, and mental status  Outcome: Progressing  Goal: Knowledge - diet  Patient will verbalize understanding of proper diet.  Outcome: Progressing

## 2014-07-02 NOTE — Plan of Care (Signed)
Problem: Day of Surgery- Knee Surgery  Goal: Hemodynamic Stability  Outcome: Progressing  Pt has acewrap dressing and IROM brace in place. Hemovac in place. Will continue to monitor.      Goal: Stable Neurovascular Status  Outcome: Progressing  VS stable this am. Will continue to monitor.      Goal: Free from Infection  Outcome: Progressing  Goal: Mobility/activity is maintained at optimum level for patient  Outcome: Progressing

## 2014-07-02 NOTE — Plan of Care (Signed)
Problem: Day 1 Post-op- Knee Surgery  Goal: Free from Infection  Intervention: Document time and amount of first void following removal of foley catheter.  Foley in place draining cloudy urine with some sediments to be 'ed on 9/15.

## 2014-07-02 NOTE — Addendum Note (Signed)
Addendum  created 07/02/14 1013 by Camillo Flaming, MD    Modules edited: Anesthesia Blocks and Procedures, Clinical Notes, Fast Note, Notes Section    Clinical Notes:  File: [id number]; File: [id number]    Fast Note:  File: [id number]    Notes Section:  File: [id number]; File: [id number]

## 2014-07-02 NOTE — Progress Notes (Signed)
[facility]IC[facility] PROGRESS NOTE    Date Time: 07/02/2014 9:05 AM  Patient Name: [name], [name]        Subjective:   "I am feeling okay.  My nose is stuffy."    Medications:      Scheduled Meds: PRN Meds:      amLODIPine 5 mg Oral Daily   aspirin EC 325 mg Oral Daily   bisoprolol 5 mg Oral Daily   cefTRIAXone 1 g Intravenous Q24H   cloNIDine 0.1 mg Oral Daily   gabapentin 300 mg Oral Daily   ketorolac 15 mg Intravenous Q6H   multivitamin 1 tablet Oral Daily   potassium chloride 20 mEq Oral BID   pregabalin 75 mg Oral BID   sodium chloride (PF) 3 mL Intravenous Q8H   tiotropium 18 mcg Inhalation QAM   traMADol 50 mg Oral 4 times per day   vancomycin 1,500 mg Intravenous Q12H   vitamin C 500 mg Oral BID         Continuous Infusions:  . sodium chloride 100 mL/hr at 07/01/14 2100   . lactated ringers        HYDROcodone-acetaminophen 1 tablet Q4H PRN   HYDROcodone-acetaminophen 2 tablet Q4H PRN   HYDROmorphone 0.5 mg Q1H PRN   HYDROmorphone 1 mg Q1H PRN   hydrOXYzine 50 mg Q12H PRN   loratadine 10 mg PRN   naloxone 0.1 mg PRN   ondansetron 4 mg Q8H PRN   senna-docusate 2 tablet BID PRN         I personally reviewed all of the medications      Review of Systems:   A comprehensive review of systems was obtained from chart review and the patient  General ROS: negative  Ophthalmic ROS: negative  Endocrine ROS: negative  Respiratory ROS: no cough, shortness of breath, or wheezing  Cardiovascular ROS: no chest pain or dyspnea on exertion  Gastrointestinal ROS: no abdominal pain, denies nausea or vomiting  Genito-Urinary ROS: no c/o -currently with Foley catheter  Musculoskeletal ROS: Right knee pain -angina medications  Neurological ROS: no TIA or stroke symptoms, denies dizziness currently      Physical Exam:     Filed Vitals:    07/01/14 2122 07/01/14 2334 07/02/14 0432 07/02/14 0524   BP: 179/77 176/70 176/76 148/64   Pulse: 67 68 62 61   Temp:  97.2 F (36.2 C)  97 F (36.1 C)   TempSrc:       Resp: 18 18 18 18     Height:       Weight:       SpO2: 100% 100% 100% 100%         Intake and Output Summary (Last 24 hours) at Date Time    Intake/Output Summary (Last 24 hours) at 07/02/14 8413  Last data filed at 07/02/14 0524   Gross per 24 hour   Intake   1680 ml   Output   1530 ml   Net    150 ml       General appearance - alert, well appearing, and in no distress  Mental status - alert, oriented to person, place, and time  Chest - clear to auscultation, no wheezes, rales or rhonchi, symmetric air entry  Heart - normal rate, regular rhythm, normal S1, S2, no murmurs  Abdomen - bowel sounds normal, abdomen is soft, nontender, non distended, obese  Neurological - alert, oriented, normal speech, no focal findings or movement disorder noted  Musculoskeletal - S/P  therapy, sitting in chair  Extremities - peripheral pulses normal, no pedal edema, no clubbing or cyanosis    Consultant note reviewed.    Labs:       Recent Labs  Lab 07/02/14  0456   GLUCOSE 120*   BUN 14   CREATININE 0.7   CALCIUM 8.8   SODIUM 142   POTASSIUM 3.9   CHLORIDE 108   CO2 26   EGFR >60.0       No results for input(s): AST, ALT, ALKPHOS, ALB, BILITOTAL, AMY, LIP in the last 168 hours.      Recent Labs  Lab 07/02/14  0456   HEMOGLOBIN 10.6*   HEMATOCRIT 34.9*       No results for input(s): TSH, FREET3 in the last 168 hours.    Invalid input(s): FREET4        Rads:     Radiology Results (24 Hour)     Procedure Component Value Units Date/Time    X-ray knee right AP and lateral [[id number]] Collected:  07/02/14 0858    Order Status:  Completed Updated:  07/02/14 0903    Narrative:      CLINICAL INDICATION: Arthroplasty, hardware evaluation. The TKA  revision, longstem    FINDINGS: AP and lateral views of the knee were obtained. The joint  prothesis an intramedullary components are in good position. Is in good  position. There is no fracture or dislocation. Postsurgical changes are  seen in the soft tissues. There is a fracture line through the distal  femur which  is well corticated consistent with old injury. This is just  superior to the femoral anterior prosthesis component.      Impression:        Good position of the joint prothesis. No acute fracture or  dislocation.    Kinnie Feil, MD   07/02/2014 8:59 AM                    Assessment and Plan:      Patient Active Problem List   Diagnosis   . Total knee replacement status .  Hx of Chronic infection of prosthetic knee.  S/P right TKA revision reimplantation 07/01/14 -continue plan per Ortho/ID  Deep vein thrombosis prophylaxis as per Ortho    . Low back pain   . Chronic obstructive pulmonary disease- no complaints of shortness of breath, currently stable    . Hypertension -blood pressure stable.  Continue medications.     Marland Kitchen Hx Non-healing surgical wound- right knee, poor wound healing-  On juven, Vit C and ensure.   . Acute Blood Loss Anemia, s/p surgery- will add Iron    ESBL, MRSA -on isolation  Post nasal drip -patient takes Claritin as needed preoperatively.  Son will bring Claritin from home to replace Zyrtec while inpatient.  Will order saline nasal spray.    Signed by: Sheryle Hail, NP

## 2014-07-02 NOTE — Progress Notes (Signed)
Irom applied in OR post-op

## 2014-07-02 NOTE — PT Eval Note (Signed)
[facility]  [address]  [place] [address]  [phone]    Physical Therapy Evaluation    Patient: [name] MRN: [id number]   Unit: [facility] 4C Bed: C623/J628.31    Time of Treatment: Time Calculation  PT Received On: 07/02/14  Start Time: 1005  Stop Time: 1053  Time Calculation (min): 48 min    Consult received for [name] for PT evaluation and treatment.  Patient's medical condition is appropriate for Physical Therapy  intervention at this time.    Precautions  Weight Bearing Status: RLE partial weight bearing  Weight Bearing Percent: 50  Total Knee Replacement: hinge brace (L[place]y bulky dressing, locked in extension)  Precaution Instructions Given to Patient: Yes  MD orders for Exercises: Active and passive exercise.      History of Present Illness: [name] is a 78 y.o. female admitted on 07/01/2014   Medical Diagnosis: Infection and inflammatory reaction due to internal joint prosthesis, initial encounter [996.66, V43.60]  Surgical Diagnosis: Right TKRevison (reimplantation) Procedure(s):  ARTHROPLASTY, KNEE, REVISION TOTAL performed by  Lane Hacker, MD on 07/01/2014.        Patient Active Problem List   Diagnosis   . Total knee replacement status   . Low back pain   . Chronic obstructive pulmonary disease   . Hypertension   . Debility following multiple R knee revisions   . Non-healing surgical wound- right knee    . Fever   . Leukocytosis   . Anemia   . Hypokalemia   . Chronic infection of prosthetic knee   . COPD (chronic obstructive pulmonary disease)   . HTN (hypertension)   . Infection   . Osteoarthritis       Past Medical History   Diagnosis Date   . Hypertension    . Pneumonia    . Chronic obstructive pulmonary disease    . Headache(784.0)      not since surgery in 1957   . Arthritis    . Difficulty in walking(719.7)    . Low back pain    . Urinary tract infection    . Depression        Past Surgical History   Procedure Laterality Date    . Cervical spine surgery     . Lumbar spine surgery  posterior w/ hardware   . Tonsillectomy     . Brain surgery  R side/ 1957 Optic nerve pressure   . Eye surgery       cataracts   . Colonoscopy     . Tubal ligation  1961   . Arthroplasty, knee, total  08/25/2013     Procedure: ARTHROPLASTY, KNEE, TOTAL;  Surgeon: Lane Hacker, MD;  Location: [facility];  Service: Orthopedics;  Laterality: Right;   . Arthroplasty, knee, revision total  09/05/2013     Procedure: ARTHROPLASTY, KNEE, REVISION TOTAL;  Surgeon: Lane Hacker, MD;  Location: [facility];  Service: Orthopedics;  Laterality: Right;  RT KNEE WASHOUT & POLY EXCHANGE **POA/CH** (STRYKER)    . Arthroplasty, knee, resection  09/15/2013     Procedure: ARTHROPLASTY, KNEE, RESECTION;  Surgeon: Lane Hacker, MD;  Location: [facility];  Service: Orthopedics;  Laterality: Right;  AND PLACEMENT OF ANTIBIOTIC SPACER  Placement of wound vac   . Arthroplasty, knee, revision total  09/22/2013     Procedure: ARTHROPLASTY, KNEE, REVISION TOTAL;  Surgeon: Lane Hacker, MD;  Location: [facility];  Service: Orthopedics;  Laterality: Right;   . Debridement, muscle flap closure  09/22/2013     Procedure: DEBRIDEMENT, MUSCLE FLAP CLOSURE;  Surgeon: Ernie Avena, MD;  Location: [facility];  Service: Plastics;  Laterality: Right;  DEBRIDEMENT & MUSCLE FLAP KNEE RT   . Graft, skin  split thickness, (medical)  09/22/2013     Procedure: GRAFT, SKIN  SPLIT THICKNESS, (MEDICAL);  Surgeon: Ernie Avena, MD;  Location: [facility];  Service: Plastics;  Laterality: N/A;   . Echocardiogram, transesophageal N/A 03/26/2014     Procedure: ECHOCARDIOGRAM, TRANSESOPHAGEAL;  Surgeon: Marni Griffon, MD;  Location: [facility];  Service: Cardiology;  Laterality: N/A;   . Arthroplasty, knee, revision total Right 07/01/2014     Procedure: ARTHROPLASTY, KNEE, REVISION TOTAL;  Surgeon: Lane Hacker, MD;  Location: [facility];  Service: Orthopedics;   Laterality: Right;       Social History:  Prior Level of Function  Prior level of function: Independent with ADLs;Ambulates with assistive device (sponge bathing)  Assistive Device: Front wheel walker  Baseline Activity Level: Household ambulation (only in bathroom, 5-8 feet)  DME Currently at Home: Front wheel walker;Single point cane;3-in-1 [facility]    Home Living Arrangements  Living Arrangements: Children  Type of Home: House  Home Layout: Two level;Stairs to enter with rails (add number in comment) (3 STE, stays on first floor in hospital bed)  Bathroom Shower/Tub: Walk-in shower (on second floor)  Bathroom Toilet: Midwife: Commode;Shower Patent examiner Accessibility: Accessible via walker (wheels in W/C to door and walks in with walker)  DME Currently at Home: Front wheel walker;Single point cane;3-in-1 [facility]    ASSESSMENT     Patient moving well after surgery.   Able to go bed to W/C with min assist and verbal cues.  Progress to tolerance.      [name] is a 78 y.o. female admitted 07/01/2014. Pt presents with  Right TKRevision  Procedure(s):  ARTHROPLASTY, KNEE, REVISION TOTAL. Patient present with deficits in strength, function, ambulation and ADLS.  Pt would benefit from skilled Physical Therapy Services.  Assessment  Assessment: Decreased LE ROM;Decreased LE strength;Decreased functional mobility;Gait impairment;Decreased endurance/activity tolerance  Prognosis: Good;With continued PT status post acute discharge  Progress: Progressing toward goals     Goals  Goal Formulation: With patient/family  Time for Goal Acheivement: 10 visits  Goals: Select goal  Pt Will Perform Sit to Stand: with stand by assist  Pt Will Transfer to Toilet: with stand by assist  Pt Will Ambulate: 31-50 feet, with stand by assist  Pt Will Go Up / Down Stairs: 3-5 stairs, with contact guard assist  Pt Will Perform Home Exer Program: with minimal assist  Pt Will Propel Wheelchair: 51-150 feet, modified  independent  Pt Will Perform LE Dressing w/Device: with minimal assist    Rehabilitation Potential  Prognosis: Good;With continued PT status post acute discharge      PLAN       Plan  Risks/Benefits/POC Discussed with Pt/Family: With patient/family  Patient Goal: walk, drive  Treatment/Interventions: Exercise;Gait training;Functional transfer training;LE strengthening/ROM;Bed mobility;Equipment eval/education  PT Frequency: twice a day    Recommendation  Discharge Recommendation: Home with home health PT  PT - Next Visit Recommendation: 07/02/14  PT Frequency: twice a day            SUBJECTIVE      Patient is agreeable to participation in the therapy  session. Family and/or guardian are agreeable to patient's participation in the therapy session.  Patient Goal  Patient Goal: walk, drive      Pain Assessment  Pain Assessment: Numeric Scale (0-10)  Pain Score: 2-mild pain  Patient's Stated Comfort Functional Goal: 4-moderate pain  Pain Intervention(s): Cold applied;Repositioned;Ambulation/increased activity            OBJECTIVE     Patient is in bed with IV, foley, dressings, AVIs and hemovac, seen 1 Day Post-Op.    Observation of patient:     Cognition  Arousal/Alertness: Appropriate responses to stimuli  Following Commands: Follows all commands and directions without difficulty  Safety Awareness: independent  Insights: Fully aware of deficits  Problem Solving: Able to problem solve independently       Vitals: Supine with head elevated to max  120/80"s, HR 60-70's  Sitting BP 120/80's, HR 60-70's stable and without symptoms      Musculoskeletal Examination  Gross ROM  Right Lower Extremity ROM: within functional limits (knee in IROM locked in extension)  Left Lower Extremity ROM: within functional limits                                  Sensation is intact , to light touch, bilateral lower extremities    Gross Strength  Right Lower Extremity Strength:  (DF 5/5, knee NT, hip flex 3-/5, abduct 2+/5)  Left Lower  Extremity Strength: within functional limits                  Functional Mobility  Supine to Sit: Contact Guard Assist (HOB to Max position)  Scooting to EOB: Stand by Assist  Sit to Stand: Minimal Assist (height of bed elevated)  Stand to Sit: Minimal Assist    Transfers  Bed to Chair: Minimal Assist  Device Used for Functional Transfer: front-wheeled walker       Locomotion  Ambulation: Minimal Assist;with front-wheeled walker  Ambulation Distance (Feet): 3 Feet  Pattern: decreased cadence;decreased step length  Weight Bearing Observed: Able to maintain weight bearing precautions    Pt did not perform self care activities at this time.    Therapeutic Exercises:  Straight Leg Raise: 10aa  Quad Sets: 10  Glute Sets: 10  Hip Abduction: 10aa  Ankle Pumps: 10      Participation and Endurance  Participation Effort: good  Endurance: Tolerates 30 min exercise with multiple rests  [place] Dyspnea Scale: 1+ Dyspnea        Educated the patient to role of physical therapy, plan of care, goals  of therapy and Physical Therapy  Precautions  Adaptive Equipment  Weight Bearing  Gait Techniques  Transfer Techniques  Leg Exercises  Range of Motion Exercises.    Patient continues to need focused education on Weight Bearing  Gait Techniques  Transfer Techniques  Leg Exercises  Range of Motion Exercises.    Patient is seated in wheelchair in room with son present with all needs provided and call bell within reach. RN notified of session outcome.            Signature: Howie Ill, PT

## 2014-07-02 NOTE — PT Progress Note (Signed)
Physical Therapy Note    Physical Therapy Treatment    Patient:  [name]        MRN#:  [id number]  Unit:  [facility] 4C        Room/Bed:  M447/M447.01    Time of treatment: Start Time: 1630 Stop Time: 1715  Time Calculation (min): 45 min  PT Received On: 07/02/14    Treatment #: PT Visit Number: 1/10    Precautions  Weight Bearing Status: RLE partial weight bearing  Weight Bearing Percent: 50  Total Knee Replacement: hinge brace (locked in extension)  Precaution Instructions Given to Patient: Yes  MD orders for Exercises: AROM, no R knee flexion    Medical Diagnosis:   Infection and inflammatory reaction due to internal joint prosthesis, initial encounter [782.95, V43.60]  Surgical Procedure:Right  Procedure(s):  ARTHROPLASTY, KNEE, REVISION TOTAL performed by  Lane Hacker, MD on 07/01/2014.    Patient's medical condition is appropriate for Physical Therapy  intervention at this time.  Patient cleared by nurse to participate in PT session, nursing reports pt up in chair.    ASSESSMENT   Pt progressing with mobility, able to maintain PWB with all mobility with initial cues.  Pt motivated to improve.    Goals per Eval/Re-eval:     Goals  Goal Formulation: With patient/family  Time for Goal Acheivement: 10 visits  Goals: Select goal  Pt Will Perform Sit to Stand: with stand by assist  Pt Will Transfer to Toilet: with stand by assist  Pt Will Ambulate: 31-50 feet, with stand by assist  Pt Will Go Up / Down Stairs: 3-5 stairs, with contact guard assist  Pt Will Perform Home Exer Program: with minimal assist  Pt Will Propel Wheelchair: 51-150 feet, modified independent  Pt Will Perform LE Dressing w/Device: with minimal assist    PLAN     Recommendation  Discharge Recommendation: Home with home health PT  PT - Next Visit Recommendation: 07/03/14  PT Frequency: 7x/wk    Continue plan of care.    SUBJECTIVE   "I've been up in the chair since the last session, I feel good."  Patient is  agreeable to participation in the therapy session. Nursing clears patient for therapy.  Patient Goal: walk, drive  Pain Assessment  Pain Assessment: Numeric Scale (0-10)  Pain Score: 2-mild pain  Pain Location: Knee  Pain Orientation: Right;Anterior  Pain Descriptors: Sore  Patient's Stated Comfort Functional Goal: 4-moderate pain  Pain Intervention(s): Repositioned          OBJECTIVE     Patient is seated in a bedside chair seen 1 Day Post-Op with foley, hemovac and hinged knee brace locked in extension.    Gross ROM  Right Lower Extremity ROM: within functional limits (knee in IROM locked in extension)  Left Lower Extremity ROM: within functional limits                                  Sensation is intact     Cognition  Arousal/Alertness: Appropriate responses to stimuli  Following Commands: Follows all commands and directions without difficulty  Safety Awareness: independent  Insights: Fully aware of deficits  Problem Solving: Able to problem solve independently    Functional Mobility  Supine to Sit: Contact Guard Assist (HOB to Max position)  Scooting to EOB: Stand by Assist  Sit to Supine: Contact Guard Assist  Sit  to Stand: Minimal Assist  Stand to Sit: Minimal Assist    Transfers  Bed to Chair: Minimal Assist  Device Used for Functional Transfer: front-wheeled walker    Locomotion  Ambulation: Minimal Assist;with front-wheeled walker  Ambulation Distance (Feet): 20 Feet  Pattern: R decreased stance time;decreased step length;decreased cadence;Step to  Weight Bearing Observed: PWB;During transfers;During Standing;During ambulation;Required instruction to maintain weight bearing status (with cues)    Therapeutic Exercise  Straight Leg Raise: 10AA  Quad Sets: 25  Glute Sets: 25  Hip Abduction: 25  Ankle Pumps: 25       Self Care and Home Management:  Pt did not perform self care activities at this time.    Educated the patient to role of physical therapy, plan of care, goals  of therapy and Physical  Therapy  Precautions  Adaptive Equipment  Weight Bearing  Gait Techniques  Transfer Techniques  Leg Exercises  Range of Motion Exercises.    Patient continues to need focused education on Weight Bearing  Gait Techniques  Transfer Techniques  Leg Exercises  Stair negotiation.    Patient is in bed with all needs provided and call bell within reach. RN notified of session outcome, including all needs in reach, AVI's in place, vitals WNL.        Signature: Gar Gibbon, PTA 07/02/2014 6:00 PM

## 2014-07-02 NOTE — Progress Notes (Signed)
Infectious diseases    Pt well known to me -   Full note to follow     Would suggest invanz 1 gram iv q 24 hrs and vanco   Pending further cx results.    Check vanco level         Tillie Rung MD

## 2014-07-03 LAB — COMPREHENSIVE METABOLIC PANEL
ALT: 11 U/L (ref 0–55)
AST (SGOT): 14 U/L (ref 5–34)
Albumin/Globulin Ratio: 0.8 — ABNORMAL LOW (ref 0.9–2.2)
Albumin: 2.8 g/dL — ABNORMAL LOW (ref 3.5–5.0)
Alkaline Phosphatase: 54 U/L (ref 37–106)
BUN: 20 mg/dL — ABNORMAL HIGH (ref 7–19)
Bilirubin, Total: 0.3 mg/dL (ref 0.2–1.2)
CO2: 24 mEq/L (ref 22–29)
Calcium: 8.4 mg/dL (ref 7.9–10.2)
Chloride: 108 mEq/L (ref 100–111)
Creatinine: 0.8 mg/dL (ref 0.6–1.0)
Globulin: 3.6 g/dL (ref 2.0–3.6)
Glucose: 135 mg/dL — ABNORMAL HIGH (ref 70–100)
Potassium: 3.8 mEq/L (ref 3.5–5.1)
Protein, Total: 6.4 g/dL (ref 6.0–8.3)
Sodium: 142 mEq/L (ref 136–145)

## 2014-07-03 LAB — CBC WITH MANUAL DIFFERENTIAL
Band Neutrophils Absolute: 0.17 10*3/uL (ref 0.00–1.00)
Band Neutrophils: 2 %
Basophils Absolute Manual: 0 10*3/uL (ref 0.00–0.20)
Basophils Manual: 0 %
Cell Morphology: NORMAL
Eosinophils Absolute Manual: 0.09 10*3/uL (ref 0.00–0.70)
Eosinophils Manual: 1 %
Hematocrit: 30.4 % — ABNORMAL LOW (ref 37.0–47.0)
Hgb: 9.4 g/dL — ABNORMAL LOW (ref 12.0–16.0)
Lymphocytes Absolute Manual: 1.2 10*3/uL (ref 0.50–4.40)
Lymphocytes Manual: 14 %
MCH: 26.3 pg — ABNORMAL LOW (ref 28.0–32.0)
MCHC: 30.9 g/dL — ABNORMAL LOW (ref 32.0–36.0)
MCV: 85.2 fL (ref 80.0–100.0)
MPV: 10.6 fL (ref 9.4–12.3)
Monocytes Absolute: 0.26 10*3/uL (ref 0.00–1.20)
Monocytes Manual: 3 %
Neutrophils Absolute Manual: 6.83 10*3/uL (ref 1.80–8.10)
Nucleated RBC: 0 /100 WBC (ref 0–1)
Platelet Estimate: ADEQUATE
Platelets: 174 10*3/uL (ref 140–400)
RBC: 3.57 10*6/uL — ABNORMAL LOW (ref 4.20–5.40)
RDW: 15 % (ref 12–15)
Segmented Neutrophils: 80 %
WBC: 8.54 10*3/uL (ref 3.50–10.80)

## 2014-07-03 LAB — VANCOMYCIN, RANDOM: Vancomycin Random: 16.5 ug/mL

## 2014-07-03 LAB — C-REACTIVE PROTEIN: C-Reactive Protein: 6.5 mg/dL — ABNORMAL HIGH (ref 0.0–0.5)

## 2014-07-03 LAB — SEDIMENTATION RATE: Sed Rate: 53 mm/Hr — ABNORMAL HIGH (ref 0–20)

## 2014-07-03 LAB — GFR: EGFR: >60

## 2014-07-03 LAB — VANCOMYCIN, TROUGH: Vancomycin Trough: 24.5 ug/mL — ABNORMAL HIGH (ref 10.0–20.0)

## 2014-07-03 MED ORDER — METOCLOPRAMIDE HCL 5 MG/ML IJ SOLN
10.0000 mg | Freq: Four times a day (QID) | INTRAMUSCULAR | Status: DC | PRN
Start: 2014-07-03 — End: 2014-07-03
  Administered 2014-07-03: 10:00:00 10 mg via INTRAVENOUS
  Filled 2014-07-03: qty 2

## 2014-07-03 MED ORDER — BISACODYL 10 MG RE SUPP
10.0000 mg | Freq: Once | RECTAL | Status: AC
Start: 2014-07-03 — End: 2014-07-03
  Administered 2014-07-03: 10 mg via RECTAL
  Filled 2014-07-03: qty 1

## 2014-07-03 MED ORDER — METRONIDAZOLE 250 MG PO TABS
250.0000 mg | ORAL_TABLET | Freq: Two times a day (BID) | ORAL | Status: DC
Start: 2014-07-03 — End: 2014-07-03

## 2014-07-03 MED ORDER — MUPIROCIN CALCIUM 2 % NA OINT
TOPICAL_OINTMENT | Freq: Two times a day (BID) | NASAL | Status: DC
Start: 2014-07-03 — End: 2014-07-06
  Administered 2014-07-03 – 2014-07-06 (×5): 1 via NASAL
  Filled 2014-07-03 (×6): qty 1

## 2014-07-03 MED ORDER — SODIUM CHLORIDE 0.9 % IV SOLN
100.0000 mL/h | INTRAVENOUS | Status: DC
Start: 2014-07-03 — End: 2014-07-03

## 2014-07-03 MED ORDER — DEXTROSE-SODIUM CHLORIDE 5-0.9 % IV SOLN
INTRAVENOUS | Status: DC
Start: 2014-07-03 — End: 2014-07-06

## 2014-07-03 MED ORDER — METOCLOPRAMIDE HCL 5 MG/ML IJ SOLN
10.0000 mg | Freq: Four times a day (QID) | INTRAMUSCULAR | Status: AC
Start: 2014-07-03 — End: 2014-07-03
  Administered 2014-07-03 (×2): 10 mg via INTRAVENOUS
  Filled 2014-07-03 (×2): qty 2

## 2014-07-03 NOTE — Progress Notes (Signed)
[facility] PRO[facility]ESS NOTE    Date Time: 07/03/2014 8:24 AM  Patient Name: [name],[name]        Subjective:   "I am feeling nauseous today."  Patient vomited once before breakfast.    Medications:      Scheduled Meds: PRN Meds:        amLODIPine 5 mg Oral Daily   aspirin EC 325 mg Oral Daily   bisoprolol 5 mg Oral Daily   cloNIDine 0.1 mg Oral Daily   ertapenem 1,000 mg Intravenous Q24H   Ferrous Sulfate 324 mg Oral QAM W/BREAKFAST   gabapentin 300 mg Oral Daily   multivitamin 1 tablet Oral Daily   potassium chloride 20 mEq Oral BID   pregabalin 75 mg Oral BID   saline 2 spray Each Nare BID   sodium chloride (PF) 3 mL Intravenous Q8H   tiotropium 18 mcg Inhalation QAM   traMADol 50 mg Oral 4 times per day   vitamin C 500 mg Oral BID         Continuous Infusions:  . sodium chloride 100 mL/hr at 07/01/14 2100   . lactated ringers        cetirizine 10 mg QD PRN   HYDROcodone-acetaminophen 1 tablet Q4H PRN   HYDROcodone-acetaminophen 2 tablet Q4H PRN   HYDROmorphone 0.5 mg Q1H PRN   HYDROmorphone 1 mg Q1H PRN   hydrOXYzine 50 mg Q12H PRN   naloxone 0.1 mg PRN   ondansetron 4 mg Q8H PRN   senna-docusate 2 tablet BID PRN         I personally reviewed all of the medications      Review of Systems:   A comprehensive review of systems was obtained from chart review and the patient  General ROS: negative  Ophthalmic ROS: negative  Endocrine ROS: negative  Respiratory ROS: no cough, shortness of breath, or wheezing  Cardiovascular ROS: no chest pain or dyspnea on exertion  Gastrointestinal ROS: no abdominal pain, denies nausea or vomiting  Genito-Urinary ROS: no c/o Foley removed this morning.  Musculoskeletal ROS: Right knee pain -angina medications  Neurological ROS: no TIA or stroke symptoms, denies dizziness currently      Physical Exam:     Filed Vitals:    07/02/14 0905 07/02/14 1728 07/02/14 2108 07/03/14 0608   BP: 144/67 140/62 143/87 112/53   Pulse: 64 61 67 71   Temp:  96.4 F (35.8 C) 96.8 F (36  C) 97.7 F (36.5 C)   TempSrc:       Resp:  18 20 18    Height:       Weight:       SpO2: 100%  96% 94%         Intake and Output Summary (Last 24 hours) at Date Time    Intake/Output Summary (Last 24 hours) at 07/03/14 6045  Last data filed at 07/03/14 0558   Gross per 24 hour   Intake    530 ml   Output    910 ml   Net   -380 ml       General appearance - alert, well appearing, and in no distress  Mental status - alert, oriented to person, place, and time  Chest - clear to auscultation, no wheezes, rales or rhonchi, symmetric air entry  Heart - normal rate, regular rhythm, normal S1, S2, no murmurs  Abdomen - bowel sounds normal, abdomen is soft, nontender, non distended, obese  Neurological - alert, oriented, normal  speech, no focal findings or movement disorder noted  Musculoskeletal - currently laying in bed, anticipating therapy  Extremities - peripheral pulses normal, no pedal edema, no clubbing or cyanosis    Consultant note reviewed.    Labs:       Recent Labs  Lab 07/03/14  0042 07/02/14  0456   GLUCOSE 135* 120*   BUN 20* 14   CREATININE 0.8 0.7   CALCIUM 8.4 8.8   SODIUM 142 142   POTASSIUM 3.8 3.9   CHLORIDE 108 108   CO2 24 26   ALBUMIN 2.8*  --    EGFR >60.0 >60.0         Recent Labs  Lab 07/03/14  0042   AST (SGOT) 14   ALT 11   ALKALINE PHOSPHATASE 54   ALBUMIN 2.8*   BILIRUBIN, TOTAL 0.3         Recent Labs  Lab 07/03/14  0042 07/02/14  0456   WBC 8.54  --    HEMOGLOBIN 9.4* 10.6*   HEMATOCRIT 30.4* 34.9*   MCV 85.2  --    MCH, POC 26.3*  --    MCHC 30.9*  --    RDW 15  --    MPV 10.6  --        No results for input(s): TSH, FREET3 in the last 168 hours.    Invalid input(s): FREET4        Rads:     Radiology Results (24 Hour)     Procedure Component Value Units Date/Time    X-ray knee right AP and lateral [[id number]] Collected:  07/02/14 0858    Order Status:  Completed Updated:  07/02/14 0903    Narrative:      CLINICAL INDICATION: Arthroplasty, hardware evaluation. The TKA  revision,  longstem    FINDINGS: AP and lateral views of the knee were obtained. The joint  prothesis an intramedullary components are in good position. Is in good  position. There is no fracture or dislocation. Postsurgical changes are  seen in the soft tissues. There is a fracture line through the distal  femur which is well corticated consistent with old injury. This is just  superior to the femoral anterior prosthesis component.      Impression:        Good position of the joint prothesis. No acute fracture or  dislocation.    Kinnie Feil, MD   07/02/2014 8:59 AM                Assessment and Plan:      Patient Active Problem List   Diagnosis   . Total knee replacement status .  Hx of Chronic infection of prosthetic knee.  S/P right TKA revision reimplantation 07/01/14 -continue plan per Ortho/ID  Deep vein thrombosis prophylaxis as per Ortho    . Low back pain   . Chronic obstructive pulmonary disease- no complaints of shortness of breath, currently stable    . Hypertension -blood pressure stable.  Continue medications.     Marland Kitchen Hx Non-healing surgical wound- right knee, poor wound healing-  On juven, Vit C and ensure.   . Acute Blood Loss Anemia, s/p surgery- will add Iron    ESBL, MRSA hx -on isolation  Post nasal drip -continue saline nasal spray, will order Claritin when son brings it in from home. Currently has Zyrtec prn while inpatient.   PONV today- +flatus, no BM since surgery. Will order reglan and dulcolax suppository.  Signed by: Sheryle Hail, NP        Addendum:  Spoke with nursing after lunch time.  Patient did not eat lunch.  No further vomiting, nausea continues.  Will order IV fluids.  Continue to follow.

## 2014-07-03 NOTE — Progress Notes (Signed)
PROGRESS NOTE    Date Time: 07/03/2014 6:26 AM  Patient Name: [name], [name]      Assessment:    s/p R revision reimplantation TKA on  07/01/2014 doing well    Plan:   Continue PT - 50% WB RLE in HKB locked in extension at all times    Continue anticoagulation - ASA/foot pumps    High risk wound: nutrition support (boost, juven) and MVI+Vit C ordered    Wound care: drain removed and dressings changed 9/25, silver dressing placed, will check incision again 9/28 if still here    Abx: IV vanc + invanz per ID consult, cultures pending, patient to go home on po v iv abx per ID recs    D/c planning - home when clears PT and abx dispo determined    Subjective:   Pain controlled, tolerating diet    Medications:     Current Facility-Administered Medications   Medication Dose Route Frequency   . amLODIPine  5 mg Oral Daily   . aspirin EC  325 mg Oral Daily   . bisoprolol  5 mg Oral Daily   . cloNIDine  0.1 mg Oral Daily   . ertapenem  1,000 mg Intravenous Q24H   . Ferrous Sulfate  324 mg Oral QAM W/BREAKFAST   . gabapentin  300 mg Oral Daily   . ketorolac  15 mg Intravenous Q6H   . multivitamin  1 tablet Oral Daily   . potassium chloride  20 mEq Oral BID   . pregabalin  75 mg Oral BID   . saline  2 spray Each Nare BID   . sodium chloride (PF)  3 mL Intravenous Q8H   . tiotropium  18 mcg Inhalation QAM   . traMADol  50 mg Oral 4 times per day   . vitamin C  500 mg Oral BID       Physical Exam:     Filed Vitals:    07/03/14 0608   BP: 112/53   Pulse: 71   Temp: 97.7 F (36.5 C)   Resp: 18   SpO2: 94%       Intake and Output Summary (Last 24 hours) at Date Time    Intake/Output Summary (Last 24 hours) at 07/03/14 0626  Last data filed at 07/03/14 0558   Gross per 24 hour   Intake    530 ml   Output    910 ml   Net   -380 ml       General appearance - no acute distress  Musculoskeletal -  Incision C/D/I  Fires quad/TA/EHL/GSC  SILT SP/DP/TN  WWP distally  Calves soft, nontender bilateral    Labs:     Results     Procedure Component  Value Units Date/Time    Vancomycin, trough [[id number]]  (Abnormal) Collected:  07/03/14 0042    Specimen Information:  Blood Updated:  07/03/14 0143     Vancomycin Trough 24.5 (H) ug/mL      Vancomycin Date of Last Dose 07/02/2014     Narrative:      PLEASE DRAW TROUGH AT 0030 ON 07/03/14  (30 MINUTES BEFORE  0100 DOSE)    CBC WITH MANUAL DIFFERENTIAL [[id number]]  (Abnormal) Collected:  07/03/14 0042     Hgb 9.4 (L) g/dL Updated:  86/57/84 6962     Hematocrit 30.4 (L) %      WBC 8.54 x10 3/uL      RBC 3.57 (L) x10 6/uL  MCV 85.2 fL      MCH 26.3 (L) pg      MCHC 30.9 (L) g/dL      RDW 15 %      Platelets 174 x10 3/uL      MPV 10.6 fL      Segmented Neutrophils 80 %      Band Neutrophils 2 %      Lymphocytes Manual 14 %      Monocytes Manual 3 %      Eosinophils Manual 1 %      Basophils Manual 0 %      Nucleated RBC 0 /100 WBC      Abs Seg Manual 6.83 x10 3/uL      Bands Absolute 0.17 x10 3/uL      Absolute Lymph Manual 1.20 x10 3/uL      Monocytes Absolute 0.26 x10 3/uL      Absolute Eos Manual 0.09 x10 3/uL      Absolute Baso Manual 0.00 x10 3/uL      Cell Morphology: Normal      Platelet Estimate Adequate     Narrative:      PLEASE DRAW TROUGH AT 0030 ON 07/03/14  (30 MINUTES BEFORE  0100 DOSE)    C Reactive Protein [[id number]]  (Abnormal) Collected:  07/03/14 0042    Specimen Information:  Blood Updated:  07/03/14 0134     C-Reactive Protein 6.5 (H) mg/dL     Narrative:      PLEASE DRAW TROUGH AT 0030 ON 07/03/14  (30 MINUTES BEFORE  0100 DOSE)    GFR [[id number]] Collected:  07/03/14 0042     EGFR >60.0 Updated:  07/03/14 0134    Narrative:      PLEASE DRAW TROUGH AT 0030 ON 07/03/14  (30 MINUTES BEFORE  0100 DOSE)    Comprehensive metabolic panel [[id number]]  (Abnormal) Collected:  07/03/14 0042    Specimen Information:  Blood Updated:  07/03/14 0134     Glucose 135 (H) mg/dL      BUN 20 (H) mg/dL      Creatinine 0.8 mg/dL      CALCIUM 8.4 mg/dL      Sodium 086 mEq/L      Potassium 3.8 mEq/L      Chloride 108  mEq/L      CO2 24 mEq/L      Protein, Total 6.4 g/dL      Albumin 2.8 (L) g/dL      AST (SGOT) 14 U/L      ALT 11 U/L      Alkaline Phosphatase 54 U/L      Bilirubin, Total 0.3 mg/dL      Globulin 3.6 g/dL      Albumin/Globulin Ratio 0.8 (L)     Narrative:      PLEASE DRAW TROUGH AT 0030 ON 07/03/14  (30 MINUTES BEFORE  0100 DOSE)    Sedimentation rate (ESR) [[id number]]  (Abnormal) Collected:  07/03/14 0042    Specimen Information:  Blood Updated:  07/03/14 0129     Sed Rate 53 (H) mm/Hr     Narrative:      PLEASE DRAW TROUGH AT 0030 ON 07/03/14  (30 MINUTES BEFORE  0100 DOSE)    Wound culture & gram stain [[id number]] Collected:  07/01/14 1600    Specimen Information:  Wound / Tissue Updated:  07/02/14 1831    Narrative:      ORDER#: [id number]  ORDERED BY: GOYAL, NITIN  SOURCE: Tissue Right Knee Tissue #1                  COLLECTED:  07/01/14 16:00  ANTIBIOTICS AT COLL.:                                RECEIVED :  07/01/14 18:21  Stain, Gram                                FINAL       07/01/14 21:35  07/01/14   No WBCs or organisms seen             No Squamous epithelial cells seen  Culture and Gram Stain, Aerobic, Wound     PRELIM      07/02/14 18:31  07/02/14   Culture no growth to date, Final report to follow      Wound culture & gram stain [[id number]] Collected:  07/01/14 1600    Specimen Information:  Wound / Tissue Updated:  07/02/14 1831    Narrative:      ORDER#: [id number]                                    ORDERED BY: Ahmed Prima, NITIN  SOURCE: Tissue Right Knee Tissue #3                  COLLECTED:  07/01/14 16:00  ANTIBIOTICS AT COLL.:                                RECEIVED :  07/01/14 18:26  Stain, Gram                                FINAL       07/01/14 21:31  07/01/14   Few WBCs             No organisms seen             No Squamous epithelial cells seen  Culture and Gram Stain, Aerobic, Wound     PRELIM      07/02/14 18:31  07/02/14   Culture no growth to date, Final report to  follow      Wound culture & gram stain [[id number]] Collected:  07/01/14 1600    Specimen Information:  Wound / Tissue Updated:  07/02/14 1746    Narrative:      ORDER#: [id number]                                    ORDERED BY: Ahmed Prima, NITIN  SOURCE: Tissue Right Knee Tissue #2                  COLLECTED:  07/01/14 16:00  ANTIBIOTICS AT COLL.:                                RECEIVED :  07/01/14 18:27  Stain, Gram  FINAL       07/01/14 21:27  07/01/14   No WBCs or organisms seen             No Squamous epithelial cells seen  Culture and Gram Stain, Aerobic, Wound     PRELIM      07/02/14 17:46  07/02/14   Culture no growth to date, Final report to follow      MRSA culture [[id number]] Collected:  07/02/14 0659    Specimen Information:  Body Fluid / Nares and Throat Updated:  07/02/14 1318    RBC OR HOLD Blood Product [[id number]] Collected:  07/01/14 1200     RBC Leukoreduced [id number]    released Updated:  07/02/14 0900    Narrative:      ?Special Requirements->No special requirements  ?Surgery/ Procedure (to be Performed)->Right knee revision  ?Surgery/Procedure Date:->07/01/14  ?Is the patient pregnant?->No  ?Has the patient been transfused w/i the last 3 months?->No          Physical Therapy:   Pain Score: 1-mild pain (07/03/14 0551)  Weight Bearing Percent: 50 (07/02/14 1756)  Ambulation Distance (Feet): 20 Feet (07/02/14 1756)  Right Lower Extremity ROM: within functional limits (knee in IROM locked in extension) (07/02/14 1000)  Left Lower Extremity ROM: within functional limits (07/02/14 1000)              Rads:   Radiological Procedure reviewed.    Signed by: Collene Schlichter III

## 2014-07-03 NOTE — PT Progress Note (Signed)
Physical Therapy Note    Physical Therapy Treatment    Patient:  [name]        MRN#:  [id number]  Unit:  [place] [facility] 4[facility]        Room/Bed:  M447/M447.01    Time of treatment: Start Time: 1005 Stop Time: 1030  Time Calculation (min): 25 min  PT Received On: 07/03/14    Treatment #: PT Visit Number: 2/10    Precautions  Weight Bearing Status: RLE partial weight bearing  Weight Bearing Percent: 50  Total Knee Replacement: hinge brace;at all times (locked in extension)  Precaution Instructions Given to Patient: Yes  MD orders for Exercises: Active and passive exercise.      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    Patient's medical condition is appropriate for Physical Therapy  intervention at this time.  Patient cleared by nurse to participate in PT session, nursing reports patient vomited earlier and meds given.    ASSESSMENT   Patient reports not feeling well today.  Vomited earlier.  Not able to get OOB due to severe nausea.  2nd PT session scheduled.    Goals per Eval/Re-eval:     Goals  Goal Formulation: With patient/family  Time for Goal Acheivement: 10 visits  Goals: Select goal  Pt Will Perform Sit to Stand: with stand by assist  Pt Will Transfer to Toilet: with stand by assist  Pt Will Ambulate: 31-50 feet, with stand by assist  Pt Will Go Up / Down Stairs: 3-5 stairs, with contact guard assist  Pt Will Perform Home Exer Program: with minimal assist  Pt Will Propel Wheelchair: 51-150 feet, modified independent  Pt Will Perform LE Dressing w/Device: with minimal assist    PLAN     Recommendation  Discharge Recommendation: Home with home health PT  PT - Next Visit Recommendation: 07/03/14  PT Frequency: twice a day    Continue plan of care.    SUBJECTIVE   "I don't feel good  at all today"  Patient is agreeable to participation in the therapy session. Family and/or guardian are agreeable to patient's participation in the therapy session.     Pain Assessment  Pain Assessment: Numeric Scale (0-10)  Pain Score: 4-moderate pain  POSS Score: Slightly drowsy, easily aroused  Pain Location: Knee  Pain Orientation: Right;Anterior;Posterior  Pain Descriptors: Sore;Aching  Patient's Stated Comfort Functional Goal: 4-moderate pain  Pain Intervention(s): Cold applied;Repositioned          OBJECTIVE     Patient is in bed seen 2 Days Post-Op with IV, dressings, AVIs and IROM brace on RLE locked in extension.                                       Sensation is intact     Cognition  Arousal/Alertness: Appropriate responses to stimuli  Following Commands: Follows all commands and directions without difficulty    Functional Mobility  Functional Mobility Deferred (Comment): severe nausea ex bedside only, will check back later              Therapeutic Exercise  Straight Leg Raise: 10aa/R+L  Quad Sets: 25  Heelslides: 10 on L  Glute Sets: 25  Hip Abduction: 10 on R+L  Knee AROM Short Arc Quad: 25 on L  Ankle Pumps: 25  Shoulder AROM: B UE 10 reps       Self Care and Home Management:  Pt did not perform self care activities at this time.    Educated the patient to role of physical therapy, plan of care, goals  of therapy and Precautions  Leg Exercises  Range of Motion Exercises.    Patient continues to need focused education on Weight Bearing  Gait Techniques  Transfer Techniques  Leg Exercises  Range of Motion Exercises.    Patient is in bed with all needs provided and call bell within reach. RN notified of session outcome, including 2nd PT session scheduled.        Signature: Howie Ill, PT 07/03/2014 10:37 AM

## 2014-07-03 NOTE — Plan of Care (Signed)
Problem: Day 2 Post-op- Knee Surgery  Goal: Hemodynamic Stability  Outcome: Not Progressing  VSS and dressing is CDI  Goal: Pain at adequate level as identified by patient  Outcome: Progressing  Patient's CFG is 3 and she rates her pain 2/10.  Goal: Stable Neurovascular Status  Outcome: Progressing  Goal: Free from Infection  Outcome: Progressing  Patient on antibiotic therapy related to right knee wound infection.  Goal: Patient will maintain normal GI status (Post Op Day 2- Knee Surgery)  Outcome: Not Progressing  Patient c/o nausea and vomited x1. Medicated with antiemetic Reglan 10 mg IVP and one dulcolax suppository administered as ordered.Marland Kitchen

## 2014-07-03 NOTE — PT Progress Note (Signed)
Physical Therapy Treatment    Patient:  [name]        MRN#:  [id number]  Unit:  [place] [facility]        Room/Bed:  X914/N829.56    Time of treatment: Start Time: 1445 Stop Time: 1500  Time Calculation (min): 15 min  PT Received On: 07/03/14    Treatment #: PT Visit Number: 3/10    Precautions  Weight Bearing Status: RLE partial weight bearing  Weight Bearing Percent: 50  Total Knee Replacement: hinge brace;at all times (locked in extension)  Precaution Instructions Given to Patient: Yes  MD orders for Exercises: active and passive w/ leg brace on at all times    Medical Diagnosis:   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    Patient's medical condition is appropriate for Physical Therapy  intervention at this time.  Patient cleared by nurse to participate in PT session, nursing reports patient not eating or drinking much.  I am starting an IV which is fragile.    ASSESSMENT   S/p R TKA revision     50WB   Bedside exercise this pm.      Goals per Eval/Re-eval:     Goals  Goal Formulation: With patient/family  Time for Goal Acheivement: 10 visits  Goals: Select goal  Pt Will Perform Sit to Stand: with stand by assist  Pt Will Transfer to Toilet: with stand by assist  Pt Will Ambulate: 31-50 feet, with stand by assist  Pt Will Go Up / Down Stairs: 3-5 stairs, with contact guard assist  Pt Will Perform Home Exer Program: with minimal assist  Pt Will Propel Wheelchair: 51-150 feet, modified independent  Pt Will Perform LE Dressing w/Device: with minimal assist    PLAN     Recommendation  Discharge Recommendation: Home with home health PT  PT - Next Visit Recommendation: 07/04/14  PT Frequency: twice a day    Continue plan of care.    SUBJECTIVE   I am weak.   Hoping I can go home tomorrow on oral  antibiotics.   Patient is agreeable to participation in the therapy session.     Pain Assessment  Pain Assessment: No/denies pain  Pain Score: 4-moderate pain  POSS Score: Slightly drowsy, easily aroused  Pain Location: Knee  Pain Orientation: Right;Anterior;Posterior  Pain Descriptors: Sore;Aching  Patient's Stated Comfort Functional Goal: 4-moderate pain  Pain Intervention(s): Cold applied;Repositioned          OBJECTIVE     Patient is in bed seen 2 Days Post-Op with IV and dressings.                                       Sensation is intact     Cognition  Arousal/Alertness: Appropriate responses to stimuli  Following Commands: Follows all commands and directions without difficulty    Functional Mobility  Functional Mobility Deferred (Comment): severe nausea ex bedside only, will check back later              Therapeutic Exercise  Straight Leg Raise: 10  Quad Sets: 25  Heelslides: 10 on L  Glute Sets: 25  Hip Abduction: 25  Knee AROM Short Arc Quad: 25 on L  Ankle Pumps: 25  Shoulder AROM: B UE 10 reps       Self Care and Home Management:  Pt did not perform self care activities at this time.    Educated the patient to role of physical therapy, plan of care, goals  of therapy and Weight Bearing  Gait Techniques  Transfer Techniques  Leg Exercises.    Patient continues to need focused education on Weight Bearing  Gait Techniques  Transfer Techniques  Leg Exercises.    Patient is in bed with all needs provided and call bell within reach. RN notified of session outcome, including patient seen bedside today, will attempt gait in the am.        Signature: Louann Liv, PTA 07/03/2014 3:53 PM

## 2014-07-03 NOTE — Progress Notes (Signed)
Pharmacy Note: Vancomycin Monitoring Consult    Day #3 Vancomycin      SCr - 0.8 (07/03/14 0042)  Est CrCl - 64.2 mL/min (Cockcroft-Gault w/SCr - 0.8 mg/dL; Adj BW - 68 kg)      Nephrotoxic drugs administered in the past 48 hours:    Aspirin   Celebrex   Toradol    Recent Labs  Lab 07/03/14  0042   WBC 8.54       Tmax in last 24h: 97.7 F (36.5 C)      Cultures:   Date Source  Organism Pertinent Susceptibilities     07/01/14 Blood (anaerobic) x3 Pending    07/01/14 Wound (R knee tissue) x 3 No growth to date    07/01/14 Wound (R knee tissue) x3   anaerobic  No growth to date    07/02/14  Nares & throat Negative for MRSA        Imaging and other pertinent findings:   07/01/14    Antibiotic spacer containing vancomycin 3 g implanted in R knee       Vancomycin indication: s/p R TKA; Empiric for PJI  Vancomycin target trough level: 15-20 mg/L (closer to lower end of target)    Current vancomycin regimen: Vancomycin on hold; last dose of 1500 mg IV Q12H received 07/02/14 at 1243    Measured Vancomycin level results:  Random level = 16.5 mg/L (07/03/14 1337)    Assessment/Plan:   A trough was previously drawn after 3 doses of vancomycin 1500 mg IV Q12H and returned supra-therapeutic at  24.5 mg/L.  Vancomycin was subsequently held and a random level was checked this afternoon approximately 24 hours after the last  dose and returned at 16.5 mg/L.  The patient was previously dose per protocol but is noted to have 3 g of vancomycin in the  antibiotic spacer.  Additionally, the patient is receiving empiric therapy and it is therefore not necessary to dose too aggressively.  Recommend dosing for levels < 15 mg/L and trying to maintain the trough closer to 15 mg/L until final cultures.  Therefore, will  check a random level 07/03/14 at 1230 which is ~48 h after the last dose received, as expect level to sufficiently decline by that time.      New Recommendations:   1.  Check a random level 07/03/14 at 1230    2.  If the above random  is < 15 mg/L, recommend dose with 1000 mg IV once, and check another random 12 hours post-dose, to get a       better feel for how patient is handling vancomycin.  As well as to determine if it's best to Q24H vs. Q12H.  It is possible that the patient       will do well 1000 mg IV Q12H.  However, keep in mind that the joint spacer implant contains vancomycin.    Date 07/01/14 07/02/14 07/03/14  07/04/14      Dose  1500 mg q12h On hold       Scr & CrCl    0.7(73.4 ml/min) 0.8/64.2 ml/min       Vanco level (indicate if random or trough)  Trough due 9/25 at 0030 = 24.5 Random due 9/25 at 1300 = 16.5 Random due 9/26 at 1240      Level drawn correctly?    yes Y       Comments              Thank you,  Alfonse Ras, PharmD  [facility]  Pharmacy Department  [phone]

## 2014-07-03 NOTE — Consults (Signed)
INFECTIOUS DISEASES CONSULTATION  [facility]  [phone] [facility] [address].   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 Diff colitis     Nares mrsa colonization     Pt admitted now for right knee reimplantation    Assessment:     Patient Active Problem List   Diagnosis   . Total knee replacement status   . Low back pain   . Chronic obstructive pulmonary disease   . Hypertension   . Debility following multiple R knee revisions   . Non-healing surgical wound- right knee    . Fever   . Leukocytosis   . Anemia   . Hypokalemia   . Chronic infection of prosthetic knee   . COPD (chronic obstructive pulmonary disease)   . HTN (hypertension)   . Infection   . Osteoarthritis     The patient is a 78 y.o. female admitted on 07/01/2014 for   Reimplantation of right total knee     Cultures from 07/01/14 are negative so far.         Recommendation:   Invanz 1 gram q 24 hrs  Hold vanco,  resume as per level    Defer flagyl prophylaxis for c diff until nausea/p op ileus resolved    bactroban ointment to nares as precaution to prevent recolonization  While in hospital      History of Pesent Illness:   [name] is a 78 y.o. female who is well-known to our service   She had initial right TKR 08/25/13 by Dr Ahmed Prima.  Due to prolonged drainage, she underwent I and D and poly exchange on   On 09/05/13. OR cxs + pan-sensitive proteus. Pt was treated with po   cipro 750 bid. Unfortunately, bloody drainage persisted and a resection  Arthroplasty c placement of abx spacer was performed. On 09/15/14  Unfortunately, bloody drainage persisted and wound  healing was poor.  Accordingly, pt underwent placement of a new abx spacer and a gastrocnemius  Flap on 09/22/13. Of note, all cxs since 08/25/13 have been negative.  cipro was discontinued .    Pt had problems with slow wound healing of the gastroc flap.   She had prolonged  wound vac therapy.   In late May, she developed  a new wound developed at the apex   of the healed.Total knee incision. This wound was noted when   long adherent Eschar finally fell off. Over the last couple of weeks,   there was Increased concern as this wound was becoming larger.   Cxs of wound From 03/10/14 grew esbl e.c oli and group c strep    Pt was admitted on 03/18/14 from our office.  She had fever  And rigors.  She had cellulitis of the right lower leg.  Admission blood cxs grew GBS.  Unfortunately,  UA and urine cx were not sent prior to initiation of abx tx.      Pt was treated with Invanz through 04/10/14.      She was then observed  off of abx.      A right knee aspirate was performed on 06/01/14 by ortho PA.  Barely any fluid was obtained on difficult aspiration.  Fluid cx revealed no wbc, but grew Alcaligenes.  This was felt to represent contamination.    crp was 1.1 (0.5) and esr was 44 (20).      Repeat esr on 9/1 was 34.and crp was 0.5 (no abx given in the   Interim )        Repeat esr on 9/10 was 33 and crp was 0.7 (no abx given in the  Interim )    preop nares screen was + and pt tx'd with bactroban.      Given favorable esr and crp, the 8/24 asp cx was felt to represent  Cotamination.  Accordingly, Dr Ahmed Prima opted to proceed with  Reimplantation of the right total knee.    No recent travel  No known tick exposure/insect exposure    Medications:     Current Facility-Administered Medications   Medication Dose Route Frequency   . amLODIPine  5 mg Oral Daily   . aspirin EC  325 mg Oral Daily   . bisacodyl  10 mg Rectal Once   . bisoprolol  5 mg Oral Daily   . cloNIDine  0.1 mg Oral Daily   . ertapenem  1,000 mg Intravenous Q24H   .  Ferrous Sulfate  324 mg Oral QAM W/BREAKFAST   . gabapentin  300 mg Oral Daily   . multivitamin  1 tablet Oral Daily   . potassium chloride  20 mEq Oral BID   . pregabalin  75 mg Oral BID   . saline  2 spray Each Nare BID   . sodium chloride (PF)  3 mL Intravenous Q8H   . tiotropium  18 mcg Inhalation QAM   . traMADol  50 mg Oral 4 times per day   . vitamin C  500 mg Oral BID       Antibiotics:   As per HPI  vanco --->held 9/25  invanz 9/24--->  Ctx 9/23---->off 9/24          Allergies:   No Known Allergies    Past Medical History:     Past Medical History   Diagnosis Date   . Hypertension    . Pneumonia    . Chronic obstructive pulmonary disease    . Headache(784.0)      not since surgery in 1957   . Arthritis    . Difficulty in walking(719.7)    . Low back pain    . Urinary tract infection    . Depression        Past Surgical History:     Past Surgical History   Procedure Laterality Date   . Cervical spine surgery     . Lumbar spine surgery  posterior w/ hardware   . Tonsillectomy     . Brain surgery  R side/ 1957 Optic nerve pressure   . Eye surgery       cataracts   . Colonoscopy     . Tubal ligation  1961   . Arthroplasty, knee, total  08/25/2013     Procedure: ARTHROPLASTY, KNEE, TOTAL;  Surgeon: Lane Hacker, MD;  Location: [facility];  Service: Orthopedics;  Laterality: Right;   . Arthroplasty, knee, revision total  09/05/2013     Procedure: ARTHROPLASTY, KNEE, REVISION TOTAL;  Surgeon: Lane Hacker, MD;  Location: [facility];  Service: Orthopedics;  Laterality: Right;  RT KNEE WASHOUT & POLY EXCHANGE **POA/CH** (STRYKER)    . Arthroplasty, knee, resection  09/15/2013     Procedure: ARTHROPLASTY, KNEE, RESECTION;  Surgeon: Lane Hacker, MD;  Location: [facility];  Service: Orthopedics;  Laterality: Right;  AND PLACEMENT OF ANTIBIOTIC SPACER  Placement of wound vac   . Arthroplasty, knee, revision total  09/22/2013     Procedure: ARTHROPLASTY, KNEE, REVISION TOTAL;  Surgeon: Lane Hacker,  MD;  Location: [facility];  Service: Orthopedics;  Laterality: Right;   . Debridement, muscle flap closure  09/22/2013     Procedure: DEBRIDEMENT, MUSCLE FLAP CLOSURE;  Surgeon: Ernie Avena, MD;  Location: [facility];  Service: Plastics;  Laterality: Right;  DEBRIDEMENT & MUSCLE FLAP KNEE RT   . Graft, skin  split thickness, (medical)  09/22/2013     Procedure: GRAFT, SKIN  SPLIT THICKNESS, (MEDICAL);  Surgeon: Ernie Avena, MD;  Location: [facility];  Service: Plastics;  Laterality: N/A;   . Echocardiogram, transesophageal N/A 03/26/2014     Procedure: ECHOCARDIOGRAM, TRANSESOPHAGEAL;  Surgeon: Marni Griffon, MD;  Location: [facility];  Service: Cardiology;  Laterality: N/A;   . Arthroplasty, knee, revision total Right 07/01/2014     Procedure: ARTHROPLASTY, KNEE, REVISION TOTAL;  Surgeon: Lane Hacker, MD;  Location: [facility];  Service: Orthopedics;  Laterality: Right;       Social History:     History     Social History   . Marital Status: Widowed     Spouse Name: N/A     Number of Children: N/A   . Years of Education: N/A     Social History Main Topics   . Smoking status: Former Smoker -- 1.00 packs/day for 45 years     Quit date: 08/20/1998   . Smokeless tobacco: Never Used   . Alcohol Use: Yes      Comment: occassionally   . Drug Use: No   . Sexual Activity: Not on file     Other Topics Concern   . Not on file     Social History Narrative         Family History:   History reviewed. No pertinent family history.  No ill contacts in the family  Review of Systems:   No Fevers, Chills, Rigors  No HA  No Neck Stiffness  No sore throat, dysphagia, or odynophagia  No cough, Chest Pain, or shortness of breath  No N/V/D  No abdominal pain  No dysuria, frequency, hesitancy, hematuria   No flank pain/No CVAT  No itching or rash  No focal neurologic complaints  +anticipated post op right knee discomfort   C/o constipation/nausea  No vomiting  No bm since before surgery    Physical  Exam:     Filed Vitals:    07/02/14 1728 07/02/14 2108 07/03/14 0608 07/03/14 0834   BP: 140/62 143/87 112/53 135/60   Pulse: 61 67 71 93   Temp: 96.4 F (35.8 C) 96.8 F (36 C) 97.7 F (36.5 C)    TempSrc:       Resp: 18 20 18 16    Height:       Weight:       SpO2:  96% 94% 91%       Temp (24hrs), Avg:97 F (36.1 C), Min:96.4 F (35.8 C), Max:97.7 F (36.5 C)      piv    Active PICC  Line / CVC Line / PIV Line / Drain / Airway / Intraosseous Line / Epidural Line / ART Line / Line / Wound / Pressure Ulcer / NG/OG Tube     Name:   Placement date:   Placement time:   Site:   Days:    PICC Single Lumen 03/24/14 Right Basilic  03/24/14   1518   Basilic   100    Peripheral IV 07/01/14 Left Wrist  07/01/14   1339   Wrist   1    Closed/Suction Drain Right Knee Accordion 10 Fr.  07/01/14   1552   Knee   1    Incision Site 07/01/14 Knee Right  07/01/14   1615     1          Intake and Output Summary (Last 24 hours) at Date Time    Intake/Output Summary (Last 24 hours) at 07/03/14 1155  Last data filed at 07/03/14 0558   Gross per 24 hour   Intake    530 ml   Output    910 ml   Net   -380 ml       Well-developed, well-nourished in no apparent distress  Sclera anicteric, conjunctivae clear  Oral pharynx s erythema, exudate, or other significant lesions  No Thrush  Neck supple, s lymphadenopathy  Lungs Clear  Heart Regular rate and rhythm s murmurs, rubs, or gallops  Abdomen Soft, non tender, non distended, normal bowel sounds  Back No Costovertebral angle tenderness, no point tenderness along the spine  Extremities No Cyanosis, no clubbing, or edema  Palpable pedal pulses  Neurologic exam is grossly non-focal  Skin without rash or other significant lesions      Labs Reviewed:     Results     Procedure Component Value Units Date/Time    MRSA culture [[id number]] Collected:  07/02/14 0659    Specimen Information:  Body Fluid / Nasal/Throat ASC Admission Updated:  07/03/14 1005    Narrative:      ORDER#: [id number]                                     ORDERED BY: Ahmed Prima, NITIN  SOURCE: Nares and Throat                             COLLECTED:  07/02/14 06:59  ANTIBIOTICS AT COLL.:                                RECEIVED :  07/02/14 13:18  Culture MRSA Surveillance                  FINAL       07/03/14 10:05  07/03/14   Negative for Methicillin Resistant Staph aureus from Nares and             Negative for Methicillin Resistant Staph aureus from Throat      Anaerobic culture [[id number]] Collected:  07/01/14 1600    Specimen Information:  Other / Tissue Updated:  07/03/14 0912    Narrative:      ORDER#: [id number]                                    ORDERED  BY: GOYAL, NITIN  SOURCE: Tissue Right Knee Tissue #3                  COLLECTED:  07/01/14 16:00  ANTIBIOTICS AT COLL.:                                RECEIVED :  07/01/14 18:25  Culture, Anaerobic Bacteria                PRELIM      07/03/14 09:12  07/03/14   No growth to date, final report to follow      Anaerobic culture [[id number]] Collected:  07/01/14 1600    Specimen Information:  Other / Tissue Updated:  07/03/14 0912    Narrative:      ORDER#: [id number]                                    ORDERED BY: Ahmed Prima, NITIN  SOURCE: Tissue Right Knee Tissue #1                  COLLECTED:  07/01/14 16:00  ANTIBIOTICS AT COLL.:                                RECEIVED :  07/01/14 18:22  Culture, Anaerobic Bacteria                PRELIM      07/03/14 09:12  07/03/14   No growth to date, final report to follow      Anaerobic culture [[id number]] Collected:  07/01/14 1600    Specimen Information:  Other / Tissue Updated:  07/03/14 0912    Narrative:      ORDER#: [id number]                                    ORDERED BY: Ahmed Prima, NITIN  SOURCE: Tissue Right Knee Tissue #2                  COLLECTED:  07/01/14 16:00  ANTIBIOTICS AT COLL.:                                RECEIVED :  07/01/14 18:27  Culture, Anaerobic Bacteria                PRELIM      07/03/14 09:12  07/03/14   No growth to date, final report  to follow      Vancomycin, trough [[id number]]  (Abnormal) Collected:  07/03/14 0042    Specimen Information:  Blood Updated:  07/03/14 0143     Vancomycin Trough 24.5 (H) ug/mL      Vancomycin Date of Last Dose 07/02/2014     Narrative:      PLEASE DRAW TROUGH AT 0030 ON 07/03/14  (30 MINUTES BEFORE  0100 DOSE)    CBC WITH MANUAL DIFFERENTIAL [[id number]]  (Abnormal) Collected:  07/03/14 0042     Hgb 9.4 (L) g/dL Updated:  53/66/44 0347     Hematocrit 30.4 (L) %      WBC 8.54 x10 3/uL      RBC 3.57 (L) x10 6/uL  MCV 85.2 fL      MCH 26.3 (L) pg      MCHC 30.9 (L) g/dL      RDW 15 %      Platelets 174 x10 3/uL      MPV 10.6 fL      Segmented Neutrophils 80 %      Band Neutrophils 2 %      Lymphocytes Manual 14 %      Monocytes Manual 3 %      Eosinophils Manual 1 %      Basophils Manual 0 %      Nucleated RBC 0 /100 WBC      Abs Seg Manual 6.83 x10 3/uL      Bands Absolute 0.17 x10 3/uL      Absolute Lymph Manual 1.20 x10 3/uL      Monocytes Absolute 0.26 x10 3/uL      Absolute Eos Manual 0.09 x10 3/uL      Absolute Baso Manual 0.00 x10 3/uL      Cell Morphology: Normal      Platelet Estimate Adequate     Narrative:      PLEASE DRAW TROUGH AT 0030 ON 07/03/14  (30 MINUTES BEFORE  0100 DOSE)    C Reactive Protein [[id number]]  (Abnormal) Collected:  07/03/14 0042    Specimen Information:  Blood Updated:  07/03/14 0134     C-Reactive Protein 6.5 (H) mg/dL     Narrative:      PLEASE DRAW TROUGH AT 0030 ON 07/03/14  (30 MINUTES BEFORE  0100 DOSE)    GFR [[id number]] Collected:  07/03/14 0042     EGFR >60.0 Updated:  07/03/14 0134    Narrative:      PLEASE DRAW TROUGH AT 0030 ON 07/03/14  (30 MINUTES BEFORE  0100 DOSE)    Comprehensive metabolic panel [[id number]]  (Abnormal) Collected:  07/03/14 0042    Specimen Information:  Blood Updated:  07/03/14 0134     Glucose 135 (H) mg/dL      BUN 20 (H) mg/dL      Creatinine 0.8 mg/dL      CALCIUM 8.4 mg/dL      Sodium 295 mEq/L      Potassium 3.8 mEq/L      Chloride 108 mEq/L       CO2 24 mEq/L      Protein, Total 6.4 g/dL      Albumin 2.8 (L) g/dL      AST (SGOT) 14 U/L      ALT 11 U/L      Alkaline Phosphatase 54 U/L      Bilirubin, Total 0.3 mg/dL      Globulin 3.6 g/dL      Albumin/Globulin Ratio 0.8 (L)     Narrative:      PLEASE DRAW TROUGH AT 0030 ON 07/03/14  (30 MINUTES BEFORE  0100 DOSE)    Sedimentation rate (ESR) [[id number]]  (Abnormal) Collected:  07/03/14 0042    Specimen Information:  Blood Updated:  07/03/14 0129     Sed Rate 53 (H) mm/Hr     Narrative:      PLEASE DRAW TROUGH AT 0030 ON 07/03/14  (30 MINUTES BEFORE  0100 DOSE)    Wound culture & gram stain [[id number]] Collected:  07/01/14 1600    Specimen Information:  Wound / Tissue Updated:  07/02/14 1831    Narrative:      ORDER#: [id number]  ORDERED BY: GOYAL, NITIN  SOURCE: Tissue Right Knee Tissue #1                  COLLECTED:  07/01/14 16:00  ANTIBIOTICS AT COLL.:                                RECEIVED :  07/01/14 18:21  Stain, Gram                                FINAL       07/01/14 21:35  07/01/14   No WBCs or organisms seen             No Squamous epithelial cells seen  Culture and Gram Stain, Aerobic, Wound     PRELIM      07/02/14 18:31  07/02/14   Culture no growth to date, Final report to follow      Wound culture & gram stain [[id number]] Collected:  07/01/14 1600    Specimen Information:  Wound / Tissue Updated:  07/02/14 1831    Narrative:      ORDER#: [id number]                                    ORDERED BY: Ahmed Prima, NITIN  SOURCE: Tissue Right Knee Tissue #3                  COLLECTED:  07/01/14 16:00  ANTIBIOTICS AT COLL.:                                RECEIVED :  07/01/14 18:26  Stain, Gram                                FINAL       07/01/14 21:31  07/01/14   Few WBCs             No organisms seen             No Squamous epithelial cells seen  Culture and Gram Stain, Aerobic, Wound     PRELIM      07/02/14 18:31  07/02/14   Culture no growth to date, Final report to follow       Wound culture & gram stain [[id number]] Collected:  07/01/14 1600    Specimen Information:  Wound / Tissue Updated:  07/02/14 1746    Narrative:      ORDER#: [id number]                                    ORDERED BY: Ahmed Prima, NITIN  SOURCE: Tissue Right Knee Tissue #2                  COLLECTED:  07/01/14 16:00  ANTIBIOTICS AT COLL.:                                RECEIVED :  07/01/14 18:27  Stain, Gram  FINAL       07/01/14 21:27  07/01/14   No WBCs or organisms seen             No Squamous epithelial cells seen  Culture and Gram Stain, Aerobic, Wound     PRELIM      07/02/14 17:46  07/02/14   Culture no growth to date, Final report to follow               Recent Labs  Lab 07/03/14  0042   WBC 8.54   HEMOGLOBIN 9.4*   HEMATOCRIT 30.4*   PLATELETS 174        Recent Labs  Lab 07/03/14  0042   SODIUM 142   POTASSIUM 3.8   CHLORIDE 108   CO2 24   BUN 20*   CREATININE 0.8   CALCIUM 8.4   ALBUMIN 2.8*   PROTEIN, TOTAL 6.4   BILIRUBIN, TOTAL 0.3   ALKALINE PHOSPHATASE 54   ALT 11   AST (SGOT) 14   GLUCOSE 135*     No results for input(s): INR, APTT in the last 168 hours.     vanco trough 24.5 on 1.5 grams q 12 hrs.      Path   Less than wbc's per hpf on reimplant       Rads:     Radiology Results (24 Hour)     ** No results found for the last 24 hours. **           Signed by: Ray Church, MD

## 2014-07-03 NOTE — Plan of Care (Signed)
Problem: Moderate/High Fall Risk Score >/=15  Goal: Patient will remain free of falls  Outcome: Progressing    Problem: Safety  Goal: Patient will be free from injury during hospitalization  Outcome: Progressing  Fall interventions in place.Instructed to call for assistance as needed.Call light placed within reach.Hourly rounding done. Pt agreeable to safety plan.Safety maintained.    Problem: Pain  Goal: Patient's pain/discomfort is manageable  Outcome: Progressing    Problem: Psychosocial and Spiritual Needs  Goal: Demonstrates ability to cope with hospitalization/illness  Outcome: Progressing    Problem: Inadequate Gas Exchange  Goal: Adequately oxygenating and ventilation is improved  Outcome: Progressing  Encouraged IS use while awake.No acute resp distress.    Problem: Day 1 Post-op- Knee Surgery  Goal: Hemodynamic Stability  Outcome: Progressing  Goal: Pain at adequate level as identified by patient  Outcome: Progressing  Pain controlled with current pain regimen, pain level within patient's comfort function goal. Ice pack applied as indicated.Positioned with pillows for comfort. Pt aware of available pain medicines   as needed.  Goal: Stable Neurovascular Status  Outcome: Progressing  Goal: Free from Infection  Outcome: Progressing  VSS,on IV antibiotics.Started on Invanz per ID recommendation/order.Vancomycin trough done,awaiting result.  Goal: Patient will maintain normal GI status (Post Op Day 1-Knee Surgery)  Outcome: Progressing    Problem: Potential for Compromised Skin Integrity  Goal: Skin integrity is maintained or improved  Assess and monitor skin integrity. Identify patients at risk for skin breakdown on admission and per policy. Collaborate with interdisciplinary team and initiate plans and interventions as needed.   Outcome: Progressing

## 2014-07-03 NOTE — Progress Notes (Signed)
Pharmacy Note: Vancomycin Monitoring Consult    Day # Vancomycin: 3    CREATININE: 0.8 (07/03/14 0042)  Estimated creatinine clearance - 64.2 mL/min      Nephrotoxic drugs administered in the past 48 hours: celecoxib, ketorlac    WBC:   Recent Labs  Lab 07/03/14  0042   WBC 8.54     Tmax in last 24h: 96.8    Cultures:   Date Source  Organism & Pertinent Susceptibilities     07/01/14 Blood (anaerobic) x3 Pending   07/01/14 Wound (knee tissue) x 3 No organisms seen, no growth to date (9/24 18:31)   07/02/14  MRSA surveillance -nares & throat Pending          Imaging and other pertinent findings:   07/01/14    Antibiotic spacer containing vancomycin 3 g implanted in R knee       Vancomycin indication: s/p R TKA; empiric for PJI  Vancomycin target trough level: 15-20 mg/L    Current vancomycin regimen: 1500 mg IV Q12H    Measured Vancomycin level results: 24.5 mg/L 07/03/14 @ 0042    Assessment/Plan: Trough level is supratherapeutic after 3 doses of vancomycin 1500 mg, as dosed per protocol.  Contacted RN and asked that 1500 mg dose due @ 1 AM be held.  Ordered random level for today @ 1300, approximately 24 hours after last dose.  Will base further dosing on that result.     New Recommendations:     Hold vancomycin.  Recheck trough at 13:00 today and adjust dose based on result.    Thank you,  Jobe Gibbon  Phone: [phone]

## 2014-07-04 LAB — BASIC METABOLIC PANEL
BUN: 18 mg/dL (ref 7–19)
CO2: 24 mEq/L (ref 22–29)
Calcium: 8.6 mg/dL (ref 7.9–10.2)
Chloride: 111 mEq/L (ref 100–111)
Creatinine: 0.8 mg/dL (ref 0.6–1.0)
Glucose: 110 mg/dL — ABNORMAL HIGH (ref 70–100)
Potassium: 3.9 mEq/L (ref 3.5–5.1)
Sodium: 145 mEq/L (ref 136–145)

## 2014-07-04 LAB — VANCOMYCIN, RANDOM: Vancomycin Random: 7.3 ug/mL

## 2014-07-04 LAB — HEMOGLOBIN AND HEMATOCRIT, BLOOD
Hematocrit: 28.8 % — ABNORMAL LOW (ref 37.0–47.0)
Hgb: 9.1 g/dL — ABNORMAL LOW (ref 12.0–16.0)

## 2014-07-04 LAB — GFR: EGFR: >60

## 2014-07-04 MED ORDER — VANCOMYCIN HCL 1000 MG IV SOLR
1000.0000 mg | INTRAVENOUS | Status: DC
Start: 2014-07-05 — End: 2014-07-04

## 2014-07-04 MED ORDER — AMOXICILLIN-POT CLAVULANATE 875-125 MG PO TABS
1.0000 | ORAL_TABLET | Freq: Two times a day (BID) | ORAL | Status: DC
Start: 2014-07-04 — End: 2014-07-06
  Administered 2014-07-04 – 2014-07-06 (×4): 1 via ORAL
  Filled 2014-07-04 (×4): qty 1

## 2014-07-04 MED ORDER — VANCOMYCIN HCL 1000 MG IV SOLR
1000.0000 mg | Freq: Once | INTRAVENOUS | Status: AC
Start: 2014-07-04 — End: 2014-07-04
  Administered 2014-07-04: 1000 mg via INTRAVENOUS
  Filled 2014-07-04: qty 1000

## 2014-07-04 NOTE — Progress Notes (Signed)
Pharmacy Note: Vancomycin Monitoring Consult    Day # Vancomycin: 4   CREATININE: 0.8 (07/04/14 0448)  Estimated creatinine clearance - 64.2 mL/min    Nephrotoxic drugs administered in the past 48 hours: Aspirin      WBC:   Recent Labs  Lab 07/03/14  0042   WBC 8.54     Tmax in last 24h: 99.1    Cultures:   Date Source  Organism Pertinent Susceptibilities     07/01/14 Blood (anaerobic) x3 No growth to date    07/01/14 Wound (R knee tissue) x 3 No growth to date    07/01/14 Wound (R knee tissue) x3   anaerobic  No growth to date    07/02/14  Nares & throat Negative for MRSA            Imaging and other pertinent findings: 07/01/14    Antibiotic spacer containing vancomycin 3 g implanted in R knee         Vancomycin indication:  s/p R TKA; Empiric for PJI  Vancomycin goal trough level: 15-20 mg/L    Current vancomycin regimen: Dosing for trough levels <15    Measured Vancomycin level result  on 07/04/14 @ 1300=7.3 which is ~ 48 hr after last dose on 07/02/14    Assessment/Plan: A trough was previously drawn after 3 doses of vancomycin 1500 mg IV Q12H and returned supra-therapeutic at  24.5 mg/L.  Vancomycin was subsequently held and a random level was 07/03/14 approximately 24 hours after the last  dose and returned at 16.5 mg/L.   The patient was previously dose per protocol but is noted to have 3 g of vancomycin in the  antibiotic spacer.  Additionally, the patient is receiving empiric therapy and it is therefore not necessary to dose too aggressively.  Recommend dosing for levels < 15 mg/L and trying to maintain the trough closer to 15 mg/L until final cultures.            New Recommendations: Give vancomycin 1000 mg x 1 today since random is < 15. Another random will be ordered 12 hours post-dose on 07/05/14 @ 0300 to get a       better feel for how patient is handling vancomycin as well as to determine if it's best to dose Q24H vs. Q12H.               New Vancomycin level: Random on 07/05/14 @ 0300  Date 07/01/14 07/02/14  07/03/14  07/04/14 07/05/14     Dose  1500 mg q12h On hold 1000 mg x 1      Scr & CrCl    0.7(73.4 ml/min) 0.8/64.2 ml/min Scr=0.8  Cr Cl=64.2      Vanco level (indicate if random or trough)  Trough due 9/25 at 0030 = 24.5 Random due 9/25 at 1300 = 16.5 Random due @ 1300=7.3 Random @ 0300     Level drawn correctly?    yes Y yes      Comments                        Thank you,  Samson Frederic  Phone: [phone]

## 2014-07-04 NOTE — Plan of Care (Signed)
Problem: Hemodynamic Status: Cardiac  Goal: Stable vital signs and fluid balance  Outcome: Progressing  Hx of htn stable with present regimen

## 2014-07-04 NOTE — Plan of Care (Signed)
Problem: Inadequate Gas Exchange  Goal: Adequately oxygenating and ventilation is improved  Outcome: Progressing  No c/o of sob,pox 95% on ra    Problem: Day 2 Post-op- Knee Surgery  Goal: Hemodynamic Stability  Outcome: Progressing  Goal: Pain at adequate level as identified by patient  Outcome: Progressing  Pt states pain level 6/10 well relieved by 2 norco,refused 2400 tramadol  Goal: Stable Neurovascular Status  Outcome: Progressing  Goal: Free from Infection  Outcome: Progressing  Pt on isolation for MRSA/ESBL on Invanz ,vancomycin on holdtill further vancomycin level ,afebrile dressing dry intact ,final cx pending

## 2014-07-04 NOTE — Plan of Care (Signed)
Problem: Impaired Mobility  Goal: Mobility/activity is maintained at optimum level for patient  Outcome: Progressing    Problem: Hemodynamic Status: Cardiac  Goal: Stable vital signs and fluid balance  Outcome: Progressing    Problem: Day 3 Post-op- Knee Surgery  Goal: Stable Neurovascular Status  Outcome: Progressing  Goal: Free from Infection  Outcome: Progressing  Goal: Pain at adequate level as identified by patient  Outcome: Progressing  Goal: Nutritional Status Improving  Outcome: Progressing  Goal: Mobility/activity is maintained at optimum level for patient  Outcome: Progressing  Goal: Patient/Patient Care Companion demonstrates understanding of disease process, treatment plan, medications, and discharge plan (Day 3 Post-op- Knee Surgery)  Outcome: Progressing

## 2014-07-04 NOTE — PT Progress Note (Signed)
Physical Therapy Treatment    Patient:  [name]        MRN#:  [id number]  Unit:   [facility] 4C        Room/Bed:  V409/W119.14    Time of treatment: Start Time: 0800 Stop Time: 0845  Time Calculation (min): 45 min  PT Received On: 07/04/14    Treatment #: PT Visit Number: 4/10    Precautions  Weight Bearing Status: RLE partial weight bearing  Weight Bearing Percent: 50  Total Knee Replacement: hinge brace;at all times (locked in extension)  Precaution Instructions Given to Patient: Yes (recalls 50% WB RLE)  MD orders for Exercises: active and passive in brace locked in extension    Medical Diagnosis:   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    Patient's medical condition is appropriate for Physical Therapy  intervention at this time.  Patient cleared by nurse to participate in PT session, nursing reports ready for PT.    ASSESSMENT   Pt with improved mobility and demos less assistance for mobility, fatigues easily but nausea from yesterday is not limiting factor. Pt encouraged to amb to bathroom with nursing to increase activity levels and will plan amb in hallways to increase distances next treatment, as able.  Dr. Christell Constant reports pt may be here until Monday due to culture results.    Goals per Eval/Re-eval:     Goals  Goal Formulation: With patient/family  Time for Goal Acheivement: 10 visits  Goals: Select goal  Pt Will Perform Sit to Stand: with stand by assist, Goal met (07/04/14)  Pt Will Transfer to Toilet: with stand by assist  Pt Will Ambulate: 31-50 feet, with stand by assist  Pt Will Go Up / Down Stairs: 3-5 stairs, with contact guard assist  Pt Will Perform Home Exer Program: with minimal assist  Pt Will Propel Wheelchair: 51-150 feet, modified independent  Pt Will Perform LE Dressing w/Device: with minimal  assist    PLAN     Recommendation  Discharge Recommendation: Home with home health PT  PT - Next Visit Recommendation: 07/05/14  PT Frequency: twice a day    Continue plan of care.    SUBJECTIVE   I'm feeling better today.  Patient is agreeable to participation in the therapy session. Nursing clears patient for therapy.     Pain Assessment  Pain Assessment: No/denies pain          OBJECTIVE     Patient is in bed seen 3 Days Post-Op with brace locked in extension.                                       Sensation is intact          Functional Mobility  Supine to Sit: Modified Independent  Scooting to EOB: Modified Independent  Sit to Stand: Stand by Assist  Stand to Sit: Stand by Assist         Locomotion  Ambulation: Contact Guard Assist  Ambulation Distance (Feet): 20 Feet  Pattern: decreased step length;decreased cadence;Step to    Therapeutic Exercise  Straight Leg Raise: 10  Quad Sets: 25  Glute Sets: 25  Hip Abduction: 25  Ankle Pumps: 25       Self Care  and Home Management:  Pt did not perform self care activities at this time.    Educated the patient to role of physical therapy, plan of care, goals  of therapy and Physical Therapy  Weight Bearing  Gait Techniques.    Patient continues to need focused education on Weight Bearing.    Patient is seated in a bedside chair with all needs provided and call bell within reach.       Signature: Mathews Argyle, PT 07/04/2014 8:48 AM

## 2014-07-04 NOTE — Progress Notes (Addendum)
INFECTIOUS DISEASES PROGRESS NOTE    Date Time: 07/04/2014 6:49 PM  Patient Name: [name],[name]      Assessment and Recommendations:     Patient Active Problem List   Diagnosis   . Total knee replacement status   . Low back pain   . Chronic obstructive pulmonary disease   . Hypertension   . Debility following multiple R knee revisions   . Non-healing surgical wound- right knee    . Fever   . Leukocytosis   . Anemia   . Hypokalemia   . Chronic infection of prosthetic knee   . COPD (chronic obstructive pulmonary disease)   . HTN (hypertension)   . Infection   . Osteoarthritis     78 YO FEMALE WITH HX COMPLICATED RIGHT KNEE   PJI and soft tissue infection   - proteus pji initially  -group c strep/esbl soft tissue infection associated with gastro flap   -gbs bacteremia 6/15 - source unclear - tx'd with iv thru 04/10/14      Hx c. Diff colitis     Nares mrsa colonization    READMITTED FOR RIGHT KNEE REIMPLANTATION.   ON 07/01/14    OR CXS REMAIN NEGATIVE SO FAR    Given that cxs remain negative and pt has no relaible   Iv access, will opt to change to oral abx -     augmentin 875 bid         IF CXS REMAIN NEGATIVE, ANTICIPATE [place] MON ON ORAL   AUGMENTIN     Subjective:     iN [employer]   Poor iv access    Antibiotics:   INVANZ   VANCO  - PER LEVELS     Medications:     Current Facility-Administered Medications   Medication Dose Route Frequency   . amLODIPine  5 mg Oral Daily   . aspirin EC  325 mg Oral Daily   . bisoprolol  5 mg Oral Daily   . cloNIDine  0.1 mg Oral Daily   . ertapenem  1,000 mg Intravenous Q24H   . Ferrous Sulfate  324 mg Oral QAM W/BREAKFAST   . gabapentin  300 mg Oral Daily   . multivitamin  1 tablet Oral Daily   . mupirocin   Nasal BID   . potassium chloride  20 mEq Oral BID   . saline  2 spray Each Nare BID   . sodium chloride (PF)  3 mL Intravenous Q8H   . tiotropium  18 mcg Inhalation QAM   . traMADol  50 mg Oral 4 times per day   . vitamin C  500 mg Oral BID       Central Access/Endovascular  Hardware:     Active PICC Line / CVC Line / PIV Line / Drain / Airway / Intraosseous Line / Epidural Line / ART Line / Line / Wound / Pressure Ulcer / NG/OG Tube     Name:   Placement date:   Placement time:   Site:   Days:    PICC Single Lumen 03/24/14 Right Basilic  03/24/14   1518   Basilic   102    Peripheral IV 07/01/14 Left Wrist  07/01/14   1339   Wrist   3    Incision Site 07/01/14 Knee Right  07/01/14   1615     3          Review of Systems:   Patient is without fevers, chills, , vomiting, diarrhea, ,  cough, dyspnea, headache, rash.  PAIN MUCH BETTER  NAUSEA, ABD DISCOMFORT RESOLVED     Physical Exam:     Filed Vitals:    07/03/14 2045 07/04/14 0411 07/04/14 0814 07/04/14 0941   BP: 145/63 180/77  135/59   Pulse: 82 86  83   Temp: 99.1 F (37.3 C) 99 F (37.2 C)  97.9 F (36.6 C)   TempSrc: Oral Oral     Resp: 18 18  18    Height:       Weight:       SpO2: 95% 95% 90% 96%       Temp (24hrs), Avg:98.7 F (37.1 C), Min:97.9 F (36.6 C), Max:99.1 F (37.3 C)        In no apparent distress  Sclera anicteric, conjunctivae clear  Oral pharynx s erythema, exudate, or other significant lesions  No Thrush  Neck supple, s lymphadenopathy  Lungs Clear  Heart Regular rate and rhythm s murmurs, rubs, or gallops  Abdomen Soft, non tender, non distended, normal bowel sounds  Extremities No Cyanosis, no clubbing, or edema  Palpable pedal pulses  Neurologic exam is grossly non-focal  Skin without rash     Labs:     Results     Procedure Component Value Units Date/Time    Vancomycin, random [[id number]] Collected:  07/04/14 1303    Specimen Information:  Blood Updated:  07/04/14 1429     Vancomycin Random 7.3 ug/mL      Vancomycin Date of Last Dose 07/02/2014     Narrative:      Drawn random level 07/04/14 at 1300;  Vancomycin currently on hold    Basic metabolic panel [[id number]]  (Abnormal) Collected:  07/04/14 0448    Specimen Information:  Blood Updated:  07/04/14 0548     Glucose 110 (H) mg/dL      BUN 18 mg/dL       Creatinine 0.8 mg/dL      CALCIUM 8.6 mg/dL      Sodium 295 mEq/[name]      Potassium 3.9 mEq/[name]      Chloride 111 mEq/[name]      CO2 24 mEq/[name]     GFR [[id number]] Collected:  07/04/14 0448     EGFR >60.0 Updated:  07/04/14 0548    Hemoglobin and hematocrit, blood [[id number]]  (Abnormal) Collected:  07/04/14 0448    Specimen Information:  Blood Updated:  07/04/14 0527     Hgb 9.1 ([name]) g/dL      Hematocrit 44.0 ([name]) %           Recent Labs  Lab 07/04/14  0448 07/03/14  0042   WBC  --  8.54   HEMOGLOBIN 9.1* 9.4*   HEMATOCRIT 28.8* 30.4*   PLATELETS  --  174        Recent Labs  Lab 07/04/14  0448 07/03/14  0042   SODIUM 145 142   POTASSIUM 3.9 3.8   CHLORIDE 111 108   CO2 24 24   BUN 18 20*   CREATININE 0.8 0.8   CALCIUM 8.6 8.4   ALBUMIN  --  2.8*   PROTEIN, TOTAL  --  6.4   BILIRUBIN, TOTAL  --  0.3   ALKALINE PHOSPHATASE  --  54   ALT  --  11   AST (SGOT)  --  14   GLUCOSE 110* 135*     No results for input(s): INR, APTT in the last 168 hours.  NOTE 05/31/14 - [facility] CX - DONE BY PA IN OFFICE,  SCANT DROP OF FLUID OBTAINED  FELT TO REPRESENT CONTAMINATION  (AND HAS NOT BEEN ISOLATED AT ANY OTHER TIME)          Rads:     Radiology Results (24 Hour)     ** No results found for the last 24 hours. **          Signed by: Ray Church, MD

## 2014-07-04 NOTE — Plan of Care (Signed)
Problem: Day 2 Post-op- Knee Surgery  Goal: Patient will maintain normal GI status (Post Op Day 2- Knee Surgery)  Outcome: Not Progressing  Pt with poor po intake emesis in am,stated on reglan ,ivf for 1l,had bm this evening

## 2014-07-04 NOTE — Progress Notes (Signed)
[facility] PROGRESS NOTE    Date Time: 07/04/2014 7:27 AM  Patient Name: Shimmel,Nastassia L        Subjective:   "I am feeling much better today."      Medications:      Scheduled Meds: PRN Meds:        amLODIPine 5 mg Oral Daily   aspirin EC 325 mg Oral Daily   bisoprolol 5 mg Oral Daily   cloNIDine 0.1 mg Oral Daily   ertapenem 1,000 mg Intravenous Q24H   Ferrous Sulfate 324 mg Oral QAM W/BREAKFAST   gabapentin 300 mg Oral Daily   multivitamin 1 tablet Oral Daily   mupirocin  Nasal BID   potassium chloride 20 mEq Oral BID   saline 2 spray Each Nare BID   sodium chloride (PF) 3 mL Intravenous Q8H   tiotropium 18 mcg Inhalation QAM   traMADol 50 mg Oral 4 times per day   vitamin C 500 mg Oral BID         Continuous Infusions:  . dextrose 5 % and 0.9% NaCl 100 mL/hr at 07/03/14 1512      cetirizine 10 mg QD PRN   HYDROcodone-acetaminophen 1 tablet Q4H PRN   HYDROcodone-acetaminophen 2 tablet Q4H PRN   HYDROmorphone 0.5 mg Q1H PRN   HYDROmorphone 1 mg Q1H PRN   hydrOXYzine 50 mg Q12H PRN   naloxone 0.1 mg PRN   ondansetron 4 mg Q8H PRN   senna-docusate 2 tablet BID PRN         I personally reviewed all of the medications      Review of Systems:   A comprehensive review of systems was obtained from chart review and the patient  General ROS: negative  Ophthalmic ROS: negative  Endocrine ROS: negative  Respiratory ROS: no cough, shortness of breath, or wheezing  Cardiovascular ROS: no chest pain or dyspnea on exertion  Gastrointestinal ROS: no abdominal pain, no nausea, vomiting today  Genito-Urinary ROS: no c/o, voiding without difficulty  Musculoskeletal ROS: Right knee pain -angina medications  Neurological ROS: no TIA or stroke symptoms, denies dizziness currently      Physical Exam:     Filed Vitals:    07/03/14 0834 07/03/14 1712 07/03/14 2045 07/04/14 0411   BP: 135/60 148/71 145/63 180/77   Pulse: 93 74 82 86   Temp:  97.7 F (36.5 C) 99.1 F (37.3 C) 99 F (37.2 C)   TempSrc:  Oral Oral Oral    Resp: 16 17 18 18    Height:       Weight:       SpO2: 91% 95% 95% 95%         Intake and Output Summary (Last 24 hours) at Date Time    Intake/Output Summary (Last 24 hours) at 07/04/14 1610  Last data filed at 07/04/14 0414   Gross per 24 hour   Intake   1050 ml   Output    500 ml   Net    550 ml       General appearance - alert, well appearing, and in no distress  Mental status - alert, oriented to person, place, and time  Chest - clear to auscultation, no wheezes, rales or rhonchi, symmetric air entry  Heart - normal rate, regular rhythm, normal S1, S2, no murmurs  Abdomen - bowel sounds normal, abdomen is soft, nontender, non distended, obese  Neurological - alert, oriented, normal speech, no focal findings or movement disorder noted  Musculoskeletal - currently sitting in chair, S/P therapy  Extremities - peripheral pulses normal, no pedal edema, no clubbing or cyanosis    Consultant note reviewed.    Labs:       Recent Labs  Lab 07/04/14  0448 07/03/14  0042 07/02/14  0456   GLUCOSE 110* 135* 120*   BUN 18 20* 14   CREATININE 0.8 0.8 0.7   CALCIUM 8.6 8.4 8.8   SODIUM 145 142 142   POTASSIUM 3.9 3.8 3.9   CHLORIDE 111 108 108   CO2 24 24 26    ALBUMIN  --  2.8*  --    EGFR >60.0 >60.0 >60.0         Recent Labs  Lab 07/03/14  0042   AST (SGOT) 14   ALT 11   ALKALINE PHOSPHATASE 54   ALBUMIN 2.8*   BILIRUBIN, TOTAL 0.3         Recent Labs  Lab 07/04/14  0448 07/03/14  0042 07/02/14  0456   WBC  --  8.54  --    HEMOGLOBIN 9.1* 9.4* 10.6*   HEMATOCRIT 28.8* 30.4* 34.9*   MCV  --  85.2  --    MCH, POC  --  26.3*  --    MCHC  --  30.9*  --    RDW  --  15  --    MPV  --  10.6  --        No results for input(s): TSH, FREET3 in the last 168 hours.    Invalid input(s): FREET4        Rads:     Radiology Results (24 Hour)     ** No results found for the last 24 hours. **              Assessment and Plan:      Patient Active Problem List   Diagnosis   . Total knee replacement status .  Hx of Chronic infection of  prosthetic knee.  S/P right TKA revision reimplantation 07/01/14 -continue plan per Ortho/ID  Deep vein thrombosis prophylaxis as per Ortho    . Low back pain   . Chronic obstructive pulmonary disease- no complaints of shortness of breath, currently stable    . Hypertension -blood pressure stable.  Continue medications.     Marland Kitchen Hx Non-healing surgical wound- right knee, poor wound healing-  On juven, Vit C and ensure.   . Acute Blood Loss Anemia, s/p surgery- continue iron and vitamin C    ESBL, MRSA hx -on isolation  Post nasal drip -continue saline nasal spray, will order Claritin when son brings it in from home. Currently has Zyrtec prn while inpatient.   PONV, constipation -resolved.  S/P IV fluids, Reglan and Dulcolax suppository.     Signed by: Sheryle Hail, NP

## 2014-07-04 NOTE — Progress Notes (Signed)
PROGRESS NOTE    Date Time: 07/04/2014 8:23 AM  Patient Name: [name], [name]      Assessment:    s/p R revision reimplantation TKA on  07/01/2014 doing well    Plan:   Continue PT - 50% WB RLE in HKB locked in extension at all times    Continue anticoagulation - ASA/foot pumps    High risk wound: nutrition support (boost, juven) and MVI+Vit C ordered    Wound care: drain removed and dressings changed 9/25, silver dressing placed, will check incision again 9/28    Abx: IV vanc + invanz per ID consult, cultures pending, patient to go home on po v iv abx per ID recs    Post op nausea/constipation: improved with anti-emetics, fluid, and suppository    D/c planning - home when clears PT and abx dispo determined    Subjective:   Pain controlled, tolerating diet, nausea yesterday improved this am, +BM overnight after suppository    Medications:     Current Facility-Administered Medications   Medication Dose Route Frequency   . amLODIPine  5 mg Oral Daily   . aspirin EC  325 mg Oral Daily   . bisoprolol  5 mg Oral Daily   . cloNIDine  0.1 mg Oral Daily   . ertapenem  1,000 mg Intravenous Q24H   . Ferrous Sulfate  324 mg Oral QAM W/BREAKFAST   . gabapentin  300 mg Oral Daily   . multivitamin  1 tablet Oral Daily   . mupirocin   Nasal BID   . potassium chloride  20 mEq Oral BID   . saline  2 spray Each Nare BID   . sodium chloride (PF)  3 mL Intravenous Q8H   . tiotropium  18 mcg Inhalation QAM   . traMADol  50 mg Oral 4 times per day   . vitamin C  500 mg Oral BID       Physical Exam:     Filed Vitals:    07/04/14 0411   BP: 180/77   Pulse: 86   Temp: 99 F (37.2 C)   Resp: 18   SpO2: 95%       Intake and Output Summary (Last 24 hours) at Date Time    Intake/Output Summary (Last 24 hours) at 07/04/14 0823  Last data filed at 07/04/14 0414   Gross per 24 hour   Intake   1050 ml   Output    500 ml   Net    550 ml       General appearance - no acute distress  Musculoskeletal -  Dressing C/D/I  Fires quad/TA/EHL/GSC  SILT  SP/DP/TN  WWP distally  Calves soft, nontender bilateral    Labs:     Results     Procedure Component Value Units Date/Time    Basic metabolic panel [[id number]]  (Abnormal) Collected:  07/04/14 0448    Specimen Information:  Blood Updated:  07/04/14 0548     Glucose 110 (H) mg/dL      BUN 18 mg/dL      Creatinine 0.8 mg/dL      CALCIUM 8.6 mg/dL      Sodium 696 mEq/L      Potassium 3.9 mEq/L      Chloride 111 mEq/L      CO2 24 mEq/L     GFR [[id number]] Collected:  07/04/14 0448     EGFR >60.0 Updated:  07/04/14 0548    Hemoglobin and hematocrit, blood [[id number]]  (  Abnormal) Collected:  07/04/14 0448    Specimen Information:  Blood Updated:  07/04/14 0527     Hgb 9.1 (L) g/dL      Hematocrit 16.1 (L) %     Wound culture & gram stain [[id number]] Collected:  07/01/14 1600    Specimen Information:  Wound / Tissue Updated:  07/03/14 1836    Narrative:      ORDER#: [id number]                                    ORDERED BY: Ahmed Prima, NITIN  SOURCE: Tissue Right Knee Tissue #2                  COLLECTED:  07/01/14 16:00  ANTIBIOTICS AT COLL.:                                RECEIVED :  07/01/14 18:27  Stain, Gram                                FINAL       07/01/14 21:27  07/01/14   No WBCs or organisms seen             No Squamous epithelial cells seen  Culture and Gram Stain, Aerobic, Wound     PRELIM      07/03/14 18:36  07/02/14   Culture no growth to date, Final report to follow  07/03/14   Culture no growth to date, Final report to follow      Wound culture & gram stain [[id number]] Collected:  07/01/14 1600    Specimen Information:  Wound / Tissue Updated:  07/03/14 1834    Narrative:      ORDER#: [id number]                                    ORDERED BY: Ahmed Prima, NITIN  SOURCE: Tissue Right Knee Tissue #3                  COLLECTED:  07/01/14 16:00  ANTIBIOTICS AT COLL.:                                RECEIVED :  07/01/14 18:26  Stain, Gram                                FINAL       07/01/14 21:31  07/01/14   Few WBCs              No organisms seen             No Squamous epithelial cells seen  Culture and Gram Stain, Aerobic, Wound     PRELIM      07/03/14 18:34  07/02/14   Culture no growth to date, Final report to follow  07/03/14   Culture no growth to date, Final report to follow      Wound culture & gram stain [[id number]] Collected:  07/01/14 1600    Specimen Information:  Wound / Tissue Updated:  07/03/14 1831    Narrative:  ORDER#: [id number]                                    ORDERED BY: Ahmed Prima, NITIN  SOURCE: Tissue Right Knee Tissue #1                  COLLECTED:  07/01/14 16:00  ANTIBIOTICS AT COLL.:                                RECEIVED :  07/01/14 18:21  Stain, Gram                                FINAL       07/01/14 21:35  07/01/14   No WBCs or organisms seen             No Squamous epithelial cells seen  Culture and Gram Stain, Aerobic, Wound     PRELIM      07/03/14 18:31  07/02/14   Culture no growth to date, Final report to follow  07/03/14   Culture no growth to date, Final report to follow      Vancomycin, random [[id number]] Collected:  07/03/14 1337    Specimen Information:  Blood Updated:  07/03/14 1501     Vancomycin Random 16.5 ug/mL      Vancomycin Time of Last Dose unk      Vancomycin Date of Last Dose 07/02/2014     MRSA culture [[id number]] Collected:  07/02/14 0659    Specimen Information:  Body Fluid / Nasal/Throat ASC Admission Updated:  07/03/14 1005    Narrative:      ORDER#: [id number]                                    ORDERED BY: Ahmed Prima, NITIN  SOURCE: Nares and Throat                             COLLECTED:  07/02/14 06:59  ANTIBIOTICS AT COLL.:                                RECEIVED :  07/02/14 13:18  Culture MRSA Surveillance                  FINAL       07/03/14 10:05  07/03/14   Negative for Methicillin Resistant Staph aureus from Nares and             Negative for Methicillin Resistant Staph aureus from Throat      Anaerobic culture [[id number]] Collected:  07/01/14 1600    Specimen Information:   Other / Tissue Updated:  07/03/14 0912    Narrative:      ORDER#: [id number]                                    ORDERED BY: Lane Hacker  SOURCE: Tissue Right Knee Tissue #3                  COLLECTED:  07/01/14 16:00  ANTIBIOTICS AT  COLL.:                                RECEIVED :  07/01/14 18:25  Culture, Anaerobic Bacteria                PRELIM      07/03/14 09:12  07/03/14   No growth to date, final report to follow      Anaerobic culture [[id number]] Collected:  07/01/14 1600    Specimen Information:  Other / Tissue Updated:  07/03/14 0912    Narrative:      ORDER#: [id number]                                    ORDERED BY: Ahmed Prima, NITIN  SOURCE: Tissue Right Knee Tissue #1                  COLLECTED:  07/01/14 16:00  ANTIBIOTICS AT COLL.:                                RECEIVED :  07/01/14 18:22  Culture, Anaerobic Bacteria                PRELIM      07/03/14 09:12  07/03/14   No growth to date, final report to follow      Anaerobic culture [[id number]] Collected:  07/01/14 1600    Specimen Information:  Other / Tissue Updated:  07/03/14 0912    Narrative:      ORDER#: [id number]                                    ORDERED BY: Ahmed Prima, NITIN  SOURCE: Tissue Right Knee Tissue #2                  COLLECTED:  07/01/14 16:00  ANTIBIOTICS AT COLL.:                                RECEIVED :  07/01/14 18:27  Culture, Anaerobic Bacteria                PRELIM      07/03/14 09:12  07/03/14   No growth to date, final report to follow            Physical Therapy:   Pain Score: 0-No pain (07/03/14 2315)  Weight Bearing Percent: 50 (07/03/14 1500)  Ambulation Distance (Feet): 20 Feet (07/02/14 1756)  Right Lower Extremity ROM: within functional limits (knee in IROM locked in extension) (07/02/14 1000)  Left Lower Extremity ROM: within functional limits (07/02/14 1000)              Rads:   Radiological Procedure reviewed.    Signed by: Collene Schlichter III

## 2014-07-04 NOTE — Plan of Care (Addendum)
Problem: Inadequate Gas Exchange  Goal: Adequately oxygenating and ventilation is improved  Outcome: Progressing  Hx of copd,no c/o of sob    Problem: Hemodynamic Status: Cardiac  Goal: Stable vital signs and fluid balance  Outcome: Progressing    Problem: Day 3 Post-op- Knee Surgery  Goal: Free from Infection  Outcome: Progressing  Final cx pending,afebrile,iv antibiotics d/c by Dr Dan Humphreys pt started on po amoxicillin  Goal: Pain at adequate level as identified by patient  Outcome: Progressing  Pt states pain level 6/10 relieved by norco  Goal: Nutritional Status Improving  Outcome: Progressing  No further nausea,had bm 9/25 and 9/26  Goal: Mobility/activity is maintained at optimum level for patient  Outcome: Progressing

## 2014-07-05 LAB — BASIC METABOLIC PANEL
BUN: 11 mg/dL (ref 7–19)
CO2: 24 mEq/L (ref 22–29)
Calcium: 8.6 mg/dL (ref 7.9–10.2)
Chloride: 111 mEq/L (ref 100–111)
Creatinine: 0.7 mg/dL (ref 0.6–1.0)
Glucose: 97 mg/dL (ref 70–100)
Potassium: 3.9 mEq/L (ref 3.5–5.1)
Sodium: 144 mEq/L (ref 136–145)

## 2014-07-05 LAB — GFR: EGFR: >60

## 2014-07-05 LAB — HEMOGLOBIN AND HEMATOCRIT, BLOOD
Hematocrit: 27.2 % — ABNORMAL LOW (ref 37.0–47.0)
Hgb: 8.6 g/dL — ABNORMAL LOW (ref 12.0–16.0)

## 2014-07-05 NOTE — Progress Notes (Signed)
PROGRESS NOTE    Date Time: 07/05/2014 8:31 AM  Patient Name: [name], [name]      Assessment:    s/p R revision reimplantation TKA on  07/01/2014 doing well    Plan:   Continue PT - 50% WB RLE in HKB locked in extension at all times    Continue anticoagulation - ASA/foot pumps    High risk wound: nutrition support (boost, juven) and MVI+Vit C ordered    Wound care: drain removed and dressings changed 9/25, silver dressing placed, will check incision again 9/28    Abx: IV vanc + invanz per ID consult, cultures pending, patient to go home on po v iv abx per ID recs    Post op nausea/constipation: improved with anti-emetics, fluid, and suppository    D/c planning - home when clears PT and abx dispo determined    Subjective:   Pain controlled, tolerating diet    Medications:     Current Facility-Administered Medications   Medication Dose Route Frequency   . amLODIPine  5 mg Oral Daily   . amoxicillin-clavulanate  1 tablet Oral Q12H SCH   . aspirin EC  325 mg Oral Daily   . bisoprolol  5 mg Oral Daily   . cloNIDine  0.1 mg Oral Daily   . Ferrous Sulfate  324 mg Oral QAM W/BREAKFAST   . gabapentin  300 mg Oral Daily   . multivitamin  1 tablet Oral Daily   . mupirocin   Nasal BID   . potassium chloride  20 mEq Oral BID   . saline  2 spray Each Nare BID   . sodium chloride (PF)  3 mL Intravenous Q8H   . tiotropium  18 mcg Inhalation QAM   . traMADol  50 mg Oral 4 times per day   . vitamin C  500 mg Oral BID       Physical Exam:     Filed Vitals:    07/05/14 0545   BP: 139/65   Pulse: 84   Temp: 99 F (37.2 C)   Resp: 18   SpO2: 98%       Intake and Output Summary (Last 24 hours) at Date Time    Intake/Output Summary (Last 24 hours) at 07/05/14 0831  Last data filed at 07/04/14 1521   Gross per 24 hour   Intake    250 ml   Output      0 ml   Net    250 ml       General appearance - no acute distress  Musculoskeletal -  Dressing C/D/I  Fires quad/TA/EHL/GSC  SILT SP/DP/TN  WWP distally  Calves soft, nontender bilateral    Labs:      Results     Procedure Component Value Units Date/Time    Basic metabolic panel [[id number]] Collected:  07/05/14 0439    Specimen Information:  Blood Updated:  07/05/14 0550     Glucose 97 mg/dL      BUN 11 mg/dL      Creatinine 0.7 mg/dL      CALCIUM 8.6 mg/dL      Sodium 518 mEq/L      Potassium 3.9 mEq/L      Chloride 111 mEq/L      CO2 24 mEq/L     GFR [[id number]] Collected:  07/05/14 0439     EGFR >60.0 Updated:  07/05/14 0550    Hemoglobin and hematocrit, blood [[id number]]  (Abnormal) Collected:  07/05/14 0439  Specimen Information:  Blood Updated:  07/05/14 0518     Hgb 8.6 (L) g/dL      Hematocrit 95.6 (L) %     RBC OR HOLD Blood Product [[id number]] Collected:  07/01/14 1200     RBC Leukoreduced [id number]    released Updated:  07/05/14 0041    Narrative:      ?Special Requirements->No special requirements  ?Surgery/ Procedure (to be Performed)->Right knee revision  ?Surgery/Procedure Date:->07/01/14  ?Is the patient pregnant?->No  ?Has the patient been transfused w/i the last 3 months?->No    Vancomycin, random [[id number]] Collected:  07/04/14 1303    Specimen Information:  Blood Updated:  07/04/14 1429     Vancomycin Random 7.3 ug/mL      Vancomycin Date of Last Dose 07/02/2014     Narrative:      Drawn random level 07/04/14 at 1300;  Vancomycin currently on hold          Physical Therapy:   Pain Score: 0-No pain (07/05/14 3664)  Weight Bearing Percent: 50 (07/04/14 0800)  Ambulation Distance (Feet): 20 Feet (07/04/14 0800)  Right Lower Extremity ROM: within functional limits (knee in IROM locked in extension) (07/02/14 1000)  Left Lower Extremity ROM: within functional limits (07/02/14 1000)              Rads:   Radiological Procedure reviewed.    Signed by: Rudi Coco

## 2014-07-05 NOTE — Progress Notes (Signed)
[facility] PROGRESS NOTE    Date Time: 07/05/2014 10:08 AM  Patient Name: [name], [name]        Subjective:   Pt seen and examined.  Doing well.  No complaints today.       Medications:      Scheduled Meds: PRN Meds:        amLODIPine 5 mg Oral Daily   amoxicillin-clavulanate 1 tablet Oral Q12H [facility]   aspirin EC 325 mg Oral Daily   bisoprolol 5 mg Oral Daily   cloNIDine 0.1 mg Oral Daily   Ferrous Sulfate 324 mg Oral QAM W/BREAKFAST   gabapentin 300 mg Oral Daily   multivitamin 1 tablet Oral Daily   mupirocin  Nasal BID   potassium chloride 20 mEq Oral BID   saline 2 spray Each Nare BID   sodium chloride (PF) 3 mL Intravenous Q8H   tiotropium 18 mcg Inhalation QAM   traMADol 50 mg Oral 4 times per day   vitamin C 500 mg Oral BID         Continuous Infusions:  . dextrose 5 % and 0.9% NaCl 100 mL/hr at 07/03/14 1512      cetirizine 10 mg QD PRN   HYDROcodone-acetaminophen 1 tablet Q4H PRN   HYDROcodone-acetaminophen 2 tablet Q4H PRN   HYDROmorphone 0.5 mg Q1H PRN   HYDROmorphone 1 mg Q1H PRN   hydrOXYzine 50 mg Q12H PRN   naloxone 0.1 mg PRN   ondansetron 4 mg Q8H PRN   senna-docusate 2 tablet BID PRN         I personally reviewed all of the medications      Review of Systems:   A comprehensive review of systems was obtained from chart review and the patient  General ROS: negative  Ophthalmic ROS: negative  Endocrine ROS: negative  Respiratory ROS: no cough, shortness of breath, or wheezing  Cardiovascular ROS: no chest pain or dyspnea on exertion  Gastrointestinal ROS: no abdominal pain, no nausea, vomiting today  Genito-Urinary ROS: no c/o, voiding without difficulty  Musculoskeletal ROS: Right knee pain -angina medications  Neurological ROS: no TIA or stroke symptoms, denies dizziness currently      Physical Exam:     Filed Vitals:    07/04/14 0941 07/04/14 1950 07/05/14 0545 07/05/14 0905   BP: 135/59 164/70 139/65 148/65   Pulse: 83 83 84 90   Temp: 97.9 F (36.6 C) 97.9 F (36.6 C) 99 F (37.2  C) 97.3 F (36.3 C)   TempSrc:  Oral     Resp: 18 18 18 18    Height:       Weight:       SpO2: 96% 95% 98% 98%         Intake and Output Summary (Last 24 hours) at Date Time    Intake/Output Summary (Last 24 hours) at 07/05/14 1008  Last data filed at 07/04/14 1521   Gross per 24 hour   Intake    250 ml   Output      0 ml   Net    250 ml       General appearance - alert, well appearing, and in no distress  Mental status - alert, oriented to person, place, and time  Chest - clear to auscultation, no wheezes, rales or rhonchi, symmetric air entry  Heart - normal rate, regular rhythm, normal S1, S2, no murmurs  Abdomen - bowel sounds normal, abdomen is soft, nontender, non distended, obese  Neurological - alert, oriented, normal speech, no focal findings or movement disorder noted  Musculoskeletal - currently sitting in chair  Extremities - no pedal edema, no clubbing or cyanosis    Consultant note reviewed.    Labs:       Recent Labs  Lab 07/05/14  0439 07/04/14  0448 07/03/14  0042   GLUCOSE 97 110* 135*   BUN 11 18 20*   CREATININE 0.7 0.8 0.8   CALCIUM 8.6 8.6 8.4   SODIUM 144 145 142   POTASSIUM 3.9 3.9 3.8   CHLORIDE 111 111 108   CO2 24 24 24    ALBUMIN  --   --  2.8*   EGFR >60.0 >60.0 >60.0         Recent Labs  Lab 07/03/14  0042   AST (SGOT) 14   ALT 11   ALKALINE PHOSPHATASE 54   ALBUMIN 2.8*   BILIRUBIN, TOTAL 0.3         Recent Labs  Lab 07/05/14  0439 07/04/14  0448 07/03/14  0042   WBC  --   --  8.54   HEMOGLOBIN 8.6* 9.1* 9.4*   HEMATOCRIT 27.2* 28.8* 30.4*   MCV  --   --  85.2   MCH, POC  --   --  26.3*   MCHC  --   --  30.9*   RDW  --   --  15   MPV  --   --  10.6       No results for input(s): TSH, FREET3 in the last 168 hours.    Invalid input(s): FREET4        Rads:     Radiology Results (24 Hour)     ** No results found for the last 24 hours. **              Assessment and Plan:      Patient Active Problem List   Diagnosis   . Total knee replacement status .  Hx of Chronic infection of prosthetic  knee.  S/P right TKA revision reimplantation 07/01/14 -continue plan per Ortho/ID, has been changed to PO abx by ID  Deep vein thrombosis prophylaxis as per Ortho    . Low back pain   . Chronic obstructive pulmonary disease- no complaints of shortness of breath, currently stable    . Hypertension -blood pressure stable.  Continue medications.     Marland Kitchen Hx Non-healing surgical wound- right knee, poor wound healing-  On juven, Vit C and ensure.   . Acute Blood Loss Anemia, s/p surgery- continue iron and vitamin C    ESBL, MRSA hx -on isolation  Post nasal drip -continue saline nasal spray, will order Claritin when son brings it in from home. Currently has Zyrtec prn while inpatient.   PONV, constipation -resolved.  S/P IV fluids, Reglan and Dulcolax suppository.     Likely d/c tomorrow per ortho once abx recs final (length of Augmentin course per ID)    Signed by: Loma Sousa, DO

## 2014-07-05 NOTE — PT Progress Note (Signed)
Physical Therapy Treatment    Patient:  [name]        MRN#:  [id number]  Unit:  [place] [facility] 4C        Room/Bed:  V409/W119.14    Time of treatment: Start Time: 0900 Stop Time: 0945  Time Calculation (min): 45 min  PT Received On: 07/05/14    Treatment #: PT Visit Number: 5/10    Precautions  Weight Bearing Status: RLE partial weight bearing  Weight Bearing Percent: 50  Total Knee Replacement: hinge brace;at all times (locked in extension)  Precaution Instructions Given to Patient: Yes  MD orders for Exercises: active and passive in brace locked in extension    Medical Diagnosis:   Infection and inflammatory reaction due to internal joint prosthesis, initial encounter [782.95, V43.60]  Surgical Procedure:Right   Procedure(s):  ARTHROPLASTY, KNEE, REVISION TOTAL performed by  Lane Hacker, MD on 07/01/2014.    Patient's medical condition is appropriate for Physical Therapy  intervention at this time.  Patient cleared by nurse to participate in PT session, nursing reports patient is ready for therapy, just medicated..    ASSESSMENT   S/p TKA Revision by Dr. Ahmed Prima.  Patient is pleased with outcome so far.   Increased distance ambulated.    Goals per Eval/Re-eval:     Goals  Goal Formulation: With patient/family  Time for Goal Acheivement: 10 visits  Goals: Select goal  Pt Will Perform Sit to Stand: with stand by assist, Goal met (07/04/14)  Pt Will Transfer to Toilet: with stand by assist  Pt Will Ambulate: 31-50 feet, with stand by assist  Pt Will Go Up / Down Stairs: 3-5 stairs, with contact guard assist  Pt Will Perform Home Exer Program: with minimal assist  Pt Will Propel Wheelchair: 51-150 feet, modified independent  Pt Will Perform LE Dressing w/Device: with minimal assist    PLAN     Recommendation  Discharge Recommendation: Home with home health PT  PT - Next Visit Recommendation: 07/06/14  PT Frequency: twice a day    Continue plan of care.    SUBJECTIVE   I am so happy I can do  more.  Patient is agreeable to participation in the therapy session.     Pain Assessment  Pain Assessment: Numeric Scale (0-10)  Pain Score: 3-mild pain  Patient's Stated Comfort Functional Goal: 4-moderate pain          OBJECTIVE     Patient is in bed seen 4 Days Post-Op with dressings and telemetry.                                       Sensation is intact          Functional Mobility  Supine to Sit: Supervision  Scooting to EOB: Stand by Assist  Sit to Stand: Contact Guard Assist  Stand to Sit: Contact Guard Assist         Locomotion  Ambulation: Contact Guard Assist;with front-wheeled walker  Ambulation Distance (Feet): 20 Feet (x 3)  Pattern: Wide BOS;Step through;decreased step length;decreased cadence    Therapeutic Exercise  Straight Leg Raise: 10  Quad Sets: 25  Glute Sets: 25  Hip Abduction: 25  Ankle Pumps: 25       Self Care and Home Management:  Pt did not perform self care activities at this time.    Educated the  patient to role of physical therapy, plan of care, goals  of therapy and Precautions  Weight Bearing  Gait Techniques  Transfer Techniques  Leg Exercises.    Patient continues to need focused education on Precautions  Weight Bearing  Gait Techniques  Transfer Techniques  Leg Exercises.    Patient is seated in wheelchair  with all needs provided and call bell within reach. RN notified of session outcome, including Patient progressing well.  She is hoping to go home on Monday 07/06/14..        Signature: Louann Liv, PTA 07/05/2014 9:46 AM

## 2014-07-05 NOTE — Plan of Care (Signed)
Problem: Safety  Goal: Patient will be free from injury during hospitalization  Outcome: Progressing    Problem: Pain  Goal: Patient's pain/discomfort is manageable  Outcome: Progressing    Problem: Impaired Mobility  Goal: Mobility/activity is maintained at optimum level for patient  Outcome: Progressing  Patient moves slow but able to transfer to [facility] with walker with standby assist        Problem: Inadequate Gas Exchange  Goal: Adequately oxygenating and ventilation is improved  Outcome: Progressing    Problem: Hemodynamic Status: Cardiac  Goal: Stable vital signs and fluid balance  Outcome: Progressing

## 2014-07-06 LAB — HEMOGLOBIN AND HEMATOCRIT, BLOOD
Hematocrit: 24.5 % — ABNORMAL LOW (ref 37.0–47.0)
Hgb: 7.8 g/dL — ABNORMAL LOW (ref 12.0–16.0)

## 2014-07-06 LAB — BASIC METABOLIC PANEL
BUN: 12 mg/dL (ref 7–19)
CO2: 26 mEq/L (ref 22–29)
Calcium: 8.5 mg/dL (ref 7.9–10.2)
Chloride: 110 mEq/L (ref 100–111)
Creatinine: 0.7 mg/dL (ref 0.6–1.0)
Glucose: 88 mg/dL (ref 70–100)
Potassium: 4 mEq/L (ref 3.5–5.1)
Sodium: 144 mEq/L (ref 136–145)

## 2014-07-06 LAB — GFR: EGFR: >60

## 2014-07-06 MED ORDER — AMOXICILLIN 875 MG PO TABS
875.0000 mg | ORAL_TABLET | Freq: Two times a day (BID) | ORAL | Status: DC
Start: 2014-07-06 — End: 2018-01-24

## 2014-07-06 NOTE — Discharge Instructions (Signed)
CASE MANAGEMENT DISCHARGE SUMMARY    Right Knee Revision 07/08/14   Continue individual exercise program at home   Home Health Physical Therapy 2 times per wk,   Provider:[facility] ph: [phone]  RN-Monitor incision  RN- will draw CBC on 07/13/14 and send results to PCP Dr Janalyn Rouse [phone]   Notify MD if there is drainage from the incision after post op day 5.   Assess pain level every 2 to 4 hours and medicate accordingly.   Contact MD for Rx refills Ice and elevate to reduce swelling & discomfort.      No driving until OK with MD   50 % weight bearing using device.   Use walker or two crutches until follow-up visit with Dr Ahmed Prima    [facility], [phone] for an appointment in two weeks for a wound check.    Case Manager, [facility] [phone]          [facility] Hip and Knee Replacement Discharge Instructions    SWELLING  It is normal to have some swelling in your lower legs after surgery.  Walking every hour and doing your exercises will help strengthen your muscles and resolve the swelling.  If you have swelling, we recommend that you lie down every two hours, elevate your legs with pillows, and apply ice for 15 minutes.  If the swelling does not go away overnight, please call your doctors office.    SHOWERS  You are permitted to shower at home as long as your incision is clean and intact.  If you have a clear plastic dressing, leave that in place for 10 days and just let the water run over the dressing. If you have a cloth or gauze dressing, remove it before the shower and replace it after the shower.  Do not soak in a tub or swim until you doctor says it's okay.    ACTIVITY  You will be using an assistive device ( walker, crutches or cane). Your physical therapist will help you with this. Most patients are able to get in and out of bed, use the restroom and go up and down stairs when they go home. It's a good idea to have someone with you  in case you need help for the first week. We recommend that you get up and move around every 1-1.5 hours while you are awake. This will help with stiffness and reduce the chance of blood clots.    PAIN  You may continue to have pain or soreness for several weeks after surgery.  Gradually taper the frequency and amount of narcotic pain medication you are taking based on the amount of pain or soreness you have.  Extra strength acetaminophen can be used but should not exceed 4000mg  in one day.  Apply ice to the incision for 15 minutes after activity to help ease soreness.    MEDICATIONS  Most patients are discharged with several prescriptions. There are 2 main categories, pain pills and pills to help prevent blood clots.  Pain pills - take these if you need them. When taking narcotic pain medications, be sure to drink plenty of water and use stool softeners, such as Pericolace or Colace, as these pain medications can be constipating.  Blood Thinners - could be aspirin, coumadin, lovenox or mechanical pumps. Make sure you take the medicine properly or use the pumps as prescribed.  Vitron C - this is an over the counter medication that will help  to build back your blood count after surgery. We recommend this for most patients.    CALLING THE OFFICE  Call the doctor if you have any additional questions or concerns. After hours one of the fellows that helped with the surgery will return your call. During working hours the staff can answer simple questions. If you have concerns not addressed to your satisfaction by the staff then ask for your doctor's nurse to return your call.      Ascorbic Acid (Vitamin C), Ferrous Sulfate Oral tablet, extended-release  What is this medicine?  IRON (AHY ern) replaces iron that is essential to healthy red blood cells. Iron is used to treat iron deficiency anemia. Anemia may cause problems like tiredness, shortness of breath, or slowed growth in children. Only take iron if your doctor has  told you to. Do not treat yourself with iron if you are feeling tired. Most healthy people get enough iron in their diets, particularly if they eat cereals, meat, poultry, and fish.  This medicine may be used for other purposes; ask your health care provider or pharmacist if you have questions.  What should I tell my health care provider before I take this medicine?  They need to know if you have any of these conditions:   frequently drink alcohol   bowel disease   hemolytic anemia   iron overload (hemochromatosis, hemosiderosis)   liver disease   problems with swallowing   stomach ulcer or other stomach problems   an unusual or allergic reaction to iron, other medicines, foods, dyes, or preservatives   pregnant or trying to get pregnant   breast-feeding  How should I use this medicine?  Take this medicine by mouth with a glass of water or fruit juice. Follow the directions on the prescription label. Swallow whole. Do not crush or chew. Take this medicine in an upright or sitting position. Try to take any bedtime doses at least 10 minutes before lying down. You may take this medicine with food. Take your medicine at regular intervals. Do not take your medicine more often than directed. Do not stop taking except on your doctor's advice.  Talk to your pediatrician regarding the use of this medicine in children. While this drug may be prescribed for selected conditions, precautions do apply.  Overdosage: If you think you have taken too much of this medicine contact a poison control center or emergency room at once.  NOTE: This medicine is only for you. Do not share this medicine with others.  What if I miss a dose?  If you miss a dose, take it as soon as you can. If it is almost time for your next dose, take only that dose. Do not take double or extra doses.  What may interact with this medicine?  If you are taking this iron product, you should not take iron in any other medicine or dietary supplement.  This  medicine may also interact with the following medications:   alendronate   antacids   cefdinir   chloramphenicol   cholestyramine   deferoxamine   dimercaprol   etidronate   medicines for stomach ulcers or other stomach problems   pancreatic enzymes   quinolone antibiotics (examples: Cipro, Floxin, Levaquin, Tequin and others)   risedronate   tetracycline antibiotics (examples: doxycycline, tetracycline, minocycline, and others)   thyroid hormones  This list may not describe all possible interactions. Give your health care provider a list of all the medicines, herbs, non-prescription drugs, or  dietary supplements you use. Also tell them if you smoke, drink alcohol, or use illegal drugs. Some items may interact with your medicine.  What should I watch for while using this medicine?  Use iron supplements only as directed by your health care professional. Bonita Quin will need important blood work while you are taking this medicine. It may take 3 to 6 months of therapy to treat low iron levels. Pregnant women should follow the dose and length of iron treatment as directed by their doctors.  Do not use iron longer than prescribed, and do not take a higher dose than recommended. Long-term use may cause excess iron to build-up in the body.  Do not take iron with antacids. If you need to take an antacid, take it 2 hours after a dose of iron.  What side effects may I notice from receiving this medicine?  Side effects that you should report to your doctor or health care professional as soon as possible:   allergic reactions like skin rash, itching or hives, swelling of the face, lips, or tongue   blue lips, nails, or palms   dark colored stools (this may be due to the iron, but can indicate a more serious condition)   drowsiness   pain with or difficulty swallowing   pale or clammy skin   seizures   stomach pain   unusually weak or tired   vomiting   weak, fast, or irregular heartbeat  Side effects that  usually do not require medical attention (report to your doctor or health care professional if they continue or are bothersome):   constipation   indigestion   nausea or stomach upset  This list may not describe all possible side effects. Call your doctor for medical advice about side effects. You may report side effects to FDA at 1-800-FDA-1088.  Where should I keep my medicine?  Keep out of the reach of children. Even small amounts of iron can be harmful to a child.  Store at room temperature between 15 and 30 degrees C (59 and 86 degrees F). Keep container tightly closed. Throw away any unused medicine after the expiration date.  NOTE:This sheet is a summary. It may not cover all possible information. If you have questions about this medicine, talk to your doctor, pharmacist, or health care provider. Copyright 2015 Gold Standard    Amoxicillin Trihydrate Oral capsule  What is this medicine?  AMOXICILLIN (a mox i SIL in) is a penicillin antibiotic. It is used to treat certain kinds of bacterial infections. It will not work for colds, flu, or other viral infections.  This medicine may be used for other purposes; ask your health care provider or pharmacist if you have questions.  What should I tell my health care provider before I take this medicine?  They need to know if you have any of these conditions:   asthma   kidney disease   an unusual or allergic reaction to amoxicillin, other penicillins, cephalosporin antibiotics, other medicines, foods, dyes, or preservatives   pregnant or trying to get pregnant   breast-feeding  How should I use this medicine?  Take this medicine by mouth with a glass of water. Follow the directions on your prescription label. You may take this medicine with food or on an empty stomach. Take your medicine at regular intervals. Do not take your medicine more often than directed. Take all of your medicine as directed even if you think your are better. Do not skip doses or  stop  your medicine early.  Talk to your pediatrician regarding the use of this medicine in children. While this drug may be prescribed for selected conditions, precautions do apply.  Overdosage: If you think you have taken too much of this medicine contact a poison control center or emergency room at once.  NOTE: This medicine is only for you. Do not share this medicine with others.  What if I miss a dose?  If you miss a dose, take it as soon as you can. If it is almost time for your next dose, take only that dose. Do not take double or extra doses.  What may interact with this medicine?   amiloride   birth control pills   chloramphenicol   macrolides   probenecid   sulfonamides   tetracyclines  This list may not describe all possible interactions. Give your health care provider a list of all the medicines, herbs, non-prescription drugs, or dietary supplements you use. Also tell them if you smoke, drink alcohol, or use illegal drugs. Some items may interact with your medicine.  What should I watch for while using this medicine?  Tell your doctor or health care professional if your symptoms do not improve in 2 or 3 days. Take all of the doses of your medicine as directed. Do not skip doses or stop your medicine early.  If you are diabetic, you may get a false positive result for sugar in your urine with certain brands of urine tests. Check with your doctor.  Do not treat diarrhea with over-the-counter products. Contact your doctor if you have diarrhea that lasts more than 2 days or if the diarrhea is severe and watery.  What side effects may I notice from receiving this medicine?  Side effects that you should report to your doctor or health care professional as soon as possible:   allergic reactions like skin rash, itching or hives, swelling of the face, lips, or tongue   breathing problems   dark urine   redness, blistering, peeling or loosening of the skin, including inside the mouth   seizures   severe or  watery diarrhea   trouble passing urine or change in the amount of urine   unusual bleeding or bruising   unusually weak or tired   yellowing of the eyes or skin  Side effects that usually do not require medical attention (report to your doctor or health care professional if they continue or are bothersome):   dizziness   headache   stomach upset   trouble sleeping  This list may not describe all possible side effects. Call your doctor for medical advice about side effects. You may report side effects to FDA at 1-800-FDA-1088.  Where should I keep my medicine?  Keep out of the reach of children.  Store between 68 and 77 degrees F (20 and 25 degrees C). Keep bottle closed tightly. Throw away any unused medicine after the expiration date.  NOTE:This sheet is a summary. It may not cover all possible information. If you have questions about this medicine, talk to your doctor, pharmacist, or health care provider. Copyright 2015 Gold Standard      Celecoxib Oral capsule  What is this medicine?  CELECOXIB (sell a KOX ib) is a non-steroidal anti-inflammatory drug (NSAID). This medicine is used to treat arthritis and ankylosing spondylitis. It may be also used for pain or painful monthly periods.  This medicine may be used for other purposes; ask your health care provider or  pharmacist if you have questions.  What should I tell my health care provider before I take this medicine?  They need to know if you have any of these conditions:   asthma   coronary artery bypass graft (CABG) surgery within the past 2 weeks   drink more than 3 alcohol-containing drinks a day   heart disease or circulation problems like heart failure or leg edema (fluid retention)   high blood pressure   kidney disease   liver disease   stomach bleeding or ulcers   an unusual or allergic reaction to celecoxib, sulfa drugs, aspirin, other NSAIDs, other medicines, foods, dyes, or preservatives   pregnant or trying to get  pregnant   breast-feeding  How should I use this medicine?  Take this medicine by mouth with a full glass of water. Follow the directions on the prescription label. Take it with food if it upsets your stomach or if you take 400 mg at one time. Try to not lie down for at least 10 minutes after you take the medicine. Take the medicine at the same time each day. Do not take more medicine than you are told to take. Long-term, continuous use may increase the risk of heart attack or stroke.  A special MedGuide will be given to you by the pharmacist with each prescription and refill. Be sure to read this information carefully each time.  Talk to your pediatrician regarding the use of this medicine in children. Special care may be needed.  Overdosage: If you think you have taken too much of this medicine contact a poison control center or emergency room at once.  NOTE: This medicine is only for you. Do not share this medicine with others.  What if I miss a dose?  If you miss a dose, take it as soon as you can. If it is almost time for your next dose, take only that dose. Do not take double or extra doses.  What may interact with this medicine?  Do not take this medicine with any of the following medications:   cidofovir   methotrexate   other NSAIDs, medicines for pain and inflammation, like ibuprofen or naproxen   pemetrexed  This medicine may also interact with the following medications:   alcohol   aspirin and aspirin-like drugs   diuretics   fluconazole   lithium   medicines for high blood pressure   steroid medicines like prednisone or cortisone   warfarin  This list may not describe all possible interactions. Give your health care provider a list of all the medicines, herbs, non-prescription drugs, or dietary supplements you use. Also tell them if you smoke, drink alcohol, or use illegal drugs. Some items may interact with your medicine.  What should I watch for while using this medicine?  Tell your  doctor or health care professional if your pain does not get better. Talk to your doctor before taking another medicine for pain. Do not treat yourself.  This medicine does not prevent heart attack or stroke. In fact, this medicine may increase the chance of a heart attack or stroke. The chance may increase with longer use of this medicine and in people who have heart disease. If you take aspirin to prevent heart attack or stroke, talk with your doctor or health care professional.   Do not take medicines such as ibuprofen and naproxen with this medicine. Side effects such as stomach upset, nausea, or ulcers may be more likely to occur. Many medicines  available without a prescription should not be taken with this medicine.  This medicine can cause ulcers and bleeding in the stomach and intestines at any time during treatment. Ulcers and bleeding can happen without warning symptoms and can cause death.  What side effects may I notice from receiving this medicine?  Side effects that you should report to your doctor or health care professional as soon as possible:   allergic reactions like skin rash, itching or hives, swelling of the face, lips, or tongue   black or bloody stools, blood in the urine or vomit   blurred vision   breathing problems   chest pain   nausea, vomiting   problems with balance, talking, walking   redness, blistering, peeling or loosening of the skin, including inside the mouth   unexplained weight gain or swelling   unusually weak or tired   yellowing of eyes, skin  Side effects that usually do not require medical attention (report to your doctor or health care professional if they continue or are bothersome):   constipation or diarrhea   dizziness   gas or heartburn   upset stomach  This list may not describe all possible side effects. Call your doctor for medical advice about side effects. You may report side effects to FDA at 1-800-FDA-1088.  Where should I keep my  medicine?  Keep out of the reach of children.  Store at room temperature between 15 and 30 degrees C (59 and 86 degrees F). Keep container tightly closed. Throw away any unused medicine after the expiration date.  NOTE:This sheet is a summary. It may not cover all possible information. If you have questions about this medicine, talk to your doctor, pharmacist, or health care provider. Copyright 2015 Gold Standard    Docusate Sodium Oral capsule  What is this medicine?  DOCUSATE (doc CUE sayt) is stool softener. It helps prevent constipation and straining or discomfort associated with hard or dry stools.  This medicine may be used for other purposes; ask your health care provider or pharmacist if you have questions.  What should I tell my health care provider before I take this medicine?  They need to know if you have any of these conditions:   nausea or vomiting   severe constipation   stomach pain   sudden change in bowel habit lasting more than 2 weeks   an unusual or allergic reaction to docusate, other medicines, foods, dyes, or preservatives   pregnant or trying to get pregnant   breast-feeding  How should I use this medicine?  Take this medicine by mouth with a glass of water. Follow the directions on the label. Take your doses at regular intervals. Do not take your medicine more often than directed.  Talk to your pediatrician regarding the use of this medicine in children. While this medicine may be prescribed for children as young as 2 years for selected conditions, precautions do apply.  Overdosage: If you think you have taken too much of this medicine contact a poison control center or emergency room at once.  NOTE: This medicine is only for you. Do not share this medicine with others.  What if I miss a dose?  If you miss a dose, take it as soon as you can. If it is almost time for your next dose, take only that dose. Do not take double or extra doses.  What may interact with this  medicine?   mineral oil  This list may not describe  all possible interactions. Give your health care provider a list of all the medicines, herbs, non-prescription drugs, or dietary supplements you use. Also tell them if you smoke, drink alcohol, or use illegal drugs. Some items may interact with your medicine.  What should I watch for while using this medicine?  Do not use for more than one week without advice from your doctor or health care professional. If your constipation returns, check with your doctor or health care professional.  Drink plenty of water while taking this medicine. Drinking water helps decrease constipation.  Stop using this medicine and contact your doctor or health care professional if you experience any rectal bleeding or do not have a bowel movement after use. These could be signs of a more serious condition.  What side effects may I notice from receiving this medicine?  Side effects that you should report to your doctor or health care professional as soon as possible:   allergic reactions like skin rash, itching or hives, swelling of the face, lips, or tongue  Side effects that usually do not require medical attention (report to your doctor or health care professional if they continue or are bothersome):   diarrhea   stomach cramps   throat irritation  This list may not describe all possible side effects. Call your doctor for medical advice about side effects. You may report side effects to FDA at 1-800-FDA-1088.  Where should I keep my medicine?  Keep out of the reach of children.  Store at room temperature between 15 and 30 degrees C (59 and 86 degrees F). Throw away any unused medicine after the expiration date.  NOTE:This sheet is a summary. It may not cover all possible information. If you have questions about this medicine, talk to your doctor, pharmacist, or health care provider. Copyright 2015 Gold Standard      Hydrocodone Bitartrate, Acetaminophen Oral tablet  What is this  medicine?  ACETAMINOPHEN; HYDROCODONE (a set a MEE noe fen; hye droe KOE done) is a pain reliever. It is used to treat mild to moderate pain.  This medicine may be used for other purposes; ask your health care provider or pharmacist if you have questions.  What should I tell my health care provider before I take this medicine?  They need to know if you have any of these conditions:   brain tumor   Crohn's disease, inflammatory bowel disease, or ulcerative colitis   drink more than 3 alcohol-containing drinks per day   drug abuse or addiction   head injury   heart or circulation problems   kidney disease or problems going to the bathroom   liver disease   lung disease, asthma, or breathing problems   an unusual or allergic reaction to acetaminophen, hydrocodone, other opioid analgesics, other medicines, foods, dyes, or preservatives   pregnant or trying to get pregnant   breast-feeding  How should I use this medicine?  Take this medicine by mouth. Swallow it with a full glass of water. Follow the directions on the prescription label. If the medicine upsets your stomach, take the medicine with food or milk. Do not take more than you are told to take.   Talk to your pediatrician regarding the use of this medicine in children. This medicine is not approved for use in children.  Overdosage: If you think you have taken too much of this medicine contact a poison control center or emergency room at once.  NOTE: This medicine is only for you. Do  not share this medicine with others.  What if I miss a dose?  If you miss a dose, take it as soon as you can. If it is almost time for your next dose, take only that dose. Do not take double or extra doses.  What may interact with this medicine?   alcohol   antihistamines   isoniazid   medicines for depression, anxiety, or psychotic disturbances   medicines for sleep   muscle relaxants   naltrexone   narcotic medicines (opiates) for  pain   phenobarbital   ritonavir   tramadol  This list may not describe all possible interactions. Give your health care provider a list of all the medicines, herbs, non-prescription drugs, or dietary supplements you use. Also tell them if you smoke, drink alcohol, or use illegal drugs. Some items may interact with your medicine.  What should I watch for while using this medicine?  Tell your doctor or health care professional if your pain does not go away, if it gets worse, or if you have new or a different type of pain. You may develop tolerance to the medicine. Tolerance means that you will need a higher dose of the medicine for pain relief. Tolerance is normal and is expected if you take the medicine for a long time.  Do not suddenly stop taking your medicine because you may develop a severe reaction. Your body becomes used to the medicine. This does NOT mean you are addicted. Addiction is a behavior related to getting and using a drug for a non-medical reason. If you have pain, you have a medical reason to take pain medicine. Your doctor will tell you how much medicine to take. If your doctor wants you to stop the medicine, the dose will be slowly lowered over time to avoid any side effects.  You may get drowsy or dizzy when you first start taking the medicine or change doses. Do not drive, use machinery, or do anything that may be dangerous until you know how the medicine affects you. Stand or sit up slowly.  There are different types of narcotic medicines (opiates) for pain. If you take more than one type at the same time, you may have more side effects. Give your health care provider a list of all medicines you use. Your doctor will tell you how much medicine to take. Do not take more medicine than directed. Call emergency for help if you have problems breathing.  The medicine will cause constipation. Try to have a bowel movement at least every 2 to 3 days. If you do not have a bowel movement for 3 days,  call your doctor or health care professional.  Too much acetaminophen can be very dangerous. Do not take Tylenol (acetaminophen) or medicines that contain acetaminophen with this medicine. Many non-prescription medicines contain acetaminophen. Always read the labels carefully.  What side effects may I notice from receiving this medicine?  Side effects that you should report to your doctor or health care professional as soon as possible:   allergic reactions like skin rash, itching or hives, swelling of the face, lips, or tongue   breathing problems   confusion   feeling faint or lightheaded, falls   stomach pain   yellowing of the eyes or skin  Side effects that usually do not require medical attention (report to your doctor or health care professional if they continue or are bothersome):   nausea, vomiting   stomach upset  This list may not describe  all possible side effects. Call your doctor for medical advice about side effects. You may report side effects to FDA at 1-800-FDA-1088.  Where should I keep my medicine?  Keep out of the reach of children. This medicine can be abused. Keep your medicine in a safe place to protect it from theft. Do not share this medicine with anyone. Selling or giving away this medicine is dangerous and against the law.  Store at room temperature between 15 and 30 degrees C (59 and 86 degrees F). Protect from light. Keep container tightly closed.  Throw away any unused medicine after the expiration date. Discard unused medicine and used packaging carefully. Pets and children can be harmed if they find used or lost packages.  NOTE: This sheet is a summary. It may not cover all possible information. If you have questions about this medicine, talk to your doctor, pharmacist, or health care provider.  NOTE:This sheet is a summary. It may not cover all possible information. If you have questions about this medicine, talk to your doctor, pharmacist, or health care provider.  Copyright 2015 Gold Standard      Tramadol Hydrochloride Oral tablet  What is this medicine?  TRAMADOL (TRA ma dole) is a pain reliever. It is used to treat moderate to severe pain in adults.  This medicine may be used for other purposes; ask your health care provider or pharmacist if you have questions.  What should I tell my health care provider before I take this medicine?  They need to know if you have any of these conditions:   brain tumor   depression   drug abuse or addiction   head injury   if you frequently drink alcohol containing drinks   kidney disease or trouble passing urine   liver disease   lung disease, asthma, or breathing problems   seizures or epilepsy   suicidal thoughts, plans, or attempt; a previous suicide attempt by you or a family member   an unusual or allergic reaction to tramadol, codeine, other medicines, foods, dyes, or preservatives   pregnant or trying to get pregnant   breast-feeding  How should I use this medicine?  Take this medicine by mouth with a full glass of water. Follow the directions on the prescription label. If the medicine upsets your stomach, take it with food or milk. Do not take more medicine than you are told to take.  Talk to your pediatrician regarding the use of this medicine in children. Special care may be needed.  Overdosage: If you think you have taken too much of this medicine contact a poison control center or emergency room at once.  NOTE: This medicine is only for you. Do not share this medicine with others.  What if I miss a dose?  If you miss a dose, take it as soon as you can. If it is almost time for your next dose, take only that dose. Do not take double or extra doses.  What may interact with this medicine?  Do not take this medicine with any of the following medications:   MAOIs like Carbex, Eldepryl, Marplan, Nardil, and Parnate  This medicine may also interact with the following medications:   alcohol or medicines that contain  alcohol   antihistamines   benzodiazepines   bupropion   carbamazepine or oxcarbazepine   clozapine   cyclobenzaprine   digoxin   furazolidone   linezolid   medicines for depression, anxiety, or psychotic disturbances   medicines for  migraine headache like almotriptan, eletriptan, frovatriptan, naratriptan, rizatriptan, sumatriptan, zolmitriptan   medicines for pain like pentazocine, buprenorphine, butorphanol, meperidine, nalbuphine, and propoxyphene   medicines for sleep   muscle relaxants   naltrexone   phenobarbital   phenothiazines like perphenazine, thioridazine, chlorpromazine, mesoridazine, fluphenazine, prochlorperazine, promazine, and trifluoperazine   procarbazine   warfarin  This list may not describe all possible interactions. Give your health care provider a list of all the medicines, herbs, non-prescription drugs, or dietary supplements you use. Also tell them if you smoke, drink alcohol, or use illegal drugs. Some items may interact with your medicine.  What should I watch for while using this medicine?  Tell your doctor or health care professional if your pain does not go away, if it gets worse, or if you have new or a different type of pain. You may develop tolerance to the medicine. Tolerance means that you will need a higher dose of the medicine for pain relief. Tolerance is normal and is expected if you take this medicine for a long time.  Do not suddenly stop taking your medicine because you may develop a severe reaction. Your body becomes used to the medicine. This does NOT mean you are addicted. Addiction is a behavior related to getting and using a drug for a non-medical reason. If you have pain, you have a medical reason to take pain medicine. Your doctor will tell you how much medicine to take. If your doctor wants you to stop the medicine, the dose will be slowly lowered over time to avoid any side effects.  You may get drowsy or dizzy. Do not drive, use machinery, or  do anything that needs mental alertness until you know how this medicine affects you. Do not stand or sit up quickly, especially if you are an older patient. This reduces the risk of dizzy or fainting spells. Alcohol can increase or decrease the effects of this medicine. Avoid alcoholic drinks.  You may have constipation. Try to have a bowel movement at least every 2 to 3 days. If you do not have a bowel movement for 3 days, call your doctor or health care professional.   Your mouth may get dry. Chewing sugarless gum or sucking hard candy, and drinking plenty of water may help. Contact your doctor if the problem does not go away or is severe.  What side effects may I notice from receiving this medicine?  Side effects that you should report to your doctor or health care professional as soon as possible:   allergic reactions like skin rash, itching or hives, swelling of the face, lips, or tongue   breathing difficulties, wheezing   confusion   itching   light headedness or fainting spells   redness, blistering, peeling or loosening of the skin, including inside the mouth   seizures  Side effects that usually do not require medical attention (report to your doctor or health care professional if they continue or are bothersome):   constipation   dizziness   drowsiness   headache   nausea, vomiting  This list may not describe all possible side effects. Call your doctor for medical advice about side effects. You may report side effects to FDA at 1-800-FDA-1088.  Where should I keep my medicine?  Keep out of the reach of children.  Store at room temperature between 15 and 30 degrees C (59 and 86 degrees F). Keep container tightly closed. Throw away any unused medicine after the expiration date.  NOTE:This sheet  is a summary. It may not cover all possible information. If you have questions about this medicine, talk to your doctor, pharmacist, or health care provider. Copyright 2015 Gold Standard

## 2014-07-06 NOTE — Plan of Care (Signed)
Problem: Health Promotion  Goal: Knowledge - health resources  Extent of understanding and conveyed about healthcare resources.   Intervention: Discharge planning  CM met with pt and family to discuss [place] plan  Pt plans to [place] to Home   Transportation by family   PT will address equipment needs  Home Care- [facility]  Pt agreeable with plan

## 2014-07-06 NOTE — PT Progress Note (Signed)
Physical Therapy Note  Physical Therapy Treatment    Patient:  [name]        MRN#:  [id number]  Unit:  [place] [facility]        Room/Bed:  M447/M447.01    Time of treatment: Start Time: 1015 Stop Time: 1045  Time Calculation (min): 30 min  PT Received On: 07/06/14    Treatment #: PT Visit Number: 6/10    Precautions  Weight Bearing Status: RLE partial weight bearing  Weight Bearing Percent: 50  Total Knee Replacement: hinge brace;at all times  Precaution Instructions Given to Patient: Yes  MD orders for Exercises: knee brace locked in extension.    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    Patient's medical condition is appropriate for Physical Therapy  intervention at this time.  Patient cleared by nurse to participate in PT session, nursing reports.    ASSESSMENT   Pt presents with decreased endurance, SOB, decreased mobility skills, knee brace locked in extension.    Goals per Eval/Re-eval:     Goals  Goal Formulation: With patient/family  Time for Goal Acheivement: 10 visits  Goals: Select goal  Pt Will Perform Sit to Stand: with stand by assist, Goal met  Pt Will Transfer to Toilet: with stand by assist  Pt Will Ambulate: 31-50 feet, with stand by assist  Pt Will Go Up / Down Stairs: 3-5 stairs, with contact guard assist  Pt Will Perform Home Exer Program: with minimal assist  Pt Will Propel Wheelchair: 51-150 feet, modified independent  Pt Will Perform LE Dressing w/Device: with minimal assist    PLAN     Recommendation  Discharge Recommendation: Home with home health PT  PT - Next Visit Recommendation: 07/07/14  PT Frequency: 7x/wk    Continue plan of care.    SUBJECTIVE   "I will walk."  Patient is agreeable to participation in the therapy session.  Patient  Goal: walk, drive  Pain Assessment  Pain Assessment: No/denies pain          OBJECTIVE     Patient is seated in wheelchair in room seen 5 Days Post-Op with knee brace locked in extension.    Gross ROM  Right Upper Extremity ROM: within functional limits  Left Upper Extremity ROM: within functional limits  Right Lower Extremity ROM: unable to assess  Left Lower Extremity ROM: within functional limits                                  Sensation is intact     Cognition  Arousal/Alertness: Appropriate responses to stimuli  Attention Span: Appears intact  Orientation Level: Oriented X4  Memory: Appears intact  Following Commands: Follows all commands and directions without difficulty  Safety Awareness: independent  Insights: Fully aware of deficits  Problem Solving: Able to problem solve independently    Functional Mobility  Scooting to [facility]: Unable to assess (Comment) (in w/c)  Scooting to EOB: Unable to assess (Comment)  Sit to Supine: Unable to assess (Comment)  Sit to Stand: Risk analyst  Stand to Sit: Contact Guard Assist    Transfers  Bed to Chair: Minimal Assist  Stand Pivot Transfers: Minimal Assist    Locomotion  Ambulation: Contact Guard Assist;with front-wheeled walker  Ambulation Distance (Feet):  (25' x 1)  Pattern: Wide BOS;decreased step length;decreased cadence  Weight Bearing Observed: PWB (50%)            Self Care and Home Management:  Pt did not perform self care activities at this time.    Educated the patient to role of physical therapy, plan of care, goals  of therapy and Weight Bearing  Gait Techniques.    Patient continues to need focused education on Weight Bearing  Gait Techniques  Transfer Techniques.    Patient is seated in wheelchair in room with all needs provided and call bell within reach. RN notified of session outcome.        Signature: Lorenz Coaster, PT 07/06/2014 10:59 AM

## 2014-07-06 NOTE — PT Progress Note (Signed)
Physical Therapy Note    Physical Therapy Treatment    Patient:  [name]        MRN#:  [id number]  Unit:  [place] [facility]        Room/Bed:  M447/M447.01    Time of treatment: Start Time: 0915 Stop Time: 0945  Time Calculation (min): 30 min  PT Received On: 07/06/14    Treatment #: PT Visit Number: 6/10    Precautions  Weight Bearing Status: RLE partial weight bearing  Weight Bearing Percent: 50  Total Knee Replacement: hinge brace;at all times  Precaution Instructions Given to Patient: Yes  MD orders for Exercises:  Knee brace locked in extension RLE    Medical Diagnosis:   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    Patient's medical condition is appropriate for Physical Therapy  intervention at this time.  Patient cleared by nurse to participate in PT session, nursing reports.    ASSESSMENT   Pt presents with decreased mobility skills, decreased endurance, knee brace locked in extension.      Goals per Eval/Re-eval:     Goals  Goal Formulation: With patient/family  Time for Goal Acheivement: 10 visits  Goals: Select goal  Pt Will Perform Sit to Stand: with stand by assist, Goal met  Pt Will Transfer to Toilet: with stand by assist  Pt Will Ambulate: 31-50 feet, with stand by assist  Pt Will Go Up / Down Stairs: 3-5 stairs, with contact guard assist  Pt Will Perform Home Exer Program: with minimal assist  Pt Will Propel Wheelchair: 51-150 feet, modified independent  Pt Will Perform LE Dressing w/Device: with minimal assist    PLAN     Recommendation  Discharge Recommendation: Home with home health PT  PT - Next Visit Recommendation: 07/07/14  PT Frequency: 7x/wk    Continue plan of care.    SUBJECTIVE   "I will walk."  Patient is agreeable to participation in the therapy session.  Patient  Goal: walk, drive  Pain Assessment  Pain Assessment: No/denies pain          OBJECTIVE     Patient is seated in a wheel chair bedside seen 5 Days Post-Op with knee brace locked in extension.    Gross ROM  Right Upper Extremity ROM: within functional limits  Left Upper Extremity ROM: within functional limits  Right Lower Extremity ROM: unable to assess (knee brace locked in extension)  Left Lower Extremity ROM: within functional limits                                  Sensation is intact     Cognition  Arousal/Alertness: Appropriate responses to stimuli  Attention Span: Appears intact  Orientation Level: Oriented X4  Memory: Appears intact  Following Commands: Follows all commands and directions without difficulty  Safety Awareness: independent  Insights: Fully aware of deficits  Problem Solving: Able to problem solve independently    Functional Mobility  Scooting to [facility]: Unable to assess (Comment) (in w/c)  Scooting to EOB: Unable to assess (Comment)  Sit to Supine: Unable to assess (Comment)  Sit to Stand: Contact Guard Assist  Stand to Sit: Contact Guard Assist    Transfers  Bed to Chair: Minimal  Assist  Stand Pivot Transfers: Minimal Assist    Locomotion  Ambulation: Contact Guard Assist;with front-wheeled walker  Ambulation Distance (Feet):  (25' x 1)  Pattern: Wide BOS;decreased cadence;decreased step length  Weight Bearing Observed: PWB (50% RLE)            Self Care and Home Management:  Pt did not perform self care activities at this time.    Educated the patient to role of physical therapy, plan of care, goals  of therapy and Gait Techniques  Transfer Techniques.    Patient continues to need focused education on Weight Bearing  Gait Techniques  Transfer Techniques.    Patient is seated in a wheel chair bedside with all needs provided and call bell within reach. RN notified of session outcome.        Signature: Lorenz Coaster, PT 07/06/2014 10:35 AM

## 2014-07-06 NOTE — Plan of Care (Signed)
Problem: Day of Surgery- Knee Surgery  Goal: Address patient self-management plan (DOS: Knee)  Intervention: Promote Lifestyle Changes/Support Self-Manage Activities  Patient states lifestyle goals are go back home to [facility].  Continue treatment plan, offer resources as appropriate.

## 2014-07-06 NOTE — Plan of Care (Signed)
Problem: Safety  Goal: Patient will be free from injury during hospitalization  Discharge instruction given to patient and daughter, verbalized understanding, d/c homr via w/c in stable condition

## 2014-07-06 NOTE — Progress Notes (Signed)
Infectious Diseases  Progress Note     Impression:   89 yof a/f right knee arthroplasty having suffered previous PJI. See earlier notes. All Cx are negative. Hgb drifting down.  Pt has h/o C diff and this serves as an additional risk to monitor.  B/R/A D/W Pt and team       Recommendations:   OK to d/c home on Amoxicillin ( note not Augmentin) 875 mg BID for 30 days and 2 refills.  Dr. Dan Humphreys would like to see in office 3-4 weeks.   Plan is decreasing dose to daily when wound has healed at which time the suppression can go to once a day indefinitely.   Labs can be done around the time of f/u visit.  Have asked Med to address low Hgb      Subjective:   No new ID problems    Review of Systems  Patient without nausea, vomiting, diarrhea, rash, cough, shortness of breath, abdominal or chest pain.      Objective:   BP 146/65 mmHg  Pulse 79  Temp(Src) 97.2 F (36.2 C) (Oral)  Resp 20  Ht 1.575 m (5\' 2" )  Wt 94.8 kg (208 lb 15.9 oz)  BMI 38.22 kg/m2  SpO2 97%    Temp (24hrs), Avg:98 F (36.7 C), Min:97.2 F (36.2 C), Max:98.8 F (37.1 C)      General: awake, alert, oriented x 3; no acute distress.  Cardiovascular: regular rate and rhythm, no murmurs, rubs or gallops  Lungs: clear to auscultation bilaterally, without wheezing, rhonchi, or rales  Extremities: no clubbing, cyanosis,   Other: incision clean, no drg per surgery        Labs:    Recent Labs  Lab 07/06/14  0526 07/05/14  0439 07/04/14  0448 07/03/14  0042   WBC  --   --   --  8.54   HEMOGLOBIN 7.8* 8.6* 9.1* 9.4*   HEMATOCRIT 24.5* 27.2* 28.8* 30.4*   PLATELETS  --   --   --  174       Recent Labs  Lab 07/06/14  0526 07/05/14  0439 07/04/14  0448 07/03/14  0042   SODIUM 144 144 145 142   POTASSIUM 4.0 3.9 3.9 3.8   CHLORIDE 110 111 111 108   CO2 26 24 24 24    BUN 12 11 18  20*   CREATININE 0.7 0.7 0.8 0.8   CALCIUM 8.5 8.6 8.6 8.4   ALBUMIN  --   --   --  2.8*   PROTEIN, TOTAL  --   --   --  6.4   BILIRUBIN, TOTAL  --   --   --  0.3   ALKALINE  PHOSPHATASE  --   --   --  54   ALT  --   --   --  11   AST (SGOT)  --   --   --  14   GLUCOSE 88 97 110* 135*

## 2014-07-06 NOTE — Plan of Care (Signed)
Problem: Day 3 Post-op- Knee Surgery  Goal: Hemodynamic Stability  Outcome: Progressing  Pt VS stable. Dressing to right knee dry and intact with IROM brace in place.   Goal: Stable Neurovascular Status  Outcome: Progressing  Pt is able to wiggle toes, has good pedal pulses, brisk cap refill to both lower extremities.Will continue to monitor.       Goal: Free from Infection  Outcome: Progressing  Goal: Pain at adequate level as identified by patient  Outcome: Progressing  Pt had complaints of pain, medicated with norco PRN with good effects. Pain is manageable. Will continue to monitor.        Goal: Nutritional Status Improving  Outcome: Progressing  Pt has BM this shift. Loose stool. Pt request no more stool softeners. Will continue to monitor.      Goal: Mobility/activity is maintained at optimum level for patient  Outcome: Progressing  Pt out of bed. Ambulates to BR via walker with assistance. Will continue to monitor.

## 2014-07-06 NOTE — Progress Notes (Signed)
PROGRESS NOTE    Date Time: 07/06/2014 6:14 AM  Patient Name: [name], [name]      Assessment:    s/p R revision reimplantation TKA on  07/01/2014 doing well    Plan:   Continue PT - 50% WB RLE in [facility] locked in extension at all times    Continue anticoagulation - ASA/foot pumps    High risk wound: nutrition support (boost, juven) and MVI+Vit C ordered    Wound care: drain removed and dressings changed 9/25+9/28    Abx: po Augmentin per ID, cultures pending, patient to go home on po v iv abx per ID recs    Post op nausea/constipation: improved with anti-emetics, fluid, and suppository    D/c planning - home when clears PT and abx dispo determined    Subjective:   Pain controlled, tolerating diet    Medications:     Current Facility-Administered Medications   Medication Dose Route Frequency   . amLODIPine  5 mg Oral Daily   . amoxicillin-clavulanate  1 tablet Oral Q12H SCH   . aspirin EC  325 mg Oral Daily   . bisoprolol  5 mg Oral Daily   . cloNIDine  0.1 mg Oral Daily   . Ferrous Sulfate  324 mg Oral QAM W/BREAKFAST   . gabapentin  300 mg Oral Daily   . multivitamin  1 tablet Oral Daily   . mupirocin   Nasal BID   . potassium chloride  20 mEq Oral BID   . saline  2 spray Each Nare BID   . sodium chloride (PF)  3 mL Intravenous Q8H   . tiotropium  18 mcg Inhalation QAM   . traMADol  50 mg Oral 4 times per day   . vitamin C  500 mg Oral BID       Physical Exam:     Filed Vitals:    07/05/14 1950   BP: 142/60   Pulse: 83   Temp: 98.8 F (37.1 C)   Resp: 16   SpO2: 97%       Intake and Output Summary (Last 24 hours) at Date Time    Intake/Output Summary (Last 24 hours) at 07/06/14 5409  Last data filed at 07/05/14 1500   Gross per 24 hour   Intake      0 ml   Output    303 ml   Net   -303 ml       General appearance - no acute distress  Musculoskeletal -  Incision C/D/I  Fires quad/TA/EHL/GSC  SILT SP/DP/TN  WWP distally  Calves soft, nontender bilateral    Labs:     Results     Procedure Component Value Units Date/Time     Hemoglobin and hematocrit, blood [[id number]]  (Abnormal) Collected:  07/06/14 0526    Specimen Information:  Blood Updated:  07/06/14 0604     Hgb 7.8 (L) g/dL      Hematocrit 95.6 (L) %     Basic metabolic panel [[id number]] Collected:  07/06/14 0526    Specimen Information:  Blood Updated:  07/06/14 0552    Wound culture & gram stain [[id number]] Collected:  07/01/14 1600    Specimen Information:  Wound / Tissue Updated:  07/05/14 2204    Narrative:      ORDER#: [id number]  ORDERED BY: GOYAL, NITIN  SOURCE: Tissue Right Knee Tissue #1                  COLLECTED:  07/01/14 16:00  ANTIBIOTICS AT COLL.:                                RECEIVED :  07/01/14 18:21  Stain, Gram                                FINAL       07/01/14 21:35  07/01/14   No WBCs or organisms seen             No Squamous epithelial cells seen  Culture and Gram Stain, Aerobic, Wound     PRELIM      07/05/14 22:04  07/02/14   Culture no growth to date, Final report to follow  07/03/14   Culture no growth to date, Final report to follow  07/05/14   Culture no growth to date, Final report to follow             Culture no growth to date, Final report to follow      Wound culture & gram stain [[id number]] Collected:  07/01/14 1600    Specimen Information:  Wound / Tissue Updated:  07/05/14 2203    Narrative:      ORDER#: [id number]                                    ORDERED BY: Ahmed Prima, NITIN  SOURCE: Tissue Right Knee Tissue #3                  COLLECTED:  07/01/14 16:00  ANTIBIOTICS AT COLL.:                                RECEIVED :  07/01/14 18:26  Stain, Gram                                FINAL       07/01/14 21:31  07/01/14   Few WBCs             No organisms seen             No Squamous epithelial cells seen  Culture and Gram Stain, Aerobic, Wound     PRELIM      07/05/14 22:03  07/02/14   Culture no growth to date, Final report to follow  07/03/14   Culture no growth to date, Final report to follow  07/05/14   Culture  no growth to date, Final report to follow             Culture no growth to date, Final report to follow      Wound culture & gram stain [[id number]] Collected:  07/01/14 1600    Specimen Information:  Wound / Tissue Updated:  07/05/14 2202    Narrative:      ORDER#: [id number]  ORDERED BY: GOYAL, NITIN  SOURCE: Tissue Right Knee Tissue #2                  COLLECTED:  07/01/14 16:00  ANTIBIOTICS AT COLL.:                                RECEIVED :  07/01/14 18:27  Stain, Gram                                FINAL       07/01/14 21:27  07/01/14   No WBCs or organisms seen             No Squamous epithelial cells seen  Culture and Gram Stain, Aerobic, Wound     PRELIM      07/05/14 22:02  07/02/14   Culture no growth to date, Final report to follow  07/03/14   Culture no growth to date, Final report to follow  07/05/14   Culture no growth to date, Final report to follow             Culture no growth to date, Final report to follow      Anaerobic culture [[id number]] Collected:  07/01/14 1600    Specimen Information:  Other / Tissue Updated:  07/05/14 1213    Narrative:      ORDER#: [id number]                                    ORDERED BY: Ahmed Prima, NITIN  SOURCE: Tissue Right Knee Tissue #1                  COLLECTED:  07/01/14 16:00  ANTIBIOTICS AT COLL.:                                RECEIVED :  07/01/14 18:22  Culture, Anaerobic Bacteria                FINAL       07/05/14 12:13  07/05/14   No anaerobic growth      Anaerobic culture [[id number]] Collected:  07/01/14 1600    Specimen Information:  Other / Tissue Updated:  07/05/14 1212    Narrative:      ORDER#: [id number]                                    ORDERED BY: Ahmed Prima, NITIN  SOURCE: Tissue Right Knee Tissue #2                  COLLECTED:  07/01/14 16:00  ANTIBIOTICS AT COLL.:                                RECEIVED :  07/01/14 18:27  Culture, Anaerobic Bacteria                FINAL       07/05/14 12:12  07/05/14   No anaerobic growth       Anaerobic culture [[id number]] Collected:  07/01/14 1600    Specimen Information:  Other / Tissue Updated:  07/05/14 1211    Narrative:      ORDER#: [id number]                                    ORDERED BY: Lane Hacker  SOURCE: Tissue Right Knee Tissue #3                  COLLECTED:  07/01/14 16:00  ANTIBIOTICS AT COLL.:                                RECEIVED :  07/01/14 18:25  Culture, Anaerobic Bacteria                FINAL       07/05/14 12:11  07/05/14   No anaerobic growth            Physical Therapy:   Pain Score: 2-mild pain (07/06/14 0027)  Weight Bearing Percent: 50 (07/05/14 0900)  Ambulation Distance (Feet): 20 Feet (x 3) (07/05/14 0900)  Right Lower Extremity ROM: within functional limits (knee in IROM locked in extension) (07/02/14 1000)  Left Lower Extremity ROM: within functional limits (07/02/14 1000)              Rads:   Radiological Procedure reviewed.    Signed by: Collene Schlichter III

## 2014-07-06 NOTE — Plan of Care (Signed)
Problem: Safety  Goal: Patient will be free from injury during hospitalization  Outcome: Adequate for Discharge    Problem: Pain  Goal: Patient's pain/discomfort is manageable  Outcome: Adequate for Discharge    Problem: Impaired Mobility  Goal: Mobility/activity is maintained at optimum level for patient  Outcome: Adequate for Discharge  Ambulates slow but with steady gait and tol well, IROM brace remains in placed    Problem: Inadequate Gas Exchange  Goal: Adequately oxygenating and ventilation is improved  Outcome: Adequate for Discharge    Problem: Hemodynamic Status: Cardiac  Goal: Stable vital signs and fluid balance  Outcome: Adequate for Discharge

## 2014-07-06 NOTE — Progress Notes (Addendum)
[facility] PROGRESS NOTE    Date Time: 07/06/2014 8:43 AM  Patient Name: [name]        Subjective:   "I'm feeling good."       Medications:      Scheduled Meds: PRN Meds:        amLODIPine 5 mg Oral Daily   amoxicillin-clavulanate 1 tablet Oral Q12H [facility]   aspirin EC 325 mg Oral Daily   bisoprolol 5 mg Oral Daily   cloNIDine 0.1 mg Oral Daily   Ferrous Sulfate 324 mg Oral QAM W/BREAKFAST   gabapentin 300 mg Oral Daily   multivitamin 1 tablet Oral Daily   mupirocin  Nasal BID   potassium chloride 20 mEq Oral BID   saline 2 spray Each Nare BID   sodium chloride (PF) 3 mL Intravenous Q8H   tiotropium 18 mcg Inhalation QAM   traMADol 50 mg Oral 4 times per day   vitamin C 500 mg Oral BID         Continuous Infusions:  . dextrose 5 % and 0.9% NaCl 100 mL/hr at 07/03/14 1512      cetirizine 10 mg QD PRN   HYDROcodone-acetaminophen 1 tablet Q4H PRN   HYDROcodone-acetaminophen 2 tablet Q4H PRN   HYDROmorphone 0.5 mg Q1H PRN   HYDROmorphone 1 mg Q1H PRN   hydrOXYzine 50 mg Q12H PRN   naloxone 0.1 mg PRN   ondansetron 4 mg Q8H PRN   senna-docusate 2 tablet BID PRN         I personally reviewed all of the medications      Review of Systems:   A comprehensive review of systems was obtained from chart review and the patient  General ROS: negative  Ophthalmic ROS: negative  Endocrine ROS: negative  Respiratory ROS: no cough, shortness of breath, or wheezing  Cardiovascular ROS: no chest pain or dyspnea on exertion  Gastrointestinal ROS: no abdominal pain, no nausea, vomiting today  Genito-Urinary ROS: no c/o, voiding without difficulty  Musculoskeletal ROS: Right knee pain -managed with medications  Neurological ROS: no TIA or stroke symptoms, denies dizziness       Physical Exam:     Filed Vitals:    07/05/14 0545 07/05/14 0905 07/05/14 1806 07/05/14 1950   BP: 139/65 148/65 130/58 142/60   Pulse: 84 90 82 83   Temp: 99 F (37.2 C) 97.3 F (36.3 C) 98.1 F (36.7 C) 98.8 F (37.1 C)   TempSrc:        Resp: 18 18 20 16    Height:       Weight:       SpO2: 98% 98%  97%         Intake and Output Summary (Last 24 hours) at Date Time    Intake/Output Summary (Last 24 hours) at 07/06/14 0843  Last data filed at 07/05/14 1500   Gross per 24 hour   Intake      0 ml   Output    303 ml   Net   -303 ml       General appearance - alert, well appearing, and in no distress  Mental status - alert, oriented to person, place, and time  Chest - clear to auscultation, no wheezes, rales or rhonchi, symmetric air entry  Heart - normal rate, regular rhythm, normal S1, S2, no murmurs  Abdomen - bowel sounds normal, abdomen is soft, nontender, non distended, obese  Neurological - alert, oriented, normal speech, no focal  findings or movement disorder noted  Musculoskeletal - currently sitting in chair, s/p therapy  Extremities - no pedal edema, no clubbing or cyanosis    Consultant note reviewed.    Labs:       Recent Labs  Lab 07/06/14  0526 07/05/14  0439 07/04/14  0448 07/03/14  0042   GLUCOSE 88 97 110* 135*   BUN 12 11 18  20*   CREATININE 0.7 0.7 0.8 0.8   CALCIUM 8.5 8.6 8.6 8.4   SODIUM 144 144 145 142   POTASSIUM 4.0 3.9 3.9 3.8   CHLORIDE 110 111 111 108   CO2 26 24 24 24    ALBUMIN  --   --   --  2.8*   EGFR >60.0 >60.0 >60.0 >60.0         Recent Labs  Lab 07/03/14  0042   AST (SGOT) 14   ALT 11   ALKALINE PHOSPHATASE 54   ALBUMIN 2.8*   BILIRUBIN, TOTAL 0.3         Recent Labs  Lab 07/06/14  0526 07/05/14  0439 07/04/14  0448 07/03/14  0042   WBC  --   --   --  8.54   HEMOGLOBIN 7.8* 8.6* 9.1* 9.4*   HEMATOCRIT 24.5* 27.2* 28.8* 30.4*   MCV  --   --   --  85.2   MCH, POC  --   --   --  26.3*   MCHC  --   --   --  30.9*   RDW  --   --   --  15   MPV  --   --   --  10.6       No results for input(s): TSH, FREET3 in the last 168 hours.    Invalid input(s): FREET4        Rads:     Radiology Results (24 Hour)     ** No results found for the last 24 hours. **              Assessment and Plan:      Patient Active Problem List    Diagnosis   . Total knee replacement status .  Hx of Chronic infection of prosthetic knee.  S/P right TKA revision reimplantation 07/01/14 -continue plan per Ortho/ID, has been changed to PO abx by ID  Deep vein thrombosis prophylaxis as per Ortho    . Low back pain   . Chronic obstructive pulmonary disease- no complaints of shortness of breath, currently stable    . Hypertension -blood pressure stable.  Continue medications.     Marland Kitchen Hx Non-healing surgical wound- right knee, poor wound healing-  On juven, Vit C and ensure.   . Acute Blood Loss Anemia, s/p surgery- continue iron and vitamin C , currently asymptomatic   ESBL, MRSA hx -on isolation   Possible d/c today -per ortho once abx recs final (length of Augmentin course per ID)    Signed by: Sheryle Hail, NP        Addendum:  Spoke with Dr. Rebecca Eaton. Antibiotic changed to Amoxicillin (indefinite course planned). Pt will f/u with ID (Dr. Dan Humphreys). F/u CBC will be done in 1 week and sent to her PCP. D/w CM.

## 2014-07-08 ENCOUNTER — Inpatient Hospital Stay: Payer: No Typology Code available for payment source | Admitting: Internal Medicine

## 2014-07-08 ENCOUNTER — Inpatient Hospital Stay
Admission: EM | Admit: 2014-07-08 | Discharge: 2014-07-13 | DRG: 357 | Disposition: A | Discharge status: Home Health | Payer: No Typology Code available for payment source | Attending: Internal Medicine | Admitting: Internal Medicine

## 2014-07-08 DIAGNOSIS — M1990 Unspecified osteoarthritis, unspecified site: Secondary | ICD-10-CM | POA: Diagnosis present

## 2014-07-08 DIAGNOSIS — K259 Gastric ulcer, unspecified as acute or chronic, without hemorrhage or perforation: Secondary | ICD-10-CM | POA: Diagnosis present

## 2014-07-08 DIAGNOSIS — Z7982 Long term (current) use of aspirin: Secondary | ICD-10-CM

## 2014-07-08 DIAGNOSIS — Z96659 Presence of unspecified artificial knee joint: Secondary | ICD-10-CM | POA: Diagnosis present

## 2014-07-08 DIAGNOSIS — K2971 Gastritis, unspecified, with bleeding: Principal | ICD-10-CM | POA: Diagnosis present

## 2014-07-08 DIAGNOSIS — Z22322 Carrier or suspected carrier of Methicillin resistant Staphylococcus aureus: Secondary | ICD-10-CM

## 2014-07-08 DIAGNOSIS — J449 Chronic obstructive pulmonary disease, unspecified: Secondary | ICD-10-CM | POA: Diagnosis present

## 2014-07-08 DIAGNOSIS — Z23 Encounter for immunization: Secondary | ICD-10-CM

## 2014-07-08 DIAGNOSIS — E876 Hypokalemia: Secondary | ICD-10-CM | POA: Diagnosis not present

## 2014-07-08 DIAGNOSIS — K922 Gastrointestinal hemorrhage, unspecified: Secondary | ICD-10-CM

## 2014-07-08 DIAGNOSIS — E871 Hypo-osmolality and hyponatremia: Secondary | ICD-10-CM | POA: Diagnosis not present

## 2014-07-08 DIAGNOSIS — D638 Anemia in other chronic diseases classified elsewhere: Secondary | ICD-10-CM | POA: Diagnosis present

## 2014-07-08 DIAGNOSIS — Z87891 Personal history of nicotine dependence: Secondary | ICD-10-CM

## 2014-07-08 DIAGNOSIS — Y848 Other medical procedures as the cause of abnormal reaction of the patient, or of later complication, without mention of misadventure at the time of the procedure: Secondary | ICD-10-CM | POA: Diagnosis present

## 2014-07-08 DIAGNOSIS — D62 Acute posthemorrhagic anemia: Secondary | ICD-10-CM | POA: Diagnosis present

## 2014-07-08 DIAGNOSIS — I129 Hypertensive chronic kidney disease with stage 1 through stage 4 chronic kidney disease, or unspecified chronic kidney disease: Secondary | ICD-10-CM | POA: Diagnosis present

## 2014-07-08 DIAGNOSIS — M545 Low back pain: Secondary | ICD-10-CM | POA: Diagnosis present

## 2014-07-08 DIAGNOSIS — T8131XA Disruption of external operation (surgical) wound, not elsewhere classified, initial encounter: Secondary | ICD-10-CM | POA: Diagnosis present

## 2014-07-08 DIAGNOSIS — N189 Chronic kidney disease, unspecified: Secondary | ICD-10-CM | POA: Diagnosis present

## 2014-07-08 LAB — CBC AND DIFFERENTIAL
Basophils Absolute Automated: 0.02 10*3/uL (ref 0.00–0.20)
Basophils Absolute Automated: 0.01 10*3/uL (ref 0.00–0.20)
Basophils Automated: 0 %
Basophils Automated: 0 %
Eosinophils Absolute Automated: 0.23 10*3/uL (ref 0.00–0.70)
Eosinophils Absolute Automated: 0.19 10*3/uL (ref 0.00–0.70)
Eosinophils Automated: 2 %
Eosinophils Automated: 2 %
Hematocrit: 26.5 % — ABNORMAL LOW (ref 37.0–47.0)
Hematocrit: 25 % — ABNORMAL LOW (ref 37.0–47.0)
Hgb: 8.5 g/dL — ABNORMAL LOW (ref 12.0–16.0)
Hgb: 8 g/dL — ABNORMAL LOW (ref 12.0–16.0)
Immature Granulocytes Absolute: 0.13 10*3/uL — ABNORMAL HIGH
Immature Granulocytes Absolute: 0.1 10*3/uL — ABNORMAL HIGH
Immature Granulocytes: 1 %
Immature Granulocytes: 1 %
Lymphocytes Absolute Automated: 1.71 10*3/uL (ref 0.50–4.40)
Lymphocytes Absolute Automated: 1.84 10*3/uL (ref 0.50–4.40)
Lymphocytes Automated: 16 %
Lymphocytes Automated: 17 %
MCH: 26.2 pg — ABNORMAL LOW (ref 28.0–32.0)
MCH: 26.1 pg — ABNORMAL LOW (ref 28.0–32.0)
MCHC: 32.1 g/dL (ref 32.0–36.0)
MCHC: 32 g/dL (ref 32.0–36.0)
MCV: 81.8 fL (ref 80.0–100.0)
MCV: 81.7 fL (ref 80.0–100.0)
MPV: 10.7 fL (ref 9.4–12.3)
MPV: 10.7 fL (ref 9.4–12.3)
Monocytes Absolute Automated: 1.42 10*3/uL — ABNORMAL HIGH (ref 0.00–1.20)
Monocytes Absolute Automated: 1.19 10*3/uL (ref 0.00–1.20)
Monocytes: 14 %
Monocytes: 11 %
Neutrophils Absolute: 6.99 10*3/uL (ref 1.80–8.10)
Neutrophils Absolute: 7.33 10*3/uL (ref 1.80–8.10)
Neutrophils: 67 %
Neutrophils: 69 %
Nucleated RBC: 0 /100 WBC (ref 0–1)
Nucleated RBC: 0 /100 WBC (ref 0–1)
Platelets: 221 10*3/uL (ref 140–400)
Platelets: 208 10*3/uL (ref 140–400)
RBC: 3.24 10*6/uL — ABNORMAL LOW (ref 4.20–5.40)
RBC: 3.06 10*6/uL — ABNORMAL LOW (ref 4.20–5.40)
RDW: 15 % (ref 12–15)
RDW: 15 % (ref 12–15)
WBC: 10.37 10*3/uL (ref 3.50–10.80)
WBC: 10.56 10*3/uL (ref 3.50–10.80)

## 2014-07-08 LAB — URINALYSIS, REFLEX TO MICROSCOPIC EXAM IF INDICATED
Bilirubin, UA: NEGATIVE
Glucose, UA: NEGATIVE
Ketones UA: 5 — AB
Nitrite, UA: NEGATIVE
Protein, UR: NEGATIVE
Specific Gravity UA: 1.011 (ref 1.001–1.035)
Urine pH: 7 (ref 5.0–8.0)
Urobilinogen, UA: NEGATIVE mg/dL

## 2014-07-08 LAB — COMPREHENSIVE METABOLIC PANEL
ALT: 11 U/L (ref 0–55)
AST (SGOT): 16 U/L (ref 5–34)
Albumin/Globulin Ratio: 0.7 — ABNORMAL LOW (ref 0.9–2.2)
Albumin: 2.8 g/dL — ABNORMAL LOW (ref 3.5–5.0)
Alkaline Phosphatase: 77 U/L (ref 37–106)
BUN: 9 mg/dL (ref 7–19)
Bilirubin, Total: 0.9 mg/dL (ref 0.2–1.2)
CO2: 24 mEq/L (ref 22–29)
Calcium: 8.8 mg/dL (ref 7.9–10.2)
Chloride: 107 mEq/L (ref 100–111)
Creatinine: 0.7 mg/dL (ref 0.6–1.0)
Globulin: 3.8 g/dL — ABNORMAL HIGH (ref 2.0–3.6)
Glucose: 93 mg/dL (ref 70–100)
Potassium: 3.8 mEq/L (ref 3.5–5.1)
Protein, Total: 6.6 g/dL (ref 6.0–8.3)
Sodium: 143 mEq/L (ref 136–145)

## 2014-07-08 LAB — TYPE AND SCREEN
AB Screen Gel: NEGATIVE
ABO Rh: B POS

## 2014-07-08 LAB — GFR: EGFR: >60

## 2014-07-08 MED ORDER — BISOPROLOL FUMARATE 5 MG PO TABS
5.0000 mg | ORAL_TABLET | Freq: Every day | ORAL | Status: DC
Start: 2014-07-08 — End: 2014-07-13
  Administered 2014-07-08 – 2014-07-13 (×6): 5 mg via ORAL
  Filled 2014-07-08 (×6): qty 1

## 2014-07-08 MED ORDER — GABAPENTIN 300 MG PO CAPS
300.0000 mg | ORAL_CAPSULE | Freq: Every day | ORAL | Status: DC
Start: 2014-07-08 — End: 2014-07-13
  Administered 2014-07-08 – 2014-07-13 (×6): 300 mg via ORAL
  Filled 2014-07-08 (×6): qty 1

## 2014-07-08 MED ORDER — HYDROCODONE-ACETAMINOPHEN 5-325 MG PO TABS
1.0000 | ORAL_TABLET | Freq: Four times a day (QID) | ORAL | Status: DC | PRN
Start: 2014-07-08 — End: 2014-07-13
  Administered 2014-07-08 – 2014-07-13 (×7): 1 via ORAL
  Filled 2014-07-08 (×9): qty 1

## 2014-07-08 MED ORDER — AMOXICILLIN 250 MG PO CAPS
875.0000 mg | ORAL_CAPSULE | Freq: Two times a day (BID) | ORAL | Status: DC
Start: 2014-07-08 — End: 2014-07-08

## 2014-07-08 MED ORDER — ONDANSETRON HCL 8 MG PO TABS
4.0000 mg | ORAL_TABLET | Freq: Four times a day (QID) | ORAL | Status: DC | PRN
Start: 2014-07-08 — End: 2014-07-13

## 2014-07-08 MED ORDER — SODIUM CHLORIDE 0.9 % IV MBP
4.5000 g | Freq: Three times a day (TID) | INTRAVENOUS | Status: DC
Start: 2014-07-08 — End: 2014-07-13
  Administered 2014-07-08 – 2014-07-13 (×14): 4.5 g via INTRAVENOUS
  Filled 2014-07-08 (×19): qty 20

## 2014-07-08 MED ORDER — SODIUM CHLORIDE 0.9 % IV SOLN
100.0000 mL/h | INTRAVENOUS | Status: DC
Start: 2014-07-08 — End: 2014-07-08
  Administered 2014-07-08: 100 mL/h via INTRAVENOUS

## 2014-07-08 MED ORDER — SODIUM CHLORIDE 0.45 % IV SOLN
INTRAVENOUS | Status: DC
Start: 2014-07-08 — End: 2014-07-13
  Administered 2014-07-08 – 2014-07-12 (×3): 75 mL/h via INTRAVENOUS

## 2014-07-08 MED ORDER — CLONIDINE HCL 0.1 MG PO TABS
0.1000 mg | ORAL_TABLET | Freq: Every day | ORAL | Status: DC
Start: 2014-07-08 — End: 2014-07-13
  Administered 2014-07-08 – 2014-07-13 (×7): 0.1 mg via ORAL
  Filled 2014-07-08 (×6): qty 1

## 2014-07-08 MED ORDER — AMLODIPINE BESYLATE 5 MG PO TABS
5.0000 mg | ORAL_TABLET | Freq: Every day | ORAL | Status: DC
Start: 2014-07-08 — End: 2014-07-10
  Administered 2014-07-08 – 2014-07-09 (×2): 5 mg via ORAL
  Filled 2014-07-08 (×3): qty 1

## 2014-07-08 MED ORDER — TIOTROPIUM BROMIDE MONOHYDRATE 18 MCG IN CAPS
18.0000 ug | ORAL_CAPSULE | Freq: Every morning | RESPIRATORY_TRACT | Status: DC
Start: 2014-07-08 — End: 2014-07-13
  Administered 2014-07-08 – 2014-07-13 (×3): 18 ug via RESPIRATORY_TRACT
  Filled 2014-07-08: qty 5

## 2014-07-08 MED ORDER — TRAMADOL HCL 50 MG PO TABS
50.0000 mg | ORAL_TABLET | Freq: Four times a day (QID) | ORAL | Status: DC | PRN
Start: 2014-07-08 — End: 2014-07-13
  Administered 2014-07-10 – 2014-07-13 (×7): 50 mg via ORAL
  Filled 2014-07-08 (×7): qty 1

## 2014-07-08 MED ORDER — PANTOPRAZOLE SODIUM 40 MG IV SOLR
40.0000 mg | Freq: Once | INTRAVENOUS | Status: AC
Start: 2014-07-08 — End: 2014-07-08
  Administered 2014-07-08: 40 mg via INTRAVENOUS
  Filled 2014-07-08: qty 40

## 2014-07-08 MED ORDER — SODIUM CHLORIDE 0.9 % IV SOLN
8.0000 mg/h | INTRAVENOUS | Status: AC
Start: 2014-07-08 — End: 2014-07-09
  Administered 2014-07-08 (×3): 8 mg/h via INTRAVENOUS
  Filled 2014-07-08 (×3): qty 80

## 2014-07-08 MED ORDER — NALOXONE HCL 0.4 MG/ML IJ SOLN
0.2000 mg | INTRAMUSCULAR | Status: DC | PRN
Start: 2014-07-08 — End: 2014-07-13

## 2014-07-08 MED ORDER — AMOXICILLIN 250 MG/5 ML ORAL SYRINGE
875.0000 mg | Freq: Two times a day (BID) | ORAL | Status: DC
Start: 2014-07-08 — End: 2014-07-08
  Administered 2014-07-08: 875 mg via ORAL
  Filled 2014-07-08 (×2): qty 20

## 2014-07-08 NOTE — ED Notes (Signed)
Pt transported to the unit without tele monitor per MD

## 2014-07-08 NOTE — Progress Notes (Addendum)
PROGRESS NOTE    Date Time: 07/08/2014 12:38 PM  Patient Name: [name], [name]      Assessment:   - s/p R TKA reimplantation on 07/01/14, discharged 07/06/14 on po Augment 875mg  BID and Aspirin 325mg  po daily x4 weeks.   -admitted to medicine for r/o GI bleed v gastritis w/ dark stools secondary to Fe tablets    Plan:   Continue PT - 50% WB RLE in HKB locked in extension    Continue anticoagulation - foot pumps ordered, medicine holding ASA pending GI workup, restart ASA as soon as possible    GI workup: appreciate medical management, occult blood stool sample pending    High risk surgical wound: changed dressing today, placed steri strips on one small spot at incision with bloody drainage,   Nutrition support: please order nutrition support (boost, juven, MVI, vit C) when regular diet ordered    D/c planning - home when medically clear, home health nursing, continue po Augmentin per ID, f/u ortho 2weeks post op, f/u ID 3-4 weeks post op    Subjective:   Pain controlled, tolerating diet, denies nausea/vomiting/diarrhea    Medications:     Current Facility-Administered Medications   Medication Dose Route Frequency   . amLODIPine  5 mg Oral Daily   . amoxicillin  875 mg Oral BID   . bisoprolol  5 mg Oral Daily   . cloNIDine  0.1 mg Oral Daily   . gabapentin  300 mg Oral Daily   . tiotropium  18 mcg Inhalation QAM       Physical Exam:     Filed Vitals:    07/08/14 1000   BP:    Pulse: 88   Temp:    Resp: 15   SpO2: 95%       Intake and Output Summary (Last 24 hours) at Date Time  No intake or output data in the 24 hours ending 07/08/14 1238    General appearance - no acute distress  Musculoskeletal -  Distal aspect of incision is intact without dehiscence or breakdown, one small spot between sutures has some bloody non-purulent drainage, distal abd pad has bloody drainage, no erythema or fluctuance  Fires quad/TA/EHL/GSC  SILT SP/DP/TN  WWP distally  Calves soft, nontender bilateral    Labs:     Results     Procedure  Component Value Units Date/Time    MRSA culture [[id number]] Collected:  07/08/14 0516    Specimen Information:  Body Fluid / Nares and Throat Updated:  07/08/14 1050    CBC and differential [[id number]]  (Abnormal) Collected:  07/08/14 0949    Specimen Information:  Blood / Blood Updated:  07/08/14 1019     WBC 10.56 x10 3/uL      RBC 3.06 (L) x10 6/uL      Hgb 8.0 (L) g/dL      Hematocrit 56.2 (L) %      MCV 81.7 fL      MCH 26.1 (L) pg      MCHC 32.0 g/dL      RDW 15 %      Platelets 208 x10 3/uL      MPV 10.7 fL      Neutrophils 69 %      Lymphocytes Automated 17 %      Monocytes 11 %      Eosinophils Automated 2 %      Basophils Automated 0 %      Immature Granulocyte 1 %  Nucleated RBC 0 /100 WBC      Neutrophils Absolute 7.33 x10 3/uL      Abs Lymph Automated 1.84 x10 3/uL      Abs Mono Automated 1.19 x10 3/uL      Abs Eos Automated 0.19 x10 3/uL      Absolute Baso Automated 0.01 x10 3/uL      Absolute Immature Granulocyte 0.10 (H) x10 3/uL     Type and Screen [[id number]] Collected:  07/08/14 0232    Specimen Information:  Blood Updated:  07/08/14 0330     ABO Rh B POS      AB Screen Gel NEG     Comprehensive Metabolic Panel (CMP) [[id number]]  (Abnormal) Collected:  07/08/14 0232    Specimen Information:  Blood Updated:  07/08/14 0257     Glucose 93 mg/dL      BUN 9 mg/dL      Creatinine 0.7 mg/dL      Sodium 782 mEq/L      Potassium 3.8 mEq/L      Chloride 107 mEq/L      CO2 24 mEq/L      CALCIUM 8.8 mg/dL      Protein, Total 6.6 g/dL      Albumin 2.8 (L) g/dL      AST (SGOT) 16 U/L      ALT 11 U/L      Alkaline Phosphatase 77 U/L      Bilirubin, Total 0.9 mg/dL      Globulin 3.8 (H) g/dL      Albumin/Globulin Ratio 0.7 (L)     GFR [[id number]] Collected:  07/08/14 0232     EGFR >60.0 Updated:  07/08/14 0257    UA, Reflex to Microscopic [[id number]]  (Abnormal) Collected:  07/08/14 0232    Specimen Information:  Urine Updated:  07/08/14 0248     Urine Type Clean Catch      Color, UA Yellow      Clarity, UA  Sl Cloudy (A)      Specific Gravity UA 1.011      Urine pH 7.0      Leukocyte Esterase, UA Large (A)      Nitrite, UA Negative      Protein, UR Negative      Glucose, UA Negative      Ketones UA 5 (A)      Urobilinogen, UA Negative mg/dL      Bilirubin, UA Negative      Blood, UA Small (A)      RBC, UA 6 - 10 (A) /hpf      WBC, UA 11 - 25 (A) /hpf      Squamous Epithelial Cells, Urine 0 - 5 /hpf     CBC and differential [[id number]]  (Abnormal) Collected:  07/08/14 0232    Specimen Information:  Blood / Blood Updated:  07/08/14 0240     WBC 10.37 x10 3/uL      RBC 3.24 (L) x10 6/uL      Hgb 8.5 (L) g/dL      Hematocrit 01.0 (L) %      MCV 81.8 fL      MCH 26.2 (L) pg      MCHC 32.1 g/dL      RDW 15 %      Platelets 221 x10 3/uL      MPV 10.7 fL      Neutrophils 67 %      Lymphocytes Automated 16 %  Monocytes 14 %      Eosinophils Automated 2 %      Basophils Automated 0 %      Immature Granulocyte 1 %      Nucleated RBC 0 /100 WBC      Neutrophils Absolute 6.99 x10 3/uL      Abs Lymph Automated 1.71 x10 3/uL      Abs Mono Automated 1.42 (H) x10 3/uL      Abs Eos Automated 0.23 x10 3/uL      Absolute Baso Automated 0.02 x10 3/uL      Absolute Immature Granulocyte 0.13 (H) x10 3/uL           Physical Therapy:   Pain Score: 4-moderate pain (07/08/14 0800)              Rads:   Radiological Procedure reviewed.    Signed by: Collene Schlichter III

## 2014-07-08 NOTE — ED Notes (Signed)
Microwave dinner given to pt and daughter

## 2014-07-08 NOTE — Plan of Care (Signed)
Problem: Safety  Goal: Patient will be free from injury during hospitalization  Outcome: Progressing    Problem: Pain  Goal: Patient's pain/discomfort is manageable  Outcome: Progressing  Pt denies any pain at this time    Problem: Psychosocial and Spiritual Needs  Goal: Demonstrates ability to cope with hospitalization/illness  Outcome: Progressing    Problem: Moderate/High Fall Risk Score >/=15  Goal: Patient will remain free of falls  Outcome: Progressing    Problem: Potential for Compromised Skin Integrity  Goal: Skin integrity is maintained or improved  Assess and monitor skin integrity. Identify patients at risk for skin breakdown on admission and per policy. Collaborate with interdisciplinary team and initiate plans and interventions as needed.   Outcome: Progressing  Keep patient skin clean and dry.   Goal: Nutritional status is improving  Monitor and assess patient for malnutrition (ex- brittle hair, bruises, dry skin, pale skin and conjunctiva, muscle wasting, smooth red tongue, and disorientation). Collaborate with interdisciplinary team and initiate plan and interventions as ordered. Monitor patient's weight and dietary intake as ordered or per policy. Utilize nutrition screening tool and intervene per policy. Determine patient's food preferences and provide high-protein, high-caloric foods as appropriate.   Outcome: Progressing  Pt is tolerating clear liquid well.    Problem: Tissue integrity  Goal: Damaged tissue is healing and protected  Outcome: Progressing  Goal: Nutritional Status Improving  Outcome: Progressing    Problem: Urinary Incontinence  Goal: Perineal skin integrity is maintained or improved  Assess genitourinary system, perineal skin, labs (urinalysis), and history of incontinence to include past management, aggravating, and alleviating factors. Collaborate with interdisciplinary team and initiate plans and interventions as needed.   Outcome: Progressing    Problem: GI Bleed  Goal: Fluid  and electrolyte balance are achieved/maintained  Outcome: Progressing  Pt is receiving iv fluid 0.45 ns @ 75 ml/hr  Goal: Elimination patterns are normal or improving  Outcome: Progressing  Goal: Coordination of nutrition care  Outcome: Progressing  Goal: Mobility/Activity is maintained at optimal level for patient  Outcome: Progressing  Goal: Patient will have regular and routine bowel evacuation  Outcome: Progressing  Pt has large Bowel movement this am  Goal: No Bleeding  Outcome: Progressing  No active bleeding noted.

## 2014-07-08 NOTE — Progress Notes (Signed)
Case Management Initial Discharge Planning Assessment     Psychosocial/Demographic Information   Name of interviewee/s:  Ms Presas/daughter    Orientation and decision making abilities of patient (ie a&ox3 able to make decisions, demented patient, patient on vent, etc)    alert/awake / oriented    Does the patient have an Advance Directive? Location? (home/on chart, if home-advised to bring in copy?) <no information>   ]   Healthcare Decision Maker (HDM) (if other than the patient) Include relationship and contact information.      Any additional emergency contacts? Extended Emergency Contact Information  Primary Emergency Contact: Flowers,Debra  Address: [address]           [place] [address] [place]  Home Phone: [phone]  Mobile Phone: [phone]  Relation: Daughter  Secondary Emergency Contact: Delon [place]  Mobile Phone: [phone]  Relation: Son   Pt lives with:  Living Arrangements: Family members]   Type of residence where patient lives:    ]private home    ]   Prior level of functioning (ambulation & ADL's)   ]50% WB status : knee brace locked    Support system-list  (i.e.church, friends, extended family, friends?)  family   Do you want to designate an individual who will care for or assist you upon discharge?    If yes: Please list the name, relationship, phone number, and address of the designated individual. Name:  Relationship:  Phone Number:  Address:       Correct Insurance listed on face sheet - verified with the patient/HDM  Yes      Discharge Planning Services in Place  Name of Primary Care Physician verified in patient banner (update in patient banner if not listed) Janalyn Rouse, MD  [phone]   What DME does the patient currently own? (rolling walker, hospital bed, home O2, BiPAP/CPAP, bedside commode, cane, hoyer lift)  ]walker, bsc, cane   ]   ]   Are PT/OT services indicated? If so, has it been ordered?   TBD   Has the  patient been to an Acute Rehab or SNF in the past?  If so, where?    N   Does the patient currently have home health or hospice/palliative services in place?  If so, list agency name.    Y: [facility]    Does the patient already have community dialysis set up?  If so, where?  N      Readmission Assessment  What is the current LACE score?  7   Is this patient an inpatient to inpatient 30 day readmission?  Yes   Previous admission discharge diagnosis  right knee revision/debridement    Was patient readmitted from a facility?     no   Patient active with Home Health?  Yes : [facility]      Patient active with Home Hospice?    N   Contributing factors to readmission (i.e., no follow up appt on previous d/c, unable to get meds, no insurance, no social support, etc.)    Did patient/family understand what medication was for and how to administer, symptoms to indicate worsening condition, activity and diet restrictions at time of previous d/c?  on Fe ,, abd pain,black stools       no issues identified       Anticipated Discharge Plan  Anticipated Disposition: Option A  home with [facility]    Anticipated Disposition: Option B  Who will transport the patient upon discharge?  TBD   If applicable, were SNF or Hospice choices provided?  N   Palliative Care Consult needed? (if yes, contact attending MD)  N      Medicare/Medicare HMO Patients Only  If this patient is inpatient, was an initial IMM signed within 24 hours of admission? (Look in media tab, documents table or shadow chart)  Y     Is this patient identified as a Medicare focused diagnosis or readmission? Yes/No N       EPIC Documentation  CM Assessment complete button selected in CM Navigator? Yes/No Y   Readmissions flowsheet completed in CM Navigator if patient is readmission? Yes/No

## 2014-07-08 NOTE — ED Provider Notes (Addendum)
Physician/Midlevel provider first contact with patient: 07/08/14 0222         History     Chief Complaint   Patient presents with   . GI Bleeding     HPI Comments: Transfer for melanotic stools today from [place]. NO chest pain - some epigastrium aching pain no dizziness no shortness of breath, no gever.    Patient is a 78 y.o. female presenting with general illness.   Illness  Location:  Transfer from [facility] for GI bleed black stool, no pain, except epigastrium aching all day, no syncope, no shortness of breath, no fever,  Severity:  Moderate  Onset quality:  Sudden  Duration:  14 hours  Timing:  Sporadic  Chronicity:  New  Associated symptoms: abdominal pain    Associated symptoms: no chest pain, no congestion, no cough, no diarrhea, no fatigue, no fever, no headaches, no loss of consciousness, no nausea, no rash, no rhinorrhea, no shortness of breath and no sore throat             Past Medical History   Diagnosis Date   . Hypertension    . Pneumonia    . Chronic obstructive pulmonary disease    . Headache(784.0)      not since surgery in 1957   . Arthritis    . Difficulty in walking(719.7)    . Low back pain    . Urinary tract infection    . Depression        Past Surgical History   Procedure Laterality Date   . Cervical spine surgery     . Lumbar spine surgery  posterior w/ hardware   . Tonsillectomy     . Brain surgery  R side/ 1957 Optic nerve pressure   . Eye surgery       cataracts   . Colonoscopy     . Tubal ligation  1961   . Arthroplasty, knee, total  08/25/2013     Procedure: ARTHROPLASTY, KNEE, TOTAL;  Surgeon: Lane Hacker, MD;  Location: [facility];  Service: Orthopedics;  Laterality: Right;   . Arthroplasty, knee, revision total  09/05/2013     Procedure: ARTHROPLASTY, KNEE, REVISION TOTAL;  Surgeon: Lane Hacker, MD;  Location: [facility];  Service: Orthopedics;  Laterality: Right;  RT KNEE WASHOUT & POLY EXCHANGE **POA/CH** (STRYKER)    . Arthroplasty, knee, resection   09/15/2013     Procedure: ARTHROPLASTY, KNEE, RESECTION;  Surgeon: Lane Hacker, MD;  Location: [facility];  Service: Orthopedics;  Laterality: Right;  AND PLACEMENT OF ANTIBIOTIC SPACER  Placement of wound vac   . Arthroplasty, knee, revision total  09/22/2013     Procedure: ARTHROPLASTY, KNEE, REVISION TOTAL;  Surgeon: Lane Hacker, MD;  Location: [facility];  Service: Orthopedics;  Laterality: Right;   . Debridement, muscle flap closure  09/22/2013     Procedure: DEBRIDEMENT, MUSCLE FLAP CLOSURE;  Surgeon: Ernie Avena, MD;  Location: [facility];  Service: Plastics;  Laterality: Right;  DEBRIDEMENT & MUSCLE FLAP KNEE RT   . Graft, skin  split thickness, (medical)  09/22/2013     Procedure: GRAFT, SKIN  SPLIT THICKNESS, (MEDICAL);  Surgeon: Ernie Avena, MD;  Location: [facility];  Service: Plastics;  Laterality: N/A;   . Echocardiogram, transesophageal N/A 03/26/2014     Procedure: ECHOCARDIOGRAM, TRANSESOPHAGEAL;  Surgeon: Marni Griffon, MD;  Location: [facility];  Service: Cardiology;  Laterality:  N/A;   . Arthroplasty, knee, revision total Right 07/01/2014     Procedure: ARTHROPLASTY, KNEE, REVISION TOTAL;  Surgeon: Lane Hacker, MD;  Location: [facility];  Service: Orthopedics;  Laterality: Right;       History reviewed. No pertinent family history.    Social  History   Substance Use Topics   . Smoking status: Former Smoker -- 1.00 packs/day for 45 years     Quit date: 08/20/1998   . Smokeless tobacco: Never Used   . Alcohol Use: Yes      Comment: occassionally       .     No Known Allergies    Current/Home Medications    AMLODIPINE (NORVASC) 5 MG TABLET    Take 5 mg by mouth daily.    AMOXICILLIN (AMOXIL) 875 MG TABLET    Take 1 tablet (875 mg total) by mouth 2 (two) times daily.    ASPIRIN EC 325 MG EC TABLET    Take 1 tablet (325 mg total) by mouth daily.    CELECOXIB (CELEBREX) 200 MG CAPSULE    Take 1 capsule (200 mg total) by mouth daily.    CLONIDINE  (CATAPRES) 0.1 MG TABLET    Take 1 tablet (0.1 mg total) by mouth daily.    DOCUSATE SODIUM (COLACE) 100 MG CAPSULE    Take 1 capsule (100 mg total) by mouth 2 (two) times daily.    GABAPENTIN (NEURONTIN) 300 MG CAPSULE    Take 1 capsule (300 mg total) by mouth every 12 (twelve) hours.    HYDROCHLOROTHIAZIDE (HYDRODIURIL) 25 MG TABLET    Take 25 mg by mouth daily.    HYDROCODONE-ACETAMINOPHEN (NORCO) 5-325 MG PER TABLET    Take 1-2 tablets by mouth every 4 (four) hours as needed for Pain (max 4000mg  tylenol/acetaminophen daily).    IRON-VITAMIN C (VITRON-C) 65-125 MG TAB    Take 1 tablet by mouth daily.    LORATADINE PO    Take 10 mg by mouth as needed.    MULTIVITAMIN (MULTIVITAMIN) TAB    Take 1 tablet by mouth daily.    NEBIVOLOL (BYSTOLIC) 5 MG TABLET    Take 5 mg by mouth daily.    ONDANSETRON (ZOFRAN) 4 MG TABLET    Take 1 tablet (4 mg total) by mouth every 6 (six) hours as needed for Nausea.    POTASSIUM CHLORIDE (K-DUR,KLOR-CON) 20 MEQ TABLET    Take 1 tablet (20 mEq total) by mouth 2 (two) times daily.    TIOTROPIUM (SPIRIVA) 18 MCG INHALATION CAPSULE    Place 18 mcg into inhaler and inhale daily.    TRAMADOL (ULTRAM) 50 MG TABLET    Take 1 tablet (50 mg total) by mouth every 6 (six) hours as needed for Pain.        Review of Systems   Constitutional: Negative for fever and fatigue.   HENT: Negative for congestion, rhinorrhea and sore throat.    Respiratory: Negative for cough and shortness of breath.    Cardiovascular: Negative for chest pain.   Gastrointestinal: Positive for abdominal pain. Negative for nausea, diarrhea and blood in stool.   Genitourinary: Negative for dysuria, hematuria and difficulty urinating.   Skin: Negative for rash.   Neurological: Negative for loss of consciousness and headaches.   All other systems reviewed and are negative.      Physical Exam    BP: 165/76 mmHg, Heart Rate: 82, Temp: 98.3 F (36.8 C), Resp Rate: 18, SpO2: 97 %,  Weight: 102.558 kg    Physical Exam    Constitutional: She is oriented to person, place, and time. She appears well-developed and well-nourished. No distress.   HENT:   Head: Normocephalic and atraumatic.   Right Ear: External ear normal.   Left Ear: External ear normal.   Nose: Nose normal.   Mouth/Throat: Oropharynx is clear and moist.   Eyes: Conjunctivae and EOM are normal. Pupils are equal, round, and reactive to light.   Neck: Normal range of motion. Neck supple.   Cardiovascular: Normal rate, regular rhythm, normal heart sounds and intact distal pulses.  Exam reveals no gallop and no friction rub.    No murmur heard.  Pulmonary/Chest: Effort normal and breath sounds normal. No respiratory distress. She has no wheezes. She has no rales. She exhibits no tenderness.   Abdominal: Soft. Bowel sounds are normal. She exhibits no distension. There is no tenderness. There is no rebound and no guarding.   Musculoskeletal: Normal range of motion.   Neurological: She is alert and oriented to person, place, and time. She has normal reflexes. No cranial nerve deficit.   Skin: Skin is warm and dry. She is not diaphoretic.   Psychiatric: She has a normal mood and affect.   Nursing note and vitals reviewed.      MDM and ED Course     ED Medication Orders     None              MDM  Number of Diagnoses or Management Options  Acute GI bleeding:      Amount and/or Complexity of Data Reviewed  Clinical lab tests: ordered and reviewed  Tests in the radiology section of CPT: ordered and reviewed    Risk of Complications, Morbidity, and/or Mortality  Presenting problems: moderate  Diagnostic procedures: moderate  Management options: moderate    Patient Progress  Patient progress: stable         Procedures    Clinical Impression & Disposition     Clinical Impression  Final diagnoses:   Acute GI bleeding        ED Disposition     Admit Admitting Physician: Rondel Baton [[id number]]  Diagnosis: GI bleed [604540]  Estimated Length of Stay: > or = to 2 midnights  Tentative  Discharge Plan?: Home or Self Care [1]  Patient Class: Inpatient [101]  I certify that inpatient services are medically necessary for this patient. Please see H&P and MD progress notes for additional information about the patient's course of treatment. For Medicare patients, services provided in accordance with 412.3 and expected LOS to be greater than 2 midnights for Medicare patients.: Yes             New Prescriptions    No medications on file               EKG Interpretation:    Rhythm:  Normal Sinus  Ectopy:  None  Rate:  Normal  Conduction:  No blocks  ST Segments:  No acute ST segment changes  T Waves:  No acute T Wave changes  Axis:  Normal  Other findings:    Q Waves:  None seen  Pacing:  Not applicable  Clinical Impression:  Normal EKG      Joseph Berkshire, MD  07/08/14 0311    Labs Reviewed   COMPREHENSIVE METABOLIC PANEL - Abnormal; Notable for the following:     Albumin 2.8 (*)     Globulin 3.8 (*)  Albumin/Globulin Ratio 0.7 (*)     All other components within normal limits   CBC AND DIFFERENTIAL - Abnormal; Notable for the following:     RBC 3.24 (*)     Hgb 8.5 (*)     Hematocrit 26.5 (*)     MCH 26.2 (*)     Abs Mono Automated 1.42 (*)     Absolute Immature Granulocyte 0.13 (*)     All other components within normal limits   URINALYSIS, REFLEX TO MICROSCOPIC EXAM IF INDICATED - Abnormal; Notable for the following:     Clarity, UA Sl Cloudy (*)     Leukocyte Esterase, UA Large (*)     Ketones UA 5 (*)     Blood, UA Small (*)     RBC, UA 6 - 10 (*)     WBC, UA 11 - 25 (*)     All other components within normal limits   GFR   TYPE AND SCREEN     cxr AND    ADMit -    Joseph Berkshire, MD  07/08/14 [phone]

## 2014-07-08 NOTE — Consults (Signed)
Spoke with Dr Ahmed Prima,  orthopaedics are following this patient, Wound care consult cancelled.     Loyal Jacobson RN [facility] ext [phone]

## 2014-07-08 NOTE — Plan of Care (Signed)
Problem: GI Bleed  Goal: Fluid and electrolyte balance are achieved/maintained  Outcome: Progressing  Patient is receiving continuous IV fluids.  NPO at this time  Patient is also on Protonix iv infusion      Goal: No Bleeding  Outcome: Progressing  No bloody stools noted for this shift.  Patient denies nausea and vomiting.

## 2014-07-08 NOTE — Plan of Care (Signed)
Problem: GI Bleed  Goal: Fluid and electrolyte balance are achieved/maintained  Outcome: Progressing  Patient is alert and oriented, denies chest pain/ shortness of breath.  No bloody stools noted at this time, orders for occult noted.  Continues of IV fluids and Protonix infusion. Voiding quantity sufficient urine.  Ongoing nursing support provided.

## 2014-07-08 NOTE — Progress Notes (Signed)
Patient transferred from the ED with a diagnosis of GI Bleed. Patient was recently discharged from [facility] S/p Right knee revision. Right Knee Immobilizer on, patient complained of sternal pain to the chest area and discomfort to the right knee. Denies nausea and vomiting, no bloody stools noted. Patient oriented to call light and within reach, daughter at bedside, will initiate plan of care.

## 2014-07-08 NOTE — ED Notes (Signed)
Pt arrived from [facility] by PTS, c/o of GI bleed.

## 2014-07-08 NOTE — Consults (Signed)
Service Date: 07/08/2014     Patient Type: I     CONSULTING PHYSICIAN: Johnney Killian MD     REFERRING PHYSICIAN: Rondel Baton MD     REASON FOR CONSULTATION:  Dr. Montez Morita requested we evaluate this patient with a right knee wound and  diarrhea in the setting of a right prosthetic joint infection, on  antibiotic therapy.     HISTORY OF PRESENT ILLNESS:  This is a 78 year old female well known to our service who was just  discharged from the hospital after receiving a revision total right knee  arthroplasty on September 23.  The plan at that time was to allow the wound  to heal completely and step down from twice a day amoxicillin 875 mg to  once a day.  Unfortunately, the patient developed some black stools as an  outpatient as well as some epigastric pain with some nausea, vomiting.   Currently, the patient has improved significantly, and a prevailing thought  is this may be just medication related to the iron given to her at  discharge for anemia.  The patient denies any obvious aspiration event.   There has been no cough or shortness of breath.  Her nausea and vomiting  has abated as had any loose stools.  There has been no fevers or chills.   There has been minimal discharge from the incision on her right knee.     CURRENT ANTIBIOTICS:  Amoxicillin 875 twice a day.     ALLERGIES:  No known drug allergies.     PAST MEDICAL HISTORY:  Significant for arthritis, pneumonia, hypertension, COPD, UTIs, depression.     PAST SURGICAL HISTORY:  See previous HPI and consultation, but it is numerous orthopedic and spine  surgeries.     REVIEW OF SYSTEMS:  There is no other obvious pertinent positive finding in review of  psychiatric, neurologic, endocrine, lymphatic, cardiac, pulmonary, GI, GU,  musculoskeletal, or dermatologic systems.     FAMILY HISTORY:  Noncontributory in this case.     SOCIAL HISTORY:  She has about a 50-pack-year smoking history.  There is no abuse of alcohol  or IV drugs noted.     PHYSICAL  EXAMINATION:  VITAL SIGNS:  Shows that she is afebrile and has been, blood pressure  184/74, pulse of 94, and her respirations were 19 and unlabored.  SKIN:  Showed no embolic signs.    HEENT:  The conjunctivae were pale without petechiae.  The oropharynx  showed no lesions.   EXTREMITIES:  The orthopedic exam is noted with wound having no dehiscence  and just a small spot where sutures have some serosanguineous drainage with  no darker discoloration or crepitance noted.  LYMPHATIC:  There is no adenopathy in the right groin or elsewhere.  LUNGS:  Clear.  CARDIAC:  Showed a regular rate and rhythm without a murmur.  ABDOMEN:  Showed no organomegaly, and it was nontender.     LABORATORY DATA:  Shows a white count of 10.6, hemoglobin of 8, and a platelet count of 208.   Her creatinine is 0.7.  Liver enzymes are normal.  Urinalysis has 6 to 10  RBCs, 11 to 25 WBCs and leukocyte esterase, and some cloudy fluid.     IMPRESSION:  A 78 year old female with a history of a right prosthetic joint infection  on prophylactic amoxicillin with a development of a gastritis associated  with melena that may be due to iron alone as opposed to any bleed.  Pyuria  without any symptoms in this patient who is on amoxicillin.     RECOMMENDATION:  We will obtain a urine culture and broaden antibiotic therapy until the  results of the urine culture are finalized.           D:  07/08/2014 14:39 PM by Dr. Raphael Gibney. Rebecca Eaton, MD [id number])  T:  07/08/2014 15:44 PM by Susa Day      (Conf: [id number]) (Doc ID: [id number])

## 2014-07-08 NOTE — H&P (Signed)
ADMISSION HISTORY AND PHYSICAL EXAM    Date Time: 07/08/2014 9:34 AM  Patient Name: [name]  Attending Physician: Rondel Baton, MD    Assessment:   Epigastric Pain with nausea and vomiting, likely from gastritis  Black Stools - suspect this from iron supplement  Stable Chronic Anemia  PJI, S/p TKA revision reimplantation 07/01/14 - on amoxacillin, ID following  Hx Non-healing surgical wound- right knee, poor wound healing  Hypertension  COPD, stable  ESBL, MRSA hx -on isolation     Patient Active Problem List   Diagnosis   . Total knee replacement status   . Low back pain   . Chronic obstructive pulmonary disease   . Hypertension   . Debility following multiple R knee revisions   . Non-healing surgical wound- right knee    . Fever   . Leukocytosis   . Anemia   . Hypokalemia   . Chronic infection of prosthetic knee   . COPD (chronic obstructive pulmonary disease)   . HTN (hypertension)   . Infection   . Osteoarthritis   . GI bleed       Plan:   Admitted to [facility]. Repeat H/H. Check occult blood in stool. Call GI consult if there is evidence of GIB. Cont PPI for gastritis. Zofran for nausea and vomiting. Clear liquid diet for now. Stop aspirin, celebrex, and iron. Resume BP meds, spiriva, and pain meds. Continue amoxacillin. ID notified. SCDs. Wound care consult for chronic wound. Full code.     History of Present Illness:   [name] is a 78 y.o. female recently discharged from [facility] who presents to the hospital with black stools. Patient was placed on Vitron C prior to discharge for anemia. She noted black stools at home. Patient took a picture of it and went to her PCP. She also has been complaining of epigastric pain associated with nausea and vomiting, and is not able to eat much at home. Patient was sent to ED per PCP recommendation. In ED she had unremarkable work up, including EKG without ST changes, and CBC showing chronic anemia, with H/H higher then previous.      Past Medical History:     Past  Medical History   Diagnosis Date   . Hypertension    . Pneumonia    . Chronic obstructive pulmonary disease    . Headache(784.0)      not since surgery in 1957   . Arthritis    . Difficulty in walking(719.7)    . Low back pain    . Urinary tract infection    . Depression        Past Surgical History:     Past Surgical History   Procedure Laterality Date   . Cervical spine surgery     . Lumbar spine surgery  posterior w/ hardware   . Tonsillectomy     . Brain surgery  R side/ 1957 Optic nerve pressure   . Eye surgery       cataracts   . Colonoscopy     . Tubal ligation  1961   . Arthroplasty, knee, total  08/25/2013     Procedure: ARTHROPLASTY, KNEE, TOTAL;  Surgeon: Lane Hacker, MD;  Location: [facility];  Service: Orthopedics;  Laterality: Right;   . Arthroplasty, knee, revision total  09/05/2013     Procedure: ARTHROPLASTY, KNEE, REVISION TOTAL;  Surgeon: Lane Hacker, MD;  Location: [facility];  Service: Orthopedics;  Laterality: Right;  RT KNEE WASHOUT & POLY EXCHANGE **POA/CH** (STRYKER)    . Arthroplasty, knee, resection  09/15/2013     Procedure: ARTHROPLASTY, KNEE, RESECTION;  Surgeon: Lane Hacker, MD;  Location: [facility];  Service: Orthopedics;  Laterality: Right;  AND PLACEMENT OF ANTIBIOTIC SPACER  Placement of wound vac   . Arthroplasty, knee, revision total  09/22/2013     Procedure: ARTHROPLASTY, KNEE, REVISION TOTAL;  Surgeon: Lane Hacker, MD;  Location: [facility];  Service: Orthopedics;  Laterality: Right;   . Debridement, muscle flap closure  09/22/2013     Procedure: DEBRIDEMENT, MUSCLE FLAP CLOSURE;  Surgeon: Ernie Avena, MD;  Location: [facility];  Service: Plastics;  Laterality: Right;  DEBRIDEMENT & MUSCLE FLAP KNEE RT   . Graft, skin  split thickness, (medical)  09/22/2013     Procedure: GRAFT, SKIN  SPLIT THICKNESS, (MEDICAL);  Surgeon: Ernie Avena, MD;  Location: [facility];  Service: Plastics;  Laterality: N/A;   . Echocardiogram,  transesophageal N/A 03/26/2014     Procedure: ECHOCARDIOGRAM, TRANSESOPHAGEAL;  Surgeon: Marni Griffon, MD;  Location: [facility];  Service: Cardiology;  Laterality: N/A;   . Arthroplasty, knee, revision total Right 07/01/2014     Procedure: ARTHROPLASTY, KNEE, REVISION TOTAL;  Surgeon: Lane Hacker, MD;  Location: [facility];  Service: Orthopedics;  Laterality: Right;       Family History:   History reviewed. No pertinent family history.    Social History:     History     Social History   . Marital Status: Widowed     Spouse Name: N/A     Number of Children: N/A   . Years of Education: N/A     Social History Main Topics   . Smoking status: Former Smoker -- 1.00 packs/day for 45 years     Quit date: 08/20/1998   . Smokeless tobacco: Never Used   . Alcohol Use: Yes      Comment: occassionally   . Drug Use: No   . Sexual Activity: Not on file     Other Topics Concern   . Not on file     Social History Narrative       Allergies:   No Known Allergies    Medications:     Prescriptions prior to admission   Medication Sig   . amLODIPine (NORVASC) 5 MG tablet Take 5 mg by mouth daily.   Marland Kitchen amoxicillin (AMOXIL) 875 MG tablet Take 1 tablet (875 mg total) by mouth 2 (two) times daily.   Marland Kitchen aspirin EC 325 MG EC tablet Take 1 tablet (325 mg total) by mouth daily.   . celecoxib (CELEBREX) 200 MG capsule Take 1 capsule (200 mg total) by mouth daily.   . cloNIDine (CATAPRES) 0.1 MG tablet Take 1 tablet (0.1 mg total) by mouth daily.   Marland Kitchen docusate sodium (COLACE) 100 MG capsule Take 1 capsule (100 mg total) by mouth 2 (two) times daily.   Marland Kitchen gabapentin (NEURONTIN) 300 MG capsule Take 1 capsule (300 mg total) by mouth every 12 (twelve) hours. (Patient taking differently: Take 300 mg by mouth daily.   )   . hydrochlorothiazide (HYDRODIURIL) 25 MG tablet Take 25 mg by mouth daily.   Marland Kitchen HYDROcodone-acetaminophen (NORCO) 5-325 MG per tablet Take 1-2 tablets by mouth every 4 (four) hours as needed for Pain (max 4000mg   tylenol/acetaminophen daily).   . iron-vitamin C (VITRON-C) 65-125 MG Tab Take 1  tablet by mouth daily.   Marland Kitchen LORATADINE PO Take 10 mg by mouth as needed.   . multivitamin (MULTIVITAMIN) Tab Take 1 tablet by mouth daily.   . nebivolol (BYSTOLIC) 5 MG tablet Take 5 mg by mouth daily.   . ondansetron (ZOFRAN) 4 MG tablet Take 1 tablet (4 mg total) by mouth every 6 (six) hours as needed for Nausea.   . potassium chloride (K-DUR,KLOR-CON) 20 MEQ tablet Take 1 tablet (20 mEq total) by mouth 2 (two) times daily.   Marland Kitchen tiotropium (SPIRIVA) 18 MCG inhalation capsule Place 18 mcg into inhaler and inhale daily.   . traMADol (ULTRAM) 50 MG tablet Take 1 tablet (50 mg total) by mouth every 6 (six) hours as needed for Pain.       Review of Systems:   A comprehensive review of systems was: History obtained from the patient  General ROS: positive for  - malaise  Psychological ROS: negative  Respiratory ROS: no cough, shortness of breath, or wheezing  Cardiovascular ROS: no chest pain or dyspnea on exertion  Gastrointestinal ROS: positive for - abdominal pain, appetite loss, change in stools, nausea/vomiting and stool incontinence  Genito-Urinary ROS: no dysuria, trouble voiding, or hematuria  Musculoskeletal ROS: positive for - pain in back - lower and knee - right  Neurological ROS: no TIA or stroke symptoms  Dermatological ROS: chronic right knee wound    Physical Exam:     Filed Vitals:    07/08/14 0800   BP:    Pulse: 91   Temp: 98.4 F (36.9 C)   Resp: 20   SpO2: 95%       Intake and Output Summary (Last 24 hours) at Date Time  No intake or output data in the 24 hours ending 07/08/14 0934    General appearance - alert, well appearing, and in no distress  Mental status - alert, oriented to person, place, and time  Eyes - pupils equal and reactive, extraocular eye movements intact  Neck - supple, no significant adenopathy  Chest - clear to auscultation, no wheezes, rales or rhonchi, symmetric air entry  Heart - normal rate,  regular rhythm, normal S1, S2, no murmurs, rubs, clicks or gallops  Abdomen - soft, nontender, nondistended, no masses or organomegaly  Neurological - alert, oriented, normal speech, no focal findings or movement disorder noted  Extremities - peripheral pulses normal, no pedal edema, no clubbing or cyanosis, right knee immobilizer    Labs:     Results     Procedure Component Value Units Date/Time    MRSA culture [[id number]] Collected:  07/08/14 0516    Specimen Information:  Body Fluid / Nares and Throat Updated:  07/08/14 0536    Type and Screen [[id number]] Collected:  07/08/14 0232    Specimen Information:  Blood Updated:  07/08/14 0330     ABO Rh B POS      AB Screen Gel NEG     Comprehensive Metabolic Panel (CMP) [[id number]]  (Abnormal) Collected:  07/08/14 0232    Specimen Information:  Blood Updated:  07/08/14 0257     Glucose 93 mg/dL      BUN 9 mg/dL      Creatinine 0.7 mg/dL      Sodium 086 mEq/L      Potassium 3.8 mEq/L      Chloride 107 mEq/L      CO2 24 mEq/L      CALCIUM 8.8 mg/dL      Protein, Total 6.6 g/dL  Albumin 2.8 (L) g/dL      AST (SGOT) 16 U/L      ALT 11 U/L      Alkaline Phosphatase 77 U/L      Bilirubin, Total 0.9 mg/dL      Globulin 3.8 (H) g/dL      Albumin/Globulin Ratio 0.7 (L)     GFR [[id number]] Collected:  07/08/14 0232     EGFR >60.0 Updated:  07/08/14 0257    UA, Reflex to Microscopic [[id number]]  (Abnormal) Collected:  07/08/14 0232    Specimen Information:  Urine Updated:  07/08/14 0248     Urine Type Clean Catch      Color, UA Yellow      Clarity, UA Sl Cloudy (A)      Specific Gravity UA 1.011      Urine pH 7.0      Leukocyte Esterase, UA Large (A)      Nitrite, UA Negative      Protein, UR Negative      Glucose, UA Negative      Ketones UA 5 (A)      Urobilinogen, UA Negative mg/dL      Bilirubin, UA Negative      Blood, UA Small (A)      RBC, UA 6 - 10 (A) /hpf      WBC, UA 11 - 25 (A) /hpf      Squamous Epithelial Cells, Urine 0 - 5 /hpf     CBC and differential  [[id number]]  (Abnormal) Collected:  07/08/14 0232    Specimen Information:  Blood / Blood Updated:  07/08/14 0240     WBC 10.37 x10 3/uL      RBC 3.24 (L) x10 6/uL      Hgb 8.5 (L) g/dL      Hematocrit 08.6 (L) %      MCV 81.8 fL      MCH 26.2 (L) pg      MCHC 32.1 g/dL      RDW 15 %      Platelets 221 x10 3/uL      MPV 10.7 fL      Neutrophils 67 %      Lymphocytes Automated 16 %      Monocytes 14 %      Eosinophils Automated 2 %      Basophils Automated 0 %      Immature Granulocyte 1 %      Nucleated RBC 0 /100 WBC      Neutrophils Absolute 6.99 x10 3/uL      Abs Lymph Automated 1.71 x10 3/uL      Abs Mono Automated 1.42 (H) x10 3/uL      Abs Eos Automated 0.23 x10 3/uL      Absolute Baso Automated 0.02 x10 3/uL      Absolute Immature Granulocyte 0.13 (H) x10 3/uL           Recent CBC   Recent Labs      07/08/14   0232   RBC  3.24*   HEMOGLOBIN  8.5*   HEMATOCRIT  26.5*   MCV  81.8   MCH, POC  26.2*   MCHC  32.1   RDW  15   MPV  10.7       Rads:   Radiological Procedure reviewed.  Radiology Results (24 Hour)     ** No results found for the last 24 hours. **  Signed by: San Morelle, MD

## 2014-07-08 NOTE — Progress Notes (Signed)
Severe Sepsis Screen    Date: 07/08/2014 Time: 1:23 PM  Nurse Signature: Domenica Reamer    Exclusions:      Patients meeting the following criteria are excluded from screening:     []  Suspicion or diagnosis of sepsis is documented and until 72 hours after antibiotics started or last regimen change:   - If Yes, Date of Documented Sepsis:                                          - If Yes, Date of last change in antibiotics:                                           []  Surgery - No screening for 24 hours after surgery   - If Yes, Date of Surgery:                                           []  Arctic Sun hypothermia protocol- Resume screening when arctic sun complete   []  Comfort Care Orders- Do not resume screening    Did you check any of the boxes above?     [x]  No, Continue to section A   []  Yes, Stop Here, Patient Excluded from Sepsis Screening. If screening should resume in the future, place "sticky note to treatment team" with date/time of when screening should resume. Communicate patient excluded from screening due to Comfort Care Orders using "sticky note to treatment team."    A. Infection:      Does your patient have ONE or more of the following infection criteria?     []  Documented Infection - Does the patient have positive culture results (from blood, sputum, urine, etc)?   []  Anti-Infective Therapy - Is the patient receiving antibiotic, antifungal, or other anti-infective therapy?   []  Pneumonia - Is there documentation of pneumonia (X Ray, etc)?   []  WBC's - Have WBC's been found in normally sterile fluid (urine, CSF, etc.)?   []  Perforated Viscus - Does the patient have a perforated hollow organ (bowel)?    A.  Did you check any of the boxes above?     [x]  No, Stop Here and Sepsis Screen Negative   []  Yes, continue to section B    B. SIRS:      Does your patient have TWO or more of the following SIRS criteria?     []  Temperature - Is the patient's temperature: Temp: 98.4 F (36.9 C) (07/08/14  0800)   - Greater than or equal to 38.3 degrees C (greater than 100.9 degrees F)?   - Less than or equal to 36 degrees C (less than or equal to 96.8 degrees F)?     []  Heart Rate: Heart Rate: 88 (07/08/14 1000)   - Is the patient's heart rate greater than or equal to 90 bpm?     []  Respiratory: Resp Rate: 15 (07/08/14 1000)   - Is the patient's respiratory rate greater than 20?     []  WBC Count - Is the patient's WBC count:   Recent Labs  Lab 07/08/14  0949   WBC 10.56       -  Greater than or equal to 12,000/mm3 OR   - Less than or equal to 4,000/mm3 OR    - Are there greater than 10% immature neutrophils (bands)?     []  Glucose >140 without diabetes or on steroids?   Recent Labs  Lab 07/08/14  0232   GLUCOSE 93         []  Significant edema is present?    B.  Did you check two or more of the boxes above?     []  No, Stop Here and Sepsis Screen Negative   []  Yes, contact Charge Nurse, continue to section C    C. ACUTE Organ Dysfunction:      Does your patient have ONE or more of the following organ dysfunction? (May need to wait for lab results for assessment - see below) Organ dysfunction must be a result of the sepsis NOT CHRONIC conditions.     []  Cardiovascular - Does the patient have a: BP: 184/74 mmHg (07/08/14 0900)   - Systolic Blood Pressure less than or equal to 90 mmHg OR   - Systolic Blood Pressure has dropped 40 mmHg or more from baseline OR   - Mean Arterial Pressure less than or equal to 70 mmHg (for at least one hour despite fluid resuscitation OR   - require vasopressor support?     []  Respiratory - Does the patient have new hypoxia defined by any of the following?   - A sustained increase in oxygen requirements by at least 2L/min on NC or 28% FiO2 within the last 24 hrs OR   - A persistent decrease in oxygen saturation of greater than or equal to 5% lasting at least four or more hours and occurring within the last 24 hours     []  Renal - Does the patient have:   - low urine output (e.g.  Less than 0.5 mL/kg/HR for one hour despite adequate fluid resuscitation OR   - Increased creatinine (greater than 50% increase from baseline) OR   - require acute dialysis?     []  Hematologic - Does the patient have:   - Low platelet count (less than 100,000 mm3)   Recent Labs  Lab 07/08/14  0949   PLATELETS 208    OR   - INR/aPTT greater than upper limit of normal? No results for input(s): INR in the last 168 hours. OR No results for input(s): APTT in the last 168 hours.     []  Metabolic - Does the patient have a high lactate (plasma lactate greater than or equal to 2.4 mMol/L? No results for input(s): LACTATE in the last 168 hours.     []  Hepatic - Are the patient's liver enzymes elevated (ALT greater than 72 IU/L or Total Bilirubin greater than 2 MG/dL)?   Recent Labs  Lab 07/08/14  0232   BILIRUBIN, TOTAL 0.9   ALT 11         []  CNS - Does the patient have altered consciousness or reduced Glasgow Coma Scale?     Other Active Diagnoses that may be contributing to signs of end organ dysfunction (Ex. Chronic kidney disease, cirrhosis):   - ________________________________________________________________      C.  Did you check any of the boxes above?     []  No, Sepsis Screen Negative   []  YES:  A) Infection + B) SIRS + C) Acute Organ Dysfunction = Positive Screen for Severe Sepsis. Notify attending (house officer during off-hours).     Notify Attending/House Technical sales engineer  and document in Complex Assessment under provider notification   - Name of physician notified:                                           - Date/Time Notifiied:                                             Document actions:   _0  Lactate drawn   _1  Blood Cultures obtained   _2  Antibiotics initiated or modified   _3   IV Fluid administered 0.9% NS __________ mLs given   Nursing Comments/Narrative:    - ______________________________________________________________     The Surviving Sepsis Guidelines recommend the following interventions to be  completed within one hour of a positive sepsis screen.   Obtain new blood cultures prior to antibiotic administration if not done within the last 24 hours.   Obtain lactate level, if initial lactate > 6mol, repeat lactate in 2 hours for goal decrease 10-20%; If there is not a decrease call [facility], hospital  If SBP < 90 or MAP < 65 or lactate greater than 4 mmol/dl; Start 0.9% NS IV Fluid Bolus of 30 mL/Kg (minimum) to maintain MAP > 65    Initiate vasopressors for hypotension not responding to fluid resuscitation (1st line Norepinephrine 1 - 300 mcg/min IV) - Patient must be in CCU if requires vasopressor   Initiate or escalate antibiotic therapy   Goal urine output greater than/equal to 0.5 mL/kg/hr

## 2014-07-09 ENCOUNTER — Encounter
Admission: EM | Disposition: A | Discharge status: Home Health | Payer: Self-pay | Source: Home / Self Care | Attending: Internal Medicine

## 2014-07-09 ENCOUNTER — Inpatient Hospital Stay: Payer: No Typology Code available for payment source | Admitting: Pain Medicine

## 2014-07-09 DIAGNOSIS — K922 Gastrointestinal hemorrhage, unspecified: Secondary | ICD-10-CM

## 2014-07-09 HISTORY — PX: EGD: SHX3789

## 2014-07-09 LAB — CBC
Hematocrit: 25.2 % — ABNORMAL LOW (ref 37.0–47.0)
Hgb: 8 g/dL — ABNORMAL LOW (ref 12.0–16.0)
MCH: 26 pg — ABNORMAL LOW (ref 28.0–32.0)
MCHC: 31.7 g/dL — ABNORMAL LOW (ref 32.0–36.0)
MCV: 81.8 fL (ref 80.0–100.0)
MPV: 9.9 fL (ref 9.4–12.3)
Nucleated RBC: 0 /100 WBC (ref 0–1)
Platelets: 211 10*3/uL (ref 140–400)
RBC: 3.08 10*6/uL — ABNORMAL LOW (ref 4.20–5.40)
RDW: 15 % (ref 12–15)
WBC: 9.49 10*3/uL (ref 3.50–10.80)

## 2014-07-09 LAB — STOOL OCCULT BLOOD: Stool Occult Blood: POSITIVE — AB

## 2014-07-09 LAB — RED BLOOD CELLS OR HOLD: RBC Leukoreduced: TRANSFUSED

## 2014-07-09 SURGERY — DONT USE, USE 1095-ESOPHAGOGASTRODUODENOSCOPY (EGD), DIAGNOSTIC
Anesthesia: Anesthesia General | Site: Mouth | Wound class: Clean Contaminated

## 2014-07-09 MED ORDER — DIPHENHYDRAMINE HCL 25 MG PO CAPS
25.0000 mg | ORAL_CAPSULE | Freq: Once | ORAL | Status: DC | PRN
Start: 2014-07-09 — End: 2014-07-13

## 2014-07-09 MED ORDER — CHLORHEXIDINE GLUCONATE 0.12 % MT SOLN
10.0000 mL | Freq: Two times a day (BID) | OROMUCOSAL | Status: DC
Start: 2014-07-09 — End: 2014-07-13
  Administered 2014-07-09 – 2014-07-13 (×8): 10 mL via OROMUCOSAL
  Filled 2014-07-09 (×6): qty 10
  Filled 2014-07-09: qty 15
  Filled 2014-07-09: qty 10
  Filled 2014-07-09: qty 15

## 2014-07-09 MED ORDER — PANTOPRAZOLE SODIUM 40 MG IV SOLR
40.0000 mg | Freq: Every day | INTRAVENOUS | Status: DC
Start: 2014-07-09 — End: 2014-07-09

## 2014-07-09 MED ORDER — LACTATED RINGERS IV SOLN
INTRAVENOUS | Status: DC
Start: 2014-07-09 — End: 2014-07-10

## 2014-07-09 MED ORDER — LIDOCAINE HCL 2 % IJ SOLN
INTRAMUSCULAR | Status: DC | PRN
Start: 2014-07-09 — End: 2014-07-09
  Administered 2014-07-09: 2.5 mL

## 2014-07-09 MED ORDER — MUPIROCIN CALCIUM 2 % NA OINT
TOPICAL_OINTMENT | Freq: Two times a day (BID) | NASAL | Status: DC
Start: 2014-07-09 — End: 2014-07-13
  Administered 2014-07-09 – 2014-07-13 (×8): 1 via NASAL
  Filled 2014-07-09 (×8): qty 1

## 2014-07-09 MED ORDER — LABETALOL HCL 5 MG/ML IV SOLN
10.0000 mg | INTRAVENOUS | Status: DC | PRN
Start: 2014-07-09 — End: 2014-07-13
  Administered 2014-07-09 – 2014-07-10 (×3): 10 mg via INTRAVENOUS
  Filled 2014-07-09 (×2): qty 4

## 2014-07-09 MED ORDER — LIDOCAINE HCL (PF) 2 % IJ SOLN
INTRAMUSCULAR | Status: AC
Start: 2014-07-09 — End: ?
  Filled 2014-07-09: qty 5

## 2014-07-09 MED ORDER — GLYCOPYRROLATE 0.2 MG/ML IJ SOLN
INTRAMUSCULAR | Status: AC
Start: 2014-07-09 — End: ?
  Filled 2014-07-09: qty 1

## 2014-07-09 MED ORDER — EPHEDRINE SULFATE 50 MG/ML IJ SOLN
INTRAMUSCULAR | Status: AC
Start: 2014-07-09 — End: ?
  Filled 2014-07-09: qty 1

## 2014-07-09 MED ORDER — PROPOFOL INFUSION 10 MG/ML
INTRAVENOUS | Status: DC | PRN
Start: 2014-07-09 — End: 2014-07-09
  Administered 2014-07-09 (×2): 25 mg via INTRAVENOUS

## 2014-07-09 MED ORDER — LACTATED RINGERS IV SOLN
INTRAVENOUS | Status: DC | PRN
Start: 2014-07-09 — End: 2014-07-09

## 2014-07-09 MED ORDER — FENTANYL CITRATE 0.05 MG/ML IJ SOLN
INTRAMUSCULAR | Status: DC | PRN
Start: 2014-07-09 — End: 2014-07-09
  Administered 2014-07-09 (×2): 50 ug via INTRAVENOUS

## 2014-07-09 MED ORDER — FENTANYL CITRATE 0.05 MG/ML IJ SOLN
INTRAMUSCULAR | Status: AC
Start: 2014-07-09 — End: ?
  Filled 2014-07-09: qty 2

## 2014-07-09 MED ORDER — PANTOPRAZOLE SODIUM 40 MG PO TBEC
40.0000 mg | DELAYED_RELEASE_TABLET | Freq: Every morning | ORAL | Status: DC
Start: 2014-07-09 — End: 2014-07-13
  Administered 2014-07-09 – 2014-07-13 (×5): 40 mg via ORAL
  Filled 2014-07-09 (×5): qty 1

## 2014-07-09 MED ORDER — GLYCOPYRROLATE 0.2 MG/ML IJ SOLN
INTRAMUSCULAR | Status: DC | PRN
Start: 2014-07-09 — End: 2014-07-09
  Administered 2014-07-09: 0.2 mg via INTRAVENOUS

## 2014-07-09 MED ORDER — PROPOFOL 10 MG/ML IV EMUL
INTRAVENOUS | Status: AC
Start: 2014-07-09 — End: ?
  Filled 2014-07-09: qty 50

## 2014-07-09 MED ORDER — SUCRALFATE 1 GM/10ML PO SUSP
1.0000 g | Freq: Four times a day (QID) | ORAL | Status: DC
Start: 2014-07-09 — End: 2014-07-13
  Administered 2014-07-09 – 2014-07-13 (×14): 1 g via ORAL
  Filled 2014-07-09 (×14): qty 10

## 2014-07-09 SURGICAL SUPPLY — 66 items
BALLOON FEEDING 24FR X 4.4CM (Endoscopic Supplies) IMPLANT
BALN DILATOR 18-20MM 8CM (Balloon) IMPLANT
BELT RB FM UNV 40X6IN LF NS SLD PNL ELC (Patient Supply) ×2
BELT RIB UNIVERSAL L40 IN X W6 IN FOAM DEROYAL SOLID PANEL ELASTIC (Patient Supply) ×1 IMPLANT
BLOCK BITE MAXI 60FR LF STRD STRAP SDPRT (Procedure Accessories) ×1
BLOCK BITE OD60 FR STURDY STRAP SIDEPORT (Procedure Accessories) ×1
BLOCK BITE OD60 FR STURDY STRAP SIDEPORT DENTAL RETENTION RIM MAXI (Procedure Accessories) ×1 IMPLANT
BRUSH CYTO CNMD 3MM 2.1MM 230CM LF STRL (Brushes)
BRUSH CYTOLOGY 230CM SHTH BRSTL PSTV RING 3MM 2.1MM CONMED COLONOSCOPE (Brushes) IMPLANT
BRUSH CYTOLOGY L230 CM SHEATH BRISTLE (Brushes)
BUTTON GSTRM SIL 5ML MCKY SECUR-LOK 20FR (Tubing)
BUTTON MIC-KEY SECUR-LOK GASTROSTOMY (Tubing)
BUTTON MIC-KEY SECUR-LOK GASTROSTOMY OD20 FR L3.5 CM 5 ML RADIOPAQUE (Tubing) IMPLANT
CATHETER BALLOON DILATATION CRE 2.8 MM (Dilator)
CATHETER BALLOON DILATATION CRE PEBAX (Balloon) IMPLANT
CATHETER OD15-16.5-18 MM ODSEC6 FR L180 CM CREâ„¢ BALLOON DILATATION L8 (Dilator) IMPLANT
CATHETER OD6 FR ODSEC12-13.5-15 MM L180 CM CREâ„¢ BALLOON DILATATION L8 (Balloon) IMPLANT
CLIP QUICKCLIP2 GI FIXATN LONG (Clip) IMPLANT
DEVICE MEASURING OTW MEASURE MIC-KEY* (Prep) IMPLANT
DEVICE MSR MCKY LF STRL OTW MSR DISP (Prep)
DILATOR ESCP PEBAX 2.8MM CRE 15-16.5-18 (Dilator)
DILATOR ESCP PEBAX CRE 6FR 12-13.5-15MM (Balloon) IMPLANT
FORCEPS BIOPSY L240 CM JUMBO MICROMESH (Instrument)
FORCEPS BIOPSY L240 CM JUMBO MICROMESH TEETH STREAMLINE CATHETER (Instrument) IMPLANT
FORCEPS BIOPSY L240 CM LARGE CAPACITY (Instrument)
FORCEPS BIOPSY L240 CM MICROMESH TEETH STREAMLINE CATHETER NEEDLE (Instrument) IMPLANT
FORCEPS BIOPSY L240 CM STANDARD CAPACITY (Instrument)
FORCEPS BX SS JMB RJ 4 2.8MM 240CM STRL (Instrument)
FORCEPS BX SS LG CPC RJ 4 2.4MM 240CM (Instrument)
FORCEPS BX STD CPC RJ 4 2.2MM 240CM STRL (Instrument)
GOWN CHEMOTHERAPY L47 IN X W28 IN UNIVERSAL OPEN BACK OVERHEAD KNIT (Gown) ×2 IMPLANT
GOWN CHMO PP PE UNV 47X28IN LF OPN BCK (Gown) ×4
KIT UNIVERSAL IRRIGATION SOL (Kits) ×2 IMPLANT
NEEDLE CARR-LOCKE INJECT 25GX5 (Needles) IMPLANT
NET SPEC RTRVL STD RTHNT 2.5MM 230CM LF (Urology Supply)
NET SPEC RTRVL UNV RTHNT PLTN 2.5MM 230 (GE Lab Supplies) IMPLANT
NET SPECIMEN RETRIEVAL L230 CM STANDARD (Urology Supply)
NET SPECIMEN RETRIEVAL L230 CM STANDARD SHEATH OD2.5 MM L6 CM X W3 CM (Urology Supply) IMPLANT
OVERTUBE ENDOSCOPIC L25 CM STANDARD (Endoscopic Supplies)
OVERTUBE ENDOSCOPIC L25 CM STANDARD TAPER INSUFFLATION CAP OD19.5 MM (Endoscopic Supplies) IMPLANT
OVERTUBE ESCP STD TPR GUARDUS 19.5MM (Endoscopic Supplies)
PAD ELECTROSRG GRND REM W CRD (Procedure Accessories) IMPLANT
PROBE COAG FIAPC 6.9FR 7.2FT CRCMF PLG (Blade)
PROBE COAGULATION L7.2 FT (Blade)
PROBE COAGULATION L7.2 FT CIRCUMFERENTIAL PLUG PLAY FUNCTIONALITY (Blade) IMPLANT
PROBE ELECTROSURGICAL L220 CM FLEXIBLE (Procedure Accessories)
PROBE ELECTROSURGICAL L220 CM FLEXIBLE STRAIGHT FIRE OD2.3 MM FIAPC (Procedure Accessories) IMPLANT
PROBE ESURG FIAPC 2.3MM 220CM STRL FLXB (Procedure Accessories)
SNARE ELECTRO SURG 20MM (Procedure Accessories) IMPLANT
SOLN LUBRICATING JELLY 4.25OZ (Irrigation Solutions) ×2 IMPLANT
SPONGE GAUZE L4 IN X W4 IN 16 PLY (Dressing) ×1
SPONGE GAUZE L4 IN X W4 IN 16 PLY MAXIMUM ABSORBENT USP TYPE VII (Dressing) ×1 IMPLANT
SPONGE GZE CTTN CRTY 4X4IN LF NS 16 PLY (Dressing) ×1
SYRINGE 50 ML GRADUATE NONPYROGENIC DEHP (Syringes, Needles)
SYRINGE 50 ML GRADUATE NONPYROGENIC DEHP FREE PVC FREE BD MEDICAL (Syringes, Needles) IMPLANT
SYRINGE INFL 60ML ALN II STRL GA DISP (Syringes, Needles)
SYRINGE INFLATION 60 ML GAUGE CRE (Syringes, Needles) IMPLANT
SYRINGE MED 50ML LF STRL GRAD N-PYRG (Syringes, Needles)
TUBE GSTRM STRG EVV 24FR REPL BLN STD PR (Tube) IMPLANT
TUBE OD24 FR STRAIGHT ENDOVIVE (Tube) IMPLANT
TUBE OD24 FR STRAIGHT ENDOVIVEâ„¢ REPLACEMENT BALLOON STANDARD PROFILE (Tube) IMPLANT
WATER STERILE PLASTIC POUR BOTTLE 1000 (Irrigation Solutions) ×1
WATER STERILE PLASTIC POUR BOTTLE 1000 ML (Irrigation Solutions) ×1 IMPLANT
WATER STERILE PLASTIC POUR BOTTLE 250 ML (Irrigation Solutions) ×1 IMPLANT
WATER STRL 1000ML LF PLS PR BTL (Irrigation Solutions) ×1
WATER STRL 250ML LF PLS PR BTL (Irrigation Solutions) ×1

## 2014-07-09 NOTE — Progress Notes (Signed)
INFECTIOUS DISEASES PROGRESS NOTE    Date Time: 07/09/2014 5:14 PM  Patient Name: [name],[name]      Assessment and Recommendations:     Patient Active Problem List   Diagnosis   . Total knee replacement status   . Low back pain   . Chronic obstructive pulmonary disease   . Hypertension   . Debility following multiple R knee revisions   . Non-healing surgical wound- right knee    . Fever   . Leukocytosis   . Anemia   . Hypokalemia   . Chronic infection of prosthetic knee   . COPD (chronic obstructive pulmonary disease)   . HTN (hypertension)   . Infection   . Osteoarthritis   . GI bleed     78 yo female admitted with melena.  egd 10/1  With gastritis, superficial ulceration.    H/h has been stable, and no signs of active  Bleeding.     HX COMPLICATED RIGHT KNEE  PJI and soft tissue infection   - proteus pji initially  -group c strep/esbl soft tissue infection associated with gastro flap   -gbs bacteremia 6/15 - source unclear - tx'd with iv thru 04/10/14    S/p reimplant right knee on 07/01/14.  OR cxs   Negative.  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 Difficile.  - maintain vigilance       Will order peridex mouth wash bid and resume bactroban ointment to  Nares.      Continue zosyn pending urine cx results.  This will serve as suppresion For the knee as well perioperatively    Start vanco perioperatively - given colonization with mrsa.     As discussed with ortho and plastics below -   Given slight dehiscence and bleeding from the knee wound  I and D, plastics closure is planned for 10/2        Subjective:   Pt seen c ortho fellow.  Discussed with ortho and dr bhatt.  Some concern re slight skin dehiscence at   Inferior aspect of wound.      Antibiotics:   Zosyn    Medications:     Current Facility-Administered Medications   Medication Dose Route Frequency   . amLODIPine  5 mg  Oral Daily   . bisoprolol  5 mg Oral Daily   . cloNIDine  0.1 mg Oral Daily   . gabapentin  300 mg Oral Daily   . pantoprazole  40 mg Oral QAM AC   . piperacillin-tazobactam  4.5 g Intravenous Q8H SCH   . sucralfate  1 g Oral TID AC & HS   . tiotropium  18 mcg Inhalation QAM       Central Access/Endovascular Hardware:     Active PICC Line / CVC Line / PIV Line / Drain / Airway / Intraosseous Line / Epidural Line / ART Line / Line / Wound / Pressure Ulcer / NG/OG Tube     Name:   Placement date:   Placement time:   Site:   Days:    PICC Single Lumen 03/24/14 Right Basilic  03/24/14   1518   Basilic   107    Peripheral IV 07/08/14 Right Antecubital  07/08/14   0243   Antecubital   1  Review of Systems:   Patient is without fevers, chills, nausea, vomiting, diarrhea, abdominal pain, cough, dyspnea, headache, rash.  Slight bleeding from lower aspect of knee wound     Physical Exam:     Filed Vitals:    07/09/14 1555 07/09/14 1600 07/09/14 1605 07/09/14 1615   BP: 169/75 177/76 196/79 188/79   Pulse: 74 75 74 75   Temp:       TempSrc:       Resp: 14 20 16 17    Height:       Weight:       SpO2: 99% 100% 96% 95%       Temp (24hrs), Avg:98.3 F (36.8 C), Min:97.8 F (36.6 C), Max:98.6 F (37 C)        In no apparent distress  Sclera anicteric, conjunctivae clear  Oral pharynx s erythema, exudate, or other significant lesions  No Thrush  Neck supple, s lymphadenopathy  Lungs Clear, bibas rales   Heart Regular rate and rhythm s murmurs, rubs, or gallops  Abdomen Soft, non tender, non distended, normal bowel sounds  Back No Costovertebral angle tenderness, no point tenderness along the spine  Extremities No Cyanosis, no clubbing,   Trace edema right leg.    Right knee wound  With 2 small areas of  Slight supeficial dehiscence - mild bleeding  Palpable pedal pulses  Neurologic exam  Awake, alert, oriented post egd  Skin without rash     Labs:     Results     Procedure Component Value Units Date/Time    MRSA culture  [[id number]] Collected:  07/08/14 0516    Specimen Information:  Body Fluid / Nasal/Throat ASC Admission Updated:  07/09/14 1000    Narrative:      ORDER#: [id number]                                    ORDERED BY: Elyn Peers  SOURCE: Nares and Throat                             COLLECTED:  07/08/14 05:16  ANTIBIOTICS AT COLL.:                                RECEIVED :  07/08/14 10:50  Culture MRSA Surveillance                  FINAL       07/09/14 10:00   +  07/09/14   Positive for Methicillin Resistant Staph aureus from Throat             Negative for Methicillin Resistant Staph aureus from Nares      CBC without differential [[id number]]  (Abnormal) Collected:  07/09/14 0702    Specimen Information:  Blood / Blood Updated:  07/09/14 0714     WBC 9.49 x10 3/uL      RBC 3.08 (L) x10 6/uL      Hgb 8.0 (L) g/dL      Hematocrit 84.6 (L) %      MCV 81.8 fL      MCH 26.0 (L) pg      MCHC 31.7 (L) g/dL      RDW 15 %      Platelets 211 x10 3/uL      MPV 9.9  fL      Nucleated RBC 0 /100 WBC     Stool Occult Blood [[id number]]  (Abnormal) Collected:  07/09/14 0500    Specimen Information:  Stool Updated:  07/09/14 0527     Stool Occult Blood Positive (A)     Urine culture [[id number]] Collected:  07/08/14 1640    Specimen Information:  Urine / Urine, Clean Catch Updated:  07/08/14 2020          Recent Labs  Lab 07/09/14  0702   WBC 9.49   HEMOGLOBIN 8.0*   HEMATOCRIT 25.2*   PLATELETS 211        Recent Labs  Lab 07/08/14  0232   SODIUM 143   POTASSIUM 3.8   CHLORIDE 107   CO2 24   BUN 9   CREATININE 0.7   CALCIUM 8.8   ALBUMIN 2.8*   PROTEIN, TOTAL 6.6   BILIRUBIN, TOTAL 0.9   ALKALINE PHOSPHATASE 77   ALT 11   AST (SGOT) 16   GLUCOSE 93     No results for input(s): INR, APTT in the last 168 hours.       Rads:     Radiology Results (24 Hour)     ** No results found for the last 24 hours. **          Signed by: Ray Church, MD

## 2014-07-09 NOTE — Anesthesia Preprocedure Evaluation (Addendum)
Anesthesia Evaluation    AIRWAY    Mallampati: II    TM distance: >3 FB  Neck ROM: full  Mouth Opening:full   CARDIOVASCULAR    cardiovascular exam normal       DENTAL    no notable dental hx     PULMONARY    pulmonary exam normal     OTHER FINDINGS    Hypertension   Chronic obstructive pulmonary disease - PCO2 50  Anemia of acute bloodloss- GI bleeding  Morbid obesity BMI 39.6    03/26/14 1633 Echocardiogram Adult Transesophageal W/Clr    Impression:     1. No valvular vegetation  2. Normal left ventricular systolic function     S/p TKA revision reimplantation 07/01/14 - on amoxacillin    Cbc, bmp, coag, ecg reviewed. Current meds and allergies reviewed.  Cleared for surgery by medicine.              Anesthesia Plan    ASA 3     general                     intravenous induction           Post op pain management: per surgeon    informed consent obtained    Plan discussed with CRNA.

## 2014-07-09 NOTE — Progress Notes (Addendum)
PROGRESS NOTE    Date Time: 07/09/2014 2:08 PM  Patient Name: [name], [name]      Assessment/ Plan:   1) Epigastric Pain with nausea and vomiting, positive occult blood in stool, likely from gastritis - cont to hold NSAIDs, EGD today. Resume diet after EGD. Continue protonix IV.   2) Stable Chronic Anemia - monitor.   3) PJI, S/p TKA revision reimplantation 07/01/14 - on amoxacillin, ID following  4) Hx Non-healing surgical wound- right knee, poor wound healing  5) Hypertension - BP elevated, continue home medications. Will adjust if persistently elevated.   6) COPD, stable  7) ESBL, MRSA hx -on isolation     Disposition - possible [place] tomorrow.       Subjective:   Feels hungry. Still has mild epigastric discomfort. No melena.     Medications:     Current Facility-Administered Medications   Medication Dose Route Frequency   . amLODIPine  5 mg Oral Daily   . bisoprolol  5 mg Oral Daily   . cloNIDine  0.1 mg Oral Daily   . gabapentin  300 mg Oral Daily   . piperacillin-tazobactam  4.5 g Intravenous Q8H SCH   . tiotropium  18 mcg Inhalation QAM       Review of Systems:   A comprehensive review of systems was: History obtained from the patient  General ROS: negative  Psychological ROS: negative  Respiratory ROS: no cough, shortness of breath, or wheezing  Cardiovascular ROS: no chest pain or dyspnea on exertion  Gastrointestinal ROS: positive for - abdominal pain  Genito-Urinary ROS: no dysuria, trouble voiding, or hematuria  Musculoskeletal ROS: positive for - joint pain  Neurological ROS: no TIA or stroke symptoms  Dermatological ROS: negative    Physical Exam:     Filed Vitals:    07/09/14 1357   BP: 172/72   Pulse: 69   Temp: 97.8 F (36.6 C)   Resp: 18   SpO2: 96%       Intake and Output Summary (Last 24 hours) at Date Time    Intake/Output Summary (Last 24 hours) at 07/09/14 1408  Last data filed at 07/09/14 0340   Gross per 24 hour   Intake      0 ml   Output      6 ml   Net     -6 ml       General appearance -  alert, well appearing, and in no distress  Mental status - alert, oriented to person, place, and time  Eyes - pupils equal and reactive, extraocular eye movements intact  Neck - supple, no significant adenopathy  Chest - clear to auscultation, no wheezes, rales or rhonchi, symmetric air entry  Heart - normal rate, regular rhythm, normal S1, S2, no murmurs, rubs, clicks or gallops  Abdomen - soft, nontender, nondistended, no masses or organomegaly  Neurological - alert, oriented, normal speech, no focal findings or movement disorder noted  Extremities - peripheral pulses normal, no pedal edema, no clubbing or cyanosis, right knee immobilizer    Labs:     Results     Procedure Component Value Units Date/Time    MRSA culture [[id number]] Collected:  07/08/14 0516    Specimen Information:  Body Fluid / Nasal/Throat ASC Admission Updated:  07/09/14 1000    Narrative:      ORDER#: [id number]  ORDERED BY: CARTER, [place]  SOURCE: Nares and Throat                             COLLECTED:  07/08/14 05:16  ANTIBIOTICS AT COLL.:                                RECEIVED :  07/08/14 10:50  Culture MRSA Surveillance                  FINAL       07/09/14 10:00   +  07/09/14   Positive for Methicillin Resistant Staph aureus from Throat             Negative for Methicillin Resistant Staph aureus from Nares      CBC without differential [[id number]]  (Abnormal) Collected:  07/09/14 0702    Specimen Information:  Blood / Blood Updated:  07/09/14 0714     WBC 9.49 x10 3/uL      RBC 3.08 (L) x10 6/uL      Hgb 8.0 (L) g/dL      Hematocrit 40.9 (L) %      MCV 81.8 fL      MCH 26.0 (L) pg      MCHC 31.7 (L) g/dL      RDW 15 %      Platelets 211 x10 3/uL      MPV 9.9 fL      Nucleated RBC 0 /100 WBC     Stool Occult Blood [[id number]]  (Abnormal) Collected:  07/09/14 0500    Specimen Information:  Stool Updated:  07/09/14 0527     Stool Occult Blood Positive (A)     Urine culture [[id number]] Collected:  07/08/14  1640    Specimen Information:  Urine / Urine, Clean Catch Updated:  07/08/14 2020              Rads:   Radiological Procedure reviewed  Radiology Results (24 Hour)     ** No results found for the last 24 hours. **        Signed by: Pearletha Forge, MD

## 2014-07-09 NOTE — Consults (Signed)
GI CONSULTATION    Date Time: 07/09/2014 2:01 PM  Patient Name: [name]  Requesting Physician: Rondel Baton, MD      Reason for Consultation:   Melanotic stool, epigastric pain, N&V   Assessment:   UGI bleed    Plan:   EGD    History:   [name] is a 78 y.o. female who presents to the hospital on 07/08/2014 with black stools after a recent operation, s/p TKA revision,Hb is 8.5 and she has epigastric pain and jnausea reimplantation.    Past Medical History:     Past Medical History   Diagnosis Date   . Hypertension    . Pneumonia    . Chronic obstructive pulmonary disease    . Headache(784.0)      not since surgery in 1957   . Arthritis    . Difficulty in walking(719.7)    . Low back pain    . Urinary tract infection    . Depression        Past Surgical History:     Past Surgical History   Procedure Laterality Date   . Cervical spine surgery     . Lumbar spine surgery  posterior w/ hardware   . Tonsillectomy     . Brain surgery  R side/ 1957 Optic nerve pressure   . Eye surgery       cataracts   . Colonoscopy     . Tubal ligation  1961   . Arthroplasty, knee, total  08/25/2013     Procedure: ARTHROPLASTY, KNEE, TOTAL;  Surgeon: Lane Hacker, MD;  Location: [facility];  Service: Orthopedics;  Laterality: Right;   . Arthroplasty, knee, revision total  09/05/2013     Procedure: ARTHROPLASTY, KNEE, REVISION TOTAL;  Surgeon: Lane Hacker, MD;  Location: [facility];  Service: Orthopedics;  Laterality: Right;  RT KNEE WASHOUT & POLY EXCHANGE **POA/CH** (STRYKER)    . Arthroplasty, knee, resection  09/15/2013     Procedure: ARTHROPLASTY, KNEE, RESECTION;  Surgeon: Lane Hacker, MD;  Location: [facility];  Service: Orthopedics;  Laterality: Right;  AND PLACEMENT OF ANTIBIOTIC SPACER  Placement of wound vac   . Arthroplasty, knee, revision total  09/22/2013     Procedure: ARTHROPLASTY, KNEE, REVISION TOTAL;  Surgeon: Lane Hacker, MD;  Location: [facility];  Service: Orthopedics;   Laterality: Right;   . Debridement, muscle flap closure  09/22/2013     Procedure: DEBRIDEMENT, MUSCLE FLAP CLOSURE;  Surgeon: Ernie Avena, MD;  Location: [facility];  Service: Plastics;  Laterality: Right;  DEBRIDEMENT & MUSCLE FLAP KNEE RT   . Graft, skin  split thickness, (medical)  09/22/2013     Procedure: GRAFT, SKIN  SPLIT THICKNESS, (MEDICAL);  Surgeon: Ernie Avena, MD;  Location: [facility];  Service: Plastics;  Laterality: N/A;   . Echocardiogram, transesophageal N/A 03/26/2014     Procedure: ECHOCARDIOGRAM, TRANSESOPHAGEAL;  Surgeon: Marni Griffon, MD;  Location: [facility];  Service: Cardiology;  Laterality: N/A;   . Arthroplasty, knee, revision total Right 07/01/2014     Procedure: ARTHROPLASTY, KNEE, REVISION TOTAL;  Surgeon: Lane Hacker, MD;  Location: [facility];  Service: Orthopedics;  Laterality: Right;       Family History:   History reviewed. No pertinent family history.    Social History:     History     Social History   . Marital Status: Widowed  Spouse Name: N/A     Number of Children: N/A   . Years of Education: N/A     Social History Main Topics   . Smoking status: Former Smoker -- 1.00 packs/day for 45 years     Quit date: 08/20/1998   . Smokeless tobacco: Never Used   . Alcohol Use: Yes      Comment: occassionally   . Drug Use: No   . Sexual Activity: Not on file     Other Topics Concern   . Not on file     Social History Narrative       Allergies:   No Known Allergies    Medications:     Current Facility-Administered Medications   Medication Dose Route Frequency   . amLODIPine  5 mg Oral Daily   . bisoprolol  5 mg Oral Daily   . cloNIDine  0.1 mg Oral Daily   . gabapentin  300 mg Oral Daily   . piperacillin-tazobactam  4.5 g Intravenous Q8H SCH   . tiotropium  18 mcg Inhalation QAM       Review of Systems:   A comprehensive review of systems was: Negative except as noted.    Physical Exam:     Filed Vitals:    07/09/14 1357   BP: 172/72   Pulse: 69    Temp: 97.8 F (36.6 C)   Resp: 18   SpO2: 96%       Intake and Output Summary (Last 24 hours) at Date Time    Intake/Output Summary (Last 24 hours) at 07/09/14 1401  Last data filed at 07/09/14 0340   Gross per 24 hour   Intake      0 ml   Output      6 ml   Net     -6 ml       Physical Exam: Pleasant alert 78 yo woman in NAD. GI/Liver exam normal.    Labs Reviewed:     Results     Procedure Component Value Units Date/Time    MRSA culture [[id number]] Collected:  07/08/14 0516    Specimen Information:  Body Fluid / Nasal/Throat ASC Admission Updated:  07/09/14 1000    Narrative:      ORDER#: [id number]                                    ORDERED BY: Elyn Peers  SOURCE: Nares and Throat                             COLLECTED:  07/08/14 05:16  ANTIBIOTICS AT COLL.:                                RECEIVED :  07/08/14 10:50  Culture MRSA Surveillance                  FINAL       07/09/14 10:00   +  07/09/14   Positive for Methicillin Resistant Staph aureus from Throat             Negative for Methicillin Resistant Staph aureus from Nares      CBC without differential [[id number]]  (Abnormal) Collected:  07/09/14 0702    Specimen Information:  Blood / Blood Updated:  07/09/14 0865  WBC 9.49 x10 3/uL      RBC 3.08 (L) x10 6/uL      Hgb 8.0 (L) g/dL      Hematocrit 95.6 (L) %      MCV 81.8 fL      MCH 26.0 (L) pg      MCHC 31.7 (L) g/dL      RDW 15 %      Platelets 211 x10 3/uL      MPV 9.9 fL      Nucleated RBC 0 /100 WBC     Stool Occult Blood [[id number]]  (Abnormal) Collected:  07/09/14 0500    Specimen Information:  Stool Updated:  07/09/14 0527     Stool Occult Blood Positive (A)     Urine culture [[id number]] Collected:  07/08/14 1640    Specimen Information:  Urine / Urine, Clean Catch Updated:  07/08/14 2020              Rads:   Radiological Procedure reviewed.     Signed by: Leory Plowman., MD

## 2014-07-09 NOTE — Transfer of Care (Signed)
Anesthesia Transfer of Care Note    Patient: [name]    Procedures performed: Procedure(s):  EGD    Anesthesia type: General TIVA    Patient location:Phase I PACU    Last vitals:   Filed Vitals:    07/09/14 1552   BP: 186/74   Pulse: 74   Temp: 36.8 C (98.2 F)   Resp: 15   SpO2: 99%       Post pain: Patient not complaining of pain, continue current therapy      Mental Status:awake    Respiratory Function: tolerating nasal cannula    Cardiovascular: stable    Nausea/Vomiting: patient not complaining of nausea or vomiting    Hydration Status: adequate    Post assessment: no apparent anesthetic complications

## 2014-07-09 NOTE — Progress Notes (Signed)
Severe Sepsis Screen    Date: 07/09/2014 Time: 8:29 AM  Nurse Signature: Chriss Driver    Exclusions:      Patients meeting the following criteria are excluded from screening:     []  Suspicion or diagnosis of sepsis is documented and until 72 hours after antibiotics started or last regimen change:   - If Yes, Date of Documented Sepsis:                                          - If Yes, Date of last change in antibiotics:                                           []  Surgery - No screening for 24 hours after surgery   - If Yes, Date of Surgery:                                           []  Arctic Sun hypothermia protocol- Resume screening when arctic sun complete   []  Comfort Care Orders- Do not resume screening    Did you check any of the boxes above?     [x]  No, Continue to section A   []  Yes, Stop Here, Patient Excluded from Sepsis Screening. If screening should resume in the future, place "sticky note to treatment team" with date/time of when screening should resume. Communicate patient excluded from screening due to Comfort Care Orders using "sticky note to treatment team."    A. Infection:      Does your patient have ONE or more of the following infection criteria?     [x]  Documented Infection - Does the patient have positive culture results (from blood, sputum, urine, etc)?   [x]  Anti-Infective Therapy - Is the patient receiving antibiotic, antifungal, or other anti-infective therapy?   []  Pneumonia - Is there documentation of pneumonia (X Ray, etc)?   []  WBC's - Have WBC's been found in normally sterile fluid (urine, CSF, etc.)?   []  Perforated Viscus - Does the patient have a perforated hollow organ (bowel)?    A.  Did you check any of the boxes above?     []  No, Stop Here and Sepsis Screen Negative   [x]  Yes, continue to section B    B. SIRS:      Does your patient have TWO or more of the following SIRS criteria?     []  Temperature - Is the patient's temperature: Temp: 98.6 F (37 C) (07/09/14  0610)   - Greater than or equal to 38.3 degrees C (greater than 100.9 degrees F)?   - Less than or equal to 36 degrees C (less than or equal to 96.8 degrees F)?     []  Heart Rate: Heart Rate: 74 (07/09/14 0610)   - Is the patient's heart rate greater than or equal to 90 bpm?    []  Respiratory: Resp Rate: (!) 11 (07/09/14 0610)   - Is the patient's respiratory rate greater than 20?     []  WBC Count - Is the patient's WBC count:   Recent Labs  Lab 07/09/14  0702   WBC 9.49       -  Greater than or equal to 12,000/mm3 OR   - Less than or equal to 4,000/mm3 OR    - Are there greater than 10% immature neutrophils (bands)?     []  Glucose >140 without diabetes or on steroids?   Recent Labs  Lab 07/08/14  0232   GLUCOSE 93         []  Significant edema is present?    B.  Did you check two or more of the boxes above?     [x]  No, Stop Here and Sepsis Screen Negative   []  Yes, contact Charge Nurse, continue to section C    C. ACUTE Organ Dysfunction:      Does your patient have ONE or more of the following organ dysfunction? (May need to wait for lab results for assessment - see below) Organ dysfunction must be a result of the sepsis NOT CHRONIC conditions.     []  Cardiovascular - Does the patient have a: BP: 164/74 mmHg (07/09/14 0610)   - Systolic Blood Pressure less than or equal to 90 mmHg OR   - Systolic Blood Pressure has dropped 40 mmHg or more from baseline OR   - Mean Arterial Pressure less than or equal to 70 mmHg (for at least one hour despite fluid resuscitation OR   - require vasopressor support?     []  Respiratory - Does the patient have new hypoxia defined by any of the following?   - A sustained increase in oxygen requirements by at least 2L/min on NC or 28% FiO2 within the last 24 hrs OR   - A persistent decrease in oxygen saturation of greater than or equal to 5% lasting at least four or more hours and occurring within the last 24 hours     []  Renal - Does the patient have:   - low urine output (e.g.  Less than 0.5 mL/kg/HR for one hour despite adequate fluid resuscitation OR   - Increased creatinine (greater than 50% increase from baseline) OR   - require acute dialysis?     []  Hematologic - Does the patient have:   - Low platelet count (less than 100,000 mm3)   Recent Labs  Lab 07/09/14  0702   PLATELETS 211    OR   - INR/aPTT greater than upper limit of normal? No results for input(s): INR in the last 168 hours. OR No results for input(s): APTT in the last 168 hours.     []  Metabolic - Does the patient have a high lactate (plasma lactate greater than or equal to 2.4 mMol/L? No results for input(s): LACTATE in the last 168 hours.     []  Hepatic - Are the patient's liver enzymes elevated (ALT greater than 72 IU/L or Total Bilirubin greater than 2 MG/dL)?   Recent Labs  Lab 07/08/14  0232   BILIRUBIN, TOTAL 0.9   ALT 11         []  CNS - Does the patient have altered consciousness or reduced Glasgow Coma Scale?     Other Active Diagnoses that may be contributing to signs of end organ dysfunction (Ex. Chronic kidney disease, cirrhosis):   - ________________________________________________________________      C.  Did you check any of the boxes above?     []  No, Sepsis Screen Negative   []  YES:  A) Infection + B) SIRS + C) Acute Organ Dysfunction = Positive Screen for Severe Sepsis. Notify attending (house officer during off-hours).     Notify Attending/House Technical sales engineer  and document in Complex Assessment under provider notification   - Name of physician notified:                                           - Date/Time Notifiied:                                             Document actions:   _0  Lactate drawn   _1  Blood Cultures obtained   _2  Antibiotics initiated or modified   _3   IV Fluid administered 0.9% NS __________ mLs given   Nursing Comments/Narrative:    - ______________________________________________________________     The Surviving Sepsis Guidelines recommend the following interventions to be  completed within one hour of a positive sepsis screen.   Obtain new blood cultures prior to antibiotic administration if not done within the last 24 hours.   Obtain lactate level, if initial lactate > 6mol, repeat lactate in 2 hours for goal decrease 10-20%; If there is not a decrease call [facility], hospital  If SBP < 90 or MAP < 65 or lactate greater than 4 mmol/dl; Start 0.9% NS IV Fluid Bolus of 30 mL/Kg (minimum) to maintain MAP > 65    Initiate vasopressors for hypotension not responding to fluid resuscitation (1st line Norepinephrine 1 - 300 mcg/min IV) - Patient must be in CCU if requires vasopressor   Initiate or escalate antibiotic therapy   Goal urine output greater than/equal to 0.5 mL/kg/hr

## 2014-07-09 NOTE — Progress Notes (Signed)
Changed dressing and checked right knee incision. Patient has persistent bloody drainage (approx 3-4" circ on dressing) and now has 1.5 cm area of incision dehiscence.     Discussed with Dr Ahmed Prima who would like to take patient to OR tomorrow with Dr Campbell Lerner for right knee I+D and revision soft tissue closure.     Will discuss with medicine. Will order 1u PRBC to be transfused now and 1u on hold for OR tomorrow. NPO after midnight.

## 2014-07-09 NOTE — PT Progress Note (Signed)
Physical Therapy Note    Pt in surgery for endoscopy and unavailable for PT at 2:30pm. Will reschedule pt.

## 2014-07-09 NOTE — Progress Notes (Signed)
S/P EGD, pt returned to unit stable.Denied pain upon return Diet advanced to regular and tolerated well. Dressing change to right knee incision site done by orthopedics. Pt to OR tomorrow for revision of right knee surgical site. Pt aware.

## 2014-07-09 NOTE — PT Eval Note (Signed)
[facility]  [address]  [place] [address]  [phone]    Physical Therapy Evaluation    Patient: [name] MRN: [id number]   Unit: CCU CRITICAL CARE Bed: I696/E952-84    Time of Treatment: Time Calculation  PT Received On: 07/09/14  Start Time: 0800  Stop Time: 0845  Time Calculation (min): 45 min    Consult received for [name] for PT evaluation and treatment.  Patient's medical condition is appropriate for Physical Therapy  intervention at this time.    Precautions  Weight Bearing Status: RLE partial weight bearing  Weight Bearing Percent: 50  Total Knee Replacement: hinge brace (locked in ext)  Precaution Instructions Given to Patient: Yes    History of Present Illness: [name] is a 78 y.o. female admitted on 07/08/2014   Medical Diagnosis: Acute GI bleeding [578.9 (ICD-9-CM)]      Patient Active Problem List   Diagnosis   . Total knee replacement status   . Low back pain   . Chronic obstructive pulmonary disease   . Hypertension   . Debility following multiple R knee revisions   . Non-healing surgical wound- right knee    . Fever   . Leukocytosis   . Anemia   . Hypokalemia   . Chronic infection of prosthetic knee   . COPD (chronic obstructive pulmonary disease)   . HTN (hypertension)   . Infection   . Osteoarthritis   . GI bleed     Past Medical History   Diagnosis Date   . Hypertension    . Pneumonia    . Chronic obstructive pulmonary disease    . Headache(784.0)      not since surgery in 1957   . Arthritis    . Difficulty in walking(719.7)    . Low back pain    . Urinary tract infection    . Depression      Past Surgical History   Procedure Laterality Date   . Cervical spine surgery     . Lumbar spine surgery  posterior w/ hardware   . Tonsillectomy     . Brain surgery  R side/ 1957 Optic nerve pressure   . Eye surgery       cataracts   . Colonoscopy     . Tubal ligation  1961   . Arthroplasty, knee, total  08/25/2013     Procedure: ARTHROPLASTY, KNEE, TOTAL;  Surgeon:  Lane Hacker, MD;  Location: [facility];  Service: Orthopedics;  Laterality: Right;   . Arthroplasty, knee, revision total  09/05/2013     Procedure: ARTHROPLASTY, KNEE, REVISION TOTAL;  Surgeon: Lane Hacker, MD;  Location: [facility];  Service: Orthopedics;  Laterality: Right;  RT KNEE WASHOUT & POLY EXCHANGE **POA/CH** (STRYKER)    . Arthroplasty, knee, resection  09/15/2013     Procedure: ARTHROPLASTY, KNEE, RESECTION;  Surgeon: Lane Hacker, MD;  Location: [facility];  Service: Orthopedics;  Laterality: Right;  AND PLACEMENT OF ANTIBIOTIC SPACER  Placement of wound vac   . Arthroplasty, knee, revision total  09/22/2013     Procedure: ARTHROPLASTY, KNEE, REVISION TOTAL;  Surgeon: Lane Hacker, MD;  Location: [facility];  Service: Orthopedics;  Laterality: Right;   . Debridement, muscle flap closure  09/22/2013     Procedure: DEBRIDEMENT, MUSCLE FLAP CLOSURE;  Surgeon: Ernie Avena, MD;  Location: [facility];  Service: Plastics;  Laterality: Right;  DEBRIDEMENT &  MUSCLE FLAP KNEE RT   . Graft, skin  split thickness, (medical)  09/22/2013     Procedure: GRAFT, SKIN  SPLIT THICKNESS, (MEDICAL);  Surgeon: Ernie Avena, MD;  Location: [facility];  Service: Plastics;  Laterality: N/A;   . Echocardiogram, transesophageal N/A 03/26/2014     Procedure: ECHOCARDIOGRAM, TRANSESOPHAGEAL;  Surgeon: Marni Griffon, MD;  Location: [facility];  Service: Cardiology;  Laterality: N/A;   . Arthroplasty, knee, revision total Right 07/01/2014     Procedure: ARTHROPLASTY, KNEE, REVISION TOTAL;  Surgeon: Lane Hacker, MD;  Location: [facility];  Service: Orthopedics;  Laterality: Right;     Social History:  Prior Level of Function  Prior level of function: Ambulates with assistive device;Needs assistance with ADLs  Assistive Device: Front wheel walker;Wheelchair  Baseline Activity Level: Household ambulation  DME Currently at Home: Front wheel walker;Single point cane;3-in-1  [facility]    Home Living Arrangements  Living Arrangements: Children  Type of Home: House  Home Layout: Two level;Stairs to enter with rails (add number in comment) (3)  Bathroom Shower/Tub: Walk-in shower (on second floor)  Bathroom Toilet: Standard  Bathroom Accessibility: Accessible (rolls w/c to door, amb to toilet)  DME Currently at Home: Front wheel walker;Single point cane;3-in-1 [facility]    ASSESSMENT     [name] is a 78 y.o. female admitted 07/08/2014. Pt presents with  Right Knee  . Patient present with deficits in strength, function, ambulation and ADLS.  Pt would benefit from skilled Physical Therapy Services.  Assessment  Assessment: Decreased LE ROM;Decreased LE strength;Decreased endurance/activity tolerance;Gait impairment;Decreased balance;Decreased functional mobility  Prognosis: Good;With continued PT status post acute discharge  Progress: Progressing toward goals     Goals  Goal Formulation: With patient  Time for Goal Acheivement: 7 visits  Goals: Select goal  Pt Will Go Supine To Sit: with supervision, to maximize functional mobility and independence  Pt Will Perform Sit to Stand: with supervision, to maximize functional mobility and independence  Pt Will Transfer Bed/Chair: with supervision, to maximize functional mobility and independence  Pt Will Transfer to Toilet: with rolling walker, with supervision, to maximize functional mobility and independence  Pt Will Transfer to Tub/Shower: with rolling walker, with supervision, to maximize functional mobility and independence  PT Will Advertising account executive Transfer Technique: with rolling walker, with supervision, to maximize functional mobility and independence  Pt Will Ambulate: 151-200 feet, with rolling walker, with stand by assist, to maximize functional mobility and independence  Pt Will Go Up / Down Stairs: 3-5 stairs, with contact guard assist, to maximize functional mobility and independence  Pt Will Perform Home Exer Program: with minimal assist, to  maximize functional mobility and independence  Pt Will Perform LE Dressing w/Device: with minimal assist, With adaptive equipment, to maximize functional mobility and independence    Rehabilitation Potential  Prognosis: Good;With continued PT status post acute discharge    PLAN     Plan  Risks/Benefits/POC Discussed with Pt/Family: With patient  Patient Goal: go home  Treatment/Interventions: Exercise;Gait training;Functional transfer training;LE strengthening/ROM;Endurance training;Bed mobility  PT Frequency: 7x/wk    Recommendation  Discharge Recommendation: Home with supervision;Home with home health PT  PT - Next Visit Recommendation: 07/10/14  PT Frequency: 7x/wk    SUBJECTIVE      Patient is agreeable to participation in the therapy session. Nursing clears patient for therapy.  Patient Goal  Patient Goal: go home    Pain Assessment  Pain Assessment: No/denies pain  OBJECTIVE     Patient is in bed with IV, dressings, telemetry, SCDs and IROM.    Observation of patient:     Cognition  Arousal/Alertness: Appropriate responses to stimuli  Attention Span: Appears intact  Orientation Level: Oriented X4  Memory: Appears intact  Following Commands: Follows all commands and directions without difficulty  Safety Awareness: minimal verbal instruction     Vitals: HR, O2 stable w/ activity    Musculoskeletal Examination  Gross ROM  Right Lower Extremity ROM: unable to assess  Left Lower Extremity ROM: within functional limits    Sensation is intact     Gross Strength  Right Lower Extremity Strength: unable to assess  Left Lower Extremity Strength: within functional limits    Functional Mobility  Rolling: Contact Guard Assist  Supine to Sit: Contact Guard Assist  Scooting to EOB: Contact Guard Assist  Sit to Stand: Minimal Assist  Stand to Sit: Minimal Assist    Transfers  Bed to Chair: Minimal Assist     Locomotion  Ambulation: Minimal Assist;with front-wheeled walker  Ambulation Distance (Feet): 5 Feet  Pattern:  decreased step length;decreased cadence  Weight Bearing Observed: PWB;Able to maintain weight bearing precautions    Pt did not perform self care activities at this time.    Therapeutic Exercises:  Ankle Pumps:  10    Participation and Endurance  Participation Effort: good  Endurance: Tolerates 30 min exercise with multiple rests    Educated the patient to role of physical therapy, plan of care, goals  of therapy and Physical Therapy  Precautions  Adaptive Equipment  Weight Bearing  Gait Techniques  Transfer Techniques  Leg Exercises.    Patient continues to need focused education on Physical Therapy  Precautions  Adaptive Equipment  Weight Bearing  Gait Techniques  Transfer Techniques  Leg Exercises.    Patient is seated in a bedside chair  with all needs provided and call bell within reach. RN notified of session outcome.    Signature: Prince Solian, PT

## 2014-07-09 NOTE — Progress Notes (Signed)
PROGRESS NOTE    Date Time: 07/09/2014 6:57 AM  Patient Name: [name], [name]      Assessment:   - s/p R TKA reimplantation on 07/01/14, discharged 07/06/14 on po Augment 875mg  BID and Aspirin 325mg  po daily x4 weeks.   -admitted to medicine for r/o GI bleed v gastritis w/ dark stools secondary to Fe tablets  -stool occult blood positive 10/1    Plan:   Continue PT - 50% WB RLE in HKB locked in extension    Continue anticoagulation - foot pumps ordered 9/30 but not on patient this am (have discussed with nursing need for SCDs especially since ASA being held), medicine holding ASA pending GI workup, restart ASA as soon as possible    GI workup: appreciate medical management, stool occult blood positive 10/1    Nutrition support: please order nutrition support (boost, juven, MVI, vit C) when regular diet ordered    High risk surgical wound: placed steri strips on one small spot at incision with bloody drainage 9/30, dressing change 10/1 had persistent bloody non-purulent drainage at distal incision, re-placed compressive dressing, will check this evening    D/c planning - home when medically clear, home health nursing, continue po Augmentin per ID, f/u ortho 2weeks post op, f/u ID 3-4 weeks post op    Subjective:   Pain controlled, tolerating diet, denies nausea/vomiting/diarrhea    Medications:     Current Facility-Administered Medications   Medication Dose Route Frequency   . amLODIPine  5 mg Oral Daily   . bisoprolol  5 mg Oral Daily   . cloNIDine  0.1 mg Oral Daily   . gabapentin  300 mg Oral Daily   . piperacillin-tazobactam  4.5 g Intravenous Q8H SCH   . tiotropium  18 mcg Inhalation QAM       Physical Exam:     Filed Vitals:    07/09/14 0610   BP: 164/74   Pulse: 74   Temp: 98.6 F (37 C)   Resp: 11   SpO2: 90%       Intake and Output Summary (Last 24 hours) at Date Time    Intake/Output Summary (Last 24 hours) at 07/09/14 0657  Last data filed at 07/09/14 0340   Gross per 24 hour   Intake      0 ml   Output      8  ml   Net     -8 ml       General appearance - no acute distress  Musculoskeletal -  Distal aspect of incision is intact without dehiscence or breakdown, one small spot between sutures has some bloody non-purulent drainage, distal abd pad has bloody drainage, no erythema or fluctuance  Fires quad/TA/EHL/GSC  SILT SP/DP/TN  WWP distally  Calves soft, nontender bilateral    Labs:     Results     Procedure Component Value Units Date/Time    Stool Occult Blood [[id number]]  (Abnormal) Collected:  07/09/14 0500    Specimen Information:  Stool Updated:  07/09/14 0527     Stool Occult Blood Positive (A)     Urine culture [[id number]] Collected:  07/08/14 1640    Specimen Information:  Urine / Urine, Clean Catch Updated:  07/08/14 2020    MRSA culture [[id number]] Collected:  07/08/14 0516    Specimen Information:  Body Fluid / Nares and Throat Updated:  07/08/14 1050    CBC and differential [[id number]]  (Abnormal) Collected:  07/08/14 0949    Specimen Information:  Blood / Blood Updated:  07/08/14 1019     WBC 10.56 x10 3/uL      RBC 3.06 ([name]) x10 6/uL      Hgb 8.0 ([name]) g/dL      Hematocrit 57.8 ([name]) %      MCV 81.7 fL      MCH 26.1 ([name]) pg      MCHC 32.0 g/dL      RDW 15 %      Platelets 208 x10 3/uL      MPV 10.7 fL      Neutrophils 69 %      Lymphocytes Automated 17 %      Monocytes 11 %      Eosinophils Automated 2 %      Basophils Automated 0 %      Immature Granulocyte 1 %      Nucleated RBC 0 /100 WBC      Neutrophils Absolute 7.33 x10 3/uL      Abs Lymph Automated 1.84 x10 3/uL      Abs Mono Automated 1.19 x10 3/uL      Abs Eos Automated 0.19 x10 3/uL      Absolute Baso Automated 0.01 x10 3/uL      Absolute Immature Granulocyte 0.10 (H) x10 3/uL           Physical Therapy:   Pain Score: 3-mild pain (07/09/14 0016)              Rads:   Radiological Procedure reviewed.    Signed by: Collene Schlichter III

## 2014-07-09 NOTE — Anesthesia Postprocedure Evaluation (Signed)
Anesthesia Post Evaluation    Patient: [name]    Procedures performed: Procedure(s):  EGD    Anesthesia type: General Tiva    Patient location:Phase II PACU    Last vitals:   Filed Vitals:    07/09/14 1615   BP: 188/79   Pulse: 75   Temp:    Resp: 17   SpO2: 95%       Post pain: Patient not complaining of pain, continue current therapy     Mental Status:awake    Respiratory Function: tolerating room air    Cardiovascular: stable    Nausea/Vomiting: patient not complaining of nausea or vomiting    Hydration Status: adequate    Post assessment: no apparent anesthetic complications and no reportable events

## 2014-07-10 ENCOUNTER — Inpatient Hospital Stay: Payer: No Typology Code available for payment source | Admitting: Anesthesiology

## 2014-07-10 ENCOUNTER — Encounter: Payer: Self-pay | Admitting: Gastroenterology

## 2014-07-10 ENCOUNTER — Encounter
Admission: EM | Disposition: A | Discharge status: Home Health | Payer: Self-pay | Source: Home / Self Care | Attending: Internal Medicine

## 2014-07-10 HISTORY — PX: INCISION & DRAINAGE, WOUND, CLOSURE: SHX4270

## 2014-07-10 HISTORY — PX: CLOSURE, WOUND DEHISCENCE: SHX3460

## 2014-07-10 LAB — BASIC METABOLIC PANEL
BUN: 5 mg/dL — ABNORMAL LOW (ref 7–19)
CO2: 26 mEq/L (ref 22–29)
Calcium: 8.1 mg/dL (ref 7.9–10.2)
Chloride: 108 mEq/L (ref 100–111)
Creatinine: 0.7 mg/dL (ref 0.6–1.0)
Glucose: 94 mg/dL (ref 70–100)
Potassium: 2.9 mEq/L — ABNORMAL LOW (ref 3.5–5.1)
Sodium: 145 mEq/L (ref 136–145)

## 2014-07-10 LAB — ECG 12-LEAD
Atrial Rate: 84 {beats}/min
P Axis: 27 degrees
P-R Interval: 96 ms
Q-T Interval: 394 ms
QRS Duration: 86 ms
QTC Calculation (Bezet): 465 ms
R Axis: 44 degrees
T Axis: 56 degrees
Ventricular Rate: 84 {beats}/min

## 2014-07-10 LAB — PT/INR
PT INR: 1.2 — ABNORMAL HIGH (ref 0.9–1.1)
PT: 15.3 s — ABNORMAL HIGH (ref 12.6–15.0)

## 2014-07-10 LAB — CBC
Hematocrit: 27.8 % — ABNORMAL LOW (ref 37.0–47.0)
Hgb: 9 g/dL — ABNORMAL LOW (ref 12.0–16.0)
MCH: 26.5 pg — ABNORMAL LOW (ref 28.0–32.0)
MCHC: 32.4 g/dL (ref 32.0–36.0)
MCV: 81.8 fL (ref 80.0–100.0)
MPV: 10.3 fL (ref 9.4–12.3)
Nucleated RBC: 0 /100 WBC (ref 0–1)
Platelets: 219 10*3/uL (ref 140–400)
RBC: 3.4 10*6/uL — ABNORMAL LOW (ref 4.20–5.40)
RDW: 14 % (ref 12–15)
WBC: 9.84 10*3/uL (ref 3.50–10.80)

## 2014-07-10 LAB — POTASSIUM: Potassium: 3.1 mEq/L — ABNORMAL LOW (ref 3.5–5.1)

## 2014-07-10 LAB — GFR: EGFR: >60

## 2014-07-10 SURGERY — CLOSURE, WOUND DEHISCENCE
Anesthesia: Regional | Site: Knee | Wound class: Contaminated

## 2014-07-10 MED ORDER — SODIUM CHLORIDE 0.9 % IV SOLN
INTRAVENOUS | Status: AC
Start: 2014-07-10 — End: ?
  Filled 2014-07-10: qty 250

## 2014-07-10 MED ORDER — FENTANYL CITRATE 0.05 MG/ML IJ SOLN
50.0000 ug | INTRAMUSCULAR | Status: DC | PRN
Start: 2014-07-10 — End: 2014-07-10

## 2014-07-10 MED ORDER — BACITRACIN 50000 UNITS IM SOLR
INTRAMUSCULAR | Status: AC
Start: 2014-07-10 — End: ?
  Filled 2014-07-10: qty 100000

## 2014-07-10 MED ORDER — VANCOMYCIN HCL 1000 MG IV SOLR
1000.0000 mg | INTRAVENOUS | Status: AC
Start: 2014-07-10 — End: 2014-07-10
  Administered 2014-07-10: 1000 mg via INTRAVENOUS
  Filled 2014-07-10 (×2): qty 1000

## 2014-07-10 MED ORDER — BACITRACIN 50000 UNITS IM SOLR
INTRAMUSCULAR | Status: DC | PRN
Start: 2014-07-10 — End: 2014-07-10
  Administered 2014-07-10: 100000 [IU]

## 2014-07-10 MED ORDER — LACTATED RINGERS IV SOLN
INTRAVENOUS | Status: DC | PRN
Start: 2014-07-10 — End: 2014-07-10

## 2014-07-10 MED ORDER — SODIUM CHLORIDE 0.9 % IV MBP
1.0000 g | INTRAVENOUS | Status: AC
Start: 2014-07-10 — End: 2014-07-10
  Administered 2014-07-10: 1 g via INTRAVENOUS
  Filled 2014-07-10 (×2): qty 1000

## 2014-07-10 MED ORDER — AMLODIPINE BESYLATE 5 MG PO TABS
10.0000 mg | ORAL_TABLET | Freq: Every day | ORAL | Status: DC
Start: 2014-07-10 — End: 2014-07-13
  Administered 2014-07-10 (×2): 5 mg via ORAL
  Administered 2014-07-11 – 2014-07-13 (×3): 10 mg via ORAL
  Filled 2014-07-10 (×3): qty 2
  Filled 2014-07-10: qty 1

## 2014-07-10 MED ORDER — FENTANYL CITRATE 0.05 MG/ML IJ SOLN
INTRAMUSCULAR | Status: DC | PRN
Start: 2014-07-10 — End: 2014-07-10
  Administered 2014-07-10: 50 ug via INTRATHECAL
  Administered 2014-07-10 (×2): 25 ug via INTRAVENOUS

## 2014-07-10 MED ORDER — DIPHENHYDRAMINE HCL 50 MG/ML IJ SOLN
6.2500 mg | Freq: Four times a day (QID) | INTRAMUSCULAR | Status: DC | PRN
Start: 2014-07-10 — End: 2014-07-10

## 2014-07-10 MED ORDER — MEPERIDINE HCL 25 MG/ML IJ SOLN
25.0000 mg | INTRAMUSCULAR | Status: DC | PRN
Start: 2014-07-10 — End: 2014-07-10

## 2014-07-10 MED ORDER — POTASSIUM CHLORIDE 10 MEQ/100ML IV SOLN
10.0000 meq | INTRAVENOUS | Status: AC
Start: 2014-07-10 — End: 2014-07-10
  Administered 2014-07-10 (×2): 10 meq via INTRAVENOUS
  Filled 2014-07-10 (×2): qty 100

## 2014-07-10 MED ORDER — MIDAZOLAM HCL 5 MG/5ML IJ SOLN
INTRAMUSCULAR | Status: AC
Start: 2014-07-10 — End: ?
  Filled 2014-07-10: qty 5

## 2014-07-10 MED ORDER — METOCLOPRAMIDE HCL 5 MG/ML IJ SOLN
INTRAMUSCULAR | Status: DC | PRN
Start: 2014-07-10 — End: 2014-07-10

## 2014-07-10 MED ORDER — MIDAZOLAM HCL 5 MG/ML IJ SOLN
INTRAMUSCULAR | Status: DC | PRN
Start: 2014-07-10 — End: 2014-07-10
  Administered 2014-07-10: 5 mg via INTRAVENOUS

## 2014-07-10 MED ORDER — BUPIVACAINE HCL (PF) 0.5 % IJ SOLN
INTRAMUSCULAR | Status: DC | PRN
Start: 2014-07-10 — End: 2014-07-10
  Administered 2014-07-10: 2.5 mg via EPIDURAL
  Administered 2014-07-10: 15 mg via INTRATHECAL
  Administered 2014-07-10: 5 mg via INTRATHECAL

## 2014-07-10 MED ORDER — PROPOFOL INFUSION 10 MG/ML
INTRAVENOUS | Status: DC | PRN
Start: 2014-07-10 — End: 2014-07-10
  Administered 2014-07-10: 50 ug/kg/min via INTRAVENOUS

## 2014-07-10 MED ORDER — LIDOCAINE HCL (PF) 2 % IJ SOLN
INTRAMUSCULAR | Status: AC
Start: 2014-07-10 — End: ?
  Filled 2014-07-10: qty 5

## 2014-07-10 MED ORDER — KETAMINE HCL 50 MG/ML IJ SOLN
INTRAMUSCULAR | Status: AC
Start: 2014-07-10 — End: ?
  Filled 2014-07-10: qty 10

## 2014-07-10 MED ORDER — HYDROCODONE-ACETAMINOPHEN 5-325 MG PO TABS
1.0000 | ORAL_TABLET | Freq: Once | ORAL | Status: DC | PRN
Start: 2014-07-10 — End: 2014-07-10

## 2014-07-10 MED ORDER — METOCLOPRAMIDE HCL 5 MG/ML IJ SOLN
INTRAMUSCULAR | Status: DC | PRN
Start: 2014-07-10 — End: 2014-07-10
  Administered 2014-07-10: 20 mg via INTRAVENOUS

## 2014-07-10 MED ORDER — POTASSIUM CHLORIDE CRYS ER 20 MEQ PO TBCR
20.0000 meq | EXTENDED_RELEASE_TABLET | Freq: Two times a day (BID) | ORAL | Status: DC
Start: 2014-07-11 — End: 2014-07-13
  Administered 2014-07-11 – 2014-07-13 (×5): 20 meq via ORAL
  Filled 2014-07-10 (×6): qty 1

## 2014-07-10 MED ORDER — VANCOMYCIN HCL 1000 MG IV SOLR
1000.0000 mg | Freq: Two times a day (BID) | INTRAVENOUS | Status: DC
Start: 2014-07-10 — End: 2014-07-10

## 2014-07-10 MED ORDER — PROMETHAZINE HCL 25 MG/ML IJ SOLN
6.2500 mg | Freq: Once | INTRAMUSCULAR | Status: DC | PRN
Start: 2014-07-10 — End: 2014-07-10

## 2014-07-10 MED ORDER — FENTANYL CITRATE 0.05 MG/ML IJ SOLN
INTRAMUSCULAR | Status: AC
Start: 2014-07-10 — End: ?
  Filled 2014-07-10: qty 2

## 2014-07-10 MED ORDER — LIDOCAINE-EPINEPHRINE 2 %-1:200000 IJ SOLN
INTRAMUSCULAR | Status: AC
Start: 2014-07-10 — End: ?
  Filled 2014-07-10: qty 20

## 2014-07-10 MED ORDER — ACETAMINOPHEN 325 MG PO TABS
ORAL_TABLET | ORAL | Status: AC
Start: 2014-07-10 — End: 2014-07-11
  Filled 2014-07-10: qty 2

## 2014-07-10 MED ORDER — PROPOFOL 10 MG/ML IV EMUL
INTRAVENOUS | Status: AC
Start: 2014-07-10 — End: ?
  Filled 2014-07-10: qty 50

## 2014-07-10 MED ORDER — VANCOMYCIN HCL 1000 MG IV SOLR
1250.0000 mg | Freq: Two times a day (BID) | INTRAVENOUS | Status: DC
Start: 2014-07-10 — End: 2014-07-11
  Administered 2014-07-11: 1250 mg via INTRAVENOUS
  Filled 2014-07-10 (×3): qty 1250

## 2014-07-10 MED ORDER — TRANEXAMIC ACID 100 MG/ML IV SOLN
1000.0000 mg | INTRAVENOUS | Status: AC
Start: 2014-07-10 — End: 2014-07-10
  Filled 2014-07-10: qty 10

## 2014-07-10 MED ORDER — FAMOTIDINE 10 MG/ML IV SOLN (WRAP)
INTRAVENOUS | Status: DC | PRN
Start: 2014-07-10 — End: 2014-07-10
  Administered 2014-07-10: 20 mg via INTRAVENOUS

## 2014-07-10 MED ORDER — ONDANSETRON HCL 4 MG/2ML IJ SOLN
INTRAMUSCULAR | Status: DC | PRN
Start: 2014-07-10 — End: 2014-07-10
  Administered 2014-07-10: 4 mg via INTRAVENOUS

## 2014-07-10 MED ORDER — TAB-A-VITE/BETA CAROTENE PO TABS
1.0000 | ORAL_TABLET | Freq: Every day | ORAL | Status: DC
Start: 2014-07-10 — End: 2014-07-13
  Administered 2014-07-11 – 2014-07-13 (×3): 1 via ORAL
  Filled 2014-07-10 (×4): qty 1

## 2014-07-10 MED ORDER — ONDANSETRON HCL 4 MG/2ML IJ SOLN
INTRAMUSCULAR | Status: AC
Start: 2014-07-10 — End: ?
  Filled 2014-07-10: qty 2

## 2014-07-10 MED ORDER — ACETAMINOPHEN 325 MG PO TABS
650.0000 mg | ORAL_TABLET | ORAL | Status: DC
Start: 2014-07-10 — End: 2014-07-10
  Administered 2014-07-10: 650 mg via ORAL

## 2014-07-10 MED ORDER — VITAMIN C 500 MG PO TABS
500.0000 mg | ORAL_TABLET | Freq: Two times a day (BID) | ORAL | Status: DC
Start: 2014-07-10 — End: 2014-07-13
  Administered 2014-07-10 – 2014-07-13 (×6): 500 mg via ORAL
  Filled 2014-07-10 (×7): qty 1

## 2014-07-10 MED ORDER — HYDROMORPHONE HCL 1 MG/ML IJ SOLN
0.5000 mg | INTRAMUSCULAR | Status: DC | PRN
Start: 2014-07-10 — End: 2014-07-10

## 2014-07-10 MED ORDER — FAMOTIDINE 20 MG/2ML IV SOLN
INTRAVENOUS | Status: AC
Start: 2014-07-10 — End: ?
  Filled 2014-07-10: qty 2

## 2014-07-10 MED ORDER — POTASSIUM CHLORIDE CRYS ER 20 MEQ PO TBCR
20.0000 meq | EXTENDED_RELEASE_TABLET | Freq: Once | ORAL | Status: AC
Start: 2014-07-10 — End: 2014-07-10
  Administered 2014-07-10: 20 meq via ORAL
  Filled 2014-07-10: qty 1

## 2014-07-10 MED ORDER — VANCOMYCIN HCL 1000 MG IV SOLR
INTRAVENOUS | Status: AC
Start: 2014-07-10 — End: ?
  Filled 2014-07-10: qty 1000

## 2014-07-10 MED ORDER — METOCLOPRAMIDE HCL 5 MG/ML IJ SOLN
INTRAMUSCULAR | Status: AC
Start: 2014-07-10 — End: ?
  Filled 2014-07-10: qty 4

## 2014-07-10 SURGICAL SUPPLY — 42 items
BANDAGE CMPR VLCR PLSTR CTTN MED MTRX 15 (Bandage) ×1
BANDAGE GZE CTTN MED KRLX 3.6YDX6.4IN LF (Dressing) ×2
BANDAGE KERLIX MEDIUM GAUZE L3.6 YD X (Dressing) ×2 IMPLANT
BANDAGE MEDIUM COMPRESSION L15 YD X W6 IN ELASTIC VELCRO POLYESTER (Bandage) ×1 IMPLANT
BANDAGE MEDLINE MEDIUM COMPRESSION L15 (Bandage) ×1
DRESSING XEROFORM 5X9 (Dressing) ×2 IMPLANT
GLOVE BIOGEL SURG PF SZ 8.5 (Glove) ×2 IMPLANT
GLOVE ECLIPSE BIOGEL SZ 7.5 (Glove) ×2 IMPLANT
GLOVE SRG PLISPRN 8 BGL PI INDCTR (Glove) ×1
GLOVE SURGICAL 8 BIOGEL PI INDICATOR (Glove) ×1
GLOVE SURGICAL 8 BIOGEL PI INDICATOR UNDERGLOVE POWDER FREE SMOOTH (Glove) ×1 IMPLANT
GOWN SRG POLY 2XL ASTND LF STRL LVL 4 (Gown) ×1
GOWN SRG XL SMARTGOWN LF STRL LVL 4 (Gown) ×1
GOWN SURGICAL 2XL POLY ASTOUND BLUE (Gown) ×1
GOWN SURGICAL 2XL POLY ASTOUND BLUE LEVEL 4 IMPERVIOUS REINFORCE SET (Gown) ×1 IMPLANT
GOWN SURGICAL XL SMARTGOWN LEVEL 4 (Gown) ×1
GOWN SURGICAL XL SMARTGOWN LEVEL 4 BREATHABLE (Gown) ×1 IMPLANT
MARKER SKIN (Positioning Supplies) ×2 IMPLANT
PADDING CAST L4 YD X W6 IN UNDERCAST (Cast) ×1
PADDING CAST L4 YD X W6 IN UNDERCAST MILD STRETCH COHESIVE WEBRIL (Cast) ×1 IMPLANT
PADDING CST CTTN WBRL 4YDX6IN LF STRL (Cast) ×1
SOLUTION IRR 0.9% NACL 3L URMTC LF PLS (Irrigation Solutions) ×2
SOLUTION IRRIGATION 0.9% SODIUM CHLORIDE 3000 ML PLASTIC CONTAINER (Irrigation Solutions) ×1 IMPLANT
SOLUTION IV 0.9% NACL 1000ML VFLX LF PLS (IV Solutions) ×1
SOLUTION IV 0.9% SODIUM CHLORIDE PVC (IV Solutions) ×1
SOLUTION IV 0.9% SODIUM CHLORIDE PVC 1000 ML PH 5 PLASTIC CONTAINER (IV Solutions) ×1 IMPLANT
STRAP CATH LEG HLDR ADLT (Urology Supply) ×2 IMPLANT
SUTURE ABS 0 CT PDS2 36IN MFL VIOL (Suture) ×3
SUTURE ABS 2-0 CT1 PDS2 27IN MFL UD (Suture) ×3
SUTURE PDS II 0 CT L36 IN MONOFILAMENT (Suture) ×3
SUTURE PDS II 0 CT L36 IN MONOFILAMENT VIOLET ABSORBABLE (Suture) ×3 IMPLANT
SUTURE PDS II 2-0 CT-1 L27 IN (Suture) ×3
SUTURE PDS II 2-0 CT-1 L27 IN MONOFILAMENT UNDYED ABSORBABLE (Suture) ×3 IMPLANT
SUTURE PLAIN 5-0 P3 18IN (Suture) ×2 IMPLANT
TOURNIQUET 34X4 PURPLE (Procedure Accessories) ×2 IMPLANT
TRAY CATH FOLEY SILICONE 16FR (Catheter Urine) ×2 IMPLANT
TRAY SKIN BETANDINE PREP (Tray) ×2 IMPLANT
TRAY TOTAL KNEE PACK (Tray) ×2 IMPLANT
UNDRGLOV SZ8.5 PI INDICATR BLU (Glove) ×2 IMPLANT
WATER STERILE PLASTIC POUR BOTTLE 1000 (Irrigation Solutions) ×2
WATER STERILE PLASTIC POUR BOTTLE 1000 ML (Irrigation Solutions) ×2 IMPLANT
WATER STRL 1000ML LF PLS PR BTL (Irrigation Solutions) ×2

## 2014-07-10 NOTE — Progress Notes (Signed)
INFECTIOUS DISEASES PROGRESS NOTE    Date Time: 07/10/2014 8:02 PM  Patient Name: [name],[name]      Assessment and Recommendations:     Patient Active Problem List   Diagnosis   . Total knee replacement status   . Low back pain   . Chronic obstructive pulmonary disease   . Hypertension   . Debility following multiple R knee revisions   . Non-healing surgical wound- right knee    . Fever   . Leukocytosis   . Anemia   . Hypokalemia   . Chronic infection of prosthetic knee   . COPD (chronic obstructive pulmonary disease)   . HTN (hypertension)   . Infection   . Osteoarthritis   . GI bleed     78 yo female admitted with melena.  egd 10/1  With gastritis, superficial ulceration.    H/h has been stable, and no signs of active  Bleeding.     HX COMPLICATED RIGHT KNEE  PJI and soft tissue infection   - proteus pji initially  -group c strep/esbl soft tissue infection associated with gastro flap   -gbs bacteremia 6/15 - source unclear - tx'd with iv thru 04/10/14    S/p reimplant right knee on 07/01/14.  OR cxs   Negative.  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 Difficile.  - maintain vigilance     vre isolated from urine -  Asymptomatic colonization     Will opt to cover with daptomycin (instead of vanco) and zosyn  In perioperative period.        Subjective:   Resting comfortably    Antibiotics:   Zosyn  periop  vanco  periop  --->change to  daptomycin to cover mrsa/and vre     Medications:     Current Facility-Administered Medications   Medication Dose Route Frequency   . amLODIPine  10 mg Oral Daily   . bisoprolol  5 mg Oral Daily   . chlorhexidine  10 mL Mouth/Throat BID   . cloNIDine  0.1 mg Oral Daily   . gabapentin  300 mg Oral Daily   . multivitamin  1  tablet Oral Daily   . mupirocin   Nasal BID   . pantoprazole  40 mg Oral QAM AC   . piperacillin-tazobactam  4.5 g Intravenous Q8H SCH   . [START ON 07/11/2014] potassium chloride  20 mEq Oral BID   . sucralfate  1 g Oral TID AC & HS   . tiotropium  18 mcg Inhalation QAM   . vancomycin  1,250 mg Intravenous Q12H SCH   . vitamin C  500 mg Oral BID       Central Access/Endovascular Hardware:     Active PICC Line / CVC Line / PIV Line / Drain / Airway / Intraosseous Line / Epidural Line / ART Line / Line / Wound / Pressure Ulcer / NG/OG Tube     Name:   Placement date:   Placement time:   Site:   Days:    PICC Single Lumen 03/24/14 Right Basilic  03/24/14   1518   Basilic   107    Peripheral IV 07/08/14 Right Antecubital  07/08/14   0243   Antecubital   1          Review of Systems:   Patient is without fevers, chills, nausea, vomiting, diarrhea, abdominal pain, cough, dyspnea, headache, rash.      Physical Exam:     Filed Vitals:    07/10/14 1630 07/10/14 1700 07/10/14 1740 07/10/14 1755   BP: 162/71 151/69 152/66 147/82   Pulse: 63 62 72 66   Temp:  98.6 F (37 C)     TempSrc:  Tympanic     Resp: 18 18  18    Height:       Weight:       SpO2: 99% 100%         Temp (24hrs), Avg:98.6 F (37 C), Min:98.4 F (36.9 C), Max:98.9 F (37.2 C)        In no apparent distress  Sclera anicteric, conjunctivae clear  Oral pharynx s erythema, exudate, or other significant lesions  No Thrush  Neck supple, s lymphadenopathy  Lungs Clear, bibas rales   Heart Regular rate and rhythm s murmurs, rubs, or gallops  Abdomen Soft, non tender, non distended, normal bowel sounds  Back No Costovertebral angle tenderness, no point tenderness along the spine  Extremities No Cyanosis, no clubbing,   Trace edema right leg.    Surgical dressing cdi on right leg, brace not rubbing on knee    Palpable pedal pulses  Neurologic exam  Awake, alert, oriented post egd  Skin without rash     Labs:     Results     Procedure Component Value Units Date/Time     Urine culture [[id number]] Collected:  07/08/14 1640    Specimen Information:  Urine / Urine, Clean Catch Updated:  07/10/14 1638    Narrative:      ORDER#: [id number]                                    ORDERED BY: Tobi Bastos  SOURCE: Urine, Clean Catch                           COLLECTED:  07/08/14 16:40  ANTIBIOTICS AT COLL.:                                RECEIVED :  07/08/14 20:20  Culture Urine                              FINAL       07/10/14 16:38   +  07/10/14   1,000 - 9,000 CFU/ML of normal urogenital or skin microbiota including             2,000 CFU/ML yeast, no further work  07/10/14   >100,000 CFU/ML Vancomycin Resistant Enterococcus faecium               Vancomycin Resistant Enterococcus    07/09/14   10,000 - 30,000 CFU/ML Gram negative rod               Lactose fermenting gram negative rod, No further work,             Questionable significance due to low quantity.    _____________________________________________________________________________  VRE         ANTIBIOTICS                     MIC  INTRP      _____________________________________________________________________________  Ampicillin                      >8     R        Daptomycin                       4     S        Gentamicin High Level Resistan <=500   S        Levofloxacin                    >4     R        Linezolid                        2     S        Penicillin                      >8     R        Streptomycin High Level Resist >1000   R        Tetracycline                    >8     R        Vancomycin                      >16    R        _____________________________________________________________________________            S=SUSCEPTIBLE     I=INTERMEDIATE     R=RESISTANT                            N/S=NON-SUSCEPTIBLE  _____________________________________________________________________________      Potassium [[id number]]  (Abnormal) Collected:  07/10/14 0845    Specimen Information:   Blood Updated:  07/10/14 0907     Potassium 3.1 ([name]) mEq/[name]     Narrative:      After of IV potassium infused    Basic Metabolic Panel [[id number]]  (Abnormal) Collected:  07/10/14 0420    Specimen Information:  Blood Updated:  07/10/14 0522     Glucose 94 mg/dL      BUN 5 ([name]) mg/dL      Creatinine 0.7 mg/dL      CALCIUM 8.1 mg/dL      Sodium 782 mEq/[name]      Potassium 2.9 ([name]) mEq/[name]      Chloride 108 mEq/[name]      CO2 26 mEq/[name]     GFR [[id number]] Collected:  07/10/14 0420     EGFR >60.0 Updated:  07/10/14 0522    Prothrombin time/INR [[id number]]  (Abnormal) Collected:  07/10/14 0420    Specimen Information:  Blood Updated:  07/10/14 0447     PT 15.3 (H) sec      PT INR 1.2 (H)      PT Anticoag. Given Within 48 hrs. None     CBC without differential [[id number]]  (Abnormal) Collected:  07/10/14 0420    Specimen Information:  Blood / Blood Updated:  07/10/14 0437     WBC 9.84 x10 3/uL      RBC 3.40 ([name]) x10 6/uL      Hgb 9.0 ([name]) g/dL      Hematocrit 56.2 ([name]) %      MCV 81.8 fL      MCH 26.5 ([name]) pg      MCHC 32.4 g/dL      RDW 14 %      Platelets 219 x10 3/uL      MPV 10.3 fL      Nucleated RBC 0 /100 WBC     RBC OR HOLD Blood Product [[id number]] Collected:  07/08/14 0232     RBC Leukoreduced [id number]    transfused Updated:  07/10/14 0033    Narrative:      ?Special Requirements->No special requirements  ?Surgery/ Procedure (to be Performed)->right knee I+D  ?Surgery/Procedure Date:->07/10/14  ?Is the patient pregnant?->No  ?Has the patient been transfused w/i the last 3 months?->Yes          Recent Labs  Lab 07/10/14  0420   WBC 9.84   HEMOGLOBIN 9.0*   HEMATOCRIT 27.8*   PLATELETS 219        Recent Labs  Lab 07/10/14  0845 07/10/14  0420 07/08/14  0232   SODIUM  --  145 143   POTASSIUM 3.1* 2.9* 3.8   CHLORIDE  --  108 107   CO2  --  26 24   BUN  --  5* 9   CREATININE  --  0.7 0.7   CALCIUM  --  8.1 8.8   ALBUMIN  --   --  2.8*   PROTEIN, TOTAL  --   --  6.6   BILIRUBIN, TOTAL  --   --  0.9   ALKALINE PHOSPHATASE  --    --  77   ALT  --   --  11   AST (SGOT)  --   --  16   GLUCOSE  --  94 93       Recent Labs  Lab 07/10/14  0420   PT INR 1.2*          Rads:     Radiology Results (24 Hour)     ** No results found for the last 24 hours. **          Signed by: Ray Church, MD

## 2014-07-10 NOTE — Progress Notes (Addendum)
07/10/14 08:22  Hypokalemia: ordered of KCl IV and KCl po STAT with stat K lab check after IV infusion    PROGRESS NOTE    Date Time: 07/10/2014 6:41 AM  Patient Name: [name], [name]      Assessment:   - s/p R TKA reimplantation on 07/01/14, discharged 07/06/14 on po Augment 875mg  BID and Aspirin 325mg  po daily x4 weeks.   -admitted to medicine for r/o GI bleed v gastritis w/ dark stools secondary to Fe tablets  -stool occult blood positive 10/1  -EGD 10/2: gastritis with superficial distal stomach ulcer, no acute bleeding    -R knee distal superficial wound dehiscence:   10/2 evening: Changed dressing and checked right knee incision. Patient has persistent bloody drainage (approx 3-4" circ on dressing) and now has 1.5 cm area of incision dehiscence.   Discussed with Dr Ahmed Prima who would like to take patient to OR 10/2 with Dr Campbell Lerner for right knee I+D and revision soft tissue closure.     Plan:   Continue PT - 50% WB RLE in [facility]KB locked in extension    Continue anticoagulation -SCDs, medicine holding ASA    GI workup: EGD 10/2, medical gastric ulcer treatment    Nutrition support for high risk wound: needs nutrition support (boost, juven, MVI, vit C) when regular diet ordered    Abx: zosyn per ID for R knee prophylaxis and UTI    Anemia (acute post operative blood loss and chronic disease): 1u PRBC transfused overnight, 2u on hold for OR tomorrow    R knee wound dehiscence: to OR today    D/c planning - home when medically clear, home health nursing, continue IV zosyn per ID, f/u ortho 2weeks post op, f/u ID 3-4 weeks post op    Subjective:   Pain controlled, tolerating diet, denies nausea/vomiting/diarrhea    Medications:     Current Facility-Administered Medications   Medication Dose Route Frequency   . amLODIPine  5 mg Oral Daily   . bisoprolol  5 mg Oral Daily   . cefTRIAXone  1 g Intravenous On Call to OR   . chlorhexidine  10 mL Mouth/Throat BID   . cloNIDine  0.1 mg Oral Daily   . gabapentin  300 mg Oral  Daily   . mupirocin   Nasal BID   . pantoprazole  40 mg Oral QAM AC   . piperacillin-tazobactam  4.5 g Intravenous Q8H SCH   . sucralfate  1 g Oral TID AC & HS   . tiotropium  18 mcg Inhalation QAM   . tranexamic acid (CYKLOKAPRON) IVPB  1,000 mg Intravenous On Call to OR   . vancomycin  1,000 mg Intravenous On Call to OR       Physical Exam:     Filed Vitals:    07/10/14 0400   BP: 183/79   Pulse: 77   Temp:    Resp: 13   SpO2: 95%       Intake and Output Summary (Last 24 hours) at Date Time    Intake/Output Summary (Last 24 hours) at 07/10/14 0641  Last data filed at 07/09/14 1544   Gross per 24 hour   Intake    225 ml   Output      0 ml   Net    225 ml       General appearance - no acute distress  Musculoskeletal -  Dressing C/D/I  Fires quad/TA/EHL/GSC  SILT SP/DP/TN  WWP distally  Calves soft, nontender bilateral    Labs:     Results     Procedure Component Value Units Date/Time    Basic Metabolic Panel [[id number]]  (Abnormal) Collected:  07/10/14 0420    Specimen Information:  Blood Updated:  07/10/14 0522     Glucose 94 mg/dL      BUN 5 ([name]) mg/dL      Creatinine 0.7 mg/dL      CALCIUM 8.1 mg/dL      Sodium 782 mEq/[name]      Potassium 2.9 ([name]) mEq/[name]      Chloride 108 mEq/[name]      CO2 26 mEq/[name]     GFR [[id number]] Collected:  07/10/14 0420     EGFR >60.0 Updated:  07/10/14 0522    Prothrombin time/INR [[id number]]  (Abnormal) Collected:  07/10/14 0420    Specimen Information:  Blood Updated:  07/10/14 0447     PT 15.3 (H) sec      PT INR 1.2 (H)      PT Anticoag. Given Within 48 hrs. None     CBC without differential [[id number]]  (Abnormal) Collected:  07/10/14 0420    Specimen Information:  Blood / Blood Updated:  07/10/14 0437     WBC 9.84 x10 3/uL      RBC 3.40 ([name]) x10 6/uL      Hgb 9.0 ([name]) g/dL      Hematocrit 01.0 ([name]) %      MCV 81.8 fL      MCH 26.5 ([name]) pg      MCHC 32.4 g/dL      RDW 14 %      Platelets 219 x10 3/uL      MPV 10.3 fL      Nucleated RBC 0 /100 WBC     RBC OR HOLD Blood Product [[id number]]  Collected:  07/08/14 0232     RBC Leukoreduced [id number]    transfused Updated:  07/10/14 0033    Narrative:      ?Special Requirements->No special requirements  ?Surgery/ Procedure (to be Performed)->right knee I+D  ?Surgery/Procedure Date:->07/10/14  ?Is the patient pregnant?->No  ?Has the patient been transfused w/i the last 3 months?->Yes    Urine culture [[id number]] Collected:  07/08/14 1640    Specimen Information:  Urine / Urine, Clean Catch Updated:  07/09/14 1902    Narrative:      ORDER#: [id number]                                    ORDERED BY: Tobi Bastos  SOURCE: Urine, Clean Catch                           COLLECTED:  07/08/14 16:40  ANTIBIOTICS AT COLL.:                                RECEIVED :  07/08/14 20:20  Culture Urine                              PRELIM      07/09/14 19:02   +  07/09/14   >100,000 CFU/ML Enterococcus species               Susceptibility to follow    07/09/14  10,000 - 30,000 CFU/ML Gram negative rod               Lactose fermenting gram negative rod, No further work,             Questionable significance due to low quantity.        Packed Red Blood Cells - Product [[id number]] Collected:  07/08/14 0232     RBC Leukoreduced [id number]    selected Updated:  07/09/14 1808    Narrative:      ?Special Requirements->No special requirements  ?Surgery/ Procedure (to be Performed)->right knee I+D  ?Surgery/Procedure Date:->07/10/14  ?Is the patient pregnant?->No  ?Has the patient been transfused w/i the last 3 months?->Yes    MRSA culture [[id number]] Collected:  07/08/14 0516    Specimen Information:  Body Fluid / Nasal/Throat ASC Admission Updated:  07/09/14 1000    Narrative:      ORDER#: [id number]                                    ORDERED BY: Elyn Peers  SOURCE: Nares and Throat                             COLLECTED:  07/08/14 05:16  ANTIBIOTICS AT COLL.:                                RECEIVED :  07/08/14 10:50  Culture MRSA Surveillance                  FINAL        07/09/14 10:00   +  07/09/14   Positive for Methicillin Resistant Staph aureus from Throat             Negative for Methicillin Resistant Staph aureus from Nares      CBC without differential [[id number]]  (Abnormal) Collected:  07/09/14 0702    Specimen Information:  Blood / Blood Updated:  07/09/14 0714     WBC 9.49 x10 3/uL      RBC 3.08 ([name]) x10 6/uL      Hgb 8.0 ([name]) g/dL      Hematocrit 53.6 ([name]) %      MCV 81.8 fL      MCH 26.0 ([name]) pg      MCHC 31.7 ([name]) g/dL      RDW 15 %      Platelets 211 x10 3/uL      MPV 9.9 fL      Nucleated RBC 0 /100 WBC           Physical Therapy:   Pain Score: 2-mild pain (07/10/14 0035)  Weight Bearing Percent: 50 (07/09/14 0800)  Ambulation Distance (Feet): 5 Feet (07/09/14 0800)  Right Lower Extremity ROM: unable to assess (07/09/14 0800)  Left Lower Extremity ROM: within functional limits (07/09/14 0800)              Rads:   Radiological Procedure reviewed.    Signed by: Collene Schlichter III

## 2014-07-10 NOTE — Op Note (Signed)
FULL OPERATIVE NOTE    Date Time: 07/10/2014 3:30 PM  Patient Name: [name]  Attending Physician: Rondel Baton, MD      Date of Operation:   07/10/2014    Providers Performing:   Surgeon(s):  Lane Hacker, MD  Ernie Avena, MD  Zara Council, MD  Ocie Bob, [place]    Circulator: Alyce Pagan, RN  Relief Scrub: Orson Slick  Scrub Person: Ricci Barker  Second Circulator: Lindaann Slough, RN    Operative Procedure:   Procedure(s):    Surgical preparation of right knee wound bed.  Medial calf fasciocutaneous flap.    Preoperative Diagnosis:   Pre-Op Diagnosis Codes:     * Wound dehiscence, initial encounter [T81.31XA]    Postoperative Diagnosis:   Post-Op Diagnosis Codes:     * Wound dehiscence, initial encounter [T81.31XA]    Indications:   78yo female s/p right TKA this past week who developed a wound dehiscence. Patient previously has had difficulty in healing her wounds, and required advanced wound care to finally achieve full healing. R/B/A were discussed patient and daughter, all questions were answered, and consent was signed by patient, and she wishes to proceed with surgery today.    Operative Notes:   Prior to initiation of the operative procedure a surgical time-out was taken and the patient's name, medical record, number, date-of-birth, and operative side were verified with the surgical consent form. Additionally it was verified that the appropriate peri-operative antibiotic administration was completed, and the correct operative instrumentation and implants were available. Patient was prepped and draped in standard surgical fashion. The right knee wound and tissue of questionable healing potential measured 5x4cm. This tissue was excised with #10 blade down to the allograft and joint capsule. A fasiocutaneous flap was then harvested from the medial calf. Hemostasis was achieved with electrocautery and suture ligation of bleeding veins. The flap was rotated into the wound and  sutured in place in a tension free manner using 2-0 clear pds, 0-pds buried sutures and 0-pds vertical mattress sutures, and staples. The knee was gently ranged in a limited fashion to assess strength of repair. The wound was irrigated with pulse lavage prior to closure. Wound culture was not obtained since patient is currently on IV abx. Xeroform, 4x4" gauze, abd pads, kerlix, ace wrap were applied. The knee immobilizer was replaced by Dr. Christell Constant, taking care not to place pressure on the medial calf flap. Patient was extubated and taken to PACU in stable condition.        Estimated Blood Loss:   20 mL    Implants:   * No implants in log *    Drains:   Drains: no    Specimens:       Complications:   None.      Signed by: Ernie Avena, MD

## 2014-07-10 NOTE — Progress Notes (Signed)
Pharmacy Note: Vancomycin Dosing Consult    78 yo female s/p right TKA on 07/01/14 now presenting with wound dehiscence. Returned to OR today for repair, pt to be on vancomycin empirically. Vanco 1000 mg q12h currently ordered. Pharmacy has been asked to assist with dosing.     Patient weight: 98 kg (216 lb 0.8 oz)   CREATININE: 0.7 (07/10/14 0420)  Estimated creatinine clearance - 74.8 mL/min   Baseline SCr: 0.7    Nephrotoxic drugs administered in the past 48 hours:    Possibly took aspirin, celecoxib at home PTA    WBC:  Recent Labs  Lab 07/10/14  0420   WBC 9.84     Tmax in last 24h: 98.9    Cultures:   Date Source  Organism & Pertinent Susceptibilities     07/08/14 MRSA screen, nares positive   07/08/14 MRSA screen, throat positive   07/08/14 urine VRE, lactose-fermenting GNR (low qty, no further workup)       Imaging and other pertinent findings: None    Vancomycin Indication: Empiric treatment for possible  Vancomycin Target Trough Level: 10-20 mg/L   Suggested:   10-15 mg/L for uncomplicated infections (UTI or cellulitus)   15-20 mg/L for severe infections (HCAP, PJI, sepsis, ect.)   15 - 25 mg/L pre-dialysis for hemodialysis patients    Recommendations:   Vancomycin 1000 mg x1 pre-op given 07/10/14 @ 1230  Maintenance regimen: Increase vanc to 1250 mg IV q12h based on pt wt, renal function, to start tonight at 2200  Vancomycin level: Trough ordered prior to 4th dose of 1250 mg, on 07/12/14 @ 0930      Date 07/10/14 07/11/14 07/12/14       Dose 1000 mg x 1, then 1250 mg q12h 1250 mg q12h 1250 mg q12h       Scr & CrCl   0.7/ 74.8 ml/min         Vanco level (indicate if random or trough)   Trough ordered for 0930       Level drawn correctly?          Comments            Thank you,  Haskel Schroeder, PharmD  Phone: [phone]

## 2014-07-10 NOTE — Progress Notes (Signed)
Patient tolerated 1 unit of PRBC during the night. Denies chest pain and shortness of breath. Noted with elevated BP prior to blood transfusion (refer to flow sheet). Orders obtained for labetalol as needed with parameters. Medicated once during the night for Right knee discomfort. Patient is currently NPO for Right Knee revision this am.

## 2014-07-10 NOTE — Brief Op Note (Addendum)
POST OP PLAN:   -IV vancomycin and zosyn through the weekend, resume PO augmentin on Monday  -resume PT: 50% WB RLE in [facility] locked in extension, Dr Campbell Lerner requested medial side of HKB be removed unitil Monday to prevent pressure on wound  -continue SCDs while ASA being held for GI bleed  -plan for wound check on Monday and D/C home if looks ok  -plan for 2 weeks in extension in [facility]  -Dr Campbell Lerner would like to see patient in the wound clinic next Friday (10/9)    Surgical Debridement Procedure  Excisional (Surgical removal or cutting awayof devitalized tissue, necrosis, or slough)    Date:07/10/2014   Patient Name: [name],[name]      Preoperative Diagnosis:   Wound dehiscence, initial encounter [T81.31XA]    Postoperative Diagnosis:   * No post-op diagnosis entered *    Informed Consent:   The risks and benefits of the procedure were explained to the patient, who elected to proceed with the procedure. A consent was obtained.    Indications:   right knee incision wound dehiscence      Procedure:   Procedure(s) with comments:  REVISION SOFT TISSUE CLOSURE RT KNEE WOUND DEHISCENCE (N/A) - (BHATT PLASTIC TRAY, GOYAL TK SET/SPECIALS SET- NO IMPLANTS)   **IP-618-1/CH**  INCISION & DRAINAGE, WOUND, CLOSURE (N/A)    The patient was taken to the operating room, placed on the operating table   in supine position. A surgical timeout was performed. Combined Spinal Epidural anesthesia was administered. The area was prepped and draped in the standard sterile fashion.   Tourniquet applied? No  Depth of Debridement:down to and including:  Fascia;  Patient to recovery room in stable condition.        Signed by: Zara Council, MD

## 2014-07-10 NOTE — Plan of Care (Signed)
Problem: Safety  Goal: Patient will be free from injury during hospitalization  Outcome: Progressing  Bed low, call bell in reach, hourly rounding, white boards done    Problem: Pain  Goal: Patient's pain/discomfort is manageable  Outcome: Progressing  C/O pain to the knee - gave one Norco as ordered and gave gabapentin    Problem: Moderate/High Fall Risk Score >/=15  Goal: Patient will remain free of falls  Outcome: Progressing    Problem: Potential for Compromised Skin Integrity  Goal: Skin integrity is maintained or improved  Assess and monitor skin integrity. Identify patients at risk for skin breakdown on admission and per policy. Collaborate with interdisciplinary team and initiate plans and interventions as needed.   Outcome: Progressing  Skin is intact T&R  Goal: Nutritional status is improving  Monitor and assess patient for malnutrition (ex- brittle hair, bruises, dry skin, pale skin and conjunctiva, muscle wasting, smooth red tongue, and disorientation). Collaborate with interdisciplinary team and initiate plan and interventions as ordered. Monitor patient's weight and dietary intake as ordered or per policy. Utilize nutrition screening tool and intervene per policy. Determine patient's food preferences and provide high-protein, high-caloric foods as appropriate.   Outcome: Progressing  Good appetite but NPO since midnight for procedure    Problem: GI Bleed  Goal: Fluid and electrolyte balance are achieved/maintained  Outcome: Progressing  Potassium low - gave PO and K ryder, no seizures, labs OK, A&O x4, no signes of acitive bleeding , one U PRBC given this morning,

## 2014-07-10 NOTE — Progress Notes (Addendum)
PROGRESS NOTE    Date Time: 07/10/2014 12:19 PM  Patient Name: [name], [name]      Assessment/ Plan:   1) Upper GI bleed due to Gastritis - cont to hold NSAIDs. Tolerating diet s/p EGD. Continue PPI and carafate.   2) Stable Chronic Anemia - monitor.   3) PJI, S/p TKA revision reimplantation 07/01/14 - on IV abx, was at home on amoxacillin  4) Hx Non-healing surgical wound of right knee, with wound dehiscence and drainage - patient taken to OR today for revision. F/u ortho and plastic surgery.    5) Uncontrolled Hypertension - BP elevated, continue home medications. Cont bisoprolol and clonidine, increase amlodipine to 10mg . On hydralazine PRN (change to PO).  6) COPD, stable   7) ESBL, MRSA hx -on isolation   8) Hypokalemia - replaced, resume home potassium replacement    Disposition - transfer to medical/ortho floor.       Subjective:   No evidence of GI bleed. Tolerating food. Now NPO for Right TKA revision for wound dehiscence.      Medications:     Current Facility-Administered Medications   Medication Dose Route Frequency   . [MAR Hold] amLODIPine  10 mg Oral Daily   . [MAR Hold] bisoprolol  5 mg Oral Daily   . [MAR Hold] cefTRIAXone  1 g Intravenous On Call to OR   . [MAR Hold] chlorhexidine  10 mL Mouth/Throat BID   . [MAR Hold] cloNIDine  0.1 mg Oral Daily   . [MAR Hold] gabapentin  300 mg Oral Daily   . [MAR Hold] mupirocin   Nasal BID   . [MAR Hold] pantoprazole  40 mg Oral QAM AC   . [MAR Hold] piperacillin-tazobactam  4.5 g Intravenous Q8H SCH   . [MAR Hold] potassium chloride  20 mEq Oral BID   . [MAR Hold] sucralfate  1 g Oral TID AC & HS   . [MAR Hold] tiotropium  18 mcg Inhalation QAM   . [MAR Hold] tranexamic acid (CYKLOKAPRON) IVPB  1,000 mg Intravenous On Call to OR   . [MAR Hold] vancomycin  1,000 mg Intravenous On Call to OR       Review of Systems:   A comprehensive review of systems was: History obtained from the patient  General ROS: negative  Psychological ROS: negative  Respiratory ROS: no  cough, shortness of breath, or wheezing  Cardiovascular ROS: no chest pain or dyspnea on exertion  Gastrointestinal ROS: positive for - abdominal pain  Genito-Urinary ROS: no dysuria, trouble voiding, or hematuria  Musculoskeletal ROS: positive for - joint pain  Neurological ROS: no TIA or stroke symptoms  Dermatological ROS: negative    Physical Exam:     Filed Vitals:    07/10/14 1200   BP: 175/76   Pulse: 72   Temp:    Resp:    SpO2:        Intake and Output Summary (Last 24 hours) at Date Time    Intake/Output Summary (Last 24 hours) at 07/10/14 1219  Last data filed at 07/10/14 0800   Gross per 24 hour   Intake    225 ml   Output    300 ml   Net    -75 ml       General appearance - alert, well appearing, and in no distress  Mental status - alert, oriented to person, place, and time  Eyes - pupils equal and reactive, extraocular eye movements intact  Neck - supple, no  significant adenopathy  Chest - clear to auscultation, no wheezes, rales or rhonchi, symmetric air entry  Heart - normal rate, regular rhythm, normal S1, S2, no murmurs, rubs, clicks or gallops  Abdomen - soft, nontender, nondistended, no masses or organomegaly  Neurological - alert, oriented, normal speech, no focal findings or movement disorder noted  Extremities - peripheral pulses normal, no pedal edema, no clubbing or cyanosis, right knee immobilizer    Labs:     Results     Procedure Component Value Units Date/Time    Potassium [[id number]]  (Abnormal) Collected:  07/10/14 0845    Specimen Information:  Blood Updated:  07/10/14 0907     Potassium 3.1 (L) mEq/L     Narrative:      After of IV potassium infused    Basic Metabolic Panel [[id number]]  (Abnormal) Collected:  07/10/14 0420    Specimen Information:  Blood Updated:  07/10/14 0522     Glucose 94 mg/dL      BUN 5 (L) mg/dL      Creatinine 0.7 mg/dL      CALCIUM 8.1 mg/dL      Sodium 469 mEq/L      Potassium 2.9 (L) mEq/L      Chloride 108 mEq/L      CO2 26 mEq/L     GFR [[id number]]  Collected:  07/10/14 0420     EGFR >60.0 Updated:  07/10/14 0522    Prothrombin time/INR [[id number]]  (Abnormal) Collected:  07/10/14 0420    Specimen Information:  Blood Updated:  07/10/14 0447     PT 15.3 (H) sec      PT INR 1.2 (H)      PT Anticoag. Given Within 48 hrs. None     CBC without differential [[id number]]  (Abnormal) Collected:  07/10/14 0420    Specimen Information:  Blood / Blood Updated:  07/10/14 0437     WBC 9.84 x10 3/uL      RBC 3.40 (L) x10 6/uL      Hgb 9.0 (L) g/dL      Hematocrit 74.2 (L) %      MCV 81.8 fL      MCH 26.5 (L) pg      MCHC 32.4 g/dL      RDW 14 %      Platelets 219 x10 3/uL      MPV 10.3 fL      Nucleated RBC 0 /100 WBC     RBC OR HOLD Blood Product [[id number]] Collected:  07/08/14 0232     RBC Leukoreduced [id number]    transfused Updated:  07/10/14 0033    Narrative:      ?Special Requirements->No special requirements  ?Surgery/ Procedure (to be Performed)->right knee I+D  ?Surgery/Procedure Date:->07/10/14  ?Is the patient pregnant?->No  ?Has the patient been transfused w/i the last 3 months?->Yes    Urine culture [[id number]] Collected:  07/08/14 1640    Specimen Information:  Urine / Urine, Clean Catch Updated:  07/09/14 1902    Narrative:      ORDER#: [id number]                                    ORDERED BY: Tobi Bastos  SOURCE: Urine, Clean Catch                           COLLECTED:  07/08/14 16:40  ANTIBIOTICS AT COLL.:                                RECEIVED :  07/08/14 20:20  Culture Urine                              PRELIM      07/09/14 19:02   +  07/09/14   >100,000 CFU/ML Enterococcus species               Susceptibility to follow    07/09/14   10,000 - 30,000 CFU/ML Gram negative rod               Lactose fermenting gram negative rod, No further work,             Questionable significance due to low quantity.        Packed Red Blood Cells - Product [[id number]] Collected:  07/08/14 0232     RBC Leukoreduced [id number]    selected Updated:  07/09/14 1808     Narrative:      ?Special Requirements->No special requirements  ?Surgery/ Procedure (to be Performed)->right knee I+D  ?Surgery/Procedure Date:->07/10/14  ?Is the patient pregnant?->No  ?Has the patient been transfused w/i the last 3 months?->Yes              Rads:   Radiological Procedure reviewed  Radiology Results (24 Hour)     ** No results found for the last 24 hours. **        Signed by: Pearletha Forge, MD

## 2014-07-10 NOTE — Plan of Care (Signed)
Problem: Potential for Compromised Skin Integrity  Goal: Skin integrity is maintained or improved  Assess and monitor skin integrity. Identify patients at risk for skin breakdown on admission and per policy. Collaborate with interdisciplinary team and initiate plans and interventions as needed.   Outcome: Progressing  Patient's skin is intact.  Adl care provided during the shift  Patient is continent of bladder and bowels  Will continue with current interventions.    Problem: GI Bleed  Goal: Elimination patterns are normal or improving  Outcome: Progressing  Patient noted with small black tarry stool once during the shift.  Denies nausea and abdominal discomfort  Medicated once for Right Knee pain during the night.

## 2014-07-10 NOTE — Progress Notes (Signed)
Pt has been down in the or SINCE 1030. She will be transferred to 414 and I just called her new nurse [facility] with report. Daughter is hear and gathering her belongings.

## 2014-07-10 NOTE — Transfer of Care (Signed)
Anesthesia Transfer of Care Note    Patient: [name]    Procedures performed: Procedure(s) with comments:  REVISION SOFT TISSUE CLOSURE RT KNEE WOUND DEHISCENCE - (BHATT PLASTIC TRAY, GOYAL TK SET/SPECIALS SET- NO IMPLANTS)   **IP-618-1/CH**  INCISION & DRAINAGE, WOUND, CLOSURE    Anesthesia type: Spinal    Patient location:Phase I PACU    Last vitals:   Filed Vitals:    07/10/14 1505   BP: 125/62   Pulse: 63   Temp: 36.9 C (98.4 F)   Resp: 15   SpO2: 100%       Post pain: Patient not complaining of pain, continue current therapy      Mental Status:sedated    Respiratory Function: tolerating nasal cannula    Cardiovascular: stable    Nausea/Vomiting: patient not complaining of nausea or vomiting    Hydration Status: adequate    Post assessment: no apparent anesthetic complications

## 2014-07-10 NOTE — Anesthesia Preprocedure Evaluation (Signed)
Anesthesia Evaluation    AIRWAY    Mallampati: II    TM distance: >3 FB  Neck ROM: full  Mouth Opening:full   CARDIOVASCULAR    cardiovascular exam normal       DENTAL    no notable dental hx     PULMONARY    pulmonary exam normal     OTHER FINDINGS                      Anesthesia Plan    ASA 3     CSE                                 informed consent obtained

## 2014-07-10 NOTE — Anesthesia Postprocedure Evaluation (Signed)
Anesthesia Post Evaluation    Patient: [name]    Procedures performed: Procedure(s) with comments:  REVISION SOFT TISSUE CLOSURE RT KNEE WOUND DEHISCENCE - (BHATT PLASTIC TRAY, GOYAL TK SET/SPECIALS SET- NO IMPLANTS)   **IP-618-1/CH**  INCISION & DRAINAGE, WOUND, CLOSURE    Anesthesia type: Combined Spinal/Epidural    Patient location:[facility] Floor    Last vitals:   Filed Vitals:    07/10/14 1755   BP: 147/82   Pulse: 66   Temp:    Resp: 18   SpO2:        Post pain: Patient not complaining of pain, continue current therapy      Mental Status:awake and alert     Respiratory Function: tolerating room air    Cardiovascular: stable    Nausea/Vomiting: patient not complaining of nausea or vomiting    Hydration Status: adequate    Post assessment: no apparent anesthetic complications, no reportable events and Patient was able to participate in post-op evaluation but recovery from regional anesthesia has not occured and was not expected to occur within this time frame.

## 2014-07-10 NOTE — PT Progress Note (Signed)
Physical Therapy Treatment    Patient:  [name]        MRN#:  [id number]  Unit:  [facility]        Room/Bed:  U272/Z366-44    Time of treatment: Start Time: 1015 Stop Time: 1030  Time Calculation (min): 15 min  PT Received On: 07/10/14    Treatment #: PT Visit Number: 1/7    Precautions  Weight Bearing Status: RLE partial weight bearing  Weight Bearing Percent: 10  Total Knee Replacement: hinge brace;at all times (locked in extension)  Precaution Instructions Given to Patient: Yes  MD orders for Exercises: active     Medical Diagnosis:   Acute GI bleeding [578.9 (ICD-9-CM)]  Surgical Procedure:Right   Procedure(s):  EGD performed by  Rondel Baton, MD on 07/09/2014.    Patient's medical condition is appropriate for Physical Therapy  intervention at this time.  Patient cleared by nurse to participate in PT session, nursing reports patient going for surgery.    ASSESSMENT   Patient heading for surgery this pm.    Goals per Eval/Re-eval:     Goals  Goal Formulation: With patient  Time for Goal Acheivement: 7 visits  Goals: Select goal  Pt Will Go Supine To Sit: with supervision, to maximize functional mobility and independence  Pt Will Perform Sit to Stand: with supervision, to maximize functional mobility and independence  Pt Will Transfer Bed/Chair: with supervision, to maximize functional mobility and independence  Pt Will Transfer to Toilet: with rolling walker, with supervision, to maximize functional mobility and independence  Pt Will Transfer to Tub/Shower: with rolling walker, with supervision, to maximize functional mobility and independence  PT Will Advertising account executive Transfer Technique: with rolling walker, with supervision, to maximize functional mobility and independence  Pt Will Ambulate: 151-200 feet, with rolling walker, with stand by assist, to maximize functional mobility and independence  Pt Will Go Up / Down Stairs: 3-5 stairs, with contact guard assist, to maximize functional mobility  and independence  Pt Will Perform Home Exer Program: with minimal assist, to maximize functional mobility and independence  Pt Will Perform LE Dressing w/Device: with minimal assist, With adaptive equipment, to maximize functional mobility and independence    PLAN     Recommendation  Discharge Recommendation: Home with supervision, Home with home health PT  PT - Next Visit Recommendation: 07/10/14  PT Frequency: 7x/wk    Continue plan of care.    SUBJECTIVE   I am going to have surgery again and will prob. be on bed rest for a week.                OBJECTIVE     Patient is in bed seen 1 Day Post-Op with IV, dressings, telemetry and brace.                                       Sensation is intact                         Therapeutic Exercise  Quad Sets: 5  Glute Sets: 5  Ankle Pumps: 5       Self Care and Home Management:  Pt did not perform self care activities at this time.    Educated the patient to role of physical therapy, plan of care, goals  of therapy.  Patient continues to need  focused education on Physical Therapy.    Patient is in bed with all needs provided and call bell within reach.        Signature: Louann Liv, PTA 07/10/2014 10:55 AM

## 2014-07-10 NOTE — Plan of Care (Signed)
Problem: Health Promotion  Goal: Knowledge - health resources  Extent of understanding and conveyed about healthcare resources.   Intervention: Discharge planning  Resume homecare with [facility] upon discharge. Referrals sent.

## 2014-07-10 NOTE — Plan of Care (Signed)
Problem: Safety  Goal: Patient will be free from injury during hospitalization  Outcome: Progressing  Intervention: Ensure appropriate safety devices are available at the bedside  Walker      Problem: Pain  Goal: Patient's pain/discomfort is manageable  Outcome: Progressing  Intervention: Offer non-pharmocological pain management interventions  Ice      Problem: Moderate/High Fall Risk Score >/=15  Goal: Patient will remain free of falls  Outcome: Progressing    Problem: Potential for Compromised Skin Integrity  Goal: Skin integrity is maintained or improved  Assess and monitor skin integrity. Identify patients at risk for skin breakdown on admission and per policy. Collaborate with interdisciplinary team and initiate plans and interventions as needed.   Outcome: Progressing

## 2014-07-10 NOTE — Anesthesia Procedure Notes (Signed)
Combined Spinal epidural  Patient location during procedure: pre-opReason for block: Primary Anesthesia In the OR  Requested by Surgeon: Yes   Start time: 07/10/2014 12:25 PM  End time: 07/10/2014 12:40 PM  Staffing  Anesthesiologist: Juliet Rude V  Performed by: anesthesiologist Preanesthetic Checklist   Timeout Completed:  07/10/2014 12:25 PM  CSE  Patient monitoring: Pulse oximetry, EKG and Nasal cannula O2  Premedication: Yes    Patient position: sitting  Sterile Technique: Betadine, ChloraPrep, sterile drape, mask  and sterile gloves  Skin Local: bupivicaine 0.5%  Interspace: L2-3  Number of attempts: 1    Approach: midline    Injection technique: LOR air  Needle type: Touhy needle     Needle gauge: 17  CSF Return: Yes  Blood Return: No              Catheter type: side hole    Catheter size: 20 G    Catheter at skin depth: 12 cm  CSF Return: Yes  Blood Return: No            Assessment   Sensory level: T10  Block Outcome: patient tolerated procedure well, no complications and successful block  Additional Notes  SPINAL CATH

## 2014-07-10 NOTE — Anesthesia Postprocedure Evaluation (Signed)
Anesthesia Post Evaluation    Patient: [name]    Procedures performed: Procedure(s):  EGD    Anesthesia type: General TIVA    Patient location:ICU    Last vitals:   Filed Vitals:    07/10/14 1003   BP: 199/78   Pulse:    Temp:    Resp:    SpO2:        Post pain: Patient not complaining of pain, continue current therapy      Mental Status:awake and alert     Respiratory Function: tolerating room air    Cardiovascular: stable    Nausea/Vomiting: patient not complaining of nausea or vomiting    Hydration Status: adequate    Post assessment: no apparent anesthetic complications, no reportable events and no evidence of recall

## 2014-07-11 LAB — CBC
Hematocrit: 29.9 % — ABNORMAL LOW (ref 37.0–47.0)
Hgb: 9.5 g/dL — ABNORMAL LOW (ref 12.0–16.0)
MCH: 26.4 pg — ABNORMAL LOW (ref 28.0–32.0)
MCHC: 31.8 g/dL — ABNORMAL LOW (ref 32.0–36.0)
MCV: 83.1 fL (ref 80.0–100.0)
MPV: 10.7 fL (ref 9.4–12.3)
Nucleated RBC: 0 /100 WBC (ref 0–1)
Platelets: 242 10*3/uL (ref 140–400)
RBC: 3.6 10*6/uL — ABNORMAL LOW (ref 4.20–5.40)
RDW: 15 % (ref 12–15)
WBC: 10.74 10*3/uL (ref 3.50–10.80)

## 2014-07-11 LAB — BASIC METABOLIC PANEL
BUN: 7 mg/dL (ref 7–19)
CO2: 25 mEq/L (ref 22–29)
Calcium: 7.8 mg/dL — ABNORMAL LOW (ref 7.9–10.2)
Chloride: 108 mEq/L (ref 100–111)
Creatinine: 1.2 mg/dL — ABNORMAL HIGH (ref 0.6–1.0)
Glucose: 103 mg/dL — ABNORMAL HIGH (ref 70–100)
Potassium: 3.3 mEq/L — ABNORMAL LOW (ref 3.5–5.1)
Sodium: 144 mEq/L (ref 136–145)

## 2014-07-11 LAB — GFR: EGFR: 52.8

## 2014-07-11 MED ORDER — POTASSIUM CHLORIDE 10 MEQ/100ML IV SOLN
10.0000 meq | Freq: Once | INTRAVENOUS | Status: AC
Start: 2014-07-11 — End: 2014-07-11
  Administered 2014-07-11: 10 meq via INTRAVENOUS
  Filled 2014-07-11: qty 100

## 2014-07-11 MED ORDER — SODIUM CHLORIDE 0.9 % IV SOLN
6.0000 mg/kg | INTRAVENOUS | Status: DC
Start: 2014-07-11 — End: 2014-07-13
  Administered 2014-07-11 – 2014-07-13 (×3): 625 mg via INTRAVENOUS
  Filled 2014-07-11 (×3): qty 625

## 2014-07-11 MED ORDER — SODIUM CHLORIDE 0.9 % IV SOLN
125.0000 mL/h | INTRAVENOUS | Status: AC
Start: 2014-07-11 — End: 2014-07-12
  Administered 2014-07-11: 125 mL/h via INTRAVENOUS

## 2014-07-11 NOTE — Plan of Care (Signed)
Problem: Health Promotion  Goal: Knowledge - disease process  Extent of understanding conveyed about a specific disease process.   Outcome: Progressing  RLE covered with ACE wrap. Immobilizer in place.     Problem: Safety  Goal: Patient will be free from injury during hospitalization  Outcome: Progressing  Encouraged to call for assistance. Rounded hourly.    Problem: Pain  Goal: Patient's pain/discomfort is manageable  Outcome: Progressing  Comfortably resting in bed.    Comments:   Able to wiggle right toes, good cap refill, positive pulses.

## 2014-07-11 NOTE — Anesthesia Postprocedure Evaluation (Signed)
Anesthesia Post Evaluation    Patient: [name]    Procedures performed: Procedure(s) with comments:  REVISION SOFT TISSUE CLOSURE RT KNEE WOUND DEHISCENCE - (BHATT PLASTIC TRAY, GOYAL TK SET/SPECIALS SET- NO IMPLANTS)   **IP-618-1/CH**  INCISION & DRAINAGE, WOUND, CLOSURE    Anesthesia type: Combined Spinal/Epidural    Patient location:[facility] Floor    Last vitals:   Filed Vitals:    07/11/14 0818   BP: 157/78   Pulse: 74   Temp: 36.7 C (98.1 F)   Resp: 18   SpO2: 94%       Post pain: Patient not complaining of pain, continue current therapy      Mental Status:awake and alert     Respiratory Function: tolerating room air    Cardiovascular: stable    Nausea/Vomiting: patient not complaining of nausea or vomiting    Hydration Status: adequate    Post assessment: no apparent anesthetic complications and no reportable events

## 2014-07-11 NOTE — Progress Notes (Signed)
PROGRESS NOTE    Date Time: 07/11/2014 12:34 PM  Patient Name: [name], [name]      Assessment:    s/p R knee soft tissue rotational flap on  10/2, doing well    Plan:   -IV vancomycin and zosyn through the weekend, resume PO augmentin on Monday  -resume PT: 50% WB RLE in HKB locked in extension, Dr Campbell Lerner requested medial side of HKB be removed unitil Monday to prevent pressure on wound  -continue SCDs while ASA being held for GI bleed  -plan for wound check on Monday and D/C home if looks ok  -plan for 2 weeks in extension in [facility]  -Dr Campbell Lerner would like to see patient in the wound clinic next Friday (10/9)    Subjective:   Pain controlled, tolerating diet    Medications:     Current Facility-Administered Medications   Medication Dose Route Frequency   . amLODIPine  10 mg Oral Daily   . bisoprolol  5 mg Oral Daily   . chlorhexidine  10 mL Mouth/Throat BID   . cloNIDine  0.1 mg Oral Daily   . DAPTOmycin (CUBICIN) IVPB  6 mg/kg Intravenous Q24H SCH   . gabapentin  300 mg Oral Daily   . multivitamin  1 tablet Oral Daily   . mupirocin   Nasal BID   . pantoprazole  40 mg Oral QAM AC   . piperacillin-tazobactam  4.5 g Intravenous Q8H SCH   . potassium chloride  20 mEq Oral BID   . potassium chloride  10 mEq Intravenous Once   . sucralfate  1 g Oral TID AC & HS   . tiotropium  18 mcg Inhalation QAM   . vitamin C  500 mg Oral BID       Physical Exam:     Filed Vitals:    07/11/14 1026   BP: 163/59   Pulse: 74   Temp:    Resp: 20   SpO2: 97%       Intake and Output Summary (Last 24 hours) at Date Time    Intake/Output Summary (Last 24 hours) at 07/11/14 1234  Last data filed at 07/11/14 0500   Gross per 24 hour   Intake   1000 ml   Output    870 ml   Net    130 ml       General appearance - no acute distress  Musculoskeletal -  Dressing C/D/I, HKB in place  Fires quad/TA/EHL/GSC  SILT SP/DP/TN  WWP distally  Calves soft, nontender bilateral    Labs:     Results     Procedure Component Value Units Date/Time    Basic Metabolic  Panel [[id number]]  (Abnormal) Collected:  07/11/14 1105    Specimen Information:  Blood Updated:  07/11/14 1218     Glucose 103 (H) mg/dL      BUN 7 mg/dL      Creatinine 1.2 (H) mg/dL      CALCIUM 7.8 (L) mg/dL      Sodium 119 mEq/L      Potassium 3.3 (L) mEq/L      Chloride 108 mEq/L      CO2 25 mEq/L     GFR [[id number]] Collected:  07/11/14 1105     EGFR 52.8 Updated:  07/11/14 1218    CBC without differential [[id number]]  (Abnormal) Collected:  07/11/14 0500    Specimen Information:  Blood / Blood Updated:  07/11/14 0554     WBC 10.74 x10  3/uL      RBC 3.60 (L) x10 6/uL      Hgb 9.5 (L) g/dL      Hematocrit 16.1 (L) %      MCV 83.1 fL      MCH 26.4 (L) pg      MCHC 31.8 (L) g/dL      RDW 15 %      Platelets 242 x10 3/uL      MPV 10.7 fL      Nucleated RBC 0 /100 WBC     Urine culture [[id number]] Collected:  07/08/14 1640    Specimen Information:  Urine / Urine, Clean Catch Updated:  07/10/14 1638    Narrative:      ORDER#: [id number]                                    ORDERED BY: Tobi Bastos  SOURCE: Urine, Clean Catch                           COLLECTED:  07/08/14 16:40  ANTIBIOTICS AT COLL.:                                RECEIVED :  07/08/14 20:20  Culture Urine                              FINAL       07/10/14 16:38   +  07/10/14   1,000 - 9,000 CFU/ML of normal urogenital or skin microbiota including             2,000 CFU/ML yeast, no further work  07/10/14   >100,000 CFU/ML Vancomycin Resistant Enterococcus faecium               Vancomycin Resistant Enterococcus    07/09/14   10,000 - 30,000 CFU/ML Gram negative rod               Lactose fermenting gram negative rod, No further work,             Questionable significance due to low quantity.    _____________________________________________________________________________                                      VRE         ANTIBIOTICS                     MIC  INTRP       _____________________________________________________________________________  Ampicillin                      >8     R        Daptomycin                       4     S        Gentamicin High Level Resistan <=500   S        Levofloxacin                    >4     R        Linezolid  2     S        Penicillin                      >8     R        Streptomycin High Level Resist >1000   R        Tetracycline                    >8     R        Vancomycin                      >16    R        _____________________________________________________________________________            S=SUSCEPTIBLE     I=INTERMEDIATE     R=RESISTANT                            N/S=NON-SUSCEPTIBLE  _____________________________________________________________________________            Physical Therapy:   Pain Score: 5-moderate pain (07/11/14 1210)  Weight Bearing Percent: 10 (07/10/14 1000)  Ambulation Distance (Feet): 5 Feet (07/09/14 0800)  Right Lower Extremity ROM: unable to assess (07/09/14 0800)  Left Lower Extremity ROM: within functional limits (07/09/14 0800)              Rads:   Radiological Procedure reviewed.    Signed by: Mathis Fare

## 2014-07-11 NOTE — Progress Notes (Signed)
Ace wrap dsg and immobilizer RLE  c/d/i and in place neurovascular wnl no c/o of pain or  numbness tingling . Plan of care continue.

## 2014-07-11 NOTE — Plan of Care (Signed)
Problem: Safety  Goal: Patient will be free from injury during hospitalization  Outcome: Progressing  Safety measures enforced and maintained siderails up x2 call bell within reach rounding by staff     Problem: Pain  Goal: Patient's pain/discomfort is manageable  Outcome: Progressing  RLE neurovascular wnl  cap refill < 3 sec no reports of numbness or tingling , she was medicated for c/o achy sharp ppain with ordered tramadol with reported relief, will continue to monitor and maintain pain control       Problem: Potential for Compromised Skin Integrity  Goal: Nutritional status is improving  Monitor and assess patient for malnutrition (ex- brittle hair, bruises, dry skin, pale skin and conjunctiva, muscle wasting, smooth red tongue, and disorientation). Collaborate with interdisciplinary team and initiate plan and interventions as ordered. Monitor patient's weight and dietary intake as ordered or per policy. Utilize nutrition screening tool and intervene per policy. Determine patient's food preferences and provide high-protein, high-caloric foods as appropriate.   Outcome: Progressing  Patient tolerating >50% of ordered  diet     Problem: Tissue integrity  Goal: Damaged tissue is healing and protected  Outcome: Progressing  Surgical dsg ace wrap and immobilizer c/d/i o rle  and in place

## 2014-07-11 NOTE — Progress Notes (Signed)
PROGRESS NOTE    Date Time: 07/11/2014 12:22 PM  Patient Name: [name], [name]      Assessment/ Plan:   1) Upper GI bleed due to Gastritis - cont to hold NSAIDs. Tolerating diet s/p EGD. Continue PPI and carafate.   2) Stable Chronic Anemia - monitor.   3) PJI, S/p TKA revision reimplantation 07/01/14 - on IV abx, was at home on amoxacillin  4) Hx Non-healing surgical wound of right knee, with wound dehiscence and drainage , s/p Revision soft tissue closure (10/2)  5)  Hypertension - BP elevated, continue home medications. Cont bisoprolol and clonidine, increase amlodipine to 10mg . On hydralazine PRN (change to PO).  6) COPD, stable   7) ESBL, MRSA hx -on isolation   8) Hypokalemia - replaced, resume home potassium replacement/  9? Elevated cr today ( 1.2), will give IV fluids.    Disposition - transfer to medical/ortho floor.       Subjective:   No evidence of GI bleed. Tolerating food. Now NPO for Right TKA revision for wound dehiscence.      Medications:     Current Facility-Administered Medications   Medication Dose Route Frequency   . amLODIPine  10 mg Oral Daily   . bisoprolol  5 mg Oral Daily   . chlorhexidine  10 mL Mouth/Throat BID   . cloNIDine  0.1 mg Oral Daily   . DAPTOmycin (CUBICIN) IVPB  6 mg/kg Intravenous Q24H SCH   . gabapentin  300 mg Oral Daily   . multivitamin  1 tablet Oral Daily   . mupirocin   Nasal BID   . pantoprazole  40 mg Oral QAM AC   . piperacillin-tazobactam  4.5 g Intravenous Q8H SCH   . potassium chloride  20 mEq Oral BID   . sucralfate  1 g Oral TID AC & HS   . tiotropium  18 mcg Inhalation QAM   . vitamin C  500 mg Oral BID       Review of Systems:   A comprehensive review of systems was: History obtained from the patient  General ROS: negative  Psychological ROS: negative  Respiratory ROS: no cough, shortness of breath, or wheezing  Cardiovascular ROS: no chest pain or dyspnea on exertion  Gastrointestinal ROS: positive for - abdominal pain  Genito-Urinary ROS: no dysuria, trouble  voiding, or hematuria  Musculoskeletal ROS: positive for - joint pain  Neurological ROS: no TIA or stroke symptoms  Dermatological ROS: negative    Physical Exam:     Filed Vitals:    07/11/14 1026   BP: 163/59   Pulse: 74   Temp:    Resp: 20   SpO2: 97%       Intake and Output Summary (Last 24 hours) at Date Time    Intake/Output Summary (Last 24 hours) at 07/11/14 1222  Last data filed at 07/11/14 0500   Gross per 24 hour   Intake   1000 ml   Output    870 ml   Net    130 ml       General appearance - alert, well appearing, and in no distress  Mental status - alert, oriented to person, place, and time  Eyes - pupils equal and reactive, extraocular eye movements intact  Neck - supple, no significant adenopathy  Chest - clear to auscultation, no wheezes, rales or rhonchi, symmetric air entry  Heart - normal rate, regular rhythm, normal S1, S2, no murmurs, rubs, clicks or gallops  Abdomen -  soft, nontender, nondistended, no masses or organomegaly  Neurological - alert, oriented, normal speech, no focal findings or movement disorder noted  Extremities - peripheral pulses normal, no pedal edema, no clubbing or cyanosis, right knee immobilizer    Labs:     Results     Procedure Component Value Units Date/Time    Basic Metabolic Panel [[id number]]  (Abnormal) Collected:  07/11/14 1105    Specimen Information:  Blood Updated:  07/11/14 1218     Glucose 103 (H) mg/dL      BUN 7 mg/dL      Creatinine 1.2 (H) mg/dL      CALCIUM 7.8 (L) mg/dL      Sodium 409 mEq/L      Potassium 3.3 (L) mEq/L      Chloride 108 mEq/L      CO2 25 mEq/L     GFR [[id number]] Collected:  07/11/14 1105     EGFR 52.8 Updated:  07/11/14 1218    CBC without differential [[id number]]  (Abnormal) Collected:  07/11/14 0500    Specimen Information:  Blood / Blood Updated:  07/11/14 0554     WBC 10.74 x10 3/uL      RBC 3.60 (L) x10 6/uL      Hgb 9.5 (L) g/dL      Hematocrit 57.8 (L) %      MCV 83.1 fL      MCH 26.4 (L) pg      MCHC 31.8 (L) g/dL      RDW 15 %       Platelets 242 x10 3/uL      MPV 10.7 fL      Nucleated RBC 0 /100 WBC     Urine culture [[id number]] Collected:  07/08/14 1640    Specimen Information:  Urine / Urine, Clean Catch Updated:  07/10/14 1638    Narrative:      ORDER#: [id number]                                    ORDERED BY: Tobi Bastos  SOURCE: Urine, Clean Catch                           COLLECTED:  07/08/14 16:40  ANTIBIOTICS AT COLL.:                                RECEIVED :  07/08/14 20:20  Culture Urine                              FINAL       07/10/14 16:38   +  07/10/14   1,000 - 9,000 CFU/ML of normal urogenital or skin microbiota including             2,000 CFU/ML yeast, no further work  07/10/14   >100,000 CFU/ML Vancomycin Resistant Enterococcus faecium               Vancomycin Resistant Enterococcus    07/09/14   10,000 - 30,000 CFU/ML Gram negative rod               Lactose fermenting gram negative rod, No further work,             Questionable significance due to low quantity.  _____________________________________________________________________________                                      VRE         ANTIBIOTICS                     MIC  INTRP      _____________________________________________________________________________  Ampicillin                      >8     R        Daptomycin                       4     S        Gentamicin High Level Resistan <=500   S        Levofloxacin                    >4     R        Linezolid                        2     S        Penicillin                      >8     R        Streptomycin High Level Resist >1000   R        Tetracycline                    >8     R        Vancomycin                      >16    R        _____________________________________________________________________________            S=SUSCEPTIBLE     I=INTERMEDIATE     R=RESISTANT                            N/S=NON-SUSCEPTIBLE  _____________________________________________________________________________                 Rads:   Radiological Procedure reviewed  Radiology Results (24 Hour)     ** No results found for the last 24 hours. **        Signed by: Elizbeth Squires, MD

## 2014-07-12 LAB — COMPREHENSIVE METABOLIC PANEL
ALT: 8 U/L (ref 0–55)
AST (SGOT): 17 U/L (ref 5–34)
Albumin/Globulin Ratio: 0.7 — ABNORMAL LOW (ref 0.9–2.2)
Albumin: 2.5 g/dL — ABNORMAL LOW (ref 3.5–5.0)
Alkaline Phosphatase: 62 U/L (ref 37–106)
BUN: 6 mg/dL — ABNORMAL LOW (ref 7–19)
Bilirubin, Total: 0.9 mg/dL (ref 0.2–1.2)
CO2: 21 mEq/L — ABNORMAL LOW (ref 22–29)
Calcium: 7.8 mg/dL — ABNORMAL LOW (ref 7.9–10.2)
Chloride: 113 mEq/L — ABNORMAL HIGH (ref 100–111)
Creatinine: 1.2 mg/dL — ABNORMAL HIGH (ref 0.6–1.0)
Globulin: 3.6 g/dL (ref 2.0–3.6)
Glucose: 90 mg/dL (ref 70–100)
Potassium: 3.6 mEq/L (ref 3.5–5.1)
Protein, Total: 6.1 g/dL (ref 6.0–8.3)
Sodium: 148 mEq/L — ABNORMAL HIGH (ref 136–145)

## 2014-07-12 LAB — CBC
Hematocrit: 28.9 % — ABNORMAL LOW (ref 37.0–47.0)
Hgb: 9.3 g/dL — ABNORMAL LOW (ref 12.0–16.0)
MCH: 26.7 pg — ABNORMAL LOW (ref 28.0–32.0)
MCHC: 32.2 g/dL (ref 32.0–36.0)
MCV: 83 fL (ref 80.0–100.0)
MPV: 10.7 fL (ref 9.4–12.3)
Nucleated RBC: 0 /100 WBC (ref 0–1)
Platelets: 276 10*3/uL (ref 140–400)
RBC: 3.48 10*6/uL — ABNORMAL LOW (ref 4.20–5.40)
RDW: 15 % (ref 12–15)
WBC: 10.49 10*3/uL (ref 3.50–10.80)

## 2014-07-12 LAB — GLUCOSE WHOLE BLOOD - POCT: Whole Blood Glucose POCT: 102 mg/dL — ABNORMAL HIGH (ref 70–100)

## 2014-07-12 LAB — GFR: EGFR: 52.8

## 2014-07-12 LAB — PREPARE RBC

## 2014-07-12 MED ORDER — DEXTROSE-SODIUM CHLORIDE 5-0.45 % IV SOLN
INTRAVENOUS | Status: AC
Start: 2014-07-12 — End: 2014-07-13

## 2014-07-12 NOTE — Progress Notes (Signed)
Pain tolerated being oob to chair ambulate with waker wt as instructed  >1hr with no noted or reported change in condition neurovascular wnl,  ace wrap and immobilizer  rle c/d/i and in place . She reports pain level tolerable, no new changes  noted . Plan of care continues

## 2014-07-12 NOTE — Progress Notes (Signed)
PROGRESS NOTE    Date Time: 07/12/2014 10:19 AM  Patient Name: [name], [name]      Assessment/ Plan:   1) Upper GI bleed due to Gastritis - cont to hold NSAIDs. Tolerating diet s/p EGD. Continue PPI and carafate.   2) Stable Chronic Anemia - monitor.   3) PJI, S/p TKA revision reimplantation 07/01/14 - on IV abx, was at home on amoxacillin  4) Hx Non-healing surgical wound of right knee, with wound dehiscence and drainage , s/p Revision soft tissue closure (10/2)  5)  Hypertension - BP elevated, continue home medications. Cont bisoprolol and clonidine, increase amlodipine to 10mg . On hydralazine PRN (change to PO).  6) COPD, stable   7) ESBL, MRSA hx -on isolation   8) Hypokalemia - replaced, K is WNL.  9? Creatinine again elevated at 1.2, Na has gone up to 148. Will hydrate , monitor.    Disposition - transfer to medical/ortho floor.       Subjective:   No evidence of GI bleed. Tolerating food. Now NPO for Right TKA revision for wound dehiscence.      Medications:     Current Facility-Administered Medications   Medication Dose Route Frequency   . amLODIPine  10 mg Oral Daily   . bisoprolol  5 mg Oral Daily   . chlorhexidine  10 mL Mouth/Throat BID   . cloNIDine  0.1 mg Oral Daily   . DAPTOmycin (CUBICIN) IVPB  6 mg/kg Intravenous Q24H SCH   . gabapentin  300 mg Oral Daily   . multivitamin  1 tablet Oral Daily   . mupirocin   Nasal BID   . pantoprazole  40 mg Oral QAM AC   . piperacillin-tazobactam  4.5 g Intravenous Q8H SCH   . potassium chloride  20 mEq Oral BID   . sucralfate  1 g Oral TID AC & HS   . tiotropium  18 mcg Inhalation QAM   . vitamin C  500 mg Oral BID       Review of Systems:   A comprehensive review of systems was: History obtained from the patient  General ROS: negative  Psychological ROS: negative  Respiratory ROS: no cough, shortness of breath, or wheezing  Cardiovascular ROS: no chest pain or dyspnea on exertion  Gastrointestinal ROS: positive for - abdominal pain  Genito-Urinary ROS: no dysuria,  trouble voiding, or hematuria  Musculoskeletal ROS: positive for - joint pain  Neurological ROS: no TIA or stroke symptoms  Dermatological ROS: negative    Physical Exam:     Filed Vitals:    07/12/14 0925   BP: 163/64   Pulse: 82   Temp:    Resp: 20   SpO2: 99%       Intake and Output Summary (Last 24 hours) at Date Time    Intake/Output Summary (Last 24 hours) at 07/12/14 1019  Last data filed at 07/12/14 0934   Gross per 24 hour   Intake 1079.17 ml   Output   2950 ml   Net -1870.83 ml       General appearance - alert, well appearing, and in no distress  Mental status - alert, oriented to person, place, and time  Eyes - pupils equal and reactive, extraocular eye movements intact  Neck - supple, no significant adenopathy  Chest - clear to auscultation, no wheezes, rales or rhonchi, symmetric air entry  Heart - normal rate, regular rhythm, normal S1, S2, no murmurs, rubs, clicks or gallops  Abdomen -  soft, nontender, nondistended, no masses or organomegaly  Neurological - alert, oriented, normal speech, no focal findings or movement disorder noted  Extremities - peripheral pulses normal, no pedal edema, no clubbing or cyanosis, right knee immobilizer    Labs:     Results     Procedure Component Value Units Date/Time    Comprehensive metabolic panel [[id number]]  (Abnormal) Collected:  07/12/14 0604    Specimen Information:  Blood Updated:  07/12/14 0825     Glucose 90 mg/dL      BUN 6 (L) mg/dL      Creatinine 1.2 (H) mg/dL      CALCIUM 7.8 (L) mg/dL      Sodium 409 (H) mEq/L      Potassium 3.6 mEq/L      Chloride 113 (H) mEq/L      CO2 21 (L) mEq/L      Protein, Total 6.1 g/dL      Albumin 2.5 (L) g/dL      AST (SGOT) 17 U/L      ALT 8 U/L      Alkaline Phosphatase 62 U/L      Bilirubin, Total 0.9 mg/dL      Globulin 3.6 g/dL      Albumin/Globulin Ratio 0.7 (L)     GFR [[id number]] Collected:  07/12/14 0604     EGFR 52.8 Updated:  07/12/14 0825    CBC without differential [[id number]]  (Abnormal) Collected:  07/12/14  0604    Specimen Information:  Blood / Blood Updated:  07/12/14 0722     WBC 10.49 x10 3/uL      RBC 3.48 (L) x10 6/uL      Hgb 9.3 (L) g/dL      Hematocrit 57.8 (L) %      MCV 83.0 fL      MCH 26.7 (L) pg      MCHC 32.2 g/dL      RDW 15 %      Platelets 276 x10 3/uL      MPV 10.7 fL      Nucleated RBC 0 /100 WBC     RBC OR HOLD Blood Product [[id number]] Collected:  07/08/14 0232     RBC Leukoreduced [id number]    transfused Updated:  07/12/14 0041    Narrative:      ?Special Requirements->No special requirements  ?Surgery/ Procedure (to be Performed)->right knee I+D  ?Surgery/Procedure Date:->07/10/14  ?Is the patient pregnant?->No  ?Has the patient been transfused w/i the last 3 months?->Yes    Packed Red Blood Cells - Product [[id number]] Collected:  07/08/14 0232     RBC Leukoreduced [id number]    released Updated:  07/12/14 0041    Narrative:      ?Special Requirements->No special requirements  ?Surgery/ Procedure (to be Performed)->right knee I+D  ?Surgery/Procedure Date:->07/10/14  ?Is the patient pregnant?->No  ?Has the patient been transfused w/i the last 3 months?->Yes    Basic Metabolic Panel [[id number]]  (Abnormal) Collected:  07/11/14 1105    Specimen Information:  Blood Updated:  07/11/14 1218     Glucose 103 (H) mg/dL      BUN 7 mg/dL      Creatinine 1.2 (H) mg/dL      CALCIUM 7.8 (L) mg/dL      Sodium 660 mEq/L      Potassium 3.3 (L) mEq/L      Chloride 108 mEq/L      CO2 25 mEq/L     GFR [[id number]] Collected:  07/11/14 1105  EGFR 52.8 Updated:  07/11/14 1218              Rads:   Radiological Procedure reviewed  Radiology Results (24 Hour)     ** No results found for the last 24 hours. **        Signed by: Elizbeth Squires, MD

## 2014-07-12 NOTE — Progress Notes (Signed)
Patient alert x3 post foley d'/c patient noted with large amount of urine/loose stool . Plan of care continues

## 2014-07-12 NOTE — Plan of Care (Signed)
Problem: Safety  Goal: Patient will be free from injury during hospitalization  Outcome: Progressing  Safety measures enforced and maintained siderails up x2 call bell within reach rounding by staff    Problem: Pain  Goal: Patient's pain/discomfort is manageable  Outcome: Progressing  Patient RLE neurovascular wnl toes warm pink  cap efill < 3 sec she reports pain is managed with ordered pain medication plan of care continues

## 2014-07-12 NOTE — Progress Notes (Signed)
INFECTIOUS DISEASES PROGRESS NOTE    Date Time: 07/12/2014 3:33 PM  Patient Name: SheilaSheila [name]      Assessment and Recommendations:     Patient Active Problem List   Diagnosis   . Total knee replacement status   . Low back pain   . Chronic obstructive pulmonary disease   . Hypertension   . Debility following multiple R knee revisions   . Non-healing surgical wound- right knee    . Fever   . Leukocytosis   . Anemia   . Hypokalemia   . Chronic infection of prosthetic knee   . COPD (chronic obstructive pulmonary disease)   . HTN (hypertension)   . Infection   . Osteoarthritis   . GI bleed     78 yo female admitted with melena.  egd 10/1  With gastritis, superficial ulceration.    H/h has been stable, and no signs of active  Bleeding.     HX COMPLICATED RIGHT KNEE  PJI and soft tissue infection   - proteus pji initially  -group c strep/esbl soft tissue infection associated with gastro flap   -gbs bacteremia 6/15 - source unclear - tx'd with iv thru 04/10/14    S/p reimplant right knee on 07/01/14.  OR cxs   Negative.  Opted to continue on amoxicillin 875 bid   As suppression indefinitely given numerous infections/  Problems       devt small superficial dehiscence over right  Knee wound - s/p plastics repair - using right medial  Calf Fasciocutaneous flap  07/10/14    Hx nares mrsa colonization/ throat colonized now 9/30    Hx c. Difficile.  - maintain vigilance -     vre isolated from urine -  Asymptomatic colonization     Will opt to cover with daptomycin (instead of vanco) and zosyn  In perioperative period.    WHEN READY FOR DISCHARGE WILL RESUME AMOXICILLIN 875 PO  BID AS 'PROPHYLACTIC' ABX TO PREVENT RECURRENT PJI    Subjective:       Antibiotics:   Zosyn  periop  vanco  periop  ---> 10/2change to  daptomycin to cover mrsa/and vre     POD 2    Medications:     Current Facility-Administered Medications   Medication Dose Route Frequency   . amLODIPine  10 mg Oral Daily   . bisoprolol  5 mg Oral Daily   . chlorhexidine   10 mL Mouth/Throat BID   . cloNIDine  0.1 mg Oral Daily   . DAPTOmycin (CUBICIN) IVPB  6 mg/kg Intravenous Q24H SCH   . gabapentin  300 mg Oral Daily   . multivitamin  1 tablet Oral Daily   . mupirocin   Nasal BID   . pantoprazole  40 mg Oral QAM AC   . piperacillin-tazobactam  4.5 g Intravenous Q8H SCH   . potassium chloride  20 mEq Oral BID   . sucralfate  1 g Oral TID AC & HS   . tiotropium  18 mcg Inhalation QAM   . vitamin C  500 mg Oral BID       Central Access/Endovascular Hardware:     Active PICC Line / CVC Line / PIV Line / Drain / Airway / Intraosseous Line / Epidural Line / ART Line / Line / Wound / Pressure Ulcer / NG/OG Tube     Name:   Placement date:   Placement time:   Site:   Days:    PICC Single Lumen  03/24/14 Right Basilic  03/24/14   1518   Basilic   107    Peripheral IV 07/08/14 Right Antecubital  07/08/14   0243   Antecubital   1          Review of Systems:   Patient is without fevers, chills, nausea, vomiting,, abdominal pain, cough, dyspnea, headache, rash.  HAD 2 SOFT BM'S   NO MELENA     Physical Exam:     Filed Vitals:    07/12/14 0815 07/12/14 0845 07/12/14 0925 07/12/14 0925   BP: 183/84  100/56 163/64   Pulse: 74  78 82   Temp: 97.7 F (36.5 C)      TempSrc: Oral      Resp: 18  20 20    Height:       Weight:       SpO2: 97% 98% 100% 99%       Temp (24hrs), Avg:98 F (36.7 C), Min:97.7 F (36.5 C), Max:98.2 F (36.8 C)        In good spirits  Sclera anicteric, conjunctivae clear  Oral pharynx s erythema, exudate, or other significant lesions  No Thrush  Neck supple, s lymphadenopathy  Lungs Clear, bibas rales   Heart Regular rate and rhythm s murmurs, rubs, or gallops  Abdomen Soft, non tender, non distended, normal bowel sounds  Back No Costovertebral angle tenderness, no point tenderness along the spine  Extremities No Cyanosis, no clubbing,   Trace edema right leg.    Surgical dressing cdi on right leg, brace not rubbing on knee    Palpable pedal pulses  Neurologic exam  Awake,  alert, oriented post egd  Skin without rash     Labs:     Results     Procedure Component Value Units Date/Time    Comprehensive metabolic panel [[id number]]  (Abnormal) Collected:  07/12/14 0604    Specimen Information:  Blood Updated:  07/12/14 0825     Glucose 90 mg/dL      BUN 6 ([name]) mg/dL      Creatinine 1.2 (H) mg/dL      CALCIUM 7.8 ([name]) mg/dL      Sodium 409 (H) mEq/[name]      Potassium 3.6 mEq/[name]      Chloride 113 (H) mEq/[name]      CO2 21 ([name]) mEq/[name]      Protein, Total 6.1 g/dL      Albumin 2.5 ([name]) g/dL      AST (SGOT) 17 U/[name]      ALT 8 U/[name]      Alkaline Phosphatase 62 U/[name]      Bilirubin, Total 0.9 mg/dL      Globulin 3.6 g/dL      Albumin/Globulin Ratio 0.7 ([name])     GFR [[id number]] Collected:  07/12/14 0604     EGFR 52.8 Updated:  07/12/14 0825    CBC without differential [[id number]]  (Abnormal) Collected:  07/12/14 0604    Specimen Information:  Blood / Blood Updated:  07/12/14 0722     WBC 10.49 x10 3/uL      RBC 3.48 ([name]) x10 6/uL      Hgb 9.3 ([name]) g/dL      Hematocrit 57.8 ([name]) %      MCV 83.0 fL      MCH 26.7 ([name]) pg      MCHC 32.2 g/dL      RDW 15 %      Platelets 276 x10 3/uL  MPV 10.7 fL      Nucleated RBC 0 /100 WBC     RBC OR HOLD Blood Product [[id number]] Collected:  07/08/14 0232     RBC Leukoreduced [id number]    transfused Updated:  07/12/14 0041    Narrative:      ?Special Requirements->No special requirements  ?Surgery/ Procedure (to be Performed)->right knee I+D  ?Surgery/Procedure Date:->07/10/14  ?Is the patient pregnant?->No  ?Has the patient been transfused w/i the last 3 months?->Yes    Packed Red Blood Cells - Product [[id number]] Collected:  07/08/14 0232     RBC Leukoreduced [id number]    released Updated:  07/12/14 0041    Narrative:      ?Special Requirements->No special requirements  ?Surgery/ Procedure (to be Performed)->right knee I+D  ?Surgery/Procedure Date:->07/10/14  ?Is the patient pregnant?->No  ?Has the patient been transfused w/i the last 3 months?->Yes          Recent Labs  Lab  07/12/14  0604   WBC 10.49   HEMOGLOBIN 9.3*   HEMATOCRIT 28.9*   PLATELETS 276        Recent Labs  Lab 07/12/14  0604   SODIUM 148*   POTASSIUM 3.6   CHLORIDE 113*   CO2 21*   BUN 6*   CREATININE 1.2*   CALCIUM 7.8*   ALBUMIN 2.5*   PROTEIN, TOTAL 6.1   BILIRUBIN, TOTAL 0.9   ALKALINE PHOSPHATASE 62   ALT 8   AST (SGOT) 17   GLUCOSE 90       Recent Labs  Lab 07/10/14  0420   PT INR 1.2*          Rads:     Radiology Results (24 Hour)     ** No results found for the last 24 hours. **          Signed by: Ray Church, MD

## 2014-07-12 NOTE — Plan of Care (Signed)
Problem: Health Promotion  Goal: Knowledge - disease process  Extent of understanding conveyed about a specific disease process.   Outcome: Progressing  RLE covered with ACE wrap, immobilizer in place. Elevated on a pillow. Able to wiggle toes, good cap refill and positive pulses.    Problem: Safety  Goal: Patient will be free from injury during hospitalization  Outcome: Progressing  Encouraged call light use for assistance. Rounded hourly and made sure pt is safe.    Problem: Pain  Goal: Patient's pain/discomfort is manageable  Outcome: Progressing  Tramadol given for c/o RLE pain, comfortably resting in bed at the moment.    Problem: GI Bleed  Goal: Elimination patterns are normal or improving  Pt had a dark brown loose BM x1.

## 2014-07-12 NOTE — Progress Notes (Signed)
[facility], [facility]  Edmonia Caprio, M.D.  [phone]    Progress Note    Date Time: 07/12/2014 2:16 PM  Patient Name: [name]    Assessment:       Plan:   GI bleed: No evidence of active bleeding.    Subjective:   Patient is resting in bed in NAD    Medications:     Current Facility-Administered Medications   Medication Dose Route Frequency   . amLODIPine  10 mg Oral Daily   . bisoprolol  5 mg Oral Daily   . chlorhexidine  10 mL Mouth/Throat BID   . cloNIDine  0.1 mg Oral Daily   . DAPTOmycin (CUBICIN) IVPB  6 mg/kg Intravenous Q24H SCH   . gabapentin  300 mg Oral Daily   . multivitamin  1 tablet Oral Daily   . mupirocin   Nasal BID   . pantoprazole  40 mg Oral QAM AC   . piperacillin-tazobactam  4.5 g Intravenous Q8H SCH   . potassium chloride  20 mEq Oral BID   . sucralfate  1 g Oral TID AC & HS   . tiotropium  18 mcg Inhalation QAM   . vitamin C  500 mg Oral BID         Physical Exam:     Filed Vitals:    07/12/14 0925   BP: 163/64   Pulse: 82   Temp:    Resp: 20   SpO2: 99%       General appearance - alert, in no distress,  oriented to person, place, and time  Eyes - EOMI, sclera anicteric  Chest - clear to auscultation, no wheezes, rales or rhonchi, normal resp. effort  Heart - normal rate, regular rhythm, S1 and S2 normal  Abdomen - soft, , nondistended, no masses or organomegaly, no guarding, no rebound, no peritoneal signs,NT  Extremities - no pedal edema noted, clubbing, or cyanosis    Labs:     Recent Labs  Lab 07/12/14  0604 07/11/14  0500 07/10/14  0420   WBC 10.49 10.74 9.84   HEMOGLOBIN 9.3* 9.5* 9.0*   HEMATOCRIT 28.9* 29.9* 27.8*   PLATELETS 276 242 219       Radiology:   Radiological Procedure reviewed.      Signed by: Edmonia Caprio, MD                                                                          CC:  Edmonia Caprio, MD

## 2014-07-12 NOTE — Progress Notes (Signed)
PROGRESS NOTE    Date Time: 07/12/2014 7:47 AM  Patient Name: [name], [name]      Assessment:    s/p R knee soft tissue rotational flap on  10/2, doing well    Plan:   -IV abx per ID through the weekend  -resume PT: 50% WB RLE in HKB locked in extension, Dr Campbell Lerner requested medial side of HKB be removed unitil Monday to prevent pressure on wound  -continue SCDs while ASA being held for GI bleed  -plan for wound check on Monday and D/C home if looks ok  -plan for 2 weeks in extension in [facility]  -Dr Campbell Lerner would like to see patient in the wound clinic next Friday (10/9)    Subjective:   Pain controlled, tolerating diet    Medications:     Current Facility-Administered Medications   Medication Dose Route Frequency   . amLODIPine  10 mg Oral Daily   . bisoprolol  5 mg Oral Daily   . chlorhexidine  10 mL Mouth/Throat BID   . cloNIDine  0.1 mg Oral Daily   . DAPTOmycin (CUBICIN) IVPB  6 mg/kg Intravenous Q24H SCH   . gabapentin  300 mg Oral Daily   . multivitamin  1 tablet Oral Daily   . mupirocin   Nasal BID   . pantoprazole  40 mg Oral QAM AC   . piperacillin-tazobactam  4.5 g Intravenous Q8H SCH   . potassium chloride  20 mEq Oral BID   . sucralfate  1 g Oral TID AC & HS   . tiotropium  18 mcg Inhalation QAM   . vitamin C  500 mg Oral BID       Physical Exam:     Filed Vitals:    07/11/14 1945   BP: 107/62   Pulse: 73   Temp: 98.2 F (36.8 C)   Resp: 18   SpO2: 98%       Intake and Output Summary (Last 24 hours) at Date Time    Intake/Output Summary (Last 24 hours) at 07/12/14 0747  Last data filed at 07/12/14 0707   Gross per 24 hour   Intake 1079.17 ml   Output   2950 ml   Net -1870.83 ml       General appearance - no acute distress  Musculoskeletal -  Dressing C/D/I, HKB in place  Fires quad/TA/EHL/GSC  SILT SP/DP/TN  WWP distally  Calves soft, nontender bilateral    Labs:     Results     Procedure Component Value Units Date/Time    CBC without differential [[id number]]  (Abnormal) Collected:  07/12/14 0604    Specimen  Information:  Blood / Blood Updated:  07/12/14 0722     WBC 10.49 x10 3/uL      RBC 3.48 (L) x10 6/uL      Hgb 9.3 (L) g/dL      Hematocrit 13.0 (L) %      MCV 83.0 fL      MCH 26.7 (L) pg      MCHC 32.2 g/dL      RDW 15 %      Platelets 276 x10 3/uL      MPV 10.7 fL      Nucleated RBC 0 /100 WBC     Comprehensive metabolic panel [[id number]] Collected:  07/12/14 0604    Specimen Information:  Blood Updated:  07/12/14 0712    RBC OR HOLD Blood Product [[id number]] Collected:  07/08/14 0232  RBC Leukoreduced [id number]    transfused Updated:  07/12/14 0041    Narrative:      ?Special Requirements->No special requirements  ?Surgery/ Procedure (to be Performed)->right knee I+D  ?Surgery/Procedure Date:->07/10/14  ?Is the patient pregnant?->No  ?Has the patient been transfused w/i the last 3 months?->Yes    Packed Red Blood Cells - Product [[id number]] Collected:  07/08/14 0232     RBC Leukoreduced [id number]    released Updated:  07/12/14 0041    Narrative:      ?Special Requirements->No special requirements  ?Surgery/ Procedure (to be Performed)->right knee I+D  ?Surgery/Procedure Date:->07/10/14  ?Is the patient pregnant?->No  ?Has the patient been transfused w/i the last 3 months?->Yes    Basic Metabolic Panel [[id number]]  (Abnormal) Collected:  07/11/14 1105    Specimen Information:  Blood Updated:  07/11/14 1218     Glucose 103 (H) mg/dL      BUN 7 mg/dL      Creatinine 1.2 (H) mg/dL      CALCIUM 7.8 (L) mg/dL      Sodium 324 mEq/L      Potassium 3.3 (L) mEq/L      Chloride 108 mEq/L      CO2 25 mEq/L     GFR [[id number]] Collected:  07/11/14 1105     EGFR 52.8 Updated:  07/11/14 1218          Physical Therapy:   Pain Score: 5-moderate pain (07/11/14 2246)  Weight Bearing Percent: 10 (07/10/14 1000)  Ambulation Distance (Feet): 5 Feet (07/09/14 0800)  Right Lower Extremity ROM: unable to assess (07/09/14 0800)  Left Lower Extremity ROM: within functional limits (07/09/14 0800)              Rads:   Radiological  Procedure reviewed.    Signed by: Mathis Fare

## 2014-07-13 LAB — CBC
Hematocrit: 28.9 % — ABNORMAL LOW (ref 37.0–47.0)
Hgb: 9.2 g/dL — ABNORMAL LOW (ref 12.0–16.0)
MCH: 26.4 pg — ABNORMAL LOW (ref 28.0–32.0)
MCHC: 31.8 g/dL — ABNORMAL LOW (ref 32.0–36.0)
MCV: 83 fL (ref 80.0–100.0)
MPV: 10.1 fL (ref 9.4–12.3)
Nucleated RBC: 0 /100 WBC (ref 0–1)
Platelets: 276 10*3/uL (ref 140–400)
RBC: 3.48 10*6/uL — ABNORMAL LOW (ref 4.20–5.40)
RDW: 16 % — ABNORMAL HIGH (ref 12–15)
WBC: 9.34 10*3/uL (ref 3.50–10.80)

## 2014-07-13 LAB — COMPREHENSIVE METABOLIC PANEL
ALT: 8 U/L (ref 0–55)
AST (SGOT): 21 U/L (ref 5–34)
Albumin/Globulin Ratio: 0.7 — ABNORMAL LOW (ref 0.9–2.2)
Albumin: 2.6 g/dL — ABNORMAL LOW (ref 3.5–5.0)
Alkaline Phosphatase: 61 U/L (ref 37–106)
BUN: 6 mg/dL — ABNORMAL LOW (ref 7–19)
Bilirubin, Total: 0.9 mg/dL (ref 0.2–1.2)
CO2: 22 mEq/L (ref 22–29)
Calcium: 8.2 mg/dL (ref 7.9–10.2)
Chloride: 113 mEq/L — ABNORMAL HIGH (ref 100–111)
Creatinine: 1.1 mg/dL — ABNORMAL HIGH (ref 0.6–1.0)
Globulin: 3.7 g/dL — ABNORMAL HIGH (ref 2.0–3.6)
Glucose: 108 mg/dL — ABNORMAL HIGH (ref 70–100)
Potassium: 3.6 mEq/L (ref 3.5–5.1)
Protein, Total: 6.3 g/dL (ref 6.0–8.3)
Sodium: 148 mEq/L — ABNORMAL HIGH (ref 136–145)

## 2014-07-13 LAB — GFR: EGFR: 58.4

## 2014-07-13 MED ORDER — INFLUENZA VAC SPLIT HIGH-DOSE 0.5 ML IM SUSY
0.5000 mL | PREFILLED_SYRINGE | Freq: Once | INTRAMUSCULAR | Status: AC
Start: 2014-07-13 — End: 2014-07-13
  Administered 2014-07-13: 0.5 mL via INTRAMUSCULAR
  Filled 2014-07-13: qty 0.5

## 2014-07-13 MED ORDER — PANTOPRAZOLE SODIUM 40 MG PO TBEC
40.0000 mg | DELAYED_RELEASE_TABLET | Freq: Every morning | ORAL | Status: AC
Start: 2014-07-13 — End: 2014-08-12

## 2014-07-13 MED ORDER — AMLODIPINE BESYLATE 10 MG PO TABS
10.0000 mg | ORAL_TABLET | Freq: Every day | ORAL | Status: AC
Start: 2014-07-13 — End: 2014-08-12

## 2014-07-13 MED ORDER — AMOXICILLIN 250 MG PO CAPS
750.0000 mg | ORAL_CAPSULE | Freq: Two times a day (BID) | ORAL | Status: DC
Start: 2014-07-13 — End: 2014-07-13
  Filled 2014-07-13: qty 3

## 2014-07-13 MED ORDER — MUPIROCIN CALCIUM 2 % NA OINT
TOPICAL_OINTMENT | Freq: Two times a day (BID) | NASAL | Status: AC
Start: 2014-07-13 — End: 2014-07-17

## 2014-07-13 MED ORDER — SUCRALFATE 1 GM/10ML PO SUSP
1.0000 g | Freq: Four times a day (QID) | ORAL | Status: AC
Start: 2014-07-13 — End: 2014-07-27

## 2014-07-13 NOTE — Progress Notes (Addendum)
PROGRESS NOTE    Date Time: 07/13/2014 6:51 AM  Patient Name: [name], [name]      Assessment:   Re-admit 9/30 s/p R TKA revision reimplantation on 9/23 for UGI bleed with superficial gastric ulcer  s/p R knee soft tissue rotational flap on  10/2    Plan:   PT: 50% WB RLE in HKB locked in extension, Dr Campbell Lerner requested medial side of HKB be removed to prevent pressure on wound    DVT proph: medicine holding ASA for GI bleed, continue SCDs    High risk surgical incision: nutrition support ordered    Abx: zosyn and dapto per ID for VRE UTI and hx of R TKA infection    Hyponatremia: appreciate medical management    Dispo: discharge home when medically clear and clears PT, Abx per ID,   F/U:  -Dr Campbell Lerner would like to see patient in the wound clinic this Friday (10/9)   -Dr Ahmed Prima in ortho clinic 2 weeks    -should have pain Rx's from previous admission    Subjective:   Pain controlled, tolerating diet    Medications:     Current Facility-Administered Medications   Medication Dose Route Frequency   . amLODIPine  10 mg Oral Daily   . bisoprolol  5 mg Oral Daily   . chlorhexidine  10 mL Mouth/Throat BID   . cloNIDine  0.1 mg Oral Daily   . DAPTOmycin (CUBICIN) IVPB  6 mg/kg Intravenous Q24H SCH   . gabapentin  300 mg Oral Daily   . influenza  0.5 mL Intramuscular Once   . multivitamin  1 tablet Oral Daily   . mupirocin   Nasal BID   . pantoprazole  40 mg Oral QAM AC   . piperacillin-tazobactam  4.5 g Intravenous Q8H SCH   . potassium chloride  20 mEq Oral BID   . sucralfate  1 g Oral TID AC & HS   . tiotropium  18 mcg Inhalation QAM   . vitamin C  500 mg Oral BID       Physical Exam:     Filed Vitals:    07/12/14 2109   BP: 141/82   Pulse: 77   Temp: 97.7 F (36.5 C)   Resp: 20   SpO2: 99%       Intake and Output Summary (Last 24 hours) at Date Time    Intake/Output Summary (Last 24 hours) at 07/13/14 0651  Last data filed at 07/12/14 1900   Gross per 24 hour   Intake    550 ml   Output   1800 ml   Net  -1250 ml       General  appearance - no acute distress  Musculoskeletal -  Incision C/D/I, dressings changed 10/5  Fires quad/TA/EHL/GSC  SILT SP/DP/TN  WWP distally  Calves soft, nontender bilateral    Labs:     Results     Procedure Component Value Units Date/Time    Glucose Whole Blood - POCT [[id number]]  (Abnormal) Collected:  07/12/14 2111     POCT - Glucose Whole blood 102 (H) mg/dL Updated:  40/98/11 9147    Comprehensive metabolic panel [[id number]]  (Abnormal) Collected:  07/12/14 0604    Specimen Information:  Blood Updated:  07/12/14 0825     Glucose 90 mg/dL      BUN 6 (L) mg/dL      Creatinine 1.2 (H) mg/dL      CALCIUM 7.8 (L) mg/dL  Sodium 148 (H) mEq/L      Potassium 3.6 mEq/L      Chloride 113 (H) mEq/L      CO2 21 (L) mEq/L      Protein, Total 6.1 g/dL      Albumin 2.5 (L) g/dL      AST (SGOT) 17 U/L      ALT 8 U/L      Alkaline Phosphatase 62 U/L      Bilirubin, Total 0.9 mg/dL      Globulin 3.6 g/dL      Albumin/Globulin Ratio 0.7 (L)     GFR [[id number]] Collected:  07/12/14 0604     EGFR 52.8 Updated:  07/12/14 0825    CBC without differential [[id number]]  (Abnormal) Collected:  07/12/14 0604    Specimen Information:  Blood / Blood Updated:  07/12/14 0722     WBC 10.49 x10 3/uL      RBC 3.48 (L) x10 6/uL      Hgb 9.3 (L) g/dL      Hematocrit 86.5 (L) %      MCV 83.0 fL      MCH 26.7 (L) pg      MCHC 32.2 g/dL      RDW 15 %      Platelets 276 x10 3/uL      MPV 10.7 fL      Nucleated RBC 0 /100 WBC           Physical Therapy:   Pain Score: 3-mild pain (07/12/14 2230)  Weight Bearing Percent: 10 (07/10/14 1000)  Ambulation Distance (Feet): 5 Feet (07/09/14 0800)  Right Lower Extremity ROM: unable to assess (07/09/14 0800)  Left Lower Extremity ROM: within functional limits (07/09/14 0800)              Rads:   Radiological Procedure reviewed.    Signed by: Collene Schlichter III

## 2014-07-13 NOTE — Progress Notes (Addendum)
Infectious Diseases  Progress Note     Impression:   38 yof a/w GIB and s/p flap over right knee 07/10/14 doing well. See previous notes. Plan is long term suppression.    Recommendations:   Amox 875 BID x 30 days and then q day forever.  B/R/A D/W Pt.  Local care per Surgery  Pt to call for f/u with Dr. Dan Humphreys In a month    Subjective:   Better    Review of Systems  Patient without nausea, vomiting, diarrhea, rash, cough, shortness of breath, abdominal or chest pain.      Objective:   BP 102/70 mmHg  Pulse 78  Temp(Src) 95.4 F (35.2 C) (Oral)  Resp 18  Ht 1.575 m (5\' 2" )  Wt 98 kg (216 lb 0.8 oz)  BMI 39.51 kg/m2  SpO2 99%    Temp (24hrs), Avg:96.6 F (35.9 C), Min:95.4 F (35.2 C), Max:97.7 F (36.5 C)      General: awake, alert, oriented x 3; no acute distress.  Cardiovascular: regular rate and rhythm, no murmurs, rubs or gallops  Lungs: clear to auscultation bilaterally, without wheezing, rhonchi, or rales  Abdomen: soft, non-tender, non-distended; no palpable masses, no hepatosplenomegaly, normoactive bowel sounds, no rebound or guarding  Extremities: no clubbing, cyanosis, or edema  Other: right leg in brace. Incision clean as per ortho note today        Labs:    Recent Labs  Lab 07/13/14  0652 07/12/14  0604 07/11/14  0500   WBC 9.34 10.49 10.74   HEMOGLOBIN 9.2* 9.3* 9.5*   HEMATOCRIT 28.9* 28.9* 29.9*   PLATELETS 276 276 242       Recent Labs  Lab 07/13/14  0652 07/12/14  0604 07/11/14  1105  07/08/14  0232   SODIUM 148* 148* 144  < > 143   POTASSIUM 3.6 3.6 3.3*  < > 3.8   CHLORIDE 113* 113* 108  < > 107   CO2 22 21* 25  < > 24   BUN 6* 6* 7  < > 9   CREATININE 1.1* 1.2* 1.2*  < > 0.7   CALCIUM 8.2 7.8* 7.8*  < > 8.8   ALBUMIN 2.6* 2.5*  --   --  2.8*   PROTEIN, TOTAL 6.3 6.1  --   --  6.6   BILIRUBIN, TOTAL 0.9 0.9  --   --  0.9   ALKALINE PHOSPHATASE 61 62  --   --  77   ALT 8 8  --   --  11   AST (SGOT) 21 17  --   --  16   GLUCOSE 108* 90 103*  < > 93   < > = values in this interval not  displayed.

## 2014-07-13 NOTE — Plan of Care (Signed)
Problem: Safety  Goal: Patient will be free from injury during hospitalization  Outcome: Progressing  Intervention: Use appropriate transfer methods  Patient assisted with use of bedpan. Call light within reach.       Problem: Pain  Goal: Patient's pain/discomfort is manageable  Outcome: Progressing  Intervention: Offer non-pharmocological pain management interventions  Tramadol PO administered for pain relief on right lower extremity. Knee immobilizer intact.       Problem: Tissue integrity  Goal: Damaged tissue is healing and protected  Intervention: Avoid shearing injuries (lift don't pull when repositioning).  Patient reported frequent loose stools. Brown BM x 2. Perineal area excoriated. Area cleansed and skin barrier applied.

## 2014-07-13 NOTE — Discharge Instructions (Signed)
CASE MANAGEMENT DISCHARGE SUMMARY  Right Total Knee Replacement  06/29/14     Continue individual exercise program at home   Home Health Physical Therapy 2 times per wk Provider:[facility]  ph: [phone]  Monitor incision, if drainage notify Dr Campbell Lerner of drainage [phone]  Assess pain level every 2 to 4 hours and medicate accordingly. Contact MD for Rx refills Ice and elevate to reduce swelling & discomfort.      No driving until OK with MD   50% weight bearing using device.   Use walker or two crutches until follow up appt with MD.  Call [facility], [phone], for a follow-up appointment in two weeks.   Case Manager, [facility] [phone]              [facility] Hip and Knee Replacement Discharge Instructions    SWELLING  It is normal to have some swelling in your lower legs after surgery.  Walking every hour and doing your exercises will help strengthen your muscles and resolve the swelling.  If you have swelling, we recommend that you lie down every two hours, elevate your legs with pillows, and apply ice for 15 minutes.  If the swelling does not go away overnight, please call your doctors office.    SHOWERS  You are permitted to shower at home as long as your incision is clean and intact.  If you have a clear plastic dressing, leave that in place for 10 days and just let the water run over the dressing. If you have a cloth or gauze dressing, remove it before the shower and replace it after the shower.  Do not soak in a tub or swim until you doctor says it's okay.    ACTIVITY  You will be using an assistive device ( walker, crutches or cane). Your physical therapist will help you with this. Most patients are able to get in and out of bed, use the restroom and go up and down stairs when they go home. It's a good idea to have someone with you in case you need help for the first week. We recommend that you get up and move around every 1-1.5 hours while you are awake.  This will help with stiffness and reduce the chance of blood clots.    PAIN  You may continue to have pain or soreness for several weeks after surgery.  Gradually taper the frequency and amount of narcotic pain medication you are taking based on the amount of pain or soreness you have.  Extra strength acetaminophen can be used but should not exceed 4000mg  in one day.  Apply ice to the incision for 15 minutes after activity to help ease soreness.    MEDICATIONS  Most patients are discharged with several prescriptions. There are 2 main categories, pain pills and pills to help prevent blood clots.  Pain pills - take these if you need them. When taking narcotic pain medications, be sure to drink plenty of water and use stool softeners, such as Pericolace or Colace, as these pain medications can be constipating.  Blood Thinners - could be aspirin, coumadin, lovenox or mechanical pumps. Make sure you take the medicine properly or use the pumps as prescribed.  Vitron C - this is an over the counter medication that will help to build back your blood count after surgery. We recommend this for most patients.    CALLING THE OFFICE  Call the doctor if you have any additional questions or  concerns. After hours one of the fellows that helped with the surgery will return your call. During working hours the staff can answer simple questions. If you have concerns not addressed to your satisfaction by the staff then ask for your doctor's nurse to return your call.    Mupirocin Calcium Nasal ointment  What is this medicine?  MUPIROCIN CALCIUM (myoo PEER oh sin KAL see um) is an antibiotic. It is used inside the nose to treat infections that are caused by certain bacteria. This helps prevent the spread of infection to patients and health care workers during outbreaks at institutions.  This medicine may be used for other purposes; ask your health care provider or pharmacist if you have questions.  What should I tell my health care  provider before I take this medicine?  They need to know if you have any of these conditions:   an unusual or allergic reaction to mupirocin, other medicines, foods, dyes, or preservatives   pregnant or trying to get pregnant   breast-feeding  How should I use this medicine?  This medicine is only for use inside the nose. Follow the directions on the prescription label. Wash your hands before and after use. Squeeze half the contents of a single-use tube into one nostril, then squeeze the other half into the other nostril. Press the sides of your nose together and gently massage after application to spread the ointment throughout the nostrils. Do not use your medicine more often than directed. Finish the full course of medicine prescribed by your doctor or health care professional even if you think your condition is better.  Talk to your pediatrician regarding the use of this medicine in children. Special care may be needed.  Overdosage: If you think you have taken too much of this medicine contact a poison control center or emergency room at once.  NOTE: This medicine is only for you. Do not share this medicine with others.  What if I miss a dose?  If you miss a dose, take it as soon as you can. If it is almost time for your next dose, take only that dose. Do not take double or extra doses.  What may interact with this medicine?  Interactions are not expected. Do not use any other nose products without telling your doctor or health care professional.  This list may not describe all possible interactions. Give your health care provider a list of all the medicines, herbs, non-prescription drugs, or dietary supplements you use. Also tell them if you smoke, drink alcohol, or use illegal drugs. Some items may interact with your medicine.  What should I watch for while using this medicine?  If your nose is severely irritated, burning or stinging from use of this medicine, stop using it and contact your doctor or health  care professional.  Do not get this medicine in your eyes. If you do, rinse out with plenty of cool tap water.  What side effects may I notice from receiving this medicine?  Side effects that you should report to your doctor or health care professional as soon as possible:   severe irritation, burning, stinging, or pain  Side effects that usually do not require medical attention (report to your doctor or health care professional if they continue or are bothersome):   altered taste   cough   headache   skin itching   sore throat   stuffy or runny nose  This list may not describe all possible side effects.  Call your doctor for medical advice about side effects. You may report side effects to FDA at 1-800-FDA-1088.  Where should I keep my medicine?  Keep out of the reach of children.  Store at room temperature between 15 and 30 degrees C (59 and 86 degrees F). Do not refrigerate. One tube of ointment is for single use in both nostrils. Throw away after use.  NOTE:This sheet is a summary. It may not cover all possible information. If you have questions about this medicine, talk to your doctor, pharmacist, or health care provider. Copyright 2015 Gold Standard      ``````````````````````      Sucralfate Oral suspension  What is this medicine?  SUCRALFATE (SOO kral fate) helps to treat ulcers of the intestine.  This medicine may be used for other purposes; ask your health care provider or pharmacist if you have questions.  What should I tell my health care provider before I take this medicine?  They need to know if you have any of these conditions:   kidney disease   an unusual or allergic reaction to sucralfate, other medicines, foods, dyes, or preservatives   pregnant or trying to get pregnant   breast-feeding  How should I use this medicine?  Take this medicine by mouth with a glass of water. Follow the directions on the prescription label. Shake well before using. Use a specially marked spoon or container  to measure your medicine. Ask your pharmacist if you do not have one. Household spoons are not accurate. This medicine works best if you take it on an empty stomach, 1 hour before meals. Take your doses at regular intervals. Do not take your medicine more often than directed. Do not stop taking except on your doctor's advice.  Talk to your pediatrician regarding the use of this medicine in children. Special care may be needed.  Overdosage: If you think you have taken too much of this medicine contact a poison control center or emergency room at once.  NOTE: This medicine is only for you. Do not share this medicine with others.  What if I miss a dose?  If you miss a dose, take it as soon as you can. If it is almost time for your next dose, take only that dose. Do not take double or extra doses.  What may interact with this medicine?   antacid   cimetidine   digoxin   ketoconazole   phenytoin   quinidine   ranitidine   some antibiotics like ciprofloxacin, norfloxacin, and ofloxacin   theophylline   thyroid hormones   warfarin  This list may not describe all possible interactions. Give your health care provider a list of all the medicines, herbs, non-prescription drugs, or dietary supplements you use. Also tell them if you smoke, drink alcohol, or use illegal drugs. Some items may interact with your medicine.  What should I watch for while using this medicine?  Visit your doctor or health care professional for regular check ups. Let your doctor know if your symptoms do not improve or if you feel worse.  Antacids should not be taken within one half hour before or after this medicine.  What side effects may I notice from receiving this medicine?  Side effects that you should report to your doctor or health care professional as soon as possible:   allergic reactions like skin rash, itching or hives, swelling of the face, lips, or tongue   difficulty breathing  Side effects that usually do  not require medical  attention (report to your doctor or health care professional if they continue or are bothersome):   back pain   constipation   drowsy, dizzy   dry mouth   headache   stomach upset, gas   trouble sleeping  This list may not describe all possible side effects. Call your doctor for medical advice about side effects. You may report side effects to FDA at 1-800-FDA-1088.  Where should I keep my medicine?  Keep out of the reach of children.  Store at room temperature between 20 and 25 degrees C (68 and 77 degrees F). Keep container tightly closed. Throw away any unused medicine after the expiration date.  NOTE:This sheet is a summary. It may not cover all possible information. If you have questions about this medicine, talk to your doctor, pharmacist, or health care provider. Copyright 2015 Gold Standard          ````````````````        Pantoprazole Sodium Gastro-resistant tablet  What is this medicine?  PANTOPRAZOLE (pan TOE pra zole) prevents the production of acid in the stomach. It is used to treat gastroesophageal reflux disease (GERD), inflammation of the esophagus, and Zollinger-Ellison syndrome.  This medicine may be used for other purposes; ask your health care provider or pharmacist if you have questions.  What should I tell my health care provider before I take this medicine?  They need to know if you have any of these conditions:   liver disease   low levels of magnesium in the blood   an unusual or allergic reaction to omeprazole, lansoprazole, pantoprazole, rabeprazole, other medicines, foods, dyes, or preservatives   pregnant or trying to get pregnant   breast-feeding  How should I use this medicine?  Take this medicine by mouth. Swallow the tablets whole with a drink of water. Follow the directions on the prescription label. Do not crush, break, or chew. Take your medicine at regular intervals. Do not take your medicine more often than directed.  Talk to your pediatrician regarding the use of  this medicine in children. While this drug may be prescribed for children as young as 5 years for selected conditions, precautions do apply.  Overdosage: If you think you have taken too much of this medicine contact a poison control center or emergency room at once.  NOTE: This medicine is only for you. Do not share this medicine with others.  What if I miss a dose?  If you miss a dose, take it as soon as you can. If it is almost time for your next dose, take only that dose. Do not take double or extra doses.  What may interact with this medicine?  Do not take this medicine with any of the following medications:   atazanavir   nelfinavir  This medicine may also interact with the following medications:   ampicillin   delavirdine   digoxin   diuretics   iron salts   medicines for fungal infections like ketoconazole, itraconazole and voriconazole   warfarin  This list may not describe all possible interactions. Give your health care provider a list of all the medicines, herbs, non-prescription drugs, or dietary supplements you use. Also tell them if you smoke, drink alcohol, or use illegal drugs. Some items may interact with your medicine.  What should I watch for while using this medicine?  It can take several days before your stomach pain gets better. Check with your doctor or health care professional if your  condition does not start to get better, or if it gets worse.  You may need blood work done while you are taking this medicine.  What side effects may I notice from receiving this medicine?  Side effects that you should report to your doctor or health care professional as soon as possible:   allergic reactions like skin rash, itching or hives, swelling of the face, lips, or tongue   bone, muscle or joint pain   breathing problems   chest pain or chest tightness   dark yellow or [place] urine   dizziness   fast, irregular heartbeat   feeling faint or lightheaded   fever or sore throat   muscle  spasm   palpitations   redness, blistering, peeling or loosening of the skin, including inside the mouth   seizures   tremors   unusual bleeding or bruising   unusually weak or tired   yellowing of the eyes or skin  Side effects that usually do not require medical attention (Report these to your doctor or health care professional if they continue or are bothersome.):   constipation   diarrhea   dry mouth   headache   nausea  This list may not describe all possible side effects. Call your doctor for medical advice about side effects. You may report side effects to FDA at 1-800-FDA-1088.  Where should I keep my medicine?  Keep out of the reach of children.  Store at room temperature between 15 and 30 degrees C (59 and 86 degrees F). Protect from light and moisture. Throw away any unused medicine after the expiration date.  NOTE:This sheet is a summary. It may not cover all possible information. If you have questions about this medicine, talk to your doctor, pharmacist, or health care provider. Copyright 2015 Gold Standard

## 2014-07-13 NOTE — Discharge Summary (Signed)
Discharge Summary    Date:07/13/2014   Patient Name: [name], [name]  Attending Physician: No att. providers found    Date of Admission:   07/08/2014    Date of Discharge:   07/13/2014    Admitting Diagnosis:   Anemia  GI bleed    Discharge Dx:   1) Upper GI bleed due to Gastritis - cont to hold NSAIDs. Continue PPI and carafate. May resume aspirin and iron.  2) Stable Chronic Anemia - monitor.   3) PJI, S/p TKA revision reimplantation 07/01/14 - keep on amoxacillin at discharge.   4) Hx Non-healing surgical wound of right knee, with wound dehiscence and drainage - patient taken to OR for medial calf fasciocutaneous flap. F/u ortho and plastic surgery outpatient.   5) Uncontrolled Hypertension - Better controlled on higher dose of amlodipine.   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   8) Hypokalemia - replaced, continue home potassium replacement      Treatment Team:   Treatment Team:   Consulting Physician: Lane Hacker, MD   Dr [facility] - attending  Dr Campbell Lerner - plastic surg  Dr Dalbert Mayotte - GI    Procedures performed:   Radiology: all results from this admission  X-ray Knee Right Ap And Lateral    07/02/2014     Good position of the joint prothesis. No acute fracture or dislocation.  Kinnie Feil, MD  07/02/2014 8:59 AM     Surgery: all results from this admission  Procedure(s) with comments:  REVISION SOFT TISSUE CLOSURE RT KNEE WOUND DEHISCENCE (N/A) - (BHATT PLASTIC TRAY, GOYAL TK SET/SPECIALS SET- NO IMPLANTS)   **[id number]**  INCISION & DRAINAGE, WOUND, CLOSURE (N/A)    Reason for Admission:   Gastrointestinal Bleed  Hospital Course:   [name] is a 78 y.o. female recently discharged from [facility] who presented to the hospital with black stools. Patient was placed on Vitron C prior to discharge for anemia. She noted black stools at home. Patient took a picture of it and went to her PCP. She also had been complaining of epigastric pain associated with nausea and vomiting, and is not able to  eat much at home. Patient was sent to ED per PCP recommendation. In ED she had unremarkable work up, including EKG without ST changes, and CBC showing chronic anemia, with H/H higher then previous. Patient responded to discontinuation of NSAIDs. GI was consulted after she tested positive for occult blood in stool. EGD showed gastritis. Patient was placed on PPI and carafate and is now tolerating diet well. She was evaluated for her chronic left knee wound and was taken back to OR for soft tissue rotational flap on 10/2. She was on IV abx through out hospital course but is now stable for discharge. Plan will be to resume amoxacillin and f/u with ID. Mr Balderston is also scheduled to f/u with plastic and orthopedic surgery.     Condition at Discharge:   Improved.   Today:     BP 102/70 mmHg  Pulse 78  Temp(Src) 95.4 F (35.2 C) (Oral)  Resp 18  Ht 1.575 m (5\' 2" )  Wt 98 kg (216 lb 0.8 oz)  BMI 39.51 kg/m2  SpO2 99%  Ranges for the last 24 hours:  Temp:  [95.4 F (35.2 C)-97.7 F (36.5 C)] 95.4 F (35.2 C)  Heart Rate:  [77-78] 78  Resp Rate:  [18-20] 18  BP: (102-190)/(70-82) 102/70 mmHg  Last set of labs     Recent Labs  Lab 07/13/14  0652   WBC 9.34   HEMOGLOBIN 9.2*   HEMATOCRIT 28.9*   PLATELETS 276       Recent Labs  Lab 07/13/14  0652   SODIUM 148*   POTASSIUM 3.6   CHLORIDE 113*   CO2 22   BUN 6*   CREATININE 1.1*   EGFR 58.4   GLUCOSE 108*   CALCIUM 8.2       Recent Labs  Lab 07/13/14  0652   BILIRUBIN, TOTAL 0.9   PROTEIN, TOTAL 6.3   ALBUMIN 2.6*   ALT 8   AST (SGOT) 21       Recent Labs  Lab 07/10/14  0420   PT 15.3*   PT INR 1.2*     No results for input(s): TSH, FREET3, T4FREE in the last 168 hours.    Invalid input(s): FREET4  No results for input(s): CHOL, TRIG, HDL, LDL, LDLDIRECT in the last 168 hours.  No results for input(s): CK, TROPI, TROPT, CKMBINDEX in the last 168 hours.  Microbiology Results     Procedure Component Value Units Date/Time    MRSA culture [[id number]] Collected:  07/08/14  0516    Specimen Information:  Body Fluid / Nasal/Throat ASC Admission Updated:  07/09/14 1000    Narrative:      ORDER#: [id number]                                    ORDERED BY: Elyn Peers  SOURCE: Nares and Throat                             COLLECTED:  07/08/14 05:16  ANTIBIOTICS AT COLL.:                                RECEIVED :  07/08/14 10:50  Culture MRSA Surveillance                  FINAL       07/09/14 10:00   +  07/09/14   Positive for Methicillin Resistant Staph aureus from Throat             Negative for Methicillin Resistant Staph aureus from Nares      Urine culture [[id number]] Collected:  07/08/14 1640    Specimen Information:  Urine / Urine, Clean Catch Updated:  07/10/14 1638    Narrative:      ORDER#: [id number]                                    ORDERED BY: Tobi Bastos  SOURCE: Urine, Clean Catch                           COLLECTED:  07/08/14 16:40  ANTIBIOTICS AT COLL.:                                RECEIVED :  07/08/14 20:20  Culture Urine  FINAL       07/10/14 16:38   +  07/10/14   1,000 - 9,000 CFU/ML of normal urogenital or skin microbiota including             2,000 CFU/ML yeast, no further work  07/10/14   >100,000 CFU/ML Vancomycin Resistant Enterococcus faecium               Vancomycin Resistant Enterococcus    07/09/14   10,000 - 30,000 CFU/ML Gram negative rod               Lactose fermenting gram negative rod, No further work,             Questionable significance due to low quantity.    _____________________________________________________________________________                                      VRE         ANTIBIOTICS                     MIC  INTRP      _____________________________________________________________________________  Ampicillin                      >8     R        Daptomycin                       4     S        Gentamicin High Level Resistan <=500   S        Levofloxacin                    >4     R        Linezolid                         2     S        Penicillin                      >8     R        Streptomycin High Level Resist >1000   R        Tetracycline                    >8     R        Vancomycin                      >16    R        _____________________________________________________________________________            S=SUSCEPTIBLE     I=INTERMEDIATE     R=RESISTANT                            N/S=NON-SUSCEPTIBLE  _____________________________________________________________________________              Micro / Labs / Path pending:     Unresulted Labs     None          Discharge Instructions For Providers     1. Continue protonix and carafate for at least 2 weeks. Avoid NSAIDs. Resume aspirin enteric coated.  Discharge Instructions:     Follow-up Information     Follow up with Janalyn Rouse, MD .    Specialty:  Internal Medicine    Contact information:    [address]  [place] [address]  [phone]          Follow up with Ernie Avena, MD On 07/17/2014.    Specialty:  Plastic Surgery    Why:  For wound re-check    Contact information:    [address]  Hyper[address] Unit  [place] [address]  [phone]          Follow up with Lane Hacker, MD In 2 weeks.    Specialty:  Orthopaedic Surgery    Contact information:    [address]  [place] [address]  [phone]          Follow up with Ray Church, MD In 1 month.    Specialties:  Infectious Disease, Internal Medicine    Contact information:    [address] [facility] [address]  [place] [address]  [phone]            Discharge Diet: Cardiac Diet        Discharge References/Attachments     None        Disposition:  Home or Self Care     Discharge Medication List      Taking          * amLODIPine 5 MG tablet   Dose:  5 mg   What changed:  Another medication with the same name was added. Make sure you understand how and when to take each.   Commonly known as:  NORVASC   Take 5 mg by mouth daily.       * amLODIPine 10 MG tablet   Dose:  10  mg   What changed:  You were already taking a medication with the same name, and this prescription was added. Make sure you understand how and when to take each.   Commonly known as:  NORVASC   Take 1 tablet (10 mg total) by mouth daily.       amoxicillin 875 MG tablet   Dose:  875 mg   Commonly known as:  AMOXIL   Take 1 tablet (875 mg total) by mouth 2 (two) times daily.   Notes to Patient:  Take amoxicillin 875 mg 1 tab twice a day for 30 days and once a day forever;        aspirin EC 325 MG EC tablet   Dose:  325 mg   Take 1 tablet (325 mg total) by mouth daily.       cloNIDine 0.1 MG tablet   Dose:  0.1 mg   Commonly known as:  CATAPRES   Take 1 tablet (0.1 mg total) by mouth daily.       docusate sodium 100 MG capsule   Dose:  100 mg   Commonly known as:  COLACE   Take 1 capsule (100 mg total) by mouth 2 (two) times daily.       gabapentin 300 MG capsule   Dose:  300 mg   What changed:  when to take this   Commonly known as:  NEURONTIN   Take 1 capsule (300 mg total) by mouth every 12 (twelve) hours.       hydrochlorothiazide 25 MG tablet   Dose:  25 mg   Commonly known as:  HYDRODIURIL   Take 25 mg by mouth daily.       HYDROcodone-acetaminophen 5-325 MG per tablet   Dose:  1-2 tablet   Commonly known as:  NORCO   Take 1-2 tablets by mouth every 4 (four) hours as needed for Pain (max 4000mg  tylenol/acetaminophen daily).       iron-vitamin C 65-125 MG Tabs   Dose:  1 tablet   Commonly known as:  VITRON-C   Take 1 tablet by mouth daily.       LORATADINE PO   Dose:  10 mg   Take 10 mg by mouth as needed.       multivitamin Tabs   Dose:  1 tablet   Take 1 tablet by mouth daily.       mupirocin 2 % nasal ointment   Commonly known as:  BACTROBAN NASAL   by Nasal route 2 (two) times daily. Use one-half of tube in each nostril twice daily for four days.       nebivolol 5 MG tablet   Dose:  5 mg   Commonly known as:  BYSTOLIC   Take 5 mg by mouth daily.       ondansetron 4 MG tablet   Dose:  4 mg   Commonly known as:   ZOFRAN   Take 1 tablet (4 mg total) by mouth every 6 (six) hours as needed for Nausea.       pantoprazole 40 MG tablet   Dose:  40 mg   Commonly known as:  PROTONIX   Take 1 tablet (40 mg total) by mouth every morning before breakfast.       potassium chloride 20 MEQ tablet   Dose:  20 mEq   Commonly known as:  K-DUR,KLOR-CON   Take 1 tablet (20 mEq total) by mouth 2 (two) times daily.       sucralfate 1 GM/10ML suspension   Dose:  1 g   Commonly known as:  CARAFATE   Take 10 mLs (1 g total) by mouth 4 times daily with meals and at bedtime.       tiotropium 18 MCG inhalation capsule   Dose:  18 mcg   Commonly known as:  SPIRIVA   Place 18 mcg into inhaler and inhale daily.       traMADol 50 MG tablet   Dose:  50 mg   Commonly known as:  ULTRAM   Take 1 tablet (50 mg total) by mouth every 6 (six) hours as needed for Pain.       * Notice:  This list has 2 medication(s) that are the same as other medications prescribed for you. Read the directions carefully, and ask your doctor or other care provider to review them with you.      STOP taking these medications          celecoxib 200 MG capsule   Commonly known as:  CELEBREX           Minutes spent coordinating discharge and reviewing discharge plan: 35 minutes      Signed by: Pearletha Forge, MD

## 2014-07-13 NOTE — Plan of Care (Signed)
Problem: Physical Therapy  Goal: Patient condition is improving per Physical Therapy Treatment Plan  Outcome: Adequate for Discharge  Adequate for discharge home with transfers and family support.  Patient is working on gait at 50% WB but can continue this with HHPT.

## 2014-07-13 NOTE — Progress Notes (Signed)
CM met with pt and family to discuss [place] plan  Pt plans to [facility]   Transportation by family   PT will address equipment needs  Home Care-[facility]  Pt agreeable with plan

## 2014-07-13 NOTE — Progress Notes (Signed)
Patient d/c home today, transport provided by son; discharge instructions discussed with patient, educated with new medication including side effects; instructed with follow up appointments; flu vaccine given injected on her left deltoid; Vs stable; VIS given today; IV access removed; prescriptions and belongings given;

## 2014-07-13 NOTE — PT Progress Note (Signed)
Physical Therapy Note  Physical Therapy Treatment    Patient:  [name]        MRN#:  [id number]  Unit:  4A ACUTE JOINT        Room/Bed:  V409/W119-14    Time of treatment: Start Time: 1445 Stop Time: 1515  Time Calculation (min): 30 min  PT Received On: 07/13/14    Treatment #: PT Visit Number: 3/7    Precautions  Weight Bearing Status: RLE partial weight bearing  Weight Bearing Percent: 50  Total Knee Replacement: hinge brace;at all times (locked in extension)  Precaution Instructions Given to Patient: Yes      Medical Diagnosis:   Acute GI bleeding [578.9 (ICD-9-CM)]  Surgical Procedure:Right  REVISION SOFT TISSUE CLOSURE RT KNEE WOUND DEHISCENCE  INCISION & DRAINAGE, WOUND, CLOSURE performed by Dr. Campbell Lerner on 07/10/2014.    Patient's medical condition is appropriate for Physical Therapy  intervention at this time.  Patient cleared by nurse to participate in PT session, nursing reports patient anticipates discharge home today.    ASSESSMENT   Patient fatigues easily but achieves goals with plentoy of rest in between.  She has son and other family support upon discharge at home.  She demonstrates transfers well and is able to maneuver w/c around the house.  Patient feels she can manage at home after completing tasks while here in hospital.  She completed up/down 4 stairs and will be on one level once inside the home.  Prior to admission she sponge bathes and amb short distances.  For now, she can transfer between surfaces and on commode and work on gait distances with HHPT.    Goals per Eval/Re-eval:     Goals  Goal Formulation: With patient  Time for Goal Acheivement:  (continue with present plan and goals)  Goals: Select goal  Pt Will Go Supine To Sit: with supervision, to maximize functional mobility and independence  Pt Will Perform Sit to Stand: with supervision, to maximize functional mobility and independence  Pt Will Transfer Bed/Chair: with supervision, to maximize functional mobility and independence,  Goal met  Pt Will Transfer to Toilet: with rolling walker, with supervision, to maximize functional mobility and independence, Goal met  Pt Will Transfer to Tub/Shower: with rolling walker, with supervision, to maximize functional mobility and independence (n/a, sponge bathes)  PT Will Adult nurse Technique: with rolling walker, with supervision, to maximize functional mobility and independence  Pt Will Ambulate: with rolling walker, with stand by assist, to maximize functional mobility and independence, Discontinued (comment), New goal, 31-50 feet (not met, pt can manage BSC near bed at home)  Pt Will Go Up / Down Stairs: 3-5 stairs, with contact guard assist, to maximize functional mobility and independence, Goal met  Pt Will Perform Home Exer Program: with minimal assist, to maximize functional mobility and independence  Pt Will Perform LE Dressing w/Device: with minimal assist, With adaptive equipment, to maximize functional mobility and independence, Met (with assist from family)    PLAN     Recommendation  Discharge Recommendation: Home with supervision, Home with home health PT  DME Recommended for Discharge:  (has BSC, RW, wheelchair)  PT - Next Visit Recommendation: 07/13/14  PT Frequency: 7x/wk    Continue plan of care.    SUBJECTIVE     Patient is agreeable to participation in the therapy session. Family and/or guardian are agreeable to patient's participation in the therapy session. Nursing clears patient for therapy.  Patient Goal: go home  Pain Assessment  Pain Assessment: No/denies pain  Pain Score: 0-No pain  Patient's Stated Comfort Functional Goal: 3-mild pain  Pain Intervention(s): Repositioned;Ambulation/increased activity          OBJECTIVE     Patient is seated in a bedside chair seen 3 Days Post-Op with hinged knee brace locked in extension.    Gross ROM  Right Lower Extremity ROM: within functional limits (IROM brace locked in extension)  Left Lower Extremity ROM: within  functional limits              Sensation is intact     Cognition  Arousal/Alertness: Appropriate responses to stimuli  Orientation Level: Oriented X4  Following Commands: Follows all commands and directions without difficulty  Safety Awareness: independent    Functional Mobility  Supine to Sit: Contact Guard Assist  Scooting to EOB: Stand by Assist  Sit to Stand: Supervision  Stand to Sit: Supervision    Transfers  Bed to Chair: Supervision    Locomotion  Ambulation: Supervision  Ambulation Distance (Feet):  (fes steps)  Pattern: decreased cadence (flexed posture)    Self Care and Home Management:  Toilet transfer technique: Patient performed supervision, via simulated chair transfer  Shower transfer technique: pt reports she sponge bathes at home since shower is one flight downstairs, she would like to work her way to be able to get to shower in future.    Educated the patient to role of physical therapy, plan of care, goals  of therapy and Physical Therapy  Weight Bearing  Gait Techniques.        Patient is seated in a bedside chair with all needs provided and call bell within reach. RN notified of session outcome, including cleared PT for next level of care.        Signature: Mathews Argyle, PT 07/13/2014 3:17 PM

## 2014-07-13 NOTE — Clinical Note (Incomplete)
.  h2h.

## 2014-07-13 NOTE — Plan of Care (Signed)
Problem: Physical Therapy  Goal: Patient condition is improving per Physical Therapy Treatment Plan  Outcome: Progressing  PT Plan  Treatment/Interventions: Exercise, Functional transfer training, Bed mobility  Recommendation  Discharge Recommendation: Home with supervision;Home with home health PT  DME Recommended for Discharge: [facility]  PT - Next Visit Recommendation: 07/13/14  PT Frequency: 7x/wk    Patient is agreeable to the following Goals:   Goals  Goal Formulation: With patient  Time for Goal Acheivement:  (continue with present plan and goals)  Goals: Select goal  Pt Will Go Supine To Sit: with supervision, to maximize functional mobility and independence  Pt Will Perform Sit to Stand: with supervision, to maximize functional mobility and independence  Pt Will Transfer Bed/Chair: with supervision, to maximize functional mobility and independence  Pt Will Transfer to Toilet: with rolling walker, with supervision, to maximize functional mobility and independence  Pt Will Transfer to Tub/Shower: with rolling walker, with supervision, to maximize functional mobility and independence  PT Will Advertising account executive Transfer Technique: with rolling walker, with supervision, to maximize functional mobility and independence  Pt Will Ambulate: with rolling walker, with stand by assist, to maximize functional mobility and independence, Discontinued (comment), New goal, 31-50 feet  Pt Will Go Up / Down Stairs: 3-5 stairs, with contact guard assist, to maximize functional mobility and independence  Pt Will Perform Home Exer Program: with minimal assist, to maximize functional mobility and independence  Pt Will Perform LE Dressing w/Device: with minimal assist, With adaptive equipment, to maximize functional mobility and independence

## 2014-07-13 NOTE — Addendum Note (Signed)
Addendum  created 07/13/14 0737 by Laurine Blazer, CRNA    Modules edited: Anesthesia Events, Narrator    Narrator:  Narrator: Event Log Edited

## 2014-07-13 NOTE — PT Eval Note (Signed)
[facility]  [address]  [place] [address]  [phone]    Physical Therapy Re-Evaluation    Patient: [name] MRN: [id number]   Unit: 4A ACUTE JOINT Bed: A213/Y865-78    Time of Treatment: Time Calculation  PT Received On: 07/13/14  Start Time: 0900  Stop Time: 0940  Time Calculation (min): 40 min    Consult received for [name] for PT evaluation and treatment.  Patient's medical condition is appropriate for Physical Therapy  intervention at this time.    Precautions  Weight Bearing Status: RLE partial weight bearing  Weight Bearing Percent: 50  Total Knee Replacement: hinge brace (locked in extension and medial bar removed)  Precaution Instructions Given to Patient: Yes  MD orders for Exercises: NO     History of Present Illness: [name] is a 78 y.o. female admitted on 07/08/2014   Medical Diagnosis: Acute GI bleeding [578.9 (ICD-9-CM)]  Surgical Diagnosis: Right  Procedure(s):  REVISION SOFT TISSUE CLOSURE RT KNEE WOUND DEHISCENCE  INCISION & DRAINAGE, WOUND, CLOSURE performed by  Thornton Papas, MD on 07/10/2014.      Patient Active Problem List   Diagnosis   . Total knee replacement status   . Low back pain   . Chronic obstructive pulmonary disease   . Hypertension   . Debility following multiple R knee revisions   . Non-healing surgical wound- right knee    . Fever   . Leukocytosis   . Anemia   . Hypokalemia   . Chronic infection of prosthetic knee   . COPD (chronic obstructive pulmonary disease)   . HTN (hypertension)   . Infection   . Osteoarthritis   . GI bleed     Past Medical History   Diagnosis Date   . Hypertension    . Pneumonia    . Chronic obstructive pulmonary disease    . Headache(784.0)      not since surgery in 1957   . Arthritis    . Difficulty in walking(719.7)    . Low back pain    . Urinary tract infection    . Depression      Past Surgical History   Procedure Laterality Date   . Cervical spine surgery     . Lumbar spine surgery  posterior w/ hardware   .  Tonsillectomy     . Brain surgery  R side/ 1957 Optic nerve pressure   . Eye surgery       cataracts   . Colonoscopy     . Tubal ligation  1961   . Arthroplasty, knee, total  08/25/2013     Procedure: ARTHROPLASTY, KNEE, TOTAL;  Surgeon: Lane Hacker, MD;  Location: [facility];  Service: Orthopedics;  Laterality: Right;   . Arthroplasty, knee, revision total  09/05/2013     Procedure: ARTHROPLASTY, KNEE, REVISION TOTAL;  Surgeon: Lane Hacker, MD;  Location: [facility];  Service: Orthopedics;  Laterality: Right;  RT KNEE WASHOUT & POLY EXCHANGE **POA/CH** (STRYKER)    . Arthroplasty, knee, resection  09/15/2013     Procedure: ARTHROPLASTY, KNEE, RESECTION;  Surgeon: Lane Hacker, MD;  Location: [facility];  Service: Orthopedics;  Laterality: Right;  AND PLACEMENT OF ANTIBIOTIC SPACER  Placement of wound vac   . Arthroplasty, knee, revision total  09/22/2013     Procedure: ARTHROPLASTY, KNEE, REVISION TOTAL;  Surgeon: Lane Hacker, MD;  Location: [facility];  Service: Orthopedics;  Laterality:  Right;   . Debridement, muscle flap closure  09/22/2013     Procedure: DEBRIDEMENT, MUSCLE FLAP CLOSURE;  Surgeon: Ernie Avena, MD;  Location: [facility];  Service: Plastics;  Laterality: Right;  DEBRIDEMENT & MUSCLE FLAP KNEE RT   . Graft, skin  split thickness, (medical)  09/22/2013     Procedure: GRAFT, SKIN  SPLIT THICKNESS, (MEDICAL);  Surgeon: Ernie Avena, MD;  Location: [facility];  Service: Plastics;  Laterality: N/A;   . Echocardiogram, transesophageal N/A 03/26/2014     Procedure: ECHOCARDIOGRAM, TRANSESOPHAGEAL;  Surgeon: Marni Griffon, MD;  Location: [facility];  Service: Cardiology;  Laterality: N/A;   . Arthroplasty, knee, revision total Right 07/01/2014     Procedure: ARTHROPLASTY, KNEE, REVISION TOTAL;  Surgeon: Lane Hacker, MD;  Location: [facility];  Service: Orthopedics;  Laterality: Right;   . Egd N/A 07/09/2014     Procedure: EGD;  Surgeon: Leory Plowman., MD;  Location: [facility];  Service: Gastroenterology;  Laterality: N/A;       Social History:  Prior Level of Function  DME Currently at Home: Front wheel walker;Single point cane;3-in-1 [facility]    Home Living Arrangements  Living Arrangements: Children  Type of Home: House  Home Layout: Two level;Stairs to enter with rails (add number in comment) (3)  Bathroom Shower/Tub: Walk-in shower (on second floor)  Bathroom Toilet: Standard  Bathroom Accessibility: Accessible (rolls w/c to door, amb to toilet)  DME Currently at Home: Front wheel walker;Single point cane;3-in-1 [facility]    ASSESSMENT       [name] was seen today for Re-eval after a Medial calf fasciocutaneous flap was performed by Dr Campbell Lerner on 07/09/14 due to poor wound healing of prior surgery.  Orders were received for PT Eval and treat and 50% WB on the right and Right LE in IROM locked in extension with medial bar removed to avoid pressure on the wound site.  [name] did well today with mobility OOB and was able to walk 15 feet heading to the BR. She became fatigued and had to sit down before making to the BR.  Nurse was notified that this patient can use a BSC, but is not ready to ambulate into the BR at this time due to decreased endurance.  Will continue to progress to tolerance and work towards stated goals and D/C home when ready.     [name] is a 78 y.o. female admitted 07/08/2014. Pt presents with  Right   Procedure(s):  REVISION SOFT TISSUE CLOSURE RT KNEE WOUND DEHISCENCE  INCISION & DRAINAGE, WOUND, CLOSURE. Patient present with deficits in strength, function, ambulation and ADLS.  Pt would benefit from skilled Physical Therapy Services.  Assessment  Assessment: Decreased LE ROM;Decreased LE strength;Decreased endurance/activity tolerance;Decreased functional mobility  Prognosis: Good;With continued PT status post acute discharge  Progress: Progressing toward goals     Goals  Goal Formulation: With patient  Time for Goal  Acheivement:  (continue with present plan and goals)  Goals: Select goal  Pt Will Go Supine To Sit: with supervision, to maximize functional mobility and independence  Pt Will Perform Sit to Stand: with supervision, to maximize functional mobility and independence  Pt Will Transfer Bed/Chair: with supervision, to maximize functional mobility and independence  Pt Will Transfer to Toilet: with rolling walker, with supervision, to maximize functional mobility and independence  Pt Will Transfer to Tub/Shower: with rolling walker, with supervision, to  maximize functional mobility and independence  PT Will Adult nurse Technique: with rolling walker, with supervision, to maximize functional mobility and independence  Pt Will Ambulate: with rolling walker, with stand by assist, to maximize functional mobility and independence, Discontinued (comment), New goal, 31-50 feet  Pt Will Go Up / Down Stairs: 3-5 stairs, with contact guard assist, to maximize functional mobility and independence  Pt Will Perform Home Exer Program: with minimal assist, to maximize functional mobility and independence  Pt Will Perform LE Dressing w/Device: with minimal assist, With adaptive equipment, to maximize functional mobility and independence    Rehabilitation Potential  Prognosis: Good;With continued PT status post acute discharge      PLAN       Plan  Risks/Benefits/POC Discussed with Pt/Family: With patient  Patient Goal: go home  Treatment/Interventions: Exercise;Functional transfer training;Bed mobility  PT Frequency: 7x/wk    Recommendation  Discharge Recommendation: Home with supervision;Home with home health PT  DME Recommended for Discharge: [facility]  PT - Next Visit Recommendation: 07/13/14  PT Frequency: 7x/wk            SUBJECTIVE      Patient is agreeable to participation in the therapy session.  Patient Goal  Patient Goal: go home      Pain Assessment  Pain Assessment: Numeric Scale (0-10)  Pain Score: 0-No pain  Patient's  Stated Comfort Functional Goal: 3-mild pain  Pain Intervention(s): Repositioned;Ambulation/increased activity            OBJECTIVE     Patient is in bed with IV, dressings and IROM locked in extension with medial bar removed, seen 3 Days Post-Op.    Observation of patient:     Cognition  Arousal/Alertness: Appropriate responses to stimuli  Orientation Level: Oriented X4  Following Commands: Follows all commands and directions without difficulty  Safety Awareness: independent       Vitals: Supine BP 175/88, HR 74  Sitting BP 176/87, HR 75      Musculoskeletal Examination  Gross ROM  Right Lower Extremity ROM: within functional limits (IROM brace locked in extension)  Left Lower Extremity ROM: within functional limits                                  Sensation is intact , to light touch, bilateral lower extremities    Gross Strength  Right Lower Extremity Strength:  (DF 4/5, knee NT, hip flex 3/5, abduct 2+/5)  Left Lower Extremity Strength: within functional limits                  Functional Mobility  Supine to Sit: Contact Guard Assist  Scooting to EOB: Stand by Assist  Sit to Stand: Risk analyst  Stand to Sit: Tour manager  Ambulation: Contact Guard Assist;with front-wheeled walker  Ambulation Distance (Feet): 15 Feet (unable to make it to the BR  due to decreased endurance)  Pattern: decreased cadence;decreased step length    Pt did not perform self care activities at this time.    Therapeutic Exercises:  Straight Leg Raise: 5  Ankle Pumps: 5      Participation and Endurance  Participation Effort: good  Endurance: Tolerates 30 min exercise with multiple rests        Educated the patient to role of physical therapy, plan of care, goals  of therapy and  Physical Therapy  Precautions  Adaptive Equipment  Weight Bearing  Gait Techniques  Transfer Techniques  Leg Exercises.    Patient continues to need focused education on Precautions  Adaptive Equipment  Weight Bearing  Gait  Techniques  Transfer Techniques  Leg Exercises  Range of Motion Exercises.    Patient is seated in wheelchair in room  with all needs provided and call bell within reach. RN notified of session outcome.            Signature: Howie Ill, PT

## 2014-07-13 NOTE — Plan of Care (Signed)
Problem: Health Promotion  Goal: Knowledge - disease process  Extent of understanding conveyed about a specific disease process.   Outcome: Progressing  Patient does not have any active bleeding, denies chest pain, denies sob, on RA sating adequately; will cont plan of care    Problem: Pain  Goal: Patient's pain/discomfort is manageable  Outcome: Progressing  Patient c/o 6/10 to R knee, prn norco given;     Problem: Potential for Compromised Skin Integrity  Goal: Skin integrity is maintained or improved  Assess and monitor skin integrity. Identify patients at risk for skin breakdown on admission and per policy. Collaborate with interdisciplinary team and initiate plans and interventions as needed.   Outcome: Progressing  Patient  ace wrap and knee immobilizer in R leg is c/d/i; protective cream applied to excoriated groin;

## 2014-07-14 ENCOUNTER — Encounter: Payer: Self-pay | Admitting: Orthopaedic Surgery

## 2014-07-17 ENCOUNTER — Ambulatory Visit: Payer: No Typology Code available for payment source | Attending: Plastic Surgery

## 2014-07-17 DIAGNOSIS — M1990 Unspecified osteoarthritis, unspecified site: Secondary | ICD-10-CM | POA: Insufficient documentation

## 2014-07-17 DIAGNOSIS — Z7982 Long term (current) use of aspirin: Secondary | ICD-10-CM | POA: Insufficient documentation

## 2014-07-17 DIAGNOSIS — T8189XA Other complications of procedures, not elsewhere classified, initial encounter: Secondary | ICD-10-CM | POA: Insufficient documentation

## 2014-07-17 DIAGNOSIS — S81802D Unspecified open wound, left lower leg, subsequent encounter: Secondary | ICD-10-CM | POA: Insufficient documentation

## 2014-07-17 DIAGNOSIS — J449 Chronic obstructive pulmonary disease, unspecified: Secondary | ICD-10-CM | POA: Insufficient documentation

## 2014-07-17 DIAGNOSIS — I10 Essential (primary) hypertension: Secondary | ICD-10-CM | POA: Insufficient documentation

## 2014-07-17 DIAGNOSIS — T8189XD Other complications of procedures, not elsewhere classified, subsequent encounter: Secondary | ICD-10-CM

## 2014-07-17 DIAGNOSIS — Z7401 Bed confinement status: Secondary | ICD-10-CM | POA: Insufficient documentation

## 2014-07-17 DIAGNOSIS — Y834 Other reconstructive surgery as the cause of abnormal reaction of the patient, or of later complication, without mention of misadventure at the time of the procedure: Secondary | ICD-10-CM | POA: Insufficient documentation

## 2014-07-24 NOTE — Plan of Care (Signed)
Problem: Health Promotion  Goal: Knowledge - health resources  Extent of understanding and conveyed about healthcare resources.   Intervention: Discharge planning  Follow-up phone call to pt on... 07/22/14  How are you doing today?Marland KitchenMarland KitchenMarland KitchenMy knee is doing ok.  Any questions about [place] instructions?Marland KitchenMarland KitchenMarland KitchenNo  Has the Home Care contacted you/started?Marland Kitchen. Yes, I have the same team as last time.   Anything else I can do for you?Marland KitchenMarland KitchenMarland KitchenNo  Follow-up actions.Marland KitchenMarland Kitchen

## 2014-07-28 ENCOUNTER — Ambulatory Visit: Payer: No Typology Code available for payment source | Admitting: Pain Medicine

## 2014-07-28 ENCOUNTER — Ambulatory Visit: Payer: No Typology Code available for payment source | Admitting: Gastroenterology

## 2014-07-28 ENCOUNTER — Ambulatory Visit
Admission: RE | Admit: 2014-07-28 | Discharge: 2014-07-28 | Disposition: A | Discharge status: Home / Self Care | Payer: No Typology Code available for payment source | Source: Ambulatory Visit | Attending: Gastroenterology | Admitting: Gastroenterology

## 2014-07-28 ENCOUNTER — Encounter
Admission: RE | Disposition: A | Discharge status: Home / Self Care | Payer: Self-pay | Source: Ambulatory Visit | Attending: Gastroenterology

## 2014-07-28 DIAGNOSIS — M1990 Unspecified osteoarthritis, unspecified site: Secondary | ICD-10-CM | POA: Insufficient documentation

## 2014-07-28 DIAGNOSIS — R262 Difficulty in walking, not elsewhere classified: Secondary | ICD-10-CM | POA: Insufficient documentation

## 2014-07-28 DIAGNOSIS — R51 Headache: Secondary | ICD-10-CM | POA: Insufficient documentation

## 2014-07-28 DIAGNOSIS — K209 Esophagitis, unspecified: Secondary | ICD-10-CM | POA: Insufficient documentation

## 2014-07-28 DIAGNOSIS — Z87891 Personal history of nicotine dependence: Secondary | ICD-10-CM | POA: Insufficient documentation

## 2014-07-28 DIAGNOSIS — M545 Low back pain: Secondary | ICD-10-CM | POA: Insufficient documentation

## 2014-07-28 DIAGNOSIS — I10 Essential (primary) hypertension: Secondary | ICD-10-CM | POA: Insufficient documentation

## 2014-07-28 DIAGNOSIS — K254 Chronic or unspecified gastric ulcer with hemorrhage: Secondary | ICD-10-CM | POA: Insufficient documentation

## 2014-07-28 DIAGNOSIS — J449 Chronic obstructive pulmonary disease, unspecified: Secondary | ICD-10-CM | POA: Insufficient documentation

## 2014-07-28 HISTORY — PX: EGD: SHX3789

## 2014-07-28 SURGERY — DONT USE, USE 1095-ESOPHAGOGASTRODUODENOSCOPY (EGD), DIAGNOSTIC
Anesthesia: Anesthesia General | Site: Mouth | Wound class: Clean Contaminated

## 2014-07-28 MED ORDER — FENTANYL CITRATE 0.05 MG/ML IJ SOLN
25.0000 ug | INTRAMUSCULAR | Status: DC | PRN
Start: 2014-07-28 — End: 2014-07-28

## 2014-07-28 MED ORDER — MEPERIDINE HCL 25 MG/ML IJ SOLN
25.0000 mg | INTRAMUSCULAR | Status: DC | PRN
Start: 2014-07-28 — End: 2014-07-28

## 2014-07-28 MED ORDER — ONDANSETRON HCL 4 MG/2ML IJ SOLN
4.0000 mg | Freq: Once | INTRAMUSCULAR | Status: DC | PRN
Start: 2014-07-28 — End: 2014-07-28

## 2014-07-28 MED ORDER — LACTATED RINGERS IV SOLN
INTRAVENOUS | Status: DC
Start: 2014-07-28 — End: 2014-07-28

## 2014-07-28 MED ORDER — HYDROCODONE-ACETAMINOPHEN 5-325 MG PO TABS
1.0000 | ORAL_TABLET | Freq: Once | ORAL | Status: DC | PRN
Start: 2014-07-28 — End: 2014-07-28

## 2014-07-28 MED ORDER — PROPOFOL 10 MG/ML IV EMUL
INTRAVENOUS | Status: DC | PRN
Start: 2014-07-28 — End: 2014-07-28
  Administered 2014-07-28: 100 mg via INTRAVENOUS

## 2014-07-28 MED ORDER — LIDOCAINE HCL (PF) 2 % IJ SOLN
INTRAMUSCULAR | Status: AC
Start: 2014-07-28 — End: ?
  Filled 2014-07-28: qty 5

## 2014-07-28 MED ORDER — GLYCOPYRROLATE 0.2 MG/ML IJ SOLN
INTRAMUSCULAR | Status: AC
Start: 2014-07-28 — End: ?
  Filled 2014-07-28: qty 1

## 2014-07-28 MED ORDER — METOCLOPRAMIDE HCL 5 MG/ML IJ SOLN
10.0000 mg | Freq: Once | INTRAMUSCULAR | Status: DC | PRN
Start: 2014-07-28 — End: 2014-07-28

## 2014-07-28 MED ORDER — PROPOFOL 10 MG/ML IV EMUL
INTRAVENOUS | Status: AC
Start: 2014-07-28 — End: ?
  Filled 2014-07-28: qty 20

## 2014-07-28 MED ORDER — GLYCOPYRROLATE 0.2 MG/ML IJ SOLN
INTRAMUSCULAR | Status: DC | PRN
Start: 2014-07-28 — End: 2014-07-28
  Administered 2014-07-28: 0.2 mg via INTRAVENOUS

## 2014-07-28 MED ORDER — HYDROMORPHONE HCL 1 MG/ML IJ SOLN
0.5000 mg | INTRAMUSCULAR | Status: DC | PRN
Start: 2014-07-28 — End: 2014-07-28

## 2014-07-28 MED ORDER — LIDOCAINE HCL (PF) 2 % IJ SOLN
INTRAMUSCULAR | Status: DC | PRN
Start: 2014-07-28 — End: 2014-07-28
  Administered 2014-07-28: 5 mL via INTRADERMAL

## 2014-07-28 SURGICAL SUPPLY — 69 items
BALLOON FEEDING 24FR X 4.4CM (Endoscopic Supplies) IMPLANT
BALN DILATOR 18-20MM 8CM (Balloon) IMPLANT
BELT RB FM UNV 40X6IN LF NS SLD PNL ELC (Patient Supply) ×2
BELT RIB UNIVERSAL L40 IN X W6 IN FOAM DEROYAL SOLID PANEL ELASTIC (Patient Supply) ×1 IMPLANT
BLOCK BITE MAXI 60FR LF STRD STRAP SDPRT (Procedure Accessories) ×1
BLOCK BITE OD60 FR STURDY STRAP SIDEPORT (Procedure Accessories) ×1
BLOCK BITE OD60 FR STURDY STRAP SIDEPORT DENTAL RETENTION RIM MAXI (Procedure Accessories) ×1 IMPLANT
BRUSH CYTO CNMD 3MM 2.1MM 230CM LF STRL (Brushes)
BRUSH CYTOLOGY 230CM SHTH BRSTL PSTV RING 3MM 2.1MM CONMED COLONOSCOPE (Brushes) IMPLANT
BRUSH CYTOLOGY L230 CM SHEATH BRISTLE (Brushes)
BUTTON GSTRM SIL 5ML MCKY SECUR-LOK 20FR (Tubing)
BUTTON MIC-KEY SECUR-LOK GASTROSTOMY (Tubing)
BUTTON MIC-KEY SECUR-LOK GASTROSTOMY OD20 FR L3.5 CM 5 ML RADIOPAQUE (Tubing) IMPLANT
CATHETER BALLOON DILATATION CRE 2.8 MM (Dilator)
CATHETER BALLOON DILATATION CRE PEBAX (Balloon) IMPLANT
CATHETER OD15-16.5-18 MM ODSEC6 FR L180 CM CRE BALLOON DILATATION L8 (Dilator) IMPLANT
CATHETER OD15-16.5-18 MM ODSEC6 FR L180 CM CREâ„¢ BALLOON DILATATION L8 (Dilator) IMPLANT
CATHETER OD6 FR ODSEC12-13.5-15 MM L180 CM CRE BALLOON DILATATION L8 (Balloon) IMPLANT
CATHETER OD6 FR ODSEC12-13.5-15 MM L180 CM CREâ„¢ BALLOON DILATATION L8 (Balloon) IMPLANT
CLIP QUICKCLIP2 GI FIXATN LONG (Clip) IMPLANT
DEVICE MEASURING OTW MEASURE MIC-KEY* (Prep) IMPLANT
DEVICE MSR MCKY LF STRL OTW MSR DISP (Prep)
DILATOR ESCP PEBAX 2.8MM CRE 15-16.5-18 (Dilator)
DILATOR ESCP PEBAX CRE 6FR 12-13.5-15MM (Balloon) IMPLANT
FORCEPS BIOPSY L240 CM JUMBO MICROMESH (Instrument)
FORCEPS BIOPSY L240 CM JUMBO MICROMESH TEETH STREAMLINE CATHETER (Instrument) IMPLANT
FORCEPS BIOPSY L240 CM LARGE CAPACITY (Instrument)
FORCEPS BIOPSY L240 CM MICROMESH TEETH STREAMLINE CATHETER NEEDLE (Instrument) IMPLANT
FORCEPS BIOPSY L240 CM STANDARD CAPACITY (Instrument)
FORCEPS BX SS JMB RJ 4 2.8MM 240CM STRL (Instrument)
FORCEPS BX SS LG CPC RJ 4 2.4MM 240CM (Instrument)
FORCEPS BX STD CPC RJ 4 2.2MM 240CM STRL (Instrument)
GOWN CHEMOTHERAPY L47 IN X W28 IN UNIVERSAL OPEN BACK OVERHEAD KNIT (Gown) ×2 IMPLANT
GOWN CHMO PP PE UNV 47X28IN LF OPN BCK (Gown) ×4
KIT UNIVERSAL IRRIGATION SOL (Kits) ×2 IMPLANT
NEEDLE CARR-LOCKE INJECT 25GX5 (Needles) IMPLANT
NET SPEC RTRVL STD RTHNT 2.5MM 230CM LF (Urology Supply)
NET SPEC RTRVL UNV RTHNT PLTN 2.5MM 230 (GE Lab Supplies) IMPLANT
NET SPECIMEN RETRIEVAL L230 CM STANDARD (Urology Supply)
NET SPECIMEN RETRIEVAL L230 CM STANDARD SHEATH OD2.5 MM L6 CM X W3 CM (Urology Supply) IMPLANT
OVERTUBE ENDOSCOPIC L25 CM STANDARD (Endoscopic Supplies)
OVERTUBE ENDOSCOPIC L25 CM STANDARD TAPER INSUFFLATION CAP OD19.5 MM (Endoscopic Supplies) IMPLANT
OVERTUBE ESCP STD TPR GUARDUS 19.5MM (Endoscopic Supplies)
PAD ELECTROSRG GRND REM W CRD (Procedure Accessories) IMPLANT
PROBE COAG FIAPC 6.9FR 7.2FT CRCMF PLG (Blade)
PROBE COAGULATION L7.2 FT (Blade)
PROBE COAGULATION L7.2 FT CIRCUMFERENTIAL PLUG PLAY FUNCTIONALITY (Blade) IMPLANT
PROBE ELECTROSURGICAL L220 CM FLEXIBLE (Procedure Accessories)
PROBE ELECTROSURGICAL L220 CM FLEXIBLE STRAIGHT FIRE OD2.3 MM FIAPC (Procedure Accessories) IMPLANT
PROBE ESURG FIAPC 2.3MM 220CM STRL FLXB (Procedure Accessories)
SNARE ELECTRO SURG 20MM (Procedure Accessories) IMPLANT
SOLN LUBRICATING JELLY 4.25OZ (Irrigation Solutions) ×2 IMPLANT
SPONGE GAUZE L4 IN X W4 IN 16 PLY (Dressing) ×1
SPONGE GAUZE L4 IN X W4 IN 16 PLY MAXIMUM ABSORBENT USP TYPE VII (Dressing) ×1 IMPLANT
SPONGE GZE CTTN CRTY 4X4IN LF NS 16 PLY (Dressing) ×1
SYRINGE 50 ML GRADUATE NONPYROGENIC DEHP (Syringes, Needles)
SYRINGE 50 ML GRADUATE NONPYROGENIC DEHP FREE PVC FREE BD MEDICAL (Syringes, Needles) IMPLANT
SYRINGE INFL 60ML ALN II STRL GA DISP (Syringes, Needles)
SYRINGE INFLATION 60 ML GAUGE CRE (Syringes, Needles) IMPLANT
SYRINGE MED 50ML LF STRL GRAD N-PYRG (Syringes, Needles)
TUBE GSTRM STRG EVV 24FR REPL BLN STD PR (Tube) IMPLANT
TUBE OD24 FR STRAIGHT ENDOVIVE (Tube) IMPLANT
TUBE OD24 FR STRAIGHT ENDOVIVE REPLACEMENT BALLOON STANDARD PROFILE (Tube) IMPLANT
TUBE OD24 FR STRAIGHT ENDOVIVEâ„¢ REPLACEMENT BALLOON STANDARD PROFILE (Tube) IMPLANT
WATER STERILE PLASTIC POUR BOTTLE 1000 (Irrigation Solutions) ×1
WATER STERILE PLASTIC POUR BOTTLE 1000 ML (Irrigation Solutions) ×1 IMPLANT
WATER STERILE PLASTIC POUR BOTTLE 250 ML (Irrigation Solutions) ×1 IMPLANT
WATER STRL 1000ML LF PLS PR BTL (Irrigation Solutions) ×1
WATER STRL 250ML LF PLS PR BTL (Irrigation Solutions) ×1

## 2014-07-28 NOTE — Anesthesia Postprocedure Evaluation (Signed)
Anesthesia Post Evaluation    Patient: [name]    Procedures performed: Procedure(s):  EGD    Anesthesia type: General TIVA    Patient location:Phase I PACU    Last vitals:   Filed Vitals:    07/28/14 1420   BP: 145/80   Pulse: 68   Temp: 36.4 C (97.5 F)   Resp: 18   SpO2: 96%       Post pain: Patient not complaining of pain, continue current therapy      Mental Status:awake    Respiratory Function: tolerating room air    Cardiovascular: stable    Nausea/Vomiting: patient not complaining of nausea or vomiting    Hydration Status: adequate    Post assessment: no apparent anesthetic complications

## 2014-07-28 NOTE — Discharge Instructions (Signed)
Post Anesthesia Discharge Instructions    Although you may be awake and alert in the recovery room, small amounts of anesthetic remain in your system for about 24 hours.  You may feel tired and sleepy during this time.      You are advised to go directly home from the hospital.    Plan to stay at home and rest for the remainder of the day.    It is advisable to have someone with you at home for 24 hours after surgery.    Do not operate a motor vehicle, or any mechanical or electrical equipment for the next 24 hours.      Be careful when you are walking around, you may become dizzy.  The effects of anesthesia and/or medications are still present and drowsiness may occur    Do not consume alcohol, tranquilizers, sleeping medications, or any other non prescribed medication for the remainder of the day.    Diet:  begin with liquids, progress your diet as tolerated or as directed by your surgeon.  Nausea and vomiting may occur in the next 24 hours.    Upper GI Endoscopy  Upper GI endoscopy allows your doctor to look directly into the beginning of your gastrointestinal (GI) tract. The esophagus, stomach, and duodenum (the first part of the small intestine) make up the upper GI tract.    Before the Exam  Follow these and any other instructions you are given before your endoscopy. If you don't follow the doctor's instructions carefully, the test may need to be cancelled or done over:   Do not eat or drink anything after midnight the night before your exam. If your exam is in the afternoon, drink only clear liquids in the morning, and do not eat or drink anything for8 hours before the exam.   Bring your X-rays and any other test results you have.   Because you will be sedated, arrange for an adult to drive you home after the exam.   Tell your health care provider before the exam if you are taking any medications or have any medical problems.  The Procedure   You will be asked to lie on the endoscopy table.   Your  throat may be numbed with a spray or gargle. You are given medication through an intravenous (IV) line that will help you relax and remain comfortable. You may be awake or asleep during the procedure.   The doctor will inset the endoscope into your mouth and down your esophagus.Itis thinner than most pieces of food that you swallow. It will not affect your breathing. The medication helps keep you from gagging.   Air is inserted to expand your GI tract. It can make you burp.   The endoscope carries images of your upper GI tract to a video screen. If you are awake, you may be able to look at the images.   After the procedure is done, you will rest for a time. An adult must drive you home.     [address] [employer] [address], [place], [place] [address]. All rights reserved. This information is not intended as a substitute for professional medical care. Always follow your healthcare professional's instructions.

## 2014-07-28 NOTE — Progress Notes (Signed)
GI:  Esophagitis and several gastric ulcers, bleeding.  Plan:  1. Max PPI and sucralfate  2. Careful with ASA-specific way to take it  3. Breath test as outpt for H. Pylori.    Will follow as outpt.    Karl Ito, M.D.

## 2014-07-28 NOTE — Progress Notes (Signed)
GI:  Patient examined and she needs an EGD because of nausea and vomiting. She is essentially unchanged from her exam noted in the Epic record of 07/10/14 and it is safe to proceed with the procedure.    Karl Ito, M.D.

## 2014-07-28 NOTE — Transfer of Care (Signed)
Anesthesia Transfer of Care Note    Patient: [name]    Procedures performed: Procedure(s):  EGD    Anesthesia type: General TIVA    Patient location:Phase I PACU    Last vitals:   Filed Vitals:    07/28/14 1420   BP: 145/80   Pulse: 68   Temp: 36.4 C (97.5 F)   Resp: 18   SpO2: 96%       Post pain: Patient not complaining of pain, continue current therapy      Mental Status:awake    Respiratory Function: tolerating nasal cannula    Cardiovascular: stable    Nausea/Vomiting: patient not complaining of nausea or vomiting    Hydration Status: adequate    Post assessment: no apparent anesthetic complications

## 2014-07-28 NOTE — Anesthesia Preprocedure Evaluation (Signed)
Anesthesia Evaluation    AIRWAY    Mallampati: II    TM distance: >3 FB  Neck ROM: full  Mouth Opening:full   CARDIOVASCULAR    cardiovascular exam normal       DENTAL    no notable dental hx     PULMONARY    pulmonary exam normal     OTHER FINDINGS                      Anesthesia Plan    ASA 3     general                                 informed consent obtained

## 2014-07-29 ENCOUNTER — Encounter: Payer: Self-pay | Admitting: Gastroenterology

## 2014-08-04 ENCOUNTER — Ambulatory Visit: Payer: No Typology Code available for payment source | Attending: Plastic Surgery

## 2014-08-04 DIAGNOSIS — Z7401 Bed confinement status: Secondary | ICD-10-CM | POA: Insufficient documentation

## 2014-08-04 DIAGNOSIS — T8189XA Other complications of procedures, not elsewhere classified, initial encounter: Secondary | ICD-10-CM | POA: Insufficient documentation

## 2014-08-04 DIAGNOSIS — T8189XD Other complications of procedures, not elsewhere classified, subsequent encounter: Secondary | ICD-10-CM

## 2014-08-04 DIAGNOSIS — I10 Essential (primary) hypertension: Secondary | ICD-10-CM | POA: Insufficient documentation

## 2014-08-04 DIAGNOSIS — Y834 Other reconstructive surgery as the cause of abnormal reaction of the patient, or of later complication, without mention of misadventure at the time of the procedure: Secondary | ICD-10-CM | POA: Insufficient documentation

## 2014-08-04 DIAGNOSIS — J449 Chronic obstructive pulmonary disease, unspecified: Secondary | ICD-10-CM | POA: Insufficient documentation

## 2014-08-11 ENCOUNTER — Ambulatory Visit: Payer: No Typology Code available for payment source | Attending: Plastic Surgery

## 2014-08-11 DIAGNOSIS — T8189XD Other complications of procedures, not elsewhere classified, subsequent encounter: Secondary | ICD-10-CM

## 2014-08-11 DIAGNOSIS — M1990 Unspecified osteoarthritis, unspecified site: Secondary | ICD-10-CM | POA: Insufficient documentation

## 2014-08-11 DIAGNOSIS — I10 Essential (primary) hypertension: Secondary | ICD-10-CM | POA: Insufficient documentation

## 2014-08-11 DIAGNOSIS — J449 Chronic obstructive pulmonary disease, unspecified: Secondary | ICD-10-CM | POA: Insufficient documentation

## 2014-08-11 DIAGNOSIS — T8189XA Other complications of procedures, not elsewhere classified, initial encounter: Secondary | ICD-10-CM | POA: Insufficient documentation

## 2014-08-11 DIAGNOSIS — Y834 Other reconstructive surgery as the cause of abnormal reaction of the patient, or of later complication, without mention of misadventure at the time of the procedure: Secondary | ICD-10-CM | POA: Insufficient documentation

## 2014-08-11 DIAGNOSIS — Z7401 Bed confinement status: Secondary | ICD-10-CM | POA: Insufficient documentation

## 2014-08-25 ENCOUNTER — Ambulatory Visit: Payer: No Typology Code available for payment source | Attending: Cardiovascular Disease

## 2014-08-25 DIAGNOSIS — M1990 Unspecified osteoarthritis, unspecified site: Secondary | ICD-10-CM | POA: Insufficient documentation

## 2014-08-25 DIAGNOSIS — T8189XD Other complications of procedures, not elsewhere classified, subsequent encounter: Secondary | ICD-10-CM

## 2014-08-25 DIAGNOSIS — Z7401 Bed confinement status: Secondary | ICD-10-CM | POA: Insufficient documentation

## 2014-08-25 DIAGNOSIS — I10 Essential (primary) hypertension: Secondary | ICD-10-CM | POA: Insufficient documentation

## 2014-08-25 DIAGNOSIS — Z09 Encounter for follow-up examination after completed treatment for conditions other than malignant neoplasm: Secondary | ICD-10-CM | POA: Insufficient documentation

## 2014-09-15 ENCOUNTER — Ambulatory Visit: Payer: No Typology Code available for payment source | Attending: Cardiovascular Disease

## 2014-09-15 DIAGNOSIS — M1990 Unspecified osteoarthritis, unspecified site: Secondary | ICD-10-CM | POA: Insufficient documentation

## 2014-09-15 DIAGNOSIS — J449 Chronic obstructive pulmonary disease, unspecified: Secondary | ICD-10-CM | POA: Insufficient documentation

## 2014-09-15 DIAGNOSIS — T8189XA Other complications of procedures, not elsewhere classified, initial encounter: Secondary | ICD-10-CM | POA: Insufficient documentation

## 2014-09-15 DIAGNOSIS — Y834 Other reconstructive surgery as the cause of abnormal reaction of the patient, or of later complication, without mention of misadventure at the time of the procedure: Secondary | ICD-10-CM | POA: Insufficient documentation

## 2014-09-15 DIAGNOSIS — Z7401 Bed confinement status: Secondary | ICD-10-CM | POA: Insufficient documentation

## 2014-09-15 DIAGNOSIS — I10 Essential (primary) hypertension: Secondary | ICD-10-CM | POA: Insufficient documentation

## 2014-09-15 DIAGNOSIS — T8189XD Other complications of procedures, not elsewhere classified, subsequent encounter: Secondary | ICD-10-CM

## 2014-09-29 ENCOUNTER — Ambulatory Visit: Payer: No Typology Code available for payment source | Attending: Cardiovascular Disease

## 2014-09-29 DIAGNOSIS — Z7401 Bed confinement status: Secondary | ICD-10-CM | POA: Insufficient documentation

## 2014-09-29 DIAGNOSIS — J449 Chronic obstructive pulmonary disease, unspecified: Secondary | ICD-10-CM | POA: Insufficient documentation

## 2014-09-29 DIAGNOSIS — Y834 Other reconstructive surgery as the cause of abnormal reaction of the patient, or of later complication, without mention of misadventure at the time of the procedure: Secondary | ICD-10-CM | POA: Insufficient documentation

## 2014-09-29 DIAGNOSIS — I10 Essential (primary) hypertension: Secondary | ICD-10-CM | POA: Insufficient documentation

## 2014-09-29 DIAGNOSIS — T8189XD Other complications of procedures, not elsewhere classified, subsequent encounter: Secondary | ICD-10-CM

## 2014-09-29 DIAGNOSIS — T8189XA Other complications of procedures, not elsewhere classified, initial encounter: Secondary | ICD-10-CM | POA: Insufficient documentation

## 2014-09-29 DIAGNOSIS — M1990 Unspecified osteoarthritis, unspecified site: Secondary | ICD-10-CM | POA: Insufficient documentation

## 2014-10-06 ENCOUNTER — Ambulatory Visit: Payer: No Typology Code available for payment source | Attending: Cardiovascular Disease

## 2014-10-06 DIAGNOSIS — T8189XA Other complications of procedures, not elsewhere classified, initial encounter: Secondary | ICD-10-CM | POA: Insufficient documentation

## 2014-10-06 DIAGNOSIS — Y834 Other reconstructive surgery as the cause of abnormal reaction of the patient, or of later complication, without mention of misadventure at the time of the procedure: Secondary | ICD-10-CM | POA: Insufficient documentation

## 2014-10-06 DIAGNOSIS — T8189XD Other complications of procedures, not elsewhere classified, subsequent encounter: Secondary | ICD-10-CM

## 2014-10-06 DIAGNOSIS — Z7401 Bed confinement status: Secondary | ICD-10-CM | POA: Insufficient documentation

## 2014-10-06 DIAGNOSIS — I10 Essential (primary) hypertension: Secondary | ICD-10-CM | POA: Insufficient documentation

## 2014-10-06 DIAGNOSIS — M1990 Unspecified osteoarthritis, unspecified site: Secondary | ICD-10-CM | POA: Insufficient documentation

## 2014-10-06 DIAGNOSIS — J449 Chronic obstructive pulmonary disease, unspecified: Secondary | ICD-10-CM | POA: Insufficient documentation

## 2014-10-13 ENCOUNTER — Ambulatory Visit: Payer: No Typology Code available for payment source | Attending: Cardiovascular Disease

## 2014-10-13 DIAGNOSIS — I10 Essential (primary) hypertension: Secondary | ICD-10-CM | POA: Insufficient documentation

## 2014-10-13 DIAGNOSIS — Z96651 Presence of right artificial knee joint: Secondary | ICD-10-CM | POA: Insufficient documentation

## 2014-10-13 DIAGNOSIS — Z7401 Bed confinement status: Secondary | ICD-10-CM | POA: Insufficient documentation

## 2014-10-13 DIAGNOSIS — Y834 Other reconstructive surgery as the cause of abnormal reaction of the patient, or of later complication, without mention of misadventure at the time of the procedure: Secondary | ICD-10-CM | POA: Insufficient documentation

## 2014-10-13 DIAGNOSIS — M1990 Unspecified osteoarthritis, unspecified site: Secondary | ICD-10-CM | POA: Insufficient documentation

## 2014-10-13 DIAGNOSIS — J449 Chronic obstructive pulmonary disease, unspecified: Secondary | ICD-10-CM | POA: Insufficient documentation

## 2014-10-13 DIAGNOSIS — T8189XA Other complications of procedures, not elsewhere classified, initial encounter: Secondary | ICD-10-CM | POA: Insufficient documentation

## 2014-10-13 DIAGNOSIS — T8189XD Other complications of procedures, not elsewhere classified, subsequent encounter: Secondary | ICD-10-CM

## 2014-10-20 ENCOUNTER — Ambulatory Visit: Payer: No Typology Code available for payment source | Attending: Cardiovascular Disease

## 2014-10-20 DIAGNOSIS — M1990 Unspecified osteoarthritis, unspecified site: Secondary | ICD-10-CM | POA: Insufficient documentation

## 2014-10-20 DIAGNOSIS — J449 Chronic obstructive pulmonary disease, unspecified: Secondary | ICD-10-CM | POA: Insufficient documentation

## 2014-10-20 DIAGNOSIS — Y834 Other reconstructive surgery as the cause of abnormal reaction of the patient, or of later complication, without mention of misadventure at the time of the procedure: Secondary | ICD-10-CM | POA: Insufficient documentation

## 2014-10-20 DIAGNOSIS — T8189XA Other complications of procedures, not elsewhere classified, initial encounter: Secondary | ICD-10-CM | POA: Insufficient documentation

## 2014-10-20 DIAGNOSIS — I10 Essential (primary) hypertension: Secondary | ICD-10-CM | POA: Insufficient documentation

## 2014-10-20 DIAGNOSIS — T8189XD Other complications of procedures, not elsewhere classified, subsequent encounter: Secondary | ICD-10-CM

## 2014-10-20 DIAGNOSIS — Z7401 Bed confinement status: Secondary | ICD-10-CM | POA: Insufficient documentation

## 2014-11-03 ENCOUNTER — Ambulatory Visit: Payer: No Typology Code available for payment source

## 2014-11-17 ENCOUNTER — Ambulatory Visit: Payer: No Typology Code available for payment source | Attending: Cardiovascular Disease

## 2014-11-17 DIAGNOSIS — I10 Essential (primary) hypertension: Secondary | ICD-10-CM | POA: Insufficient documentation

## 2014-11-17 DIAGNOSIS — J449 Chronic obstructive pulmonary disease, unspecified: Secondary | ICD-10-CM | POA: Insufficient documentation

## 2014-11-17 DIAGNOSIS — M1990 Unspecified osteoarthritis, unspecified site: Secondary | ICD-10-CM | POA: Insufficient documentation

## 2014-11-17 DIAGNOSIS — T8189XD Other complications of procedures, not elsewhere classified, subsequent encounter: Secondary | ICD-10-CM

## 2014-11-17 DIAGNOSIS — Y834 Other reconstructive surgery as the cause of abnormal reaction of the patient, or of later complication, without mention of misadventure at the time of the procedure: Secondary | ICD-10-CM | POA: Insufficient documentation

## 2014-11-17 DIAGNOSIS — T8189XA Other complications of procedures, not elsewhere classified, initial encounter: Secondary | ICD-10-CM | POA: Insufficient documentation

## 2014-11-17 DIAGNOSIS — Z7401 Bed confinement status: Secondary | ICD-10-CM | POA: Insufficient documentation

## 2014-12-01 ENCOUNTER — Ambulatory Visit: Payer: No Typology Code available for payment source | Attending: Cardiovascular Disease

## 2014-12-01 DIAGNOSIS — J449 Chronic obstructive pulmonary disease, unspecified: Secondary | ICD-10-CM | POA: Insufficient documentation

## 2014-12-01 DIAGNOSIS — M1990 Unspecified osteoarthritis, unspecified site: Secondary | ICD-10-CM | POA: Insufficient documentation

## 2014-12-01 DIAGNOSIS — T8189XA Other complications of procedures, not elsewhere classified, initial encounter: Secondary | ICD-10-CM | POA: Insufficient documentation

## 2014-12-01 DIAGNOSIS — Z7401 Bed confinement status: Secondary | ICD-10-CM | POA: Insufficient documentation

## 2014-12-01 DIAGNOSIS — T8189XD Other complications of procedures, not elsewhere classified, subsequent encounter: Secondary | ICD-10-CM

## 2014-12-01 DIAGNOSIS — I10 Essential (primary) hypertension: Secondary | ICD-10-CM | POA: Insufficient documentation

## 2014-12-01 DIAGNOSIS — Y834 Other reconstructive surgery as the cause of abnormal reaction of the patient, or of later complication, without mention of misadventure at the time of the procedure: Secondary | ICD-10-CM | POA: Insufficient documentation

## 2014-12-08 ENCOUNTER — Ambulatory Visit: Payer: No Typology Code available for payment source | Attending: Cardiovascular Disease

## 2014-12-08 DIAGNOSIS — Z7401 Bed confinement status: Secondary | ICD-10-CM | POA: Insufficient documentation

## 2014-12-08 DIAGNOSIS — J449 Chronic obstructive pulmonary disease, unspecified: Secondary | ICD-10-CM | POA: Insufficient documentation

## 2014-12-08 DIAGNOSIS — M1990 Unspecified osteoarthritis, unspecified site: Secondary | ICD-10-CM | POA: Insufficient documentation

## 2014-12-08 DIAGNOSIS — Y834 Other reconstructive surgery as the cause of abnormal reaction of the patient, or of later complication, without mention of misadventure at the time of the procedure: Secondary | ICD-10-CM | POA: Insufficient documentation

## 2014-12-08 DIAGNOSIS — T8189XA Other complications of procedures, not elsewhere classified, initial encounter: Secondary | ICD-10-CM | POA: Insufficient documentation

## 2014-12-08 DIAGNOSIS — T8189XD Other complications of procedures, not elsewhere classified, subsequent encounter: Secondary | ICD-10-CM

## 2014-12-08 DIAGNOSIS — L98499 Non-pressure chronic ulcer of skin of other sites with unspecified severity: Secondary | ICD-10-CM

## 2014-12-08 DIAGNOSIS — I10 Essential (primary) hypertension: Secondary | ICD-10-CM | POA: Insufficient documentation

## 2014-12-15 ENCOUNTER — Ambulatory Visit: Payer: No Typology Code available for payment source | Attending: Cardiovascular Disease

## 2014-12-15 DIAGNOSIS — I10 Essential (primary) hypertension: Secondary | ICD-10-CM | POA: Insufficient documentation

## 2014-12-15 DIAGNOSIS — Z7401 Bed confinement status: Secondary | ICD-10-CM | POA: Insufficient documentation

## 2014-12-15 DIAGNOSIS — M1990 Unspecified osteoarthritis, unspecified site: Secondary | ICD-10-CM | POA: Insufficient documentation

## 2014-12-15 DIAGNOSIS — T8189XD Other complications of procedures, not elsewhere classified, subsequent encounter: Secondary | ICD-10-CM

## 2014-12-15 DIAGNOSIS — T8189XA Other complications of procedures, not elsewhere classified, initial encounter: Secondary | ICD-10-CM | POA: Insufficient documentation

## 2014-12-15 DIAGNOSIS — J449 Chronic obstructive pulmonary disease, unspecified: Secondary | ICD-10-CM | POA: Insufficient documentation

## 2014-12-15 DIAGNOSIS — Y834 Other reconstructive surgery as the cause of abnormal reaction of the patient, or of later complication, without mention of misadventure at the time of the procedure: Secondary | ICD-10-CM | POA: Insufficient documentation

## 2014-12-22 ENCOUNTER — Ambulatory Visit: Payer: No Typology Code available for payment source | Attending: Cardiovascular Disease

## 2014-12-22 DIAGNOSIS — J449 Chronic obstructive pulmonary disease, unspecified: Secondary | ICD-10-CM | POA: Insufficient documentation

## 2014-12-22 DIAGNOSIS — T8189XA Other complications of procedures, not elsewhere classified, initial encounter: Secondary | ICD-10-CM | POA: Insufficient documentation

## 2014-12-22 DIAGNOSIS — Z7401 Bed confinement status: Secondary | ICD-10-CM | POA: Insufficient documentation

## 2014-12-22 DIAGNOSIS — I10 Essential (primary) hypertension: Secondary | ICD-10-CM | POA: Insufficient documentation

## 2014-12-22 DIAGNOSIS — T8189XD Other complications of procedures, not elsewhere classified, subsequent encounter: Secondary | ICD-10-CM

## 2014-12-22 DIAGNOSIS — M1990 Unspecified osteoarthritis, unspecified site: Secondary | ICD-10-CM | POA: Insufficient documentation

## 2014-12-22 DIAGNOSIS — Y834 Other reconstructive surgery as the cause of abnormal reaction of the patient, or of later complication, without mention of misadventure at the time of the procedure: Secondary | ICD-10-CM | POA: Insufficient documentation

## 2014-12-29 ENCOUNTER — Ambulatory Visit: Payer: No Typology Code available for payment source | Attending: Plastic Surgery

## 2014-12-29 DIAGNOSIS — J449 Chronic obstructive pulmonary disease, unspecified: Secondary | ICD-10-CM | POA: Insufficient documentation

## 2014-12-29 DIAGNOSIS — Z7401 Bed confinement status: Secondary | ICD-10-CM | POA: Insufficient documentation

## 2014-12-29 DIAGNOSIS — M1990 Unspecified osteoarthritis, unspecified site: Secondary | ICD-10-CM | POA: Insufficient documentation

## 2014-12-29 DIAGNOSIS — T8189XA Other complications of procedures, not elsewhere classified, initial encounter: Secondary | ICD-10-CM | POA: Insufficient documentation

## 2014-12-29 DIAGNOSIS — I10 Essential (primary) hypertension: Secondary | ICD-10-CM | POA: Insufficient documentation

## 2014-12-29 DIAGNOSIS — Y834 Other reconstructive surgery as the cause of abnormal reaction of the patient, or of later complication, without mention of misadventure at the time of the procedure: Secondary | ICD-10-CM | POA: Insufficient documentation

## 2014-12-29 DIAGNOSIS — T8131XA Disruption of external operation (surgical) wound, not elsewhere classified, initial encounter: Secondary | ICD-10-CM | POA: Insufficient documentation

## 2014-12-29 DIAGNOSIS — T8189XD Other complications of procedures, not elsewhere classified, subsequent encounter: Secondary | ICD-10-CM

## 2015-01-12 ENCOUNTER — Ambulatory Visit: Payer: No Typology Code available for payment source | Attending: Cardiovascular Disease

## 2015-01-12 DIAGNOSIS — M1990 Unspecified osteoarthritis, unspecified site: Secondary | ICD-10-CM | POA: Insufficient documentation

## 2015-01-12 DIAGNOSIS — Z7401 Bed confinement status: Secondary | ICD-10-CM | POA: Insufficient documentation

## 2015-01-12 DIAGNOSIS — T8189XA Other complications of procedures, not elsewhere classified, initial encounter: Secondary | ICD-10-CM | POA: Insufficient documentation

## 2015-01-12 DIAGNOSIS — J449 Chronic obstructive pulmonary disease, unspecified: Secondary | ICD-10-CM | POA: Insufficient documentation

## 2015-01-12 DIAGNOSIS — I10 Essential (primary) hypertension: Secondary | ICD-10-CM | POA: Insufficient documentation

## 2015-01-12 DIAGNOSIS — Y838 Other surgical procedures as the cause of abnormal reaction of the patient, or of later complication, without mention of misadventure at the time of the procedure: Secondary | ICD-10-CM | POA: Insufficient documentation

## 2015-01-12 DIAGNOSIS — T8189XD Other complications of procedures, not elsewhere classified, subsequent encounter: Secondary | ICD-10-CM

## 2015-01-26 ENCOUNTER — Ambulatory Visit: Payer: No Typology Code available for payment source | Attending: Cardiovascular Disease

## 2015-01-26 DIAGNOSIS — J449 Chronic obstructive pulmonary disease, unspecified: Secondary | ICD-10-CM | POA: Insufficient documentation

## 2015-01-26 DIAGNOSIS — T8189XA Other complications of procedures, not elsewhere classified, initial encounter: Secondary | ICD-10-CM | POA: Insufficient documentation

## 2015-01-26 DIAGNOSIS — M1990 Unspecified osteoarthritis, unspecified site: Secondary | ICD-10-CM | POA: Insufficient documentation

## 2015-01-26 DIAGNOSIS — I10 Essential (primary) hypertension: Secondary | ICD-10-CM | POA: Insufficient documentation

## 2015-01-26 DIAGNOSIS — Z7401 Bed confinement status: Secondary | ICD-10-CM | POA: Insufficient documentation

## 2015-01-26 DIAGNOSIS — Y834 Other reconstructive surgery as the cause of abnormal reaction of the patient, or of later complication, without mention of misadventure at the time of the procedure: Secondary | ICD-10-CM | POA: Insufficient documentation

## 2015-01-26 DIAGNOSIS — T8189XD Other complications of procedures, not elsewhere classified, subsequent encounter: Secondary | ICD-10-CM

## 2015-02-09 ENCOUNTER — Ambulatory Visit: Payer: No Typology Code available for payment source

## 2015-02-16 ENCOUNTER — Ambulatory Visit: Payer: No Typology Code available for payment source | Attending: Cardiovascular Disease

## 2015-02-16 DIAGNOSIS — J449 Chronic obstructive pulmonary disease, unspecified: Secondary | ICD-10-CM | POA: Insufficient documentation

## 2015-02-16 DIAGNOSIS — T8189XD Other complications of procedures, not elsewhere classified, subsequent encounter: Secondary | ICD-10-CM

## 2015-02-16 DIAGNOSIS — Z09 Encounter for follow-up examination after completed treatment for conditions other than malignant neoplasm: Secondary | ICD-10-CM | POA: Insufficient documentation

## 2015-02-16 DIAGNOSIS — I10 Essential (primary) hypertension: Secondary | ICD-10-CM | POA: Insufficient documentation

## 2015-04-20 ENCOUNTER — Other Ambulatory Visit: Payer: Self-pay

## 2015-04-20 DIAGNOSIS — Z1231 Encounter for screening mammogram for malignant neoplasm of breast: Secondary | ICD-10-CM

## 2015-05-24 ENCOUNTER — Ambulatory Visit
Admission: RE | Admit: 2015-05-24 | Discharge: 2015-05-24 | Disposition: A | Discharge status: Home / Self Care | Payer: Medicare Other | Source: Ambulatory Visit

## 2015-05-24 DIAGNOSIS — Z1231 Encounter for screening mammogram for malignant neoplasm of breast: Secondary | ICD-10-CM

## 2015-08-18 ENCOUNTER — Encounter: Payer: Self-pay | Admitting: Vascular Surgery

## 2015-08-18 ENCOUNTER — Other Ambulatory Visit: Payer: Self-pay

## 2015-08-18 DIAGNOSIS — I6523 Occlusion and stenosis of bilateral carotid arteries: Secondary | ICD-10-CM

## 2015-10-15 ENCOUNTER — Encounter: Payer: Self-pay | Admitting: Vascular Surgery

## 2015-10-19 ENCOUNTER — Encounter (HOSPITAL_COMMUNITY): Payer: Medicare Other

## 2015-10-19 ENCOUNTER — Encounter: Payer: Medicare Other | Admitting: Vascular Surgery

## 2015-11-23 ENCOUNTER — Encounter: Payer: Self-pay | Admitting: Vascular Surgery

## 2015-11-30 ENCOUNTER — Encounter: Payer: Self-pay | Admitting: Vascular Surgery

## 2015-11-30 ENCOUNTER — Ambulatory Visit (HOSPITAL_COMMUNITY)
Admission: RE | Admit: 2015-11-30 | Discharge: 2015-11-30 | Disposition: A | Discharge status: Home / Self Care | Payer: Medicare Other | Source: Ambulatory Visit | Attending: Vascular Surgery | Admitting: Vascular Surgery

## 2015-11-30 ENCOUNTER — Ambulatory Visit (INDEPENDENT_AMBULATORY_CARE_PROVIDER_SITE_OTHER): Payer: Medicare Other | Admitting: Vascular Surgery

## 2015-11-30 VITALS — BP 163/69 | HR 55 | Ht 62.0 in | Wt 174.0 lb

## 2015-11-30 DIAGNOSIS — I6523 Occlusion and stenosis of bilateral carotid arteries: Secondary | ICD-10-CM

## 2015-11-30 DIAGNOSIS — E785 Hyperlipidemia, unspecified: Secondary | ICD-10-CM | POA: Diagnosis not present

## 2015-11-30 DIAGNOSIS — G458 Other transient cerebral ischemic attacks and related syndromes: Secondary | ICD-10-CM | POA: Diagnosis not present

## 2015-11-30 DIAGNOSIS — I10 Essential (primary) hypertension: Secondary | ICD-10-CM | POA: Diagnosis not present

## 2015-11-30 NOTE — Progress Notes (Signed)
[facility]  Patient name: Melanie [name] MRN: [id number] DOB: 06-30-1936 Sex: female  REASON FOR CONSULT: Hollow up of known asymptomatic carotid disease  HPI: Melanie [name] is a 80 y.o. female, who is in today for follow-up of asymptomatic carotid stenosis. She had been living in [place] and had been following this with serial ultrasounds. She has returned to live in [place] and establishing follow-up here. She is very pleasant healthy female with no prior history of neurologic deficit. She reports that the stenosis was found on physical exam noting a carotid bruit. She denies any cardiac disease.  Past Medical History  Diagnosis Date  . Uterine fibroid   . Hypertension   . COPD (chronic obstructive pulmonary disease) ([place])   . Hyperlipidemia     Family History  Problem Relation Age of Onset  . Heart disease Mother     before age 90    SOCIAL HISTORY: Social History   Social History  . Marital Status: Married    Spouse Name: N/A  . Number of Children: N/A  . Years of Education: N/A   Occupational History  . Not on file.   Social History Main Topics  . Smoking status: Former Smoker    Quit date: 09/08/1998  . Smokeless tobacco: Not on file  . Alcohol Use: 0.0 oz/week    0 Standard drinks or equivalent per week     Comment: occasional  . Drug Use: No  . Sexual Activity: Not on file   Other Topics Concern  . Not on file   Social History Narrative    Allergies  Allergen Reactions  . Nsaids     hypertension  . Ace Inhibitors Other (See Comments)    cough    Current Outpatient Prescriptions  Medication Sig Dispense Refill  . amLODipine (NORVASC) 10 MG tablet Take 0.5 mg by mouth daily.    Marland Kitchen aspirin 325 MG tablet Take 325 mg by mouth daily.    . cloNIDine (CATAPRES) 0.1 MG tablet Take 0.1 mg by mouth 2 (two) times daily.    . hydrochlorothiazide (HYDRODIURIL) 25 MG tablet Take 25 mg by mouth daily.    . Iron-Vitamin C  65-125 MG TABS Take by mouth.    . Multiple Vitamin (MULTIVITAMIN) tablet Take 1 tablet by mouth daily.    . nebivolol (BYSTOLIC) 5 MG tablet Take 5 mg by mouth daily.    . potassium chloride (K-DUR) 10 MEQ tablet Take 10 mEq by mouth daily.     No current facility-administered medications for this visit.    REVIEW OF SYSTEMS:  [X]  denotes positive finding, [ ]  denotes negative finding Cardiac  Comments:  Chest pain or chest pressure:    Shortness of breath upon exertion:    Short of breath when lying flat:    Irregular heart rhythm:        Vascular    Pain in calf, thigh, or hip brought on by ambulation: x  related to knee replacement   Pain in feet at night that wakes you up from your sleep:     Blood clot in your veins:    Leg swelling:         Pulmonary    Oxygen at home:    Productive cough:     Wheezing:         Neurologic    Sudden weakness in arms or legs:     Sudden numbness in arms or legs:  Sudden onset of difficulty speaking or slurred speech:    Temporary loss of vision in one eye:     Problems with dizziness:         Gastrointestinal    Blood in stool:  x   Vomited blood:         Genitourinary    Burning when urinating:     Blood in urine:        Psychiatric    Major depression:         Hematologic    Bleeding problems:    Problems with blood clotting too easily:        Skin    Rashes or ulcers:        Constitutional    Fever or chills:      PHYSICAL EXAM: Filed Vitals:   11/30/15 1010 11/30/15 1014  BP: 142/72 163/69  Pulse: 55   Height: 5\' 2"  (1.575 m)   Weight: 174 lb (78.926 kg)   SpO2: 100%     GENERAL: The patient is a well-nourished female, in no acute distress. The vital signs are documented above. CARDIAC: There is a regular rate and rhythm.  VASCULAR: He does have a right subclavian bruit. I do not appreciate a right carotid bruit. 2+ radial pulses bilaterally PULMONARY: There is good air exchange bilaterally without  wheezing or rales. ABDOMEN: Soft and non-tender with normal pitched bowel sounds.  MUSCULOSKELETAL: There are no major deformities or cyanosis. NEUROLOGIC: No focal weakness or paresthesias are detected. SKIN: There are no ulcers or rashes noted. PSYCHIATRIC: The patient has a normal affect.  DATA:  Carotid duplex reveals 40-59 stenosis in her right internal carotid artery and the less than 40% stenosis in her left internal carotid artery. She does have a subclavian artery stenosis with some bidirectional flow in her right vertebral artery  MEDICAL ISSUES: Stable overall. No evidence of critical carotid stenosis. Recommend that we see her on a yearly basis to rule out progression narrowing. Reviewed symptoms of carotid disease with her and she knows to notify us should this occur. Also expanded she needs to have her blood pressure check on her left arm since she does have right subclavian stenosis with approximately 20 oh meter mercury pressure difference lowering her right arm. We will see her again in one year with repeat carotid duplex   Eydan Chianese [facility] [employer]: [phone]

## 2015-11-30 NOTE — Addendum Note (Signed)
Addended by: Dorthula Rue L on: 11/30/2015 02:19 PM   Modules accepted: Orders

## 2016-04-27 ENCOUNTER — Other Ambulatory Visit: Payer: Self-pay | Admitting: Family Medicine

## 2016-04-27 DIAGNOSIS — Z1231 Encounter for screening mammogram for malignant neoplasm of breast: Secondary | ICD-10-CM

## 2016-05-24 ENCOUNTER — Ambulatory Visit
Admission: RE | Admit: 2016-05-24 | Discharge: 2016-05-24 | Disposition: A | Discharge status: Home / Self Care | Payer: Medicare Other | Source: Ambulatory Visit | Attending: Family Medicine | Admitting: Family Medicine

## 2016-05-24 DIAGNOSIS — Z1231 Encounter for screening mammogram for malignant neoplasm of breast: Secondary | ICD-10-CM

## 2016-10-20 ENCOUNTER — Other Ambulatory Visit (HOSPITAL_COMMUNITY): Payer: Self-pay | Admitting: Family Medicine

## 2016-10-20 ENCOUNTER — Ambulatory Visit (HOSPITAL_COMMUNITY)
Admission: RE | Admit: 2016-10-20 | Discharge: 2016-10-20 | Disposition: A | Discharge status: Home / Self Care | Payer: Medicare Other | Source: Ambulatory Visit | Attending: Family Medicine | Admitting: Family Medicine

## 2016-10-20 DIAGNOSIS — K8020 Calculus of gallbladder without cholecystitis without obstruction: Secondary | ICD-10-CM | POA: Diagnosis not present

## 2016-10-20 DIAGNOSIS — R1010 Upper abdominal pain, unspecified: Secondary | ICD-10-CM

## 2016-10-20 DIAGNOSIS — R110 Nausea: Secondary | ICD-10-CM

## 2016-10-20 DIAGNOSIS — R634 Abnormal weight loss: Secondary | ICD-10-CM

## 2016-10-28 ENCOUNTER — Emergency Department (HOSPITAL_COMMUNITY): Payer: Medicare Other

## 2016-10-28 ENCOUNTER — Inpatient Hospital Stay (HOSPITAL_COMMUNITY)
Admission: EM | Admit: 2016-10-28 | Discharge: 2016-12-09 | DRG: 417 | Disposition: A | Discharge status: Skilled Nursing Facility | Payer: Medicare Other | Attending: General Surgery | Admitting: General Surgery

## 2016-10-28 ENCOUNTER — Encounter (HOSPITAL_COMMUNITY): Payer: Self-pay | Admitting: Emergency Medicine

## 2016-10-28 DIAGNOSIS — R140 Abdominal distension (gaseous): Secondary | ICD-10-CM

## 2016-10-28 DIAGNOSIS — F419 Anxiety disorder, unspecified: Secondary | ICD-10-CM | POA: Diagnosis not present

## 2016-10-28 DIAGNOSIS — I701 Atherosclerosis of renal artery: Secondary | ICD-10-CM | POA: Diagnosis present

## 2016-10-28 DIAGNOSIS — Z9049 Acquired absence of other specified parts of digestive tract: Secondary | ICD-10-CM

## 2016-10-28 DIAGNOSIS — Z792 Long term (current) use of antibiotics: Secondary | ICD-10-CM

## 2016-10-28 DIAGNOSIS — M7989 Other specified soft tissue disorders: Secondary | ICD-10-CM | POA: Diagnosis not present

## 2016-10-28 DIAGNOSIS — Z7982 Long term (current) use of aspirin: Secondary | ICD-10-CM

## 2016-10-28 DIAGNOSIS — D72829 Elevated white blood cell count, unspecified: Secondary | ICD-10-CM | POA: Diagnosis not present

## 2016-10-28 DIAGNOSIS — I160 Hypertensive urgency: Secondary | ICD-10-CM | POA: Diagnosis not present

## 2016-10-28 DIAGNOSIS — K562 Volvulus: Secondary | ICD-10-CM | POA: Diagnosis not present

## 2016-10-28 DIAGNOSIS — A419 Sepsis, unspecified organism: Secondary | ICD-10-CM | POA: Diagnosis not present

## 2016-10-28 DIAGNOSIS — I110 Hypertensive heart disease with heart failure: Secondary | ICD-10-CM | POA: Diagnosis present

## 2016-10-28 DIAGNOSIS — Y831 Surgical operation with implant of artificial internal device as the cause of abnormal reaction of the patient, or of later complication, without mention of misadventure at the time of the procedure: Secondary | ICD-10-CM | POA: Diagnosis present

## 2016-10-28 DIAGNOSIS — K559 Vascular disorder of intestine, unspecified: Secondary | ICD-10-CM | POA: Diagnosis not present

## 2016-10-28 DIAGNOSIS — K9189 Other postprocedural complications and disorders of digestive system: Secondary | ICD-10-CM

## 2016-10-28 DIAGNOSIS — R1031 Right lower quadrant pain: Secondary | ICD-10-CM | POA: Diagnosis not present

## 2016-10-28 DIAGNOSIS — J449 Chronic obstructive pulmonary disease, unspecified: Secondary | ICD-10-CM | POA: Diagnosis present

## 2016-10-28 DIAGNOSIS — K810 Acute cholecystitis: Secondary | ICD-10-CM | POA: Diagnosis not present

## 2016-10-28 DIAGNOSIS — L89311 Pressure ulcer of right buttock, stage 1: Secondary | ICD-10-CM | POA: Diagnosis not present

## 2016-10-28 DIAGNOSIS — Z9889 Other specified postprocedural states: Secondary | ICD-10-CM

## 2016-10-28 DIAGNOSIS — Z8614 Personal history of Methicillin resistant Staphylococcus aureus infection: Secondary | ICD-10-CM | POA: Diagnosis not present

## 2016-10-28 DIAGNOSIS — E785 Hyperlipidemia, unspecified: Secondary | ICD-10-CM | POA: Diagnosis present

## 2016-10-28 DIAGNOSIS — I10 Essential (primary) hypertension: Secondary | ICD-10-CM | POA: Diagnosis present

## 2016-10-28 DIAGNOSIS — Z87891 Personal history of nicotine dependence: Secondary | ICD-10-CM

## 2016-10-28 DIAGNOSIS — R1084 Generalized abdominal pain: Secondary | ICD-10-CM | POA: Diagnosis present

## 2016-10-28 DIAGNOSIS — G8918 Other acute postprocedural pain: Secondary | ICD-10-CM

## 2016-10-28 DIAGNOSIS — K8000 Calculus of gallbladder with acute cholecystitis without obstruction: Secondary | ICD-10-CM | POA: Diagnosis present

## 2016-10-28 DIAGNOSIS — R197 Diarrhea, unspecified: Secondary | ICD-10-CM | POA: Diagnosis not present

## 2016-10-28 DIAGNOSIS — L8990 Pressure ulcer of unspecified site, unspecified stage: Secondary | ICD-10-CM | POA: Insufficient documentation

## 2016-10-28 DIAGNOSIS — R52 Pain, unspecified: Secondary | ICD-10-CM

## 2016-10-28 DIAGNOSIS — R1011 Right upper quadrant pain: Secondary | ICD-10-CM | POA: Diagnosis not present

## 2016-10-28 DIAGNOSIS — K224 Dyskinesia of esophagus: Secondary | ICD-10-CM | POA: Diagnosis not present

## 2016-10-28 DIAGNOSIS — E876 Hypokalemia: Secondary | ICD-10-CM | POA: Diagnosis not present

## 2016-10-28 DIAGNOSIS — K8051 Calculus of bile duct without cholangitis or cholecystitis with obstruction: Secondary | ICD-10-CM | POA: Diagnosis present

## 2016-10-28 DIAGNOSIS — L89321 Pressure ulcer of left buttock, stage 1: Secondary | ICD-10-CM | POA: Diagnosis not present

## 2016-10-28 DIAGNOSIS — R072 Precordial pain: Secondary | ICD-10-CM | POA: Diagnosis not present

## 2016-10-28 DIAGNOSIS — K8021 Calculus of gallbladder without cholecystitis with obstruction: Secondary | ICD-10-CM | POA: Diagnosis not present

## 2016-10-28 DIAGNOSIS — Z79899 Other long term (current) drug therapy: Secondary | ICD-10-CM

## 2016-10-28 DIAGNOSIS — I472 Ventricular tachycardia: Secondary | ICD-10-CM | POA: Diagnosis not present

## 2016-10-28 DIAGNOSIS — D509 Iron deficiency anemia, unspecified: Secondary | ICD-10-CM | POA: Diagnosis not present

## 2016-10-28 DIAGNOSIS — T8453XD Infection and inflammatory reaction due to internal right knee prosthesis, subsequent encounter: Secondary | ICD-10-CM | POA: Diagnosis not present

## 2016-10-28 DIAGNOSIS — Z419 Encounter for procedure for purposes other than remedying health state, unspecified: Secondary | ICD-10-CM

## 2016-10-28 DIAGNOSIS — N179 Acute kidney failure, unspecified: Secondary | ICD-10-CM | POA: Diagnosis present

## 2016-10-28 DIAGNOSIS — K838 Other specified diseases of biliary tract: Secondary | ICD-10-CM

## 2016-10-28 DIAGNOSIS — R0600 Dyspnea, unspecified: Secondary | ICD-10-CM

## 2016-10-28 DIAGNOSIS — K565 Intestinal adhesions [bands], unspecified as to partial versus complete obstruction: Secondary | ICD-10-CM | POA: Diagnosis not present

## 2016-10-28 DIAGNOSIS — R109 Unspecified abdominal pain: Secondary | ICD-10-CM

## 2016-10-28 DIAGNOSIS — K567 Ileus, unspecified: Secondary | ICD-10-CM | POA: Diagnosis not present

## 2016-10-28 DIAGNOSIS — Z6833 Body mass index (BMI) 33.0-33.9, adult: Secondary | ICD-10-CM

## 2016-10-28 DIAGNOSIS — I5032 Chronic diastolic (congestive) heart failure: Secondary | ICD-10-CM | POA: Diagnosis present

## 2016-10-28 DIAGNOSIS — K8041 Calculus of bile duct with cholecystitis, unspecified, with obstruction: Secondary | ICD-10-CM | POA: Diagnosis not present

## 2016-10-28 DIAGNOSIS — R079 Chest pain, unspecified: Secondary | ICD-10-CM | POA: Diagnosis not present

## 2016-10-28 DIAGNOSIS — E43 Unspecified severe protein-calorie malnutrition: Secondary | ICD-10-CM | POA: Diagnosis present

## 2016-10-28 DIAGNOSIS — K219 Gastro-esophageal reflux disease without esophagitis: Secondary | ICD-10-CM | POA: Diagnosis present

## 2016-10-28 DIAGNOSIS — R110 Nausea: Secondary | ICD-10-CM

## 2016-10-28 DIAGNOSIS — K653 Choleperitonitis: Secondary | ICD-10-CM | POA: Diagnosis present

## 2016-10-28 DIAGNOSIS — K8020 Calculus of gallbladder without cholecystitis without obstruction: Secondary | ICD-10-CM

## 2016-10-28 DIAGNOSIS — R5381 Other malaise: Secondary | ICD-10-CM | POA: Diagnosis not present

## 2016-10-28 DIAGNOSIS — K913 Postprocedural intestinal obstruction, unspecified as to partial versus complete: Secondary | ICD-10-CM | POA: Diagnosis not present

## 2016-10-28 DIAGNOSIS — K56609 Unspecified intestinal obstruction, unspecified as to partial versus complete obstruction: Secondary | ICD-10-CM

## 2016-10-28 DIAGNOSIS — Z4659 Encounter for fitting and adjustment of other gastrointestinal appliance and device: Secondary | ICD-10-CM

## 2016-10-28 HISTORY — DX: Anxiety disorder, unspecified: F41.9

## 2016-10-28 HISTORY — DX: Intestinal adhesions (bands), unspecified as to partial versus complete obstruction: K56.50

## 2016-10-28 LAB — COMPREHENSIVE METABOLIC PANEL
ALK PHOS: 61 U/L (ref 38–126)
ALT: 8 U/L — AB (ref 14–54)
AST: 43 U/L — AB (ref 15–41)
Albumin: 3.5 g/dL (ref 3.5–5.0)
Anion gap: 10 (ref 5–15)
BILIRUBIN TOTAL: 1.1 mg/dL (ref 0.3–1.2)
BUN: 18 mg/dL (ref 6–20)
CALCIUM: 9.3 mg/dL (ref 8.9–10.3)
CHLORIDE: 105 mmol/L (ref 101–111)
CO2: 26 mmol/L (ref 22–32)
CREATININE: 0.88 mg/dL (ref 0.44–1.00)
GFR calc Af Amer: >60 mL/min (ref >60)
GFR calc non Af Amer: >60 mL/min (ref >60)
Glucose, Bld: 108 mg/dL — ABNORMAL HIGH (ref 65–99)
Potassium: 5.2 mmol/L — ABNORMAL HIGH (ref 3.5–5.1)
Sodium: 141 mmol/L (ref 135–145)
Total Protein: 7.6 g/dL (ref 6.5–8.1)

## 2016-10-28 LAB — URINALYSIS, ROUTINE W REFLEX MICROSCOPIC
Bilirubin Urine: NEGATIVE
GLUCOSE, UA: NEGATIVE mg/dL
HGB URINE DIPSTICK: NEGATIVE
Ketones, ur: 20 mg/dL — AB
LEUKOCYTES UA: NEGATIVE
Nitrite: NEGATIVE
PH: 7 (ref 5.0–8.0)
Protein, ur: NEGATIVE mg/dL
Specific Gravity, Urine: 1.025 (ref 1.005–1.030)

## 2016-10-28 LAB — CBC WITH DIFFERENTIAL/PLATELET
BASOS ABS: 0 10*3/uL (ref 0.0–0.1)
Basophils Relative: 0 %
EOS PCT: 1 %
Eosinophils Absolute: 0.1 10*3/uL (ref 0.0–0.7)
HEMATOCRIT: 36.6 % (ref 36.0–46.0)
HEMOGLOBIN: 11.9 g/dL — AB (ref 12.0–15.0)
LYMPHS PCT: 9 %
Lymphs Abs: 1.4 10*3/uL (ref 0.7–4.0)
MCH: 27 pg (ref 26.0–34.0)
MCHC: 32.5 g/dL (ref 30.0–36.0)
MCV: 83.2 fL (ref 78.0–100.0)
Monocytes Absolute: 1.2 10*3/uL — ABNORMAL HIGH (ref 0.1–1.0)
Monocytes Relative: 7 %
NEUTROS ABS: 14.1 10*3/uL — AB (ref 1.7–7.7)
NEUTROS PCT: 83 %
PLATELETS: 256 10*3/uL (ref 150–400)
RBC: 4.4 MIL/uL (ref 3.87–5.11)
RDW: 14.9 % (ref 11.5–15.5)
WBC: 16.9 10*3/uL — ABNORMAL HIGH (ref 4.0–10.5)

## 2016-10-28 LAB — LIPASE, BLOOD: Lipase: 19 U/L (ref 11–51)

## 2016-10-28 LAB — I-STAT CG4 LACTIC ACID, ED: Lactic Acid, Venous: 1.28 mmol/L (ref 0.5–1.9)

## 2016-10-28 MED ORDER — ONDANSETRON HCL 4 MG/2ML IJ SOLN
4.0000 mg | Freq: Four times a day (QID) | INTRAMUSCULAR | Status: DC | PRN
  Administered 2016-10-29: 4 mg via INTRAVENOUS
  Filled 2016-10-28: qty 2

## 2016-10-28 MED ORDER — ENOXAPARIN SODIUM 40 MG/0.4ML ~~LOC~~ SOLN
40.0000 mg | SUBCUTANEOUS | Status: DC
  Administered 2016-10-28 – 2016-10-29 (×2): 40 mg via SUBCUTANEOUS
  Filled 2016-10-28 (×2): qty 0.4

## 2016-10-28 MED ORDER — ACETAMINOPHEN 325 MG PO TABS
650.0000 mg | ORAL_TABLET | Freq: Four times a day (QID) | ORAL | Status: DC | PRN
  Administered 2016-10-28: 650 mg via ORAL
  Filled 2016-10-28: qty 2

## 2016-10-28 MED ORDER — FENTANYL CITRATE (PF) 100 MCG/2ML IJ SOLN
50.0000 ug | Freq: Once | INTRAMUSCULAR | Status: AC
  Administered 2016-10-28: 50 ug via INTRAVENOUS
  Filled 2016-10-28: qty 2

## 2016-10-28 MED ORDER — NEBIVOLOL HCL 5 MG PO TABS
5.0000 mg | ORAL_TABLET | Freq: Every day | ORAL | Status: DC
  Administered 2016-10-28 – 2016-10-30 (×3): 5 mg via ORAL
  Filled 2016-10-28 (×3): qty 1

## 2016-10-28 MED ORDER — PANTOPRAZOLE SODIUM 40 MG PO TBEC
40.0000 mg | DELAYED_RELEASE_TABLET | Freq: Every day | ORAL | Status: DC
  Administered 2016-10-28 – 2016-10-30 (×3): 40 mg via ORAL
  Filled 2016-10-28 (×3): qty 1

## 2016-10-28 MED ORDER — ADULT MULTIVITAMIN W/MINERALS CH
1.0000 | ORAL_TABLET | Freq: Every day | ORAL | Status: DC
  Administered 2016-10-28 – 2016-10-30 (×3): 1 via ORAL
  Filled 2016-10-28 (×3): qty 1

## 2016-10-28 MED ORDER — DEXTROSE-NACL 5-0.9 % IV SOLN
INTRAVENOUS | Status: DC
  Administered 2016-10-28: 17:00:00 via INTRAVENOUS
  Administered 2016-10-29: 75 mL/h via INTRAVENOUS

## 2016-10-28 MED ORDER — HYDROMORPHONE HCL 2 MG/ML IJ SOLN
0.5000 mg | INTRAMUSCULAR | Status: DC | PRN
  Administered 2016-10-29 – 2016-10-30 (×6): 1 mg via INTRAVENOUS
  Filled 2016-10-28 (×7): qty 1

## 2016-10-28 MED ORDER — SODIUM CHLORIDE 0.9 % IV BOLUS (SEPSIS)
1000.0000 mL | Freq: Once | INTRAVENOUS | Status: AC
  Administered 2016-10-28: 1000 mL via INTRAVENOUS

## 2016-10-28 MED ORDER — ONE-DAILY MULTI VITAMINS PO TABS: 1.0000 | ORAL_TABLET | Freq: Every day | ORAL | Status: DC

## 2016-10-28 MED ORDER — ASPIRIN EC 81 MG PO TBEC
81.0000 mg | DELAYED_RELEASE_TABLET | Freq: Every day | ORAL | Status: DC
  Administered 2016-10-28 – 2016-10-30 (×3): 81 mg via ORAL
  Filled 2016-10-28 (×3): qty 1

## 2016-10-28 MED ORDER — ONDANSETRON HCL 4 MG PO TABS: 4.0000 mg | ORAL_TABLET | Freq: Four times a day (QID) | ORAL | Status: DC | PRN

## 2016-10-28 MED ORDER — IOPAMIDOL (ISOVUE-300) INJECTION 61%
INTRAVENOUS | Status: AC
Start: 2016-10-28 — End: 2016-10-28
  Filled 2016-10-28: qty 100

## 2016-10-28 MED ORDER — ACETAMINOPHEN 650 MG RE SUPP: 650.0000 mg | Freq: Four times a day (QID) | RECTAL | Status: DC | PRN

## 2016-10-28 MED ORDER — ONDANSETRON HCL 4 MG/2ML IJ SOLN
4.0000 mg | Freq: Once | INTRAMUSCULAR | Status: AC
  Administered 2016-10-28: 4 mg via INTRAVENOUS
  Filled 2016-10-28: qty 2

## 2016-10-28 MED ORDER — MORPHINE SULFATE (PF) 2 MG/ML IV SOLN
1.0000 mg | INTRAVENOUS | Status: DC | PRN
  Administered 2016-10-28: 1 mg via INTRAVENOUS
  Filled 2016-10-28: qty 1

## 2016-10-28 MED ORDER — IOPAMIDOL (ISOVUE-300) INJECTION 61%
100.0000 mL | Freq: Once | INTRAVENOUS | Status: AC | PRN
  Administered 2016-10-28: 100 mL via INTRAVENOUS

## 2016-10-28 MED ORDER — CLONIDINE HCL 0.1 MG PO TABS
0.1000 mg | ORAL_TABLET | Freq: Every day | ORAL | Status: DC
  Administered 2016-10-28 – 2016-10-30 (×3): 0.1 mg via ORAL
  Filled 2016-10-28 (×3): qty 1

## 2016-10-28 NOTE — ED Notes (Signed)
[place] at bedside.

## 2016-10-28 NOTE — H&P (Addendum)
History and Physical    AKELIA [name] [id number] DOB: 1936-05-10 DOA: 10/28/2016  PCP: Osborne Casco, MD   Patient coming from: Home   Chief Complaint: Abdominal pain.   HPI: Melanie [name] is a 81 y.o. female with medical history significant of gallstones presents to the hospital with the chief complaint of abdominal pain. She has been experiencing abdominal pain for the last 4 weeks, intermittent, localized in the right upper quadrant, she has been diagnosed with gallstones and she was scheduled to see surgery next week. Last night she had an acute exacerbation of her abdominal pain, localized in the right upper quadrant, epigastric region, colicky nature, improved with pain medication but persistent, associated with nausea but no vomiting, no diarrhea, no fevers or chills. Her pain seems to be worse with meals.   ED Course: Patient was found to be in pain, supportive supportive therapy was started, further workup showed dilatation of the distal common bile duct. Was referred for admission and further evaluation.   Review of Systems:  1. General. Fevers no chills, no waking a weight loss 2. ENT, no runny nose or sore throat 3. Pulmonary no shortness of breath cough or hemoptysis 4. Cardiovascular, no angina, no claudication, no PND orthopnea 5. Gastrointestinal. Positive for nausea, no vomiting, no diarrhea. Positive abdominal pain 6. Endocrine. No tremors, heat or cold intolerance 7. Hematology no easy bruisability or frequent infections 8. Urology: No dysuria or increased urinary frequency 9. Neurology. No seizures or paresthesias 10. Dermatology no rashes  Past Medical History:  Diagnosis Date  . COPD (chronic obstructive pulmonary disease) ([place])   . Hyperlipidemia   . Hypertension   . Uterine fibroid     Past Surgical History:  Procedure Laterality Date  . BACK SURGERY     X2  . JOINT REPLACEMENT     knee     reports that she quit smoking about 18 years ago.  She does not have any smokeless tobacco history on file. She reports that she drinks alcohol. She reports that she does not use drugs.  Allergies  Allergen Reactions  . Nsaids     hypertension  . Ace Inhibitors Other (See Comments)    cough    Family History  Problem Relation Age of Onset  . Heart disease Mother     before age 29   Unacceptable: Noncontributory, unremarkable, or negative. Acceptable: Family history reviewed and not pertinent (If you reviewed it)  Prior to Admission medications   Medication Sig Start Date End Date Taking? Authorizing Provider  amLODipine (NORVASC) 10 MG tablet Take 0.5 mg by mouth daily.   Yes Historical Provider, MD  amoxicillin (AMOXIL) 875 MG tablet Take 875 mg by mouth daily. 10/02/16  Yes Historical Provider, MD  aspirin EC 81 MG tablet Take 81 mg by mouth daily.   Yes Historical Provider, MD  cloNIDine (CATAPRES) 0.1 MG tablet Take 0.1 mg by mouth daily.    Yes Historical Provider, MD  hydrochlorothiazide (HYDRODIURIL) 25 MG tablet Take 25 mg by mouth daily.   Yes Historical Provider, MD  Multiple Vitamin (MULTIVITAMIN) tablet Take 1 tablet by mouth daily.   Yes Historical Provider, MD  nebivolol (BYSTOLIC) 5 MG tablet Take 5 mg by mouth daily.   Yes Historical Provider, MD  omeprazole (PRILOSEC) 40 MG capsule Take 40 mg by mouth daily.   Yes Historical Provider, MD  potassium chloride SA (K-DUR,KLOR-CON) 20 MEQ tablet Take 20 mEq by mouth 2 (two) times daily.   Yes  Historical Provider, MD  FLUZONE HIGH-DOSE 0.5 ML SUSY TO BE ADMINISTERED BY PHARMACIST FOR IMMUNIZATION 07/25/16   Historical Provider, MD    Physical Exam: Vitals:   10/28/16 0856 10/28/16 1045 10/28/16 1259 10/28/16 1451  BP: 183/62 167/64 158/68 (!) 162/44  Pulse: 65 66 65 69  Resp: 18 16 18 16   Temp:  97.8 F (36.6 C)  98.6 F (37 C)  TempSrc:  Oral  Oral  SpO2: 98% 100% 100% 97%  Weight:      Height:          Constitutional: deconditioned and ill looking  appering Vitals:   10/28/16 0856 10/28/16 1045 10/28/16 1259 10/28/16 1451  BP: 183/62 167/64 158/68 (!) 162/44  Pulse: 65 66 65 69  Resp: 18 16 18 16   Temp:  97.8 F (36.6 C)  98.6 F (37 C)  TempSrc:  Oral  Oral  SpO2: 98% 100% 100% 97%  Weight:      Height:       Eyes: PERRL, lids and conjunctivae pale with no icterus. \ Head normocephalic, nose and ears with no deformities.  ENMT: Mucous membranes are dry. Posterior pharynx clear of any exudate or lesions.Normal dentition.  Neck: normal, supple, no masses, no thyromegaly Respiratory: clear to auscultation bilaterally, no wheezing, no crackles. Normal respiratory effort. No accessory muscle use.  Cardiovascular: Regular rate and rhythm, no murmurs / rubs / gallops. No extremity edema. 2+ pedal pulses. No carotid bruits.  Abdomen: tender to deep palpation on the right upper quadrant and epigastric region, no rebound, negative murphy sign, no masses palpated. No hepatosplenomegaly. Bowel sounds positive.  Musculoskeletal: no clubbing / cyanosis. No joint deformity upper and lower extremities. Good ROM, no contractures. Normal muscle tone.  Skin: no rashes, lesions, ulcers. No induration Neurologic: CN 2-12 grossly intact. Sensation intact, DTR normal. Strength 5/5 in all 4.    Labs on Admission: I have personally reviewed following labs and imaging studies  CBC:  Recent Labs Lab 10/28/16 0745  WBC 16.9*  NEUTROABS 14.1*  HGB 11.9*  HCT 36.6  MCV 83.2  PLT 123456   Basic Metabolic Panel:  Recent Labs Lab 10/28/16 0745  NA 141  K 5.2*  CL 105  CO2 26  GLUCOSE 108*  BUN 18  CREATININE 0.88  CALCIUM 9.3   GFR: Estimated Creatinine Clearance: 49.6 mL/min (by C-G formula based on SCr of 0.88 mg/dL). Liver Function Tests:  Recent Labs Lab 10/28/16 0745  AST 43*  ALT 8*  ALKPHOS 61  BILITOT 1.1  PROT 7.6  ALBUMIN 3.5    Recent Labs Lab 10/28/16 0745  LIPASE 19   No results for input(s): AMMONIA in the  last 168 hours. Coagulation Profile: No results for input(s): INR, PROTIME in the last 168 hours. Cardiac Enzymes: No results for input(s): CKTOTAL, CKMB, CKMBINDEX, TROPONINI in the last 168 hours. BNP (last 3 results) No results for input(s): PROBNP in the last 8760 hours. HbA1C: No results for input(s): HGBA1C in the last 72 hours. CBG: No results for input(s): GLUCAP in the last 168 hours. Lipid Profile: No results for input(s): CHOL, HDL, LDLCALC, TRIG, CHOLHDL, LDLDIRECT in the last 72 hours. Thyroid Function Tests: No results for input(s): TSH, T4TOTAL, FREET4, T3FREE, THYROIDAB in the last 72 hours. Anemia Panel: No results for input(s): VITAMINB12, FOLATE, FERRITIN, TIBC, IRON, RETICCTPCT in the last 72 hours. Urine analysis:    Component Value Date/Time   COLORURINE YELLOW 10/28/2016 0940   APPEARANCEUR CLEAR 10/28/2016 0940  LABSPEC 1.025 10/28/2016 0940   PHURINE 7.0 10/28/2016 0940   GLUCOSEU NEGATIVE 10/28/2016 0940   HGBUR NEGATIVE 10/28/2016 0940   BILIRUBINUR NEGATIVE 10/28/2016 0940   KETONESUR 20 (A) 10/28/2016 0940   PROTEINUR NEGATIVE 10/28/2016 0940   NITRITE NEGATIVE 10/28/2016 0940   LEUKOCYTESUR NEGATIVE 10/28/2016 0940   Sepsis Labs: !!!!!!!!!!!!!!!!!!!!!!!!!!!!!!!!!!!!!!!!!!!! @LABRCNTIP (procalcitonin:4,lacticidven:4) )No results found for this or any previous visit (from the past 240 hour(s)).   Radiological Exams on Admission: Dg Chest 2 View  Result Date: 10/28/2016 CLINICAL DATA:  Right upper quadrant pain.  Known gallstones. EXAM: CHEST  2 VIEW COMPARISON:  08/20/2006 FINDINGS: Normal heart size. Lungs hyperaerated and clear. No pneumothorax. No pleural effusion. Lumbar fusion hardware is in place. IMPRESSION: No active cardiopulmonary disease. Electronically Signed   By: Marybelle Killings M.D.   On: 10/28/2016 08:14   US Abdomen Complete  Result Date: 10/28/2016 CLINICAL DATA:  Pain. EXAM: ABDOMEN ULTRASOUND COMPLETE COMPARISON:  CT scan from  earlier today and right upper quadrant ultrasound October 20, 2016 FINDINGS: Gallbladder: Sludge and layering stones seen in the gallbladder. No Murphy's sign is reported. No wall thickening or pericholecystic fluid. The gallbladder remains distended. Common bile duct: Diameter: The central common bile duct measures 7.1 mm which is stable. At the level of the pancreatic head, the common bile duct measures 11.3 mm which is distended. There may be some sludge in the gallbladder but no discrete stones are identified. Liver: No focal lesion identified. Within normal limits in parenchymal echogenicity. IVC: No abnormality visualized. Pancreas: Visualized portion unremarkable. Spleen: Size and appearance within normal limits. Right Kidney: Length: 9.6 cm. Echogenicity within normal limits. No mass or hydronephrosis visualized. Left Kidney: Length: 8.1 cm. Echogenicity within normal limits. No mass or hydronephrosis visualized. Abdominal aorta: No aneurysm visualized. Other findings: None. IMPRESSION: 1. Stones and sludge are seen in the gallbladder without wall thickening, pericholecystic fluid, or Murphy's sign. 2. The central common bile duct measures 7 mm which is stable in the interval. The distal common bile duct measures 11 mm which was not imaged previously. However, the 11 mm diameter in the distal duct is abnormal. No discrete stones are seen in the common bile duct but there appears to be sludge. An MRCP could better evaluate the common bile duct for underlying stone or obstruction. 3. No other acute abnormalities. Electronically Signed   By: Dorise Bullion III M.D   On: 10/28/2016 12:17   Ct Abdomen Pelvis W Contrast  Result Date: 10/28/2016 CLINICAL DATA:  Worsened right upper quadrant pain with nausea and vomiting. History of gallstones. EXAM: CT ABDOMEN AND PELVIS WITH CONTRAST TECHNIQUE: Multidetector CT imaging of the abdomen and pelvis was performed using the standard protocol following bolus  administration of intravenous contrast. CONTRAST:  14mL ISOVUE-300 IOPAMIDOL (ISOVUE-300) INJECTION 61% COMPARISON:  Ultrasound 10/20/2016.  CT 06/24/2005. FINDINGS: Lower chest: Lung bases are clear.  No pleural or pericardial fluid. Hepatobiliary: The gallbladder is distended. There are some layering stones. Mild prominence of the common bile duct without visible hyperdense ductal stone. Mild fatty change of the liver with some relative sparing along the margins of the gallbladder fossa. Pancreas: Mild prominence of the pancreatic duct. No focal pancreatic lesion. Spleen: Normal Adrenals/Urinary Tract: Adrenal glands are normal. Kidneys are normal. No cyst, mass, stone or hydronephrosis. Stomach/Bowel: No visible bowel pathology. Vascular/Lymphatic: Aortic atherosclerosis.  No aneurysm. Reproductive: Retroverted uterus. Other: No free fluid or air. Musculoskeletal: Previous spinal fusion L2 through L5. Degenerative changes above and below that. IMPRESSION:  The gallbladder is distended. There are some dependent stones in the gallbladder. The common duct is slightly prominent. No hyperdense stone is identified within the common duct. Mild hyperemia or fatty sparing of the liver parenchyma adjacent to the gallbladder fossa. These findings are nonspecific, but do raise the possibility of gallbladder inflammation or a common duct stone. Consider ultrasound for comparison with the study of 10/20/2016. Electronically Signed   By: Nelson Chimes M.D.   On: 10/28/2016 09:32    EKG: Independently reviewed. NA  Assessment/Plan Active Problems:   Abdominal pain  This is a 81 year old female who presents to hospital chief complaint abdominal pain. She has been recently diagnosed with gallstones. Her pain got acutely worse last night.  On initial physical examination blood pressure 121/71, heart rate 70, respiratory rate 18, oxygen saturation 99%. Her abdomen is soft, tender to deep palpation in the right upper  quadrant, no rebound or peritoneal signs. Sodium 141, potassium 5.2, chloride 105, bicarbonate 26, glucose 108, but 18, creatinine 0.88, lipase 19, AST 43, , albumin is 1.1, lactic acid 1.2, white count 16.9, hemoglobin 11.9, hematocrit 36.6, platelets 256, UA negative for infection. Abdominal CT with the dilated gallbladder, positive gallstones, prominent common bile duct. Ultrasound with dilatation of the distal common bile duct. AP and lateral films personally review showing no infiltrates, effusions or signs of pneumothorax.  Working diagnosis: Abdominal pain probably related to biliary colic, possibly common bile duct stone.  1. Biliary colic. Patient will be admitted to the medical floor, she will be placed on supportive IV fluids, pain control with morphine, further workup with MRCP, surgery and GI have been notified about patient being admitted. Likely patient will need cholecystectomy on this admission. Will place patient on a clear liquid diet  2. Hypertension. Continue antihypertensive agents including clonidine and by systolic, will hold on hydrochlorothiazide and amlodipine to avoid hypotension. Patient will receive IV fluids.  3. COPD. Stable with no exacerbation.  4. GERD. Continue omeprazole.    DVT prophylaxis: enoxaparin Code Status: Full  Family Communication: Patient with family at the bedside  Disposition Plan: Home  Consults called: Ed called GI and surgery Admission status: Inpatient  Kyleeann Cremeans Gerome Apley MD [facility] Pager [phone]  If 7PM-7AM, please contact night-coverage [email or web address] Password [facility]  10/28/2016, 3:54 PM

## 2016-10-28 NOTE — ED Notes (Signed)
Pt requests update from provider. Tegeler made aware and verbalizes will be at bedside in a few minutes.

## 2016-10-28 NOTE — ED Triage Notes (Signed)
Patient BIB [facility] from home for RUQ pain due to gallstones. Pt seen last Friday and dx for gallstones, was referred to surgeon for consultation next Friday. Patients pain became unbearable. EMS reports tender upon palpation, nausea and vomiting. Given 4mg  Zofran and 100 mcg fentanyl in route.

## 2016-10-28 NOTE — ED Provider Notes (Signed)
[facility] DEPT Provider Note   CSN: [id number] Arrival date & time: 10/28/16  0541     History   Chief Complaint Chief Complaint  Patient presents with  . Cholelithiasis    HPI Melanie [name] is a 81 y.o. female with a past medical history significant for COPD, hypertension, hyperlipidemia, and recent diagnosis of gallstones who presents with severe abdominal pain, nausea, vomiting, and intolerance of oral intake. Patient says that several days ago, she was diagnosed with gallstones and has a referral to see surgery this week. She reports that over the last few days, the pain has worsened and is now a 10 out of 10 constant pain. She describes it in her epigastrium and right upper quadrant. She reports nausea with vomiting. She has not been able to eat or drink in several days. She cannot keep the pain medicine down. She denies constipation, diarrhea, or dysuria. She denies fevers or chills. She says the pain is similar to the gallstone pain. She denies any recent traumas. She says the pain does not radiate to other places and says it is painful when she takes a deep breath. She denies any congestion, cough, or hemoptysis.  HPI  Past Medical History:  Diagnosis Date  . COPD (chronic obstructive pulmonary disease) ([place])   . Hyperlipidemia   . Hypertension   . Uterine fibroid     There are no active problems to display for this patient.   Past Surgical History:  Procedure Laterality Date  . BACK SURGERY     X2  . JOINT REPLACEMENT     knee    OB History    No data available       Home Medications    Prior to Admission medications   Medication Sig Start Date End Date Taking? Authorizing Provider  amLODipine (NORVASC) 10 MG tablet Take 0.5 mg by mouth daily.   Yes Historical Provider, MD  amoxicillin (AMOXIL) 875 MG tablet Take 875 mg by mouth daily. 10/02/16  Yes Historical Provider, MD  aspirin EC 81 MG tablet Take 81 mg by mouth daily.   Yes Historical Provider, MD   cloNIDine (CATAPRES) 0.1 MG tablet Take 0.1 mg by mouth daily.    Yes Historical Provider, MD  hydrochlorothiazide (HYDRODIURIL) 25 MG tablet Take 25 mg by mouth daily.   Yes Historical Provider, MD  Multiple Vitamin (MULTIVITAMIN) tablet Take 1 tablet by mouth daily.   Yes Historical Provider, MD  nebivolol (BYSTOLIC) 5 MG tablet Take 5 mg by mouth daily.   Yes Historical Provider, MD  omeprazole (PRILOSEC) 40 MG capsule Take 40 mg by mouth daily.   Yes Historical Provider, MD  potassium chloride SA (K-DUR,KLOR-CON) 20 MEQ tablet Take 20 mEq by mouth 2 (two) times daily.   Yes Historical Provider, MD  FLUZONE HIGH-DOSE 0.5 ML SUSY TO BE ADMINISTERED BY PHARMACIST FOR IMMUNIZATION 07/25/16   Historical Provider, MD    Family History Family History  Problem Relation Age of Onset  . Heart disease Mother     before age 46    Social History Social History  Substance Use Topics  . Smoking status: Former Smoker    Quit date: 09/08/1998  . Smokeless tobacco: Not on file  . Alcohol use 0.0 oz/week     Comment: occasional     Allergies   Nsaids and Ace inhibitors   Review of Systems Review of Systems  Constitutional: Negative for activity change, chills, diaphoresis, fatigue and fever.  HENT: Negative for congestion  and rhinorrhea.   Eyes: Negative for visual disturbance.  Respiratory: Negative for cough, chest tightness, shortness of breath and stridor.   Cardiovascular: Negative for chest pain, palpitations and leg swelling.  Gastrointestinal: Positive for abdominal pain, nausea and vomiting. Negative for abdominal distention, constipation and diarrhea.  Genitourinary: Negative for difficulty urinating, dysuria, flank pain, frequency, hematuria, menstrual problem, pelvic pain, vaginal bleeding and vaginal discharge.  Musculoskeletal: Negative for back pain and neck pain.  Skin: Negative for rash and wound.  Neurological: Negative for dizziness, weakness, light-headedness, numbness  and headaches.  Psychiatric/Behavioral: Negative for agitation and confusion.  All other systems reviewed and are negative.    Physical Exam Updated Vital Signs BP (!) 165/50 (BP Location: Left Arm)   Pulse 69   Temp 98.5 F (36.9 C) (Oral)   Resp 20   Ht 5\' 3"  (1.6 m)   Wt 155 lb (70.3 kg)   SpO2 93%   BMI 27.46 kg/m   Physical Exam  Constitutional: She appears well-developed and well-nourished. No distress.  HENT:  Head: Normocephalic and atraumatic.  Eyes: Conjunctivae and EOM are normal. Pupils are equal, round, and reactive to light.  Neck: Neck supple.  Cardiovascular: Normal rate and regular rhythm.   No murmur heard. Pulmonary/Chest: Effort normal and breath sounds normal. No stridor. No respiratory distress. She has no wheezes.  Abdominal: Soft. Normal appearance. There is tenderness in the right upper quadrant and epigastric area. There is no rigidity, no guarding and no CVA tenderness.    Musculoskeletal: She exhibits no edema.  Neurological: She is alert. No sensory deficit. She exhibits normal muscle tone.  Skin: Skin is warm and dry. No rash noted. No erythema.  Psychiatric: She has a normal mood and affect.  Nursing note and vitals reviewed.    ED Treatments / Results  Labs (all labs ordered are listed, but only abnormal results are displayed) Labs Reviewed  CBC WITH DIFFERENTIAL/PLATELET - Abnormal; Notable for the following:       Result Value   WBC 16.9 (*)    Hemoglobin 11.9 (*)    Neutro Abs 14.1 (*)    Monocytes Absolute 1.2 (*)    All other components within normal limits  COMPREHENSIVE METABOLIC PANEL - Abnormal; Notable for the following:    Potassium 5.2 (*)    Glucose, Bld 108 (*)    AST 43 (*)    ALT 8 (*)    All other components within normal limits  URINALYSIS, ROUTINE W REFLEX MICROSCOPIC - Abnormal; Notable for the following:    Ketones, ur 20 (*)    All other components within normal limits  LIPASE, BLOOD  COMPREHENSIVE  METABOLIC PANEL  CBC  I-STAT CG4 LACTIC ACID, ED    EKG  EKG Interpretation None       Radiology Dg Chest 2 View  Result Date: 10/28/2016 CLINICAL DATA:  Right upper quadrant pain.  Known gallstones. EXAM: CHEST  2 VIEW COMPARISON:  08/20/2006 FINDINGS: Normal heart size. Lungs hyperaerated and clear. No pneumothorax. No pleural effusion. Lumbar fusion hardware is in place. IMPRESSION: No active cardiopulmonary disease. Electronically Signed   By: Marybelle Killings M.D.   On: 10/28/2016 08:14   US Abdomen Complete  Result Date: 10/28/2016 CLINICAL DATA:  Pain. EXAM: ABDOMEN ULTRASOUND COMPLETE COMPARISON:  CT scan from earlier today and right upper quadrant ultrasound October 20, 2016 FINDINGS: Gallbladder: Sludge and layering stones seen in the gallbladder. No Murphy's sign is reported. No wall thickening or pericholecystic fluid. The  gallbladder remains distended. Common bile duct: Diameter: The central common bile duct measures 7.1 mm which is stable. At the level of the pancreatic head, the common bile duct measures 11.3 mm which is distended. There may be some sludge in the gallbladder but no discrete stones are identified. Liver: No focal lesion identified. Within normal limits in parenchymal echogenicity. IVC: No abnormality visualized. Pancreas: Visualized portion unremarkable. Spleen: Size and appearance within normal limits. Right Kidney: Length: 9.6 cm. Echogenicity within normal limits. No mass or hydronephrosis visualized. Left Kidney: Length: 8.1 cm. Echogenicity within normal limits. No mass or hydronephrosis visualized. Abdominal aorta: No aneurysm visualized. Other findings: None. IMPRESSION: 1. Stones and sludge are seen in the gallbladder without wall thickening, pericholecystic fluid, or Murphy's sign. 2. The central common bile duct measures 7 mm which is stable in the interval. The distal common bile duct measures 11 mm which was not imaged previously. However, the 11 mm diameter  in the distal duct is abnormal. No discrete stones are seen in the common bile duct but there appears to be sludge. An MRCP could better evaluate the common bile duct for underlying stone or obstruction. 3. No other acute abnormalities. Electronically Signed   By: Dorise Bullion III M.D   On: 10/28/2016 12:17   Ct Abdomen Pelvis W Contrast  Result Date: 10/28/2016 CLINICAL DATA:  Worsened right upper quadrant pain with nausea and vomiting. History of gallstones. EXAM: CT ABDOMEN AND PELVIS WITH CONTRAST TECHNIQUE: Multidetector CT imaging of the abdomen and pelvis was performed using the standard protocol following bolus administration of intravenous contrast. CONTRAST:  152mL ISOVUE-300 IOPAMIDOL (ISOVUE-300) INJECTION 61% COMPARISON:  Ultrasound 10/20/2016.  CT 06/24/2005. FINDINGS: Lower chest: Lung bases are clear.  No pleural or pericardial fluid. Hepatobiliary: The gallbladder is distended. There are some layering stones. Mild prominence of the common bile duct without visible hyperdense ductal stone. Mild fatty change of the liver with some relative sparing along the margins of the gallbladder fossa. Pancreas: Mild prominence of the pancreatic duct. No focal pancreatic lesion. Spleen: Normal Adrenals/Urinary Tract: Adrenal glands are normal. Kidneys are normal. No cyst, mass, stone or hydronephrosis. Stomach/Bowel: No visible bowel pathology. Vascular/Lymphatic: Aortic atherosclerosis.  No aneurysm. Reproductive: Retroverted uterus. Other: No free fluid or air. Musculoskeletal: Previous spinal fusion L2 through L5. Degenerative changes above and below that. IMPRESSION: The gallbladder is distended. There are some dependent stones in the gallbladder. The common duct is slightly prominent. No hyperdense stone is identified within the common duct. Mild hyperemia or fatty sparing of the liver parenchyma adjacent to the gallbladder fossa. These findings are nonspecific, but do raise the possibility of  gallbladder inflammation or a common duct stone. Consider ultrasound for comparison with the study of 10/20/2016. Electronically Signed   By: Nelson Chimes M.D.   On: 10/28/2016 09:32    Procedures Procedures (including critical care time)  Medications Ordered in ED Medications  iopamidol (ISOVUE-300) 61 % injection (not administered)  aspirin EC tablet 81 mg (81 mg Oral Given 10/28/16 1725)  pantoprazole (PROTONIX) EC tablet 40 mg (40 mg Oral Given 10/28/16 1725)  cloNIDine (CATAPRES) tablet 0.1 mg (0.1 mg Oral Given 10/28/16 1725)  nebivolol (BYSTOLIC) tablet 5 mg (5 mg Oral Given 10/28/16 1725)  enoxaparin (LOVENOX) injection 40 mg (not administered)  dextrose 5 %-0.9 % sodium chloride infusion ( Intravenous New Bag/Given 10/28/16 1630)  acetaminophen (TYLENOL) tablet 650 mg (not administered)    Or  acetaminophen (TYLENOL) suppository 650 mg (not administered)  ondansetron (  ZOFRAN) tablet 4 mg (not administered)    Or  ondansetron (ZOFRAN) injection 4 mg (not administered)  morphine 2 MG/ML injection 1 mg (not administered)  multivitamin with minerals tablet 1 tablet (1 tablet Oral Given 10/28/16 1725)  sodium chloride 0.9 % bolus 1,000 mL (0 mLs Intravenous Stopped 10/28/16 0856)  fentaNYL (SUBLIMAZE) injection 50 mcg (50 mcg Intravenous Given 10/28/16 0747)  ondansetron (ZOFRAN) injection 4 mg (4 mg Intravenous Given 10/28/16 0813)  iopamidol (ISOVUE-300) 61 % injection 100 mL (100 mLs Intravenous Contrast Given 10/28/16 0914)  sodium chloride 0.9 % bolus 1,000 mL (0 mLs Intravenous Stopped 10/28/16 1451)     Initial Impression / Assessment and Plan / ED Course  I have reviewed the triage vital signs and the nursing notes.  Pertinent labs & imaging results that were available during my care of the patient were reviewed by me and considered in my medical decision making (see chart for details).     ZURIANA MCGURL is a 81 y.o. female with a past medical history significant for COPD,  hypertension, hyperlipidemia, and recent diagnosis of gallstones who presents with severe abdominal pain, nausea, vomiting, and intolerance of oral intake.  History and exam are seen above.  On exam, patient has severe tenderness across her abdomen in the right upper quadrant and epigastric areas. She has no CVA tenderness. Lungs are clear. Exam otherwise unremarkable.  Patient will be given pain medicine, nausea medicine, and fluids. Laboratory testing and CT scan ordered to look for evidence of perforation, abscess, or acute cholecystitis. Anticipate following up on workup results.  CT scan showed distended gallbladder and distended bile duct. Ultrasound was recommended for comparison.  Ultrasound obtained showing dilated distal bile duct. MRCP recommended for further evaluation. No evidence of acute cholecystitis on imaging. Patient has leukocytosis.  Gastroenterology and surgery called and G.I. Recommends admission to hospitalist service and MRCP. They recommended Symptomatic management and Continued workup.  Patient and family agreed with plan of admission. Patient admitted in stable condition for further management.     Final Clinical Impressions(s) / ED Diagnoses   Final diagnoses:  RUQ pain  Gallstones  Nausea    New Prescriptions Current Discharge Medication List      Clinical Impression: 1. RUQ pain   2. Gallstones   3. Nausea   4. Pain     Disposition: Admit to Hospitalist service    Courtney Paris, MD 10/28/16 1924

## 2016-10-28 NOTE — Consult Note (Signed)
Reason for Consult:gallstones Referring Physician: [place]  Melanie [name] is an 81 y.o. female.  HPI:  Pt is a 81 yo F who presents with 2 days of severe abdominal pain, nausea, and vomiting.  She has had difficulty getting anything down.  The patient was diagnosed with gallstones earlier this week and was scheduled to see surgeon.  However, pain has worsened to now 10/10.  Pain medication not working.  She does have worse pain with deep inspiration.  The morphine is not working for her.    Past Medical History:  Diagnosis Date  . COPD (chronic obstructive pulmonary disease) ([place])   . Hyperlipidemia   . Hypertension   . Uterine fibroid     Past Surgical History:  Procedure Laterality Date  . BACK SURGERY     X2  . JOINT REPLACEMENT     knee    Family History  Problem Relation Age of Onset  . Heart disease Mother     before age 108    Social History:  reports that she quit smoking about 18 years ago. She has never used smokeless tobacco. She reports that she drinks alcohol. She reports that she does not use drugs.  Allergies:  Allergies  Allergen Reactions  . Nsaids     hypertension  . Ace Inhibitors Other (See Comments)    cough    Medications:  Prior to Admission:  Prescriptions Prior to Admission  Medication Sig Dispense Refill Last Dose  . amLODipine (NORVASC) 10 MG tablet Take 0.5 mg by mouth daily.   10/27/2016 at Unknown time  . amoxicillin (AMOXIL) 875 MG tablet Take 875 mg by mouth daily.  3 10/27/2016 at Unknown time  . aspirin EC 81 MG tablet Take 81 mg by mouth daily.   10/27/2016 at Unknown time  . cloNIDine (CATAPRES) 0.1 MG tablet Take 0.1 mg by mouth daily.    10/27/2016 at Unknown time  . hydrochlorothiazide (HYDRODIURIL) 25 MG tablet Take 25 mg by mouth daily.   10/27/2016 at Unknown time  . Multiple Vitamin (MULTIVITAMIN) tablet Take 1 tablet by mouth daily.   10/27/2016 at Unknown time  . nebivolol (BYSTOLIC) 5 MG tablet Take 5 mg by mouth daily.    10/27/2016 at Unknown time  . omeprazole (PRILOSEC) 40 MG capsule Take 40 mg by mouth daily.   10/27/2016 at Unknown time  . potassium chloride SA (K-DUR,KLOR-CON) 20 MEQ tablet Take 20 mEq by mouth 2 (two) times daily.   10/27/2016 at Unknown time  . FLUZONE HIGH-DOSE 0.5 ML SUSY TO BE ADMINISTERED BY PHARMACIST FOR IMMUNIZATION  0 october    Results for orders placed or performed during the hospital encounter of 10/28/16 (from the past 48 hour(s))  CBC with Differential     Status: Abnormal   Collection Time: 10/28/16  7:45 AM  Result Value Ref Range   WBC 16.9 (H) 4.0 - 10.5 K/uL   RBC 4.40 3.87 - 5.11 MIL/uL   Hemoglobin 11.9 (L) 12.0 - 15.0 g/dL   HCT 36.6 36.0 - 46.0 %   MCV 83.2 78.0 - 100.0 fL   MCH 27.0 26.0 - 34.0 pg   MCHC 32.5 30.0 - 36.0 g/dL   RDW 14.9 11.5 - 15.5 %   Platelets 256 150 - 400 K/uL   Neutrophils Relative % 83 %   Neutro Abs 14.1 (H) 1.7 - 7.7 K/uL   Lymphocytes Relative 9 %   Lymphs Abs 1.4 0.7 - 4.0 K/uL   Monocytes Relative  7 %   Monocytes Absolute 1.2 (H) 0.1 - 1.0 K/uL   Eosinophils Relative 1 %   Eosinophils Absolute 0.1 0.0 - 0.7 K/uL   Basophils Relative 0 %   Basophils Absolute 0.0 0.0 - 0.1 K/uL  Comprehensive metabolic panel     Status: Abnormal   Collection Time: 10/28/16  7:45 AM  Result Value Ref Range   Sodium 141 135 - 145 mmol/L   Potassium 5.2 (H) 3.5 - 5.1 mmol/L   Chloride 105 101 - 111 mmol/L   CO2 26 22 - 32 mmol/L   Glucose, Bld 108 (H) 65 - 99 mg/dL   BUN 18 6 - 20 mg/dL   Creatinine, Ser 0.88 0.44 - 1.00 mg/dL   Calcium 9.3 8.9 - 10.3 mg/dL   Total Protein 7.6 6.5 - 8.1 g/dL   Albumin 3.5 3.5 - 5.0 g/dL   AST 43 (H) 15 - 41 U/L   ALT 8 (L) 14 - 54 U/L   Alkaline Phosphatase 61 38 - 126 U/L   Total Bilirubin 1.1 0.3 - 1.2 mg/dL   GFR calc non Af Amer >60 >60 mL/min   GFR calc Af Amer >60 >60 mL/min    Comment: (NOTE) The eGFR has been calculated using the CKD EPI equation. This calculation has not been validated in all  clinical situations. eGFR's persistently <60 mL/min signify possible Chronic Kidney Disease.    Anion gap 10 5 - 15  Lipase, blood     Status: None   Collection Time: 10/28/16  7:45 AM  Result Value Ref Range   Lipase 19 11 - 51 U/L  I-Stat CG4 Lactic Acid, ED     Status: None   Collection Time: 10/28/16  7:54 AM  Result Value Ref Range   Lactic Acid, Venous 1.28 0.5 - 1.9 mmol/L  Urinalysis, Routine w reflex microscopic     Status: Abnormal   Collection Time: 10/28/16  9:40 AM  Result Value Ref Range   Color, Urine YELLOW YELLOW   APPearance CLEAR CLEAR   Specific Gravity, Urine 1.025 1.005 - 1.030   pH 7.0 5.0 - 8.0   Glucose, UA NEGATIVE NEGATIVE mg/dL   Hgb urine dipstick NEGATIVE NEGATIVE   Bilirubin Urine NEGATIVE NEGATIVE   Ketones, ur 20 (A) NEGATIVE mg/dL   Protein, ur NEGATIVE NEGATIVE mg/dL   Nitrite NEGATIVE NEGATIVE   Leukocytes, UA NEGATIVE NEGATIVE    Dg Chest 2 View  Result Date: 10/28/2016 CLINICAL DATA:  Right upper quadrant pain.  Known gallstones. EXAM: CHEST  2 VIEW COMPARISON:  08/20/2006 FINDINGS: Normal heart size. Lungs hyperaerated and clear. No pneumothorax. No pleural effusion. Lumbar fusion hardware is in place. IMPRESSION: No active cardiopulmonary disease. Electronically Signed   By: Marybelle Killings M.D.   On: 10/28/2016 08:14   US Abdomen Complete  Result Date: 10/28/2016 CLINICAL DATA:  Pain. EXAM: ABDOMEN ULTRASOUND COMPLETE COMPARISON:  CT scan from earlier today and right upper quadrant ultrasound October 20, 2016 FINDINGS: Gallbladder: Sludge and layering stones seen in the gallbladder. No Murphy's sign is reported. No wall thickening or pericholecystic fluid. The gallbladder remains distended. Common bile duct: Diameter: The central common bile duct measures 7.1 mm which is stable. At the level of the pancreatic head, the common bile duct measures 11.3 mm which is distended. There may be some sludge in the gallbladder but no discrete stones are  identified. Liver: No focal lesion identified. Within normal limits in parenchymal echogenicity. IVC: No abnormality visualized. Pancreas: Visualized  portion unremarkable. Spleen: Size and appearance within normal limits. Right Kidney: Length: 9.6 cm. Echogenicity within normal limits. No mass or hydronephrosis visualized. Left Kidney: Length: 8.1 cm. Echogenicity within normal limits. No mass or hydronephrosis visualized. Abdominal aorta: No aneurysm visualized. Other findings: None. IMPRESSION: 1. Stones and sludge are seen in the gallbladder without wall thickening, pericholecystic fluid, or Murphy's sign. 2. The central common bile duct measures 7 mm which is stable in the interval. The distal common bile duct measures 11 mm which was not imaged previously. However, the 11 mm diameter in the distal duct is abnormal. No discrete stones are seen in the common bile duct but there appears to be sludge. An MRCP could better evaluate the common bile duct for underlying stone or obstruction. 3. No other acute abnormalities. Electronically Signed   By: Dorise Bullion III M.D   On: 10/28/2016 12:17   Ct Abdomen Pelvis W Contrast  Result Date: 10/28/2016 CLINICAL DATA:  Worsened right upper quadrant pain with nausea and vomiting. History of gallstones. EXAM: CT ABDOMEN AND PELVIS WITH CONTRAST TECHNIQUE: Multidetector CT imaging of the abdomen and pelvis was performed using the standard protocol following bolus administration of intravenous contrast. CONTRAST:  175m ISOVUE-300 IOPAMIDOL (ISOVUE-300) INJECTION 61% COMPARISON:  Ultrasound 10/20/2016.  CT 06/24/2005. FINDINGS: Lower chest: Lung bases are clear.  No pleural or pericardial fluid. Hepatobiliary: The gallbladder is distended. There are some layering stones. Mild prominence of the common bile duct without visible hyperdense ductal stone. Mild fatty change of the liver with some relative sparing along the margins of the gallbladder fossa. Pancreas: Mild  prominence of the pancreatic duct. No focal pancreatic lesion. Spleen: Normal Adrenals/Urinary Tract: Adrenal glands are normal. Kidneys are normal. No cyst, mass, stone or hydronephrosis. Stomach/Bowel: No visible bowel pathology. Vascular/Lymphatic: Aortic atherosclerosis.  No aneurysm. Reproductive: Retroverted uterus. Other: No free fluid or air. Musculoskeletal: Previous spinal fusion L2 through L5. Degenerative changes above and below that. IMPRESSION: The gallbladder is distended. There are some dependent stones in the gallbladder. The common duct is slightly prominent. No hyperdense stone is identified within the common duct. Mild hyperemia or fatty sparing of the liver parenchyma adjacent to the gallbladder fossa. These findings are nonspecific, but do raise the possibility of gallbladder inflammation or a common duct stone. Consider ultrasound for comparison with the study of 10/20/2016. Electronically Signed   By: MNelson ChimesM.D.   On: 10/28/2016 09:32    Review of Systems  All other systems reviewed and are negative.    Blood pressure (!) 174/74, pulse 66, temperature 98.4 F (36.9 C), temperature source Oral, resp. rate 18, height 5' 3"  (1.6 m), weight 70.3 kg (155 lb), SpO2 98 %.   Physical Exam  Constitutional: She is oriented to person, place, and time. She appears well-developed and well-nourished. She appears distressed.  HENT:  Head: Normocephalic and atraumatic.  Right Ear: External ear normal.  Left Ear: External ear normal.  Eyes: Conjunctivae are normal. Pupils are equal, round, and reactive to light. No scleral icterus.  Neck: Normal range of motion. Neck supple.  Cardiovascular: Normal rate.   Respiratory: Effort normal. No respiratory distress.  GI: Soft. She exhibits no distension. There is tenderness (mild RUQ tenderness). There is no rebound and no guarding.  Musculoskeletal: She exhibits no edema or tenderness.  Neurological: She is alert and oriented to person,  place, and time. Coordination normal.  Skin: Skin is warm and dry. No rash noted. She is not diaphoretic. No erythema.  No pallor.  Psychiatric: She has a normal mood and affect. Her behavior is normal. Judgment and thought content normal.     Assessment/Plan: Gallstones.   MRCP ordered. NPO after MN. COPD HTN - on 4 meds.  ? Does she need any medical risk stratification/echo prior to surgery. Tentative plan for lap chole this admit if medically stable.   Taline Nass 10/28/2016, 9:48 PM

## 2016-10-28 NOTE — ED Notes (Signed)
Bed: [id number] Expected date:  Expected time:  Means of arrival:  Comments: EMS 81 yo female with gallstones, surgical consult this Friday-N/V-IV Zofran

## 2016-10-28 NOTE — Progress Notes (Signed)
Patient was crying due to severe gallstones pain.  Notified Dr Barry Dienes and amazingly she had been to see her and wrote for dilaudid to relieve pain.

## 2016-10-29 DIAGNOSIS — E876 Hypokalemia: Secondary | ICD-10-CM

## 2016-10-29 DIAGNOSIS — K8041 Calculus of bile duct with cholecystitis, unspecified, with obstruction: Secondary | ICD-10-CM

## 2016-10-29 LAB — CBC
HCT: 35.1 % — ABNORMAL LOW (ref 36.0–46.0)
Hemoglobin: 11.2 g/dL — ABNORMAL LOW (ref 12.0–15.0)
MCH: 26.4 pg (ref 26.0–34.0)
MCHC: 31.9 g/dL (ref 30.0–36.0)
MCV: 82.8 fL (ref 78.0–100.0)
PLATELETS: 240 10*3/uL (ref 150–400)
RBC: 4.24 MIL/uL (ref 3.87–5.11)
RDW: 14.7 % (ref 11.5–15.5)
WBC: 23.2 10*3/uL — AB (ref 4.0–10.5)

## 2016-10-29 LAB — COMPREHENSIVE METABOLIC PANEL
ALK PHOS: 65 U/L (ref 38–126)
ALT: 12 U/L — AB (ref 14–54)
AST: 16 U/L (ref 15–41)
Albumin: 3.2 g/dL — ABNORMAL LOW (ref 3.5–5.0)
Anion gap: 9 (ref 5–15)
BILIRUBIN TOTAL: 1.1 mg/dL (ref 0.3–1.2)
BUN: 15 mg/dL (ref 6–20)
CALCIUM: 8.7 mg/dL — AB (ref 8.9–10.3)
CO2: 27 mmol/L (ref 22–32)
CREATININE: 0.9 mg/dL (ref 0.44–1.00)
Chloride: 105 mmol/L (ref 101–111)
GFR, EST NON AFRICAN AMERICAN: 60 mL/min — AB (ref >60)
Glucose, Bld: 185 mg/dL — ABNORMAL HIGH (ref 65–99)
Potassium: 3.2 mmol/L — ABNORMAL LOW (ref 3.5–5.1)
Sodium: 141 mmol/L (ref 135–145)
Total Protein: 6.7 g/dL (ref 6.5–8.1)

## 2016-10-29 MED ORDER — PIPERACILLIN-TAZOBACTAM 3.375 G IVPB
3.3750 g | Freq: Three times a day (TID) | INTRAVENOUS | Status: DC
  Administered 2016-10-29 – 2016-10-30 (×3): 3.375 g via INTRAVENOUS
  Filled 2016-10-29 (×5): qty 50

## 2016-10-29 MED ORDER — KCL IN DEXTROSE-NACL 20-5-0.9 MEQ/L-%-% IV SOLN
INTRAVENOUS | Status: DC
  Administered 2016-10-29 – 2016-10-30 (×2): via INTRAVENOUS
  Filled 2016-10-29 (×3): qty 1000

## 2016-10-29 NOTE — Progress Notes (Signed)
PROGRESS NOTE   Melanie [name]  [id number]  DOB: Mar 18, 1936  DOA: 10/28/2016 PCP: Osborne Casco, MD  Hospital course:  Melanie [name] is a 81 y.o. female with medical history significant of gallstones presents to the hospital with the chief complaint of abdominal pain. She has been experiencing abdominal pain for the last 4 weeks, intermittent, localized in the right upper quadrant, she has been diagnosed with gallstones and she was scheduled to see surgery next week. Last night she had an acute exacerbation of her abdominal pain, localized in the right upper quadrant, epigastric region, colicky nature, improved with pain medication but persistent, associated with nausea but no vomiting, no diarrhea, no fevers or chills. Her pain seems to be worse with meals.   ED Course: Patient was found to be in pain, supportive supportive therapy was started, further workup showed dilatation of the distal common bile duct. Was referred for admission and further evaluation.  Assessment & Plan:   1. Biliary colic. Patient will be admitted to the medical floor, she will be placed on supportive IV fluids, pain control with morphine, further workup with MRCP, surgery and GI have been notified about patient being admitted. Likely patient will need cholecystectomy on this admission. Will place patient on a clear liquid diet  2. Hypertension. Continue antihypertensive agents including clonidine and by systolic, will hold on hydrochlorothiazide and amlodipine to avoid hypotension. Patient will receive IV fluids.  3. COPD. Stable with no exacerbation.  4. GERD. Continue omeprazole.  5.  Hypokalemia - replacing IV.  Recheck in AM   DVT prophylaxis: enoxaparin Code Status: Full  Family Communication: Patient with family at the bedside  Disposition Plan: Home  Consults called: Ed called GI and surgery Admission status: Inpatient  Subjective: Pt reports that pain is better controlled at this  time.   Objective: Vitals:   10/28/16 1632 10/28/16 2120 10/29/16 0541 10/29/16 1035  BP: (!) 174/74 (!) 123/59 (!) 163/56 (!) 141/60  Pulse: 66 70 66   Resp:  18 18   Temp:  100 F (37.8 C) 98.9 F (37.2 C)   TempSrc:  Oral Oral   SpO2:  100% 94%   Weight:      Height:        Intake/Output Summary (Last 24 hours) at 10/29/16 1238 Last data filed at 10/29/16 1000  Gross per 24 hour  Intake           2342.5 ml  Output                1 ml  Net           2341.5 ml   Filed Weights   10/28/16 0551  Weight: 70.3 kg (155 lb)   Exam:  General appearance: alert, cooperative and mild distress Resp: breathing comfortably GI: soft, non distended, tender upper abdomen Central nervous system: Alert and oriented. No focal neurological deficits. Extremities: no CCE.  Data Reviewed: Basic Metabolic Panel:  Recent Labs Lab 10/28/16 0745 10/29/16 0457  NA 141 141  K 5.2* 3.2*  CL 105 105  CO2 26 27  GLUCOSE 108* 185*  BUN 18 15  CREATININE 0.88 0.90  CALCIUM 9.3 8.7*   Liver Function Tests:  Recent Labs Lab 10/28/16 0745 10/29/16 0457  AST 43* 16  ALT 8* 12*  ALKPHOS 61 65  BILITOT 1.1 1.1  PROT 7.6 6.7  ALBUMIN 3.5 3.2*    Recent Labs Lab 10/28/16 0745  LIPASE 19   No results  for input(s): AMMONIA in the last 168 hours. CBC:  Recent Labs Lab 10/28/16 0745 10/29/16 0457  WBC 16.9* 23.2*  NEUTROABS 14.1*  --   HGB 11.9* 11.2*  HCT 36.6 35.1*  MCV 83.2 82.8  PLT 256 240   Cardiac Enzymes: No results for input(s): CKTOTAL, CKMB, CKMBINDEX, TROPONINI in the last 168 hours. CBG (last 3)  No results for input(s): GLUCAP in the last 72 hours. No results found for this or any previous visit (from the past 240 hour(s)).   Studies: Dg Chest 2 View  Result Date: 10/28/2016 CLINICAL DATA:  Right upper quadrant pain.  Known gallstones. EXAM: CHEST  2 VIEW COMPARISON:  08/20/2006 FINDINGS: Normal heart size. Lungs hyperaerated and clear. No pneumothorax.  No pleural effusion. Lumbar fusion hardware is in place. IMPRESSION: No active cardiopulmonary disease. Electronically Signed   By: Marybelle Killings M.D.   On: 10/28/2016 08:14   US Abdomen Complete  Result Date: 10/28/2016 CLINICAL DATA:  Pain. EXAM: ABDOMEN ULTRASOUND COMPLETE COMPARISON:  CT scan from earlier today and right upper quadrant ultrasound October 20, 2016 FINDINGS: Gallbladder: Sludge and layering stones seen in the gallbladder. No Murphy's sign is reported. No wall thickening or pericholecystic fluid. The gallbladder remains distended. Common bile duct: Diameter: The central common bile duct measures 7.1 mm which is stable. At the level of the pancreatic head, the common bile duct measures 11.3 mm which is distended. There may be some sludge in the gallbladder but no discrete stones are identified. Liver: No focal lesion identified. Within normal limits in parenchymal echogenicity. IVC: No abnormality visualized. Pancreas: Visualized portion unremarkable. Spleen: Size and appearance within normal limits. Right Kidney: Length: 9.6 cm. Echogenicity within normal limits. No mass or hydronephrosis visualized. Left Kidney: Length: 8.1 cm. Echogenicity within normal limits. No mass or hydronephrosis visualized. Abdominal aorta: No aneurysm visualized. Other findings: None. IMPRESSION: 1. Stones and sludge are seen in the gallbladder without wall thickening, pericholecystic fluid, or Murphy's sign. 2. The central common bile duct measures 7 mm which is stable in the interval. The distal common bile duct measures 11 mm which was not imaged previously. However, the 11 mm diameter in the distal duct is abnormal. No discrete stones are seen in the common bile duct but there appears to be sludge. An MRCP could better evaluate the common bile duct for underlying stone or obstruction. 3. No other acute abnormalities. Electronically Signed   By: Dorise Bullion III M.D   On: 10/28/2016 12:17   Ct Abdomen Pelvis W  Contrast  Result Date: 10/28/2016 CLINICAL DATA:  Worsened right upper quadrant pain with nausea and vomiting. History of gallstones. EXAM: CT ABDOMEN AND PELVIS WITH CONTRAST TECHNIQUE: Multidetector CT imaging of the abdomen and pelvis was performed using the standard protocol following bolus administration of intravenous contrast. CONTRAST:  158mL ISOVUE-300 IOPAMIDOL (ISOVUE-300) INJECTION 61% COMPARISON:  Ultrasound 10/20/2016.  CT 06/24/2005. FINDINGS: Lower chest: Lung bases are clear.  No pleural or pericardial fluid. Hepatobiliary: The gallbladder is distended. There are some layering stones. Mild prominence of the common bile duct without visible hyperdense ductal stone. Mild fatty change of the liver with some relative sparing along the margins of the gallbladder fossa. Pancreas: Mild prominence of the pancreatic duct. No focal pancreatic lesion. Spleen: Normal Adrenals/Urinary Tract: Adrenal glands are normal. Kidneys are normal. No cyst, mass, stone or hydronephrosis. Stomach/Bowel: No visible bowel pathology. Vascular/Lymphatic: Aortic atherosclerosis.  No aneurysm. Reproductive: Retroverted uterus. Other: No free fluid or air. Musculoskeletal: Previous  spinal fusion L2 through L5. Degenerative changes above and below that. IMPRESSION: The gallbladder is distended. There are some dependent stones in the gallbladder. The common duct is slightly prominent. No hyperdense stone is identified within the common duct. Mild hyperemia or fatty sparing of the liver parenchyma adjacent to the gallbladder fossa. These findings are nonspecific, but do raise the possibility of gallbladder inflammation or a common duct stone. Consider ultrasound for comparison with the study of 10/20/2016. Electronically Signed   By: Nelson Chimes M.D.   On: 10/28/2016 09:32   Scheduled Meds: . aspirin EC  81 mg Oral Daily  . cloNIDine  0.1 mg Oral Daily  . enoxaparin (LOVENOX) injection  40 mg Subcutaneous Q24H  .  multivitamin with minerals  1 tablet Oral Daily  . nebivolol  5 mg Oral Daily  . pantoprazole  40 mg Oral Daily  . piperacillin-tazobactam (ZOSYN)  IV  3.375 g Intravenous Q8H   Continuous Infusions: . dextrose 5 % and 0.9% NaCl 75 mL/hr (10/29/16 0558)    Active Problems:   Abdominal pain   Gallstone of bile duct with obstruction  Time spent:   Irwin Brakeman, MD, FAAFP [facility] Pager [phone]  If 7PM-7AM, please contact night-coverage [email or web address] Password [email or web address] 10/29/2016, 12:38 PM    LOS: 1 day

## 2016-10-29 NOTE — Progress Notes (Signed)
Pharmacy Antibiotic Note  Melanie [name] is a 81 y.o. female admitted on 10/28/2016 with intra-abdominal infection.  Pharmacy has been consulted for zosyn dosing. Patient with suspected choledocholithiasis, adding antibiotics for possible intra-abdominal infection.   Plan:  Zosyn 3.375gm IV q8h over 4h infusion   Height: 5\' 3"  (160 cm) Weight: 155 lb (70.3 kg) IBW/kg (Calculated) : 52.4  Temp (24hrs), Avg:98.7 F (37.1 C), Min:97.8 F (36.6 C), Max:100 F (37.8 C)   Recent Labs Lab 10/28/16 0745 10/28/16 0754 10/29/16 0457  WBC 16.9*  --  23.2*  CREATININE 0.88  --  0.90  LATICACIDVEN  --  1.28  --     Estimated Creatinine Clearance: 48.5 mL/min (by C-G formula based on SCr of 0.9 mg/dL).    Allergies  Allergen Reactions  . Nsaids     hypertension  . Ace Inhibitors Other (See Comments)    cough    Thank you for allowing pharmacy to be a part of this patient's care.  Doreene Eland, PharmD, BCPS.   Pager: [phone] 10/29/2016 9:41 AM

## 2016-10-29 NOTE — Progress Notes (Signed)
Subjective: Pain improved with dilaudid, but still in significant discomfort.    Objective: Vital signs in last 24 hours: Temp:  [97.8 F (36.6 C)-100 F (37.8 C)] 98.9 F (37.2 C) (01/21 0541) Pulse Rate:  [51-70] 66 (01/21 0541) Resp:  [16-18] 18 (01/21 0541) BP: (121-179)/(44-85) 163/56 (01/21 0541) SpO2:  [94 %-100 %] 94 % (01/21 0541) Last BM Date: 10/27/16  Intake/Output from previous day: 01/20 0701 - 01/21 0700 In: 3342.5 [P.O.:480; I.V.:862.5; IV Piggyback:2000] Out: 1 [Urine:1] Intake/Output this shift: No intake/output data recorded.  General appearance: alert, cooperative and mild distress Resp: breathing comfortably GI: soft, non distended, tender upper abdomen  Lab Results:   Recent Labs  10/28/16 0745 10/29/16 0457  WBC 16.9* 23.2*  HGB 11.9* 11.2*  HCT 36.6 35.1*  PLT 256 240   BMET  Recent Labs  10/28/16 0745 10/29/16 0457  NA 141 141  K 5.2* 3.2*  CL 105 105  CO2 26 27  GLUCOSE 108* 185*  BUN 18 15  CREATININE 0.88 0.90  CALCIUM 9.3 8.7*   PT/INR No results for input(s): LABPROT, INR in the last 72 hours. ABG No results for input(s): PHART, HCO3 in the last 72 hours.  Invalid input(s): PCO2, PO2  Studies/Results: Dg Chest 2 View  Result Date: 10/28/2016 CLINICAL DATA:  Right upper quadrant pain.  Known gallstones. EXAM: CHEST  2 VIEW COMPARISON:  08/20/2006 FINDINGS: Normal heart size. Lungs hyperaerated and clear. No pneumothorax. No pleural effusion. Lumbar fusion hardware is in place. IMPRESSION: No active cardiopulmonary disease. Electronically Signed   By: Marybelle Killings M.D.   On: 10/28/2016 08:14   US Abdomen Complete  Result Date: 10/28/2016 CLINICAL DATA:  Pain. EXAM: ABDOMEN ULTRASOUND COMPLETE COMPARISON:  CT scan from earlier today and right upper quadrant ultrasound October 20, 2016 FINDINGS: Gallbladder: Sludge and layering stones seen in the gallbladder. No Murphy's sign is reported. No wall thickening or  pericholecystic fluid. The gallbladder remains distended. Common bile duct: Diameter: The central common bile duct measures 7.1 mm which is stable. At the level of the pancreatic head, the common bile duct measures 11.3 mm which is distended. There may be some sludge in the gallbladder but no discrete stones are identified. Liver: No focal lesion identified. Within normal limits in parenchymal echogenicity. IVC: No abnormality visualized. Pancreas: Visualized portion unremarkable. Spleen: Size and appearance within normal limits. Right Kidney: Length: 9.6 cm. Echogenicity within normal limits. No mass or hydronephrosis visualized. Left Kidney: Length: 8.1 cm. Echogenicity within normal limits. No mass or hydronephrosis visualized. Abdominal aorta: No aneurysm visualized. Other findings: None. IMPRESSION: 1. Stones and sludge are seen in the gallbladder without wall thickening, pericholecystic fluid, or Murphy's sign. 2. The central common bile duct measures 7 mm which is stable in the interval. The distal common bile duct measures 11 mm which was not imaged previously. However, the 11 mm diameter in the distal duct is abnormal. No discrete stones are seen in the common bile duct but there appears to be sludge. An MRCP could better evaluate the common bile duct for underlying stone or obstruction. 3. No other acute abnormalities. Electronically Signed   By: Dorise Bullion III M.D   On: 10/28/2016 12:17   Ct Abdomen Pelvis W Contrast  Result Date: 10/28/2016 CLINICAL DATA:  Worsened right upper quadrant pain with nausea and vomiting. History of gallstones. EXAM: CT ABDOMEN AND PELVIS WITH CONTRAST TECHNIQUE: Multidetector CT imaging of the abdomen and pelvis was performed using the standard protocol following  bolus administration of intravenous contrast. CONTRAST:  135mL ISOVUE-300 IOPAMIDOL (ISOVUE-300) INJECTION 61% COMPARISON:  Ultrasound 10/20/2016.  CT 06/24/2005. FINDINGS: Lower chest: Lung bases are clear.   No pleural or pericardial fluid. Hepatobiliary: The gallbladder is distended. There are some layering stones. Mild prominence of the common bile duct without visible hyperdense ductal stone. Mild fatty change of the liver with some relative sparing along the margins of the gallbladder fossa. Pancreas: Mild prominence of the pancreatic duct. No focal pancreatic lesion. Spleen: Normal Adrenals/Urinary Tract: Adrenal glands are normal. Kidneys are normal. No cyst, mass, stone or hydronephrosis. Stomach/Bowel: No visible bowel pathology. Vascular/Lymphatic: Aortic atherosclerosis.  No aneurysm. Reproductive: Retroverted uterus. Other: No free fluid or air. Musculoskeletal: Previous spinal fusion L2 through L5. Degenerative changes above and below that. IMPRESSION: The gallbladder is distended. There are some dependent stones in the gallbladder. The common duct is slightly prominent. No hyperdense stone is identified within the common duct. Mild hyperemia or fatty sparing of the liver parenchyma adjacent to the gallbladder fossa. These findings are nonspecific, but do raise the possibility of gallbladder inflammation or a common duct stone. Consider ultrasound for comparison with the study of 10/20/2016. Electronically Signed   By: Nelson Chimes M.D.   On: 10/28/2016 09:32    Anti-infectives: Anti-infectives    None      Assessment/Plan: s/p * No surgery found * symptomatic cholelithiasis  Suspected choledocholithiasis.  MRCP pending for today. Lap chole once evaluated for CBD stone.   LOS: 1 day    [facility] 10/29/2016

## 2016-10-30 ENCOUNTER — Inpatient Hospital Stay (HOSPITAL_COMMUNITY): Payer: Medicare Other | Admitting: Anesthesiology

## 2016-10-30 ENCOUNTER — Encounter (HOSPITAL_COMMUNITY): Payer: Self-pay | Admitting: Radiology

## 2016-10-30 ENCOUNTER — Encounter (HOSPITAL_COMMUNITY): Admission: EM | Disposition: A | Discharge status: Skilled Nursing Facility | Payer: Self-pay | Source: Home / Self Care

## 2016-10-30 ENCOUNTER — Inpatient Hospital Stay (HOSPITAL_COMMUNITY): Payer: Medicare Other

## 2016-10-30 DIAGNOSIS — R1084 Generalized abdominal pain: Secondary | ICD-10-CM

## 2016-10-30 DIAGNOSIS — Z9049 Acquired absence of other specified parts of digestive tract: Secondary | ICD-10-CM

## 2016-10-30 HISTORY — PX: CHOLECYSTECTOMY: SHX55

## 2016-10-30 LAB — CBC
HEMATOCRIT: 38.1 % (ref 36.0–46.0)
HEMOGLOBIN: 12.2 g/dL (ref 12.0–15.0)
MCH: 26.5 pg (ref 26.0–34.0)
MCHC: 32 g/dL (ref 30.0–36.0)
MCV: 82.6 fL (ref 78.0–100.0)
Platelets: 258 10*3/uL (ref 150–400)
RBC: 4.61 MIL/uL (ref 3.87–5.11)
RDW: 14.8 % (ref 11.5–15.5)
WBC: 13.8 10*3/uL — AB (ref 4.0–10.5)

## 2016-10-30 LAB — CREATININE, SERUM
Creatinine, Ser: 1.41 mg/dL — ABNORMAL HIGH (ref 0.44–1.00)
GFR calc non Af Amer: 35 mL/min — ABNORMAL LOW (ref >60)
GFR, EST AFRICAN AMERICAN: 40 mL/min — AB (ref >60)

## 2016-10-30 LAB — SURGICAL PCR SCREEN
MRSA, PCR: POSITIVE — AB
Staphylococcus aureus: POSITIVE — AB

## 2016-10-30 SURGERY — LAPAROSCOPIC CHOLECYSTECTOMY WITH INTRAOPERATIVE CHOLANGIOGRAM
Anesthesia: General

## 2016-10-30 MED ORDER — LACTATED RINGERS IR SOLN
Status: DC | PRN
  Administered 2016-10-30: 1000 mL

## 2016-10-30 MED ORDER — MORPHINE SULFATE (PF) 2 MG/ML IV SOLN
1.0000 mg | INTRAVENOUS | Status: DC | PRN
  Administered 2016-10-30 – 2016-10-31 (×5): 1 mg via INTRAVENOUS
  Filled 2016-10-30 (×5): qty 1

## 2016-10-30 MED ORDER — PROPOFOL 10 MG/ML IV BOLUS
INTRAVENOUS | Status: DC | PRN
  Administered 2016-10-30: 120 mg via INTRAVENOUS
  Administered 2016-10-30: 30 mg via INTRAVENOUS

## 2016-10-30 MED ORDER — SUGAMMADEX SODIUM 200 MG/2ML IV SOLN
INTRAVENOUS | Status: AC
  Filled 2016-10-30: qty 2

## 2016-10-30 MED ORDER — PHENYLEPHRINE HCL 10 MG/ML IJ SOLN
INTRAMUSCULAR | Status: DC | PRN
  Administered 2016-10-30: 100 ug via INTRAVENOUS

## 2016-10-30 MED ORDER — HYDRALAZINE HCL 20 MG/ML IJ SOLN
10.0000 mg | INTRAMUSCULAR | Status: DC | PRN
  Administered 2016-11-02 – 2016-11-24 (×14): 10 mg via INTRAVENOUS
  Filled 2016-10-30 (×15): qty 1

## 2016-10-30 MED ORDER — BUPIVACAINE HCL (PF) 0.25 % IJ SOLN
INTRAMUSCULAR | Status: AC
  Filled 2016-10-30: qty 30

## 2016-10-30 MED ORDER — FENTANYL CITRATE (PF) 100 MCG/2ML IJ SOLN
INTRAMUSCULAR | Status: AC
  Filled 2016-10-30: qty 2

## 2016-10-30 MED ORDER — KCL IN DEXTROSE-NACL 20-5-0.45 MEQ/L-%-% IV SOLN
INTRAVENOUS | Status: AC
  Administered 2016-10-30 – 2016-11-08 (×15): via INTRAVENOUS
  Filled 2016-10-30 (×17): qty 1000

## 2016-10-30 MED ORDER — IOPAMIDOL (ISOVUE-300) INJECTION 61%
INTRAVENOUS | Status: DC | PRN
  Administered 2016-10-30: 5 mL

## 2016-10-30 MED ORDER — PANTOPRAZOLE SODIUM 40 MG IV SOLR
40.0000 mg | Freq: Every day | INTRAVENOUS | Status: DC
  Administered 2016-10-30 – 2016-10-31 (×2): 40 mg via INTRAVENOUS
  Filled 2016-10-30 (×2): qty 40

## 2016-10-30 MED ORDER — HEPARIN SODIUM (PORCINE) 5000 UNIT/ML IJ SOLN
5000.0000 [IU] | Freq: Three times a day (TID) | INTRAMUSCULAR | Status: DC
  Administered 2016-10-30 – 2016-11-16 (×53): 5000 [IU] via SUBCUTANEOUS
  Filled 2016-10-30 (×51): qty 1

## 2016-10-30 MED ORDER — FENTANYL CITRATE (PF) 100 MCG/2ML IJ SOLN
INTRAMUSCULAR | Status: DC | PRN
  Administered 2016-10-30: 50 ug via INTRAVENOUS

## 2016-10-30 MED ORDER — BUPIVACAINE LIPOSOME 1.3 % IJ SUSP
20.0000 mL | Freq: Once | INTRAMUSCULAR | Status: AC
  Administered 2016-10-30: 20 mL
  Filled 2016-10-30: qty 20

## 2016-10-30 MED ORDER — ROCURONIUM BROMIDE 100 MG/10ML IV SOLN
INTRAVENOUS | Status: DC | PRN
  Administered 2016-10-30: 40 mg via INTRAVENOUS
  Administered 2016-10-30: 10 mg via INTRAVENOUS

## 2016-10-30 MED ORDER — LIDOCAINE 2% (20 MG/ML) 5 ML SYRINGE
INTRAMUSCULAR | Status: AC
  Filled 2016-10-30: qty 5

## 2016-10-30 MED ORDER — HYDROMORPHONE HCL 1 MG/ML IJ SOLN
INTRAMUSCULAR | Status: AC
  Filled 2016-10-30: qty 1

## 2016-10-30 MED ORDER — SODIUM CHLORIDE 0.9 % IJ SOLN
INTRAMUSCULAR | Status: AC
  Filled 2016-10-30: qty 50

## 2016-10-30 MED ORDER — PROPOFOL 10 MG/ML IV BOLUS
INTRAVENOUS | Status: AC
  Filled 2016-10-30: qty 20

## 2016-10-30 MED ORDER — DEXAMETHASONE SODIUM PHOSPHATE 10 MG/ML IJ SOLN
INTRAMUSCULAR | Status: AC
  Filled 2016-10-30: qty 1

## 2016-10-30 MED ORDER — HYDROMORPHONE HCL 2 MG/ML IJ SOLN
1.0000 mg | Freq: Once | INTRAMUSCULAR | Status: AC
  Administered 2016-10-30: 1 mg via INTRAVENOUS
  Filled 2016-10-30: qty 1

## 2016-10-30 MED ORDER — PIPERACILLIN-TAZOBACTAM 3.375 G IVPB
3.3750 g | Freq: Three times a day (TID) | INTRAVENOUS | Status: DC
  Administered 2016-10-30 – 2016-11-01 (×6): 3.375 g via INTRAVENOUS
  Filled 2016-10-30 (×8): qty 50

## 2016-10-30 MED ORDER — ONDANSETRON HCL 4 MG/2ML IJ SOLN
INTRAMUSCULAR | Status: AC
Start: 2016-10-30 — End: 2016-10-30
  Filled 2016-10-30: qty 2

## 2016-10-30 MED ORDER — SUGAMMADEX SODIUM 200 MG/2ML IV SOLN
INTRAVENOUS | Status: DC | PRN
  Administered 2016-10-30: 150 mg via INTRAVENOUS

## 2016-10-30 MED ORDER — ROCURONIUM BROMIDE 50 MG/5ML IV SOSY
PREFILLED_SYRINGE | INTRAVENOUS | Status: AC
  Filled 2016-10-30: qty 5

## 2016-10-30 MED ORDER — GADOBENATE DIMEGLUMINE 529 MG/ML IV SOLN
15.0000 mL | Freq: Once | INTRAVENOUS | Status: AC | PRN
  Administered 2016-10-30: 14 mL via INTRAVENOUS

## 2016-10-30 MED ORDER — HYDROMORPHONE HCL 1 MG/ML IJ SOLN
0.2500 mg | INTRAMUSCULAR | Status: DC | PRN
  Administered 2016-10-30: 0.25 mg via INTRAVENOUS

## 2016-10-30 MED ORDER — DEXAMETHASONE SODIUM PHOSPHATE 10 MG/ML IJ SOLN
INTRAMUSCULAR | Status: DC | PRN
  Administered 2016-10-30: 10 mg via INTRAVENOUS

## 2016-10-30 MED ORDER — ENOXAPARIN SODIUM 40 MG/0.4ML ~~LOC~~ SOLN: 40.0000 mg | SUBCUTANEOUS | Status: DC

## 2016-10-30 MED ORDER — ONDANSETRON HCL 4 MG/2ML IJ SOLN
4.0000 mg | Freq: Four times a day (QID) | INTRAMUSCULAR | Status: DC | PRN
  Administered 2016-11-03 – 2016-11-22 (×11): 4 mg via INTRAVENOUS
  Filled 2016-10-30 (×11): qty 2

## 2016-10-30 MED ORDER — IOPAMIDOL (ISOVUE-300) INJECTION 61%
INTRAVENOUS | Status: AC
  Filled 2016-10-30: qty 50

## 2016-10-30 MED ORDER — ONDANSETRON 4 MG PO TBDP: 4.0000 mg | ORAL_TABLET | Freq: Four times a day (QID) | ORAL | Status: DC | PRN

## 2016-10-30 MED ORDER — LACTATED RINGERS IV SOLN
INTRAVENOUS | Status: DC | PRN
  Administered 2016-10-30 (×2): via INTRAVENOUS

## 2016-10-30 MED ORDER — LIDOCAINE HCL (CARDIAC) 20 MG/ML IV SOLN
INTRAVENOUS | Status: DC | PRN
  Administered 2016-10-30: 100 mg via INTRAVENOUS

## 2016-10-30 MED ORDER — ONDANSETRON HCL 4 MG/2ML IJ SOLN
INTRAMUSCULAR | Status: DC | PRN
  Administered 2016-10-30: 4 mg via INTRAVENOUS

## 2016-10-30 SURGICAL SUPPLY — 42 items
APPLICATOR COTTON TIP 6IN STRL (MISCELLANEOUS) ×3 IMPLANT
APPLIER CLIP 5 13 M/L LIGAMAX5 (MISCELLANEOUS) ×3
APPLIER CLIP ROT 10 11.4 M/L (STAPLE) ×3
BENZOIN TINCTURE PRP APPL 2/3 (GAUZE/BANDAGES/DRESSINGS) IMPLANT
CABLE HIGH FREQUENCY MONO STRZ (ELECTRODE) ×3 IMPLANT
CATH REDDICK CHOLANGI 4FR 50CM (CATHETERS) ×3 IMPLANT
CLIP APPLIE 5 13 M/L LIGAMAX5 (MISCELLANEOUS) ×1 IMPLANT
CLIP APPLIE ROT 10 11.4 M/L (STAPLE) ×1 IMPLANT
CLOSURE WOUND 1/2 X4 (GAUZE/BANDAGES/DRESSINGS)
COVER MAYO STAND STRL (DRAPES) ×3 IMPLANT
COVER SURGICAL LIGHT HANDLE (MISCELLANEOUS) IMPLANT
DECANTER SPIKE VIAL GLASS SM (MISCELLANEOUS) IMPLANT
DERMABOND ADVANCED (GAUZE/BANDAGES/DRESSINGS) ×2
DERMABOND ADVANCED .7 DNX12 (GAUZE/BANDAGES/DRESSINGS) ×1 IMPLANT
DRAPE C-ARM 42X120 X-RAY (DRAPES) ×3 IMPLANT
ELECT PENCIL ROCKER SW 15FT (MISCELLANEOUS) IMPLANT
ELECT REM PT RETURN 9FT ADLT (ELECTROSURGICAL) ×3
ELECTRODE REM PT RTRN 9FT ADLT (ELECTROSURGICAL) ×1 IMPLANT
GLOVE BIOGEL M 8.0 STRL (GLOVE) ×3 IMPLANT
GOWN STRL REUS W/TWL XL LVL3 (GOWN DISPOSABLE) ×6 IMPLANT
HEMOSTAT SURGICEL 4X8 (HEMOSTASIS) IMPLANT
IRRIG SUCT STRYKERFLOW 2 WTIP (MISCELLANEOUS) ×3
IRRIGATION SUCT STRKRFLW 2 WTP (MISCELLANEOUS) ×1 IMPLANT
IV CATH 14GX2 1/4 (CATHETERS) ×3 IMPLANT
KIT BASIN OR (CUSTOM PROCEDURE TRAY) ×3 IMPLANT
L-HOOK LAP DISP 36CM (ELECTROSURGICAL) ×3
LHOOK LAP DISP 36CM (ELECTROSURGICAL) ×1 IMPLANT
NS IRRIG 1000ML POUR BTL (IV SOLUTION) ×3 IMPLANT
POUCH RETRIEVAL ECOSAC 10 (ENDOMECHANICALS) ×1 IMPLANT
POUCH RETRIEVAL ECOSAC 10MM (ENDOMECHANICALS) ×2
SCISSORS LAP 5X45 EPIX DISP (ENDOMECHANICALS) ×3 IMPLANT
SLEEVE XCEL OPT CAN 5 100 (ENDOMECHANICALS) ×6 IMPLANT
STRIP CLOSURE SKIN 1/2X4 (GAUZE/BANDAGES/DRESSINGS) IMPLANT
SUT MNCRL AB 4-0 PS2 18 (SUTURE) ×3 IMPLANT
SUT VIC AB 4-0 SH 18 (SUTURE) ×3 IMPLANT
SYR 20CC LL (SYRINGE) ×3 IMPLANT
TOWEL OR 17X26 10 PK STRL BLUE (TOWEL DISPOSABLE) ×3 IMPLANT
TRAY LAPAROSCOPIC (CUSTOM PROCEDURE TRAY) ×3 IMPLANT
TROCAR BLADELESS OPT 5 100 (ENDOMECHANICALS) ×3 IMPLANT
TROCAR XCEL BLUNT TIP 100MML (ENDOMECHANICALS) IMPLANT
TROCAR XCEL NON-BLD 11X100MML (ENDOMECHANICALS) ×3 IMPLANT
TUBING INSUF HEATED (TUBING) ×3 IMPLANT

## 2016-10-30 NOTE — Progress Notes (Signed)
Patient ID: Melanie [name], female   DOB: 1936/10/08, 81 y.o.   MRN: [id number]   MRCP results noted.  Probably acute cholecystitis.  Will proceed with lap chole today.  Kaylyn Lim, MD, FACS

## 2016-10-30 NOTE — Transfer of Care (Signed)
Immediate Anesthesia Transfer of Care Note  Patient: Melanie [name]  Procedure(s) Performed: Procedure(s): LAPAROSCOPIC CHOLECYSTECTOMY WITH INTRAOPERATIVE CHOLANGIOGRAM (N/A)  Patient Location: PACU  Anesthesia Type:General  Level of Consciousness: awake, alert  and oriented  Airway & Oxygen Therapy: Patient Spontanous Breathing and Patient connected to face mask oxygen  Post-op Assessment: Report given to RN and Post -op Vital signs reviewed and stable  Post vital signs: Reviewed and stable  Last Vitals:  Vitals:   10/30/16 0614 10/30/16 1056  BP: (!) 131/49 131/69  Pulse: 74 77  Resp: 16 17  Temp: 37.4 C 36.7 C    Last Pain:  Vitals:   10/30/16 1056  TempSrc: Axillary  PainSc:       Patients Stated Pain Goal: 2 ([id number])  Complications: No apparent anesthesia complications

## 2016-10-30 NOTE — Anesthesia Procedure Notes (Signed)
Procedure Name: Intubation Date/Time: 10/30/2016 11:42 AM Performed by: Glory Buff Pre-anesthesia Checklist: Patient identified, Emergency Drugs available, Suction available and Patient being monitored Patient Re-evaluated:Patient Re-evaluated prior to inductionOxygen Delivery Method: Circle system utilized Preoxygenation: Pre-oxygenation with 100% oxygen Intubation Type: IV induction Ventilation: Mask ventilation without difficulty Laryngoscope Size: Miller and 3 Grade View: Grade I Tube type: Oral Tube size: 7.0 mm Number of attempts: 1 Airway Equipment and Method: Stylet and Oral airway Placement Confirmation: ETT inserted through vocal cords under direct vision,  positive ETCO2 and breath sounds checked- equal and bilateral Secured at: 20 cm Tube secured with: Tape Dental Injury: Teeth and Oropharynx as per pre-operative assessment

## 2016-10-30 NOTE — Anesthesia Preprocedure Evaluation (Addendum)
Anesthesia Evaluation  Patient identified by MRN, date of birth, ID band Patient awake    Reviewed: Allergy & Precautions, NPO status , Patient's Chart, lab work & pertinent test results  Airway Mallampati: II  TM Distance: >3 FB     Dental   Pulmonary COPD, former smoker,    breath sounds clear to auscultation       Cardiovascular hypertension,  Rhythm:Regular Rate:Normal     Neuro/Psych    GI/Hepatic negative GI ROS, Neg liver ROS,   Endo/Other  negative endocrine ROS  Renal/GU negative Renal ROS     Musculoskeletal   Abdominal   Peds  Hematology   Anesthesia Other Findings   Reproductive/Obstetrics                             Anesthesia Physical Anesthesia Plan  ASA: III  Anesthesia Plan: General   Post-op Pain Management:    Induction: Intravenous  Airway Management Planned: Oral ETT  Additional Equipment:   Intra-op Plan:   Post-operative Plan: Extubation in OR  Informed Consent: I have reviewed the patients History and Physical, chart, labs and discussed the procedure including the risks, benefits and alternatives for the proposed anesthesia with the patient or authorized representative who has indicated his/her understanding and acceptance.   Dental advisory given  Plan Discussed with: CRNA and Anesthesiologist  Anesthesia Plan Comments:         Anesthesia Quick Evaluation

## 2016-10-30 NOTE — Progress Notes (Signed)
PROGRESS NOTE   Melanie [name]  [id number]  DOB: 09-18-1936  DOA: 10/28/2016 PCP: Osborne Casco, MD  Hospital course:  Melanie [name] is a 81 y.o. female with medical history significant of gallstones presents to the hospital with the chief complaint of abdominal pain. She has been experiencing abdominal pain for the last 4 weeks, intermittent, localized in the right upper quadrant, she has been diagnosed with gallstones and she was scheduled to see surgery next week. Last night she had an acute exacerbation of her abdominal pain, localized in the right upper quadrant, epigastric region, colicky nature, improved with pain medication but persistent, associated with nausea but no vomiting, no diarrhea, no fevers or chills. Her pain seems to be worse with meals.   ED Course: Patient was found to be in pain, supportive supportive therapy was started, further workup showed dilatation of the distal common bile duct. Was referred for admission and further evaluation.  Assessment & Plan:   1. Acute cholecystitis and Biliary colic - POD#0 s/p lap chole.  Post op mgmt per surgery team.  Pt in a lot of pain right now.  Morphine 1 mg IV every hour ordered as needed.  Patient will be followed on the medical floor, zosyn and protonix IV ordered.   2. Hypertension. Hydralazine IV ordered as needed.  3. COPD. Stable with no exacerbation.  4. GERD. Continue omeprazole.  5.  Hypokalemia - replacing IV.  Recheck in AM   DVT prophylaxis: heparin Code Status: Full  Family Communication: Patient with family at the bedside  Disposition Plan: Home  Consults called: Ed called GI and surgery Admission status: Inpatient  Subjective: Pt is recently from surgery and in a lot of pain, morphine just given.   Objective: Vitals:   10/30/16 1430 10/30/16 1445 10/30/16 1500 10/30/16 1525  BP: (!) 145/86 (!) 155/78 (!) 159/81 (!) 165/84  Pulse: 82 84 83 80  Resp: 10 12 19 12   Temp:   98.2 F (36.8  C) 97.5 F (36.4 C)  TempSrc:      SpO2: 100% 100% 99% 100%  Weight:      Height:        Intake/Output Summary (Last 24 hours) at 10/30/16 1633 Last data filed at 10/30/16 1410  Gross per 24 hour  Intake             2480 ml  Output              420 ml  Net             2060 ml   Filed Weights   10/28/16 0551  Weight: 70.3 kg (155 lb)   Exam:  General appearance: alert, cooperative and appears uncomfortable. Resp: breathing comfortably GI: soft, non distended, wounds clean and dry. Central nervous system: Alert and oriented. No focal neurological deficits. Extremities: no CCE.  Data Reviewed: Basic Metabolic Panel:  Recent Labs Lab 10/28/16 0745 10/29/16 0457  NA 141 141  K 5.2* 3.2*  CL 105 105  CO2 26 27  GLUCOSE 108* 185*  BUN 18 15  CREATININE 0.88 0.90  CALCIUM 9.3 8.7*   Liver Function Tests:  Recent Labs Lab 10/28/16 0745 10/29/16 0457  AST 43* 16  ALT 8* 12*  ALKPHOS 61 65  BILITOT 1.1 1.1  PROT 7.6 6.7  ALBUMIN 3.5 3.2*    Recent Labs Lab 10/28/16 0745  LIPASE 19   No results for input(s): AMMONIA in the last 168 hours. CBC:  Recent  Labs Lab 10/28/16 0745 10/29/16 0457  WBC 16.9* 23.2*  NEUTROABS 14.1*  --   HGB 11.9* 11.2*  HCT 36.6 35.1*  MCV 83.2 82.8  PLT 256 240   Cardiac Enzymes: No results for input(s): CKTOTAL, CKMB, CKMBINDEX, TROPONINI in the last 168 hours. CBG (last 3)  No results for input(s): GLUCAP in the last 72 hours. Recent Results (from the past 240 hour(s))  Surgical PCR screen     Status: Abnormal   Collection Time: 10/30/16 10:59 AM  Result Value Ref Range Status   MRSA, PCR POSITIVE (A) NEGATIVE Final    Comment: RESULT CALLED TO, READ BACK BY AND VERIFIED WITH: K BRACEY AT 1626 ON 01.22.2018 BY NBROOKS    Staphylococcus aureus POSITIVE (A) NEGATIVE Final    Comment:        The Xpert SA Assay (FDA approved for NASAL specimens in patients over 23 years of age), is one component of a  comprehensive surveillance program.  Test performance has been validated by [facility] for patients greater than or equal to 29 year old. It is not intended to diagnose infection nor to guide or monitor treatment.      Studies: Dg Cholangiogram Operative  Result Date: 10/30/2016 CLINICAL DATA:  Right upper quadrant pain.  Cholelithiasis. EXAM: INTRAOPERATIVE CHOLANGIOGRAM TECHNIQUE: Cholangiographic images from the C-arm fluoroscopic device were submitted for interpretation post-operatively. Please see the procedural report for the amount of contrast and the fluoroscopy time utilized. COMPARISON:  MR 10/30/2016 FINDINGS: The extrahepatic biliary system is mildly dilated and there appears to be a transition point in the distal common bile duct. There is no evidence for an obstructing stone. Contrast drains into the duodenum. There is reflux into the intrahepatic biliary system. IMPRESSION: Patent biliary system. Mild dilatation of the extrahepatic biliary system and there may be a transition point in the distal common bile duct. No evidence for an obstructing stone. Findings could be related to a stricture but nonspecific. Electronically Signed   By: Markus Daft M.D.   On: 10/30/2016 13:25   Mr 3d Recon At Scanner  Result Date: 10/30/2016 CLINICAL DATA:  Acute right upper quadrant and epigastric abdominal pain with nausea. Cholelithiasis and biliary ductal dilatation seen on recent ultrasound and CT. EXAM: MRI ABDOMEN WITHOUT AND WITH CONTRAST (INCLUDING MRCP) TECHNIQUE: Multiplanar multisequence MR imaging of the abdomen was performed both before and after the administration of intravenous contrast. Heavily T2-weighted images of the biliary and pancreatic ducts were obtained, and three-dimensional MRCP images were rendered by post processing. CONTRAST:  51mL MULTIHANCE GADOBENATE DIMEGLUMINE 529 MG/ML IV SOLN COMPARISON:  CT and ultrasound on 10/28/2016 FINDINGS: Image degradation by motion artifact  noted. Lower chest: No acute findings. Hepatobiliary: The liver masses identified. Layering sludge tiny gallstones are seen in the dependent portion of the gallbladder. Gallbladder is distended and shows mild diffuse wall thickening, suspicious for acute cholecystitis. Mild biliary ductal dilatation seen with common bile duct measuring 11 mm, however no intraductal calculi are identified. Pancreas: No mass or inflammatory changes. No evidence of pancreatic ductal dilatation or pancreas divisum. Spleen:  Within normal limits in size and appearance. Adrenals/Urinary Tract: No masses identified. Tiny sub-cm left renal cyst. No evidence of hydronephrosis. Stomach/Bowel: Visualized portions within the abdomen are unremarkable. Vascular/Lymphatic: No pathologically enlarged lymph nodes identified. No abdominal aortic aneurysm. Other:  None. Musculoskeletal: No suspicious bone lesions identified. Lumbar spine fusion hardware noted. IMPRESSION: Image degradation by motion artifact noted. Distended gallbladder contains tiny gallstones and sludge in  shows mild diffuse wall thickening, suspicious for acute cholecystitis . Recommend clinical correlation, consider nuclear medicine hepatobiliary scan for further evaluation if clinically warranted. Mild biliary ductal dilatation. No radiographic evidence of choledocholithiasis or other obstructing etiology. Electronically Signed   By: Earle Gell M.D.   On: 10/30/2016 08:23   Mr Abdomen Mrcp Moise Boring Contast  Result Date: 10/30/2016 CLINICAL DATA:  Acute right upper quadrant and epigastric abdominal pain with nausea. Cholelithiasis and biliary ductal dilatation seen on recent ultrasound and CT. EXAM: MRI ABDOMEN WITHOUT AND WITH CONTRAST (INCLUDING MRCP) TECHNIQUE: Multiplanar multisequence MR imaging of the abdomen was performed both before and after the administration of intravenous contrast. Heavily T2-weighted images of the biliary and pancreatic ducts were obtained, and  three-dimensional MRCP images were rendered by post processing. CONTRAST:  36mL MULTIHANCE GADOBENATE DIMEGLUMINE 529 MG/ML IV SOLN COMPARISON:  CT and ultrasound on 10/28/2016 FINDINGS: Image degradation by motion artifact noted. Lower chest: No acute findings. Hepatobiliary: The liver masses identified. Layering sludge tiny gallstones are seen in the dependent portion of the gallbladder. Gallbladder is distended and shows mild diffuse wall thickening, suspicious for acute cholecystitis. Mild biliary ductal dilatation seen with common bile duct measuring 11 mm, however no intraductal calculi are identified. Pancreas: No mass or inflammatory changes. No evidence of pancreatic ductal dilatation or pancreas divisum. Spleen:  Within normal limits in size and appearance. Adrenals/Urinary Tract: No masses identified. Tiny sub-cm left renal cyst. No evidence of hydronephrosis. Stomach/Bowel: Visualized portions within the abdomen are unremarkable. Vascular/Lymphatic: No pathologically enlarged lymph nodes identified. No abdominal aortic aneurysm. Other:  None. Musculoskeletal: No suspicious bone lesions identified. Lumbar spine fusion hardware noted. IMPRESSION: Image degradation by motion artifact noted. Distended gallbladder contains tiny gallstones and sludge in shows mild diffuse wall thickening, suspicious for acute cholecystitis . Recommend clinical correlation, consider nuclear medicine hepatobiliary scan for further evaluation if clinically warranted. Mild biliary ductal dilatation. No radiographic evidence of choledocholithiasis or other obstructing etiology. Electronically Signed   By: Earle Gell M.D.   On: 10/30/2016 08:23   Scheduled Meds: . heparin subcutaneous  5,000 Units Subcutaneous Q8H  . HYDROmorphone      . pantoprazole (PROTONIX) IV  40 mg Intravenous QHS  . piperacillin-tazobactam (ZOSYN)  IV  3.375 g Intravenous Q8H   Continuous Infusions: . dextrose 5 % and 0.45 % NaCl with KCl 20 mEq/L       Principal Problem:   S/P laparoscopic cholecystectomy for necrotic gallbaldder Jan 2018  Time spent:   Irwin Brakeman, MD, FAAFP [facility] Pager [phone]  If 7PM-7AM, please contact night-coverage [email or web address] Password [facility] 10/30/2016, 4:33 PM    LOS: 2 days

## 2016-10-30 NOTE — Progress Notes (Signed)
MD Wynetta Emery notified of positive pcr for MRSA, orders entered Neta Mends RN 4:51 PM 10-30-2016

## 2016-10-30 NOTE — Progress Notes (Signed)
Patient ID: Melanie [name], female   DOB: [birth year], 81 y.o.   MRN: [id number]  [facility] Surgery Progress Note     Subjective: Resting comfortably. Just came back from MRCP. Family at bedside. States that she has been having abdominal pain for at least 2 weeks.  Objective: Vital signs in last 24 hours: Temp:  [98.7 F (37.1 C)-100.2 F (37.9 C)] 99.4 F (37.4 C) (01/22 0614) Pulse Rate:  [69-74] 74 (01/22 0614) Resp:  [16-18] 16 (01/22 0614) BP: (131-141)/(49-78) 131/49 (01/22 0614) SpO2:  [94 %-96 %] 94 % (01/22 0614) Last BM Date: 10/27/16  Intake/Output from previous day: 01/21 0701 - 01/22 0700 In: 1613.8 [P.O.:300; I.V.:1163.8; IV Piggyback:150] Out: 400 [Urine:400] Intake/Output this shift: No intake/output data recorded.  PE: Gen:  Drowsy, NAD, pleasant Pulm:  CTAB, no W/R/R, effort nomal Abd: Soft, ND, +BS, no HSM, global tenderness most significant in RUQ Ext:  No erythema, edema, or tenderness   Lab Results:   Recent Labs  10/28/16 0745 10/29/16 0457  WBC 16.9* 23.2*  HGB 11.9* 11.2*  HCT 36.6 35.1*  PLT 256 240   BMET  Recent Labs  10/28/16 0745 10/29/16 0457  NA 141 141  K 5.2* 3.2*  CL 105 105  CO2 26 27  GLUCOSE 108* 185*  BUN 18 15  CREATININE 0.88 0.90  CALCIUM 9.3 8.7*   PT/INR No results for input(s): LABPROT, INR in the last 72 hours. CMP     Component Value Date/Time   NA 141 10/29/2016 0457   K 3.2 (L) 10/29/2016 0457   CL 105 10/29/2016 0457   CO2 27 10/29/2016 0457   GLUCOSE 185 (H) 10/29/2016 0457   BUN 15 10/29/2016 0457   CREATININE 0.90 10/29/2016 0457   CALCIUM 8.7 (L) 10/29/2016 0457   PROT 6.7 10/29/2016 0457   ALBUMIN 3.2 (L) 10/29/2016 0457   AST 16 10/29/2016 0457   ALT 12 (L) 10/29/2016 0457   ALKPHOS 65 10/29/2016 0457   BILITOT 1.1 10/29/2016 0457   GFRNONAA 60 (L) 10/29/2016 0457   GFRAA >60 10/29/2016 0457   Lipase     Component Value Date/Time   LIPASE 19 10/28/2016 0745        Studies/Results: US Abdomen Complete  Result Date: 10/28/2016 CLINICAL DATA:  Pain. EXAM: ABDOMEN ULTRASOUND COMPLETE COMPARISON:  CT scan from earlier today and right upper quadrant ultrasound October 20, 2016 FINDINGS: Gallbladder: Sludge and layering stones seen in the gallbladder. No Murphy's sign is reported. No wall thickening or pericholecystic fluid. The gallbladder remains distended. Common bile duct: Diameter: The central common bile duct measures 7.1 mm which is stable. At the level of the pancreatic head, the common bile duct measures 11.3 mm which is distended. There may be some sludge in the gallbladder but no discrete stones are identified. Liver: No focal lesion identified. Within normal limits in parenchymal echogenicity. IVC: No abnormality visualized. Pancreas: Visualized portion unremarkable. Spleen: Size and appearance within normal limits. Right Kidney: Length: 9.6 cm. Echogenicity within normal limits. No mass or hydronephrosis visualized. Left Kidney: Length: 8.1 cm. Echogenicity within normal limits. No mass or hydronephrosis visualized. Abdominal aorta: No aneurysm visualized. Other findings: None. IMPRESSION: 1. Stones and sludge are seen in the gallbladder without wall thickening, pericholecystic fluid, or Murphy's sign. 2. The central common bile duct measures 7 mm which is stable in the interval. The distal common bile duct measures 11 mm which was not imaged previously. However, the 11 mm diameter in the distal  duct is abnormal. No discrete stones are seen in the common bile duct but there appears to be sludge. An MRCP could better evaluate the common bile duct for underlying stone or obstruction. 3. No other acute abnormalities. Electronically Signed   By: Dorise Bullion III M.D   On: 10/28/2016 12:17   Ct Abdomen Pelvis W Contrast  Result Date: 10/28/2016 CLINICAL DATA:  Worsened right upper quadrant pain with nausea and vomiting. History of gallstones. EXAM: CT  ABDOMEN AND PELVIS WITH CONTRAST TECHNIQUE: Multidetector CT imaging of the abdomen and pelvis was performed using the standard protocol following bolus administration of intravenous contrast. CONTRAST:  151mL ISOVUE-300 IOPAMIDOL (ISOVUE-300) INJECTION 61% COMPARISON:  Ultrasound 10/20/2016.  CT 06/24/2005. FINDINGS: Lower chest: Lung bases are clear.  No pleural or pericardial fluid. Hepatobiliary: The gallbladder is distended. There are some layering stones. Mild prominence of the common bile duct without visible hyperdense ductal stone. Mild fatty change of the liver with some relative sparing along the margins of the gallbladder fossa. Pancreas: Mild prominence of the pancreatic duct. No focal pancreatic lesion. Spleen: Normal Adrenals/Urinary Tract: Adrenal glands are normal. Kidneys are normal. No cyst, mass, stone or hydronephrosis. Stomach/Bowel: No visible bowel pathology. Vascular/Lymphatic: Aortic atherosclerosis.  No aneurysm. Reproductive: Retroverted uterus. Other: No free fluid or air. Musculoskeletal: Previous spinal fusion L2 through L5. Degenerative changes above and below that. IMPRESSION: The gallbladder is distended. There are some dependent stones in the gallbladder. The common duct is slightly prominent. No hyperdense stone is identified within the common duct. Mild hyperemia or fatty sparing of the liver parenchyma adjacent to the gallbladder fossa. These findings are nonspecific, but do raise the possibility of gallbladder inflammation or a common duct stone. Consider ultrasound for comparison with the study of 10/20/2016. Electronically Signed   By: Nelson Chimes M.D.   On: 10/28/2016 09:32    Anti-infectives: Anti-infectives    Start     Dose/Rate Route Frequency Ordered Stop   10/29/16 1200  piperacillin-tazobactam (ZOSYN) IVPB 3.375 g     3.375 g 12.5 mL/hr over 240 Minutes Intravenous Every 8 hours 10/29/16 0941         Assessment/Plan Symptomatic cholelithiasis  - CT  shows distended gallbladder with some stones and slightly prominent common duct - u/s shows stones and sludge in the gallbladder without wall thickening, pericholecystic fluid, or Murphy's sign - LFTs WNL Suspected choledocholithiasis - MRCP shows no radiographic evidence of choledocholithiasis or other obstructing etiology  HTN COPD GERD  ID - zosyn 1/21>> FEN - IVF, NPO VTE - SCDs, hold lovenox for procedure  Plan - MRCP negative for obstruction. Plan for lap chole today.    LOS: 2 days    [name] , [facility] Surgery 10/30/2016, 8:14 AM Pager: [phone] Consults: [phone] Mon-Fri 7:00 am-4:30 pm Sat-Sun 7:00 am-11:30 am

## 2016-10-30 NOTE — Anesthesia Postprocedure Evaluation (Signed)
Anesthesia Post Note  Patient: Melanie [name]  Procedure(s) Performed: Procedure(s) (LRB): LAPAROSCOPIC CHOLECYSTECTOMY WITH INTRAOPERATIVE CHOLANGIOGRAM (N/A)  Patient location during evaluation: PACU Anesthesia Type: General Level of consciousness: awake Pain management: pain level controlled Vital Signs Assessment: post-procedure vital signs reviewed and stable Respiratory status: spontaneous breathing Cardiovascular status: stable Anesthetic complications: no       Last Vitals:  Vitals:   10/30/16 1445 10/30/16 1500  BP: (!) 155/78 (!) 159/81  Pulse: 84 83  Resp: 12 19  Temp:      Last Pain:  Vitals:   10/30/16 1445  TempSrc:   PainSc: Asleep                 Kaily Wragg

## 2016-10-30 NOTE — Op Note (Signed)
LOYDA ZAHNER   10/30/2016  2:06 PM  Procedure: Laparoscopic Cholecystectomy with intraoperative cholangiogram  Surgeon: Catalina Antigua B. Hassell Done, MD, FACS Asst:  none  Anes:  General  Drains:  None  Findings: Necrotic gallbladder with free intrapertioneal bile; enlarged CBD with free flow into the duodenum  Description of Procedure: The patient was taken to OR 6 and given general anesthesia.  The patient was prepped with Technicare and draped sterilely. A time out was performed.  Access to the abdomen was achieved with a 5 mm Optiview through the left upper quadrant.  Port placement included three 5 m trocars and one 12 mm trocar.  The abdomen was partially walled off in the upper abdomen with bile peritonitis  The gallbladder was visualized and the fundus was large and partially intrahepatic and this was grasped and the gallbladder was elevated. Traction on the infundibulum allowed for successful demonstration of the critical view. Inflammatory changes were acute and chronic.  The cystic duct was identified and clipped up on the gallbladder and an incision was made in the cystic duct and the Reddick catheter was inserted after milking the cystic duct of any debris. A dynamic cholangiogram was performed which demonstrated a large CBD and flow into the duodenum.    The cystic duct was then triple clipped and divided, the cystic artery was double clipped and divided and then the gallbladder was removed from the gallbladder bed. Removal of the gallbladder from the gallbladder bed was tedious but accomplished without entering it.  The gallbladder was then placed in a bag and brought out through one of the trocar sites. The gallbladder bed was inspected and no bleeding or bile leaks were seen.   The 12 mm port was closed from without with 0 vicryl.    Incisions were injected with Exparel and closed with 4-0 Monocryl and Dermabond on the skin.  Sponge and needle count were correct.    The patient was taken to  the recovery room in satisfactory condition.

## 2016-10-31 ENCOUNTER — Encounter (HOSPITAL_COMMUNITY): Payer: Self-pay | Admitting: Surgery

## 2016-10-31 DIAGNOSIS — N179 Acute kidney failure, unspecified: Secondary | ICD-10-CM

## 2016-10-31 LAB — COMPREHENSIVE METABOLIC PANEL
ALK PHOS: 148 U/L — AB (ref 38–126)
ALT: 97 U/L — ABNORMAL HIGH (ref 14–54)
ANION GAP: 10 (ref 5–15)
AST: 141 U/L — ABNORMAL HIGH (ref 15–41)
Albumin: 2.8 g/dL — ABNORMAL LOW (ref 3.5–5.0)
BILIRUBIN TOTAL: 1.8 mg/dL — AB (ref 0.3–1.2)
BUN: 32 mg/dL — ABNORMAL HIGH (ref 6–20)
CALCIUM: 8.9 mg/dL (ref 8.9–10.3)
CO2: 21 mmol/L — ABNORMAL LOW (ref 22–32)
Chloride: 109 mmol/L (ref 101–111)
Creatinine, Ser: 1.16 mg/dL — ABNORMAL HIGH (ref 0.44–1.00)
GFR, EST AFRICAN AMERICAN: 51 mL/min — AB (ref >60)
GFR, EST NON AFRICAN AMERICAN: 44 mL/min — AB (ref >60)
GLUCOSE: 144 mg/dL — AB (ref 65–99)
POTASSIUM: 3.9 mmol/L (ref 3.5–5.1)
Sodium: 140 mmol/L (ref 135–145)
TOTAL PROTEIN: 7 g/dL (ref 6.5–8.1)

## 2016-10-31 LAB — CBC WITH DIFFERENTIAL/PLATELET
BASOS PCT: 0 %
Basophils Absolute: 0 10*3/uL (ref 0.0–0.1)
EOS ABS: 0 10*3/uL (ref 0.0–0.7)
EOS PCT: 0 %
HEMATOCRIT: 36.7 % (ref 36.0–46.0)
HEMOGLOBIN: 12.3 g/dL (ref 12.0–15.0)
LYMPHS PCT: 6 %
Lymphs Abs: 0.7 10*3/uL (ref 0.7–4.0)
MCH: 26.7 pg (ref 26.0–34.0)
MCHC: 33.5 g/dL (ref 30.0–36.0)
MCV: 79.6 fL (ref 78.0–100.0)
Monocytes Absolute: 0.7 10*3/uL (ref 0.1–1.0)
Monocytes Relative: 6 %
NEUTROS ABS: 10.2 10*3/uL — AB (ref 1.7–7.7)
NEUTROS PCT: 88 %
Platelets: 274 10*3/uL (ref 150–400)
RBC: 4.61 MIL/uL (ref 3.87–5.11)
RDW: 14.5 % (ref 11.5–15.5)
WBC: 11.6 10*3/uL — ABNORMAL HIGH (ref 4.0–10.5)

## 2016-10-31 MED ORDER — CHLORHEXIDINE GLUCONATE CLOTH 2 % EX PADS
6.0000 | MEDICATED_PAD | Freq: Every day | CUTANEOUS | Status: AC
  Administered 2016-11-01 – 2016-11-05 (×5): 6 via TOPICAL

## 2016-10-31 MED ORDER — MORPHINE SULFATE (PF) 2 MG/ML IV SOLN: 1.0000 mg | INTRAVENOUS | Status: DC | PRN

## 2016-10-31 MED ORDER — ACETAMINOPHEN 325 MG PO TABS
650.0000 mg | ORAL_TABLET | Freq: Four times a day (QID) | ORAL | Status: DC | PRN
  Administered 2016-10-31 – 2016-11-14 (×8): 650 mg via ORAL
  Filled 2016-10-31 (×8): qty 2

## 2016-10-31 MED ORDER — MUPIROCIN 2 % EX OINT
1.0000 "application " | TOPICAL_OINTMENT | Freq: Two times a day (BID) | CUTANEOUS | Status: AC
  Administered 2016-10-31 – 2016-11-05 (×10): 1 via NASAL
  Filled 2016-10-31: qty 22

## 2016-10-31 MED ORDER — MORPHINE SULFATE (PF) 2 MG/ML IV SOLN
1.0000 mg | INTRAVENOUS | Status: DC | PRN
  Administered 2016-10-31 – 2016-11-01 (×7): 1 mg via INTRAVENOUS
  Filled 2016-10-31 (×7): qty 1

## 2016-10-31 MED ORDER — METHOCARBAMOL 1000 MG/10ML IJ SOLN
500.0000 mg | Freq: Three times a day (TID) | INTRAVENOUS | Status: DC | PRN
  Filled 2016-10-31 (×2): qty 5

## 2016-10-31 NOTE — Progress Notes (Signed)
PROGRESS NOTE   Melanie [name]  [id number]  DOB: 01/05/1936  DOA: 10/28/2016 PCP: Osborne Casco, MD  Hospital course:  Melanie [name] is a 81 y.o. female with medical history significant of gallstones presents to the hospital with the chief complaint of abdominal pain. She has been experiencing abdominal pain for the last 4 weeks, intermittent, localized in the right upper quadrant, she has been diagnosed with gallstones and she was scheduled to see surgery next week. Last night she had an acute exacerbation of her abdominal pain, localized in the right upper quadrant, epigastric region, colicky nature, improved with pain medication but persistent, associated with nausea but no vomiting, no diarrhea, no fevers or chills. Her pain seems to be worse with meals.   ED Course: Patient was found to be in pain, supportive supportive therapy was started, further workup showed dilatation of the distal common bile duct. Was referred for admission and further evaluation.  Assessment & Plan:   1. Acute cholecystitis and Biliary colic - POD#1 s/p lap chole.  Post op mgmt per surgery team.  Pt in a lot of pain right now.  Morphine 1 mg IV every hour ordered as needed.     2. Hypertension. Exacerbated by pain, Hydralazine IV ordered as needed.  3. COPD. Stable with no exacerbation.  4. GERD. Continue omeprazole.  5.  Hypokalemia - replacing IV.  Recheck in AM  6. AKI - improving with hydration, recheck in AM   DVT prophylaxis: heparin Code Status: Full  Family Communication: Patient with family at the bedside  Disposition Plan: Home  Consults called: Ed called GI and surgery Admission status: Inpatient  Subjective: Pt is still having significant abd pain and discomfort   Objective: Vitals:   10/30/16 2035 10/31/16 0129 10/31/16 0527 10/31/16 1115  BP: (!) 154/81 (!) 160/75 (!) 160/82 (!) 157/89  Pulse: 91 91 90 91  Resp: 18 18 18    Temp: 97.7 F (36.5 C) 98.1 F (36.7 C)  97.3 F (36.3 C) 98.6 F (37 C)  TempSrc: Oral Oral Oral Oral  SpO2: 100% 94% 99% 95%  Weight:      Height:        Intake/Output Summary (Last 24 hours) at 10/31/16 1324 Last data filed at 10/31/16 1210  Gross per 24 hour  Intake             2020 ml  Output              870 ml  Net             1150 ml   Filed Weights   10/28/16 0551  Weight: 70.3 kg (155 lb)   Exam:  General appearance: alert, cooperative and appears uncomfortable. Resp: breathing comfortably GI: soft, tender to palpation, non distended, wounds clean and dry. Central nervous system: Alert and oriented. No focal neurological deficits. Extremities: no CCE.  Data Reviewed: Basic Metabolic Panel:  Recent Labs Lab 10/28/16 0745 10/29/16 0457 10/30/16 1649 10/31/16 1205  NA 141 141  --  140  K 5.2* 3.2*  --  3.9  CL 105 105  --  109  CO2 26 27  --  21*  GLUCOSE 108* 185*  --  144*  BUN 18 15  --  32*  CREATININE 0.88 0.90 1.41* 1.16*  CALCIUM 9.3 8.7*  --  8.9   Liver Function Tests:  Recent Labs Lab 10/28/16 0745 10/29/16 0457 10/31/16 1205  AST 43* 16 141*  ALT 8* 12*  97*  ALKPHOS 61 65 148*  BILITOT 1.1 1.1 1.8*  PROT 7.6 6.7 7.0  ALBUMIN 3.5 3.2* 2.8*    Recent Labs Lab 10/28/16 0745  LIPASE 19   No results for input(s): AMMONIA in the last 168 hours. CBC:  Recent Labs Lab 10/28/16 0745 10/29/16 0457 10/30/16 1649 10/31/16 1205  WBC 16.9* 23.2* 13.8* 11.6*  NEUTROABS 14.1*  --   --  10.2*  HGB 11.9* 11.2* 12.2 12.3  HCT 36.6 35.1* 38.1 36.7  MCV 83.2 82.8 82.6 79.6  PLT 256 240 258 274   Cardiac Enzymes: No results for input(s): CKTOTAL, CKMB, CKMBINDEX, TROPONINI in the last 168 hours. CBG (last 3)  No results for input(s): GLUCAP in the last 72 hours. Recent Results (from the past 240 hour(s))  Surgical PCR screen     Status: Abnormal   Collection Time: 10/30/16 10:59 AM  Result Value Ref Range Status   MRSA, PCR POSITIVE (A) NEGATIVE Final    Comment: RESULT  CALLED TO, READ BACK BY AND VERIFIED WITH: K BRACEY AT 1626 ON 01.22.2018 BY NBROOKS    Staphylococcus aureus POSITIVE (A) NEGATIVE Final    Comment:        The Xpert SA Assay (FDA approved for NASAL specimens in patients over 35 years of age), is one component of a comprehensive surveillance program.  Test performance has been validated by [facility] for patients greater than or equal to 110 year old. It is not intended to diagnose infection nor to guide or monitor treatment.      Studies: Dg Cholangiogram Operative  Result Date: 10/30/2016 CLINICAL DATA:  Right upper quadrant pain.  Cholelithiasis. EXAM: INTRAOPERATIVE CHOLANGIOGRAM TECHNIQUE: Cholangiographic images from the C-arm fluoroscopic device were submitted for interpretation post-operatively. Please see the procedural report for the amount of contrast and the fluoroscopy time utilized. COMPARISON:  MR 10/30/2016 FINDINGS: The extrahepatic biliary system is mildly dilated and there appears to be a transition point in the distal common bile duct. There is no evidence for an obstructing stone. Contrast drains into the duodenum. There is reflux into the intrahepatic biliary system. IMPRESSION: Patent biliary system. Mild dilatation of the extrahepatic biliary system and there may be a transition point in the distal common bile duct. No evidence for an obstructing stone. Findings could be related to a stricture but nonspecific. Electronically Signed   By: Markus Daft M.D.   On: 10/30/2016 13:25   Mr 3d Recon At Scanner  Result Date: 10/30/2016 CLINICAL DATA:  Acute right upper quadrant and epigastric abdominal pain with nausea. Cholelithiasis and biliary ductal dilatation seen on recent ultrasound and CT. EXAM: MRI ABDOMEN WITHOUT AND WITH CONTRAST (INCLUDING MRCP) TECHNIQUE: Multiplanar multisequence MR imaging of the abdomen was performed both before and after the administration of intravenous contrast. Heavily T2-weighted images of  the biliary and pancreatic ducts were obtained, and three-dimensional MRCP images were rendered by post processing. CONTRAST:  13mL MULTIHANCE GADOBENATE DIMEGLUMINE 529 MG/ML IV SOLN COMPARISON:  CT and ultrasound on 10/28/2016 FINDINGS: Image degradation by motion artifact noted. Lower chest: No acute findings. Hepatobiliary: The liver masses identified. Layering sludge tiny gallstones are seen in the dependent portion of the gallbladder. Gallbladder is distended and shows mild diffuse wall thickening, suspicious for acute cholecystitis. Mild biliary ductal dilatation seen with common bile duct measuring 11 mm, however no intraductal calculi are identified. Pancreas: No mass or inflammatory changes. No evidence of pancreatic ductal dilatation or pancreas divisum. Spleen:  Within normal limits in  size and appearance. Adrenals/Urinary Tract: No masses identified. Tiny sub-cm left renal cyst. No evidence of hydronephrosis. Stomach/Bowel: Visualized portions within the abdomen are unremarkable. Vascular/Lymphatic: No pathologically enlarged lymph nodes identified. No abdominal aortic aneurysm. Other:  None. Musculoskeletal: No suspicious bone lesions identified. Lumbar spine fusion hardware noted. IMPRESSION: Image degradation by motion artifact noted. Distended gallbladder contains tiny gallstones and sludge in shows mild diffuse wall thickening, suspicious for acute cholecystitis . Recommend clinical correlation, consider nuclear medicine hepatobiliary scan for further evaluation if clinically warranted. Mild biliary ductal dilatation. No radiographic evidence of choledocholithiasis or other obstructing etiology. Electronically Signed   By: Earle Gell M.D.   On: 10/30/2016 08:23   Mr Abdomen Mrcp Moise Boring Contast  Result Date: 10/30/2016 CLINICAL DATA:  Acute right upper quadrant and epigastric abdominal pain with nausea. Cholelithiasis and biliary ductal dilatation seen on recent ultrasound and CT. EXAM: MRI  ABDOMEN WITHOUT AND WITH CONTRAST (INCLUDING MRCP) TECHNIQUE: Multiplanar multisequence MR imaging of the abdomen was performed both before and after the administration of intravenous contrast. Heavily T2-weighted images of the biliary and pancreatic ducts were obtained, and three-dimensional MRCP images were rendered by post processing. CONTRAST:  57mL MULTIHANCE GADOBENATE DIMEGLUMINE 529 MG/ML IV SOLN COMPARISON:  CT and ultrasound on 10/28/2016 FINDINGS: Image degradation by motion artifact noted. Lower chest: No acute findings. Hepatobiliary: The liver masses identified. Layering sludge tiny gallstones are seen in the dependent portion of the gallbladder. Gallbladder is distended and shows mild diffuse wall thickening, suspicious for acute cholecystitis. Mild biliary ductal dilatation seen with common bile duct measuring 11 mm, however no intraductal calculi are identified. Pancreas: No mass or inflammatory changes. No evidence of pancreatic ductal dilatation or pancreas divisum. Spleen:  Within normal limits in size and appearance. Adrenals/Urinary Tract: No masses identified. Tiny sub-cm left renal cyst. No evidence of hydronephrosis. Stomach/Bowel: Visualized portions within the abdomen are unremarkable. Vascular/Lymphatic: No pathologically enlarged lymph nodes identified. No abdominal aortic aneurysm. Other:  None. Musculoskeletal: No suspicious bone lesions identified. Lumbar spine fusion hardware noted. IMPRESSION: Image degradation by motion artifact noted. Distended gallbladder contains tiny gallstones and sludge in shows mild diffuse wall thickening, suspicious for acute cholecystitis . Recommend clinical correlation, consider nuclear medicine hepatobiliary scan for further evaluation if clinically warranted. Mild biliary ductal dilatation. No radiographic evidence of choledocholithiasis or other obstructing etiology. Electronically Signed   By: Earle Gell M.D.   On: 10/30/2016 08:23   Scheduled  Meds: . heparin subcutaneous  5,000 Units Subcutaneous Q8H  . pantoprazole (PROTONIX) IV  40 mg Intravenous QHS  . piperacillin-tazobactam (ZOSYN)  IV  3.375 g Intravenous Q8H   Continuous Infusions: . dextrose 5 % and 0.45 % NaCl with KCl 20 mEq/L 100 mL/hr at 10/31/16 1130    Principal Problem:   S/P laparoscopic cholecystectomy for necrotic gallbaldder Jan 2018  Time spent:   Irwin Brakeman, MD, FAAFP [facility] Pager [phone]  If 7PM-7AM, please contact night-coverage [email or web address] Password [email or web address] 10/31/2016, 1:24 PM    LOS: 3 days

## 2016-10-31 NOTE — Progress Notes (Signed)
Patient ID: Melanie [name], female   DOB: [birth year], 81 y.o.   MRN: [id number]  [facility] Surgery Progress Note  1 Day Post-Op  Subjective: Patient slept well last night but appears very uncomfortable this morning. Family at bedside. States that she has only had a few sips of water since surgery; she does not like Boost/Ensure. Denies n/v.  Objective: Vital signs in last 24 hours: Temp:  [97.3 F (36.3 C)-98.4 F (36.9 C)] 97.3 F (36.3 C) (01/23 0527) Pulse Rate:  [75-91] 90 (01/23 0527) Resp:  [10-19] 18 (01/23 0527) BP: (102-165)/(63-86) 160/82 (01/23 0527) SpO2:  [94 %-100 %] 99 % (01/23 0527) Last BM Date: 10/27/16  Intake/Output from previous day: 01/22 0701 - 01/23 0700 In: 2020 [P.O.:120; I.V.:1850; IV Piggyback:50] Out: 570 [Urine:550; Blood:20] Intake/Output this shift: No intake/output data recorded.  PE: Gen:  Alert, NAD although appears very uncomfortable, pleasant Pulm:  CTAB, no W/R/R. Effort normal Abd: Soft, ND, +BS, appropriately tender, incisions C/D/I  Lab Results:   Recent Labs  10/29/16 0457 10/30/16 1649  WBC 23.2* 13.8*  HGB 11.2* 12.2  HCT 35.1* 38.1  PLT 240 258   BMET  Recent Labs  10/29/16 0457 10/30/16 1649  NA 141  --   K 3.2*  --   CL 105  --   CO2 27  --   GLUCOSE 185*  --   BUN 15  --   CREATININE 0.90 1.41*  CALCIUM 8.7*  --    PT/INR No results for input(s): LABPROT, INR in the last 72 hours. CMP     Component Value Date/Time   NA 141 10/29/2016 0457   K 3.2 (L) 10/29/2016 0457   CL 105 10/29/2016 0457   CO2 27 10/29/2016 0457   GLUCOSE 185 (H) 10/29/2016 0457   BUN 15 10/29/2016 0457   CREATININE 1.41 (H) 10/30/2016 1649   CALCIUM 8.7 (L) 10/29/2016 0457   PROT 6.7 10/29/2016 0457   ALBUMIN 3.2 (L) 10/29/2016 0457   AST 16 10/29/2016 0457   ALT 12 (L) 10/29/2016 0457   ALKPHOS 65 10/29/2016 0457   BILITOT 1.1 10/29/2016 0457   GFRNONAA 35 (L) 10/30/2016 1649   GFRAA 40 (L) 10/30/2016 1649   Lipase      Component Value Date/Time   LIPASE 19 10/28/2016 0745       Studies/Results: Dg Cholangiogram Operative  Result Date: 10/30/2016 CLINICAL DATA:  Right upper quadrant pain.  Cholelithiasis. EXAM: INTRAOPERATIVE CHOLANGIOGRAM TECHNIQUE: Cholangiographic images from the C-arm fluoroscopic device were submitted for interpretation post-operatively. Please see the procedural report for the amount of contrast and the fluoroscopy time utilized. COMPARISON:  MR 10/30/2016 FINDINGS: The extrahepatic biliary system is mildly dilated and there appears to be a transition point in the distal common bile duct. There is no evidence for an obstructing stone. Contrast drains into the duodenum. There is reflux into the intrahepatic biliary system. IMPRESSION: Patent biliary system. Mild dilatation of the extrahepatic biliary system and there may be a transition point in the distal common bile duct. No evidence for an obstructing stone. Findings could be related to a stricture but nonspecific. Electronically Signed   By: Markus Daft M.D.   On: 10/30/2016 13:25   Mr 3d Recon At Scanner  Result Date: 10/30/2016 CLINICAL DATA:  Acute right upper quadrant and epigastric abdominal pain with nausea. Cholelithiasis and biliary ductal dilatation seen on recent ultrasound and CT. EXAM: MRI ABDOMEN WITHOUT AND WITH CONTRAST (INCLUDING MRCP) TECHNIQUE: Multiplanar multisequence MR imaging  of the abdomen was performed both before and after the administration of intravenous contrast. Heavily T2-weighted images of the biliary and pancreatic ducts were obtained, and three-dimensional MRCP images were rendered by post processing. CONTRAST:  57mL MULTIHANCE GADOBENATE DIMEGLUMINE 529 MG/ML IV SOLN COMPARISON:  CT and ultrasound on 10/28/2016 FINDINGS: Image degradation by motion artifact noted. Lower chest: No acute findings. Hepatobiliary: The liver masses identified. Layering sludge tiny gallstones are seen in the dependent portion  of the gallbladder. Gallbladder is distended and shows mild diffuse wall thickening, suspicious for acute cholecystitis. Mild biliary ductal dilatation seen with common bile duct measuring 11 mm, however no intraductal calculi are identified. Pancreas: No mass or inflammatory changes. No evidence of pancreatic ductal dilatation or pancreas divisum. Spleen:  Within normal limits in size and appearance. Adrenals/Urinary Tract: No masses identified. Tiny sub-cm left renal cyst. No evidence of hydronephrosis. Stomach/Bowel: Visualized portions within the abdomen are unremarkable. Vascular/Lymphatic: No pathologically enlarged lymph nodes identified. No abdominal aortic aneurysm. Other:  None. Musculoskeletal: No suspicious bone lesions identified. Lumbar spine fusion hardware noted. IMPRESSION: Image degradation by motion artifact noted. Distended gallbladder contains tiny gallstones and sludge in shows mild diffuse wall thickening, suspicious for acute cholecystitis . Recommend clinical correlation, consider nuclear medicine hepatobiliary scan for further evaluation if clinically warranted. Mild biliary ductal dilatation. No radiographic evidence of choledocholithiasis or other obstructing etiology. Electronically Signed   By: Earle Gell M.D.   On: 10/30/2016 08:23   Mr Abdomen Mrcp Moise Boring Contast  Result Date: 10/30/2016 CLINICAL DATA:  Acute right upper quadrant and epigastric abdominal pain with nausea. Cholelithiasis and biliary ductal dilatation seen on recent ultrasound and CT. EXAM: MRI ABDOMEN WITHOUT AND WITH CONTRAST (INCLUDING MRCP) TECHNIQUE: Multiplanar multisequence MR imaging of the abdomen was performed both before and after the administration of intravenous contrast. Heavily T2-weighted images of the biliary and pancreatic ducts were obtained, and three-dimensional MRCP images were rendered by post processing. CONTRAST:  78mL MULTIHANCE GADOBENATE DIMEGLUMINE 529 MG/ML IV SOLN COMPARISON:  CT and  ultrasound on 10/28/2016 FINDINGS: Image degradation by motion artifact noted. Lower chest: No acute findings. Hepatobiliary: The liver masses identified. Layering sludge tiny gallstones are seen in the dependent portion of the gallbladder. Gallbladder is distended and shows mild diffuse wall thickening, suspicious for acute cholecystitis. Mild biliary ductal dilatation seen with common bile duct measuring 11 mm, however no intraductal calculi are identified. Pancreas: No mass or inflammatory changes. No evidence of pancreatic ductal dilatation or pancreas divisum. Spleen:  Within normal limits in size and appearance. Adrenals/Urinary Tract: No masses identified. Tiny sub-cm left renal cyst. No evidence of hydronephrosis. Stomach/Bowel: Visualized portions within the abdomen are unremarkable. Vascular/Lymphatic: No pathologically enlarged lymph nodes identified. No abdominal aortic aneurysm. Other:  None. Musculoskeletal: No suspicious bone lesions identified. Lumbar spine fusion hardware noted. IMPRESSION: Image degradation by motion artifact noted. Distended gallbladder contains tiny gallstones and sludge in shows mild diffuse wall thickening, suspicious for acute cholecystitis . Recommend clinical correlation, consider nuclear medicine hepatobiliary scan for further evaluation if clinically warranted. Mild biliary ductal dilatation. No radiographic evidence of choledocholithiasis or other obstructing etiology. Electronically Signed   By: Earle Gell M.D.   On: 10/30/2016 08:23    Anti-infectives: Anti-infectives    Start     Dose/Rate Route Frequency Ordered Stop   10/30/16 1600  piperacillin-tazobactam (ZOSYN) IVPB 3.375 g     3.375 g 12.5 mL/hr over 240 Minutes Intravenous Every 8 hours 10/30/16 1539     10/29/16  1200  piperacillin-tazobactam (ZOSYN) IVPB 3.375 g  Status:  Discontinued     3.375 g 12.5 mL/hr over 240 Minutes Intravenous Every 8 hours 10/29/16 0941 10/30/16 1539        Assessment/Plan Symptomatic cholelithiasis S/p lap chole with IOC 1/22 Dr. Hassell Done - POD 1 - IOC negative  HTN - hydralazine PRN COPD GERD - protonix  ID - zosyn 1/21>> FEN - IVF, heart healthy diet VTE - SCDs, heparin  Plan - Add robaxin for better pain control. encourage mobilization/IS as able. Consult PT. Labs in AM. Consider toradol if Cr ok tomorrow.   LOS: 3 days    [name] , [facility] Surgery 10/31/2016, 9:05 AM Pager: [phone] Consults: [phone] Mon-Fri 7:00 am-4:30 pm Sat-Sun 7:00 am-11:30 am

## 2016-11-01 LAB — CBC
HCT: 32.7 % — ABNORMAL LOW (ref 36.0–46.0)
Hemoglobin: 10.8 g/dL — ABNORMAL LOW (ref 12.0–15.0)
MCH: 26 pg (ref 26.0–34.0)
MCHC: 33 g/dL (ref 30.0–36.0)
MCV: 78.6 fL (ref 78.0–100.0)
PLATELETS: 238 10*3/uL (ref 150–400)
RBC: 4.16 MIL/uL (ref 3.87–5.11)
RDW: 14.4 % (ref 11.5–15.5)
WBC: 9.7 10*3/uL (ref 4.0–10.5)

## 2016-11-01 LAB — COMPREHENSIVE METABOLIC PANEL
ALT: 90 U/L — ABNORMAL HIGH (ref 14–54)
AST: 108 U/L — ABNORMAL HIGH (ref 15–41)
Albumin: 2.3 g/dL — ABNORMAL LOW (ref 3.5–5.0)
Alkaline Phosphatase: 175 U/L — ABNORMAL HIGH (ref 38–126)
Anion gap: 10 (ref 5–15)
BUN: 35 mg/dL — ABNORMAL HIGH (ref 6–20)
CHLORIDE: 109 mmol/L (ref 101–111)
CO2: 21 mmol/L — ABNORMAL LOW (ref 22–32)
CREATININE: 0.99 mg/dL (ref 0.44–1.00)
Calcium: 8.5 mg/dL — ABNORMAL LOW (ref 8.9–10.3)
GFR, EST NON AFRICAN AMERICAN: 53 mL/min — AB (ref >60)
Glucose, Bld: 136 mg/dL — ABNORMAL HIGH (ref 65–99)
POTASSIUM: 3.4 mmol/L — AB (ref 3.5–5.1)
Sodium: 140 mmol/L (ref 135–145)
Total Bilirubin: 2 mg/dL — ABNORMAL HIGH (ref 0.3–1.2)
Total Protein: 5.8 g/dL — ABNORMAL LOW (ref 6.5–8.1)

## 2016-11-01 MED ORDER — POTASSIUM CHLORIDE CRYS ER 20 MEQ PO TBCR
40.0000 meq | EXTENDED_RELEASE_TABLET | Freq: Once | ORAL | Status: AC
  Administered 2016-11-01: 40 meq via ORAL
  Filled 2016-11-01: qty 2

## 2016-11-01 MED ORDER — METHOCARBAMOL 1000 MG/10ML IJ SOLN
500.0000 mg | Freq: Three times a day (TID) | INTRAMUSCULAR | Status: DC | PRN
  Administered 2016-11-01 – 2016-11-04 (×7): 500 mg via INTRAVENOUS
  Filled 2016-11-01: qty 5
  Filled 2016-11-01: qty 550
  Filled 2016-11-01: qty 5
  Filled 2016-11-01: qty 550
  Filled 2016-11-01 (×3): qty 5
  Filled 2016-11-01: qty 550
  Filled 2016-11-01: qty 5
  Filled 2016-11-01: qty 550
  Filled 2016-11-01 (×4): qty 5
  Filled 2016-11-01: qty 550

## 2016-11-01 MED ORDER — MORPHINE SULFATE (PF) 2 MG/ML IV SOLN
1.0000 mg | INTRAVENOUS | Status: DC | PRN
  Administered 2016-11-02 – 2016-11-13 (×17): 1 mg via INTRAVENOUS
  Filled 2016-11-01 (×19): qty 1

## 2016-11-01 MED ORDER — PANTOPRAZOLE SODIUM 40 MG PO TBEC
40.0000 mg | DELAYED_RELEASE_TABLET | Freq: Every day | ORAL | Status: DC
  Administered 2016-11-01 – 2016-11-06 (×6): 40 mg via ORAL
  Filled 2016-11-01 (×6): qty 1

## 2016-11-01 MED ORDER — OXYCODONE HCL 5 MG PO TABS
5.0000 mg | ORAL_TABLET | Freq: Four times a day (QID) | ORAL | Status: DC | PRN
  Administered 2016-11-01: 5 mg via ORAL
  Administered 2016-11-01 – 2016-11-13 (×28): 10 mg via ORAL
  Filled 2016-11-01 (×18): qty 2
  Filled 2016-11-01: qty 1
  Filled 2016-11-01 (×12): qty 2

## 2016-11-01 NOTE — Progress Notes (Signed)
PHARMACIST - PHYSICIAN COMMUNICATION  DR:   CCS  CONCERNING: IV to Oral Route Change Policy  RECOMMENDATION: This patient is receiving pantoprazole by the intravenous route.  Based on criteria approved by the Pharmacy and Therapeutics Committee, the intravenous medication(s) is/are being converted to the equivalent oral dose form(s).   DESCRIPTION: These criteria include:  The patient is eating (either orally or via tube) and/or has been taking other orally administered medications for a least 24 hours  The patient has no evidence of active gastrointestinal bleeding or impaired GI absorption (gastrectomy, short bowel, patient on TNA or NPO).  If you have questions about this conversion, please contact the Pharmacy Department  []   [phone] )  [facility] []   [phone] )  [facility] []   [phone] )  [facility] []   [phone] )  [facility] [x]   [phone] )  [place], [place], [facility] 11/01/2016 12:18 PM

## 2016-11-01 NOTE — Care Management Important Message (Signed)
Important Message  Patient Details IM Letter given to Suzanne/Case Manager to present to Patient Name: VINETTA MCGAULEY MRN: [id number] Date of Birth: [birth year]   Medicare Important Message Given:  Yes    Kerin Salen 11/01/2016, 8:57 AMImportant Message  Patient Details  Name: DINARA SCIANNA MRN: [id number] Date of Birth: 05-Dec-1935   Medicare Important Message Given:  Yes    Kerin Salen 11/01/2016, 8:57 AM

## 2016-11-01 NOTE — Progress Notes (Signed)
Patient ID: Melanie [name], female   DOB: 1936/02/16, 81 y.o.   MRN: [id number]  [facility] Surgery Progress Note  2 Days Post-Op  Subjective: More awake and alert this morning. States that she slept well last night. Had large BM in bed. Abdominal pain well controlled. Not taking in very much PO. Denies n/v.  Objective: Vital signs in last 24 hours: Temp:  [98.5 F (36.9 C)-99.5 F (37.5 C)] 98.9 F (37.2 C) (01/24 0513) Pulse Rate:  [86-91] 86 (01/24 0513) Resp:  [16-18] 16 (01/24 0513) BP: (131-158)/(70-91) 131/70 (01/24 0513) SpO2:  [95 %-99 %] 99 % (01/24 0513) Last BM Date: 10/27/16  Intake/Output from previous day: 01/23 0701 - 01/24 0700 In: 2430 [P.O.:180; I.V.:2100; IV Piggyback:150] Out: 1200 [Urine:1200] Intake/Output this shift: No intake/output data recorded.  PE: Gen:  Alert, NAD, pleasant Pulm:  Effort normal Abd: Soft, ND, +BS, appropriately tender, incisions C/D/I   Lab Results:   Recent Labs  10/31/16 1205 11/01/16 0451  WBC 11.6* 9.7  HGB 12.3 10.8*  HCT 36.7 32.7*  PLT 274 238   BMET  Recent Labs  10/31/16 1205 11/01/16 0451  NA 140 140  K 3.9 3.4*  CL 109 109  CO2 21* 21*  GLUCOSE 144* 136*  BUN 32* 35*  CREATININE 1.16* 0.99  CALCIUM 8.9 8.5*   PT/INR No results for input(s): LABPROT, INR in the last 72 hours. CMP     Component Value Date/Time   NA 140 11/01/2016 0451   K 3.4 (L) 11/01/2016 0451   CL 109 11/01/2016 0451   CO2 21 (L) 11/01/2016 0451   GLUCOSE 136 (H) 11/01/2016 0451   BUN 35 (H) 11/01/2016 0451   CREATININE 0.99 11/01/2016 0451   CALCIUM 8.5 (L) 11/01/2016 0451   PROT 5.8 (L) 11/01/2016 0451   ALBUMIN 2.3 (L) 11/01/2016 0451   AST 108 (H) 11/01/2016 0451   ALT 90 (H) 11/01/2016 0451   ALKPHOS 175 (H) 11/01/2016 0451   BILITOT 2.0 (H) 11/01/2016 0451   GFRNONAA 53 (L) 11/01/2016 0451   GFRAA >60 11/01/2016 0451   Lipase     Component Value Date/Time   LIPASE 19 10/28/2016 0745        Studies/Results: Dg Cholangiogram Operative  Result Date: 10/30/2016 CLINICAL DATA:  Right upper quadrant pain.  Cholelithiasis. EXAM: INTRAOPERATIVE CHOLANGIOGRAM TECHNIQUE: Cholangiographic images from the C-arm fluoroscopic device were submitted for interpretation post-operatively. Please see the procedural report for the amount of contrast and the fluoroscopy time utilized. COMPARISON:  MR 10/30/2016 FINDINGS: The extrahepatic biliary system is mildly dilated and there appears to be a transition point in the distal common bile duct. There is no evidence for an obstructing stone. Contrast drains into the duodenum. There is reflux into the intrahepatic biliary system. IMPRESSION: Patent biliary system. Mild dilatation of the extrahepatic biliary system and there may be a transition point in the distal common bile duct. No evidence for an obstructing stone. Findings could be related to a stricture but nonspecific. Electronically Signed   By: Markus Daft M.D.   On: 10/30/2016 13:25    Anti-infectives: Anti-infectives    Start     Dose/Rate Route Frequency Ordered Stop   10/30/16 1600  piperacillin-tazobactam (ZOSYN) IVPB 3.375 g     3.375 g 12.5 mL/hr over 240 Minutes Intravenous Every 8 hours 10/30/16 1539     10/29/16 1200  piperacillin-tazobactam (ZOSYN) IVPB 3.375 g  Status:  Discontinued     3.375 g 12.5 mL/hr over  240 Minutes Intravenous Every 8 hours 10/29/16 0941 10/30/16 1539       Assessment/Plan Symptomatic cholelithiasis S/p lap chole with IOC 1/22 Dr. Hassell Done - POD 2 - IOC negative - WBC WNL 9.7, afebrile - AST/ALT trending down, bilirubin still mildly elevated  HTN - hydralazine PRN COPD GERD - protonix  ID - zosyn 1/21>>1/24 FEN - IVF, heart healthy diet VTE - SCDs, heparin  Plan - Add robaxin for better pain control and decrease morphine use; continue tylenol and use oxycodone if patient's PO intake improves. encourage mobilization/IS as able. PT  consulted. Stop zosyn. Labs in AM. Consider toradol if Cr improves.    LOS: 4 days    [name] , [facility] Surgery 11/01/2016, 8:25 AM Pager: [phone] Consults: [phone] Mon-Fri 7:00 am-4:30 pm Sat-Sun 7:00 am-11:30 am

## 2016-11-01 NOTE — Progress Notes (Signed)
PROGRESS NOTE   Melanie [name]  [id number]  DOB: [birth year]  DOA: 10/28/2016 PCP: Osborne Casco, MD  Hospital course:  Melanie [name] is a 81 y.o. female with medical history significant of gallstones presents to the hospital with the chief complaint of abdominal pain. She has been experiencing abdominal pain for the last 4 weeks, intermittent, localized in the right upper quadrant, she has been diagnosed with gallstones and she was scheduled to see surgery next week. Last night she had an acute exacerbation of her abdominal pain, localized in the right upper quadrant, epigastric region, colicky nature, improved with pain medication but persistent, associated with nausea but no vomiting, no diarrhea, no fevers or chills. Her pain seems to be worse with meals.   ED Course: Patient was found to be in pain, supportive supportive therapy was started, further workup showed dilatation of the distal common bile duct. Was referred for admission and further evaluation.  Assessment & Plan:   1. Acute cholecystitis and Biliary colic - POD#2 s/p lap chole and negative IOC 1/22.  Post op mgmt per surgery team.  As per surgery follow-up, adjusting pain medications, encourage by mouth intake, mobilization and incentive spirometry use. Stopping Zosyn.  2. Hypertension. Exacerbated by pain, Hydralazine IV ordered as needed. Fluctuating and mildly uncontrolled at times.  3. COPD. Stable with no exacerbation.  4. GERD. Continue omeprazole.  5.  Hypokalemia - replacing PO.  Recheck in AM  6. AKI - resolved after IV fluid hydration.  7. Anemia: Follow CBCs.   DVT prophylaxis: heparin Code Status: Full  Family Communication:  discussed with patient's son and daughter-in-law at bedside. Disposition Plan: Home when medically stable.   Subjective: Seen this morning. States that she is feeling better with improved pain control. Had a large BM. Tolerating by mouth but not taking much  apparently. As per RN, no acute issues.  Objective: Vitals:   10/31/16 1420 10/31/16 2141 11/01/16 0513 11/01/16 1400  BP: (!) 158/91 (!) 155/83 131/70 109/81  Pulse: 88 86 86 77  Resp:  18 16 17   Temp: 98.5 F (36.9 C) 99.5 F (37.5 C) 98.9 F (37.2 C) 99.1 F (37.3 C)  TempSrc: Oral Oral Oral Oral  SpO2: 96% 95% 99% 99%  Weight:      Height:        Intake/Output Summary (Last 24 hours) at 11/01/16 1807 Last data filed at 11/01/16 1400  Gross per 24 hour  Intake             1180 ml  Output              900 ml  Net              280 ml   Filed Weights   10/28/16 0551  Weight: 70.3 kg (155 lb)   Exam:  General appearance: alert, cooperative and appears Comfortable Resp: breathing comfortably. Clear to auscultation. GI: soft, tender to palpation, non distended, wounds clean and dry. No rigidity, guarding or rebound. Central nervous system: Alert and oriented. No focal neurological deficits. Extremities: no CCE.  Data Reviewed: Basic Metabolic Panel:  Recent Labs Lab 10/28/16 0745 10/29/16 0457 10/30/16 1649 10/31/16 1205 11/01/16 0451  NA 141 141  --  140 140  K 5.2* 3.2*  --  3.9 3.4*  CL 105 105  --  109 109  CO2 26 27  --  21* 21*  GLUCOSE 108* 185*  --  144* 136*  BUN 18 15  --  32* 35*  CREATININE 0.88 0.90 1.41* 1.16* 0.99  CALCIUM 9.3 8.7*  --  8.9 8.5*   Liver Function Tests:  Recent Labs Lab 10/28/16 0745 10/29/16 0457 10/31/16 1205 11/01/16 0451  AST 43* 16 141* 108*  ALT 8* 12* 97* 90*  ALKPHOS 61 65 148* 175*  BILITOT 1.1 1.1 1.8* 2.0*  PROT 7.6 6.7 7.0 5.8*  ALBUMIN 3.5 3.2* 2.8* 2.3*    Recent Labs Lab 10/28/16 0745  LIPASE 19   No results for input(s): AMMONIA in the last 168 hours. CBC:  Recent Labs Lab 10/28/16 0745 10/29/16 0457 10/30/16 1649 10/31/16 1205 11/01/16 0451  WBC 16.9* 23.2* 13.8* 11.6* 9.7  NEUTROABS 14.1*  --   --  10.2*  --   HGB 11.9* 11.2* 12.2 12.3 10.8*  HCT 36.6 35.1* 38.1 36.7 32.7*  MCV  83.2 82.8 82.6 79.6 78.6  PLT 256 240 258 274 238   Cardiac Enzymes: No results for input(s): CKTOTAL, CKMB, CKMBINDEX, TROPONINI in the last 168 hours. CBG (last 3)  No results for input(s): GLUCAP in the last 72 hours. Recent Results (from the past 240 hour(s))  Surgical PCR screen     Status: Abnormal   Collection Time: 10/30/16 10:59 AM  Result Value Ref Range Status   MRSA, PCR POSITIVE (A) NEGATIVE Final    Comment: RESULT CALLED TO, READ BACK BY AND VERIFIED WITH: K BRACEY AT 1626 ON 01.22.2018 BY NBROOKS    Staphylococcus aureus POSITIVE (A) NEGATIVE Final    Comment:        The Xpert SA Assay (FDA approved for NASAL specimens in patients over 36 years of age), is one component of a comprehensive surveillance program.  Test performance has been validated by [facility] for patients greater than or equal to 63 year old. It is not intended to diagnose infection nor to guide or monitor treatment.      Studies: No results found. Scheduled Meds: . Chlorhexidine Gluconate Cloth  6 each Topical Q0600  . heparin subcutaneous  5,000 Units Subcutaneous Q8H  . mupirocin ointment  1 application Nasal BID  . pantoprazole  40 mg Oral QHS  . piperacillin-tazobactam (ZOSYN)  IV  3.375 g Intravenous Q8H   Continuous Infusions: . dextrose 5 % and 0.45 % NaCl with KCl 20 mEq/L 100 mL/hr at 11/01/16 [id number]    Principal Problem:   S/P laparoscopic cholecystectomy for necrotic gallbaldder Jan 2018  [facility], MD, FACP, [facility] Pager [phone]  If 7PM-7AM, please contact night-coverage [email or web address] Password [email or web address] 11/01/2016, 6:12 PM   LOS: 4 days

## 2016-11-02 ENCOUNTER — Inpatient Hospital Stay (HOSPITAL_COMMUNITY): Payer: Medicare Other

## 2016-11-02 LAB — COMPREHENSIVE METABOLIC PANEL
ALT: 68 U/L — ABNORMAL HIGH (ref 14–54)
AST: 51 U/L — AB (ref 15–41)
Albumin: 2.2 g/dL — ABNORMAL LOW (ref 3.5–5.0)
Alkaline Phosphatase: 161 U/L — ABNORMAL HIGH (ref 38–126)
Anion gap: 8 (ref 5–15)
BUN: 30 mg/dL — ABNORMAL HIGH (ref 6–20)
CHLORIDE: 109 mmol/L (ref 101–111)
CO2: 22 mmol/L (ref 22–32)
Calcium: 8.5 mg/dL — ABNORMAL LOW (ref 8.9–10.3)
Creatinine, Ser: 0.82 mg/dL (ref 0.44–1.00)
Glucose, Bld: 105 mg/dL — ABNORMAL HIGH (ref 65–99)
POTASSIUM: 3.7 mmol/L (ref 3.5–5.1)
Sodium: 139 mmol/L (ref 135–145)
Total Bilirubin: 2.1 mg/dL — ABNORMAL HIGH (ref 0.3–1.2)
Total Protein: 5.7 g/dL — ABNORMAL LOW (ref 6.5–8.1)

## 2016-11-02 LAB — CBC
HCT: 32.2 % — ABNORMAL LOW (ref 36.0–46.0)
Hemoglobin: 10.9 g/dL — ABNORMAL LOW (ref 12.0–15.0)
MCH: 26.3 pg (ref 26.0–34.0)
MCHC: 33.9 g/dL (ref 30.0–36.0)
MCV: 77.8 fL — AB (ref 78.0–100.0)
PLATELETS: 221 10*3/uL (ref 150–400)
RBC: 4.14 MIL/uL (ref 3.87–5.11)
RDW: 14.4 % (ref 11.5–15.5)
WBC: 10.8 10*3/uL — AB (ref 4.0–10.5)

## 2016-11-02 LAB — LIPASE, BLOOD: LIPASE: 13 U/L (ref 11–51)

## 2016-11-02 MED ORDER — IOPAMIDOL (ISOVUE-300) INJECTION 61%
INTRAVENOUS | Status: AC
  Filled 2016-11-02: qty 100

## 2016-11-02 MED ORDER — IOPAMIDOL (ISOVUE-300) INJECTION 61%: 15.0000 mL | Freq: Two times a day (BID) | INTRAVENOUS | Status: DC | PRN

## 2016-11-02 MED ORDER — NEBIVOLOL HCL 5 MG PO TABS
5.0000 mg | ORAL_TABLET | Freq: Every day | ORAL | Status: DC
  Administered 2016-11-02 – 2016-11-16 (×14): 5 mg via ORAL
  Filled 2016-11-02 (×20): qty 1

## 2016-11-02 MED ORDER — IOPAMIDOL (ISOVUE-300) INJECTION 61%
INTRAVENOUS | Status: AC
  Filled 2016-11-02: qty 30

## 2016-11-02 MED ORDER — IOPAMIDOL (ISOVUE-300) INJECTION 61%
100.0000 mL | Freq: Once | INTRAVENOUS | Status: AC | PRN
  Administered 2016-11-02: 100 mL via INTRAVENOUS

## 2016-11-02 MED ORDER — ENSURE ENLIVE PO LIQD
237.0000 mL | Freq: Two times a day (BID) | ORAL | Status: DC
  Administered 2016-11-02 – 2016-11-03 (×3): 237 mL via ORAL

## 2016-11-02 MED ORDER — AMLODIPINE BESYLATE 5 MG PO TABS
5.0000 mg | ORAL_TABLET | Freq: Every day | ORAL | Status: DC
  Administered 2016-11-02 – 2016-11-04 (×3): 5 mg via ORAL
  Filled 2016-11-02 (×3): qty 1

## 2016-11-02 NOTE — Evaluation (Addendum)
Physical Therapy Evaluation Patient Details Name: Melanie [name] MRN: [id number] DOB: 13-Jan-1936 Today's Date: 11/02/2016   History of Present Illness  81 y.o. female with medical history significant of gallstones presents to the hospital with the chief complaint of abdominal pain. s/p laparoscopic cholecystectomy 10/30/16.  Clinical Impression  Pt admitted with above diagnosis. Pt currently with functional limitations due to the deficits listed below (see PT Problem List). Pt ambulated 96' with RW and min/guard assist. Activity tolerance limited by abdominal pain. She will have 24* assist from family upon acute DC. Good progress expected once pain resolves.  Pt will benefit from skilled PT to increase their independence and safety with mobility to allow discharge to the venue listed below.       Follow Up Recommendations Home health PT; Home health aide for bathing/dressing (pt's son will be providing care at home, pt prefers female for bathing assistance)    Equipment Recommendations  Rolling walker with 5" wheels; shower seat   Recommendations for Other Services       Precautions / Restrictions Precautions Precautions: Fall Precaution Comments: pt/family deny h/o falls in past 1 year; abdominal surgery Restrictions Weight Bearing Restrictions: No      Mobility  Bed Mobility Overal bed mobility: Needs Assistance Bed Mobility: Rolling;Sidelying to Sit Rolling: Supervision Sidelying to sit: Supervision       General bed mobility comments: increased time but no physical assist, VCs for log roll technique  Transfers Overall transfer level: Needs assistance Equipment used: Rolling walker (2 wheeled) Transfers: Sit to/from Stand Sit to Stand: Min assist         General transfer comment: increased time, min A to rise, VCs hand placement  Ambulation/Gait Ambulation/Gait assistance: Min guard Ambulation Distance (Feet): 55 Feet Assistive device: Rolling walker (2  wheeled) Gait Pattern/deviations: Step-through pattern;Decreased step length - right;Decreased step length - left;Trunk flexed Gait velocity: decr Gait velocity interpretation: Below normal speed for age/gender General Gait Details: VCs for posture, min/guard for safety, increased time  Stairs            Wheelchair Mobility    Modified Rankin (Stroke Patients Only)       Balance Overall balance assessment: Needs assistance   Sitting balance-Leahy Scale: Good       Standing balance-Leahy Scale: Fair                               Pertinent Vitals/Pain Pain Assessment: 0-10 Pain Score: 5  Pain Location: abdomen Pain Descriptors / Indicators: Aching Pain Intervention(s): Limited activity within patient's tolerance;Monitored during session;Premedicated before session;Repositioned    Home Living Family/patient expects to be discharged to:: Private residence Living Arrangements: Alone Available Help at Discharge: Family;Available 24 hours/day   Home Access: Level entry     Home Layout: One level Home Equipment: Toilet riser;Cane - single point      Prior Function Level of Independence: Independent with assistive device(s)         Comments: walks with SPC     Hand Dominance        Extremity/Trunk Assessment   Upper Extremity Assessment Upper Extremity Assessment: Overall WFL for tasks assessed    Lower Extremity Assessment Lower Extremity Assessment: Overall WFL for tasks assessed (knee ext 4/5 B)    Cervical / Trunk Assessment Cervical / Trunk Assessment: Normal  Communication   Communication: No difficulties  Cognition Arousal/Alertness: Awake/alert Behavior During Therapy: WFL for tasks assessed/performed Overall  Cognitive Status: Impaired/Different from baseline Area of Impairment: Memory     Memory: Decreased short-term memory              General Comments      Exercises     Assessment/Plan    PT Assessment  Patient needs continued PT services  PT Problem List Decreased mobility;Decreased balance;Decreased knowledge of use of DME;Pain          PT Treatment Interventions DME instruction;Gait training;Functional mobility training;Therapeutic exercise;Therapeutic activities;Patient/family education;Balance training    PT Goals (Current goals can be found in the Care Plan section)  Acute Rehab PT Goals Patient Stated Goal: return home to prior level of function PT Goal Formulation: With patient/family Time For Goal Achievement: 11/16/16 Potential to Achieve Goals: Good    Frequency Min 3X/week   Barriers to discharge        Co-evaluation               End of Session Equipment Utilized During Treatment: Gait belt Activity Tolerance: Patient limited by pain Patient left: in chair;with call bell/phone within reach;with family/visitor present Nurse Communication: Mobility status         Time: 1339-1415 PT Time Calculation (min) (ACUTE ONLY): 36 min   Charges:   PT Evaluation $PT Eval Low Complexity: 1 Procedure PT Treatments $Gait Training: 8-22 mins   PT G Codes:        Philomena Doheny 11/02/2016, 2:23 PM [phone]

## 2016-11-02 NOTE — Progress Notes (Signed)
Patient ID: Melanie [name], female   DOB: Jul 20, 1936, 81 y.o.   MRN: [id number]  [facility] Surgery Progress Note  3 Days Post-Op  Subjective: Patient ambulated short distance down hall yesterday. She had multiple BM's yesterday, and loose BM this morning. Denies n/v. Continues to complain of intermittent crampy abdominal pain. Not taking in very much of her diet.  Objective: Vital signs in last 24 hours: Temp:  [98.9 F (37.2 C)-99.1 F (37.3 C)] 99.1 F (37.3 C) (01/25 0604) Pulse Rate:  [77-82] 82 (01/25 0604) Resp:  [16-18] 18 (01/25 0604) BP: (109-150)/(78-86) 150/86 (01/25 0604) SpO2:  [97 %-99 %] 97 % (01/25 0604) Last BM Date: 11/02/16  Intake/Output from previous day: 01/24 0701 - 01/25 0700 In: 2920 [P.O.:360; I.V.:2400; IV Piggyback:160] Out: 700 [Urine:700] Intake/Output this shift: No intake/output data recorded.  PE: Gen: alert and oriented to person and place, NAD but complaining of intermittent crampy abdominal pain, pleasant Pulm: CTAB, diminished sounds bilateral bases,effort normal Abd: Soft, ND, +BS, appropriately tender, incisions C/D/I   Lab Results:   Recent Labs  11/01/16 0451 11/02/16 0455  WBC 9.7 10.8*  HGB 10.8* 10.9*  HCT 32.7* 32.2*  PLT 238 221   BMET  Recent Labs  11/01/16 0451 11/02/16 0455  NA 140 139  K 3.4* 3.7  CL 109 109  CO2 21* 22  GLUCOSE 136* 105*  BUN 35* 30*  CREATININE 0.99 0.82  CALCIUM 8.5* 8.5*   PT/INR No results for input(s): LABPROT, INR in the last 72 hours. CMP     Component Value Date/Time   NA 139 11/02/2016 0455   K 3.7 11/02/2016 0455   CL 109 11/02/2016 0455   CO2 22 11/02/2016 0455   GLUCOSE 105 (H) 11/02/2016 0455   BUN 30 (H) 11/02/2016 0455   CREATININE 0.82 11/02/2016 0455   CALCIUM 8.5 (L) 11/02/2016 0455   PROT 5.7 (L) 11/02/2016 0455   ALBUMIN 2.2 (L) 11/02/2016 0455   AST 51 (H) 11/02/2016 0455   ALT 68 (H) 11/02/2016 0455   ALKPHOS 161 (H) 11/02/2016 0455   BILITOT 2.1  (H) 11/02/2016 0455   GFRNONAA >60 11/02/2016 0455   GFRAA >60 11/02/2016 0455   Lipase     Component Value Date/Time   LIPASE 19 10/28/2016 0745       Studies/Results: No results found.  Anti-infectives: Anti-infectives    Start     Dose/Rate Route Frequency Ordered Stop   10/30/16 1600  piperacillin-tazobactam (ZOSYN) IVPB 3.375 g  Status:  Discontinued     3.375 g 12.5 mL/hr over 240 Minutes Intravenous Every 8 hours 10/30/16 1539 11/01/16 1814   10/29/16 1200  piperacillin-tazobactam (ZOSYN) IVPB 3.375 g  Status:  Discontinued     3.375 g 12.5 mL/hr over 240 Minutes Intravenous Every 8 hours 10/29/16 0941 10/30/16 1539       Assessment/Plan Symptomatic cholelithiasis S/p lap chole with IOC 1/22 Dr. Hassell Done - POD 3 - IOC negative - WBC 10.8, TMAX 99.1 - AST/ALT trending down, bilirubin still mildly elevated 2.1  HTN - hydralazine PRN COPD GERD- protonix  ID - zosyn 1/21>>1/24 FEN - IVF, heart healthy diet VTE - SCDs, heparin  Plan - Some persistent crampy abdominal pain. If she has more loose stools today will check for c diff. Continue to encourage mobilization/PT. Add ensure. bilirubin still mildly elevated 2.1. Labs in AM.    LOS: 5 days    [name] , [facility] Surgery 11/02/2016, 7:57 AM Pager:  [phone] Consults: [phone] Mon-Fri 7:00 am-4:30 pm Sat-Sun 7:00 am-11:30 am

## 2016-11-02 NOTE — Progress Notes (Addendum)
PROGRESS NOTE   Melanie [name]  [id number]  DOB: 1936/09/16  DOA: 10/28/2016 PCP: Osborne Casco, MD  Hospital course:  Melanie [name] is a 81 y.o. female with medical history significant of gallstones presents to the hospital with the chief complaint of abdominal pain.  she had been experiencing abdominal pain for 4 weeks prior to this admission, had been diagnosed with gallstones and was scheduled to see general surgery this week. However due to worsening of her symptoms, she presented to the ED on 10/28/16. General surgery was consulted. She was assessed to have symptomatic cholelithiasis and choledocholithiasis. MRCP was negative for obstruction. She underwent laparoscopic cholecystectomy with IOC on 10/30/16 which showed necrotic gallbladder with free intraperitoneal bile, enlarged CBD with free flow into the duodenum. Slow postop progress. General surgery continues to follow.  Assessment & Plan:   1. Acute cholecystitis and Biliary colic -  s/p lap chole and negative IOC on 1/22.  Post op mgmt per surgery team.  As per surgery follow-up, adjusting pain medications, encourage by mouth intake, mobilization and incentive spirometry use. Stopped Zosyn on 1/24 as per surgery recommendations. Some worsening abdominal pain and diarrhea on 1/25 and if diarrhea persists, consider checking for C. difficile. Persistent mildly abnormal LFTs of unclear etiology. Monitor CMP.  2. Hypertension. Exacerbated by pain, Hydralazine IV ordered as needed. Fluctuating and mildly uncontrolled at times. Start Amlodipine and Bystolic per home dose. Hold home clonidine and HCTZ for now.  3. COPD. Stable with no exacerbation.  4. GERD. Continue omeprazole.  5.  Hypokalemia - replaced. Follow periodically.  6. AKI - resolved after IV fluid hydration.  7. Anemia: Stable.   DVT prophylaxis: heparin Code Status: Full  Family Communication:  none at bedside today. Disposition Plan: Home when medically  stable.   Subjective: Seen this morning. Complained of more cramping abdominal pain and diarrhea last night and this morning. Appetite not much. No chest pain or dyspnea reported.  Objective: Vitals:   11/01/16 2114 11/02/16 0604 11/02/16 1451 11/02/16 1600  BP: (!) 144/78 (!) 150/86 (!) 166/85 (!) 196/102  Pulse: 81 82 79   Resp: 16 18 18    Temp: 98.9 F (37.2 C) 99.1 F (37.3 C)    TempSrc: Oral Oral    SpO2: 98% 97% 98%   Weight:      Height:        Intake/Output Summary (Last 24 hours) at 11/02/16 1717 Last data filed at 11/02/16 1600  Gross per 24 hour  Intake             2715 ml  Output              751 ml  Net             1964 ml   Filed Weights   10/28/16 0551  Weight: 70.3 kg (155 lb)   Exam:  General appearance: alert, cooperative and appears Comfortable Resp: Clear to auscultation.No increased work of breathing. GI: soft, tender to palpation, non distended, wounds clean and dry. No rigidity, guarding or rebound. Normal bowel sounds heard. Central nervous system: Alert and oriented. No focal neurological deficits. Extremities: no CCE.  Data Reviewed: Basic Metabolic Panel:  Recent Labs Lab 10/28/16 0745 10/29/16 0457 10/30/16 1649 10/31/16 1205 11/01/16 0451 11/02/16 0455  NA 141 141  --  140 140 139  K 5.2* 3.2*  --  3.9 3.4* 3.7  CL 105 105  --  109 109 109  CO2 26 27  --  21* 21* 22  GLUCOSE 108* 185*  --  144* 136* 105*  BUN 18 15  --  32* 35* 30*  CREATININE 0.88 0.90 1.41* 1.16* 0.99 0.82  CALCIUM 9.3 8.7*  --  8.9 8.5* 8.5*   Liver Function Tests:  Recent Labs Lab 10/28/16 0745 10/29/16 0457 10/31/16 1205 11/01/16 0451 11/02/16 0455  AST 43* 16 141* 108* 51*  ALT 8* 12* 97* 90* 68*  ALKPHOS 61 65 148* 175* 161*  BILITOT 1.1 1.1 1.8* 2.0* 2.1*  PROT 7.6 6.7 7.0 5.8* 5.7*  ALBUMIN 3.5 3.2* 2.8* 2.3* 2.2*    Recent Labs Lab 10/28/16 0745 11/02/16 0455  LIPASE 19 13   No results for input(s): AMMONIA in the last 168  hours. CBC:  Recent Labs Lab 10/28/16 0745 10/29/16 0457 10/30/16 1649 10/31/16 1205 11/01/16 0451 11/02/16 0455  WBC 16.9* 23.2* 13.8* 11.6* 9.7 10.8*  NEUTROABS 14.1*  --   --  10.2*  --   --   HGB 11.9* 11.2* 12.2 12.3 10.8* 10.9*  HCT 36.6 35.1* 38.1 36.7 32.7* 32.2*  MCV 83.2 82.8 82.6 79.6 78.6 77.8*  PLT 256 240 258 274 238 221   Cardiac Enzymes: No results for input(s): CKTOTAL, CKMB, CKMBINDEX, TROPONINI in the last 168 hours. CBG (last 3)  No results for input(s): GLUCAP in the last 72 hours. Recent Results (from the past 240 hour(s))  Surgical PCR screen     Status: Abnormal   Collection Time: 10/30/16 10:59 AM  Result Value Ref Range Status   MRSA, PCR POSITIVE (A) NEGATIVE Final    Comment: RESULT CALLED TO, READ BACK BY AND VERIFIED WITH: K BRACEY AT 1626 ON 01.22.2018 BY NBROOKS    Staphylococcus aureus POSITIVE (A) NEGATIVE Final    Comment:        The Xpert SA Assay (FDA approved for NASAL specimens in patients over 60 years of age), is one component of a comprehensive surveillance program.  Test performance has been validated by [facility] for patients greater than or equal to 80 year old. It is not intended to diagnose infection nor to guide or monitor treatment.      Studies: No results found. Scheduled Meds: . Chlorhexidine Gluconate Cloth  6 each Topical Q0600  . feeding supplement (ENSURE ENLIVE)  237 mL Oral BID BM  . heparin subcutaneous  5,000 Units Subcutaneous Q8H  . iopamidol      . iopamidol      . mupirocin ointment  1 application Nasal BID  . pantoprazole  40 mg Oral QHS   Continuous Infusions: . dextrose 5 % and 0.45 % NaCl with KCl 20 mEq/L 100 mL/hr at 11/02/16 0535    Principal Problem:   S/P laparoscopic cholecystectomy for necrotic gallbaldder Jan 2018  [facility], MD, FACP, [facility]. [facility] Pager [phone]  If 7PM-7AM, please contact night-coverage [email or web address] Password [email or web address] 11/02/2016, 5:17  PM   LOS: 5 days

## 2016-11-03 ENCOUNTER — Inpatient Hospital Stay (HOSPITAL_COMMUNITY): Payer: Medicare Other

## 2016-11-03 LAB — CBC
HEMATOCRIT: 33.1 % — AB (ref 36.0–46.0)
Hemoglobin: 11.3 g/dL — ABNORMAL LOW (ref 12.0–15.0)
MCH: 26.1 pg (ref 26.0–34.0)
MCHC: 34.1 g/dL (ref 30.0–36.0)
MCV: 76.4 fL — ABNORMAL LOW (ref 78.0–100.0)
Platelets: 208 10*3/uL (ref 150–400)
RBC: 4.33 MIL/uL (ref 3.87–5.11)
RDW: 14.3 % (ref 11.5–15.5)
WBC: 16.7 10*3/uL — ABNORMAL HIGH (ref 4.0–10.5)

## 2016-11-03 LAB — URINALYSIS, ROUTINE W REFLEX MICROSCOPIC
Glucose, UA: NEGATIVE mg/dL
HGB URINE DIPSTICK: NEGATIVE
Ketones, ur: NEGATIVE mg/dL
Leukocytes, UA: NEGATIVE
NITRITE: NEGATIVE
PROTEIN: 30 mg/dL — AB
SPECIFIC GRAVITY, URINE: 1.035 — AB (ref 1.005–1.030)
pH: 5 (ref 5.0–8.0)

## 2016-11-03 LAB — COMPREHENSIVE METABOLIC PANEL
ALBUMIN: 2.3 g/dL — AB (ref 3.5–5.0)
ALK PHOS: 171 U/L — AB (ref 38–126)
ALT: 50 U/L (ref 14–54)
AST: 30 U/L (ref 15–41)
Anion gap: 9 (ref 5–15)
BILIRUBIN TOTAL: 1.2 mg/dL (ref 0.3–1.2)
BUN: 21 mg/dL — ABNORMAL HIGH (ref 6–20)
CALCIUM: 8.6 mg/dL — AB (ref 8.9–10.3)
CO2: 23 mmol/L (ref 22–32)
Chloride: 107 mmol/L (ref 101–111)
Creatinine, Ser: 0.53 mg/dL (ref 0.44–1.00)
GFR calc Af Amer: >60 mL/min (ref >60)
GFR calc non Af Amer: >60 mL/min (ref >60)
GLUCOSE: 119 mg/dL — AB (ref 65–99)
Potassium: 3.4 mmol/L — ABNORMAL LOW (ref 3.5–5.1)
Sodium: 139 mmol/L (ref 135–145)
TOTAL PROTEIN: 6 g/dL — AB (ref 6.5–8.1)

## 2016-11-03 MED ORDER — POTASSIUM CHLORIDE CRYS ER 20 MEQ PO TBCR
40.0000 meq | EXTENDED_RELEASE_TABLET | Freq: Once | ORAL | Status: AC
  Administered 2016-11-03: 40 meq via ORAL
  Filled 2016-11-03: qty 2

## 2016-11-03 NOTE — Progress Notes (Signed)
Patient ID: Melanie [name], female   DOB: [birth year], 81 y.o.   MRN: [id number]  [facility] Surgery Progress Note  4 Days Post-Op  Subjective: Feeling a little bit better today than yesterday, although still having some crampy abdominal pain. CT yesterday showed no concern for infection or leak. WBC going up. States that she has a productive cough. Coughing up frothy white mucus. Denies CP or SOB. Denies dysuria. Appetite still suppressed.  Objective: Vital signs in last 24 hours: Temp:  [97.8 F (36.6 C)-98.2 F (36.8 C)] 98.2 F (36.8 C) (01/26 0524) Pulse Rate:  [79-106] 87 (01/26 0626) Resp:  [14-18] 14 (01/26 0524) BP: (121-196)/(66-150) 145/110 (01/26 0626) SpO2:  [98 %-100 %] 98 % (01/26 0524) Last BM Date: 11/03/16  Intake/Output from previous day: 01/25 0701 - 01/26 0700 In: 2518.8 [P.O.:320; I.V.:2088.8; IV Piggyback:110] Out: 451 [Urine:451] Intake/Output this shift: No intake/output data recorded.  PE: Gen: alert, NAD,pleasant Pulm: CTAB, diminished sounds bilateral bases,effort normal Abd: Soft, ND, +BS, appropriately tender, incisions C/D/I  Lab Results:   Recent Labs  11/02/16 0455 11/03/16 0506  WBC 10.8* 16.7*  HGB 10.9* 11.3*  HCT 32.2* 33.1*  PLT 221 208   BMET  Recent Labs  11/02/16 0455 11/03/16 0506  NA 139 139  K 3.7 3.4*  CL 109 107  CO2 22 23  GLUCOSE 105* 119*  BUN 30* 21*  CREATININE 0.82 0.53  CALCIUM 8.5* 8.6*   PT/INR No results for input(s): LABPROT, INR in the last 72 hours. CMP     Component Value Date/Time   NA 139 11/03/2016 0506   K 3.4 (L) 11/03/2016 0506   CL 107 11/03/2016 0506   CO2 23 11/03/2016 0506   GLUCOSE 119 (H) 11/03/2016 0506   BUN 21 (H) 11/03/2016 0506   CREATININE 0.53 11/03/2016 0506   CALCIUM 8.6 (L) 11/03/2016 0506   PROT 6.0 (L) 11/03/2016 0506   ALBUMIN 2.3 (L) 11/03/2016 0506   AST 30 11/03/2016 0506   ALT 50 11/03/2016 0506   ALKPHOS 171 (H) 11/03/2016 0506   BILITOT 1.2  11/03/2016 0506   GFRNONAA >60 11/03/2016 0506   GFRAA >60 11/03/2016 0506   Lipase     Component Value Date/Time   LIPASE 13 11/02/2016 0455       Studies/Results: Ct Abdomen Pelvis W Contrast  Result Date: 11/02/2016 CLINICAL DATA:  Postoperative abdominal pain, status post cholecystectomy on 10/30/2016. EXAM: CT ABDOMEN AND PELVIS WITH CONTRAST TECHNIQUE: Multidetector CT imaging of the abdomen and pelvis was performed using the standard protocol following bolus administration of intravenous contrast. CONTRAST:  138mL ISOVUE-300 IOPAMIDOL (ISOVUE-300) INJECTION 61% COMPARISON:  10/28/2016. FINDINGS: Lower chest: Minimal right basilar atelectasis. Hepatobiliary: Cholecystectomy clips. Small amount of postoperative fluid in the gallbladder bed without abnormal enhancement. Unremarkable liver. Pancreas: Mild diffuse atrophy. Spleen: Unremarkable. Adrenals/Urinary Tract: Adrenal glands are unremarkable. Small left renal cyst. Otherwise, the kidneys are normal, without renal calculi, focal lesion, or hydronephrosis. Bladder is unremarkable. Stomach/Bowel: Multiple dilated small bowel loops. Normal caliber stomach and colon. No evidence of appendicitis. Vascular/Lymphatic: Dense atheromatous arterial calcifications, including the abdominal aorta. No enlarged lymph nodes. Reproductive: The Retroverted and mildly heterogeneous uterus containing a small amount of calcification. No adnexal masses. Other: Diffuse subcutaneous edema.  Minimal ascites. Musculoskeletal: Laminectomy defects and pedicle screw and rod fixation at the L2 through L5 levels with normal alignment. Lower thoracic spine degenerative changes. IMPRESSION: 1. Dilated small bowel, compatible postoperative ileus. 2. Minimal ascites and small amount of postoperative  fluid in the gallbladder bed, within normal limits of normal fluid following surgery. 3. Fibroid uterus. 4. Aortic atherosclerosis. Electronically Signed   By: Claudie Revering M.D.    On: 11/02/2016 17:42    Anti-infectives: Anti-infectives    Start     Dose/Rate Route Frequency Ordered Stop   10/30/16 1600  piperacillin-tazobactam (ZOSYN) IVPB 3.375 g  Status:  Discontinued     3.375 g 12.5 mL/hr over 240 Minutes Intravenous Every 8 hours 10/30/16 1539 11/01/16 1814   10/29/16 1200  piperacillin-tazobactam (ZOSYN) IVPB 3.375 g  Status:  Discontinued     3.375 g 12.5 mL/hr over 240 Minutes Intravenous Every 8 hours 10/29/16 0941 10/30/16 1539       Assessment/Plan Symptomatic cholelithiasis S/p lap chole with IOC 1/22 Dr. Hassell Done - POD 4 - IOC negative - LFT's WNL today - CT scan 1/25 showed no acute surgical complications, possible ileus - WBC elevated to 16.7 today. Check CXR, u/a, c diff  HTN - hydralazine PRN COPD GERD- protonix  ID - zosyn 1/21>>1/24 FEN - IVF, heart healthy diet VTE - SCDs, heparin  Plan - Increased leukocytosis. Abd CT scan yesterday only showed ileus. Check CXR, u/a, and c diff. Continue to encourage mobilization/PT/IS.    LOS: 6 days    [name] , [facility] Surgery 11/03/2016, 8:34 AM Pager: [phone] Consults: [phone] Mon-Fri 7:00 am-4:30 pm Sat-Sun 7:00 am-11:30 am

## 2016-11-03 NOTE — Progress Notes (Signed)
PROGRESS NOTE   Melanie [name]  [id number]  DOB: 1936-04-19  DOA: 10/28/2016 PCP: Osborne Casco, MD  Hospital course:  Melanie [name] is a 81 y.o. female with medical history significant of gallstones presents to the hospital with the chief complaint of abdominal pain.  she had been experiencing abdominal pain for 4 weeks prior to this admission, had been diagnosed with gallstones and was scheduled to see general surgery this week. However due to worsening of her symptoms, she presented to the ED on 10/28/16. General surgery was consulted. She was assessed to have symptomatic cholelithiasis and choledocholithiasis. MRCP was negative for obstruction. She underwent laparoscopic cholecystectomy with IOC on 10/30/16 which showed necrotic gallbladder with free intraperitoneal bile, enlarged CBD with free flow into the duodenum. Slow postop progress. General surgery continues to follow.  Assessment & Plan:   1. Acute cholecystitis and Biliary colic -  s/p lap chole and negative IOC on 1/22.  Post op mgmt per surgery team.  As per surgery follow-up, adjusting pain medications, encourage by mouth intake, mobilization and incentive spirometry use. Stopped Zosyn on 1/24 as per surgery recommendations. Ongoing abdominal pain although better compared to yesterday, watery diarrhea but also seems better today, poor appetite. No high fevers but increasing leukocytosis. Although some cough reported, chest x-ray negative for acute findings. Transiently elevated LFTs have improved. CT abdomen/pelvis with contrast 11/02/16 suggest postoperative ileus and diarrhea may be related to recovering ileus and no other acute findings. Checking urine microscopy and C. difficile. Encouraged oral intake and mobilization.  2. Hypertension. Exacerbated by pain, Hydralazine IV ordered as needed. Fluctuating and mildly uncontrolled at times. Start Amlodipine and Bystolic per home dose. Hold home clonidine and HCTZ for now. Blood  pressures better after medications. May have to increase amlodipine.  3. COPD. Stable with no exacerbation.  4. GERD. Continue omeprazole.  5.  Hypokalemia - replace and follow as needed  6. AKI - resolved after IV fluid hydration.  7. Anemia: Stable.   DVT prophylaxis: heparin Code Status: Full  Family Communication:  none at bedside today. Disposition Plan: Home when medically stable.   Subjective: Ongoing abdominal pain although better compared to yesterday, watery diarrhea but also seems better today, poor appetite. Mild intermittent dry cough without dyspnea or chest pain.  Objective: Vitals:   11/03/16 0230 11/03/16 0524 11/03/16 0626 11/03/16 0900  BP: (!) 121/96 (!) 188/66 (!) 145/110 (!) 156/59  Pulse: 86 82 87   Resp:  14    Temp:  98.2 F (36.8 C)    TempSrc:  Oral    SpO2:  98%    Weight:      Height:        Intake/Output Summary (Last 24 hours) at 11/03/16 1622 Last data filed at 11/03/16 0600  Gross per 24 hour  Intake          1643.75 ml  Output                0 ml  Net          1643.75 ml   Filed Weights   10/28/16 0551  Weight: 70.3 kg (155 lb)   Exam:  General appearance: alert, cooperative and appears Comfortable Resp: Clear to auscultation.No increased work of breathing. GI: soft, tender to palpation, non distended, wounds clean and dry. No rigidity, guarding or rebound. Normal bowel sounds heard. Abdominal distention seems better today. Central nervous system: Alert and oriented. No focal neurological deficits. Extremities: no CCE.  Data  Reviewed: Basic Metabolic Panel:  Recent Labs Lab 10/29/16 0457 10/30/16 1649 10/31/16 1205 11/01/16 0451 11/02/16 0455 11/03/16 0506  NA 141  --  140 140 139 139  K 3.2*  --  3.9 3.4* 3.7 3.4*  CL 105  --  109 109 109 107  CO2 27  --  21* 21* 22 23  GLUCOSE 185*  --  144* 136* 105* 119*  BUN 15  --  32* 35* 30* 21*  CREATININE 0.90 1.41* 1.16* 0.99 0.82 0.53  CALCIUM 8.7*  --  8.9 8.5*  8.5* 8.6*   Liver Function Tests:  Recent Labs Lab 10/29/16 0457 10/31/16 1205 11/01/16 0451 11/02/16 0455 11/03/16 0506  AST 16 141* 108* 51* 30  ALT 12* 97* 90* 68* 50  ALKPHOS 65 148* 175* 161* 171*  BILITOT 1.1 1.8* 2.0* 2.1* 1.2  PROT 6.7 7.0 5.8* 5.7* 6.0*  ALBUMIN 3.2* 2.8* 2.3* 2.2* 2.3*    Recent Labs Lab 10/28/16 0745 11/02/16 0455  LIPASE 19 13   No results for input(s): AMMONIA in the last 168 hours. CBC:  Recent Labs Lab 10/28/16 0745  10/30/16 1649 10/31/16 1205 11/01/16 0451 11/02/16 0455 11/03/16 0506  WBC 16.9*  < > 13.8* 11.6* 9.7 10.8* 16.7*  NEUTROABS 14.1*  --   --  10.2*  --   --   --   HGB 11.9*  < > 12.2 12.3 10.8* 10.9* 11.3*  HCT 36.6  < > 38.1 36.7 32.7* 32.2* 33.1*  MCV 83.2  < > 82.6 79.6 78.6 77.8* 76.4*  PLT 256  < > 258 274 238 221 208  < > = values in this interval not displayed. Cardiac Enzymes: No results for input(s): CKTOTAL, CKMB, CKMBINDEX, TROPONINI in the last 168 hours. CBG (last 3)  No results for input(s): GLUCAP in the last 72 hours. Recent Results (from the past 240 hour(s))  Surgical PCR screen     Status: Abnormal   Collection Time: 10/30/16 10:59 AM  Result Value Ref Range Status   MRSA, PCR POSITIVE (A) NEGATIVE Final    Comment: RESULT CALLED TO, READ BACK BY AND VERIFIED WITH: K BRACEY AT 1626 ON 01.22.2018 BY NBROOKS    Staphylococcus aureus POSITIVE (A) NEGATIVE Final    Comment:        The Xpert SA Assay (FDA approved for NASAL specimens in patients over 30 years of age), is one component of a comprehensive surveillance program.  Test performance has been validated by [facility] for patients greater than or equal to 56 year old. It is not intended to diagnose infection nor to guide or monitor treatment.      Studies: Ct Abdomen Pelvis W Contrast  Result Date: 11/02/2016 CLINICAL DATA:  Postoperative abdominal pain, status post cholecystectomy on 10/30/2016. EXAM: CT ABDOMEN AND PELVIS  WITH CONTRAST TECHNIQUE: Multidetector CT imaging of the abdomen and pelvis was performed using the standard protocol following bolus administration of intravenous contrast. CONTRAST:  1106mL ISOVUE-300 IOPAMIDOL (ISOVUE-300) INJECTION 61% COMPARISON:  10/28/2016. FINDINGS: Lower chest: Minimal right basilar atelectasis. Hepatobiliary: Cholecystectomy clips. Small amount of postoperative fluid in the gallbladder bed without abnormal enhancement. Unremarkable liver. Pancreas: Mild diffuse atrophy. Spleen: Unremarkable. Adrenals/Urinary Tract: Adrenal glands are unremarkable. Small left renal cyst. Otherwise, the kidneys are normal, without renal calculi, focal lesion, or hydronephrosis. Bladder is unremarkable. Stomach/Bowel: Multiple dilated small bowel loops. Normal caliber stomach and colon. No evidence of appendicitis. Vascular/Lymphatic: Dense atheromatous arterial calcifications, including the abdominal aorta. No enlarged lymph  nodes. Reproductive: The Retroverted and mildly heterogeneous uterus containing a small amount of calcification. No adnexal masses. Other: Diffuse subcutaneous edema.  Minimal ascites. Musculoskeletal: Laminectomy defects and pedicle screw and rod fixation at the L2 through L5 levels with normal alignment. Lower thoracic spine degenerative changes. IMPRESSION: 1. Dilated small bowel, compatible postoperative ileus. 2. Minimal ascites and small amount of postoperative fluid in the gallbladder bed, within normal limits of normal fluid following surgery. 3. Fibroid uterus. 4. Aortic atherosclerosis. Electronically Signed   By: Claudie Revering M.D.   On: 11/02/2016 17:42   Dg Chest Port 1 View  Result Date: 11/03/2016 CLINICAL DATA:  Mid chest pain, COPD, hypertension, former smoker, leukocytosis EXAM: PORTABLE CHEST 1 VIEW COMPARISON:  10/28/2016 FINDINGS: Normal heart size, mediastinal contours, and pulmonary vascularity. Atherosclerotic calcification aorta. Lungs clear. No pleural  effusion or pneumothorax. Osseous mineralization normal. IMPRESSION: No acute abnormalities. Aortic atherosclerosis. Electronically Signed   By: Lavonia Dana M.D.   On: 11/03/2016 09:30   Scheduled Meds: . amLODipine  5 mg Oral Daily  . Chlorhexidine Gluconate Cloth  6 each Topical Q0600  . feeding supplement (ENSURE ENLIVE)  237 mL Oral BID BM  . heparin subcutaneous  5,000 Units Subcutaneous Q8H  . mupirocin ointment  1 application Nasal BID  . nebivolol  5 mg Oral Daily  . pantoprazole  40 mg Oral QHS   Continuous Infusions: . dextrose 5 % and 0.45 % NaCl with KCl 20 mEq/L 75 mL/hr at 11/03/16 1604    Principal Problem:   S/P laparoscopic cholecystectomy for necrotic gallbaldder Jan 2018  [facility], MD, FACP, [facility]. [facility] Pager [phone]  If 7PM-7AM, please contact night-coverage [email or web address] Password [email or web address] 11/03/2016, 4:22 PM   LOS: 6 days

## 2016-11-03 NOTE — Progress Notes (Signed)
Physical Therapy Treatment Patient Details Name: Melanie [name] MRN: [id number] DOB: [birth year] Today's Date: 11/03/2016    History of Present Illness 81 y.o. female with medical history significant of gallstones presents to the hospital with the chief complaint of abdominal pain. s/p laparoscopic cholecystectomy 10/30/16.    PT Comments    POD # 4 LAP Necrotic Gallbladder with post Op ileus and MAX ABD cramping this session Assisted OOB to amb a limited distance to bathroom.  MAX c/o ABD cramping.  Pt grimacing, moaning in obvious discomfort.  Allowed increased time to sit on toilet to encourage a BM.  Nearly 25 min on commode and exhausted.  Assisted with hygiene then amb to recliner.  Positioned to comfort and exited room and notified NT pt had a BM/void.  Notified RN pt having "alot" ABD cramping.  Then NT stopped me in the hallway and reported pt was throwing up.      Follow Up Recommendations  Home health PT;Other (comment)     Equipment Recommendations  Rolling walker with 5" wheels;Other (comment) (shower seat)    Recommendations for Other Services       Precautions / Restrictions Precautions Precautions: Fall Restrictions Weight Bearing Restrictions: No    Mobility  Bed Mobility Overal bed mobility: Needs Assistance Bed Mobility: Supine to Sit     Supine to sit: Min assist;Mod assist     General bed mobility comments: increased time and use of bed pad to complete scooting to EOB due to current amount of ABD pain  Transfers Overall transfer level: Needs assistance Equipment used: Rolling walker (2 wheeled) Transfers: Sit to/from Stand Sit to Stand: Min guard;Supervision         General transfer comment: increased time plus assisted on/off toilet with 25% VC's on proper hand placement and turn completion prior to sit  Ambulation/Gait Ambulation/Gait assistance: Min guard Ambulation Distance (Feet): 22 Feet (11 feet x 2 to and from bathroom only due to current  pain level) Assistive device: Rolling walker (2 wheeled) Gait Pattern/deviations: Step-through pattern;Decreased step length - right;Decreased step length - left;Trunk flexed     General Gait Details: VCs for posture, min/guard for safety, increased time   Stairs            Wheelchair Mobility    Modified Rankin (Stroke Patients Only)       Balance                                    Cognition Arousal/Alertness: Awake/alert Behavior During Therapy: WFL for tasks assessed/performed Overall Cognitive Status: Within Functional Limits for tasks assessed                      Exercises      General Comments        Pertinent Vitals/Pain Pain Assessment: Faces Faces Pain Scale: Hurts whole lot Pain Location: ABD cramping off and on in waves Pain Descriptors / Indicators: Crying;Grimacing Pain Intervention(s): Monitored during session    Home Living                      Prior Function            PT Goals (current goals can now be found in the care plan section) Progress towards PT goals: Progressing toward goals    Frequency    Min 3X/week      PT Plan  Current plan remains appropriate    Co-evaluation             End of Session Equipment Utilized During Treatment: Gait belt Activity Tolerance: Patient limited by pain Patient left: in chair;with call bell/phone within reach;with family/visitor present     Time: 1005-1100 PT Time Calculation (min) (ACUTE ONLY): 55 min  Charges:  $Gait Training: 23-37 mins $Therapeutic Activity: 23-37 mins                    G Codes:      Rica Koyanagi  PTA [facility]  Acute  Rehab Pager      [phone]

## 2016-11-04 ENCOUNTER — Inpatient Hospital Stay (HOSPITAL_COMMUNITY): Payer: Medicare Other

## 2016-11-04 DIAGNOSIS — D72829 Elevated white blood cell count, unspecified: Secondary | ICD-10-CM

## 2016-11-04 LAB — BASIC METABOLIC PANEL
ANION GAP: 6 (ref 5–15)
BUN: 18 mg/dL (ref 6–20)
CHLORIDE: 110 mmol/L (ref 101–111)
CO2: 22 mmol/L (ref 22–32)
CREATININE: 0.63 mg/dL (ref 0.44–1.00)
Calcium: 8.4 mg/dL — ABNORMAL LOW (ref 8.9–10.3)
GFR calc non Af Amer: >60 mL/min (ref >60)
Glucose, Bld: 119 mg/dL — ABNORMAL HIGH (ref 65–99)
Potassium: 4.1 mmol/L (ref 3.5–5.1)
Sodium: 138 mmol/L (ref 135–145)

## 2016-11-04 LAB — CBC
HEMATOCRIT: 30.6 % — AB (ref 36.0–46.0)
HEMOGLOBIN: 10.5 g/dL — AB (ref 12.0–15.0)
MCH: 26.3 pg (ref 26.0–34.0)
MCHC: 34.3 g/dL (ref 30.0–36.0)
MCV: 76.5 fL — AB (ref 78.0–100.0)
Platelets: 210 10*3/uL (ref 150–400)
RBC: 4 MIL/uL (ref 3.87–5.11)
RDW: 14.3 % (ref 11.5–15.5)
WBC: 29.3 10*3/uL — ABNORMAL HIGH (ref 4.0–10.5)

## 2016-11-04 LAB — C DIFFICILE QUICK SCREEN W PCR REFLEX
C DIFFICILE (CDIFF) INTERP: NOT DETECTED
C Diff antigen: NEGATIVE
C Diff toxin: NEGATIVE

## 2016-11-04 MED ORDER — AMLODIPINE BESYLATE 10 MG PO TABS
10.0000 mg | ORAL_TABLET | Freq: Every day | ORAL | Status: DC
  Administered 2016-11-05 – 2016-11-16 (×11): 10 mg via ORAL
  Filled 2016-11-04 (×12): qty 1

## 2016-11-04 MED ORDER — TECHNETIUM TC 99M MEBROFENIN IV KIT
5.1000 | PACK | Freq: Once | INTRAVENOUS | Status: AC | PRN
  Administered 2016-11-04: 5.1 via INTRAVENOUS

## 2016-11-04 MED ORDER — PIPERACILLIN-TAZOBACTAM 3.375 G IVPB
3.3750 g | Freq: Three times a day (TID) | INTRAVENOUS | Status: DC
  Administered 2016-11-04 – 2016-11-17 (×39): 3.375 g via INTRAVENOUS
  Filled 2016-11-04 (×40): qty 50

## 2016-11-04 NOTE — Progress Notes (Signed)
Zofran IV given @2020  for nausea.  Pt c/o severe abd pain and pt doubles over when OOB.  Faint bowel sounds noted.  Small loose stool mixed w/ urine in BSC. Strongly and repeatedly encouraged pt to ambulate and  offered asst  each time.  Pt refused to ambulate but pt sat in rocking chair x 2 hrs.

## 2016-11-04 NOTE — Progress Notes (Signed)
Transported to [place] via bed accompanied by NT and RN for HIDA scan.

## 2016-11-04 NOTE — Progress Notes (Signed)
Assessment Principal Problem:   S/P laparoscopic cholecystectomy for necrotic gallbaldder October 30, 2016-still have some abdominal pains; WBC up to 29k; CT no abscess.   Plan:  HIDA scan to look for bile leak. Restart Zosyn.   LOS: 7 days     5 Days Post-Op  Subjective: Still having some abdominal pains.   Objective: Vital signs in last 24 hours: Temp:  [97.9 F (36.6 C)-98 F (36.7 C)] 98 F (36.7 C) (01/27 0637) Pulse Rate:  [87-93] 91 (01/27 0637) Resp:  [18] 18 (01/27 0637) BP: (143-189)/(59-78) 189/74 (01/27 0637) SpO2:  [98 %] 98 % (01/27 0637) Last BM Date: 11/03/16  Intake/Output from previous day: 01/26 0701 - 01/27 0700 In: 2360 [P.O.:400; I.V.:1750; IV Piggyback:210] Out: 300 [Urine:300] Intake/Output this shift: No intake/output data recorded.  PE: General- In NAD Abdomen-soft, mild diffuse tenderness, wounds clean.  Lab Results:   Recent Labs  11/03/16 0506 11/04/16 0522  WBC 16.7* 29.3*  HGB 11.3* 10.5*  HCT 33.1* 30.6*  PLT 208 210   BMET  Recent Labs  11/03/16 0506 11/04/16 0522  NA 139 138  K 3.4* 4.1  CL 107 110  CO2 23 22  GLUCOSE 119* 119*  BUN 21* 18  CREATININE 0.53 0.63  CALCIUM 8.6* 8.4*   PT/INR No results for input(s): LABPROT, INR in the last 72 hours. Comprehensive Metabolic Panel:    Component Value Date/Time   NA 138 11/04/2016 0522   NA 139 11/03/2016 0506   K 4.1 11/04/2016 0522   K 3.4 (L) 11/03/2016 0506   CL 110 11/04/2016 0522   CL 107 11/03/2016 0506   CO2 22 11/04/2016 0522   CO2 23 11/03/2016 0506   BUN 18 11/04/2016 0522   BUN 21 (H) 11/03/2016 0506   CREATININE 0.63 11/04/2016 0522   CREATININE 0.53 11/03/2016 0506   GLUCOSE 119 (H) 11/04/2016 0522   GLUCOSE 119 (H) 11/03/2016 0506   CALCIUM 8.4 (L) 11/04/2016 0522   CALCIUM 8.6 (L) 11/03/2016 0506   AST 30 11/03/2016 0506   AST 51 (H) 11/02/2016 0455   ALT 50 11/03/2016 0506   ALT 68 (H) 11/02/2016 0455   ALKPHOS 171 (H) 11/03/2016 0506    ALKPHOS 161 (H) 11/02/2016 0455   BILITOT 1.2 11/03/2016 0506   BILITOT 2.1 (H) 11/02/2016 0455   PROT 6.0 (L) 11/03/2016 0506   PROT 5.7 (L) 11/02/2016 0455   ALBUMIN 2.3 (L) 11/03/2016 0506   ALBUMIN 2.2 (L) 11/02/2016 0455     Studies/Results: Ct Abdomen Pelvis W Contrast  Result Date: 11/02/2016 CLINICAL DATA:  Postoperative abdominal pain, status post cholecystectomy on 10/30/2016. EXAM: CT ABDOMEN AND PELVIS WITH CONTRAST TECHNIQUE: Multidetector CT imaging of the abdomen and pelvis was performed using the standard protocol following bolus administration of intravenous contrast. CONTRAST:  177mL ISOVUE-300 IOPAMIDOL (ISOVUE-300) INJECTION 61% COMPARISON:  10/28/2016. FINDINGS: Lower chest: Minimal right basilar atelectasis. Hepatobiliary: Cholecystectomy clips. Small amount of postoperative fluid in the gallbladder bed without abnormal enhancement. Unremarkable liver. Pancreas: Mild diffuse atrophy. Spleen: Unremarkable. Adrenals/Urinary Tract: Adrenal glands are unremarkable. Small left renal cyst. Otherwise, the kidneys are normal, without renal calculi, focal lesion, or hydronephrosis. Bladder is unremarkable. Stomach/Bowel: Multiple dilated small bowel loops. Normal caliber stomach and colon. No evidence of appendicitis. Vascular/Lymphatic: Dense atheromatous arterial calcifications, including the abdominal aorta. No enlarged lymph nodes. Reproductive: The Retroverted and mildly heterogeneous uterus containing a small amount of calcification. No adnexal masses. Other: Diffuse subcutaneous edema.  Minimal ascites. Musculoskeletal: Laminectomy defects and  pedicle screw and rod fixation at the L2 through L5 levels with normal alignment. Lower thoracic spine degenerative changes. IMPRESSION: 1. Dilated small bowel, compatible postoperative ileus. 2. Minimal ascites and small amount of postoperative fluid in the gallbladder bed, within normal limits of normal fluid following surgery. 3. Fibroid  uterus. 4. Aortic atherosclerosis. Electronically Signed   By: Claudie Revering M.D.   On: 11/02/2016 17:42   Dg Chest Port 1 View  Result Date: 11/03/2016 CLINICAL DATA:  Mid chest pain, COPD, hypertension, former smoker, leukocytosis EXAM: PORTABLE CHEST 1 VIEW COMPARISON:  10/28/2016 FINDINGS: Normal heart size, mediastinal contours, and pulmonary vascularity. Atherosclerotic calcification aorta. Lungs clear. No pleural effusion or pneumothorax. Osseous mineralization normal. IMPRESSION: No acute abnormalities. Aortic atherosclerosis. Electronically Signed   By: Lavonia Dana M.D.   On: 11/03/2016 09:30    Anti-infectives: Anti-infectives    Start     Dose/Rate Route Frequency Ordered Stop   10/30/16 1600  piperacillin-tazobactam (ZOSYN) IVPB 3.375 g  Status:  Discontinued     3.375 g 12.5 mL/hr over 240 Minutes Intravenous Every 8 hours 10/30/16 1539 11/01/16 1814   10/29/16 1200  piperacillin-tazobactam (ZOSYN) IVPB 3.375 g  Status:  Discontinued     3.375 g 12.5 mL/hr over 240 Minutes Intravenous Every 8 hours 10/29/16 0941 10/30/16 1539       Taner Rzepka J 11/04/2016

## 2016-11-04 NOTE — Progress Notes (Signed)
PROGRESS NOTE   Melanie [name]  [id number]  DOB: July 07, 1936  DOA: 10/28/2016 PCP: Osborne Casco, MD  Hospital course:  Melanie [name] is a 81 y.o. female with medical history significant of gallstones presents to the hospital with the chief complaint of abdominal pain.  she had been experiencing abdominal pain for 4 weeks prior to this admission, had been diagnosed with gallstones and was scheduled to see general surgery this week. However due to worsening of her symptoms, she presented to the ED on 10/28/16. General surgery was consulted. She was assessed to have symptomatic cholelithiasis and choledocholithiasis. MRCP was negative for obstruction. She underwent laparoscopic cholecystectomy with IOC on 10/30/16 which showed necrotic gallbladder with free intraperitoneal bile, enlarged CBD with free flow into the duodenum. Slow postop progress. General surgery continues to follow.  Assessment & Plan:   1. Acute cholecystitis and Biliary colic -  s/p lap chole and negative IOC on 1/22.  Very slow and complicated postop progress. Ongoing issues with abdominal pain, diarrhea, poor oral intake and worsening leukocytosis. Infectious workup was initiated 1/26: Chest x-ray negative, CT abdomen and pelvis with contrast 11/02/16 without acute findings, urine microscopy not suggestive of UTI, C. difficile testing negative. Zosyn that had been stopped on 1/24 was reinitiated by surgery on 1/27. HIDA scan ruled out biliary leak. Transiently elevated LFTs have improved.   2. Hypertension. Exacerbated by pain, Hydralazine IV ordered as needed. Started Amlodipine and Bystolic per home dose. Hold home clonidine and HCTZ for now. Uncontrolled. Increase amlodipine to 10 MG daily.  3. COPD. Stable with no exacerbation.  4. GERD. Continue omeprazole.  5.  Hypokalemia - replace and follow as needed  6. AKI - resolved after IV fluid hydration.  7. Anemia: Stable.  8: Leukocytosis: As per problem #1.    DVT prophylaxis: heparin Code Status: Full  Family Communication:  discussed in detail with patient's son and daughter-in-law at bedside. Disposition Plan: Home when medically stable.   Subjective: Ongoing abdominal pain, watery diarrhea and decreased appetite.  Objective: Vitals:   11/03/16 2150 11/04/16 0637 11/04/16 0940 11/04/16 1358  BP: (!) 143/78 (!) 189/74 (!) 179/60 (!) 156/66  Pulse: 93 91  78  Resp: 18 18  18   Temp: 97.9 F (36.6 C) 98 F (36.7 C)  98.7 F (37.1 C)  TempSrc: Oral Oral  Oral  SpO2: 98% 98%  100%  Weight:      Height:        Intake/Output Summary (Last 24 hours) at 11/04/16 1505 Last data filed at 11/04/16 0655  Gross per 24 hour  Intake             1405 ml  Output              300 ml  Net             1105 ml   Filed Weights   10/28/16 0551  Weight: 70.3 kg (155 lb)   Exam:  General appearance: alert, cooperative and appears chronically ill looking but not uncomfortable or in distress. Resp: Clear to auscultation.No increased work of breathing. GI: soft, tender to palpation, mildly distended, wounds clean and dry. No rigidity, guarding or rebound. Normal bowel sounds heard. Abdominal distention seems better today. Central nervous system: Alert and oriented. No focal neurological deficits. Extremities: no CCE.  Data Reviewed: Basic Metabolic Panel:  Recent Labs Lab 10/31/16 1205 11/01/16 0451 11/02/16 0455 11/03/16 0506 11/04/16 0522  NA 140 140 139 139 138  K 3.9 3.4* 3.7 3.4* 4.1  CL 109 109 109 107 110  CO2 21* 21* 22 23 22   GLUCOSE 144* 136* 105* 119* 119*  BUN 32* 35* 30* 21* 18  CREATININE 1.16* 0.99 0.82 0.53 0.63  CALCIUM 8.9 8.5* 8.5* 8.6* 8.4*   Liver Function Tests:  Recent Labs Lab 10/29/16 0457 10/31/16 1205 11/01/16 0451 11/02/16 0455 11/03/16 0506  AST 16 141* 108* 51* 30  ALT 12* 97* 90* 68* 50  ALKPHOS 65 148* 175* 161* 171*  BILITOT 1.1 1.8* 2.0* 2.1* 1.2  PROT 6.7 7.0 5.8* 5.7* 6.0*  ALBUMIN  3.2* 2.8* 2.3* 2.2* 2.3*    Recent Labs Lab 11/02/16 0455  LIPASE 13   No results for input(s): AMMONIA in the last 168 hours. CBC:  Recent Labs Lab 10/31/16 1205 11/01/16 0451 11/02/16 0455 11/03/16 0506 11/04/16 0522  WBC 11.6* 9.7 10.8* 16.7* 29.3*  NEUTROABS 10.2*  --   --   --   --   HGB 12.3 10.8* 10.9* 11.3* 10.5*  HCT 36.7 32.7* 32.2* 33.1* 30.6*  MCV 79.6 78.6 77.8* 76.4* 76.5*  PLT 274 238 221 208 210   Cardiac Enzymes: No results for input(s): CKTOTAL, CKMB, CKMBINDEX, TROPONINI in the last 168 hours. CBG (last 3)  No results for input(s): GLUCAP in the last 72 hours. Recent Results (from the past 240 hour(s))  Surgical PCR screen     Status: Abnormal   Collection Time: 10/30/16 10:59 AM  Result Value Ref Range Status   MRSA, PCR POSITIVE (A) NEGATIVE Final    Comment: RESULT CALLED TO, READ BACK BY AND VERIFIED WITH: K BRACEY AT 1626 ON 01.22.2018 BY NBROOKS    Staphylococcus aureus POSITIVE (A) NEGATIVE Final    Comment:        The Xpert SA Assay (FDA approved for NASAL specimens in patients over 40 years of age), is one component of a comprehensive surveillance program.  Test performance has been validated by [facility] for patients greater than or equal to 52 year old. It is not intended to diagnose infection nor to guide or monitor treatment.   C difficile quick scan w PCR reflex     Status: None   Collection Time: 11/04/16  6:41 AM  Result Value Ref Range Status   C Diff antigen NEGATIVE NEGATIVE Final   C Diff toxin NEGATIVE NEGATIVE Final   C Diff interpretation No C. difficile detected.  Final     Studies: Nm Hepatobiliary Liver Func  Result Date: 11/04/2016 CLINICAL DATA:  81 year old female with continued abdominal pain following cholecystectomy on 10/30/2016. Evaluate for bile leak. EXAM: NUCLEAR MEDICINE HEPATOBILIARY IMAGING TECHNIQUE: Sequential images of the abdomen were obtained out to 60 minutes following intravenous  administration of radiopharmaceutical. RADIOPHARMACEUTICALS:  5.1 mCi Tc-53m  Choletec IV COMPARISON:  None. FINDINGS: Prompt uptake and biliary excretion of activity by the liver is seen. Small bowel activity is identified at 20 minutes. No unexpected activity identified to suggest a bile leak. IMPRESSION: No evidence of bile leak. Prompt hepatobiliary uptake and excretion into the CBD and bowel. No evidence of biliary obstruction. Electronically Signed   By: Margarette Canada M.D.   On: 11/04/2016 12:46   Ct Abdomen Pelvis W Contrast  Result Date: 11/02/2016 CLINICAL DATA:  Postoperative abdominal pain, status post cholecystectomy on 10/30/2016. EXAM: CT ABDOMEN AND PELVIS WITH CONTRAST TECHNIQUE: Multidetector CT imaging of the abdomen and pelvis was performed using the standard protocol following bolus administration of intravenous contrast. CONTRAST:  166mL ISOVUE-300 IOPAMIDOL (ISOVUE-300) INJECTION 61% COMPARISON:  10/28/2016. FINDINGS: Lower chest: Minimal right basilar atelectasis. Hepatobiliary: Cholecystectomy clips. Small amount of postoperative fluid in the gallbladder bed without abnormal enhancement. Unremarkable liver. Pancreas: Mild diffuse atrophy. Spleen: Unremarkable. Adrenals/Urinary Tract: Adrenal glands are unremarkable. Small left renal cyst. Otherwise, the kidneys are normal, without renal calculi, focal lesion, or hydronephrosis. Bladder is unremarkable. Stomach/Bowel: Multiple dilated small bowel loops. Normal caliber stomach and colon. No evidence of appendicitis. Vascular/Lymphatic: Dense atheromatous arterial calcifications, including the abdominal aorta. No enlarged lymph nodes. Reproductive: The Retroverted and mildly heterogeneous uterus containing a small amount of calcification. No adnexal masses. Other: Diffuse subcutaneous edema.  Minimal ascites. Musculoskeletal: Laminectomy defects and pedicle screw and rod fixation at the L2 through L5 levels with normal alignment. Lower  thoracic spine degenerative changes. IMPRESSION: 1. Dilated small bowel, compatible postoperative ileus. 2. Minimal ascites and small amount of postoperative fluid in the gallbladder bed, within normal limits of normal fluid following surgery. 3. Fibroid uterus. 4. Aortic atherosclerosis. Electronically Signed   By: Claudie Revering M.D.   On: 11/02/2016 17:42   Dg Chest Port 1 View  Result Date: 11/03/2016 CLINICAL DATA:  Mid chest pain, COPD, hypertension, former smoker, leukocytosis EXAM: PORTABLE CHEST 1 VIEW COMPARISON:  10/28/2016 FINDINGS: Normal heart size, mediastinal contours, and pulmonary vascularity. Atherosclerotic calcification aorta. Lungs clear. No pleural effusion or pneumothorax. Osseous mineralization normal. IMPRESSION: No acute abnormalities. Aortic atherosclerosis. Electronically Signed   By: Lavonia Dana M.D.   On: 11/03/2016 09:30   Scheduled Meds: . amLODipine  5 mg Oral Daily  . Chlorhexidine Gluconate Cloth  6 each Topical Q0600  . feeding supplement (ENSURE ENLIVE)  237 mL Oral BID BM  . heparin subcutaneous  5,000 Units Subcutaneous Q8H  . mupirocin ointment  1 application Nasal BID  . nebivolol  5 mg Oral Daily  . pantoprazole  40 mg Oral QHS  . piperacillin-tazobactam (ZOSYN)  IV  3.375 g Intravenous Q8H   Continuous Infusions: . dextrose 5 % and 0.45 % NaCl with KCl 20 mEq/L 75 mL/hr at 11/04/16 [id number]    Principal Problem:   S/P laparoscopic cholecystectomy for necrotic gallbaldder Jan 2018  [facility], MD, FACP, [facility]. [facility] Pager [phone]  If 7PM-7AM, please contact night-coverage [email or web address] Password [email or web address] 11/04/2016, 3:05 PM   LOS: 7 days

## 2016-11-05 LAB — CBC
HCT: 29 % — ABNORMAL LOW (ref 36.0–46.0)
HEMOGLOBIN: 10.1 g/dL — AB (ref 12.0–15.0)
MCH: 26.6 pg (ref 26.0–34.0)
MCHC: 34.8 g/dL (ref 30.0–36.0)
MCV: 76.3 fL — ABNORMAL LOW (ref 78.0–100.0)
PLATELETS: 193 10*3/uL (ref 150–400)
RBC: 3.8 MIL/uL — AB (ref 3.87–5.11)
RDW: 14.3 % (ref 11.5–15.5)
WBC: 23.9 10*3/uL — AB (ref 4.0–10.5)

## 2016-11-05 LAB — COMPREHENSIVE METABOLIC PANEL
ALBUMIN: 2.1 g/dL — AB (ref 3.5–5.0)
ALT: 31 U/L (ref 14–54)
AST: 23 U/L (ref 15–41)
Alkaline Phosphatase: 164 U/L — ABNORMAL HIGH (ref 38–126)
Anion gap: 7 (ref 5–15)
BUN: 16 mg/dL (ref 6–20)
CHLORIDE: 108 mmol/L (ref 101–111)
CO2: 21 mmol/L — AB (ref 22–32)
Calcium: 8.1 mg/dL — ABNORMAL LOW (ref 8.9–10.3)
Creatinine, Ser: 0.63 mg/dL (ref 0.44–1.00)
GFR calc Af Amer: >60 mL/min (ref >60)
Glucose, Bld: 105 mg/dL — ABNORMAL HIGH (ref 65–99)
POTASSIUM: 3.6 mmol/L (ref 3.5–5.1)
SODIUM: 136 mmol/L (ref 135–145)
Total Bilirubin: 0.7 mg/dL (ref 0.3–1.2)
Total Protein: 5.2 g/dL — ABNORMAL LOW (ref 6.5–8.1)

## 2016-11-05 LAB — LIPASE, BLOOD: LIPASE: 26 U/L (ref 11–51)

## 2016-11-05 NOTE — Progress Notes (Signed)
PROGRESS NOTE   Melanie [name]  [id number]  DOB: [birth year]  DOA: 10/28/2016 PCP: Melanie Casco, MD  Hospital course:  Melanie [name] is a 81 y.o. female with medical history significant of gallstones presents to the hospital with the chief complaint of abdominal pain.  she had been experiencing abdominal pain for 4 weeks prior to this admission, had been diagnosed with gallstones and was scheduled to see general surgery this week. However due to worsening of her symptoms, she presented to the ED on 10/28/16. General surgery was consulted. She was assessed to have symptomatic cholelithiasis and choledocholithiasis. MRCP was negative for obstruction. She underwent laparoscopic cholecystectomy with IOC on 10/30/16 which showed necrotic gallbladder with free intraperitoneal bile, enlarged CBD with free flow into the duodenum. Slow postop progress. General surgery continues to follow.  Assessment & Plan:   1. Acute cholecystitis and Biliary colic -  s/p lap chole and negative IOC on 1/22.  Very slow and complicated postop progress. Ongoing issues with abdominal pain, diarrhea, poor oral intake and worsening leukocytosis. Infectious workup was initiated 1/26: Chest x-ray negative, CT abdomen and pelvis with contrast 11/02/16 without acute findings, urine microscopy not suggestive of UTI, C. difficile testing negative. Zosyn that had been stopped on 1/24 was reinitiated by surgery on 1/27. HIDA scan ruled out biliary leak. Transiently elevated LFTs have improved. Still with some abdominal pain. Diarrhea may be decreasing. No fevers. Leukocytosis slightly better. Normal lipase. Continue Zosyn and current diet. Surgery following.  2. Hypertension. Exacerbated by pain, Hydralazine IV ordered as needed. Started Amlodipine and Bystolic per home dose. Hold home clonidine and HCTZ for now. Uncontrolled. Increase amlodipine to 10 MG daily. Monitor.  3. COPD. Stable with no exacerbation.  4. GERD.  Continue omeprazole.  5.  Hypokalemia - replace and follow as needed  6. AKI - resolved after IV fluid hydration.  7. Anemia: Stable.  8: Leukocytosis: As per problem #1. Improving   DVT prophylaxis: heparin Code Status: Full  Family Communication:  discussed in detail with patient's son and daughter-in-law at bedside. Disposition Plan: Home when medically stable.   Subjective: Still with some abdominal pain after eating. Diarrhea decreasing-states that she had 1 episode last night and one this morning.  Objective: Vitals:   11/04/16 0940 11/04/16 1358 11/04/16 1750 11/05/16 0500  BP: (!) 179/60 (!) 156/66 (!) 161/57 (!) 165/73  Pulse:  78 76 81  Resp:  18 18 18   Temp:  98.7 F (37.1 C) 98.2 F (36.8 C) 98 F (36.7 C)  TempSrc:  Oral Oral Oral  SpO2:  100% 100% 99%  Weight:      Height:        Intake/Output Summary (Last 24 hours) at 11/05/16 1355 Last data filed at 11/05/16 1000  Gross per 24 hour  Intake             1900 ml  Output              300 ml  Net             1600 ml   Filed Weights   10/28/16 0551  Weight: 70.3 kg (155 lb)   Exam:  General appearance: alert, cooperative and appears chronically ill looking but not uncomfortable or in distress. Resp: Clear to auscultation.No increased work of breathing. GI: soft, tender to palpation, mildly distended, wounds clean and dry. No rigidity, guarding or rebound. Normal bowel sounds heard. Abdominal distention seems better today. Central nervous system: Alert and  oriented. No focal neurological deficits. Extremities: no CCE.  Data Reviewed: Basic Metabolic Panel:  Recent Labs Lab 11/01/16 0451 11/02/16 0455 11/03/16 0506 11/04/16 0522 11/05/16 0512  NA 140 139 139 138 136  K 3.4* 3.7 3.4* 4.1 3.6  CL 109 109 107 110 108  CO2 21* 22 23 22  21*  GLUCOSE 136* 105* 119* 119* 105*  BUN 35* 30* 21* 18 16  CREATININE 0.99 0.82 0.53 0.63 0.63  CALCIUM 8.5* 8.5* 8.6* 8.4* 8.1*   Liver Function  Tests:  Recent Labs Lab 10/31/16 1205 11/01/16 0451 11/02/16 0455 11/03/16 0506 11/05/16 0512  AST 141* 108* 51* 30 23  ALT 97* 90* 68* 50 31  ALKPHOS 148* 175* 161* 171* 164*  BILITOT 1.8* 2.0* 2.1* 1.2 0.7  PROT 7.0 5.8* 5.7* 6.0* 5.2*  ALBUMIN 2.8* 2.3* 2.2* 2.3* 2.1*    Recent Labs Lab 11/02/16 0455 11/05/16 0512  LIPASE 13 26   No results for input(s): AMMONIA in the last 168 hours. CBC:  Recent Labs Lab 10/31/16 1205 11/01/16 0451 11/02/16 0455 11/03/16 0506 11/04/16 0522 11/05/16 0514  WBC 11.6* 9.7 10.8* 16.7* 29.3* 23.9*  NEUTROABS 10.2*  --   --   --   --   --   HGB 12.3 10.8* 10.9* 11.3* 10.5* 10.1*  HCT 36.7 32.7* 32.2* 33.1* 30.6* 29.0*  MCV 79.6 78.6 77.8* 76.4* 76.5* 76.3*  PLT 274 238 221 208 210 193   Cardiac Enzymes: No results for input(s): CKTOTAL, CKMB, CKMBINDEX, TROPONINI in the last 168 hours. CBG (last 3)  No results for input(s): GLUCAP in the last 72 hours. Recent Results (from the past 240 hour(s))  Surgical PCR screen     Status: Abnormal   Collection Time: 10/30/16 10:59 AM  Result Value Ref Range Status   MRSA, PCR POSITIVE (A) NEGATIVE Final    Comment: RESULT CALLED TO, READ BACK BY AND VERIFIED WITH: K BRACEY AT 1626 ON 01.22.2018 BY NBROOKS    Staphylococcus aureus POSITIVE (A) NEGATIVE Final    Comment:        The Xpert SA Assay (FDA approved for NASAL specimens in patients over 26 years of age), is one component of a comprehensive surveillance program.  Test performance has been validated by [facility] for patients greater than or equal to 44 year old. It is not intended to diagnose infection nor to guide or monitor treatment.   C difficile quick scan w PCR reflex     Status: None   Collection Time: 11/04/16  6:41 AM  Result Value Ref Range Status   C Diff antigen NEGATIVE NEGATIVE Final   C Diff toxin NEGATIVE NEGATIVE Final   C Diff interpretation No C. difficile detected.  Final     Studies: Nm  Hepatobiliary Liver Func  Result Date: 11/04/2016 CLINICAL DATA:  81 year old female with continued abdominal pain following cholecystectomy on 10/30/2016. Evaluate for bile leak. EXAM: NUCLEAR MEDICINE HEPATOBILIARY IMAGING TECHNIQUE: Sequential images of the abdomen were obtained out to 60 minutes following intravenous administration of radiopharmaceutical. RADIOPHARMACEUTICALS:  5.1 mCi Tc-9m  Choletec IV COMPARISON:  None. FINDINGS: Prompt uptake and biliary excretion of activity by the liver is seen. Small bowel activity is identified at 20 minutes. No unexpected activity identified to suggest a bile leak. IMPRESSION: No evidence of bile leak. Prompt hepatobiliary uptake and excretion into the CBD and bowel. No evidence of biliary obstruction. Electronically Signed   By: Margarette Canada M.D.   On: 11/04/2016 12:46  Scheduled Meds: . amLODipine  10 mg Oral Daily  . feeding supplement (ENSURE ENLIVE)  237 mL Oral BID BM  . heparin subcutaneous  5,000 Units Subcutaneous Q8H  . nebivolol  5 mg Oral Daily  . pantoprazole  40 mg Oral QHS  . piperacillin-tazobactam (ZOSYN)  IV  3.375 g Intravenous Q8H   Continuous Infusions: . dextrose 5 % and 0.45 % NaCl with KCl 20 mEq/L 75 mL/hr at 11/05/16 1115    Principal Problem:   S/P laparoscopic cholecystectomy for necrotic gallbaldder Jan 2018  [facility], MD, FACP, [facility]. [facility] Pager [phone]  If 7PM-7AM, please contact night-coverage [email or web address] Password [email or web address] 11/05/2016, 1:55 PM   LOS: 8 days

## 2016-11-05 NOTE — Progress Notes (Signed)
6 Days Post-Op  Subjective: Still with abdominal pain whenever she tries to eat No nausea or vomiting HIDA negative for leak  Objective: Vital signs in last 24 hours: Temp:  [98 F (36.7 C)-98.7 F (37.1 C)] 98 F (36.7 C) (01/28 0500) Pulse Rate:  [76-81] 81 (01/28 0500) Resp:  [18] 18 (01/28 0500) BP: (156-179)/(57-73) 165/73 (01/28 0500) SpO2:  [99 %-100 %] 99 % (01/28 0500) Last BM Date: 11/03/16  Intake/Output from previous day: 01/27 0701 - 01/28 0700 In: 1900 [I.V.:1800; IV Piggyback:100] Out: -  Intake/Output this shift: No intake/output data recorded. WDWN in NAD Eyes:  Pupils equal, round; sclera anicteric HENT:  Oral mucosa moist; good dentition  Neck:  No masses palpated, no thyromegaly Lungs:  CTA bilaterally; normal respiratory effort CV:  Regular rate and rhythm; no murmurs; extremities well-perfused with no edema Abd:  +bowel sounds, soft, mild epigastric tenderness; no hernias;  Incisions - c/d/i;no sign of infection   Lab Results:   Recent Labs  11/04/16 0522 11/05/16 0514  WBC 29.3* 23.9*  HGB 10.5* 10.1*  HCT 30.6* 29.0*  PLT 210 193   BMET  Recent Labs  11/03/16 0506 11/04/16 0522  NA 139 138  K 3.4* 4.1  CL 107 110  CO2 23 22  GLUCOSE 119* 119*  BUN 21* 18  CREATININE 0.53 0.63  CALCIUM 8.6* 8.4*   Lab Results  Component Value Date   ALT 50 11/03/2016   AST 30 11/03/2016   ALKPHOS 171 (H) 11/03/2016   BILITOT 1.2 11/03/2016    PT/INR No results for input(s): LABPROT, INR in the last 72 hours. ABG No results for input(s): PHART, HCO3 in the last 72 hours.  Invalid input(s): PCO2, PO2  Studies/Results: Nm Hepatobiliary Liver Func  Result Date: 11/04/2016 CLINICAL DATA:  81 year old female with continued abdominal pain following cholecystectomy on 10/30/2016. Evaluate for bile leak. EXAM: NUCLEAR MEDICINE HEPATOBILIARY IMAGING TECHNIQUE: Sequential images of the abdomen were obtained out to 60 minutes following  intravenous administration of radiopharmaceutical. RADIOPHARMACEUTICALS:  5.1 mCi Tc-50m  Choletec IV COMPARISON:  None. FINDINGS: Prompt uptake and biliary excretion of activity by the liver is seen. Small bowel activity is identified at 20 minutes. No unexpected activity identified to suggest a bile leak. IMPRESSION: No evidence of bile leak. Prompt hepatobiliary uptake and excretion into the CBD and bowel. No evidence of biliary obstruction. Electronically Signed   By: Margarette Canada M.D.   On: 11/04/2016 12:46    Anti-infectives: Anti-infectives    Start     Dose/Rate Route Frequency Ordered Stop   11/04/16 0900  piperacillin-tazobactam (ZOSYN) IVPB 3.375 g     3.375 g 12.5 mL/hr over 240 Minutes Intravenous Every 8 hours 11/04/16 0833     10/30/16 1600  piperacillin-tazobactam (ZOSYN) IVPB 3.375 g  Status:  Discontinued     3.375 g 12.5 mL/hr over 240 Minutes Intravenous Every 8 hours 10/30/16 1539 11/01/16 1814   10/29/16 1200  piperacillin-tazobactam (ZOSYN) IVPB 3.375 g  Status:  Discontinued     3.375 g 12.5 mL/hr over 240 Minutes Intravenous Every 8 hours 10/29/16 0941 10/30/16 1539      Assessment/Plan: Principal Problem:   S/P laparoscopic cholecystectomy for necrotic gallbaldder October 30, 2016- Still with some abdominal pains; WBC decreased to 23; CT no abscess, HIDA negative.  Check lipase to rule out pancreatitis Continue Zosyn for now.   LOS: 8 days    Melanie Gingrich K. 11/05/2016

## 2016-11-05 NOTE — Progress Notes (Signed)
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  She states she is followed by Dr. Gilford Rile of infectious disease.

## 2016-11-06 DIAGNOSIS — R197 Diarrhea, unspecified: Secondary | ICD-10-CM

## 2016-11-06 LAB — CBC
HCT: 28.3 % — ABNORMAL LOW (ref 36.0–46.0)
HEMOGLOBIN: 9.5 g/dL — AB (ref 12.0–15.0)
MCH: 25.4 pg — ABNORMAL LOW (ref 26.0–34.0)
MCHC: 33.6 g/dL (ref 30.0–36.0)
MCV: 75.7 fL — ABNORMAL LOW (ref 78.0–100.0)
PLATELETS: 230 10*3/uL (ref 150–400)
RBC: 3.74 MIL/uL — ABNORMAL LOW (ref 3.87–5.11)
RDW: 14.4 % (ref 11.5–15.5)
WBC: 21.9 10*3/uL — ABNORMAL HIGH (ref 4.0–10.5)

## 2016-11-06 MED ORDER — SACCHAROMYCES BOULARDII 250 MG PO CAPS
250.0000 mg | ORAL_CAPSULE | Freq: Two times a day (BID) | ORAL | Status: DC
  Administered 2016-11-06 – 2016-11-16 (×16): 250 mg via ORAL
  Filled 2016-11-06 (×20): qty 1

## 2016-11-06 MED ORDER — BOOST / RESOURCE BREEZE PO LIQD
1.0000 | Freq: Three times a day (TID) | ORAL | Status: DC
  Administered 2016-11-06 (×2): 1 via ORAL

## 2016-11-06 MED ORDER — METHOCARBAMOL 500 MG PO TABS
500.0000 mg | ORAL_TABLET | Freq: Three times a day (TID) | ORAL | Status: DC | PRN
  Administered 2016-11-06 – 2016-11-13 (×5): 500 mg via ORAL
  Filled 2016-11-06 (×5): qty 1

## 2016-11-06 NOTE — Progress Notes (Signed)
PROGRESS NOTE   Melanie [name]  [id number]  DOB: 09-30-1936  DOA: 10/28/2016 PCP: Osborne Casco, MD  Hospital course:  Melanie [name] is a 81 y.o. female with medical history significant of gallstones presents to the hospital with the chief complaint of abdominal pain.  she had been experiencing abdominal pain for 4 weeks prior to this admission, had been diagnosed with gallstones and was scheduled to see general surgery this week. However due to worsening of her symptoms, she presented to the ED on 10/28/16. General surgery was consulted. She was assessed to have symptomatic cholelithiasis and choledocholithiasis. MRCP was negative for obstruction. She underwent laparoscopic cholecystectomy with IOC on 10/30/16 which showed necrotic gallbladder with free intraperitoneal bile, enlarged CBD with free flow into the duodenum. Slow postop progress. General surgery continues to follow.  Assessment & Plan:   1. Acute cholecystitis and Biliary colic -  s/p lap chole and negative IOC on 1/22.  Very slow and complicated postop progress. Ongoing issues with abdominal pain, diarrhea, poor oral intake and worsening leukocytosis. Infectious workup was initiated 1/26: Chest x-ray negative, CT abdomen and pelvis with contrast 11/02/16 without acute findings, urine microscopy not suggestive of UTI, C. difficile testing negative. Zosyn that had been stopped on 1/24 was reinitiated by surgery on 1/27. HIDA scan ruled out biliary leak. Transiently elevated LFTs have improved. Still with some abdominal pain. Diarrhea may be decreasing. No fevers. Leukocytosis improving. Normal lipase. Continue Zosyn and current diet. ? Infectious etiology but unclear source but improving on Zosyn. Surgery following. Discussed with surgery. May consider repeating CT abdomen prior to discharge.  2. Hypertension. Exacerbated by pain, Hydralazine IV ordered as needed. Started Amlodipine and Bystolic per home dose. Hold home clonidine  and HCTZ for now. Uncontrolled. Increase amlodipine to 10 MG daily. Monitor. Better.  3. COPD. Stable with no exacerbation.  4. GERD. Continue omeprazole.  5.  Hypokalemia - replace and follow as needed  6. AKI - resolved after IV fluid hydration.  7. Anemia: Stable.  8: Leukocytosis: As per problem #1. Improving  9. History of septic arthritis/right knee: Patient states that she had MRSA however has been on long-term amoxicillin prior to this admission. Plan to discuss with her infectious disease M.D. in [place]. Has been on Zosyn almost every day since admission except 2 days in between.  10. Diarrhea: Unclear etiology. C. difficile negative. Added probiotics.   DVT prophylaxis: heparin Code Status: Full  Family Communication:  discussed in detail with patient's 2 sons at bedside.. Disposition Plan: Home when medically stable.   Subjective: States that she is feeling better. Hungry. Still having multiple loose stools. No abdominal pain rated as intermittent, 3-4/10 in severity.  Objective: Vitals:   11/05/16 1400 11/05/16 2116 11/06/16 0537 11/06/16 0900  BP: (!) 147/54 (!) 165/66 (!) 152/51 (!) 160/65  Pulse: 76 82 76   Resp: 18 18 18    Temp: 98.9 F (37.2 C) 98 F (36.7 C) 99.1 F (37.3 C)   TempSrc: Oral Oral Oral   SpO2: 97% 100% 100%   Weight:      Height:        Intake/Output Summary (Last 24 hours) at 11/06/16 1234 Last data filed at 11/06/16 1000  Gross per 24 hour  Intake             1900 ml  Output              250 ml  Net  1650 ml   Filed Weights   10/28/16 0551  Weight: 70.3 kg (155 lb)   Exam:  General appearance: alert, cooperative and appears chronically ill looking but not uncomfortable or in distress. Resp: Clear to auscultation.No increased work of breathing. GI: soft, tender to palpation, mildly distended, wounds clean and dry. No rigidity, guarding or rebound. Normal bowel sounds heard. Abdominal distention seems better  today. Less tender Central nervous system: Alert and oriented. No focal neurological deficits. Extremities: no CCE.  Data Reviewed: Basic Metabolic Panel:  Recent Labs Lab 11/01/16 0451 11/02/16 0455 11/03/16 0506 11/04/16 0522 11/05/16 0512  NA 140 139 139 138 136  K 3.4* 3.7 3.4* 4.1 3.6  CL 109 109 107 110 108  CO2 21* 22 23 22  21*  GLUCOSE 136* 105* 119* 119* 105*  BUN 35* 30* 21* 18 16  CREATININE 0.99 0.82 0.53 0.63 0.63  CALCIUM 8.5* 8.5* 8.6* 8.4* 8.1*   Liver Function Tests:  Recent Labs Lab 10/31/16 1205 11/01/16 0451 11/02/16 0455 11/03/16 0506 11/05/16 0512  AST 141* 108* 51* 30 23  ALT 97* 90* 68* 50 31  ALKPHOS 148* 175* 161* 171* 164*  BILITOT 1.8* 2.0* 2.1* 1.2 0.7  PROT 7.0 5.8* 5.7* 6.0* 5.2*  ALBUMIN 2.8* 2.3* 2.2* 2.3* 2.1*    Recent Labs Lab 11/02/16 0455 11/05/16 0512  LIPASE 13 26   No results for input(s): AMMONIA in the last 168 hours. CBC:  Recent Labs Lab 10/31/16 1205  11/02/16 0455 11/03/16 0506 11/04/16 0522 11/05/16 0514 11/06/16 0423  WBC 11.6*  < > 10.8* 16.7* 29.3* 23.9* 21.9*  NEUTROABS 10.2*  --   --   --   --   --   --   HGB 12.3  < > 10.9* 11.3* 10.5* 10.1* 9.5*  HCT 36.7  < > 32.2* 33.1* 30.6* 29.0* 28.3*  MCV 79.6  < > 77.8* 76.4* 76.5* 76.3* 75.7*  PLT 274  < > 221 208 210 193 230  < > = values in this interval not displayed. Cardiac Enzymes: No results for input(s): CKTOTAL, CKMB, CKMBINDEX, TROPONINI in the last 168 hours. CBG (last 3)  No results for input(s): GLUCAP in the last 72 hours. Recent Results (from the past 240 hour(s))  Surgical PCR screen     Status: Abnormal   Collection Time: 10/30/16 10:59 AM  Result Value Ref Range Status   MRSA, PCR POSITIVE (A) NEGATIVE Final    Comment: RESULT CALLED TO, READ BACK BY AND VERIFIED WITH: K BRACEY AT 1626 ON 01.22.2018 BY NBROOKS    Staphylococcus aureus POSITIVE (A) NEGATIVE Final    Comment:        The Xpert SA Assay (FDA approved for NASAL  specimens in patients over 67 years of age), is one component of a comprehensive surveillance program.  Test performance has been validated by [facility] for patients greater than or equal to 34 year old. It is not intended to diagnose infection nor to guide or monitor treatment.   C difficile quick scan w PCR reflex     Status: None   Collection Time: 11/04/16  6:41 AM  Result Value Ref Range Status   C Diff antigen NEGATIVE NEGATIVE Final   C Diff toxin NEGATIVE NEGATIVE Final   C Diff interpretation No C. difficile detected.  Final     Studies: Nm Hepatobiliary Liver Func  Result Date: 11/04/2016 CLINICAL DATA:  81 year old female with continued abdominal pain following cholecystectomy on 10/30/2016. Evaluate for  bile leak. EXAM: NUCLEAR MEDICINE HEPATOBILIARY IMAGING TECHNIQUE: Sequential images of the abdomen were obtained out to 60 minutes following intravenous administration of radiopharmaceutical. RADIOPHARMACEUTICALS:  5.1 mCi Tc-6m  Choletec IV COMPARISON:  None. FINDINGS: Prompt uptake and biliary excretion of activity by the liver is seen. Small bowel activity is identified at 20 minutes. No unexpected activity identified to suggest a bile leak. IMPRESSION: No evidence of bile leak. Prompt hepatobiliary uptake and excretion into the CBD and bowel. No evidence of biliary obstruction. Electronically Signed   By: Margarette Canada M.D.   On: 11/04/2016 12:46   Scheduled Meds: . amLODipine  10 mg Oral Daily  . feeding supplement (ENSURE ENLIVE)  237 mL Oral BID BM  . heparin subcutaneous  5,000 Units Subcutaneous Q8H  . nebivolol  5 mg Oral Daily  . pantoprazole  40 mg Oral QHS  . piperacillin-tazobactam (ZOSYN)  IV  3.375 g Intravenous Q8H   Continuous Infusions: . dextrose 5 % and 0.45 % NaCl with KCl 20 mEq/L 50 mL/hr at 11/06/16 [id number]    Principal Problem:   S/P laparoscopic cholecystectomy for necrotic gallbaldder Jan 2018  [facility], MD, FACP, [facility]. [facility] Pager [phone]  If 7PM-7AM, please contact night-coverage [email or web address] Password [email or web address] 11/06/2016, 12:34 PM   LOS: 9 days

## 2016-11-06 NOTE — Care Management Important Message (Addendum)
Important Message  Patient Details IM Letter given to Suzanne/Case Manager to present to Patient Name: Melanie [name] MRN: [id number] Date of Birth: 1936-01-25   Medicare Important Message Given:  Yes    Kerin Salen 11/06/2016, 4:50 [place] Message  Patient Details  Name: Melanie [name] MRN: [id number] Date of Birth: 1935-11-15   Medicare Important Message Given:  Yes    Kerin Salen 11/06/2016, 4:50 PM

## 2016-11-06 NOTE — Progress Notes (Signed)
Patient ID: Melanie [name], female   DOB: Feb 18, 1936, 81 y.o.   MRN: [id number]  [facility] Progress Note  7 Days Post-Op  Subjective: States that today is the best day she has had since surgery. Reports pain at 3/10. She has not eaten anything yet today, but she was still having some nausea with full liquids yesterday. Multiple BM's yesterday. Pain is global about the abdomen, but improved.  Of note, states that she has taken daily amoxicillin x2 years since acquiring a postop knee infection after TKA. See ID.  Also states that she has had a chronic abdominal pain for several months. She has seen GI. Today feels like her baseline amount of pain.  Objective: Vital signs in last 24 hours: Temp:  [98 F (36.7 C)-99.1 F (37.3 C)] 99.1 F (37.3 C) (01/29 0537) Pulse Rate:  [76-82] 76 (01/29 0537) Resp:  [18] 18 (01/29 0537) BP: (147-165)/(51-66) 152/51 (01/29 0537) SpO2:  [97 %-100 %] 100 % (01/29 0537) Last BM Date: 11/03/16  Intake/Output from previous day: 01/28 0701 - 01/29 0700 In: 1840 [P.O.:240; I.V.:1400; IV Piggyback:200] Out: 300 [Urine:300] Intake/Output this shift: No intake/output data recorded.  PE: Gen: alert, NAD,pleasant Pulm: CTAB, diminished sounds bilateral bases,effort normal Abd: Soft, ND, +BS, mild global tenderness, incisions C/D/I, no hernias  Lab Results:   Recent Labs  11/05/16 0514 11/06/16 0423  WBC 23.9* 21.9*  HGB 10.1* 9.5*  HCT 29.0* 28.3*  PLT 193 230   BMET  Recent Labs  11/04/16 0522 11/05/16 0512  NA 138 136  K 4.1 3.6  CL 110 108  CO2 22 21*  GLUCOSE 119* 105*  BUN 18 16  CREATININE 0.63 0.63  CALCIUM 8.4* 8.1*   PT/INR No results for input(s): LABPROT, INR in the last 72 hours. CMP     Component Value Date/Time   NA 136 11/05/2016 0512   K 3.6 11/05/2016 0512   CL 108 11/05/2016 0512   CO2 21 (L) 11/05/2016 0512   GLUCOSE 105 (H) 11/05/2016 0512   BUN 16 11/05/2016 0512   CREATININE 0.63  11/05/2016 0512   CALCIUM 8.1 (L) 11/05/2016 0512   PROT 5.2 (L) 11/05/2016 0512   ALBUMIN 2.1 (L) 11/05/2016 0512   AST 23 11/05/2016 0512   ALT 31 11/05/2016 0512   ALKPHOS 164 (H) 11/05/2016 0512   BILITOT 0.7 11/05/2016 0512   GFRNONAA >60 11/05/2016 0512   GFRAA >60 11/05/2016 0512   Lipase     Component Value Date/Time   LIPASE 26 11/05/2016 0512       Studies/Results: Nm Hepatobiliary Liver Func  Result Date: 11/04/2016 CLINICAL DATA:  81 year old female with continued abdominal pain following cholecystectomy on 10/30/2016. Evaluate for bile leak. EXAM: NUCLEAR MEDICINE HEPATOBILIARY IMAGING TECHNIQUE: Sequential images of the abdomen were obtained out to 60 minutes following intravenous administration of radiopharmaceutical. RADIOPHARMACEUTICALS:  5.1 mCi Tc-32m  Choletec IV COMPARISON:  None. FINDINGS: Prompt uptake and biliary excretion of activity by the liver is seen. Small bowel activity is identified at 20 minutes. No unexpected activity identified to suggest a bile leak. IMPRESSION: No evidence of bile leak. Prompt hepatobiliary uptake and excretion into the CBD and bowel. No evidence of biliary obstruction. Electronically Signed   By: Margarette Canada M.D.   On: 11/04/2016 12:46    Anti-infectives: Anti-infectives    Start     Dose/Rate Route Frequency Ordered Stop   11/04/16 0900  piperacillin-tazobactam (ZOSYN) IVPB 3.375 g     3.375 g  12.5 mL/hr over 240 Minutes Intravenous Every 8 hours 11/04/16 0833     10/30/16 1600  piperacillin-tazobactam (ZOSYN) IVPB 3.375 g  Status:  Discontinued     3.375 g 12.5 mL/hr over 240 Minutes Intravenous Every 8 hours 10/30/16 1539 11/01/16 1814   10/29/16 1200  piperacillin-tazobactam (ZOSYN) IVPB 3.375 g  Status:  Discontinued     3.375 g 12.5 mL/hr over 240 Minutes Intravenous Every 8 hours 10/29/16 0941 10/30/16 1539       Assessment/Plan Symptomatic cholelithiasis S/p lap chole with IOC 1/22 Dr. Hassell Done - POD 7 - IOC  negative - LFT's WNL (1/28) - persistent leukocytosis, slightly improved today vs yesterday; afebrile. Patient has had normal postop CT, HIDA, CXR, c diff PCR, u/a  HTN - hydralazine PRN COPD GERD- protonix H/o infected knee after TKA - takes daily amoxicillin  ID - zosyn 1/21>>1/24, zosyn 1/27>> FEN - IVF, full liquids VTE - SCDs, heparin  Plan - leukocytosis slightly improved. Lipase WNL. Less abdominal pain today, although she has not eaten anything yet today. Continue full liquids. Continue to encourage mobilization/PT/IS.     LOS: 9 days    [name] , [facility] 11/06/2016, 8:34 AM Pager: [phone] Consults: [phone] Mon-Fri 7:00 am-4:30 pm Sat-Sun 7:00 am-11:30 am

## 2016-11-06 NOTE — Progress Notes (Signed)
PT Cancellation Note  Patient Details Name: Melanie [name] MRN: [id number] DOB: August 05, 1936   Cancelled Treatment:     pt in bed rocking back and forth with c/o ABD pain.  Declined any OOB activity.  Will check back later as schedule permits.  Pt has been evaluated.  See PT eval for rec.   Rica Koyanagi  PTA [facility]  Acute  Rehab Pager      [phone]

## 2016-11-07 ENCOUNTER — Inpatient Hospital Stay (HOSPITAL_COMMUNITY): Payer: Medicare Other

## 2016-11-07 DIAGNOSIS — K913 Postprocedural intestinal obstruction, unspecified as to partial versus complete: Secondary | ICD-10-CM

## 2016-11-07 LAB — BASIC METABOLIC PANEL
ANION GAP: 9 (ref 5–15)
BUN: 11 mg/dL (ref 6–20)
CHLORIDE: 106 mmol/L (ref 101–111)
CO2: 22 mmol/L (ref 22–32)
Calcium: 7.9 mg/dL — ABNORMAL LOW (ref 8.9–10.3)
Creatinine, Ser: 0.66 mg/dL (ref 0.44–1.00)
Glucose, Bld: 105 mg/dL — ABNORMAL HIGH (ref 65–99)
Potassium: 3.3 mmol/L — ABNORMAL LOW (ref 3.5–5.1)
SODIUM: 137 mmol/L (ref 135–145)

## 2016-11-07 LAB — CBC
HCT: 28.3 % — ABNORMAL LOW (ref 36.0–46.0)
HEMOGLOBIN: 9.6 g/dL — AB (ref 12.0–15.0)
MCH: 25.8 pg — ABNORMAL LOW (ref 26.0–34.0)
MCHC: 33.9 g/dL (ref 30.0–36.0)
MCV: 76.1 fL — ABNORMAL LOW (ref 78.0–100.0)
PLATELETS: 307 10*3/uL (ref 150–400)
RBC: 3.72 MIL/uL — AB (ref 3.87–5.11)
RDW: 14.3 % (ref 11.5–15.5)
WBC: 18.5 10*3/uL — AB (ref 4.0–10.5)

## 2016-11-07 MED ORDER — METOCLOPRAMIDE HCL 5 MG/ML IJ SOLN
5.0000 mg | Freq: Three times a day (TID) | INTRAMUSCULAR | Status: DC
Start: 2016-11-07 — End: 2016-11-12
  Administered 2016-11-07 – 2016-11-12 (×17): 5 mg via INTRAVENOUS
  Filled 2016-11-07 (×17): qty 2

## 2016-11-07 MED ORDER — PANTOPRAZOLE SODIUM 40 MG IV SOLR
40.0000 mg | INTRAVENOUS | Status: DC
  Administered 2016-11-07 – 2016-11-19 (×12): 40 mg via INTRAVENOUS
  Filled 2016-11-07 (×12): qty 40

## 2016-11-07 MED ORDER — POTASSIUM CHLORIDE CRYS ER 20 MEQ PO TBCR
40.0000 meq | EXTENDED_RELEASE_TABLET | Freq: Once | ORAL | Status: AC
  Administered 2016-11-07: 40 meq via ORAL
  Filled 2016-11-07: qty 2

## 2016-11-07 NOTE — Progress Notes (Signed)
Patient ID: Melanie [name], female   DOB: April 15, 1936, 81 y.o.   MRN: [id number]  [facility] Surgery Progress Note  8 Days Post-Op  Subjective: Continues to complain of intermittent severe abdominal pain. Pain is worse with eating. States that she feels hungry but when she eats she feels nauseated. Bowels functioning.  Did not work with PT yesterday due to pain.  Objective: Vital signs in last 24 hours: Temp:  [98.4 F (36.9 C)-98.8 F (37.1 C)] 98.5 F (36.9 C) (01/30 0537) Pulse Rate:  [70-74] 70 (01/30 0537) Resp:  [18] 18 (01/30 0537) BP: (147-160)/(51-65) 150/58 (01/30 0537) SpO2:  [100 %] 100 % (01/30 0537) Last BM Date: 11/06/16  Intake/Output from previous day: 01/29 0701 - 01/30 0700 In: 1950 [P.O.:600; I.V.:1200; IV Piggyback:150] Out: 1903 [Urine:1900; Stool:3] Intake/Output this shift: No intake/output data recorded.  PE: Gen: alert, NAD,pleasant Pulm: CTAB, diminished sounds bilateral bases,effort normal Abd: Soft, ND, +BS, mild global tenderness, incisions C/D/I, no hernias   Lab Results:   Recent Labs  11/06/16 0423 11/07/16 0447  WBC 21.9* 18.5*  HGB 9.5* 9.6*  HCT 28.3* 28.3*  PLT 230 307   BMET  Recent Labs  11/05/16 0512 11/07/16 0447  NA 136 137  K 3.6 3.3*  CL 108 106  CO2 21* 22  GLUCOSE 105* 105*  BUN 16 11  CREATININE 0.63 0.66  CALCIUM 8.1* 7.9*   PT/INR No results for input(s): LABPROT, INR in the last 72 hours. CMP     Component Value Date/Time   NA 137 11/07/2016 0447   K 3.3 (L) 11/07/2016 0447   CL 106 11/07/2016 0447   CO2 22 11/07/2016 0447   GLUCOSE 105 (H) 11/07/2016 0447   BUN 11 11/07/2016 0447   CREATININE 0.66 11/07/2016 0447   CALCIUM 7.9 (L) 11/07/2016 0447   PROT 5.2 (L) 11/05/2016 0512   ALBUMIN 2.1 (L) 11/05/2016 0512   AST 23 11/05/2016 0512   ALT 31 11/05/2016 0512   ALKPHOS 164 (H) 11/05/2016 0512   BILITOT 0.7 11/05/2016 0512   GFRNONAA >60 11/07/2016 0447   GFRAA >60 11/07/2016 0447    Lipase     Component Value Date/Time   LIPASE 26 11/05/2016 0512       Studies/Results: No results found.  Anti-infectives: Anti-infectives    Start     Dose/Rate Route Frequency Ordered Stop   11/04/16 0900  piperacillin-tazobactam (ZOSYN) IVPB 3.375 g     3.375 g 12.5 mL/hr over 240 Minutes Intravenous Every 8 hours 11/04/16 0833     10/30/16 1600  piperacillin-tazobactam (ZOSYN) IVPB 3.375 g  Status:  Discontinued     3.375 g 12.5 mL/hr over 240 Minutes Intravenous Every 8 hours 10/30/16 1539 11/01/16 1814   10/29/16 1200  piperacillin-tazobactam (ZOSYN) IVPB 3.375 g  Status:  Discontinued     3.375 g 12.5 mL/hr over 240 Minutes Intravenous Every 8 hours 10/29/16 0941 10/30/16 1539       Assessment/Plan Symptomatic cholelithiasis S/p lap chole with IOC 1/22 Dr. Hassell Done - POD 8 - IOC negative - LFT's WNL (1/28) - Patient has had normal postop CT, HIDA, CXR, c diff PCR, u/a - WBC trending down, 18.5 today; afebrile  HTN COPD GERD- protonix H/o infected knee after TKA - takes daily amoxicillin  ID - zosyn 1/21>>1/24, zosyn 1/27>> FEN - IVF, full liquids VTE - SCDs, heparin  Plan - leukocytosis improving. Patient will persistent nausea and abdominal pain with PO intake. Continue full liquids. Add reglan. Continue  to encourage mobilization/PT/IS. Plan to repeat CT prior to discharge.   LOS: 10 days    [name] , [facility] Surgery 11/07/2016, 8:36 AM Pager: [phone] Consults: [phone] Mon-Fri 7:00 am-4:30 pm Sat-Sun 7:00 am-11:30 am

## 2016-11-07 NOTE — Progress Notes (Signed)
PROGRESS NOTE   Melanie [name]  [id number]  DOB: 1936/07/10  DOA: 10/28/2016 PCP: Osborne Casco, MD  Hospital course:  Melanie [name] is a 81 y.o. female with medical history significant of gallstones presents to the hospital with the chief complaint of abdominal pain.  she had been experiencing abdominal pain for 4 weeks prior to this admission, had been diagnosed with gallstones and was scheduled to see general surgery this week. However due to worsening of her symptoms, she presented to the ED on 10/28/16. General surgery was consulted. She was assessed to have symptomatic cholelithiasis and choledocholithiasis. MRCP was negative for obstruction. She underwent laparoscopic cholecystectomy with IOC on 10/30/16 which showed necrotic gallbladder with free intraperitoneal bile, enlarged CBD with free flow into the duodenum. Slow postop progress. General surgery continues to follow.  Assessment & Plan:   1. Acute cholecystitis and Biliary colic -  s/p lap chole and negative IOC on 1/22.  Very slow and complicated postop progress. Ongoing issues with abdominal pain, diarrhea, poor oral intake and worsening leukocytosis. Infectious workup was initiated 1/26: Chest x-ray negative, CT abdomen and pelvis with contrast 11/02/16 without acute findings, urine microscopy not suggestive of UTI, C. difficile testing negative. Zosyn that had been stopped on 1/24 was reinitiated by surgery on 1/27. HIDA scan ruled out biliary leak. Transiently elevated LFTs have improved. Still with some abdominal pain. Diarrhea may be decreasing. No fevers. Leukocytosis improving. Normal lipase. Continue Zosyn. ? Infectious etiology but unclear source but improving on Zosyn. Surgery following. On 1/30, worsened abdominal pain. KUB suggests postop ileus. As per surgery, NG tube decompression at this point.  2. Hypertension. Exacerbated by pain, Hydralazine IV ordered as needed. Started Amlodipine and Bystolic per home dose.  Hold home clonidine and HCTZ for now. Uncontrolled. Increase amlodipine to 10 MG daily. Monitor. Better.  3. COPD. Stable with no exacerbation.  4. GERD. Continue omeprazole.  5.  Hypokalemia - replace and follow as needed  6. AKI - resolved after IV fluid hydration.  7. Anemia: Stable.  8: Leukocytosis: As per problem #1. Improving  9. History of septic arthritis/right knee: Patient states that she had MRSA however has been on long-term amoxicillin prior to this admission. Plan to discuss with her infectious disease M.D. in [place] (Dr. Melynda Keller). Has been on Zosyn almost every day since admission except 2 days in between. Left message for her infectious disease physician to call me back.  10. Diarrhea: Unclear etiology. C. difficile negative. Added probiotics.   DVT prophylaxis: heparin Code Status: Full  Family Communication:  None at bedside Disposition Plan: Home when medically stable.   Subjective: Seen this morning along with surgical team. Complained of worsening abdominal pain this morning associated with nausea. Abdominal distention. Ongoing loose stools (witnessed brown loose BM in bedside commode).  Objective: Vitals:   11/06/16 2230 11/07/16 0537 11/07/16 0900 11/07/16 1059  BP: (!) 150/64 (!) 150/58 125/90 (!) 166/57  Pulse: 70 70 79 80  Resp: 18 18    Temp: 98.4 F (36.9 C) 98.5 F (36.9 C) 97.8 F (36.6 C)   TempSrc: Oral  Oral   SpO2: 100% 100% 100%   Weight:      Height:        Intake/Output Summary (Last 24 hours) at 11/07/16 1450 Last data filed at 11/07/16 1255  Gross per 24 hour  Intake             1160 ml  Output  2353 ml  Net            -1193 ml   Filed Weights   10/28/16 0551  Weight: 70.3 kg (155 lb)   Exam:  General appearance: Pleasant elderly female, moderately built, chronically ill-looking, lying uncomfortably propped up in bed with painful distress. Resp: Clear to auscultation.No increased work of  breathing. CVS: S1 and S2 heard, RRR. No JVD, murmurs or pedal edema. GI: Mild to moderate abdominal distention, diffuse mild tenderness, especially in the upper abdomen without rigidity, guarding or rebound. Diminished bowel sounds. Central nervous system: Alert and oriented. No focal neurological deficits. Extremities: no CCE.  Data Reviewed: Basic Metabolic Panel:  Recent Labs Lab 11/02/16 0455 11/03/16 0506 11/04/16 0522 11/05/16 0512 11/07/16 0447  NA 139 139 138 136 137  K 3.7 3.4* 4.1 3.6 3.3*  CL 109 107 110 108 106  CO2 22 23 22  21* 22  GLUCOSE 105* 119* 119* 105* 105*  BUN 30* 21* 18 16 11   CREATININE 0.82 0.53 0.63 0.63 0.66  CALCIUM 8.5* 8.6* 8.4* 8.1* 7.9*   Liver Function Tests:  Recent Labs Lab 11/01/16 0451 11/02/16 0455 11/03/16 0506 11/05/16 0512  AST 108* 51* 30 23  ALT 90* 68* 50 31  ALKPHOS 175* 161* 171* 164*  BILITOT 2.0* 2.1* 1.2 0.7  PROT 5.8* 5.7* 6.0* 5.2*  ALBUMIN 2.3* 2.2* 2.3* 2.1*    Recent Labs Lab 11/02/16 0455 11/05/16 0512  LIPASE 13 26   No results for input(s): AMMONIA in the last 168 hours. CBC:  Recent Labs Lab 11/03/16 0506 11/04/16 0522 11/05/16 0514 11/06/16 0423 11/07/16 0447  WBC 16.7* 29.3* 23.9* 21.9* 18.5*  HGB 11.3* 10.5* 10.1* 9.5* 9.6*  HCT 33.1* 30.6* 29.0* 28.3* 28.3*  MCV 76.4* 76.5* 76.3* 75.7* 76.1*  PLT 208 210 193 230 307   Cardiac Enzymes: No results for input(s): CKTOTAL, CKMB, CKMBINDEX, TROPONINI in the last 168 hours. CBG (last 3)  No results for input(s): GLUCAP in the last 72 hours. Recent Results (from the past 240 hour(s))  Surgical PCR screen     Status: Abnormal   Collection Time: 10/30/16 10:59 AM  Result Value Ref Range Status   MRSA, PCR POSITIVE (A) NEGATIVE Final    Comment: RESULT CALLED TO, READ BACK BY AND VERIFIED WITH: K BRACEY AT 1626 ON 01.22.2018 BY NBROOKS    Staphylococcus aureus POSITIVE (A) NEGATIVE Final    Comment:        The Xpert SA Assay (FDA approved  for NASAL specimens in patients over 89 years of age), is one component of a comprehensive surveillance program.  Test performance has been validated by [facility] for patients greater than or equal to 43 year old. It is not intended to diagnose infection nor to guide or monitor treatment.   C difficile quick scan w PCR reflex     Status: None   Collection Time: 11/04/16  6:41 AM  Result Value Ref Range Status   C Diff antigen NEGATIVE NEGATIVE Final   C Diff toxin NEGATIVE NEGATIVE Final   C Diff interpretation No C. difficile detected.  Final     Studies: Dg Abd Portable 1v  Result Date: 11/07/2016 CLINICAL DATA:  81 year old female with abdominal pain and vomiting. Postcholecystectomy 10/30/2016. Subsequent encounter. EXAM: PORTABLE ABDOMEN - 1 VIEW COMPARISON:  11/02/2016 abdominal CT. FINDINGS: Persistent gas distended small bowel loops measuring up to 4.2 cm. Upright view not obtained to evaluate for the possibly of free air. Gas-filled  stomach. Question if patient would benefit from nasogastric tube decompression? Post cholecystectomy and lumbar spine surgery. Eggshell calcification right aspect of pelvis is noted to be in the right buttock region on CT scan and does not represent a gallstone. IMPRESSION: Persistent gas distended small bowel loops and gas-filled stomach which may indicate postoperative ileus given the clinical presentation. Small bowel obstruction is a secondary less likely consideration. Electronically Signed   By: Genia Del M.D.   On: 11/07/2016 10:17   Scheduled Meds: . amLODipine  10 mg Oral Daily  . feeding supplement  1 Container Oral TID BM  . feeding supplement (ENSURE ENLIVE)  237 mL Oral BID BM  . heparin subcutaneous  5,000 Units Subcutaneous Q8H  . metoCLOPramide (REGLAN) injection  5 mg Intravenous Q8H  . nebivolol  5 mg Oral Daily  . pantoprazole  40 mg Oral QHS  . piperacillin-tazobactam (ZOSYN)  IV  3.375 g Intravenous Q8H  . saccharomyces  boulardii  250 mg Oral BID   Continuous Infusions: . dextrose 5 % and 0.45 % NaCl with KCl 20 mEq/L 50 mL/hr at 11/06/16 2249    Principal Problem:   S/P laparoscopic cholecystectomy for necrotic gallbaldder Jan 2018  [facility], MD, FACP, [facility]. [facility] Pager [phone]  If 7PM-7AM, please contact night-coverage [email or web address] Password [email or web address] 11/07/2016, 2:50 PM   LOS: 10 days

## 2016-11-07 NOTE — Progress Notes (Signed)
Physical Therapy Treatment Patient Details Name: Melanie [name] MRN: [id number] DOB: [birth year] Today's Date: 11/07/2016    History of Present Illness 81 y.o. female with medical history significant of gallstones presents to the hospital with the chief complaint of abdominal pain. s/p laparoscopic cholecystectomy 10/30/16.    PT Comments    Pt tolerated increased gait distance of 160' with RW and min/guard assist. She reports continued abdominal pain.  Plan for NG tube placement later today.   Follow Up Recommendations  Home health PT     Equipment Recommendations  Rolling walker with 5" wheels;Other (comment) (shower seat)    Recommendations for Other Services       Precautions / Restrictions Precautions Precautions: None Precaution Comments: pt/family deny h/o falls in past 1 year; abdominal surgery Restrictions Weight Bearing Restrictions: No    Mobility  Bed Mobility Overal bed mobility: Needs Assistance Bed Mobility: Supine to Sit   Sidelying to sit: Min assist Supine to sit: Min assist     General bed mobility comments: increased time and use of bed pad to complete scooting to EOB due to ABD pain; min A for LEs into bed  Transfers Overall transfer level: Needs assistance Equipment used: Rolling walker (2 wheeled) Transfers: Sit to/from Stand Sit to Stand: Min guard         General transfer comment: VCs hand placement  Ambulation/Gait Ambulation/Gait assistance: Min guard Ambulation Distance (Feet): 160 Feet Assistive device: Rolling walker (2 wheeled) Gait Pattern/deviations: Step-through pattern;Decreased step length - right;Decreased step length - left;Trunk flexed Gait velocity: decr   General Gait Details: VCs for posture, min/guard for safety, increased time, pt reports trunk flexion is baseline 2* h/o back surgeries (standing fully upright causes back pain), no loss of balance   Stairs            Wheelchair Mobility    Modified Rankin  (Stroke Patients Only)       Balance Overall balance assessment: Needs assistance   Sitting balance-Leahy Scale: Good       Standing balance-Leahy Scale: Fair                      Cognition Arousal/Alertness: Awake/alert Behavior During Therapy: WFL for tasks assessed/performed Overall Cognitive Status: Within Functional Limits for tasks assessed                      Exercises      General Comments        Pertinent Vitals/Pain Pain Score: 3  Pain Location: abdomen Pain Descriptors / Indicators: Aching Pain Intervention(s): Limited activity within patient's tolerance;Monitored during session;Premedicated before session    Home Living                      Prior Function            PT Goals (current goals can now be found in the care plan section) Acute Rehab PT Goals Patient Stated Goal: return home to prior level of function PT Goal Formulation: With patient/family Time For Goal Achievement: 11/16/16 Potential to Achieve Goals: Good Progress towards PT goals: Progressing toward goals    Frequency    Min 3X/week      PT Plan Current plan remains appropriate    Co-evaluation             End of Session Equipment Utilized During Treatment: Gait belt Activity Tolerance: Patient tolerated treatment well Patient left: with call bell/phone within  reach;with family/visitor present;in bed     Time: [id number] PT Time Calculation (min) (ACUTE ONLY): 25 min  Charges:  $Gait Training: 8-22 mins $Therapeutic Activity: 8-22 mins                    G Codes:      Melanie [name] 11/07/2016, 2:00 PM [phone]

## 2016-11-08 DIAGNOSIS — K567 Ileus, unspecified: Secondary | ICD-10-CM

## 2016-11-08 DIAGNOSIS — K9189 Other postprocedural complications and disorders of digestive system: Secondary | ICD-10-CM

## 2016-11-08 LAB — BASIC METABOLIC PANEL
ANION GAP: 9 (ref 5–15)
BUN: 10 mg/dL (ref 6–20)
CALCIUM: 8 mg/dL — AB (ref 8.9–10.3)
CO2: 23 mmol/L (ref 22–32)
Chloride: 107 mmol/L (ref 101–111)
Creatinine, Ser: 0.57 mg/dL (ref 0.44–1.00)
GFR calc Af Amer: >60 mL/min (ref >60)
GFR calc non Af Amer: >60 mL/min (ref >60)
GLUCOSE: 107 mg/dL — AB (ref 65–99)
Potassium: 3.6 mmol/L (ref 3.5–5.1)
Sodium: 139 mmol/L (ref 135–145)

## 2016-11-08 LAB — CBC
HCT: 28.1 % — ABNORMAL LOW (ref 36.0–46.0)
HEMOGLOBIN: 9.8 g/dL — AB (ref 12.0–15.0)
MCH: 26.5 pg (ref 26.0–34.0)
MCHC: 34.9 g/dL (ref 30.0–36.0)
MCV: 75.9 fL — ABNORMAL LOW (ref 78.0–100.0)
Platelets: 356 10*3/uL (ref 150–400)
RBC: 3.7 MIL/uL — ABNORMAL LOW (ref 3.87–5.11)
RDW: 14.4 % (ref 11.5–15.5)
WBC: 15.8 10*3/uL — ABNORMAL HIGH (ref 4.0–10.5)

## 2016-11-08 LAB — GLUCOSE, CAPILLARY
GLUCOSE-CAPILLARY: 153 mg/dL — AB (ref 65–99)
Glucose-Capillary: 113 mg/dL — ABNORMAL HIGH (ref 65–99)

## 2016-11-08 LAB — PHOSPHORUS: Phosphorus: 2.6 mg/dL (ref 2.5–4.6)

## 2016-11-08 LAB — MAGNESIUM: MAGNESIUM: 1.2 mg/dL — AB (ref 1.7–2.4)

## 2016-11-08 MED ORDER — FAT EMULSION 20 % IV EMUL
240.0000 mL | INTRAVENOUS | Status: AC
  Administered 2016-11-08: 240 mL via INTRAVENOUS
  Filled 2016-11-08: qty 250

## 2016-11-08 MED ORDER — TRACE MINERALS CR-CU-MN-SE-ZN 10-1000-500-60 MCG/ML IV SOLN
INTRAVENOUS | Status: AC
  Administered 2016-11-08: 17:00:00 via INTRAVENOUS
  Filled 2016-11-08: qty 960

## 2016-11-08 MED ORDER — MAGNESIUM SULFATE 4 GM/100ML IV SOLN
4.0000 g | Freq: Once | INTRAVENOUS | Status: AC
  Administered 2016-11-08: 4 g via INTRAVENOUS
  Filled 2016-11-08: qty 100

## 2016-11-08 MED ORDER — MENTHOL 3 MG MT LOZG
1.0000 | LOZENGE | OROMUCOSAL | Status: DC | PRN
  Filled 2016-11-08: qty 9

## 2016-11-08 MED ORDER — SODIUM CHLORIDE 0.9% FLUSH
10.0000 mL | INTRAVENOUS | Status: DC | PRN
  Administered 2016-11-10 – 2016-12-07 (×16): 10 mL
  Filled 2016-11-08 (×16): qty 40

## 2016-11-08 MED ORDER — INSULIN ASPART 100 UNIT/ML ~~LOC~~ SOLN
0.0000 [IU] | Freq: Four times a day (QID) | SUBCUTANEOUS | Status: DC
  Administered 2016-11-09 (×2): 1 [IU] via SUBCUTANEOUS
  Administered 2016-11-09: 2 [IU] via SUBCUTANEOUS
  Administered 2016-11-09 – 2016-11-10 (×2): 1 [IU] via SUBCUTANEOUS
  Administered 2016-11-10: 2 [IU] via SUBCUTANEOUS
  Administered 2016-11-10 – 2016-11-11 (×3): 1 [IU] via SUBCUTANEOUS
  Administered 2016-11-11: 2 [IU] via SUBCUTANEOUS
  Administered 2016-11-12 – 2016-11-16 (×9): 1 [IU] via SUBCUTANEOUS
  Administered 2016-11-17: 2 [IU] via SUBCUTANEOUS
  Administered 2016-11-18 (×2): 1 [IU] via SUBCUTANEOUS

## 2016-11-08 MED ORDER — POTASSIUM CHLORIDE 10 MEQ/100ML IV SOLN
10.0000 meq | INTRAVENOUS | Status: AC
  Administered 2016-11-08 (×2): 10 meq via INTRAVENOUS
  Filled 2016-11-08 (×2): qty 100

## 2016-11-08 MED ORDER — POTASSIUM PHOSPHATES 15 MMOLE/5ML IV SOLN
10.0000 mmol | Freq: Once | INTRAVENOUS | Status: AC
  Administered 2016-11-08: 10 mmol via INTRAVENOUS
  Filled 2016-11-08: qty 3.33

## 2016-11-08 NOTE — Progress Notes (Signed)
PROGRESS NOTE   Melanie [name]  [id number]  DOB: 1936-02-20  DOA: 10/28/2016 PCP: Osborne Casco, MD  Hospital course:  Melanie [name] is a 81 y.o. female with medical history significant of gallstones presents to the hospital with the chief complaint of abdominal pain.  she had been experiencing abdominal pain for 4 weeks prior to this admission, had been diagnosed with gallstones and was scheduled to see general surgery this week. However due to worsening of her symptoms, she presented to the ED on 10/28/16. General surgery was consulted. She was assessed to have symptomatic cholelithiasis and choledocholithiasis. MRCP was negative for obstruction. She underwent laparoscopic cholecystectomy with IOC on 10/30/16 which showed necrotic gallbladder with free intraperitoneal bile, enlarged CBD with free flow into the duodenum. Slow postop progress. General surgery continues to follow.  Assessment & Plan:   1. Acute cholecystitis and Biliary colic -  s/p lap chole and negative IOC on 1/22.  Very slow and complicated postop progress. Ongoing issues with abdominal pain, diarrhea, poor oral intake and worsening leukocytosis. Infectious workup was initiated 1/26: Chest x-ray negative, CT abdomen and pelvis with contrast 11/02/16 without acute findings, urine microscopy not suggestive of UTI, C. difficile testing negative. Zosyn that had been stopped on 1/24 was reinitiated by surgery on 1/27. HIDA scan ruled out biliary leak. Transiently elevated LFTs have improved. Still with some abdominal pain. Diarrhea may be decreasing. No fevers. Leukocytosis improving. Normal lipase. Continue Zosyn. ? Infectious etiology but unclear source but improving on Zosyn. Surgery following. On 1/30, worsened abdominal pain >postop ileus on abdominal x-ray. Treating with NG tube decompression and IV fluids. Improving. Surgery starting PICC line and TPN on 1/31.   2. Hypertension. Exacerbated by pain, Hydralazine IV  ordered as needed. Started Amlodipine and Bystolic per home dose. Hold home clonidine and HCTZ for now. Uncontrolled. Increase amlodipine to 10 MG daily. Monitor. Better.  3. COPD. Stable with no exacerbation.  4. GERD. Continue omeprazole.  5.  Hypokalemia/hypomagnesemia - Replace and follow as needed   6. AKI - resolved after IV fluid hydration.  7. Anemia: Stable.  8: Leukocytosis: As per problem #1. Improving  9. History of septic arthritis/right knee: Patient states that she had MRSA however has been on long-term amoxicillin prior to this admission. Plan to discuss with her infectious disease M.D. in [place] (Dr. Melynda Keller). Has been on Zosyn almost every day since admission except 2 days in between. Left message for her infectious disease physician to call me back >have not heard back yet.  10. Diarrhea: Unclear etiology. C. difficile negative. Added probiotics.   DVT prophylaxis: heparin Code Status: Full  Family Communication:  None at bedside Disposition Plan: Home when medically stable.   Subjective: Seen this morning. Abdominal pain is better. Diarrhea improving.  Objective: Vitals:   11/07/16 1623 11/07/16 2230 11/08/16 0556 11/08/16 0904  BP: (!) 180/61 (!) 146/49 (!) 135/45 132/78  Pulse:  62 80   Resp:  16 16   Temp: 98.5 F (36.9 C) 97.8 F (36.6 C) 98.4 F (36.9 C)   TempSrc: Oral Oral Oral   SpO2:  100% 100%   Weight:      Height:        Intake/Output Summary (Last 24 hours) at 11/08/16 1430 Last data filed at 11/08/16 0912  Gross per 24 hour  Intake              800 ml  Output  1550 ml  Net             -750 ml   Filed Weights   10/28/16 0551  Weight: 70.3 kg (155 lb)   Exam:  General appearance: Pleasant elderly female, moderately built, chronically ill-looking, Looks better and comfortable compared to yesterday morning. Resp: Clear to auscultation.No increased work of breathing. CVS: S1 and S2 heard, RRR. No JVD, murmurs  or pedal edema. GI: Improved abdominal distention. NG tube draining bilious material and bedside canister almost full. Mild epigastric tenderness without rigidity, guarding or rebound. Few bowel sounds heard. Central nervous system: Alert and oriented. No focal neurological deficits. Extremities: no CCE.  Data Reviewed: Basic Metabolic Panel:  Recent Labs Lab 11/03/16 0506 11/04/16 0522 11/05/16 0512 11/07/16 0447 11/08/16 0447  NA 139 138 136 137 139  K 3.4* 4.1 3.6 3.3* 3.6  CL 107 110 108 106 107  CO2 23 22 21* 22 23  GLUCOSE 119* 119* 105* 105* 107*  BUN 21* 18 16 11 10   CREATININE 0.53 0.63 0.63 0.66 0.57  CALCIUM 8.6* 8.4* 8.1* 7.9* 8.0*  MG  --   --   --   --  1.2*  PHOS  --   --   --   --  2.6   Liver Function Tests:  Recent Labs Lab 11/02/16 0455 11/03/16 0506 11/05/16 0512  AST 51* 30 23  ALT 68* 50 31  ALKPHOS 161* 171* 164*  BILITOT 2.1* 1.2 0.7  PROT 5.7* 6.0* 5.2*  ALBUMIN 2.2* 2.3* 2.1*    Recent Labs Lab 11/02/16 0455 11/05/16 0512  LIPASE 13 26   No results for input(s): AMMONIA in the last 168 hours. CBC:  Recent Labs Lab 11/04/16 0522 11/05/16 0514 11/06/16 0423 11/07/16 0447 11/08/16 0447  WBC 29.3* 23.9* 21.9* 18.5* 15.8*  HGB 10.5* 10.1* 9.5* 9.6* 9.8*  HCT 30.6* 29.0* 28.3* 28.3* 28.1*  MCV 76.5* 76.3* 75.7* 76.1* 75.9*  PLT 210 193 230 307 356   Cardiac Enzymes: No results for input(s): CKTOTAL, CKMB, CKMBINDEX, TROPONINI in the last 168 hours. CBG (last 3)  No results for input(s): GLUCAP in the last 72 hours. Recent Results (from the past 240 hour(s))  Surgical PCR screen     Status: Abnormal   Collection Time: 10/30/16 10:59 AM  Result Value Ref Range Status   MRSA, PCR POSITIVE (A) NEGATIVE Final    Comment: RESULT CALLED TO, READ BACK BY AND VERIFIED WITH: K BRACEY AT 1626 ON 01.22.2018 BY NBROOKS    Staphylococcus aureus POSITIVE (A) NEGATIVE Final    Comment:        The Xpert SA Assay (FDA approved for NASAL  specimens in patients over 43 years of age), is one component of a comprehensive surveillance program.  Test performance has been validated by [facility] for patients greater than or equal to 74 year old. It is not intended to diagnose infection nor to guide or monitor treatment.   C difficile quick scan w PCR reflex     Status: None   Collection Time: 11/04/16  6:41 AM  Result Value Ref Range Status   C Diff antigen NEGATIVE NEGATIVE Final   C Diff toxin NEGATIVE NEGATIVE Final   C Diff interpretation No C. difficile detected.  Final     Studies: Dg Abd Portable 1v  Result Date: 11/07/2016 CLINICAL DATA:  Check nasogastric catheter placement EXAM: PORTABLE ABDOMEN - 1 VIEW COMPARISON:  Film from earlier in the same day FINDINGS: Nasogastric  catheter is noted coiled within the stomach. Scattered large and small bowel gas is again identified with mild small bowel dilatation. No free air is seen. Postsurgical changes in the lumbar spine are noted. IMPRESSION: Nasogastric catheter within the stomach. Electronically Signed   By: Inez Catalina M.D.   On: 11/07/2016 15:27   Dg Abd Portable 1v  Result Date: 11/07/2016 CLINICAL DATA:  81 year old female with abdominal pain and vomiting. Postcholecystectomy 10/30/2016. Subsequent encounter. EXAM: PORTABLE ABDOMEN - 1 VIEW COMPARISON:  11/02/2016 abdominal CT. FINDINGS: Persistent gas distended small bowel loops measuring up to 4.2 cm. Upright view not obtained to evaluate for the possibly of free air. Gas-filled stomach. Question if patient would benefit from nasogastric tube decompression? Post cholecystectomy and lumbar spine surgery. Eggshell calcification right aspect of pelvis is noted to be in the right buttock region on CT scan and does not represent a gallstone. IMPRESSION: Persistent gas distended small bowel loops and gas-filled stomach which may indicate postoperative ileus given the clinical presentation. Small bowel obstruction is a  secondary less likely consideration. Electronically Signed   By: Genia Del M.D.   On: 11/07/2016 10:17   Scheduled Meds: . amLODipine  10 mg Oral Daily  . heparin subcutaneous  5,000 Units Subcutaneous Q8H  . metoCLOPramide (REGLAN) injection  5 mg Intravenous Q8H  . nebivolol  5 mg Oral Daily  . pantoprazole (PROTONIX) IV  40 mg Intravenous Q24H  . piperacillin-tazobactam (ZOSYN)  IV  3.375 g Intravenous Q8H  . potassium chloride  10 mEq Intravenous Q1 Hr x 2  . potassium phosphate IVPB (mmol)  10 mmol Intravenous Once  . saccharomyces boulardii  250 mg Oral BID   Continuous Infusions: . dextrose 5 % and 0.45 % NaCl with KCl 20 mEq/L 50 mL/hr at 11/07/16 1831    Principal Problem:   S/P laparoscopic cholecystectomy for necrotic gallbaldder Jan 2018  [facility], MD, FACP, [facility]. [facility] Pager [phone]  If 7PM-7AM, please contact night-coverage [email or web address] Password [email or web address] 11/08/2016, 2:30 PM   LOS: 11 days

## 2016-11-08 NOTE — Progress Notes (Signed)
[place] NOTE   Pharmacy Consult for TPN Indication: prolonged ileus  Patient Measurements: Height: 5\' 3"  (160 cm) Weight: 155 lb (70.3 kg) IBW/kg (Calculated) : 52.4 TPN AdjBW (KG): 56.9 Body mass index is 27.46 kg/m. Usual Weight:   Insulin Requirements: no hx DM; not currently on insulin  Current Nutrition: ensure bid started on 1/25 (last dose received was on 1/26); boost TID started on 1/29 (last dose received on 1/29) - now NPO  IVF: D5 0.45NS with 20 KCL at 50 ml/hr  Central access: pending TPN start date: pending PICC placement  ASSESSMENT                                                                                                          HPI: Patient is [name] with hx of gallstones presented to the ED on 1/20 with c/o abdominal pain. MRCP on 1/22 showed distended gallbladder with tiny gallstones and wall thickening with suspicion for acute cholecystitis.  Patient underwent lap cholecystectomy with intraoperative cholangiogram on 1/22.  To start TPN for prolonged ileus and poor oral intake.  Significant events:  1/22: lap chole with cholangiogram 1/30: abd KUB with suspicion for post-op ileus; NG tube placed  Today:    Glucose (goal <150): wnl  Electrolytes: k 3.6, CorrCa wnl, Mag low at 1.2, phos 2.6  Renal: scr ok (crcl~54)  LFTs: wnl with labs on 1/28  TGs: pending  Prealbumin: pending  NUTRITIONAL GOALS                                                                                             RD recs: Kcal:  1750-1950 Protein:  80-90g Fluid:  1.8L/day  Clinimix E 5/20 at a goal rate of 70 ml/hr + 20% fat emulsion at 20 ml/hr to provide: 84 g/day protein, 1958 Kcal/day.  (+++There is currently a Psychologist, prison and probation services of Clinimix solution. Pharmacy will use Clinimix product based on what we have available in stock+++)  PLAN  Now: - Magnesium 4 gm x1 per MD - potassium chloride 10 meq IV x2 runs - potassium phosphate 10 mmol IV x1  At 1800 today:  Start Clinimix E 5/20  at 40 ml/hr.  20% fat emulsion at 20 ml/hr.  Plan to advance as tolerated to the goal rate.  Trace elements are on Producer, television/film/video. As per current [place] standard, trace elements will be provided every other day, multivitamins will be added daily.        Add trace elements to TPN bag on 1/31  D/c IV fluid  Add sensitive SSI q6h   TPN lab panels on Mondays & Thursdays.  F/u daily.  Patient is at risk for refeeding syndrome -- will monitor electrolytes closely   Melanie [name] P 11/08/2016,9:45 AM

## 2016-11-08 NOTE — Progress Notes (Signed)
Peripherally Inserted Central Catheter/Midline Placement  The IV Nurse has discussed with the patient and/or persons authorized to consent for the patient, the purpose of this procedure and the potential benefits and risks involved with this procedure.  The benefits include less needle sticks, lab draws from the catheter, and the patient may be discharged home with the catheter. Risks include, but not limited to, infection, bleeding, blood clot (thrombus formation), and puncture of an artery; nerve damage and irregular heartbeat and possibility to perform a PICC exchange if needed/ordered by physician.  Alternatives to this procedure were also discussed.  B[employer] Power PICC patient education guide, fact sheet on infection prevention and patient information card has been provided to patient /or left at bedside.    PICC/Midline Placement Documentation  PICC Double Lumen 11/08/16 PICC Right Brachial 38 cm 1 cm (Active)  Indication for Insertion or Continuance of Line Administration of hyperosmolar/irritating solutions (i.e. TPN, Vancomycin, etc.) 11/08/2016  2:00 PM  Exposed Catheter (cm) 1 cm 11/08/2016  2:00 PM  Dressing Change Due 11/15/16 11/08/2016  2:00 PM       Jule Economy Horton 11/08/2016, 2:20 PM

## 2016-11-08 NOTE — Progress Notes (Signed)
Initial Nutrition Assessment  DOCUMENTATION CODES:   Severe malnutrition in context of acute illness/injury  INTERVENTION:   Monitor magnesium, potassium, and phosphorus daily for at least 3 days, MD to replete as needed, as pt is at risk for refeeding syndrome given severe malnutrition, poor PO intake >11 days, and low Mg levels.  TPN per Pharmacy RD to continue to monitor  NUTRITION DIAGNOSIS:   Malnutrition related to acute illness as evidenced by energy intake < or equal to 50% for > or equal to 5 days, mild depletion of body fat, severe depletion of muscle mass.  GOAL:   Patient will meet greater than or equal to 90% of their needs  MONITOR:   Diet advancement, Weight trends, Labs, I & O's, Other (Comment) (TPN)  REASON FOR ASSESSMENT:   Consult, LOS New TPN/TNA  ASSESSMENT:   81 y.o. female with medical history significant of gallstones presents to the hospital with the chief complaint of abdominal pain.  she had been experiencing abdominal pain for 4 weeks prior to this admission, had been diagnosed with gallstones and was scheduled to see general surgery this week. However due to worsening of her symptoms, she presented to the ED on 10/28/16. General surgery was consulted. She was assessed to have symptomatic cholelithiasis and choledocholithiasis. MRCP was negative for obstruction. She underwent laparoscopic cholecystectomy with IOC on 10/30/16 which showed necrotic gallbladder with free intraperitoneal bile, enlarged CBD with free flow into the duodenum. 1/22: s/p Laparoscopic Cholecystectomy with intraoperative cholangiogram  Patient in room with son at bedside. Pt reports continued nausea and pain. Pt states the nausea is better today d/t NGT. Output: 1L since yesterday. Pt has been unable to tolerate a diet for 11 days now. Pt states she would take a bite or a sip of water and instantly become nauseous. Pt tried to drink Ensure drinks but would become nauseous with those  as well.  PTA pt states she was having intermittent N/V for years.  Pt is now NPO and awaiting PICC line placement. TPN will be initiated. Pharmacy note pending.  Per patient, UBW is 175 lb. Per chart review, pt has lost 20 lb since February 2017 (11% wt loss x 11 months, insignificant for time frame). Nutrition-Focused physical exam completed. Findings are mild fat depletion, severe muscle depletion, and no edema. Pt meets criteria for malnutrition given poor intakes and severe muscle depletion. Pt is at refeeding risk given extended poor PO intake.  Medications: IV Mg sulfate once,  IV Reglan every 8 hours, IV Protonix daily, Florastor capsule BID, D5 and .45% NaCl w/ KCl infusion at 50 ml/hr -provides 204 kcal, IV Zofran PRN Labs reviewed: Low Mg  Diet Order:  Diet NPO time specified  Skin:  Wound (see comment) (1/22 abdominal incision)  Last BM:  1/30  Height:   Ht Readings from Last 1 Encounters:  10/28/16 5\' 3"  (1.6 m)    Weight:   Wt Readings from Last 1 Encounters:  10/28/16 155 lb (70.3 kg)    Ideal Body Weight:  52.3 kg  BMI:  Body mass index is 27.46 kg/m.  Estimated Nutritional Needs:   Kcal:  [id number]  Protein:  80-90g  Fluid:  1.8L/day  EDUCATION NEEDS:   Education needs addressed  Clayton Bibles, MS, RD, LDN Pager: [phone] After Hours Pager: [phone]

## 2016-11-08 NOTE — Progress Notes (Signed)
Patient ID: Melanie [name], female   DOB: 10/12/1935, 81 y.o.   MRN: [id number]  [facility] Surgery Progress Note  9 Days Post-Op  Subjective: Feeling better this morning. Denies any abdominal pain or nausea. NG tube is helping. Ambulated all the way down the hall yesterday. She did have another loose BM yesterday. Feels hungry.  NG output 1L since placement yesterday.  Objective: Vital signs in last 24 hours: Temp:  [97.8 F (36.6 C)-98.5 F (36.9 C)] 98.4 F (36.9 C) (01/31 0556) Pulse Rate:  [62-80] 80 (01/31 0556) Resp:  [12-16] 16 (01/31 0556) BP: (135-180)/(45-62) 135/45 (01/31 0556) SpO2:  [100 %] 100 % (01/31 0556) Last BM Date: 11/07/16  Intake/Output from previous day: 01/30 0701 - 01/31 0700 In: 1270 [P.O.:20; I.V.:1100; IV Piggyback:150] Out: 2450 [Urine:1300; Emesis/NG output:500; Stool:650] Intake/Output this shift: No intake/output data recorded.  PE: Gen: alert, NAD,pleasant Pulm: effort normal Abd: Soft, ND, +BS, nontender, incisions C/D/I, no hernias   Lab Results:   Recent Labs  11/07/16 0447 11/08/16 0447  WBC 18.5* 15.8*  HGB 9.6* 9.8*  HCT 28.3* 28.1*  PLT 307 356   BMET  Recent Labs  11/07/16 0447 11/08/16 0447  NA 137 139  K 3.3* 3.6  CL 106 107  CO2 22 23  GLUCOSE 105* 107*  BUN 11 10  CREATININE 0.66 0.57  CALCIUM 7.9* 8.0*   PT/INR No results for input(s): LABPROT, INR in the last 72 hours. CMP     Component Value Date/Time   NA 139 11/08/2016 0447   K 3.6 11/08/2016 0447   CL 107 11/08/2016 0447   CO2 23 11/08/2016 0447   GLUCOSE 107 (H) 11/08/2016 0447   BUN 10 11/08/2016 0447   CREATININE 0.57 11/08/2016 0447   CALCIUM 8.0 (L) 11/08/2016 0447   PROT 5.2 (L) 11/05/2016 0512   ALBUMIN 2.1 (L) 11/05/2016 0512   AST 23 11/05/2016 0512   ALT 31 11/05/2016 0512   ALKPHOS 164 (H) 11/05/2016 0512   BILITOT 0.7 11/05/2016 0512   GFRNONAA >60 11/08/2016 0447   GFRAA >60 11/08/2016 0447   Lipase     Component  Value Date/Time   LIPASE 26 11/05/2016 0512       Studies/Results: Dg Abd Portable 1v  Result Date: 11/07/2016 CLINICAL DATA:  Check nasogastric catheter placement EXAM: PORTABLE ABDOMEN - 1 VIEW COMPARISON:  Film from earlier in the same day FINDINGS: Nasogastric catheter is noted coiled within the stomach. Scattered large and small bowel gas is again identified with mild small bowel dilatation. No free air is seen. Postsurgical changes in the lumbar spine are noted. IMPRESSION: Nasogastric catheter within the stomach. Electronically Signed   By: Inez Catalina M.D.   On: 11/07/2016 15:27   Dg Abd Portable 1v  Result Date: 11/07/2016 CLINICAL DATA:  81 year old female with abdominal pain and vomiting. Postcholecystectomy 10/30/2016. Subsequent encounter. EXAM: PORTABLE ABDOMEN - 1 VIEW COMPARISON:  11/02/2016 abdominal CT. FINDINGS: Persistent gas distended small bowel loops measuring up to 4.2 cm. Upright view not obtained to evaluate for the possibly of free air. Gas-filled stomach. Question if patient would benefit from nasogastric tube decompression? Post cholecystectomy and lumbar spine surgery. Eggshell calcification right aspect of pelvis is noted to be in the right buttock region on CT scan and does not represent a gallstone. IMPRESSION: Persistent gas distended small bowel loops and gas-filled stomach which may indicate postoperative ileus given the clinical presentation. Small bowel obstruction is a secondary less likely consideration. Electronically Signed  By: Genia Del M.D.   On: 11/07/2016 10:17    Anti-infectives: Anti-infectives    Start     Dose/Rate Route Frequency Ordered Stop   11/04/16 0900  piperacillin-tazobactam (ZOSYN) IVPB 3.375 g     3.375 g 12.5 mL/hr over 240 Minutes Intravenous Every 8 hours 11/04/16 0833     10/30/16 1600  piperacillin-tazobactam (ZOSYN) IVPB 3.375 g  Status:  Discontinued     3.375 g 12.5 mL/hr over 240 Minutes Intravenous Every 8 hours  10/30/16 1539 11/01/16 1814   10/29/16 1200  piperacillin-tazobactam (ZOSYN) IVPB 3.375 g  Status:  Discontinued     3.375 g 12.5 mL/hr over 240 Minutes Intravenous Every 8 hours 10/29/16 0941 10/30/16 1539       Assessment/Plan Symptomatic cholelithiasis S/p lap chole with IOC 1/22 Dr. Hassell Done - POD 9 - IOC negative - LFT's WNL (1/28) - Patient has had normal postop CT, HIDA, CXR, c diff PCR, u/a - WBC trending down, 15.8 today; afebrile - NG placed 1/30, abdominal pain and nausea improved today  HTN COPD GERD- protonix H/o infected knee after TKA - takes daily amoxicillin  ID - zosyn 1/21>>1/24, zosyn 1/27>> FEN - IVF, NPO/NGT VTE - SCDs, heparin  Plan - leukocytosis improving. Abdominal pain and nausea improving with NG tube (NG output 1L since placement yesterday). Recommend continuing NG tube today. Continue PT/ambulation. Continue reglan. Once NG output decreases will clamp tube and give clears. Start PICC/TPN due to prolonged ileus with inadequate oral intake since surgery. Plan to repeat CT prior to discharge.   LOS: 11 days    [name] , [facility] Surgery 11/08/2016, 9:03 AM Pager: [phone] Consults: [phone] Mon-Fri 7:00 am-4:30 pm Sat-Sun 7:00 am-11:30 am

## 2016-11-09 DIAGNOSIS — E785 Hyperlipidemia, unspecified: Secondary | ICD-10-CM | POA: Diagnosis present

## 2016-11-09 DIAGNOSIS — I10 Essential (primary) hypertension: Secondary | ICD-10-CM | POA: Diagnosis present

## 2016-11-09 DIAGNOSIS — J449 Chronic obstructive pulmonary disease, unspecified: Secondary | ICD-10-CM | POA: Diagnosis present

## 2016-11-09 DIAGNOSIS — K810 Acute cholecystitis: Secondary | ICD-10-CM

## 2016-11-09 DIAGNOSIS — K219 Gastro-esophageal reflux disease without esophagitis: Secondary | ICD-10-CM

## 2016-11-09 LAB — COMPREHENSIVE METABOLIC PANEL
ALBUMIN: 2.1 g/dL — AB (ref 3.5–5.0)
ALK PHOS: 110 U/L (ref 38–126)
ALT: 16 U/L (ref 14–54)
ANION GAP: 8 (ref 5–15)
AST: 11 U/L — AB (ref 15–41)
BUN: 8 mg/dL (ref 6–20)
CALCIUM: 7.7 mg/dL — AB (ref 8.9–10.3)
CO2: 25 mmol/L (ref 22–32)
CREATININE: 0.58 mg/dL (ref 0.44–1.00)
Chloride: 104 mmol/L (ref 101–111)
GFR calc Af Amer: >60 mL/min (ref >60)
GFR calc non Af Amer: >60 mL/min (ref >60)
GLUCOSE: 123 mg/dL — AB (ref 65–99)
Potassium: 3.1 mmol/L — ABNORMAL LOW (ref 3.5–5.1)
SODIUM: 137 mmol/L (ref 135–145)
Total Bilirubin: 0.8 mg/dL (ref 0.3–1.2)
Total Protein: 5.5 g/dL — ABNORMAL LOW (ref 6.5–8.1)

## 2016-11-09 LAB — DIFFERENTIAL
Basophils Absolute: 0 10*3/uL (ref 0.0–0.1)
Basophils Relative: 0 %
Eosinophils Absolute: 0 10*3/uL (ref 0.0–0.7)
Eosinophils Relative: 0 %
LYMPHS PCT: 8 %
Lymphs Abs: 1 10*3/uL (ref 0.7–4.0)
Monocytes Absolute: 1.3 10*3/uL — ABNORMAL HIGH (ref 0.1–1.0)
Monocytes Relative: 11 %
NEUTROS ABS: 9.6 10*3/uL — AB (ref 1.7–7.7)
NEUTROS PCT: 81 %

## 2016-11-09 LAB — CBC
HCT: 24.7 % — ABNORMAL LOW (ref 36.0–46.0)
Hemoglobin: 8.6 g/dL — ABNORMAL LOW (ref 12.0–15.0)
MCH: 26.5 pg (ref 26.0–34.0)
MCHC: 34.8 g/dL (ref 30.0–36.0)
MCV: 76 fL — ABNORMAL LOW (ref 78.0–100.0)
PLATELETS: 437 10*3/uL — AB (ref 150–400)
RBC: 3.25 MIL/uL — AB (ref 3.87–5.11)
RDW: 14.5 % (ref 11.5–15.5)
WBC: 11.9 10*3/uL — AB (ref 4.0–10.5)

## 2016-11-09 LAB — TRIGLYCERIDES: Triglycerides: 85 mg/dL (ref <150)

## 2016-11-09 LAB — MAGNESIUM: Magnesium: 1.7 mg/dL (ref 1.7–2.4)

## 2016-11-09 LAB — GLUCOSE, CAPILLARY
GLUCOSE-CAPILLARY: 122 mg/dL — AB (ref 65–99)
GLUCOSE-CAPILLARY: 141 mg/dL — AB (ref 65–99)
Glucose-Capillary: 135 mg/dL — ABNORMAL HIGH (ref 65–99)

## 2016-11-09 LAB — PREALBUMIN

## 2016-11-09 LAB — PHOSPHORUS: Phosphorus: 2.8 mg/dL (ref 2.5–4.6)

## 2016-11-09 MED ORDER — PHENOL 1.4 % MT LIQD
1.0000 | OROMUCOSAL | Status: DC | PRN
  Administered 2016-11-20 – 2016-11-27 (×3): 1 via OROMUCOSAL
  Filled 2016-11-09: qty 177

## 2016-11-09 MED ORDER — MAGNESIUM SULFATE IN D5W 1-5 GM/100ML-% IV SOLN
1.0000 g | Freq: Once | INTRAVENOUS | Status: AC
  Administered 2016-11-09: 1 g via INTRAVENOUS
  Filled 2016-11-09: qty 100

## 2016-11-09 MED ORDER — POTASSIUM CHLORIDE 10 MEQ/100ML IV SOLN
10.0000 meq | INTRAVENOUS | Status: AC
  Administered 2016-11-09 (×4): 10 meq via INTRAVENOUS
  Filled 2016-11-09 (×6): qty 100

## 2016-11-09 MED ORDER — FAT EMULSION 20 % IV EMUL
240.0000 mL | INTRAVENOUS | Status: AC
  Administered 2016-11-09: 240 mL via INTRAVENOUS
  Filled 2016-11-09: qty 250

## 2016-11-09 MED ORDER — M.V.I. ADULT IV INJ
INTRAVENOUS | Status: AC
  Administered 2016-11-09: 18:00:00 via INTRAVENOUS
  Filled 2016-11-09: qty 1200

## 2016-11-09 MED ORDER — POTASSIUM CHLORIDE 10 MEQ/100ML IV SOLN
10.0000 meq | INTRAVENOUS | Status: AC
  Administered 2016-11-09 (×2): 10 meq via INTRAVENOUS
  Filled 2016-11-09 (×2): qty 100

## 2016-11-09 NOTE — Progress Notes (Signed)
Physical Therapy Treatment Patient Details Name: Melanie [name] MRN: [id number] DOB: 02/01/1936 Today's Date: 11/09/2016    History of Present Illness 81 y.o. female with medical history significant of gallstones presents to the hospital with the chief complaint of abdominal pain. s/p laparoscopic cholecystectomy 10/30/16.    PT Comments    Pt feeling "better" with less ABD pain then days past.  Present with NG tube.  Assisted with amb in hallway then to bathroom to void.  Reported to NT.   Follow Up Recommendations  Home health PT     Equipment Recommendations  Rolling walker with 5" wheels;Other (comment) (shower seat)    Recommendations for Other Services       Precautions / Restrictions Precautions Precaution Comments: NG tube Restrictions Weight Bearing Restrictions: No    Mobility  Bed Mobility               General bed mobility comments: OOB in recliner  Transfers Overall transfer level: Needs assistance Equipment used: Rolling walker (2 wheeled) Transfers: Sit to/from Stand Sit to Stand: Min guard;Supervision         General transfer comment: 25% VC's on safety with turns in bathroom  Ambulation/Gait Ambulation/Gait assistance: Supervision;Min guard Ambulation Distance (Feet): 185 Feet Assistive device: Rolling walker (2 wheeled) Gait Pattern/deviations: Step-to pattern;Step-through pattern;Trunk flexed     General Gait Details: tolerated an increased distance   Science writer    Modified Rankin (Stroke Patients Only)       Balance                                    Cognition Arousal/Alertness: Awake/alert Behavior During Therapy: WFL for tasks assessed/performed Overall Cognitive Status: Within Functional Limits for tasks assessed                      Exercises      General Comments        Pertinent Vitals/Pain Faces Pain Scale: Hurts little more Pain Location: abdomen but  "not as bad" Pain Descriptors / Indicators: Cramping Pain Intervention(s): Monitored during session    Home Living                      Prior Function            PT Goals (current goals can now be found in the care plan section) Progress towards PT goals: Progressing toward goals    Frequency    Min 3X/week      PT Plan Current plan remains appropriate    Co-evaluation             End of Session Equipment Utilized During Treatment: Gait belt Activity Tolerance: Patient tolerated treatment well Patient left: with call bell/phone within reach;with family/visitor present;in chair     Time: [id number] PT Time Calculation (min) (ACUTE ONLY): 19 min  Charges:  $Gait Training: 8-22 mins                    G Codes:      Rica Koyanagi  PTA [facility]  Acute  Rehab Pager      [phone]

## 2016-11-09 NOTE — Progress Notes (Signed)
PHARMACY - ADULT TOTAL PARENTERAL NUTRITION CONSULT NOTE   Pharmacy Consult for TPN Indication: prolonged ileus  Patient Measurements: Height: 5\' 3"  (160 cm) Weight: 163 lb 11.2 oz (74.3 kg) (standing scale) IBW/kg (Calculated) : 52.4 TPN AdjBW (KG): 56.9 Body mass index is 29 kg/m. Usual Weight:   Insulin Requirements: no hx DM; 3 units from sensitive SSI q6h since TPN started yesterday  Current Nutrition: ensure bid started on 1/25 (last dose received was on 1/26); boost TID started on 1/29 (last dose received on 1/29) - now NPO - TPN started on 1/31  IVF: none  Central access: PICC TPN start date: 11/08/16  ASSESSMENT                                                                                                          HPI: Patient is [name] with hx of gallstones presented to the ED on 1/20 with c/o abdominal pain. MRCP on 1/22 showed distended gallbladder with tiny gallstones and wall thickening with suspicion for acute cholecystitis.  Patient underwent lap cholecystectomy with intraoperative cholangiogram on 1/22.  To start TPN for prolonged ileus and poor oral intake.  Significant events:  1/22: lap chole with cholangiogram 1/30: abd KUB with suspicion for post-op ileus; NG tube placed  Today:    Glucose (goal <150): all wnl except for one reading of 153  Electrolytes: K+ low at 3.1, Mag improved to 1.7 s/p replacement on 1/31; CorrCa wnl, phos improved to 2.8 s/p repletion  Renal: scr ok (crcl~54)  LFTs: wnl  TGs: pending  Prealbumin: pending  NUTRITIONAL GOALS                                                                                             RD recs: Kcal:  1750-1950 Protein:  80-90g Fluid:  1.8L/day  Clinimix E 5/20 at a goal rate of 70 ml/hr + 20% fat emulsion at 20 ml/hr to provide: 84 g/day protein, 1958 Kcal/day.  (+++There is currently a Psychologist, prison and probation services of Clinimix solution. Pharmacy will use Clinimix product based on what we  have available in stock+++)  PLAN  Now: - Magnesium 1 gm - potassium chloride 10 meq IV x6 runs  At 1800 today:  Increase Clinimix E 5/20  Rate to 50 ml/hr.  20% fat emulsion at 20 ml/hr.  Plan to advance as tolerated to the goal rate.  Trace elements are on Producer, television/film/video. As per current [place] standard, trace elements will be provided every other day, multivitamins will be added daily.        No trace elements inTPN bag today (added on 1/31)  Continue sensitive SSI q6h   TPN lab panels on Mondays & Thursdays.  F/u daily.  Patient is at risk for refeeding syndrome -- will monitor electrolytes closely   Jari Carollo P 11/09/2016,7:45 AM

## 2016-11-09 NOTE — Progress Notes (Signed)
PROGRESS NOTE   Melanie [name]  [id number]  DOB: [birth year]  DOA: 10/28/2016 PCP: Osborne Casco, MD  Hospital course:  Melanie [name] is a 81 y.o. female with medical history significant of gallstones presents to the hospital with the chief complaint of abdominal pain.  she had been experiencing abdominal pain for 4 weeks prior to this admission, had been diagnosed with gallstones and was scheduled to see general surgery this week. However due to worsening of her symptoms, she presented to the ED on 10/28/16. General surgery was consulted. She was assessed to have symptomatic cholelithiasis and choledocholithiasis. MRCP was negative for obstruction. She underwent laparoscopic cholecystectomy with IOC on 10/30/16 which showed necrotic gallbladder with free intraperitoneal bile, enlarged CBD with free flow into the duodenum. Slow postop progress. General surgery continues to follow.  Assessment & Plan:   1. Acute cholecystitis and Biliary colic -  s/p lap chole and negative IOC on 1/22.  Very slow and complicated postop progress. Ongoing issues with abdominal pain, diarrhea, poor oral intake and worsening leukocytosis. Infectious workup was initiated 1/26: Chest x-ray negative, CT abdomen and pelvis with contrast 11/02/16 without acute findings, urine microscopy not suggestive of UTI, C. difficile testing negative. Zosyn that had been stopped on 1/24 was reinitiated by surgery on 1/27. HIDA scan ruled out biliary leak. Transiently elevated LFTs have improved. Normal lipase. Continue Zosyn. On 1/30, worsened abdominal pain >postop ileus on abdominal x-ray. Treating with NG tube decompression and IV fluids. Improving. Started PICC line and TPN on 1/31. As per surgery follow-up, if continues to have high NG output, considering repeat abdominal CT to rule out abscess.   2. Hypertension. Exacerbated by pain, Hydralazine IV ordered as needed. Started Amlodipine and Bystolic per home dose. Hold home  clonidine and HCTZ for now. Uncontrolled. Increase amlodipine to 10 MG daily. Monitor. Better.  3. COPD. Stable with no exacerbation.  4. GERD. Continue omeprazole.  5.  Hypokalemia/hypomagnesemia - pharmacy replacing electrolytes via TPN.  6. AKI - resolved after IV fluid hydration.  7. Anemia: Hemoglobin has slightly dropped from 9 range to 8.6 in the absence of bleeding. Follow CBC in a.m.  8: Leukocytosis: As per problem #1. Improving  9. History of septic arthritis/right knee: Patient states that she had MRSA however has been on long-term amoxicillin prior to this admission. Plan to discuss with her infectious disease M.D. in [place] (Dr. Melynda Keller). Has been on Zosyn almost every day since admission except 2 days in between. Left message for her infectious disease physician to call me back >have not heard back yet.  10. Diarrhea: Unclear etiology. C. difficile negative. Added probiotics. Decreasing.   DVT prophylaxis: heparin Code Status: Full  Family Communication:  None at bedside Disposition Plan: Home when medically stable.   Subjective: Still has mild intermittent upper abdominal pain but much improved compared to 48 hours ago. Diarrhea improved-states that the quantity is small and frequency has decreased and is more formed.  Objective: Vitals:   11/08/16 1457 11/08/16 2151 11/09/16 0055 11/09/16 1003  BP: 138/66 (!) 161/49 (!) 136/44 (!) 154/47  Pulse: 77 81 80 92  Resp: 16 16 16 17   Temp: 98.6 F (37 C) 98.1 F (36.7 C) 98.4 F (36.9 C) 97.8 F (36.6 C)  TempSrc: Oral Oral Oral Oral  SpO2: 100% 100% 100% 97%  Weight:      Height:        Intake/Output Summary (Last 24 hours) at 11/09/16 1437 Last data filed at  11/09/16 1146  Gross per 24 hour  Intake              883 ml  Output             2650 ml  Net            -1767 ml   Filed Weights   10/28/16 0551 11/08/16 1347  Weight: 70.3 kg (155 lb) 74.3 kg (163 lb 11.2 oz)   Exam:  General  appearance: Pleasant elderly female, moderately built, chronically ill-looking, sitting up comfortably in chair this morning. Resp: Clear to auscultation.No increased work of breathing. CVS: S1 and S2 heard, RRR. No JVD, murmurs or pedal edema. GI: Improved abdominal distention. NG tube draining bilious material. Mild epigastric tenderness without rigidity, guarding or rebound. Few bowel sounds heard. Central nervous system: Alert and oriented. No focal neurological deficits. Extremities: no CCE.  Data Reviewed: Basic Metabolic Panel:  Recent Labs Lab 11/04/16 0522 11/05/16 0512 11/07/16 0447 11/08/16 0447 11/09/16 0447  NA 138 136 137 139 137  K 4.1 3.6 3.3* 3.6 3.1*  CL 110 108 106 107 104  CO2 22 21* 22 23 25   GLUCOSE 119* 105* 105* 107* 123*  BUN 18 16 11 10 8   CREATININE 0.63 0.63 0.66 0.57 0.58  CALCIUM 8.4* 8.1* 7.9* 8.0* 7.7*  MG  --   --   --  1.2* 1.7  PHOS  --   --   --  2.6 2.8   Liver Function Tests:  Recent Labs Lab 11/03/16 0506 11/05/16 0512 11/09/16 0447  AST 30 23 11*  ALT 50 31 16  ALKPHOS 171* 164* 110  BILITOT 1.2 0.7 0.8  PROT 6.0* 5.2* 5.5*  ALBUMIN 2.3* 2.1* 2.1*    Recent Labs Lab 11/05/16 0512  LIPASE 26   No results for input(s): AMMONIA in the last 168 hours. CBC:  Recent Labs Lab 11/05/16 0514 11/06/16 0423 11/07/16 0447 11/08/16 0447 11/09/16 0447  WBC 23.9* 21.9* 18.5* 15.8* 11.9*  NEUTROABS  --   --   --   --  9.6*  HGB 10.1* 9.5* 9.6* 9.8* 8.6*  HCT 29.0* 28.3* 28.3* 28.1* 24.7*  MCV 76.3* 75.7* 76.1* 75.9* 76.0*  PLT 193 230 307 356 437*   Cardiac Enzymes: No results for input(s): CKTOTAL, CKMB, CKMBINDEX, TROPONINI in the last 168 hours. CBG (last 3)   Recent Labs  11/08/16 2345 11/09/16 0538 11/09/16 1202  GLUCAP 153* 122* 135*   Recent Results (from the past 240 hour(s))  C difficile quick scan w PCR reflex     Status: None   Collection Time: 11/04/16  6:41 AM  Result Value Ref Range Status   C Diff  antigen NEGATIVE NEGATIVE Final   C Diff toxin NEGATIVE NEGATIVE Final   C Diff interpretation No C. difficile detected.  Final     Studies: Dg Abd Portable 1v  Result Date: 11/07/2016 CLINICAL DATA:  Check nasogastric catheter placement EXAM: PORTABLE ABDOMEN - 1 VIEW COMPARISON:  Film from earlier in the same day FINDINGS: Nasogastric catheter is noted coiled within the stomach. Scattered large and small bowel gas is again identified with mild small bowel dilatation. No free air is seen. Postsurgical changes in the lumbar spine are noted. IMPRESSION: Nasogastric catheter within the stomach. Electronically Signed   By: Inez Catalina M.D.   On: 11/07/2016 15:27   Scheduled Meds: . amLODipine  10 mg Oral Daily  . heparin subcutaneous  5,000 Units Subcutaneous Q8H  .  insulin aspart  0-9 Units Subcutaneous Q6H  . metoCLOPramide (REGLAN) injection  5 mg Intravenous Q8H  . nebivolol  5 mg Oral Daily  . pantoprazole (PROTONIX) IV  40 mg Intravenous Q24H  . piperacillin-tazobactam (ZOSYN)  IV  3.375 g Intravenous Q8H  . saccharomyces boulardii  250 mg Oral BID   Continuous Infusions: . Marland KitchenTPN (CLINIMIX-E) Adult     And  . fat emulsion    . Marland KitchenTPN (CLINIMIX-E) Adult 40 mL/hr at 11/08/16 1717    Principal Problem:   Necrotic cholecystitis s/p lap cholecystectomy 10/30/2016 Active Problems:   Hypertension   Hyperlipidemia   COPD (chronic obstructive pulmonary disease) (HCC)   GERD (gastroesophageal reflux disease)  Jodi Criscuolo, MD, FACP, FHM. [facility] Pager [phone]  If 7PM-7AM, please contact night-coverage [email or web address] Password [email or web address] 11/09/2016, 2:37 PM   LOS: 12 days

## 2016-11-09 NOTE — Progress Notes (Signed)
Patient ID: Melanie [name], female   DOB: 1936/01/20, 81 y.o.   MRN: [id number]  [facility] Surgery Progress Note  10 Days Post-Op  Subjective: Feeling well this morning. Denies any current abdominal pain, nausea, or vomiting. She does not like the NG tube, but feels that it is helping. Sat on side of bed yesterday but did not ambulate. BM yesterday.  Objective: Vital signs in last 24 hours: Temp:  [98.1 F (36.7 C)-98.6 F (37 C)] 98.4 F (36.9 C) (02/01 0055) Pulse Rate:  [77-81] 80 (02/01 0055) Resp:  [16] 16 (02/01 0055) BP: (132-161)/(44-78) 136/44 (02/01 0055) SpO2:  [100 %] 100 % (02/01 0055) Weight:  [163 lb 11.2 oz (74.3 kg)] 163 lb 11.2 oz (74.3 kg) (01/31 1347) Last BM Date: 11/07/16  Intake/Output from previous day: 01/31 0701 - 02/01 0700 In: 863 [I.V.:763; IV Piggyback:100] Out: 2150 [Urine:800; Emesis/NG output:1350] Intake/Output this shift: No intake/output data recorded.  PE: Gen: alert, NAD,pleasant Pulm: effort normal Abd: Soft, mild distension, +BS, nontender, incisions C/D/I, no hernias   Lab Results:   Recent Labs  11/08/16 0447 11/09/16 0447  WBC 15.8* 11.9*  HGB 9.8* 8.6*  HCT 28.1* 24.7*  PLT 356 437*   BMET  Recent Labs  11/08/16 0447 11/09/16 0447  NA 139 137  K 3.6 3.1*  CL 107 104  CO2 23 25  GLUCOSE 107* 123*  BUN 10 8  CREATININE 0.57 0.58  CALCIUM 8.0* 7.7*   PT/INR No results for input(s): LABPROT, INR in the last 72 hours. CMP     Component Value Date/Time   NA 137 11/09/2016 0447   K 3.1 (L) 11/09/2016 0447   CL 104 11/09/2016 0447   CO2 25 11/09/2016 0447   GLUCOSE 123 (H) 11/09/2016 0447   BUN 8 11/09/2016 0447   CREATININE 0.58 11/09/2016 0447   CALCIUM 7.7 (L) 11/09/2016 0447   PROT 5.5 (L) 11/09/2016 0447   ALBUMIN 2.1 (L) 11/09/2016 0447   AST 11 (L) 11/09/2016 0447   ALT 16 11/09/2016 0447   ALKPHOS 110 11/09/2016 0447   BILITOT 0.8 11/09/2016 0447   GFRNONAA >60 11/09/2016 0447   GFRAA >60  11/09/2016 0447   Lipase     Component Value Date/Time   LIPASE 26 11/05/2016 0512       Studies/Results: Dg Abd Portable 1v  Result Date: 11/07/2016 CLINICAL DATA:  Check nasogastric catheter placement EXAM: PORTABLE ABDOMEN - 1 VIEW COMPARISON:  Film from earlier in the same day FINDINGS: Nasogastric catheter is noted coiled within the stomach. Scattered large and small bowel gas is again identified with mild small bowel dilatation. No free air is seen. Postsurgical changes in the lumbar spine are noted. IMPRESSION: Nasogastric catheter within the stomach. Electronically Signed   By: Inez Catalina M.D.   On: 11/07/2016 15:27   Dg Abd Portable 1v  Result Date: 11/07/2016 CLINICAL DATA:  81 year old female with abdominal pain and vomiting. Postcholecystectomy 10/30/2016. Subsequent encounter. EXAM: PORTABLE ABDOMEN - 1 VIEW COMPARISON:  11/02/2016 abdominal CT. FINDINGS: Persistent gas distended small bowel loops measuring up to 4.2 cm. Upright view not obtained to evaluate for the possibly of free air. Gas-filled stomach. Question if patient would benefit from nasogastric tube decompression? Post cholecystectomy and lumbar spine surgery. Eggshell calcification right aspect of pelvis is noted to be in the right buttock region on CT scan and does not represent a gallstone. IMPRESSION: Persistent gas distended small bowel loops and gas-filled stomach which may indicate postoperative ileus  given the clinical presentation. Small bowel obstruction is a secondary less likely consideration. Electronically Signed   By: Genia Del M.D.   On: 11/07/2016 10:17    Anti-infectives: Anti-infectives    Start     Dose/Rate Route Frequency Ordered Stop   11/04/16 0900  piperacillin-tazobactam (ZOSYN) IVPB 3.375 g     3.375 g 12.5 mL/hr over 240 Minutes Intravenous Every 8 hours 11/04/16 0833     10/30/16 1600  piperacillin-tazobactam (ZOSYN) IVPB 3.375 g  Status:  Discontinued     3.375 g 12.5 mL/hr  over 240 Minutes Intravenous Every 8 hours 10/30/16 1539 11/01/16 1814   10/29/16 1200  piperacillin-tazobactam (ZOSYN) IVPB 3.375 g  Status:  Discontinued     3.375 g 12.5 mL/hr over 240 Minutes Intravenous Every 8 hours 10/29/16 0941 10/30/16 1539       Assessment/Plan Symptomatic cholelithiasis S/p lap chole with IOC 1/22 Dr. Hassell Done - POD 10 - IOC negative - LFT's WNL  - Patient has had normal postop CT, HIDA, CXR, c diff PCR, u/a - WBC trending down, 11.9 today; afebrile - NG with 1350cc/24hr  HTN COPD GERD- protonix H/o infected knee after TKA - takes daily amoxicillin  ID - zosyn 1/21>>1/24, zosyn 1/27>> FEN - IVF, NPO/NGT, TPN, prealbumin pending VTE - SCDs, heparin  Plan - Abdominal pain improving and WBC continues to decrease. Continue NG tube, reglan, and TPN. Encourage PT/ambulation.  Plan to repeat CT prior to discharge.    LOS: 12 days    [name] , [facility] Surgery 11/09/2016, 8:29 AM Pager: [phone] Consults: [phone] Mon-Fri 7:00 am-4:30 pm Sat-Sun 7:00 am-11:30 am

## 2016-11-10 ENCOUNTER — Inpatient Hospital Stay (HOSPITAL_COMMUNITY): Payer: Medicare Other

## 2016-11-10 ENCOUNTER — Encounter (HOSPITAL_COMMUNITY): Payer: Self-pay | Admitting: Radiology

## 2016-11-10 LAB — CBC
HEMATOCRIT: 23.6 % — AB (ref 36.0–46.0)
HEMOGLOBIN: 8.2 g/dL — AB (ref 12.0–15.0)
MCH: 26.8 pg (ref 26.0–34.0)
MCHC: 34.7 g/dL (ref 30.0–36.0)
MCV: 77.1 fL — AB (ref 78.0–100.0)
Platelets: 463 10*3/uL — ABNORMAL HIGH (ref 150–400)
RBC: 3.06 MIL/uL — ABNORMAL LOW (ref 3.87–5.11)
RDW: 14.9 % (ref 11.5–15.5)
WBC: 10.1 10*3/uL (ref 4.0–10.5)

## 2016-11-10 LAB — COMPREHENSIVE METABOLIC PANEL
ALBUMIN: 2 g/dL — AB (ref 3.5–5.0)
ALK PHOS: 93 U/L (ref 38–126)
ALT: 13 U/L — AB (ref 14–54)
AST: 11 U/L — AB (ref 15–41)
Anion gap: 7 (ref 5–15)
BUN: 9 mg/dL (ref 6–20)
CHLORIDE: 104 mmol/L (ref 101–111)
CO2: 26 mmol/L (ref 22–32)
CREATININE: 0.56 mg/dL (ref 0.44–1.00)
Calcium: 7.6 mg/dL — ABNORMAL LOW (ref 8.9–10.3)
GFR calc non Af Amer: >60 mL/min (ref >60)
GLUCOSE: 148 mg/dL — AB (ref 65–99)
Potassium: 3.4 mmol/L — ABNORMAL LOW (ref 3.5–5.1)
SODIUM: 137 mmol/L (ref 135–145)
Total Bilirubin: 0.6 mg/dL (ref 0.3–1.2)
Total Protein: 5.5 g/dL — ABNORMAL LOW (ref 6.5–8.1)

## 2016-11-10 LAB — GLUCOSE, CAPILLARY
GLUCOSE-CAPILLARY: 133 mg/dL — AB (ref 65–99)
GLUCOSE-CAPILLARY: 153 mg/dL — AB (ref 65–99)
GLUCOSE-CAPILLARY: 158 mg/dL — AB (ref 65–99)
Glucose-Capillary: 135 mg/dL — ABNORMAL HIGH (ref 65–99)

## 2016-11-10 LAB — PHOSPHORUS: PHOSPHORUS: 2.6 mg/dL (ref 2.5–4.6)

## 2016-11-10 LAB — MAGNESIUM: Magnesium: 1.6 mg/dL — ABNORMAL LOW (ref 1.7–2.4)

## 2016-11-10 MED ORDER — TRACE MINERALS CR-CU-MN-SE-ZN 10-1000-500-60 MCG/ML IV SOLN
INTRAVENOUS | Status: AC
  Administered 2016-11-10: 18:00:00 via INTRAVENOUS
  Filled 2016-11-10: qty 1680

## 2016-11-10 MED ORDER — MAGNESIUM SULFATE 4 GM/100ML IV SOLN
4.0000 g | Freq: Once | INTRAVENOUS | Status: AC
  Administered 2016-11-10: 4 g via INTRAVENOUS
  Filled 2016-11-10: qty 100

## 2016-11-10 MED ORDER — MAGNESIUM SULFATE 2 GM/50ML IV SOLN
2.0000 g | Freq: Once | INTRAVENOUS | Status: DC
  Filled 2016-11-10: qty 50

## 2016-11-10 MED ORDER — IOPAMIDOL (ISOVUE-300) INJECTION 61%
100.0000 mL | Freq: Once | INTRAVENOUS | Status: AC | PRN
  Administered 2016-11-10: 100 mL via INTRAVENOUS

## 2016-11-10 MED ORDER — FAT EMULSION 20 % IV EMUL
240.0000 mL | INTRAVENOUS | Status: AC
  Administered 2016-11-10: 240 mL via INTRAVENOUS
  Filled 2016-11-10: qty 250

## 2016-11-10 MED ORDER — IOPAMIDOL (ISOVUE-300) INJECTION 61%
INTRAVENOUS | Status: AC
  Administered 2016-11-10: 15 mL via ORAL
  Filled 2016-11-10: qty 30

## 2016-11-10 MED ORDER — IOPAMIDOL (ISOVUE-300) INJECTION 61%
INTRAVENOUS | Status: AC
  Filled 2016-11-10: qty 100

## 2016-11-10 MED ORDER — SODIUM CHLORIDE 0.9 % IV SOLN
30.0000 meq | Freq: Once | INTRAVENOUS | Status: AC
  Administered 2016-11-10: 30 meq via INTRAVENOUS
  Filled 2016-11-10: qty 15

## 2016-11-10 MED ORDER — IOPAMIDOL (ISOVUE-300) INJECTION 61%
15.0000 mL | Freq: Two times a day (BID) | INTRAVENOUS | Status: DC | PRN
  Administered 2016-11-10: 15 mL via ORAL
  Filled 2016-11-10: qty 30

## 2016-11-10 MED ORDER — DEXTROSE 5 % IV SOLN
10.0000 mmol | Freq: Once | INTRAVENOUS | Status: AC
  Administered 2016-11-10: 10 mmol via INTRAVENOUS
  Filled 2016-11-10: qty 3.33

## 2016-11-10 NOTE — Progress Notes (Signed)
Odor coming from patient's NGT smells like stool.  It has yellow appearance with sediment.

## 2016-11-10 NOTE — Progress Notes (Signed)
PROGRESS NOTE   Melanie [name]  [id number]  DOB: Mar 08, 1936  DOA: 10/28/2016 PCP: Osborne Casco, MD  Hospital course:  Melanie [name] is a 81 y.o. female with medical history significant of gallstones presents to the hospital with the chief complaint of abdominal pain.  she had been experiencing abdominal pain for 4 weeks prior to this admission, had been diagnosed with gallstones and was scheduled to see general surgery this week. However due to worsening of her symptoms, she presented to the ED on 10/28/16. General surgery was consulted. She was assessed to have symptomatic cholelithiasis and choledocholithiasis. MRCP was negative for obstruction. She underwent laparoscopic cholecystectomy with IOC on 10/30/16 which showed necrotic gallbladder with free intraperitoneal bile, enlarged CBD with free flow into the duodenum. Slow postop progress. General surgery continues to follow. After discussing with surgeon on 11/10/16, transferred care over to [place]. TRH will sign off. Please reconsult for any further assistance.  Assessment & Plan:   1. Acute cholecystitis and Biliary colic -  s/p lap chole and negative IOC on 1/22.  Very slow and complicated postop progress. Ongoing issues with abdominal pain, diarrhea, poor oral intake and worsening leukocytosis. Infectious workup was initiated 1/26: Chest x-ray negative, CT abdomen and pelvis with contrast 11/02/16 without acute findings, urine microscopy not suggestive of UTI, C. difficile testing negative. Zosyn that had been stopped on 1/24 was reinitiated by surgery on 1/27. HIDA scan ruled out biliary leak. Transiently elevated LFTs have improved. Normal lipase. Continue Zosyn. On 1/30, worsened abdominal pain >postop ileus on abdominal x-ray. Treating with NG tube decompression and IV fluids. Improving. Started PICC line and TPN on 1/31. Abdominal pain decreased but present, reports chest discomfort related to NG tube, still having high NG tube output.  Surgery getting CT abdomen to further evaluate. Discussed with rounding surgeon on 11/10/16 who agreed to take over care on to their primary service since primary issue is postsurgical. TRH will sign off and to be consulted again for further assistance.  2. Hypertension. Exacerbated by pain, Hydralazine IV ordered as needed. Started Amlodipine and Bystolic per home dose. Hold home clonidine and HCTZ for now. Uncontrolled. Increased amlodipine to 10 MG daily. Monitor. Better.  3. COPD. Stable with no exacerbation.  4. GERD. Continue IV Protonix  5.  Hypokalemia/hypomagnesemia - pharmacy replacing electrolytes via TPN.  6. AKI - resolved after IV fluid hydration.  7. Anemia: Hemoglobin has slightly dropped from 9 range to 8.6 in the absence of bleeding. Follow CBC in a.m. Transfuse if hemoglobin <7 g per DL.  8: Leukocytosis: As per problem #1. Resolved.  9. History of septic arthritis/right knee: Patient states that she had MRSA however has been on long-term amoxicillin prior to this admission. Plan to discuss with her infectious disease M.D. in [place] (Dr. Melynda Keller). Has been on Zosyn almost every day since admission except 2 days in between. Left message on multiple days for her infectious disease physician to call me back >have not heard back yet. CCS to follow up (discussed with CCS)  10. Diarrhea: Unclear etiology. C. difficile negative. Added probiotics. Decreasing.   DVT prophylaxis: heparin Code Status: Full  Family Communication:  discussed with son at bedside. Disposition Plan: Home when medically stable.   Subjective: Persisting mild epigastric abdominal pain. Reported chest discomfort due to NG tube to CCS but not to me. Diarrhea improving.  Objective: Vitals:   11/09/16 1422 11/10/16 0022 11/10/16 0620 11/10/16 0930  BP:  (!) 140/45 (!) 143/45 Marland Kitchen)  146/51  Pulse: 100 88 88 81  Resp: 17 18 16 16   Temp: 98.1 F (36.7 C) 98.8 F (37.1 C) 99.1 F (37.3 C)     TempSrc: Oral Oral Oral   SpO2: 95% 98% 99% 98%  Weight:      Height:        Intake/Output Summary (Last 24 hours) at 11/10/16 1249 Last data filed at 11/10/16 1200  Gross per 24 hour  Intake           639.82 ml  Output             2400 ml  Net         -1760.18 ml   Filed Weights   10/28/16 0551 11/08/16 1347  Weight: 70.3 kg (155 lb) 74.3 kg (163 lb 11.2 oz)   Exam:  General appearance: Pleasant elderly female, moderately built, chronically ill-looking, sitting up comfortably in bed this morning. Resp: Clear to auscultation.No increased work of breathing. CVS: S1 and S2 heard, RRR. No JVD, murmurs or pedal edema. GI: Improved abdominal distention. NG tube draining bilious material. Mild epigastric tenderness without rigidity, guarding or rebound. Few bowel sounds heard. Central nervous system: Alert and oriented. No focal neurological deficits. Extremities: no CCE.  Data Reviewed: Basic Metabolic Panel:  Recent Labs Lab 11/05/16 0512 11/07/16 0447 11/08/16 0447 11/09/16 0447 11/10/16 0430  NA 136 137 139 137 137  K 3.6 3.3* 3.6 3.1* 3.4*  CL 108 106 107 104 104  CO2 21* 22 23 25 26   GLUCOSE 105* 105* 107* 123* 148*  BUN 16 11 10 8 9   CREATININE 0.63 0.66 0.57 0.58 0.56  CALCIUM 8.1* 7.9* 8.0* 7.7* 7.6*  MG  --   --  1.2* 1.7 1.6*  PHOS  --   --  2.6 2.8 2.6   Liver Function Tests:  Recent Labs Lab 11/05/16 0512 11/09/16 0447 11/10/16 0430  AST 23 11* 11*  ALT 31 16 13*  ALKPHOS 164* 110 93  BILITOT 0.7 0.8 0.6  PROT 5.2* 5.5* 5.5*  ALBUMIN 2.1* 2.1* 2.0*    Recent Labs Lab 11/05/16 0512  LIPASE 26   No results for input(s): AMMONIA in the last 168 hours. CBC:  Recent Labs Lab 11/06/16 0423 11/07/16 0447 11/08/16 0447 11/09/16 0447 11/10/16 0430  WBC 21.9* 18.5* 15.8* 11.9* 10.1  NEUTROABS  --   --   --  9.6*  --   HGB 9.5* 9.6* 9.8* 8.6* 8.2*  HCT 28.3* 28.3* 28.1* 24.7* 23.6*  MCV 75.7* 76.1* 75.9* 76.0* 77.1*  PLT 230 307 356 437*  463*   Cardiac Enzymes: No results for input(s): CKTOTAL, CKMB, CKMBINDEX, TROPONINI in the last 168 hours. CBG (last 3)   Recent Labs  11/10/16 0012 11/10/16 0614 11/10/16 1203  GLUCAP 135* 158* 133*   Recent Results (from the past 240 hour(s))  C difficile quick scan w PCR reflex     Status: None   Collection Time: 11/04/16  6:41 AM  Result Value Ref Range Status   C Diff antigen NEGATIVE NEGATIVE Final   C Diff toxin NEGATIVE NEGATIVE Final   C Diff interpretation No C. difficile detected.  Final     Studies: No results found. Scheduled Meds: . amLODipine  10 mg Oral Daily  . heparin subcutaneous  5,000 Units Subcutaneous Q8H  . insulin aspart  0-9 Units Subcutaneous Q6H  . metoCLOPramide (REGLAN) injection  5 mg Intravenous Q8H  . nebivolol  5 mg Oral Daily  .  pantoprazole (PROTONIX) IV  40 mg Intravenous Q24H  . piperacillin-tazobactam (ZOSYN)  IV  3.375 g Intravenous Q8H  . potassium phosphate IVPB (mmol)  10 mmol Intravenous Once  . saccharomyces boulardii  250 mg Oral BID   Continuous Infusions: . Marland KitchenTPN (CLINIMIX-E) Adult 50 mL/hr at 11/09/16 1734  . Marland KitchenTPN (CLINIMIX-E) Adult     And  . fat emulsion      Principal Problem:   Necrotic cholecystitis s/p lap cholecystectomy 10/30/2016 Active Problems:   Hypertension   Hyperlipidemia   COPD (chronic obstructive pulmonary disease) (HCC)   GERD (gastroesophageal reflux disease)  Yancey Pedley, MD, FACP, FHM. [facility] Pager [phone]  If 7PM-7AM, please contact night-coverage [email or web address] Password [email or web address] 11/10/2016, 12:49 PM   LOS: 13 days

## 2016-11-10 NOTE — Progress Notes (Signed)
Nutrition Follow-up  DOCUMENTATION CODES:   Severe malnutrition in context of acute illness/injury  INTERVENTION:   TPN per Pharmacy: Currently   At 1800 today:  Increase Clinimix E 5/20 to goal rate of 70 ml/hr.  20% fat emulsion at 20 ml/hr over 12 hrs  Trace elements are on Producer, television/film/video. As per current [place] standard, trace elements will be provided every other day, multivitamins will be added daily.        Add trace elements inTPN bag today   Continue sensitive SSI q6h   TPN lab panels on Mondays & Thursdays.  F/u daily.  Patient is at risk for refeeding syndrome -- will monitor electrolytes closely (daily labs ordered through 2/5)  NUTRITION DIAGNOSIS:   Malnutrition related to acute illness as evidenced by energy intake < or equal to 50% for > or equal to 5 days, mild depletion of body fat, severe depletion of muscle mass. Continues  GOAL:   Patient will meet greater than or equal to 90% of their needs  MONITOR:   Diet advancement, Weight trends, Labs, I & O's, Other (Comment) (TPN)  ASSESSMENT:   81 y.o. female with medical history significant of gallstones presents to the hospital with the chief complaint of abdominal pain.  she had been experiencing abdominal pain for 4 weeks prior to this admission, had been diagnosed with gallstones and was scheduled to see general surgery this week. However due to worsening of her symptoms, she presented to the ED on 10/28/16. General surgery was consulted. She was assessed to have symptomatic cholelithiasis and choledocholithiasis. MRCP was negative for obstruction. She underwent laparoscopic cholecystectomy with IOC on 10/30/16 which showed necrotic gallbladder with free intraperitoneal bile, enlarged CBD with free flow into the duodenum.   Significant events:  1/22: lap chole with cholangiogram 1/30: abd KUB with suspicion for post-op ileus; NG tube placed  2/2: high NG output from 1/31-2/1 (1369ml) but  decreased 2/1-2/2 (300 ml). RN reported foul smell from NG tube --CCS may d/c NG   Pt reports less abdominal pain today and had 3 BMs yesterday. Pt reports some nausea but denies any emesis. Plan is to clamp NGT tube and possibly remove it later today if trial goes well. Pt continues on TPN. Increasing rate to 52ml/hr today. Per Pharmacy note, "K+ low but improved to 3.4 --> 6 runs KCL yesterday but only 4 charted as given, Mag low at 1.6; Corrected Ca wnl, phos is at lower end of therapeutic range." Plan to continue to monitor and supplement electrolytes, Mg, and Phos as needed per pharmacy. Pt remains at risk for refeeding. Pt with weight gain since admit.   Medications reviewed and include: heparin, insulin, Mg Sulfate, reglan, protonix, zosyn, KCl, Kphos in dextrose, florastor, morphine   Labs reviewed: K 3.4(L), Ca 7.6(L) adj. 9.2 wnl, Mg 1.6(L), P 2.6 wnl, Alb 2.0(L), AST 11(L), ALT 13(L) Hgb 8.2(L), Hct 23.6(L) cbgs- 123, 148 x 24hrs  Diet Order:  Diet NPO time specified .TPN (CLINIMIX-E) Adult .TPN (CLINIMIX-E) Adult  Skin:  Wound (see comment) (abdominal incision )  Last BM:  1/31  Height:   Ht Readings from Last 1 Encounters:  10/28/16 5\' 3"  (1.6 m)    Weight:   Wt Readings from Last 1 Encounters:  11/08/16 163 lb 11.2 oz (74.3 kg)    Ideal Body Weight:  52.3 kg  BMI:  Body mass index is 29 kg/m.  Estimated Nutritional Needs:   Kcal:  [id number]  Protein:  80-90g  Fluid:  1.8L/day  EDUCATION NEEDS:   Education needs addressed  Koleen Distance, RD, LDN Pager #[phone]

## 2016-11-10 NOTE — Progress Notes (Signed)
Patient ID: Melanie [name], female   DOB: May 25, 1936, 81 y.o.   MRN: [id number]  [facility] Surgery Progress Note  11 Days Post-Op  Subjective: Reports less abdominal pain than yesterday. States that she ambulated once yesterday. She had 3 soft BM's yesterday. She reports some nausea, no emesis.  Objective: Vital signs in last 24 hours: Temp:  [97.8 F (36.6 C)-99.1 F (37.3 C)] 99.1 F (37.3 C) (02/02 0620) Pulse Rate:  [88-100] 88 (02/02 0620) Resp:  [16-18] 16 (02/02 0620) BP: (140-154)/(45-63) 143/45 (02/02 0620) SpO2:  [95 %-99 %] 99 % (02/02 0620) Last BM Date: 11/08/16  Intake/Output from previous day: 02/01 0701 - 02/02 0700 In: 1160.5 [P.O.:20; I.V.:490.5; IV Piggyback:650] Out: 1800 [Urine:1500; Emesis/NG output:300] Intake/Output this shift: No intake/output data recorded.  PE: Gen: alert, NAD,pleasant Pulm: effort normal Abd: Soft, mild distension, +BS, mild right sided tenderness, incisions C/D/I, no hernias    Lab Results:   Recent Labs  11/09/16 0447 11/10/16 0430  WBC 11.9* 10.1  HGB 8.6* 8.2*  HCT 24.7* 23.6*  PLT 437* 463*   BMET  Recent Labs  11/09/16 0447 11/10/16 0430  NA 137 137  K 3.1* 3.4*  CL 104 104  CO2 25 26  GLUCOSE 123* 148*  BUN 8 9  CREATININE 0.58 0.56  CALCIUM 7.7* 7.6*   PT/INR No results for input(s): LABPROT, INR in the last 72 hours. CMP     Component Value Date/Time   NA 137 11/10/2016 0430   K 3.4 (L) 11/10/2016 0430   CL 104 11/10/2016 0430   CO2 26 11/10/2016 0430   GLUCOSE 148 (H) 11/10/2016 0430   BUN 9 11/10/2016 0430   CREATININE 0.56 11/10/2016 0430   CALCIUM 7.6 (L) 11/10/2016 0430   PROT 5.5 (L) 11/10/2016 0430   ALBUMIN 2.0 (L) 11/10/2016 0430   AST 11 (L) 11/10/2016 0430   ALT 13 (L) 11/10/2016 0430   ALKPHOS 93 11/10/2016 0430   BILITOT 0.6 11/10/2016 0430   GFRNONAA >60 11/10/2016 0430   GFRAA >60 11/10/2016 0430   Lipase     Component Value Date/Time   LIPASE 26 11/05/2016  0512       Studies/Results: No results found.  Anti-infectives: Anti-infectives    Start     Dose/Rate Route Frequency Ordered Stop   11/04/16 0900  piperacillin-tazobactam (ZOSYN) IVPB 3.375 g     3.375 g 12.5 mL/hr over 240 Minutes Intravenous Every 8 hours 11/04/16 0833     10/30/16 1600  piperacillin-tazobactam (ZOSYN) IVPB 3.375 g  Status:  Discontinued     3.375 g 12.5 mL/hr over 240 Minutes Intravenous Every 8 hours 10/30/16 1539 11/01/16 1814   10/29/16 1200  piperacillin-tazobactam (ZOSYN) IVPB 3.375 g  Status:  Discontinued     3.375 g 12.5 mL/hr over 240 Minutes Intravenous Every 8 hours 10/29/16 0941 10/30/16 1539       Assessment/Plan Symptomatic cholelithiasis S/p lap chole with IOC 1/22 Dr. Hassell Done - POD 11 - IOC negative - Patient has had normal postop CT, HIDA, CXR, c diff PCR, u/a - WBC trending down, 10.1today; afebrile - NG with 300cc/24hr  HTN COPD GERD- protonix H/o infected knee after TKA - takes daily amoxicillin Malnutrition - prealbumin <5 (2/1), on TPN  ID - zosyn 1/21>>1/24, zosyn 1/27>> FEN - IVF, NPO/NGT, TPN VTE - SCDs, heparin  Plan - NG output significantly less. WBC WNL today. Plan to clamp NG tube today and repeat CT scan with oral contrast. Continue reglan and  TPN. Continue to encourage PT/mobilization.     LOS: 13 days    [name] , [facility] Surgery 11/10/2016, 8:10 AM Pager: [phone] Consults: [phone] Mon-Fri 7:00 am-4:30 pm Sat-Sun 7:00 am-11:30 am

## 2016-11-10 NOTE — Progress Notes (Signed)
[facility] [place] [place] contacted on behalf of Dr. Algis Liming. Asked receptionist to please have Dr. Kipp Laurence contact Dr. Algis Liming at his request.  Contact info for office: [phone], option [phone].

## 2016-11-10 NOTE — Progress Notes (Signed)
PHARMACY - ADULT TOTAL PARENTERAL NUTRITION CONSULT NOTE   Pharmacy Consult for TPN Indication: prolonged ileus  Patient Measurements: Height: 5\' 3"  (160 cm) Weight: 163 lb 11.2 oz (74.3 kg) (standing scale) IBW/kg (Calculated) : 52.4 TPN AdjBW (KG): 56.9 Body mass index is 29 kg/m. Usual Weight:   Insulin Requirements: no hx DM; 4 units from sensitive SSI q6h since TPN rate increased to 50 ml/hr yesterday (est~8 units in 24 hrs)  Current Nutrition: ensure bid started on 1/25 (last dose received was on 1/26); boost TID started on 1/29 (last dose received on 1/29) - now NPO - TPN started on 1/31  IVF: none  Central access: PICC TPN start date: 11/08/16  ASSESSMENT                                                                                                          HPI: Patient is [name] with hx of gallstones presented to the ED on 1/20 with c/o abdominal pain. MRCP on 1/22 showed distended gallbladder with tiny gallstones and wall thickening with suspicion for acute cholecystitis.  Patient underwent lap cholecystectomy with intraoperative cholangiogram on 1/22.  To start TPN for prolonged ileus and poor oral intake.  Significant events:  1/22: lap chole with cholangiogram 1/30: abd KUB with suspicion for post-op ileus; NG tube placed  2/2: high NG output from 1/31-2/1 (1353ml) but decreased 2/1-2/2 (300 ml). RN reported foul smell from NG tube --CCS may d/c NG   Today:    Glucose (goal <150): all wnl except for one reading of 158  Electrolytes: K+ low but improved to 3.4 --> 6 runs KCL yesterday but only 4 charted as given, Mag low at 1.6; CorrCa wnl, phos is at lower end of therapeutic range  Renal: scr ok (crcl~54)  LFTs: wnl  TGs: 85 (2/1)  Prealbumin: <5 (2/1)  NUTRITIONAL GOALS                                                                                             RD recs: Kcal:  1750-1950 Protein:  80-90g Fluid:  1.8L/day  Clinimix E 5/20 at a goal  rate of 70 ml/hr + 20% fat emulsion at 20 ml/hr to provide: 84 g/day protein, 1958 Kcal/day.  (+++There is currently a Psychologist, prison and probation services of Clinimix solution. Pharmacy will use Clinimix product based on what we have available in stock+++)  PLAN  Now: - Magnesium 4 gm x1 per MD - potassium chloride 30 meq IV per MD - potassium phosphate 10 mmol IV x1  At 1800 today:  Increase Clinimix E 5/20 to goal rate of 70 ml/hr.  20% fat emulsion at 20 ml/hr over 12 hrs  Trace elements are on Producer, television/film/video. As per current [place] standard, trace elements will be provided every other day, multivitamins will be added daily.        Add trace elements inTPN bag today   Continue sensitive SSI q6h   TPN lab panels on Mondays & Thursdays.  F/u daily.  Patient is at risk for refeeding syndrome -- will monitor electrolytes closely (daily labs ordered through 2/5)   Llewelyn Sheaffer P 11/10/2016,8:06 AM

## 2016-11-11 LAB — CBC
HEMATOCRIT: 23.6 % — AB (ref 36.0–46.0)
HEMOGLOBIN: 8.1 g/dL — AB (ref 12.0–15.0)
MCH: 26.6 pg (ref 26.0–34.0)
MCHC: 34.3 g/dL (ref 30.0–36.0)
MCV: 77.6 fL — ABNORMAL LOW (ref 78.0–100.0)
Platelets: 530 10*3/uL — ABNORMAL HIGH (ref 150–400)
RBC: 3.04 MIL/uL — ABNORMAL LOW (ref 3.87–5.11)
RDW: 15.3 % (ref 11.5–15.5)
WBC: 9.8 10*3/uL (ref 4.0–10.5)

## 2016-11-11 LAB — COMPREHENSIVE METABOLIC PANEL
ALBUMIN: 2 g/dL — AB (ref 3.5–5.0)
ALT: 13 U/L — AB (ref 14–54)
AST: 10 U/L — AB (ref 15–41)
Alkaline Phosphatase: 96 U/L (ref 38–126)
Anion gap: 7 (ref 5–15)
BUN: 11 mg/dL (ref 6–20)
CHLORIDE: 103 mmol/L (ref 101–111)
CO2: 26 mmol/L (ref 22–32)
CREATININE: 0.56 mg/dL (ref 0.44–1.00)
Calcium: 7.7 mg/dL — ABNORMAL LOW (ref 8.9–10.3)
GFR calc Af Amer: >60 mL/min (ref >60)
GFR calc non Af Amer: >60 mL/min (ref >60)
GLUCOSE: 144 mg/dL — AB (ref 65–99)
Potassium: 3.5 mmol/L (ref 3.5–5.1)
SODIUM: 136 mmol/L (ref 135–145)
Total Bilirubin: 0.7 mg/dL (ref 0.3–1.2)
Total Protein: 5.2 g/dL — ABNORMAL LOW (ref 6.5–8.1)

## 2016-11-11 LAB — MAGNESIUM: Magnesium: 2 mg/dL (ref 1.7–2.4)

## 2016-11-11 LAB — GLUCOSE, CAPILLARY
GLUCOSE-CAPILLARY: 136 mg/dL — AB (ref 65–99)
Glucose-Capillary: 106 mg/dL — ABNORMAL HIGH (ref 65–99)
Glucose-Capillary: 115 mg/dL — ABNORMAL HIGH (ref 65–99)
Glucose-Capillary: 150 mg/dL — ABNORMAL HIGH (ref 65–99)

## 2016-11-11 LAB — LIPASE, BLOOD: LIPASE: 13 U/L (ref 11–51)

## 2016-11-11 LAB — PHOSPHORUS: Phosphorus: 3.4 mg/dL (ref 2.5–4.6)

## 2016-11-11 MED ORDER — M.V.I. ADULT IV INJ
INTRAVENOUS | Status: AC
  Administered 2016-11-11: 18:00:00 via INTRAVENOUS
  Filled 2016-11-11: qty 1680

## 2016-11-11 MED ORDER — FAT EMULSION 20 % IV EMUL
240.0000 mL | INTRAVENOUS | Status: AC
  Administered 2016-11-11: 240 mL via INTRAVENOUS
  Filled 2016-11-11: qty 250

## 2016-11-11 MED ORDER — POTASSIUM CHLORIDE 10 MEQ/50ML IV SOLN
10.0000 meq | INTRAVENOUS | Status: AC
  Administered 2016-11-11 (×4): 10 meq via INTRAVENOUS
  Filled 2016-11-11 (×4): qty 50

## 2016-11-11 NOTE — Progress Notes (Signed)
PHARMACY - ADULT TOTAL PARENTERAL NUTRITION CONSULT NOTE   Pharmacy Consult for TPN Indication: prolonged ileus  Patient Measurements: Height: 5\' 3"  (160 cm) Weight: 163 lb 11.2 oz (74.3 kg) (standing scale) IBW/kg (Calculated) : 52.4 TPN AdjBW (KG): 56.9 Body mass index is 29 kg/m. Usual Weight:   Insulin Requirements: no hx DM; 4 units SSI yesterday  Current Nutrition: NPO  IVF: none  Central access: PICC TPN start date: 11/08/16  ASSESSMENT                                                                                                          HPI: Patient is [name] with hx of gallstones presented to the ED on 1/20 with c/o abdominal pain. MRCP on 1/22 showed distended gallbladder with tiny gallstones and wall thickening with suspicion for acute cholecystitis. Patient underwent lap cholecystectomy with intraoperative cholangiogram on 1/22.  To start TPN for prolonged ileus and poor oral intake.  Significant events:  1/22: lap chole with cholangiogram 1/30: abd KUB with suspicion for post-op ileus; NG tube placed  2/2: high NG output from 1/31-2/1 (1365ml) but decreased 2/1-2/2 (300 ml). RN reported foul smell from NG tube --CCS may d/c NG 2/3: trial clamping NG   Today:   Glucose (goal <150): mostly wnl (max 153 in past 24 hr)  Electrolytes: 1025 ml output from NG yesterday   K minimally improved after 45 mEq yesterday, prob d/t high NG output  Mg, Phos improved after repletion  Na, Cl, CaCorr, bicarb wnl  Renal: scr/BUN ok; CrCl 56 ml/min  LFTs: wnl; albumin low  TGs: wnl  Prealbumin: <5 (2/1)  NUTRITIONAL GOALS                                                                                             RD recs (2/2) Kcal:  1750-1950 Protein:  80-90g Fluid:  1.8L/day  Clinimix E 5/20 at a goal rate of 70 ml/hr + 20% fat emulsion at 20 ml/hr to provide: 84 g/day protein, 1958 Kcal/day.  (+++There is currently a Psychologist, prison and probation services of Clinimix solution.  Pharmacy will use Clinimix product based on what we have available in stock+++)  PLAN  Now:  potassium chloride 10 meq IV x 4  Goal K = 4.0; Mg = 2.0 with ileus  At 1800 today:  Continue Clinimix E 5/20 at goal rate of 70 ml/hr.  20% fat emulsion at 20 ml/hr over 12 hrs  Trace elements are on Producer, television/film/video. As per current [place] standard, trace elements will be provided every other day (last added 2/2), multivitamins will be added daily.  Continue sensitive SSI q6h   TPN lab panels on Mondays & Thursdays.  F/u daily.  Patient is at risk for refeeding syndrome -- will monitor electrolytes closely (daily labs ordered through 2/5)   Reuel Boom, PharmD, BCPS Pager: [phone] 11/11/2016, 1:25 PM

## 2016-11-11 NOTE — Progress Notes (Signed)
Patient states that pain is 7/10 in abdomen.  IV pain medication given.  Son in room with patient.

## 2016-11-11 NOTE — Progress Notes (Signed)
Patient ID: Melanie [name], female   DOB: 1935-12-04, 81 y.o.   MRN: [id number] [facility] Progress Note:   12 Days Post-Op  Subjective: Mental status is more alert and clear Objective: Vital signs in last 24 hours: Temp:  [98.1 F (36.7 C)-99.3 F (37.4 C)] 99.3 F (37.4 C) (02/03 0620) Pulse Rate:  [80-88] 88 (02/03 0620) Resp:  [16-18] 16 (02/03 0620) BP: (146-172)/(51-68) 167/52 (02/03 0620) SpO2:  [93 %-98 %] 97 % (02/03 0620)  Intake/Output from previous day: 02/02 0701 - 02/03 0700 In: 2292.3 [I.V.:2092.3; IV Piggyback:200] Out: 2875 [Urine:1525; Emesis/NG output:1300; Stool:50] Intake/Output this shift: No intake/output data recorded.  Physical Exam: Work of breathing is not labored.  NG to suction with bilious drainage.  She is having BMs and flatus but still complains of abdominal pain  Lab Results:  Results for orders placed or performed during the hospital encounter of 10/28/16 (from the past 48 hour(s))  Glucose, capillary     Status: Abnormal   Collection Time: 11/09/16 12:02 PM  Result Value Ref Range   Glucose-Capillary 135 (H) 65 - 99 mg/dL  Glucose, capillary     Status: Abnormal   Collection Time: 11/09/16  5:33 PM  Result Value Ref Range   Glucose-Capillary 141 (H) 65 - 99 mg/dL  Glucose, capillary     Status: Abnormal   Collection Time: 11/10/16 12:12 AM  Result Value Ref Range   Glucose-Capillary 135 (H) 65 - 99 mg/dL  Comprehensive metabolic panel     Status: Abnormal   Collection Time: 11/10/16  4:30 AM  Result Value Ref Range   Sodium 137 135 - 145 mmol/L   Potassium 3.4 (L) 3.5 - 5.1 mmol/L   Chloride 104 101 - 111 mmol/L   CO2 26 22 - 32 mmol/L   Glucose, Bld 148 (H) 65 - 99 mg/dL   BUN 9 6 - 20 mg/dL   Creatinine, Ser 0.56 0.44 - 1.00 mg/dL   Calcium 7.6 (L) 8.9 - 10.3 mg/dL   Total Protein 5.5 (L) 6.5 - 8.1 g/dL   Albumin 2.0 (L) 3.5 - 5.0 g/dL   AST 11 (L) 15 - 41 U/L   ALT 13 (L) 14 - 54 U/L   Alkaline Phosphatase 93 38 -  126 U/L   Total Bilirubin 0.6 0.3 - 1.2 mg/dL   GFR calc non Af Amer >60 >60 mL/min   GFR calc Af Amer >60 >60 mL/min    Comment: (NOTE) The eGFR has been calculated using the CKD EPI equation. This calculation has not been validated in all clinical situations. eGFR's persistently <60 mL/min signify possible Chronic Kidney Disease.    Anion gap 7 5 - 15  Magnesium     Status: Abnormal   Collection Time: 11/10/16  4:30 AM  Result Value Ref Range   Magnesium 1.6 (L) 1.7 - 2.4 mg/dL  Phosphorus     Status: None   Collection Time: 11/10/16  4:30 AM  Result Value Ref Range   Phosphorus 2.6 2.5 - 4.6 mg/dL  CBC     Status: Abnormal   Collection Time: 11/10/16  4:30 AM  Result Value Ref Range   WBC 10.1 4.0 - 10.5 K/uL   RBC 3.06 (L) 3.87 - 5.11 MIL/uL   Hemoglobin 8.2 (L) 12.0 - 15.0 g/dL   HCT 23.6 (L) 36.0 - 46.0 %   MCV 77.1 (L) 78.0 - 100.0 fL   MCH 26.8 26.0 - 34.0 pg   MCHC 34.7 30.0 -  36.0 g/dL   RDW 14.9 11.5 - 15.5 %   Platelets 463 (H) 150 - 400 K/uL  Glucose, capillary     Status: Abnormal   Collection Time: 11/10/16  6:14 AM  Result Value Ref Range   Glucose-Capillary 158 (H) 65 - 99 mg/dL  Glucose, capillary     Status: Abnormal   Collection Time: 11/10/16 12:03 PM  Result Value Ref Range   Glucose-Capillary 133 (H) 65 - 99 mg/dL   Comment 1 Notify RN    Comment 2 Document in Chart   Glucose, capillary     Status: Abnormal   Collection Time: 11/10/16 11:44 PM  Result Value Ref Range   Glucose-Capillary 153 (H) 65 - 99 mg/dL  Comprehensive metabolic panel     Status: Abnormal   Collection Time: 11/11/16  5:16 AM  Result Value Ref Range   Sodium 136 135 - 145 mmol/L   Potassium 3.5 3.5 - 5.1 mmol/L   Chloride 103 101 - 111 mmol/L   CO2 26 22 - 32 mmol/L   Glucose, Bld 144 (H) 65 - 99 mg/dL   BUN 11 6 - 20 mg/dL   Creatinine, Ser 0.56 0.44 - 1.00 mg/dL   Calcium 7.7 (L) 8.9 - 10.3 mg/dL   Total Protein 5.2 (L) 6.5 - 8.1 g/dL   Albumin 2.0 (L) 3.5 - 5.0  g/dL   AST 10 (L) 15 - 41 U/L   ALT 13 (L) 14 - 54 U/L   Alkaline Phosphatase 96 38 - 126 U/L   Total Bilirubin 0.7 0.3 - 1.2 mg/dL   GFR calc non Af Amer >60 >60 mL/min   GFR calc Af Amer >60 >60 mL/min    Comment: (NOTE) The eGFR has been calculated using the CKD EPI equation. This calculation has not been validated in all clinical situations. eGFR's persistently <60 mL/min signify possible Chronic Kidney Disease.    Anion gap 7 5 - 15  Magnesium     Status: None   Collection Time: 11/11/16  5:16 AM  Result Value Ref Range   Magnesium 2.0 1.7 - 2.4 mg/dL  Phosphorus     Status: None   Collection Time: 11/11/16  5:16 AM  Result Value Ref Range   Phosphorus 3.4 2.5 - 4.6 mg/dL  CBC     Status: Abnormal   Collection Time: 11/11/16  5:16 AM  Result Value Ref Range   WBC 9.8 4.0 - 10.5 K/uL   RBC 3.04 (L) 3.87 - 5.11 MIL/uL   Hemoglobin 8.1 (L) 12.0 - 15.0 g/dL   HCT 23.6 (L) 36.0 - 46.0 %   MCV 77.6 (L) 78.0 - 100.0 fL   MCH 26.6 26.0 - 34.0 pg   MCHC 34.3 30.0 - 36.0 g/dL   RDW 15.3 11.5 - 15.5 %   Platelets 530 (H) 150 - 400 K/uL  Glucose, capillary     Status: Abnormal   Collection Time: 11/11/16  6:13 AM  Result Value Ref Range   Glucose-Capillary 150 (H) 65 - 99 mg/dL    Radiology/Results: Ct Abdomen Pelvis W Contrast  Result Date: 11/10/2016 CLINICAL DATA:  Recent gallbladder surgery with increasing abdominal pain, initial encounter EXAM: CT ABDOMEN AND PELVIS WITH CONTRAST TECHNIQUE: Multidetector CT imaging of the abdomen and pelvis was performed using the standard protocol following bolus administration of intravenous contrast. CONTRAST:  159m ISOVUE-300 IOPAMIDOL (ISOVUE-300) INJECTION 61% COMPARISON:  11/02/2016 FINDINGS: Lower chest: No acute abnormality. Hepatobiliary: Gallbladder has been surgically removed. The liver is  within normal limits. In the gallbladder fossa, a small postoperative fluid collection is noted. When compared with the recent examination this  is stable in appearance. No air is noted to suggest abscess. Pancreas: Pancreas is within normal limits. Spleen: The spleen is unremarkable. Adrenals/Urinary Tract: Adrenal glands are unremarkable. Kidneys are normal, without renal calculi, focal lesion, or hydronephrosis. Bladder is unremarkable. Stomach/Bowel: Nasogastric catheter is noted within the stomach. Small bowel dilatation with both air and fluid is again identified similar to that seen on the prior exam likely related to a postoperative ileus. The more distal ileum is within normal limits. No definitive transition zone is seen. The appendix is unremarkable. The colon shows scattered diverticular change without diverticulitis. Vascular/Lymphatic: Aortic atherosclerosis. No enlarged abdominal or pelvic lymph nodes. Reproductive: Fibroid change in the uterus is again noted. Other: No abdominal wall hernia or abnormality. No abdominopelvic ascites. Musculoskeletal: Postsurgical in the lower lumbar spine. No acute bony abnormality is noted. Changes are noted IMPRESSION: When compared with the prior CT examination, the overall appearance is stable. Persistent small bowel dilatation is seen. Postoperative fluid in the gallbladder fossa extending inferiorly along the liver also stable from the prior exam. No air to suggest abscess formation is noted. Electronically Signed   By: Inez Catalina M.D.   On: 11/10/2016 15:19    Anti-infectives: Anti-infectives    Start     Dose/Rate Route Frequency Ordered Stop   11/04/16 0900  piperacillin-tazobactam (ZOSYN) IVPB 3.375 g     3.375 g 12.5 mL/hr over 240 Minutes Intravenous Every 8 hours 11/04/16 0833     10/30/16 1600  piperacillin-tazobactam (ZOSYN) IVPB 3.375 g  Status:  Discontinued     3.375 g 12.5 mL/hr over 240 Minutes Intravenous Every 8 hours 10/30/16 1539 11/01/16 1814   10/29/16 1200  piperacillin-tazobactam (ZOSYN) IVPB 3.375 g  Status:  Discontinued     3.375 g 12.5 mL/hr over 240 Minutes  Intravenous Every 8 hours 10/29/16 0941 10/30/16 1539      Assessment/Plan: Problem List: Patient Active Problem List   Diagnosis Date Noted  . Necrotic cholecystitis s/p lap cholecystectomy 10/30/2016 11/09/2016  . GERD (gastroesophageal reflux disease) 11/09/2016  . Hypertension   . Hyperlipidemia   . COPD (chronic obstructive pulmonary disease) (HCC)     CT scan did not show any cause for her ileus.  Will trial clamp NG for now.  Check lipase 12 Days Post-Op    LOS: 14 days   Matt B. Hassell Done, MD, [facility] [phone] beeper [phone]  11/11/2016 8:54 AM

## 2016-11-12 ENCOUNTER — Inpatient Hospital Stay (HOSPITAL_COMMUNITY): Payer: Medicare Other

## 2016-11-12 LAB — COMPREHENSIVE METABOLIC PANEL WITH GFR
ALT: 13 U/L — ABNORMAL LOW (ref 14–54)
AST: 14 U/L — ABNORMAL LOW (ref 15–41)
Albumin: 2 g/dL — ABNORMAL LOW (ref 3.5–5.0)
Alkaline Phosphatase: 108 U/L (ref 38–126)
Anion gap: 5 (ref 5–15)
BUN: 15 mg/dL (ref 6–20)
CO2: 26 mmol/L (ref 22–32)
Calcium: 7.8 mg/dL — ABNORMAL LOW (ref 8.9–10.3)
Chloride: 103 mmol/L (ref 101–111)
Creatinine, Ser: 0.64 mg/dL (ref 0.44–1.00)
GFR calc Af Amer: >60 mL/min
GFR calc non Af Amer: >60 mL/min
Glucose, Bld: 135 mg/dL — ABNORMAL HIGH (ref 65–99)
Potassium: 3.8 mmol/L (ref 3.5–5.1)
Sodium: 134 mmol/L — ABNORMAL LOW (ref 135–145)
Total Bilirubin: 0.6 mg/dL (ref 0.3–1.2)
Total Protein: 5.6 g/dL — ABNORMAL LOW (ref 6.5–8.1)

## 2016-11-12 LAB — MAGNESIUM: Magnesium: 1.7 mg/dL (ref 1.7–2.4)

## 2016-11-12 LAB — GLUCOSE, CAPILLARY
GLUCOSE-CAPILLARY: 120 mg/dL — AB (ref 65–99)
Glucose-Capillary: 107 mg/dL — ABNORMAL HIGH (ref 65–99)
Glucose-Capillary: 135 mg/dL — ABNORMAL HIGH (ref 65–99)

## 2016-11-12 LAB — PHOSPHORUS: PHOSPHORUS: 3 mg/dL (ref 2.5–4.6)

## 2016-11-12 LAB — HEMOGLOBIN A1C
HEMOGLOBIN A1C: 5.4 % (ref 4.8–5.6)
MEAN PLASMA GLUCOSE: 108 mg/dL

## 2016-11-12 MED ORDER — TRACE MINERALS CR-CU-MN-SE-ZN 10-1000-500-60 MCG/ML IV SOLN
INTRAVENOUS | Status: AC
  Administered 2016-11-12: 17:00:00 via INTRAVENOUS
  Filled 2016-11-12: qty 1800

## 2016-11-12 MED ORDER — FAT EMULSION 20 % IV EMUL
240.0000 mL | INTRAVENOUS | Status: AC
  Administered 2016-11-12: 240 mL via INTRAVENOUS
  Filled 2016-11-12: qty 250

## 2016-11-12 MED ORDER — POTASSIUM CHLORIDE 10 MEQ/50ML IV SOLN
10.0000 meq | INTRAVENOUS | Status: AC
  Administered 2016-11-12 (×3): 10 meq via INTRAVENOUS
  Filled 2016-11-12 (×3): qty 50

## 2016-11-12 MED ORDER — MAGNESIUM SULFATE 4 GM/100ML IV SOLN
4.0000 g | Freq: Once | INTRAVENOUS | Status: AC
  Administered 2016-11-12: 4 g via INTRAVENOUS
  Filled 2016-11-12: qty 100

## 2016-11-12 MED ORDER — METOCLOPRAMIDE HCL 5 MG/ML IJ SOLN
5.0000 mg | Freq: Four times a day (QID) | INTRAMUSCULAR | Status: DC
  Administered 2016-11-13 (×2): 5 mg via INTRAVENOUS
  Filled 2016-11-12 (×2): qty 2

## 2016-11-12 NOTE — Progress Notes (Signed)
Offered to ambulate patient at 43.  Pt did not wish to ambulate, but was willing to be up in the rocking chair.  At 2100 pt complained of a 10/10 pain and was in tears guarding her abdomen. Offered to ambulate patient again, patient stated she was in too much pain. Educated patient on importance of ambulation with ileus. Pain medication given at 2100.  Will continue to monitor and offer ambulation.  Roselind Rily

## 2016-11-12 NOTE — Progress Notes (Signed)
Patient ID: Melanie [name], female   DOB: 07-19-1936, 81 y.o.   MRN: [id number] [facility] Progress Note:   13 Days Post-Op  Subjective: Mental status is clear.  Eldest daughter is staying with her.  Objective: Vital signs in last 24 hours: Temp:  [98.2 F (36.8 C)-99.2 F (37.3 C)] 98.2 F (36.8 C) (02/04 0434) Pulse Rate:  [82-85] 85 (02/04 0434) Resp:  [16-18] 16 (02/04 0434) BP: (139-156)/(56-60) 141/59 (02/04 0434) SpO2:  [99 %-100 %] 99 % (02/04 0434)  Intake/Output from previous day: 02/03 0701 - 02/04 0700 In: 2304 [I.V.:1954; IV Piggyback:350] Out: 690 [Urine:530; Emesis/NG output:160] Intake/Output this shift: No intake/output data recorded.  Physical Exam: Work of breathing is not labored.  Patient up and to the commode and having BMs  No nausea with ng clamped.    Lab Results:  Results for orders placed or performed during the hospital encounter of 10/28/16 (from the past 48 hour(s))  Glucose, capillary     Status: Abnormal   Collection Time: 11/10/16 12:03 PM  Result Value Ref Range   Glucose-Capillary 133 (H) 65 - 99 mg/dL   Comment 1 Notify RN    Comment 2 Document in Chart   Glucose, capillary     Status: Abnormal   Collection Time: 11/10/16 11:44 PM  Result Value Ref Range   Glucose-Capillary 153 (H) 65 - 99 mg/dL  Comprehensive metabolic panel     Status: Abnormal   Collection Time: 11/11/16  5:16 AM  Result Value Ref Range   Sodium 136 135 - 145 mmol/L   Potassium 3.5 3.5 - 5.1 mmol/L   Chloride 103 101 - 111 mmol/L   CO2 26 22 - 32 mmol/L   Glucose, Bld 144 (H) 65 - 99 mg/dL   BUN 11 6 - 20 mg/dL   Creatinine, Ser 0.56 0.44 - 1.00 mg/dL   Calcium 7.7 (L) 8.9 - 10.3 mg/dL   Total Protein 5.2 (L) 6.5 - 8.1 g/dL   Albumin 2.0 (L) 3.5 - 5.0 g/dL   AST 10 (L) 15 - 41 U/L   ALT 13 (L) 14 - 54 U/L   Alkaline Phosphatase 96 38 - 126 U/L   Total Bilirubin 0.7 0.3 - 1.2 mg/dL   GFR calc non Af Amer >60 >60 mL/min   GFR calc Af Amer >60 >60  mL/min    Comment: (NOTE) The eGFR has been calculated using the CKD EPI equation. This calculation has not been validated in all clinical situations. eGFR's persistently <60 mL/min signify possible Chronic Kidney Disease.    Anion gap 7 5 - 15  Magnesium     Status: None   Collection Time: 11/11/16  5:16 AM  Result Value Ref Range   Magnesium 2.0 1.7 - 2.4 mg/dL  Phosphorus     Status: None   Collection Time: 11/11/16  5:16 AM  Result Value Ref Range   Phosphorus 3.4 2.5 - 4.6 mg/dL  CBC     Status: Abnormal   Collection Time: 11/11/16  5:16 AM  Result Value Ref Range   WBC 9.8 4.0 - 10.5 K/uL   RBC 3.04 (L) 3.87 - 5.11 MIL/uL   Hemoglobin 8.1 (L) 12.0 - 15.0 g/dL   HCT 23.6 (L) 36.0 - 46.0 %   MCV 77.6 (L) 78.0 - 100.0 fL   MCH 26.6 26.0 - 34.0 pg   MCHC 34.3 30.0 - 36.0 g/dL   RDW 15.3 11.5 - 15.5 %   Platelets 530 (H)  150 - 400 K/uL  Glucose, capillary     Status: Abnormal   Collection Time: 11/11/16  6:13 AM  Result Value Ref Range   Glucose-Capillary 150 (H) 65 - 99 mg/dL  Lipase, blood     Status: None   Collection Time: 11/11/16  9:45 AM  Result Value Ref Range   Lipase 13 11 - 51 U/L  Glucose, capillary     Status: Abnormal   Collection Time: 11/11/16 11:47 AM  Result Value Ref Range   Glucose-Capillary 136 (H) 65 - 99 mg/dL  Glucose, capillary     Status: Abnormal   Collection Time: 11/11/16  6:17 PM  Result Value Ref Range   Glucose-Capillary 106 (H) 65 - 99 mg/dL  Glucose, capillary     Status: Abnormal   Collection Time: 11/11/16 11:56 PM  Result Value Ref Range   Glucose-Capillary 115 (H) 65 - 99 mg/dL  Glucose, capillary     Status: Abnormal   Collection Time: 11/12/16  5:21 AM  Result Value Ref Range   Glucose-Capillary 107 (H) 65 - 99 mg/dL  Comprehensive metabolic panel     Status: Abnormal   Collection Time: 11/12/16  6:25 AM  Result Value Ref Range   Sodium 134 (L) 135 - 145 mmol/L   Potassium 3.8 3.5 - 5.1 mmol/L   Chloride 103 101 - 111  mmol/L   CO2 26 22 - 32 mmol/L   Glucose, Bld 135 (H) 65 - 99 mg/dL   BUN 15 6 - 20 mg/dL   Creatinine, Ser 0.64 0.44 - 1.00 mg/dL   Calcium 7.8 (L) 8.9 - 10.3 mg/dL   Total Protein 5.6 (L) 6.5 - 8.1 g/dL   Albumin 2.0 (L) 3.5 - 5.0 g/dL   AST 14 (L) 15 - 41 U/L   ALT 13 (L) 14 - 54 U/L   Alkaline Phosphatase 108 38 - 126 U/L   Total Bilirubin 0.6 0.3 - 1.2 mg/dL   GFR calc non Af Amer >60 >60 mL/min   GFR calc Af Amer >60 >60 mL/min    Comment: (NOTE) The eGFR has been calculated using the CKD EPI equation. This calculation has not been validated in all clinical situations. eGFR's persistently <60 mL/min signify possible Chronic Kidney Disease.    Anion gap 5 5 - 15  Magnesium     Status: None   Collection Time: 11/12/16  6:25 AM  Result Value Ref Range   Magnesium 1.7 1.7 - 2.4 mg/dL  Phosphorus     Status: None   Collection Time: 11/12/16  6:25 AM  Result Value Ref Range   Phosphorus 3.0 2.5 - 4.6 mg/dL    Radiology/Results: Ct Abdomen Pelvis W Contrast  Result Date: 11/10/2016 CLINICAL DATA:  Recent gallbladder surgery with increasing abdominal pain, initial encounter EXAM: CT ABDOMEN AND PELVIS WITH CONTRAST TECHNIQUE: Multidetector CT imaging of the abdomen and pelvis was performed using the standard protocol following bolus administration of intravenous contrast. CONTRAST:  18m ISOVUE-300 IOPAMIDOL (ISOVUE-300) INJECTION 61% COMPARISON:  11/02/2016 FINDINGS: Lower chest: No acute abnormality. Hepatobiliary: Gallbladder has been surgically removed. The liver is within normal limits. In the gallbladder fossa, a small postoperative fluid collection is noted. When compared with the recent examination this is stable in appearance. No air is noted to suggest abscess. Pancreas: Pancreas is within normal limits. Spleen: The spleen is unremarkable. Adrenals/Urinary Tract: Adrenal glands are unremarkable. Kidneys are normal, without renal calculi, focal lesion, or hydronephrosis.  Bladder is unremarkable. Stomach/Bowel: Nasogastric catheter  is noted within the stomach. Small bowel dilatation with both air and fluid is again identified similar to that seen on the prior exam likely related to a postoperative ileus. The more distal ileum is within normal limits. No definitive transition zone is seen. The appendix is unremarkable. The colon shows scattered diverticular change without diverticulitis. Vascular/Lymphatic: Aortic atherosclerosis. No enlarged abdominal or pelvic lymph nodes. Reproductive: Fibroid change in the uterus is again noted. Other: No abdominal wall hernia or abnormality. No abdominopelvic ascites. Musculoskeletal: Postsurgical in the lower lumbar spine. No acute bony abnormality is noted. Changes are noted IMPRESSION: When compared with the prior CT examination, the overall appearance is stable. Persistent small bowel dilatation is seen. Postoperative fluid in the gallbladder fossa extending inferiorly along the liver also stable from the prior exam. No air to suggest abscess formation is noted. Electronically Signed   By: Inez Catalina M.D.   On: 11/10/2016 15:19    Anti-infectives: Anti-infectives    Start     Dose/Rate Route Frequency Ordered Stop   11/04/16 0900  piperacillin-tazobactam (ZOSYN) IVPB 3.375 g     3.375 g 12.5 mL/hr over 240 Minutes Intravenous Every 8 hours 11/04/16 0833     10/30/16 1600  piperacillin-tazobactam (ZOSYN) IVPB 3.375 g  Status:  Discontinued     3.375 g 12.5 mL/hr over 240 Minutes Intravenous Every 8 hours 10/30/16 1539 11/01/16 1814   10/29/16 1200  piperacillin-tazobactam (ZOSYN) IVPB 3.375 g  Status:  Discontinued     3.375 g 12.5 mL/hr over 240 Minutes Intravenous Every 8 hours 10/29/16 0941 10/30/16 1539      Assessment/Plan: Problem List: Patient Active Problem List   Diagnosis Date Noted  . Necrotic cholecystitis s/p lap cholecystectomy 10/30/2016 11/09/2016  . GERD (gastroesophageal reflux disease) 11/09/2016  .  Hypertension   . Hyperlipidemia   . COPD (chronic obstructive pulmonary disease) (HCC)     On TNA.  Tolerated NG clamping.  Will remove.   13 Days Post-Op    LOS: 15 days   Matt B. Hassell Done, MD, [facility] [phone] beeper [phone]  11/12/2016 8:55 AM

## 2016-11-12 NOTE — Progress Notes (Signed)
PHARMACY - ADULT TOTAL PARENTERAL NUTRITION CONSULT NOTE   Pharmacy Consult for TPN Indication: prolonged ileus  Patient Measurements: Height: 5\' 3"  (160 cm) Weight: 163 lb 11.2 oz (74.3 kg) (standing scale) IBW/kg (Calculated) : 52.4 TPN AdjBW (KG): 56.9 Body mass index is 29 kg/m. Usual Weight:   Insulin Requirements: no hx DM; 4 units SSI yesterday  Current Nutrition: NPO  IVF: none  Central access: PICC TPN start date: 11/08/16  ASSESSMENT                                                                                                          HPI: Patient is [name] with hx of gallstones presented to the ED on 1/20 with c/o abdominal pain. MRCP on 1/22 showed distended gallbladder with tiny gallstones and wall thickening with suspicion for acute cholecystitis. Patient underwent lap cholecystectomy with intraoperative cholangiogram on 1/22.  To start TPN for prolonged ileus and poor oral intake.  Significant events:  1/22: lap chole with cholangiogram 1/30: abd KUB with suspicion for post-op ileus; NG tube placed  2/2: high NG output from 1/31-2/1 (1349ml) but decreased 2/1-2/2 (300 ml). RN reported foul smell from NG tube --CCS may d/c NG 2/3: trial clamping NG - successful 2/4 NGT removed. Switching from E 5/20 to E 5/15 to better align with nutritional goals  Today:   Glucose (goal <150): all wnl in past 24 hr  Electrolytes:   Note 535 ml output from NG yesterday (prior to clamping)  K/Mg wnl but not at goal for pt with ileus  Na sl low  Other lytes stable wnl  Renal: scr/BUN ok; CrCl 56 ml/min  LFTs: wnl; albumin low  TGs: wnl  Prealbumin: very low (2/1)  NUTRITIONAL GOALS                                                                                             RD recs (2/2) Kcal:  1750-1950 Protein:  80-90g Fluid:  1.8L/day  Clinimix E 5/15 (switching from E 5/20 at 70 ml/hr) at a goal rate of 75 ml/hr + 20% fat emulsion at 20 ml/hr x 12 hr to  provide: 90 g/day protein, 1758 Kcal/day.  (+++There is currently a Psychologist, prison and probation services of Clinimix solution. Pharmacy will use Clinimix product based on availability+++)  PLAN  Now:  potassium chloride 10 meq IV x 3  Mag sulfate 4g IV x 1  Goal K = 4.0; Mg = 2.0 with ileus  At 1800 today:  Switch to Clinimix E 5/15 at goal rate of 75 ml/hr.  20% fat emulsion at 20 ml/hr over 12 hrs  Trace elements are on Producer, television/film/video. Per current [place] standard, trace elements will be provided every other day (last added 2/4), multivitamins will be added daily.  Continue sensitive SSI q6h   TPN lab panels on Mondays & Thursdays.  F/u daily.  Patient is at risk for refeeding syndrome -- will monitor electrolytes closely (daily labs ordered through 2/5)   Reuel Boom, PharmD, BCPS Pager: [phone] 11/12/2016, 11:52 AM

## 2016-11-13 LAB — COMPREHENSIVE METABOLIC PANEL
ALBUMIN: 2 g/dL — AB (ref 3.5–5.0)
ALT: 15 U/L (ref 14–54)
ANION GAP: 6 (ref 5–15)
AST: 14 U/L — ABNORMAL LOW (ref 15–41)
Alkaline Phosphatase: 116 U/L (ref 38–126)
BILIRUBIN TOTAL: 0.6 mg/dL (ref 0.3–1.2)
BUN: 16 mg/dL (ref 6–20)
CO2: 27 mmol/L (ref 22–32)
Calcium: 7.9 mg/dL — ABNORMAL LOW (ref 8.9–10.3)
Chloride: 101 mmol/L (ref 101–111)
Creatinine, Ser: 0.61 mg/dL (ref 0.44–1.00)
GFR calc Af Amer: >60 mL/min (ref >60)
GFR calc non Af Amer: >60 mL/min (ref >60)
GLUCOSE: 135 mg/dL — AB (ref 65–99)
POTASSIUM: 4 mmol/L (ref 3.5–5.1)
SODIUM: 134 mmol/L — AB (ref 135–145)
TOTAL PROTEIN: 6 g/dL — AB (ref 6.5–8.1)

## 2016-11-13 LAB — CBC
HCT: 24 % — ABNORMAL LOW (ref 36.0–46.0)
Hemoglobin: 7.8 g/dL — ABNORMAL LOW (ref 12.0–15.0)
MCH: 25.5 pg — ABNORMAL LOW (ref 26.0–34.0)
MCHC: 32.5 g/dL (ref 30.0–36.0)
MCV: 78.4 fL (ref 78.0–100.0)
Platelets: 555 10*3/uL — ABNORMAL HIGH (ref 150–400)
RBC: 3.06 MIL/uL — AB (ref 3.87–5.11)
RDW: 16 % — ABNORMAL HIGH (ref 11.5–15.5)
WBC: 12.1 10*3/uL — AB (ref 4.0–10.5)

## 2016-11-13 LAB — DIFFERENTIAL
Basophils Absolute: 0 10*3/uL (ref 0.0–0.1)
Basophils Relative: 0 %
Eosinophils Absolute: 0.1 10*3/uL (ref 0.0–0.7)
Eosinophils Relative: 1 %
LYMPHS PCT: 12 %
Lymphs Abs: 1.5 10*3/uL (ref 0.7–4.0)
Monocytes Absolute: 1.8 10*3/uL — ABNORMAL HIGH (ref 0.1–1.0)
Monocytes Relative: 15 %
NEUTROS ABS: 8.7 10*3/uL — AB (ref 1.7–7.7)
NEUTROS PCT: 72 %

## 2016-11-13 LAB — GLUCOSE, CAPILLARY
GLUCOSE-CAPILLARY: 128 mg/dL — AB (ref 65–99)
GLUCOSE-CAPILLARY: 127 mg/dL — AB (ref 65–99)
Glucose-Capillary: 120 mg/dL — ABNORMAL HIGH (ref 65–99)
Glucose-Capillary: 132 mg/dL — ABNORMAL HIGH (ref 65–99)
Glucose-Capillary: 132 mg/dL — ABNORMAL HIGH (ref 65–99)
Glucose-Capillary: 140 mg/dL — ABNORMAL HIGH (ref 65–99)

## 2016-11-13 LAB — TRIGLYCERIDES: TRIGLYCERIDES: 91 mg/dL (ref <150)

## 2016-11-13 LAB — MAGNESIUM: MAGNESIUM: 2 mg/dL (ref 1.7–2.4)

## 2016-11-13 LAB — PHOSPHORUS: Phosphorus: 3.5 mg/dL (ref 2.5–4.6)

## 2016-11-13 LAB — PREALBUMIN: PREALBUMIN: 5.9 mg/dL — AB (ref 18–38)

## 2016-11-13 MED ORDER — METHOCARBAMOL 1000 MG/10ML IJ SOLN
500.0000 mg | Freq: Three times a day (TID) | INTRAVENOUS | Status: DC | PRN
  Administered 2016-11-14 – 2016-11-27 (×9): 500 mg via INTRAVENOUS
  Filled 2016-11-13 (×2): qty 5
  Filled 2016-11-13: qty 550
  Filled 2016-11-13: qty 5
  Filled 2016-11-13: qty 550
  Filled 2016-11-13: qty 5
  Filled 2016-11-13 (×2): qty 550
  Filled 2016-11-13 (×3): qty 5
  Filled 2016-11-13: qty 550
  Filled 2016-11-13: qty 5
  Filled 2016-11-13: qty 550
  Filled 2016-11-13 (×2): qty 5
  Filled 2016-11-13: qty 550
  Filled 2016-11-13 (×3): qty 5

## 2016-11-13 MED ORDER — METOCLOPRAMIDE HCL 5 MG/ML IJ SOLN
10.0000 mg | Freq: Four times a day (QID) | INTRAMUSCULAR | Status: DC
  Administered 2016-11-13 – 2016-11-17 (×15): 10 mg via INTRAVENOUS
  Filled 2016-11-13 (×16): qty 2

## 2016-11-13 MED ORDER — TRACE MINERALS CR-CU-MN-SE-ZN 10-1000-500-60 MCG/ML IV SOLN
INTRAVENOUS | Status: AC
  Administered 2016-11-13 (×2): via INTRAVENOUS
  Filled 2016-11-13: qty 1800

## 2016-11-13 MED ORDER — FAT EMULSION 20 % IV EMUL
250.0000 mL | INTRAVENOUS | Status: AC
  Administered 2016-11-13: 250 mL via INTRAVENOUS
  Filled 2016-11-13: qty 250

## 2016-11-13 MED ORDER — SIMETHICONE 80 MG PO CHEW
80.0000 mg | CHEWABLE_TABLET | Freq: Four times a day (QID) | ORAL | Status: DC | PRN
  Administered 2016-11-13 (×2): 80 mg via ORAL
  Filled 2016-11-13 (×2): qty 1

## 2016-11-13 NOTE — Progress Notes (Signed)
Patient ID: Melanie [name], female   DOB: 1935-12-21, 81 y.o.   MRN: [id number]  [facility] Surgery Progress Note  14 Days Post-Op  Subjective: Up in chair. NG removed yesterday. Patient with increased n/v last night and this morning. Feels a little better this morning. Denies increased abdominal distension. States that she is still passing flatus and had loose BM yesterday.   Objective: Vital signs in last 24 hours: Temp:  [98.3 F (36.8 C)-98.6 F (37 C)] 98.6 F (37 C) (02/05 0530) Pulse Rate:  [79-81] 81 (02/05 0530) Resp:  [16-18] 16 (02/05 0530) BP: (134-139)/(44-83) 139/50 (02/05 0530) SpO2:  [97 %-100 %] 100 % (02/05 0530) Last BM Date: 11/12/16  Intake/Output from previous day: 02/04 0701 - 02/05 0700 In: 2333.2 [P.O.:90; I.V.:1993.2; IV Piggyback:250] Out: 990 [Urine:600; Emesis/NG output:390] Intake/Output this shift: Total I/O In: 50 [IV Piggyback:50] Out: 1000 [Urine:200; Emesis/NG output:800]  PE: Gen: alert, NAD,pleasant Pulm: CTAB, effort normal Abd: Soft, mild distension, +BS, mild right sided tenderness, incisions C/D/I, no hernias   Lab Results:   Recent Labs  11/11/16 0516 11/13/16 0542  WBC 9.8 12.1*  HGB 8.1* 7.8*  HCT 23.6* 24.0*  PLT 530* 555*   BMET  Recent Labs  11/12/16 0625 11/13/16 0542  NA 134* 134*  K 3.8 4.0  CL 103 101  CO2 26 27  GLUCOSE 135* 135*  BUN 15 16  CREATININE 0.64 0.61  CALCIUM 7.8* 7.9*   PT/INR No results for input(s): LABPROT, INR in the last 72 hours. CMP     Component Value Date/Time   NA 134 (L) 11/13/2016 0542   K 4.0 11/13/2016 0542   CL 101 11/13/2016 0542   CO2 27 11/13/2016 0542   GLUCOSE 135 (H) 11/13/2016 0542   BUN 16 11/13/2016 0542   CREATININE 0.61 11/13/2016 0542   CALCIUM 7.9 (L) 11/13/2016 0542   PROT 6.0 (L) 11/13/2016 0542   ALBUMIN 2.0 (L) 11/13/2016 0542   AST 14 (L) 11/13/2016 0542   ALT 15 11/13/2016 0542   ALKPHOS 116 11/13/2016 0542   BILITOT 0.6 11/13/2016 0542    GFRNONAA >60 11/13/2016 0542   GFRAA >60 11/13/2016 0542   Lipase     Component Value Date/Time   LIPASE 13 11/11/2016 0945       Studies/Results: Dg Abd 2 Views  Result Date: 11/12/2016 CLINICAL DATA:  Generalized abdominal pain since cholecystostomy 10/30/2016. Concern for ileus EXAM: ABDOMEN - 2 VIEW COMPARISON:  Abdominal CT from 2 days ago FINDINGS: Unchanged small bowel distention, out of proportion to colonic diameter. Small bowel loops in the left abdomen measure up to 35 mm. No evidence of pneumoperitoneum. No concerning mass effect or gas collection. Cholecystectomy clips. Spinal fixation hardware. Clear lung bases. IMPRESSION: Unchanged small bowel dilatation. Postoperative status favors ileus, although there is notable lack of colonic involvement. No pneumoperitoneum. Electronically Signed   By: Monte Fantasia M.D.   On: 11/12/2016 20:54    Anti-infectives: Anti-infectives    Start     Dose/Rate Route Frequency Ordered Stop   11/04/16 0900  piperacillin-tazobactam (ZOSYN) IVPB 3.375 g     3.375 g 12.5 mL/hr over 240 Minutes Intravenous Every 8 hours 11/04/16 0833     10/30/16 1600  piperacillin-tazobactam (ZOSYN) IVPB 3.375 g  Status:  Discontinued     3.375 g 12.5 mL/hr over 240 Minutes Intravenous Every 8 hours 10/30/16 1539 11/01/16 1814   10/29/16 1200  piperacillin-tazobactam (ZOSYN) IVPB 3.375 g  Status:  Discontinued  3.375 g 12.5 mL/hr over 240 Minutes Intravenous Every 8 hours 10/29/16 0941 10/30/16 1539       Assessment/Plan Symptomatic cholelithiasis S/p lap chole with IOC 1/22 Dr. Hassell Done - POD 14 - IOC negative - Patient has had normal postop CT 2/2, HIDA 1/27, CXR 1/26, c diff PCR 1/27, u/a 1/27 - WBC up to 12.1, afebrile - NG removed yesterday, increased n/v today  HTN COPD GERD- protonix H/o infected knee after TKA - takes daily amoxicillin Malnutrition - prealbumin improving, 5.9 (2/5)  ID - zosyn 1/21>>1/24, zosyn 1/27>> FEN -  IVF, NPO, TPN VTE - SCDs, heparin  Plan - NG pulled yesterday, patient with increased n/v today. Seems to still have postop ileus. Will wait on replacing NG at this time, but may need to replace if n/v persists. Increase reglan to 10mg  q6hr. Encourage more mobilization. Decrease narcotic use as much as possible (limit morphine, d/c oxycodone, use more tylenol and robaxin instead) Check CBC in AM   LOS: 16 days    [name] , [facility] Surgery 11/13/2016, 10:42 AM Pager: [phone] Consults: [phone] Mon-Fri 7:00 am-4:30 pm Sat-Sun 7:00 am-11:30 am

## 2016-11-13 NOTE — Care Management Important Message (Signed)
Important Message  Patient Details IM Letter given to Suzanne/Case Manager to present to Patient Name: Melanie [name] MRN: [id number] Date of Birth: November 27, 1935   Medicare Important Message Given:  Yes    Kerin Salen 11/13/2016, 1:23 PMImportant Message  Patient Details  Name: Melanie [name] MRN: [id number] Date of Birth: [birth year]   Medicare Important Message Given:  Yes    Kerin Salen 11/13/2016, 1:22 PM

## 2016-11-13 NOTE — Progress Notes (Signed)
PHARMACY - ADULT TOTAL PARENTERAL NUTRITION CONSULT NOTE   Pharmacy Consult for TPN Indication: prolonged ileus  Patient Measurements: Height: 5\' 3"  (160 cm) Weight: 163 lb 11.2 oz (74.3 kg) (standing scale) IBW/kg (Calculated) : 52.4 TPN AdjBW (KG): 56.9 Body mass index is 29 kg/m. Usual Weight:   Insulin Requirements: no hx DM; 2 units SSI yesterday  Current Nutrition: NPO  IVF: none  Central access: PICC TPN start date: 11/08/16  ASSESSMENT                                                                                                          HPI: Patient is [name] with hx of gallstones presented to the ED on 1/20 with c/o abdominal pain. MRCP on 1/22 showed distended gallbladder with tiny gallstones and wall thickening with suspicion for acute cholecystitis. Patient underwent lap cholecystectomy with intraoperative cholangiogram on 1/22.  To start TPN for prolonged ileus and poor oral intake.  Significant events:  1/22: lap chole with cholangiogram 1/30: abd KUB with suspicion for post-op ileus; NG tube placed  2/2: high NG output from 1/31-2/1 (1356ml) but decreased 2/1-2/2 (300 ml). RN reported foul smell from NG tube --CCS may d/c NG 2/3: trial clamping NG - successful 2/4 NGT removed. Switching from E 5/20 to E 5/15 to better align with nutritional goals  Today:   Glucose (goal <150): all wnl in past 24 hr  Electrolytes:   K/Mg wnl after replacement (at goal for pt with ileus)  Na sl low  Other lytes stable wnl  Renal: scr/BUN ok; CrCl 56 ml/min  LFTs: wnl; albumin low  TGs: wnl  Prealbumin: very low   NUTRITIONAL GOALS                                                                                             RD recs (2/2) Kcal:  1750-1950 Protein:  80-90g Fluid:  1.8L/day  Clinimix E 5/15 (switching from E 5/20 at 70 ml/hr) at a goal rate of 75 ml/hr + 20% fat emulsion at 20 ml/hr x 12 hr to provide: 90 g/day protein, 1758  Kcal/day.  (+++There is currently a Psychologist, prison and probation services of Clinimix solution. Pharmacy will use Clinimix product based on availability+++)  PLAN  At 1800 today:  continue Clinimix E 5/15 at goal rate of 75 ml/hr.  20% fat emulsion at 20 ml/hr over 12 hrs  Trace elements are on national shortage, will be added as available, adding today  multivitamins will be added daily.  Continue sensitive SSI q6h   TPN lab panels on Mondays & Thursdays.  F/u daily.  Patient is at risk for refeeding syndrome -- will monitor electrolytes closely (daily labs ordered through 2/6)   Dolly Rias RPh 11/13/2016, 10:46 AM Pager [phone]

## 2016-11-13 NOTE — Progress Notes (Signed)
Physical Therapy Treatment Patient Details Name: Melanie [name] MRN: [id number] DOB: 1936/05/07 Today's Date: 11/13/2016    History of Present Illness 81 y.o. female with medical history significant of gallstones presents to the hospital with the chief complaint of abdominal pain. s/p laparoscopic cholecystectomy 10/30/16. Post op ileus.    PT Comments    Pt reports she had "a lot of pain" last night which she is still experiencing. She ambulated 45' with RW, distance limited by pain.   Follow Up Recommendations  Home health PT     Equipment Recommendations  Rolling walker with 5" wheels;Other (comment) (shower seat)    Recommendations for Other Services       Precautions / Restrictions Precautions Precautions: Fall Restrictions Weight Bearing Restrictions: No    Mobility  Bed Mobility               General bed mobility comments: OOB in recliner  Transfers Overall transfer level: Needs assistance Equipment used: Rolling walker (2 wheeled) Transfers: Sit to/from Stand Sit to Stand: Min assist         General transfer comment: min A to rise from rocking chair, good safety awareness  Ambulation/Gait Ambulation/Gait assistance: Min guard Ambulation Distance (Feet): 185 Feet Assistive device: Rolling walker (2 wheeled) Gait Pattern/deviations: Step-to pattern;Step-through pattern;Trunk flexed Gait velocity: decr Gait velocity interpretation: Below normal speed for age/gender General Gait Details: distance limited by pain   Stairs            Wheelchair Mobility    Modified Rankin (Stroke Patients Only)       Balance     Sitting balance-Leahy Scale: Good       Standing balance-Leahy Scale: Fair                      Cognition Arousal/Alertness: Awake/alert Behavior During Therapy: WFL for tasks assessed/performed Overall Cognitive Status: Within Functional Limits for tasks assessed                      Exercises       General Comments        Pertinent Vitals/Pain Pain Score: 4  Pain Location: abdomen Pain Descriptors / Indicators: Sore Pain Intervention(s): Limited activity within patient's tolerance;Monitored during session    Home Living                      Prior Function            PT Goals (current goals can now be found in the care plan section) Acute Rehab PT Goals Patient Stated Goal: return home to prior level of function PT Goal Formulation: With patient/family Time For Goal Achievement: 11/16/16 Potential to Achieve Goals: Good Progress towards PT goals: Progressing toward goals    Frequency    Min 3X/week      PT Plan Current plan remains appropriate    Co-evaluation             End of Session Equipment Utilized During Treatment: Gait belt Activity Tolerance: Patient limited by pain Patient left: with call bell/phone within reach;in chair;with nursing/sitter in room     Time: 1200-1223 PT Time Calculation (min) (ACUTE ONLY): 23 min  Charges:  $Gait Training: 8-22 mins $Therapeutic Activity: 8-22 mins                    G Codes:      Philomena Doheny 11/13/2016, 12:49 PM [phone]

## 2016-11-14 ENCOUNTER — Inpatient Hospital Stay (HOSPITAL_COMMUNITY): Payer: Medicare Other

## 2016-11-14 LAB — BASIC METABOLIC PANEL
Anion gap: 7 (ref 5–15)
BUN: 18 mg/dL (ref 6–20)
CALCIUM: 8.1 mg/dL — AB (ref 8.9–10.3)
CO2: 26 mmol/L (ref 22–32)
Chloride: 102 mmol/L (ref 101–111)
Creatinine, Ser: 0.62 mg/dL (ref 0.44–1.00)
GFR calc Af Amer: >60 mL/min (ref >60)
GLUCOSE: 123 mg/dL — AB (ref 65–99)
Potassium: 3.9 mmol/L (ref 3.5–5.1)
Sodium: 135 mmol/L (ref 135–145)

## 2016-11-14 LAB — GLUCOSE, CAPILLARY
GLUCOSE-CAPILLARY: 123 mg/dL — AB (ref 65–99)
GLUCOSE-CAPILLARY: 125 mg/dL — AB (ref 65–99)
GLUCOSE-CAPILLARY: 130 mg/dL — AB (ref 65–99)
GLUCOSE-CAPILLARY: 115 mg/dL — AB (ref 65–99)

## 2016-11-14 LAB — CBC
HCT: 23 % — ABNORMAL LOW (ref 36.0–46.0)
Hemoglobin: 7.8 g/dL — ABNORMAL LOW (ref 12.0–15.0)
MCH: 26.9 pg (ref 26.0–34.0)
MCHC: 33.9 g/dL (ref 30.0–36.0)
MCV: 79.3 fL (ref 78.0–100.0)
PLATELETS: 500 10*3/uL — AB (ref 150–400)
RBC: 2.9 MIL/uL — ABNORMAL LOW (ref 3.87–5.11)
RDW: 16.4 % — AB (ref 11.5–15.5)
WBC: 11.6 10*3/uL — ABNORMAL HIGH (ref 4.0–10.5)

## 2016-11-14 LAB — MAGNESIUM: Magnesium: 1.7 mg/dL (ref 1.7–2.4)

## 2016-11-14 LAB — PHOSPHORUS: Phosphorus: 3.9 mg/dL (ref 2.5–4.6)

## 2016-11-14 MED ORDER — ZOLPIDEM TARTRATE 5 MG PO TABS
5.0000 mg | ORAL_TABLET | Freq: Every evening | ORAL | Status: DC | PRN
Start: 2016-11-14 — End: 2016-11-14

## 2016-11-14 MED ORDER — TRACE MINERALS CR-CU-MN-SE-ZN 10-1000-500-60 MCG/ML IV SOLN
INTRAVENOUS | Status: AC
  Administered 2016-11-14: 18:00:00 via INTRAVENOUS
  Filled 2016-11-14: qty 1800

## 2016-11-14 MED ORDER — POTASSIUM CHLORIDE 20 MEQ/15ML (10%) PO SOLN
20.0000 meq | Freq: Two times a day (BID) | ORAL | Status: DC
  Administered 2016-11-14 – 2016-11-15 (×4): 20 meq via ORAL
  Filled 2016-11-14 (×5): qty 15

## 2016-11-14 MED ORDER — MAGNESIUM SULFATE 4 GM/100ML IV SOLN
4.0000 g | Freq: Once | INTRAVENOUS | Status: AC
  Administered 2016-11-14: 4 g via INTRAVENOUS
  Filled 2016-11-14: qty 100

## 2016-11-14 MED ORDER — LACTINEX PO CHEW
1.0000 | CHEWABLE_TABLET | Freq: Three times a day (TID) | ORAL | Status: DC
  Administered 2016-11-14 – 2016-11-16 (×7): 1 via ORAL
  Filled 2016-11-14 (×15): qty 1

## 2016-11-14 MED ORDER — FAT EMULSION 20 % IV EMUL
250.0000 mL | INTRAVENOUS | Status: AC
  Administered 2016-11-14: 250 mL via INTRAVENOUS
  Filled 2016-11-14: qty 250

## 2016-11-14 NOTE — Progress Notes (Signed)
Patient ID: Melanie [name], female   DOB: 11/15/1935, 81 y.o.   MRN: [id number]  [facility] Surgery Progress Note  15 Days Post-Op  Subjective: No subsequent n/v since yesterday morning. Continues to have loose BM's and pass flatus. Abdomen more distended this morning. Denies any current nausea. Patient reports less abdominal pain yesterday afternoon and this morning.  Objective: Vital signs in last 24 hours: Temp:  [98.2 F (36.8 C)-99 F (37.2 C)] 98.2 F (36.8 C) (02/06 0602) Pulse Rate:  [77-82] 80 (02/06 0602) Resp:  [16-18] 16 (02/06 0602) BP: (128-150)/(51-60) 150/60 (02/06 0602) SpO2:  [100 %] 100 % (02/06 0602) Last BM Date: 11/12/16  Intake/Output from previous day: 02/05 0701 - 02/06 0700 In: 2203.8 [I.V.:1998.8; IV Piggyback:205] Out: 1625 [Urine:825; Emesis/NG output:800] Intake/Output this shift: No intake/output data recorded.  PE: Gen: alert, NAD,pleasant Pulm: CTAB, effort normal Abd: Soft, increased distension from yesterday, +BS, nontender, incisions C/D/I, no hernias   Lab Results:   Recent Labs  11/13/16 0542 11/14/16 0613  WBC 12.1* 11.6*  HGB 7.8* 7.8*  HCT 24.0* 23.0*  PLT 555* 500*   BMET  Recent Labs  11/13/16 0542 11/14/16 0613  NA 134* 135  K 4.0 3.9  CL 101 102  CO2 27 26  GLUCOSE 135* 123*  BUN 16 18  CREATININE 0.61 0.62  CALCIUM 7.9* 8.1*   PT/INR No results for input(s): LABPROT, INR in the last 72 hours. CMP     Component Value Date/Time   NA 135 11/14/2016 0613   K 3.9 11/14/2016 0613   CL 102 11/14/2016 0613   CO2 26 11/14/2016 0613   GLUCOSE 123 (H) 11/14/2016 0613   BUN 18 11/14/2016 0613   CREATININE 0.62 11/14/2016 0613   CALCIUM 8.1 (L) 11/14/2016 0613   PROT 6.0 (L) 11/13/2016 0542   ALBUMIN 2.0 (L) 11/13/2016 0542   AST 14 (L) 11/13/2016 0542   ALT 15 11/13/2016 0542   ALKPHOS 116 11/13/2016 0542   BILITOT 0.6 11/13/2016 0542   GFRNONAA >60 11/14/2016 0613   GFRAA >60 11/14/2016 0613    Lipase     Component Value Date/Time   LIPASE 13 11/11/2016 0945       Studies/Results: Dg Abd 2 Views  Result Date: 11/12/2016 CLINICAL DATA:  Generalized abdominal pain since cholecystostomy 10/30/2016. Concern for ileus EXAM: ABDOMEN - 2 VIEW COMPARISON:  Abdominal CT from 2 days ago FINDINGS: Unchanged small bowel distention, out of proportion to colonic diameter. Small bowel loops in the left abdomen measure up to 35 mm. No evidence of pneumoperitoneum. No concerning mass effect or gas collection. Cholecystectomy clips. Spinal fixation hardware. Clear lung bases. IMPRESSION: Unchanged small bowel dilatation. Postoperative status favors ileus, although there is notable lack of colonic involvement. No pneumoperitoneum. Electronically Signed   By: Monte Fantasia M.D.   On: 11/12/2016 20:54    Anti-infectives: Anti-infectives    Start     Dose/Rate Route Frequency Ordered Stop   11/04/16 0900  piperacillin-tazobactam (ZOSYN) IVPB 3.375 g     3.375 g 12.5 mL/hr over 240 Minutes Intravenous Every 8 hours 11/04/16 0833     10/30/16 1600  piperacillin-tazobactam (ZOSYN) IVPB 3.375 g  Status:  Discontinued     3.375 g 12.5 mL/hr over 240 Minutes Intravenous Every 8 hours 10/30/16 1539 11/01/16 1814   10/29/16 1200  piperacillin-tazobactam (ZOSYN) IVPB 3.375 g  Status:  Discontinued     3.375 g 12.5 mL/hr over 240 Minutes Intravenous Every 8 hours 10/29/16 0941  10/30/16 1539       Assessment/Plan Symptomatic cholelithiasis S/p lap chole with IOC 1/22 Dr. Hassell Done - POD 15 - IOC negative - Patient has had normal postop CT 2/2, HIDA 1/27, CXR 1/26, c diff PCR 1/27, u/a 1/27 - WBC trending down, 11.6 today, afebrile  HTN COPD GERD- protonix H/o infected knee after TKA - takes daily amoxicillin Malnutrition - prealbumin improving, 5.9 (2/5)  ID - zosyn 1/21>>1/24, zosyn 1/27>> FEN - IVF, NPO, TPN VTE - SCDs, heparin  Plan - Increased abdominal distension, but no  subsequent n/v since yesterday morning and she continues to have BM and pass flatus. Will check abdominal xray this morning. Continue increased reglan dose, limited narcotic use (only required 3mg  morphine yesterday), and encourage PT/mobilization. Will consult GI for recommendations on prolonged postop ileus.   LOS: 17 days    Melanie [name] , [facility] Surgery 11/14/2016, 7:58 AM Pager: [phone] Consults: [phone] Mon-Fri 7:00 am-4:30 pm Sat-Sun 7:00 am-11:30 am

## 2016-11-14 NOTE — Consult Note (Signed)
Referring Provider:   Dr. Fanny Skates Primary Care Physician:  Osborne Casco, MD Primary Gastroenterologist:  Dr. Howell Rucks  Reason for Consultation:  Ileus versus obstruction  HPI: Melanie [name] is a 81 y.o. female 15 days status post uncomplicated laparoscopic cholecystectomy who has been unable to get out of the hospital because of problems suggestive of a prolonged postop ileus, characterized by waxing and waning nausea unrelated to food intake, occasional vomiting, diffuse low-grade abdominal discomfort, mild abdominal distention, and radiographic findings showing small bowel dilatation.   She has had a CT and a hepatobiliary scan negative for abscess or bile leak.   Conversation with the consulting surgeon indicates that the operating surgeon did note adhesions low in the pelvis which were not addressed during the time of the procedure.   The patient has had colonoscopy most recently about 10 years ago by Dr. Earle Gell, negative for any polyps although apparently prior to that, there is a history of polyps.  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 She has been maintained on amoxicillin ever since that time.  The patient is having bowel movements in the hospital, no frank diarrhea, just small squirts of liquid stool down again. She has had intermittent NG suction and is currently receiving TNA because of inability to have reliable oral intake.   Past Medical History:  Diagnosis Date  . COPD (chronic obstructive pulmonary disease) ([place])   . Hyperlipidemia   . Hypertension   . Uterine fibroid     Past Surgical History:  Procedure Laterality Date  . BACK SURGERY     X2  . bone removed from skull Right 1957  . CHOLECYSTECTOMY N/A 10/30/2016   Procedure: LAPAROSCOPIC CHOLECYSTECTOMY WITH INTRAOPERATIVE CHOLANGIOGRAM;  Surgeon: Johnathan Hausen, MD;  Location: [facility];  Service: General;  Laterality: N/A;  . JOINT REPLACEMENT     knee    Prior to Admission medications   Medication Sig Start Date End Date Taking? Authorizing Provider  amLODipine (NORVASC) 10 MG tablet Take 0.5 mg by mouth daily.   Yes Historical Provider, MD  amoxicillin (AMOXIL) 875 MG tablet Take 875 mg by mouth daily. 10/02/16  Yes Historical Provider, MD  aspirin EC 81 MG tablet Take 81 mg by mouth daily.   Yes Historical Provider, MD  cloNIDine (CATAPRES) 0.1 MG tablet Take 0.1 mg by mouth daily.    Yes Historical Provider, MD  hydrochlorothiazide (HYDRODIURIL) 25 MG tablet Take 25 mg by mouth daily.   Yes Historical Provider, MD  Multiple Vitamin (MULTIVITAMIN) tablet Take 1 tablet by mouth daily.   Yes Historical Provider, MD  nebivolol (BYSTOLIC) 5 MG tablet Take 5 mg by mouth daily.   Yes Historical Provider, MD  omeprazole (PRILOSEC) 40 MG capsule Take 40 mg by mouth daily.   Yes Historical Provider, MD  potassium chloride SA (K-DUR,KLOR-CON) 20 MEQ tablet Take 20 mEq by mouth 2 (two) times daily.   Yes Historical Provider, MD  FLUZONE HIGH-DOSE 0.5 ML SUSY TO BE ADMINISTERED BY PHARMACIST FOR IMMUNIZATION 07/25/16   Historical Provider, MD    Current Facility-Administered Medications  Medication Dose Route Frequency Provider Last Rate Last Dose  . acetaminophen (TYLENOL) tablet 650 mg  650 mg Oral Q6H PRN [name], PA-C   650 mg at 11/14/16 1312  . amLODipine (NORVASC) tablet 10 mg  10 mg Oral Daily Modena Jansky, MD   10 mg  at 11/14/16 0930  . TPN (CLINIMIX-E) Adult   Intravenous Continuous TPN Fanny Skates, MD       And  . fat emulsion 20 % infusion 250 mL  250 mL Intravenous Continuous TPN Fanny Skates, MD      . heparin injection 5,000 Units  5,000 Units Subcutaneous Q8H Johnathan Hausen, MD   5,000 Units at 11/14/16 1312  . hydrALAZINE (APRESOLINE) injection 10 mg  10 mg Intravenous Q4H PRN [place] Marisa Hua, MD   10 mg at 11/07/16 1623  . insulin aspart  (novoLOG) injection 0-9 Units  0-9 Units Subcutaneous Q6H Anh P Pham, RPH   1 Units at 11/14/16 1219  . iopamidol (ISOVUE-300) 61 % injection 15 mL  15 mL Oral BID PRN Modena Jansky, MD   15 mL at 11/10/16 1153  . lactobacillus acidophilus & bulgar (LACTINEX) chewable tablet 1 tablet  1 tablet Oral TID WC Ronald Lobo, MD      . menthol-cetylpyridinium (CEPACOL) lozenge 3 mg  1 lozenge Oral PRN Modena Jansky, MD      . methocarbamol (ROBAXIN) 500 mg in dextrose 5 % 50 mL IVPB  500 mg Intravenous Q8H PRN [name], PA-C   500 mg at 11/14/16 [id number]  . metoCLOPramide (REGLAN) injection 10 mg  10 mg Intravenous Q6H Brooke Thereasa Distance, PA-C   10 mg at 11/14/16 1219  . morphine 2 MG/ML injection 1 mg  1 mg Intravenous Q4H PRN [name], PA-C   1 mg at 11/13/16 2139  . nebivolol (BYSTOLIC) tablet 5 mg  5 mg Oral Daily Modena Jansky, MD   5 mg at 11/14/16 0930  . ondansetron (ZOFRAN-ODT) disintegrating tablet 4 mg  4 mg Oral Q6H PRN Johnathan Hausen, MD       Or  . ondansetron [facility]) injection 4 mg  4 mg Intravenous Q6H PRN Johnathan Hausen, MD   4 mg at 11/13/16 0941  . pantoprazole (PROTONIX) injection 40 mg  40 mg Intravenous Q24H Modena Jansky, MD   40 mg at 11/13/16 1647  . phenol (CHLORASEPTIC) mouth spray 1 spray  1 spray Mouth/Throat PRN Modena Jansky, MD      . piperacillin-tazobactam (ZOSYN) IVPB 3.375 g  3.375 g Intravenous Q8H Jackolyn Confer, MD   3.375 g at 11/14/16 0930  . potassium chloride 20 MEQ/15ML (10%) solution 20 mEq  20 mEq Oral BID Ronald Lobo, MD      . saccharomyces boulardii (FLORASTOR) capsule 250 mg  250 mg Oral BID Modena Jansky, MD   250 mg at 11/14/16 0931  . simethicone (MYLICON) chewable tablet 80 mg  80 mg Oral QID PRN [name], PA-C   80 mg at 11/13/16 1854  . sodium chloride flush (NS) 0.9 % injection 10-40 mL  10-40 mL Intracatheter PRN Modena Jansky, MD   10 mL at 11/11/16 0946  . TPN (CLINIMIX-E) Adult   Intravenous Continuous  TPN Johnathan Hausen, MD 75 mL/hr at 11/14/16 0600      Allergies as of 10/28/2016 - Review Complete 10/28/2016  Allergen Reaction Noted  . Nsaids  08/18/2015  . Ace inhibitors Other (See Comments) 08/18/2015    Family History  Problem Relation Age of Onset  . Heart disease Mother     before age 6    Social History   Social History  . Marital status: Married    Spouse name: N/A  . Number of children: N/A  . Years of education:  N/A   Occupational History  . Not on file.   Social History Main Topics  . Smoking status: Former Smoker    Quit date: 09/08/1998  . Smokeless tobacco: Never Used  . Alcohol use 0.0 oz/week     Comment: occasional  . Drug use: No  . Sexual activity: Not on file   Other Topics Concern  . Not on file   Social History Narrative  . No narrative on file    Review of Systems: Negative for headache, hoarseness, heartburn or regurgitation or dysphagia, exertional chest pain to suggest angina, dyspnea or chronic cough, skin problems, enlarged lymph nodes, easy bruising, urinary symptoms such as hematuria or dysuria.  Physical Exam: Vital signs in last 24 hours: Temp:  [98.2 F (36.8 C)-99 F (37.2 C)] 98.9 F (37.2 C) (02/06 1400) Pulse Rate:  [75-82] 75 (02/06 1400) Resp:  [16-17] 17 (02/06 1400) BP: (137-150)/(51-60) 137/55 (02/06 1400) SpO2:  [99 %-100 %] 99 % (02/06 1400) Last BM Date: 11/14/16 General:  Alert,  Well-developed, well-nourished, pleasant and cooperative in NAD Head:  Normocephalic and atraumatic. Eyes:  Sclera clear, no icterus.   Conjunctiva pale. Mouth:   No ulcerations or lesions.  Oropharynx pink & moist. Neck:   No masses or thyromegaly. Lungs:  Clear throughout to auscultation.   No wheezes, crackles, or rhonchi. No evident respiratory distress. Heart:   Regular rate and rhythm; no murmurs, clicks, rubs,  or gallops. Abdomen:  Soft, nontender, mild-to-moderately tympanitic in mid abdominal region, minimally distended.  No masses, hepatosplenomegaly or ventral hernias noted. Quiet bowel sounds, without bruits, guarding, or rebound.   Msk:   Symmetrical without gross deformities. Extremities:   Without clubbing, cyanosis, or edema. Neurologic:  Alert and coherent;  grossly normal neurologically. Skin:  Intact without significant lesions or rashes. No palmar pallor. Cervical Nodes:  No significant cervical adenopathy. Psych:   Alert and cooperative. Normal mood and affect.  Intake/Output from previous day: 02/05 0701 - 02/06 0700 In: 2203.8 [I.V.:1998.8; IV Piggyback:205] Out: 1625 [Urine:825; Emesis/NG output:800] Intake/Output this shift: Total I/O In: 696.3 [I.V.:646.3; IV Piggyback:50] Out: 900 [Urine:900]  Lab Results:  Recent Labs  11/13/16 0542 11/14/16 0613  WBC 12.1* 11.6*  HGB 7.8* 7.8*  HCT 24.0* 23.0*  PLT 555* 500*   BMET  Recent Labs  11/12/16 0625 11/13/16 0542 11/14/16 0613  NA 134* 134* 135  K 3.8 4.0 3.9  CL 103 101 102  CO2 26 27 26   GLUCOSE 135* 135* 123*  BUN 15 16 18   CREATININE 0.64 0.61 0.62  CALCIUM 7.8* 7.9* 8.1*   LFT  Recent Labs  11/13/16 0542  PROT 6.0*  ALBUMIN 2.0*  AST 14*  ALT 15  ALKPHOS 116  BILITOT 0.6   PT/INR No results for input(s): LABPROT, INR in the last 72 hours.  Studies/Results: Dg Abd 2 Views  Result Date: 11/12/2016 CLINICAL DATA:  Generalized abdominal pain since cholecystostomy 10/30/2016. Concern for ileus EXAM: ABDOMEN - 2 VIEW COMPARISON:  Abdominal CT from 2 days ago FINDINGS: Unchanged small bowel distention, out of proportion to colonic diameter. Small bowel loops in the left abdomen measure up to 35 mm. No evidence of pneumoperitoneum. No concerning mass effect or gas collection. Cholecystectomy clips. Spinal fixation hardware. Clear lung bases. IMPRESSION: Unchanged small bowel dilatation. Postoperative status favors ileus, although there is notable lack of colonic involvement. No pneumoperitoneum. Electronically  Signed   By: Monte Fantasia M.D.   On: 11/12/2016 20:54   Dg Abd  Portable 1v  Result Date: 11/14/2016 CLINICAL DATA:  Diffuse abdominal pain and distension EXAM: PORTABLE ABDOMEN - 1 VIEW COMPARISON:  Supine and left-side-down decubitus radiographs of November 12, 2016 FINDINGS: There remain loops of moderately distended gas-filled small bowel in the mid abdomen. Only a small amount of gas is noted within a normal calibered colon. There surgical clips in the gallbladder fossa. The patient has undergone posterior and interbody fusion from L3 through S1. There are phleboliths within the pelvis. IMPRESSION: Findings compatible with a mid to distal small bowel obstruction. There is no evidence of perforation. The volume of small bowel gas has not significantly changed since the study of 2 days ago. Electronically Signed   By: David  Martinique M.D.   On: 11/14/2016 08:24    Impression: 1. Recurring nausea, occasional vomiting, small bowel dilatation. Ileus likely, but distal partial small bowel obstruction not excluded. 2. Long-term prophylactic antibiotic therapy because of previous MRSA infection of total knee prosthesis.  Plan: 1. Supplement potassium to bring level up somewhat, in case this is an ileus. Her level has been in the normal range, but slightly toward the lower end of the normal range. I doubt that this is the main issue, but I would like to optimize the chance for intestinal motility. 2. Add bacterial probiotic (already on fungal probiotic) in view of long-term antibiotic therapy. 3. Frequent small aliquots of clear liquids to see if this might stimulate gastrointestinal motility. 4. At some point, perhaps a week from now, if there is no improvement, consideration might have to be given for laparoscopic inspection checking for obstruction due to known adhesions.   LOS: 17 days   [place] V  11/14/2016, 2:42 PM   Pager [phone] If no answer or after 5 PM call [phone]

## 2016-11-14 NOTE — Progress Notes (Signed)
PHARMACY - ADULT TOTAL PARENTERAL NUTRITION CONSULT NOTE   Pharmacy Consult for TPN Indication: prolonged ileus  Patient Measurements: Height: 5\' 3"  (160 cm) Weight: 163 lb 11.2 oz (74.3 kg) (standing scale) IBW/kg (Calculated) : 52.4 TPN AdjBW (KG): 56.9 Body mass index is 29 kg/m. Usual Weight:   Insulin Requirements: no hx DM; 4 units SSI yesterday  Current Nutrition: NPO  IVF: none  Central access: PICC TPN start date: 11/08/16  ASSESSMENT                                                                                                          HPI: Patient is [name] with hx of gallstones presented to the ED on 1/20 with c/o abdominal pain. MRCP on 1/22 showed distended gallbladder with tiny gallstones and wall thickening with suspicion for acute cholecystitis. Patient underwent lap cholecystectomy with intraoperative cholangiogram on 1/22.  To start TPN for prolonged ileus and poor oral intake.  Significant events:  1/22: lap chole with cholangiogram 1/30: abd KUB with suspicion for post-op ileus; NG tube placed  2/2: high NG output from 1/31-2/1 (1358ml) but decreased 2/1-2/2 (300 ml). RN reported foul smell from NG tube --CCS may d/c NG 2/3: trial clamping NG - successful 2/4 NGT removed. Switching from E 5/20 to E 5/15 to better align with nutritional goals 2/6 leave NGT out, continue NPO, GI consult  Today:   Glucose (goal <150): all wnl in past 24 hr  Electrolytes:   Mg wnl but lower than goal (K=4.0, Mag 2.0 goal for pt with ileus)  Na low end of normal, K slightly lower than goal  Other lytes stable wnl  Renal: scr/BUN ok; CrCl 56 ml/min  LFTs: wnl; albumin low  TGs: wnl  Prealbumin: very low   NUTRITIONAL GOALS                                                                                             RD recs (2/2) Kcal:  1750-1950 Protein:  80-90g Fluid:  1.8L/day  Clinimix E 5/15 (switching from E 5/20 at 70 ml/hr) at a goal rate of 75 ml/hr  + 20% fat emulsion at 20 ml/hr x 12 hr to provide: 90 g/day protein, 1758 Kcal/day.  (+++There is currently a Psychologist, prison and probation services of Clinimix solution. Pharmacy will use Clinimix product based on availability+++)  PLAN  Magnesium sulfate 4gm IV x 1  At 1800 today:  continue Clinimix E 5/15 at goal rate of 75 ml/hr.  20% fat emulsion at 20 ml/hr over 12 hrs  Trace elements are on national shortage, will be added as available, adding today  multivitamins will be added daily.  Continue sensitive SSI q6h   Bmet with mag and phos tomorrow am  TPN lab panels on Mondays & Thursdays.  F/u daily.   Dolly Rias RPh 11/14/2016, 9:59 AM Pager [phone]

## 2016-11-15 LAB — BASIC METABOLIC PANEL
ANION GAP: 7 (ref 5–15)
BUN: 17 mg/dL (ref 6–20)
CALCIUM: 7.9 mg/dL — AB (ref 8.9–10.3)
CO2: 24 mmol/L (ref 22–32)
CREATININE: 0.59 mg/dL (ref 0.44–1.00)
Chloride: 105 mmol/L (ref 101–111)
Glucose, Bld: 118 mg/dL — ABNORMAL HIGH (ref 65–99)
Potassium: 4.1 mmol/L (ref 3.5–5.1)
SODIUM: 136 mmol/L (ref 135–145)

## 2016-11-15 LAB — CBC
HCT: 22.3 % — ABNORMAL LOW (ref 36.0–46.0)
HEMOGLOBIN: 7.4 g/dL — AB (ref 12.0–15.0)
MCH: 26.4 pg (ref 26.0–34.0)
MCHC: 33.2 g/dL (ref 30.0–36.0)
MCV: 79.6 fL (ref 78.0–100.0)
Platelets: 456 10*3/uL — ABNORMAL HIGH (ref 150–400)
RBC: 2.8 MIL/uL — AB (ref 3.87–5.11)
RDW: 16.5 % — ABNORMAL HIGH (ref 11.5–15.5)
WBC: 9.9 10*3/uL (ref 4.0–10.5)

## 2016-11-15 LAB — GLUCOSE, CAPILLARY
GLUCOSE-CAPILLARY: 107 mg/dL — AB (ref 65–99)
GLUCOSE-CAPILLARY: 122 mg/dL — AB (ref 65–99)
Glucose-Capillary: 117 mg/dL — ABNORMAL HIGH (ref 65–99)
Glucose-Capillary: 112 mg/dL — ABNORMAL HIGH (ref 65–99)

## 2016-11-15 LAB — PHOSPHORUS: PHOSPHORUS: 3.6 mg/dL (ref 2.5–4.6)

## 2016-11-15 LAB — MAGNESIUM: MAGNESIUM: 2 mg/dL (ref 1.7–2.4)

## 2016-11-15 MED ORDER — MORPHINE SULFATE (PF) 4 MG/ML IV SOLN
1.0000 mg | INTRAVENOUS | Status: DC | PRN
  Administered 2016-11-15 – 2016-11-18 (×9): 1 mg via INTRAVENOUS
  Filled 2016-11-15 (×9): qty 1

## 2016-11-15 MED ORDER — FAT EMULSION 20 % IV EMUL
250.0000 mL | INTRAVENOUS | Status: AC
  Administered 2016-11-15: 250 mL via INTRAVENOUS
  Filled 2016-11-15: qty 250

## 2016-11-15 MED ORDER — TRACE MINERALS CR-CU-MN-SE-ZN 10-1000-500-60 MCG/ML IV SOLN
INTRAVENOUS | Status: AC
  Administered 2016-11-15: 18:00:00 via INTRAVENOUS
  Filled 2016-11-15: qty 1800

## 2016-11-15 NOTE — Progress Notes (Signed)
PHARMACY - ADULT TOTAL PARENTERAL NUTRITION CONSULT NOTE   Pharmacy Consult for TPN Indication: prolonged ileus  Patient Measurements: Height: 5\' 3"  (160 cm) Weight: 163 lb 11.2 oz (74.3 kg) (standing scale) IBW/kg (Calculated) : 52.4 TPN AdjBW (KG): 56.9 Body mass index is 29 kg/m. Usual Weight:   Insulin Requirements: no hx DM; 2 units SSI yesterday  Current Nutrition: NPO  IVF: none  Central access: PICC TPN start date: 11/08/16  ASSESSMENT                                                                                                          HPI: Patient is [name] with hx of gallstones presented to the ED on 1/20 with c/o abdominal pain. MRCP on 1/22 showed distended gallbladder with tiny gallstones and wall thickening with suspicion for acute cholecystitis. Patient underwent lap cholecystectomy with intraoperative cholangiogram on 1/22.  To start TPN for prolonged ileus and poor oral intake.  Significant events:  1/22: lap chole with cholangiogram 1/30: abd KUB with suspicion for post-op ileus; NG tube placed  2/2: high NG output from 1/31-2/1 (1382ml) but decreased 2/1-2/2 (300 ml). RN reported foul smell from NG tube --CCS may d/c NG 2/3: trial clamping NG - successful 2/4 NGT removed. Switching from E 5/20 to E 5/15 to better align with nutritional goals 2/6 leave NGT out, continue NPO, GI consult  Today:   Glucose (goal <150): all wnl in past 24 hr  Electrolytes:   Mg replaced yest, Mg and K at goal  (K=4.0, Mag 2.0 goal for pt with ileus), GI started po KCl  Other lytes stable wnl, CCa WNL  Renal: scr/BUN ok; CrCl 56 ml/min  LFTs: wnl; albumin low  TGs: wnl  Prealbumin: 5.9 (2/5)  NUTRITIONAL GOALS                                                                                             RD recs (2/2) Kcal:  1750-1950 Protein:  80-90g Fluid:  1.8L/day  Clinimix E 5/15 (switching from E 5/20 at 70 ml/hr) at a goal rate of 75 ml/hr + 20% fat  emulsion at 20 ml/hr x 12 hr to provide: 90 g/day protein, 1758 Kcal/day.  (+++There is currently a Psychologist, prison and probation services of Clinimix solution. Pharmacy will use Clinimix product based on availability+++)  PLAN  At 1800 today:  continue Clinimix E 5/15 at goal rate of 75 ml/hr.  20% fat emulsion at 20 ml/hr over 12 hrs  Trace elements are on national shortage, will be added as available, adding today  multivitamins will be added daily.  Continue sensitive SSI q6h   TPN lab panels on Mondays & Thursdays.  F/u daily.   Dolly Rias RPh 11/15/2016, 11:06 AM Pager [phone]

## 2016-11-15 NOTE — Progress Notes (Signed)
Physical Therapy Treatment Patient Details Name: Melanie [name] MRN: [id number] DOB: 05-Nov-1935 Today's Date: 11/15/2016    History of Present Illness 81 y.o. female with medical history significant of gallstones presents to the hospital with the chief complaint of abdominal pain. s/p laparoscopic cholecystectomy 10/30/16. Post op ileus.    PT Comments    Pt reports she's feeling some better. She tolerated increased ambulation distance of 400' today. 6/10 abdominal pain after walking, no pain at rest prior to PT session. Pain meds requested.   Follow Up Recommendations  Home health PT     Equipment Recommendations  Rolling walker with 5" wheels;Other (comment) (shower seat)    Recommendations for Other Services       Precautions / Restrictions Precautions Precautions: Fall Restrictions Weight Bearing Restrictions: No    Mobility  Bed Mobility               General bed mobility comments: OOB in recliner  Transfers Overall transfer level: Needs assistance Equipment used: Rolling walker (2 wheeled) Transfers: Sit to/from Stand Sit to Stand: Min guard         General transfer comment: min/guard for safety, no physical assist needed  Ambulation/Gait Ambulation/Gait assistance: Supervision Ambulation Distance (Feet): 400 Feet Assistive device: Rolling walker (2 wheeled) Gait Pattern/deviations: Step-through pattern;Trunk flexed Gait velocity: decr   General Gait Details: distance limited by pain   Stairs            Wheelchair Mobility    Modified Rankin (Stroke Patients Only)       Balance Overall balance assessment: Needs assistance   Sitting balance-Leahy Scale: Good       Standing balance-Leahy Scale: Fair                      Cognition Arousal/Alertness: Awake/alert Behavior During Therapy: WFL for tasks assessed/performed Overall Cognitive Status: Within Functional Limits for tasks assessed                       Exercises      General Comments        Pertinent Vitals/Pain Pain Score: 6  Pain Location: abdomen Pain Descriptors / Indicators: Aching Pain Intervention(s): Limited activity within patient's tolerance;Monitored during session;Patient requesting pain meds-RN notified    Home Living                      Prior Function            PT Goals (current goals can now be found in the care plan section) Acute Rehab PT Goals Patient Stated Goal: return to church activities PT Goal Formulation: With patient Time For Goal Achievement: 11/16/16 Potential to Achieve Goals: Good Progress towards PT goals: Progressing toward goals    Frequency    Min 3X/week      PT Plan Current plan remains appropriate    Co-evaluation             End of Session Equipment Utilized During Treatment: Gait belt Activity Tolerance: Patient limited by pain Patient left: with call bell/phone within reach;in chair     Time: [id number] PT Time Calculation (min) (ACUTE ONLY): 20 min  Charges:  $Gait Training: 8-22 mins                    G Codes:      Philomena Doheny 11/15/2016, 2:30 PM [phone]

## 2016-11-15 NOTE — Progress Notes (Signed)
Patient ID: Melanie [name], female   DOB: [birth year], 81 y.o.   MRN: [id number]  [facility] Surgery Progress Note  16 Days Post-Op  Subjective: In better spirits today. Denies any current abdominal pain, nausea, or vomiting. Passing flatus and had BM this morning.  Did not like probiotic that was started yesterday.  Objective: Vital signs in last 24 hours: Temp:  [98.1 F (36.7 C)-98.9 F (37.2 C)] 98.2 F (36.8 C) (02/07 0439) Pulse Rate:  [75-76] 76 (02/07 0439) Resp:  [16-17] 16 (02/07 0439) BP: (137-157)/(55-68) 150/68 (02/07 0827) SpO2:  [97 %-100 %] 97 % (02/07 0439) Last BM Date: 11/15/16  Intake/Output from previous day: 02/06 0701 - 02/07 0700 In: 2359.7 [P.O.:180; I.V.:2029.7; IV Piggyback:150] Out: 1400 [Urine:1400] Intake/Output this shift: No intake/output data recorded.  PE: Gen: alert, NAD,pleasant Pulm: CTAB, effort normal Abd: Soft, mild distension, +BS, nontender, incisions C/D/I, no hernias, nontender  Lab Results:   Recent Labs  11/14/16 0613 11/15/16 0651  WBC 11.6* 9.9  HGB 7.8* 7.4*  HCT 23.0* 22.3*  PLT 500* 456*   BMET  Recent Labs  11/14/16 0613 11/15/16 0651  NA 135 136  K 3.9 4.1  CL 102 105  CO2 26 24  GLUCOSE 123* 118*  BUN 18 17  CREATININE 0.62 0.59  CALCIUM 8.1* 7.9*   PT/INR No results for input(s): LABPROT, INR in the last 72 hours. CMP     Component Value Date/Time   NA 136 11/15/2016 0651   K 4.1 11/15/2016 0651   CL 105 11/15/2016 0651   CO2 24 11/15/2016 0651   GLUCOSE 118 (H) 11/15/2016 0651   BUN 17 11/15/2016 0651   CREATININE 0.59 11/15/2016 0651   CALCIUM 7.9 (L) 11/15/2016 0651   PROT 6.0 (L) 11/13/2016 0542   ALBUMIN 2.0 (L) 11/13/2016 0542   AST 14 (L) 11/13/2016 0542   ALT 15 11/13/2016 0542   ALKPHOS 116 11/13/2016 0542   BILITOT 0.6 11/13/2016 0542   GFRNONAA >60 11/15/2016 0651   GFRAA >60 11/15/2016 0651   Lipase     Component Value Date/Time   LIPASE 13 11/11/2016 0945        Studies/Results: Dg Abd Portable 1v  Result Date: 11/14/2016 CLINICAL DATA:  Diffuse abdominal pain and distension EXAM: PORTABLE ABDOMEN - 1 VIEW COMPARISON:  Supine and left-side-down decubitus radiographs of November 12, 2016 FINDINGS: There remain loops of moderately distended gas-filled small bowel in the mid abdomen. Only a small amount of gas is noted within a normal calibered colon. There surgical clips in the gallbladder fossa. The patient has undergone posterior and interbody fusion from L3 through S1. There are phleboliths within the pelvis. IMPRESSION: Findings compatible with a mid to distal small bowel obstruction. There is no evidence of perforation. The volume of small bowel gas has not significantly changed since the study of 2 days ago. Electronically Signed   By: David  Martinique M.D.   On: 11/14/2016 08:24    Anti-infectives: Anti-infectives    Start     Dose/Rate Route Frequency Ordered Stop   11/04/16 0900  piperacillin-tazobactam (ZOSYN) IVPB 3.375 g     3.375 g 12.5 mL/hr over 240 Minutes Intravenous Every 8 hours 11/04/16 0833     10/30/16 1600  piperacillin-tazobactam (ZOSYN) IVPB 3.375 g  Status:  Discontinued     3.375 g 12.5 mL/hr over 240 Minutes Intravenous Every 8 hours 10/30/16 1539 11/01/16 1814   10/29/16 1200  piperacillin-tazobactam (ZOSYN) IVPB 3.375 g  Status:  Discontinued     3.375 g 12.5 mL/hr over 240 Minutes Intravenous Every 8 hours 10/29/16 0941 10/30/16 1539       Assessment/Plan Symptomatic cholelithiasis S/p lap chole with IOC 1/22 Dr. Hassell Done - POD 16 - IOC negative - Patient has had normal postop CT 2/2, HIDA 1/27, CXR 1/26, c diff PCR 1/27, u/a 1/27 - WBC trending down, 9.9 today, afebrile - persistent ileus vs SBO. Appreciate GI recommendations: increase K, add probiotic, frequent small aliquots of clear liquids   HTN COPD GERD- protonix H/o infected knee after TKA - takes daily amoxicillin Malnutrition - prealbumin  improving, 5.9 (2/5)  ID - zosyn 1/21>>1/24, zosyn 1/27>> FEN - IVF, TPN, Frequent small aliquots of clear liquids VTE - SCDs, heparin  Plan - Less abdominal pain this morning. Loose BM this morning. Continue to encourage mobilization. Appreciate GI recommendations: increase K, add probiotic, frequent small aliquots of clear liquids. Continue TPN and reglan.   LOS: 18 days    [name] , [facility] Surgery 11/15/2016, 10:08 AM Pager: [phone] Consults: [phone] Mon-Fri 7:00 am-4:30 pm Sat-Sun 7:00 am-11:30 am

## 2016-11-15 NOTE — Progress Notes (Signed)
Nutrition Follow-up  DOCUMENTATION CODES:   Severe malnutrition in context of acute illness/injury  INTERVENTION:   Monitor magnesium, potassium, and phosphorus daily for at least 3 days, MD to replete as needed, as pt is at risk for refeeding syndrome given severe malnutrition, poor PO intake >11 days, and low Mg levels.  TPN per Pharmacy RD to continue to monitor for diet advancement  NUTRITION DIAGNOSIS:   Malnutrition related to acute illness as evidenced by energy intake < or equal to 50% for > or equal to 5 days, mild depletion of body fat, severe depletion of muscle mass.  Ongoing.  GOAL:   Patient will meet greater than or equal to 90% of their needs  Meeting with TPN.  MONITOR:   PO intake, Diet advancement, Labs, Weight trends, I & O's, Other (Comment) (TPN)  ASSESSMENT:   81 y.o. female with medical history significant of gallstones presents to the hospital with the chief complaint of abdominal pain.  she had been experiencing abdominal pain for 4 weeks prior to this admission, had been diagnosed with gallstones and was scheduled to see general surgery this week. However due to worsening of her symptoms, she presented to the ED on 10/28/16. General surgery was consulted. She was assessed to have symptomatic cholelithiasis and choledocholithiasis. MRCP was negative for obstruction. She underwent laparoscopic cholecystectomy with IOC on 10/30/16 which showed necrotic gallbladder with free intraperitoneal bile, enlarged CBD with free flow into the duodenum.  Per GI note, recommend K supplementation, bacterial probiotic and small amounts of clears to help stimulate GI motility.   NGT is out. TPN to continue. Will monitor for diet advancement.  Medications: Lactinex tablet TID, IV Reglan every 6 hours, IV Protonix daily, KCl solution BID, Florastor BID Labs reviewed: CBGs: 115-117 Mg/Phos WNL  Plan per Pharmacy 2/7: At 1800 today:  continue Clinimix E 5/15 at goal  rate of 75 ml/hr.  20% fat emulsion at 20 ml/hr over 12 hrs  Trace elements are on national shortage, will be added as available, adding today  multivitamins will be added daily.  Diet Order:  TPN (CLINIMIX-E) Adult Diet NPO time specified Except for: Other (See Comments) TPN (CLINIMIX-E) Adult  Skin:  Wound (see comment) (abdominal incision )  Last BM:  2/7  Height:   Ht Readings from Last 1 Encounters:  10/28/16 5\' 3"  (1.6 m)    Weight:   Wt Readings from Last 1 Encounters:  11/08/16 163 lb 11.2 oz (74.3 kg)    Ideal Body Weight:  52.3 kg  BMI:  Body mass index is 29 kg/m.  Estimated Nutritional Needs:   Kcal:  [id number]  Protein:  80-90g  Fluid:  1.8L/day  EDUCATION NEEDS:   Education needs addressed  Clayton Bibles, MS, RD, LDN Pager: [phone] After Hours Pager: [phone]

## 2016-11-15 NOTE — Progress Notes (Signed)
Currently without pain or nausea.  However, frequent small aliquots of clr liq yesterday evening were associated with some increased abd discomfort.  No vomiting.  EXAM:  Looks happier, more comfortable than yesterday.  Abd slt distended, but not as tympanitic.  Some bowel sounds present, but no high-pitched tinkles or rushes.  IMPR:  Still unclear ileus-vs-obstrn  PLAN:  1. Continue K+ supplement, labs tomorrow 2. KUB tomorrow a.m. 3. Continue current 30 mL po q 30' clr liq  Cleotis Nipper, M.D. Pager [phone] If no answer or after 5 PM call [phone]

## 2016-11-16 ENCOUNTER — Inpatient Hospital Stay (HOSPITAL_COMMUNITY): Payer: Medicare Other

## 2016-11-16 LAB — PHOSPHORUS: Phosphorus: 3.7 mg/dL (ref 2.5–4.6)

## 2016-11-16 LAB — COMPREHENSIVE METABOLIC PANEL
ALBUMIN: 2 g/dL — AB (ref 3.5–5.0)
ALT: 10 U/L — ABNORMAL LOW (ref 14–54)
AST: 11 U/L — AB (ref 15–41)
Alkaline Phosphatase: 103 U/L (ref 38–126)
Anion gap: 6 (ref 5–15)
BUN: 17 mg/dL (ref 6–20)
CO2: 25 mmol/L (ref 22–32)
Calcium: 8 mg/dL — ABNORMAL LOW (ref 8.9–10.3)
Chloride: 106 mmol/L (ref 101–111)
Creatinine, Ser: 0.62 mg/dL (ref 0.44–1.00)
GFR calc Af Amer: >60 mL/min (ref >60)
GFR calc non Af Amer: >60 mL/min (ref >60)
GLUCOSE: 116 mg/dL — AB (ref 65–99)
POTASSIUM: 4.2 mmol/L (ref 3.5–5.1)
Sodium: 137 mmol/L (ref 135–145)
Total Bilirubin: 0.5 mg/dL (ref 0.3–1.2)
Total Protein: 5.7 g/dL — ABNORMAL LOW (ref 6.5–8.1)

## 2016-11-16 LAB — MAGNESIUM: Magnesium: 1.7 mg/dL (ref 1.7–2.4)

## 2016-11-16 LAB — GLUCOSE, CAPILLARY
GLUCOSE-CAPILLARY: 110 mg/dL — AB (ref 65–99)
GLUCOSE-CAPILLARY: 109 mg/dL — AB (ref 65–99)
Glucose-Capillary: 113 mg/dL — ABNORMAL HIGH (ref 65–99)

## 2016-11-16 MED ORDER — M.V.I. ADULT IV INJ
INJECTION | INTRAVENOUS | Status: AC
  Administered 2016-11-16: 18:00:00 via INTRAVENOUS
  Filled 2016-11-16: qty 1800

## 2016-11-16 MED ORDER — MAGNESIUM SULFATE IN D5W 1-5 GM/100ML-% IV SOLN
1.0000 g | Freq: Once | INTRAVENOUS | Status: AC
  Administered 2016-11-16: 1 g via INTRAVENOUS
  Filled 2016-11-16: qty 100

## 2016-11-16 MED ORDER — FAT EMULSION 20 % IV EMUL
250.0000 mL | INTRAVENOUS | Status: AC
  Administered 2016-11-16: 250 mL via INTRAVENOUS
  Filled 2016-11-16: qty 250

## 2016-11-16 NOTE — Progress Notes (Addendum)
PHARMACY - ADULT TOTAL PARENTERAL NUTRITION CONSULT NOTE   Pharmacy Consult for TPN Indication: prolonged ileus  Patient Measurements: Height: 5\' 3"  (160 cm) Weight: 163 lb 11.2 oz (74.3 kg) (standing scale) IBW/kg (Calculated) : 52.4 TPN AdjBW (KG): 56.9 Body mass index is 29 kg/m. Usual Weight:   Insulin Requirements: no hx DM; 1 units SSI yesterday  Current Nutrition: NPO (small aliquots of clear liquids)  IVF: none  Central access: PICC TPN start date: 11/08/16  ASSESSMENT                                                                                                          HPI: Patient is [name] with hx of gallstones presented to the ED on 1/20 with c/o abdominal pain. MRCP on 1/22 showed distended gallbladder with tiny gallstones and wall thickening with suspicion for acute cholecystitis. Patient underwent lap cholecystectomy with intraoperative cholangiogram on 1/22.  To start TPN for prolonged ileus and poor oral intake.  Significant events:  1/22: lap chole with cholangiogram 1/30: abd KUB with suspicion for post-op ileus; NG tube placed  2/2: high NG output from 1/31-2/1 (1337ml) but decreased 2/1-2/2 (300 ml). RN reported foul smell from NG tube --CCS may d/c NG 2/3: trial clamping NG - successful 2/4 NGT removed. Switching from E 5/20 to E 5/15 to better align with nutritional goals 2/6 leave NGT out, continue NPO, GI consult 2/8 Xray consistent with ileus or SBO - possible ex lap tomorrow  Today:   Glucose (goal <150): all wnl in past 24 hr  Electrolytes:   Mg low (goal ~2.0 with ileus) after replacement 2/6, K wnl (goal ~4.0 with ileus), GI started po KCl but stopping now as causing significant nausea  Other lytes stable wnl, CCa WNL  Renal: scr/BUN ok; CrCl 56 ml/min  LFTs: low; albumin low  TGs: wnl  Prealbumin: 5.9 (2/5)  NUTRITIONAL GOALS                                                                                             RD recs  (2/2) Kcal:  1750-1950 Protein:  80-90g Fluid:  1.8L/day  Clinimix E 5/15 (switching from E 5/20 at 70 ml/hr) at a goal rate of 75 ml/hr + 20% fat emulsion at 20 ml/hr x 12 hr to provide: 90 g/day protein, 1758 Kcal/day.  (+++There is currently a Psychologist, prison and probation services of Clinimix solution. Pharmacy will use Clinimix product based on availability+++)  PLAN  Now: Magnesium sulfate 1g IV x 1  At 1800 today:  continue Clinimix E 5/15 at goal rate of 75 ml/hr.  20% fat emulsion at 20 ml/hr over 12 hrs  Trace elements are on national shortage, will be added as available, given yesterday so will omit today  multivitamins will be added daily.  Continue sensitive SSI q6h   TPN lab panels on Mondays & Thursdays.  F/u daily.   Peggyann Juba, PharmD, BCPS Pager: [phone] 11/16/2016, 10:03 AM

## 2016-11-16 NOTE — Progress Notes (Signed)
Pt states that since she took her K+ liquid med in pm, her abd has been hurting severely.  Tolerating very little water during the night

## 2016-11-16 NOTE — Progress Notes (Signed)
Patient ID: Melanie [name], female   DOB: 1935-12-20, 81 y.o.   MRN: [id number]  [facility] Surgery Progress Note  17 Days Post-Op  Subjective: Patient states that she was doing ok yesterday. Pain increased over night after taking dose of potassium. She reports increased global abdominal pain. No n/v. She is still passing flatus and having BM's. Tolerated few clear liquids yesterday, but not tried an PO this morning.  Objective: Vital signs in last 24 hours: Temp:  [98.6 F (37 C)-98.8 F (37.1 C)] 98.8 F (37.1 C) (02/08 0501) Pulse Rate:  [70-78] 71 (02/08 0501) Resp:  [16-18] 16 (02/08 0501) BP: (132-158)/(42-84) 132/42 (02/08 0501) SpO2:  [100 %] 100 % (02/08 0501) Last BM Date: 11/15/16  Intake/Output from previous day: 02/07 0701 - 02/08 0700 In: 2268 [P.O.:60; I.V.:2058; IV Piggyback:150] Out: 700 [Urine:700] Intake/Output this shift: No intake/output data recorded.  PE: Gen: alert, NAD,pleasant Pulm: CTAB, effort normal Abd: Soft, increased distension from yesterday, +BS, incisions C/D/I, no hernias, mild tenderness L>R side of abdomen  Lab Results:   Recent Labs  11/14/16 0613 11/15/16 0651  WBC 11.6* 9.9  HGB 7.8* 7.4*  HCT 23.0* 22.3*  PLT 500* 456*   BMET  Recent Labs  11/15/16 0651 11/16/16 0437  NA 136 137  K 4.1 4.2  CL 105 106  CO2 24 25  GLUCOSE 118* 116*  BUN 17 17  CREATININE 0.59 0.62  CALCIUM 7.9* 8.0*   PT/INR No results for input(s): LABPROT, INR in the last 72 hours. CMP     Component Value Date/Time   NA 137 11/16/2016 0437   K 4.2 11/16/2016 0437   CL 106 11/16/2016 0437   CO2 25 11/16/2016 0437   GLUCOSE 116 (H) 11/16/2016 0437   BUN 17 11/16/2016 0437   CREATININE 0.62 11/16/2016 0437   CALCIUM 8.0 (L) 11/16/2016 0437   PROT 5.7 (L) 11/16/2016 0437   ALBUMIN 2.0 (L) 11/16/2016 0437   AST 11 (L) 11/16/2016 0437   ALT 10 (L) 11/16/2016 0437   ALKPHOS 103 11/16/2016 0437   BILITOT 0.5 11/16/2016 0437   GFRNONAA  >60 11/16/2016 0437   GFRAA >60 11/16/2016 0437   Lipase     Component Value Date/Time   LIPASE 13 11/11/2016 0945       Studies/Results: Dg Abd Portable 1v  Result Date: 11/14/2016 CLINICAL DATA:  Diffuse abdominal pain and distension EXAM: PORTABLE ABDOMEN - 1 VIEW COMPARISON:  Supine and left-side-down decubitus radiographs of November 12, 2016 FINDINGS: There remain loops of moderately distended gas-filled small bowel in the mid abdomen. Only a small amount of gas is noted within a normal calibered colon. There surgical clips in the gallbladder fossa. The patient has undergone posterior and interbody fusion from L3 through S1. There are phleboliths within the pelvis. IMPRESSION: Findings compatible with a mid to distal small bowel obstruction. There is no evidence of perforation. The volume of small bowel gas has not significantly changed since the study of 2 days ago. Electronically Signed   By: David  Martinique M.D.   On: 11/14/2016 08:24    Anti-infectives: Anti-infectives    Start     Dose/Rate Route Frequency Ordered Stop   11/04/16 0900  piperacillin-tazobactam (ZOSYN) IVPB 3.375 g     3.375 g 12.5 mL/hr over 240 Minutes Intravenous Every 8 hours 11/04/16 0833     10/30/16 1600  piperacillin-tazobactam (ZOSYN) IVPB 3.375 g  Status:  Discontinued     3.375 g 12.5 mL/hr over  240 Minutes Intravenous Every 8 hours 10/30/16 1539 11/01/16 1814   10/29/16 1200  piperacillin-tazobactam (ZOSYN) IVPB 3.375 g  Status:  Discontinued     3.375 g 12.5 mL/hr over 240 Minutes Intravenous Every 8 hours 10/29/16 0941 10/30/16 1539       Assessment/Plan Symptomatic cholelithiasis S/p lap chole with IOC 1/22 Dr. Hassell Done - POD 17 - IOC negative - Patient has had normal postop CT 2/2, HIDA 1/27, CXR 1/26, c diff PCR 1/27, u/a 1/27 - persistent ileus vs SBO. 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 She is still passing flatus. XR pending this AM. As patient does not seem like she is improving, will discuss timing of possible OR to eval for SBO. Of note, patient states that she did have a prior tubal ligation.    LOS: 19 days    [name] , [facility] Surgery 11/16/2016, 7:52 AM Pager: [phone] Consults: [phone] Mon-Fri 7:00 am-4:30 pm Sat-Sun 7:00 am-11:30 am

## 2016-11-16 NOTE — Progress Notes (Signed)
The patient seems to be having increasing pain. She is currently NPO.  Abdomen is somewhat firm but without overt tenderness, by recollection.  I discussed her case with the attending surgeon, who feels that it is time to explore her surgically, and I agree.   We're approaching 3 weeks since the time of her surgery, and especially with the known presence of pelvic adhesions, it is becoming increasingly difficult to attribute her localized intestinal distention and associated discomfort to an ileus.   Note that the patient's potassium came up nicely with supplementation and is now well in the normal range.   KUB  today is essentially unchanged from 2 days ago.  Will sign off but will be interested to see the intra-operative findings.  Please call if we can be of further assistance with this patient.  Cleotis Nipper, M.D. Pager [phone] If no answer or after 5 PM call [phone]

## 2016-11-17 ENCOUNTER — Encounter (HOSPITAL_COMMUNITY): Payer: Self-pay | Admitting: Registered Nurse

## 2016-11-17 ENCOUNTER — Inpatient Hospital Stay (HOSPITAL_COMMUNITY): Payer: Medicare Other | Admitting: Registered Nurse

## 2016-11-17 ENCOUNTER — Encounter (HOSPITAL_COMMUNITY): Admission: EM | Disposition: A | Discharge status: Skilled Nursing Facility | Payer: Self-pay | Source: Home / Self Care

## 2016-11-17 DIAGNOSIS — K565 Intestinal adhesions [bands], unspecified as to partial versus complete obstruction: Secondary | ICD-10-CM

## 2016-11-17 HISTORY — PX: LAPAROTOMY: SHX154

## 2016-11-17 HISTORY — DX: Intestinal adhesions (bands), unspecified as to partial versus complete obstruction: K56.50

## 2016-11-17 LAB — CBC
HCT: 22.7 % — ABNORMAL LOW (ref 36.0–46.0)
HCT: 26.9 % — ABNORMAL LOW (ref 36.0–46.0)
HEMOGLOBIN: 7.5 g/dL — AB (ref 12.0–15.0)
HEMOGLOBIN: 8.7 g/dL — AB (ref 12.0–15.0)
MCH: 26.7 pg (ref 26.0–34.0)
MCH: 26.3 pg (ref 26.0–34.0)
MCHC: 33 g/dL (ref 30.0–36.0)
MCHC: 32.3 g/dL (ref 30.0–36.0)
MCV: 80.8 fL (ref 78.0–100.0)
MCV: 81.3 fL (ref 78.0–100.0)
PLATELETS: 394 10*3/uL (ref 150–400)
Platelets: 410 10*3/uL — ABNORMAL HIGH (ref 150–400)
RBC: 2.81 MIL/uL — ABNORMAL LOW (ref 3.87–5.11)
RBC: 3.31 MIL/uL — AB (ref 3.87–5.11)
RDW: 17.2 % — AB (ref 11.5–15.5)
RDW: 17.2 % — ABNORMAL HIGH (ref 11.5–15.5)
WBC: 10.9 10*3/uL — ABNORMAL HIGH (ref 4.0–10.5)
WBC: 24.2 10*3/uL — ABNORMAL HIGH (ref 4.0–10.5)

## 2016-11-17 LAB — BASIC METABOLIC PANEL
Anion gap: 22 — ABNORMAL HIGH (ref 5–15)
BUN: 15 mg/dL (ref 6–20)
CO2: 24 mmol/L (ref 22–32)
CREATININE: 0.63 mg/dL (ref 0.44–1.00)
Calcium: 7 mg/dL — ABNORMAL LOW (ref 8.9–10.3)
Chloride: 93 mmol/L — ABNORMAL LOW (ref 101–111)
GFR calc Af Amer: >60 mL/min (ref >60)
GLUCOSE: 152 mg/dL — AB (ref 65–99)
POTASSIUM: 3.9 mmol/L (ref 3.5–5.1)
Sodium: 139 mmol/L (ref 135–145)

## 2016-11-17 LAB — ABO/RH: ABO/RH(D): B POS

## 2016-11-17 LAB — MAGNESIUM: MAGNESIUM: 1.6 mg/dL — AB (ref 1.7–2.4)

## 2016-11-17 LAB — GLUCOSE, CAPILLARY
GLUCOSE-CAPILLARY: 113 mg/dL — AB (ref 65–99)
Glucose-Capillary: 105 mg/dL — ABNORMAL HIGH (ref 65–99)
Glucose-Capillary: 149 mg/dL — ABNORMAL HIGH (ref 65–99)
Glucose-Capillary: 181 mg/dL — ABNORMAL HIGH (ref 65–99)

## 2016-11-17 SURGERY — LAPAROTOMY, EXPLORATORY
Anesthesia: General | Site: Abdomen

## 2016-11-17 MED ORDER — CHLORHEXIDINE GLUCONATE CLOTH 2 % EX PADS: 6.0000 | MEDICATED_PAD | Freq: Once | CUTANEOUS | Status: DC

## 2016-11-17 MED ORDER — MAGNESIUM SULFATE 4 GM/100ML IV SOLN
4.0000 g | Freq: Once | INTRAVENOUS | Status: AC
  Administered 2016-11-17: 4 g via INTRAVENOUS
  Filled 2016-11-17: qty 100

## 2016-11-17 MED ORDER — HYDROMORPHONE HCL 1 MG/ML IJ SOLN
0.2500 mg | INTRAMUSCULAR | Status: DC | PRN
  Administered 2016-11-17 (×2): 0.5 mg via INTRAVENOUS

## 2016-11-17 MED ORDER — EPHEDRINE 5 MG/ML INJ
INTRAVENOUS | Status: AC
  Filled 2016-11-17: qty 10

## 2016-11-17 MED ORDER — HYDROMORPHONE HCL 1 MG/ML IJ SOLN
INTRAMUSCULAR | Status: AC
  Filled 2016-11-17: qty 1

## 2016-11-17 MED ORDER — FENTANYL CITRATE (PF) 100 MCG/2ML IJ SOLN
INTRAMUSCULAR | Status: AC
  Filled 2016-11-17: qty 4

## 2016-11-17 MED ORDER — SUGAMMADEX SODIUM 200 MG/2ML IV SOLN
INTRAVENOUS | Status: AC
  Filled 2016-11-17: qty 2

## 2016-11-17 MED ORDER — LABETALOL HCL 5 MG/ML IV SOLN
5.0000 mg | Freq: Once | INTRAVENOUS | Status: AC
  Administered 2016-11-17: 5 mg via INTRAVENOUS

## 2016-11-17 MED ORDER — DEXAMETHASONE SODIUM PHOSPHATE 10 MG/ML IJ SOLN
INTRAMUSCULAR | Status: AC
  Filled 2016-11-17: qty 1

## 2016-11-17 MED ORDER — HYDROMORPHONE HCL 1 MG/ML IJ SOLN
0.2500 mg | INTRAMUSCULAR | Status: DC | PRN
  Administered 2016-11-17 (×4): 0.5 mg via INTRAVENOUS

## 2016-11-17 MED ORDER — SUCCINYLCHOLINE CHLORIDE 200 MG/10ML IV SOSY
PREFILLED_SYRINGE | INTRAVENOUS | Status: DC | PRN
  Administered 2016-11-17: 120 mg via INTRAVENOUS

## 2016-11-17 MED ORDER — TRACE MINERALS CR-CU-MN-SE-ZN 10-1000-500-60 MCG/ML IV SOLN
INTRAVENOUS | Status: AC
  Administered 2016-11-17: 18:00:00 via INTRAVENOUS
  Filled 2016-11-17: qty 1800

## 2016-11-17 MED ORDER — DEXAMETHASONE SODIUM PHOSPHATE 10 MG/ML IJ SOLN
INTRAMUSCULAR | Status: DC | PRN
  Administered 2016-11-17: 10 mg via INTRAVENOUS

## 2016-11-17 MED ORDER — LIDOCAINE 2% (20 MG/ML) 5 ML SYRINGE
INTRAMUSCULAR | Status: AC
  Filled 2016-11-17: qty 5

## 2016-11-17 MED ORDER — PROPOFOL 10 MG/ML IV BOLUS
INTRAVENOUS | Status: AC
  Filled 2016-11-17: qty 20

## 2016-11-17 MED ORDER — LIDOCAINE 2% (20 MG/ML) 5 ML SYRINGE
INTRAMUSCULAR | Status: DC | PRN
  Administered 2016-11-17: 100 mg via INTRAVENOUS

## 2016-11-17 MED ORDER — ROCURONIUM BROMIDE 50 MG/5ML IV SOSY
PREFILLED_SYRINGE | INTRAVENOUS | Status: AC
  Filled 2016-11-17: qty 5

## 2016-11-17 MED ORDER — ONDANSETRON HCL 4 MG/2ML IJ SOLN
INTRAMUSCULAR | Status: AC
  Filled 2016-11-17: qty 2

## 2016-11-17 MED ORDER — FENTANYL CITRATE (PF) 100 MCG/2ML IJ SOLN
INTRAMUSCULAR | Status: DC | PRN
  Administered 2016-11-17: 50 ug via INTRAVENOUS
  Administered 2016-11-17: 150 ug via INTRAVENOUS

## 2016-11-17 MED ORDER — ROCURONIUM BROMIDE 10 MG/ML (PF) SYRINGE
PREFILLED_SYRINGE | INTRAVENOUS | Status: DC | PRN
  Administered 2016-11-17: 50 mg via INTRAVENOUS

## 2016-11-17 MED ORDER — SUCCINYLCHOLINE CHLORIDE 200 MG/10ML IV SOSY
PREFILLED_SYRINGE | INTRAVENOUS | Status: AC
  Filled 2016-11-17: qty 10

## 2016-11-17 MED ORDER — CEFOTETAN DISODIUM 2 G IJ SOLR
2.0000 g | INTRAMUSCULAR | Status: AC
  Administered 2016-11-17: 2 g via INTRAVENOUS
  Filled 2016-11-17: qty 2

## 2016-11-17 MED ORDER — LACTATED RINGERS IV SOLN
INTRAVENOUS | Status: DC
  Administered 2016-11-17: 10:00:00 via INTRAVENOUS

## 2016-11-17 MED ORDER — MEPERIDINE HCL 50 MG/ML IJ SOLN: 6.2500 mg | INTRAMUSCULAR | Status: DC | PRN

## 2016-11-17 MED ORDER — ONDANSETRON HCL 4 MG/2ML IJ SOLN: 4.0000 mg | Freq: Once | INTRAMUSCULAR | Status: DC | PRN

## 2016-11-17 MED ORDER — FAT EMULSION 20 % IV EMUL
250.0000 mL | INTRAVENOUS | Status: AC
  Administered 2016-11-17: 250 mL via INTRAVENOUS
  Filled 2016-11-17: qty 250

## 2016-11-17 MED ORDER — CEFOTETAN DISODIUM-DEXTROSE 2-2.08 GM-% IV SOLR
INTRAVENOUS | Status: AC
  Filled 2016-11-17: qty 50

## 2016-11-17 MED ORDER — LABETALOL HCL 5 MG/ML IV SOLN
INTRAVENOUS | Status: AC
  Filled 2016-11-17: qty 4

## 2016-11-17 MED ORDER — SUGAMMADEX SODIUM 200 MG/2ML IV SOLN
INTRAVENOUS | Status: DC | PRN
  Administered 2016-11-17: 200 mg via INTRAVENOUS

## 2016-11-17 MED ORDER — LACTATED RINGERS IV SOLN
INTRAVENOUS | Status: DC
  Administered 2016-11-17: 12:00:00 via INTRAVENOUS

## 2016-11-17 MED ORDER — SODIUM CHLORIDE 0.9 % IR SOLN
Status: DC | PRN
  Administered 2016-11-17: 3000 mL

## 2016-11-17 MED ORDER — SODIUM CHLORIDE 0.9 % IV SOLN
Freq: Once | INTRAVENOUS | Status: AC
  Administered 2016-11-17: 07:00:00 via INTRAVENOUS

## 2016-11-17 MED ORDER — EPHEDRINE SULFATE-NACL 50-0.9 MG/10ML-% IV SOSY
PREFILLED_SYRINGE | INTRAVENOUS | Status: DC | PRN
  Administered 2016-11-17: 20 mg via INTRAVENOUS

## 2016-11-17 MED ORDER — PROPOFOL 10 MG/ML IV BOLUS
INTRAVENOUS | Status: DC | PRN
  Administered 2016-11-17: 140 mg via INTRAVENOUS

## 2016-11-17 SURGICAL SUPPLY — 37 items
APPLICATOR COTTON TIP 6IN STRL (MISCELLANEOUS) ×3 IMPLANT
BLADE EXTENDED COATED 6.5IN (ELECTRODE) IMPLANT
BLADE HEX COATED 2.75 (ELECTRODE) ×3 IMPLANT
COVER MAYO STAND STRL (DRAPES) IMPLANT
COVER SURGICAL LIGHT HANDLE (MISCELLANEOUS) ×3 IMPLANT
DRAPE LAPAROSCOPIC ABDOMINAL (DRAPES) ×3 IMPLANT
DRAPE WARM FLUID 44X44 (DRAPE) IMPLANT
DRSG OPSITE POSTOP 4X6 (GAUZE/BANDAGES/DRESSINGS) ×3 IMPLANT
ELECT REM PT RETURN 9FT ADLT (ELECTROSURGICAL) ×3
ELECTRODE REM PT RTRN 9FT ADLT (ELECTROSURGICAL) ×1 IMPLANT
GAUZE SPONGE 4X4 12PLY STRL (GAUZE/BANDAGES/DRESSINGS) ×3 IMPLANT
GLOVE BIOGEL PI IND STRL 7.0 (GLOVE) ×1 IMPLANT
GLOVE BIOGEL PI INDICATOR 7.0 (GLOVE) ×2
GLOVE EUDERMIC 7 POWDERFREE (GLOVE) ×3 IMPLANT
GOWN STRL REUS W/TWL LRG LVL3 (GOWN DISPOSABLE) ×3 IMPLANT
GOWN STRL REUS W/TWL XL LVL3 (GOWN DISPOSABLE) ×6 IMPLANT
HANDLE SUCTION POOLE (INSTRUMENTS) IMPLANT
HEMOSTAT SNOW SURGICEL 2X4 (HEMOSTASIS) ×6 IMPLANT
KIT BASIN OR (CUSTOM PROCEDURE TRAY) ×3 IMPLANT
NS IRRIG 1000ML POUR BTL (IV SOLUTION) ×3 IMPLANT
PACK GENERAL/GYN (CUSTOM PROCEDURE TRAY) ×3 IMPLANT
SPONGE LAP 18X18 X RAY DECT (DISPOSABLE) IMPLANT
STAPLER VISISTAT 35W (STAPLE) ×3 IMPLANT
SUCTION POOLE HANDLE (INSTRUMENTS)
SUT PDS AB 1 CTX 36 (SUTURE) IMPLANT
SUT PDS AB 1 TP1 96 (SUTURE) ×12 IMPLANT
SUT SILK 2 0 (SUTURE)
SUT SILK 2 0 SH CR/8 (SUTURE) IMPLANT
SUT SILK 2-0 18XBRD TIE 12 (SUTURE) IMPLANT
SUT SILK 3 0 (SUTURE)
SUT SILK 3 0 SH CR/8 (SUTURE) IMPLANT
SUT SILK 3-0 18XBRD TIE 12 (SUTURE) IMPLANT
SUT VICRYL 2 0 18  UND BR (SUTURE)
SUT VICRYL 2 0 18 UND BR (SUTURE) IMPLANT
TOWEL OR 17X26 10 PK STRL BLUE (TOWEL DISPOSABLE) ×6 IMPLANT
TRAY FOLEY W/METER SILVER 16FR (SET/KITS/TRAYS/PACK) IMPLANT
YANKAUER SUCT BULB TIP NO VENT (SUCTIONS) IMPLANT

## 2016-11-17 NOTE — Op Note (Signed)
Patient Name:           Melanie [name]   Date of Surgery:        11/17/2016  Pre op Diagnosis:      Small bowel obstruction  Post op Diagnosis:    Adhesive small bowel obstruction  Procedure:                 Exporter laparotomy, lysis of adhesions  Surgeon:                     Edsel Petrin. Dalbert Batman, M.D., FACS  Assistant:                      Excell Seltzer, M.D.   Indication for Assistant: Complex dissection and exposure.  Assistant indicated to reduce incidence of intraoperative and postoperative complications  Operative Indications:   This is a 81 year old 81 American female who presented with acute gangrenous cholecystitis.  On January 22 Dr. Hassell Done took her to the operating room and performed laparoscopic cholecystectomy with cholangiogram.  The glans gram was normal.  Dr. Hassell Done stated that there was some bile peritonitis and that he noticed some adhesions in the pelvis.  Postoperatively the patient never resumed normal bowel function.  She was distended.  This was thought to be an ileus.  She had NG tube off and on.  She was ultimately started on TNA.  When I assumed her care 4 days ago I began to become concerned that she really had a mechanical obstruction.  Her only prior surgery was a lower midline incision from an open tubal ligation many years ago.  She's had extensive workup including hepatobiliary scan, CT scan, infectious workup none of which has been very revealing ...the plain films continued to look like a partial small bowel obstruction.  She was advised to undergo exploration after lengthy discussion she agreed and she is brought to the operating room today  Operative Findings:       The patient had a mechanical small bowel obstruction secondary to adhesions.  The obstruction was due to some chronic adhesions to the lateral pelvic sidewall.  She also had some acute adhesions.  But all the adhesions were taken down I could run the small bowel from the ligament of Treitz to the  ileocecal valve and there was no sign of any tumor or mass or ischemia.  There is no significant serosal injury.  The pelvic floor was somewhat raw and bled a moderate amount during the case.  We controlled this with cautery and with hemostatic sponge in at the end of the case it seemed quite dry.  There was no evidence of exudate.  No gross peritonitis.  NG tube was noted to be in the stomach.  Procedure in Detail:          Following induction of general endotracheal anesthesia the patient's abdomen was prepped and draped in a sterile fashion.  Intravenous antibiotics were given.  Timeout was performed.  Midline incision was made from above the umbilicus to below the umbilicus, probably a total of about 15 cm ultimately to  provide exposure of the pelvis.  The fascia was incised in the midline.  The abdominal cavity was carefully entered.  Omental adhesions were taken down from the anterior abdominal wall.  Small bowel adhesions were taken down on the lateral abdominal wall and pelvic sidewalls.  We ultimately found a transition point with a narrowing of the small bowel with proximal  dilated bowel and distal collapsed bowel with a significant caliber transition.  Small bowel was mobilized up out of the pelvis.  Small bowel was examined carefully from ligament of Treitz to ileocecal valve twice.  The abdomen was copiously irrigated with saline.  We noticed some bleeding in the pelvis and had to spend a little bit of time exposing that cauterizing some raw surfaces.  With cautery and hemostatic sponge the bleeding stopped.  The small bowel and omentum were returned to their anatomic positions.  The midline fascia was closed with a running suture of #1, double strand PDS and skin closed with skin staples.  Clean bandages were placed and the patient taken to PACU in stable condition.  EBL 50-100 mL.  Counts correct.  Complications none.     Edsel Petrin. Dalbert Batman, M.D., FACS General and Minimally Invasive  Surgery Breast and Colorectal Surgery  11/17/2016 11:29 AM

## 2016-11-17 NOTE — Progress Notes (Signed)
PHARMACY - ADULT TOTAL PARENTERAL NUTRITION CONSULT NOTE   Pharmacy Consult for TPN Indication: prolonged ileus  Patient Measurements: Height: 5\' 3"  (160 cm) Weight: 163 lb 11.2 oz (74.3 kg) (standing scale) IBW/kg (Calculated) : 52.4 TPN AdjBW (KG): 56.9 Body mass index is 29 kg/m. Usual Weight:   Insulin Requirements: no hx DM; 0 units SSI yesterday  Current Nutrition: NPO (small aliquots of clear liquids)  IVF: none  Central access: PICC TPN start date: 11/08/16  ASSESSMENT                                                                                                          HPI: Patient is [name] with hx of gallstones presented to the ED on 1/20 with c/o abdominal pain. MRCP on 1/22 showed distended gallbladder with tiny gallstones and wall thickening with suspicion for acute cholecystitis. Patient underwent lap cholecystectomy with intraoperative cholangiogram on 1/22.  To start TPN for prolonged ileus and poor oral intake.  Significant events:  1/22: lap chole with cholangiogram 1/30: abd KUB with suspicion for post-op ileus; NG tube placed  2/2: high NG output from 1/31-2/1 (1325ml) but decreased 2/1-2/2 (300 ml). RN reported foul smell from NG tube --CCS may d/c NG 2/3: trial clamping NG - successful 2/4 NGT removed. Switching from E 5/20 to E 5/15 to better align with nutritional goals 2/6 leave NGT out, continue NPO, GI consult 2/8 Xray consistent with ileus or SBO - possible ex lap tomorrow 2/9: For exploratory laparotomy, lysis of adhesions, possible intestinal resection today  Today:   Glucose (goal <150): all wnl in past 24 hr  Electrolytes:   Na wnl, Cl low  Mg low (goal ~2.0 with ileus) after replacement 2/7, K wnl (goal ~4.0 with ileus), GI started po KCl but stopping now as causing significant nausea  Other lytes stable wnl, CCa WNL  Renal: scr/BUN ok; CrCl 56 ml/min  LFTs: low; albumin low  TGs: wnl  Prealbumin: 5.9 (2/5)  NUTRITIONAL  GOALS                                                                                             RD recs (2/2) Kcal:  1750-1950 Protein:  80-90g Fluid:  1.8L/day  Clinimix E 5/15 (switching from E 5/20 at 70 ml/hr) at a goal rate of 75 ml/hr + 20% fat emulsion at 20 ml/hr x 12 hr to provide: 90 g/day protein, 1758 Kcal/day.  (+++There is currently a Psychologist, prison and probation services of Clinimix solution. Pharmacy will use Clinimix product based on availability+++)  PLAN  Now: Magnesium sulfate 4g IV x 1  At 1800 today:  Continue Clinimix E 5/15 at goal rate of 75 ml/hr.  20% fat emulsion at 20 ml/hr over 12 hrs  Trace elements are on national shortage, will be added as available, omitted yesterday so will give today  Multivitamins will be added daily.  Continue sensitive SSI q6h   TPN lab panels on Mondays & Thursdays.  F/u daily.   Peggyann Juba, PharmD, BCPS Pager: [phone] 11/17/2016, 7:29 AM

## 2016-11-17 NOTE — Progress Notes (Signed)
PT Cancellation Note  Patient Details Name: AILSA MASSAQUOI MRN: [id number] DOB: Apr 24, 1936   Cancelled Treatment:     Exp Lap today.   Rica Koyanagi  PTA [facility]  Acute  Rehab Pager      [phone]

## 2016-11-17 NOTE — Progress Notes (Signed)
Dr Conrad [place] informed BP of 184/67, and pain level of 6/10 after Dilaudid 2 mg.

## 2016-11-17 NOTE — Progress Notes (Signed)
Patient ID: Melanie [name], female   DOB: June 25, 1936, 81 y.o.   MRN: [id number]  I spoke with Dr. Algis Liming from [facility] this morning, who was previously following Melanie [name]. States that he finally heard back from the patient's ID MD, Dr. Melynda Keller in [place] [phone]). 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 She recommends that she remain on suppressant antibiotics once discharged from hospital. She recommends amoxicillin 875mg  qd. States that the infection was not MRSA, but she did not say exactly which bacteria it was. Ok for her to be on zosyn while inpatient, but should transition back to amoxicillin upon discharge.  Perla Echavarria A MILLER

## 2016-11-17 NOTE — Anesthesia Preprocedure Evaluation (Addendum)
Anesthesia Evaluation  Patient identified by MRN, date of birth, ID band Patient awake    Reviewed: Allergy & Precautions, NPO status , Patient's Chart, lab work & pertinent test results  Airway Mallampati: I  TM Distance: >3 FB Neck ROM: Full    Dental   Pulmonary COPD, former smoker,    Pulmonary exam normal        Cardiovascular hypertension, Pt. on medications Normal cardiovascular exam     Neuro/Psych    GI/Hepatic GERD  Medicated and Controlled,  Endo/Other    Renal/GU      Musculoskeletal   Abdominal   Peds  Hematology   Anesthesia Other Findings   Reproductive/Obstetrics                            Anesthesia Physical Anesthesia Plan  ASA: III  Anesthesia Plan: General   Post-op Pain Management:    Induction: Intravenous, Rapid sequence and Cricoid pressure planned  Airway Management Planned: Oral ETT  Additional Equipment:   Intra-op Plan:   Post-operative Plan: Extubation in OR  Informed Consent: I have reviewed the patients History and Physical, chart, labs and discussed the procedure including the risks, benefits and alternatives for the proposed anesthesia with the patient or authorized representative who has indicated his/her understanding and acceptance.     Plan Discussed with: CRNA and Surgeon  Anesthesia Plan Comments:        Anesthesia Quick Evaluation

## 2016-11-17 NOTE — Progress Notes (Signed)
Dr Conrad [place] at bedside. Order received to give additional Dilaudid in pacu, and Labetalol 5 mg.

## 2016-11-17 NOTE — Transfer of Care (Signed)
Immediate Anesthesia Transfer of Care Note  Patient: Melanie [name]  Procedure(s) Performed: Procedure(s): EXPLORATORY LAPAROTOMY WITH LYSIS OF ADHESIONS (N/A)  Patient Location: PACU  Anesthesia Type:General  Level of Consciousness:  sedated, patient cooperative and responds to stimulation  Airway & Oxygen Therapy:Patient Spontanous Breathing and Patient connected to face mask oxgen  Post-op Assessment:  Report given to PACU RN and Post -op Vital signs reviewed and stable  Post vital signs:  Reviewed and stable  Last Vitals:  Vitals:   11/16/16 2126 11/17/16 0624  BP: (!) 156/59 (!) 154/47  Pulse: 72 74  Resp: 18 18  Temp: 0000000 C 37 C    Complications: No apparent anesthesia complications

## 2016-11-17 NOTE — Progress Notes (Signed)
Patient resting in chair.  No complaints of pain.  Patient ambulated in hallway 2 times so far today.  Patient ambulates in room to [facility] and bathroom.

## 2016-11-17 NOTE — Anesthesia Postprocedure Evaluation (Signed)
Anesthesia Post Note  Patient: Melanie [name]  Procedure(s) Performed: Procedure(s) (LRB): EXPLORATORY LAPAROTOMY WITH LYSIS OF ADHESIONS (N/A)  Patient location during evaluation: PACU Anesthesia Type: General Level of consciousness: awake and alert Pain management: pain level controlled Vital Signs Assessment: post-procedure vital signs reviewed and stable Respiratory status: spontaneous breathing, nonlabored ventilation, respiratory function stable and patient connected to nasal cannula oxygen Cardiovascular status: blood pressure returned to baseline and stable Postop Assessment: no signs of nausea or vomiting Anesthetic complications: no       Last Vitals:  Vitals:   11/17/16 1249 11/17/16 1350  BP: (!) 164/73 (!) 173/71  Pulse: (!) 59 64  Resp:  10  Temp: 36.7 C 36.4 C    Last Pain:  Vitals:   11/17/16 1350  TempSrc: Axillary  PainSc:                  Melanie [name] DAVID

## 2016-11-17 NOTE — Progress Notes (Signed)
Patient resting in room this morning.  No complaints of pain.  Daughter in room with patient.  Patient to surgery for exploratory lap @ 0930

## 2016-11-17 NOTE — Progress Notes (Signed)
18 Days Post-Op  Subjective: Patient had problems with pain and nausea during night but premedicated feels better. Still passing tiny volume liquid stools and a little bit of flatus Afebrile.  Heart rate 72.  WBC 10,900.  Hemoglobin 7.5.  She has decided and agreed to proceed with laparotomy today.  Objective: Vital signs in last 24 hours: Temp:  [98.6 F (37 C)-98.9 F (37.2 C)] 98.9 F (37.2 C) (02/08 2126) Pulse Rate:  [70-72] 72 (02/08 2126) Resp:  [18-19] 18 (02/08 2126) BP: (154-156)/(59-72) 156/59 (02/08 2126) SpO2:  [100 %] 100 % (02/08 2126) Last BM Date: 11/15/16  Intake/Output from previous day: 02/08 0701 - 02/09 0700 In: 912.1 [I.V.:762.1; IV Piggyback:150] Out: 700 [Urine:700] Intake/Output this shift: Total I/O In: 760.9 [I.V.:710.9; IV Piggyback:50] Out: 400 [Urine:400]  General appearance: Alert.  Cooperative.  Metal status normal.  Some anxiety but minimal physical distress Resp:  Clear to auscultation bilaterally GI: Abdomen soft.  Minimally distended.  Centrally somewhat diffusely she is tender with some guarding.  Old lower midline scar well-healed.  Recent laparoscopic scars look healthy  Lab Results:   Recent Labs  11/15/16 0651 11/17/16 0327  WBC 9.9 10.9*  HGB 7.4* 7.5*  HCT 22.3* 22.7*  PLT 456* 394   BMET  Recent Labs  11/15/16 0651 11/16/16 0437  NA 136 137  K 4.1 4.2  CL 105 106  CO2 24 25  GLUCOSE 118* 116*  BUN 17 17  CREATININE 0.59 0.62  CALCIUM 7.9* 8.0*   PT/INR No results for input(s): LABPROT, INR in the last 72 hours. ABG No results for input(s): PHART, HCO3 in the last 72 hours.  Invalid input(s): PCO2, PO2  Studies/Results: Dg Abd Portable 1v  Result Date: 11/16/2016 CLINICAL DATA:  Ileus following cholecystectomy. Severe lower abdominal pain. EXAM: PORTABLE ABDOMEN - 1 VIEW COMPARISON:  11/14/2016 FINDINGS: Mild gaseous distention of bowel loops in the abdomen and pelvis, predominantly small bowel loops.  Findings are similar to prior study. No free air organomegaly. Prior cholecystectomy. IMPRESSION: Continued bowel dilatation in the abdomen and pelvis, predominantly small bowel. Findings could reflect small bowel obstruction or ileus. Electronically Signed   By: Rolm Baptise M.D.   On: 11/16/2016 09:12    Anti-infectives: Anti-infectives    Start     Dose/Rate Route Frequency Ordered Stop   11/04/16 0900  piperacillin-tazobactam (ZOSYN) IVPB 3.375 g     3.375 g 12.5 mL/hr over 240 Minutes Intravenous Every 8 hours 11/04/16 0833     10/30/16 1600  piperacillin-tazobactam (ZOSYN) IVPB 3.375 g  Status:  Discontinued     3.375 g 12.5 mL/hr over 240 Minutes Intravenous Every 8 hours 10/30/16 1539 11/01/16 1814   10/29/16 1200  piperacillin-tazobactam (ZOSYN) IVPB 3.375 g  Status:  Discontinued     3.375 g 12.5 mL/hr over 240 Minutes Intravenous Every 8 hours 10/29/16 0941 10/30/16 1539      Assessment/Plan: s/p Procedure(s): LAPAROSCOPIC CHOLECYSTECTOMY WITH INTRAOPERATIVE CHOLANGIOGRAM  Partial SBO.  Plan to proceed with exploratory laparotomy, lysis of adhesions, possible intestinal resection today I discussed the indications, details, techniques, and numerous risk of the surgery with the patient and her family member this morning.  She's aware the risk of bleeding, transfusion, infection, intestinal resection, low but definite possibility of ostomy, injury to adjacent organs requiring major reconstructive surgery, recurrent obstruction, wound hernia, cardiac pulmonary and thromboembolic problems.  She understands these issues well.  At this time all her questions are answered.  She agrees with this plan  Heparin discontinued at this hour  Anemia.  Will type and screen  POD #18.  Laparoscopic cholecystectomy for gangrenous cholecystitis.  Dr. Hassell Done.  January 22.  Lower abdominal adhesions noted at time of surgery.  Bile peritonitis noted at surgery. Postop CT, hydroscan, chest x-ray, C.  difficile, urinalysis all unrevealing  Hypertension COPD GERD-tonics History infected knee after TKA. History MRSA     LOS: 20 days    Melanie [name] M 11/17/2016

## 2016-11-17 NOTE — Anesthesia Procedure Notes (Signed)
Procedure Name: Intubation Date/Time: 11/17/2016 10:17 AM Performed by: Talbot Grumbling Pre-anesthesia Checklist: Patient identified, Emergency Drugs available, Suction available and Patient being monitored Patient Re-evaluated:Patient Re-evaluated prior to inductionOxygen Delivery Method: Circle system utilized Preoxygenation: Pre-oxygenation with 100% oxygen Intubation Type: IV induction Ventilation: Mask ventilation without difficulty Laryngoscope Size: Miller and 2 Grade View: Grade II Tube type: Oral Tube size: 7.0 mm Number of attempts: 1 Airway Equipment and Method: Stylet Placement Confirmation: ETT inserted through vocal cords under direct vision,  positive ETCO2 and breath sounds checked- equal and bilateral Secured at: 21 cm Tube secured with: Tape Dental Injury: Teeth and Oropharynx as per pre-operative assessment

## 2016-11-18 LAB — BASIC METABOLIC PANEL
ANION GAP: 6 (ref 5–15)
BUN: 20 mg/dL (ref 6–20)
CALCIUM: 7.9 mg/dL — AB (ref 8.9–10.3)
CO2: 27 mmol/L (ref 22–32)
Chloride: 103 mmol/L (ref 101–111)
Creatinine, Ser: 0.6 mg/dL (ref 0.44–1.00)
Glucose, Bld: 113 mg/dL — ABNORMAL HIGH (ref 65–99)
Potassium: 3.9 mmol/L (ref 3.5–5.1)
Sodium: 136 mmol/L (ref 135–145)

## 2016-11-18 LAB — CBC
HCT: 22.3 % — ABNORMAL LOW (ref 36.0–46.0)
Hemoglobin: 7.3 g/dL — ABNORMAL LOW (ref 12.0–15.0)
MCH: 26.4 pg (ref 26.0–34.0)
MCHC: 32.7 g/dL (ref 30.0–36.0)
MCV: 80.8 fL (ref 78.0–100.0)
PLATELETS: 322 10*3/uL (ref 150–400)
RBC: 2.76 MIL/uL — ABNORMAL LOW (ref 3.87–5.11)
RDW: 17.4 % — AB (ref 11.5–15.5)
WBC: 21.5 10*3/uL — AB (ref 4.0–10.5)

## 2016-11-18 LAB — GLUCOSE, CAPILLARY
GLUCOSE-CAPILLARY: 109 mg/dL — AB (ref 65–99)
Glucose-Capillary: 125 mg/dL — ABNORMAL HIGH (ref 65–99)
Glucose-Capillary: 112 mg/dL — ABNORMAL HIGH (ref 65–99)
Glucose-Capillary: 103 mg/dL — ABNORMAL HIGH (ref 65–99)

## 2016-11-18 MED ORDER — LACTATED RINGERS IV SOLN
INTRAVENOUS | Status: DC
  Administered 2016-11-18 – 2016-11-26 (×13): via INTRAVENOUS

## 2016-11-18 MED ORDER — MORPHINE SULFATE (PF) 4 MG/ML IV SOLN
1.0000 mg | INTRAVENOUS | Status: DC | PRN
  Administered 2016-11-21 – 2016-11-23 (×14): 1 mg via INTRAVENOUS
  Filled 2016-11-18 (×7): qty 1

## 2016-11-18 MED ORDER — DIPHENHYDRAMINE HCL 12.5 MG/5ML PO ELIX: 12.5000 mg | ORAL_SOLUTION | Freq: Four times a day (QID) | ORAL | Status: DC | PRN

## 2016-11-18 MED ORDER — DIPHENHYDRAMINE HCL 50 MG/ML IJ SOLN: 12.5000 mg | Freq: Four times a day (QID) | INTRAMUSCULAR | Status: DC | PRN

## 2016-11-18 MED ORDER — NALOXONE HCL 0.4 MG/ML IJ SOLN: 0.4000 mg | INTRAMUSCULAR | Status: DC | PRN

## 2016-11-18 MED ORDER — PANTOPRAZOLE SODIUM 40 MG IV SOLR: 40.0000 mg | Freq: Every day | INTRAVENOUS | Status: DC

## 2016-11-18 MED ORDER — ONDANSETRON HCL 4 MG/2ML IJ SOLN: 4.0000 mg | Freq: Four times a day (QID) | INTRAMUSCULAR | Status: DC | PRN

## 2016-11-18 MED ORDER — FAT EMULSION 20 % IV EMUL
250.0000 mL | INTRAVENOUS | Status: AC
  Administered 2016-11-18: 250 mL via INTRAVENOUS
  Filled 2016-11-18: qty 250

## 2016-11-18 MED ORDER — SODIUM CHLORIDE 0.9% FLUSH: 9.0000 mL | INTRAVENOUS | Status: DC | PRN

## 2016-11-18 MED ORDER — MORPHINE SULFATE 2 MG/ML IV SOLN
INTRAVENOUS | Status: DC
  Administered 2016-11-18: 10:00:00 via INTRAVENOUS
  Administered 2016-11-18: 3 mg via INTRAVENOUS
  Administered 2016-11-18: 6 mg via INTRAVENOUS
  Administered 2016-11-18: 1.5 mg via INTRAVENOUS
  Administered 2016-11-19: 3 mg via INTRAVENOUS
  Administered 2016-11-19: 7.5 mg via INTRAVENOUS
  Administered 2016-11-19: 6 mg via INTRAVENOUS
  Administered 2016-11-19: 7.5 mg via INTRAVENOUS
  Administered 2016-11-19: 1.5 mg via INTRAVENOUS
  Administered 2016-11-20: 10.5 mg via INTRAVENOUS
  Administered 2016-11-20: 9.3 mg via INTRAVENOUS
  Administered 2016-11-20: 8 mg via INTRAVENOUS
  Administered 2016-11-20: 1.5 mg via INTRAVENOUS
  Administered 2016-11-20: 6 mg via INTRAVENOUS
  Administered 2016-11-21: 4.5 mg via INTRAVENOUS
  Administered 2016-11-21: 22:00:00 via INTRAVENOUS
  Administered 2016-11-21: 12 mg via INTRAVENOUS
  Filled 2016-11-18 (×3): qty 30

## 2016-11-18 MED ORDER — METOPROLOL TARTRATE 5 MG/5ML IV SOLN: 5.0000 mg | Freq: Four times a day (QID) | INTRAVENOUS | Status: DC

## 2016-11-18 MED ORDER — M.V.I. ADULT IV INJ
INTRAVENOUS | Status: AC
  Administered 2016-11-18: 19:00:00 via INTRAVENOUS
  Filled 2016-11-18: qty 1800

## 2016-11-18 MED ORDER — ENOXAPARIN SODIUM 40 MG/0.4ML ~~LOC~~ SOLN
40.0000 mg | SUBCUTANEOUS | Status: DC
  Administered 2016-11-18 – 2016-12-08 (×20): 40 mg via SUBCUTANEOUS
  Filled 2016-11-18 (×19): qty 0.4

## 2016-11-18 MED ORDER — ONDANSETRON 4 MG PO TBDP: 4.0000 mg | ORAL_TABLET | Freq: Four times a day (QID) | ORAL | Status: DC | PRN

## 2016-11-18 MED ORDER — METOPROLOL TARTRATE 5 MG/5ML IV SOLN
5.0000 mg | Freq: Four times a day (QID) | INTRAVENOUS | Status: DC | PRN
  Administered 2016-11-20: 5 mg via INTRAVENOUS
  Filled 2016-11-18: qty 5

## 2016-11-18 MED ORDER — METHOCARBAMOL 500 MG PO TABS: 500.0000 mg | ORAL_TABLET | Freq: Four times a day (QID) | ORAL | Status: DC | PRN

## 2016-11-18 NOTE — Progress Notes (Signed)
PHARMACY - ADULT TOTAL PARENTERAL NUTRITION CONSULT NOTE   Pharmacy Consult for TPN Indication: prolonged ileus  Patient Measurements: Height: 5\' 3"  (160 cm) Weight: 163 lb 11.2 oz (74.3 kg) (standing scale) IBW/kg (Calculated) : 52.4 TPN AdjBW (KG): 56.9 Body mass index is 29 kg/m. Usual Weight:   Insulin Requirements: 4 units last 24hrs.  Current Nutrition: NPO (small aliquots of clear liquids)  IVF: none  Central access: PICC TPN start date: 11/08/16  ASSESSMENT                                                                                                          HPI: Patient is [name] with hx of gallstones presented to the ED on 1/20 with c/o abdominal pain. MRCP on 1/22 showed distended gallbladder with tiny gallstones and wall thickening with suspicion for acute cholecystitis. Patient underwent lap cholecystectomy with intraoperative cholangiogram on 1/22.  To start TPN for prolonged ileus and poor oral intake.  Significant events:  1/22: lap chole with cholangiogram 1/30: abd KUB with suspicion for post-op ileus; NG tube placed  2/2: high NG output from 1/31-2/1 (1364ml) but decreased 2/1-2/2 (300 ml). RN reported foul smell from NG tube --CCS may d/c NG 2/3: trial clamping NG - successful 2/4 NGT removed. Switching from E 5/20 to E 5/15 to better align with nutritional goals 2/6 leave NGT out, continue NPO, GI consult 2/8 Xray consistent with ileus or SBO - possible ex lap tomorrow 2/9: Underwent exploratory laparotomy, lysis of adhesions.   Today 11/18/16:   Glucose controlled (goal <150). No hx of DM.  Electrolytes as of 2/9(no labs 2/10):   Na wnl, Cl low  Mg low (goal ~2.0 with ileus), given 4g IV on 2/9. K wnl (goal ~4.0 with ileus), GI started po KCl but stopping now as causing significant nausea  Other lytes stable wnl, CCa WNL  Renal: scr/BUN ok; CrCl 56 ml/min  LFTs: low; albumin low  TGs: wnl  Prealbumin: 5.9 (2/5)  NUTRITIONAL GOALS                                                                                              RD recs (2/2) Kcal:  1750-1950 Protein:  80-90g Fluid:  1.8L/day  Clinimix E 5/15 (switching from E 5/20 at 70 ml/hr) at a goal rate of 75 ml/hr + 20% fat emulsion at 20 ml/hr x 12 hr to provide: 90 g/day protein, 1758 Kcal/day.  (+++There is currently a Psychologist, prison and probation services of Clinimix solution. Pharmacy will use Clinimix product based on availability+++)  PLAN  At 1800 today:  Continue Clinimix E 5/15 at goal rate of 75 ml/hr.  20% fat emulsion at 20 ml/hr over 12 hrs  Trace elements are on national shortage, will be added as available, given yesterday so will omit today.  Multivitamins will be added daily.  Continue sensitive SSI q6h   TPN lab panels on Mondays & Thursdays.  Check Bmet and mag on 2/11.  F/u daily.  Romeo Rabon, PharmD, pager [phone]. 11/18/2016,7:34 AM.

## 2016-11-18 NOTE — Progress Notes (Signed)
Dr. Harlow Asa aware via phone pt's BP low with HR 81. Lopressor IV due at this time. Instructed not to give med and order received to change Lopressor to PRN with specific parameters.

## 2016-11-18 NOTE — Progress Notes (Signed)
1 Day Post-Op  Subjective: Still having a lot of abdominal pain  Objective: Vital signs in last 24 hours: Temp:  [97.3 F (36.3 C)-99.5 F (37.5 C)] 99.5 F (37.5 C) (02/10 0542) Pulse Rate:  [58-83] 80 (02/10 0542) Resp:  [8-18] 16 (02/10 0542) BP: (133-184)/(43-90) 133/43 (02/10 0542) SpO2:  [95 %-100 %] 100 % (02/10 0542) Last BM Date: 11/15/16  Intake/Output from previous day: 02/09 0701 - 02/10 0700 In: 1948.8 [I.V.:1863.8; NG/GT:30; IV Piggyback:55] Out: [id number] [Urine:1500; Emesis/NG output:225; Blood:50] Intake/Output this shift: No intake/output data recorded.  Resp: clear to auscultation bilaterally Cardio: regular rate and rhythm GI: soft, moderate tenderness. incision looks good  Lab Results:   Recent Labs  11/17/16 1542 11/18/16 0836  WBC 24.2* 21.5*  HGB 8.7* 7.3*  HCT 26.9* 22.3*  PLT 410* 322   BMET  Recent Labs  11/16/16 0437 11/17/16 0613  NA 137 139  K 4.2 3.9  CL 106 93*  CO2 25 24  GLUCOSE 116* 152*  BUN 17 15  CREATININE 0.62 0.63  CALCIUM 8.0* 7.0*   PT/INR No results for input(s): LABPROT, INR in the last 72 hours. ABG No results for input(s): PHART, HCO3 in the last 72 hours.  Invalid input(s): PCO2, PO2  Studies/Results: No results found.  Anti-infectives: Anti-infectives    Start     Dose/Rate Route Frequency Ordered Stop   11/17/16 0730  cefoTEtan (CEFOTAN) 2 g in dextrose 5 % 50 mL IVPB     2 g 100 mL/hr over 30 Minutes Intravenous On call to O.R. 11/17/16 0717 11/17/16 1040   11/04/16 0900  piperacillin-tazobactam (ZOSYN) IVPB 3.375 g  Status:  Discontinued     3.375 g 12.5 mL/hr over 240 Minutes Intravenous Every 8 hours 11/04/16 0833 11/17/16 0631   10/30/16 1600  piperacillin-tazobactam (ZOSYN) IVPB 3.375 g  Status:  Discontinued     3.375 g 12.5 mL/hr over 240 Minutes Intravenous Every 8 hours 10/30/16 1539 11/01/16 1814   10/29/16 1200  piperacillin-tazobactam (ZOSYN) IVPB 3.375 g  Status:  Discontinued     3.375 g 12.5 mL/hr over 240 Minutes Intravenous Every 8 hours 10/29/16 0941 10/30/16 1539      Assessment/Plan: s/p Procedure(s): EXPLORATORY LAPAROTOMY WITH LYSIS OF ADHESIONS (N/A) Continue ng and bowel rest  OOB Start pca to help with pain control POD 1  LOS: 21 days    TOTH III,PAUL S 11/18/2016

## 2016-11-19 LAB — BASIC METABOLIC PANEL
Anion gap: 5 (ref 5–15)
BUN: 18 mg/dL (ref 6–20)
CHLORIDE: 104 mmol/L (ref 101–111)
CO2: 27 mmol/L (ref 22–32)
CREATININE: 0.59 mg/dL (ref 0.44–1.00)
Calcium: 7.9 mg/dL — ABNORMAL LOW (ref 8.9–10.3)
GFR calc non Af Amer: >60 mL/min (ref >60)
Glucose, Bld: 114 mg/dL — ABNORMAL HIGH (ref 65–99)
Potassium: 3.8 mmol/L (ref 3.5–5.1)
SODIUM: 136 mmol/L (ref 135–145)

## 2016-11-19 LAB — GLUCOSE, CAPILLARY
GLUCOSE-CAPILLARY: 110 mg/dL — AB (ref 65–99)
GLUCOSE-CAPILLARY: 106 mg/dL — AB (ref 65–99)
GLUCOSE-CAPILLARY: 124 mg/dL — AB (ref 65–99)

## 2016-11-19 LAB — MAGNESIUM: MAGNESIUM: 1.5 mg/dL — AB (ref 1.7–2.4)

## 2016-11-19 MED ORDER — TRACE MINERALS CR-CU-MN-SE-ZN 10-1000-500-60 MCG/ML IV SOLN
INTRAVENOUS | Status: AC
  Administered 2016-11-19: 18:00:00 via INTRAVENOUS
  Filled 2016-11-19 (×2): qty 1800

## 2016-11-19 MED ORDER — FAT EMULSION 20 % IV EMUL
250.0000 mL | INTRAVENOUS | Status: AC
  Administered 2016-11-19: 250 mL via INTRAVENOUS
  Filled 2016-11-19: qty 250

## 2016-11-19 MED ORDER — INSULIN ASPART 100 UNIT/ML ~~LOC~~ SOLN
0.0000 [IU] | Freq: Three times a day (TID) | SUBCUTANEOUS | Status: DC
  Administered 2016-11-19 – 2016-11-23 (×11): 1 [IU] via SUBCUTANEOUS
  Administered 2016-11-24: 2 [IU] via SUBCUTANEOUS
  Administered 2016-11-24 – 2016-11-27 (×8): 1 [IU] via SUBCUTANEOUS
  Administered 2016-11-27: 2 [IU] via SUBCUTANEOUS
  Administered 2016-11-27 – 2016-12-05 (×13): 1 [IU] via SUBCUTANEOUS

## 2016-11-19 NOTE — Progress Notes (Signed)
2 Days Post-Op  Subjective: Feels better than yesterday but still looks uncomfortable  Objective: Vital signs in last 24 hours: Temp:  [98.8 F (37.1 C)-99.8 F (37.7 C)] 99.6 F (37.6 C) (02/11 0628) Pulse Rate:  [80-88] 88 (02/11 0628) Resp:  [13-17] 15 (02/11 0628) BP: (148-160)/(40-65) 148/58 (02/11 0628) SpO2:  [99 %-100 %] 99 % (02/11 0628) FiO2 (%):  [28 %-32 %] 28 % (02/11 0400) Last BM Date: 11/15/16  Intake/Output from previous day: 02/10 0701 - 02/11 0700 In: 1218.8 [I.V.:1218.8] Out: 2250 [Urine:1900; Emesis/NG output:350] Intake/Output this shift: No intake/output data recorded.  Resp: clear to auscultation bilaterally Cardio: regular rate and rhythm GI: soft, moderate tenderness. quiet. incision looks good  Lab Results:   Recent Labs  11/17/16 1542 11/18/16 0836  WBC 24.2* 21.5*  HGB 8.7* 7.3*  HCT 26.9* 22.3*  PLT 410* 322   BMET  Recent Labs  11/18/16 0836 11/19/16 0426  NA 136 136  K 3.9 3.8  CL 103 104  CO2 27 27  GLUCOSE 113* 114*  BUN 20 18  CREATININE 0.60 0.59  CALCIUM 7.9* 7.9*   PT/INR No results for input(s): LABPROT, INR in the last 72 hours. ABG No results for input(s): PHART, HCO3 in the last 72 hours.  Invalid input(s): PCO2, PO2  Studies/Results: No results found.  Anti-infectives: Anti-infectives    Start     Dose/Rate Route Frequency Ordered Stop   11/17/16 0730  cefoTEtan (CEFOTAN) 2 g in dextrose 5 % 50 mL IVPB     2 g 100 mL/hr over 30 Minutes Intravenous On call to O.R. 11/17/16 0717 11/17/16 1040   11/04/16 0900  piperacillin-tazobactam (ZOSYN) IVPB 3.375 g  Status:  Discontinued     3.375 g 12.5 mL/hr over 240 Minutes Intravenous Every 8 hours 11/04/16 0833 11/17/16 0631   10/30/16 1600  piperacillin-tazobactam (ZOSYN) IVPB 3.375 g  Status:  Discontinued     3.375 g 12.5 mL/hr over 240 Minutes Intravenous Every 8 hours 10/30/16 1539 11/01/16 1814   10/29/16 1200  piperacillin-tazobactam (ZOSYN) IVPB  3.375 g  Status:  Discontinued     3.375 g 12.5 mL/hr over 240 Minutes Intravenous Every 8 hours 10/29/16 0941 10/30/16 1539      Assessment/Plan: s/p Procedure(s): EXPLORATORY LAPAROTOMY WITH LYSIS OF ADHESIONS (N/A) Continue ng and bowel rest  pca for pain control Ambulate Pod 2 from loa  LOS: 22 days    TOTH III,Kaylem Gidney S 11/19/2016

## 2016-11-19 NOTE — Progress Notes (Addendum)
[facility] NOTE   Pharmacy Consult for TPN Indication: prolonged ileus  Patient Measurements: Height: 5\' 3"  (160 cm) Weight: 163 lb 11.2 oz (74.3 kg) (standing scale) IBW/kg (Calculated) : 52.4 TPN AdjBW (KG): 56.9 Body mass index is 29 kg/m. Usual Weight:   Insulin Requirements: None in the last 24hrs.  Current Nutrition: NPO (small aliquots of clear liquids)  IVF: LR at 46ml/hr  Central access: PICC TPN start date: 11/08/16  ASSESSMENT                                                                                                          HPI: Patient is [name] with hx of gallstones presented to the ED on 1/20 with c/o abdominal pain. MRCP on 1/22 showed distended gallbladder with tiny gallstones and wall thickening with suspicion for acute cholecystitis. Patient underwent lap cholecystectomy with intraoperative cholangiogram on 1/22.  To start TPN for prolonged ileus and poor oral intake.  Significant events:  1/22: lap chole with cholangiogram 1/30: abd KUB with suspicion for post-op ileus; NG tube placed  2/2: high NG output from 1/31-2/1 (1368ml) but decreased 2/1-2/2 (300 ml). RN reported foul smell from NG tube --CCS may d/c NG 2/3: trial clamping NG - successful 2/4 NGT removed. Switching from E 5/20 to E 5/15 to better align with nutritional goals 2/6 leave NGT out, continue NPO, GI consult 2/8 Xray consistent with ileus or SBO - possible ex lap tomorrow 2/9: Underwent exploratory laparotomy, lysis of adhesions.   Today 11/19/16:   Glucose controlled (goal <150). No hx of DM.  Electrolytes as of 2/9(no labs 2/10):   Na wnl  K wnl (goal ~4.0 with ileus).  Mg low (goal ~2.0 with ileus), despite 4g IV on 2/9.   Other lytes stable wnl, CCa WNL  Renal: scr/BUN ok; CrCl 56 ml/min  LFTs: low; albumin low  TGs: wnl  Prealbumin: 5.9 (2/5)  NUTRITIONAL GOALS                                                                                              RD recs (2/2) Kcal:  1750-1950 Protein:  80-90g Fluid:  1.8L/day  Clinimix E 5/15 (switching from E 5/20 at 70 ml/hr) at a goal rate of 75 ml/hr + 20% fat emulsion at 20 ml/hr x 12 hr to provide: 90 g/day protein, 1758 Kcal/day.  (+++There is currently a Psychologist, prison and probation services of Clinimix solution. Pharmacy will use Clinimix product based on availability+++)  PLAN  At 1800 today:  Continue Clinimix E 5/15 at goal rate of 75 ml/hr.  20% fat emulsion at 20 ml/hr over 12 hrs  Trace elements are on national shortage, will be added as available, omitted yesterday so will give today.  Multivitamins will be added daily.  Reduce sensitive SSI to q8h.   TPN lab panels on Mondays & Thursdays.  F/u daily.  Romeo Rabon, PharmD, pager [phone]. 11/19/2016,7:49 AM.

## 2016-11-20 ENCOUNTER — Inpatient Hospital Stay (HOSPITAL_COMMUNITY): Payer: Medicare Other

## 2016-11-20 DIAGNOSIS — R072 Precordial pain: Secondary | ICD-10-CM

## 2016-11-20 DIAGNOSIS — K219 Gastro-esophageal reflux disease without esophagitis: Secondary | ICD-10-CM

## 2016-11-20 DIAGNOSIS — R079 Chest pain, unspecified: Secondary | ICD-10-CM

## 2016-11-20 LAB — COMPREHENSIVE METABOLIC PANEL
ALT: 22 U/L (ref 14–54)
AST: 23 U/L (ref 15–41)
Albumin: 1.8 g/dL — ABNORMAL LOW (ref 3.5–5.0)
Alkaline Phosphatase: 179 U/L — ABNORMAL HIGH (ref 38–126)
Anion gap: 6 (ref 5–15)
BILIRUBIN TOTAL: 0.7 mg/dL (ref 0.3–1.2)
BUN: 17 mg/dL (ref 6–20)
CO2: 28 mmol/L (ref 22–32)
CREATININE: 0.49 mg/dL (ref 0.44–1.00)
Calcium: 8 mg/dL — ABNORMAL LOW (ref 8.9–10.3)
Chloride: 101 mmol/L (ref 101–111)
GFR calc Af Amer: >60 mL/min (ref >60)
GLUCOSE: 121 mg/dL — AB (ref 65–99)
Potassium: 3.7 mmol/L (ref 3.5–5.1)
Sodium: 135 mmol/L (ref 135–145)
TOTAL PROTEIN: 6 g/dL — AB (ref 6.5–8.1)

## 2016-11-20 LAB — DIFFERENTIAL
Basophils Absolute: 0 10*3/uL (ref 0.0–0.1)
Basophils Relative: 0 %
EOS ABS: 0.3 10*3/uL (ref 0.0–0.7)
Eosinophils Relative: 2 %
LYMPHS ABS: 1.3 10*3/uL (ref 0.7–4.0)
Lymphocytes Relative: 8 %
MONO ABS: 1.5 10*3/uL — AB (ref 0.1–1.0)
Monocytes Relative: 9 %
NEUTROS PCT: 81 %
Neutro Abs: 14.1 10*3/uL — ABNORMAL HIGH (ref 1.7–7.7)

## 2016-11-20 LAB — URINALYSIS, ROUTINE W REFLEX MICROSCOPIC
Bilirubin Urine: NEGATIVE
GLUCOSE, UA: NEGATIVE mg/dL
HGB URINE DIPSTICK: NEGATIVE
Ketones, ur: NEGATIVE mg/dL
Leukocytes, UA: NEGATIVE
Nitrite: NEGATIVE
PH: 7 (ref 5.0–8.0)
Protein, ur: NEGATIVE mg/dL
Specific Gravity, Urine: 1.014 (ref 1.005–1.030)

## 2016-11-20 LAB — TYPE AND SCREEN
ABO/RH(D): B POS
ABO/RH(D): B POS
ANTIBODY SCREEN: NEGATIVE
Antibody Screen: NEGATIVE

## 2016-11-20 LAB — GLUCOSE, CAPILLARY
GLUCOSE-CAPILLARY: 145 mg/dL — AB (ref 65–99)
Glucose-Capillary: 129 mg/dL — ABNORMAL HIGH (ref 65–99)
Glucose-Capillary: 135 mg/dL — ABNORMAL HIGH (ref 65–99)

## 2016-11-20 LAB — CBC
HCT: 22.2 % — ABNORMAL LOW (ref 36.0–46.0)
HEMOGLOBIN: 7.2 g/dL — AB (ref 12.0–15.0)
MCH: 26 pg (ref 26.0–34.0)
MCHC: 32.4 g/dL (ref 30.0–36.0)
MCV: 80.1 fL (ref 78.0–100.0)
PLATELETS: 315 10*3/uL (ref 150–400)
RBC: 2.77 MIL/uL — ABNORMAL LOW (ref 3.87–5.11)
RDW: 17.3 % — ABNORMAL HIGH (ref 11.5–15.5)
WBC: 17.3 10*3/uL — ABNORMAL HIGH (ref 4.0–10.5)

## 2016-11-20 LAB — MRSA PCR SCREENING: MRSA by PCR: NEGATIVE

## 2016-11-20 LAB — TROPONIN I
TROPONIN I: 0.05 ng/mL — AB (ref <0.03)
TROPONIN I: 0.05 ng/mL — AB (ref <0.03)
Troponin I: 0.04 ng/mL (ref <0.03)

## 2016-11-20 LAB — PHOSPHORUS: Phosphorus: 3.4 mg/dL (ref 2.5–4.6)

## 2016-11-20 LAB — ECHOCARDIOGRAM COMPLETE
Height: 63 in
Weight: 2619.2 oz

## 2016-11-20 LAB — BRAIN NATRIURETIC PEPTIDE: B Natriuretic Peptide: 201.1 pg/mL — ABNORMAL HIGH (ref 0.0–100.0)

## 2016-11-20 LAB — MAGNESIUM: MAGNESIUM: 1.5 mg/dL — AB (ref 1.7–2.4)

## 2016-11-20 LAB — PREALBUMIN: PREALBUMIN: 8.3 mg/dL — AB (ref 18–38)

## 2016-11-20 LAB — TRIGLYCERIDES: Triglycerides: 69 mg/dL (ref <150)

## 2016-11-20 MED ORDER — PANTOPRAZOLE SODIUM 40 MG IV SOLR
40.0000 mg | Freq: Two times a day (BID) | INTRAVENOUS | Status: DC
  Administered 2016-11-20: 40 mg via INTRAVENOUS
  Filled 2016-11-20: qty 40

## 2016-11-20 MED ORDER — TRACE MINERALS CR-CU-MN-SE-ZN 10-1000-500-60 MCG/ML IV SOLN
INTRAVENOUS | Status: AC
  Administered 2016-11-20: 17:00:00 via INTRAVENOUS
  Filled 2016-11-20 (×2): qty 1800

## 2016-11-20 MED ORDER — FAT EMULSION 20 % IV EMUL
250.0000 mL | INTRAVENOUS | Status: AC
  Administered 2016-11-20: 250 mL via INTRAVENOUS
  Filled 2016-11-20: qty 250

## 2016-11-20 MED ORDER — METOPROLOL TARTRATE 5 MG/5ML IV SOLN
5.0000 mg | Freq: Four times a day (QID) | INTRAVENOUS | Status: DC
  Administered 2016-11-20 – 2016-11-26 (×24): 5 mg via INTRAVENOUS
  Filled 2016-11-20 (×24): qty 5

## 2016-11-20 MED ORDER — NITROGLYCERIN 0.4 MG SL SUBL
0.4000 mg | SUBLINGUAL_TABLET | SUBLINGUAL | Status: DC | PRN
  Administered 2016-11-20 (×7): 0.4 mg via SUBLINGUAL
  Filled 2016-11-20 (×2): qty 1

## 2016-11-20 MED ORDER — NITROGLYCERIN 2 % TD OINT
1.0000 [in_us] | TOPICAL_OINTMENT | Freq: Four times a day (QID) | TRANSDERMAL | Status: DC
  Filled 2016-11-20: qty 30

## 2016-11-20 MED ORDER — POTASSIUM CHLORIDE 2 MEQ/ML IV SOLN
30.0000 meq | Freq: Once | INTRAVENOUS | Status: AC
  Administered 2016-11-20: 30 meq via INTRAVENOUS
  Filled 2016-11-20: qty 15

## 2016-11-20 MED ORDER — IOPAMIDOL (ISOVUE-370) INJECTION 76%
INTRAVENOUS | Status: AC
  Administered 2016-11-20: 100 mL
  Filled 2016-11-20: qty 100

## 2016-11-20 MED ORDER — SODIUM CHLORIDE 0.9 % IJ SOLN
INTRAMUSCULAR | Status: AC
  Filled 2016-11-20: qty 50

## 2016-11-20 MED ORDER — PANTOPRAZOLE SODIUM 40 MG IV SOLR
80.0000 mg | Freq: Two times a day (BID) | INTRAVENOUS | Status: AC
  Administered 2016-11-20 – 2016-11-27 (×13): 80 mg via INTRAVENOUS
  Filled 2016-11-20 (×14): qty 80

## 2016-11-20 MED ORDER — MAGNESIUM SULFATE 4 GM/100ML IV SOLN
4.0000 g | Freq: Once | INTRAVENOUS | Status: AC
  Administered 2016-11-20: 4 g via INTRAVENOUS
  Filled 2016-11-20: qty 100

## 2016-11-20 NOTE — Progress Notes (Signed)
Echocardiogram 2D Echocardiogram has been performed.  Melanie [name] 11/20/2016, 12:50 PM

## 2016-11-20 NOTE — Progress Notes (Signed)
PT Cancellation Note  Patient Details Name: Melanie [name] MRN: [id number] DOB: March 09, 1936   Cancelled Treatment:    Reason Eval/Treat Not Completed: Medical issues which prohibited therapy (pt with new chest pain with elevated troponin, hold PT today per RN request. Will follow. )   Philomena Doheny 11/20/2016, 11:54 AM  [phone]

## 2016-11-20 NOTE — Consult Note (Addendum)
Medical Consultation   Melanie [name]  [id number]  DOB: 02/22/1936   DOA: 10/28/2016 PCP: Osborne Casco, MD   Requesting physician: Dr Zella Richer  Reason for consultation: Chest pain   History of Present Illness: Melanie [name] is an 81 y.o. female 23 days status post uncomplicated laparoscopic cholecystectomy who has been unable to get out of the hospital because of prolonged postop ileus. She underwent on exploratory laparotomy on February 9 due to persistent ileus and small bowel obstruction. We have been consulted for acute onset of chest pain that started yesterday. Patient report pains on and off and have improved with suction. Today pain was more intense stabbing in nature and nonradiating. She denies palpitations and diaphoresis. Also c/o epigastric pain. Patient denies dyspnea and dizziness. Surgical service has consulted for midsternal chest pain, fever and tachycardia.  Review of Systems:  ROS  See history of present illness - all other review negative.   Past Medical History: Past Medical History:  Diagnosis Date  . COPD (chronic obstructive pulmonary disease) ([place])   . Hyperlipidemia   . Hypertension   . Small bowel obstruction due to adhesions 11/17/2016  . Uterine fibroid     Past Surgical History: Past Surgical History:  Procedure Laterality Date  . BACK SURGERY     X2  . bone removed from skull Right 1957  . CHOLECYSTECTOMY N/A 10/30/2016   Procedure: LAPAROSCOPIC CHOLECYSTECTOMY WITH INTRAOPERATIVE CHOLANGIOGRAM;  Surgeon: Johnathan Hausen, MD;  Location: [facility];  Service: General;  Laterality: N/A;  . JOINT REPLACEMENT     knee  . LAPAROTOMY N/A 11/17/2016   Procedure: EXPLORATORY LAPAROTOMY WITH LYSIS OF ADHESIONS;  Surgeon: Fanny Skates, MD;  Location: [facility];  Service: General;  Laterality: N/A;     Allergies:   Allergies  Allergen Reactions  . Nsaids     hypertension  . Ace Inhibitors Other (See Comments)    cough      Social History:  reports that she quit smoking about 18 years ago. She has never used smokeless tobacco. She reports that she drinks alcohol. She reports that she does not use drugs.   Family History: Family History  Problem Relation Age of Onset  . Heart disease Mother     before age 42    Physical Exam: Vitals:   11/20/16 0849 11/20/16 0946 11/20/16 0953 11/20/16 1000  BP:  (!) 205/61 (!) 195/55 (!) 167/50  Pulse:   97 (!) 103  Resp: (!) 28 (!) 24 (!) 27 15  Temp:      TempSrc:      SpO2: 100%  100% 100%  Weight:      Height:        Constitutional: Mild distress Eyes: PERLA  Neck: neck appears normal, no masses, no thyromegaly, no JVD  CVS: S1S2, tachycardia @ 106, SM best heard at the LLSB, tender to palpation at the midsternum. Distal pulses equal  Respiratory:  Good air entry, diffuse ronchi, no wheezing or crackles  Abdomen: Soft, epigastric tenderness, non distended. Surgical incision clean and intact . NGT draining Musculoskeletal: : Mild trace pedal edema  Neuro: Non focal  Psych: judgement and insight appear normal, stable mood and affect, mental status  Data reviewed:  I have personally reviewed following labs and imaging studies Labs:  CBC:  Recent Labs Lab 11/15/16 0651 11/17/16 0327 11/17/16 1542 11/18/16 0836 11/20/16 0441  WBC 9.9 10.9* 24.2* 21.5* 17.3*  NEUTROABS  --   --   --   --  14.1*  HGB 7.4* 7.5* 8.7* 7.3* 7.2*  HCT 22.3* 22.7* 26.9* 22.3* 22.2*  MCV 79.6 80.8 81.3 80.8 80.1  PLT 456* 394 410* 322 [id number]    Basic Metabolic Panel:  Recent Labs Lab 11/14/16 0613 11/15/16 0651 11/16/16 0437 11/17/16 0613 11/18/16 0836 11/19/16 0426 11/20/16 0441  NA 135 136 137 139 136 136 135  K 3.9 4.1 4.2 3.9 3.9 3.8 3.7  CL 102 105 106 93* 103 104 101  CO2 26 24 25 24 27 27 28   GLUCOSE 123* 118* 116* 152* 113* 114* 121*  BUN 18 17 17 15 20 18 17   CREATININE 0.62 0.59 0.62 0.63 0.60 0.59 0.49  CALCIUM 8.1* 7.9* 8.0* 7.0* 7.9* 7.9*  8.0*  MG 1.7 2.0 1.7 1.6*  --  1.5* 1.5*  PHOS 3.9 3.6 3.7  --   --   --  3.4   GFR Estimated Creatinine Clearance: 56 mL/min (by C-G formula based on SCr of 0.49 mg/dL). Liver Function Tests:  Recent Labs Lab 11/16/16 0437 11/20/16 0441  AST 11* 23  ALT 10* 22  ALKPHOS 103 179*  BILITOT 0.5 0.7  PROT 5.7* 6.0*  ALBUMIN 2.0* 1.8*   No results for input(s): LIPASE, AMYLASE in the last 168 hours. No results for input(s): AMMONIA in the last 168 hours. Coagulation profile No results for input(s): INR, PROTIME in the last 168 hours.  Cardiac Enzymes:  Recent Labs Lab 11/20/16 0833  TROPONINI 0.04*   BNP: Invalid input(s): POCBNP CBG:  Recent Labs Lab 11/18/16 2346 11/19/16 0550 11/19/16 1439 11/19/16 2207 11/20/16 0530  GLUCAP 109* 110* 106* 124* 129*    Lipid Profile  Recent Labs  11/20/16 0441  TRIG 69   Urinalysis    Component Value Date/Time   COLORURINE AMBER (A) 11/03/2016 1622   APPEARANCEUR CLEAR 11/03/2016 1622   LABSPEC 1.035 (H) 11/03/2016 1622   PHURINE 5.0 11/03/2016 1622   GLUCOSEU NEGATIVE 11/03/2016 1622   HGBUR NEGATIVE 11/03/2016 1622   BILIRUBINUR SMALL (A) 11/03/2016 1622   KETONESUR NEGATIVE 11/03/2016 1622   PROTEINUR 30 (A) 11/03/2016 1622   NITRITE NEGATIVE 11/03/2016 1622   LEUKOCYTESUR NEGATIVE 11/03/2016 1622    Inpatient Medications:   Scheduled Meds: . enoxaparin (LOVENOX) injection  40 mg Subcutaneous Q24H  . insulin aspart  0-9 Units Subcutaneous Q8H  . magnesium sulfate 1 - 4 g bolus IVPB  4 g Intravenous Once  . morphine   Intravenous Q4H  . nitroGLYCERIN  1 inch Topical Q6H  . pantoprazole (PROTONIX) IV  40 mg Intravenous Q12H  . potassium chloride (KCL MULTIRUN) 30 mEq in 265 mL IVPB  30 mEq Intravenous Once   Continuous Infusions: . Marland KitchenTPN (CLINIMIX-E) Adult     And  . fat emulsion    . lactated ringers 75 mL/hr at 11/19/16 1054  . Marland KitchenTPN (CLINIMIX-E) Adult 75 mL/hr at 11/19/16 1739    Radiological Exams  on Admission: Dg Chest Port 1v Same Day  Result Date: 11/20/2016 CLINICAL DATA:  Chest pain EXAM: PORTABLE CHEST 1 VIEW COMPARISON:  November 03, 2016 FINDINGS: Nasogastric tube tip and side port are below the diaphragm. Central catheter tip is in the superior vena cava. No pneumothorax. There is no edema or consolidation. Heart size and pulmonary vascularity are normal. No adenopathy. IMPRESSION: Tube and catheter positions as described. No evident pneumothorax. No edema or consolidation. Electronically Signed   By: Lowella Grip  III M.D.   On: 11/20/2016 09:03    Impression/Recommendations Chest pain - atypical chest pain, with no EKG changes, mild sinus tachy. Ddx include Pe, long hospital stay with poor mobility, low grade fever, sinus tachy and recent surgery, althought is patient on DVT prophylaxis. Esophageal spasm - High output on NGT in view of hx of GERD. Cardiac in origin, TNI mild elevated but non specific at this time given multiple surgery (? Demand ischemia). Pain improved after 3 NGT, although NGT can help with esophageal spasm as well. Will cycle troponin, serial EKG's, ECHO, and will get CTA. CXR negative for PNA or pneumothorax. Protonix 80 mg IV BID   Small bowel obstruction due to adhesions Necrotic cholecystitis s/p lap cholecystectomy 10/30/2016 Slow progress and complications post op ongoing issues with abdominal pain, poor oral intake and leukocytosis. Was workup for infectious process with no significant signs of infection was treated with Zosyn. Biliary leak was rule out. Status post post exploratory laparoscopy on 11/17/2016. With small bowel obstruction due to adhesions. Continue care per surgical team  Hypertension - exacerbated by pain and patient of her home mediations  Given patient NPO will start metoprolol IV scheduled and continue hydralazine PRN for now  Monitor BP  Follow up ECHO  Hypomagnesemia - replaced, patient on TPN   COPD (chronic obstructive  pulmonary disease) - stable  Continue current management   GERD (gastroesophageal reflux disease) Continue protonix IV   Thank you for this consultation.  Our [facility] hospitalist team will follow the patient with you.   Time Spent: 62 minutes   Chipper Oman M.D. [facility]  11/20/2016, 10:30 AM  Pager please text page via  [email or web address] Password [email or web address]

## 2016-11-20 NOTE — Progress Notes (Signed)
[facility] NP Chaney Malling notified of critical troponin 0.05. No new orders at this time.

## 2016-11-20 NOTE — Progress Notes (Signed)
Report called and given to [facility] RN in ICU Stepdown, nitroglycerin given prior to transport, family updated on patient's status via Zella Richer MD and I Neta Mends RN 9:47 AM 11-20-2016

## 2016-11-20 NOTE — Progress Notes (Signed)
She reports some relief from NTG SL.  Will add some nitropaste for now.  Medicine to see this AM.  Initial look at CXR at beside, was not real concerning.  I don't really see any infiltrates or edema. UA and culture pending she is no SCD and Lovenox for DVT prophylaxis.  Repeat EKG ordered.

## 2016-11-20 NOTE — Progress Notes (Signed)
Patient c/o chest pain. ECG tracing reads NSR.BP rechecked and improved.Marland KitchenRRT notified. See MAR. Message sent to MD.

## 2016-11-20 NOTE — Progress Notes (Signed)
3 Days Post-Op  Subjective: Complaining of mid sternal chest pain  and abdominal pain this AM.  She is febrile, with temp 100.1 Tachycardic 104 at bedrest, 192/57, Sats are 100%, ronchi and she can cough them up.  Sputum I saw in room was clear.  Pain is better now.  No EKG to compare to prior today. EKG shows sinus rhythm with some non specifice ST changes.  Pr 116 ms, QTC 436.  Objective: Vital signs in last 24 hours: Temp:  [98.8 F (37.1 C)-100.3 F (37.9 C)] 100 F (37.8 C) (02/12 0534) Pulse Rate:  [84-96] 96 (02/12 0622) Resp:  [14-18] 18 (02/12 0534) BP: (153-185)/(51-69) 153/56 (02/12 0622) SpO2:  [98 %-100 %] 98 % (02/12 0534) FiO2 (%):  [28 %-36 %] 32 % (02/12 0329) Last BM Date: 11/15/16 NG 1500 Urine 1550 TM 100.3, then afebrile, BP stable CMP OK Mag 1.5 WBC is down, 17.3 H/H stable at 7.2/22 NO films   Intake/Output from previous day: 02/11 0701 - 02/12 0700 In: 2984.3 [I.V.:2984.3] Out: 3050 [Urine:1550; Emesis/NG output:1500] Intake/Output this shift: No intake/output data recorded.  General appearance: alert, cooperative, mild distress and febrile, coughing up clear sputum.  Chest pain with and without cough. Resp: some ronchi has some trouble clearing secretions. GI: soft, not really distedned, NG drainge ongoing.  no flatus Extremities: extremities normal, atraumatic, no cyanosis or edema  Lab Results:   Recent Labs  11/18/16 0836 11/20/16 0441  WBC 21.5* 17.3*  HGB 7.3* 7.2*  HCT 22.3* 22.2*  PLT 322 315    BMET  Recent Labs  11/19/16 0426 11/20/16 0441  NA 136 135  K 3.8 3.7  CL 104 101  CO2 27 28  GLUCOSE 114* 121*  BUN 18 17  CREATININE 0.59 0.49  CALCIUM 7.9* 8.0*   PT/INR No results for input(s): LABPROT, INR in the last 72 hours.   Recent Labs Lab 11/16/16 0437 11/20/16 0441  AST 11* 23  ALT 10* 22  ALKPHOS 103 179*  BILITOT 0.5 0.7  PROT 5.7* 6.0*  ALBUMIN 2.0* 1.8*     Lipase     Component Value Date/Time    LIPASE 13 11/11/2016 0945     Studies/Results: No results found.  Medications: . enoxaparin (LOVENOX) injection  40 mg Subcutaneous Q24H  . insulin aspart  0-9 Units Subcutaneous Q8H  . morphine   Intravenous Q4H  . pantoprazole (PROTONIX) IV  40 mg Intravenous Q24H   . lactated ringers 75 mL/hr at 11/19/16 1054  . Marland KitchenTPN (CLINIMIX-E) Adult 75 mL/hr at 11/19/16 1739    Assessment/Plan Admitted 1/20 - Medicine S/p   Laparoscopic Cholecystectomy with intraoperative cholangiogram, 10/30/16, Dr. Johnathan Hausen; Necrotic gallbladder with free intrapertioneal bile; enlarged CBD with free flow into the duodenum  POD 21 CT 1/25 - ? Ileus, no abscess/ HIDA 11/04/16 - no leak/ CT 11/10/16: fluid in GB fossa, no abscess - ileus/  NG placed 1/30 Exploratory laparotomy with LOA - findings:  Mechanical obstruction, 11/17/16, Dr. Dalbert Batman  POD 3 Anemia  Malnutrition - prealbumin 8.3  Hypertension COPD GERD-tonics History infected knee after TKA. History MRSA FEN:  IV fluids/NPO ID: Zosyn 1/21- 11/16/16 Cefotetan 1 preop dose 11/17/16 DVT  Plan:  Nitro to see if that helps, PPI, CXR, Trop,  type and screen Mag being replaced, I have ask Medicine to see.  transfer to step down unit for cardiac monitoring, and rule out.    LOS: 23 days    Melanie [name] 11/20/2016 [phone]

## 2016-11-20 NOTE — Progress Notes (Signed)
Date:  November 20, 2016 Chart reviewed for concurrent status and case management needs. Will continue to follow patient progress. Transferred back to [facility]du due to chest pain, and temp. Discharge Planning: following for needs Expected discharge date: [id number] Velva Harman, BSN, [place], [place]

## 2016-11-20 NOTE — Progress Notes (Signed)
PHARMACY - ADULT TOTAL PARENTERAL NUTRITION CONSULT NOTE   Pharmacy Consult for TPN Indication: prolonged ileus  Patient Measurements: Height: 5\' 3"  (160 cm) Weight: 163 lb 11.2 oz (74.3 kg) (standing scale) IBW/kg (Calculated) : 52.4 TPN AdjBW (KG): 56.9 Body mass index is 29 kg/m. Usual Weight:   Insulin Requirements: 2 units in the last 24hrs.  Current Nutrition: NPO (small aliquots of clear liquids)  IVF: none  Central access: PICC TPN start date: 11/08/16  ASSESSMENT                                                                                                          HPI: Patient is [name] with hx of gallstones presented to the ED on 1/20 with c/o abdominal pain. MRCP on 1/22 showed distended gallbladder with tiny gallstones and wall thickening with suspicion for acute cholecystitis. Patient underwent lap cholecystectomy with intraoperative cholangiogram on 1/22.  To start TPN for prolonged ileus and poor oral intake.  Significant events:  1/22: lap chole with cholangiogram 1/30: abd KUB with suspicion for post-op ileus; NG tube placed  2/2: high NG output from 1/31-2/1 (13104ml) but decreased 2/1-2/2 (300 ml). RN reported foul smell from NG tube --CCS may d/c NG 2/3: trial clamping NG - successful 2/4 NGT removed. Switching from E 5/20 to E 5/15 to better align with nutritional goals 2/6 leave NGT out, continue NPO, GI consult 2/8 Xray consistent with ileus or SBO - possible ex lap tomorrow 2/9: Underwent exploratory laparotomy, lysis of adhesions.   Today 11/20/16:   Glucose controlled (goal <150). No hx of DM.  Electrolytes as of 2/9(no labs 2/10):   Na wnl  K wnl (goal ~4.0 with ileus).  Mg low (goal ~2.0 with ileus)  Other lytes stable wnl, CCa WNL  Renal: scr/BUN ok; CrCl 49 ml/min  LFTs: low; albumin low  TGs: wnl  Prealbumin: 5.9 (2/5), 8.3 (2/12)  NUTRITIONAL GOALS                                                                                              RD recs (2/2) Kcal:  1750-1950 Protein:  80-90g Fluid:  1.8L/day  Clinimix E 5/15 (switching from E 5/20 at 70 ml/hr) at a goal rate of 75 ml/hr + 20% fat emulsion at 20 ml/hr x 12 hr to provide: 90 g/day protein, 1758 Kcal/day.  (+++There is currently a Psychologist, prison and probation services of Clinimix solution. Pharmacy will use Clinimix product based on availability+++)  PLAN  Magnesium sulfate 4gm IV x 1, KCl 60meq IV x 1 At 1800 today:  Continue Clinimix E 5/15 at goal rate of 75 ml/hr.  20% fat emulsion at 20 ml/hr over 12 hrs  Trace elements are on national shortage, will be added as available, will give today.  Multivitamins will be added daily.  continue sensitive SSI  q8h.   BMet with mag and phos in am  TPN lab panels on Mondays & Thursdays.  F/u daily.  Dolly Rias RPh 11/20/2016, 8:29 AM Pager [phone]

## 2016-11-21 DIAGNOSIS — R1011 Right upper quadrant pain: Secondary | ICD-10-CM

## 2016-11-21 LAB — URINE CULTURE: Culture: NO GROWTH

## 2016-11-21 LAB — BASIC METABOLIC PANEL
Anion gap: 6 (ref 5–15)
BUN: 19 mg/dL (ref 6–20)
CO2: 25 mmol/L (ref 22–32)
CREATININE: 0.44 mg/dL (ref 0.44–1.00)
Calcium: 8 mg/dL — ABNORMAL LOW (ref 8.9–10.3)
Chloride: 102 mmol/L (ref 101–111)
GFR calc Af Amer: >60 mL/min (ref >60)
Glucose, Bld: 132 mg/dL — ABNORMAL HIGH (ref 65–99)
Potassium: 4 mmol/L (ref 3.5–5.1)
SODIUM: 133 mmol/L — AB (ref 135–145)

## 2016-11-21 LAB — CBC
HCT: 22.8 % — ABNORMAL LOW (ref 36.0–46.0)
Hemoglobin: 7.5 g/dL — ABNORMAL LOW (ref 12.0–15.0)
MCH: 26 pg (ref 26.0–34.0)
MCHC: 32.9 g/dL (ref 30.0–36.0)
MCV: 79.2 fL (ref 78.0–100.0)
PLATELETS: 352 10*3/uL (ref 150–400)
RBC: 2.88 MIL/uL — ABNORMAL LOW (ref 3.87–5.11)
RDW: 17.6 % — ABNORMAL HIGH (ref 11.5–15.5)
WBC: 19.2 10*3/uL — ABNORMAL HIGH (ref 4.0–10.5)

## 2016-11-21 LAB — GLUCOSE, CAPILLARY
GLUCOSE-CAPILLARY: 131 mg/dL — AB (ref 65–99)
Glucose-Capillary: 137 mg/dL — ABNORMAL HIGH (ref 65–99)
Glucose-Capillary: 137 mg/dL — ABNORMAL HIGH (ref 65–99)

## 2016-11-21 LAB — MAGNESIUM: MAGNESIUM: 2 mg/dL (ref 1.7–2.4)

## 2016-11-21 LAB — TROPONIN I: Troponin I: 0.05 ng/mL (ref <0.03)

## 2016-11-21 LAB — PHOSPHORUS: Phosphorus: 3.6 mg/dL (ref 2.5–4.6)

## 2016-11-21 MED ORDER — FAT EMULSION 20 % IV EMUL
250.0000 mL | INTRAVENOUS | Status: AC
  Administered 2016-11-21: 250 mL via INTRAVENOUS
  Filled 2016-11-21: qty 250

## 2016-11-21 MED ORDER — NITROGLYCERIN 0.4 MG SL SUBL
0.4000 mg | SUBLINGUAL_TABLET | SUBLINGUAL | Status: DC | PRN
  Administered 2016-11-21: 0.4 mg via SUBLINGUAL

## 2016-11-21 MED ORDER — M.V.I. ADULT IV INJ
INTRAVENOUS | Status: AC
  Administered 2016-11-21: 17:00:00 via INTRAVENOUS
  Filled 2016-11-21 (×3): qty 1800

## 2016-11-21 MED ORDER — HYDRALAZINE HCL 20 MG/ML IJ SOLN
10.0000 mg | Freq: Once | INTRAMUSCULAR | Status: AC
  Administered 2016-11-21: 10 mg via INTRAVENOUS
  Filled 2016-11-21: qty 1

## 2016-11-21 NOTE — Progress Notes (Signed)
PT Cancellation Note  Patient Details Name: [name] MRN: [id number] DOB: [birth year]   Cancelled Treatment:    Reason Eval/Treat Not Completed: Fatigue/lethargy limiting ability to participate. Will check back  Another time.   [name] 11/21/2016, 9:17 AM Tresa Endo PT [phone]

## 2016-11-21 NOTE — Progress Notes (Signed)
Assessment S/p Laparoscopic Cholecystectomy with intraoperative cholangiogram, 10/30/16, Dr. Johnathan Hausen; Necrotic gallbladder with free intrapertioneal bile; enlarged CBD with free flow into the duodenum    Postop SBO s/p Exploratory laparotomy with LOA - findings:  Mechanical obstruction, 11/17/16, Dr. Dalbert Batman  POD 4-has postop ileus now Anemia  PC Malnutrition - prealbumin 8.3; on TPN Hypertension COPD   Chest pain-slight elevation of troponin.  ECHO done yesterday. Chest CT negative for infiltrate or PE.   Plan:  Keep in ICU. Await return of bowel function and ECHO results.   LOS: 24 days     4 Days Post-Op  Subjective: Some having some abdominal and lower chest pain.  Objective: Vital signs in last 24 hours: Temp:  [98.6 F (37 C)-100.6 F (38.1 C)] 98.8 F (37.1 C) (02/13 0359) Pulse Rate:  [80-110] 84 (02/13 0600) Resp:  [11-28] 13 (02/13 0600) BP: (165-213)/(46-65) 178/52 (02/13 0600) SpO2:  [95 %-100 %] 95 % (02/13 0600) FiO2 (%):  [34 %] 34 % (02/12 0849) Weight:  [76.4 kg (168 lb 6.9 oz)] 76.4 kg (168 lb 6.9 oz) (02/12 0946) Last BM Date: 11/15/16  Intake/Output from previous day: 02/12 0701 - 02/13 0700 In: 4365.7 [I.V.:3900.7; IV Piggyback:465] Out: 2325 [id number]; Emesis/NG output:850] Intake/Output this shift: No intake/output data recorded.  PE: General- In NAD.  NG in with bilious output Abdomen-soft, incision is clean and intact, hypoactive bowel sounds  Lab Results:   Recent Labs  11/20/16 0441 11/21/16 0430  WBC 17.3* 19.2*  HGB 7.2* 7.5*  HCT 22.2* 22.8*  PLT 315 352   BMET  Recent Labs  11/20/16 0441 11/21/16 0430  NA 135 133*  K 3.7 4.0  CL 101 102  CO2 28 25  GLUCOSE 121* 132*  BUN 17 19  CREATININE 0.49 0.44  CALCIUM 8.0* 8.0*   PT/INR No results for input(s): LABPROT, INR in the last 72 hours. Comprehensive Metabolic Panel:    Component Value Date/Time   NA 133 (L) 11/21/2016 0430   NA 135 11/20/2016 0441   K  4.0 11/21/2016 0430   K 3.7 11/20/2016 0441   CL 102 11/21/2016 0430   CL 101 11/20/2016 0441   CO2 25 11/21/2016 0430   CO2 28 11/20/2016 0441   BUN 19 11/21/2016 0430   BUN 17 11/20/2016 0441   CREATININE 0.44 11/21/2016 0430   CREATININE 0.49 11/20/2016 0441   GLUCOSE 132 (H) 11/21/2016 0430   GLUCOSE 121 (H) 11/20/2016 0441   CALCIUM 8.0 (L) 11/21/2016 0430   CALCIUM 8.0 (L) 11/20/2016 0441   AST 23 11/20/2016 0441   AST 11 (L) 11/16/2016 0437   ALT 22 11/20/2016 0441   ALT 10 (L) 11/16/2016 0437   ALKPHOS 179 (H) 11/20/2016 0441   ALKPHOS 103 11/16/2016 0437   BILITOT 0.7 11/20/2016 0441   BILITOT 0.5 11/16/2016 0437   PROT 6.0 (L) 11/20/2016 0441   PROT 5.7 (L) 11/16/2016 0437   ALBUMIN 1.8 (L) 11/20/2016 0441   ALBUMIN 2.0 (L) 11/16/2016 0437     Studies/Results: Ct Angio Chest Pe W Or Wo Contrast  Result Date: 11/20/2016 CLINICAL DATA:  Acute onset chest pain yesterday. Patient status post laparoscopic cholecystectomy 10/30/2016. EXAM: CT ANGIOGRAPHY CHEST WITH CONTRAST TECHNIQUE: Multidetector CT imaging of the chest was performed using the standard protocol during bolus administration of intravenous contrast. Multiplanar CT image reconstructions and MIPs were obtained to evaluate the vascular anatomy. CONTRAST:  100 cc Isovue 370. COMPARISON:  PA and lateral chest 10/28/2016. Single-view of  the chest 11/20/2016. FINDINGS: Cardiovascular: No pulmonary embolus is identified. Heart size is enlarged. No pericardial effusion. Calcific aortic and coronary atherosclerosis is identified. Mediastinum/Nodes: No enlarged mediastinal, hilar, or axillary lymph nodes. Thyroid gland, trachea, and esophagus demonstrate no significant findings. Lungs/Pleura: No pleural effusion. Lungs demonstrate centrilobular emphysematous disease. Mild dependent atelectasis is seen. No consolidative process, nodule or mass. Upper Abdomen: Negative. Musculoskeletal: No acute or focal bony abnormality.  Diffuse subcutaneous edema is seen. Review of the MIP images confirms the above findings. IMPRESSION: Negative for pulmonary embolus or acute disease. Emphysema. Atherosclerosis. Body wall edema compatible with anasarca. Electronically Signed   By: Inge Rise M.D.   On: 11/20/2016 15:50   Dg Chest Port 1v Same Day  Result Date: 11/20/2016 CLINICAL DATA:  Chest pain EXAM: PORTABLE CHEST 1 VIEW COMPARISON:  November 03, 2016 FINDINGS: Nasogastric tube tip and side port are below the diaphragm. Central catheter tip is in the superior vena cava. No pneumothorax. There is no edema or consolidation. Heart size and pulmonary vascularity are normal. No adenopathy. IMPRESSION: Tube and catheter positions as described. No evident pneumothorax. No edema or consolidation. Electronically Signed   By: Lowella Grip III M.D.   On: 11/20/2016 09:03    Anti-infectives: Anti-infectives    Start     Dose/Rate Route Frequency Ordered Stop   11/17/16 0730  cefoTEtan (CEFOTAN) 2 g in dextrose 5 % 50 mL IVPB     2 g 100 mL/hr over 30 Minutes Intravenous On call to O.R. 11/17/16 0717 11/17/16 1040   11/04/16 0900  piperacillin-tazobactam (ZOSYN) IVPB 3.375 g  Status:  Discontinued     3.375 g 12.5 mL/hr over 240 Minutes Intravenous Every 8 hours 11/04/16 0833 11/17/16 0631   10/30/16 1600  piperacillin-tazobactam (ZOSYN) IVPB 3.375 g  Status:  Discontinued     3.375 g 12.5 mL/hr over 240 Minutes Intravenous Every 8 hours 10/30/16 1539 11/01/16 1814   10/29/16 1200  piperacillin-tazobactam (ZOSYN) IVPB 3.375 g  Status:  Discontinued     3.375 g 12.5 mL/hr over 240 Minutes Intravenous Every 8 hours 10/29/16 0941 10/30/16 1539       Debraann Livingstone J 11/21/2016

## 2016-11-21 NOTE — Progress Notes (Signed)
CONSULT NOTE    Melanie [name]  [id number] DOB: Dec 21, 1935 DOA: 10/28/2016 PCP: Osborne Casco, MD   Brief Narrative:  Melanie [name] is a 81 y.o. female who initially presented to the ED on 10/28/16 with RUQ abdominal pain. She underwent MRCP which was negative for obstruction. She underwent laparoscopic cholecystectomy on 10/30/16 and was noted to have a necrotic gallbladder with free intraperitoneal bile. Her post-op course was complicated by increased pain and leukocytosis. Infectious work-up was unremarkable. She underwent CT abd on 1/25 that showed an ileus. She underwent HIDA scan on 1/27, which ruled out bile leak. On 1/30, she developed worsening abdominal pain again and had a KUB that showed post-op ileus. NGT was placed. TPN was started on 1/31 due to prolonged ileus with inadequate oral intake. CT abdomen was repeated on 2/3 and did not show any cause of her ileus. NG was removed 2/4. GI was consulted on 2/6 for prolonged post-op ileus, who recommended probiotic and frequent small aliquots of clear liquids. Repeat KUB on 2/8 was consistent with ileus or SBO. She underwent exploratory laparotomy on 2/9 and was found to have mechanical SBO 2/2 to pelvic adhesions. On 2/12, Pt began complaining of mid-sternal chest pain and TRH was re-consulted. EKG with non-specific ST changes, trops mildly elevated. CT chest negative for infiltrate or PE. ECHO with EF 60-65%, G1DD.   Assessment & Plan:   Principal Problem:   Small bowel obstruction due to adhesions Active Problems:   Necrotic cholecystitis s/p lap cholecystectomy 10/30/2016   Essential hypertension   Hyperlipidemia   COPD (chronic obstructive pulmonary disease) (HCC)   GERD (gastroesophageal reflux disease)   Chest pain  S/p laparoscopic cholecystectomy: 10/30/16 with Dr. Hassell Done, found to have necrotic gallbladder with free intraperitoneal bile. - Post-op management per surgery  Post-op mechanical SBO s/p exploratory  laparotomy with LOA: 11/17/16 with Dr. Dalbert Batman. POD #4. - Management per surgical team  Atypical Chest Pain: Started on 2/12. Pain is substernal and "crampy" in nature and "feels like spasms". Occurred after NG tube was placed. Improved with Nitro. May be secondary to esophageal spasm. Cardiac etiology less likely with mildly elevated and flattened troponins, EKG with non-specific ST changes but no true ST depressions or elevations. CXR negative. ECHO with EF 60-65% and G1DD. BNP 201. - Continue IV PPI and nitro prn  Leukocytosis: WBC increased from 17.3 > 19.2. No signs of infection. - Monitor  Hypertension: Likely worsened by pain.  - Continue Hydralazine 10mg  q4hrs prn - Metoprolol 5mg  scheduled every 6 hours - Add Diltiazem for blood pressure and for possible esophageal spasm.  COPD: Stable. - Monitor  Anemia: Hgb steadily trending down from 11.9 on admission to 7.5 this morning. No signs of active bleeding. - Monitor - Transfuse for Hgb < 7   DVT prophylaxis: Lovenox Code Status: Full Family Communication: Daughter present at bedside, updated on plan Disposition Plan: Per surgical team   Procedures:   Laparoscopic cholecystectomy 10/30/16  Exploratory laparotomy with LOA 11/17/16  ECHO 2/12- EF 60-65%, G1DD.  NG (1/30-2/4, 2/9- )  Antimicrobials: (specify start and planned stop date. Auto populated tables are space occupying and do not give end dates)  Zosyn (1/20-1/24, 1/27-2/9)   Subjective: Pt states she has continued to have chest pain since the NG tube was placed. The chest pain comes and goes. The chest pain feels "crampy" and "like spasms". Nothing makes the chest pain worse. The chest pain is better with nitroglycerin. She also states her abdominal pain  is better.  Objective: Vitals:   11/21/16 0800 11/21/16 0832 11/21/16 1000 11/21/16 1033  BP: (!) 167/87  (!) 187/51 (!) 187/51  Pulse: 93  96   Resp: (!) 22 14 (!) 22   Temp: 98.3 F (36.8 C)     TempSrc:  Oral     SpO2: 97% 97% 96%   Weight:      Height:        Intake/Output Summary (Last 24 hours) at 11/21/16 1144 Last data filed at 11/21/16 1000  Gross per 24 hour  Intake          3879.41 ml  Output             2000 ml  Net          1879.41 ml   Filed Weights   10/28/16 0551 11/08/16 1347 11/20/16 0946  Weight: 155 lb (70.3 kg) 163 lb 11.2 oz (74.3 kg) 168 lb 6.9 oz (76.4 kg)    Examination:  General exam: Appears to be in pain, in mild distress Respiratory system: Clear to auscultation. Respiratory effort normal. Cardiovascular system: S1 & S2 heard, RRR. No murmurs, rubs, gallops or clicks. Sternum is tender to palpation. Gastrointestinal system: Well-healing vertical incision down the center of her abdomen, closed with staples, no surrounding erythema. Abdomen soft, mildly diffuse tenderness to palpation, hypoactive bowel sounds, no guarding, no rebound. Central nervous system: Alert and oriented. No focal neurological deficits. Extremities: No edema. No calf tenderness Skin: No cyanosis. No rashes Psychiatry: Judgement and insight appear normal. Mood & affect appropriate.     Data Reviewed: I have personally reviewed following labs and imaging studies  CBC:  Recent Labs Lab 11/17/16 0327 11/17/16 1542 11/18/16 0836 11/20/16 0441 11/21/16 0430  WBC 10.9* 24.2* 21.5* 17.3* 19.2*  NEUTROABS  --   --   --  14.1*  --   HGB 7.5* 8.7* 7.3* 7.2* 7.5*  HCT 22.7* 26.9* 22.3* 22.2* 22.8*  MCV 80.8 81.3 80.8 80.1 79.2  PLT 394 410* 322 315 [id number]   Basic Metabolic Panel:  Recent Labs Lab 11/15/16 0651 11/16/16 0437 11/17/16 0613 11/18/16 0836 11/19/16 0426 11/20/16 0441 11/21/16 0430  NA 136 137 139 136 136 135 133*  K 4.1 4.2 3.9 3.9 3.8 3.7 4.0  CL 105 106 93* 103 104 101 102  CO2 24 25 24 27 27 28 25   GLUCOSE 118* 116* 152* 113* 114* 121* 132*  BUN 17 17 15 20 18 17 19   CREATININE 0.59 0.62 0.63 0.60 0.59 0.49 0.44  CALCIUM 7.9* 8.0* 7.0* 7.9* 7.9* 8.0*  8.0*  MG 2.0 1.7 1.6*  --  1.5* 1.5* 2.0  PHOS 3.6 3.7  --   --   --  3.4 3.6   GFR: Estimated Creatinine Clearance: 55.4 mL/min (by C-G formula based on SCr of 0.44 mg/dL). Liver Function Tests:  Recent Labs Lab 11/16/16 0437 11/20/16 0441  AST 11* 23  ALT 10* 22  ALKPHOS 103 179*  BILITOT 0.5 0.7  PROT 5.7* 6.0*  ALBUMIN 2.0* 1.8*   No results for input(s): LIPASE, AMYLASE in the last 168 hours. No results for input(s): AMMONIA in the last 168 hours. Coagulation Profile: No results for input(s): INR, PROTIME in the last 168 hours. Cardiac Enzymes:  Recent Labs Lab 11/20/16 0833 11/20/16 1743 11/20/16 2047  TROPONINI 0.04* 0.05* 0.05*   BNP (last 3 results) No results for input(s): PROBNP in the last 8760 hours. HbA1C: No results for input(s): HGBA1C in  the last 72 hours. CBG:  Recent Labs Lab 11/19/16 2207 11/20/16 0530 11/20/16 1309 11/20/16 2019 11/21/16 0438  GLUCAP 124* 129* 145* 135* 131*   Lipid Profile:  Recent Labs  11/20/16 0441  TRIG 69   Thyroid Function Tests: No results for input(s): TSH, T4TOTAL, FREET4, T3FREE, THYROIDAB in the last 72 hours. Anemia Panel: No results for input(s): VITAMINB12, FOLATE, FERRITIN, TIBC, IRON, RETICCTPCT in the last 72 hours. Sepsis Labs: No results for input(s): PROCALCITON, LATICACIDVEN in the last 168 hours.  Recent Results (from the past 240 hour(s))  Urine culture     Status: None   Collection Time: 11/20/16  8:51 AM  Result Value Ref Range Status   Specimen Description URINE, CATHETERIZED  Final   Special Requests NONE  Final   Culture   Final    NO GROWTH Performed at [facility], [address], [place], [place] [address]    Report Status 11/21/2016 FINAL  Final  MRSA PCR Screening     Status: None   Collection Time: 11/20/16  3:50 PM  Result Value Ref Range Status   MRSA by PCR NEGATIVE NEGATIVE Final    Comment:        The GeneXpert MRSA Assay (FDA approved for NASAL  specimens only), is one component of a comprehensive MRSA colonization surveillance program. It is not intended to diagnose MRSA infection nor to guide or monitor treatment for MRSA infections.          Radiology Studies: Ct Angio Chest Pe W Or Wo Contrast  Result Date: 11/20/2016 CLINICAL DATA:  Acute onset chest pain yesterday. Patient status post laparoscopic cholecystectomy 10/30/2016. EXAM: CT ANGIOGRAPHY CHEST WITH CONTRAST TECHNIQUE: Multidetector CT imaging of the chest was performed using the standard protocol during bolus administration of intravenous contrast. Multiplanar CT image reconstructions and MIPs were obtained to evaluate the vascular anatomy. CONTRAST:  100 cc Isovue 370. COMPARISON:  PA and lateral chest 10/28/2016. Single-view of the chest 11/20/2016. FINDINGS: Cardiovascular: No pulmonary embolus is identified. Heart size is enlarged. No pericardial effusion. Calcific aortic and coronary atherosclerosis is identified. Mediastinum/Nodes: No enlarged mediastinal, hilar, or axillary lymph nodes. Thyroid gland, trachea, and esophagus demonstrate no significant findings. Lungs/Pleura: No pleural effusion. Lungs demonstrate centrilobular emphysematous disease. Mild dependent atelectasis is seen. No consolidative process, nodule or mass. Upper Abdomen: Negative. Musculoskeletal: No acute or focal bony abnormality. Diffuse subcutaneous edema is seen. Review of the MIP images confirms the above findings. IMPRESSION: Negative for pulmonary embolus or acute disease. Emphysema. Atherosclerosis. Body wall edema compatible with anasarca. Electronically Signed   By: Inge Rise M.D.   On: 11/20/2016 15:50   Dg Chest Port 1v Same Day  Result Date: 11/20/2016 CLINICAL DATA:  Chest pain EXAM: PORTABLE CHEST 1 VIEW COMPARISON:  November 03, 2016 FINDINGS: Nasogastric tube tip and side port are below the diaphragm. Central catheter tip is in the superior vena cava. No pneumothorax.  There is no edema or consolidation. Heart size and pulmonary vascularity are normal. No adenopathy. IMPRESSION: Tube and catheter positions as described. No evident pneumothorax. No edema or consolidation. Electronically Signed   By: Lowella Grip III M.D.   On: 11/20/2016 09:03        Scheduled Meds: . enoxaparin (LOVENOX) injection  40 mg Subcutaneous Q24H  . insulin aspart  0-9 Units Subcutaneous Q8H  . metoprolol  5 mg Intravenous Q6H  . morphine   Intravenous Q4H  . pantoprazole (PROTONIX) IV  80 mg Intravenous Q12H  Continuous Infusions: . Marland KitchenTPN (CLINIMIX-E) Adult     And  . fat emulsion    . lactated ringers 75 mL/hr at 11/21/16 1033  . Marland KitchenTPN (CLINIMIX-E) Adult 75 mL/hr at 11/21/16 1000     LOS: 24 days    [place] Resident, PGY-2 11/21/2016, 11:44 AM   Attending: Cordelia Poche, MD [facility] Pager: [phone]  If 7PM-7AM, please contact night-coverage [email or web address] Password [facility] 11/21/2016, 11:44 AM

## 2016-11-21 NOTE — Progress Notes (Signed)
Nutrition Follow-up  DOCUMENTATION CODES:   Severe malnutrition in context of acute illness/injury  INTERVENTION:  - Continue TPN per Pharmacy. - Will continue to monitor medical course and when diet advancement may be appropriate.   NUTRITION DIAGNOSIS:   Malnutrition related to acute illness as evidenced by energy intake < or equal to 50% for > or equal to 5 days, mild depletion of body fat, severe depletion of muscle mass. -ongoing  GOAL:   Patient will meet greater than or equal to 90% of their needs -met with current TPN regimen.   MONITOR:   Weight trends, Labs, I & O's, Other (Comment) (TPN regimen)  ASSESSMENT:   81 y.o. female with medical history significant of gallstones presents to the hospital with the chief complaint of abdominal pain.  she had been experiencing abdominal pain for 4 weeks prior to this admission, had been diagnosed with gallstones and was scheduled to see general surgery this week. However due to worsening of her symptoms, she presented to the ED on 10/28/16. General surgery was consulted. She was assessed to have symptomatic cholelithiasis and choledocholithiasis. MRCP was negative for obstruction. She underwent laparoscopic cholecystectomy with IOC on 10/30/16 which showed necrotic gallbladder with free intraperitoneal bile, enlarged CBD with free flow into the duodenum.  Pt remains NPO; she has been NPO since admission on 10/28/16. Prolonged ileus with no return of bowel function at this time. Spoke with Dr. Zella Richer this AM and plan at this time is to continue TPN and continue to monitor for progress. At this time there is no plan for return to the OR. Pt not feeling well this AM. NGT in place with 750cc in canister and it appears that 100cc of this is from this shift. Spoke with RN who reports she has not had a chance to document on output/visualize canister this AM but that night RN reported significant output overnight.   Pt continues with Clinimix E  5/15 @ 75 mL/hr with 20% ILE @ 20 mL/hr x12 hours daily. This regimen provides 90 grams of protein, 1758 kcal to meet 100% estimate protein and minimum kcal need.   New weight obtained yesterday and is the first recorded weight since 1/31. From this, weight is +2.1 kg from 1/31. Will continue to monitor weight trends as they become available.  Medications reviewed; sliding scale Novolog, 4 g IV Mg sulfate x1 dose yesterday, PRN IV Zofran, 30 mEq IV KCl x1 run yesterday. Labs reviewed; CBG: 131 mg/dL this AM, Na: 133 mmol/L, Ca: 8 mg/dL.  IVF: LR @ 75 mL/hr.     Diet Order:  Diet NPO time specified Except for: Ice Chips, Sips with Meds TPN (CLINIMIX-E) Adult TPN (CLINIMIX-E) Adult  Skin:  Wound (see comment) (abdominal incision )  Last BM:  2/7  Height:   Ht Readings from Last 1 Encounters:  11/20/16 5' 2"  (1.575 m)    Weight:   Wt Readings from Last 1 Encounters:  11/20/16 168 lb 6.9 oz (76.4 kg)    Ideal Body Weight:  52.3 kg  BMI:  Body mass index is 30.81 kg/m.  Estimated Nutritional Needs:   Kcal:  6010-9323  Protein:  80-90g  Fluid:  1.8L/day  EDUCATION NEEDS:   No education needs identified at this time    Jarome Matin, MS, RD, LDN, CNSC Inpatient Clinical Dietitian Pager # [phone] After hours/weekend pager # [phone]

## 2016-11-21 NOTE — Progress Notes (Signed)
[place] NOTE   Pharmacy Consult for TPN Indication: prolonged ileus  Patient Measurements: Height: 5\' 2"  (157.5 cm) Weight: 168 lb 6.9 oz (76.4 kg) IBW/kg (Calculated) : 50.1 TPN AdjBW (KG): 56.7 Body mass index is 30.81 kg/m. Usual Weight:   Insulin Requirements: 3 units in the last 24hrs.  Current Nutrition: NPO (small aliquots of clear liquids)  IVF: LR at 75 ml/hr  Central access: PICC TPN start date: 11/08/16  ASSESSMENT                                                                                                          HPI: Patient is [name] with hx of gallstones presented to the ED on 1/20 with c/o abdominal pain. MRCP on 1/22 showed distended gallbladder with tiny gallstones and wall thickening with suspicion for acute cholecystitis. Patient underwent lap cholecystectomy with intraoperative cholangiogram on 1/22.  To start TPN for prolonged ileus and poor oral intake.  Significant events:  1/22: lap chole with cholangiogram 1/30: abd KUB with suspicion for post-op ileus; NG tube placed  2/2: high NG output from 1/31-2/1 (1331ml) but decreased 2/1-2/2 (300 ml). RN reported foul smell from NG tube --CCS may d/c NG 2/3: trial clamping NG - successful 2/4 NGT removed. Switching from E 5/20 to E 5/15 to better align with nutritional goals 2/6 leave NGT out, continue NPO, GI consult 2/8 Xray consistent with ileus or SBO - possible ex lap tomorrow 2/9: Underwent exploratory laparotomy, lysis of adhesions.  2/12 Transferred to ICU for chest pain, elevated troponin.  Today 11/21/16:   Glucose controlled (goal <150). No hx of DM.  Electrolytes as of 2/9(no labs 2/10):   Na slightly low  K wnl (goal ~4.0 with ileus) after supplement yesterday.  Mg wnl (goal ~2.0 with ileus) after supplement yesterday  Other lytes stable wnl, CCa WNL  Renal: scr/BUN ok; CrCl 55 ml/min  LFTs: low; albumin low  TGs: wnl  Prealbumin:  5.9 (2/5), 8.3 (2/12)  NUTRITIONAL GOALS                                                                                             RD recs (2/2) Kcal:  1750-1950 Protein:  80-90g Fluid:  1.8L/day  Clinimix E 5/15 (switching from E 5/20 at 70 ml/hr) at a goal rate of 75 ml/hr + 20% fat emulsion at 20 ml/hr x 12 hr to provide: 90 g/day protein, 1758 Kcal/day.  (+++There is currently a Psychologist, prison and probation services of Clinimix solution. Pharmacy will use Clinimix product based on availability+++)  PLAN  At 1800 today:  Continue Clinimix E 5/15 at goal rate of 75 ml/hr.  20% fat emulsion at 20 ml/hr over 12 hrs  Trace elements are on national shortage, will be added as available, omit today since given yesterday.  Multivitamins will be added daily.  continue sensitive SSI q8h.   BMet with mag and phos in am  TPN lab panels on Mondays & Thursdays.  F/u daily.  Melanie [name], PharmD, BCPS Pager: [phone] 11/21/2016, 8:22 AM

## 2016-11-22 DIAGNOSIS — R079 Chest pain, unspecified: Secondary | ICD-10-CM

## 2016-11-22 DIAGNOSIS — D509 Iron deficiency anemia, unspecified: Secondary | ICD-10-CM

## 2016-11-22 LAB — IRON AND TIBC
Iron: 16 ug/dL — ABNORMAL LOW (ref 28–170)
SATURATION RATIOS: 7 % — AB (ref 10.4–31.8)
TIBC: 237 ug/dL — ABNORMAL LOW (ref 250–450)
UIBC: 221 ug/dL

## 2016-11-22 LAB — CBC
HCT: 22.9 % — ABNORMAL LOW (ref 36.0–46.0)
Hemoglobin: 7.4 g/dL — ABNORMAL LOW (ref 12.0–15.0)
MCH: 25.7 pg — ABNORMAL LOW (ref 26.0–34.0)
MCHC: 32.3 g/dL (ref 30.0–36.0)
MCV: 79.5 fL (ref 78.0–100.0)
Platelets: 370 10*3/uL (ref 150–400)
RBC: 2.88 MIL/uL — ABNORMAL LOW (ref 3.87–5.11)
RDW: 17.6 % — ABNORMAL HIGH (ref 11.5–15.5)
WBC: 17.8 10*3/uL — ABNORMAL HIGH (ref 4.0–10.5)

## 2016-11-22 LAB — GLUCOSE, CAPILLARY
GLUCOSE-CAPILLARY: 128 mg/dL — AB (ref 65–99)
Glucose-Capillary: 119 mg/dL — ABNORMAL HIGH (ref 65–99)
Glucose-Capillary: 132 mg/dL — ABNORMAL HIGH (ref 65–99)

## 2016-11-22 LAB — FERRITIN: Ferritin: 99 ng/mL (ref 11–307)

## 2016-11-22 LAB — VITAMIN B12: VITAMIN B 12: 5166 pg/mL — AB (ref 180–914)

## 2016-11-22 LAB — RETICULOCYTES
RBC.: 2.83 MIL/uL — AB (ref 3.87–5.11)
Retic Count, Absolute: 144.3 10*3/uL (ref 19.0–186.0)
Retic Ct Pct: 5.1 % — ABNORMAL HIGH (ref 0.4–3.1)

## 2016-11-22 LAB — FOLATE: FOLATE: 11 ng/mL (ref >5.9)

## 2016-11-22 MED ORDER — FAT EMULSION 20 % IV EMUL
250.0000 mL | INTRAVENOUS | Status: AC
  Administered 2016-11-22: 250 mL via INTRAVENOUS
  Filled 2016-11-22 (×2): qty 250

## 2016-11-22 MED ORDER — MORPHINE SULFATE 2 MG/ML IV SOLN
INTRAVENOUS | Status: DC
  Administered 2016-11-22: 15.57 mg via INTRAVENOUS
  Administered 2016-11-22: 11.64 mg via INTRAVENOUS
  Administered 2016-11-22: 9.29 mg via INTRAVENOUS
  Administered 2016-11-22: 16:00:00 via INTRAVENOUS
  Administered 2016-11-23: 8.9 mg via INTRAVENOUS
  Administered 2016-11-23: 5.37 mg via INTRAVENOUS
  Administered 2016-11-23: 3.26 mg via INTRAVENOUS
  Administered 2016-11-23: 5.36 mg via INTRAVENOUS

## 2016-11-22 MED ORDER — PROMETHAZINE HCL 25 MG/ML IJ SOLN
12.5000 mg | Freq: Once | INTRAMUSCULAR | Status: AC
  Administered 2016-11-22: 12.5 mg via INTRAVENOUS
  Filled 2016-11-22: qty 1

## 2016-11-22 MED ORDER — NITROGLYCERIN 2 % TD OINT
1.0000 [in_us] | TOPICAL_OINTMENT | Freq: Four times a day (QID) | TRANSDERMAL | Status: DC
  Administered 2016-11-22 – 2016-12-04 (×48): 1 [in_us] via TOPICAL
  Filled 2016-11-22 (×2): qty 30

## 2016-11-22 MED ORDER — TRACE MINERALS CR-CU-MN-SE-ZN 10-1000-500-60 MCG/ML IV SOLN
INTRAVENOUS | Status: AC
  Administered 2016-11-22: 18:00:00 via INTRAVENOUS
  Filled 2016-11-22 (×2): qty 1800

## 2016-11-22 NOTE — Progress Notes (Signed)
[place] NOTE   Pharmacy Consult for TPN Indication: prolonged ileus  Patient Measurements: Height: 5\' 2"  (157.5 cm) Weight: 168 lb 6.9 oz (76.4 kg) IBW/kg (Calculated) : 50.1 TPN AdjBW (KG): 56.7 Body mass index is 30.81 kg/m. Usual Weight:   Insulin Requirements: 3 units in the last 24hrs.  Current Nutrition: NPO (small aliquots of clear liquids)  IVF: LR at 75 ml/hr  Central access: PICC TPN start date: 11/08/16  ASSESSMENT                                                                                                          HPI: Patient is [name] with hx of gallstones presented to the ED on 1/20 with c/o abdominal pain. MRCP on 1/22 showed distended gallbladder with tiny gallstones and wall thickening with suspicion for acute cholecystitis. Patient underwent lap cholecystectomy with intraoperative cholangiogram on 1/22.  To start TPN for prolonged ileus and poor oral intake.  Significant events:  1/22: lap chole with cholangiogram 1/30: abd KUB with suspicion for post-op ileus; NG tube placed  2/2: high NG output from 1/31-2/1 (1334ml) but decreased 2/1-2/2 (300 ml). RN reported foul smell from NG tube --CCS may d/c NG 2/3: trial clamping NG - successful 2/4 NGT removed. Switching from E 5/20 to E 5/15 to better align with nutritional goals 2/6 leave NGT out, continue NPO, GI consult 2/8 Xray consistent with ileus or SBO - possible ex lap tomorrow 2/9: Underwent exploratory laparotomy, lysis of adhesions.  2/12 Transferred to ICU for chest pain, elevated troponin.  Today 11/22/16:   Glucose controlled (goal <150). No hx of DM.  Electrolytes as of 2/13 (no labs 2/14):   Na slightly low  K wnl (goal ~4.0 with ileus) after supplement 2/12.  Mg wnl (goal ~2.0 with ileus) after supplement 2/12  Other lytes stable wnl, CCa WNL  Renal: scr/BUN ok; CrCl 55 ml/min  LFTs: low; albumin low  TGs: wnl, 69  (2/12)  Prealbumin: 5.9 (2/5), 8.3 (2/12)  NUTRITIONAL GOALS                                                                                             RD recs (2/2) Kcal:  1750-1950 Protein:  80-90g Fluid:  1.8L/day  Clinimix E 5/15 (switching from E 5/20 at 70 ml/hr) at a goal rate of 75 ml/hr + 20% fat emulsion at 20 ml/hr x 12 hr to provide: 90 g/day protein, 1758 Kcal/day.  (+++There is currently a Psychologist, prison and probation services of Clinimix solution. Pharmacy will use Clinimix product based on availability+++)  PLAN  At 1800 today:  Continue Clinimix E 5/15 at goal rate of 75 ml/hr.  20% fat emulsion at 20 ml/hr over 12 hrs  Trace elements are on national shortage, will be added as available, add today.  Multivitamins will be added daily.  Continue sensitive SSI q8h.   TPN lab panels on Mondays & Thursdays.  F/u daily.  Peggyann Juba, PharmD, BCPS Pager: [phone] 11/22/2016, 8:08 AM

## 2016-11-22 NOTE — Progress Notes (Signed)
PROGRESS NOTE    Melanie [name]  [id number] DOB: 01-08-1936 DOA: 10/28/2016 PCP: Osborne Casco, MD   Brief Narrative:  81 yo female with prolonged hospitalization, admitted on 01/20 for necrotic cholecystitis. Developed post op bowel obstruction, re-intervened on 02/09 with exploratory laparotomy. NG tube in place, on TPN. Consulted for chest pain that ruled out for ACS.    Assessment & Plan:   Principal Problem:   Small bowel obstruction due to adhesions Active Problems:   Necrotic cholecystitis s/p lap cholecystectomy 10/30/2016   Essential hypertension   Hyperlipidemia   COPD (chronic obstructive pulmonary disease) (HCC)   GERD (gastroesophageal reflux disease)   Chest pain  1. Atypical chest pain. Will continue symptomatic control, likely not cardiac in origin. Continue blood pressure monitoring.   2. Necrotic cholecystitis. Patient off antibiotics, will continue to follow cell count and temperature curve.    3. Post operative bowel obstruction. Patient with NG tu low intermittent suction, continue NPO and TPN for nutrition. Continue hydration with balanced electrolyte solutions.   4. HTN. Continue hydralazine as needed for blood pressure control. Systolic blood pressure [id number] to 180.  5. COPD. Will continue oxymetry monitoring and aspiration precautions.   6. Iron deficiency anemia. Hb at 7.4. Following a restrictive blood transfusion strategy. Serum iron 16 with ferritin at 99 and transferrin saturation at 7. Patient may need Iron IV due to NPO status.    DVT prophylaxis: enoxaparin  Code Status: full Family Communication: I spoke with patient's daughter at the bedside and all questions were addressed.   Disposition Plan: Home/ SNF   Consultants:     Procedures:   02/09 Exploratory laparotomy, lysis of adhesions  01/22 Laparoscopic Cholecystectomy with intraoperative cholangiogram.   Antimicrobials:      Subjective: Patient with positive abdominal  pain, moderate in intensity, sharp in nature with no radiation or associated symptoms. Had intermittent mild chest pain overnight.   Objective: Vitals:   11/22/16 0000 11/22/16 0400 11/22/16 0430 11/22/16 0755  BP: (!) 165/43 (!) 195/56 (!) 155/61   Pulse: 85 94    Resp: 15 17    Temp:    98.3 F (36.8 C)  TempSrc:    Oral  SpO2: 100% 100%    Weight:      Height:        Intake/Output Summary (Last 24 hours) at 11/22/16 0821 Last data filed at 11/22/16 0600  Gross per 24 hour  Intake          3967.25 ml  Output             2025 ml  Net          1942.25 ml   Filed Weights   10/28/16 0551 11/08/16 1347 11/20/16 0946  Weight: 70.3 kg (155 lb) 74.3 kg (163 lb 11.2 oz) 76.4 kg (168 lb 6.9 oz)    Examination:  General exam: deconditioned and in pain E ENT. Positive pallor, but no icterus, oral mucosa dry.  Respiratory system: Clear to auscultation. Respiratory effort normal. Decreased breath sounds at bases due to poor inspiratory effort.  Cardiovascular system: S1 & S2 heard, RRR. No JVD, murmurs, rubs, gallops or clicks. No pedal edema. Gastrointestinal system: Abdomen is nondistended, soft. Tender to superficial palpation with no rebound or peritoneal signs. Surgical scar noted to be clean. No organomegaly or masses felt. Normal bowel sounds heard. Central nervous system: Alert and oriented. No focal neurological deficits. Extremities: Symmetric 5 x 5 power. Skin: No rashes, lesions or ulcers  Data Reviewed: I have personally reviewed following labs and imaging studies  CBC:  Recent Labs Lab 11/17/16 1542 11/18/16 0836 11/20/16 0441 11/21/16 0430 11/22/16 0646  WBC 24.2* 21.5* 17.3* 19.2* 17.8*  NEUTROABS  --   --  14.1*  --   --   HGB 8.7* 7.3* 7.2* 7.5* 7.4*  HCT 26.9* 22.3* 22.2* 22.8* 22.9*  MCV 81.3 80.8 80.1 79.2 79.5  PLT 410* 322 315 352 0000000   Basic Metabolic Panel:  Recent Labs Lab 11/16/16 0437 11/17/16 0613 11/18/16 0836 11/19/16 0426  11/20/16 0441 11/21/16 0430  NA 137 139 136 136 135 133*  K 4.2 3.9 3.9 3.8 3.7 4.0  CL 106 93* 103 104 101 102  CO2 25 24 27 27 28 25   GLUCOSE 116* 152* 113* 114* 121* 132*  BUN 17 15 20 18 17 19   CREATININE 0.62 0.63 0.60 0.59 0.49 0.44  CALCIUM 8.0* 7.0* 7.9* 7.9* 8.0* 8.0*  MG 1.7 1.6*  --  1.5* 1.5* 2.0  PHOS 3.7  --   --   --  3.4 3.6   GFR: Estimated Creatinine Clearance: 55.4 mL/min (by C-G formula based on SCr of 0.44 mg/dL). Liver Function Tests:  Recent Labs Lab 11/16/16 0437 11/20/16 0441  AST 11* 23  ALT 10* 22  ALKPHOS 103 179*  BILITOT 0.5 0.7  PROT 5.7* 6.0*  ALBUMIN 2.0* 1.8*   No results for input(s): LIPASE, AMYLASE in the last 168 hours. No results for input(s): AMMONIA in the last 168 hours. Coagulation Profile: No results for input(s): INR, PROTIME in the last 168 hours. Cardiac Enzymes:  Recent Labs Lab 11/20/16 0833 11/20/16 1743 11/20/16 2047 11/21/16 1406  TROPONINI 0.04* 0.05* 0.05* 0.05*   BNP (last 3 results) No results for input(s): PROBNP in the last 8760 hours. HbA1C: No results for input(s): HGBA1C in the last 72 hours. CBG:  Recent Labs Lab 11/20/16 2019 11/21/16 0438 11/21/16 1412 11/21/16 2130 11/22/16 0535  GLUCAP 135* 131* 137* 137* 119*   Lipid Profile:  Recent Labs  11/20/16 0441  TRIG 69   Thyroid Function Tests: No results for input(s): TSH, T4TOTAL, FREET4, T3FREE, THYROIDAB in the last 72 hours. Anemia Panel:  Recent Labs  11/22/16 0646  RETICCTPCT 5.1*   Sepsis Labs: No results for input(s): PROCALCITON, LATICACIDVEN in the last 168 hours.  Recent Results (from the past 240 hour(s))  Urine culture     Status: None   Collection Time: 11/20/16  8:51 AM  Result Value Ref Range Status   Specimen Description URINE, CATHETERIZED  Final   Special Requests NONE  Final   Culture   Final    NO GROWTH Performed at [facility], [address] [place], [place] [address]    Report Status  11/21/2016 FINAL  Final  MRSA PCR Screening     Status: None   Collection Time: 11/20/16  3:50 PM  Result Value Ref Range Status   MRSA by PCR NEGATIVE NEGATIVE Final    Comment:        The GeneXpert MRSA Assay (FDA approved for NASAL specimens only), is one component of a comprehensive MRSA colonization surveillance program. It is not intended to diagnose MRSA infection nor to guide or monitor treatment for MRSA infections.          Radiology Studies: Ct Angio Chest Pe W Or Wo Contrast  Result Date: 11/20/2016 CLINICAL DATA:  Acute onset chest pain yesterday. Patient status post laparoscopic cholecystectomy 10/30/2016. EXAM:  CT ANGIOGRAPHY CHEST WITH CONTRAST TECHNIQUE: Multidetector CT imaging of the chest was performed using the standard protocol during bolus administration of intravenous contrast. Multiplanar CT image reconstructions and MIPs were obtained to evaluate the vascular anatomy. CONTRAST:  100 cc Isovue 370. COMPARISON:  PA and lateral chest 10/28/2016. Single-view of the chest 11/20/2016. FINDINGS: Cardiovascular: No pulmonary embolus is identified. Heart size is enlarged. No pericardial effusion. Calcific aortic and coronary atherosclerosis is identified. Mediastinum/Nodes: No enlarged mediastinal, hilar, or axillary lymph nodes. Thyroid gland, trachea, and esophagus demonstrate no significant findings. Lungs/Pleura: No pleural effusion. Lungs demonstrate centrilobular emphysematous disease. Mild dependent atelectasis is seen. No consolidative process, nodule or mass. Upper Abdomen: Negative. Musculoskeletal: No acute or focal bony abnormality. Diffuse subcutaneous edema is seen. Review of the MIP images confirms the above findings. IMPRESSION: Negative for pulmonary embolus or acute disease. Emphysema. Atherosclerosis. Body wall edema compatible with anasarca. Electronically Signed   By: Inge Rise M.D.   On: 11/20/2016 15:50   Dg Chest Port 1v Same Day  Result  Date: 11/20/2016 CLINICAL DATA:  Chest pain EXAM: PORTABLE CHEST 1 VIEW COMPARISON:  November 03, 2016 FINDINGS: Nasogastric tube tip and side port are below the diaphragm. Central catheter tip is in the superior vena cava. No pneumothorax. There is no edema or consolidation. Heart size and pulmonary vascularity are normal. No adenopathy. IMPRESSION: Tube and catheter positions as described. No evident pneumothorax. No edema or consolidation. Electronically Signed   By: Lowella Grip III M.D.   On: 11/20/2016 09:03        Scheduled Meds: . enoxaparin (LOVENOX) injection  40 mg Subcutaneous Q24H  . insulin aspart  0-9 Units Subcutaneous Q8H  . metoprolol  5 mg Intravenous Q6H  . morphine   Intravenous Q4H  . nitroGLYCERIN  1 inch Topical Q6H  . pantoprazole (PROTONIX) IV  80 mg Intravenous Q12H   Continuous Infusions: . Marland KitchenTPN (CLINIMIX-E) Adult     And  . fat emulsion    . lactated ringers 75 mL/hr at 11/22/16 0050  . Marland KitchenTPN (CLINIMIX-E) Adult 75 mL/hr at 11/21/16 1800     LOS: 25 days     Viriginia Amendola Gerome Apley, MD [facility] Pager [phone]  If 7PM-7AM, please contact night-coverage [email or web address] Password [email or web address] 11/22/2016, 8:21 AM

## 2016-11-22 NOTE — Progress Notes (Signed)
Report given to Shanon Brow receiving Rn, pt going to [id number].

## 2016-11-22 NOTE — Progress Notes (Signed)
Assessment S/p Laparoscopic Cholecystectomy with intraoperative cholangiogram, 10/30/16, Dr. Johnathan Hausen; Necrotic gallbladder with free intrapertioneal bile; enlarged CBD with free flow into the duodenum    Postop SBO s/p Exploratory laparotomy with LOA - findings:  Mechanical obstruction, 11/17/16,( Dr. Burton Apley postop ileus now; ng output increased past 24 hrs; WBC down some today Anemia  PC Malnutrition - prealbumin 8.3; on TPN Hypertension COPD   Chest pain-slight elevation of troponin.  ECHO->estimated EF = 60-65% with no regional wall abnormalities,. Chest CT negative for infiltrate or PE. May be related to esophageal spasm   Plan:  Transfer to floor.  Continue TPN.  Add basal rate to PCA.  Nitropaste for esophageal spasm.  Continue ng until ileus improves.   LOS: 25 days     5 Days Post-Op  Subjective: Still having abdominal pain but is a little better.  Passing some gas. RN reports that she does not use the PCA well.  Objective: Vital signs in last 24 hours: Temp:  [97.8 F (36.6 C)-98.5 F (36.9 C)] 98.4 F (36.9 C) (02/13 2338) Pulse Rate:  [85-106] 94 (02/14 0400) Resp:  [13-22] 17 (02/14 0400) BP: (155-195)/(39-87) 155/61 (02/14 0430) SpO2:  [96 %-100 %] 100 % (02/14 0400) Last BM Date: 11/15/16  Intake/Output from previous day: 02/13 0701 - 02/14 0700 In: 4267.3 [I.V.:3817.3; NG/GT:30; IV Piggyback:420] Out: 2025 [Urine:675; Emesis/NG output:1350] Intake/Output this shift: No intake/output data recorded.  PE: General- In NAD.  NG in with bilious output Abdomen-soft, incision is clean and intact, more active bowel sounds today  Lab Results:   Recent Labs  11/21/16 0430 11/22/16 0646  WBC 19.2* 17.8*  HGB 7.5* 7.4*  HCT 22.8* 22.9*  PLT 352 370   BMET  Recent Labs  11/20/16 0441 11/21/16 0430  NA 135 133*  K 3.7 4.0  CL 101 102  CO2 28 25  GLUCOSE 121* 132*  BUN 17 19  CREATININE 0.49 0.44  CALCIUM 8.0* 8.0*   PT/INR No results  for input(s): LABPROT, INR in the last 72 hours. Comprehensive Metabolic Panel:    Component Value Date/Time   NA 133 (L) 11/21/2016 0430   NA 135 11/20/2016 0441   K 4.0 11/21/2016 0430   K 3.7 11/20/2016 0441   CL 102 11/21/2016 0430   CL 101 11/20/2016 0441   CO2 25 11/21/2016 0430   CO2 28 11/20/2016 0441   BUN 19 11/21/2016 0430   BUN 17 11/20/2016 0441   CREATININE 0.44 11/21/2016 0430   CREATININE 0.49 11/20/2016 0441   GLUCOSE 132 (H) 11/21/2016 0430   GLUCOSE 121 (H) 11/20/2016 0441   CALCIUM 8.0 (L) 11/21/2016 0430   CALCIUM 8.0 (L) 11/20/2016 0441   AST 23 11/20/2016 0441   AST 11 (L) 11/16/2016 0437   ALT 22 11/20/2016 0441   ALT 10 (L) 11/16/2016 0437   ALKPHOS 179 (H) 11/20/2016 0441   ALKPHOS 103 11/16/2016 0437   BILITOT 0.7 11/20/2016 0441   BILITOT 0.5 11/16/2016 0437   PROT 6.0 (L) 11/20/2016 0441   PROT 5.7 (L) 11/16/2016 0437   ALBUMIN 1.8 (L) 11/20/2016 0441   ALBUMIN 2.0 (L) 11/16/2016 0437     Studies/Results: Ct Angio Chest Pe W Or Wo Contrast  Result Date: 11/20/2016 CLINICAL DATA:  Acute onset chest pain yesterday. Patient status post laparoscopic cholecystectomy 10/30/2016. EXAM: CT ANGIOGRAPHY CHEST WITH CONTRAST TECHNIQUE: Multidetector CT imaging of the chest was performed using the standard protocol during bolus administration of intravenous contrast. Multiplanar CT image reconstructions  and MIPs were obtained to evaluate the vascular anatomy. CONTRAST:  100 cc Isovue 370. COMPARISON:  PA and lateral chest 10/28/2016. Single-view of the chest 11/20/2016. FINDINGS: Cardiovascular: No pulmonary embolus is identified. Heart size is enlarged. No pericardial effusion. Calcific aortic and coronary atherosclerosis is identified. Mediastinum/Nodes: No enlarged mediastinal, hilar, or axillary lymph nodes. Thyroid gland, trachea, and esophagus demonstrate no significant findings. Lungs/Pleura: No pleural effusion. Lungs demonstrate centrilobular  emphysematous disease. Mild dependent atelectasis is seen. No consolidative process, nodule or mass. Upper Abdomen: Negative. Musculoskeletal: No acute or focal bony abnormality. Diffuse subcutaneous edema is seen. Review of the MIP images confirms the above findings. IMPRESSION: Negative for pulmonary embolus or acute disease. Emphysema. Atherosclerosis. Body wall edema compatible with anasarca. Electronically Signed   By: Inge Rise M.D.   On: 11/20/2016 15:50   Dg Chest Port 1v Same Day  Result Date: 11/20/2016 CLINICAL DATA:  Chest pain EXAM: PORTABLE CHEST 1 VIEW COMPARISON:  November 03, 2016 FINDINGS: Nasogastric tube tip and side port are below the diaphragm. Central catheter tip is in the superior vena cava. No pneumothorax. There is no edema or consolidation. Heart size and pulmonary vascularity are normal. No adenopathy. IMPRESSION: Tube and catheter positions as described. No evident pneumothorax. No edema or consolidation. Electronically Signed   By: Lowella Grip III M.D.   On: 11/20/2016 09:03    Anti-infectives: Anti-infectives    Start     Dose/Rate Route Frequency Ordered Stop   11/17/16 0730  cefoTEtan (CEFOTAN) 2 g in dextrose 5 % 50 mL IVPB     2 g 100 mL/hr over 30 Minutes Intravenous On call to O.R. 11/17/16 0717 11/17/16 1040   11/04/16 0900  piperacillin-tazobactam (ZOSYN) IVPB 3.375 g  Status:  Discontinued     3.375 g 12.5 mL/hr over 240 Minutes Intravenous Every 8 hours 11/04/16 0833 11/17/16 0631   10/30/16 1600  piperacillin-tazobactam (ZOSYN) IVPB 3.375 g  Status:  Discontinued     3.375 g 12.5 mL/hr over 240 Minutes Intravenous Every 8 hours 10/30/16 1539 11/01/16 1814   10/29/16 1200  piperacillin-tazobactam (ZOSYN) IVPB 3.375 g  Status:  Discontinued     3.375 g 12.5 mL/hr over 240 Minutes Intravenous Every 8 hours 10/29/16 0941 10/30/16 1539       Mateja Dier J 11/22/2016

## 2016-11-23 ENCOUNTER — Inpatient Hospital Stay (HOSPITAL_COMMUNITY): Payer: Medicare Other

## 2016-11-23 LAB — CBC
HEMATOCRIT: 22.9 % — AB (ref 36.0–46.0)
HEMOGLOBIN: 7.6 g/dL — AB (ref 12.0–15.0)
MCH: 26.4 pg (ref 26.0–34.0)
MCHC: 33.2 g/dL (ref 30.0–36.0)
MCV: 79.5 fL (ref 78.0–100.0)
Platelets: 405 10*3/uL — ABNORMAL HIGH (ref 150–400)
RBC: 2.88 MIL/uL — AB (ref 3.87–5.11)
RDW: 17.4 % — ABNORMAL HIGH (ref 11.5–15.5)
WBC: 20.1 10*3/uL — ABNORMAL HIGH (ref 4.0–10.5)

## 2016-11-23 LAB — COMPREHENSIVE METABOLIC PANEL
ALBUMIN: 2.1 g/dL — AB (ref 3.5–5.0)
ALK PHOS: 253 U/L — AB (ref 38–126)
ALT: 42 U/L (ref 14–54)
AST: 49 U/L — AB (ref 15–41)
Anion gap: 7 (ref 5–15)
BILIRUBIN TOTAL: 1.2 mg/dL (ref 0.3–1.2)
BUN: 23 mg/dL — AB (ref 6–20)
CALCIUM: 8.4 mg/dL — AB (ref 8.9–10.3)
CO2: 25 mmol/L (ref 22–32)
CREATININE: 0.53 mg/dL (ref 0.44–1.00)
Chloride: 104 mmol/L (ref 101–111)
GFR calc Af Amer: >60 mL/min (ref >60)
GFR calc non Af Amer: >60 mL/min (ref >60)
GLUCOSE: 133 mg/dL — AB (ref 65–99)
Potassium: 4.1 mmol/L (ref 3.5–5.1)
SODIUM: 136 mmol/L (ref 135–145)
Total Protein: 6.4 g/dL — ABNORMAL LOW (ref 6.5–8.1)

## 2016-11-23 LAB — GLUCOSE, CAPILLARY
GLUCOSE-CAPILLARY: 127 mg/dL — AB (ref 65–99)
GLUCOSE-CAPILLARY: 127 mg/dL — AB (ref 65–99)
Glucose-Capillary: 134 mg/dL — ABNORMAL HIGH (ref 65–99)

## 2016-11-23 LAB — PHOSPHORUS: Phosphorus: 4 mg/dL (ref 2.5–4.6)

## 2016-11-23 LAB — MAGNESIUM: Magnesium: 1.7 mg/dL (ref 1.7–2.4)

## 2016-11-23 MED ORDER — SODIUM CHLORIDE 0.9% FLUSH: 9.0000 mL | INTRAVENOUS | Status: DC | PRN

## 2016-11-23 MED ORDER — PIPERACILLIN-TAZOBACTAM 3.375 G IVPB
3.3750 g | Freq: Three times a day (TID) | INTRAVENOUS | Status: DC
  Administered 2016-11-23 – 2016-11-26 (×8): 3.375 g via INTRAVENOUS
  Filled 2016-11-23 (×7): qty 50

## 2016-11-23 MED ORDER — IOPAMIDOL (ISOVUE-300) INJECTION 61%
INTRAVENOUS | Status: AC
  Filled 2016-11-23: qty 30

## 2016-11-23 MED ORDER — DIPHENHYDRAMINE HCL 12.5 MG/5ML PO ELIX: 12.5000 mg | ORAL_SOLUTION | Freq: Four times a day (QID) | ORAL | Status: DC | PRN

## 2016-11-23 MED ORDER — NALOXONE HCL 0.4 MG/ML IJ SOLN: 0.4000 mg | INTRAMUSCULAR | Status: DC | PRN

## 2016-11-23 MED ORDER — IOPAMIDOL (ISOVUE-300) INJECTION 61%
100.0000 mL | Freq: Once | INTRAVENOUS | Status: AC | PRN
  Administered 2016-11-23: 100 mL via INTRAVENOUS

## 2016-11-23 MED ORDER — FAT EMULSION 20 % IV EMUL
250.0000 mL | INTRAVENOUS | Status: DC
  Administered 2016-11-23: 250 mL via INTRAVENOUS
  Filled 2016-11-23: qty 250

## 2016-11-23 MED ORDER — MORPHINE SULFATE (PF) 4 MG/ML IV SOLN
1.0000 mg | INTRAVENOUS | Status: DC | PRN
  Administered 2016-11-23 – 2016-11-24 (×3): 1 mg via INTRAVENOUS
  Filled 2016-11-23 (×3): qty 1

## 2016-11-23 MED ORDER — SODIUM CHLORIDE 0.9 % IJ SOLN
INTRAMUSCULAR | Status: AC
  Filled 2016-11-23: qty 50

## 2016-11-23 MED ORDER — DIPHENHYDRAMINE HCL 50 MG/ML IJ SOLN
12.5000 mg | Freq: Four times a day (QID) | INTRAMUSCULAR | Status: DC | PRN
  Administered 2016-11-23: 12.5 mg via INTRAVENOUS
  Filled 2016-11-23: qty 1

## 2016-11-23 MED ORDER — ONDANSETRON HCL 4 MG/2ML IJ SOLN: 4.0000 mg | Freq: Four times a day (QID) | INTRAMUSCULAR | Status: DC | PRN

## 2016-11-23 MED ORDER — IOPAMIDOL (ISOVUE-300) INJECTION 61%
INTRAVENOUS | Status: AC
  Filled 2016-11-23: qty 100

## 2016-11-23 MED ORDER — PROMETHAZINE HCL 25 MG/ML IJ SOLN
12.5000 mg | Freq: Once | INTRAMUSCULAR | Status: AC
  Administered 2016-11-23: 12.5 mg via INTRAVENOUS
  Filled 2016-11-23: qty 1

## 2016-11-23 MED ORDER — FENTANYL 40 MCG/ML IV SOLN
INTRAVENOUS | Status: DC
  Administered 2016-11-23: 14:00:00 via INTRAVENOUS
  Administered 2016-11-23: 180 ug via INTRAVENOUS
  Administered 2016-11-24: 165 ug via INTRAVENOUS
  Filled 2016-11-23: qty 25

## 2016-11-23 MED ORDER — DIPHENHYDRAMINE HCL 50 MG/ML IJ SOLN: 12.5000 mg | Freq: Four times a day (QID) | INTRAMUSCULAR | Status: DC | PRN

## 2016-11-23 MED ORDER — M.V.I. ADULT IV INJ
INTRAVENOUS | Status: AC
  Administered 2016-11-23: 17:00:00 via INTRAVENOUS
  Filled 2016-11-23 (×2): qty 1800

## 2016-11-23 MED ORDER — FENTANYL 40 MCG/ML IV SOLN: INTRAVENOUS | Status: DC

## 2016-11-23 NOTE — Progress Notes (Signed)
3 ml morphine pca wasted with Kiristin Ryan.  Patient given 2 loading doses from fentanyl pca.  Patient went from screaming in pain, to smiling, and stating that she was not having any pain.

## 2016-11-23 NOTE — Progress Notes (Signed)
Nutrition Follow-up  DOCUMENTATION CODES:   Severe malnutrition in context of acute illness/injury  INTERVENTION:   TPN per Pharmacy  Currently:    Clinimix E 5/15 at goal rate of 75 ml/hr.  20% fat emulsion at 20 ml/hr over 12 hrs  Trace elements are on national shortage, will be added as available, add today.  Multivitamins will be added daily.  Continue sensitive SSI q8h.   TPN lab panels on Mondays & Thursdays.  RD will continue to monitor for diet advancement   NUTRITION DIAGNOSIS:   Malnutrition related to acute illness as evidenced by energy intake < or equal to 50% for > or equal to 5 days, mild depletion of body fat, severe depletion of muscle mass.  GOAL:   Patient will meet greater than or equal to 90% of their needs  MONITOR:   Weight trends, Labs, I & O's, Other (Comment) (TPN regimen)  ASSESSMENT:   81 y.o F with hx of gallstones presented to the ED on 1/20 with c/o abdominal pain. MRCP on 1/22 showed distended gallbladder with tiny gallstones and wall thickening with suspicion for acute cholecystitis. Patient underwent lap cholecystectomy with intraoperative cholangiogram on 1/22.  To start TPN for prolonged ileus and poor oral intake.   Significant events:  1/22: lap chole with cholangiogram 1/30: abd KUB with suspicion for post-op ileus; NG tube placed  2/2: high NG output from 1/31-2/1 (1311m) but decreased 2/1-2/2 (300 ml). RN reported foul smell from NG tube --CCS may d/c NG 2/3: trial clamping NG - successful 2/4 NGT removed. Switching from E 5/20 to E 5/15 to better align with nutritional goals 2/6 leave NGT out, continue NPO, GI consult 2/8 Xray consistent with ileus or SBO - possible ex lap tomorrow 2/9: Underwent exploratorylaparotomy, lysis of adhesions.  2/12 Transferred to ICU for chest pain, elevated troponin.  Pt continues to tolerate TPN well. Labs normal today. NGT on intermittent low suction. RD will continue to monitor for diet  advancement.   Medications reviewed and include: lovenox, insulin, morphine, protonix  Labs reviewed: BUN 23(H), Ca 8.4(L) adj. 9.92 wnl, Alk Phos 253(H), Alb 2.1(L), AST 49(H) Wbc- 20.1(H), Hgb 7.6(L), Hct 22.9(L)  Diet Order:  Diet NPO time specified Except for: Ice Chips, Sips with Meds TPN (CLINIMIX-E) Adult  Skin:  Wound (see comment) (abdominal incision )  Last BM:  2/7  Height:   Ht Readings from Last 1 Encounters:  11/20/16 5' 2"  (1.575 m)    Weight:   Wt Readings from Last 1 Encounters:  11/20/16 168 lb 6.9 oz (76.4 kg)    Ideal Body Weight:  52.3 kg  BMI:  Body mass index is 30.81 kg/m.  Estimated Nutritional Needs:   Kcal:  [address] Protein:  80-90g  Fluid:  1.8L/day  EDUCATION NEEDS:   No education needs identified at this time  CKoleen Distance RD, LDN Pager #[phone]

## 2016-11-23 NOTE — Progress Notes (Signed)
6 Days Post-Op  Subjective: Lying in bed,  groaning with pain. No flatus. Her daughter says they knocked her out last night so she slept last PM. She has orders for IV pain every hour for severe pain. Along with the PCA. The additional pain medicine was given because she would forget the push the button. If I'm counting this correctly she gets 67 mg of morphine yesterday, and she is received 24 mg so far today.   Objective: Vital signs in last 24 hours: Temp:  [97.9 F (36.6 C)-99.6 F (37.6 C)] 99.6 F (37.6 C) (02/15 0533) Pulse Rate:  [96-114] 114 (02/15 0533) Resp:  [7-24] 20 (02/15 1050) BP: (151-175)/(57-72) 175/72 (02/15 0533) SpO2:  [96 %-100 %] 100 % (02/15 1050) Last BM Date: 11/15/16 3700 IV 900 urine NG 500 TM 99.6 CMP OK  WBC is up [place] is stable but down   Intake/Output from previous day: 02/14 0701 - 02/15 0700 In: 3749.2 [I.V.:3549.2; IV Piggyback:200] Out: 1400 [Urine:900; Emesis/NG output:500] Intake/Output this shift: Total I/O In: -  Out: 200 [Urine:200]  General appearance: Patient continues to be severely ill. She was asleep when I first came in but once she is awake she lies there with her eyes closed and groans with pain. Pain is diffuse abdominal discomfort, no flatus, no BM. Resp: Decreased breath sounds both bases. GI: Her abdomen is soft, but she is groaning chronically from pain. Incision looks like it's healing nicely. No bowel sounds, some distention.  Lab Results:   Recent Labs  11/22/16 0646 11/23/16 0026  WBC 17.8* 20.1*  HGB 7.4* 7.6*  HCT 22.9* 22.9*  PLT 370 405*    BMET  Recent Labs  11/21/16 0430 11/23/16 0500  NA 133* 136  K 4.0 4.1  CL 102 104  CO2 25 25  GLUCOSE 132* 133*  BUN 19 23*  CREATININE 0.44 0.53  CALCIUM 8.0* 8.4*   PT/INR No results for input(s): LABPROT, INR in the last 72 hours.   Recent Labs Lab 11/20/16 0441 11/23/16 0500  AST 23 49*  ALT 22 42  ALKPHOS 179* 253*  BILITOT 0.7 1.2  PROT  6.0* 6.4*  ALBUMIN 1.8* 2.1*     Lipase     Component Value Date/Time   LIPASE 13 11/11/2016 0945     Studies/Results: No results found.  Medications: . enoxaparin (LOVENOX) injection  40 mg Subcutaneous Q24H  . insulin aspart  0-9 Units Subcutaneous Q8H  . metoprolol  5 mg Intravenous Q6H  . morphine   Intravenous Q4H  . nitroGLYCERIN  1 inch Topical Q6H  . pantoprazole (PROTONIX) IV  80 mg Intravenous Q12H   . Marland KitchenTPN (CLINIMIX-E) Adult     And  . fat emulsion    . lactated ringers 75 mL/hr at 11/23/16 0436  . Marland KitchenTPN (CLINIMIX-E) Adult 75 mL/hr at 11/22/16 1746    Assessment/Plan Admitted 1/20 - Medicine S/p Laparoscopic Cholecystectomy with intraoperative cholangiogram, 10/30/16, Dr. Johnathan Hausen; Necrotic gallbladder with free intrapertioneal bile; enlarged CBD with free flow into the duodenum  POD 21 CT 1/25 - ? Ileus, no abscess/ HIDA 11/04/16 - no leak/ CT 11/10/16: fluid in GB fossa, no abscess - ileus/  NG placed 1/30 Exploratory laparotomy with LOA - findings:  Mechanical obstruction, 11/17/16, Dr. Dalbert Batman  POD 6 Chest pain with elevated troponin - Echo EF 0000000 1 diastolic dysfunction/Trivial MR Anemia  Malnutrition - prealbumin 8.3  Hypertension COPD GERD-tonics History infected knee after TKA. History MRSA FEN:  IV fluids/NPO  ID: Zosyn 1/21- 11/16/16 Cefotetan 1 preop dose 11/17/16 DVT:  Lovenox   Plan: I'm going to stop the morphine and switch her over to a fentanyl PCA. She's made no progress since Monday. Her pain location is now on her abdomen nontender chest. No or minimal flatus. No bowel movement. WBC is still elevated and going up. I'm going to get a CT scan with contrast of her abdomen and pelvis. Will repeat labs again in the a.m.       LOS: 26 days    Melanie [name] 11/23/2016 [phone]

## 2016-11-23 NOTE — Progress Notes (Signed)
PT Cancellation Note  Patient Details Name: Melanie [name] MRN: [id number] DOB: 02/04/1936   Cancelled Treatment:    Reason Eval/Treat Not Completed: Pain limiting ability to participate (pt moaning in pain at rest, unable to tolerate very gentle LE ROM exercises. She seems somewhat confused, she yelled that she needed a hat, and that "it has to come out!" RN notified. )   Philomena Doheny 11/23/2016, 1:49 PM [phone]

## 2016-11-23 NOTE — Care Management Important Message (Addendum)
Important Message  Patient Details IM Letter given to Sarah/Case Manager to present to Patient Name: Melanie [name] MRN: [id number] Date of Birth: 1936/02/19   Medicare Important Message Given:  Yes    Kerin Salen 11/23/2016, 12:23 [place] Message  Patient Details  Name: Melanie [name] MRN: [id number] Date of Birth: 10-Aug-1936   Medicare Important Message Given:  Yes    Kerin Salen 11/23/2016, 12:23 PM

## 2016-11-23 NOTE — Progress Notes (Signed)
[place] NOTE   Pharmacy Consult for TPN Indication: prolonged ileus  Patient Measurements: Height: 5\' 2"  (157.5 cm) Weight: 168 lb 6.9 oz (76.4 kg) IBW/kg (Calculated) : 50.1 TPN AdjBW (KG): 56.7 Body mass index is 30.81 kg/m.  Insulin Requirements: 3 units in the last 24hrs.  Current Nutrition: NPO (small aliquots of clear liquids)  IVF: LR at 75 ml/hr  Central access: PICC TPN start date: 11/08/16  ASSESSMENT                                                                                                          HPI: Patient is [name] with hx of gallstones presented to the ED on 1/20 with c/o abdominal pain. MRCP on 1/22 showed distended gallbladder with tiny gallstones and wall thickening with suspicion for acute cholecystitis. Patient underwent lap cholecystectomy with intraoperative cholangiogram on 1/22.  To start TPN for prolonged ileus and poor oral intake.  Significant events:  1/22: lap chole with cholangiogram 1/30: abd KUB with suspicion for post-op ileus; NG tube placed  2/2: high NG output from 1/31-2/1 (135ml) but decreased 2/1-2/2 (300 ml). RN reported foul smell from NG tube --CCS may d/c NG 2/3: trial clamping NG - successful 2/4 NGT removed. Switching from E 5/20 to E 5/15 to better align with nutritional goals 2/6 leave NGT out, continue NPO, GI consult 2/8 Xray consistent with ileus or SBO - possible ex lap tomorrow 2/9: Underwent exploratory laparotomy, lysis of adhesions.  2/12 Transferred to ICU for chest pain, elevated troponin.  Today 11/23/16:   Glucose controlled (goal <150). No hx of DM.  Electrolytes WNL, Ca low but CCa WNL  Renal: scr/BUN ok; CrCl 55 ml/min  LFTs: low; albumin low  TGs: wnl, 69 (2/12)  Prealbumin: 5.9 (2/5), 8.3 (2/12)  NUTRITIONAL GOALS                                                                                             RD recs (2/2) Kcal:  1750-1950 Protein:   80-90g Fluid:  1.8L/day  Clinimix E 5/15 (switching from E 5/20 at 70 ml/hr) at a goal rate of 75 ml/hr + 20% fat emulsion at 20 ml/hr x 12 hr to provide:  90 g/day protein, 1758 Kcal/day.  (+++There is currently a Psychologist, prison and probation services of Clinimix solution. Pharmacy will use Clinimix product based on availability+++)  PLAN  At 1800 today:  Continue Clinimix E 5/15 at goal rate of 75 ml/hr.  20% fat emulsion at 20 ml/hr over 12 hrs  Trace elements are on national shortage, will be added as available.  Hold today, received in 2/14 bag.  Multivitamins will be added daily.  Continue sensitive SSI q8h.   TPN lab panels on Mondays & Thursdays.  F/u daily.  Netta Cedars, PharmD, BCPS Pager: [phone] 11/23/2016, 10:01 AM

## 2016-11-23 NOTE — Progress Notes (Signed)
PROGRESS NOTE    Melanie [name]  [id number] DOB: 1936/08/01 DOA: 10/28/2016 PCP: Osborne Casco, MD   Brief Narrative:  81 yo female with prolonged hospitalization, admitted on 01/20 for necrotic cholecystitis. Developed post op bowel obstruction, re-intervened on 02/09 with exploratory laparotomy. NG tube in place, on TPN. Consulted for chest pain that ruled out for ACS. Patient has been in persistent pain, on PCA pump.    Assessment & Plan:   Principal Problem:   Small bowel obstruction s/p ex lap & lysis of adhesions 11/17/2016 Active Problems:   Necrotic cholecystitis s/p lap cholecystectomy 10/30/2016   Essential hypertension   Hyperlipidemia   COPD (chronic obstructive pulmonary disease) (HCC)   GERD (gastroesophageal reflux disease)   Chest pain    1. Atypical chest pain. Continue symptomatic pain control, likely not cardiac in origin.   2. Necrotic cholecystitis. Patient off antibiotics, worsening leukocytosis to 20 and positive fever, recommend to have a low threshold for re-imaging and resuming antibiotic therapy.   3. Post operative bowel obstruction. NG tu low intermittent suction, emesis output 500. Continue NPO and TPN for nutrition. Continue hydration with balanced electrolyte solutions. Fluid balance positive 2, 349 ml over last 24 hours.   4. HTN. Continue hydralazine as needed for blood pressure control. Systolic blood pressure [id number] with positive tachycardia and tachypnea.   5. COPD. Will continue oxymetry monitoring and aspiration precautions. Supplemental 02 per .   6. Iron deficiency anemia. Hb at 7.6. Following a restrictive blood transfusion strategy. Patient may benefit from IV iron if not enteral nutrition feasible.   DVT prophylaxis: enoxaparin  Code Status: full Family Communication: No family at the bedside.   Disposition Plan: Home/ SNF   Consultants:     Procedures:   02/09 Exploratory laparotomy, lysis of  adhesions  01/22 Laparoscopic Cholecystectomy with intraoperative cholangiogram.    Subjective: Patient in pain, not answering to questions. All information from nursing. Pain has been persistent and PCA pump has been changed to fentanyl.   Objective: Vitals:   11/23/16 1041 11/23/16 1050 11/23/16 1334 11/23/16 1412  BP:   (!) 178/78   Pulse:   (!) 111   Resp:  20 20 20   Temp:   (!) 100.7 F (38.2 C)   TempSrc:   Axillary   SpO2: 100% 100% 98% 98%  Weight:      Height:        Intake/Output Summary (Last 24 hours) at 11/23/16 1531 Last data filed at 11/23/16 1412  Gross per 24 hour  Intake          4739.17 ml  Output              900 ml  Net          3839.17 ml   Filed Weights   10/28/16 0551 11/08/16 1347 11/20/16 0946  Weight: 70.3 kg (155 lb) 74.3 kg (163 lb 11.2 oz) 76.4 kg (168 lb 6.9 oz)    Examination:  General exam: deconditioned, noted to be in pain E ENT. Mild pallor, no icterus, oral mucosa moist Respiratory system: Clear to auscultation. Decreased breath sounds at bases due to poor inspiratory effort.  Cardiovascular system: S1 & S2 heard, RRR. No JVD, murmurs, rubs, gallops or clicks. No pedal edema. Gastrointestinal system: Abdomen is nondistended, but tender to superficial palpation, no rebound or fuarding. No organomegaly or masses felt. Normal bowel sounds heard. Central nervous system:  No focal neurological deficits. Extremities: Symmetric 5 x 5 power. Skin: No  rashes, lesions or ulcers.     Data Reviewed: I have personally reviewed following labs and imaging studies  CBC:  Recent Labs Lab 11/18/16 0836 11/20/16 0441 11/21/16 0430 11/22/16 0646 11/23/16 0026  WBC 21.5* 17.3* 19.2* 17.8* 20.1*  NEUTROABS  --  14.1*  --   --   --   HGB 7.3* 7.2* 7.5* 7.4* 7.6*  HCT 22.3* 22.2* 22.8* 22.9* 22.9*  MCV 80.8 80.1 79.2 79.5 79.5  PLT 322 315 352 370 [id number]*   Basic Metabolic Panel:  Recent Labs Lab 11/17/16 0613 11/18/16 0836  11/19/16 0426 11/20/16 0441 11/21/16 0430 11/23/16 0500  NA 139 136 136 135 133* 136  K 3.9 3.9 3.8 3.7 4.0 4.1  CL 93* 103 104 101 102 104  CO2 24 27 27 28 25 25   GLUCOSE 152* 113* 114* 121* 132* 133*  BUN 15 20 18 17 19  23*  CREATININE 0.63 0.60 0.59 0.49 0.44 0.53  CALCIUM 7.0* 7.9* 7.9* 8.0* 8.0* 8.4*  MG 1.6*  --  1.5* 1.5* 2.0 1.7  PHOS  --   --   --  3.4 3.6 4.0   GFR: Estimated Creatinine Clearance: 55.4 mL/min (by C-G formula based on SCr of 0.53 mg/dL). Liver Function Tests:  Recent Labs Lab 11/20/16 0441 11/23/16 0500  AST 23 49*  ALT 22 42  ALKPHOS 179* 253*  BILITOT 0.7 1.2  PROT 6.0* 6.4*  ALBUMIN 1.8* 2.1*   No results for input(s): LIPASE, AMYLASE in the last 168 hours. No results for input(s): AMMONIA in the last 168 hours. Coagulation Profile: No results for input(s): INR, PROTIME in the last 168 hours. Cardiac Enzymes:  Recent Labs Lab 11/20/16 0833 11/20/16 1743 11/20/16 2047 11/21/16 1406  TROPONINI 0.04* 0.05* 0.05* 0.05*   BNP (last 3 results) No results for input(s): PROBNP in the last 8760 hours. HbA1C: No results for input(s): HGBA1C in the last 72 hours. CBG:  Recent Labs Lab 11/22/16 0535 11/22/16 1511 11/22/16 2213 11/23/16 0539 11/23/16 1456  GLUCAP 119* 132* 128* 127* 127*   Lipid Profile: No results for input(s): CHOL, HDL, LDLCALC, TRIG, CHOLHDL, LDLDIRECT in the last 72 hours. Thyroid Function Tests: No results for input(s): TSH, T4TOTAL, FREET4, T3FREE, THYROIDAB in the last 72 hours. Anemia Panel:  Recent Labs  11/22/16 0646  VITAMINB12 5,166*  FOLATE 11.0  FERRITIN 99  TIBC 237*  IRON 16*  RETICCTPCT 5.1*   Sepsis Labs: No results for input(s): PROCALCITON, LATICACIDVEN in the last 168 hours.  Recent Results (from the past 240 hour(s))  Urine culture     Status: None   Collection Time: 11/20/16  8:51 AM  Result Value Ref Range Status   Specimen Description URINE, CATHETERIZED  Final   Special  Requests NONE  Final   Culture   Final    NO GROWTH Performed at [facility], [address], [place], [place] [address]    Report Status 11/21/2016 FINAL  Final  MRSA PCR Screening     Status: None   Collection Time: 11/20/16  3:50 PM  Result Value Ref Range Status   MRSA by PCR NEGATIVE NEGATIVE Final    Comment:        The GeneXpert MRSA Assay (FDA approved for NASAL specimens only), is one component of a comprehensive MRSA colonization surveillance program. It is not intended to diagnose MRSA infection nor to guide or monitor treatment for MRSA infections.          Radiology Studies:  No results found.      Scheduled Meds: . enoxaparin (LOVENOX) injection  40 mg Subcutaneous Q24H  . fentaNYL   Intravenous Q4H  . insulin aspart  0-9 Units Subcutaneous Q8H  . iopamidol      . iopamidol      . metoprolol  5 mg Intravenous Q6H  . nitroGLYCERIN  1 inch Topical Q6H  . pantoprazole (PROTONIX) IV  80 mg Intravenous Q12H  . sodium chloride       Continuous Infusions: . Marland KitchenTPN (CLINIMIX-E) Adult     And  . fat emulsion    . lactated ringers 75 mL/hr at 11/23/16 0436  . Marland KitchenTPN (CLINIMIX-E) Adult 75 mL/hr at 11/22/16 1746     LOS: 26 days       Mauricio Gerome Apley, MD [facility] Pager [phone]  If 7PM-7AM, please contact night-coverage [email or web address] Password [facility] 11/23/2016, 3:31 PM

## 2016-11-23 NOTE — Progress Notes (Signed)
CT does not show any intraperitoneal source of infection. There is some fluid under the midline wound in the subcutaneous tissues which is not clinically apparent and may be a seroma.  Will continue to re-evaluate wound but it has not shown evidence of infection up to this point.   Given the FUO and rising WBC, will start her on empiric Zosyn.

## 2016-11-23 NOTE — Progress Notes (Signed)
Patient's NG tube was found pulled out, to the point that it was no longer in her stomach.  When NG tube was attempted to be advanced, patient began screaming in pain, and would not let tube be inserted any further.  Family and patient demanding to have NG tube taken out and reinserted.  When NG tube attempted to be reinserted, patient refused to have tube even be put near her nose.  Patient refused NG tube insertion at this time.  Will check back later to see if she changes her mind.

## 2016-11-24 ENCOUNTER — Inpatient Hospital Stay (HOSPITAL_COMMUNITY): Payer: Medicare Other

## 2016-11-24 ENCOUNTER — Inpatient Hospital Stay (HOSPITAL_COMMUNITY): Payer: Medicare Other | Admitting: Anesthesiology

## 2016-11-24 ENCOUNTER — Encounter (HOSPITAL_COMMUNITY): Admission: EM | Disposition: A | Discharge status: Skilled Nursing Facility | Payer: Self-pay | Source: Home / Self Care

## 2016-11-24 ENCOUNTER — Encounter (HOSPITAL_COMMUNITY): Payer: Self-pay | Admitting: Anesthesiology

## 2016-11-24 DIAGNOSIS — Z9049 Acquired absence of other specified parts of digestive tract: Secondary | ICD-10-CM

## 2016-11-24 DIAGNOSIS — J449 Chronic obstructive pulmonary disease, unspecified: Secondary | ICD-10-CM

## 2016-11-24 DIAGNOSIS — K562 Volvulus: Secondary | ICD-10-CM

## 2016-11-24 DIAGNOSIS — K565 Intestinal adhesions [bands], unspecified as to partial versus complete obstruction: Secondary | ICD-10-CM

## 2016-11-24 DIAGNOSIS — I10 Essential (primary) hypertension: Secondary | ICD-10-CM

## 2016-11-24 DIAGNOSIS — K810 Acute cholecystitis: Secondary | ICD-10-CM

## 2016-11-24 DIAGNOSIS — Z9889 Other specified postprocedural states: Secondary | ICD-10-CM

## 2016-11-24 HISTORY — PX: LAPAROTOMY: SHX154

## 2016-11-24 LAB — BASIC METABOLIC PANEL
ANION GAP: 7 (ref 5–15)
BUN: 20 mg/dL (ref 6–20)
CHLORIDE: 103 mmol/L (ref 101–111)
CO2: 25 mmol/L (ref 22–32)
Calcium: 8 mg/dL — ABNORMAL LOW (ref 8.9–10.3)
Creatinine, Ser: 0.59 mg/dL (ref 0.44–1.00)
GFR calc Af Amer: >60 mL/min (ref >60)
Glucose, Bld: 131 mg/dL — ABNORMAL HIGH (ref 65–99)
POTASSIUM: 3.9 mmol/L (ref 3.5–5.1)
Sodium: 135 mmol/L (ref 135–145)

## 2016-11-24 LAB — GLUCOSE, CAPILLARY
GLUCOSE-CAPILLARY: 176 mg/dL — AB (ref 65–99)
Glucose-Capillary: 143 mg/dL — ABNORMAL HIGH (ref 65–99)

## 2016-11-24 LAB — CBC
HCT: 20.3 % — ABNORMAL LOW (ref 36.0–46.0)
HEMOGLOBIN: 6.7 g/dL — AB (ref 12.0–15.0)
MCH: 26.1 pg (ref 26.0–34.0)
MCHC: 33 g/dL (ref 30.0–36.0)
MCV: 79 fL (ref 78.0–100.0)
PLATELETS: 408 10*3/uL — AB (ref 150–400)
RBC: 2.57 MIL/uL — ABNORMAL LOW (ref 3.87–5.11)
RDW: 17.4 % — ABNORMAL HIGH (ref 11.5–15.5)
WBC: 24.4 10*3/uL — AB (ref 4.0–10.5)

## 2016-11-24 LAB — PROCALCITONIN: PROCALCITONIN: 0.56 ng/mL

## 2016-11-24 LAB — PREPARE RBC (CROSSMATCH)

## 2016-11-24 SURGERY — LAPAROTOMY, EXPLORATORY
Anesthesia: General | Site: Abdomen

## 2016-11-24 MED ORDER — FENTANYL CITRATE (PF) 100 MCG/2ML IJ SOLN
INTRAMUSCULAR | Status: DC | PRN
  Administered 2016-11-24: 50 ug via INTRAVENOUS
  Administered 2016-11-24: 150 ug via INTRAVENOUS

## 2016-11-24 MED ORDER — ROCURONIUM BROMIDE 50 MG/5ML IV SOSY
PREFILLED_SYRINGE | INTRAVENOUS | Status: AC
  Filled 2016-11-24: qty 5

## 2016-11-24 MED ORDER — FAT EMULSION 20 % IV EMUL
240.0000 mL | INTRAVENOUS | Status: AC
  Administered 2016-11-24: 240 mL via INTRAVENOUS
  Filled 2016-11-24: qty 250

## 2016-11-24 MED ORDER — FAT EMULSION 20 % IV EMUL
250.0000 mL | INTRAVENOUS | Status: DC
  Filled 2016-11-24: qty 250

## 2016-11-24 MED ORDER — FENTANYL CITRATE (PF) 100 MCG/2ML IJ SOLN
25.0000 ug | INTRAMUSCULAR | Status: DC | PRN
  Administered 2016-11-24 (×3): 50 ug via INTRAVENOUS

## 2016-11-24 MED ORDER — ONDANSETRON HCL 4 MG/2ML IJ SOLN
INTRAMUSCULAR | Status: AC
  Filled 2016-11-24: qty 2

## 2016-11-24 MED ORDER — SUCCINYLCHOLINE CHLORIDE 200 MG/10ML IV SOSY
PREFILLED_SYRINGE | INTRAVENOUS | Status: AC
  Filled 2016-11-24: qty 10

## 2016-11-24 MED ORDER — DEXAMETHASONE SODIUM PHOSPHATE 10 MG/ML IJ SOLN
INTRAMUSCULAR | Status: AC
  Filled 2016-11-24: qty 1

## 2016-11-24 MED ORDER — HYDRALAZINE HCL 20 MG/ML IJ SOLN
10.0000 mg | INTRAMUSCULAR | Status: DC | PRN
  Administered 2016-11-24: 10 mg via INTRAVENOUS
  Administered 2016-11-25: 20 mg via INTRAVENOUS
  Administered 2016-11-26: 30 mg via INTRAVENOUS
  Administered 2016-11-26 (×2): 20 mg via INTRAVENOUS
  Filled 2016-11-24: qty 1
  Filled 2016-11-24: qty 2
  Filled 2016-11-24 (×4): qty 1

## 2016-11-24 MED ORDER — LACTATED RINGERS IV SOLN
INTRAVENOUS | Status: DC | PRN
  Administered 2016-11-24: 09:00:00 via INTRAVENOUS

## 2016-11-24 MED ORDER — MORPHINE SULFATE (PF) 2 MG/ML IV SOLN: 1.0000 mg | INTRAVENOUS | Status: DC | PRN

## 2016-11-24 MED ORDER — TRACE MINERALS CR-CU-MN-SE-ZN 10-1000-500-60 MCG/ML IV SOLN
INTRAVENOUS | Status: AC
  Administered 2016-11-24: 17:00:00 via INTRAVENOUS
  Filled 2016-11-24 (×2): qty 1800

## 2016-11-24 MED ORDER — LIDOCAINE 2% (20 MG/ML) 5 ML SYRINGE
INTRAMUSCULAR | Status: AC
  Filled 2016-11-24: qty 5

## 2016-11-24 MED ORDER — FENTANYL CITRATE (PF) 250 MCG/5ML IJ SOLN
INTRAMUSCULAR | Status: AC
  Filled 2016-11-24: qty 5

## 2016-11-24 MED ORDER — MORPHINE SULFATE (PF) 4 MG/ML IV SOLN
1.0000 mg | INTRAVENOUS | Status: DC | PRN
  Administered 2016-11-24 (×7): 4 mg via INTRAVENOUS
  Administered 2016-11-24: 2 mg via INTRAVENOUS
  Administered 2016-11-24 – 2016-11-25 (×5): 4 mg via INTRAVENOUS
  Filled 2016-11-24 (×14): qty 1

## 2016-11-24 MED ORDER — PIPERACILLIN-TAZOBACTAM 3.375 G IVPB
INTRAVENOUS | Status: AC
  Filled 2016-11-24: qty 50

## 2016-11-24 MED ORDER — PROPOFOL 10 MG/ML IV BOLUS
INTRAVENOUS | Status: AC
  Filled 2016-11-24: qty 40

## 2016-11-24 MED ORDER — CEFOTETAN DISODIUM-DEXTROSE 2-2.08 GM-% IV SOLR
INTRAVENOUS | Status: AC
  Filled 2016-11-24: qty 50

## 2016-11-24 MED ORDER — SODIUM CHLORIDE 0.9 % IV SOLN
Freq: Once | INTRAVENOUS | Status: AC
  Administered 2016-11-24: 11:00:00 via INTRAVENOUS

## 2016-11-24 MED ORDER — ROCURONIUM BROMIDE 10 MG/ML (PF) SYRINGE
PREFILLED_SYRINGE | INTRAVENOUS | Status: DC | PRN
  Administered 2016-11-24: 50 mg via INTRAVENOUS
  Administered 2016-11-24: 10 mg via INTRAVENOUS

## 2016-11-24 MED ORDER — LIDOCAINE 2% (20 MG/ML) 5 ML SYRINGE
INTRAMUSCULAR | Status: DC | PRN
  Administered 2016-11-24: 100 mg via INTRAVENOUS

## 2016-11-24 MED ORDER — ONDANSETRON HCL 4 MG/2ML IJ SOLN
INTRAMUSCULAR | Status: DC | PRN
  Administered 2016-11-24: 4 mg via INTRAVENOUS

## 2016-11-24 MED ORDER — SODIUM CHLORIDE 0.9 % IV SOLN: 10.0000 mL/h | Freq: Once | INTRAVENOUS | Status: DC

## 2016-11-24 MED ORDER — PROPOFOL 10 MG/ML IV BOLUS
INTRAVENOUS | Status: DC | PRN
  Administered 2016-11-24: 140 mg via INTRAVENOUS

## 2016-11-24 MED ORDER — HYDRALAZINE HCL 20 MG/ML IJ SOLN
10.0000 mg | Freq: Once | INTRAMUSCULAR | Status: AC
  Administered 2016-11-24: 10 mg via INTRAVENOUS
  Filled 2016-11-24: qty 1

## 2016-11-24 MED ORDER — SUGAMMADEX SODIUM 200 MG/2ML IV SOLN
INTRAVENOUS | Status: AC
  Filled 2016-11-24: qty 2

## 2016-11-24 MED ORDER — DEXAMETHASONE SODIUM PHOSPHATE 10 MG/ML IJ SOLN
INTRAMUSCULAR | Status: DC | PRN
  Administered 2016-11-24: 10 mg via INTRAVENOUS

## 2016-11-24 MED ORDER — FENTANYL CITRATE (PF) 100 MCG/2ML IJ SOLN
INTRAMUSCULAR | Status: AC
  Filled 2016-11-24: qty 2

## 2016-11-24 MED ORDER — CEFOTETAN DISODIUM 2 G IJ SOLR
INTRAMUSCULAR | Status: DC | PRN
  Administered 2016-11-24: 2 g via INTRAVENOUS

## 2016-11-24 MED ORDER — SUGAMMADEX SODIUM 200 MG/2ML IV SOLN
INTRAVENOUS | Status: DC | PRN
  Administered 2016-11-24: 200 mg via INTRAVENOUS

## 2016-11-24 MED ORDER — SODIUM CHLORIDE 0.9 % IR SOLN
Status: DC | PRN
  Administered 2016-11-24 (×4): 1000 mL

## 2016-11-24 SURGICAL SUPPLY — 48 items
BLADE EXTENDED COATED 6.5IN (ELECTRODE) ×3 IMPLANT
CHLORAPREP W/TINT 26ML (MISCELLANEOUS) IMPLANT
COVER MAYO STAND STRL (DRAPES) ×3 IMPLANT
COVER SURGICAL LIGHT HANDLE (MISCELLANEOUS) ×3 IMPLANT
DRAIN CHANNEL 19F RND (DRAIN) IMPLANT
DRAPE LAPAROSCOPIC ABDOMINAL (DRAPES) ×3 IMPLANT
DRAPE LG THREE QUARTER DISP (DRAPES) IMPLANT
DRAPE UTILITY XL STRL (DRAPES) ×3 IMPLANT
DRAPE WARM FLUID 44X44 (DRAPE) ×3 IMPLANT
DRSG OPSITE POSTOP 4X10 (GAUZE/BANDAGES/DRESSINGS) ×3 IMPLANT
ELECT REM PT RETURN 15FT ADLT (MISCELLANEOUS) ×3 IMPLANT
EVACUATOR SILICONE 100CC (DRAIN) IMPLANT
GAUZE SPONGE 4X4 12PLY STRL (GAUZE/BANDAGES/DRESSINGS) IMPLANT
GLOVE BIO SURGEON STRL SZ7 (GLOVE) ×3 IMPLANT
GLOVE BIOGEL PI IND STRL 7.5 (GLOVE) ×1 IMPLANT
GLOVE BIOGEL PI INDICATOR 7.5 (GLOVE) ×2
GLOVE ECLIPSE 6.5 STRL STRAW (GLOVE) ×3 IMPLANT
GLOVE ECLIPSE 8.0 STRL XLNG CF (GLOVE) ×6 IMPLANT
GLOVE INDICATOR 6.5 STRL GRN (GLOVE) ×3 IMPLANT
GLOVE INDICATOR 8.0 STRL GRN (GLOVE) ×3 IMPLANT
GLOVE SURG SS PI 7.5 STRL IVOR (GLOVE) ×6 IMPLANT
GOWN STRL REUS W/TWL XL LVL3 (GOWN DISPOSABLE) ×9 IMPLANT
HANDLE SUCTION POOLE (INSTRUMENTS) ×1 IMPLANT
KIT BASIN OR (CUSTOM PROCEDURE TRAY) ×3 IMPLANT
LEGGING LITHOTOMY PAIR STRL (DRAPES) IMPLANT
PACK GENERAL/GYN (CUSTOM PROCEDURE TRAY) ×3 IMPLANT
PAD POSITIONING PINK XL (MISCELLANEOUS) IMPLANT
REMOVER STAPLE SKIN (DISPOSABLE) ×3 IMPLANT
SEALER TISSUE X1 CVD JAW (INSTRUMENTS) IMPLANT
SPONGE LAP 18X18 X RAY DECT (DISPOSABLE) ×9 IMPLANT
STAPLER VISISTAT 35W (STAPLE) ×3 IMPLANT
SUCTION POOLE HANDLE (INSTRUMENTS) ×3
SUT ETHILON 3 0 PS 1 (SUTURE) IMPLANT
SUT NOVA 1 T20/GS 25DT (SUTURE) IMPLANT
SUT PDS AB 1 CTX 36 (SUTURE) ×6 IMPLANT
SUT PDS AB 1 TP1 96 (SUTURE) IMPLANT
SUT SILK 2 0 (SUTURE) ×2
SUT SILK 2 0 SH CR/8 (SUTURE) ×3 IMPLANT
SUT SILK 2-0 18XBRD TIE 12 (SUTURE) ×1 IMPLANT
SUT SILK 3 0 (SUTURE) ×2
SUT SILK 3 0 SH CR/8 (SUTURE) ×3 IMPLANT
SUT SILK 3-0 18XBRD TIE 12 (SUTURE) ×1 IMPLANT
SUT VIC AB 2-0 SH 18 (SUTURE) IMPLANT
SUT VIC AB 3-0 SH 18 (SUTURE) IMPLANT
TOWEL OR 17X26 10 PK STRL BLUE (TOWEL DISPOSABLE) ×6 IMPLANT
TOWEL OR NON WOVEN STRL DISP B (DISPOSABLE) ×3 IMPLANT
TRAY FOLEY BAG SILVER LF 14FR (CATHETERS) IMPLANT
TRAY FOLEY W/METER SILVER 16FR (SET/KITS/TRAYS/PACK) IMPLANT

## 2016-11-24 NOTE — Progress Notes (Signed)
Notified Lyndle Herrlich, MD of patient status and B/P, ok to transfer to ICU

## 2016-11-24 NOTE — Anesthesia Postprocedure Evaluation (Signed)
Anesthesia Post Note  Patient: Melanie [name]  Procedure(s) Performed: Procedure(s) (LRB): RE DO EXPLORATORY LAPAROTOMY (N/A)  Patient location during evaluation: PACU Anesthesia Type: General Level of consciousness: awake Pain management: satisfactory to patient Vital Signs Assessment: post-procedure vital signs reviewed and stable Respiratory status: spontaneous breathing Cardiovascular status: stable Anesthetic complications: no       Last Vitals:  Vitals:   11/24/16 1500 11/24/16 1600  BP: (!) 170/57 (!) 179/61  Pulse: 100 96  Resp: 18 20  Temp:      Last Pain:  Vitals:   11/24/16 1601  TempSrc:   PainSc: 8                  Gissell Barra EDWARD

## 2016-11-24 NOTE — Progress Notes (Signed)
Patient began C/O chest pain after giving report and when labs tech began to obtain blood cultures. ICU nurses at bedside and beginning ICU protocol. Patient has voiced no C/O chest pain dureing stay in PACU or transit

## 2016-11-24 NOTE — Consult Note (Signed)
PULMONARY / CRITICAL CARE MEDICINE   Name: Melanie [name] MRN: [id number] DOB: Mar 25, 1936    ADMISSION DATE:  10/28/2016 CONSULTATION DATE:  11/24/16  REFERRING MD:  Dr. Zella Richer   CHIEF COMPLAINT:  Possible Sepsis   HISTORY OF PRESENT ILLNESS:   81 y/o F admitted 1/20 with RUQ pain secondary to known gallstones, nausea & vomiting.  PMH of COPD, HTN & HLD. She was admitted per [facility] for further evaluation.  She was treated empirically with antibiotics.  MRCP was negative for radiographic evidence of choledocholithiasis / other obstructing etiology.  CCS evaluated the patient and was taken to the OR on 1/22 for laparoscopic cholecystectomy with intraoperative cholangiogram.  Initial post-operative course was uneventful.  CT 1/25 showed possible post-operative ileus.  On 1/26 she began developing crampy abdominal pain and diarrhea.  WBC increased into the 20's.  HIDA scan completed 1/27 and was negative for bile leak. She developed abdominal distention and increased pain.  NGT was placed for decompression.  PICC line was placed and she was started on PTN 1/31.  She continued to have high NGT output and repeat CT was completed 2/2 which showed persistent small bowel dilation. NGT was clamped 2/2 but she did not tolerate and developed nausea / vomiting.  Narcotics were decreased.  GI was consulted 2/6 with recommendations of keeping potassium > 4, addition of pro-biotic and small frequent clear liquids.  Unfortunately, she continued to have abdominal pain and KUB was consistent with ileus vs SBO.  She returned to the OR on 2/9 for exploratory laparotomy and lysis of adhesions.  She was found to have a mechanical small bowel obstruction secondary to adhesions.  Post-operatively, pain was controlled with PCA. NGT and bowel rest continued. Course was complicated by anemia, malnutrition and prolonged ileus and atypical chest pain thought not to be cardiac in orgin.  She developed fever 2/14-2/15, abdominal pain and  increased WBC. Given fever and increased WBC, CCS was concerned for sepsis.  With PCA on 2/14 it was documented she received 67 mg of morphine.  Repeat CT ABD 2/15 shows mild small bowel dilation consistent with ileus and SBO.  Given abdominal pain and no clear source of sepsis, she returned to the OR on 2/16 for second exploratory laparotomy and was found to have a volvulus requiring reduction.    PCCM consulted for evaluation.       PAST MEDICAL HISTORY :  She  has a past medical history of COPD (chronic obstructive pulmonary disease) ([place]); Hyperlipidemia; Hypertension; Small bowel obstruction due to adhesions (11/17/2016); and Uterine fibroid.  PAST SURGICAL HISTORY: She  has a past surgical history that includes Joint replacement; Back surgery; bone removed from skull (Right, 1957); Cholecystectomy (N/A, 10/30/2016); and laparotomy (N/A, 11/17/2016).  Allergies  Allergen Reactions  . Nsaids     hypertension  . Ace Inhibitors Other (See Comments)    cough    No current facility-administered medications on file prior to encounter.    Current Outpatient Prescriptions on File Prior to Encounter  Medication Sig  . amLODipine (NORVASC) 10 MG tablet Take 0.5 mg by mouth daily.  . cloNIDine (CATAPRES) 0.1 MG tablet Take 0.1 mg by mouth daily.   . hydrochlorothiazide (HYDRODIURIL) 25 MG tablet Take 25 mg by mouth daily.  . Multiple Vitamin (MULTIVITAMIN) tablet Take 1 tablet by mouth daily.  . nebivolol (BYSTOLIC) 5 MG tablet Take 5 mg by mouth daily.    FAMILY HISTORY:  Her indicated that the status of her mother  is unknown.    SOCIAL HISTORY: She  reports that she quit smoking about 18 years ago. She has never used smokeless tobacco. She reports that she drinks alcohol. She reports that she does not use drugs.  REVIEW OF SYSTEMS:  POSITIVES IN BOLD Gen: Denies fever, chills, weight change, fatigue, night sweats HEENT: Denies blurred vision, double vision, hearing loss, tinnitus,  sinus congestion, rhinorrhea, sore throat, neck stiffness, dysphagia PULM: Denies shortness of breath, cough, sputum production, hemoptysis, wheezing CV: Denies chest pain, edema, orthopnea, paroxysmal nocturnal dyspnea, palpitations GI: Denies abdominal pain, nausea, vomiting, diarrhea, hematochezia, melena, constipation, change in bowel habits GU: Denies dysuria, hematuria, polyuria, oliguria, urethral discharge Endocrine: Denies hot or cold intolerance, polyuria, polyphagia or appetite change Derm: Denies rash, dry skin, scaling or peeling skin change Heme: Denies easy bruising, bleeding, bleeding gums Neuro: Denies headache, numbness, weakness, slurred speech, loss of memory or consciousness   SUBJECTIVE:  Pt denies acute issues post-op in PACU  VITAL SIGNS: BP (!) 188/84 (BP Location: Left Arm)   Pulse (!) 101   Temp 98.7 F (37.1 C)   Resp 17   Ht 5\' 2"  (1.575 m)   Wt 168 lb 6.9 oz (76.4 kg)   SpO2 100%   BMI 30.81 kg/m   HEMODYNAMICS:    VENTILATOR SETTINGS:    INTAKE / OUTPUT: I/O last 3 completed shifts: In: 5274.3 [P.O.:450; I.V.:4374.3; IV Piggyback:450] Out: 900 [Urine:900]  PHYSICAL EXAMINATION: General:  Elderly female in NAD lying in bed  Neuro:  AAOx4, speech clear, MAE  HEENT:  MM pink/moist, no jvd Cardiovascular:  s1s2 rrr, no m/r/g  Lungs:  Even/non-labored on [place] O2, lungs clear bilaterally  Abdomen:  Obese/soft, midline abd incision Musculoskeletal:  No acute deformities  Skin:  Warm/dry, no edema   LABS:  BMET  Recent Labs Lab 11/21/16 0430 11/23/16 0500 11/24/16 0557  NA 133* 136 135  K 4.0 4.1 3.9  CL 102 104 103  CO2 25 25 25   BUN 19 23* 20  CREATININE 0.44 0.53 0.59  GLUCOSE 132* 133* 131*    Electrolytes  Recent Labs Lab 11/20/16 0441 11/21/16 0430 11/23/16 0500 11/24/16 0557  CALCIUM 8.0* 8.0* 8.4* 8.0*  MG 1.5* 2.0 1.7  --   PHOS 3.4 3.6 4.0  --     CBC  Recent Labs Lab 11/22/16 0646 11/23/16 0026  11/24/16 0557  WBC 17.8* 20.1* 24.4*  HGB 7.4* 7.6* 6.7*  HCT 22.9* 22.9* 20.3*  PLT 370 405* 408*    Coag's No results for input(s): APTT, INR in the last 168 hours.  Sepsis Markers No results for input(s): LATICACIDVEN, PROCALCITON, O2SATVEN in the last 168 hours.  ABG No results for input(s): PHART, PCO2ART, PO2ART in the last 168 hours.  Liver Enzymes  Recent Labs Lab 11/20/16 0441 11/23/16 0500  AST 23 49*  ALT 22 42  ALKPHOS 179* 253*  BILITOT 0.7 1.2  ALBUMIN 1.8* 2.1*    Cardiac Enzymes  Recent Labs Lab 11/20/16 1743 11/20/16 2047 11/21/16 1406  TROPONINI 0.05* 0.05* 0.05*    Glucose  Recent Labs Lab 11/22/16 1511 11/22/16 2213 11/23/16 0539 11/23/16 1456 11/23/16 2140 11/24/16 0703  GLUCAP 132* 128* 127* 127* 134* 143*    Imaging Ct Abdomen Pelvis W Contrast  Result Date: 11/23/2016 CLINICAL DATA:  Persistent generalized abdominal pain. EXAM: CT ABDOMEN AND PELVIS WITH CONTRAST TECHNIQUE: Multidetector CT imaging of the abdomen and pelvis was performed using the standard protocol following bolus administration of intravenous contrast. CONTRAST:  139mL ISOVUE-300 IOPAMIDOL (ISOVUE-300) INJECTION 61% COMPARISON:  CT scan of November 10, 2016. FINDINGS: Lower chest: No acute abnormality. Hepatobiliary: No focal liver abnormality is seen. Status post cholecystectomy. No biliary dilatation. Pancreas: Unremarkable. No pancreatic ductal dilatation or surrounding inflammatory changes. Spleen: Normal in size without focal abnormality. Adrenals/Urinary Tract: Adrenal glands are unremarkable. No hydronephrosis or renal obstruction is noted. Foley catheter is noted in the urinary bladder. Stomach/Bowel: Mild small bowel dilatation is noted consistent with ileus or possibly small bowel obstruction. Vascular/Lymphatic: Aortic atherosclerosis. No enlarged abdominal or pelvic lymph nodes. Reproductive: Uterus and bilateral adnexa are unremarkable. Other: Mild anasarca  is noted. No abnormal fluid collection is noted. Musculoskeletal: Status post surgical posterior fusion extending from L2-L5. IMPRESSION: Aortic atherosclerosis. Mild anasarca. Stable mild small bowel dilatation is noted consistent with ileus or possible small bowel obstruction. Electronically Signed   By: Marijo Conception, M.D.   On: 11/23/2016 17:13     STUDIES: MRCP 1/22 >> Distended gallbladder contains tiny gallstones and sludge . Shows mild diffuse wall thickening, suspicious for acute cholecystitis. No radiographic evidence of choledocholithiasis or other obstructing etiology. CT Chest 2/12 >> Negative for PE or acute disease. Emphysema. Atherosclerosis. Body wall edema compatible with anasarca.  CT A/P 2/15 >> Aortic atherosclerosis. Mild anasarca. Stable mild bowel dilation c/w ileus or possible SBO.   CULTURES: UA 1/26 >> rare bacteria, neg nitrates and leuks, 30 protein, small bili C diff 1/27 >> negative UA 2/12 >> negative Urine culture 2/13 >> no growth BCx2 2/16 >>  ANTIBIOTICS: Cefotetan 02/09 >> 02/09 Zosyn 01/21 >> 01/24, 1/27 >> 02/09,  02/15 >>    SIGNIFICANT EVENTS: 01/20  Admit with gangrenous gallbladder 01/22  Lap Chole with intra operative cholangiogram finding necrotic gallbladder with free intraperitoneal bile 01/25  Post op ileus and increasing WBC 01/30  Worsening abd pain, KUB showed post-op ileus 01/31 TPN started 02/09  Exploratory lap, lysis of adhesions 02/12  Febrile (tmax 100.1), tachycardic (104)  02/16  Exploratory lap 2/2 sxs of acute abd, found to 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 PCCM consulted for concern for sepsis.    ASSESSMENT /  PLAN:  PULMONARY A: At Risk Atelectasis  P:   Pulmonary hygiene - IS, mobilize Reduce narcotics   CARDIOVASCULAR A:  Hypertension  Atypical Chest Pain - non cardiac in origin  P:  SDU monitoring  Continue lopressor, NTG ointment   RENAL A:   No Acute Issues  P:   Trend BMP / UOP  Replace electrolytes as indicated  GASTROINTESTINAL A:   Necrotic Gallbladder s/p Cholecystectomy  Ex-lap x2 SBO with Adhesions Volvulus with Jejunal Segment Ischemia P:   Per CCS NGT to LIS  Minimize narcotics TPN per Pharmacy   HEMATOLOGIC A:   Anemia  P:  Trend CBC  Transfuse per guidelines  Monitor LFT's  INFECTIOUS A:   Sepsis Response - suspect secondary to volvulus with ischemia.   Chronic Knee Infection on ABX Suppression - has been on for years and was told to remain on "lifelong" P:   Await blood cultures  Continue Zosyn, D2/x Hold outpatient amoxicillin Follow PCT  ENDOCRINE A:   At Risk Hypogylcemia   P:   Monitor glucose  on BMP   NEUROLOGIC A:   Pain  P:   RASS goal: n/a PRN morphine for pain  Need to reduce narcotics if able    FAMILY  - Updates: Patient updated on plan of care.   - Inter-disciplinary family meet or Palliative Care meeting due by:  2/24    Noe Gens, NP-C [facility] Pgr: [phone] or if no answer [phone] 11/24/2016, 12:44 PM

## 2016-11-24 NOTE — Progress Notes (Signed)
This shift pt continues to have pain and not manage PCA pump effectively.  Pain minimally managed when IV morphine given.This RN paged CCS MD on call regarding pain status. Received orders to discontinue PCA pump, new orders given for IV pain medication

## 2016-11-24 NOTE — Progress Notes (Signed)
PROGRESS NOTE    Melanie [name]  [id number] DOB: [birth year] DOA: 10/28/2016 PCP: Osborne Casco, MD    Brief Narrative:  81 yo female with prolonged hospitalization, admitted on 01/20 for necrotic cholecystitis. Developed post op bowel obstruction, re-intervened on 02/09 with exploratory laparotomy. NG tube in place, on TPN. Consulted for chest pain that ruled out for ACS. Patient has been in persistent pain, on PCA pump. Patient had third surgical intervention, emergent laparatomy, recurrent bowel obstruction with volvulus with ischemic segment.    Assessment & Plan:   Principal Problem:   Small bowel obstruction s/p ex lap & lysis of adhesions 11/17/2016 Active Problems:   Necrotic cholecystitis s/p lap cholecystectomy 10/30/2016   Essential hypertension   Hyperlipidemia   COPD (chronic obstructive pulmonary disease) (HCC)   GERD (gastroesophageal reflux disease)   Chest pain   H/O exploratory laparotomy   1. Necrotic cholecystitis.  NG tube in place, post-operative bowel obstruction requiring surgical intervention.   3. Post operative bowel obstruction complicated with volvulus and ischemic bowel. Sp exploratory laparotomy, will continue pain control, IV fluids and IV antibiotics. NG tube to low intermittent suction. NPO. Worsening leukocytosis  4. HTN uncontrolled. Elevated blood pressure due to pain, will continue as needed hydralazine, continue pain control. Noted systolic blood pressure up to 200 at 1400 hrs.   5. COPD. Continue oxymetry monitoring and aspiration precautions. Supplemental 02 per [place].   6. Iron deficiency anemia. Hb down to 6,7, with worsening anemia, patient had blood transfusion 2 units this am, follow on cell count in am.  DVT prophylaxis:enoxaparin  Code Status:full Family Communication:No family at the bedside.  Disposition Plan:Home/ SNF   Consultants:    Procedures:                           02/16 Exploratory laparotomy  with volvulus and ischemic bowel.   02/09 Exploratory laparotomy, lysis of adhesions  01/22 Laparoscopic Cholecystectomy with intraoperative cholangiogram.    Antimicrobials:   Zosyn    Subjective: Patient post op exploratory laparatomy. Positive pain, blood pressure has been elevated, no dyspnea.   Objective: Vitals:   11/24/16 1400 11/24/16 1500 11/24/16 1600 11/24/16 1700  BP: (!) 200/73 (!) 170/57 (!) 179/61 (!) 177/58  Pulse: (!) 104 100 96 (!) 103  Resp: 17 18 20 16   Temp:      TempSrc:      SpO2: 97% 98% 98% 95%  Weight:      Height:        Intake/Output Summary (Last 24 hours) at 11/24/16 1716 Last data filed at 11/24/16 1700  Gross per 24 hour  Intake          4512.25 ml  Output              725 ml  Net          3787.25 ml   Filed Weights   10/28/16 0551 11/08/16 1347 11/20/16 0946  Weight: 70.3 kg (155 lb) 74.3 kg (163 lb 11.2 oz) 76.4 kg (168 lb 6.9 oz)    Examination:  General exam: deconditioned and in pain E ENT: mild pallor, no icterus. Oral mucosa dry, NG tube in place.  Respiratory system: Decreased breath sounds with decreased inspiratory  Effort, no wheezing, rales or rhonchi.  Cardiovascular system: S1 & S2 heard, RRR. No JVD, murmurs, rubs, gallops or clicks. No pedal edema. Gastrointestinal system: Abdomen is tender to palpation, no rebound. No organomegaly or masses  felt. Normal bowel sounds heard. Central nervous system: Alert and oriented. No focal neurological deficits. Extremities: Symmetric 5 x 5 power. Skin: No rashes, lesions or ulcers  Data Reviewed: I have personally reviewed following labs and imaging studies  CBC:  Recent Labs Lab 11/20/16 0441 11/21/16 0430 11/22/16 0646 11/23/16 0026 11/24/16 0557  WBC 17.3* 19.2* 17.8* 20.1* 24.4*  NEUTROABS 14.1*  --   --   --   --   HGB 7.2* 7.5* 7.4* 7.6* 6.7*  HCT 22.2* 22.8* 22.9* 22.9* 20.3*  MCV 80.1 79.2 79.5 79.5 79.0  PLT 315 352 370 405* 123XX123*   Basic Metabolic  Panel:  Recent Labs Lab 11/19/16 0426 11/20/16 0441 11/21/16 0430 11/23/16 0500 11/24/16 0557  NA 136 135 133* 136 135  K 3.8 3.7 4.0 4.1 3.9  CL 104 101 102 104 103  CO2 27 28 25 25 25   GLUCOSE 114* 121* 132* 133* 131*  BUN 18 17 19  23* 20  CREATININE 0.59 0.49 0.44 0.53 0.59  CALCIUM 7.9* 8.0* 8.0* 8.4* 8.0*  MG 1.5* 1.5* 2.0 1.7  --   PHOS  --  3.4 3.6 4.0  --    GFR: Estimated Creatinine Clearance: 55.4 mL/min (by C-G formula based on SCr of 0.59 mg/dL). Liver Function Tests:  Recent Labs Lab 11/20/16 0441 11/23/16 0500  AST 23 49*  ALT 22 42  ALKPHOS 179* 253*  BILITOT 0.7 1.2  PROT 6.0* 6.4*  ALBUMIN 1.8* 2.1*   No results for input(s): LIPASE, AMYLASE in the last 168 hours. No results for input(s): AMMONIA in the last 168 hours. Coagulation Profile: No results for input(s): INR, PROTIME in the last 168 hours. Cardiac Enzymes:  Recent Labs Lab 11/20/16 0833 11/20/16 1743 11/20/16 2047 11/21/16 1406  TROPONINI 0.04* 0.05* 0.05* 0.05*   BNP (last 3 results) No results for input(s): PROBNP in the last 8760 hours. HbA1C: No results for input(s): HGBA1C in the last 72 hours. CBG:  Recent Labs Lab 11/23/16 0539 11/23/16 1456 11/23/16 2140 11/24/16 0703 11/24/16 1341  GLUCAP 127* 127* 134* 143* 176*   Lipid Profile: No results for input(s): CHOL, HDL, LDLCALC, TRIG, CHOLHDL, LDLDIRECT in the last 72 hours. Thyroid Function Tests: No results for input(s): TSH, T4TOTAL, FREET4, T3FREE, THYROIDAB in the last 72 hours. Anemia Panel:  Recent Labs  11/22/16 0646  VITAMINB12 5,166*  FOLATE 11.0  FERRITIN 99  TIBC 237*  IRON 16*  RETICCTPCT 5.1*   Sepsis Labs:  Recent Labs Lab 11/24/16 1522  PROCALCITON 0.56    Recent Results (from the past 240 hour(s))  Urine culture     Status: None   Collection Time: 11/20/16  8:51 AM  Result Value Ref Range Status   Specimen Description URINE, CATHETERIZED  Final   Special Requests NONE  Final    Culture   Final    NO GROWTH Performed at [facility], [place] [address], [place], [place] [address]    Report Status 11/21/2016 FINAL  Final  MRSA PCR Screening     Status: None   Collection Time: 11/20/16  3:50 PM  Result Value Ref Range Status   MRSA by PCR NEGATIVE NEGATIVE Final    Comment:        The GeneXpert MRSA Assay (FDA approved for NASAL specimens only), is one component of a comprehensive MRSA colonization surveillance program. It is not intended to diagnose MRSA infection nor to guide or monitor treatment for MRSA infections.  Radiology Studies: Ct Abdomen Pelvis W Contrast  Result Date: 11/23/2016 CLINICAL DATA:  Persistent generalized abdominal pain. EXAM: CT ABDOMEN AND PELVIS WITH CONTRAST TECHNIQUE: Multidetector CT imaging of the abdomen and pelvis was performed using the standard protocol following bolus administration of intravenous contrast. CONTRAST:  135mL ISOVUE-300 IOPAMIDOL (ISOVUE-300) INJECTION 61% COMPARISON:  CT scan of November 10, 2016. FINDINGS: Lower chest: No acute abnormality. Hepatobiliary: No focal liver abnormality is seen. Status post cholecystectomy. No biliary dilatation. Pancreas: Unremarkable. No pancreatic ductal dilatation or surrounding inflammatory changes. Spleen: Normal in size without focal abnormality. Adrenals/Urinary Tract: Adrenal glands are unremarkable. No hydronephrosis or renal obstruction is noted. Foley catheter is noted in the urinary bladder. Stomach/Bowel: Mild small bowel dilatation is noted consistent with ileus or possibly small bowel obstruction. Vascular/Lymphatic: Aortic atherosclerosis. No enlarged abdominal or pelvic lymph nodes. Reproductive: Uterus and bilateral adnexa are unremarkable. Other: Mild anasarca is noted. No abnormal fluid collection is noted. Musculoskeletal: Status post surgical posterior fusion extending from L2-L5. IMPRESSION: Aortic atherosclerosis. Mild anasarca. Stable mild small  bowel dilatation is noted consistent with ileus or possible small bowel obstruction. Electronically Signed   By: Marijo Conception, M.D.   On: 11/23/2016 17:13        Scheduled Meds: . enoxaparin (LOVENOX) injection  40 mg Subcutaneous Q24H  . fentaNYL      . fentaNYL      . fentaNYL      . insulin aspart  0-9 Units Subcutaneous Q8H  . metoprolol  5 mg Intravenous Q6H  . nitroGLYCERIN  1 inch Topical Q6H  . pantoprazole (PROTONIX) IV  80 mg Intravenous Q12H  . piperacillin-tazobactam (ZOSYN)  IV  3.375 g Intravenous Q8H   Continuous Infusions: . lactated ringers 75 mL/hr at 11/24/16 1600  . Marland KitchenTPN (CLINIMIX-E) Adult 75 mL/hr at 11/24/16 1600  . Marland KitchenTPN (CLINIMIX-E) Adult       LOS: 27 days      Damaso Laday Gerome Apley, MD [facility] Pager [phone]  If 7PM-7AM, please contact night-coverage [email or web address] Password [facility] 11/24/2016, 5:16 PM

## 2016-11-24 NOTE — Progress Notes (Signed)
[facility] Progress Note Patient Name: Melanie [name] DOB: Aug 30, 1936 MRN: [id number]   Date of Service  11/24/2016  HPI/Events of Note  Hypertension - BP = 177/58.  eICU Interventions  Will order: 1. Hydralazine 10 mg IV X 1 (extra dose) now.      Intervention Category Major Interventions: Hypertension - evaluation and management  Sommer,Steven Eugene 11/24/2016, 5:17 PM

## 2016-11-24 NOTE — Progress Notes (Signed)
PT Cancellation Note  Patient Details Name: Melanie [name] MRN: [id number] DOB: 27-Mar-1936   Cancelled Treatment:    Reason Eval/Treat Not Completed: Patient at procedure or test/unavailable (pt has not been able to participate in PT at all this week due to medical decline. Pt in emergency surgery this morning for possible septic bowel. PT will sign off, please re-order when pt able to participate. )   Philomena Doheny 11/24/2016, 10:53 AM  [phone]

## 2016-11-24 NOTE — Anesthesia Procedure Notes (Signed)
Procedure Name: Intubation Date/Time: 11/24/2016 10:00 AM Performed by: Elfego Giammarino, Virgel Gess Pre-anesthesia Checklist: Patient identified, Emergency Drugs available, Suction available, Patient being monitored and Timeout performed Patient Re-evaluated:Patient Re-evaluated prior to inductionOxygen Delivery Method: Circle system utilized Preoxygenation: Pre-oxygenation with 100% oxygen Intubation Type: IV induction Ventilation: Oral airway inserted - appropriate to patient size Laryngoscope Size: Mac and 4 Grade View: Grade II Tube type: Subglottic suction tube Tube size: 7.5 mm Number of attempts: 1 Airway Equipment and Method: Stylet Placement Confirmation: ETT inserted through vocal cords under direct vision,  positive ETCO2,  CO2 detector and breath sounds checked- equal and bilateral Secured at: 21 cm Tube secured with: Tape Dental Injury: Teeth and Oropharynx as per pre-operative assessment

## 2016-11-24 NOTE — Progress Notes (Signed)
CRITICAL VALUE ALERT  Critical value received:  hgb 6.7  Date of notification:  [id number]  Time of notification:  0630  Critical value read back:y  Nurse who received alert:  VMJ  MD notified (1st page):  Marlou Starks  Time of first page:  [phone]  MD notified (2nd page):NA  Time of second page:NA  Responding MD:  Marlou Starks  Time MD responded:  [phone]

## 2016-11-24 NOTE — Progress Notes (Signed)
[facility] NOTE   Pharmacy Consult for TPN Indication: prolonged ileus  Patient Measurements: Height: 5\' 2"  (157.5 cm) Weight: 168 lb 6.9 oz (76.4 kg) IBW/kg (Calculated) : 50.1 TPN AdjBW (KG): 56.7 Body mass index is 30.81 kg/m.  Insulin Requirements: 3 units in the last 24hrs.  Current Nutrition: NPO (small aliquots of clear liquids)  IVF: LR at 75 ml/hr  Central access: PICC TPN start date: 11/08/16  ASSESSMENT                                                                                                          HPI: Patient is [name] with hx of gallstones presented to the ED on 1/20 with c/o abdominal pain. MRCP on 1/22 showed distended gallbladder with tiny gallstones and wall thickening with suspicion for acute cholecystitis. Patient underwent lap cholecystectomy with intraoperative cholangiogram on 1/22.  To start TPN for prolonged ileus and poor oral intake.  Significant events:  1/22: lap chole with cholangiogram 1/30: abd KUB with suspicion for post-op ileus; NG tube placed  2/2: high NG output from 1/31-2/1 (1326ml) but decreased 2/1-2/2 (300 ml). RN reported foul smell from NG tube --CCS may d/c NG 2/3: trial clamping NG - successful 2/4 NGT removed. Switching from E 5/20 to E 5/15 to better align with nutritional goals 2/6 leave NGT out, continue NPO, GI consult 2/8 Xray consistent with ileus or SBO - possible ex lap tomorrow 2/9: Underwent exploratory laparotomy, lysis of adhesions.  2/12 Transferred to ICU for chest pain, elevated troponin. 2/16: back to OR this AM for emergent ex lap for concern for ischemic bowel  Today 11/24/16:   Glucose controlled (goal <150). No hx of DM.  Electrolytes WNL  Renal: scr/BUN ok; CrCl 55 ml/min  LFTs: low; albumin low  TGs: wnl, 69 (2/12)  Prealbumin: 5.9 (2/5), 8.3 (2/12)  NUTRITIONAL GOALS                                                                                              RD recs (2/2) Kcal:  1750-1950 Protein:  80-90g Fluid:  1.8L/day  Clinimix E 5/15 (switching from E 5/20 at 70 ml/hr) at a goal rate of 75 ml/hr + 20% fat emulsion at 20 ml/hr x 12 hr to provide:  90 g/day protein, 1758 Kcal/day.  (+++There is currently a Psychologist, prison and probation services of Clinimix solution. Pharmacy will use Clinimix product based on availability+++)  PLAN  At 1800 today:  Continue Clinimix E 5/15 at goal rate of 75 ml/hr.  20% fat emulsion at 20 ml/hr over 12 hrs  Trace elements are on national shortage, will be added as available. Will add some in today's bag  Multivitamins will be added daily.  Continue sensitive SSI q8h.   TPN lab panels on Mondays & Thursdays.   Adrian Saran, PharmD, BCPS Pager [phone] 11/24/2016 9:21 AM

## 2016-11-24 NOTE — Transfer of Care (Signed)
Immediate Anesthesia Transfer of Care Note  Patient: Melanie [name]  Procedure(s) Performed: Procedure(s): RE DO EXPLORATORY LAPAROTOMY (N/A)  Patient Location: PACU  Anesthesia Type:General  Level of Consciousness:  sedated, patient cooperative and responds to stimulation  Airway & Oxygen Therapy:Patient Spontanous Breathing and Patient connected to face mask oxgen  Post-op Assessment:  Report given to PACU RN and Post -op Vital signs reviewed and stable  Post vital signs:  Reviewed and stable  Last Vitals:  Vitals:   11/24/16 0538 11/24/16 0745  BP: (!) 190/65 (!) 169/72  Pulse: (!) 124 (!) 116  Resp: (!) 22 (!) 23  Temp: 37.8 C 0000000 C    Complications: No apparent anesthesia complications

## 2016-11-24 NOTE — Progress Notes (Signed)
Upon return from PACU pt began complaining of sever chest pain. RN got EKG, gave 4 mg morphine, and robaxin PB. Pt has nitro chest paste on already. Called and notified MD. No new orders. Will continue to monitor pt closely.

## 2016-11-24 NOTE — Progress Notes (Signed)
15 mcg fentanyl wasted with Lindon Romp, RN with  discontinuation of PCA pump. New orders written for pain management. Will continue to monitor and intervene appropriately

## 2016-11-24 NOTE — Progress Notes (Signed)
Pt remains persistently hypertensive. Hydralazine given and sx MD made aware. Instructed to call elink MD. Warren Lacy called and left message with nurse to have MD call back. No new orders at this time. Will continue to monitor pt closely.

## 2016-11-24 NOTE — Op Note (Signed)
Operative Note  Melanie [name] female 81 y.o. 11/24/2016  PREOPERATIVE DX:  Acute abdomen with sepsis  POSTOPERATIVE DX:  Recurrent small bowel obstruction. 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  PROCEDURE:   Emergency exploratory laparotomy, lysis of adhesions, reduction of volvulus.         Surgeon: Odis Hollingshead   Assistants: Thomasene Mohair, RNFA  Anesthesia: General endotracheal anesthesia  Indications:   This is a 81 year old female who underwent laparoscopic cholecystectomy for gangrenous cholecystitis 10/30/2016. Postoperative course was complicated by small bowel obstruction. She underwent exploratory laparotomy with lysis of adhesions 11/17/2016. She is struggled to get better since that time. She's had a rising white count and started developing fever yesterday. Overnight she got severe abdominal pain and tachycardia more progressive leukocytosis, tachypnea, and fever consistent with sepsis. There is no other obvious source of sepsis. She had an acute abdomen on exam. She is now brought to the operating room for emergency exploratory laparotomy.    Procedure Detail:  She was brought to the operating room placed supine on the operating table and general anesthetic was given. A nasogastric tube was inserted. The abdominal wall was widely sterilely prepped and draped. A timeout was performed.  Staples were removed from the midline incision the subcutaneous tissues were inspected. There is no evidence of wound infection. Wound hematoma was present and evacuated. I identified the running PDS suture knots and cut them. I then carefully freed up the PDS suture from the fascia until I reached the proximal and distal end of the incision. The omentum was underlying the fascia. Using careful blunt dissection I was able to free up the omentum from the anterior abdominal wall and the underlying intestine. The proximal small bowel was significantly dilated. I began tracing  this distally until I came to a point of complete obstruction from an adhesion in the right pelvic area. The bowel distal to this was decompressed. The lysis of adhesion and relieve the obstruction. I then identified the ileocecal valve and ran the small bowel retrograde. When I got to the jejunal area appeared to be tethered and I noticed a mesenteric volvulus present. There was a small segment of what appeared to be decompressed bowel that was slightly ischemic. Once I reduced the volvulus however the bowel looked viable again. The bowel proximal to this segment was again.  I then milked the fluid in the dilated small bowel back into the stomach and evacuated. There is approximately 1000 cc of feculent fluid evacuated. I inspected the entire colon and it was viable. The gallbladder fossa did not have an abscess. There was no pelvic abscess. There was some necrotic tissue on the anterior surface of the uterus which I debrided. There is no evidence of intestinal perforation. Overall, there is no evidence of abscess.  I ran the small bowel once again and it was all viable. I then placed back into the abdominal cavity in normal orientation with no mesenteric twisting. The external was then copiously irrigated with saline solution. There is no evidence of bleeding or organ injury. The fascia was then closed with running #1 PDS suture tied to the 2 previous PDS sutures proximally and distally. The subcutaneous tissue was irrigated and the skin was closed with staples. A honeycomb dressing was applied.  She tolerated the procedure without any apparent complications. She was taken to the recovery room in critical condition.  Findings:  Recurrent small bowel obstruction was present. There is a jejunal mesenteric volvulus with a transient  ischemic segment. Once the volvulus was reduced, the segment looked viable.  Estimated Blood Loss:  100 cc         Drains: None  Blood Given: 2 units  PRBC           Specimens: none        Complications:  * No complications entered in OR log *

## 2016-11-24 NOTE — Anesthesia Preprocedure Evaluation (Addendum)
Anesthesia Evaluation  Patient identified by MRN, date of birth, ID band Patient awake    Reviewed: Allergy & Precautions, H&P , Patient's Chart, lab work & pertinent test results, reviewed documented beta blocker date and time   History of Anesthesia Complications (+) PSEUDOCHOLINESTERASE DEFICIENCY  Airway Mallampati: II  TM Distance: >3 FB Neck ROM: full    Dental no notable dental hx.    Pulmonary former smoker,    + rhonchi  + decreased breath sounds      Cardiovascular hypertension,  Rhythm:regular Rate:Normal     Neuro/Psych    GI/Hepatic   Endo/Other    Renal/GU      Musculoskeletal   Abdominal   Peds  Hematology   Anesthesia Other Findings   11/24/2016 05:57 WBC: 24.4 (H) Hemoglobin: 6.7 (  Platelets: 408   Normal LV fxn COPD Normal electrolytes    Reproductive/Obstetrics                            Anesthesia Physical Anesthesia Plan  ASA: III and emergent  Anesthesia Plan: General   Post-op Pain Management:    Induction: Intravenous, Rapid sequence and Cricoid pressure planned  Airway Management Planned: Oral ETT  Additional Equipment: Arterial line  Intra-op Plan:   Post-operative Plan: Extubation in OR  Informed Consent: I have reviewed the patients History and Physical, chart, labs and discussed the procedure including the risks, benefits and alternatives for the proposed anesthesia with the patient or authorized representative who has indicated his/her understanding and acceptance.   Dental Advisory Given and Dental advisory given  Plan Discussed with: CRNA and Surgeon  Anesthesia Plan Comments: (May place CVP if peripheral access is not adequate. We'll need two IV's ; consider a-line Stable induction w/ previous surgery  Discussed general anesthesia, including possible nausea, instrumentation of airway, sore throat,pulmonary aspiration, etc. I  asked if the were any outstanding questions, or  concerns before we proceeded.)        Anesthesia Quick Evaluation

## 2016-11-24 NOTE — Progress Notes (Addendum)
Patient seen and examined with Modena Jansky, [place].  She has sepsis and an acute abdomen this morning.  Pain cannot be well controlled with narcotics.  Despite CT scan findings, I am concerned about ischemic bowel.  I have recommended emergency exploratory laparotomy. The procedure, rationale, and risks have been discussed. The risks include but are not limited to bleeding, infection, wound problems, anesthesia, need for intestinal resection, injury to intra-abdominal organs, negative laparotomy, and death.  Will give PRBCs for her anemia.

## 2016-11-24 NOTE — Progress Notes (Signed)
7 Days Post-Op  Subjective: She looks terrible this AM. She has uncontrolled abdominal pain with no real reason we can see on exam.  She has fever, increasing WBC, tachycardia and appearance of an acute abdomen.  Objective: Vital signs in last 24 hours: Temp:  [98.1 F (36.7 C)-100.7 F (38.2 C)] 100.1 F (37.8 C) (02/16 0538) Pulse Rate:  [111-124] 124 (02/16 0538) Resp:  [18-33] 22 (02/16 0538) BP: (174-190)/(65-78) 190/65 (02/16 0538) SpO2:  [95 %-100 %] 99 % (02/16 0538) Last BM Date: 11/15/16  Intake/Output from previous day: 02/15 0701 - 02/16 0700 In: 3297.3 [P.O.:450; I.V.:2497.3; IV Piggyback:350] Out: 300 [Urine:300] Intake/Output this shift: No intake/output data recorded.  General appearance: alert, severe distress and uncontrolled abdominal pain Resp: clear to auscultation bilaterally and anterior exam GI: minimal distension, acute pain all over the abdomen.  Abdominal wound is intact no sign of wound infection.  Pain is way beyond anything we can explain with exam or CT scan. Extremities: mild edema  Lab Results:   Recent Labs  11/23/16 0026 11/24/16 0557  WBC 20.1* 24.4*  HGB 7.6* 6.7*  HCT 22.9* 20.3*  PLT 405* 408*    BMET  Recent Labs  11/23/16 0500 11/24/16 0557  NA 136 135  K 4.1 3.9  CL 104 103  CO2 25 25  GLUCOSE 133* 131*  BUN 23* 20  CREATININE 0.53 0.59  CALCIUM 8.4* 8.0*   PT/INR No results for input(s): LABPROT, INR in the last 72 hours.   Recent Labs Lab 11/20/16 0441 11/23/16 0500  AST 23 49*  ALT 22 42  ALKPHOS 179* 253*  BILITOT 0.7 1.2  PROT 6.0* 6.4*  ALBUMIN 1.8* 2.1*     Lipase     Component Value Date/Time   LIPASE 13 11/11/2016 0945     Studies/Results: Ct Abdomen Pelvis W Contrast  Result Date: 11/23/2016 CLINICAL DATA:  Persistent generalized abdominal pain. EXAM: CT ABDOMEN AND PELVIS WITH CONTRAST TECHNIQUE: Multidetector CT imaging of the abdomen and pelvis was performed using the standard  protocol following bolus administration of intravenous contrast. CONTRAST:  131mL ISOVUE-300 IOPAMIDOL (ISOVUE-300) INJECTION 61% COMPARISON:  CT scan of November 10, 2016. FINDINGS: Lower chest: No acute abnormality. Hepatobiliary: No focal liver abnormality is seen. Status post cholecystectomy. No biliary dilatation. Pancreas: Unremarkable. No pancreatic ductal dilatation or surrounding inflammatory changes. Spleen: Normal in size without focal abnormality. Adrenals/Urinary Tract: Adrenal glands are unremarkable. No hydronephrosis or renal obstruction is noted. Foley catheter is noted in the urinary bladder. Stomach/Bowel: Mild small bowel dilatation is noted consistent with ileus or possibly small bowel obstruction. Vascular/Lymphatic: Aortic atherosclerosis. No enlarged abdominal or pelvic lymph nodes. Reproductive: Uterus and bilateral adnexa are unremarkable. Other: Mild anasarca is noted. No abnormal fluid collection is noted. Musculoskeletal: Status post surgical posterior fusion extending from L2-L5. IMPRESSION: Aortic atherosclerosis. Mild anasarca. Stable mild small bowel dilatation is noted consistent with ileus or possible small bowel obstruction. Electronically Signed   By: Marijo Conception, M.D.   On: 11/23/2016 17:13   Prior to Admission medications   Medication Sig Start Date End Date Taking? Authorizing Provider  amLODipine (NORVASC) 10 MG tablet Take 0.5 mg by mouth daily.   Yes Historical Provider, MD  amoxicillin (AMOXIL) 875 MG tablet Take 875 mg by mouth daily. 10/02/16  Yes Historical Provider, MD  aspirin EC 81 MG tablet Take 81 mg by mouth daily.   Yes Historical Provider, MD  cloNIDine (CATAPRES) 0.1 MG tablet Take 0.1 mg by mouth  daily.    Yes Historical Provider, MD  hydrochlorothiazide (HYDRODIURIL) 25 MG tablet Take 25 mg by mouth daily.   Yes Historical Provider, MD  Multiple Vitamin (MULTIVITAMIN) tablet Take 1 tablet by mouth daily.   Yes Historical Provider, MD  nebivolol  (BYSTOLIC) 5 MG tablet Take 5 mg by mouth daily.   Yes Historical Provider, MD  omeprazole (PRILOSEC) 40 MG capsule Take 40 mg by mouth daily.   Yes Historical Provider, MD  potassium chloride SA (K-DUR,KLOR-CON) 20 MEQ tablet Take 20 mEq by mouth 2 (two) times daily.   Yes Historical Provider, MD  FLUZONE HIGH-DOSE 0.5 ML SUSY TO BE ADMINISTERED BY PHARMACIST FOR IMMUNIZATION 07/25/16   Historical Provider, MD    Medications: . sodium chloride   Intravenous Once  . enoxaparin (LOVENOX) injection  40 mg Subcutaneous Q24H  . insulin aspart  0-9 Units Subcutaneous Q8H  . metoprolol  5 mg Intravenous Q6H  . nitroGLYCERIN  1 inch Topical Q6H  . pantoprazole (PROTONIX) IV  80 mg Intravenous Q12H  . piperacillin-tazobactam (ZOSYN)  IV  3.375 g Intravenous Q8H   . lactated ringers 75 mL/hr at 11/23/16 0436  . Marland KitchenTPN (CLINIMIX-E) Adult 75 mL/hr at 11/23/16 1727    Assessment/Plan Admitted 1/20 - Medicine S/p Laparoscopic Cholecystectomy with intraoperative cholangiogram, 10/30/16, Dr. Rodman Key Martin;Necrotic gallbladder with free intrapertioneal bile; enlarged CBD with free flow into the duodenum POD 25 CT 1/25 - ? Ileus, no abscess/ HIDA 11/04/16 - no leak/ CT 11/10/16: fluid in GB fossa, no abscess - ileus/ NG placed 1/30 Exploratory laparotomy with LOA - findings: Mechanical obstruction, 11/17/16, Dr. Dalbert Batman POD 7 Chest pain with elevated troponin - Echo EF 0000000 1 diastolic dysfunction/Trivial MR Anemia  H/H down to 6.7/20 - blood ordered Malnutrition - prealbumin 8.3  Hypertension COPD GERD-tonics History infected knee after TKA. History MRSA FEN: IV fluids/NPO ID: Zosyn 1/21- 11/16/16 Cefotetan 1 preop dose 11/17/16 DVT:  Lovenox   Plan:  She has been seen and examined by Dr. Zella Richer.  He plans to take her back to the OR for exploratory laparotomy this AM.      LOS: 27 days    Melanie [name] 11/24/2016 [phone]

## 2016-11-25 LAB — COMPREHENSIVE METABOLIC PANEL
ALT: 186 U/L — ABNORMAL HIGH (ref 14–54)
ANION GAP: 5 (ref 5–15)
AST: 121 U/L — ABNORMAL HIGH (ref 15–41)
Albumin: 1.6 g/dL — ABNORMAL LOW (ref 3.5–5.0)
Alkaline Phosphatase: 236 U/L — ABNORMAL HIGH (ref 38–126)
BUN: 19 mg/dL (ref 6–20)
CALCIUM: 7.9 mg/dL — AB (ref 8.9–10.3)
CHLORIDE: 103 mmol/L (ref 101–111)
CO2: 28 mmol/L (ref 22–32)
Creatinine, Ser: 0.47 mg/dL (ref 0.44–1.00)
Glucose, Bld: 123 mg/dL — ABNORMAL HIGH (ref 65–99)
Potassium: 3.5 mmol/L (ref 3.5–5.1)
SODIUM: 136 mmol/L (ref 135–145)
Total Bilirubin: 1.4 mg/dL — ABNORMAL HIGH (ref 0.3–1.2)
Total Protein: 5.4 g/dL — ABNORMAL LOW (ref 6.5–8.1)

## 2016-11-25 LAB — GLUCOSE, CAPILLARY
GLUCOSE-CAPILLARY: 120 mg/dL — AB (ref 65–99)
GLUCOSE-CAPILLARY: 121 mg/dL — AB (ref 65–99)
Glucose-Capillary: 132 mg/dL — ABNORMAL HIGH (ref 65–99)
Glucose-Capillary: 131 mg/dL — ABNORMAL HIGH (ref 65–99)
Glucose-Capillary: 123 mg/dL — ABNORMAL HIGH (ref 65–99)
Glucose-Capillary: 134 mg/dL — ABNORMAL HIGH (ref 65–99)

## 2016-11-25 LAB — CBC
HEMATOCRIT: 27.1 % — AB (ref 36.0–46.0)
HEMOGLOBIN: 9.1 g/dL — AB (ref 12.0–15.0)
MCH: 26.8 pg (ref 26.0–34.0)
MCHC: 33.6 g/dL (ref 30.0–36.0)
MCV: 79.9 fL (ref 78.0–100.0)
Platelets: 370 10*3/uL (ref 150–400)
RBC: 3.39 MIL/uL — ABNORMAL LOW (ref 3.87–5.11)
RDW: 16.8 % — ABNORMAL HIGH (ref 11.5–15.5)
WBC: 23 10*3/uL — AB (ref 4.0–10.5)

## 2016-11-25 LAB — PHOSPHORUS: PHOSPHORUS: 3.2 mg/dL (ref 2.5–4.6)

## 2016-11-25 LAB — MAGNESIUM: Magnesium: 1.5 mg/dL — ABNORMAL LOW (ref 1.7–2.4)

## 2016-11-25 LAB — PROCALCITONIN: PROCALCITONIN: 0.65 ng/mL

## 2016-11-25 MED ORDER — SODIUM CHLORIDE 0.9% FLUSH: 9.0000 mL | INTRAVENOUS | Status: DC | PRN

## 2016-11-25 MED ORDER — HYDROMORPHONE 1 MG/ML IV SOLN
INTRAVENOUS | Status: DC
  Administered 2016-11-25: 2.2 mg via INTRAVENOUS
  Administered 2016-11-25: 0.4 mg via INTRAVENOUS
  Administered 2016-11-25: 1 mg via INTRAVENOUS
  Administered 2016-11-25: 10:00:00 via INTRAVENOUS
  Administered 2016-11-26: 2.6 mg via INTRAVENOUS
  Administered 2016-11-26: 1.8 mg via INTRAVENOUS
  Administered 2016-11-26: 2 mg via INTRAVENOUS
  Administered 2016-11-26: 1.6 mg via INTRAVENOUS
  Administered 2016-11-26: 1.2 mg via INTRAVENOUS
  Administered 2016-11-26: 1.6 mg via INTRAVENOUS
  Administered 2016-11-27: 1 mg via INTRAVENOUS
  Filled 2016-11-25: qty 25

## 2016-11-25 MED ORDER — TRACE MINERALS CR-CU-MN-SE-ZN 10-1000-500-60 MCG/ML IV SOLN
INTRAVENOUS | Status: AC
  Administered 2016-11-25: 18:00:00 via INTRAVENOUS
  Filled 2016-11-25 (×2): qty 1800

## 2016-11-25 MED ORDER — DIPHENHYDRAMINE HCL 50 MG/ML IJ SOLN: 12.5000 mg | Freq: Four times a day (QID) | INTRAMUSCULAR | Status: DC | PRN

## 2016-11-25 MED ORDER — HYDROMORPHONE HCL 1 MG/ML IJ SOLN
0.5000 mg | INTRAMUSCULAR | Status: DC | PRN
Start: 2016-11-25 — End: 2016-11-27
  Administered 2016-11-25: 1 mg via INTRAVENOUS
  Filled 2016-11-25: qty 1

## 2016-11-25 MED ORDER — ONDANSETRON HCL 4 MG/2ML IJ SOLN: 4.0000 mg | Freq: Four times a day (QID) | INTRAMUSCULAR | Status: DC | PRN

## 2016-11-25 MED ORDER — NITROGLYCERIN 0.4 MG SL SUBL
SUBLINGUAL_TABLET | SUBLINGUAL | Status: AC
  Administered 2016-11-25: 0.4 mg
  Filled 2016-11-25: qty 1

## 2016-11-25 MED ORDER — CLONIDINE HCL 0.1 MG/24HR TD PTWK
0.1000 mg | MEDICATED_PATCH | TRANSDERMAL | Status: DC
  Administered 2016-11-25: 0.1 mg via TRANSDERMAL
  Filled 2016-11-25: qty 1

## 2016-11-25 MED ORDER — DIPHENHYDRAMINE HCL 12.5 MG/5ML PO ELIX: 12.5000 mg | ORAL_SOLUTION | Freq: Four times a day (QID) | ORAL | Status: DC | PRN

## 2016-11-25 MED ORDER — MAGNESIUM SULFATE 2 GM/50ML IV SOLN
2.0000 g | Freq: Once | INTRAVENOUS | Status: AC
  Administered 2016-11-25: 2 g via INTRAVENOUS
  Filled 2016-11-25: qty 50

## 2016-11-25 MED ORDER — NALOXONE HCL 0.4 MG/ML IJ SOLN: 0.4000 mg | INTRAMUSCULAR | Status: DC | PRN

## 2016-11-25 MED ORDER — FAT EMULSION 20 % IV EMUL
250.0000 mL | INTRAVENOUS | Status: AC
  Administered 2016-11-25: 250 mL via INTRAVENOUS
  Filled 2016-11-25: qty 250

## 2016-11-25 MED ORDER — SODIUM CHLORIDE 0.9 % IV SOLN
30.0000 meq | Freq: Once | INTRAVENOUS | Status: AC
  Administered 2016-11-25: 30 meq via INTRAVENOUS
  Filled 2016-11-25: qty 15

## 2016-11-25 NOTE — Progress Notes (Signed)
1 Day Post-Op  Subjective: Pt went back to OR for ex lap, LOA, reduction of small bowel volvulus.  Denies nausea this AM. Has continued to have chest pain on and off.  Received blood yesterday.  No flatus.  Had some significant HTN last night, got hydralazine.    Objective: Vital signs in last 24 hours: Temp:  [97.8 F (36.6 C)-99.8 F (37.7 C)] 99.1 F (37.3 C) (02/17 0800) Pulse Rate:  [86-109] 93 (02/17 0800) Resp:  [14-24] 15 (02/17 0800) BP: (143-214)/(43-95) 162/48 (02/17 0800) SpO2:  [94 %-100 %] 98 % (02/17 0800) Last BM Date: 11/15/16  Intake/Output from previous day: 02/16 0701 - 02/17 0700 In: 5322.3 [I.V.:4067.3; Blood:700; IV Piggyback:305] Out: 1630 [Urine:1250; Emesis/NG output:280; Blood:100] Intake/Output this shift: Total I/O In: 150 [I.V.:150] Out: 60 [Urine:60]  General appearance: alert, looks uncomfortable. Resp: breathing comfortably.   GI: moderate distension, dressing c/d/i.   Extremities: mild edema  Lab Results:   Recent Labs  11/23/16 0026 11/24/16 0557  WBC 20.1* 24.4*  HGB 7.6* 6.7*  HCT 22.9* 20.3*  PLT 405* 408*    BMET  Recent Labs  11/24/16 0557 11/25/16 0518  NA 135 136  K 3.9 3.5  CL 103 103  CO2 25 28  GLUCOSE 131* 123*  BUN 20 19  CREATININE 0.59 0.47  CALCIUM 8.0* 7.9*   PT/INR No results for input(s): LABPROT, INR in the last 72 hours.   Recent Labs Lab 11/20/16 0441 11/23/16 0500 11/25/16 0518  AST 23 49* 121*  ALT 22 42 186*  ALKPHOS 179* 253* 236*  BILITOT 0.7 1.2 1.4*  PROT 6.0* 6.4* 5.4*  ALBUMIN 1.8* 2.1* 1.6*     Lipase     Component Value Date/Time   LIPASE 13 11/11/2016 0945     Studies/Results: Ct Abdomen Pelvis W Contrast  Result Date: 11/23/2016 CLINICAL DATA:  Persistent generalized abdominal pain. EXAM: CT ABDOMEN AND PELVIS WITH CONTRAST TECHNIQUE: Multidetector CT imaging of the abdomen and pelvis was performed using the standard protocol following bolus administration of  intravenous contrast. CONTRAST:  17mL ISOVUE-300 IOPAMIDOL (ISOVUE-300) INJECTION 61% COMPARISON:  CT scan of November 10, 2016. FINDINGS: Lower chest: No acute abnormality. Hepatobiliary: No focal liver abnormality is seen. Status post cholecystectomy. No biliary dilatation. Pancreas: Unremarkable. No pancreatic ductal dilatation or surrounding inflammatory changes. Spleen: Normal in size without focal abnormality. Adrenals/Urinary Tract: Adrenal glands are unremarkable. No hydronephrosis or renal obstruction is noted. Foley catheter is noted in the urinary bladder. Stomach/Bowel: Mild small bowel dilatation is noted consistent with ileus or possibly small bowel obstruction. Vascular/Lymphatic: Aortic atherosclerosis. No enlarged abdominal or pelvic lymph nodes. Reproductive: Uterus and bilateral adnexa are unremarkable. Other: Mild anasarca is noted. No abnormal fluid collection is noted. Musculoskeletal: Status post surgical posterior fusion extending from L2-L5. IMPRESSION: Aortic atherosclerosis. Mild anasarca. Stable mild small bowel dilatation is noted consistent with ileus or possible small bowel obstruction. Electronically Signed   By: Marijo Conception, M.D.   On: 11/23/2016 17:13   Dg Abd Portable 1v  Result Date: 11/24/2016 CLINICAL DATA:  Location of NG tube. Nausea. Status post cholecystectomy on 10/30/2016 with exploratory laparotomy for removal of adhesions on 11/17/2016 and redo exploratory laparatomy today. EXAM: PORTABLE ABDOMEN - 1 VIEW COMPARISON:  11/16/2016 FINDINGS: The tip and side port of a gastric tube is seen below the left hemidiaphragm in the expected location of the body of the stomach with tip crossing midline reports the right lower quadrant. Mild scattered gas containing small  and large bowel loops are noted. Postsurgical clips are seen overlying the pelvis. Patient status post multilevel lumbar spinal fusion. IMPRESSION: The tip and side hole of a gastric tube are in the expected  location of the gastric antrum and body. These results will be called to the ordering clinician or representative by the Radiologist Assistant, and communication documented in the PACS or zVision Dashboard. Electronically Signed   By: Ashley Royalty M.D.   On: 11/24/2016 19:55   Prior to Admission medications   Medication Sig Start Date End Date Taking? Authorizing Provider  amLODipine (NORVASC) 10 MG tablet Take 0.5 mg by mouth daily.   Yes Historical Provider, MD  amoxicillin (AMOXIL) 875 MG tablet Take 875 mg by mouth daily. 10/02/16  Yes Historical Provider, MD  aspirin EC 81 MG tablet Take 81 mg by mouth daily.   Yes Historical Provider, MD  cloNIDine (CATAPRES) 0.1 MG tablet Take 0.1 mg by mouth daily.    Yes Historical Provider, MD  hydrochlorothiazide (HYDRODIURIL) 25 MG tablet Take 25 mg by mouth daily.   Yes Historical Provider, MD  Multiple Vitamin (MULTIVITAMIN) tablet Take 1 tablet by mouth daily.   Yes Historical Provider, MD  nebivolol (BYSTOLIC) 5 MG tablet Take 5 mg by mouth daily.   Yes Historical Provider, MD  omeprazole (PRILOSEC) 40 MG capsule Take 40 mg by mouth daily.   Yes Historical Provider, MD  potassium chloride SA (K-DUR,KLOR-CON) 20 MEQ tablet Take 20 mEq by mouth 2 (two) times daily.   Yes Historical Provider, MD  FLUZONE HIGH-DOSE 0.5 ML SUSY TO BE ADMINISTERED BY PHARMACIST FOR IMMUNIZATION 07/25/16   Historical Provider, MD    Medications: . enoxaparin (LOVENOX) injection  40 mg Subcutaneous Q24H  . HYDROmorphone   Intravenous Q4H  . insulin aspart  0-9 Units Subcutaneous Q8H  . magnesium sulfate 1 - 4 g bolus IVPB  2 g Intravenous Once  . metoprolol  5 mg Intravenous Q6H  . nitroGLYCERIN  1 inch Topical Q6H  . pantoprazole (PROTONIX) IV  80 mg Intravenous Q12H  . piperacillin-tazobactam (ZOSYN)  IV  3.375 g Intravenous Q8H  . potassium chloride (KCL MULTIRUN) 30 mEq in 265 mL IVPB  30 mEq Intravenous Once   . Marland KitchenTPN (CLINIMIX-E) Adult     And  . fat emulsion     . lactated ringers 75 mL/hr at 11/24/16 1800  . Marland KitchenTPN (CLINIMIX-E) Adult 75 mL/hr at 11/24/16 1800    Assessment/Plan Admitted 1/20 - Medicine S/p Laparoscopic Cholecystectomy with intraoperative cholangiogram, 10/30/16, Dr. Rodman Key Martin;Necrotic gallbladder with free intrapertioneal bile; enlarged CBD with free flow into the duodenum POD 26 CT 1/25 - ? Ileus, no abscess/ HIDA 11/04/16 - no leak/ CT 11/10/16: fluid in GB fossa, no abscess - ileus/ NG placed 1/30 Exploratory laparotomy with LOA - findings: Mechanical obstruction, 11/17/16, Dr. Dalbert Batman POD 8 Exploratory laparotomy, LOA, reduction of SB volvulus 2/16- Dr. Zella Richer, POD 1   Chest pain with elevated troponin - Echo EF 0000000 1 diastolic dysfunction/Trivial MR- continue prn nitro Anemia  H/H down to 6.7/20 - received transfusion yesterday, recheck CBC today.  May need more blood if still low given continued chest pain.   Severe protein calorie malnutrition - prealbumin 8.3 - on TNA HTN - lopressor, prn hydralazine.  Think PCA will help with high blood pressures. COPD- IS GERD-protonix History infected knee after TKA. History MRSA FEN: IV fluids/NPO ID: Zosyn 1/21- 11/16/16, on again 2/16-present.  Probably does not need prolonged course.   DVT  prophylaxis:  Lovenox   NPO, NGT, TNA.     LOS: 28 days    Melanie [name] 11/25/2016

## 2016-11-25 NOTE — Progress Notes (Signed)
PROGRESS NOTE    Melanie [name]  [id number] DOB: May 23, 1936 DOA: 10/28/2016 PCP: Osborne Casco, MD    Brief Narrative: 81 yo female with prolonged hospitalization, admitted on 01/20 for necrotic cholecystitis. Developed post op bowel obstruction, re-intervened on 02/09 with exploratory laparotomy. NG tube in place, on TPN. Consulted for chest pain that ruled out for ACS. Patient has been in persistent pain, on PCA pump. Patient had third surgical intervention, emergent laparatomy, recurrent bowel obstruction with volvulus with ischemic segment.   Assessment & Plan:   Principal Problem:   Small bowel obstruction s/p ex lap & lysis of adhesions 11/17/2016 Active Problems:   Necrotic cholecystitis s/p lap cholecystectomy 10/30/2016   Essential hypertension   Hyperlipidemia   COPD (chronic obstructive pulmonary disease) (HCC)   GERD (gastroesophageal reflux disease)   Chest pain   H/O exploratory laparotomy    1. Necrotic cholecystitis. Patient continue NPO. NG tube in place.    3. Post operative bowel obstruction complicated with volvulus and ischemic bowel and sepsis features. Sp exploratory laparotomy, continue IV fluids, IV antibiotics. Pain control per primary team. WBC down to 23 from 24. Continue antibiotic therapy with Zosyn.   4. HTN uncontrolled. Continue as needed hydralazine for now, will continue pain control. Patient at home on clonidine 0.1 mg daily. Blood pressure systolic 0000000 to [id number]. Will resume clonidine per patch. Continue to hold on amlodipine, hctz, bystolic.    5. COPD. Keep 02 sat above 92%, oxymetry monitoring and aspiration precautions. Supplemental 02 per [place]. No signs of volume overload, no wheezing.   6. Iron deficiency anemia. Tolerated well blood transfusion 2 units PRBC. Hb 9.1 with Hct 27. Po iron when able to tolerate po.   DVT prophylaxis:enoxaparin  Code Status:full Family Communication:No family at the bedside.  Disposition  Plan:Home/ SNF   Consultants:    Procedures:                           02/16 Exploratory laparotomy with volvulus and ischemic bowel.   02/09 Exploratory laparotomy, lysis of adhesions  01/22 Laparoscopic Cholecystectomy with intraoperative cholangiogram.    Antimicrobials:   Zosyn   Subjective: Pain improved but still persistent, moderate to severe. No nausea or vomiting. Positive chest pain. No fever or chills, patient still NPO.   Objective: Vitals:   11/25/16 0400 11/25/16 0500 11/25/16 0600 11/25/16 0800  BP: (!) 143/43 (!) 149/66 (!) 161/70 (!) 162/48  Pulse: 97 95 88 93  Resp: 14 14 (!) 24 15  Temp: 98.3 F (36.8 C)     TempSrc: Oral     SpO2: 95% 96% 96% 98%  Weight:      Height:        Intake/Output Summary (Last 24 hours) at 11/25/16 0810 Last data filed at 11/25/16 0600  Gross per 24 hour  Intake          5247.25 ml  Output             1630 ml  Net          3617.25 ml   Filed Weights   10/28/16 0551 11/08/16 1347 11/20/16 0946  Weight: 70.3 kg (155 lb) 74.3 kg (163 lb 11.2 oz) 76.4 kg (168 lb 6.9 oz)    Examination:  General exam: deconditioned  E ENT: mild pallor, no icterus, oral mucosa moist Respiratory system: Clear to auscultation. Respiratory effort normal. No wheezing, rales or rhonchi.  Cardiovascular system: S1 & S2  heard, RRR. No JVD, murmurs, rubs, gallops or clicks. No pedal edema. Gastrointestinal system: Abdomen is nondistended, tender to superficial palpation. No organomegaly or masses felt. Normal bowel sounds heard. Central nervous system: Alert and oriented. No focal neurological deficits. Extremities: Symmetric 5 x 5 power. Skin: No rashes, lesions or ulcers  Data Reviewed: I have personally reviewed following labs and imaging studies  CBC:  Recent Labs Lab 11/20/16 0441 11/21/16 0430 11/22/16 0646 11/23/16 0026 11/24/16 0557  WBC 17.3* 19.2* 17.8* 20.1* 24.4*  NEUTROABS 14.1*  --   --   --   --   HGB 7.2*  7.5* 7.4* 7.6* 6.7*  HCT 22.2* 22.8* 22.9* 22.9* 20.3*  MCV 80.1 79.2 79.5 79.5 79.0  PLT 315 352 370 405* [id number]*   Basic Metabolic Panel:  Recent Labs Lab 11/19/16 0426 11/20/16 0441 11/21/16 0430 11/23/16 0500 11/24/16 0557 11/25/16 0518  NA 136 135 133* 136 135 136  K 3.8 3.7 4.0 4.1 3.9 3.5  CL 104 101 102 104 103 103  CO2 27 28 25 25 25 28   GLUCOSE 114* 121* 132* 133* 131* 123*  BUN 18 17 19  23* 20 19  CREATININE 0.59 0.49 0.44 0.53 0.59 0.47  CALCIUM 7.9* 8.0* 8.0* 8.4* 8.0* 7.9*  MG 1.5* 1.5* 2.0 1.7  --  1.5*  PHOS  --  3.4 3.6 4.0  --  3.2   GFR: Estimated Creatinine Clearance: 55.4 mL/min (by C-G formula based on SCr of 0.47 mg/dL). Liver Function Tests:  Recent Labs Lab 11/20/16 0441 11/23/16 0500 11/25/16 0518  AST 23 49* 121*  ALT 22 42 186*  ALKPHOS 179* 253* 236*  BILITOT 0.7 1.2 1.4*  PROT 6.0* 6.4* 5.4*  ALBUMIN 1.8* 2.1* 1.6*   No results for input(s): LIPASE, AMYLASE in the last 168 hours. No results for input(s): AMMONIA in the last 168 hours. Coagulation Profile: No results for input(s): INR, PROTIME in the last 168 hours. Cardiac Enzymes:  Recent Labs Lab 11/20/16 0833 11/20/16 1743 11/20/16 2047 11/21/16 1406  TROPONINI 0.04* 0.05* 0.05* 0.05*   BNP (last 3 results) No results for input(s): PROBNP in the last 8760 hours. HbA1C: No results for input(s): HGBA1C in the last 72 hours. CBG:  Recent Labs Lab 11/24/16 0703 11/24/16 1341 11/24/16 2122 11/25/16 0516 11/25/16 0731  GLUCAP 143* 176* 132* 131* 123*   Lipid Profile: No results for input(s): CHOL, HDL, LDLCALC, TRIG, CHOLHDL, LDLDIRECT in the last 72 hours. Thyroid Function Tests: No results for input(s): TSH, T4TOTAL, FREET4, T3FREE, THYROIDAB in the last 72 hours. Anemia Panel: No results for input(s): VITAMINB12, FOLATE, FERRITIN, TIBC, IRON, RETICCTPCT in the last 72 hours. Sepsis Labs:  Recent Labs Lab 11/24/16 1522 11/25/16 0518  PROCALCITON 0.56 0.65     Recent Results (from the past 240 hour(s))  Urine culture     Status: None   Collection Time: 11/20/16  8:51 AM  Result Value Ref Range Status   Specimen Description URINE, CATHETERIZED  Final   Special Requests NONE  Final   Culture   Final    NO GROWTH Performed at [facility], [address], [place], [place] [address]    Report Status 11/21/2016 FINAL  Final  MRSA PCR Screening     Status: None   Collection Time: 11/20/16  3:50 PM  Result Value Ref Range Status   MRSA by PCR NEGATIVE NEGATIVE Final    Comment:        The GeneXpert MRSA Assay (FDA  approved for NASAL specimens only), is one component of a comprehensive MRSA colonization surveillance program. It is not intended to diagnose MRSA infection nor to guide or monitor treatment for MRSA infections.          Radiology Studies: Ct Abdomen Pelvis W Contrast  Result Date: 11/23/2016 CLINICAL DATA:  Persistent generalized abdominal pain. EXAM: CT ABDOMEN AND PELVIS WITH CONTRAST TECHNIQUE: Multidetector CT imaging of the abdomen and pelvis was performed using the standard protocol following bolus administration of intravenous contrast. CONTRAST:  168mL ISOVUE-300 IOPAMIDOL (ISOVUE-300) INJECTION 61% COMPARISON:  CT scan of November 10, 2016. FINDINGS: Lower chest: No acute abnormality. Hepatobiliary: No focal liver abnormality is seen. Status post cholecystectomy. No biliary dilatation. Pancreas: Unremarkable. No pancreatic ductal dilatation or surrounding inflammatory changes. Spleen: Normal in size without focal abnormality. Adrenals/Urinary Tract: Adrenal glands are unremarkable. No hydronephrosis or renal obstruction is noted. Foley catheter is noted in the urinary bladder. Stomach/Bowel: Mild small bowel dilatation is noted consistent with ileus or possibly small bowel obstruction. Vascular/Lymphatic: Aortic atherosclerosis. No enlarged abdominal or pelvic lymph nodes. Reproductive: Uterus and bilateral  adnexa are unremarkable. Other: Mild anasarca is noted. No abnormal fluid collection is noted. Musculoskeletal: Status post surgical posterior fusion extending from L2-L5. IMPRESSION: Aortic atherosclerosis. Mild anasarca. Stable mild small bowel dilatation is noted consistent with ileus or possible small bowel obstruction. Electronically Signed   By: Marijo Conception, M.D.   On: 11/23/2016 17:13   Dg Abd Portable 1v  Result Date: 11/24/2016 CLINICAL DATA:  Location of NG tube. Nausea. Status post cholecystectomy on 10/30/2016 with exploratory laparotomy for removal of adhesions on 11/17/2016 and redo exploratory laparatomy today. EXAM: PORTABLE ABDOMEN - 1 VIEW COMPARISON:  11/16/2016 FINDINGS: The tip and side port of a gastric tube is seen below the left hemidiaphragm in the expected location of the body of the stomach with tip crossing midline reports the right lower quadrant. Mild scattered gas containing small and large bowel loops are noted. Postsurgical clips are seen overlying the pelvis. Patient status post multilevel lumbar spinal fusion. IMPRESSION: The tip and side hole of a gastric tube are in the expected location of the gastric antrum and body. These results will be called to the ordering clinician or representative by the Radiologist Assistant, and communication documented in the PACS or zVision Dashboard. Electronically Signed   By: Ashley Royalty M.D.   On: 11/24/2016 19:55        Scheduled Meds: . enoxaparin (LOVENOX) injection  40 mg Subcutaneous Q24H  . insulin aspart  0-9 Units Subcutaneous Q8H  . metoprolol  5 mg Intravenous Q6H  . nitroGLYCERIN  1 inch Topical Q6H  . pantoprazole (PROTONIX) IV  80 mg Intravenous Q12H  . piperacillin-tazobactam (ZOSYN)  IV  3.375 g Intravenous Q8H   Continuous Infusions: . lactated ringers 75 mL/hr at 11/24/16 1800  . Marland KitchenTPN (CLINIMIX-E) Adult 75 mL/hr at 11/24/16 1800     LOS: 28 days        Jaanai Salemi Gerome Apley, MD [facility] Pager [phone]  If 7PM-7AM, please contact night-coverage [email or web address] Password [email or web address] 11/25/2016, 8:10 AM

## 2016-11-25 NOTE — Progress Notes (Signed)
PHARMACY - ADULT TOTAL PARENTERAL NUTRITION CONSULT NOTE   Pharmacy Consult for TPN Indication: prolonged ileus  Patient Measurements: Height: 5\' 2"  (157.5 cm) Weight: 168 lb 6.9 oz (76.4 kg) IBW/kg (Calculated) : 50.1 TPN AdjBW (KG): 56.7 Body mass index is 30.81 kg/m.  Insulin Requirements: 4 units in the last 24hrs.  Current Nutrition: NPO (small aliquots of clear liquids)  IVF: none  Central access: PICC TPN start date: 11/08/16  ASSESSMENT                                                                                                          HPI: Patient is [name] with hx of gallstones presented to the ED on 1/20 with c/o abdominal pain. MRCP on 1/22 showed distended gallbladder with tiny gallstones and wall thickening with suspicion for acute cholecystitis. Patient underwent lap cholecystectomy with intraoperative cholangiogram on 1/22.  To start TPN for prolonged ileus and poor oral intake.  Significant events:  1/22: lap chole with cholangiogram 1/30: abd KUB with suspicion for post-op ileus; NG tube placed  2/2: high NG output from 1/31-2/1 (1359ml) but decreased 2/1-2/2 (300 ml). RN reported foul smell from NG tube --CCS may d/c NG 2/3: trial clamping NG - successful 2/4 NGT removed. Switching from E 5/20 to E 5/15 to better align with nutritional goals 2/6 leave NGT out, continue NPO, GI consult 2/8 Xray consistent with ileus or SBO - possible ex lap tomorrow 2/9: Underwent exploratory laparotomy, lysis of adhesions.  2/12 Transferred to ICU for chest pain, elevated troponin. 2/16: back to OR for recurrent SBO.  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  Today 11/25/16:   Glucose controlled (goal <150). No hx of DM.  Electrolytes: mag low, K low end of normal  Renal: scr/BUN ok; CrCl 55 ml/min  LFTs: elevated; albumin low  TGs: wnl, 69 (2/12)  Prealbumin: 5.9 (2/5), 8.3 (2/12)  NUTRITIONAL GOALS                                                                                              RD recs (2/2) Kcal:  1750-1950 Protein:  80-90g Fluid:  1.8L/day  Clinimix E 5/15 (switching from E 5/20 at 70 ml/hr) at a goal rate of 75 ml/hr + 20% fat emulsion at 20 ml/hr x 12 hr to provide:  90 g/day protein, 1758 Kcal/day.  (+++There is currently a Psychologist, prison and probation services of Clinimix solution. Pharmacy will use Clinimix product based on availability+++)  PLAN   Mag sulfate 2gm IV x 1, KCl 30 meq IV x 1  At 1800 today:  Continue Clinimix E 5/15 at goal rate of 75 ml/hr.  20% fat emulsion at 20 ml/hr over 12 hrs  Trace elements are on national shortage, will be added as available. Will add some in today's bag  Multivitamins will be added daily.  Continue sensitive SSI q8h.   BMet with mag tomorrow  TPN lab panels on Mondays & Thursdays.   Dolly Rias RPh 11/25/2016, 8:24 AM Pager [phone]

## 2016-11-26 LAB — CBC
HEMATOCRIT: 27.6 % — AB (ref 36.0–46.0)
Hemoglobin: 9.2 g/dL — ABNORMAL LOW (ref 12.0–15.0)
MCH: 27.1 pg (ref 26.0–34.0)
MCHC: 33.3 g/dL (ref 30.0–36.0)
MCV: 81.4 fL (ref 78.0–100.0)
Platelets: 391 10*3/uL (ref 150–400)
RBC: 3.39 MIL/uL — AB (ref 3.87–5.11)
RDW: 17.5 % — ABNORMAL HIGH (ref 11.5–15.5)
WBC: 18.7 10*3/uL — AB (ref 4.0–10.5)

## 2016-11-26 LAB — GLUCOSE, CAPILLARY
GLUCOSE-CAPILLARY: 117 mg/dL — AB (ref 65–99)
GLUCOSE-CAPILLARY: 125 mg/dL — AB (ref 65–99)
GLUCOSE-CAPILLARY: 125 mg/dL — AB (ref 65–99)
Glucose-Capillary: 112 mg/dL — ABNORMAL HIGH (ref 65–99)
Glucose-Capillary: 127 mg/dL — ABNORMAL HIGH (ref 65–99)

## 2016-11-26 LAB — BASIC METABOLIC PANEL
Anion gap: 9 (ref 5–15)
BUN: 19 mg/dL (ref 6–20)
CHLORIDE: 103 mmol/L (ref 101–111)
CO2: 24 mmol/L (ref 22–32)
Calcium: 8.1 mg/dL — ABNORMAL LOW (ref 8.9–10.3)
Creatinine, Ser: 0.44 mg/dL (ref 0.44–1.00)
GFR calc non Af Amer: >60 mL/min (ref >60)
Glucose, Bld: 133 mg/dL — ABNORMAL HIGH (ref 65–99)
POTASSIUM: 3.9 mmol/L (ref 3.5–5.1)
SODIUM: 136 mmol/L (ref 135–145)

## 2016-11-26 LAB — PROCALCITONIN: Procalcitonin: 3.51 ng/mL

## 2016-11-26 LAB — MAGNESIUM: Magnesium: 1.8 mg/dL (ref 1.7–2.4)

## 2016-11-26 MED ORDER — MAGNESIUM SULFATE IN D5W 1-5 GM/100ML-% IV SOLN
1.0000 g | Freq: Once | INTRAVENOUS | Status: AC
  Administered 2016-11-26: 1 g via INTRAVENOUS
  Filled 2016-11-26: qty 100

## 2016-11-26 MED ORDER — LABETALOL HCL 5 MG/ML IV SOLN
10.0000 mg | Freq: Once | INTRAVENOUS | Status: AC
  Administered 2016-11-26: 10 mg via INTRAVENOUS
  Filled 2016-11-26: qty 4

## 2016-11-26 MED ORDER — FAT EMULSION 20 % IV EMUL
250.0000 mL | INTRAVENOUS | Status: AC
  Administered 2016-11-26: 250 mL via INTRAVENOUS
  Filled 2016-11-26: qty 250

## 2016-11-26 MED ORDER — M.V.I. ADULT IV INJ
INJECTION | INTRAVENOUS | Status: AC
  Administered 2016-11-26: 18:00:00 via INTRAVENOUS
  Filled 2016-11-26 (×2): qty 1800

## 2016-11-26 NOTE — Progress Notes (Addendum)
RN notified, Dr. Georges Mouse, regarding current BP following administration of 20mg  Hydralazine at 1900 and 10mg  Labetolol at 2055. RN informed MD of medications that are due at 0000. Will continue to monitor.

## 2016-11-26 NOTE — Progress Notes (Signed)
Pt BP improving. If BP is not continuing to improve in 15 minute recheck, RN will page on-call for further orders. Will continue to monitor.

## 2016-11-26 NOTE — Progress Notes (Signed)
PROGRESS NOTE    Melanie [name]  [id number] DOB: 24-Jul-1936 DOA: 10/28/2016 PCP: Osborne Casco, MD    Brief Narrative:  81 yo female with prolonged hospitalization, admitted on 01/20 for necrotic cholecystitis. Developed post op bowel obstruction, re-intervened on 02/09 with exploratory laparotomy. NG tube in place, on TPN. Consulted for chest pain that ruled out for ACS. Patient has been in persistent pain, on PCA pump. Patient had third surgical intervention, emergent laparatomy, recurrent bowel obstruction with volvulus with ischemic segment. Resumed clonidine for blood pressure control.    Assessment & Plan:   Principal Problem:   Small bowel obstruction s/p ex lap & lysis of adhesions 11/17/2016 Active Problems:   Necrotic cholecystitis s/p lap cholecystectomy 10/30/2016   Essential hypertension   Hyperlipidemia   COPD (chronic obstructive pulmonary disease) (HCC)   GERD (gastroesophageal reflux disease)   Chest pain   H/O exploratory laparotomy   1. Necrotic cholecystitis. Patient continue NPO. NG tube in place. Supportive post operative care per surgery.   2. Post operative bowel obstruction complicated with volvulus and ischemic bowel and sepsis features. Sp exploratory laparotomy, continue IV fluids, IV antibiotics with Zosyn. WBC down to 18.7. Pain control with pca pump.  4. HTN uncontrolled. Blood pressure 150 to 170, will target less than 180, tolerating well clonidine patch. Patient continue to be NPO. Continue hydralazine as needed.    5. COPD. Oxymetry monitoring and aspiration precautions. No signs of exacerbation.   6. Iron deficiency anemia. PO iron when tolerated, hb stable at 9,2 after blood transfusion.  DVT prophylaxis:enoxaparin  Code Status:full Family Communication:No family at the bedside.  Disposition Plan:Home/ SNF   Consultants:    Procedures: 02/16 Exploratory laparotomy with volvulus and  ischemic bowel.   02/09 Exploratory laparotomy, lysis of adhesions  01/22 Laparoscopic Cholecystectomy with intraoperative cholangiogram.    Antimicrobials:   Zosyn   Subjective: Patient feeling better, no nausea or vomiting, NG tube in place, positive abdominal pain, colic in nature, moderate to severe in intensity. Improved with IV analgesic.   Objective: Vitals:   11/26/16 0530 11/26/16 0600 11/26/16 0630 11/26/16 0750  BP: (!) 160/58 (!) 169/62 (!) 165/47   Pulse:  87 87   Resp: 15 14 14    Temp:    98.3 F (36.8 C)  TempSrc:    Oral  SpO2:  100% 100%   Weight:      Height:        Intake/Output Summary (Last 24 hours) at 11/26/16 0810 Last data filed at 11/26/16 0600  Gross per 24 hour  Intake         12125.33 ml  Output             1850 ml  Net         10275.33 ml   Filed Weights   10/28/16 0551 11/08/16 1347 11/20/16 0946  Weight: 70.3 kg (155 lb) 74.3 kg (163 lb 11.2 oz) 76.4 kg (168 lb 6.9 oz)    Examination:  General exam: deconditioned  E ENT: mild pallor, no icterus, oral mucosa moist.  Respiratory system: Mild decreased breath sounds at bases, no wheezing, rales or rhonchi.  Cardiovascular system: S1 & S2 heard, RRR. No JVD, murmurs, rubs, gallops or clicks. No pedal edema. Gastrointestinal system: Abdomen is nondistended, soft and nontender. No organomegaly or masses felt. Normal bowel sounds heard. Central nervous system: Alert and oriented. No focal neurological deficits. Extremities: Symmetric 5 x 5 power. Skin: No rashes, lesions or ulcers  Data  Reviewed: I have personally reviewed following labs and imaging studies  CBC:  Recent Labs Lab 11/20/16 0441  11/22/16 0646 11/23/16 0026 11/24/16 0557 11/25/16 0849 11/26/16 0444  WBC 17.3*  < > 17.8* 20.1* 24.4* 23.0* 18.7*  NEUTROABS 14.1*  --   --   --   --   --   --   HGB 7.2*  < > 7.4* 7.6* 6.7* 9.1* 9.2*  HCT 22.2*  < > 22.9* 22.9* 20.3* 27.1* 27.6*  MCV 80.1  < > 79.5 79.5 79.0  79.9 81.4  PLT 315  < > 370 405* 408* 370 391  < > = values in this interval not displayed. Basic Metabolic Panel:  Recent Labs Lab 11/20/16 0441 11/21/16 0430 11/23/16 0500 11/24/16 0557 11/25/16 0518 11/26/16 0444  NA 135 133* 136 135 136 136  K 3.7 4.0 4.1 3.9 3.5 3.9  CL 101 102 104 103 103 103  CO2 28 25 25 25 28 24   GLUCOSE 121* 132* 133* 131* 123* 133*  BUN 17 19 23* 20 19 19   CREATININE 0.49 0.44 0.53 0.59 0.47 0.44  CALCIUM 8.0* 8.0* 8.4* 8.0* 7.9* 8.1*  MG 1.5* 2.0 1.7  --  1.5* 1.8  PHOS 3.4 3.6 4.0  --  3.2  --    GFR: Estimated Creatinine Clearance: 55.4 mL/min (by C-G formula based on SCr of 0.44 mg/dL). Liver Function Tests:  Recent Labs Lab 11/20/16 0441 11/23/16 0500 11/25/16 0518  AST 23 49* 121*  ALT 22 42 186*  ALKPHOS 179* 253* 236*  BILITOT 0.7 1.2 1.4*  PROT 6.0* 6.4* 5.4*  ALBUMIN 1.8* 2.1* 1.6*   No results for input(s): LIPASE, AMYLASE in the last 168 hours. No results for input(s): AMMONIA in the last 168 hours. Coagulation Profile: No results for input(s): INR, PROTIME in the last 168 hours. Cardiac Enzymes:  Recent Labs Lab 11/20/16 0833 11/20/16 1743 11/20/16 2047 11/21/16 1406  TROPONINI 0.04* 0.05* 0.05* 0.05*   BNP (last 3 results) No results for input(s): PROBNP in the last 8760 hours. HbA1C: No results for input(s): HGBA1C in the last 72 hours. CBG:  Recent Labs Lab 11/25/16 1156 11/25/16 1646 11/25/16 2004 11/26/16 0004 11/26/16 0604  GLUCAP 134* 120* 121* 112* 127*   Lipid Profile: No results for input(s): CHOL, HDL, LDLCALC, TRIG, CHOLHDL, LDLDIRECT in the last 72 hours. Thyroid Function Tests: No results for input(s): TSH, T4TOTAL, FREET4, T3FREE, THYROIDAB in the last 72 hours. Anemia Panel: No results for input(s): VITAMINB12, FOLATE, FERRITIN, TIBC, IRON, RETICCTPCT in the last 72 hours. Sepsis Labs:  Recent Labs Lab 11/24/16 1522 11/25/16 0518 11/26/16 0444  PROCALCITON 0.56 0.65 3.51     Recent Results (from the past 240 hour(s))  Urine culture     Status: None   Collection Time: 11/20/16  8:51 AM  Result Value Ref Range Status   Specimen Description URINE, CATHETERIZED  Final   Special Requests NONE  Final   Culture   Final    NO GROWTH Performed at [facility], [place] [address], [place], [place] [address]    Report Status 11/21/2016 FINAL  Final  MRSA PCR Screening     Status: None   Collection Time: 11/20/16  3:50 PM  Result Value Ref Range Status   MRSA by PCR NEGATIVE NEGATIVE Final    Comment:        The GeneXpert MRSA Assay (FDA approved for NASAL specimens only), is one component of a comprehensive MRSA colonization surveillance  program. It is not intended to diagnose MRSA infection nor to guide or monitor treatment for MRSA infections.   Culture, blood (Routine X 2) w Reflex to ID Panel     Status: None (Preliminary result)   Collection Time: 11/24/16  1:45 PM  Result Value Ref Range Status   Specimen Description BLOOD LEFT HAND  Final   Special Requests BOTTLES DRAWN AEROBIC AND ANAEROBIC 5CC EACH  Final   Culture   Final    NO GROWTH < 24 HOURS Performed at [facility], [place] [address], [place], [place] [address]    Report Status PENDING  Incomplete  Culture, blood (Routine X 2) w Reflex to ID Panel     Status: None (Preliminary result)   Collection Time: 11/24/16  1:50 PM  Result Value Ref Range Status   Specimen Description BLOOD LEFT HAND  Final   Special Requests IN PEDIATRIC BOTTLE .[place]  Final   Culture   Final    NO GROWTH < 24 HOURS Performed at [facility], [place] [address] [place], [place] [address]    Report Status PENDING  Incomplete         Radiology Studies: Dg Abd Portable 1v  Result Date: 11/24/2016 CLINICAL DATA:  Location of NG tube. Nausea. Status post cholecystectomy on 10/30/2016 with exploratory laparotomy for removal of adhesions on 11/17/2016 and redo exploratory laparatomy today. EXAM:  PORTABLE ABDOMEN - 1 VIEW COMPARISON:  11/16/2016 FINDINGS: The tip and side port of a gastric tube is seen below the left hemidiaphragm in the expected location of the body of the stomach with tip crossing midline reports the right lower quadrant. Mild scattered gas containing small and large bowel loops are noted. Postsurgical clips are seen overlying the pelvis. Patient status post multilevel lumbar spinal fusion. IMPRESSION: The tip and side hole of a gastric tube are in the expected location of the gastric antrum and body. These results will be called to the ordering clinician or representative by the Radiologist Assistant, and communication documented in the PACS or zVision Dashboard. Electronically Signed   By: Ashley Royalty M.D.   On: 11/24/2016 19:55        Scheduled Meds: . cloNIDine  0.1 mg Transdermal Weekly  . enoxaparin (LOVENOX) injection  40 mg Subcutaneous Q24H  . HYDROmorphone   Intravenous Q4H  . insulin aspart  0-9 Units Subcutaneous Q8H  . magnesium sulfate 1 - 4 g bolus IVPB  1 g Intravenous Once  . metoprolol  5 mg Intravenous Q6H  . nitroGLYCERIN  1 inch Topical Q6H  . pantoprazole (PROTONIX) IV  80 mg Intravenous Q12H  . piperacillin-tazobactam (ZOSYN)  IV  3.375 g Intravenous Q8H   Continuous Infusions: . Marland KitchenTPN (CLINIMIX-E) Adult     And  . fat emulsion    . lactated ringers 75 mL/hr at 11/25/16 2120  . Marland KitchenTPN (CLINIMIX-E) Adult 75 mL/hr at 11/26/16 0600     LOS: 29 days        Mauricio Gerome Apley, MD [facility] Pager [phone]  If 7PM-7AM, please contact night-coverage [email or web address] Password [email or web address] 11/26/2016, 8:10 AM

## 2016-11-26 NOTE — Progress Notes (Signed)
2 Days Post-Op  Subjective: Had a good day yesterday, OOB for 4 hours.  Feeling worse this AM with pain, but has been sleeping.  NGT with continued bilious output.  No n/v.  No flatus.    No chest pain.  Objective: Vital signs in last 24 hours: Temp:  [98 F (36.7 C)-98.6 F (37 C)] 98.3 F (36.8 C) (02/18 0750) Pulse Rate:  [85-98] 87 (02/18 0630) Resp:  [12-30] 20 (02/18 0800) BP: (134-191)/(45-87) 165/47 (02/18 0630) SpO2:  [92 %-100 %] 99 % (02/18 0800) FiO2 (%):  [2 %] 2 % (02/17 0933) Last BM Date: 11/15/16  Intake/Output from previous day: 02/17 0701 - 02/18 0700 In: 12275.3 [I.V.:2680.3; NG/GT:30; IV Piggyback:8665] Out: 1910 [Urine:1110; Emesis/NG output:800] Intake/Output this shift: No intake/output data recorded.  General appearance: alert, looks uncomfortable. Resp: breathing comfortably.   GI: non distended.  Dressing c/d/i.   Extremities: mild edema  Lab Results:   Recent Labs  11/25/16 0849 11/26/16 0444  WBC 23.0* 18.7*  HGB 9.1* 9.2*  HCT 27.1* 27.6*  PLT 370 391    BMET  Recent Labs  11/25/16 0518 11/26/16 0444  NA 136 136  K 3.5 3.9  CL 103 103  CO2 28 24  GLUCOSE 123* 133*  BUN 19 19  CREATININE 0.47 0.44  CALCIUM 7.9* 8.1*   PT/INR No results for input(s): LABPROT, INR in the last 72 hours.   Recent Labs Lab 11/20/16 0441 11/23/16 0500 11/25/16 0518  AST 23 49* 121*  ALT 22 42 186*  ALKPHOS 179* 253* 236*  BILITOT 0.7 1.2 1.4*  PROT 6.0* 6.4* 5.4*  ALBUMIN 1.8* 2.1* 1.6*     Lipase     Component Value Date/Time   LIPASE 13 11/11/2016 0945     Studies/Results: Dg Abd Portable 1v  Result Date: 11/24/2016 CLINICAL DATA:  Location of NG tube. Nausea. Status post cholecystectomy on 10/30/2016 with exploratory laparotomy for removal of adhesions on 11/17/2016 and redo exploratory laparatomy today. EXAM: PORTABLE ABDOMEN - 1 VIEW COMPARISON:  11/16/2016 FINDINGS: The tip and side port of a gastric tube is seen below the  left hemidiaphragm in the expected location of the body of the stomach with tip crossing midline reports the right lower quadrant. Mild scattered gas containing small and large bowel loops are noted. Postsurgical clips are seen overlying the pelvis. Patient status post multilevel lumbar spinal fusion. IMPRESSION: The tip and side hole of a gastric tube are in the expected location of the gastric antrum and body. These results will be called to the ordering clinician or representative by the Radiologist Assistant, and communication documented in the PACS or zVision Dashboard. Electronically Signed   By: Ashley Royalty M.D.   On: 11/24/2016 19:55   Prior to Admission medications   Medication Sig Start Date End Date Taking? Authorizing Provider  amLODipine (NORVASC) 10 MG tablet Take 0.5 mg by mouth daily.   Yes Historical Provider, MD  amoxicillin (AMOXIL) 875 MG tablet Take 875 mg by mouth daily. 10/02/16  Yes Historical Provider, MD  aspirin EC 81 MG tablet Take 81 mg by mouth daily.   Yes Historical Provider, MD  cloNIDine (CATAPRES) 0.1 MG tablet Take 0.1 mg by mouth daily.    Yes Historical Provider, MD  hydrochlorothiazide (HYDRODIURIL) 25 MG tablet Take 25 mg by mouth daily.   Yes Historical Provider, MD  Multiple Vitamin (MULTIVITAMIN) tablet Take 1 tablet by mouth daily.   Yes Historical Provider, MD  nebivolol (BYSTOLIC) 5 MG  tablet Take 5 mg by mouth daily.   Yes Historical Provider, MD  omeprazole (PRILOSEC) 40 MG capsule Take 40 mg by mouth daily.   Yes Historical Provider, MD  potassium chloride SA (K-DUR,KLOR-CON) 20 MEQ tablet Take 20 mEq by mouth 2 (two) times daily.   Yes Historical Provider, MD  FLUZONE HIGH-DOSE 0.5 ML SUSY TO BE ADMINISTERED BY PHARMACIST FOR IMMUNIZATION 07/25/16   Historical Provider, MD    Medications: . cloNIDine  0.1 mg Transdermal Weekly  . enoxaparin (LOVENOX) injection  40 mg Subcutaneous Q24H  . HYDROmorphone   Intravenous Q4H  . insulin aspart  0-9 Units  Subcutaneous Q8H  . magnesium sulfate 1 - 4 g bolus IVPB  1 g Intravenous Once  . metoprolol  5 mg Intravenous Q6H  . nitroGLYCERIN  1 inch Topical Q6H  . pantoprazole (PROTONIX) IV  80 mg Intravenous Q12H  . piperacillin-tazobactam (ZOSYN)  IV  3.375 g Intravenous Q8H   . Marland KitchenTPN (CLINIMIX-E) Adult     And  . fat emulsion    . lactated ringers 75 mL/hr at 11/25/16 2120  . Marland KitchenTPN (CLINIMIX-E) Adult 75 mL/hr at 11/26/16 0600    Assessment/Plan Admitted 1/20 - Medicine S/p Laparoscopic Cholecystectomy with intraoperative cholangiogram, 10/30/16, Dr. Rodman Key Martin;Necrotic gallbladder with free intrapertioneal bile; enlarged CBD with free flow into the duodenum POD 27 Exploratory laparotomy with LOA - findings: Mechanical obstruction, 11/17/16, Dr. Dalbert Batman POD 9 Exploratory laparotomy, LOA, reduction of SB volvulus 2/16- Dr. Zella Richer, POD 2   Chest pain with elevated troponin - improved.  Echo EF 0000000 1 diastolic dysfunction/Trivial MR- continue prn nitro Anemia  Stable post transfusion. Severe protein calorie malnutrition - prealbumin 8.3 - on TNA.  Recheck prealbumin tomorrow. HTN - lopressor, prn hydralazine.   COPD- IS GERD-protonix History infected knee after TKA. History MRSA FEN: IV fluids/NPO ID: Zosyn 1/21- 11/16/16, on again 2/16-present. D/c today.  No abscess at time of surgery.  No intraabdominal contamination.   DVT prophylaxis:  Lovenox   NPO, NGT, TNA.  Pulmonary toilet.     LOS: 29 days    Karem Farha 11/26/2016

## 2016-11-26 NOTE — Progress Notes (Signed)
PHARMACY - ADULT TOTAL PARENTERAL NUTRITION CONSULT NOTE   Pharmacy Consult for TPN Indication: prolonged ileus  Patient Measurements: Height: 5\' 2"  (157.5 cm) Weight: 168 lb 6.9 oz (76.4 kg) IBW/kg (Calculated) : 50.1 TPN AdjBW (KG): 56.7 Body mass index is 30.81 kg/m.  Insulin Requirements: 3 units in the last 24hrs.  Current Nutrition: NPO (small aliquots of clear liquids)  IVF: LR @ 36mls/hr  Central access: PICC TPN start date: 11/08/16  ASSESSMENT                                                                                                          HPI: Patient is [name] with hx of gallstones presented to the ED on 1/20 with c/o abdominal pain. MRCP on 1/22 showed distended gallbladder with tiny gallstones and wall thickening with suspicion for acute cholecystitis. Patient underwent lap cholecystectomy with intraoperative cholangiogram on 1/22.  To start TPN for prolonged ileus and poor oral intake.  Significant events:  1/22: lap chole with cholangiogram 1/30: abd KUB with suspicion for post-op ileus; NG tube placed  2/2: high NG output from 1/31-2/1 (1369ml) but decreased 2/1-2/2 (300 ml). RN reported foul smell from NG tube --CCS may d/c NG 2/3: trial clamping NG - successful 2/4 NGT removed. Switching from E 5/20 to E 5/15 to better align with nutritional goals 2/6 leave NGT out, continue NPO, GI consult 2/8 Xray consistent with ileus or SBO - possible ex lap tomorrow 2/9: Underwent exploratory laparotomy, lysis of adhesions.  2/12 Transferred to ICU for chest pain, elevated troponin. 2/16: back to OR for recurrent SBO.  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  Today 11/26/16:   Glucose controlled (goal <150). No hx of DM.  Electrolytes: mag low end of normal, K WNL after repletion, all others WNL  Renal: scr/BUN ok; CrCl 55 ml/min  LFTs: elevated; albumin low  TGs: wnl, 69 (2/12)  Prealbumin: 5.9 (2/5), 8.3 (2/12)  NUTRITIONAL  GOALS                                                                                             RD recs (2/2) Kcal:  1750-1950 Protein:  80-90g Fluid:  1.8L/day  Clinimix E 5/15 (switching from E 5/20 at 70 ml/hr) at a goal rate of 75 ml/hr + 20% fat emulsion at 20 ml/hr x 12 hr to provide:  90 g/day protein, 1758 Kcal/day.  (+++There is currently a Psychologist, prison and probation services of Clinimix solution. Pharmacy will use Clinimix product based on availability+++)  PLAN   Mag sulfate 1gm IV x 1  At 1800 today:  Continue Clinimix E 5/15 at goal rate of 75 ml/hr.  20% fat emulsion at 20 ml/hr over 12 hrs  Trace elements are on national shortage, will be added as available. Will add some in today's bag  Multivitamins will be added daily.  Continue sensitive SSI q8h.   TPN lab panels on Mondays & Thursdays.   Dolly Rias RPh 11/26/2016, 7:34 AM Pager [phone]

## 2016-11-27 ENCOUNTER — Inpatient Hospital Stay (HOSPITAL_COMMUNITY): Payer: Medicare Other

## 2016-11-27 ENCOUNTER — Encounter (HOSPITAL_COMMUNITY): Payer: Self-pay

## 2016-11-27 DIAGNOSIS — R0789 Other chest pain: Secondary | ICD-10-CM

## 2016-11-27 DIAGNOSIS — F419 Anxiety disorder, unspecified: Secondary | ICD-10-CM | POA: Diagnosis present

## 2016-11-27 LAB — COMPREHENSIVE METABOLIC PANEL
ALK PHOS: 278 U/L — AB (ref 38–126)
ALT: 107 U/L — ABNORMAL HIGH (ref 14–54)
ANION GAP: 8 (ref 5–15)
AST: 46 U/L — ABNORMAL HIGH (ref 15–41)
Albumin: 1.7 g/dL — ABNORMAL LOW (ref 3.5–5.0)
BILIRUBIN TOTAL: 1.3 mg/dL — AB (ref 0.3–1.2)
BUN: 17 mg/dL (ref 6–20)
CALCIUM: 7.9 mg/dL — AB (ref 8.9–10.3)
CO2: 23 mmol/L (ref 22–32)
Chloride: 104 mmol/L (ref 101–111)
Creatinine, Ser: 0.44 mg/dL (ref 0.44–1.00)
GFR calc non Af Amer: >60 mL/min (ref >60)
Glucose, Bld: 142 mg/dL — ABNORMAL HIGH (ref 65–99)
Potassium: 3.5 mmol/L (ref 3.5–5.1)
SODIUM: 135 mmol/L (ref 135–145)
TOTAL PROTEIN: 6.1 g/dL — AB (ref 6.5–8.1)

## 2016-11-27 LAB — DIFFERENTIAL
BASOS ABS: 0 10*3/uL (ref 0.0–0.1)
Basophils Relative: 0 %
EOS PCT: 1 %
Eosinophils Absolute: 0.2 10*3/uL (ref 0.0–0.7)
LYMPHS ABS: 1.5 10*3/uL (ref 0.7–4.0)
Lymphocytes Relative: 9 %
MONO ABS: 1.5 10*3/uL — AB (ref 0.1–1.0)
Monocytes Relative: 9 %
Neutro Abs: 13.2 10*3/uL — ABNORMAL HIGH (ref 1.7–7.7)
Neutrophils Relative %: 81 %

## 2016-11-27 LAB — CBC
HCT: 27.4 % — ABNORMAL LOW (ref 36.0–46.0)
Hemoglobin: 9.2 g/dL — ABNORMAL LOW (ref 12.0–15.0)
MCH: 27.1 pg (ref 26.0–34.0)
MCHC: 33.6 g/dL (ref 30.0–36.0)
MCV: 80.8 fL (ref 78.0–100.0)
PLATELETS: 417 10*3/uL — AB (ref 150–400)
RBC: 3.39 MIL/uL — ABNORMAL LOW (ref 3.87–5.11)
RDW: 17.6 % — AB (ref 11.5–15.5)
WBC: 16.4 10*3/uL — AB (ref 4.0–10.5)

## 2016-11-27 LAB — TROPONIN I
Troponin I: 0.15 ng/mL (ref <0.03)
Troponin I: 0.12 ng/mL (ref <0.03)
Troponin I: 0.11 ng/mL (ref <0.03)

## 2016-11-27 LAB — PREALBUMIN: PREALBUMIN: 6 mg/dL — AB (ref 18–38)

## 2016-11-27 LAB — MAGNESIUM: MAGNESIUM: 1.7 mg/dL (ref 1.7–2.4)

## 2016-11-27 LAB — GLUCOSE, CAPILLARY
Glucose-Capillary: 157 mg/dL — ABNORMAL HIGH (ref 65–99)
Glucose-Capillary: 138 mg/dL — ABNORMAL HIGH (ref 65–99)
Glucose-Capillary: 130 mg/dL — ABNORMAL HIGH (ref 65–99)

## 2016-11-27 LAB — TRIGLYCERIDES: TRIGLYCERIDES: 111 mg/dL (ref <150)

## 2016-11-27 LAB — PHOSPHORUS: PHOSPHORUS: 3.3 mg/dL (ref 2.5–4.6)

## 2016-11-27 MED ORDER — HYDROMORPHONE HCL 1 MG/ML IJ SOLN
1.0000 mg | Freq: Once | INTRAMUSCULAR | Status: AC
  Administered 2016-11-27: 1 mg via INTRAVENOUS
  Filled 2016-11-27: qty 1

## 2016-11-27 MED ORDER — ONDANSETRON HCL 4 MG/2ML IJ SOLN: 4.0000 mg | Freq: Four times a day (QID) | INTRAMUSCULAR | Status: DC | PRN

## 2016-11-27 MED ORDER — NALOXONE HCL 0.4 MG/ML IJ SOLN: 0.4000 mg | INTRAMUSCULAR | Status: DC | PRN

## 2016-11-27 MED ORDER — POLYVINYL ALCOHOL 1.4 % OP SOLN
1.0000 [drp] | OPHTHALMIC | Status: DC | PRN
  Filled 2016-11-27: qty 15

## 2016-11-27 MED ORDER — CLONIDINE HCL 0.1 MG PO TABS
0.1000 mg | ORAL_TABLET | Freq: Once | ORAL | Status: DC
  Filled 2016-11-27: qty 1

## 2016-11-27 MED ORDER — DEXTROSE 5 % IV SOLN
1000.0000 mg | Freq: Three times a day (TID) | INTRAVENOUS | Status: AC
  Administered 2016-11-27 – 2016-11-29 (×8): 1000 mg via INTRAVENOUS
  Filled 2016-11-27 (×11): qty 10

## 2016-11-27 MED ORDER — FAT EMULSION 20 % IV EMUL
250.0000 mL | INTRAVENOUS | Status: AC
  Administered 2016-11-27: 250 mL via INTRAVENOUS
  Filled 2016-11-27: qty 250

## 2016-11-27 MED ORDER — HYDRALAZINE HCL 20 MG/ML IJ SOLN
20.0000 mg | Freq: Four times a day (QID) | INTRAMUSCULAR | Status: DC
  Administered 2016-11-27 – 2016-12-01 (×18): 20 mg via INTRAVENOUS
  Filled 2016-11-27 (×19): qty 1

## 2016-11-27 MED ORDER — IOPAMIDOL (ISOVUE-370) INJECTION 76%
INTRAVENOUS | Status: AC
  Filled 2016-11-27: qty 100

## 2016-11-27 MED ORDER — AMLODIPINE BESYLATE 10 MG PO TABS: 10.0000 mg | ORAL_TABLET | Freq: Every day | ORAL | Status: DC

## 2016-11-27 MED ORDER — CLONIDINE HCL 0.2 MG/24HR TD PTWK
0.2000 mg | MEDICATED_PATCH | TRANSDERMAL | Status: DC
  Administered 2016-11-27 – 2016-12-04 (×2): 0.2 mg via TRANSDERMAL
  Filled 2016-11-27 (×2): qty 1

## 2016-11-27 MED ORDER — DIPHENHYDRAMINE HCL 50 MG/ML IJ SOLN: 12.5000 mg | Freq: Four times a day (QID) | INTRAMUSCULAR | Status: DC | PRN

## 2016-11-27 MED ORDER — DIPHENHYDRAMINE HCL 12.5 MG/5ML PO ELIX: 12.5000 mg | ORAL_SOLUTION | Freq: Four times a day (QID) | ORAL | Status: DC | PRN

## 2016-11-27 MED ORDER — IOPAMIDOL (ISOVUE-370) INJECTION 76%
100.0000 mL | Freq: Once | INTRAVENOUS | Status: AC | PRN
  Administered 2016-11-27: 100 mL via INTRAVENOUS

## 2016-11-27 MED ORDER — CLONIDINE HCL 0.2 MG/24HR TD PTWK: 0.2000 mg | MEDICATED_PATCH | TRANSDERMAL | Status: DC

## 2016-11-27 MED ORDER — PROCHLORPERAZINE EDISYLATE 5 MG/ML IJ SOLN: 5.0000 mg | INTRAMUSCULAR | Status: DC | PRN

## 2016-11-27 MED ORDER — NICARDIPINE HCL IN NACL 20-0.86 MG/200ML-% IV SOLN
3.0000 mg/h | INTRAVENOUS | Status: DC
  Administered 2016-11-27: 5 mg/h via INTRAVENOUS
  Administered 2016-11-27: 15 mg/h via INTRAVENOUS
  Filled 2016-11-27 (×3): qty 200

## 2016-11-27 MED ORDER — HYDROMORPHONE HCL 1 MG/ML IJ SOLN
1.0000 mg | INTRAMUSCULAR | Status: DC | PRN
  Administered 2016-12-02: 1 mg via INTRAVENOUS
  Administered 2016-12-04: 2 mg via INTRAVENOUS
  Filled 2016-11-27: qty 1
  Filled 2016-11-27: qty 2

## 2016-11-27 MED ORDER — NICARDIPINE HCL IN NACL 40-0.83 MG/200ML-% IV SOLN
3.0000 mg/h | INTRAVENOUS | Status: DC
  Administered 2016-11-27: 3 mg/h via INTRAVENOUS
  Administered 2016-11-27: 5 mg/h via INTRAVENOUS
  Filled 2016-11-27 (×2): qty 200

## 2016-11-27 MED ORDER — ALUM & MAG HYDROXIDE-SIMETH 200-200-20 MG/5ML PO SUSP
30.0000 mL | Freq: Four times a day (QID) | ORAL | Status: DC | PRN
  Administered 2016-12-06: 30 mL via ORAL
  Filled 2016-11-27 (×2): qty 30

## 2016-11-27 MED ORDER — CLONIDINE HCL 0.1 MG PO TABS: 0.1000 mg | ORAL_TABLET | Freq: Once | ORAL | Status: DC

## 2016-11-27 MED ORDER — MAGIC MOUTHWASH
15.0000 mL | Freq: Four times a day (QID) | ORAL | Status: DC | PRN
  Filled 2016-11-27: qty 15

## 2016-11-27 MED ORDER — LORAZEPAM 2 MG/ML IJ SOLN: 0.5000 mg | Freq: Three times a day (TID) | INTRAMUSCULAR | Status: DC | PRN

## 2016-11-27 MED ORDER — BISACODYL 10 MG RE SUPP
10.0000 mg | Freq: Every day | RECTAL | Status: DC
  Administered 2016-11-27 – 2016-12-04 (×7): 10 mg via RECTAL
  Filled 2016-11-27 (×9): qty 1

## 2016-11-27 MED ORDER — MAGNESIUM SULFATE 2 GM/50ML IV SOLN
2.0000 g | Freq: Once | INTRAVENOUS | Status: AC
  Administered 2016-11-27: 2 g via INTRAVENOUS
  Filled 2016-11-27: qty 50

## 2016-11-27 MED ORDER — HYDROMORPHONE 1 MG/ML IV SOLN
INTRAVENOUS | Status: DC
  Administered 2016-11-27: 0.2 mg via INTRAVENOUS
  Administered 2016-11-27 (×2): 0.8 mg via INTRAVENOUS
  Administered 2016-11-27: 5 mg via INTRAVENOUS
  Administered 2016-11-27: 1 mg via INTRAVENOUS
  Administered 2016-11-28: 2.2 mg via INTRAVENOUS
  Administered 2016-11-28: 1.6 mg via INTRAVENOUS
  Administered 2016-11-28: 7 mg via INTRAVENOUS
  Administered 2016-11-28: 0.6 mg via INTRAVENOUS
  Administered 2016-11-28: 4 mg via INTRAVENOUS
  Administered 2016-11-28: 0.4 mg via INTRAVENOUS
  Administered 2016-11-29: 0.8 mg via INTRAVENOUS
  Administered 2016-11-29: 0.6 mg via INTRAVENOUS
  Administered 2016-11-29: 1.2 mg via INTRAVENOUS
  Filled 2016-11-27: qty 25

## 2016-11-27 MED ORDER — LIP MEDEX EX OINT
1.0000 "application " | TOPICAL_OINTMENT | Freq: Two times a day (BID) | CUTANEOUS | Status: DC
  Administered 2016-11-27 – 2016-12-09 (×24): 1 via TOPICAL
  Filled 2016-11-27 (×2): qty 7

## 2016-11-27 MED ORDER — POTASSIUM CHLORIDE 2 MEQ/ML IV SOLN
30.0000 meq | Freq: Once | INTRAVENOUS | Status: AC
  Administered 2016-11-27: 30 meq via INTRAVENOUS
  Filled 2016-11-27: qty 15

## 2016-11-27 MED ORDER — ACETAMINOPHEN 10 MG/ML IV SOLN
1000.0000 mg | Freq: Four times a day (QID) | INTRAVENOUS | Status: AC
  Administered 2016-11-27 – 2016-11-28 (×4): 1000 mg via INTRAVENOUS
  Filled 2016-11-27 (×6): qty 100

## 2016-11-27 MED ORDER — M.V.I. ADULT IV INJ
INTRAVENOUS | Status: AC
  Administered 2016-11-27: 18:00:00 via INTRAVENOUS
  Filled 2016-11-27 (×2): qty 1800

## 2016-11-27 MED ORDER — SODIUM CHLORIDE 0.9 % IV SOLN
25.0000 mg | Freq: Four times a day (QID) | INTRAVENOUS | Status: DC | PRN
  Filled 2016-11-27: qty 1

## 2016-11-27 MED ORDER — PANTOPRAZOLE SODIUM 40 MG IV SOLR
40.0000 mg | Freq: Two times a day (BID) | INTRAVENOUS | Status: DC
  Administered 2016-11-27 – 2016-12-07 (×20): 40 mg via INTRAVENOUS
  Filled 2016-11-27 (×21): qty 40

## 2016-11-27 MED ORDER — METOPROLOL TARTRATE 5 MG/5ML IV SOLN
10.0000 mg | Freq: Four times a day (QID) | INTRAVENOUS | Status: DC
  Administered 2016-11-27 – 2016-12-01 (×19): 10 mg via INTRAVENOUS
  Filled 2016-11-27 (×20): qty 10

## 2016-11-27 MED ORDER — LACTATED RINGERS IV BOLUS (SEPSIS): 1000.0000 mL | Freq: Three times a day (TID) | INTRAVENOUS | Status: DC | PRN

## 2016-11-27 MED ORDER — SODIUM CHLORIDE 0.9 % IV SOLN
INTRAVENOUS | Status: DC
  Administered 2016-11-27 – 2016-11-28 (×3): via INTRAVENOUS

## 2016-11-27 MED ORDER — HYDROCHLOROTHIAZIDE 25 MG PO TABS: 25.0000 mg | ORAL_TABLET | Freq: Every day | ORAL | Status: DC

## 2016-11-27 MED ORDER — SODIUM CHLORIDE 0.9 % IJ SOLN
INTRAMUSCULAR | Status: AC
  Filled 2016-11-27: qty 50

## 2016-11-27 MED ORDER — SODIUM CHLORIDE 0.9% FLUSH: 9.0000 mL | INTRAVENOUS | Status: DC | PRN

## 2016-11-27 NOTE — Progress Notes (Signed)
[place] NOTE   Pharmacy Consult for TPN Indication: prolonged ileus  Patient Measurements: Height: 5\' 2"  (157.5 cm) Weight: 197 lb 12 oz (89.7 kg) IBW/kg (Calculated) : 50.1 TPN AdjBW (KG): 56.7 Body mass index is 36.17 kg/m.  Insulin Requirements: 5 units in the last 24hrs.  Current Nutrition: NPO (ice chips, sips with meds)  IVF: NS at 39ml/hr while on Cardene gtt, switch back to LR @ 57mls/hr once off Cardene gtt  Central access: PICC TPN start date: 11/08/16  ASSESSMENT                                                                                                          HPI: Patient is [name] with hx of gallstones presented to the ED on 1/20 with c/o abdominal pain. MRCP on 1/22 showed distended gallbladder with tiny gallstones and wall thickening with suspicion for acute cholecystitis. Patient underwent lap cholecystectomy with intraoperative cholangiogram on 1/22.  To start TPN for prolonged ileus and poor oral intake.  Significant events:  1/22: lap chole with cholangiogram 1/30: abd KUB with suspicion for post-op ileus; NG tube placed  2/2: high NG output from 1/31-2/1 (1339ml) but decreased 2/1-2/2 (300 ml). RN reported foul smell from NG tube --CCS may d/c NG 2/3: trial clamping NG - successful 2/4 NGT removed. Switching from E 5/20 to E 5/15 to better align with nutritional goals 2/6 leave NGT out, continue NPO, GI consult 2/8 Xray consistent with ileus or SBO - possible ex lap tomorrow 2/9: Underwent exploratory laparotomy, lysis of adhesions.  2/16: back to OR for recurrent SBO.  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  Today 11/27/16:   Glucose mostly controlled (one value above goal <150). No hx of DM.  Electrolytes: Mag/K at low end of normal, all others WNL including corrected Ca  Renal: SCr/BUN ok; CrCl 60 ml/min  LFTs: AlkPhos elevated and rising (TPN related?); ALT ~2x ULN, Tbili  slightly elevated, albumin low  TGs: wnl, 69 (2/12), pending this AM  Prealbumin: 5.9 (2/5), 8.3 (2/12), pending this AM  NUTRITIONAL GOALS                                                                                             RD recs (2/2) Kcal:  1750-1950 Protein:  80-90g Fluid:  1.8L/day  Clinimix E 5/15 (switched from E 5/20 at 70 ml/hr due to availability) at a goal rate of 75 ml/hr + 20% fat emulsion at 20 ml/hr x 12 hr to provide:  90 g/day protein, 1758 Kcal/day.  (+++There is currently a Psychologist, prison and probation services of Clinimix solution. Pharmacy will use Clinimix product based on  availability+++)  PLAN   Now: Magnesium sulfate 2g IV x 1, KCl 14meq IV x 1  At 1800 today:  Continue Clinimix E 5/15 at goal rate of 75 ml/hr.  20% fat emulsion at 20 ml/hr over 12 hrs  Trace elements are on national shortage, will be added as available. Will omit today.  Multivitamins will be added daily.  Continue sensitive SSI q8h.   TPN lab panels on Mondays & Thursdays.   Peggyann Juba, PharmD, BCPS Pager: [phone] 11/27/2016, 7:31 AM

## 2016-11-27 NOTE — Progress Notes (Signed)
Pt complained of chest pain, was moaning and becoming tearful. Pt praying and asking God to "take her and relieve her pain", pt stating she "no longer wanted to fight and wanted to rest." RN paged Dr. Maudie Mercury regarding pt chest pain, worsening abdominal pain and elevated BP.  KUB, EKG, and Clonidine per tube ordered. RN reassessed NG tube placement and noticed NG tubed to be 1-2 cm out of place. Upon auscultation can be heard in upper stomach location. NG tube still suctioning out yellow, green fluid. RN awaiting KUB result before administering Clonidine per tube, MD aware.  Dr. Loleta Books at bedside, suggested RN consult on-call, Dr. Johney Maine, for surgery. RN informed Dr. Johney Maine of pt BP, increased abdominal pain throughout night, and pt emotional state. Dr. Johney Maine suggests additional means of pain control. Dr. Johney Maine ordered one-time dose of IV Dilaudid push and added PRN IV Dilaudid, scheduled Robaxin, ice to affected area, and other changes in orders were made. Pt started on Cardene gtt per Dr. Loleta Books for HTN and elevated Troponin.   Dr. Loleta Books ordered CT of Chest/Abd/Pel, will take pt to CT now that pt has been started on Cardene. Will continue to monitor.

## 2016-11-27 NOTE — Progress Notes (Signed)
CRITICAL VALUE ALERT  Critical value received:  Trop 0.15  Date of notification:  11/27/16  Time of notification:  [id number]  Critical value read back:Yes.    Nurse who received alert:  Kennis Carina, RN  MD notified (1st page):  Dr. Loleta Books  Time of first page:  At bedside, 0145  MD notified (2nd page):  Time of second page:  Responding MD:  Dr. Loleta Books  Time MD responded:  [phone]

## 2016-11-27 NOTE — Progress Notes (Signed)
PROGRESS NOTE    Melanie [name]  [id number] DOB: 11-Dec-1935 DOA: 10/28/2016 PCP: Osborne Casco, MD   Brief Narrative:  81 yo female with prolonged hospitalization, admitted on 01/20 for necrotic cholecystitis. Developed post op bowel obstruction, re-intervened on 02/09 with exploratory laparotomy. NG tube in place, on TPN. Consulted for chest pain that ruled out for ACS. Patient has been in persistent pain, on PCA pump. Patient had third surgical intervention, emergent laparatomy, recurrent bowel obstruction with volvulus with ischemic segment. Resumed clonidine for blood pressure control. Worsening hypertension, started on nicardipine drip.   Assessment & Plan:   Principal Problem:   Small bowel obstruction s/p ex lap & lysis of adhesions 11/17/2016 Active Problems:   Necrotic cholecystitis s/p lap cholecystectomy 10/30/2016   Essential hypertension   Hyperlipidemia   COPD (chronic obstructive pulmonary disease) (HCC)   GERD (gastroesophageal reflux disease)   Chest pain   H/O exploratory laparotomy   Anxiousness  1. Necrotic cholecystitis. Patient continue NPO. NG tube in place. Supportive post operative care per surgery.  2. Post operative bowel obstruction complicated with volvulus and ischemic bowel and sepsis features. Sp exploratory laparotomy, continueIV fluids, IV antibiotics with Zosyn. WBC down to 16 from 18.7. Pain control with pca pump.  4. HTN uncontrolled. Patient placed with nicardipine infusion, will increase clonidine at 0.2 mg patch, will target blood pressure systolic less than [id number]. Troponin elevation likely due to uncontrolled hypertension.  5. COPD. Continue aspiration precautions, oxymetry monitoring. No signs of exacerbation.   6. Iron deficiency anemia.  Hb stable at 9,2 after blood transfusion 2 units. No signs of bleeding, plan for po Iron when tolerated.   DVT prophylaxis:enoxaparin  Code Status:full Family Communication:No family at  the bedside.  Disposition Plan:Home/ SNF   Consultants:    Procedures: 02/16 Exploratory laparotomy with volvulus and ischemic bowel.   02/09 Exploratory laparotomy, lysis of adhesions  01/22 Laparoscopic Cholecystectomy with intraoperative cholangiogram.    Antimicrobials:   Zosyn     Subjective: Patient had episode of pain last night, with worsening hypertension, placed on nicardipine drip. This am pain still present but less intensity, no nausea or vomiting.   Objective: Vitals:   11/27/16 0753 11/27/16 0759 11/27/16 0800 11/27/16 0815  BP:   (!) 185/62 (!) 174/63  Pulse:   97 97  Resp:  (!) 21 (!) 25 (!) 23  Temp: 97.8 F (36.6 C)     TempSrc: Oral     SpO2:  96% 97% 96%  Weight:      Height:        Intake/Output Summary (Last 24 hours) at 11/27/16 0852 Last data filed at 11/27/16 0800  Gross per 24 hour  Intake          3881.25 ml  Output             1720 ml  Net          2161.25 ml   Filed Weights   11/08/16 1347 11/20/16 0946 11/27/16 0400  Weight: 74.3 kg (163 lb 11.2 oz) 76.4 kg (168 lb 6.9 oz) 89.7 kg (197 lb 12 oz)    Examination:  General exam: deconditioned and ill looking appearing E ENT: mild pallor, oral mucosa dry, positive NG tube.  Respiratory system: Clear to auscultation. Decreased inspiratory effort, no wheezing, rales or rhonchi.  Cardiovascular system: S1 & S2 heard, RRR. No JVD, murmurs, rubs, gallops or clicks. No pedal edema. Gastrointestinal system: Abdomen distended, soft but tender, to superficial palpation. No  organomegaly or masses felt. Normal bowel sounds heard. Central nervous system: Alert and oriented. No focal neurological deficits. Extremities: Symmetric 5 x 5 power. Skin: No rashes, lesions or ulcers   Data Reviewed: I have personally reviewed following labs and imaging studies  CBC:  Recent Labs Lab 11/23/16 0026 11/24/16 0557 11/25/16 0849 11/26/16 0444 11/27/16 0444   WBC 20.1* 24.4* 23.0* 18.7* 16.4*  NEUTROABS  --   --   --   --  13.2*  HGB 7.6* 6.7* 9.1* 9.2* 9.2*  HCT 22.9* 20.3* 27.1* 27.6* 27.4*  MCV 79.5 79.0 79.9 81.4 80.8  PLT 405* 408* 370 391 [id number]*   Basic Metabolic Panel:  Recent Labs Lab 11/21/16 0430 11/23/16 0500 11/24/16 0557 11/25/16 0518 11/26/16 0444 11/27/16 0444  NA 133* 136 135 136 136 135  K 4.0 4.1 3.9 3.5 3.9 3.5  CL 102 104 103 103 103 104  CO2 25 25 25 28 24 23   GLUCOSE 132* 133* 131* 123* 133* 142*  BUN 19 23* 20 19 19 17   CREATININE 0.44 0.53 0.59 0.47 0.44 0.44  CALCIUM 8.0* 8.4* 8.0* 7.9* 8.1* 7.9*  MG 2.0 1.7  --  1.5* 1.8 1.7  PHOS 3.6 4.0  --  3.2  --  3.3   GFR: Estimated Creatinine Clearance: 60.3 mL/min (by C-G formula based on SCr of 0.44 mg/dL). Liver Function Tests:  Recent Labs Lab 11/23/16 0500 11/25/16 0518 11/27/16 0444  AST 49* 121* 46*  ALT 42 186* 107*  ALKPHOS 253* 236* 278*  BILITOT 1.2 1.4* 1.3*  PROT 6.4* 5.4* 6.1*  ALBUMIN 2.1* 1.6* 1.7*   No results for input(s): LIPASE, AMYLASE in the last 168 hours. No results for input(s): AMMONIA in the last 168 hours. Coagulation Profile: No results for input(s): INR, PROTIME in the last 168 hours. Cardiac Enzymes:  Recent Labs Lab 11/20/16 1743 11/20/16 2047 11/21/16 1406 11/27/16 0032 11/27/16 0611  TROPONINI 0.05* 0.05* 0.05* 0.15* 0.12*   BNP (last 3 results) No results for input(s): PROBNP in the last 8760 hours. HbA1C: No results for input(s): HGBA1C in the last 72 hours. CBG:  Recent Labs Lab 11/26/16 0604 11/26/16 1116 11/26/16 1359 11/26/16 2150 11/27/16 0612  GLUCAP 127* 117* 125* 125* 157*   Lipid Profile:  Recent Labs  11/27/16 0444  TRIG 111   Thyroid Function Tests: No results for input(s): TSH, T4TOTAL, FREET4, T3FREE, THYROIDAB in the last 72 hours. Anemia Panel: No results for input(s): VITAMINB12, FOLATE, FERRITIN, TIBC, IRON, RETICCTPCT in the last 72 hours. Sepsis Labs:  Recent  Labs Lab 11/24/16 1522 11/25/16 0518 11/26/16 0444  PROCALCITON 0.56 0.65 3.51    Recent Results (from the past 240 hour(s))  Urine culture     Status: None   Collection Time: 11/20/16  8:51 AM  Result Value Ref Range Status   Specimen Description URINE, CATHETERIZED  Final   Special Requests NONE  Final   Culture   Final    NO GROWTH Performed at [facility], [place] [address] [place], [place] [address]    Report Status 11/21/2016 FINAL  Final  MRSA PCR Screening     Status: None   Collection Time: 11/20/16  3:50 PM  Result Value Ref Range Status   MRSA by PCR NEGATIVE NEGATIVE Final    Comment:        The GeneXpert MRSA Assay (FDA approved for NASAL specimens only), is one component of a comprehensive MRSA colonization surveillance program. It is not intended  to diagnose MRSA infection nor to guide or monitor treatment for MRSA infections.   Culture, blood (Routine X 2) w Reflex to ID Panel     Status: None (Preliminary result)   Collection Time: 11/24/16  1:45 PM  Result Value Ref Range Status   Specimen Description BLOOD LEFT HAND  Final   Special Requests BOTTLES DRAWN AEROBIC AND ANAEROBIC 5CC EACH  Final   Culture   Final    NO GROWTH 2 DAYS Performed at [facility], [place] [address], [place], [place] [address]    Report Status PENDING  Incomplete  Culture, blood (Routine X 2) w Reflex to ID Panel     Status: None (Preliminary result)   Collection Time: 11/24/16  1:50 PM  Result Value Ref Range Status   Specimen Description BLOOD LEFT HAND  Final   Special Requests IN PEDIATRIC BOTTLE .[place]  Final   Culture   Final    NO GROWTH 2 DAYS Performed at [facility], [place] [address] [place], [place] [address]    Report Status PENDING  Incomplete         Radiology Studies: Dg Abd 1 View  Result Date: 11/27/2016 CLINICAL DATA:  81 year old female with abdominal pain EXAM: ABDOMEN - 1 VIEW COMPARISON:  abdominal radiograph dated 11/24/2016  FINDINGS: Partially visualized enteric tube in similar position as the prior radiograph and likely in the distal stomach. There is persistent mildly distended air-filled loop of small bowel in the left lower quadrant measuring up to 3.6 cm in diameter similar to the prior radiograph. Air is noted within the colon. No free air identified on the provided image. Right upper quadrant cholecystectomy clips noted. There is degenerative changes of the spine with lower lumbar laminectomy and fusion hardware. There is sclerotic changes of sacroiliitis bilaterally. No acute fracture. Midline vertical anterior pelvic wall surgical staples. The soft tissues are grossly unremarkable. IMPRESSION: 1. Stable positioning of the enteric tube with tip in the distal stomach. 2. Stable mildly dilated air-filled loop of small bowel in the left lower quadrant measuring up to 3.6 cm in diameter. Follow-up recommended. Electronically Signed   By: Anner Crete M.D.   On: 11/27/2016 02:03   Ct Angio Chest/abd/pel For Dissection W And/or W/wo  Result Date: 11/27/2016 CLINICAL DATA:  81 year old female with chest and abdominal pain. EXAM: CT ANGIOGRAPHY CHEST, ABDOMEN AND PELVIS TECHNIQUE: Multidetector CT imaging through the chest, abdomen and pelvis was performed using the standard protocol during bolus administration of intravenous contrast. Multiplanar reconstructed images and MIPs were obtained and reviewed to evaluate the vascular anatomy. CONTRAST:  100 cc Isovue 370 COMPARISON:  Abdominal CT 11/23/2016 FINDINGS: CTA CHEST FINDINGS Cardiovascular: There is mild cardiomegaly with biatrial dilatation. Multi vessel coronary vascular disease as well as calcification of the mitral annulus noted. There is a retrograde flow of contrast from the right atrium into the IVC compatible with a degree of right cardiac dysfunction. No pericardial effusion. There is atherosclerotic calcification of the thoracic aorta and the origins of the great  vessels of the aortic arch. There is common origin of the left common carotid artery and right brachiocephalic trunk. No aortic aneurysm or dissection. Evaluation of the pulmonary arteries is limited due to suboptimal opacification of the peripheral branches as well as respiratory motion artifact. No definite central pulmonary artery embolus identified. V/Q scan may provide better evaluation if there is high clinical concern for pulmonary embolism. Mediastinum/Nodes: There is no hilar or mediastinal adenopathy. An  enteric tube is noted within the esophagus. Lungs/Pleura: There is emphysematous changes of the lungs. There are small bilateral pleural effusions, new from prior CT of 11/20/2016. Subsegmental consolidative changes of the lung bases noted which may be related to compressive atelectasis or pneumonia. No pneumothorax. The central airways are patent. Musculoskeletal: Top-normal bilateral axillary lymph nodes of indeterminate etiology, possibly reactive. Clinical correlation is recommended. There is diffuse subcutaneous soft tissue edema and anasarca. No fluid collection. There is degenerative changes of the spine. No acute fracture. Review of the MIP images confirms the above findings. CTA ABDOMEN AND PELVIS FINDINGS VASCULAR Evaluation is limited due to respiratory motion artifact as well as streak artifact caused by metallic spinal hardware. Aorta: Advanced aortoiliac atherosclerotic disease. No aneurysmal dilatation or evidence of dissection Celiac: There is atherosclerotic calcification of the origin of the celiac axis with high-grade inclusion of the vessel. Only a small amount of contrast noted in the proximal portion of the celiac axis. SMA: Advanced atherosclerotic calcification of the origin of the SMA with diminished flow within the vessel. Renals: There is atherosclerotic calcification of the ostia of the renal arteries. The renal arteries however remain patent. IMA: There is atherosclerotic  calcification of the origin of the IMA. The IMA remain patent. Inflow: The iliac arteries remain patent. No aneurysmal dilatation for dissection. Veins: No obvious venous abnormality within the limitations of this arterial phase study. Review of the MIP images confirms the above findings. NON-VASCULAR Evaluation of the structures is very limited due to anasarca and artifact caused by metallic spinal hardware. Small pocket of extraluminal air in the anterior pelvis (series 10 image 144) is likely related to recent surgery. Small pocket of air noted along the midline vertical surgical incision. There is a small ascites and diffuse mesenteric edema. Hepatobiliary: Cholecystectomy. The liver is grossly unremarkable with limited evaluation. Pancreas: There is limited visualization of the pancreas. The pancreas is grossly unremarkable. Spleen: Grossly unremarkable on limited evaluation. Adrenals/Urinary Tract: The left adrenal gland appears unremarkable. The right adrenal gland is not well visualized. There is no hydronephrosis on either side. The urinary bladder is partially decompressed around a Foley catheter. Stomach/Bowel: Evaluation of the bowel is very limited in the absence of oral contrast. An enteric feeding extends into distal stomach. Multiple top-normal caliber fluid-filled loops of small bowel throughout the abdomen may represent postsurgical ileus. Thickened appearance of loops of small bowel in the lower abdomen and pelvis likely related to underdistention and ascites. Enteritis is not excluded. Lymphatic: No adenopathy. Reproductive: The uterus is not well visualized and limited pain evaluation. The uterus appears heterogeneous and probably contains a fibroid. Other: There is diffuse subcutaneous edema and anasarca. Midline vertical anterior pelvic wall incisional scar noted. Small amount of fluid noted along the surgical incision. A small pockets of air is also seen in the subcutaneous soft tissues of  the midline anterior abdomen along the surgical incision. Musculoskeletal: Multilevel degenerative changes of the spine. Lower lumbar laminectomy and posterior fusion hardware. No acute fracture. Review of the MIP images confirms the above findings. IMPRESSION: 1. Emphysema with interval development of small bilateral pleural effusions and bibasilar subsegmental atelectasis versus less likely infiltrate. 2. Cardiomegaly with biatrial dilatation and evidence of right cardiac dysfunction. Correlation with echocardiogram recommended. 3. Small ascites, diffuse subcutaneous soft tissue edema and anasarca. 4. 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 Evaluation of the vessels is very limited due to artifact. There is advanced aortoiliac atherosclerotic  disease. 5. Mildly dilated fluid-filled loops of small bowel elbow likely related to ileus. Overall there has been interval improvement in the degree of bowel dilatation compared to the prior CT. Clinical correlation and follow-up recommended. Thickened appearing loops of small bowel likely related to under distention and ascites. Enteritis is less likely. 6. Postsurgical changes with focal small pockets of extraluminal air. No drainable fluid collection/abscess. Electronically Signed   By: Anner Crete M.D.   On: 11/27/2016 05:35        Scheduled Meds: . acetaminophen  1,000 mg Intravenous Q6H  . amLODipine  10 mg Oral Daily  . bisacodyl  10 mg Rectal Daily  . [START ON 12/02/2016] cloNIDine  0.2 mg Transdermal Weekly  . cloNIDine  0.1 mg Per Tube Once  . enoxaparin (LOVENOX) injection  40 mg Subcutaneous Q24H  . hydrochlorothiazide  25 mg Oral Daily  . HYDROmorphone   Intravenous Q4H  . insulin aspart  0-9 Units Subcutaneous Q8H  . iopamidol      . lip balm  1 application Topical BID  . magnesium sulfate 1 - 4 g bolus IVPB  2 g Intravenous Once  . methocarbamol (ROBAXIN)  IV  1,000 mg Intravenous Q8H  .  metoprolol  10 mg Intravenous Q6H  . nitroGLYCERIN  1 inch Topical Q6H  . pantoprazole (PROTONIX) IV  80 mg Intravenous Q12H  . pantoprazole (PROTONIX) IV  40 mg Intravenous Q12H  . potassium chloride (KCL MULTIRUN) 30 mEq in 265 mL IVPB  30 mEq Intravenous Once  . sodium chloride       Continuous Infusions: . sodium chloride 50 mL/hr at 11/27/16 0800  . Marland KitchenTPN (CLINIMIX-E) Adult     And  . fat emulsion    . niCARDipine 5 mg/hr (11/27/16 0800)  . Marland KitchenTPN (CLINIMIX-E) Adult 75 mL/hr at 11/27/16 0800     LOS: 30 days        Tymere Depuy Gerome Apley, MD [facility] Pager [phone]  If 7PM-7AM, please contact night-coverage [email or web address] Password [facility] 11/27/2016, 8:52 AM

## 2016-11-27 NOTE — Progress Notes (Addendum)
Hospitalist Cross-Coverage Note  Called to bedside by nursing for chest pain (our service is a Optometrist for chest pain this week).  Admitted to [facility] service on 01/20 for necrotic cholecystitis, developed post-op bowel obstruction, re-intervened on 02/09 with exploratory laparotomy, then again three days ago, another emergent laparatomy, recurrent bowel obstruction with volvulus with ischemic segment. NG tube in place, on TPN. On PCA pump. Onset tonight, severe, constant in chest and abdomen.  My partner by phone had ordered troponin and ECG already.  On my arrival, patient restless, crying out, barely able to speak from pain.  Indicates pain "all down my front" indicating mid-chest to abdomen, appears to be in distress.  BP (!) 220/60   Pulse (!) 101   Temp 99.1 F (37.3 C) (Axillary)   Resp 17   Ht 5\' 2"  (1.575 m)   Wt 76.4 kg (168 lb 6.9 oz)   SpO2 96%   BMI 30.81 kg/m   Patient mentating well, oriented, uncomfortable.  Cor no murmurs, tachycardic.  RR fast, lung sounds shallow.  Abdomen is firm, not distended, cannot tell if voluntary or involuntary guarding, patient tender.    Assessment and plan: Discussed with Dr. Johney Maine of the primary team.  Evidently the patient has had previous episodes of similar (extreme pain, restless, crying out) with negative workup.    Likely elevated troponin in setting of hypertensive urgency from pain.  But in light of change in troponin, degree of pain, abrupt onset, sustained severe range BP, I feel concern for dissection warrants CT now. -CT angiogram for dissection -Nicardipine gtt titrated to 140  -Trend troponin    ADDENDUM:  CTA shows no dissection, PE.  There are expected post-surgical changes, no abnormal free air, "Overall there has been interval improvement in the degree of bowel dilatation compared to the prior CT".  There is celiac axis stenosis.    The troponin leak appears to be from severe range hypertension.  Will titrate to goal  [id number] systolic on Cardene drip.  Continue efforts at pain control, anxiolysis.  Continue to trend troponin.

## 2016-11-27 NOTE — Progress Notes (Addendum)
3 Days Post-Op  Subjective: She looks OK this AM.  Hard night with lots of pain.  She got CT Angio this AM, with findings below.  This AM she is groaning some, but no real distress.  NG not working well I fixed it.  Foley in.   She is alert mentally, much more comfortable this AM than when I saw her last Friday.    Objective: Vital signs in last 24 hours: Temp:  [98 F (36.7 C)-99.2 F (37.3 C)] 99.2 F (37.3 C) (02/19 0326) Pulse Rate:  [82-115] 95 (02/19 0700) Resp:  [14-28] 22 (02/19 0700) BP: (145-221)/(39-75) 173/55 (02/19 0700) SpO2:  [95 %-100 %] 96 % (02/19 0700) FiO2 (%):  [98 %] 98 % (02/18 1600) Weight:  [89.7 kg (197 lb 12 oz)] 89.7 kg (197 lb 12 oz) (02/19 0400) Last BM Date: 11/27/16 3800 IV Urine 1220 NG 500 BM x 1 TM 99.1, BP elevated and on Nicardipine drip.  O2 saturations good on [place] and Room air Troponin 0.15 & 0.12 this AM WBC improving CT angio chest/abd/pelvis this AM:   Emphysema with interval development of small bilateral pleural effusions and bibasilar subsegmental atelectasis versus less likely infiltrate. 2. Cardiomegaly with biatrial dilatation and evidence of right cardiac dysfunction. Correlation with echocardiogram recommended. 3. Small ascites, diffuse subcutaneous soft tissue edema and anasarca. 4. 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 Evaluation of the vessels is very limited due to artifact. There is advanced aortoiliac atherosclerotic disease. 5. Mildly dilated fluid-filled loops of small bowel elbow likely related to ileus. Overall there has been interval improvement in the degree of bowel dilatation compared to the prior CT. Clinical correlation and follow-up recommended. Thickened appearing loops of small bowel likely related to under distention and ascites. Enteritis is less likely. 6. Postsurgical changes with focal small pockets of extraluminal air. No drainable fluid  collection/abscess. Intake/Output from previous day: 02/18 0701 - 02/19 0700 In: 3779.2 [I.V.:3494.2; NG/GT:30; IV Piggyback:255] Out: [id number] [Urine:1220; Emesis/NG output:500] Intake/Output this shift: No intake/output data recorded.  General appearance: alert, cooperative, no distress and still groans some even when not having real defined pain.  She denies pain now. Resp: clear to auscultation bilaterally and anterior, she does have small effusions bilat on CT scan this AM Cardio: regular rate, tachycardic, some PVC's/Pac's.   GI: soft, sore, some bowel sounds, incision OK, she is not distended and she has some bowel sounds, some flatus and 1 Bm with suppository this AM Extremities: trace edema   Lab Results:   Recent Labs  11/26/16 0444 11/27/16 0444  WBC 18.7* 16.4*  HGB 9.2* 9.2*  HCT 27.6* 27.4*  PLT 391 417*    BMET  Recent Labs  11/26/16 0444 11/27/16 0444  NA 136 135  K 3.9 3.5  CL 103 104  CO2 24 23  GLUCOSE 133* 142*  BUN 19 17  CREATININE 0.44 0.44  CALCIUM 8.1* 7.9*   PT/INR No results for input(s): LABPROT, INR in the last 72 hours.   Recent Labs Lab 11/23/16 0500 11/25/16 0518 11/27/16 0444  AST 49* 121* 46*  ALT 42 186* 107*  ALKPHOS 253* 236* 278*  BILITOT 1.2 1.4* 1.3*  PROT 6.4* 5.4* 6.1*  ALBUMIN 2.1* 1.6* 1.7*     Lipase     Component Value Date/Time   LIPASE 13 11/11/2016 0945     Studies/Results: Dg Abd 1 View  Result Date: 11/27/2016 CLINICAL DATA:  81 year old  female with abdominal pain EXAM: ABDOMEN - 1 VIEW COMPARISON:  abdominal radiograph dated 11/24/2016 FINDINGS: Partially visualized enteric tube in similar position as the prior radiograph and likely in the distal stomach. There is persistent mildly distended air-filled loop of small bowel in the left lower quadrant measuring up to 3.6 cm in diameter similar to the prior radiograph. Air is noted within the colon. No free air identified on the provided image. Right upper  quadrant cholecystectomy clips noted. There is degenerative changes of the spine with lower lumbar laminectomy and fusion hardware. There is sclerotic changes of sacroiliitis bilaterally. No acute fracture. Midline vertical anterior pelvic wall surgical staples. The soft tissues are grossly unremarkable. IMPRESSION: 1. Stable positioning of the enteric tube with tip in the distal stomach. 2. Stable mildly dilated air-filled loop of small bowel in the left lower quadrant measuring up to 3.6 cm in diameter. Follow-up recommended. Electronically Signed   By: Anner Crete M.D.   On: 11/27/2016 02:03   Ct Angio Chest/abd/pel For Dissection W And/or W/wo  Result Date: 11/27/2016 CLINICAL DATA:  81 year old female with chest and abdominal pain. EXAM: CT ANGIOGRAPHY CHEST, ABDOMEN AND PELVIS TECHNIQUE: Multidetector CT imaging through the chest, abdomen and pelvis was performed using the standard protocol during bolus administration of intravenous contrast. Multiplanar reconstructed images and MIPs were obtained and reviewed to evaluate the vascular anatomy. CONTRAST:  100 cc Isovue 370 COMPARISON:  Abdominal CT 11/23/2016 FINDINGS: CTA CHEST FINDINGS Cardiovascular: There is mild cardiomegaly with biatrial dilatation. Multi vessel coronary vascular disease as well as calcification of the mitral annulus noted. There is a retrograde flow of contrast from the right atrium into the IVC compatible with a degree of right cardiac dysfunction. No pericardial effusion. There is atherosclerotic calcification of the thoracic aorta and the origins of the great vessels of the aortic arch. There is common origin of the left common carotid artery and right brachiocephalic trunk. No aortic aneurysm or dissection. Evaluation of the pulmonary arteries is limited due to suboptimal opacification of the peripheral branches as well as respiratory motion artifact. No definite central pulmonary artery embolus identified. V/Q scan may  provide better evaluation if there is high clinical concern for pulmonary embolism. Mediastinum/Nodes: There is no hilar or mediastinal adenopathy. An enteric tube is noted within the esophagus. Lungs/Pleura: There is emphysematous changes of the lungs. There are small bilateral pleural effusions, new from prior CT of 11/20/2016. Subsegmental consolidative changes of the lung bases noted which may be related to compressive atelectasis or pneumonia. No pneumothorax. The central airways are patent. Musculoskeletal: Top-normal bilateral axillary lymph nodes of indeterminate etiology, possibly reactive. Clinical correlation is recommended. There is diffuse subcutaneous soft tissue edema and anasarca. No fluid collection. There is degenerative changes of the spine. No acute fracture. Review of the MIP images confirms the above findings. CTA ABDOMEN AND PELVIS FINDINGS VASCULAR Evaluation is limited due to respiratory motion artifact as well as streak artifact caused by metallic spinal hardware. Aorta: Advanced aortoiliac atherosclerotic disease. No aneurysmal dilatation or evidence of dissection Celiac: There is atherosclerotic calcification of the origin of the celiac axis with high-grade inclusion of the vessel. Only a small amount of contrast noted in the proximal portion of the celiac axis. SMA: Advanced atherosclerotic calcification of the origin of the SMA with diminished flow within the vessel. Renals: There is atherosclerotic calcification of the ostia of the renal arteries. The renal arteries however remain patent. IMA: There is atherosclerotic calcification of the origin of the IMA.  The IMA remain patent. Inflow: The iliac arteries remain patent. No aneurysmal dilatation for dissection. Veins: No obvious venous abnormality within the limitations of this arterial phase study. Review of the MIP images confirms the above findings. NON-VASCULAR Evaluation of the structures is very limited due to anasarca and  artifact caused by metallic spinal hardware. Small pocket of extraluminal air in the anterior pelvis (series 10 image 144) is likely related to recent surgery. Small pocket of air noted along the midline vertical surgical incision. There is a small ascites and diffuse mesenteric edema. Hepatobiliary: Cholecystectomy. The liver is grossly unremarkable with limited evaluation. Pancreas: There is limited visualization of the pancreas. The pancreas is grossly unremarkable. Spleen: Grossly unremarkable on limited evaluation. Adrenals/Urinary Tract: The left adrenal gland appears unremarkable. The right adrenal gland is not well visualized. There is no hydronephrosis on either side. The urinary bladder is partially decompressed around a Foley catheter. Stomach/Bowel: Evaluation of the bowel is very limited in the absence of oral contrast. An enteric feeding extends into distal stomach. Multiple top-normal caliber fluid-filled loops of small bowel throughout the abdomen may represent postsurgical ileus. Thickened appearance of loops of small bowel in the lower abdomen and pelvis likely related to underdistention and ascites. Enteritis is not excluded. Lymphatic: No adenopathy. Reproductive: The uterus is not well visualized and limited pain evaluation. The uterus appears heterogeneous and probably contains a fibroid. Other: There is diffuse subcutaneous edema and anasarca. Midline vertical anterior pelvic wall incisional scar noted. Small amount of fluid noted along the surgical incision. A small pockets of air is also seen in the subcutaneous soft tissues of the midline anterior abdomen along the surgical incision. Musculoskeletal: Multilevel degenerative changes of the spine. Lower lumbar laminectomy and posterior fusion hardware. No acute fracture. Review of the MIP images confirms the above findings. IMPRESSION: 1. Emphysema with interval development of small bilateral pleural effusions and bibasilar subsegmental  atelectasis versus less likely infiltrate. 2. Cardiomegaly with biatrial dilatation and evidence of right cardiac dysfunction. Correlation with echocardiogram recommended. 3. Small ascites, diffuse subcutaneous soft tissue edema and anasarca. 4. 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 Evaluation of the vessels is very limited due to artifact. There is advanced aortoiliac atherosclerotic disease. 5. Mildly dilated fluid-filled loops of small bowel elbow likely related to ileus. Overall there has been interval improvement in the degree of bowel dilatation compared to the prior CT. Clinical correlation and follow-up recommended. Thickened appearing loops of small bowel likely related to under distention and ascites. Enteritis is less likely. 6. Postsurgical changes with focal small pockets of extraluminal air. No drainable fluid collection/abscess. Electronically Signed   By: Anner Crete M.D.   On: 11/27/2016 05:35    Medications: . bisacodyl  10 mg Rectal Daily  . [START ON 12/02/2016] cloNIDine  0.2 mg Transdermal Weekly  . cloNIDine  0.1 mg Per Tube Once  . enoxaparin (LOVENOX) injection  40 mg Subcutaneous Q24H  . HYDROmorphone   Intravenous Q4H  . insulin aspart  0-9 Units Subcutaneous Q8H  . iopamidol      . lip balm  1 application Topical BID  . methocarbamol (ROBAXIN)  IV  1,000 mg Intravenous Q8H  . metoprolol  10 mg Intravenous Q6H  . nitroGLYCERIN  1 inch Topical Q6H  . pantoprazole (PROTONIX) IV  80 mg Intravenous Q12H  . sodium chloride       . sodium chloride 50 mL/hr at 11/27/16 0700  .  niCARDipine 5 mg/hr (11/27/16 0734)  . Marland KitchenTPN (CLINIMIX-E) Adult 75 mL/hr at 11/27/16 0700   Assessment/Plan Admitted 1/20 - Medicine S/p Laparoscopic Cholecystectomy with intraoperative cholangiogram, 10/30/16, Dr. Rodman Key Martin;Necrotic gallbladder with free intrapertioneal bile; enlarged CBD with free flow into the duodenum POD 28 Exploratory  laparotomy with LOA - findings: Mechanical obstruction, 11/17/16, Dr. Dalbert Batman POD 10 Exploratory laparotomy, LOA, reduction of SB volvulus 2/16- Dr. 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  Echo EF 0000000 1 diastolic dysfunction/Trivial MR- continue prn nitro Anemia  Stable post transfusion. Severe protein calorie malnutrition - prealbumin 8.3 - on TNA.    Recheck prealbumin tomorrow. HTN - lopressor, prn hydralazine, nicardipine drip COPD- IS GERD-protonix History infected knee after TKA. History MRSA - On isolation  FEN: IV fluids/NPO ID: [id number] 1/21- 11/16/16, on again 2/15-2/17.     No abscess at time of surgery.  No intraabdominal contamination. Currently no antibitotics DVT prophylaxis: Lovenox    Plan:  Ice chips, leave foley in as long as she is on IV BP rx.  IV tylenol to assist with pain, without narcotic use.    Ger her OOB to chair.  Watch her NG and if not much aim to clamp and see if she can do without it tomorrow.  PT and OT once we get her less encumbered.    Appreciate Medicine input.  They may start to add some diuresis to her and see if we can start getting this extra fluid off.  Wt 76.4 kg 11/20/16.  This AM  89.7 Kg.  I/O 28 liter positive.  LOS: 30 days   [name],[name] 11/27/2016 [phone]  Agree with above. She's sitting in a chair. A friend (who said she was raised by her), April, in room with the patient.  She said that she had a small BM after a suppository.  She has passed flatus. Looks better than I expected.  He urine is a rusty color.  Will try the foley out.  Alphonsa Overall, MD, [facility] Surgery Pager: [phone] Office phone:  [phone]

## 2016-11-27 NOTE — Care Management Note (Signed)
Case Management Note  Patient Details  Name: Melanie [name] MRN: [id number] Date of Birth: 06-02-1936  Subjective/Objective:       Patient has had 3 major abd surg since admit.  Currently with ileus, temp and elevated WBC and on Iv Cardene Drip.             Action/Plan:Date:  November 27, 2016 Chart reviewed for concurrent status and case management needs. Will continue to follow patient progress. Discharge Planning: following for needs Expected discharge date: [id number] Velva Harman, BSN, [place], [place]   Expected Discharge Date:                  Expected Discharge Plan:  Home/Self Care  In-House Referral:     Discharge planning Services  CM Consult  Post Acute Care Choice:    Choice offered to:     DME Arranged:    DME Agency:     HH Arranged:    HH Agency:     Status of Service:  In process, will continue to follow  If discussed at Long Length of Stay Meetings, dates discussed:    Additional Comments:  Leeroy Cha, RN 11/27/2016, 10:12 AM

## 2016-11-28 DIAGNOSIS — L8990 Pressure ulcer of unspecified site, unspecified stage: Secondary | ICD-10-CM | POA: Insufficient documentation

## 2016-11-28 LAB — TYPE AND SCREEN
ABO/RH(D): B POS
ANTIBODY SCREEN: NEGATIVE
UNIT DIVISION: 0
UNIT DIVISION: 0
Unit division: 0
Unit division: 0

## 2016-11-28 LAB — GLUCOSE, CAPILLARY
GLUCOSE-CAPILLARY: 132 mg/dL — AB (ref 65–99)
GLUCOSE-CAPILLARY: 104 mg/dL — AB (ref 65–99)
Glucose-Capillary: 138 mg/dL — ABNORMAL HIGH (ref 65–99)

## 2016-11-28 LAB — COMPREHENSIVE METABOLIC PANEL
ALBUMIN: 1.7 g/dL — AB (ref 3.5–5.0)
ALT: 80 U/L — ABNORMAL HIGH (ref 14–54)
AST: 31 U/L (ref 15–41)
Alkaline Phosphatase: 255 U/L — ABNORMAL HIGH (ref 38–126)
Anion gap: 6 (ref 5–15)
BILIRUBIN TOTAL: 1.3 mg/dL — AB (ref 0.3–1.2)
BUN: 17 mg/dL (ref 6–20)
CO2: 22 mmol/L (ref 22–32)
Calcium: 8 mg/dL — ABNORMAL LOW (ref 8.9–10.3)
Chloride: 107 mmol/L (ref 101–111)
Creatinine, Ser: 0.42 mg/dL — ABNORMAL LOW (ref 0.44–1.00)
GFR calc Af Amer: >60 mL/min (ref >60)
GFR calc non Af Amer: >60 mL/min (ref >60)
GLUCOSE: 135 mg/dL — AB (ref 65–99)
POTASSIUM: 3.4 mmol/L — AB (ref 3.5–5.1)
SODIUM: 135 mmol/L (ref 135–145)
Total Protein: 6.1 g/dL — ABNORMAL LOW (ref 6.5–8.1)

## 2016-11-28 LAB — CBC
HEMATOCRIT: 26.6 % — AB (ref 36.0–46.0)
Hemoglobin: 9.1 g/dL — ABNORMAL LOW (ref 12.0–15.0)
MCH: 27.3 pg (ref 26.0–34.0)
MCHC: 34.2 g/dL (ref 30.0–36.0)
MCV: 79.9 fL (ref 78.0–100.0)
Platelets: 409 10*3/uL — ABNORMAL HIGH (ref 150–400)
RBC: 3.33 MIL/uL — ABNORMAL LOW (ref 3.87–5.11)
RDW: 17.7 % — AB (ref 11.5–15.5)
WBC: 18.9 10*3/uL — AB (ref 4.0–10.5)

## 2016-11-28 LAB — MAGNESIUM: Magnesium: 1.8 mg/dL (ref 1.7–2.4)

## 2016-11-28 LAB — MRSA PCR SCREENING: MRSA by PCR: POSITIVE — AB

## 2016-11-28 MED ORDER — FAT EMULSION 20 % IV EMUL
250.0000 mL | INTRAVENOUS | Status: AC
  Administered 2016-11-28: 250 mL via INTRAVENOUS
  Filled 2016-11-28 (×2): qty 250

## 2016-11-28 MED ORDER — TRACE MINERALS CR-CU-MN-SE-ZN 10-1000-500-60 MCG/ML IV SOLN
INTRAVENOUS | Status: AC
  Administered 2016-11-28: 17:00:00 via INTRAVENOUS
  Filled 2016-11-28 (×2): qty 1800

## 2016-11-28 MED ORDER — SODIUM CHLORIDE 0.9 % IV SOLN
30.0000 meq | Freq: Once | INTRAVENOUS | Status: AC
  Administered 2016-11-28: 30 meq via INTRAVENOUS
  Filled 2016-11-28: qty 15

## 2016-11-28 NOTE — Progress Notes (Signed)
PROGRESS NOTE    Melanie [name]  [id number] DOB: [birth year] DOA: 10/28/2016 PCP: Osborne Casco, MD     Brief Narrative:  81 yo female with prolonged hospitalization, admitted on 01/20 for necrotic cholecystitis. Developed post op bowel obstruction, re-intervened on 02/09 with exploratory laparotomy. NG tube in place, on TPN. Consulted for chest pain that ruled out for ACS. Patient has been in persistent pain, on PCA pump. Patient had third surgical intervention, emergent laparatomy, recurrent bowel obstruction with volvulus with ischemic segment. Resumed clonidine for blood pressure control. Worsening hypertension, started on nicardipine drip.  Clonidine dose increase to 0.2 mg, patient now off nicardipine.     Assessment & Plan:   Principal Problem:   Small bowel obstruction s/p ex lap & lysis of adhesions 11/17/2016 Active Problems:   Necrotic cholecystitis s/p lap cholecystectomy 10/30/2016   Essential hypertension   Hyperlipidemia   COPD (chronic obstructive pulmonary disease) (HCC)   GERD (gastroesophageal reflux disease)   Chest pain   H/O exploratory laparotomy   Anxiousness     1. Necrotic cholecystitis. Per surgery team, patient continue NPO. NG tube in place to low intermittent suction. Supportive post operative care per surgery.  2. Post operative bowel obstruction complicated with volvulus and ischemic bowel and sepsis features. Sp exploratory laparotomy, continueIV fluids, IV antibiotics with Zosyn. WBC up to 18.  Pain with improved controlled.   4. HTN uncontrolled. Patient off nicardipine infusion,  Continue hydralazine, metoprolol and clonidine patch. Continue pain control.   5. COPD. Aspiration precautions, oxymetry monitoring. No signs of exacerbation.   6. Iron deficiency anemia.  No signs of bleeding. Hb stale at 9.    DVT prophylaxis:enoxaparin  Code Status:full Family Communication:No family at the bedside.  Disposition Plan:Home/  SNF   Consultants:    Procedures: 02/16 Exploratory laparotomy with volvulus and ischemic bowel.   02/09 Exploratory laparotomy, lysis of adhesions  01/22 Laparoscopic Cholecystectomy with intraoperative cholangiogram.    Antimicrobials:   Zosyn   Subjective: Patient complains of abdominal pain, moderate in intensity, no nausea or vomiting, still NPO, no dyspnea or chest pain. Patient off nicardipine drip.   Objective: Vitals:   11/28/16 0400 11/28/16 0500 11/28/16 0600 11/28/16 0700  BP: (!) 158/65 (!) 171/56 (!) 171/71 (!) 165/57  Pulse: 92 89 94 84  Resp: 19 16 20 18   Temp:      TempSrc:      SpO2: 97% 97% 98% 98%  Weight:      Height:        Intake/Output Summary (Last 24 hours) at 11/28/16 0803 Last data filed at 11/28/16 [id number]  Gross per 24 hour  Intake          3869.83 ml  Output             1225 ml  Net          2644.83 ml   Filed Weights   11/08/16 1347 11/20/16 0946 11/27/16 0400  Weight: 74.3 kg (163 lb 11.2 oz) 76.4 kg (168 lb 6.9 oz) 89.7 kg (197 lb 12 oz)    Examination:  General exam: deconditioned E ENT: mild pallor, oral mucosa dry, NG tube in place.   Respiratory system: Decreased breath sounds at bases due to poor inspiratory effort. Respiratory effort normal. Cardiovascular system: S1 & S2 heard, RRR. No JVD, murmurs, rubs, gallops or clicks. No pedal edema. Gastrointestinal system: Abdomen is mildly distended, soft but tender to superficial palpation. No organomegaly or masses felt. Normal bowel sounds  heard. Central nervous system: Alert and oriented. No focal neurological deficits. Extremities: Symmetric 5 x 5 power. Skin: No rashes, lesions or ulcers  Data Reviewed: I have personally reviewed following labs and imaging studies  CBC:  Recent Labs Lab 11/24/16 0557 11/25/16 0849 11/26/16 0444 11/27/16 0444 11/28/16 0344  WBC 24.4* 23.0* 18.7* 16.4* 18.9*  NEUTROABS  --   --   --  13.2*  --     HGB 6.7* 9.1* 9.2* 9.2* 9.1*  HCT 20.3* 27.1* 27.6* 27.4* 26.6*  MCV 79.0 79.9 81.4 80.8 79.9  PLT 408* 370 391 417* [id number]*   Basic Metabolic Panel:  Recent Labs Lab 11/23/16 0500 11/24/16 0557 11/25/16 0518 11/26/16 0444 11/27/16 0444 11/28/16 0344  NA 136 135 136 136 135 135  K 4.1 3.9 3.5 3.9 3.5 3.4*  CL 104 103 103 103 104 107  CO2 25 25 28 24 23 22   GLUCOSE 133* 131* 123* 133* 142* 135*  BUN 23* 20 19 19 17 17   CREATININE 0.53 0.59 0.47 0.44 0.44 0.42*  CALCIUM 8.4* 8.0* 7.9* 8.1* 7.9* 8.0*  MG 1.7  --  1.5* 1.8 1.7 1.8  PHOS 4.0  --  3.2  --  3.3  --    GFR: Estimated Creatinine Clearance: 60.3 mL/min (by C-G formula based on SCr of 0.42 mg/dL (L)). Liver Function Tests:  Recent Labs Lab 11/23/16 0500 11/25/16 0518 11/27/16 0444 11/28/16 0344  AST 49* 121* 46* 31  ALT 42 186* 107* 80*  ALKPHOS 253* 236* 278* 255*  BILITOT 1.2 1.4* 1.3* 1.3*  PROT 6.4* 5.4* 6.1* 6.1*  ALBUMIN 2.1* 1.6* 1.7* 1.7*   No results for input(s): LIPASE, AMYLASE in the last 168 hours. No results for input(s): AMMONIA in the last 168 hours. Coagulation Profile: No results for input(s): INR, PROTIME in the last 168 hours. Cardiac Enzymes:  Recent Labs Lab 11/21/16 1406 11/27/16 0032 11/27/16 0611 11/27/16 1209  TROPONINI 0.05* 0.15* 0.12* 0.11*   BNP (last 3 results) No results for input(s): PROBNP in the last 8760 hours. HbA1C: No results for input(s): HGBA1C in the last 72 hours. CBG:  Recent Labs Lab 11/26/16 2150 11/27/16 0612 11/27/16 1438 11/27/16 2138 11/28/16 0604  GLUCAP 125* 157* 138* 130* 138*   Lipid Profile:  Recent Labs  11/27/16 0444  TRIG 111   Thyroid Function Tests: No results for input(s): TSH, T4TOTAL, FREET4, T3FREE, THYROIDAB in the last 72 hours. Anemia Panel: No results for input(s): VITAMINB12, FOLATE, FERRITIN, TIBC, IRON, RETICCTPCT in the last 72 hours. Sepsis Labs:  Recent Labs Lab 11/24/16 1522 11/25/16 0518  11/26/16 0444  PROCALCITON 0.56 0.65 3.51    Recent Results (from the past 240 hour(s))  Urine culture     Status: None   Collection Time: 11/20/16  8:51 AM  Result Value Ref Range Status   Specimen Description URINE, CATHETERIZED  Final   Special Requests NONE  Final   Culture   Final    NO GROWTH Performed at [facility], [place] [address], [place], [place] [address]    Report Status 11/21/2016 FINAL  Final  MRSA PCR Screening     Status: None   Collection Time: 11/20/16  3:50 PM  Result Value Ref Range Status   MRSA by PCR NEGATIVE NEGATIVE Final    Comment:        The GeneXpert MRSA Assay (FDA approved for NASAL specimens only), is one component of a comprehensive MRSA colonization surveillance program. It is not  intended to diagnose MRSA infection nor to guide or monitor treatment for MRSA infections.   Culture, blood (Routine X 2) w Reflex to ID Panel     Status: None (Preliminary result)   Collection Time: 11/24/16  1:45 PM  Result Value Ref Range Status   Specimen Description BLOOD LEFT HAND  Final   Special Requests BOTTLES DRAWN AEROBIC AND ANAEROBIC 5CC EACH  Final   Culture   Final    NO GROWTH 3 DAYS Performed at [facility], [place] [address], [place], [place] [address]    Report Status PENDING  Incomplete  Culture, blood (Routine X 2) w Reflex to ID Panel     Status: None (Preliminary result)   Collection Time: 11/24/16  1:50 PM  Result Value Ref Range Status   Specimen Description BLOOD LEFT HAND  Final   Special Requests IN PEDIATRIC BOTTLE .[place]  Final   Culture   Final    NO GROWTH 3 DAYS Performed at [facility], [place] [address] [place], [place] [address]    Report Status PENDING  Incomplete         Radiology Studies: Dg Abd 1 View  Result Date: 11/27/2016 CLINICAL DATA:  81 year old female with abdominal pain EXAM: ABDOMEN - 1 VIEW COMPARISON:  abdominal radiograph dated 11/24/2016 FINDINGS: Partially visualized enteric  tube in similar position as the prior radiograph and likely in the distal stomach. There is persistent mildly distended air-filled loop of small bowel in the left lower quadrant measuring up to 3.6 cm in diameter similar to the prior radiograph. Air is noted within the colon. No free air identified on the provided image. Right upper quadrant cholecystectomy clips noted. There is degenerative changes of the spine with lower lumbar laminectomy and fusion hardware. There is sclerotic changes of sacroiliitis bilaterally. No acute fracture. Midline vertical anterior pelvic wall surgical staples. The soft tissues are grossly unremarkable. IMPRESSION: 1. Stable positioning of the enteric tube with tip in the distal stomach. 2. Stable mildly dilated air-filled loop of small bowel in the left lower quadrant measuring up to 3.6 cm in diameter. Follow-up recommended. Electronically Signed   By: Anner Crete M.D.   On: 11/27/2016 02:03   Ct Angio Chest/abd/pel For Dissection W And/or W/wo  Result Date: 11/27/2016 CLINICAL DATA:  81 year old female with chest and abdominal pain. EXAM: CT ANGIOGRAPHY CHEST, ABDOMEN AND PELVIS TECHNIQUE: Multidetector CT imaging through the chest, abdomen and pelvis was performed using the standard protocol during bolus administration of intravenous contrast. Multiplanar reconstructed images and MIPs were obtained and reviewed to evaluate the vascular anatomy. CONTRAST:  100 cc Isovue 370 COMPARISON:  Abdominal CT 11/23/2016 FINDINGS: CTA CHEST FINDINGS Cardiovascular: There is mild cardiomegaly with biatrial dilatation. Multi vessel coronary vascular disease as well as calcification of the mitral annulus noted. There is a retrograde flow of contrast from the right atrium into the IVC compatible with a degree of right cardiac dysfunction. No pericardial effusion. There is atherosclerotic calcification of the thoracic aorta and the origins of the great vessels of the aortic arch. There is  common origin of the left common carotid artery and right brachiocephalic trunk. No aortic aneurysm or dissection. Evaluation of the pulmonary arteries is limited due to suboptimal opacification of the peripheral branches as well as respiratory motion artifact. No definite central pulmonary artery embolus identified. V/Q scan may provide better evaluation if there is high clinical concern for pulmonary embolism. Mediastinum/Nodes: There is no hilar or mediastinal adenopathy.  An enteric tube is noted within the esophagus. Lungs/Pleura: There is emphysematous changes of the lungs. There are small bilateral pleural effusions, new from prior CT of 11/20/2016. Subsegmental consolidative changes of the lung bases noted which may be related to compressive atelectasis or pneumonia. No pneumothorax. The central airways are patent. Musculoskeletal: Top-normal bilateral axillary lymph nodes of indeterminate etiology, possibly reactive. Clinical correlation is recommended. There is diffuse subcutaneous soft tissue edema and anasarca. No fluid collection. There is degenerative changes of the spine. No acute fracture. Review of the MIP images confirms the above findings. CTA ABDOMEN AND PELVIS FINDINGS VASCULAR Evaluation is limited due to respiratory motion artifact as well as streak artifact caused by metallic spinal hardware. Aorta: Advanced aortoiliac atherosclerotic disease. No aneurysmal dilatation or evidence of dissection Celiac: There is atherosclerotic calcification of the origin of the celiac axis with high-grade inclusion of the vessel. Only a small amount of contrast noted in the proximal portion of the celiac axis. SMA: Advanced atherosclerotic calcification of the origin of the SMA with diminished flow within the vessel. Renals: There is atherosclerotic calcification of the ostia of the renal arteries. The renal arteries however remain patent. IMA: There is atherosclerotic calcification of the origin of the IMA.  The IMA remain patent. Inflow: The iliac arteries remain patent. No aneurysmal dilatation for dissection. Veins: No obvious venous abnormality within the limitations of this arterial phase study. Review of the MIP images confirms the above findings. NON-VASCULAR Evaluation of the structures is very limited due to anasarca and artifact caused by metallic spinal hardware. Small pocket of extraluminal air in the anterior pelvis (series 10 image 144) is likely related to recent surgery. Small pocket of air noted along the midline vertical surgical incision. There is a small ascites and diffuse mesenteric edema. Hepatobiliary: Cholecystectomy. The liver is grossly unremarkable with limited evaluation. Pancreas: There is limited visualization of the pancreas. The pancreas is grossly unremarkable. Spleen: Grossly unremarkable on limited evaluation. Adrenals/Urinary Tract: The left adrenal gland appears unremarkable. The right adrenal gland is not well visualized. There is no hydronephrosis on either side. The urinary bladder is partially decompressed around a Foley catheter. Stomach/Bowel: Evaluation of the bowel is very limited in the absence of oral contrast. An enteric feeding extends into distal stomach. Multiple top-normal caliber fluid-filled loops of small bowel throughout the abdomen may represent postsurgical ileus. Thickened appearance of loops of small bowel in the lower abdomen and pelvis likely related to underdistention and ascites. Enteritis is not excluded. Lymphatic: No adenopathy. Reproductive: The uterus is not well visualized and limited pain evaluation. The uterus appears heterogeneous and probably contains a fibroid. Other: There is diffuse subcutaneous edema and anasarca. Midline vertical anterior pelvic wall incisional scar noted. Small amount of fluid noted along the surgical incision. A small pockets of air is also seen in the subcutaneous soft tissues of the midline anterior abdomen along the  surgical incision. Musculoskeletal: Multilevel degenerative changes of the spine. Lower lumbar laminectomy and posterior fusion hardware. No acute fracture. Review of the MIP images confirms the above findings. IMPRESSION: 1. Emphysema with interval development of small bilateral pleural effusions and bibasilar subsegmental atelectasis versus less likely infiltrate. 2. Cardiomegaly with biatrial dilatation and evidence of right cardiac dysfunction. Correlation with echocardiogram recommended. 3. Small ascites, diffuse subcutaneous soft tissue edema and anasarca. 4. 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 Evaluation of the vessels is very limited due to artifact. There is advanced aortoiliac  atherosclerotic disease. 5. Mildly dilated fluid-filled loops of small bowel elbow likely related to ileus. Overall there has been interval improvement in the degree of bowel dilatation compared to the prior CT. Clinical correlation and follow-up recommended. Thickened appearing loops of small bowel likely related to under distention and ascites. Enteritis is less likely. 6. Postsurgical changes with focal small pockets of extraluminal air. No drainable fluid collection/abscess. Electronically Signed   By: Anner Crete M.D.   On: 11/27/2016 05:35        Scheduled Meds: . bisacodyl  10 mg Rectal Daily  . cloNIDine  0.2 mg Transdermal Weekly  . cloNIDine  0.1 mg Per Tube Once  . enoxaparin (LOVENOX) injection  40 mg Subcutaneous Q24H  . hydrALAZINE  20 mg Intravenous Q6H  . HYDROmorphone   Intravenous Q4H  . insulin aspart  0-9 Units Subcutaneous Q8H  . lip balm  1 application Topical BID  . methocarbamol (ROBAXIN)  IV  1,000 mg Intravenous Q8H  . metoprolol  10 mg Intravenous Q6H  . nitroGLYCERIN  1 inch Topical Q6H  . pantoprazole (PROTONIX) IV  40 mg Intravenous Q12H  . potassium chloride (KCL MULTIRUN) 30 mEq in 265 mL IVPB  30 mEq Intravenous Once    Continuous Infusions: . sodium chloride 50 mL/hr at 11/28/16 0600  . Marland KitchenTPN (CLINIMIX-E) Adult     And  . fat emulsion    . niCARDipine Stopped (11/27/16 1311)  . Marland KitchenTPN (CLINIMIX-E) Adult 75 mL/hr at 11/28/16 0600     LOS: 31 days        Zachory Mangual Gerome Apley, MD [facility] Pager [phone]  If 7PM-7AM, please contact night-coverage [email or web address] Password [email or web address] 11/28/2016, 8:03 AM

## 2016-11-28 NOTE — Progress Notes (Signed)
4 Days Post-Op  Subjective: Doing okay.  Had small BM.  Still with pain but seems better controlled.  Objective: Vital signs in last 24 hours: Temp:  [97.1 F (36.2 C)-98.2 F (36.8 C)] 97.8 F (36.6 C) (02/20 0719) Pulse Rate:  [75-112] 84 (02/20 0700) Resp:  [14-22] 17 (02/20 0800) BP: (151-191)/(44-142) 165/57 (02/20 0700) SpO2:  [91 %-100 %] 100 % (02/20 0800) Last BM Date: 11/28/16  Urine 600 last 24 hours NG 500 BM x 1  Intake/Output from previous day: 02/19 0701 - 02/20 0700 In: 4171.9 [I.V.:3246.9; NG/GT:30; IV Piggyback:895] Out: [id number] [Urine:600; Emesis/NG output:625] Intake/Output this shift: Total I/O In: 390 [I.V.:125; IV Piggyback:265] Out: 50 [Emesis/NG output:50]  General appearance: alert, cooperative, has NGT (I removed) Resp: clear to auscultation bilaterally and anterior Cardio: regular rate, tachycardic, some PVC's/Pac's.   GI: soft, sore, some bowel sounds, incision OK (dressing intact - to leave one more day), she is not distended and she  Extremities: trace edema   Lab Results:   Recent Labs  11/27/16 0444 11/28/16 0344  WBC 16.4* 18.9*  HGB 9.2* 9.1*  HCT 27.4* 26.6*  PLT 417* 409*    BMET  Recent Labs  11/27/16 0444 11/28/16 0344  NA 135 135  K 3.5 3.4*  CL 104 107  CO2 23 22  GLUCOSE 142* 135*  BUN 17 17  CREATININE 0.44 0.42*  CALCIUM 7.9* 8.0*   PT/INR No results for input(s): LABPROT, INR in the last 72 hours.   Recent Labs Lab 11/23/16 0500 11/25/16 0518 11/27/16 0444 11/28/16 0344  AST 49* 121* 46* 31  ALT 42 186* 107* 80*  ALKPHOS 253* 236* 278* 255*  BILITOT 1.2 1.4* 1.3* 1.3*  PROT 6.4* 5.4* 6.1* 6.1*  ALBUMIN 2.1* 1.6* 1.7* 1.7*     Lipase     Component Value Date/Time   LIPASE 13 11/11/2016 0945     Studies/Results: Dg Abd 1 View  Result Date: 11/27/2016 CLINICAL DATA:  81 year old female with abdominal pain EXAM: ABDOMEN - 1 VIEW COMPARISON:  abdominal radiograph dated 11/24/2016 FINDINGS:  Partially visualized enteric tube in similar position as the prior radiograph and likely in the distal stomach. There is persistent mildly distended air-filled loop of small bowel in the left lower quadrant measuring up to 3.6 cm in diameter similar to the prior radiograph. Air is noted within the colon. No free air identified on the provided image. Right upper quadrant cholecystectomy clips noted. There is degenerative changes of the spine with lower lumbar laminectomy and fusion hardware. There is sclerotic changes of sacroiliitis bilaterally. No acute fracture. Midline vertical anterior pelvic wall surgical staples. The soft tissues are grossly unremarkable. IMPRESSION: 1. Stable positioning of the enteric tube with tip in the distal stomach. 2. Stable mildly dilated air-filled loop of small bowel in the left lower quadrant measuring up to 3.6 cm in diameter. Follow-up recommended. Electronically Signed   By: Anner Crete M.D.   On: 11/27/2016 02:03   Ct Angio Chest/abd/pel For Dissection W And/or W/wo  Result Date: 11/27/2016 CLINICAL DATA:  81 year old female with chest and abdominal pain. EXAM: CT ANGIOGRAPHY CHEST, ABDOMEN AND PELVIS TECHNIQUE: Multidetector CT imaging through the chest, abdomen and pelvis was performed using the standard protocol during bolus administration of intravenous contrast. Multiplanar reconstructed images and MIPs were obtained and reviewed to evaluate the vascular anatomy. CONTRAST:  100 cc Isovue 370 COMPARISON:  Abdominal CT 11/23/2016 FINDINGS: CTA CHEST FINDINGS Cardiovascular: There is mild cardiomegaly with biatrial dilatation. Multi  vessel coronary vascular disease as well as calcification of the mitral annulus noted. There is a retrograde flow of contrast from the right atrium into the IVC compatible with a degree of right cardiac dysfunction. No pericardial effusion. There is atherosclerotic calcification of the thoracic aorta and the origins of the great vessels  of the aortic arch. There is common origin of the left common carotid artery and right brachiocephalic trunk. No aortic aneurysm or dissection. Evaluation of the pulmonary arteries is limited due to suboptimal opacification of the peripheral branches as well as respiratory motion artifact. No definite central pulmonary artery embolus identified. V/Q scan may provide better evaluation if there is high clinical concern for pulmonary embolism. Mediastinum/Nodes: There is no hilar or mediastinal adenopathy. An enteric tube is noted within the esophagus. Lungs/Pleura: There is emphysematous changes of the lungs. There are small bilateral pleural effusions, new from prior CT of 11/20/2016. Subsegmental consolidative changes of the lung bases noted which may be related to compressive atelectasis or pneumonia. No pneumothorax. The central airways are patent. Musculoskeletal: Top-normal bilateral axillary lymph nodes of indeterminate etiology, possibly reactive. Clinical correlation is recommended. There is diffuse subcutaneous soft tissue edema and anasarca. No fluid collection. There is degenerative changes of the spine. No acute fracture. Review of the MIP images confirms the above findings. CTA ABDOMEN AND PELVIS FINDINGS VASCULAR Evaluation is limited due to respiratory motion artifact as well as streak artifact caused by metallic spinal hardware. Aorta: Advanced aortoiliac atherosclerotic disease. No aneurysmal dilatation or evidence of dissection Celiac: There is atherosclerotic calcification of the origin of the celiac axis with high-grade inclusion of the vessel. Only a small amount of contrast noted in the proximal portion of the celiac axis. SMA: Advanced atherosclerotic calcification of the origin of the SMA with diminished flow within the vessel. Renals: There is atherosclerotic calcification of the ostia of the renal arteries. The renal arteries however remain patent. IMA: There is atherosclerotic calcification  of the origin of the IMA. The IMA remain patent. Inflow: The iliac arteries remain patent. No aneurysmal dilatation for dissection. Veins: No obvious venous abnormality within the limitations of this arterial phase study. Review of the MIP images confirms the above findings. NON-VASCULAR Evaluation of the structures is very limited due to anasarca and artifact caused by metallic spinal hardware. Small pocket of extraluminal air in the anterior pelvis (series 10 image 144) is likely related to recent surgery. Small pocket of air noted along the midline vertical surgical incision. There is a small ascites and diffuse mesenteric edema. Hepatobiliary: Cholecystectomy. The liver is grossly unremarkable with limited evaluation. Pancreas: There is limited visualization of the pancreas. The pancreas is grossly unremarkable. Spleen: Grossly unremarkable on limited evaluation. Adrenals/Urinary Tract: The left adrenal gland appears unremarkable. The right adrenal gland is not well visualized. There is no hydronephrosis on either side. The urinary bladder is partially decompressed around a Foley catheter. Stomach/Bowel: Evaluation of the bowel is very limited in the absence of oral contrast. An enteric feeding extends into distal stomach. Multiple top-normal caliber fluid-filled loops of small bowel throughout the abdomen may represent postsurgical ileus. Thickened appearance of loops of small bowel in the lower abdomen and pelvis likely related to underdistention and ascites. Enteritis is not excluded. Lymphatic: No adenopathy. Reproductive: The uterus is not well visualized and limited pain evaluation. The uterus appears heterogeneous and probably contains a fibroid. Other: There is diffuse subcutaneous edema and anasarca. Midline vertical anterior pelvic wall incisional scar noted. Small amount of fluid noted  along the surgical incision. A small pockets of air is also seen in the subcutaneous soft tissues of the midline  anterior abdomen along the surgical incision. Musculoskeletal: Multilevel degenerative changes of the spine. Lower lumbar laminectomy and posterior fusion hardware. No acute fracture. Review of the MIP images confirms the above findings. IMPRESSION: 1. Emphysema with interval development of small bilateral pleural effusions and bibasilar subsegmental atelectasis versus less likely infiltrate. 2. Cardiomegaly with biatrial dilatation and evidence of right cardiac dysfunction. Correlation with echocardiogram recommended. 3. Small ascites, diffuse subcutaneous soft tissue edema and anasarca. 4. 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 Evaluation of the vessels is very limited due to artifact. There is advanced aortoiliac atherosclerotic disease. 5. Mildly dilated fluid-filled loops of small bowel elbow likely related to ileus. Overall there has been interval improvement in the degree of bowel dilatation compared to the prior CT. Clinical correlation and follow-up recommended. Thickened appearing loops of small bowel likely related to under distention and ascites. Enteritis is less likely. 6. Postsurgical changes with focal small pockets of extraluminal air. No drainable fluid collection/abscess. Electronically Signed   By: Anner Crete M.D.   On: 11/27/2016 05:35    Medications: . bisacodyl  10 mg Rectal Daily  . cloNIDine  0.2 mg Transdermal Weekly  . cloNIDine  0.1 mg Per Tube Once  . enoxaparin (LOVENOX) injection  40 mg Subcutaneous Q24H  . hydrALAZINE  20 mg Intravenous Q6H  . HYDROmorphone   Intravenous Q4H  . insulin aspart  0-9 Units Subcutaneous Q8H  . lip balm  1 application Topical BID  . methocarbamol (ROBAXIN)  IV  1,000 mg Intravenous Q8H  . metoprolol  10 mg Intravenous Q6H  . nitroGLYCERIN  1 inch Topical Q6H  . pantoprazole (PROTONIX) IV  40 mg Intravenous Q12H  . potassium chloride (KCL MULTIRUN) 30 mEq in 265 mL IVPB  30 mEq Intravenous  Once   . sodium chloride 50 mL/hr at 11/28/16 0600  . Marland KitchenTPN (CLINIMIX-E) Adult     And  . fat emulsion    . niCARDipine Stopped (11/27/16 1311)  . Marland KitchenTPN (CLINIMIX-E) Adult 75 mL/hr at 11/28/16 0800   Assessment/Plan S/p Laparoscopic Cholecystectomy with intraoperative cholangiogram, 10/30/16, Dr. Rodman Key Martin;Necrotic gallbladder with free intrapertioneal bile;  Exploratory laparotomy with LOA - findings: Mechanical obstruction, 11/17/16, Dr. Dalbert Batman  Exploratory laparotomy, LOA, reduction of SB volvulus 2/16- Dr. Zella Richer  WBC - 18,900 - 11/28/2016 (this is back up a little)  Uncontrolled pain -   CT 2/19 -4 AM: improved SB dilatation, post op changes,  High-grade stenosis of the celiac axis as well as stenosis of the origin of the SMA   Chest pain with elevated troponin - improved.    Echo EF 0000000 1 diastolic dysfunction/Trivial MR- continue prn nitro Anemia    Hgb - 9.1 - 11/28/2016. Severe protein calorie malnutrition - on TNA.   Prealbumin 6.0 -  11/27/2016  HTN - lopressor, prn hydralazine, nicardipine drip COPD- IS GERD-protonix History infected knee after TKA. History MRSA - On isolation  FEN: IV fluids/NPO ID: Zosyn 1/21- 11/16/16, on again 2/15-2/17.     No abscess at time of surgery.  No intraabdominal contamination. Currently no antibitotics  DVT prophylaxis: Lovenox    Plan:  NGT removed  Keep NPO except ice chips  Replace K+  Transfer to floor  PT involvement   LOS: 31 days    Alphonsa Overall, MD, [facility]  Pager: [phone] Office phone:  [phone]

## 2016-11-28 NOTE — Progress Notes (Addendum)
[facility] NOTE   Pharmacy Consult for TPN Indication: prolonged ileus  Patient Measurements: Height: 5\' 2"  (157.5 cm) Weight: 197 lb 12 oz (89.7 kg) IBW/kg (Calculated) : 50.1 TPN AdjBW (KG): 56.7 Body mass index is 36.17 kg/m.  Insulin Requirements: 5 units in the last 24hrs.  Current Nutrition: NPO (ice chips, sips with meds)  IVF: NS at 50 ml/hr  Central access: PICC TPN start date: 11/08/16  ASSESSMENT                                                                                                          HPI: Patient is [name] with hx of gallstones presented to the ED on 1/20 with c/o abdominal pain. MRCP on 1/22 showed distended gallbladder with tiny gallstones and wall thickening with suspicion for acute cholecystitis. Patient underwent lap cholecystectomy with intraoperative cholangiogram on 1/22.  To start TPN for prolonged ileus and poor oral intake.  Significant events:  1/22: lap chole with cholangiogram 1/30: abd KUB with suspicion for post-op ileus; NG tube placed  2/2: high NG output from 1/31-2/1 (1342ml) but decreased 2/1-2/2 (300 ml). RN reported foul smell from NG tube --CCS may d/c NG 2/3: trial clamping NG - successful 2/4 NGT removed. Switching from E 5/20 to E 5/15 to better align with nutritional goals 2/6 leave NGT out, continue NPO, GI consult 2/8 Xray consistent with ileus or SBO - possible ex lap tomorrow 2/9: Underwent exploratory laparotomy, lysis of adhesions.  2/16: back to OR for recurrent SBO.  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  Today 11/28/16:   Glucose mostly controlled (one value above goal <150). No hx of DM.  Electrolytes: K slightly low, all others WNL including corrected Ca  Renal: SCr/BUN ok; CrCl 60 ml/min, I/O (+) 3.0L/24hr  LFTs: AlkPhos elevated (TPN related?); ALT ~2x ULN, Tbili slightly elevated, albumin low  TGs: wnl, 69 (2/12), 111  (2/19)  Prealbumin: 5.9 (2/5), 8.3 (2/12), decreased to 6.0 (2/19)  NUTRITIONAL GOALS                                                                                             RD recs (2/15) Kcal:  1750-1950 Protein:  80-90g Fluid:  1.8L/day  Clinimix E 5/15 (switched from E 5/20 at 70 ml/hr due to availability) at a goal rate of 75 ml/hr + 20% fat emulsion at 20 ml/hr x 12 hr to provide:  90 g/day protein, 1758 Kcal/day.  (+++There is currently a Psychologist, prison and probation services of Clinimix solution. Pharmacy will use Clinimix product based on availability+++)  PLAN   Now: KCl 26meq IV x 1  At 1800 today:  Continue Clinimix E 5/15 at goal rate of 75 ml/hr.  20% fat emulsion at 20 ml/hr over 12 hrs  Trace elements are on national shortage, will be added as available. Add today.  Multivitamins will be added daily.  Continue sensitive SSI q8h.   TPN lab panels on Mondays & Thursdays.   Peggyann Juba, PharmD, BCPS Pager: [phone] 11/28/2016, 7:22 AM

## 2016-11-29 ENCOUNTER — Inpatient Hospital Stay (HOSPITAL_COMMUNITY): Payer: Medicare Other

## 2016-11-29 ENCOUNTER — Encounter: Payer: Self-pay | Admitting: Family

## 2016-11-29 DIAGNOSIS — E785 Hyperlipidemia, unspecified: Secondary | ICD-10-CM

## 2016-11-29 LAB — CBC
HCT: 26.9 % — ABNORMAL LOW (ref 36.0–46.0)
HEMOGLOBIN: 8.9 g/dL — AB (ref 12.0–15.0)
MCH: 26.6 pg (ref 26.0–34.0)
MCHC: 33.1 g/dL (ref 30.0–36.0)
MCV: 80.5 fL (ref 78.0–100.0)
PLATELETS: 403 10*3/uL — AB (ref 150–400)
RBC: 3.34 MIL/uL — ABNORMAL LOW (ref 3.87–5.11)
RDW: 18 % — ABNORMAL HIGH (ref 11.5–15.5)
WBC: 16.4 10*3/uL — ABNORMAL HIGH (ref 4.0–10.5)

## 2016-11-29 LAB — GLUCOSE, CAPILLARY
GLUCOSE-CAPILLARY: 125 mg/dL — AB (ref 65–99)
GLUCOSE-CAPILLARY: 110 mg/dL — AB (ref 65–99)
Glucose-Capillary: 118 mg/dL — ABNORMAL HIGH (ref 65–99)
Glucose-Capillary: 115 mg/dL — ABNORMAL HIGH (ref 65–99)

## 2016-11-29 LAB — CULTURE, BLOOD (ROUTINE X 2)
CULTURE: NO GROWTH
Culture: NO GROWTH

## 2016-11-29 MED ORDER — CHLORHEXIDINE GLUCONATE CLOTH 2 % EX PADS
6.0000 | MEDICATED_PAD | Freq: Every day | CUTANEOUS | Status: AC
  Administered 2016-11-30 – 2016-12-03 (×4): 6 via TOPICAL

## 2016-11-29 MED ORDER — NALOXONE HCL 0.4 MG/ML IJ SOLN: 0.4000 mg | INTRAMUSCULAR | Status: DC | PRN

## 2016-11-29 MED ORDER — DIPHENHYDRAMINE HCL 50 MG/ML IJ SOLN: 12.5000 mg | Freq: Four times a day (QID) | INTRAMUSCULAR | Status: DC | PRN

## 2016-11-29 MED ORDER — HYDROMORPHONE 1 MG/ML IV SOLN
INTRAVENOUS | Status: DC
  Administered 2016-11-29: 0.8 mg via INTRAVENOUS
  Administered 2016-11-29: 1 mg via INTRAVENOUS
  Administered 2016-11-29: 1.4 mg via INTRAVENOUS
  Administered 2016-11-30: 0.6 mg via INTRAVENOUS
  Administered 2016-11-30 (×2): 1.2 mg via INTRAVENOUS
  Administered 2016-11-30 (×2): 1 mg via INTRAVENOUS
  Administered 2016-12-01: 0.8 mg via INTRAVENOUS
  Administered 2016-12-01: 0.4 mg via INTRAVENOUS
  Administered 2016-12-01: 0.2 mg via INTRAVENOUS
  Administered 2016-12-01: 0.6 mg via INTRAVENOUS
  Administered 2016-12-01 (×2): 0.4 mg via INTRAVENOUS
  Administered 2016-12-02: 13:00:00 via INTRAVENOUS
  Administered 2016-12-02: 1 mL via INTRAVENOUS
  Administered 2016-12-02: 0.4 mg via INTRAVENOUS
  Administered 2016-12-02: 1 mg via INTRAVENOUS
  Administered 2016-12-02: 1.4 mg via INTRAVENOUS
  Administered 2016-12-02: 0.4 mg via INTRAVENOUS
  Administered 2016-12-02: 1.2 mL via INTRAVENOUS
  Administered 2016-12-02: 1.6 mg via INTRAVENOUS
  Administered 2016-12-03: 0.8 mg via INTRAVENOUS
  Administered 2016-12-03: 0.8 mL via INTRAVENOUS
  Administered 2016-12-03: 0.2 mg via INTRAVENOUS
  Administered 2016-12-03 – 2016-12-04 (×3): 0.8 mg via INTRAVENOUS
  Administered 2016-12-04: 1 mg via INTRAVENOUS
  Administered 2016-12-04: 0.2 mg via INTRAVENOUS
  Filled 2016-11-29: qty 25

## 2016-11-29 MED ORDER — PRO-STAT SUGAR FREE PO LIQD
30.0000 mL | Freq: Two times a day (BID) | ORAL | Status: DC
  Administered 2016-11-30 – 2016-12-09 (×17): 30 mL via ORAL
  Filled 2016-11-29 (×18): qty 30

## 2016-11-29 MED ORDER — DIPHENHYDRAMINE HCL 12.5 MG/5ML PO ELIX: 12.5000 mg | ORAL_SOLUTION | Freq: Four times a day (QID) | ORAL | Status: DC | PRN

## 2016-11-29 MED ORDER — FUROSEMIDE 20 MG PO TABS
20.0000 mg | ORAL_TABLET | Freq: Two times a day (BID) | ORAL | Status: DC
  Administered 2016-11-29 – 2016-12-01 (×4): 20 mg via ORAL
  Filled 2016-11-29 (×3): qty 1

## 2016-11-29 MED ORDER — IPRATROPIUM-ALBUTEROL 0.5-2.5 (3) MG/3ML IN SOLN: 3.0000 mL | Freq: Four times a day (QID) | RESPIRATORY_TRACT | Status: DC | PRN

## 2016-11-29 MED ORDER — ONDANSETRON HCL 4 MG/2ML IJ SOLN
4.0000 mg | Freq: Four times a day (QID) | INTRAMUSCULAR | Status: DC | PRN
  Administered 2016-11-30 – 2016-12-04 (×4): 4 mg via INTRAVENOUS
  Filled 2016-11-29 (×4): qty 2

## 2016-11-29 MED ORDER — MUPIROCIN 2 % EX OINT
1.0000 "application " | TOPICAL_OINTMENT | Freq: Two times a day (BID) | CUTANEOUS | Status: AC
  Administered 2016-11-29 – 2016-12-03 (×10): 1 via NASAL
  Filled 2016-11-29 (×2): qty 22

## 2016-11-29 MED ORDER — IPRATROPIUM-ALBUTEROL 0.5-2.5 (3) MG/3ML IN SOLN: 3.0000 mL | Freq: Four times a day (QID) | RESPIRATORY_TRACT | Status: DC

## 2016-11-29 MED ORDER — SODIUM CHLORIDE 0.9% FLUSH: 9.0000 mL | INTRAVENOUS | Status: DC | PRN

## 2016-11-29 MED ORDER — FAT EMULSION 20 % IV EMUL
240.0000 mL | INTRAVENOUS | Status: AC
  Administered 2016-11-29: 240 mL via INTRAVENOUS
  Filled 2016-11-29: qty 250

## 2016-11-29 MED ORDER — M.V.I. ADULT IV INJ
INTRAVENOUS | Status: AC
  Administered 2016-11-29: 18:00:00 via INTRAVENOUS
  Filled 2016-11-29: qty 1992

## 2016-11-29 MED ORDER — ALTEPLASE 2 MG IJ SOLR
2.0000 mg | Freq: Once | INTRAMUSCULAR | Status: AC
  Administered 2016-11-29: 2 mg
  Filled 2016-11-29: qty 2

## 2016-11-29 NOTE — Progress Notes (Signed)
CONSULT NOTE    Melanie [name]  [id number] DOB: 07-27-1936 DOA: 10/28/2016 PCP: Osborne Casco, MD   Brief Narrative:  Melanie [name] is a 81 y.o. female with prolonged hospitalization, admitted on 01/20 for necrotic cholecystitis. Developed post op bowel obstruction, re-intervened on 02/09 with exploratory laparotomy. NG tube in place, on TPN. Consulted for chest pain that ruled out for ACS. Patient has been in persistent pain, on PCA pump. Patient had third surgical intervention, emergent laparatomy, recurrent bowel obstruction with volvulus with ischemic segment. Resumed clonidine for blood pressure control. Worsening hypertension, started on nicardipine drip.  Clonidine dose increase to 0.2 mg, patient now off nicardipine.    Assessment & Plan:   Principal Problem:   Small bowel obstruction s/p ex lap & lysis of adhesions 11/17/2016 Active Problems:   Necrotic cholecystitis s/p lap cholecystectomy 10/30/2016   Essential hypertension   Hyperlipidemia   COPD (chronic obstructive pulmonary disease) (HCC)   GERD (gastroesophageal reflux disease)   Chest pain   H/O exploratory laparotomy   Anxiousness   Pressure injury of skin  Necrotic cholecystitis -management per primary team (surgery)  Post-operative bowel obstruction Volvulus Ischemic bowel S/p Zosyn. On TPN -per primary  Atypical Chest Pain This appears secondary to possible esophageal spasm/dysmotility. Improved with nitro -continue nitro prpn  Hypertension Remains poorly controlled, however, blood pressures not overly elevated -continue hydralazine -continue metoprolol -continue clonidine patch  COPD Patient with mild wheezing today with associated dyspnea. Likely developing exacerbation -CXR -Duoneb QID prn  Anemia Stable. - Transfuse for Hgb < 7   DVT prophylaxis: Lovenox Code Status: Full Family Communication: Daughter present at bedside Disposition Plan: Per surgical team   Procedures:    Laparoscopic cholecystectomy 10/30/16  Exploratory laparotomy with LOA 11/17/16  ECHO 2/12- EF 60-65%, G1DD.  NG (1/30-2/4, 2/9- )  Exploratory laparotomy (2/16)  Antimicrobials:  Zosyn (1/20-1/24, 1/27-2/9, 2/15-2/18)   Subjective: Chest pain is intermittent and made worse with swallowing. She has continued abdominal pain. She reports new dyspnea present on rest with no associated cough or sputum production at this time.  No breathing treatments given.  Objective: Vitals:   11/29/16 0000 11/29/16 0400 11/29/16 0540 11/29/16 1040  BP:   (!) 161/68   Pulse:   71   Resp: 19 18 20 20   Temp:   98.1 F (36.7 C)   TempSrc:   Oral   SpO2: 100% 100% 100% 97%  Weight:      Height:        Intake/Output Summary (Last 24 hours) at 11/29/16 1140 Last [name] filed at 11/29/16 0400  Gross per 24 hour  Intake          2014.08 ml  Output              600 ml  Net          1414.08 ml   Filed Weights   11/20/16 0946 11/27/16 0400 11/28/16 1753  Weight: 76.4 kg (168 lb 6.9 oz) 89.7 kg (197 lb 12 oz) 93.3 kg (205 lb 11 oz)    Examination:  General exam: Appears to be in pain, in mild distress Respiratory system: diminished bilaterally with scattered wheezing. Respiratory effort normal. Cardiovascular system: S1 & S2 heard, RRR. No murmurs Gastrointestinal system: Honeycomb dressing is intact and clean. Diffuse mild tenderness without guarding.  Central nervous system: Alert and oriented. No focal neurological deficits. Extremities: No edema. No calf tenderness Skin: No cyanosis. No rashes Psychiatry: Judgement and insight appear normal. Mood &  affect appropriate.     [name] Reviewed: I have personally reviewed following labs and imaging studies  CBC:  Recent Labs Lab 11/25/16 0849 11/26/16 0444 11/27/16 0444 11/28/16 0344 11/29/16 0835  WBC 23.0* 18.7* 16.4* 18.9* 16.4*  NEUTROABS  --   --  13.2*  --   --   HGB 9.1* 9.2* 9.2* 9.1* 8.9*  HCT 27.1* 27.6* 27.4* 26.6* 26.9*   MCV 79.9 81.4 80.8 79.9 80.5  PLT 370 391 417* 409* [id number]*   Basic Metabolic Panel:  Recent Labs Lab 11/23/16 0500 11/24/16 0557 11/25/16 0518 11/26/16 0444 11/27/16 0444 11/28/16 0344  NA 136 135 136 136 135 135  K 4.1 3.9 3.5 3.9 3.5 3.4*  CL 104 103 103 103 104 107  CO2 25 25 28 24 23 22   GLUCOSE 133* 131* 123* 133* 142* 135*  BUN 23* 20 19 19 17 17   CREATININE 0.53 0.59 0.47 0.44 0.44 0.42*  CALCIUM 8.4* 8.0* 7.9* 8.1* 7.9* 8.0*  MG 1.7  --  1.5* 1.8 1.7 1.8  PHOS 4.0  --  3.2  --  3.3  --    GFR: Estimated Creatinine Clearance: 61.7 mL/min (by C-G formula based on SCr of 0.42 mg/dL (L)). Liver Function Tests:  Recent Labs Lab 11/23/16 0500 11/25/16 0518 11/27/16 0444 11/28/16 0344  AST 49* 121* 46* 31  ALT 42 186* 107* 80*  ALKPHOS 253* 236* 278* 255*  BILITOT 1.2 1.4* 1.3* 1.3*  PROT 6.4* 5.4* 6.1* 6.1*  ALBUMIN 2.1* 1.6* 1.7* 1.7*   No results for input(s): LIPASE, AMYLASE in the last 168 hours. No results for input(s): AMMONIA in the last 168 hours. Coagulation Profile: No results for input(s): INR, PROTIME in the last 168 hours. Cardiac Enzymes:  Recent Labs Lab 11/27/16 0032 11/27/16 0611 11/27/16 1209  TROPONINI 0.15* 0.12* 0.11*   BNP (last 3 results) No results for input(s): PROBNP in the last 8760 hours. HbA1C: No results for input(s): HGBA1C in the last 72 hours. CBG:  Recent Labs Lab 11/28/16 0604 11/28/16 1156 11/28/16 1513 11/28/16 2140 11/29/16 0614  GLUCAP 138* 132* 104* 118* 125*   Lipid Profile:  Recent Labs  11/27/16 0444  TRIG 111   Thyroid Function Tests: No results for input(s): TSH, T4TOTAL, FREET4, T3FREE, THYROIDAB in the last 72 hours. Anemia Panel: No results for input(s): VITAMINB12, FOLATE, FERRITIN, TIBC, IRON, RETICCTPCT in the last 72 hours. Sepsis Labs:  Recent Labs Lab 11/24/16 1522 11/25/16 0518 11/26/16 0444  PROCALCITON 0.56 0.65 3.51    Recent Results (from the past 240 hour(s))  Urine  culture     Status: None   Collection Time: 11/20/16  8:51 AM  Result Value Ref Range Status   Specimen Description URINE, CATHETERIZED  Final   Special Requests NONE  Final   Culture   Final    NO GROWTH Performed at [facility], [place] [address] [place], [place] [address]    Report Status 11/21/2016 FINAL  Final  MRSA PCR Screening     Status: None   Collection Time: 11/20/16  3:50 PM  Result Value Ref Range Status   MRSA by PCR NEGATIVE NEGATIVE Final    Comment:        The GeneXpert MRSA Assay (FDA approved for NASAL specimens only), is one component of a comprehensive MRSA colonization surveillance program. It is not intended to diagnose MRSA infection nor to guide or monitor treatment for MRSA infections.   Culture, blood (Routine X 2) w  Reflex to ID Panel     Status: None   Collection Time: 11/24/16  1:45 PM  Result Value Ref Range Status   Specimen Description BLOOD LEFT HAND  Final   Special Requests BOTTLES DRAWN AEROBIC AND ANAEROBIC 5CC EACH  Final   Culture   Final    NO GROWTH 5 DAYS Performed at [facility], [place] [address], [place], [place] [address]    Report Status 11/29/2016 FINAL  Final  Culture, blood (Routine X 2) w Reflex to ID Panel     Status: None   Collection Time: 11/24/16  1:50 PM  Result Value Ref Range Status   Specimen Description BLOOD LEFT HAND  Final   Special Requests IN PEDIATRIC BOTTLE .[place]  Final   Culture   Final    NO GROWTH 5 DAYS Performed at [facility], [place] [address], [place], [place] [address]    Report Status 11/29/2016 FINAL  Final  MRSA PCR Screening     Status: Abnormal   Collection Time: 11/28/16 12:40 PM  Result Value Ref Range Status   MRSA by PCR POSITIVE (A) NEGATIVE Final    Comment:        The GeneXpert MRSA Assay (FDA approved for NASAL specimens only), is one component of a comprehensive MRSA colonization surveillance program. It is not intended to diagnose MRSA infection nor to  guide or monitor treatment for MRSA infections. RESULT CALLED TO, READ BACK BY AND VERIFIED WITH: F.AKAND RN AT [id number] ON 11/28/16 BY S.VANHOORNE          Radiology Studies: No results found.      Scheduled Meds: . bisacodyl  10 mg Rectal Daily  . Chlorhexidine Gluconate Cloth  6 each Topical Q0600  . cloNIDine  0.2 mg Transdermal Weekly  . cloNIDine  0.1 mg Per Tube Once  . enoxaparin (LOVENOX) injection  40 mg Subcutaneous Q24H  . hydrALAZINE  20 mg Intravenous Q6H  . HYDROmorphone   Intravenous Q4H  . insulin aspart  0-9 Units Subcutaneous Q8H  . lip balm  1 application Topical BID  . methocarbamol (ROBAXIN)  IV  1,000 mg Intravenous Q8H  . metoprolol  10 mg Intravenous Q6H  . mupirocin ointment  1 application Nasal BID  . nitroGLYCERIN  1 inch Topical Q6H  . pantoprazole (PROTONIX) IV  40 mg Intravenous Q12H   Continuous Infusions: . sodium chloride 50 mL/hr at 11/28/16 1809  . niCARDipine Stopped (11/27/16 1311)  . Marland KitchenTPN (CLINIMIX-E) Adult 75 mL/hr at 11/28/16 1708     LOS: 32 days    Cordelia Poche [facility] 11/29/2016, 11:40 AM Pager: [phone]  If 7PM-7AM, please contact night-coverage [email or web address] Password [email or web address]

## 2016-11-29 NOTE — Progress Notes (Signed)
5 Days Post-Op  Subjective: She actually looks very good this a.m. Pain seems to be well-controlled not an issue today. She is having bowel movements, flatus. Waffle dressing looks fine. Still weight continues to rise she is up to 93 kg. looks very weak and dyspneic just laying down.  Objective: Vital signs in last 24 hours: Temp:  [97.3 F (36.3 C)-98.2 F (36.8 C)] 98.1 F (36.7 C) (02/21 0540) Pulse Rate:  [71-98] 71 (02/21 0540) Resp:  [15-20] 20 (02/21 1040) BP: (157-194)/(50-68) 161/68 (02/21 0540) SpO2:  [96 %-100 %] 97 % (02/21 1040) Weight:  [93.3 kg (205 lb 11 oz)] 93.3 kg (205 lb 11 oz) (02/20 1753) Last BM Date: 11/28/16 2779 IV Urine 600 recorded NG 50 recorded BM 1 this a.m. Afebrile vital signs are stable. Hypertension is better. WBC slowly improving Anemia is stable She weight 76.4 kg on 11/20/16. 89 kg on 11/26/16. On 11/28/16 her weight is up to 93.3 kg.   Intake/Output from previous day: 02/20 0701 - 02/21 0700 In: 2779.3 [I.V.:2394.3; IV Piggyback:385] Out: 650 [Urine:600; Emesis/NG output:50] Intake/Output this shift: No intake/output data recorded.  General appearance: alert, cooperative and no distress Resp: anterior exam GI: Soft, not distended, positive flatus and bowel sounds, positive BM midline incision looks fine. Waffle dressing still in place.  Lab Results:   Recent Labs  11/28/16 0344 11/29/16 0835  WBC 18.9* 16.4*  HGB 9.1* 8.9*  HCT 26.6* 26.9*  PLT 409* 403*    BMET  Recent Labs  11/27/16 0444 11/28/16 0344  NA 135 135  K 3.5 3.4*  CL 104 107  CO2 23 22  GLUCOSE 142* 135*  BUN 17 17  CREATININE 0.44 0.42*  CALCIUM 7.9* 8.0*   PT/INR No results for input(s): LABPROT, INR in the last 72 hours.   Recent Labs Lab 11/23/16 0500 11/25/16 0518 11/27/16 0444 11/28/16 0344  AST 49* 121* 46* 31  ALT 42 186* 107* 80*  ALKPHOS 253* 236* 278* 255*  BILITOT 1.2 1.4* 1.3* 1.3*  PROT 6.4* 5.4* 6.1* 6.1*  ALBUMIN 2.1*  1.6* 1.7* 1.7*     Lipase     Component Value Date/Time   LIPASE 13 11/11/2016 0945     Studies/Results: No results found.  Medications: . bisacodyl  10 mg Rectal Daily  . Chlorhexidine Gluconate Cloth  6 each Topical Q0600  . cloNIDine  0.2 mg Transdermal Weekly  . cloNIDine  0.1 mg Per Tube Once  . enoxaparin (LOVENOX) injection  40 mg Subcutaneous Q24H  . hydrALAZINE  20 mg Intravenous Q6H  . HYDROmorphone   Intravenous Q4H  . insulin aspart  0-9 Units Subcutaneous Q8H  . lip balm  1 application Topical BID  . methocarbamol (ROBAXIN)  IV  1,000 mg Intravenous Q8H  . metoprolol  10 mg Intravenous Q6H  . mupirocin ointment  1 application Nasal BID  . nitroGLYCERIN  1 inch Topical Q6H  . pantoprazole (PROTONIX) IV  40 mg Intravenous Q12H   . sodium chloride 50 mL/hr at 11/28/16 1809  . niCARDipine Stopped (11/27/16 1311)  . Marland KitchenTPN (CLINIMIX-E) Adult 75 mL/hr at 11/28/16 1708    Assessment/Plan Admitted 1/20 - Medicine S/p Laparoscopic Cholecystectomy with intraoperative cholangiogram, 10/30/16, Dr. Rodman Key Martin;Necrotic gallbladder with free intrapertioneal bile; enlarged CBD with free flow into the duodenum POD 30 Exploratory laparotomy with LOA - findings: Mechanical obstruction, 11/17/16, Dr. Dalbert Batman POD 12 Exploratory laparotomy, LOA, reduction of SB volvulus 2/16- Dr. 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 Echo EF 0000000 1 diastolic dysfunction/Trivial MR- continue prn nitro Anemia Stable post transfusion. Severe protein calorie malnutrition -prealbumin 8.3 - on TNA.              Recheck prealbumin tomorrow. Fluid overload HTN - lopressor, prn hydralazine,  COPD-IS GERD-protonix History infected knee after TKA. History  MRSA - On isolation FEN: IV fluids/NPO ID: Zosyn 1/21- 11/16/16, on again 2/15-2/17.               No abscess at time of surgery. No intraabdominal contamination. Currently no antibitotics DVT prophylaxis: Lovenox    Plan: Mobilize she needs PT and OT daily. Start her on clear liquids today.   Plan to discontinue the PCA tomorrow. Start her on some low-dose Lasix to assist with fluid overload.   Recheck labs in a.m. Stop NS and leave her just on the TNA for now.  Check on discontinuing her nitro paste also.  I gave it to her for chest pain over a week ago.    LOS: 32 days    MelanieWILLARD 11/29/2016 [phone]  Agree with above. Seems to be getting better.  She needs to move more.  Melanie Overall, MD, [facility] Surgery Pager: [phone] Office phone:  [phone]

## 2016-11-29 NOTE — Care Management Important Message (Addendum)
Important Message  Patient Details IM Letter given to [name]/Case Manager to present to Patient Name: SKYLYNN BOTERO MRN: [id number] Date of Birth: September 27, 1936   Medicare Important Message Given:  Yes    Kerin Salen 11/29/2016, 10:46 AMImportant Message  Patient Details  Name: KIRBI SIRMON MRN: [id number] Date of Birth: 01/26/1936   Medicare Important Message Given:  Yes    Kerin Salen 11/29/2016, 10:46 AM

## 2016-11-29 NOTE — Progress Notes (Signed)
Nutrition Follow-up  DOCUMENTATION CODES:   Severe malnutrition in context of acute illness/injury  INTERVENTION:   TPN per pharmacy- Currently:   Continue Clinimix E 5/15 at goal rate of 75 ml/hr.  20% fat emulsion at 20 ml/hr over 12 hrs  Trace elements are on national shortage, will be added as available.   Multivitamins will be added daily.  Continue sensitive SSI q8h.   TPN lab panels on Mondays & Thursdays.  Prostat liquid protein PO 30 ml BID with meals, each supplement provides 100 kcal, 15 grams protein.  NUTRITION DIAGNOSIS:   Malnutrition related to acute illness as evidenced by energy intake < or equal to 50% for > or equal to 5 days, mild depletion of body fat, severe depletion of muscle mass. On TPN   GOAL:   Patient will meet greater than or equal to 90% of their needs  MONITOR:   Weight trends, Labs, I & O's, Skin, TPN regimen  ASSESSMENT:   81 y.o. female with medical history significant of gallstones presents to the hospital with the chief complaint of abdominal pain. s/p laparoscopic cholecystectomy 10/30/16. Post op ileus, Exploratory laparotomy with LOA - findings:  Mechanical obstruction, 11/17/16, Dr. Dalbert Batman; Patient had third surgical intervention, emergent laparatomy d/t recurrent bowel obstruction with volvulus with ischemic segment, 2/16   Significant events: 1/22: lap chole with cholangiogram 1/30: abd KUB with suspicion for post-op ileus; NG tube placed 2/2: high NG output from 1/31-2/1 (1355m) but decreased 2/1-2/2 (300 ml). RN reported foul smell from NG tube --CCS may d/c NG 2/3: trial clamping NG - successful 2/4 NGT removed. Switching from E 5/20 to E 5/15 to better align with nutritional goals 2/6 leave NGT out, continue NPO, GI consult 2/8 Xray consistent with ileus or SBO - possible ex lap tomorrow 2/9: Underwent exploratorylaparotomy, lysis of adhesions.  2/12 Transferred to ICU for chest pain, elevated troponin. 2/16-Acute  abdomen with sepsis-Emergency exploratory laparotomy, lysis of adhesions, reduction of volvulus. 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 Pt was noted to have mesenteric volvulus with transient jejunal ischemia. Met with pt in room today. NGT removed 2/20. Pt continues to have abdominal pain but reports that she is feeling better. Patient had BM yesterday and is passing flatus. Pt started on a clear liquid diet today. Pt with moderate to severe edema in all four extremities; per MD note, there is concern for possible fluid overload and pt started on Lasix. Pt has gained 56lbs since admit; likely the majority of this weight is from fluid changes. Plan is to discontinue IVF and continue with TPN. RD adjusted pt's estimated needs today. Spoke to pharmacy about increasing pt's protein in TPN since pt now s/p a third surgery, with severe edema, and now with a pressure injury. Pt's pre-albumin was 6 on 2/19. Now that pt is initiated on clear liquid diet, RD will order Prostat BID to try and increase pt's protein intake. RD spoke to patient and she is wiling to try the Prostat. Pt's potassium slightly low today; pharmacy is supplementing as needed. Phosphorus and Magnesium normal.   Medications reviewed and include: dulcolax, lovenox, lasix, hydromorphone, insulin, protonix   Labs  reviewed: K 3.4(L), Mg 1.8 wnl, creat 0.42(L), Ca 8.0(L) adj. 9.84 wnl, AlkPhos 255(H), Alb 1.7(L), tbili 1.3(H)- labs from 2/20 P 3.3 wnl- 2/19 Wbc- 16.4(H), Hgb 8.9(L), Hct 26.9(L) Pre-albumin 6(L) 2/19  Diet Order:  TPN (CLINIMIX-E) Adult Diet clear liquid Room service appropriate? Yes; Fluid  consistency: Thin .TPN (CLINIMIX-E) Adult  Skin:  Wound (see comment) (Stage II anus, abdominal incision )  Last BM:  2/20  Height:   Ht Readings from Last 1 Encounters:  11/28/16 5' 2"  (1.575 m)    Weight:   Wt Readings from Last 1 Encounters:  11/29/16 211 lb 10.2 oz (96 kg)    Ideal Body Weight:  52.3 kg  BMI:  Body mass index is 38.71 kg/m.  Estimated Nutritional Needs:   Kcal:  1600-1800kcal/day   Protein:  105-120g/day   Fluid:  >1.6L/day   EDUCATION NEEDS:   No education needs identified at this time  Koleen Distance, RD, LDN Pager #[phone]

## 2016-11-29 NOTE — Progress Notes (Signed)
[place] NOTE   Pharmacy Consult for TPN Indication: prolonged ileus  Patient Measurements: Height: 5\' 2"  (157.5 cm) Weight: 211 lb 10.2 oz (96 kg) (bed weight) IBW/kg (Calculated) : 50.1 TPN AdjBW (KG): 60.9 Body mass index is 38.71 kg/m.  Insulin Requirements: 1 Unit SSI   Current Nutrition: Clear liquids  IVF: none  Central access: PICC TPN start date: 11/08/16  ASSESSMENT                                                                                                          HPI: Patient is [name] with hx of gallstones presented to the ED on 1/20 with c/o abdominal pain. MRCP on 1/22 showed distended gallbladder with tiny gallstones and wall thickening with suspicion for acute cholecystitis. Patient underwent lap cholecystectomy with intraoperative cholangiogram on 1/22.  To start TPN for prolonged ileus and poor oral intake.  Significant events:  1/22: lap chole with cholangiogram 1/30: abd KUB with suspicion for post-op ileus; NG tube placed  2/2: high NG output from 1/31-2/1 (1348ml) but decreased 2/1-2/2 (300 ml). RN reported foul smell from NG tube --CCS may d/c NG 2/3: trial clamping NG - successful 2/4 NGT removed. Switching from E 5/20 to E 5/15 to better align with nutritional goals 2/6 leave NGT out, continue NPO, GI consult 2/8 Xray consistent with ileus or SBO - possible ex lap tomorrow 2/9: Underwent exploratory laparotomy, lysis of adhesions.  2/16: back to OR for recurrent SBO.  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 2/21: starting clear liquids; fluid-overloaded per CCS, starting lasix and stopping IVF. Nutrition requesting bumping goal rate to give full 2L daily (83 ml/hr) as patient with several surgeries since last assessment  Today 11/29/16:   Glucose: no Hx DM, CBGs controlled  Electrolytes: (as of 2/20) K slightly low and replaced, all others WNL including corrected Ca  Renal:  SCr/BUN ok; I/O likely not accurate at ~30L+ since admit, but does reflect almost 20 kg weight gain  LFTs: (as of 2/20) AlkPhos and ALT elevated (TPN related?) but trending down; Tbili slightly elevated, albumin low  TGs: wnl, 69 (2/12), 111 (2/19)  Prealbumin: 5.9 (2/5), 8.3 (2/12), decreased to 6.0 (2/19)  NUTRITIONAL GOALS                                                                                             RD recs (2/15) Kcal:  1750-1950 Protein:  80-90g Fluid:  1.8L/day  Clinimix E 5/15 (switched from E 5/20 at 70 ml/hr due to availability) at a goal rate of 75 ml/hr + 20% fat emulsion at 20 ml/hr x 12 hr to  provide:  90 g/day protein, 1758 Kcal/day.  (+++There is currently a national backorder of Clinimix solution. Pharmacy will use Clinimix product based on availability+++)  PLAN   Hold off on repleting K as no labs drawn following supplementation yesterday  At 1800 today:  Continue Clinimix E 5/15 at but will increase rate to 83 ml/hr (provides 1894 kcal/day, 100 g/day protein)  20% fat emulsion at 20 ml/hr over 12 hrs  Trace elements are on national shortage, will be added every other day (last added 2/20).  Multivitamins will be added daily.  Continue sensitive SSI q8h.   TPN lab panels on Mondays & Thursdays.   Reuel Boom, PharmD, BCPS Pager: [phone] 11/29/2016, 4:23 PM

## 2016-11-29 NOTE — Evaluation (Signed)
Physical Therapy Re-Evaluation Patient Details Name: Melanie [name] MRN: [id number] DOB: [birth year] Today's Date: 11/29/2016   History of Present Illness  81 y.o. female with medical history significant of gallstones presents to the hospital with the chief complaint of abdominal pain. s/p laparoscopic cholecystectomy 10/30/16. Post op ileus, Exploratory laparotomy with LOA - findings:  Mechanical obstruction, 11/17/16, Dr. Dalbert Batman; Patient had third surgical intervention, emergent laparatomy d/t recurrent bowel obstruction with volvulus with ischemic segment, 2/16  Clinical Impression  Pt admitted with above diagnosis. Pt currently with functional limitations due to the deficits listed below (see PT Problem List).  Pt will benefit from skilled PT to increase their independence and safety with mobility to allow discharge to the venue listed below.   Pt is severely deconditioned due to prolonged hospital stay and multiple procedures---recommend SNF post acute; will continue to follow in acute setting, pt is cooperative and motivated but quite limited today d/t incr SOB at rest and incr WOB with activity;       Follow Up Recommendations SNF    Equipment Recommendations  None recommended by PT    Recommendations for Other Services       Precautions / Restrictions Precautions Precautions: Fall      Mobility  Bed Mobility Overal bed mobility: Needs Assistance Bed Mobility: Rolling;Sidelying to Sit;Sit to Sidelying Rolling: Mod assist Sidelying to sit: Mod assist;Max assist;HOB elevated     Sit to sidelying: Mod assist General bed mobility comments: assist with LEs off and onto bed, assist to bring trunk to upright and lower, bed pad used to assist scooting; incr time required  Transfers Overall transfer level: Needs assistance Equipment used: Rolling walker (2 wheeled) Transfers: Sit to/from Stand Sit to Stand: Mod assist;From elevated surface         General transfer comment:  assist to rise and stabilize, cues for hand placement  Ambulation/Gait             General Gait Details: unable d/t incr WOB,  lateral steps along EOB with min/mod assist  Stairs            Wheelchair Mobility    Modified Rankin (Stroke Patients Only)       Balance Overall balance assessment: Needs assistance Sitting-balance support: Feet supported;Single extremity supported Sitting balance-Leahy Scale: Fair       Standing balance-Leahy Scale: Poor Standing balance comment: reliant on UEs for balance                             Pertinent Vitals/Pain Pain Assessment: Faces Faces Pain Scale: Hurts whole lot Pain Location: abdomen with mobility Pain Descriptors / Indicators: Sore;Guarding;Grimacing Pain Intervention(s): Limited activity within patient's tolerance;Monitored during session;PCA encouraged    Home Living Family/patient expects to be discharged to:: Private residence Living Arrangements: Alone Available Help at Discharge: Family;Available 24 hours/day   Home Access: Level entry     Home Layout: One level Home Equipment: Toilet riser;Cane - single point      Prior Function Level of Independence: Independent with assistive device(s)         Comments: walks with SPC at baseline     Hand Dominance        Extremity/Trunk Assessment   Upper Extremity Assessment Upper Extremity Assessment: Generalized weakness    Lower Extremity Assessment Lower Extremity Assessment: Generalized weakness;RLE deficits/detail RLE Deficits / Details: limited ROM knee--grossly 10-35* flexion;        Communication  Communication: No difficulties  Cognition Arousal/Alertness: Awake/alert Behavior During Therapy: WFL for tasks assessed/performed   Area of Impairment: Memory     Memory: Decreased short-term memory       Problem Solving: Slow processing;Requires verbal cues      General Comments      Exercises General Exercises -  Lower Extremity Ankle Circles/Pumps: AROM;Both;10 reps Heel Slides: AROM;10 reps;Both;Limitations Heel Slides Limitations: right knee ext contracture   Assessment/Plan    PT Assessment Patient needs continued PT services  PT Problem List Decreased mobility;Decreased balance;Decreased knowledge of use of DME;Pain;Decreased activity tolerance;Decreased strength       PT Treatment Interventions DME instruction;Gait training;Functional mobility training;Therapeutic exercise;Therapeutic activities;Patient/family education;Balance training    PT Goals (Current goals can be found in the Care Plan section)  Acute Rehab PT Goals Patient Stated Goal: return to church activities PT Goal Formulation: With patient/family Time For Goal Achievement: 12/13/16    Frequency Min 3X/week   Barriers to discharge        Co-evaluation               End of Session   Activity Tolerance: Patient limited by pain;Patient limited by fatigue;Other (comment) (incr WOB) Patient left: in bed;with call bell/phone within reach;with family/visitor present   PT Visit Diagnosis: Muscle weakness (generalized) (M62.81)         Time: [id number] PT Time Calculation (min) (ACUTE ONLY): 25 min   Charges:   PT Evaluation $PT Re-evaluation: 1 Procedure PT Treatments $Therapeutic Activity: 8-22 mins   PT G Codes:         Moe Brier Dec 20, 2016, 1:23 PM

## 2016-11-30 ENCOUNTER — Encounter (HOSPITAL_COMMUNITY): Payer: Medicare Other

## 2016-11-30 DIAGNOSIS — F419 Anxiety disorder, unspecified: Secondary | ICD-10-CM

## 2016-11-30 LAB — PHOSPHORUS: PHOSPHORUS: 3.8 mg/dL (ref 2.5–4.6)

## 2016-11-30 LAB — COMPREHENSIVE METABOLIC PANEL
ALT: 47 U/L (ref 14–54)
AST: 27 U/L (ref 15–41)
Albumin: 1.7 g/dL — ABNORMAL LOW (ref 3.5–5.0)
Alkaline Phosphatase: 227 U/L — ABNORMAL HIGH (ref 38–126)
Anion gap: 5 (ref 5–15)
BUN: 18 mg/dL (ref 6–20)
CO2: 23 mmol/L (ref 22–32)
Calcium: 7.9 mg/dL — ABNORMAL LOW (ref 8.9–10.3)
Chloride: 109 mmol/L (ref 101–111)
Creatinine, Ser: 0.4 mg/dL — ABNORMAL LOW (ref 0.44–1.00)
GFR calc Af Amer: >60 mL/min (ref >60)
GFR calc non Af Amer: >60 mL/min (ref >60)
Glucose, Bld: 126 mg/dL — ABNORMAL HIGH (ref 65–99)
Potassium: 3.5 mmol/L (ref 3.5–5.1)
Sodium: 137 mmol/L (ref 135–145)
Total Bilirubin: 0.6 mg/dL (ref 0.3–1.2)
Total Protein: 5.8 g/dL — ABNORMAL LOW (ref 6.5–8.1)

## 2016-11-30 LAB — GLUCOSE, CAPILLARY
GLUCOSE-CAPILLARY: 116 mg/dL — AB (ref 65–99)
GLUCOSE-CAPILLARY: 119 mg/dL — AB (ref 65–99)
GLUCOSE-CAPILLARY: 122 mg/dL — AB (ref 65–99)
Glucose-Capillary: 125 mg/dL — ABNORMAL HIGH (ref 65–99)

## 2016-11-30 LAB — CBC
HCT: 25.5 % — ABNORMAL LOW (ref 36.0–46.0)
Hemoglobin: 8.6 g/dL — ABNORMAL LOW (ref 12.0–15.0)
MCH: 27.2 pg (ref 26.0–34.0)
MCHC: 33.7 g/dL (ref 30.0–36.0)
MCV: 80.7 fL (ref 78.0–100.0)
Platelets: 397 10*3/uL (ref 150–400)
RBC: 3.16 MIL/uL — ABNORMAL LOW (ref 3.87–5.11)
RDW: 18.1 % — ABNORMAL HIGH (ref 11.5–15.5)
WBC: 16.5 10*3/uL — ABNORMAL HIGH (ref 4.0–10.5)

## 2016-11-30 LAB — MAGNESIUM: Magnesium: 1.7 mg/dL (ref 1.7–2.4)

## 2016-11-30 MED ORDER — FAT EMULSION 20 % IV EMUL
240.0000 mL | INTRAVENOUS | Status: AC
  Administered 2016-11-30: 240 mL via INTRAVENOUS
  Filled 2016-11-30: qty 250

## 2016-11-30 MED ORDER — HYDROCHLOROTHIAZIDE 25 MG PO TABS
25.0000 mg | ORAL_TABLET | Freq: Every day | ORAL | Status: DC
  Administered 2016-11-30 – 2016-12-04 (×5): 25 mg via ORAL
  Filled 2016-11-30 (×5): qty 1

## 2016-11-30 MED ORDER — M.V.I. ADULT IV INJ: INTRAVENOUS | Status: DC

## 2016-11-30 MED ORDER — TRACE MINERALS CR-CU-MN-SE-ZN 10-1000-500-60 MCG/ML IV SOLN
INTRAVENOUS | Status: AC
  Administered 2016-11-30: 17:00:00 via INTRAVENOUS
  Filled 2016-11-30: qty 1992

## 2016-11-30 MED ORDER — FAT EMULSION 20 % IV EMUL: 240.0000 mL | INTRAVENOUS | Status: DC

## 2016-11-30 NOTE — Progress Notes (Signed)
[place] NOTE   Pharmacy Consult for TPN Indication: prolonged ileus  Patient Measurements: Height: 5\' 2"  (157.5 cm) Weight: 211 lb 10.2 oz (96 kg) (bed weight) IBW/kg (Calculated) : 50.1 TPN AdjBW (KG): 60.9 Body mass index is 38.71 kg/m.  Insulin Requirements: 1 Unit SSI/ 24 hr  Current Nutrition: Clear liquids, Pro-stat tid   IVF: none  Central access: PICC TPN start date: 11/08/16  ASSESSMENT                                                                   HPI:  81 y.o F with hx of gallstones presented to the ED on 1/20 with c/o abdominal pain. MRCP on 1/22 showed distended gallbladder with tiny gallstones and wall thickening with suspicion for acute cholecystitis. Patient underwent lap cholecystectomy with intraoperative cholangiogram on 1/22.  To start TPN for prolonged ileus and poor oral intake.  Significant events:  1/22: lap chole with cholangiogram 1/30: abd KUB with suspicion for post-op ileus; NG tube placed  2/2: high NG output from 1/31-2/1 (1342ml) but decreased 2/1-2/2 (300 ml). RN reported foul smell from NG tube --CCS may d/c NG 2/3: trial clamping NG - successful 2/4 NGT removed. Switching from E 5/20 to E 5/15 to better align with nutritional goals 2/6 leave NGT out, continue NPO, GI consult 2/8 Xray consistent with ileus or SBO - possible ex lap tomorrow 2/9: Underwent exploratory laparotomy, lysis of adhesions.  2/16: back to OR for recurrent SBO.  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 2/21: starting clear liquids; fluid-overloaded per CCS, starting lasix and stopping IVF. Nutrition requesting increasing goal rate to give full 2L daily (83 ml/hr) as patient with several surgeries since last assessment  Today 11/30/16:   Glucose: no Hx DM, CBGs controlled  Electrolytes: K low normal after repletion,  others WNL, corrected Ca 9.3  Renal: SCr/BUN ok; I/O likely not accurate at ~30L+ since  admit, but does reflect almost 20 kg weight gain  LFTs:  AlkPhos and ALT elevated (TPN related?) but trending down; T bili now wnl, albumin low  TGs: wnl, 69 (2/12), 111 (2/19)  Prealbumin: 5.9 (2/5), 8.3 (2/12), decreased to 6.0 (2/19)  NUTRITIONAL GOALS                                                                                             RD recs (2/15) Kcal:  1750-1950 Protein:  80-90g Fluid:  1.8L/day  Clinimix E 5/15 (switched from E 5/20 at 70 ml/hr due to availability) at a goal rate of 75 ml/hr + 20% fat emulsion at 20 ml/hr x 12 hr to provide:  90 g/day protein, 1758 Kcal/day.  Clinimix E 5/15 at 83 ml/hr + 20 Lipids at 20 ml/hr x 12 hr will provide 100gm protein, ~1900 kCal per day  (+++There is currently a national backorder of Clinimix solution.  Pharmacy will use Clinimix product based on availability+++)  PLAN   Hold off on repleting K as no labs drawn following supplementation yesterday  At 1800 today:  Continue Clinimix E 5/15 at 83 ml/hr (provides 1894 kcal/day, 100 g/day protein)  20% fat emulsion at 20 ml/hr over 12 hrs  Trace elements are on national shortage, will be added every other day (last added 2/22).  Multivitamins will be added daily.  Continue sensitive SSI q8h.   TPN lab panels on Mondays & Thursdays  Minda Ditto PharmD Pager [phone] 11/30/2016, 11:53 AM

## 2016-11-30 NOTE — Evaluation (Signed)
Occupational Therapy Evaluation Patient Details Name: Melanie [name] MRN: [id number] DOB: Jun 18, 1936 Today's Date: 11/30/2016    History of Present Illness 82 y.o. female with medical history significant of gallstones presents to the hospital with the chief complaint of abdominal pain. s/p laparoscopic cholecystectomy 10/30/16. Post op ileus, Exploratory laparotomy with LOA - findings:  Mechanical obstruction, 11/17/16, Dr. Dalbert Batman; Patient had third surgical intervention, emergent laparatomy d/t recurrent bowel obstruction with volvulus with ischemic segment, 2/16   Clinical Impression   Pt was admitted for the above conditions and sxs. She was mod I for adls at baseline and will benefit from continued OT to increase safety and independence. Goals in acute are for min guard to mod A. She currently needs min A for SPT and up to total A for toilet hygiene;max A for LB adls.  Pt fatiques quickly, but she is very motivated and wants to do what she can to restore her strength    Follow Up Recommendations  SNF    Equipment Recommendations  Other (comment) possibly 3:1.  Pt has a toilet riser   Recommendations for Other Services       Precautions / Restrictions Precautions Precautions: Fall Restrictions Weight Bearing Restrictions: No      Mobility Bed Mobility        rolling:  Mod A sidelying to sit:  Mod A  Cues for sequencing, assist for hips with rolling and trunk to sit up          Transfers          sit to stand:  Min A Cues for UE placement. Assist to rise and stabilize            Balance     Sitting balance-Leahy Scale: Fair       Standing balance-Leahy Scale: Poor Standing balance comment: needs at least single UE support for balance                            ADL Overall ADL's : Needs assistance/impaired Eating/Feeding: NPO   Grooming: Set up;Oral care;Sitting   Upper Body Bathing: Minimal assistance;Sitting Upper Body Bathing Details  (indicate cue type and reason): lines Lower Body Bathing: Maximal assistance;Sit to/from stand   Upper Body Dressing : Moderate assistance Upper Body Dressing Details (indicate cue type and reason): multiple lines Lower Body Dressing: Maximal assistance;Sit to/from stand   Toilet Transfer: Minimal assistance;Stand-pivot;BSC   Toileting- Clothing Manipulation and Hygiene: Total assistance;Sit to/from stand         General ADL Comments: performed SPT to [facility] then sat in chair. Pt needs a lot of rest breaks:  fatiques easily but she is very motivated     Museum/gallery curator      Pertinent Vitals/Pain Pain Assessment: Faces Faces Pain Scale: Hurts a little bit Pain Location: abdomen with mobility Pain Descriptors / Indicators: Sore Pain Intervention(s): Limited activity within patient's tolerance;Monitored during session     Hand Dominance     Extremity/Trunk Assessment Upper Extremity Assessment Upper Extremity Assessment: Generalized weakness           Communication Communication Communication: No difficulties   Cognition Arousal/Alertness: Awake/alert Behavior During Therapy: WFL for tasks assessed/performed Overall Cognitive Status: Within Functional Limits for tasks assessed                     General Comments  Exercises       Shoulder Instructions      Home Living Family/patient expects to be discharged to:: Private residence Living Arrangements: Alone Available Help at Discharge: Family;Available 24 hours/day         Home Layout: One level         Bathroom Toilet: Standard     Home Equipment: Toilet riser;Cane - single point          Prior Functioning/Environment Level of Independence: Independent with assistive device(s)        Comments: walks with SPC at baseline        OT Problem List: Decreased strength;Impaired balance (sitting and/or standing);Pain;Cardiopulmonary status limiting  activity;Decreased activity tolerance      OT Treatment/Interventions: Self-care/ADL training;Energy conservation;DME and/or AE instruction;Therapeutic activities;Patient/family education;Balance training    OT Goals(Current goals can be found in the care plan section) Acute Rehab OT Goals Patient Stated Goal: return to church activities OT Goal Formulation: With patient Potential to Achieve Goals: Good ADL Goals Pt Will Perform Grooming: with min guard assist;standing Pt Will Perform Upper Body Bathing: sitting;with min guard assist Pt Will Perform Lower Body Bathing: with min assist;with adaptive equipment;sit to/from stand Pt Will Perform Upper Body Dressing: with min guard assist;sitting Pt Will Perform Lower Body Dressing: with mod assist;with adaptive equipment;sit to/from stand Pt Will Transfer to Toilet: with min guard assist;ambulating;bedside commode Pt Will Perform Toileting - Clothing Manipulation and hygiene: with min assist;sit to/from stand Additional ADL Goal #1: pt will verbalize 3 energy conservation strategies  OT Frequency: Min 2X/week   Barriers to D/C:            Co-evaluation              End of Session Nurse Communication: Mobility status  Activity Tolerance: Patient tolerated treatment well Patient left: in chair;with call bell/phone within reach;with chair alarm set  OT Visit Diagnosis: Unsteadiness on feet (R26.81);Muscle weakness (generalized) (M62.81)                ADL either performed or assessed with clinical judgement  Time: 1103-1131 OT Time Calculation (min): 28 min Charges:  OT General Charges $OT Visit: 1 Procedure OT Evaluation $OT Eval Moderate Complexity: 1 Procedure OT Treatments $Self Care/Home Management : 8-22 mins G-Codes:     Melanie [name], OTR/L [id number] 11/30/2016  Melanie [name] 11/30/2016, 11:43 AM

## 2016-11-30 NOTE — Clinical Social Work Note (Signed)
Clinical Social Work Assessment  Patient Details  Name: Melanie [name] MRN: [id number] Date of Birth: 1936/02/01  Date of referral:  11/30/16               Reason for consult:  Facility Placement                Permission sought to share information with:  Facility Art therapist granted to share information::  Yes, Verbal Permission Granted  Name::        Agency::     Relationship::     Contact Information:     Housing/Transportation Living arrangements for the past 2 months:  Apartment Source of Information:  Patient Patient Interpreter Needed:  None Criminal Activity/Legal Involvement Pertinent to Current Situation/Hospitalization:  No - Comment as needed Significant Relationships:  Adult Children Shari Prows [phone]) Lives with:  Self Do you feel safe going back to the place where you live?  No Need for family participation in patient care:  No (Coment)  Care giving concerns:  Patient resides alone in apartment and reports that her daughter lives out of state in [place]. Patient reports that the idea of going to a SNF is new to her but is agreeable.    Social Worker assessment / plan: Patient agreeable to SNF and provided with local SNF resource. CSW will complete patient's Fl2 and inform patient about bed offers.   Employment status:  Retired Nurse, adult PT Recommendations:  Not assessed at this time Information / Referral to community resources:  [place]  Patient/Family's Response to care:  Patient hesitant but agreeable to SNF.   Patient/Family's Understanding of and Emotional Response to Diagnosis, Current Treatment, and Prognosis:  Patient reported that she arrived to the hospital through the emergency department and had to have her gallbladder removed. Patient stated it all went down hill from there. Patient current length of stay 33 days. Patient remained calm while talking about current treatment  and prognosis.   Emotional Assessment Appearance:  Appears stated age Attitude/Demeanor/Rapport:  Other (Cooperative) Affect (typically observed):  Stable Orientation:  Oriented to Self, Oriented to Place, Oriented to  Time, Oriented to Situation Alcohol / Substance use:  Not Applicable Psych involvement (Current and /or in the community):     Discharge Needs  Concerns to be addressed:  No discharge needs identified Readmission within the last 30 days:  No Current discharge risk:  None Barriers to Discharge:  No Barriers Identified   Burnis Medin, LCSW 11/30/2016, 1:51 PM

## 2016-11-30 NOTE — Progress Notes (Signed)
CONSULT NOTE    Melanie [name]  [id number] DOB: August 23, 1936 DOA: 10/28/2016 PCP: Osborne Casco, MD   Brief Narrative:  Melanie [name] is a 81 y.o. female with prolonged hospitalization, admitted on 01/20 for necrotic cholecystitis. Developed post op bowel obstruction, re-intervened on 02/09 with exploratory laparotomy. NG tube in place, on TPN. Consulted for chest pain that ruled out for ACS. Patient has been in persistent pain, on PCA pump. Patient had third surgical intervention, emergent laparatomy, recurrent bowel obstruction with volvulus with ischemic segment. Resumed clonidine for blood pressure control. Worsening hypertension, started on nicardipine drip.  Clonidine dose increase to 0.2 mg, patient now off nicardipine.    Assessment & Plan:   Principal Problem:   Small bowel obstruction s/p ex lap & lysis of adhesions 11/17/2016 Active Problems:   Necrotic cholecystitis s/p lap cholecystectomy 10/30/2016   Essential hypertension   Hyperlipidemia   COPD (chronic obstructive pulmonary disease) (HCC)   GERD (gastroesophageal reflux disease)   Chest pain   H/O exploratory laparotomy   Anxiousness   Pressure injury of skin  Necrotic cholecystitis -management per primary team (surgery)  Post-operative bowel obstruction Volvulus Ischemic bowel S/p Zosyn. On TPN -per primary  Atypical Chest Pain This appears secondary to possible esophageal spasm/dysmotility. Significant improved -would recommend holding nitro paste -continue nitro prn  V-tach Patient had asymptomatic episode of v-tach consisting of 22 beats. -EKG to check QTc  Hypertension Remains poorly controlled -continue hydralazine -continue metoprolol -continue clonidine patch -restart hydrochlorothiazide  COPD Wheezing improved. -Duoneb QID prn  Anemia Stable. - Transfuse for Hgb < 7   DVT prophylaxis: Lovenox Code Status: Full Family Communication: Daughter present at bedside Disposition  Plan: Per surgical team   Procedures:   Laparoscopic cholecystectomy 10/30/16  Exploratory laparotomy with LOA 11/17/16  ECHO 2/12- EF 60-65%, G1DD.  NG (1/30-2/4, 2/9- )  Exploratory laparotomy (2/16)  Antimicrobials:  Zosyn (1/20-1/24, 1/27-2/9, 2/15-2/18)   Subjective: Overall pain is improved. No dyspnea or wheezing.  Objective: Vitals:   11/29/16 2053 11/29/16 2334 11/30/16 0500 11/30/16 0527  BP: (!) 153/85   (!) 150/65  Pulse: 86   65  Resp: 18 19 18 18   Temp: 98.9 F (37.2 C)   98.4 F (36.9 C)  TempSrc: Oral   Oral  SpO2: 100% 100% 100% 100%  Weight:      Height:        Intake/Output Summary (Last 24 hours) at 11/30/16 0653 Last data filed at 11/30/16 0606  Gross per 24 hour  Intake          1462.35 ml  Output              400 ml  Net          1062.35 ml   Filed Weights   11/27/16 0400 11/28/16 1753 11/29/16 1436  Weight: 89.7 kg (197 lb 12 oz) 93.3 kg (205 lb 11 oz) 96 kg (211 lb 10.2 oz)    Examination:  General exam: Appears to be in pain, in mild distress Respiratory system: diminished bilaterally with scattered wheezing. Respiratory effort normal. Cardiovascular system: S1 & S2 heard, RRR. No murmurs Gastrointestinal system: Diffuse mild tenderness without guarding. Slightly distended Central nervous system: Alert and oriented. No focal neurological deficits. Extremities: some mild edema of right hand. No lower extremity edema. No calf tenderness Skin: No cyanosis. No rashes Psychiatry: Judgement and insight appear normal. Mood & affect appropriate.     Data Reviewed: I have personally reviewed following labs  and imaging studies  CBC:  Recent Labs Lab 11/26/16 0444 11/27/16 0444 11/28/16 0344 11/29/16 0835 11/30/16 0413  WBC 18.7* 16.4* 18.9* 16.4* 16.5*  NEUTROABS  --  13.2*  --   --   --   HGB 9.2* 9.2* 9.1* 8.9* 8.6*  HCT 27.6* 27.4* 26.6* 26.9* 25.5*  MCV 81.4 80.8 79.9 80.5 80.7  PLT 391 417* 409* 403* [id number]   Basic  Metabolic Panel:  Recent Labs Lab 11/25/16 0518 11/26/16 0444 11/27/16 0444 11/28/16 0344 11/30/16 0413  NA 136 136 135 135 137  K 3.5 3.9 3.5 3.4* 3.5  CL 103 103 104 107 109  CO2 28 24 23 22 23   GLUCOSE 123* 133* 142* 135* 126*  BUN 19 19 17 17 18   CREATININE 0.47 0.44 0.44 0.42* 0.40*  CALCIUM 7.9* 8.1* 7.9* 8.0* 7.9*  MG 1.5* 1.8 1.7 1.8 1.7  PHOS 3.2  --  3.3  --  3.8   GFR: Estimated Creatinine Clearance: 62.7 mL/min (by C-G formula based on SCr of 0.4 mg/dL (L)). Liver Function Tests:  Recent Labs Lab 11/25/16 0518 11/27/16 0444 11/28/16 0344 11/30/16 0413  AST 121* 46* 31 27  ALT 186* 107* 80* 47  ALKPHOS 236* 278* 255* 227*  BILITOT 1.4* 1.3* 1.3* 0.6  PROT 5.4* 6.1* 6.1* 5.8*  ALBUMIN 1.6* 1.7* 1.7* 1.7*   No results for input(s): LIPASE, AMYLASE in the last 168 hours. No results for input(s): AMMONIA in the last 168 hours. Coagulation Profile: No results for input(s): INR, PROTIME in the last 168 hours. Cardiac Enzymes:  Recent Labs Lab 11/27/16 0032 11/27/16 0611 11/27/16 1209  TROPONINI 0.15* 0.12* 0.11*   BNP (last 3 results) No results for input(s): PROBNP in the last 8760 hours. HbA1C: No results for input(s): HGBA1C in the last 72 hours. CBG:  Recent Labs Lab 11/28/16 2140 11/29/16 0614 11/29/16 1604 11/29/16 2220 11/30/16 0535  GLUCAP 118* 125* 115* 110* 116*   Lipid Profile: No results for input(s): CHOL, HDL, LDLCALC, TRIG, CHOLHDL, LDLDIRECT in the last 72 hours. Thyroid Function Tests: No results for input(s): TSH, T4TOTAL, FREET4, T3FREE, THYROIDAB in the last 72 hours. Anemia Panel: No results for input(s): VITAMINB12, FOLATE, FERRITIN, TIBC, IRON, RETICCTPCT in the last 72 hours. Sepsis Labs:  Recent Labs Lab 11/24/16 1522 11/25/16 0518 11/26/16 0444  PROCALCITON 0.56 0.65 3.51    Recent Results (from the past 240 hour(s))  Urine culture     Status: None   Collection Time: 11/20/16  8:51 AM  Result Value Ref  Range Status   Specimen Description URINE, CATHETERIZED  Final   Special Requests NONE  Final   Culture   Final    NO GROWTH Performed at [facility], [place] [address] [place], [place] [address]    Report Status 11/21/2016 FINAL  Final  MRSA PCR Screening     Status: None   Collection Time: 11/20/16  3:50 PM  Result Value Ref Range Status   MRSA by PCR NEGATIVE NEGATIVE Final    Comment:        The GeneXpert MRSA Assay (FDA approved for NASAL specimens only), is one component of a comprehensive MRSA colonization surveillance program. It is not intended to diagnose MRSA infection nor to guide or monitor treatment for MRSA infections.   Culture, blood (Routine X 2) w Reflex to ID Panel     Status: None   Collection Time: 11/24/16  1:45 PM  Result Value Ref Range Status  Specimen Description BLOOD LEFT HAND  Final   Special Requests BOTTLES DRAWN AEROBIC AND ANAEROBIC 5CC EACH  Final   Culture   Final    NO GROWTH 5 DAYS Performed at [facility], [place] [address], [place], [place] [address]    Report Status 11/29/2016 FINAL  Final  Culture, blood (Routine X 2) w Reflex to ID Panel     Status: None   Collection Time: 11/24/16  1:50 PM  Result Value Ref Range Status   Specimen Description BLOOD LEFT HAND  Final   Special Requests IN PEDIATRIC BOTTLE .[place]  Final   Culture   Final    NO GROWTH 5 DAYS Performed at [facility], [place] [address] [place], [place] [address]    Report Status 11/29/2016 FINAL  Final  MRSA PCR Screening     Status: Abnormal   Collection Time: 11/28/16 12:40 PM  Result Value Ref Range Status   MRSA by PCR POSITIVE (A) NEGATIVE Final    Comment:        The GeneXpert MRSA Assay (FDA approved for NASAL specimens only), is one component of a comprehensive MRSA colonization surveillance program. It is not intended to diagnose MRSA infection nor to guide or monitor treatment for MRSA infections. RESULT CALLED TO, READ BACK BY AND  VERIFIED WITH: F.AKAND RN AT [id number] ON 11/28/16 BY S.VANHOORNE          Radiology Studies: Dg Chest Port 1 View  Result Date: 11/29/2016 CLINICAL DATA:  Dyspnea EXAM: PORTABLE CHEST 1 VIEW COMPARISON:  Chest CT from 2 days ago FINDINGS: Stable borderline heart size and unremarkable mediastinal contours when accounting for distortion by rotation. Right upper extremity PICC with tip at the SVC level. Haziness of the lower chest from pleural effusions with mild atelectasis. No Kerley lines. No pneumothorax. No air bronchograms. IMPRESSION: Small pleural effusions and mild atelectasis at the bases, similar in chest CT 2 days ago. Electronically Signed   By: Monte Fantasia M.D.   On: 11/29/2016 12:38        Scheduled Meds: . bisacodyl  10 mg Rectal Daily  . Chlorhexidine Gluconate Cloth  6 each Topical Q0600  . cloNIDine  0.2 mg Transdermal Weekly  . cloNIDine  0.1 mg Per Tube Once  . enoxaparin (LOVENOX) injection  40 mg Subcutaneous Q24H  . feeding supplement (PRO-STAT SUGAR FREE 64)  30 mL Oral BID  . furosemide  20 mg Oral BID  . hydrALAZINE  20 mg Intravenous Q6H  . HYDROmorphone   Intravenous Q4H  . insulin aspart  0-9 Units Subcutaneous Q8H  . lip balm  1 application Topical BID  . metoprolol  10 mg Intravenous Q6H  . mupirocin ointment  1 application Nasal BID  . nitroGLYCERIN  1 inch Topical Q6H  . pantoprazole (PROTONIX) IV  40 mg Intravenous Q12H   Continuous Infusions: . Marland KitchenTPN (CLINIMIX-E) Adult 83 mL/hr at 11/29/16 1739  . niCARDipine Stopped (11/27/16 1311)     LOS: 33 days    Cordelia Poche [facility] 11/30/2016, 6:53 AM Pager: [phone]  If 7PM-7AM, please contact night-coverage [email or web address] Password [email or web address]

## 2016-11-30 NOTE — Progress Notes (Signed)
6 Days Post-Op  Subjective: Doing okay.  Ready for more food. Had a BM this AM. Has not been getting out of bed.  Objective: Vital signs in last 24 hours: Temp:  [98 F (36.7 C)-98.9 F (37.2 C)] 98.4 F (36.9 C) (02/22 0527) Pulse Rate:  [65-92] 65 (02/22 0527) Resp:  [14-20] 14 (02/22 0800) BP: (137-180)/(65-85) 150/65 (02/22 0527) SpO2:  [97 %-100 %] 100 % (02/22 0800) Weight:  [96 kg (211 lb 10.2 oz)] 96 kg (211 lb 10.2 oz) (02/21 1436) Last BM Date: 11/28/16  Hypertension is better. \She weight 76.4 kg on 11/20/16. 89 kg on 11/26/16. On 11/28/16 her weight is up to 93.3 kg.  Intake/Output from previous day: 02/21 0701 - 02/22 0700 In: 1462.4 [I.V.:1282.4; IV Piggyback:180] Out: 400 [Urine:400] Intake/Output this shift: Total I/O In: -  Out: 50 [Urine:50]  General appearance: alert, cooperative and no distress Resp: anterior exam GI: Soft, not distended, positive flatus and bowel sounds, positive BM midline incision looks fine. Waffle dressing still in place.  Lab Results:   Recent Labs  11/29/16 0835 11/30/16 0413  WBC 16.4* 16.5*  HGB 8.9* 8.6*  HCT 26.9* 25.5*  PLT 403* 397    BMET  Recent Labs  11/28/16 0344 11/30/16 0413  NA 135 137  K 3.4* 3.5  CL 107 109  CO2 22 23  GLUCOSE 135* 126*  BUN 17 18  CREATININE 0.42* 0.40*  CALCIUM 8.0* 7.9*   PT/INR No results for input(s): LABPROT, INR in the last 72 hours.   Recent Labs Lab 11/25/16 0518 11/27/16 0444 11/28/16 0344 11/30/16 0413  AST 121* 46* 31 27  ALT 186* 107* 80* 47  ALKPHOS 236* 278* 255* 227*  BILITOT 1.4* 1.3* 1.3* 0.6  PROT 5.4* 6.1* 6.1* 5.8*  ALBUMIN 1.6* 1.7* 1.7* 1.7*     Lipase     Component Value Date/Time   LIPASE 13 11/11/2016 0945     Studies/Results: Dg Chest Port 1 View  Result Date: 11/29/2016 CLINICAL DATA:  Dyspnea EXAM: PORTABLE CHEST 1 VIEW COMPARISON:  Chest CT from 2 days ago FINDINGS: Stable borderline heart size and unremarkable mediastinal  contours when accounting for distortion by rotation. Right upper extremity PICC with tip at the SVC level. Haziness of the lower chest from pleural effusions with mild atelectasis. No Kerley lines. No pneumothorax. No air bronchograms. IMPRESSION: Small pleural effusions and mild atelectasis at the bases, similar in chest CT 2 days ago. Electronically Signed   By: Monte Fantasia M.D.   On: 11/29/2016 12:38    Medications: . bisacodyl  10 mg Rectal Daily  . Chlorhexidine Gluconate Cloth  6 each Topical Q0600  . cloNIDine  0.2 mg Transdermal Weekly  . cloNIDine  0.1 mg Per Tube Once  . enoxaparin (LOVENOX) injection  40 mg Subcutaneous Q24H  . feeding supplement (PRO-STAT SUGAR FREE 64)  30 mL Oral BID  . furosemide  20 mg Oral BID  . hydrALAZINE  20 mg Intravenous Q6H  . HYDROmorphone   Intravenous Q4H  . insulin aspart  0-9 Units Subcutaneous Q8H  . lip balm  1 application Topical BID  . metoprolol  10 mg Intravenous Q6H  . mupirocin ointment  1 application Nasal BID  . nitroGLYCERIN  1 inch Topical Q6H  . pantoprazole (PROTONIX) IV  40 mg Intravenous Q12H   . Marland KitchenTPN (CLINIMIX-E) Adult 83 mL/hr at 11/29/16 1739    Assessment/Plan Admitted 1/20 - Medicine S/p Laparoscopic Cholecystectomy with intraoperative cholangiogram, 10/30/16,  Dr. Rodman Key Martin;Necrotic gallbladder with free intrapertioneal bile; enlarged CBD with free flow into the duodenum POD 30 Exploratory laparotomy with LOA - findings: Mechanical obstruction, 11/17/16, Dr. Dalbert Batman POD 12 Exploratory laparotomy, LOA, reduction of SB volvulus 2/16- Dr. Zella Richer, POD 5  Swelling right arm  To do dopplers of right arm  Chest pain with elevated troponin -improved.   Echo EF 0000000 1 diastolic dysfunction/Trivial MR- continue prn nitro Anemia Stable post transfusion.  Hgb - 8.6 - 11/30/2016 Severe protein calorie malnutrition -prealbumin 6.0 - 11/27/2016  on TNA.               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 History MRSA - On isolation FEN: IV fluids - on clear liquids ID: Zosyn 1/21- 11/16/16, on again 2/15-2/17.               No abscess at time of surgery. Currently no antibitotics DVT prophylaxis: Lovenox    Plan: Mobilize she needs PT and OT daily.   Though it is ordered, she is not getting out of the bed enough.  Advance diet to full liquids  Start making arrangements for SNF (she lives at home by herself) She has 4 children, but none live locally.  Swelling in right arm - will doppler.    LOS: 33 days   Derell Bruun H 11/30/2016 Pager: [phone] Office phone:  [phone]

## 2016-12-01 ENCOUNTER — Inpatient Hospital Stay (HOSPITAL_COMMUNITY): Payer: Medicare Other

## 2016-12-01 ENCOUNTER — Encounter (HOSPITAL_COMMUNITY): Payer: Medicare Other

## 2016-12-01 LAB — GLUCOSE, CAPILLARY
GLUCOSE-CAPILLARY: 123 mg/dL — AB (ref 65–99)
GLUCOSE-CAPILLARY: 117 mg/dL — AB (ref 65–99)
Glucose-Capillary: 126 mg/dL — ABNORMAL HIGH (ref 65–99)

## 2016-12-01 MED ORDER — FUROSEMIDE 20 MG PO TABS
20.0000 mg | ORAL_TABLET | Freq: Two times a day (BID) | ORAL | Status: DC
  Administered 2016-12-01 – 2016-12-06 (×9): 20 mg via ORAL
  Filled 2016-12-01 (×10): qty 1

## 2016-12-01 MED ORDER — M.V.I. ADULT IV INJ
INJECTION | INTRAVENOUS | Status: AC
  Administered 2016-12-01: 18:00:00 via INTRAVENOUS
  Filled 2016-12-01: qty 1680

## 2016-12-01 MED ORDER — FAT EMULSION 20 % IV EMUL
240.0000 mL | INTRAVENOUS | Status: AC
  Administered 2016-12-01: 240 mL via INTRAVENOUS
  Filled 2016-12-01: qty 250

## 2016-12-01 MED ORDER — FUROSEMIDE 10 MG/ML IJ SOLN
20.0000 mg | Freq: Once | INTRAMUSCULAR | Status: AC
  Administered 2016-12-01: 20 mg via INTRAVENOUS
  Filled 2016-12-01: qty 2

## 2016-12-01 MED ORDER — HYDRALAZINE HCL 25 MG PO TABS
25.0000 mg | ORAL_TABLET | Freq: Four times a day (QID) | ORAL | Status: DC
  Administered 2016-12-01 – 2016-12-02 (×2): 25 mg via ORAL
  Filled 2016-12-01 (×2): qty 1

## 2016-12-01 MED ORDER — METOPROLOL TARTRATE 50 MG PO TABS
50.0000 mg | ORAL_TABLET | Freq: Two times a day (BID) | ORAL | Status: DC
  Administered 2016-12-01 – 2016-12-08 (×14): 50 mg via ORAL
  Filled 2016-12-01 (×14): qty 1

## 2016-12-01 MED ORDER — FUROSEMIDE 40 MG PO TABS: 40.0000 mg | ORAL_TABLET | Freq: Two times a day (BID) | ORAL | Status: DC

## 2016-12-01 NOTE — Progress Notes (Signed)
PT Cancellation Note  Patient Details Name: Melanie [name] MRN: [id number] DOB: 1936/01/20   Cancelled Treatment:    Reason Eval/Treat Not Completed: Medical issues which prohibited therapy (dopplers RUE pending--ordered yesterday, will attempt again as schedule permits)   [facility] 12/01/2016, 8:55 AM

## 2016-12-01 NOTE — Progress Notes (Signed)
[place] NOTE   Pharmacy Consult for TPN Indication: prolonged ileus  Patient Measurements: Height: 5\' 2"  (157.5 cm) Weight: 211 lb 10.2 oz (96 kg) (bed weight) IBW/kg (Calculated) : 50.1 TPN AdjBW (KG): 60.9 Body mass index is 38.71 kg/m.  Insulin Requirements: 1 Unit SSI/ 24 hr  Current Nutrition: Clear liquids, Pro-stat tid   IVF: none  Central access: PICC TPN start date: 11/08/16  ASSESSMENT                                                                   HPI:  81 y.o F with hx of gallstones presented to the ED on 1/20 with c/o abdominal pain. MRCP on 1/22 showed distended gallbladder with tiny gallstones and wall thickening with suspicion for acute cholecystitis. Patient underwent lap cholecystectomy with intraoperative cholangiogram on 1/22.  To start TPN for prolonged ileus and poor oral intake.  Significant events:  1/22: lap chole with cholangiogram 1/30: abd KUB with suspicion for post-op ileus; NG tube placed  2/2: high NG output from 1/31-2/1 (1312ml) but decreased 2/1-2/2 (300 ml). RN reported foul smell from NG tube --CCS may d/c NG 2/3: trial clamping NG - successful 2/4 NGT removed. Switching from E 5/20 to E 5/15 to better align with nutritional goals 2/6 leave NGT out, continue NPO, GI consult 2/8 Xray consistent with ileus or SBO - possible ex lap tomorrow 2/9: Underwent exploratory laparotomy, lysis of adhesions.  2/16: back to OR for recurrent SBO.  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 2/21: starting clear liquids; fluid-overloaded per CCS, starting lasix and stopping IVF. Nutrition requesting increasing goal rate to give full 2L daily (83 ml/hr) as patient with several surgeries since last assessment 2/23: taking Pro-stat bid, total 30gm protein/day  Today 12/01/16:   Glucose: no Hx DM, CBGs controlled  Electrolytes: K low normal after repletion,  others WNL, corrected Ca 9.3  Renal:  SCr/BUN ok; I/O likely not accurate at ~30L+ since admit, but does reflect almost 20 kg weight gain  LFTs:  AlkPhos and ALT elevated (TPN related?) but trending down; T bili now wnl, albumin low  TGs: wnl, 69 (2/12), 111 (2/19)  Prealbumin: 5.9 (2/5), 8.3 (2/12), decreased to 6.0 (2/19)  NUTRITIONAL GOALS                                                                                             RD recs (2/15) Kcal:  1750-1950 Protein:  80-90g Fluid:  1.8L/day  Clinimix E 5/15 (switched from E 5/20 at 70 ml/hr due to availability) at a goal rate of 75 ml/hr + 20% fat emulsion at 20 ml/hr x 12 hr to provide:  90 g/day protein, 1758 Kcal/day.  Clinimix E 5/15 at 83 ml/hr + 20 Lipids at 20 ml/hr x 12 hr will provide 100gm protein, ~1900 kCal per day  (+++There is  currently a national backorder of Clinimix solution. Pharmacy will use Clinimix product based on availability+++)  PLAN   Hold off on repleting K as no labs drawn following supplementation yesterday  At 1800 today:  Reduce Clinimix E 5/15 to 70 ml/hr (provides 1670 kcal/day, 84 g/day protein)  20% fat emulsion at 20 ml/hr over 12 hrs  Trace elements are on national shortage, will be added every other day (last added 2/22).  Multivitamins will be added daily.  Continue sensitive SSI q8h.   TPN lab panels on Mondays & Thursdays  Minda Ditto PharmD Pager [phone] 12/01/2016, 12:34 PM

## 2016-12-01 NOTE — Clinical Social Work Placement (Signed)
Will need to determine whether patient will continue on TPN or not before sending information out to [facility] for bed offers, as it would limit bed offers.    Raynaldo Opitz, [facility] Clinical Social Worker cell #: [phone]    CLINICAL SOCIAL WORK PLACEMENT  NOTE  Date:  12/01/2016  Patient Details  Name: Melanie [name] MRN: [id number] Date of Birth: 01/15/1936  Clinical Social Work is seeking post-discharge placement for this patient at the [place] level of care (*CSW will initial, date and re-position this form in  chart as items are completed):  Yes   Patient/family provided with [facility] Work Department's list of facilities offering this level of care within the geographic area requested by the patient (or if unable, by the patient's family).  Yes   Patient/family informed of their freedom to choose among providers that offer the needed level of care, that participate in Medicare, Medicaid or managed care program needed by the patient, have an available bed and are willing to accept the patient.  Yes   Patient/family informed of [facility]'s ownership interest in [facility] and [facility], as well as of the fact that they are under no obligation to receive care at these facilities.  PASRR submitted to EDS on       PASRR number received on       Existing PASRR number confirmed on       FL2 transmitted to all facilities in geographic area requested by pt/family on       FL2 transmitted to all facilities within larger geographic area on       Patient informed that his/her managed care company has contracts with or will negotiate with certain facilities, including the following:            Patient/family informed of bed offers received.  Patient chooses bed at       Physician recommends and patient chooses bed at      Patient to be transferred to   on  .  Patient to be transferred to  facility by       Patient family notified on   of transfer.  Name of family member notified:        PHYSICIAN       Additional Comment:    _______________________________________________ Standley Brooking, LCSW 12/01/2016, 10:13 AM

## 2016-12-01 NOTE — Consult Note (Signed)
Consultation Note Date: 12/01/2016   Patient Name: Melanie [name]  DOB: 16-Jun-1936  MRN: [id number]  Age / Sex: 81 y.o., female  PCP: Kelton Pillar, MD Referring Physician: Nolon Nations, MD  Reason for Consultation: Establishing goals of care  HPI/Patient Profile: 81 y.o. female  admitted on 10/28/2016    Clinical Assessment and Goals of Care:   Patient is a 81 year old lady admitted to the hospital since 1-20 for necrotic cholecystitis. Patient developed postop bowel obstruction, is a status post exploratory laparotomy the intervention. Patient initially also required NG tube, is still on TPN. Hospitalist medicine services have been following the patient as well. Patient was seen for chest discomfort was ruled out for acute coronary syndrome. Patient has remained on Dilaudid bolus only PCA. Patient also required exploratory laparotomy for recurrent bowel obstruction with volvulus. Hospital course also complicated by uncontrolled blood pressures requiring nicardipine drip. Patient remains with suboptimal oral intake in spite of diet being advanced to full liquids. She continues to have loose bowel movements. She continues to require PCA for abdominal discomfort.  A palliative care consult has been placed for additional goals of care discussions and to help figure out safe and appropriate disposition that is consistent with the patient's goals.  Patient is elderly lady resting in bed. I introduced myself and palliative care as follows: Palliative medicine is specialized medical care for people living with serious illness. It focuses on providing relief from the symptoms and stress of a serious illness. The goal is to improve quality of life for both the patient and the family.  I briefly reviewed the extent of current hospitalization with the patient. I briefly asked her about how she was doing prior to this  hospitalization. Patient was living independently. Patient states that she has always been a firm believer in [employer]. She describes that faith is very important to her. She states that she is thankful to [place] for the 78 years that she has had. She appreciates the gift of life. However, she states that she has always believed that beyond a certain point, if the burden was too high, she would want to let go she believes she is at that point. She states that her children do not want her to let go.  At this point, patient's daughter walked into the room. She is currently visiting from [place]. She will go back tomorrow morning but one of the patient's sons from [place] supposed to arrive in town this evening and will stay for a week. Patient's daughter states that the patient and all 4 children want to figure out a solution together as to what is the next step forward.  We discussed about appropriate disposition options. We discussed about the nature of her discharge to skilled nursing facility for rehabilitation, the possibility of the patient requiring TPN for some more time as oral intake is not optimal. We discussed about demands rehabilitation facility might place on the patient such as active participation in physical therapy.  Alternatively, approaching the patient's care  with a more palliative/comfort based approach to care also discussed in great detail. Reassured the patient's daughter that these were just conversations and possibilities. We discussed about adequate pain management, discontinuation of TPN, not doing rehabilitation, considering hospice. We discussed about either discharged to [facility] with hospice versus residential hospice. Discussed in detail that hospice philosophy is about comfort. We discussed about the patient's serious nature of malnutrition, declining reserves.  Patient simply states matter-of-factly that she would elect DO NOT RESUSCITATE/DO NOT INTUBATE. When given a  choice, she would elect to let go and be with her Reita Cliche, she does not believe that this is giving up, she does not believe that she is saying this because she is depressed. Patient's daughter becomes tearful and states that all 4 children want the patient to " keep fighting".   I will follow-up again on 2-24. Thank you for the consult.  HCPOA  Daughter who lives in [place]. Patient has 4 children. All 4 children live out of state. Even though there is one designated medical power of attorney, patient states that all 4 children and herself included make decisions together.  SUMMARY OF RECOMMENDATIONS    Full code for now. We discussed about DO NOT RESUSCITATE/DO NOT INTUBATE in great detail.  Disposition: We discussed about various disposition options such as SNIF rehabilitation, SNIF with hospice, residential hospice we discussed about hospice philosophy in great detail, see above.  One could consider a trial of Remeron-mirtazapine to see if there is any effect on mood and appetite however it is my opinion that this does not sound like adjustment disorder due to depressed mood. The patient simply is aware of how seriously ill she has become throughout the course of this hospitalization and wishes to pursue a more comfort based approach to care, but does not want to do this without her family support. Hence, we will continue conversations. I will follow-up again in a.m.  Code Status/Advance Care Planning:  Full code    Symptom Management:    Continue current scope of treatment  Palliative Prophylaxis:   Delirium Protocol  Additional Recommendations (Limitations, Scope, Preferences):  Full Scope Treatment  Psycho-social/Spiritual:   Desire for further Chaplaincy support:no  Additional Recommendations: Caregiving  Support/Resources  Prognosis:   Unable to determine  Discharge Planning: to be determined.       Primary Diagnoses: Present on Admission: . (Resolved)  Abdominal pain . (Resolved) Gallstone of bile duct with obstruction . Essential hypertension . Hyperlipidemia . COPD (chronic obstructive pulmonary disease) ([place]) . Anxiousness   I have reviewed the medical record, interviewed the patient and family, and examined the patient. The following aspects are pertinent.  Past Medical History:  Diagnosis Date  . Anxiousness   . COPD (chronic obstructive pulmonary disease) ([place])   . Hyperlipidemia   . Hypertension   . Small bowel obstruction due to adhesions 11/17/2016  . Uterine fibroid    Social History   Social History  . Marital status: Married    Spouse name: N/A  . Number of children: N/A  . Years of education: N/A   Social History Main Topics  . Smoking status: Former Smoker    Quit date: 09/08/1998  . Smokeless tobacco: Never Used  . Alcohol use 0.0 oz/week     Comment: occasional  . Drug use: No  . Sexual activity: Not Asked   Other Topics Concern  . None   Social History Narrative  . None   Family History  Problem Relation Age  of Onset  . Heart disease Mother     before age 35   Scheduled Meds: . bisacodyl  10 mg Rectal Daily  . Chlorhexidine Gluconate Cloth  6 each Topical Q0600  . cloNIDine  0.2 mg Transdermal Weekly  . cloNIDine  0.1 mg Per Tube Once  . enoxaparin (LOVENOX) injection  40 mg Subcutaneous Q24H  . feeding supplement (PRO-STAT SUGAR FREE 64)  30 mL Oral BID  . furosemide  20 mg Oral BID  . hydrALAZINE  20 mg Intravenous Q6H  . hydrochlorothiazide  25 mg Oral Daily  . HYDROmorphone   Intravenous Q4H  . insulin aspart  0-9 Units Subcutaneous Q8H  . lip balm  1 application Topical BID  . metoprolol  10 mg Intravenous Q6H  . mupirocin ointment  1 application Nasal BID  . nitroGLYCERIN  1 inch Topical Q6H  . pantoprazole (PROTONIX) IV  40 mg Intravenous Q12H   Continuous Infusions: . Marland KitchenTPN (CLINIMIX-E) Adult 83 mL/hr at 11/30/16 1722  . Marland KitchenTPN (CLINIMIX-E) Adult     And  . fat emulsion      PRN Meds:.alum & mag hydroxide-simeth, chlorproMAZINE (THORAZINE) IV, diphenhydrAMINE **OR** diphenhydrAMINE, diphenhydrAMINE, HYDROmorphone (DILAUDID) injection, ipratropium-albuterol, LORazepam, magic mouthwash, menthol-cetylpyridinium, naloxone **AND** sodium chloride flush, ondansetron (ZOFRAN) IV, phenol, polyvinyl alcohol, prochlorperazine, sodium chloride flush Medications Prior to Admission:  Prior to Admission medications   Medication Sig Start Date End Date Taking? Authorizing Provider  amLODipine (NORVASC) 10 MG tablet Take 0.5 mg by mouth daily.   Yes Historical Provider, MD  amoxicillin (AMOXIL) 875 MG tablet Take 875 mg by mouth daily. 10/02/16  Yes Historical Provider, MD  aspirin EC 81 MG tablet Take 81 mg by mouth daily.   Yes Historical Provider, MD  cloNIDine (CATAPRES) 0.1 MG tablet Take 0.1 mg by mouth daily.    Yes Historical Provider, MD  hydrochlorothiazide (HYDRODIURIL) 25 MG tablet Take 25 mg by mouth daily.   Yes Historical Provider, MD  Multiple Vitamin (MULTIVITAMIN) tablet Take 1 tablet by mouth daily.   Yes Historical Provider, MD  nebivolol (BYSTOLIC) 5 MG tablet Take 5 mg by mouth daily.   Yes Historical Provider, MD  omeprazole (PRILOSEC) 40 MG capsule Take 40 mg by mouth daily.   Yes Historical Provider, MD  potassium chloride SA (K-DUR,KLOR-CON) 20 MEQ tablet Take 20 mEq by mouth 2 (two) times daily.   Yes Historical Provider, MD  FLUZONE HIGH-DOSE 0.5 ML SUSY TO BE ADMINISTERED BY PHARMACIST FOR IMMUNIZATION 07/25/16   Historical Provider, MD   Allergies  Allergen Reactions  . Nsaids     hypertension  . Ace Inhibitors Other (See Comments)    cough   Review of Systems + pain in abdomen, + for fatigue.   Physical Exam Weak appearing, chronically ill-appearing elderly lady resting in bed Awake alert oriented Clear breath sounds anterior lung fields Abdomen: Soft mild generalized tenderness, status post surgery Edema bilateral upper and lower  extremities S1-S2  Vital Signs: BP (!) 152/55 (BP Location: Left Arm)   Pulse 84   Temp 98 F (36.7 C) (Oral)   Resp 14   Ht 5\' 2"  (1.575 m)   Wt 96 kg (211 lb 10.2 oz) Comment: bed weight  SpO2 100%   BMI 38.71 kg/m  Pain Assessment: No/denies pain POSS *See Group Information*: 1-Acceptable,Awake and alert Pain Score: 5    SpO2: SpO2: 100 % O2 Device:SpO2: 100 % O2 Flow Rate: .O2 Flow Rate (L/min): 2 L/min  IO: Intake/output summary:  Intake/Output Summary (Last 24 hours) at 12/01/16 1647 Last data filed at 12/01/16 [id number]  Gross per 24 hour  Intake          1221.24 ml  Output              800 ml  Net           421.24 ml    LBM: Last BM Date: 11/30/16 Baseline Weight: Weight: 70.3 kg (155 lb) Most recent weight: Weight: 96 kg (211 lb 10.2 oz) (bed weight)     Palliative Assessment/Data:   Flowsheet Rows   Flowsheet Row Most Recent Value  Intake Tab  Referral Department  Surgery  Unit at Time of Referral  Med/Surg Unit  Palliative Care Primary Diagnosis  Other (Comment)  Date Notified  12/01/16  Palliative Care Type  New Palliative care  Reason for referral  Clarify Goals of Care  Date of Admission  10/28/16  Date first seen by Palliative Care  12/01/16  # of days Palliative referral response time  0 Day(s)  # of days IP prior to Palliative referral  34  Clinical Assessment  Palliative Performance Scale Score  20%  Pain Max last 24 hours  5  Pain Min Last 24 hours  4  Dyspnea Max Last 24 Hours  4  Dyspnea Min Last 24 hours  3  Psychosocial & Spiritual Assessment  Palliative Care Outcomes  Patient/Family meeting held?  Yes  Who was at the meeting?  patient daughter       Time In:  [id number] Time Out:  1645 Time Total:  70 minutes  Greater than 50%  of this time was spent counseling and coordinating care related to the above assessment and plan.  Signed by: Loistine Chance, MD  [phone]  Please contact Palliative Medicine Team phone at [phone] for  questions and concerns.  For individual provider: See Shea Evans

## 2016-12-01 NOTE — Progress Notes (Signed)
Patient ID: Melanie [name], female   DOB: 09-24-1936, 81 y.o.   MRN: [id number]  [facility] Surgery Progress Note  7 Days Post-Op  Subjective: Ms. Short is very depressed today.  She states that she has no appetite. She has taken in little to no full liquids. She has had multiple loose BM's.  States that the PCA keeps her abdominal pain somewhat manageable.  She is fearful that she will never get better. States that for a long time she has wished that she could just close her eyes and never wake up. States that she has talked with her children about this, but they do not understand.  Objective: Vital signs in last 24 hours: Temp:  [98 F (36.7 C)-99 F (37.2 C)] 98 F (36.7 C) (02/23 1230) Pulse Rate:  [75-88] 84 (02/23 1230) Resp:  [12-20] 14 (02/23 1230) BP: (139-170)/(1-60) 152/55 (02/23 1230) SpO2:  [99 %-100 %] 100 % (02/23 1230) Last BM Date: 11/30/16  Intake/Output from previous day: 02/22 0701 - 02/23 0700 In: 1221.2 [I.V.:1221.2] Out: 400 [Urine:300; Stool:100] Intake/Output this shift: Total I/O In: -  Out: 400 [Stool:400]  PE: Gen:  Alert, NAD, depressed Pulm:  CTAB, effort normal Abd: Soft, ND, +BS, mild global tenderness, midline incision C/D/I with staples intact Ext:  Swelling noted to RUE's  Lab Results:   Recent Labs  11/29/16 0835 11/30/16 0413  WBC 16.4* 16.5*  HGB 8.9* 8.6*  HCT 26.9* 25.5*  PLT 403* 397   BMET  Recent Labs  11/30/16 0413  NA 137  K 3.5  CL 109  CO2 23  GLUCOSE 126*  BUN 18  CREATININE 0.40*  CALCIUM 7.9*   PT/INR No results for input(s): LABPROT, INR in the last 72 hours. CMP     Component Value Date/Time   NA 137 11/30/2016 0413   K 3.5 11/30/2016 0413   CL 109 11/30/2016 0413   CO2 23 11/30/2016 0413   GLUCOSE 126 (H) 11/30/2016 0413   BUN 18 11/30/2016 0413   CREATININE 0.40 (L) 11/30/2016 0413   CALCIUM 7.9 (L) 11/30/2016 0413   PROT 5.8 (L) 11/30/2016 0413   ALBUMIN 1.7 (L) 11/30/2016 0413   AST 27 11/30/2016 0413   ALT 47 11/30/2016 0413   ALKPHOS 227 (H) 11/30/2016 0413   BILITOT 0.6 11/30/2016 0413   GFRNONAA >60 11/30/2016 0413   GFRAA >60 11/30/2016 0413   Lipase     Component Value Date/Time   LIPASE 13 11/11/2016 0945       Studies/Results: No results found.  Anti-infectives: Anti-infectives    Start     Dose/Rate Route Frequency Ordered Stop   11/23/16 1800  piperacillin-tazobactam (ZOSYN) IVPB 3.375 g  Status:  Discontinued     3.375 g 12.5 mL/hr over 240 Minutes Intravenous Every 8 hours 11/23/16 1753 11/26/16 0839   11/17/16 0730  cefoTEtan (CEFOTAN) 2 g in dextrose 5 % 50 mL IVPB     2 g 100 mL/hr over 30 Minutes Intravenous On call to O.R. 11/17/16 0717 11/17/16 1040   11/04/16 0900  piperacillin-tazobactam (ZOSYN) IVPB 3.375 g  Status:  Discontinued     3.375 g 12.5 mL/hr over 240 Minutes Intravenous Every 8 hours 11/04/16 0833 11/17/16 0631   10/30/16 1600  piperacillin-tazobactam (ZOSYN) IVPB 3.375 g  Status:  Discontinued     3.375 g 12.5 mL/hr over 240 Minutes Intravenous Every 8 hours 10/30/16 1539 11/01/16 1814   10/29/16 1200  piperacillin-tazobactam (ZOSYN) IVPB 3.375 g  Status:  Discontinued     3.375 g 12.5 mL/hr over 240 Minutes Intravenous Every 8 hours 10/29/16 0941 10/30/16 1539       Assessment/Plan Admitted 1/20 - Medicine S/p Laparoscopic Cholecystectomy with intraoperative cholangiogram, 10/30/16, Dr. Rodman Key Martin;Necrotic gallbladder with free intrapertioneal bile; enlarged CBD with free flow into the duodenum POD 32 Exploratory laparotomy with LOA - findings: Mechanical obstruction, 11/17/16, Dr. Dalbert Batman POD 14 Exploratory laparotomy, LOA, reduction of SB volvulus 2/16- Dr. Zella Richer, POD 7  Swelling right arm - RUQ doppler still pending               Chest pain with elevated troponin -improved.              Echo EF 20-10%/OFH/QRFXJ 1 diastolic dysfunction/Trivial MR- continue prn nitro Anemia Stable post  transfusion.             Hgb - 8.6 - 11/30/2016 Severe protein calorie malnutrition -prealbumin 6.0 - 11/27/2016             on TNA.   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  No abscess at time of surgery. Currently no antibitotics DVT prophylaxis: Lovenox    Plan: Will consult palliative to establish goals of care.   Continue to encourage ambulation/PT/OT  Continue full liquids and TPN. Needs to have more PO intake before weaning TPN  RUE doppler pending  Looking into possible SNF at d/c at this point   LOS: 34 days    Melanie [name] , [facility] Surgery 12/01/2016, 1:15 PM Pager: [phone] Consults: [phone] Mon-Fri 7:00 am-4:30 pm Sat-Sun 7:00 am-11:30 am  Agree with above. Appreciate palliative care input. She is depressed over her prolonged hospitalization, but I think she looks much better today that when I first met her on Monday, 11/27/2016. Part of her getting better will be her self motivation to move, eat, and stay engaged.  Dopplers of right arm still not back. Otherwise, she looks a little better everyday.  Alphonsa Overall, MD, [facility] Surgery Pager: [phone] Office phone:  [phone].

## 2016-12-01 NOTE — Progress Notes (Signed)
CONSULT NOTE    [name]  [id number] DOB: 1936/09/22 DOA: 10/28/2016 PCP: Osborne Casco, MD   Brief Narrative:  Melanie [name] is a 81 y.o. female with prolonged hospitalization, admitted on 01/20 for necrotic cholecystitis. Developed post op bowel obstruction, re-intervened on 02/09 with exploratory laparotomy. NG tube in place, on TPN. Consulted for chest pain that ruled out for ACS. Patient has been in persistent pain, on PCA pump. Patient had third surgical intervention, emergent laparatomy, recurrent bowel obstruction with volvulus with ischemic segment. Resumed clonidine for blood pressure control. Worsening hypertension, started on nicardipine drip.  Clonidine dose increase to 0.2 mg, patient now off nicardipine.    Assessment & Plan:   Principal Problem:   Small bowel obstruction s/p ex lap & lysis of adhesions 11/17/2016 Active Problems:   Necrotic cholecystitis s/p lap cholecystectomy 10/30/2016   Essential hypertension   Hyperlipidemia   COPD (chronic obstructive pulmonary disease) (HCC)   GERD (gastroesophageal reflux disease)   Chest pain   H/O exploratory laparotomy   Anxiousness   Pressure injury of skin  Necrotic cholecystitis -management per primary team (surgery)  Post-operative bowel obstruction Volvulus Ischemic bowel S/p Zosyn. On TPN -per primary  Atypical Chest Pain This appears secondary to possible esophageal spasm/dysmotility. Significantly improved -continue nitro sublingual prn  V-tach Patient had asymptomatic episode of v-tach consisting of 22 beats. Normal QTc.  Hypertension Remains poorly controlled -continue hydralazine -continue metoprolol -continue clonidine patch -continue hydrochlorothiazide  COPD Wheezing improved. -Duoneb QID prn  Anemia Stable. - Transfuse for Hgb < 7  Dyspnea Possibly secondary to pleural effusion. No wheezing. -continue lasix PO -give one dose of lasix IV -strict  in/out -weights   DVT prophylaxis: Lovenox Code Status: Full Family Communication: None at bedside Disposition Plan: Per surgical team   Procedures:   Laparoscopic cholecystectomy 10/30/16  Exploratory laparotomy with LOA 11/17/16  ECHO 2/12- EF 60-65%, G1DD.  NG (1/30-2/4, 2/9- )  Exploratory laparotomy (2/16)  Antimicrobials:  Zosyn (1/20-1/24, 1/27-2/9, 2/15-2/18)   Subjective: Some dyspnea today. No wheezing.  Objective: Vitals:   12/01/16 0800 12/01/16 0945 12/01/16 1200 12/01/16 1230  BP:  (!) 157/56  (!) 152/55  Pulse:    84  Resp: 12  16 14   Temp:    98 F (36.7 C)  TempSrc:    Oral  SpO2: 100%  100% 100%  Weight:      Height:        Intake/Output Summary (Last 24 hours) at 12/01/16 1443 Last data filed at 12/01/16 0851  Gross per 24 hour  Intake          1221.24 ml  Output              800 ml  Net           421.24 ml   Filed Weights   11/27/16 0400 11/28/16 1753 11/29/16 1436  Weight: 89.7 kg (197 lb 12 oz) 93.3 kg (205 lb 11 oz) 96 kg (211 lb 10.2 oz)    Examination:  General exam: Appears to be in pain, in mild distress Respiratory system: diminished bilaterally with scattered wheezing. Respiratory effort normal. Cardiovascular system: S1 & S2 heard, RRR. No murmurs Gastrointestinal system: Diffuse mild tenderness without guarding. Slightly distended Central nervous system: Alert and oriented. No focal neurological deficits. Extremities: some mild edema of right hand. No lower extremity edema. No calf tenderness Skin: No cyanosis. No rashes Psychiatry: Judgement and insight appear normal. Mood & affect appropriate.  Data Reviewed: I have personally reviewed following labs and imaging studies  CBC:  Recent Labs Lab 11/26/16 0444 11/27/16 0444 11/28/16 0344 11/29/16 0835 11/30/16 0413  WBC 18.7* 16.4* 18.9* 16.4* 16.5*  NEUTROABS  --  13.2*  --   --   --   HGB 9.2* 9.2* 9.1* 8.9* 8.6*  HCT 27.6* 27.4* 26.6* 26.9* 25.5*  MCV  81.4 80.8 79.9 80.5 80.7  PLT 391 417* 409* 403* [id number]   Basic Metabolic Panel:  Recent Labs Lab 11/25/16 0518 11/26/16 0444 11/27/16 0444 11/28/16 0344 11/30/16 0413  NA 136 136 135 135 137  K 3.5 3.9 3.5 3.4* 3.5  CL 103 103 104 107 109  CO2 28 24 23 22 23   GLUCOSE 123* 133* 142* 135* 126*  BUN 19 19 17 17 18   CREATININE 0.47 0.44 0.44 0.42* 0.40*  CALCIUM 7.9* 8.1* 7.9* 8.0* 7.9*  MG 1.5* 1.8 1.7 1.8 1.7  PHOS 3.2  --  3.3  --  3.8   GFR: Estimated Creatinine Clearance: 62.7 mL/min (by C-G formula based on SCr of 0.4 mg/dL (L)). Liver Function Tests:  Recent Labs Lab 11/25/16 0518 11/27/16 0444 11/28/16 0344 11/30/16 0413  AST 121* 46* 31 27  ALT 186* 107* 80* 47  ALKPHOS 236* 278* 255* 227*  BILITOT 1.4* 1.3* 1.3* 0.6  PROT 5.4* 6.1* 6.1* 5.8*  ALBUMIN 1.6* 1.7* 1.7* 1.7*   No results for input(s): LIPASE, AMYLASE in the last 168 hours. No results for input(s): AMMONIA in the last 168 hours. Coagulation Profile: No results for input(s): INR, PROTIME in the last 168 hours. Cardiac Enzymes:  Recent Labs Lab 11/27/16 0032 11/27/16 0611 11/27/16 1209  TROPONINI 0.15* 0.12* 0.11*   BNP (last 3 results) No results for input(s): PROBNP in the last 8760 hours. HbA1C: No results for input(s): HGBA1C in the last 72 hours. CBG:  Recent Labs Lab 11/30/16 1221 11/30/16 1711 11/30/16 2118 12/01/16 0529 12/01/16 1409  GLUCAP 125* 119* 122* 123* 126*   Lipid Profile: No results for input(s): CHOL, HDL, LDLCALC, TRIG, CHOLHDL, LDLDIRECT in the last 72 hours. Thyroid Function Tests: No results for input(s): TSH, T4TOTAL, FREET4, T3FREE, THYROIDAB in the last 72 hours. Anemia Panel: No results for input(s): VITAMINB12, FOLATE, FERRITIN, TIBC, IRON, RETICCTPCT in the last 72 hours. Sepsis Labs:  Recent Labs Lab 11/24/16 1522 11/25/16 0518 11/26/16 0444  PROCALCITON 0.56 0.65 3.51    Recent Results (from the past 240 hour(s))  Culture, blood (Routine  X 2) w Reflex to ID Panel     Status: None   Collection Time: 11/24/16  1:45 PM  Result Value Ref Range Status   Specimen Description BLOOD LEFT HAND  Final   Special Requests BOTTLES DRAWN AEROBIC AND ANAEROBIC 5CC EACH  Final   Culture   Final    NO GROWTH 5 DAYS Performed at [facility], [place] [address], [place], [place] [address]    Report Status 11/29/2016 FINAL  Final  Culture, blood (Routine X 2) w Reflex to ID Panel     Status: None   Collection Time: 11/24/16  1:50 PM  Result Value Ref Range Status   Specimen Description BLOOD LEFT HAND  Final   Special Requests IN PEDIATRIC BOTTLE .[place]  Final   Culture   Final    NO GROWTH 5 DAYS Performed at [facility], [place] [address], [place], [place] [address]    Report Status 11/29/2016 FINAL  Final  MRSA PCR Screening  Status: Abnormal   Collection Time: 11/28/16 12:40 PM  Result Value Ref Range Status   MRSA by PCR POSITIVE (A) NEGATIVE Final    Comment:        The GeneXpert MRSA Assay (FDA approved for NASAL specimens only), is one component of a comprehensive MRSA colonization surveillance program. It is not intended to diagnose MRSA infection nor to guide or monitor treatment for MRSA infections. RESULT CALLED TO, READ BACK BY AND VERIFIED WITH: F.AKAND RN AT [id number] ON 11/28/16 BY S.VANHOORNE          Radiology Studies: No results found.      Scheduled Meds: . bisacodyl  10 mg Rectal Daily  . Chlorhexidine Gluconate Cloth  6 each Topical Q0600  . cloNIDine  0.2 mg Transdermal Weekly  . cloNIDine  0.1 mg Per Tube Once  . enoxaparin (LOVENOX) injection  40 mg Subcutaneous Q24H  . feeding supplement (PRO-STAT SUGAR FREE 64)  30 mL Oral BID  . furosemide  20 mg Oral BID  . hydrALAZINE  20 mg Intravenous Q6H  . hydrochlorothiazide  25 mg Oral Daily  . HYDROmorphone   Intravenous Q4H  . insulin aspart  0-9 Units Subcutaneous Q8H  . lip balm  1 application Topical BID  . metoprolol  10 mg  Intravenous Q6H  . mupirocin ointment  1 application Nasal BID  . nitroGLYCERIN  1 inch Topical Q6H  . pantoprazole (PROTONIX) IV  40 mg Intravenous Q12H   Continuous Infusions: . Marland KitchenTPN (CLINIMIX-E) Adult 83 mL/hr at 11/30/16 1722  . Marland KitchenTPN (CLINIMIX-E) Adult     And  . fat emulsion       LOS: 34 days    Cordelia Poche [facility] 12/01/2016, 2:43 PM Pager: [phone]  If 7PM-7AM, please contact night-coverage [email or web address] Password [email or web address]

## 2016-12-01 NOTE — Progress Notes (Signed)
OT Cancellation Note  Patient Details Name: Melanie [name] MRN: [id number] DOB: [birth year]   Cancelled Treatment:    Reason Eval/Treat Not Completed: Other (comment). Attempted OT tx.  Pt states she is too fatiqued this pm.  NT states pt has been rolling a lot for bedpan. Encouraged her to have nursing assist her with OOB this weekend.  Will try to check back on Monday  Zeeshan Korte 12/01/2016, 2:20 PM  Lesle Chris, OTR/L [id number] 12/01/2016

## 2016-12-02 ENCOUNTER — Inpatient Hospital Stay (HOSPITAL_COMMUNITY): Payer: Medicare Other

## 2016-12-02 DIAGNOSIS — M7989 Other specified soft tissue disorders: Secondary | ICD-10-CM

## 2016-12-02 LAB — CBC
HCT: 24.1 % — ABNORMAL LOW (ref 36.0–46.0)
HEMOGLOBIN: 8 g/dL — AB (ref 12.0–15.0)
MCH: 26.8 pg (ref 26.0–34.0)
MCHC: 33.2 g/dL (ref 30.0–36.0)
MCV: 80.6 fL (ref 78.0–100.0)
PLATELETS: 393 10*3/uL (ref 150–400)
RBC: 2.99 MIL/uL — AB (ref 3.87–5.11)
RDW: 17.7 % — ABNORMAL HIGH (ref 11.5–15.5)
WBC: 13.1 10*3/uL — ABNORMAL HIGH (ref 4.0–10.5)

## 2016-12-02 LAB — GLUCOSE, CAPILLARY
Glucose-Capillary: 110 mg/dL — ABNORMAL HIGH (ref 65–99)
Glucose-Capillary: 122 mg/dL — ABNORMAL HIGH (ref 65–99)
Glucose-Capillary: 122 mg/dL — ABNORMAL HIGH (ref 65–99)

## 2016-12-02 MED ORDER — FAT EMULSION 20 % IV EMUL
240.0000 mL | INTRAVENOUS | Status: AC
  Administered 2016-12-02: 240 mL via INTRAVENOUS
  Filled 2016-12-02: qty 250

## 2016-12-02 MED ORDER — ENSURE ENLIVE PO LIQD
237.0000 mL | Freq: Two times a day (BID) | ORAL | Status: DC
  Administered 2016-12-03: 237 mL via ORAL

## 2016-12-02 MED ORDER — TRACE MINERALS CR-CU-MN-SE-ZN 10-1000-500-60 MCG/ML IV SOLN
83.0000 mL/h | INTRAVENOUS | Status: AC
  Administered 2016-12-02: 18:00:00 via INTRAVENOUS
  Filled 2016-12-02: qty 1992

## 2016-12-02 MED ORDER — MIRTAZAPINE 15 MG PO TBDP
7.5000 mg | ORAL_TABLET | Freq: Every day | ORAL | Status: DC
  Administered 2016-12-02 – 2016-12-08 (×7): 7.5 mg via ORAL
  Filled 2016-12-02 (×7): qty 0.5

## 2016-12-02 MED ORDER — HYDRALAZINE HCL 50 MG PO TABS
50.0000 mg | ORAL_TABLET | Freq: Four times a day (QID) | ORAL | Status: DC
  Administered 2016-12-02 – 2016-12-09 (×29): 50 mg via ORAL
  Filled 2016-12-02 (×29): qty 1

## 2016-12-02 NOTE — Progress Notes (Signed)
CONSULT NOTE    Melanie [name]  [id number] DOB: 02/05/1936 DOA: 10/28/2016 PCP: Osborne Casco, MD   Brief Narrative:  Melanie [name] is a 81 y.o. female with prolonged hospitalization, admitted on 01/20 for necrotic cholecystitis. Developed post op bowel obstruction, re-intervened on 02/09 with exploratory laparotomy. NG tube in place, on TPN. Consulted for chest pain that ruled out for ACS. Patient has been in persistent pain, on PCA pump. Patient had third surgical intervention, emergent laparatomy, recurrent bowel obstruction with volvulus with ischemic segment. Resumed clonidine for blood pressure control. Worsening hypertension, started on nicardipine drip.  Clonidine dose increase to 0.2 mg, patient now off nicardipine. Palliative care discussions.   Assessment & Plan:   Principal Problem:   Small bowel obstruction s/p ex lap & lysis of adhesions 11/17/2016 Active Problems:   Necrotic cholecystitis s/p lap cholecystectomy 10/30/2016   Essential hypertension   Hyperlipidemia   COPD (chronic obstructive pulmonary disease) (HCC)   GERD (gastroesophageal reflux disease)   Chest pain   H/O exploratory laparotomy   Anxiousness   Pressure injury of skin  Necrotic cholecystitis -management per primary team (surgery)  Post-operative bowel obstruction Volvulus Ischemic bowel S/p Zosyn. On TPN -per primary  Atypical Chest Pain Resolved. -continue nitro sublingual prn  V-tach Patient had asymptomatic episode of v-tach consisting of 22 beats. Normal QTc.  Hypertension Remains poorly controlled -increase to hydralazine 50mg  -continue metoprolol -continue clonidine patch -continue hydrochlorothiazide  COPD Wheezing improved. -Duoneb QID prn  Anemia Stable. - Transfuse for Hgb < 7  Dyspnea Possibly secondary to pleural effusion. No wheezing. -continue lasix PO -give one dose of lasix IV -strict in/out -weights -chest x-ray   DVT prophylaxis:  Lovenox Code Status: Full Family Communication: son at bedside Disposition Plan: Per surgical team   Procedures:   Laparoscopic cholecystectomy 10/30/16  Exploratory laparotomy with LOA 11/17/16  ECHO 2/12- EF 60-65%, G1DD.  NG (1/30-2/4, 2/9- )  Exploratory laparotomy (2/16)  Antimicrobials:  Zosyn (1/20-1/24, 1/27-2/9, 2/15-2/18)   Subjective: Continued dyspnea. No chest pain. Abdominal pain present. No nausea/vomiting.  Objective: Vitals:   12/02/16 0210 12/02/16 0413 12/02/16 0532 12/02/16 0800  BP: (!) 153/56  (!) 158/61   Pulse: 73  81   Resp: 15 14 15 12   Temp: 97.7 F (36.5 C)  98.7 F (37.1 C)   TempSrc: Oral  Oral   SpO2: 100% 100% 100% 100%  Weight:   92.9 kg (204 lb 12.9 oz)   Height:        Intake/Output Summary (Last 24 hours) at 12/02/16 1024 Last data filed at 12/02/16 0600  Gross per 24 hour  Intake             1102 ml  Output             1725 ml  Net             -623 ml   Filed Weights   11/28/16 1753 11/29/16 1436 12/02/16 0532  Weight: 93.3 kg (205 lb 11 oz) 96 kg (211 lb 10.2 oz) 92.9 kg (204 lb 12.9 oz)    Examination:  General exam: Appears to be in pain, in mild distress Respiratory system: diminished bilaterally with scattered wheezing. Respiratory effort normal. Cardiovascular system: S1 & S2 heard, RRR. No murmurs Gastrointestinal system: Diffuse mild tenderness without guarding. Slightly distended. Incision site without erythema or tenderness Central nervous system: Alert and oriented. No focal neurological deficits. Extremities: some mild edema of right hand. Some lower extremity edema.  No calf tenderness Skin: No cyanosis. No rashes Psychiatry: Judgement and insight appear normal. Mood & affect appropriate.     Data Reviewed: I have personally reviewed following labs and imaging studies  CBC:  Recent Labs Lab 11/27/16 0444 11/28/16 0344 11/29/16 0835 11/30/16 0413 12/02/16 0522  WBC 16.4* 18.9* 16.4* 16.5* 13.1*   NEUTROABS 13.2*  --   --   --   --   HGB 9.2* 9.1* 8.9* 8.6* 8.0*  HCT 27.4* 26.6* 26.9* 25.5* 24.1*  MCV 80.8 79.9 80.5 80.7 80.6  PLT 417* 409* 403* 397 [id number]   Basic Metabolic Panel:  Recent Labs Lab 11/26/16 0444 11/27/16 0444 11/28/16 0344 11/30/16 0413  NA 136 135 135 137  K 3.9 3.5 3.4* 3.5  CL 103 104 107 109  CO2 24 23 22 23   GLUCOSE 133* 142* 135* 126*  BUN 19 17 17 18   CREATININE 0.44 0.44 0.42* 0.40*  CALCIUM 8.1* 7.9* 8.0* 7.9*  MG 1.8 1.7 1.8 1.7  PHOS  --  3.3  --  3.8   GFR: Estimated Creatinine Clearance: 61.5 mL/min (by C-G formula based on SCr of 0.4 mg/dL (L)). Liver Function Tests:  Recent Labs Lab 11/27/16 0444 11/28/16 0344 11/30/16 0413  AST 46* 31 27  ALT 107* 80* 47  ALKPHOS 278* 255* 227*  BILITOT 1.3* 1.3* 0.6  PROT 6.1* 6.1* 5.8*  ALBUMIN 1.7* 1.7* 1.7*   No results for input(s): LIPASE, AMYLASE in the last 168 hours. No results for input(s): AMMONIA in the last 168 hours. Coagulation Profile: No results for input(s): INR, PROTIME in the last 168 hours. Cardiac Enzymes:  Recent Labs Lab 11/27/16 0032 11/27/16 0611 11/27/16 1209  TROPONINI 0.15* 0.12* 0.11*   BNP (last 3 results) No results for input(s): PROBNP in the last 8760 hours. HbA1C: No results for input(s): HGBA1C in the last 72 hours. CBG:  Recent Labs Lab 11/30/16 2118 12/01/16 0529 12/01/16 1409 12/01/16 2202 12/02/16 0544  GLUCAP 122* 123* 126* 117* 110*   Lipid Profile: No results for input(s): CHOL, HDL, LDLCALC, TRIG, CHOLHDL, LDLDIRECT in the last 72 hours. Thyroid Function Tests: No results for input(s): TSH, T4TOTAL, FREET4, T3FREE, THYROIDAB in the last 72 hours. Anemia Panel: No results for input(s): VITAMINB12, FOLATE, FERRITIN, TIBC, IRON, RETICCTPCT in the last 72 hours. Sepsis Labs:  Recent Labs Lab 11/26/16 0444  PROCALCITON 3.51    Recent Results (from the past 240 hour(s))  Culture, blood (Routine X 2) w Reflex to ID Panel      Status: None   Collection Time: 11/24/16  1:45 PM  Result Value Ref Range Status   Specimen Description BLOOD LEFT HAND  Final   Special Requests BOTTLES DRAWN AEROBIC AND ANAEROBIC 5CC EACH  Final   Culture   Final    NO GROWTH 5 DAYS Performed at [facility], [place] [address], [place], [place] [address]    Report Status 11/29/2016 FINAL  Final  Culture, blood (Routine X 2) w Reflex to ID Panel     Status: None   Collection Time: 11/24/16  1:50 PM  Result Value Ref Range Status   Specimen Description BLOOD LEFT HAND  Final   Special Requests IN PEDIATRIC BOTTLE .[place]  Final   Culture   Final    NO GROWTH 5 DAYS Performed at [facility], [place] [address], [place], [place] [address]    Report Status 11/29/2016 FINAL  Final  MRSA PCR Screening  Status: Abnormal   Collection Time: 11/28/16 12:40 PM  Result Value Ref Range Status   MRSA by PCR POSITIVE (A) NEGATIVE Final    Comment:        The GeneXpert MRSA Assay (FDA approved for NASAL specimens only), is one component of a comprehensive MRSA colonization surveillance program. It is not intended to diagnose MRSA infection nor to guide or monitor treatment for MRSA infections. RESULT CALLED TO, READ BACK BY AND VERIFIED WITH: F.AKAND RN AT [id number] ON 11/28/16 BY S.VANHOORNE          Radiology Studies: No results found.      Scheduled Meds: . bisacodyl  10 mg Rectal Daily  . Chlorhexidine Gluconate Cloth  6 each Topical Q0600  . cloNIDine  0.2 mg Transdermal Weekly  . cloNIDine  0.1 mg Per Tube Once  . enoxaparin (LOVENOX) injection  40 mg Subcutaneous Q24H  . feeding supplement (PRO-STAT SUGAR FREE 64)  30 mL Oral BID  . furosemide  20 mg Oral BID  . hydrALAZINE  50 mg Oral Q6H  . hydrochlorothiazide  25 mg Oral Daily  . HYDROmorphone   Intravenous Q4H  . insulin aspart  0-9 Units Subcutaneous Q8H  . lip balm  1 application Topical BID  . metoprolol tartrate  50 mg Oral BID  . mupirocin ointment   1 application Nasal BID  . nitroGLYCERIN  1 inch Topical Q6H  . pantoprazole (PROTONIX) IV  40 mg Intravenous Q12H   Continuous Infusions: . Marland KitchenTPN (CLINIMIX-E) Adult 70 mL/hr at 12/01/16 1812     LOS: 43 days    Cordelia Poche [facility] 12/02/2016, 10:24 AM Pager: [phone]  If 7PM-7AM, please contact night-coverage [email or web address] Password [email or web address]

## 2016-12-02 NOTE — Progress Notes (Signed)
Physical Therapy Treatment Patient Details Name: Melanie [name] MRN: [id number] DOB: [birth year] Today's Date: 12/02/2016    History of Present Illness 81 y.o. female with medical history significant of gallstones presents to the hospital with the chief complaint of abdominal pain. s/p laparoscopic cholecystectomy 10/30/16. Post op ileus, Exploratory laparotomy with LOA - findings:  Mechanical obstruction, 11/17/16, Dr. Dalbert Batman; Patient had third surgical intervention, emergent laparatomy d/t recurrent bowel obstruction with volvulus with ischemic segment, 2/16    PT Comments    Pt making good progress today, greatly incr activity--> able to amb into hallway and perform repeated transfers; also RN was  able to get order from [facility] for pt to go outside (she has been in hospital for 34days); pt outside via w/c with PT--pt very pleased and thankful to be out of her hospital room; will continue to follow--aware of pt conversations with Palliative Care Team. Pt very agreeable to PT today  Follow Up Recommendations  SNF     Equipment Recommendations  None recommended by PT    Recommendations for Other Services       Precautions / Restrictions Precautions Precautions: Fall Restrictions Weight Bearing Restrictions: No    Mobility  Bed Mobility Overal bed mobility: Needs Assistance Bed Mobility: Supine to Sit     Supine to sit: Min assist;Mod assist     General bed mobility comments: light assist with LEs off  bed, assist to bring trunk to upright and lower, bed pad used to assist scooting to EOB, overall incr pt effort  Transfers Overall transfer level: Needs assistance Equipment used: Rolling walker (2 wheeled) Transfers: Sit to/from [facility] Sit to Stand: Min assist;Mod assist Stand pivot transfers: Min assist       General transfer comment: assist to rise and stabilize, cues for hand placement, control of descent;  (sit to stand and SPT repeated  x3)  Ambulation/Gait Ambulation/Gait assistance: Min assist;+2 safety/equipment Ambulation Distance (Feet): 16 Feet (6' more in room--BSC-->chair) Assistive device: Rolling walker (2 wheeled) Gait Pattern/deviations: Step-through pattern;Trunk flexed Gait velocity: decr   General Gait Details: multi-modal cues for posture, RW safety, breathing; incr time needed; +2 for chair/safety/lines   Stairs            Wheelchair Mobility    Modified [place] (Stroke Patients Only)       Balance Overall balance assessment: Needs assistance Sitting-balance support: Feet supported;No upper extremity supported Sitting balance-Leahy Scale: Fair     Standing balance support: Single extremity supported;During functional activity Standing balance-Leahy Scale: Fair Standing balance comment: needs at least single UE support for balance during functional activity; able to maintain static without UE support                    Cognition Arousal/Alertness: Awake/alert Behavior During Therapy: WFL for tasks assessed/performed Overall Cognitive Status: Within Functional Limits for tasks assessed                 General Comments: processing improved, pt asks what day of week it is    Exercises General Exercises - Lower Extremity Ankle Circles/Pumps: AROM;Both;5 reps    General Comments        Pertinent Vitals/Pain Pain Assessment: 0-10 Pain Score: 8  (pain level 3 end of session) Pain Location: abd Pain Descriptors / Indicators: Sore Pain Intervention(s): Limited activity within patient's tolerance;Monitored during session;PCA encouraged;Repositioned;RN gave pain meds during session    Home Living  Prior Function            PT Goals (current goals can now be found in the care plan section) Acute Rehab PT Goals Patient Stated Goal: return to church activities PT Goal Formulation: With patient/family Time For Goal Achievement:  12/13/16 Potential to Achieve Goals: Good Progress towards PT goals: Progressing toward goals    Frequency    Min 3X/week      PT Plan Current plan remains appropriate    Co-evaluation             End of Session   Activity Tolerance: Patient tolerated treatment well Patient left: in chair;with call bell/phone within reach;with family/visitor present;with nursing/sitter in room Nurse Communication: Mobility status PT Visit Diagnosis: Muscle weakness (generalized) (M62.81)     Time: [id number] PT Time Calculation (min) (ACUTE ONLY): 54 min  Charges:  $Gait Training: 8-22 mins $Therapeutic Activity: 23-37 mins                    G Codes:       Melanie [name] 2016-12-24, 1:43 PM

## 2016-12-02 NOTE — Progress Notes (Signed)
[place] NOTE   Pharmacy Consult for TPN Indication: prolonged ileus  Patient Measurements: Height: 5\' 2"  (157.5 cm) Weight: 204 lb 12.9 oz (92.9 kg) IBW/kg (Calculated) : 50.1 TPN AdjBW (KG): 60.9 Body mass index is 37.46 kg/m.  Insulin Requirements: 1 Unit SSI/ 24 hr  Current Nutrition: Clear liquids, Pro-stat tid   IVF: none  Central access: PICC TPN start date: 11/08/16  ASSESSMENT                                                                   HPI:  81 y.o F with hx of gallstones presented to the ED on 1/20 with c/o abdominal pain. MRCP on 1/22 showed distended gallbladder with tiny gallstones and wall thickening with suspicion for acute cholecystitis. Patient underwent lap cholecystectomy with intraoperative cholangiogram on 1/22.  To start TPN for prolonged ileus and poor oral intake.  Significant events:  1/22: lap chole with cholangiogram 1/30: abd KUB with suspicion for post-op ileus; NG tube placed  2/2: high NG output from 1/31-2/1 (1367ml) but decreased 2/1-2/2 (300 ml). RN reported foul smell from NG tube --CCS may d/c NG 2/3: trial clamping NG - successful 2/4 NGT removed. Switching from E 5/20 to E 5/15 to better align with nutritional goals 2/6 leave NGT out, continue NPO, GI consult 2/8 Xray consistent with ileus or SBO - possible ex lap tomorrow 2/9: Underwent exploratory laparotomy, lysis of adhesions.  2/16: back to OR for recurrent SBO.  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 2/21: starting clear liquids; fluid-overloaded per CCS, starting lasix and stopping IVF. Nutrition requesting increasing goal rate to give full 2L daily (83 ml/hr) as patient with several surgeries since last assessment 2/23-4: taking Pro-stat bid, total 30gm protein/day  Today 12/02/16:   Glucose: no Hx DM, CBGs controlled  Electrolytes: K low normal after repletion,  others WNL, corrected Ca 9.3  Renal: SCr/BUN  ok; I/O likely not accurate at ~30L+ since admit, but does reflect almost 20 kg weight gain  LFTs:  AlkPhos and ALT elevated (TPN related?) but trending down; T bili now wnl, albumin low  TGs: wnl, 69 (2/12), 111 (2/19)  Prealbumin: 5.9 (2/5), 8.3 (2/12), decreased to 6.0 (2/19)  NUTRITIONAL GOALS                                                                                             RD recs (2/15) Kcal:  1750-1950 Protein:  80-90g Fluid:  1.8L/day  Clinimix E 5/15 (switched from E 5/20 at 70 ml/hr due to availability) at a goal rate of 75 ml/hr + 20% fat emulsion at 20 ml/hr x 12 hr to provide:  90 g/day protein, 1758 Kcal/day.  Clinimix E 5/15 at 83 ml/hr + 20 Lipids at 20 ml/hr x 12 hr will provide 100gm protein, ~1900 kCal per day  (+++There is currently a  national backorder of Clinimix solution. Pharmacy will use Clinimix product based on availability+++)  PLAN   Hold off on repleting K as no labs drawn following supplementation yesterday  At 1800 today:  Increase Clinimix E 5/15 back to to 83 ml/hr (provides ~1900 kcal, 100 g protein/day) (Pro-stat 30gm/day if patient consumes)  20% fat emulsion at 20 ml/hr over 12 hrs  Trace elements are on national shortage, will be added every other day (last added 2/24).  Multivitamins will be added daily.  Continue sensitive SSI q8h.   TPN lab panels on Mondays & Thursdays  Minda Ditto PharmD Pager [phone] 12/02/2016, 10:29 AM

## 2016-12-02 NOTE — Progress Notes (Signed)
Patient ID: Melanie [name], female   DOB: 01/02/1936, 81 y.o.   MRN: [id number]   8 Days Post-Op   Subjective: No new complaints. She has ongoing abdominal pain but states this is gradually better. Not drinking much liquids but no nausea or vomiting. Has not been getting out of bed much. Having conversation with palliative care.  Objective: Vital signs in last 24 hours: Temp:  [97.7 F (36.5 C)-98.7 F (37.1 C)] 98.7 F (37.1 C) (02/24 0532) Pulse Rate:  [73-84] 81 (02/24 0532) Resp:  [12-16] 15 (02/24 0532) BP: (152-158)/(55-64) 158/61 (02/24 0532) SpO2:  [99 %-100 %] 100 % (02/24 0532) Weight:  [92.9 kg (204 lb 12.9 oz)] 92.9 kg (204 lb 12.9 oz) (02/24 0532) Last BM Date: 11/30/16  Intake/Output from previous day: 02/23 0701 - 02/24 0700 In: 1222 [P.O.:220; I.V.:1002] Out: 2125 [Urine:1725; Stool:400] Intake/Output this shift: No intake/output data recorded.  General appearance: alert, cooperative, no distress and Normal affect Resp: No wheezing or increased work of breathing GI: Moderate diffuse tenderness. No distention. Incision/Wound: No erythema or drainage  Lab Results:   Recent Labs  11/30/16 0413 12/02/16 0522  WBC 16.5* 13.1*  HGB 8.6* 8.0*  HCT 25.5* 24.1*  PLT 397 393   BMET  Recent Labs  11/30/16 0413  NA 137  K 3.5  CL 109  CO2 23  GLUCOSE 126*  BUN 18  CREATININE 0.40*  CALCIUM 7.9*     Studies/Results: No results found.  Anti-infectives: Anti-infectives    Start     Dose/Rate Route Frequency Ordered Stop   11/23/16 1800  piperacillin-tazobactam (ZOSYN) IVPB 3.375 g  Status:  Discontinued     3.375 g 12.5 mL/hr over 240 Minutes Intravenous Every 8 hours 11/23/16 1753 11/26/16 0839   11/17/16 0730  cefoTEtan (CEFOTAN) 2 g in dextrose 5 % 50 mL IVPB     2 g 100 mL/hr over 30 Minutes Intravenous On call to O.R. 11/17/16 0717 11/17/16 1040   11/04/16 0900  piperacillin-tazobactam (ZOSYN) IVPB 3.375 g  Status:  Discontinued     3.375  g 12.5 mL/hr over 240 Minutes Intravenous Every 8 hours 11/04/16 0833 11/17/16 0631   10/30/16 1600  piperacillin-tazobactam (ZOSYN) IVPB 3.375 g  Status:  Discontinued     3.375 g 12.5 mL/hr over 240 Minutes Intravenous Every 8 hours 10/30/16 1539 11/01/16 1814   10/29/16 1200  piperacillin-tazobactam (ZOSYN) IVPB 3.375 g  Status:  Discontinued     3.375 g 12.5 mL/hr over 240 Minutes Intravenous Every 8 hours 10/29/16 0941 10/30/16 1539      Assessment/Plan:  Admitted 1/20 - Medicine S/p Laparoscopic Cholecystectomy with intraoperative cholangiogram, 10/30/16, Dr. Rodman Key Martin;Necrotic gallbladder with free intrapertioneal bile; enlarged CBD with free flow into the duodenum POD 32 Exploratory laparotomy with LOA - findings: Mechanical obstruction, 11/17/16, Dr. Dalbert Batman POD 14 Exploratory laparotomy, LOA, reduction of SB volvulus 2/16- Dr. Zella Richer, POD 7  Swelling right arm - Doppler is negative for DVT   Chest pain with elevated troponin -improved.  Echo EF 0000000 1 diastolic dysfunction/Trivial MR- continue prn nitro Anemia Stable post transfusion. Hgb - 8.6 - 11/30/2016 Severe protein calorie malnutrition -prealbumin 6.0 - 11/27/2016 on TNA.   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  No abscess at time of surgery. Currently no antibitotics.  WBC improved DVT prophylaxis: Lovenox  Plan: Palliative care is following to establish goals of care.              Continue to encourage ambulation/PT/OT             Continue full liquids and TPN. Needs to have more PO intake before weaning TPN                        Looking into possible SNF at d/c at this point   LOS: 35 days     LOS: 35 days    Diquan Kassis  T 12/02/2016

## 2016-12-02 NOTE — Progress Notes (Signed)
Daily Progress Note   Patient Name: Melanie [name]       Date: 12/02/2016 DOB: 1936-05-15  Age: 81 y.o. MRN#: [id number] Attending Physician: Nolon Nations, MD Primary Care Physician: Osborne Casco, MD Admit Date: 10/28/2016  Reason for Consultation/Follow-up: Establishing goals of care  Subjective:  awake alert Has pain in abdomen, using PCA  Son visiting from [place], [place] See below:   Length of Stay: 35  Current Medications: Scheduled Meds:  . bisacodyl  10 mg Rectal Daily  . Chlorhexidine Gluconate Cloth  6 each Topical Q0600  . cloNIDine  0.2 mg Transdermal Weekly  . cloNIDine  0.1 mg Per Tube Once  . enoxaparin (LOVENOX) injection  40 mg Subcutaneous Q24H  . feeding supplement (PRO-STAT SUGAR FREE 64)  30 mL Oral BID  . furosemide  20 mg Oral BID  . hydrALAZINE  50 mg Oral Q6H  . hydrochlorothiazide  25 mg Oral Daily  . HYDROmorphone   Intravenous Q4H  . insulin aspart  0-9 Units Subcutaneous Q8H  . lip balm  1 application Topical BID  . metoprolol tartrate  50 mg Oral BID  . mirtazapine  7.5 mg Oral QHS  . mupirocin ointment  1 application Nasal BID  . nitroGLYCERIN  1 inch Topical Q6H  . pantoprazole (PROTONIX) IV  40 mg Intravenous Q12H    Continuous Infusions: . Marland KitchenTPN (CLINIMIX-E) Adult 70 mL/hr at 12/01/16 1812  . Marland KitchenTPN (CLINIMIX-E) Adult     And  . fat emulsion      PRN Meds: alum & mag hydroxide-simeth, chlorproMAZINE (THORAZINE) IV, diphenhydrAMINE **OR** diphenhydrAMINE, diphenhydrAMINE, HYDROmorphone (DILAUDID) injection, ipratropium-albuterol, LORazepam, magic mouthwash, menthol-cetylpyridinium, naloxone **AND** sodium chloride flush, ondansetron (ZOFRAN) IV, phenol, polyvinyl alcohol, prochlorperazine, sodium chloride flush  Physical Exam           NAD In mild distress due to abdominal pain S1 S2 Clear Abdomen mild generalized tenderness, midline incision healing well Has edema in both UE and LE Awake alert non focal  Vital Signs: BP (!) 157/68 (BP Location: Left Arm)   Pulse 73   Temp 97.9 F (36.6 C) (Oral)   Resp 16   Ht 5\' 2"  (1.575 m)   Wt 92.9 kg (204 lb 12.9 oz)   SpO2 100%   BMI 37.46 kg/m  SpO2: SpO2: 100 % O2 Device:  O2 Device: Nasal Cannula O2 Flow Rate: O2 Flow Rate (L/min): 2 L/min  Intake/output summary:   Intake/Output Summary (Last 24 hours) at 12/02/16 1428 Last data filed at 12/02/16 1000  Gross per 24 hour  Intake             1422 ml  Output             1725 ml  Net             -303 ml   LBM: Last BM Date: 12/01/16 Baseline Weight: Weight: 70.3 kg (155 lb) Most recent weight: Weight: 92.9 kg (204 lb 12.9 oz)       Palliative Assessment/Data:    Flowsheet Rows   Flowsheet Row Most Recent Value  Intake Tab  Referral Department  Surgery  Unit at Time of Referral  Med/Surg Unit  Palliative Care Primary Diagnosis  Other (Comment)  Date Notified  12/01/16  Palliative Care Type  New Palliative care  Reason for referral  Clarify Goals of Care  Date of Admission  10/28/16  Date first seen by Palliative Care  12/01/16  # of days Palliative referral response time  0 Day(s)  # of days IP prior to Palliative referral  34  Clinical Assessment  Palliative Performance Scale Score  20%  Pain Max last 24 hours  5  Pain Min Last 24 hours  4  Dyspnea Max Last 24 Hours  4  Dyspnea Min Last 24 hours  3  Psychosocial & Spiritual Assessment  Palliative Care Outcomes  Patient/Family meeting held?  Yes  Who was at the meeting?  patient daughter       Patient Active Problem List   Diagnosis Date Noted  . Pressure injury of skin 11/28/2016  . Anxiousness   . H/O exploratory laparotomy 11/24/2016  . Chest pain   . Small bowel obstruction s/p ex lap & lysis of adhesions 11/17/2016 11/17/2016  .  Necrotic cholecystitis s/p lap cholecystectomy 10/30/2016 11/09/2016  . GERD (gastroesophageal reflux disease) 11/09/2016  . Essential hypertension   . Hyperlipidemia   . COPD (chronic obstructive pulmonary disease) [facility])     Palliative Care Assessment & Plan   Patient Profile:    Assessment:  Patient is a 81 year old lady admitted to the hospital since 1-20 for necrotic cholecystitis. Patient developed postop bowel obstruction, is a status post exploratory laparotomy the intervention. Patient initially also required NG tube, is still on TPN. Hospitalist medicine services have been following the patient as well. Patient was seen for chest discomfort was ruled out for acute coronary syndrome. Patient has remained on Dilaudid bolus only PCA. Patient also required exploratory laparotomy for recurrent bowel obstruction with volvulus. Hospital course also complicated by uncontrolled blood pressures requiring nicardipine drip. Patient remains with suboptimal oral intake in spite of diet being advanced to full liquids. She continues to have loose bowel movements. She continues to require PCA for abdominal discomfort.   Recommendations/Plan:   start Remeron  Discussed in detail with patient and son about DNR DNI. Patient and family to have discussions amongst themselves.   Disposition: SNF rehab discussed in detail. Patient wished to have additional information given again about hospice, as per her request, discussed about hospice with patient and son. All questions answered to the best of my ability.    Code Status:    Code Status Orders        Start     Ordered   11/18/16 0758  Full code  Continuous  11/18/16 0758    Code Status History    Date Active Date Inactive Code Status Order ID Comments User Context   10/28/2016  4:28 PM 11/18/2016  7:58 AM Full Code [id number]  Mauricio Gerome Apley, MD Inpatient    Advance Directive Documentation   Flowsheet Row Most Recent Value  Type  of Advance Directive  Healthcare Power of Attorney  Pre-existing out of facility DNR order (yellow form or pink MOST form)  No data  "MOST" Form in Place?  No data       Prognosis:   guarded  Discharge Planning:  To Be Determined  Care plan was discussed with  Patient, son.   Thank you for allowing the Palliative Medicine Team to assist in the care of this patient.   Time In: 9 Time Out: 9.35 Total Time 35 Prolonged Time Billed  no       Greater than 50%  of this time was spent counseling and coordinating care related to the above assessment and plan.  Loistine Chance, MD [phone]  Please contact Palliative Medicine Team phone at [phone] for questions and concerns.

## 2016-12-02 NOTE — Progress Notes (Signed)
*  PRELIMINARY RESULTS* Vascular Ultrasound Right upper extremity venous duplex has been completed.  Preliminary findings: the visualized veins of the right upper extremity appear negative for deep and superficial vein thrombosis.  Some segments of vessels obscured by PICC bandage.  Preliminary results given to nurse tech at 8:55.  Everrett Coombe 12/02/2016, 8:56 AM

## 2016-12-03 LAB — BASIC METABOLIC PANEL
Anion gap: 8 (ref 5–15)
BUN: 17 mg/dL (ref 6–20)
CHLORIDE: 100 mmol/L — AB (ref 101–111)
CO2: 28 mmol/L (ref 22–32)
Calcium: 8.2 mg/dL — ABNORMAL LOW (ref 8.9–10.3)
Creatinine, Ser: 0.47 mg/dL (ref 0.44–1.00)
GFR calc non Af Amer: >60 mL/min (ref >60)
Glucose, Bld: 121 mg/dL — ABNORMAL HIGH (ref 65–99)
Potassium: 2.4 mmol/L — CL (ref 3.5–5.1)
SODIUM: 136 mmol/L (ref 135–145)

## 2016-12-03 LAB — GLUCOSE, CAPILLARY
GLUCOSE-CAPILLARY: 113 mg/dL — AB (ref 65–99)
GLUCOSE-CAPILLARY: 129 mg/dL — AB (ref 65–99)
Glucose-Capillary: 136 mg/dL — ABNORMAL HIGH (ref 65–99)

## 2016-12-03 MED ORDER — M.V.I. ADULT IV INJ
83.0000 mL/h | INTRAVENOUS | Status: AC
  Administered 2016-12-03: 18:00:00 via INTRAVENOUS
  Filled 2016-12-03: qty 1992

## 2016-12-03 MED ORDER — FAT EMULSION 20 % IV EMUL
240.0000 mL | INTRAVENOUS | Status: AC
  Administered 2016-12-03: 240 mL via INTRAVENOUS
  Filled 2016-12-03: qty 250

## 2016-12-03 MED ORDER — POTASSIUM CHLORIDE 10 MEQ/100ML IV SOLN
10.0000 meq | INTRAVENOUS | Status: AC
  Administered 2016-12-03 (×6): 10 meq via INTRAVENOUS
  Filled 2016-12-03 (×6): qty 100

## 2016-12-03 NOTE — Clinical Social Work Note (Signed)
Patient has been seen by Palliative Care; family is deciding on status of DNR/DNI and are unsure of SNF vs Hospice at this time.  Pt currently receiving TPN. Fl2 and PASARR initiated in case patient/family decides to see SNF placement. Fl2 will require updating prior to d/c.  Lorie Phenix. Pauline Good, [place] (weekend coverage)

## 2016-12-03 NOTE — Progress Notes (Signed)
CONSULT NOTE    Melanie [name]  [id number] DOB: [birth year] DOA: 10/28/2016 PCP: Osborne Casco, MD   Brief Narrative:  Melanie [name] is a 81 y.o. female with prolonged hospitalization, admitted on 01/20 for necrotic cholecystitis. Developed post op bowel obstruction, re-intervened on 02/09 with exploratory laparotomy. NG tube in place, on TPN. Consulted for chest pain that ruled out for ACS. Patient has been in persistent pain, on PCA pump. Patient had third surgical intervention, emergent laparatomy, recurrent bowel obstruction with volvulus with ischemic segment. Resumed clonidine for blood pressure control. Worsening hypertension, started on nicardipine drip.  Clonidine dose increase to 0.2 mg, patient now off nicardipine. Palliative care discussions.   Assessment & Plan:   Principal Problem:   Small bowel obstruction s/p ex lap & lysis of adhesions 11/17/2016 Active Problems:   Necrotic cholecystitis s/p lap cholecystectomy 10/30/2016   Essential hypertension   Hyperlipidemia   COPD (chronic obstructive pulmonary disease) (HCC)   GERD (gastroesophageal reflux disease)   Chest pain   H/O exploratory laparotomy   Anxiousness   Pressure injury of skin  Necrotic cholecystitis -management per primary team (surgery)  Post-operative bowel obstruction Volvulus Ischemic bowel S/p Zosyn. On TPN -per primary  Atypical Chest Pain Resolved. -continue nitro sublingual prn  V-tach Patient had asymptomatic episode of v-tach consisting of 22 beats. Normal QTc. Resolved.  Hypertension Remains poorly controlled -continue hydralazine 50mg  -continue metoprolol -continue clonidine patch -continue hydrochlorothiazide  COPD Wheezing improved. -Duoneb QID prn  Anemia Stable. - Transfuse for Hgb < 7  Dyspnea Possibly secondary to pleural effusion. No wheezing. Unchanged. CXR shows improving pleural effusion. -continue lasix PO -strict in/out -weights   DVT  prophylaxis: Lovenox Code Status: Full Family Communication: son at bedside Disposition Plan: Per surgical team   Procedures:   Laparoscopic cholecystectomy 10/30/16  Exploratory laparotomy with LOA 11/17/16  ECHO 2/12- EF 60-65%, G1DD.  NG (1/30-2/4, 2/9- )  Exploratory laparotomy (2/16)  Antimicrobials:  Zosyn (1/20-1/24, 1/27-2/9, 2/15-2/18)   Subjective: Continued dyspnea at rest without chest pain. No wheezing or coughing. Abdominal pain still present. No bowel movement.  Objective: Vitals:   12/03/16 0922 12/03/16 1148 12/03/16 1307 12/03/16 1432  BP:   (!) 159/50 (!) 159/56  Pulse:   75 78  Resp: 13 10  15   Temp:    98.4 F (36.9 C)  TempSrc:    Oral  SpO2: 99% 100%  100%  Weight:      Height:        Intake/Output Summary (Last 24 hours) at 12/03/16 1509 Last data filed at 12/03/16 1432  Gross per 24 hour  Intake          1547.61 ml  Output             4400 ml  Net         -2852.39 ml   Filed Weights   11/29/16 1436 12/02/16 0532 12/03/16 0500  Weight: 96 kg (211 lb 10.2 oz) 92.9 kg (204 lb 12.9 oz) 90.7 kg (200 lb)    Examination:  General exam: well appearing, no distress Respiratory system: diminished bilaterally with scattered wheezing. Respiratory effort normal. Cardiovascular system: S1 & S2 heard, RRR. No murmurs Gastrointestinal system: Minimal diffuse tenderness without guarding. Slightly distended. Incision site without erythema or tenderness Central nervous system: Alert and oriented. No focal neurological deficits. Extremities: some mild edema of right hand. Some lower extremity edema. No calf tenderness Skin: No cyanosis. No rashes Psychiatry: Judgement and insight appear normal. Mood &  affect appropriate.     Data Reviewed: I have personally reviewed following labs and imaging studies  CBC:  Recent Labs Lab 11/27/16 0444 11/28/16 0344 11/29/16 0835 11/30/16 0413 12/02/16 0522  WBC 16.4* 18.9* 16.4* 16.5* 13.1*  NEUTROABS  13.2*  --   --   --   --   HGB 9.2* 9.1* 8.9* 8.6* 8.0*  HCT 27.4* 26.6* 26.9* 25.5* 24.1*  MCV 80.8 79.9 80.5 80.7 80.6  PLT 417* 409* 403* 397 [id number]   Basic Metabolic Panel:  Recent Labs Lab 11/27/16 0444 11/28/16 0344 11/30/16 0413 12/03/16 1325  NA 135 135 137 136  K 3.5 3.4* 3.5 2.4*  CL 104 107 109 100*  CO2 23 22 23 28   GLUCOSE 142* 135* 126* 121*  BUN 17 17 18 17   CREATININE 0.44 0.42* 0.40* 0.47  CALCIUM 7.9* 8.0* 7.9* 8.2*  MG 1.7 1.8 1.7  --   PHOS 3.3  --  3.8  --    GFR: Estimated Creatinine Clearance: 60.7 mL/min (by C-G formula based on SCr of 0.47 mg/dL). Liver Function Tests:  Recent Labs Lab 11/27/16 0444 11/28/16 0344 11/30/16 0413  AST 46* 31 27  ALT 107* 80* 47  ALKPHOS 278* 255* 227*  BILITOT 1.3* 1.3* 0.6  PROT 6.1* 6.1* 5.8*  ALBUMIN 1.7* 1.7* 1.7*   No results for input(s): LIPASE, AMYLASE in the last 168 hours. No results for input(s): AMMONIA in the last 168 hours. Coagulation Profile: No results for input(s): INR, PROTIME in the last 168 hours. Cardiac Enzymes:  Recent Labs Lab 11/27/16 0032 11/27/16 0611 11/27/16 1209  TROPONINI 0.15* 0.12* 0.11*   BNP (last 3 results) No results for input(s): PROBNP in the last 8760 hours. HbA1C: No results for input(s): HGBA1C in the last 72 hours. CBG:  Recent Labs Lab 12/02/16 0544 12/02/16 1356 12/02/16 2108 12/03/16 0528 12/03/16 1454  GLUCAP 110* 122* 122* 136* 113*   Lipid Profile: No results for input(s): CHOL, HDL, LDLCALC, TRIG, CHOLHDL, LDLDIRECT in the last 72 hours. Thyroid Function Tests: No results for input(s): TSH, T4TOTAL, FREET4, T3FREE, THYROIDAB in the last 72 hours. Anemia Panel: No results for input(s): VITAMINB12, FOLATE, FERRITIN, TIBC, IRON, RETICCTPCT in the last 72 hours. Sepsis Labs: No results for input(s): PROCALCITON, LATICACIDVEN in the last 168 hours.  Recent Results (from the past 240 hour(s))  Culture, blood (Routine X 2) w Reflex to ID Panel      Status: None   Collection Time: 11/24/16  1:45 PM  Result Value Ref Range Status   Specimen Description BLOOD LEFT HAND  Final   Special Requests BOTTLES DRAWN AEROBIC AND ANAEROBIC 5CC EACH  Final   Culture   Final    NO GROWTH 5 DAYS Performed at [facility], [place] [address] [place], [place] [address]    Report Status 11/29/2016 FINAL  Final  Culture, blood (Routine X 2) w Reflex to ID Panel     Status: None   Collection Time: 11/24/16  1:50 PM  Result Value Ref Range Status   Specimen Description BLOOD LEFT HAND  Final   Special Requests IN PEDIATRIC BOTTLE .[place]  Final   Culture   Final    NO GROWTH 5 DAYS Performed at [facility], [place] [address] [place], [place] [address]    Report Status 11/29/2016 FINAL  Final  MRSA PCR Screening     Status: Abnormal   Collection Time: 11/28/16 12:40 PM  Result Value Ref Range Status  MRSA by PCR POSITIVE (A) NEGATIVE Final    Comment:        The GeneXpert MRSA Assay (FDA approved for NASAL specimens only), is one component of a comprehensive MRSA colonization surveillance program. It is not intended to diagnose MRSA infection nor to guide or monitor treatment for MRSA infections. RESULT CALLED TO, READ BACK BY AND VERIFIED WITH: F.AKAND RN AT [id number] ON 11/28/16 BY S.VANHOORNE          Radiology Studies: Dg Chest Port 1 View  Result Date: 12/02/2016 CLINICAL DATA:  Shortness of breath. EXAM: PORTABLE CHEST 1 VIEW COMPARISON:  11/29/2016 cavoatrial junction. FINDINGS: Right arm PICC line tip is in the projection of the cavoatrial junction. Mild cardiac enlargement. Pleural effusions are less apparent on today's study. No interstitial edema. IMPRESSION: 1. Decrease in appearance of bilateral pleural effusions. 2. No new findings. Electronically Signed   By: Kerby Moors M.D.   On: 12/02/2016 11:21        Scheduled Meds: . bisacodyl  10 mg Rectal Daily  . Chlorhexidine Gluconate Cloth  6 each Topical Q0600   . cloNIDine  0.2 mg Transdermal Weekly  . cloNIDine  0.1 mg Per Tube Once  . enoxaparin (LOVENOX) injection  40 mg Subcutaneous Q24H  . feeding supplement (ENSURE ENLIVE)  237 mL Oral BID BM  . feeding supplement (PRO-STAT SUGAR FREE 64)  30 mL Oral BID  . furosemide  20 mg Oral BID  . hydrALAZINE  50 mg Oral Q6H  . hydrochlorothiazide  25 mg Oral Daily  . HYDROmorphone   Intravenous Q4H  . insulin aspart  0-9 Units Subcutaneous Q8H  . lip balm  1 application Topical BID  . metoprolol tartrate  50 mg Oral BID  . mirtazapine  7.5 mg Oral QHS  . mupirocin ointment  1 application Nasal BID  . nitroGLYCERIN  1 inch Topical Q6H  . pantoprazole (PROTONIX) IV  40 mg Intravenous Q12H   Continuous Infusions: . Marland KitchenTPN (CLINIMIX-E) Adult 83 mL/hr (12/02/16 1828)  . Marland KitchenTPN (CLINIMIX-E) Adult     And  . fat emulsion       LOS: 36 days    Cordelia Poche [facility] 12/03/2016, 3:09 PM Pager: [phone]  If 7PM-7AM, please contact night-coverage [email or web address] Password [email or web address]

## 2016-12-03 NOTE — Progress Notes (Addendum)
[place] NOTE   Pharmacy Consult for TPN Indication: prolonged ileus  Patient Measurements: Height: 5\' 2"  (157.5 cm) Weight: 200 lb (90.7 kg) IBW/kg (Calculated) : 50.1 TPN AdjBW (KG): 60.9 Body mass index is 36.58 kg/m.  Insulin Requirements: 3 Unit SSI/ 24 hr  Current Nutrition: Clear liquids, Pro-stat tid   IVF: none  Central access: PICC TPN start date: 11/08/16  ASSESSMENT                                                                   HPI:  81 y.o F with hx of gallstones presented to the ED on 1/20 with c/o abdominal pain. MRCP on 1/22 showed distended gallbladder with tiny gallstones and wall thickening with suspicion for acute cholecystitis. Patient underwent lap cholecystectomy with intraoperative cholangiogram on 1/22.  To start TPN for prolonged ileus and poor oral intake.  Significant events:  1/22: lap chole with cholangiogram 1/30: abd KUB with suspicion for post-op ileus; NG tube placed  2/2: high NG output from 1/31-2/1 (13106ml) but decreased 2/1-2/2 (300 ml). RN reported foul smell from NG tube --CCS may d/c NG 2/3: trial clamping NG - successful 2/4 NGT removed. Switching from E 5/20 to E 5/15 to better align with nutritional goals 2/6 leave NGT out, continue NPO, GI consult 2/8 Xray consistent with ileus or SBO - possible ex lap tomorrow 2/9: Underwent exploratory laparotomy, lysis of adhesions.  2/16: back to OR for recurrent SBO.  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 2/21: starting clear liquids; fluid-overloaded per CCS, starting lasix and stopping IVF. Nutrition requesting increasing goal rate to give full 2L daily (83 ml/hr) as patient with several surgeries since last assessment 2/23-4: taking Pro-stat bid, total 30gm protein/day. Added Ensure 2/24 - refused all  Today 12/03/16:   Glucose: no Hx DM, CBGs controlled  Electrolytes: K low today, replete IV  Renal: SCr/BUN ok; I/O  likely not accurate at ~30L+ since admit, but does reflect almost 20 kg weight gain  LFTs:  AlkPhos and ALT elevated (TPN related?) but trending down; T bili now wnl, albumin low  TGs: wnl, 69 (2/12), 111 (2/19)  Prealbumin: 5.9 (2/5), 8.3 (2/12), decreased to 6.0 (2/19)  NUTRITIONAL GOALS                                                                                             RD recs (2/15) Kcal:  1750-1950 Protein:  80-90g Fluid:  1.8L/day  Clinimix E 5/15 (switched from E 5/20 at 70 ml/hr due to availability) at a goal rate of 75 ml/hr + 20% fat emulsion at 20 ml/hr x 12 hr to provide:  90 g/day protein, 1758 Kcal/day.  Clinimix E 5/15 at 83 ml/hr + 20 Lipids at 20 ml/hr x 12 hr will provide 100gm protein, ~1900 kCal per day  (+++There is currently a Psychologist, prison and probation services  of Clinimix solution. Pharmacy will use Clinimix product based on availability+++)  PLAN   At 1800 today:  Continue Clinimix E 5/15 at 83 ml/hr (provides ~1900 kcal, 100 g protein/day) (Pro-stat 30gm/day if patient consumes)  20% fat emulsion at 20 ml/hr over 12 hrs  Trace elements are on national shortage, will be added every other day (last added 2/24).  Multivitamins will be added daily.  Continue sensitive SSI q8h.   TPN lab panels on Mondays & Thursdays  KCL 10 mEq IVPB x6  Follow up labs  Minda Ditto PharmD Pager [phone] 12/03/2016, 11:13 AM

## 2016-12-03 NOTE — NC FL2 (Signed)
[place] LEVEL OF CARE SCREENING TOOL     IDENTIFICATION  Patient Name: Melanie [name] Birthdate: 06-05-1936 Sex: female Admission Date (Current Location): 10/28/2016  [facility] and [place] Number:  Herbalist and Address:  [facility],  [place] [address] [place]      Provider Number: [id number]  Attending Physician Name and Address:  Md Edison Pace, MD  Relative Name and Phone Number:       Current Level of Care: Hospital Recommended Level of Care: [place] Prior Approval Number:    Date Approved/Denied:   PASRR Number: [id number]  Discharge Plan: SNF    Current Diagnoses: Patient Active Problem List   Diagnosis Date Noted  . Pressure injury of skin 11/28/2016  . Anxiousness   . H/O exploratory laparotomy 11/24/2016  . Chest pain   . Small bowel obstruction s/p ex lap & lysis of adhesions 11/17/2016 11/17/2016  . Necrotic cholecystitis s/p lap cholecystectomy 10/30/2016 11/09/2016  . GERD (gastroesophageal reflux disease) 11/09/2016  . Essential hypertension   . Hyperlipidemia   . COPD (chronic obstructive pulmonary disease) (HCC)     Orientation RESPIRATION BLADDER Height & Weight     Self, Time, Situation, Place  Normal Incontinent Weight: 200 lb (90.7 kg) Height:  5\' 2"  (157.5 cm)  BEHAVIORAL SYMPTOMS/MOOD NEUROLOGICAL BOWEL NUTRITION STATUS      Continent Diet, TNA (Full liquide  TNA currently)  AMBULATORY STATUS COMMUNICATION OF NEEDS Skin   Extensive Assist Verbally Surgical wounds (Lap incisions- sp gallbladder removal, Stage 2 anus- barrier cream to left and right side)                       Personal Care Assistance Level of Assistance  Bathing, Dressing Bathing Assistance: Limited assistance   Dressing Assistance: Limited assistance     Functional Limitations Info             SPECIAL CARE FACTORS FREQUENCY  PT (By licensed PT)                    Contractures Contractures  Info: Not present    Additional Factors Info  Allergies, Code Status Code Status Info: TBD Allergies Info: NSAIDS, Ace Inhibitors           Current Medications (12/03/2016):  This is the current hospital active medication list Current Facility-Administered Medications  Medication Dose Route Frequency Provider Last Rate Last Dose  . Marland KitchenTPN (CLINIMIX-E) Adult  83 mL/hr Intravenous Continuous TPN Minda Ditto, RPH 83 mL/hr at 12/03/16 1810     And  . fat emulsion 20 % infusion 240 mL  240 mL Intravenous Continuous TPN Minda Ditto, RPH 20 mL/hr at 12/03/16 1811 240 mL at 12/03/16 1811  . alum & mag hydroxide-simeth (MAALOX/MYLANTA) 200-200-20 MG/5ML suspension 30 mL  30 mL Oral Q6H PRN Michael Boston, MD      . bisacodyl (DULCOLAX) suppository 10 mg  10 mg Rectal Daily Michael Boston, MD   10 mg at 12/03/16 0930  . Chlorhexidine Gluconate Cloth 2 % PADS 6 each  6 each Topical Q0600 Ronald Lobo, MD   6 each at 12/03/16 0600  . chlorproMAZINE (THORAZINE) 25 mg in sodium chloride 0.9 % 25 mL IVPB  25 mg Intravenous Q6H PRN Michael Boston, MD      . cloNIDine (CATAPRES - Dosed in mg/24 hr) patch 0.2 mg  0.2 mg Transdermal Weekly Mauricio Gerome Apley, MD   0.2  mg at 11/27/16 1158  . cloNIDine (CATAPRES) tablet 0.1 mg  0.1 mg Per Tube Once Jani Gravel, MD      . diphenhydrAMINE (BENADRYL) injection 12.5 mg  12.5 mg Intravenous Q6H PRN Earnstine Regal, PA-C       Or  . diphenhydrAMINE (BENADRYL) 12.5 MG/5ML elixir 12.5 mg  12.5 mg Oral Q6H PRN Earnstine Regal, PA-C      . diphenhydrAMINE (BENADRYL) injection 12.5-25 mg  12.5-25 mg Intravenous Q6H PRN Michael Boston, MD      . enoxaparin (LOVENOX) injection 40 mg  40 mg Subcutaneous Q24H Fanny Skates, MD   40 mg at 12/03/16 [id number]  . feeding supplement (ENSURE ENLIVE) (ENSURE ENLIVE) liquid 237 mL  237 mL Oral BID BM Excell Seltzer, MD   237 mL at 12/03/16 1642  . feeding supplement (PRO-STAT SUGAR FREE 64) liquid 30 mL  30 mL Oral BID Mariel Aloe, MD   30 mL at 12/03/16 0926  . furosemide (LASIX) tablet 20 mg  20 mg Oral BID Mariel Aloe, MD   20 mg at 12/03/16 1842  . hydrALAZINE (APRESOLINE) tablet 50 mg  50 mg Oral Q6H Mariel Aloe, MD   50 mg at 12/03/16 1842  . hydrochlorothiazide (HYDRODIURIL) tablet 25 mg  25 mg Oral Daily Mariel Aloe, MD   25 mg at 12/03/16 [id number]  . HYDROmorphone (DILAUDID) 1 mg/mL PCA injection   Intravenous Q4H Earnstine Regal, PA-C      . HYDROmorphone (DILAUDID) injection 1-2 mg  1-2 mg Intravenous Q2H PRN Michael Boston, MD   1 mg at 12/02/16 1147  . insulin aspart (novoLOG) injection 0-9 Units  0-9 Units Subcutaneous Q8H Autumn Messing III, MD   1 Units at 12/03/16 0534  . ipratropium-albuterol (DUONEB) 0.5-2.5 (3) MG/3ML nebulizer solution 3 mL  3 mL Nebulization QID PRN Mariel Aloe, MD      . lip balm (CARMEX) ointment 1 application  1 application Topical BID Michael Boston, MD   1 application at [phone]  . LORazepam (ATIVAN) injection 0.5-1 mg  0.5-1 mg Intravenous Q8H PRN Michael Boston, MD      . magic mouthwash  15 mL Oral QID PRN Michael Boston, MD      . menthol-cetylpyridinium (CEPACOL) lozenge 3 mg  1 lozenge Oral PRN Modena Jansky, MD      . metoprolol (LOPRESSOR) tablet 50 mg  50 mg Oral BID Alphonsa Overall, MD   50 mg at 12/03/16 [id number]  . mirtazapine (REMERON SOL-TAB) disintegrating tablet 7.5 mg  7.5 mg Oral QHS Loistine Chance, MD   7.5 mg at 12/02/16 2032  . mupirocin ointment (BACTROBAN) 2 % 1 application  1 application Nasal BID Ronald Lobo, MD   1 application at [id number]  . naloxone [facility]) injection 0.4 mg  0.4 mg Intravenous PRN Earnstine Regal, PA-C       And  . sodium chloride flush (NS) 0.9 % injection 9 mL  9 mL Intravenous PRN Earnstine Regal, PA-C      . nitroGLYCERIN (NITROGLYN) 2 % ointment 1 inch  1 inch Topical Q6H Jackolyn Confer, MD   1 inch at 12/03/16 1721  . ondansetron (ZOFRAN) injection 4 mg  4 mg Intravenous Q6H PRN Earnstine Regal, PA-C   4 mg at  12/02/16 1352  . pantoprazole (PROTONIX) injection 40 mg  40 mg Intravenous Q12H Earnstine Regal, PA-C   40 mg at 12/03/16 [id number]  . phenol (CHLORASEPTIC) mouth spray 1 spray  1 spray Mouth/Throat PRN Modena Jansky, MD   1 spray at 11/27/16 1215  . polyvinyl alcohol (LIQUIFILM TEARS) 1.4 % ophthalmic solution 1 drop  1 drop Both Eyes PRN Tawni Millers, MD      . potassium chloride 10 mEq in 100 mL IVPB  10 mEq Intravenous Q1 Hr x 6 Dara Hoyer, RPH   10 mEq at 12/03/16 1810  . prochlorperazine (COMPAZINE) injection 5-10 mg  5-10 mg Intravenous Q4H PRN Michael Boston, MD      . sodium chloride flush (NS) 0.9 % injection 10-40 mL  10-40 mL Intracatheter PRN Modena Jansky, MD   10 mL at 12/02/16 0522     Discharge Medications: Please see discharge summary for a list of discharge medications.  Relevant Imaging Results:  Relevant Lab Results:   Additional Information SSN:  [id number] [place], [place]

## 2016-12-03 NOTE — Progress Notes (Signed)
Patient ID: Melanie [name], female   DOB: Aug 10, 1936, 81 y.o.   MRN: [id number]  Patient ID: Melanie [name], female   DOB: [birth year], 81 y.o.   MRN: [id number]   9 Days Post-Op   Subjective: No new complaints. She has ongoing abdominal pain but states this is gradually better. Did a little better with FL diet yesterday. Having conversation with palliative care re disposition p hospitalization.  Objective: Vital signs in last 24 hours: Temp:  [97.6 F (36.4 C)-99.5 F (37.5 C)] 99.5 F (37.5 C) (02/25 0531) Pulse Rate:  [73-78] 78 (02/25 0531) Resp:  [13-16] 13 (02/25 0922) BP: (157-180)/(50-86) 162/50 (02/25 0531) SpO2:  [98 %-100 %] 99 % (02/25 0922) FiO2 (%):  [98 %] 98 % (02/25 0400) Weight:  [90.7 kg (200 lb)] 90.7 kg (200 lb) (02/25 0500) Last BM Date: 12/01/16  Intake/Output from previous day: 02/24 0701 - 02/25 0700 In: 2187.6 [I.V.:2187.6] Out: 2800 [Urine:2800] Intake/Output this shift: No intake/output data recorded.  General appearance: alert, cooperative, no distress and Normal affect Resp: No wheezing or increased work of breathing GI: Moderate diffuse tenderness. No distention. Incision/Wound: No erythema or drainage  Lab Results:   Recent Labs  12/02/16 0522  WBC 13.1*  HGB 8.0*  HCT 24.1*  PLT 393   BMET No results for input(s): NA, K, CL, CO2, GLUCOSE, BUN, CREATININE, CALCIUM in the last 72 hours.   Studies/Results: Dg Chest Port 1 View  Result Date: 12/02/2016 CLINICAL DATA:  Shortness of breath. EXAM: PORTABLE CHEST 1 VIEW COMPARISON:  11/29/2016 cavoatrial junction. FINDINGS: Right arm PICC line tip is in the projection of the cavoatrial junction. Mild cardiac enlargement. Pleural effusions are less apparent on today's study. No interstitial edema. IMPRESSION: 1. Decrease in appearance of bilateral pleural effusions. 2. No new findings. Electronically Signed   By: Kerby Moors M.D.   On: 12/02/2016 11:21    Anti-infectives: Anti-infectives    Start     Dose/Rate Route Frequency Ordered Stop   11/23/16 1800  piperacillin-tazobactam (ZOSYN) IVPB 3.375 g  Status:  Discontinued     3.375 g 12.5 mL/hr over 240 Minutes Intravenous Every 8 hours 11/23/16 1753 11/26/16 0839   11/17/16 0730  cefoTEtan (CEFOTAN) 2 g in dextrose 5 % 50 mL IVPB     2 g 100 mL/hr over 30 Minutes Intravenous On call to O.R. 11/17/16 0717 11/17/16 1040   11/04/16 0900  piperacillin-tazobactam (ZOSYN) IVPB 3.375 g  Status:  Discontinued     3.375 g 12.5 mL/hr over 240 Minutes Intravenous Every 8 hours 11/04/16 0833 11/17/16 0631   10/30/16 1600  piperacillin-tazobactam (ZOSYN) IVPB 3.375 g  Status:  Discontinued     3.375 g 12.5 mL/hr over 240 Minutes Intravenous Every 8 hours 10/30/16 1539 11/01/16 1814   10/29/16 1200  piperacillin-tazobactam (ZOSYN) IVPB 3.375 g  Status:  Discontinued     3.375 g 12.5 mL/hr over 240 Minutes Intravenous Every 8 hours 10/29/16 0941 10/30/16 1539      Assessment/Plan:  Admitted 1/20 - Medicine S/p Laparoscopic Cholecystectomy with intraoperative cholangiogram, 10/30/16, Dr. Rodman Key Martin;Necrotic gallbladder with free intrapertioneal bile; enlarged CBD with free flow into the duodenum POD 34 Exploratory laparotomy with LOA - findings: Mechanical obstruction, 11/17/16, Dr. Dalbert Batman POD 16 Exploratory laparotomy, LOA, reduction of SB volvulus 2/16- Dr. Zella Richer, POD 9  Swelling right arm - Doppler is negative for DVT   Chest pain with elevated troponin -improved.  Echo EF 0000000 1 diastolic dysfunction/Trivial MR- continue prn nitro  Anemia Stable post transfusion. Hgb - 8.6 - 11/30/2016 Severe protein calorie malnutrition -prealbumin 6.0 - 11/27/2016 on TNA.   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 - on chronic antibiotics History MRSA - On isolation  FEN: IV fluids, full liquids.  Cont [place] for now ID:  Zosyn 1/21- 11/16/16, on again 2/15-2/17.  No abscess at time of surgery. Currently no antibitotics.  WBC improved DVT prophylaxis: Lovenox   Plan: Palliative care is following to establish goals of care.              Continue to encourage ambulation/PT/OT             Continue full liquids and TPN. Needs to have more PO intake before weaning TPN                        Looking into possible SNF at d/c at this point   LOS: 36 days     LOS: 36 days    Bristal Steffy T 12/03/2016

## 2016-12-04 DIAGNOSIS — Z9889 Other specified postprocedural states: Secondary | ICD-10-CM

## 2016-12-04 LAB — COMPREHENSIVE METABOLIC PANEL
ALBUMIN: 1.8 g/dL — AB (ref 3.5–5.0)
ALK PHOS: 179 U/L — AB (ref 38–126)
ALT: 29 U/L (ref 14–54)
ANION GAP: 7 (ref 5–15)
AST: 23 U/L (ref 15–41)
BUN: 17 mg/dL (ref 6–20)
CALCIUM: 8.2 mg/dL — AB (ref 8.9–10.3)
CO2: 30 mmol/L (ref 22–32)
Chloride: 101 mmol/L (ref 101–111)
Creatinine, Ser: 0.45 mg/dL (ref 0.44–1.00)
GFR calc non Af Amer: >60 mL/min (ref >60)
Glucose, Bld: 132 mg/dL — ABNORMAL HIGH (ref 65–99)
POTASSIUM: 2.7 mmol/L — AB (ref 3.5–5.1)
SODIUM: 138 mmol/L (ref 135–145)
TOTAL PROTEIN: 5.8 g/dL — AB (ref 6.5–8.1)
Total Bilirubin: 0.5 mg/dL (ref 0.3–1.2)

## 2016-12-04 LAB — DIFFERENTIAL
BASOS ABS: 0 10*3/uL (ref 0.0–0.1)
BASOS PCT: 0 %
Eosinophils Absolute: 0.3 10*3/uL (ref 0.0–0.7)
Eosinophils Relative: 2 %
LYMPHS PCT: 17 %
Lymphs Abs: 1.9 10*3/uL (ref 0.7–4.0)
Monocytes Absolute: 0.9 10*3/uL (ref 0.1–1.0)
Monocytes Relative: 8 %
Neutro Abs: 8.2 10*3/uL — ABNORMAL HIGH (ref 1.7–7.7)
Neutrophils Relative %: 73 %

## 2016-12-04 LAB — GLUCOSE, CAPILLARY
GLUCOSE-CAPILLARY: 130 mg/dL — AB (ref 65–99)
Glucose-Capillary: 124 mg/dL — ABNORMAL HIGH (ref 65–99)
Glucose-Capillary: 119 mg/dL — ABNORMAL HIGH (ref 65–99)

## 2016-12-04 LAB — MAGNESIUM
Magnesium: 1.5 mg/dL — ABNORMAL LOW (ref 1.7–2.4)
Magnesium: 2.3 mg/dL (ref 1.7–2.4)

## 2016-12-04 LAB — CBC
HEMATOCRIT: 23.2 % — AB (ref 36.0–46.0)
Hemoglobin: 7.7 g/dL — ABNORMAL LOW (ref 12.0–15.0)
MCH: 26.8 pg (ref 26.0–34.0)
MCHC: 33.2 g/dL (ref 30.0–36.0)
MCV: 80.8 fL (ref 78.0–100.0)
PLATELETS: 375 10*3/uL (ref 150–400)
RBC: 2.87 MIL/uL — AB (ref 3.87–5.11)
RDW: 17.4 % — AB (ref 11.5–15.5)
WBC: 11.2 10*3/uL — AB (ref 4.0–10.5)

## 2016-12-04 LAB — PREALBUMIN: Prealbumin: 10.2 mg/dL — ABNORMAL LOW (ref 18–38)

## 2016-12-04 LAB — PHOSPHORUS: PHOSPHORUS: 3.5 mg/dL (ref 2.5–4.6)

## 2016-12-04 LAB — POTASSIUM: Potassium: 3 mmol/L — ABNORMAL LOW (ref 3.5–5.1)

## 2016-12-04 LAB — TRIGLYCERIDES: TRIGLYCERIDES: 97 mg/dL (ref <150)

## 2016-12-04 MED ORDER — HYDROMORPHONE HCL 1 MG/ML IJ SOLN: 0.5000 mg | INTRAMUSCULAR | Status: DC | PRN

## 2016-12-04 MED ORDER — MAGNESIUM SULFATE 4 GM/100ML IV SOLN
4.0000 g | Freq: Once | INTRAVENOUS | Status: AC
  Administered 2016-12-04: 4 g via INTRAVENOUS
  Filled 2016-12-04: qty 100

## 2016-12-04 MED ORDER — TRACE MINERALS CR-CU-MN-SE-ZN 10-1000-500-60 MCG/ML IV SOLN
83.0000 mL/h | INTRAVENOUS | Status: AC
  Administered 2016-12-04: 18:00:00 via INTRAVENOUS
  Filled 2016-12-04: qty 1992

## 2016-12-04 MED ORDER — OXYCODONE HCL 5 MG PO TABS
5.0000 mg | ORAL_TABLET | ORAL | Status: DC | PRN
  Administered 2016-12-06 – 2016-12-09 (×2): 10 mg via ORAL
  Filled 2016-12-04 (×2): qty 2

## 2016-12-04 MED ORDER — POTASSIUM CHLORIDE 10 MEQ/100ML IV SOLN
10.0000 meq | INTRAVENOUS | Status: AC
  Administered 2016-12-04 (×3): 10 meq via INTRAVENOUS
  Filled 2016-12-04 (×3): qty 100

## 2016-12-04 MED ORDER — ACETAMINOPHEN 500 MG PO TABS
1000.0000 mg | ORAL_TABLET | Freq: Four times a day (QID) | ORAL | Status: DC
  Administered 2016-12-04 – 2016-12-09 (×19): 1000 mg via ORAL
  Filled 2016-12-04 (×20): qty 2

## 2016-12-04 MED ORDER — FAT EMULSION 20 % IV EMUL
240.0000 mL | INTRAVENOUS | Status: AC
  Administered 2016-12-04: 240 mL via INTRAVENOUS
  Filled 2016-12-04: qty 250

## 2016-12-04 MED ORDER — IPRATROPIUM-ALBUTEROL 0.5-2.5 (3) MG/3ML IN SOLN
3.0000 mL | Freq: Once | RESPIRATORY_TRACT | Status: AC
  Administered 2016-12-04: 3 mL via RESPIRATORY_TRACT
  Filled 2016-12-04: qty 3

## 2016-12-04 NOTE — Care Management Important Message (Signed)
Important Message  Patient DetailsIM Letter given to [name]/Case Manager to present to Patient  Name: Melanie [name] MRN: [id number] Date of Birth: 11-06-1935   Medicare Important Message Given:  Yes    Kerin Salen 12/04/2016, 10:19 AM

## 2016-12-04 NOTE — Progress Notes (Signed)
10 Days Post-Op  Subjective: Complaint of abdominal pain, and is still using the PCA.  3.8 mg dilaudid via PCA yesterday Still on Nitro paste also.   She has not eaten anything so far today Objective: Vital signs in last 24 hours: Temp:  [98.4 F (36.9 C)-98.7 F (37.1 C)] 98.7 F (37.1 C) (02/26 0600) Pulse Rate:  [75-88] 80 (02/26 0600) Resp:  [10-15] 10 (02/26 1040) BP: (143-165)/(45-56) 143/53 (02/26 0600) SpO2:  [98 %-100 %] 100 % (02/26 1040) Weight:  [90.3 kg (199 lb 1.2 oz)] 90.3 kg (199 lb 1.2 oz) (02/26 0600) Last BM Date: 12/01/16 1900 IV Urine 4100 BM x 2 Afebrile BP OK Wt 90.3 KG down from 96 on 2/21 wt 76.4 KG on 11/20/16 K+ 2.7 Prealbumin 10.2  H/H down to 7.7/23.2  Intake/Output from previous day: 02/25 0701 - 02/26 0700 In: 1913.5 [I.V.:1213.5; IV Piggyback:700] Out: 4100 [Urine:4100] Intake/Output this shift: No intake/output data recorded.  General appearance: alert, cooperative and no distress Resp: clear to auscultation bilaterally GI: soft, non-tender; bowel sounds normal; no masses,  no organomegaly and staples abdominal incision looks fine.  Lab Results:   Recent Labs  12/02/16 0522 12/04/16 0441  WBC 13.1* 11.2*  HGB 8.0* 7.7*  HCT 24.1* 23.2*  PLT 393 375    BMET  Recent Labs  12/03/16 1325 12/04/16 0441  NA 136 138  K 2.4* 2.7*  CL 100* 101  CO2 28 30  GLUCOSE 121* 132*  BUN 17 17  CREATININE 0.47 0.45  CALCIUM 8.2* 8.2*   PT/INR No results for input(s): LABPROT, INR in the last 72 hours.   Recent Labs Lab 11/28/16 0344 11/30/16 0413 12/04/16 0441  AST 31 27 23   ALT 80* 47 29  ALKPHOS 255* 227* 179*  BILITOT 1.3* 0.6 0.5  PROT 6.1* 5.8* 5.8*  ALBUMIN 1.7* 1.7* 1.8*     Lipase     Component Value Date/Time   LIPASE 13 11/11/2016 0945     Studies/Results: No results found.  Medications: . bisacodyl  10 mg Rectal Daily  . cloNIDine  0.2 mg Transdermal Weekly  . cloNIDine  0.1 mg Per Tube Once  .  enoxaparin (LOVENOX) injection  40 mg Subcutaneous Q24H  . feeding supplement (ENSURE ENLIVE)  237 mL Oral BID BM  . feeding supplement (PRO-STAT SUGAR FREE 64)  30 mL Oral BID  . furosemide  20 mg Oral BID  . hydrALAZINE  50 mg Oral Q6H  . hydrochlorothiazide  25 mg Oral Daily  . HYDROmorphone   Intravenous Q4H  . insulin aspart  0-9 Units Subcutaneous Q8H  . ipratropium-albuterol  3 mL Nebulization Once  . lip balm  1 application Topical BID  . metoprolol tartrate  50 mg Oral BID  . mirtazapine  7.5 mg Oral QHS  . nitroGLYCERIN  1 inch Topical Q6H  . pantoprazole (PROTONIX) IV  40 mg Intravenous Q12H  . potassium chloride  10 mEq Intravenous Q1 Hr x 3    Assessment/Plan Admitted 1/20 - Medicine S/p Laparoscopic Cholecystectomy with intraoperative cholangiogram, 10/30/16, Dr. Rodman Key Martin;Necrotic gallbladder with free intrapertioneal bile; enlarged CBD with free flow into the duodenum POD 34 Exploratory laparotomy with LOA - findings: Mechanical obstruction, 11/17/16, Dr. Dalbert Batman POD 16 Exploratory laparotomy, LOA, reduction of SB volvulus 2/16- Dr. Zella Richer, POD 9  Swelling right arm - Doppler is negative for DVT Hypokalemia - being replaced  Chest pain with elevated troponin -improved.  Echo EF 0000000 1 diastolic dysfunction/Trivial MR- continue  prn nitro Anemia Stable post transfusion. Hgb - 8.6 - 11/30/2016 Severe protein calorie malnutrition -prealbumin 6.0 - 11/27/2016 on TNA.   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 - on chronic antibiotics History MRSA - On isolation FEN: IV fluids, full liquids.  Cont [place] for now ID: Zosyn 1/21- 11/16/16, on again 2/15-2/17.  No abscess at time of surgery. Currently no antibitotics.  WBC improved DVT prophylaxis: Lovenox      Plan:  D/C PCA and nitro paste.  She needs to be mobilized and try eating more.   Slow ongoing diuresis, K+ being replaced.  On Hctz and lasix, Will discuss with Medicine. I have stopped her Hctz for now.  She is afraid she is not going to get better.  I have told her there is no reason she cannot get back to base line.       LOS: 37 days    Melanie [name] 12/04/2016 [phone]

## 2016-12-04 NOTE — Progress Notes (Signed)
Physical Therapy Treatment Patient Details Name: Melanie [name] MRN: [id number] DOB: [birth year] Today's Date: 12/04/2016    History of Present Illness 81 y.o. female with medical history significant of gallstones presents to the hospital with the chief complaint of abdominal pain. s/p laparoscopic cholecystectomy 10/30/16. Post op ileus, Exploratory laparotomy with LOA - findings:  Mechanical obstruction, 11/17/16, Dr. Dalbert Batman; Patient had third surgical intervention, emergent laparatomy d/t recurrent bowel obstruction with volvulus with ischemic segment, 2/16    PT Comments    Mobility limited by fatigue on today. Assisted pt out of bed to use bsc. Stand pivot x 2, bed<>bsc, with RW. Increased time and effort to complete all tasks. Pt was unable to tolerate ambulation on today. She politely requested PT check back another day. Will continue to follow and progress activity as tolerated.     Follow Up Recommendations  SNF     Equipment Recommendations  None recommended by PT    Recommendations for Other Services       Precautions / Restrictions Precautions Precautions: Fall Restrictions Weight Bearing Restrictions: No    Mobility  Bed Mobility Overal bed mobility: Needs Assistance Bed Mobility: Supine to Sit;Sit to Supine     Supine to sit: Min assist;HOB elevated Sit to supine: Min assist;HOB elevated   General bed mobility comments: Assist for trunk and LEs. Increased time. Pt relied on bedrail.   Transfers Overall transfer level: Needs assistance Equipment used: Rolling walker (2 wheeled) Transfers: Sit to/from Stand Sit to Stand: Min assist;From elevated surface Stand pivot transfers: Min assist       General transfer comment: Assist to rise, stabilize, control descent. Cues for hand placement. Increased time. Stand pivot, bed<>bsc, with RW. Pt fatigued.  Ambulation/Gait             General Gait Details: Pt politely requested PT check back another day to help  with ambulation.    Stairs            Wheelchair Mobility    Modified Rankin (Stroke Patients Only)       Balance Overall balance assessment: Needs assistance           Standing balance-Leahy Scale: Poor                      Cognition Arousal/Alertness: Awake/alert Behavior During Therapy: WFL for tasks assessed/performed Overall Cognitive Status: Within Functional Limits for tasks assessed                      Exercises General Exercises - Upper Extremity Shoulder Flexion: AROM;Both;Strengthening;10 reps Shoulder ABduction: AROM;Both;10 reps Elbow Flexion: AROM;Both;Strengthening;20 reps Elbow Extension: AROM;Both;Strengthening;20 reps Wrist Flexion: AROM;Both;20 reps Wrist Extension: AROM;Both;20 reps    General Comments        Pertinent Vitals/Pain Pain Assessment: Faces Pain Score: 5  Faces Pain Scale: Hurts little more Pain Location: abd Pain Descriptors / Indicators: Sore Pain Intervention(s): Limited activity within patient's tolerance;Repositioned    Home Living                      Prior Function            PT Goals (current goals can now be found in the care plan section) Acute Rehab PT Goals Patient Stated Goal: return to church activities Progress towards PT goals: Progressing toward goals (slowly)    Frequency    Min 3X/week      PT Plan Current plan remains appropriate  Co-evaluation             End of Session   Activity Tolerance: Patient limited by fatigue Patient left: in bed;with call bell/phone within reach;with bed alarm set;with family/visitor present   PT Visit Diagnosis: Muscle weakness (generalized) (M62.81);Difficulty in walking, not elsewhere classified (R26.2)     Time: [id number] PT Time Calculation (min) (ACUTE ONLY): 17 min  Charges:  $Therapeutic Activity: 8-22 mins                    G Codes:      Weston Anna, MPT Pager: [phone]

## 2016-12-04 NOTE — Progress Notes (Signed)
Brief Pharmacy note:  K=2.7  TPN patient with K=2.7 this am.  I ordered 3 runs of K. Dayshift will f/u if additional runs are needed.   Thanks [name] 12/04/2016 6:35 AM

## 2016-12-04 NOTE — Progress Notes (Signed)
Taking over care of patient. Agree with previous RN assessment. Patient denies any needs at this time. Will continue to monitor.  

## 2016-12-04 NOTE — Progress Notes (Signed)
CONSULT NOTE    Melanie [name]  [id number] DOB: 1935-10-20 DOA: 10/28/2016 PCP: Osborne Casco, MD   Brief Narrative:  Melanie [name] is a 80 y.o. female with prolonged hospitalization, admitted on 01/20 for necrotic cholecystitis. Developed post op bowel obstruction, re-intervened on 02/09 with exploratory laparotomy. NG tube in place, on TPN. Consulted for chest pain that ruled out for ACS. Patient has been in persistent pain, on PCA pump. Patient had third surgical intervention, emergent laparatomy, recurrent bowel obstruction with volvulus with ischemic segment. Resumed clonidine for blood pressure control. Worsening hypertension, started on nicardipine drip.  Clonidine dose increase to 0.2 mg, patient now off nicardipine. Palliative care discussions.   Assessment & Plan:   Principal Problem:   Small bowel obstruction s/p ex lap & lysis of adhesions 11/17/2016 Active Problems:   Necrotic cholecystitis s/p lap cholecystectomy 10/30/2016   Essential hypertension   Hyperlipidemia   COPD (chronic obstructive pulmonary disease) (HCC)   GERD (gastroesophageal reflux disease)   Chest pain   H/O exploratory laparotomy   Anxiousness   Pressure injury of skin  Necrotic cholecystitis -management per primary team (surgery)  Post-operative bowel obstruction Volvulus Ischemic bowel S/p Zosyn. On TPN -per primary  Atypical Chest Pain Resolved. -continue nitro sublingual prn  V-tach Patient had asymptomatic episode of v-tach consisting of 22 beats. Normal QTc. Resolved.  Hypertension Remains poorly controlled -continue hydralazine 50mg  -continue metoprolol -continue clonidine patch -continue hydrochlorothiazide  COPD Wheezing improved. -Duoneb QID prn  Anemia Stable. - Transfuse for Hgb < 7  Dyspnea Possibly secondary to pleural effusion versus underlying COPD. No wheezing. Unchanged. CXR shows improving pleural effusion. Mostly on exertion. -continue lasix  PO -strict in/out -weights -encouraged to use Duoneb. Will place a one time scheduled dose. Continue PRN dosing  Hypokalemia Repletion by pharmacy as she is on TPN   DVT prophylaxis: Lovenox Code Status: Full Family Communication: son at bedside Disposition Plan: Per surgical team   Procedures:   Laparoscopic cholecystectomy 10/30/16  Exploratory laparotomy with LOA 11/17/16  ECHO 2/12- EF 60-65%, G1DD.  NG (1/30-2/4, 2/9- )  Exploratory laparotomy (2/16)  Antimicrobials:  Zosyn (1/20-1/24, 1/27-2/9, 2/15-2/18)   Subjective: Dyspnea is with exertion. Not at rest. No chest pain. Abdominal pain has improved some.   Objective: Vitals:   12/04/16 0045 12/04/16 0430 12/04/16 0600 12/04/16 1040  BP:   (!) 143/53   Pulse:   80   Resp: 12 12 11 10   Temp:   98.7 F (37.1 C)   TempSrc:   Oral   SpO2: 100% 99% 99% 100%  Weight:   90.3 kg (199 lb 1.2 oz)   Height:        Intake/Output Summary (Last 24 hours) at 12/04/16 1050 Last data filed at 12/04/16 0600  Gross per 24 hour  Intake           1913.5 ml  Output             4100 ml  Net          -2186.5 ml   Filed Weights   12/02/16 0532 12/03/16 0500 12/04/16 0600  Weight: 92.9 kg (204 lb 12.9 oz) 90.7 kg (200 lb) 90.3 kg (199 lb 1.2 oz)    Examination:  General exam: well appearing, no distress Respiratory system: mild end expiratory rhonchi on left side. Respiratory effort normal. Cardiovascular system: S1 & S2 heard, RRR. No murmurs Gastrointestinal system: Minimal diffuse tenderness without guarding. Slightly distended. Incision site without erythema or tenderness  Central nervous system: Alert and oriented. No focal neurological deficits. Extremities: Edema or right hand resolved. 2+ LE pitting edema. No calf tenderness Skin: No cyanosis. No rashes Psychiatry: Judgement and insight appear normal. Mood & affect depressed.     Data Reviewed: I have personally reviewed following labs and imaging  studies  CBC:  Recent Labs Lab 11/28/16 0344 11/29/16 0835 11/30/16 0413 12/02/16 0522 12/04/16 0441  WBC 18.9* 16.4* 16.5* 13.1* 11.2*  NEUTROABS  --   --   --   --  8.2*  HGB 9.1* 8.9* 8.6* 8.0* 7.7*  HCT 26.6* 26.9* 25.5* 24.1* 23.2*  MCV 79.9 80.5 80.7 80.6 80.8  PLT 409* 403* 397 393 [id number]   Basic Metabolic Panel:  Recent Labs Lab 11/28/16 0344 11/30/16 0413 12/03/16 1325 12/04/16 0441  NA 135 137 136 138  K 3.4* 3.5 2.4* 2.7*  CL 107 109 100* 101  CO2 22 23 28 30   GLUCOSE 135* 126* 121* 132*  BUN 17 18 17 17   CREATININE 0.42* 0.40* 0.47 0.45  CALCIUM 8.0* 7.9* 8.2* 8.2*  MG 1.8 1.7  --  1.5*  PHOS  --  3.8  --  3.5   GFR: Estimated Creatinine Clearance: 60.6 mL/min (by C-G formula based on SCr of 0.45 mg/dL). Liver Function Tests:  Recent Labs Lab 11/28/16 0344 11/30/16 0413 12/04/16 0441  AST 31 27 23   ALT 80* 47 29  ALKPHOS 255* 227* 179*  BILITOT 1.3* 0.6 0.5  PROT 6.1* 5.8* 5.8*  ALBUMIN 1.7* 1.7* 1.8*   No results for input(s): LIPASE, AMYLASE in the last 168 hours. No results for input(s): AMMONIA in the last 168 hours. Coagulation Profile: No results for input(s): INR, PROTIME in the last 168 hours. Cardiac Enzymes:  Recent Labs Lab 11/27/16 1209  TROPONINI 0.11*   BNP (last 3 results) No results for input(s): PROBNP in the last 8760 hours. HbA1C: No results for input(s): HGBA1C in the last 72 hours. CBG:  Recent Labs Lab 12/02/16 2108 12/03/16 0528 12/03/16 1454 12/03/16 2129 12/04/16 0607  GLUCAP 122* 136* 113* 129* 130*   Lipid Profile:  Recent Labs  12/04/16 0441  TRIG 97   Thyroid Function Tests: No results for input(s): TSH, T4TOTAL, FREET4, T3FREE, THYROIDAB in the last 72 hours. Anemia Panel: No results for input(s): VITAMINB12, FOLATE, FERRITIN, TIBC, IRON, RETICCTPCT in the last 72 hours. Sepsis Labs: No results for input(s): PROCALCITON, LATICACIDVEN in the last 168 hours.  Recent Results (from the past  240 hour(s))  Culture, blood (Routine X 2) w Reflex to ID Panel     Status: None   Collection Time: 11/24/16  1:45 PM  Result Value Ref Range Status   Specimen Description BLOOD LEFT HAND  Final   Special Requests BOTTLES DRAWN AEROBIC AND ANAEROBIC 5CC EACH  Final   Culture   Final    NO GROWTH 5 DAYS Performed at [facility], [place] [address] [place], [place] [address]    Report Status 11/29/2016 FINAL  Final  Culture, blood (Routine X 2) w Reflex to ID Panel     Status: None   Collection Time: 11/24/16  1:50 PM  Result Value Ref Range Status   Specimen Description BLOOD LEFT HAND  Final   Special Requests IN PEDIATRIC BOTTLE .[place]  Final   Culture   Final    NO GROWTH 5 DAYS Performed at [facility], [place] [address], [place], [place] [address]    Report Status 11/29/2016  FINAL  Final  MRSA PCR Screening     Status: Abnormal   Collection Time: 11/28/16 12:40 PM  Result Value Ref Range Status   MRSA by PCR POSITIVE (A) NEGATIVE Final    Comment:        The GeneXpert MRSA Assay (FDA approved for NASAL specimens only), is one component of a comprehensive MRSA colonization surveillance program. It is not intended to diagnose MRSA infection nor to guide or monitor treatment for MRSA infections. RESULT CALLED TO, READ BACK BY AND VERIFIED WITH: F.AKAND RN AT [id number] ON 11/28/16 BY S.VANHOORNE          Radiology Studies: No results found.      Scheduled Meds: . bisacodyl  10 mg Rectal Daily  . cloNIDine  0.2 mg Transdermal Weekly  . cloNIDine  0.1 mg Per Tube Once  . enoxaparin (LOVENOX) injection  40 mg Subcutaneous Q24H  . feeding supplement (ENSURE ENLIVE)  237 mL Oral BID BM  . feeding supplement (PRO-STAT SUGAR FREE 64)  30 mL Oral BID  . furosemide  20 mg Oral BID  . hydrALAZINE  50 mg Oral Q6H  . hydrochlorothiazide  25 mg Oral Daily  . HYDROmorphone   Intravenous Q4H  . insulin aspart  0-9 Units Subcutaneous Q8H  . ipratropium-albuterol  3 mL  Nebulization Once  . lip balm  1 application Topical BID  . metoprolol tartrate  50 mg Oral BID  . mirtazapine  7.5 mg Oral QHS  . nitroGLYCERIN  1 inch Topical Q6H  . pantoprazole (PROTONIX) IV  40 mg Intravenous Q12H  . potassium chloride  10 mEq Intravenous Q1 Hr x 3   Continuous Infusions: . Marland KitchenTPN (CLINIMIX-E) Adult 83 mL/hr (12/03/16 1810)     LOS: 21 days    Cordelia Poche [facility] 12/04/2016, 10:50 AM Pager: [phone]  If 7PM-7AM, please contact night-coverage [email or web address] Password [email or web address]

## 2016-12-04 NOTE — Progress Notes (Signed)
Pharmacy: Re- potassium and magnesium  Patient's [name] with prolonged ileus currently on TPN.  Magnesium was 1.5 this morning with 4 gm of magnesium sulfate given at 1323 and potassium was 2.7 with 5/6 runs of KCL IV given today (last run given at 2009).   Repeat Magnesium level at  2009 resulted back as 2.3 (at goal) and potassium as 3 (low).  However, patient has not completed the 3 KCL runs ordered for this evening.  Plan: - with last potassium run (9PM dose) still pending to be given, will hold off on giving any more KCL runs-- will f/u with labs in AM and replete if needed  Dia Sitter, PharmD, BCPS 12/04/2016 9:41 PM

## 2016-12-04 NOTE — Progress Notes (Signed)
Occupational Therapy Treatment Patient Details Name: KENNETTA ACOMB MRN: [id number] DOB: 02/07/1936 Today's Date: 12/04/2016    History of present illness 81 y.o. female with medical history significant of gallstones presents to the hospital with the chief complaint of abdominal pain. s/p laparoscopic cholecystectomy 10/30/16. Post op ileus, Exploratory laparotomy with LOA - findings:  Mechanical obstruction, 11/17/16, Dr. Dalbert Batman; Patient had third surgical intervention, emergent laparatomy d/t recurrent bowel obstruction with volvulus with ischemic segment, 2/16   OT comments  Pt agreeable to BUE exercise program in bed with HOB raised.  Pt did well but did need breaks.  Pts sat over 90 entire OT session with BUE exercise   Follow Up Recommendations  SNF    Equipment Recommendations  Other (comment)       Precautions / Restrictions Precautions Precautions: Fall Restrictions Weight Bearing Restrictions: No       Mobility Bed Mobility            NT      Transfers        NT                  ADL Overall ADL's : Needs assistance/impaired                                       General ADL Comments: goal added for BUE exercise/strengthening .  Pt agreeable to BUe exercise but not getting OOB.  Pt did well with BUE Exercise and OT explained benefits of BUE exercise.                Cognition   Behavior During Therapy: WFL for tasks assessed/performed Overall Cognitive Status: Within Functional Limits for tasks assessed                         Exercises General Exercises - Upper Extremity Shoulder Flexion: AROM;Both;Strengthening;10 reps Shoulder ABduction: AROM;Both;10 reps Elbow Flexion: AROM;Both;Strengthening;20 reps Elbow Extension: AROM;Both;Strengthening;20 reps Wrist Flexion: AROM;Both;20 reps Wrist Extension: AROM;Both;20 reps      General Comments  Pt needed multiple rest break and increased time but did work through and  completed exercise    Pertinent Vitals/ Pain       Pain Score: 5  Pain Location: abd Pain Descriptors / Indicators: Sore Pain Intervention(s): Monitored during session         Frequency  Min 2X/week        Progress Toward Goals  OT Goals(current goals can now be found in the care plan section)     Acute Rehab OT Goals Patient Stated Goal: return to church activities ADL Goals Additional ADL Goal #2: Pt will peform BUE HEP to increase strength in in order for pt tobe more I with ADL activity  Plan Discharge plan remains appropriate    Co-evaluation                 End of Session    OT Visit Diagnosis: Unsteadiness on feet (R26.81);Muscle weakness (generalized) (M62.81)   Activity Tolerance Patient limited by fatigue   Patient Left in bed   Nurse Communication Mobility status        Time: 1247-1310 OT Time Calculation (min): 23 min  Charges: OT General Charges $OT Visit: 1 Procedure OT Treatments $Therapeutic Exercise: 23-37 mins  [place], [place]   Betsy Pries 12/04/2016, 2:02 PM

## 2016-12-04 NOTE — Progress Notes (Signed)
[facility] NOTE   Pharmacy Consult for TPN Indication: prolonged ileus  Patient Measurements: Height: 5' 2"  (157.5 cm) Weight: 199 lb 1.2 oz (90.3 kg) IBW/kg (Calculated) : 50.1 TPN AdjBW (KG): 60.9 Body mass index is 36.41 kg/m.  Insulin Requirements: 2 Unit SSI/ 24 hr  Current Nutrition: Full liquids, Ensure daily and ProStat BID  IVF: none  Central access: PICC TPN start date: 11/08/16  ASSESSMENT                                                                   HPI:  81 y.o F with hx of gallstones presented to the ED on 1/20 with c/o abdominal pain. MRCP on 1/22 showed distended gallbladder with tiny gallstones and wall thickening with suspicion for acute cholecystitis. Patient underwent lap cholecystectomy with intraoperative cholangiogram on 1/22.  To start TPN for prolonged ileus and poor oral intake.  Significant events:  1/22: lap chole with cholangiogram 1/30: abd KUB with suspicion for post-op ileus; NG tube placed  2/2: high NG output from 1/31-2/1 (1335m) but decreased 2/1-2/2 (300 ml). RN reported foul smell from NG tube --CCS may d/c NG 2/3: trial clamping NG - successful 2/4 NGT removed. Switching from E 5/20 to E 5/15 to better align with nutritional goals 2/6 leave NGT out, continue NPO, GI consult 2/8 Xray consistent with ileus or SBO - possible ex lap tomorrow 2/9: Underwent exploratory laparotomy, lysis of adhesions.  2/16: back to OR for recurrent SBO.  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 2/21: starting clear liquids; fluid-overloaded per CCS, starting lasix and stopping IVF. Nutrition requesting increasing goal rate to give full 2L daily (83 ml/hr) as patient with several surgeries since last assessment 2/23-4: taking Pro-stat bid, total 30gm protein/day. Added Ensure 2/24 - refused all 2/26: Ensure and ProStat recently documented but per RN negligible intake. RN also notes some bloody emesis  this AM  Today 12/04/16:   Glucose: no Hx DM, CBGs controlled  Electrolytes: K/Mg low, 60 mEq K given overnight with minimal improvement, an additional 30 mEq given by night pharmacist. Other lytes wnl. Note Pt on Lasix and HCTZ.  Renal: SCr/BUN ok; I/O likely not accurate at ~30L+ since admit, but does reflect almost 20 kg weight gain  LFTs:  Alk phos still minimally elevated but improving; AST/ALT improved to wnl. T bili now wnl, albumin remains low  TGs: stable wnl  Prealbumin: Low but improved  NUTRITIONAL GOALS                                                                                             RD recs (2/21) Kcal:  1600-1800kcal/day  Protein:  105-120g/day  Fluid:  >1.6L/day   Clinimix E 5/15 (switched from E 5/20 at 70 ml/hr due to availability) at a goal rate of 75 ml/hr + 20% fat emulsion at 20  ml/hr x 12 hr to provide:  90 g/day protein, 1758 Kcal/day.  Clinimix E 5/15 at 83 ml/hr + 20 Lipids at 20 ml/hr x 12 hr will provide 100gm protein, ~1900 kCal per day  (+++There is currently a national backorder of Clinimix solution. Pharmacy will use Clinimix product based on availability+++)  PLAN    Mg 4g IV x 1; given over 4 hrs to aid retention  Give an additional KCl 30 mEq IV  Surgery will hold HCTZ while on Lasix; suspect this is contributing to electrolyte deficiencies  Will recheck Mg/K this PM and given additional lytes if necessary  At 1800 today:  Continue Clinimix E 5/15 at 83 ml/hr as patient doesn't appear to be getting a significant amount of protein or calories from PO intake.  20% fat emulsion at 20 ml/hr over 12 hrs  Trace elements are on national shortage, will be added every other day (last added 2/26).  Multivitamins will be added daily.  Continue sensitive SSI q8h.   TPN lab panels on Mondays & Thursdays  CBC/Mg tomorrow AM  Follow up labs  Reuel Boom, PharmD, BCPS Pager: [phone] 12/04/2016, 12:11 PM

## 2016-12-04 NOTE — Progress Notes (Signed)
Daily Progress Note   Patient Name: Melanie [name]       Date: 12/04/2016 DOB: [birth year]  Age: 81 y.o. MRN#: [id number] Attending Physician: Nolon Nations, MD Primary Care Physician: Osborne Casco, MD Admit Date: 10/28/2016  Reason for Consultation/Follow-up: Establishing goals of care  Subjective:  awake alert  working on lunch, has eaten about 15 % No family at bedside currently  See below:   Length of Stay: 37  Current Medications: Scheduled Meds:  . acetaminophen  1,000 mg Oral Q6H  . bisacodyl  10 mg Rectal Daily  . cloNIDine  0.2 mg Transdermal Weekly  . cloNIDine  0.1 mg Per Tube Once  . enoxaparin (LOVENOX) injection  40 mg Subcutaneous Q24H  . feeding supplement (ENSURE ENLIVE)  237 mL Oral BID BM  . feeding supplement (PRO-STAT SUGAR FREE 64)  30 mL Oral BID  . furosemide  20 mg Oral BID  . hydrALAZINE  50 mg Oral Q6H  . insulin aspart  0-9 Units Subcutaneous Q8H  . lip balm  1 application Topical BID  . magnesium sulfate 1 - 4 g bolus IVPB  4 g Intravenous Once  . metoprolol tartrate  50 mg Oral BID  . mirtazapine  7.5 mg Oral QHS  . pantoprazole (PROTONIX) IV  40 mg Intravenous Q12H  . potassium chloride  10 mEq Intravenous Q1 Hr x 3    Continuous Infusions: . Marland KitchenTPN (CLINIMIX-E) Adult 83 mL/hr (12/03/16 1810)  . Marland KitchenTPN (CLINIMIX-E) Adult     And  . fat emulsion      PRN Meds: alum & mag hydroxide-simeth, chlorproMAZINE (THORAZINE) IV, diphenhydrAMINE, HYDROmorphone (DILAUDID) injection, ipratropium-albuterol, LORazepam, magic mouthwash, menthol-cetylpyridinium, oxyCODONE, phenol, polyvinyl alcohol, prochlorperazine, sodium chloride flush  Physical Exam         NAD S1 S2 Clear Abdomen mild generalized tenderness, midline incision healing well Has edema in  both UE and LE Awake alert non focal  Vital Signs: BP (!) 133/49 (BP Location: Left Arm)   Pulse 70   Temp 98.4 F (36.9 C) (Oral)   Resp 18   Ht 5\' 2"  (1.575 m)   Wt 90.3 kg (199 lb 1.2 oz)   SpO2 100%   BMI 36.41 kg/m  SpO2: SpO2: 100 % O2 Device: O2 Device: Nasal Cannula O2 Flow Rate: O2 Flow Rate (L/min):  2 L/min  Intake/output summary:   Intake/Output Summary (Last 24 hours) at 12/04/16 1453 Last data filed at 12/04/16 0600  Gross per 24 hour  Intake           1913.5 ml  Output             2500 ml  Net           -586.5 ml   LBM: Last BM Date: 12/01/16 Baseline Weight: Weight: 70.3 kg (155 lb) Most recent weight: Weight: 90.3 kg (199 lb 1.2 oz)       Palliative Assessment/Data:    Flowsheet Rows   Flowsheet Row Most Recent Value  Intake Tab  Referral Department  Surgery  Unit at Time of Referral  Med/Surg Unit  Palliative Care Primary Diagnosis  Other (Comment)  Date Notified  12/01/16  Palliative Care Type  New Palliative care  Reason for referral  Clarify Goals of Care  Date of Admission  10/28/16  Date first seen by Palliative Care  12/01/16  # of days Palliative referral response time  0 Day(s)  # of days IP prior to Palliative referral  34  Clinical Assessment  Palliative Performance Scale Score  20%  Pain Max last 24 hours  5  Pain Min Last 24 hours  4  Dyspnea Max Last 24 Hours  4  Dyspnea Min Last 24 hours  3  Psychosocial & Spiritual Assessment  Palliative Care Outcomes  Patient/Family meeting held?  Yes  Who was at the meeting?  patient daughter       Patient Active Problem List   Diagnosis Date Noted  . Pressure injury of skin 11/28/2016  . Anxiousness   . H/O exploratory laparotomy 11/24/2016  . Chest pain   . Small bowel obstruction s/p ex lap & lysis of adhesions 11/17/2016 11/17/2016  . Necrotic cholecystitis s/p lap cholecystectomy 10/30/2016 11/09/2016  . GERD (gastroesophageal reflux disease) 11/09/2016  . Essential hypertension    . Hyperlipidemia   . COPD (chronic obstructive pulmonary disease) [facility])     Palliative Care Assessment & Plan   Patient Profile:    Assessment:  Patient is a 81 year old lady admitted to the hospital since 1-20 for necrotic cholecystitis. Patient developed postop bowel obstruction, is a status post exploratory laparotomy the intervention. Patient initially also required NG tube, is still on TPN. Hospitalist medicine services have been following the patient as well. Patient was seen for chest discomfort was ruled out for acute coronary syndrome. Patient has remained on Dilaudid bolus only PCA. Patient also required exploratory laparotomy for recurrent bowel obstruction with volvulus. Hospital course also complicated by uncontrolled blood pressures requiring nicardipine drip. Patient remains with suboptimal oral intake in spite of diet being advanced to full liquids. She continues to have loose bowel movements. She continues to require PCA for abdominal discomfort.   Recommendations/Plan:    Remeron trial, patient encouraged to take Remeron. Consider inpatient Psych consult for additional recommendations if appropriate.   Remains a full code, patient states she continues to discuss with her children.  Disposition: will likely need SNF rehab    Code Status:    Code Status Orders        Start     Ordered   11/18/16 0758  Full code  Continuous     11/18/16 0758    Code Status History    Date Active Date Inactive Code Status Order ID Comments User Context   10/28/2016  4:28 PM 11/18/2016  7:58 AM  Full Code [id number]  Mauricio Gerome Apley, MD Inpatient    Advance Directive Documentation   Flowsheet Row Most Recent Value  Type of Advance Directive  Healthcare Power of Attorney  Pre-existing out of facility DNR order (yellow form or pink MOST form)  No data  "MOST" Form in Place?  No data       Prognosis:   guarded  Discharge Planning:  To Be Determined  Care plan was  discussed with  Patient, son.   Thank you for allowing the Palliative Medicine Team to assist in the care of this patient.   Time In: 1400 Time Out: 1425 Total Time 25 Prolonged Time Billed  no       Greater than 50%  of this time was spent counseling and coordinating care related to the above assessment and plan.  Loistine Chance, MD [phone]  Please contact Palliative Medicine Team phone at [phone] for questions and concerns.

## 2016-12-05 ENCOUNTER — Encounter (HOSPITAL_COMMUNITY): Payer: Medicare Other

## 2016-12-05 ENCOUNTER — Ambulatory Visit: Payer: Medicare Other | Admitting: Family

## 2016-12-05 LAB — BASIC METABOLIC PANEL WITH GFR
Anion gap: 8 (ref 5–15)
BUN: 20 mg/dL (ref 6–20)
CO2: 30 mmol/L (ref 22–32)
Calcium: 8.3 mg/dL — ABNORMAL LOW (ref 8.9–10.3)
Chloride: 103 mmol/L (ref 101–111)
Creatinine, Ser: 0.45 mg/dL (ref 0.44–1.00)
GFR calc Af Amer: >60 mL/min (ref >60)
GFR calc non Af Amer: >60 mL/min (ref >60)
Glucose, Bld: 124 mg/dL — ABNORMAL HIGH (ref 65–99)
Potassium: 3.1 mmol/L — ABNORMAL LOW (ref 3.5–5.1)
Sodium: 141 mmol/L (ref 135–145)

## 2016-12-05 LAB — GLUCOSE, CAPILLARY
Glucose-Capillary: 125 mg/dL — ABNORMAL HIGH (ref 65–99)
Glucose-Capillary: 131 mg/dL — ABNORMAL HIGH (ref 65–99)
Glucose-Capillary: 122 mg/dL — ABNORMAL HIGH (ref 65–99)

## 2016-12-05 LAB — MAGNESIUM: MAGNESIUM: 2.1 mg/dL (ref 1.7–2.4)

## 2016-12-05 MED ORDER — POTASSIUM CHLORIDE 10 MEQ/50ML IV SOLN
10.0000 meq | INTRAVENOUS | Status: AC
  Administered 2016-12-05 (×5): 10 meq via INTRAVENOUS
  Filled 2016-12-05 (×5): qty 50

## 2016-12-05 MED ORDER — INSULIN ASPART 100 UNIT/ML ~~LOC~~ SOLN
0.0000 [IU] | Freq: Three times a day (TID) | SUBCUTANEOUS | Status: DC
  Administered 2016-12-05: 1 [IU] via SUBCUTANEOUS

## 2016-12-05 MED ORDER — CLINIMIX E/DEXTROSE (5/15) 5 % IV SOLN
INTRAVENOUS | Status: AC
  Administered 2016-12-05: 17:00:00 via INTRAVENOUS
  Filled 2016-12-05: qty 1440

## 2016-12-05 MED ORDER — MAGNESIUM SULFATE IN D5W 1-5 GM/100ML-% IV SOLN
1.0000 g | Freq: Once | INTRAVENOUS | Status: AC
  Administered 2016-12-05: 1 g via INTRAVENOUS
  Filled 2016-12-05: qty 100

## 2016-12-05 MED ORDER — LOSARTAN POTASSIUM 50 MG PO TABS
25.0000 mg | ORAL_TABLET | Freq: Every day | ORAL | Status: DC
  Administered 2016-12-05 – 2016-12-06 (×2): 25 mg via ORAL
  Filled 2016-12-05 (×2): qty 1

## 2016-12-05 MED ORDER — FAT EMULSION 20 % IV EMUL
240.0000 mL | INTRAVENOUS | Status: AC
  Administered 2016-12-05: 240 mL via INTRAVENOUS
  Filled 2016-12-05: qty 240

## 2016-12-05 NOTE — Progress Notes (Signed)
Throughout the shift, patient has had frequent loose mucous type bowel movements. The last bowel movement the patient had at 1850 was quite red, appearing to be bloody. Hgb was not checked today but was 7.7 yesterday. VS stable. Will pass on to night shift RN to continue to monitor.   Callie Fielding RN

## 2016-12-05 NOTE — NC FL2 (Signed)
[place] LEVEL OF CARE SCREENING TOOL     IDENTIFICATION  Patient Name: Melanie [name] Birthdate: [birth year] Sex: female Admission Date (Current Location): 10/28/2016  [facility] [place] Number:  Herbalist and Address:  [facility],  [place] [address] [place]      Provider Number: [id number]  Attending Physician Name and Address:  Md Edison Pace, MD  Relative Name and Phone Number:       Current Level of Care: Hospital Recommended Level of Care: [place] Prior Approval Number:    Date Approved/Denied:   PASRR Number: [id number]  Discharge Plan: SNF    Current Diagnoses: Patient Active Problem List   Diagnosis Date Noted  . Pressure injury of skin 11/28/2016  . Anxiousness   . H/O exploratory laparotomy 11/24/2016  . Chest pain   . Small bowel obstruction s/p ex lap & lysis of adhesions 11/17/2016 11/17/2016  . Necrotic cholecystitis s/p lap cholecystectomy 10/30/2016 11/09/2016  . GERD (gastroesophageal reflux disease) 11/09/2016  . Essential hypertension   . Hyperlipidemia   . COPD (chronic obstructive pulmonary disease) (HCC)     Orientation RESPIRATION BLADDER Height & Weight     Self, Time, Situation, Place  Normal Continent Weight: 192 lb 7.4 oz (87.3 kg) Height:  5\' 2"  (157.5 cm)  BEHAVIORAL SYMPTOMS/MOOD NEUROLOGICAL BOWEL NUTRITION STATUS      Incontinent Diet (Soft)  AMBULATORY STATUS COMMUNICATION OF NEEDS Skin   Extensive Assist Verbally Other (Comment) (Partial thickness loss of dermis presenting as a shallow open ulcer with a red, pink wound bed without slough. (Location: Anus Location Orientation: Left;Right;Mid Staging))                       Personal Care Assistance Level of Assistance  Bathing, Dressing Bathing Assistance: Maximum assistance   Dressing Assistance: Maximum assistance     Functional Limitations Info  Sight, Hearing, Speech Sight Info: Adequate Hearing Info:  Adequate Speech Info: Adequate    SPECIAL CARE FACTORS FREQUENCY  PT (By licensed PT), OT (By licensed OT)     PT Frequency: 5x OT Frequency: 5x            Contractures Contractures Info: Not present    Additional Factors Info  Code Status, Allergies Code Status Info: Full Code Allergies Info: NSAIDS; Ace Inhibitors            Current Medications (12/05/2016):  This is the current hospital active medication list Current Facility-Administered Medications  Medication Dose Route Frequency Provider Last Rate Last Dose  . Marland KitchenTPN (CLINIMIX-E) Adult  83 mL/hr Intravenous Continuous TPN Polly Cobia, RPH 83 mL/hr at 12/04/16 1807    . acetaminophen (TYLENOL) tablet 1,000 mg  1,000 mg Oral Q6H Earnstine Regal, PA-C   1,000 mg at 12/05/16 1228  . alum & mag hydroxide-simeth (MAALOX/MYLANTA) 200-200-20 MG/5ML suspension 30 mL  30 mL Oral Q6H PRN Michael Boston, MD      . bisacodyl (DULCOLAX) suppository 10 mg  10 mg Rectal Daily Michael Boston, MD   10 mg at 12/04/16 1037  . chlorproMAZINE (THORAZINE) 25 mg in sodium chloride 0.9 % 25 mL IVPB  25 mg Intravenous Q6H PRN Michael Boston, MD      . cloNIDine (CATAPRES - Dosed in mg/24 hr) patch 0.2 mg  0.2 mg Transdermal Weekly Mauricio Gerome Apley, MD   0.2 mg at 12/04/16 1308  . diphenhydrAMINE (BENADRYL) injection 12.5-25 mg  12.5-25 mg Intravenous Q6H  PRN Michael Boston, MD      . enoxaparin (LOVENOX) injection 40 mg  40 mg Subcutaneous Q24H Fanny Skates, MD   40 mg at 12/05/16 0953  . TPN (CLINIMIX-E) Adult   Intravenous Continuous TPN Drew A Wofford, RPH       And  . fat emulsion 20 % infusion 240 mL  240 mL Intravenous Continuous TPN Polly Cobia, RPH      . feeding supplement (ENSURE ENLIVE) (ENSURE ENLIVE) liquid 237 mL  237 mL Oral BID BM Excell Seltzer, MD   237 mL at 12/03/16 1642  . feeding supplement (PRO-STAT SUGAR FREE 64) liquid 30 mL  30 mL Oral BID Mariel Aloe, MD   30 mL at 12/05/16 0954  . furosemide (LASIX) tablet  20 mg  20 mg Oral BID Mariel Aloe, MD   20 mg at 12/05/16 0744  . hydrALAZINE (APRESOLINE) tablet 50 mg  50 mg Oral Q6H Mariel Aloe, MD   50 mg at 12/05/16 1228  . HYDROmorphone (DILAUDID) injection 0.5-1 mg  0.5-1 mg Intravenous Q3H PRN Earnstine Regal, PA-C      . insulin aspart (novoLOG) injection 0-9 Units  0-9 Units Subcutaneous TID WC Drew A Wofford, RPH      . ipratropium-albuterol (DUONEB) 0.5-2.5 (3) MG/3ML nebulizer solution 3 mL  3 mL Nebulization QID PRN Mariel Aloe, MD      . lip balm (CARMEX) ointment 1 application  1 application Topical BID Michael Boston, MD   1 application at [id number] [phone]  . LORazepam (ATIVAN) injection 0.5-1 mg  0.5-1 mg Intravenous Q8H PRN Michael Boston, MD      . losartan (COZAAR) tablet 25 mg  25 mg Oral Daily Mariel Aloe, MD   25 mg at 12/05/16 [id number]  . magic mouthwash  15 mL Oral QID PRN Michael Boston, MD      . magnesium sulfate IVPB 1 g 100 mL  1 g Intravenous Once [employer], RPH   1 g at 12/05/16 1310  . menthol-cetylpyridinium (CEPACOL) lozenge 3 mg  1 lozenge Oral PRN Modena Jansky, MD      . metoprolol (LOPRESSOR) tablet 50 mg  50 mg Oral BID Alphonsa Overall, MD   50 mg at 12/05/16 [id number]  . mirtazapine (REMERON SOL-TAB) disintegrating tablet 7.5 mg  7.5 mg Oral QHS Loistine Chance, MD   7.5 mg at 12/04/16 2300  . oxyCODONE (Oxy IR/ROXICODONE) immediate release tablet 5-10 mg  5-10 mg Oral Q4H PRN Earnstine Regal, PA-C      . pantoprazole (PROTONIX) injection 40 mg  40 mg Intravenous Q12H Earnstine Regal, PA-C   40 mg at 12/05/16 [id number]  . phenol (CHLORASEPTIC) mouth spray 1 spray  1 spray Mouth/Throat PRN Modena Jansky, MD   1 spray at 11/27/16 1215  . polyvinyl alcohol (LIQUIFILM TEARS) 1.4 % ophthalmic solution 1 drop  1 drop Both Eyes PRN Tawni Millers, MD      . potassium chloride 10 mEq in 50 mL *CENTRAL LINE* IVPB  10 mEq Intravenous Q1 Hr x 5 Drew A Wofford, RPH      . prochlorperazine (COMPAZINE) injection 5-10 mg  5-10 mg  Intravenous Q4H PRN Michael Boston, MD      . sodium chloride flush (NS) 0.9 % injection 10-40 mL  10-40 mL Intracatheter PRN Modena Jansky, MD   10 mL at 12/02/16 0522     Discharge Medications: Please see discharge summary for a list  of discharge medications.  Relevant Imaging Results:  Relevant Lab Results:   Additional Information SSN [id number]  Burnis Medin, LCSW

## 2016-12-05 NOTE — Progress Notes (Signed)
Pt was using bedside commode when she started to have a nose bleed. Nose bleed only lasted about 2 minutes, checked pt oxygen level without O2 and was at 96% Room Air. Took pt off oxygen, figured the dry air from the nasal cannula could be making her have it. Pt says she's had plenty of nose bleeds in her life and that this was nothing abnormal to her. Continuing to monitor patient and check oxygen level and any more signs of bleeding.  Carmela Hurt, RN 2:31 AM 12/05/16

## 2016-12-05 NOTE — Progress Notes (Signed)
CONSULT NOTE    Melanie [name]  [id number] DOB: 06-06-1936 DOA: 10/28/2016 PCP: Osborne Casco, MD   Brief Narrative:  Melanie [name] is a 81 y.o. female with prolonged hospitalization, admitted on 01/20 for necrotic cholecystitis. Developed post op bowel obstruction, re-intervened on 02/09 with exploratory laparotomy. NG tube in place, on TPN. Consulted for chest pain that ruled out for ACS. Patient has been in persistent pain, on PCA pump. Patient had third surgical intervention, emergent laparatomy, recurrent bowel obstruction with volvulus with ischemic segment. Resumed clonidine for blood pressure control. Worsening hypertension, started on nicardipine drip.  Clonidine dose increase to 0.2 mg, patient now off nicardipine. Palliative care discussions.   Assessment & Plan:   Principal Problem:   Small bowel obstruction s/p ex lap & lysis of adhesions 11/17/2016 Active Problems:   Necrotic cholecystitis s/p lap cholecystectomy 10/30/2016   Essential hypertension   Hyperlipidemia   COPD (chronic obstructive pulmonary disease) (HCC)   GERD (gastroesophageal reflux disease)   Chest pain   H/O exploratory laparotomy   Anxiousness   Pressure injury of skin  Necrotic cholecystitis -management per primary team (surgery)  Post-operative bowel obstruction Volvulus Ischemic bowel S/p Zosyn. On TPN -per primary  Atypical Chest Pain Resolved. -continue nitro sublingual prn  V-tach Patient had asymptomatic episode of v-tach consisting of 22 beats. Normal QTc. Resolved.  Hypertension Remains poorly controlled and now with the addition of another antihypertensive, will explore secondary causes. -continue hydralazine 50mg  -continue metoprolol -continue clonidine patch -start losartan -renin/aldosterone -renal artery duplex  COPD Wheezing improved. -Duoneb QID prn  Anemia Stable. - Transfuse for Hgb < 7  Dyspnea Possibly secondary to pleural effusion versus  underlying COPD. No wheezing. Unchanged. CXR shows improving pleural effusion. Mostly on exertion. -continue lasix PO -strict in/out -weights -encouraged to use Duoneb. Will place a one time scheduled dose. Continue PRN dosing  Hypokalemia Repletion by pharmacy as she is on TPN  Chronic diastolic heart failure Grade 2 diastolic heart failure with EF of 60-65%.  -continue lasix   DVT prophylaxis: Lovenox Code Status: Full Family Communication: son at bedside Disposition Plan: Per surgical team   Procedures:   Laparoscopic cholecystectomy 10/30/16  Exploratory laparotomy with LOA 11/17/16  ECHO 2/12- EF 60-65%, G1DD.  NG (1/30-2/4, 2/9- )  Exploratory laparotomy (2/16)  Antimicrobials:  Zosyn (1/20-1/24, 1/27-2/9, 2/15-2/18)   Subjective: Dyspnea is baseline. No chest pain. Mild abdominal pain.  Objective: Vitals:   12/04/16 1304 12/04/16 1949 12/05/16 0547 12/05/16 0929  BP: (!) 133/49 (!) 176/81 (!) 176/62 (!) 162/58  Pulse: 70 80 80 78  Resp: 18 18 18 18   Temp: 98.4 F (36.9 C) 98.1 F (36.7 C) 98 F (36.7 C) 98.2 F (36.8 C)  TempSrc: Oral Oral Oral Oral  SpO2: 100% 99% 95% 95%  Weight:   87.3 kg (192 lb 7.4 oz)   Height:        Intake/Output Summary (Last 24 hours) at 12/05/16 1130 Last data filed at 12/05/16 0600  Gross per 24 hour  Intake          2073.99 ml  Output              300 ml  Net          1773.99 ml   Filed Weights   12/03/16 0500 12/04/16 0600 12/05/16 0547  Weight: 90.7 kg (200 lb) 90.3 kg (199 lb 1.2 oz) 87.3 kg (192 lb 7.4 oz)    Examination:  General exam: well appearing,  no distress Respiratory system: Clear to auscultation bilaterally. Normal respiratory effort Cardiovascular system: S1 & S2 heard, RRR. No murmurs Gastrointestinal system: Minimal diffuse tenderness without guarding. Slightly distended. Incision site without erythema or tenderness Central nervous system: Alert and oriented. No focal neurological  deficits. Extremities: Edema or right hand resolved. 1+ LE pitting edema. No calf tenderness Skin: No cyanosis. No rashes Psychiatry: Judgement and insight appear normal. Mood & affect depressed.     Data Reviewed: I have personally reviewed following labs and imaging studies  CBC:  Recent Labs Lab 11/29/16 0835 11/30/16 0413 12/02/16 0522 12/04/16 0441  WBC 16.4* 16.5* 13.1* 11.2*  NEUTROABS  --   --   --  8.2*  HGB 8.9* 8.6* 8.0* 7.7*  HCT 26.9* 25.5* 24.1* 23.2*  MCV 80.5 80.7 80.6 80.8  PLT 403* [phone] [id number]   Basic Metabolic Panel:  Recent Labs Lab 11/30/16 0413 12/03/16 1325 12/04/16 0441 12/04/16 2009 12/05/16 0401  NA 137 136 138  --  141  K 3.5 2.4* 2.7* 3.0* 3.1*  CL 109 100* 101  --  103  CO2 23 28 30   --  30  GLUCOSE 126* 121* 132*  --  124*  BUN 18 17 17   --  20  CREATININE 0.40* 0.47 0.45  --  0.45  CALCIUM 7.9* 8.2* 8.2*  --  8.3*  MG 1.7  --  1.5* 2.3 2.1  PHOS 3.8  --  3.5  --   --    GFR: Estimated Creatinine Clearance: 59.5 mL/min (by C-G formula based on SCr of 0.45 mg/dL). Liver Function Tests:  Recent Labs Lab 11/30/16 0413 12/04/16 0441  AST 27 23  ALT 47 29  ALKPHOS 227* 179*  BILITOT 0.6 0.5  PROT 5.8* 5.8*  ALBUMIN 1.7* 1.8*   No results for input(s): LIPASE, AMYLASE in the last 168 hours. No results for input(s): AMMONIA in the last 168 hours. Coagulation Profile: No results for input(s): INR, PROTIME in the last 168 hours. Cardiac Enzymes: No results for input(s): CKTOTAL, CKMB, CKMBINDEX, TROPONINI in the last 168 hours. BNP (last 3 results) No results for input(s): PROBNP in the last 8760 hours. HbA1C: No results for input(s): HGBA1C in the last 72 hours. CBG:  Recent Labs Lab 12/04/16 0607 12/04/16 1346 12/04/16 2240 12/05/16 0549 12/05/16 0758  GLUCAP 130* 124* 119* 125* 131*   Lipid Profile:  Recent Labs  12/04/16 0441  TRIG 97   Thyroid Function Tests: No results for input(s): TSH, T4TOTAL,  FREET4, T3FREE, THYROIDAB in the last 72 hours. Anemia Panel: No results for input(s): VITAMINB12, FOLATE, FERRITIN, TIBC, IRON, RETICCTPCT in the last 72 hours. Sepsis Labs: No results for input(s): PROCALCITON, LATICACIDVEN in the last 168 hours.  Recent Results (from the past 240 hour(s))  MRSA PCR Screening     Status: Abnormal   Collection Time: 11/28/16 12:40 PM  Result Value Ref Range Status   MRSA by PCR POSITIVE (A) NEGATIVE Final    Comment:        The GeneXpert MRSA Assay (FDA approved for NASAL specimens only), is one component of a comprehensive MRSA colonization surveillance program. It is not intended to diagnose MRSA infection nor to guide or monitor treatment for MRSA infections. RESULT CALLED TO, READ BACK BY AND VERIFIED WITH: F.AKAND RN AT [id number] ON 11/28/16 BY S.VANHOORNE          Radiology Studies: No results found.      Scheduled Meds: . acetaminophen  1,000 mg  Oral Q6H  . bisacodyl  10 mg Rectal Daily  . cloNIDine  0.2 mg Transdermal Weekly  . enoxaparin (LOVENOX) injection  40 mg Subcutaneous Q24H  . feeding supplement (ENSURE ENLIVE)  237 mL Oral BID BM  . feeding supplement (PRO-STAT SUGAR FREE 64)  30 mL Oral BID  . furosemide  20 mg Oral BID  . hydrALAZINE  50 mg Oral Q6H  . insulin aspart  0-9 Units Subcutaneous Q8H  . lip balm  1 application Topical BID  . losartan  25 mg Oral Daily  . metoprolol tartrate  50 mg Oral BID  . mirtazapine  7.5 mg Oral QHS  . pantoprazole (PROTONIX) IV  40 mg Intravenous Q12H   Continuous Infusions: . Marland KitchenTPN (CLINIMIX-E) Adult 83 mL/hr (12/04/16 1807)     LOS: 59 days    Cordelia Poche [facility] 12/05/2016, 11:30 AM Pager: [phone]  If 7PM-7AM, please contact night-coverage [email or web address] Password [email or web address]

## 2016-12-05 NOTE — Progress Notes (Signed)
[place] NOTE   Pharmacy Consult for TPN Indication: prolonged ileus  Patient Measurements: Height: 5' 2"  (157.5 cm) Weight: 192 lb 7.4 oz (87.3 kg) IBW/kg (Calculated) : 50.1 TPN AdjBW (KG): 60.9 Body mass index is 35.2 kg/m.  Insulin Requirements: 2 Unit SSI yesterday  Current Nutrition: Full liquids, Ensure daily and ProStat BID  IVF: none  Central access: PICC TPN start date: 11/08/16  ASSESSMENT                                                                   HPI:  81 y.o F with hx of gallstones presented to the ED on 1/20 with c/o abdominal pain. MRCP on 1/22 showed distended gallbladder with tiny gallstones and wall thickening with suspicion for acute cholecystitis. Patient underwent lap cholecystectomy with intraoperative cholangiogram on 1/22.  To start TPN for prolonged ileus and poor oral intake.  Significant events:  1/22: lap chole with cholangiogram 1/30: abd KUB with suspicion for post-op ileus; NG tube placed  2/2: high NG output from 1/31-2/1 (1365m) but decreased 2/1-2/2 (300 ml). RN reported foul smell from NG tube --CCS may d/c NG 2/3: trial clamping NG - successful 2/4 NGT removed. Switching from E 5/20 to E 5/15 to better align with nutritional goals 2/6 leave NGT out, continue NPO, GI consult 2/8 Xray consistent with ileus or SBO - possible ex lap tomorrow 2/9: Underwent exploratory laparotomy, lysis of adhesions.  2/16: back to OR for recurrent SBO.  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 2/21: starting clear liquids; fluid-overloaded per CCS, starting lasix and stopping IVF. Nutrition requesting increasing goal rate to give full 2L daily (83 ml/hr) as patient with several surgeries since last assessment 2/23-4: taking Pro-stat bid, total 30gm protein/day. Added Ensure 2/24 - refused all 2/26: Ensure and ProStat recently documented but per RN negligible intake. RN also notes some bloody  emesis this  2/27: started on Remeron by palliative MD. Taking partial supplements, but appetite is poor.  Today 12/05/16:   Glucose: no Hx DM, CBGs all controlled  Electrolytes: K remains low (substantial repletion yesterday, but most given before Mg corrected); Mg improved after 4g yesterday but decreasing. Na, Cl, CorrCa wnl. Note Pt was on Lasix and HCTZ; now HCTZ stopped  Renal: SCr/BUN ok; I/O likely not accurate at ~30L+ since admit, but does reflect almost 20 kg weight gain and edematous extremeties  LFTs:  Alk phos still minimally elevated but improving; AST/ALT improved to wnl. T bili now wnl, albumin remains low  TGs: stable wnl  Prealbumin: Low but improved  NUTRITIONAL GOALS                                                                                             RD recs (2/21) Kcal:  1600-1800kcal/day  Protein:  105-120g/day  Fluid:  >1.6L/day   Clinimix E 5/15 (  switched from E 5/20 at 70 ml/hr due to availability) at a goal rate of 75 ml/hr + 20% fat emulsion at 20 ml/hr x 12 hr to provide:  90 g/day protein, 1758 Kcal/day.  New regimen per RD suggestions 2/21: Clinimix E 5/15 at 83 ml/hr + 20 Lipids at 20 ml/hr x 12 hr will provide 100gm protein, ~1900 kCal per day  (+++There is currently a Psychologist, prison and probation services of Clinimix solution. Pharmacy will use Clinimix product based on availability+++)  PLAN    Mg 1g IV x 1 for maintenance  KCl 10 meq IV x 5; hopefully better response with Mg wnl - will use concentrated KCl to reduce fluid intake.  At 1800 today:  Reduce Clinimix E 5/15 to 60 ml/hr to help improve appetite and minimize fluid gain - provides 72 g protein and 1502 kcal daily. Should be close to nutrition goals considering partial intake of Ensure/ProStat.  20% fat emulsion at 20 ml/hr over 12 hrs  Trace elements are on national shortage, will be added every other day (last added 2/26).  Multivitamins will be added daily.  Continue sensitive SSI q8  hr.   TPN lab panels on Mondays & Thursdays  BMP/Mg tomorrow AM  Follow up labs  Reuel Boom, PharmD, BCPS Pager: [phone] 12/05/2016, 11:16 AM

## 2016-12-05 NOTE — Progress Notes (Signed)
Patient ID: Melanie [name], female   DOB: Oct 30, 1935, 81 y.o.   MRN: [id number]   11 Days Post-Op   Subjective: Seems in much better spirits today. Up out of bed and chair. No specific complaints.  Objective: Vital signs in last 24 hours: Temp:  [98 F (36.7 C)-98.4 F (36.9 C)] 98.2 F (36.8 C) (02/27 0929) Pulse Rate:  [70-80] 78 (02/27 0929) Resp:  [18] 18 (02/27 0929) BP: (133-176)/(49-81) 162/58 (02/27 0929) SpO2:  [95 %-100 %] 95 % (02/27 0929) Weight:  [87.3 kg (192 lb 7.4 oz)] 87.3 kg (192 lb 7.4 oz) (02/27 0547) Last BM Date: 12/01/16  Intake/Output from previous day: 02/26 0701 - 02/27 0700 In: 2074 [I.V.:1974; IV Piggyback:100] Out: 300 [Urine:300] Intake/Output this shift: No intake/output data recorded.  General appearance: alert, cooperative and no distress GI: Nondistended. Mild appropriate incisional tenderness. Incision/Wound: Clean and dry  Lab Results:   Recent Labs  12/04/16 0441  WBC 11.2*  HGB 7.7*  HCT 23.2*  PLT 375   BMET  Recent Labs  12/04/16 0441 12/04/16 2009 12/05/16 0401  NA 138  --  141  K 2.7* 3.0* 3.1*  CL 101  --  103  CO2 30  --  30  GLUCOSE 132*  --  124*  BUN 17  --  20  CREATININE 0.45  --  0.45  CALCIUM 8.2*  --  8.3*     Studies/Results: No results found.  Anti-infectives: Anti-infectives    Start     Dose/Rate Route Frequency Ordered Stop   11/23/16 1800  piperacillin-tazobactam (ZOSYN) IVPB 3.375 g  Status:  Discontinued     3.375 g 12.5 mL/hr over 240 Minutes Intravenous Every 8 hours 11/23/16 1753 11/26/16 0839   11/17/16 0730  cefoTEtan (CEFOTAN) 2 g in dextrose 5 % 50 mL IVPB     2 g 100 mL/hr over 30 Minutes Intravenous On call to O.R. 11/17/16 0717 11/17/16 1040   11/04/16 0900  piperacillin-tazobactam (ZOSYN) IVPB 3.375 g  Status:  Discontinued     3.375 g 12.5 mL/hr over 240 Minutes Intravenous Every 8 hours 11/04/16 0833 11/17/16 0631   10/30/16 1600  piperacillin-tazobactam (ZOSYN) IVPB 3.375 g   Status:  Discontinued     3.375 g 12.5 mL/hr over 240 Minutes Intravenous Every 8 hours 10/30/16 1539 11/01/16 1814   10/29/16 1200  piperacillin-tazobactam (ZOSYN) IVPB 3.375 g  Status:  Discontinued     3.375 g 12.5 mL/hr over 240 Minutes Intravenous Every 8 hours 10/29/16 0941 10/30/16 1539      Assessment/Plan: Admitted 1/20 - Medicine S/p Laparoscopic Cholecystectomy with intraoperative cholangiogram, 10/30/16, Dr. Rodman Key [name];Necrotic gallbladder with free intrapertioneal bile; enlarged CBD with free flow into the duodenum POD 35 Exploratory laparotomy with LOA - findings: Mechanical obstruction, 11/17/16, Dr. Dalbert [name] POD 17 Exploratory laparotomy, LOA, reduction of SB volvulus 2/16- Dr. Zella [name], POD 10  Swelling right arm - Doppler is negative for DVT Hypokalemia - being replaced  Chest pain with elevated troponin -improved.  Echo EF 0000000 1 diastolic dysfunction/Trivial MR- continue prn nitro Anemia Stable post transfusion. Hgb - 8.6 - 11/30/2016 Severe protein calorie malnutrition -prealbumin 6.0 - 11/27/2016 on TNA.   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 - on chronic antibiotics History MRSA - On isolation FEN: IV fluids, full liquids. Cont FL for now ID: Zosyn 1/21- 11/16/16, on again 2/15-2/17.  No abscess at time of surgery. Currently no antibitotics. WBC improved DVT prophylaxis: Lovenox  Plan:   there is slow steady improvement  She needs to be mobilized and try eating more.   continue physical therapy, continued TNA for now. Discussed with daughter in the room.     LOS: 38 days    Melanie [name] T 12/05/2016

## 2016-12-05 NOTE — Progress Notes (Addendum)
Physical Therapy Treatment Patient Details Name: Melanie [name] MRN: [id number] DOB: [birth year] Today's Date: 12/05/2016    History of Present Illness 81 y.o. female with medical history significant of gallstones presents to the hospital with the chief complaint of abdominal pain. s/p laparoscopic cholecystectomy 10/30/16. Post op ileus, Exploratory laparotomy with LOA - findings:  Mechanical obstruction, 11/17/16, Dr. Dalbert Batman; Patient had third surgical intervention, emergent laparatomy d/t recurrent bowel obstruction with volvulus with ischemic segment, 2/16    PT Comments    Progressing slowly with mobility. Min encouragement for participation/progression of activity. Continue to recommend SNF.  Would recommend OOB daily with nursing assistance.   Follow Up Recommendations  SNF     Equipment Recommendations  None recommended by PT    Recommendations for Other Services       Precautions / Restrictions Precautions Precautions: Fall Restrictions Weight Bearing Restrictions: No    Mobility  Bed Mobility Overal bed mobility: Needs Assistance Bed Mobility: Supine to Sit     Supine to sit: Min assist;HOB elevated     General bed mobility comments: Assist for trunk.  Increased time. Pt relied on bedrail.   Transfers Overall transfer level: Needs assistance Equipment used: Rolling walker (2 wheeled) Transfers: Sit to/from Stand Sit to Stand: Min assist Stand pivot transfers: Min assist       General transfer comment: Assist to rise, stabilize, control descent. Cues for hand placement. Increased time. Stand pivot, bed >bsc, with RW. Total assist for hygiene.   Ambulation/Gait Ambulation/Gait assistance: Min assist;+2 safety/equipment Ambulation Distance (Feet): 65 Feet Assistive device: Rolling walker (2 wheeled) Gait Pattern/deviations: Step-through pattern;Decreased stride length;Trunk flexed     General Gait Details: slow gait speed. followed with recliner for safety.  Vcs safety, posture.    Stairs            Wheelchair Mobility    Modified Rankin (Stroke Patients Only)       Balance                                    Cognition Arousal/Alertness: Awake/alert Behavior During Therapy: WFL for tasks assessed/performed Overall Cognitive Status: Within Functional Limits for tasks assessed                      Exercises      General Comments        Pertinent Vitals/Pain Pain Assessment: Faces Faces Pain Scale: Hurts little more Pain Location: abd Pain Descriptors / Indicators: Sore;Grimacing Pain Intervention(s): Monitored during session;Repositioned    Home Living                      Prior Function            PT Goals (current goals can now be found in the care plan section) Progress towards PT goals: Progressing toward goals    Frequency    Min 3X/week      PT Plan Current plan remains appropriate    Co-evaluation             End of Session Equipment Utilized During Treatment: Gait belt Activity Tolerance: Patient limited by fatigue Patient left: in chair;with call bell/phone within reach;with family/visitor present;with chair alarm set   PT Visit Diagnosis: Muscle weakness (generalized) (M62.81);Difficulty in walking, not elsewhere classified (R26.2)     Time: [id number] PT Time Calculation (min) (ACUTE ONLY): 25 min  Charges:  $  Gait Training: 8-22 mins $Therapeutic Activity: 8-22 mins                    G Codes:       Weston Anna, MPT Pager: [phone]

## 2016-12-05 NOTE — Progress Notes (Signed)
Nutrition Follow-up  DOCUMENTATION CODES:   Severe malnutrition in context of acute illness/injury  INTERVENTION:  - Continue TPN regimen per Pharmacy. - Will d/c Ensure Enlive per pt preference. - Continue 30 mL Prostat BID and continue encouragement to use this supplement. Each packet provides 15 grams of protein. - RD will continue to monitor for additional nutrition-related needs.  NUTRITION DIAGNOSIS:   Malnutrition related to acute illness as evidenced by energy intake < or equal to 50% for > or equal to 5 days, mild depletion of body fat, severe depletion of muscle mass. -ongoing  GOAL:   Patient will meet greater than or equal to 90% of their needs -met at this time.   MONITOR:   PO intake, Supplement acceptance, Weight trends, Labs, Skin, I & O's, Other (Comment) (TPN regimen)  ASSESSMENT:   81 y.o. female with medical history significant of gallstones presents to the hospital with the chief complaint of abdominal pain. s/p laparoscopic cholecystectomy 10/30/16. Post op ileus, Exploratory laparotomy with LOA - findings:  Mechanical obstruction, 11/17/16, Dr. Dalbert Batman; Patient had third surgical intervention, emergent laparatomy d/t recurrent bowel obstruction with volvulus with ischemic segment, 2/16  Significant events: 1/22: lap chole with cholangiogram 1/30: abd KUB with suspicion for post-op ileus; NG tube placed 2/2: high NG output from 1/31-2/1 (1384m) but decreased 2/1-2/2 (300 ml). RN reported foul smell from NG tube --CCS may d/c NG 2/3: trial clamping NG - successful 2/4 NGT removed. Switching from E 5/20 to E 5/15 to better align with nutritional goals 2/6 leave NGT out, continue NPO, GI consult 2/8 Xray consistent with ileus or SBO - possible ex lap tomorrow 2/9: Underwent exploratorylaparotomy, lysis of adhesions.  2/12 Transferred to ICU for chest pain, elevated troponin. 2/16-Acute abdomen with sepsis-Emergency exploratory laparotomy, lysis of adhesions,  reduction of volvulus. 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 No intakes documented since previous assessment. Dr. ARowe Pavymet with pt around lunch time yesterday and her note indicates pt had consumed ~15% of the meal. Pt reports ongoing abdominal pain which is dull but slightly worsened by PO intakes; denies worsening with physical activity. Pt denies nausea. She reports ongoing poor appetite but is excited about diet advancement. Reviewed foods that are and are not provided on Soft diet with pt and her daughter, who is at bedside.   Talked with pt about oral nutrition supplements. She had tried Ensure when she was in a rehab facility in the past and reported nausea and loose stools at that time and that these same experiences have happened with trying Ensure this admission. She has not yet tried Prostat. Talked with pt at length about this supplement and the rationale for it and provided ongoing encouragement for her to use it; pt reports she is willing to try it later today.   Per review weight +29 lbs from 11/08/16. Pt continues with Clinimix E 5/15 @ 83 mL/hr with 20% ILE @ 20 mL/hr. Spoke with Pharmacist earlier today and plan is to decrease TPN rate to encourage return of appetite/increased PO intakes and to decrease volume load as plan is to not increase Lasix d/t concern for worsening hypokalemia. Per order, today  at 1800 TPN regimen will be Clinimix E 5/15 @ 60 mL/hr with 20% ILE @ 20 mL/hr over 12 hours. This regimen will provide 72 grams of protein (69% minimum estimated protein need) and 1502 kcal (94% minimum estimated kcal need).   Medications reviewed; 10 mg Dulcolax/day, 20 mg  oral Lasix BID, sliding scale Novolog, 1 g IV Mg sulfate x1 dose today, 4 mg IV Mg sulfate x1 dose yesterday, 40 mg IV Protonix BID, 10 mEq IV KCl x3 runs yesterday and x5 runs today. Labs reviewed; K: 3.1 mmol/L, Ca: 8.3 mg/dL, CBGs: 125 and 131 mg/dL today.    2/21 - Patient returned to surgery on 2/16 for emergency ex lap, LOA, and reduction of volvulus.  - Noted to have mesenteric volvulus with transient jejunal ischemia.  - Pt continues to have abdominal pain but reports that she is feeling better.  - Pt with moderate to severe edema in all four extremities; per MD note, there is concern for possible fluid overload and pt started on Lasix.  - Pt has gained 56lbs since admit; likely the majority of this weight is from fluid changes.  - Plan is to discontinue IVF and continue with TPN.  - RD adjusted pt's estimated needs today.  - Spoke to Pharmacist about increasing pt's protein in TPN since pt now s/p a third surgery, with severe edema, and now with a pressure injury. - RD spoke to patient and she is wiling to try the Prostat.  - K slightly low today; Pharmacy is supplementing as needed.  - Phos and Mg normal.     Diet Order:  .TPN (CLINIMIX-E) Adult TPN (CLINIMIX-E) Adult DIET SOFT Room service appropriate? Yes; Fluid consistency: Thin  Skin:  Wound (see comment) (Stage 2 wound to anus)  Last BM:  2/27  Height:   Ht Readings from Last 1 Encounters:  11/28/16 _0  (1.575 m)    Weight:   Wt Readings from Last 1 Encounters:  12/05/16 192 lb 7.4 oz (87.3 kg)    Ideal Body Weight:  52.3 kg  BMI:  Body mass index is 35.2 kg/m.  Estimated Nutritional Needs:   Kcal:  1600-1800kcal/day   Protein:  105-120g/day   Fluid:  >1.6L/day   EDUCATION NEEDS:   Education needs addressed    Jarome Matin, MS, RD, LDN, CNSC Inpatient Clinical Dietitian Pager # [phone] After hours/weekend pager # [phone]

## 2016-12-05 NOTE — Clinical Social Work Placement (Signed)
CSW will follow up with patient's preferable SNF's [facility]. CSW will follow up with patient with bed offers.  CLINICAL SOCIAL WORK PLACEMENT  NOTE  Date:  12/05/2016  Patient Details  Name: Melanie [name] MRN: [id number] Date of Birth: Feb 07, 1936  Clinical Social Work is seeking post-discharge placement for this patient at the [facility] level of care (*CSW will initial, date and re-position this form in  chart as items are completed):  Yes   Patient/family provided with [facility] Work Department's list of facilities offering this level of care within the geographic area requested by the patient (or if unable, by the patient's family).  Yes   Patient/family informed of their freedom to choose among providers that offer the needed level of care, that participate in Medicare, Medicaid or managed care program needed by the patient, have an available bed and are willing to accept the patient.  Yes   Patient/family informed of [facility]'s ownership interest in [facility] and [facility], as well as of the fact that they are under no obligation to receive care at these facilities.  PASRR submitted to EDS on       PASRR number received on       Existing PASRR number confirmed on 12/03/16     FL2 transmitted to all facilities in geographic area requested by pt/family on 12/05/16     FL2 transmitted to all facilities within larger geographic area on 12/05/16     Patient informed that his/her managed care company has contracts with or will negotiate with certain facilities, including the following:            Patient/family informed of bed offers received.  Patient chooses bed at       Physician recommends and patient chooses bed at      Patient to be transferred to   on  .  Patient to be transferred to facility by       Patient family notified on   of transfer.  Name of family member notified:        PHYSICIAN       Additional  Comment:    _______________________________________________ Burnis Medin, LCSW 12/05/2016, 1:45 PM

## 2016-12-05 NOTE — Progress Notes (Signed)
Previous RN reported bright bloody stool during day shift. This is new. Pt. Had one dark bloody stool before bed time. On call NP made aware. Will continue to monitor pt. Closely. CBC is scheduled for the AM. Will keep an eye on Hgb level.

## 2016-12-06 ENCOUNTER — Inpatient Hospital Stay (HOSPITAL_COMMUNITY): Payer: Medicare Other

## 2016-12-06 DIAGNOSIS — I160 Hypertensive urgency: Secondary | ICD-10-CM

## 2016-12-06 LAB — MAGNESIUM: Magnesium: 2 mg/dL (ref 1.7–2.4)

## 2016-12-06 LAB — GLUCOSE, CAPILLARY
GLUCOSE-CAPILLARY: 111 mg/dL — AB (ref 65–99)
GLUCOSE-CAPILLARY: 101 mg/dL — AB (ref 65–99)
GLUCOSE-CAPILLARY: 112 mg/dL — AB (ref 65–99)

## 2016-12-06 LAB — CBC
HCT: 22.6 % — ABNORMAL LOW (ref 36.0–46.0)
Hemoglobin: 7.3 g/dL — ABNORMAL LOW (ref 12.0–15.0)
MCH: 26.4 pg (ref 26.0–34.0)
MCHC: 32.3 g/dL (ref 30.0–36.0)
MCV: 81.6 fL (ref 78.0–100.0)
PLATELETS: 365 10*3/uL (ref 150–400)
RBC: 2.77 MIL/uL — AB (ref 3.87–5.11)
RDW: 17.8 % — ABNORMAL HIGH (ref 11.5–15.5)
WBC: 9.6 10*3/uL (ref 4.0–10.5)

## 2016-12-06 LAB — BASIC METABOLIC PANEL
Anion gap: 7 (ref 5–15)
BUN: 18 mg/dL (ref 6–20)
CALCIUM: 8.2 mg/dL — AB (ref 8.9–10.3)
CHLORIDE: 105 mmol/L (ref 101–111)
CO2: 30 mmol/L (ref 22–32)
CREATININE: 0.43 mg/dL — AB (ref 0.44–1.00)
GFR calc non Af Amer: >60 mL/min (ref >60)
Glucose, Bld: 108 mg/dL — ABNORMAL HIGH (ref 65–99)
Potassium: 3 mmol/L — ABNORMAL LOW (ref 3.5–5.1)
SODIUM: 142 mmol/L (ref 135–145)

## 2016-12-06 MED ORDER — FUROSEMIDE 20 MG PO TABS
20.0000 mg | ORAL_TABLET | Freq: Every day | ORAL | Status: DC
  Administered 2016-12-07: 20 mg via ORAL
  Filled 2016-12-06: qty 1

## 2016-12-06 MED ORDER — MAGNESIUM SULFATE IN D5W 1-5 GM/100ML-% IV SOLN
1.0000 g | Freq: Once | INTRAVENOUS | Status: AC
  Administered 2016-12-06: 1 g via INTRAVENOUS
  Filled 2016-12-06: qty 100

## 2016-12-06 MED ORDER — LOSARTAN POTASSIUM 50 MG PO TABS
50.0000 mg | ORAL_TABLET | Freq: Every day | ORAL | Status: DC
  Administered 2016-12-07 – 2016-12-09 (×3): 50 mg via ORAL
  Filled 2016-12-06 (×3): qty 1

## 2016-12-06 MED ORDER — FUROSEMIDE 20 MG PO TABS: 20.0000 mg | ORAL_TABLET | Freq: Two times a day (BID) | ORAL | Status: DC

## 2016-12-06 MED ORDER — TRACE MINERALS CR-CU-MN-SE-ZN 10-1000-500-60 MCG/ML IV SOLN
INTRAVENOUS | Status: AC
  Administered 2016-12-06: 17:00:00 via INTRAVENOUS
  Filled 2016-12-06: qty 960

## 2016-12-06 MED ORDER — POTASSIUM CHLORIDE CRYS ER 20 MEQ PO TBCR
20.0000 meq | EXTENDED_RELEASE_TABLET | Freq: Four times a day (QID) | ORAL | Status: AC
  Administered 2016-12-06 (×2): 20 meq via ORAL
  Filled 2016-12-06 (×2): qty 1

## 2016-12-06 MED ORDER — HYDROMORPHONE HCL 1 MG/ML IJ SOLN
0.5000 mg | INTRAMUSCULAR | Status: DC | PRN
  Administered 2016-12-06 – 2016-12-09 (×2): 1 mg via INTRAVENOUS
  Filled 2016-12-06 (×3): qty 1

## 2016-12-06 MED ORDER — FAT EMULSION 20 % IV EMUL
240.0000 mL | INTRAVENOUS | Status: AC
  Administered 2016-12-06: 240 mL via INTRAVENOUS
  Filled 2016-12-06: qty 250

## 2016-12-06 MED ORDER — POTASSIUM CHLORIDE 10 MEQ/50ML IV SOLN
10.0000 meq | INTRAVENOUS | Status: AC
  Administered 2016-12-06 (×5): 10 meq via INTRAVENOUS
  Filled 2016-12-06 (×5): qty 50

## 2016-12-06 NOTE — Care Management Note (Signed)
Case Management Note  Patient Details  Name: TILISA DETORRES MRN: [id number] Date of Birth: 1935/12/05  Subjective/Objective:   Sx following-s/p exp lap chole,LOA,reduction of SB. TNA-weaning-soft diet. PT-[facility] SNF-CSW following.                 Action/Plan:d/c SNF.   Expected Discharge Date:                  Expected Discharge Plan:  Skilled Nursing Facility  In-House Referral:  Clinical Social Work  Discharge planning Services  CM Consult  Post Acute Care Choice:    Choice offered to:     DME Arranged:    DME Agency:     HH Arranged:    [place] Agency:     Status of Service:  In process, will continue to follow  If discussed at Long Length of Stay Meetings, dates discussed:    Additional Comments:  Dessa Phi, RN 12/06/2016, 12:30 PM

## 2016-12-06 NOTE — Progress Notes (Signed)
  Physical Therapy Treatment Patient Details Name: Melanie [name] MRN: [id number] DOB: [birth year] Today's Date: 12/06/2016    History of Present Illness 81 y.o. female with medical history significant of gallstones presents to the hospital with the chief complaint of abdominal pain. s/p laparoscopic cholecystectomy 10/30/16. Post op ileus, Exploratory laparotomy with LOA - findings:  Mechanical obstruction, 11/17/16, Dr. Dalbert Batman; Patient had third surgical intervention, emergent laparatomy d/t recurrent bowel obstruction with volvulus with ischemic segment, 2/16    PT Comments    Very limited session due to max c/o fatigue  Follow Up Recommendations  SNF     Equipment Recommendations  None recommended by PT    Recommendations for Other Services       Precautions / Restrictions Precautions Precautions: Fall Restrictions Weight Bearing Restrictions: No    Mobility  Bed Mobility               General bed mobility comments: OOB in recliner  Transfers Overall transfer level: Needs assistance Equipment used: Rolling walker (2 wheeled) Transfers: Sit to/from Stand Sit to Stand: Mod assist;+2 physical assistance;+2 safety/equipment Stand pivot transfers: Mod assist;+2 physical assistance;+2 safety/equipment       General transfer comment: attempted x 2   Ambulation/Gait Ambulation/Gait assistance: Min assist;+2 safety/equipment Ambulation Distance (Feet): 6 Feet Assistive device: Rolling walker (2 wheeled) Gait Pattern/deviations: Step-through pattern;Decreased stride length;Trunk flexed Gait velocity: decr   General Gait Details: slow gait speed. followed with recliner for safety. Vcs safety, posture.    Stairs            Wheelchair Mobility    Modified Rankin (Stroke Patients Only)       Balance                                    Cognition   Behavior During Therapy: Flat affect                 Problem Solving: Slow  processing;Requires verbal cues      Exercises      General Comments        Pertinent Vitals/Pain Pain Score: 0-No pain    Home Living                      Prior Function            PT Goals (current goals can now be found in the care plan section) Progress towards PT goals: Progressing toward goals    Frequency    Min 3X/week      PT Plan Current plan remains appropriate    Co-evaluation             End of Session Equipment Utilized During Treatment: Gait belt Activity Tolerance: Patient limited by fatigue Patient left: in chair;with call bell/phone within reach;with family/visitor present;with chair alarm set Nurse Communication: Mobility status PT Visit Diagnosis: Muscle weakness (generalized) (M62.81);Difficulty in walking, not elsewhere classified (R26.2)     Time: 1208-1227 PT Time Calculation (min) (ACUTE ONLY): 19 min  Charges:  $Gait Training: 8-22 mins                    G Codes:       Rica Koyanagi  PTA [facility]  Acute  Rehab Pager      [phone]

## 2016-12-06 NOTE — Progress Notes (Signed)
Pt. Reported to this RN that she started to have pain in abdomen, upper mid. Pt. Stated it felt more like bloating not so much pain. PO pain medicine given, then maalox/mylanta given when pain not resolved, and IV dilaudid later given when bloating feeling continued. Pain was able to go down to a level 3 after IV pain medicine. No more bloody BM's throughout the night after midnight. Will continue to monitor closely.

## 2016-12-06 NOTE — Progress Notes (Signed)
Melanie [name] CONSULT NOTE   Pharmacy Consult for TPN Indication: prolonged ileus  Patient Measurements: Height: 5' 2" (157.5 cm) Weight: 192 lb 0.3 oz (87.1 kg) IBW/kg (Calculated) : 50.1 TPN AdjBW (KG): 60.9 Body mass index is 35.12 kg/m.  Insulin Requirements: 2 Unit SSI yesterday  Current Nutrition: Soft diet ordered, Prostat BID  IVF: none  Central access: PICC TPN start date: 11/08/16  ASSESSMENT                                                                   HPI:  81 y.o F with hx of gallstones presented to the ED on 1/20 with c/o abdominal pain. MRCP on 1/22 showed distended gallbladder with tiny gallstones and wall thickening with suspicion for acute cholecystitis. Patient underwent lap cholecystectomy with intraoperative cholangiogram on 1/22.  To start TPN for prolonged ileus and poor oral intake.  Significant events:  1/22: lap chole with cholangiogram 1/30: abd KUB with suspicion for post-op ileus; NG tube placed  2/2: high NG output from 1/31-2/1 (1378m) but decreased 2/1-2/2 (300 ml). RN reported foul smell from NG tube --CCS may d/c NG 2/3: trial clamping NG - successful 2/4 NGT removed. Switching from E 5/20 to E 5/15 to better align with nutritional goals 2/6 leave NGT out, continue NPO, GI consult 2/8 Xray consistent with ileus or SBO - possible ex lap tomorrow 2/9: Underwent exploratory laparotomy, lysis of adhesions.  2/16: back to OR for recurrent SBO.  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 2/21: starting clear liquids; fluid-overloaded per CCS, starting lasix and stopping IVF. Nutrition requesting increasing goal rate to give full 2L daily (83 ml/hr) as patient with several surgeries since last assessment 2/23-4: taking Pro-stat bid, total 30gm protein/day. Added Ensure 2/24 - refused all 2/26: Ensure and ProStat recently documented but per RN negligible intake. RN also notes some bloody emesis this   2/27: started on Remeron by palliative MD. Taking partial supplements, but appetite is poor. Soft diet started. New diarrhea w/ mucus and ?blood in stool - surgery to evaluate 28: tolerating soft diet well. SBO resolved on imaging. Diarrhea/abd pain continues  Today 12/06/16:   Glucose: no Hx DM, CBGs all controlled (goal 100-150 mg/dL)  Electrolytes: K remains low despite repletion and normal Mg yesterday. Na, Cl, CorrCa wnl. Note Pt was on Lasix and HCTZ; now HCTZ stopped  Renal: SCr/BUN ok; continues to diurese from fluid overload but limited by hypokalemia  LFTs:  2/26 - Alk phos still minimally elevated but improving; AST/ALT improved to wnl. T bili now wnl, albumin remains low  TGs: stable wnl 2/26  Prealbumin: Low but improved 2/26  NUTRITIONAL GOALS                                                                                             RD recs (2/21) Kcal:  1600-1800kcal/day  Protein:  105-120g/day  Fluid:  >1.6L/day   Clinimix E 5/15 (switched from E 5/20 at 70 ml/hr due to availability) at a goal rate of 75 ml/hr + 20% fat emulsion at 20 ml/hr x 12 hr to provide:  90 g/day protein, 1758 Kcal/day.  New regimen per RD suggestions 2/21: Clinimix E 5/15 at 83 ml/hr + 20 Lipids at 20 ml/hr x 12 hr will provide 100gm protein, ~1900 kCal per day  (+++There is currently a Psychologist, prison and probation services of Clinimix solution. Pharmacy will use Clinimix product based on availability+++)  PLAN    Repeat Mg 1g IV x 1 for maintenance  KCl 10 meq/50 ml IV x 5 ordered per MD. Will give additional 40 mEq PO as patient seems to be tolerating oral meds well.   At 1800 today:  Reduce Clinimix E 5/15 to 40 ml/hr per surgery instructions to wean - provides 48 g protein and 1162 kcal daily.   20% fat emulsion at 20 ml/hr over 12 hrs  Trace elements are on national shortage, will be added every other day (last added 2/28).  Multivitamins will be added daily.  Continue sensitive SSI q8 hr.    TPN lab panels on Mondays & Thursdays  Follow up labs  Reuel Boom, PharmD, BCPS Pager: [phone] 12/06/2016, 11:53 AM

## 2016-12-06 NOTE — Progress Notes (Addendum)
*  PRELIMINARY RESULTS* Vascular Ultrasound Renal Artery Duplex has been completed.   Study was technically difficult due to overlying bowel gas. Findings suggest >60% right renal artery stenosis, and 1-59% left renal artery stenosis.  Preliminary results discussed with Dr. Maylene Roes.  12/06/2016 9:37 AM Maudry Mayhew, BS, RVT, RDCS, RDMS

## 2016-12-06 NOTE — Progress Notes (Signed)
12 Days Post-Op  Subjective: She complained of some abdominal pain last evening. She's having loose stools. She cannot tell if this is any different than prior stools. She ate fairly well at supper last evening but has not eaten since because of her abdominal discomfort last evening.  She is on daily dulcolax suppositories. We will discontinue these. She also complains of continence and trouble getting up to void.   Objective: Vital signs in last 24 hours: Temp:  [98.6 F (37 C)-98.7 F (37.1 C)] 98.7 F (37.1 C) (02/28 0527) Pulse Rate:  [76-90] 76 (02/28 0527) Resp:  [20-21] 21 (02/28 0527) BP: (158-176)/(54-60) 176/59 (02/28 0527) SpO2:  [96 %-97 %] 96 % (02/28 0527) Weight:  [87.1 kg (192 lb 0.3 oz)] 87.1 kg (192 lb 0.3 oz) (02/28 0527) Last BM Date: 12/05/16 120 PO recorded 2200 IV Urine x 5 BM x 6 Afebrile, VSS K+ 3.0 - pharmacy replacing   Mag 2.0 Plain abdominal film:  There are no dilated loops of large or small bowel. No visible free air or free fluid on these supine radiographs. Surgical staples are seen in the midline. No acute bone abnormality. NG tube has been removed. Vascular renal ultrasound ordered by Dr. Teryl Lucy pending but reported Positive Negative C diff 11/04/16 Ongoing anemia H/H 7.3/22.6 Intake/Output from previous day: 02/27 0701 - 02/28 0700 In: 2395.2 [P.O.:120; I.V.:1925.2; IV Piggyback:350] Out: -  Intake/Output this shift: No intake/output data recorded.  General appearance: alert, cooperative and no distress Resp: clear to auscultation bilaterally GI: soft, non-tender; bowel sounds normal; no masses,  no organomegaly and Staples in place  Lab Results:   Recent Labs  12/04/16 0441 12/06/16 0036  WBC 11.2* 9.6  HGB 7.7* 7.3*  HCT 23.2* 22.6*  PLT 375 365    BMET  Recent Labs  12/05/16 0401 12/06/16 0500  NA 141 142  K 3.1* 3.0*  CL 103 105  CO2 30 30  GLUCOSE 124* 108*  BUN 20 18  CREATININE 0.45 0.43*  CALCIUM 8.3* 8.2*    PT/INR No results for input(s): LABPROT, INR in the last 72 hours.   Recent Labs Lab 11/30/16 0413 12/04/16 0441  AST 27 23  ALT 47 29  ALKPHOS 227* 179*  BILITOT 0.6 0.5  PROT 5.8* 5.8*  ALBUMIN 1.7* 1.8*     Lipase     Component Value Date/Time   LIPASE 13 11/11/2016 0945     Studies/Results: Dg Abd 1 View  Result Date: 12/06/2016 CLINICAL DATA:  Mid abdominal pain. Recent recurrent small bowel obstruction, with surgery on 11/24/2016. EXAM: ABDOMEN - 1 VIEW COMPARISON:  11/27/2016 FINDINGS: There are no dilated loops of large or small bowel. No visible free air or free fluid on these supine radiographs. Surgical staples are seen in the midline. No acute bone abnormality. NG tube has been removed. IMPRESSION: No residual dilated small bowel loops.  Benign-appearing abdomen. Electronically Signed   By: Lorriane Shire M.D.   On: 12/06/2016 11:02    Medications: . acetaminophen  1,000 mg Oral Q6H  . bisacodyl  10 mg Rectal Daily  . cloNIDine  0.2 mg Transdermal Weekly  . enoxaparin (LOVENOX) injection  40 mg Subcutaneous Q24H  . feeding supplement (PRO-STAT SUGAR FREE 64)  30 mL Oral BID  . furosemide  20 mg Oral BID  . hydrALAZINE  50 mg Oral Q6H  . insulin aspart  0-9 Units Subcutaneous TID WC  . lip balm  1 application Topical BID  . losartan  25 mg Oral Daily  . metoprolol tartrate  50 mg Oral BID  . mirtazapine  7.5 mg Oral QHS  . pantoprazole (PROTONIX) IV  40 mg Intravenous Q12H  . potassium chloride  10 mEq Intravenous Q1 Hr x 5    Assessment/Plan Admitted 1/20 - Medicine S/p Laparoscopic Cholecystectomy with intraoperative cholangiogram, 10/30/16, Dr. Rodman Key Martin;Necrotic gallbladder with free intrapertioneal bile; enlarged CBD with free flow into the duodenum POD 35 Exploratory laparotomy with LOA - findings: Mechanical obstruction, 11/17/16, Dr. Dalbert Batman POD 17 Exploratory laparotomy, LOA, reduction of SB volvulus 2/16- Dr. Zella Richer, POD  10  Swelling right arm - Doppler is negative for DVT Hypokalemia- being replaced  Chest pain with elevated troponin -improved.  Echo EF 0000000 1 diastolic dysfunction/Trivial MR- continue prn nitro Anemia Stable post transfusion. Hgb - 8.6 - 11/30/2016 Severe protein calorie malnutrition -prealbumin 6.0 - 11/27/2016 on TNA.   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 - on chronic antibiotics History MRSA - On isolation FEN: IV fluids, TNA/starting soft diet ID: Zosyn 1/21- 11/16/16, on again 2/15-2/17.  No abscess at time of surgery. Currently no antibitotics. WBC improved DVT prophylaxis: Lovenox     Plan: Continue to replace potassium. Discontinue dulcolax supp, decrease  Lasix and make it 20 mg daily.  Await Doppler studies and  and medicines recommendations. Decrease TNA to 1/2.  Encourage family to help with walking her.    LOS: 39 days    Pachia Strum 12/06/2016 [phone]

## 2016-12-06 NOTE — Progress Notes (Addendum)
PROGRESS NOTE    Melanie [name]  [id number] DOB: 09/20/1936 DOA: 10/28/2016 PCP: Osborne Casco, MD     Brief Narrative:  Melanie [name] is a 81 y.o. female with prolonged hospitalization, admitted on 01/20 for necrotic cholecystitis. She developed post op bowel obstruction, re-intervened on 02/09 with exploratory laparotomy. NG tube in place, on TPN. TRH was consulted for chest pain that ruled out for ACS. Patient has been in persistent pain, on PCA pump. Patient had third surgical intervention, emergent laparatomy, recurrent bowel obstruction with volvulus with ischemic segment. Resumed clonidine for blood pressure control. Worsening hypertension, started on nicardipine drip.Clonidine dose increase to 0.2 mg, patient now off nicardipine.   Assessment & Plan:   Principal Problem:   Small bowel obstruction s/p ex lap & lysis of adhesions 11/17/2016 Active Problems:   Necrotic cholecystitis s/p lap cholecystectomy 10/30/2016   Essential hypertension   Hyperlipidemia   COPD (chronic obstructive pulmonary disease) (HCC)   GERD (gastroesophageal reflux disease)   Chest pain   H/O exploratory laparotomy   Anxiousness   Pressure injury of skin  Necrotic cholecystitis Post-op bowel obstruction as well as small bowel volvulus  -S/p lap chole 1/22, s/p ex lap with LOA 2/9, s/p ex lap with LOA and reduction of small bowel volvulus 2/16  -Management per primary team (surgery) -Currently tolerating TPN and soft diet   Atypical Chest Pain -Resolved  V-tach -Patient had asymptomatic episode of non-sustained v-tach. Continue telemetry   Hypertension secondary to unilateral RAS  -Renal artery duplex completed 2/28 with prelim result of right renal artery stenosis > 60%. Revascularization is indicated for short duration of elevated BP, failure of optimal medical therapy or intolerance to therapy, or recurrent flash pulmonary edema or refractory heart failure. Patient has had documented  uncontrolled HTN since as least early 2017. Continue to maximize medical therapy for now as below  -Unable to tolerate ACE due to cough. Losartan started at 25mg  yesterday, will increase to 50mg  starting tomorrow  -Continue hydralazine 50mg  q6h, metoprolol 50mg  BID, clonidine patch q weekly  -Could also add chlorthalidone for better control as well  -Renin/aldosterone level is pending  COPD -Stable  -Duoneb QID prn  Anemia -Stable -Transfuse for Hgb < 7  Hypokalemia -Replace   Chronic diastolic heart failure -Grade 2 diastolic heart failure with EF of 60-65% -Continue lasix daily    DVT prophylaxis: lovenox Code Status: Full Family Communication: daughter at bedside Disposition Plan: per surgical team   Procedures:   Laparoscopic cholecystectomy 10/30/16  Exploratory laparotomy with LOA 11/17/16  ECHO 2/12- EF 60-65%, G1DD.  Exploratory laparotomy 11/24/16  Antimicrobials:   Zosyn (1/20-1/24, 1/27-2/9, 2/15-2/18)   Subjective: Overnight, patient had a few episodes of bloody bowel movement. She continues to have mild abdominal pain as well. No other new complaints today. No fevers, chills, chest pain or shortness of breath. Continues to have some lower extremity edema. She states that she is tolerating soft foods and is wondering when she can be weaned off TPN.   Objective: Vitals:   12/05/16 0929 12/05/16 2152 12/05/16 2346 12/06/16 0527  BP: (!) 162/58 (!) 161/60 (!) 158/54 (!) 176/59  Pulse: 78 90  76  Resp: 18 20  (!) 21  Temp: 98.2 F (36.8 C) 98.6 F (37 C)  98.7 F (37.1 C)  TempSrc: Oral Oral  Oral  SpO2: 95% 97%  96%  Weight:    87.1 kg (192 lb 0.3 oz)  Height:  Intake/Output Summary (Last 24 hours) at 12/06/16 1219 Last data filed at 12/06/16 0910  Gross per 24 hour  Intake          2395.15 ml  Output                0 ml  Net          2395.15 ml   Filed Weights   12/04/16 0600 12/05/16 0547 12/06/16 0527  Weight: 90.3 kg (199 lb  1.2 oz) 87.3 kg (192 lb 7.4 oz) 87.1 kg (192 lb 0.3 oz)    Examination:  General exam: Appears calm and comfortable  Respiratory system: Clear to auscultation. Respiratory effort normal. Cardiovascular system: S1 & S2 heard, RRR. No JVD, murmurs, rubs, gallops or clicks. +1 pedal edema. Gastrointestinal system: Abdomen is nondistended, soft and mildly TTP, midline incision is clean and dry, staples in tact. No organomegaly or masses felt. Normal bowel sounds heard. Central nervous system: Alert and oriented. No focal neurological deficits. Extremities: Symmetric 5 x 5 power. Skin: No rashes, lesions or ulcers Psychiatry: Judgement and insight appear normal. Mood & affect appropriate.   Data Reviewed: I have personally reviewed following labs and imaging studies  CBC:  Recent Labs Lab 11/30/16 0413 12/02/16 0522 12/04/16 0441 12/06/16 0036  WBC 16.5* 13.1* 11.2* 9.6  NEUTROABS  --   --  8.2*  --   HGB 8.6* 8.0* 7.7* 7.3*  HCT 25.5* 24.1* 23.2* 22.6*  MCV 80.7 80.6 80.8 81.6  PLT 397 393 375 [id number]   Basic Metabolic Panel:  Recent Labs Lab 11/30/16 0413 12/03/16 1325 12/04/16 0441 12/04/16 2009 12/05/16 0401 12/06/16 0500  NA 137 136 138  --  141 142  K 3.5 2.4* 2.7* 3.0* 3.1* 3.0*  CL 109 100* 101  --  103 105  CO2 23 28 30   --  30 30  GLUCOSE 126* 121* 132*  --  124* 108*  BUN 18 17 17   --  20 18  CREATININE 0.40* 0.47 0.45  --  0.45 0.43*  CALCIUM 7.9* 8.2* 8.2*  --  8.3* 8.2*  MG 1.7  --  1.5* 2.3 2.1 2.0  PHOS 3.8  --  3.5  --   --   --    GFR: Estimated Creatinine Clearance: 59.4 mL/min (by C-G formula based on SCr of 0.43 mg/dL (L)). Liver Function Tests:  Recent Labs Lab 11/30/16 0413 12/04/16 0441  AST 27 23  ALT 47 29  ALKPHOS 227* 179*  BILITOT 0.6 0.5  PROT 5.8* 5.8*  ALBUMIN 1.7* 1.8*   No results for input(s): LIPASE, AMYLASE in the last 168 hours. No results for input(s): AMMONIA in the last 168 hours. Coagulation Profile: No results for  input(s): INR, PROTIME in the last 168 hours. Cardiac Enzymes: No results for input(s): CKTOTAL, CKMB, CKMBINDEX, TROPONINI in the last 168 hours. BNP (last 3 results) No results for input(s): PROBNP in the last 8760 hours. HbA1C: No results for input(s): HGBA1C in the last 72 hours. CBG:  Recent Labs Lab 12/04/16 2240 12/05/16 0549 12/05/16 0758 12/05/16 1639 12/06/16 0826  GLUCAP 119* 125* 131* 122* 111*   Lipid Profile:  Recent Labs  12/04/16 0441  TRIG 97   Thyroid Function Tests: No results for input(s): TSH, T4TOTAL, FREET4, T3FREE, THYROIDAB in the last 72 hours. Anemia Panel: No results for input(s): VITAMINB12, FOLATE, FERRITIN, TIBC, IRON, RETICCTPCT in the last 72 hours. Sepsis Labs: No results for input(s): PROCALCITON, LATICACIDVEN in the last 168  hours.  Recent Results (from the past 240 hour(s))  MRSA PCR Screening     Status: Abnormal   Collection Time: 11/28/16 12:40 PM  Result Value Ref Range Status   MRSA by PCR POSITIVE (A) NEGATIVE Final    Comment:        The GeneXpert MRSA Assay (FDA approved for NASAL specimens only), is one component of a comprehensive MRSA colonization surveillance program. It is not intended to diagnose MRSA infection nor to guide or monitor treatment for MRSA infections. RESULT CALLED TO, READ BACK BY AND VERIFIED WITH: F.AKAND RN AT [id number] ON 11/28/16 BY S.VANHOORNE        Radiology Studies: Dg Abd 1 View  Result Date: 12/06/2016 CLINICAL DATA:  Mid abdominal pain. Recent recurrent small bowel obstruction, with surgery on 11/24/2016. EXAM: ABDOMEN - 1 VIEW COMPARISON:  11/27/2016 FINDINGS: There are no dilated loops of large or small bowel. No visible free air or free fluid on these supine radiographs. Surgical staples are seen in the midline. No acute bone abnormality. NG tube has been removed. IMPRESSION: No residual dilated small bowel loops.  Benign-appearing abdomen. Electronically Signed   By: Lorriane Shire  M.D.   On: 12/06/2016 11:02      Scheduled Meds: . acetaminophen  1,000 mg Oral Q6H  . cloNIDine  0.2 mg Transdermal Weekly  . enoxaparin (LOVENOX) injection  40 mg Subcutaneous Q24H  . feeding supplement (PRO-STAT SUGAR FREE 64)  30 mL Oral BID  . furosemide  20 mg Oral Daily  . hydrALAZINE  50 mg Oral Q6H  . insulin aspart  0-9 Units Subcutaneous TID WC  . lip balm  1 application Topical BID  . [START ON 12/07/2016] losartan  50 mg Oral Daily  . magnesium sulfate 1 - 4 g bolus IVPB  1 g Intravenous Once  . metoprolol tartrate  50 mg Oral BID  . mirtazapine  7.5 mg Oral QHS  . pantoprazole (PROTONIX) IV  40 mg Intravenous Q12H  . potassium chloride  10 mEq Intravenous Q1 Hr x 5  . potassium chloride  20 mEq Oral Q6H   Continuous Infusions: . Marland KitchenTPN (CLINIMIX-E) Adult     And  . fat emulsion    . Marland KitchenTPN (CLINIMIX-E) Adult 60 mL/hr at 12/05/16 1723     LOS: 39 days    Time spent: 40 minutes   Dessa Phi, DO [facility] [email or web address] Password [facility] 12/06/2016, 12:19 PM

## 2016-12-06 NOTE — Progress Notes (Signed)
Occupational Therapy Treatment Patient Details Name: Melanie [name] MRN: [id number] DOB: [birth year] Today's Date: 12/06/2016    History of present illness 81 y.o. female with medical history significant of gallstones presents to the hospital with the chief complaint of abdominal pain. s/p laparoscopic cholecystectomy 10/30/16. Post op ileus, Exploratory laparotomy with LOA - findings:  Mechanical obstruction, 11/17/16, Dr. Dalbert Batman; Patient had third surgical intervention, emergent laparatomy d/t recurrent bowel obstruction with volvulus with ischemic segment, 2/16   OT comments  Pt participated in ADL with multiple rest breaks.  Tolerated well and transferred to chair. Daughter present  Follow Up Recommendations  SNF    Equipment Recommendations  3 in 1 bedside commode    Recommendations for Other Services      Precautions / Restrictions Precautions Precautions: Fall Restrictions Weight Bearing Restrictions: No       Mobility Bed Mobility         Supine to sit: Min assist;HOB elevated     General bed mobility comments: Assist for trunk.  Increased time. Pt relied on bedrail.   Transfers   Equipment used: Rolling walker (2 wheeled)   Sit to Stand: Min assist Stand pivot transfers: Min guard       General transfer comment: light assistance to rise and stabilize    Balance                                   ADL Overall ADL's : Needs assistance/impaired         Upper Body Bathing: Set up;Sitting   Lower Body Bathing: Moderate assistance;Sit to/from stand   Upper Body Dressing : Minimal assistance;Sitting       Toilet Transfer: Minimal assistance;RW;Stand-pivot (to recliner)             General ADL Comments: performed ADL from EOB; then transferred to chair. Encouraged rest breaks      Vision                     Perception     Praxis      Cognition   Behavior During Therapy: WFL for tasks assessed/performed Overall  Cognitive Status: Within Functional Limits for tasks assessed                         Exercises     Shoulder Instructions       General Comments      Pertinent Vitals/ Pain       Pain Assessment: Faces Faces Pain Scale: Hurts even more Pain Location: buttocks Pain Descriptors / Indicators: Sore;Grimacing Pain Intervention(s): Limited activity within patient's tolerance;Monitored during session;Repositioned  Home Living                                          Prior Functioning/Environment              Frequency  Min 2X/week        Progress Toward Goals  OT Goals(current goals can now be found in the care plan section)  Progress towards OT goals: Progressing toward goals     Plan      Co-evaluation                 End of Session    OT Visit Diagnosis: Unsteadiness on  feet (R26.81);Muscle weakness (generalized) (M62.81)   Activity Tolerance Patient tolerated treatment well   Patient Left in chair;with call bell/phone within reach;with chair alarm set   Nurse Communication  (dressing needed to be changed)        Time: [id number] OT Time Calculation (min): 32 min  Charges: OT General Charges $OT Visit: 1 Procedure OT Treatments $Self Care/Home Management : 23-37 mins  Lesle Chris, OTR/L [id number] 12/06/2016   [place]acey 12/06/2016, 12:02 PM

## 2016-12-07 LAB — COMPREHENSIVE METABOLIC PANEL
ALT: 24 U/L (ref 14–54)
ANION GAP: 7 (ref 5–15)
AST: 23 U/L (ref 15–41)
Albumin: 2.1 g/dL — ABNORMAL LOW (ref 3.5–5.0)
Alkaline Phosphatase: 166 U/L — ABNORMAL HIGH (ref 38–126)
BUN: 17 mg/dL (ref 6–20)
CHLORIDE: 106 mmol/L (ref 101–111)
CO2: 28 mmol/L (ref 22–32)
Calcium: 8.2 mg/dL — ABNORMAL LOW (ref 8.9–10.3)
Creatinine, Ser: 0.49 mg/dL (ref 0.44–1.00)
Glucose, Bld: 98 mg/dL (ref 65–99)
POTASSIUM: 4 mmol/L (ref 3.5–5.1)
Sodium: 141 mmol/L (ref 135–145)
TOTAL PROTEIN: 6.5 g/dL (ref 6.5–8.1)
Total Bilirubin: 0.7 mg/dL (ref 0.3–1.2)

## 2016-12-07 LAB — GLUCOSE, CAPILLARY
GLUCOSE-CAPILLARY: 115 mg/dL — AB (ref 65–99)
GLUCOSE-CAPILLARY: 118 mg/dL — AB (ref 65–99)
GLUCOSE-CAPILLARY: 88 mg/dL (ref 65–99)

## 2016-12-07 LAB — MAGNESIUM: MAGNESIUM: 2 mg/dL (ref 1.7–2.4)

## 2016-12-07 LAB — CBC WITH DIFFERENTIAL/PLATELET
BASOS PCT: 1 %
Basophils Absolute: 0.1 10*3/uL (ref 0.0–0.1)
EOS ABS: 0.2 10*3/uL (ref 0.0–0.7)
EOS PCT: 2 %
HCT: 23.8 % — ABNORMAL LOW (ref 36.0–46.0)
HEMOGLOBIN: 7.7 g/dL — AB (ref 12.0–15.0)
Lymphocytes Relative: 30 %
Lymphs Abs: 2.8 10*3/uL (ref 0.7–4.0)
MCH: 26.6 pg (ref 26.0–34.0)
MCHC: 32.4 g/dL (ref 30.0–36.0)
MCV: 82.4 fL (ref 78.0–100.0)
Monocytes Absolute: 0.9 10*3/uL (ref 0.1–1.0)
Monocytes Relative: 9 %
NEUTROS PCT: 58 %
Neutro Abs: 5.4 10*3/uL (ref 1.7–7.7)
PLATELETS: 378 10*3/uL (ref 150–400)
RBC: 2.89 MIL/uL — ABNORMAL LOW (ref 3.87–5.11)
RDW: 18 % — ABNORMAL HIGH (ref 11.5–15.5)
WBC: 9.4 10*3/uL (ref 4.0–10.5)

## 2016-12-07 LAB — PHOSPHORUS: PHOSPHORUS: 3.2 mg/dL (ref 2.5–4.6)

## 2016-12-07 MED ORDER — PROCHLORPERAZINE EDISYLATE 5 MG/ML IJ SOLN: 5.0000 mg | INTRAMUSCULAR | Status: DC | PRN

## 2016-12-07 MED ORDER — APAP 325 MG PO TABS: ORAL_TABLET | ORAL | Status: DC

## 2016-12-07 MED ORDER — CHLORTHALIDONE 25 MG PO TABS
25.0000 mg | ORAL_TABLET | Freq: Every day | ORAL | Status: DC
  Administered 2016-12-07 – 2016-12-08 (×2): 25 mg via ORAL
  Filled 2016-12-07 (×2): qty 1

## 2016-12-07 MED ORDER — OXYCODONE HCL 5 MG PO TABS: 5.0000 mg | ORAL_TABLET | Freq: Four times a day (QID) | ORAL | 0 refills | Status: DC | PRN

## 2016-12-07 MED ORDER — FAMOTIDINE 20 MG PO TABS
20.0000 mg | ORAL_TABLET | Freq: Every day | ORAL | Status: DC
  Administered 2016-12-07 – 2016-12-08 (×2): 20 mg via ORAL
  Filled 2016-12-07 (×2): qty 1

## 2016-12-07 MED ORDER — CLONIDINE HCL 0.2 MG/24HR TD PTWK: 0.2000 mg | MEDICATED_PATCH | TRANSDERMAL | 0 refills | Status: DC

## 2016-12-07 MED ORDER — TAB-A-VITE/IRON PO TABS
1.0000 | ORAL_TABLET | Freq: Every day | ORAL | Status: DC
  Administered 2016-12-08 – 2016-12-09 (×2): 1 via ORAL
  Filled 2016-12-07 (×2): qty 1

## 2016-12-07 MED ORDER — HYDRALAZINE HCL 50 MG PO TABS: 50.0000 mg | ORAL_TABLET | Freq: Four times a day (QID) | ORAL | 0 refills | Status: DC

## 2016-12-07 MED ORDER — LOSARTAN POTASSIUM 50 MG PO TABS: 50.0000 mg | ORAL_TABLET | Freq: Every day | ORAL | 0 refills | Status: DC

## 2016-12-07 MED ORDER — TAB-A-VITE/IRON PO TABS: 1.0000 | ORAL_TABLET | Freq: Every day | ORAL | 0 refills | Status: DC

## 2016-12-07 MED ORDER — PRO-STAT SUGAR FREE PO LIQD: ORAL | 0 refills | Status: DC

## 2016-12-07 MED ORDER — METOPROLOL TARTRATE 50 MG PO TABS: 50.0000 mg | ORAL_TABLET | Freq: Two times a day (BID) | ORAL | 0 refills | Status: DC

## 2016-12-07 MED ORDER — MIRTAZAPINE 15 MG PO TBDP
7.5000 mg | ORAL_TABLET | Freq: Every day | ORAL | 0 refills | Status: DC
Start: 2016-12-07 — End: 2016-12-12

## 2016-12-07 MED ORDER — CHLORTHALIDONE 25 MG PO TABS: 25.0000 mg | ORAL_TABLET | Freq: Every day | ORAL | 0 refills | Status: DC

## 2016-12-07 NOTE — Progress Notes (Addendum)
PROGRESS NOTE    Melanie [name]  [id number] DOB: Oct 11, 1935 DOA: 10/28/2016 PCP: Osborne Casco, MD     Brief Narrative:  Melanie [name] is a 81 y.o. female with prolonged hospitalization, admitted on 01/20 for necrotic cholecystitis. She developed post op bowel obstruction, re-intervened on 02/09 with exploratory laparotomy. NG tube in place, on TPN. TRH was consulted for chest pain that ruled out for ACS. Patient has been in persistent pain, on PCA pump. Patient had third surgical intervention, emergent laparatomy, recurrent bowel obstruction with volvulus with ischemic segment. Resumed clonidine for blood pressure control. Worsening hypertension, started on nicardipine drip.Clonidine dose increase to 0.2 mg, patient now off nicardipine.   Assessment & Plan:   Principal Problem:   Small bowel obstruction s/p ex lap & lysis of adhesions 11/17/2016 Active Problems:   Necrotic cholecystitis s/p lap cholecystectomy 10/30/2016   Essential hypertension   Hyperlipidemia   COPD (chronic obstructive pulmonary disease) (HCC)   GERD (gastroesophageal reflux disease)   Chest pain   H/O exploratory laparotomy   Anxiousness   Pressure injury of skin  Necrotic cholecystitis Post-op bowel obstruction as well as small bowel volvulus  -S/p lap chole 1/22, s/p ex lap with LOA 2/9, s/p ex lap with LOA and reduction of small bowel volvulus 2/16  -Management per primary team (surgery) -Currently tolerating TPN and soft diet   Atypical Chest Pain -Resolved  V-tach -Patient had asymptomatic episode of non-sustained v-tach. Continue telemetry   Hypertension secondary to unilateral RAS  -Renal artery duplex completed 2/28 with right renal artery stenosis > 60%. Revascularization is only indicated for short duration of elevated BP, failure of optimal medical therapy or intolerance to therapy, or recurrent flash pulmonary edema or refractory heart failure. Patient tells me that she has had  uncontrolled HTN for 30 years and has been on many different antihypertensives, most recently on 4-drug regimen.  -Will continue to maximize medical therapy for now as below  -Unable to tolerate ACE due to cough. Losartan increased to 50mg . Continue hydralazine 50mg  q6h, metoprolol 50mg  BID, clonidine patch q weekly. Will add chlorthalidone today in favor of lasix.  -BP today is better at 154/59, but elevated on recheck this afternoon. Will add chlorthalidone today in favor of lasix.  -Would be reasonable to discharge on current regimen as above. Continue to monitor as outpatient as she recovers from her acute illnesses at SNF. She should follow up with her PCP with regular BMP checks. If her blood pressure continues to be uncontrolled, would be reasonable to refer to Nephrology as outpatient for secondary hypertension treatment. Please note that renin/aldosterone level is pending at this time   COPD -Stable  -Duoneb QID prn  Anemia -Stable. Morning labs today are pending. Transfuse for Hgb < 7  Chronic diastolic heart failure -Grade 2 diastolic heart failure with EF of 60-65%   DVT prophylaxis: lovenox Code Status: Full Family Communication: daughter at bedside Disposition Plan: per surgical team   Procedures:   Laparoscopic cholecystectomy 10/30/16  Exploratory laparotomy with LOA 11/17/16  ECHO 2/12- EF 60-65%, G1DD.  Exploratory laparotomy 11/24/16  Antimicrobials:   Zosyn (1/20-1/24, 1/27-2/9, 2/15-2/18)   Subjective: States that she is hungry, no abdominal pain, nausea, or vomiting. Had a loose runny BM this morning without any overt blood.    Objective: Vitals:   12/06/16 1529 12/06/16 2144 12/06/16 2332 12/07/16 0558  BP: (!) 164/65 (!) 154/62 (!) 158/57 (!) 154/59  Pulse: 76 86  76  Resp: 20 20  Temp: 97.5 F (36.4 C) 97.8 F (36.6 C)  99.1 F (37.3 C)  TempSrc: Oral Oral  Oral  SpO2: 95% 97%  100%  Weight:    87.6 kg (193 lb 2 oz)  Height:         Intake/Output Summary (Last 24 hours) at 12/07/16 0900 Last data filed at 12/07/16 0600  Gross per 24 hour  Intake             1665 ml  Output                0 ml  Net             1665 ml   Filed Weights   12/05/16 0547 12/06/16 0527 12/07/16 0558  Weight: 87.3 kg (192 lb 7.4 oz) 87.1 kg (192 lb 0.3 oz) 87.6 kg (193 lb 2 oz)    Examination:  General exam: Appears calm and comfortable  Respiratory system: Clear to auscultation. Respiratory effort normal. Cardiovascular system: S1 & S2 heard, RRR. No JVD, murmurs, rubs, gallops or clicks. +1 pedal edema. Gastrointestinal system: Abdomen is nondistended, soft and mildly TTP, midline incision is clean and dry, staples in tact. No organomegaly or masses felt. Normal bowel sounds heard. Central nervous system: Alert and oriented. No focal neurological deficits. Extremities: Symmetric 5 x 5 power. Skin: No rashes, lesions or ulcers Psychiatry: Judgement and insight appear normal. Mood & affect appropriate.   Data Reviewed: I have personally reviewed following labs and imaging studies  CBC:  Recent Labs Lab 12/02/16 0522 12/04/16 0441 12/06/16 0036  WBC 13.1* 11.2* 9.6  NEUTROABS  --  8.2*  --   HGB 8.0* 7.7* 7.3*  HCT 24.1* 23.2* 22.6*  MCV 80.6 80.8 81.6  PLT 393 375 [id number]   Basic Metabolic Panel:  Recent Labs Lab 12/03/16 1325 12/04/16 0441 12/04/16 2009 12/05/16 0401 12/06/16 0500 12/07/16 0511  NA 136 138  --  141 142 141  K 2.4* 2.7* 3.0* 3.1* 3.0* 4.0  CL 100* 101  --  103 105 106  CO2 28 30  --  30 30 28   GLUCOSE 121* 132*  --  124* 108* 98  BUN 17 17  --  20 18 17   CREATININE 0.47 0.45  --  0.45 0.43* 0.49  CALCIUM 8.2* 8.2*  --  8.3* 8.2* 8.2*  MG  --  1.5* 2.3 2.1 2.0 2.0  PHOS  --  3.5  --   --   --  3.2   GFR: Estimated Creatinine Clearance: 59.6 mL/min (by C-G formula based on SCr of 0.49 mg/dL). Liver Function Tests:  Recent Labs Lab 12/04/16 0441 12/07/16 0511  AST 23 23  ALT 29 24   ALKPHOS 179* 166*  BILITOT 0.5 0.7  PROT 5.8* 6.5  ALBUMIN 1.8* 2.1*   No results for input(s): LIPASE, AMYLASE in the last 168 hours. No results for input(s): AMMONIA in the last 168 hours. Coagulation Profile: No results for input(s): INR, PROTIME in the last 168 hours. Cardiac Enzymes: No results for input(s): CKTOTAL, CKMB, CKMBINDEX, TROPONINI in the last 168 hours. BNP (last 3 results) No results for input(s): PROBNP in the last 8760 hours. HbA1C: No results for input(s): HGBA1C in the last 72 hours. CBG:  Recent Labs Lab 12/05/16 1639 12/06/16 0826 12/06/16 1228 12/06/16 1734 12/07/16 0733  GLUCAP 122* 111* 101* 112* 115*   Lipid Profile: No results for input(s): CHOL, HDL, LDLCALC, TRIG, CHOLHDL, LDLDIRECT in the last 72 hours. Thyroid  Function Tests: No results for input(s): TSH, T4TOTAL, FREET4, T3FREE, THYROIDAB in the last 72 hours. Anemia Panel: No results for input(s): VITAMINB12, FOLATE, FERRITIN, TIBC, IRON, RETICCTPCT in the last 72 hours. Sepsis Labs: No results for input(s): PROCALCITON, LATICACIDVEN in the last 168 hours.  Recent Results (from the past 240 hour(s))  MRSA PCR Screening     Status: Abnormal   Collection Time: 11/28/16 12:40 PM  Result Value Ref Range Status   MRSA by PCR POSITIVE (A) NEGATIVE Final    Comment:        The GeneXpert MRSA Assay (FDA approved for NASAL specimens only), is one component of a comprehensive MRSA colonization surveillance program. It is not intended to diagnose MRSA infection nor to guide or monitor treatment for MRSA infections. RESULT CALLED TO, READ BACK BY AND VERIFIED WITH: F.AKAND RN AT [id number] ON 11/28/16 BY S.VANHOORNE        Radiology Studies: Dg Abd 1 View  Result Date: 12/06/2016 CLINICAL DATA:  Mid abdominal pain. Recent recurrent small bowel obstruction, with surgery on 11/24/2016. EXAM: ABDOMEN - 1 VIEW COMPARISON:  11/27/2016 FINDINGS: There are no dilated loops of large or small  bowel. No visible free air or free fluid on these supine radiographs. Surgical staples are seen in the midline. No acute bone abnormality. NG tube has been removed. IMPRESSION: No residual dilated small bowel loops.  Benign-appearing abdomen. Electronically Signed   By: Lorriane Shire M.D.   On: 12/06/2016 11:02      Scheduled Meds: . acetaminophen  1,000 mg Oral Q6H  . cloNIDine  0.2 mg Transdermal Weekly  . enoxaparin (LOVENOX) injection  40 mg Subcutaneous Q24H  . feeding supplement (PRO-STAT SUGAR FREE 64)  30 mL Oral BID  . furosemide  20 mg Oral Daily  . hydrALAZINE  50 mg Oral Q6H  . insulin aspart  0-9 Units Subcutaneous TID WC  . lip balm  1 application Topical BID  . losartan  50 mg Oral Daily  . metoprolol tartrate  50 mg Oral BID  . mirtazapine  7.5 mg Oral QHS  . pantoprazole (PROTONIX) IV  40 mg Intravenous Q12H   Continuous Infusions: . Marland KitchenTPN (CLINIMIX-E) Adult 40 mL/hr at 12/06/16 1718     LOS: 40 days    Time spent: 30 minutes   Dessa Phi, DO [facility] [email or web address] Password [email or web address] 12/07/2016, 9:00 AM

## 2016-12-07 NOTE — Discharge Summary (Signed)
Physician Discharge Summary  Patient ID: Melanie [name] MRN: [id number] DOB/AGE: 1936/03/28 81 y.o.  Admit date: 10/28/2016 Discharge date: 12/09/2016  Admission Diagnoses:  Biliary colic  Hypertension COPD GERD   Discharge Diagnoses:  Necrotic cholecystitis with free intraperitoneal bile,  Postoperative small bowel obstruction Post operative volvulus Postoperative chest pain with elevated troponins Anemia - with hospital transfusion. Severe protein calorie malnutrition Hypertension with documented right renal artery stenosis. GERD - chronic PPI. 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 Postoperative decubitus Severe deconditioned state Diarrhea-C. Diff negative      Principal Problem:   Small bowel obstruction s/p ex lap & lysis of adhesions 11/17/2016 Active Problems:   Necrotic cholecystitis s/p lap cholecystectomy 10/30/2016   Essential hypertension   Hyperlipidemia   COPD (chronic obstructive pulmonary disease) (HCC)   GERD (gastroesophageal reflux disease)   Chest pain   H/O exploratory laparotomy   Anxiousness   Pressure injury of skin   PROCEDURES:  1.  Laparoscopic Cholecystectomy with intraoperative cholangiogram, 10/30/16, Dr. Johnathan Hausen 2.  Exploratory laparotomy with LOA, 11/17/16, Dr. Dalbert Batman - findings: Mechnical obstruction 3.  Exploratory laparotomy, LOA, reduction of SB volvulus 2/16- Dr. Zella Richer,  Hospital Course:  Patient was admitted by medicine on 10/28/16, with acute complaints of abdominal pain. Been having pain for the last 4 weeks was intermittent and localized to the right upper quadrant. She was actually scheduled to see general surgery the following week but presented with acute discomfort.  Because of her multiple medical issues she was seen and admitted by medicine and seen in consultation by Dr. Kimberlee Nearing.  MRCP was ordered, she was found to have symptomatic: Cholelithiasis and possible  choledocholithiasis. Ultrasound showed stone and sludge in gallbladder without gallbladder wall thickening.  No pericholecystic fluid CT scan showed a distended gallbladder with some stones and a slightly prominent common bile duct. The MR. CP showed no radiologic evidence of choledocholithiasis or obstruction. She was taken to the operating room on 10/30/16 and underwent laparoscopic cholecystectomy with intraoperative cholangiogram. Patient was found to have a necrotic gallbladder with free intraperitoneal bowel, an enlarged common bile duct with free flow into the duodenum. Postop she was very uncomfortable, she required a great deal of morphine for pain control. Her blood pressure was also significantly elevated and was treated with hydralazine since she was nothing by mouth. She developed hypokalemia and required potassium replacement throughout her hospitalization. She was slow to mobilize but by the third postoperative day she was walking short distances down the hall. She was admitted to heart healthy diet, she has some loose stools and was checked for C. difficile which was negative. She maintains slightly elevated LFTs. Her white count started going up, she continued to have abdominal discomfort and a repeat CT scan showed an ileus. She was having ongoing episodes of severe nausea and abdominal pain along with some small loose stools mixed with urine. Her WBC continued to go up and a HIDA scan was performed to rule out a leak. HIDA scan was negative. Lipase was checked to rule out pancreatitis and this was also negative. Her diet was slowly advanced but she continued to have pain with PO. Because she became progressively worse and NG was placed on 11/08/16 at 1 L was drained from her stomach. Unfortunately she did not progress, and on 29/18 she was taken back to the operating room for exploratory laparotomy was found to have an adhesive small bowel obstruction she underwent exploratory laparotomy with  lysis  of adhesions. 11/17/16 by Dr. Dalbert Batman.  Postop on 11/20/16 she was complaining of midsternal chest pain she was febrile with a temperature to 100.1 tachycardic with heart rate of 104 hypertensive, sats are 100% but she had rhonchi with cough. EKG showed sinus rhythm with some nonspecific ST changes. She was transferred to the stepdown care unit. Medicine and signed off and we asked them to see again and follow for chest pain. She reported relief with sublingual nitroglycerin and nitroglycerin paste was added. She did have a slight elevation of her troponins, this was eventually attributed to stress. Echocardiogram showed an EF that was 60-65% with no other abnormalities. Her pain was attributed to esophageal spasm. She had an NG in place still and this was continued. She was transferred to the floor and seen the following day on 11/23/16 which time she was still having severe pain in bed. She was taking large amounts of up to:  67 mg of morphine IV through the PCA and additional injections. We attempted to adjust her pain medications and switch her to something else. A new CT was obtained on 11/23/16. CT did not show any source of obstruction there was a questionable possible seroma she had a low-grade fever and rising white count was empirically started back on Zosyn. The following a.m. she continued to have uncontrolled abdominal pain with no reasonable you find on exam. Her WBC was rising and she was tachycardic and after examination by Dr. Zella Richer, she was taken back to the operating room for exploratory laparotomy to look for source of her ongoing severe abdominal pain. At time of surgery she was found to have recurrent small bowel obstruction:  mesenteric volvulus with transient jejunal segment ischemia. She underwent emergent exploratory laparotomy lysis of adhesions and reduction of volvulus. 11/24/16 Dr. Zella Richer. Postop she was placed back in the intensive care unit. She continued to have ongoing abdominal  pain postoperatively another CT scan was obtained by medicine on 11/27/16, to rule out acute aortic dissection. This had multiple findings including emphysema, cardiomegaly with biatrial dilatation. 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 The small bowel dilatation actually appeared improved. She was kept on NG suction until she had return of bowel function. The NG was eventually clamped and then discontinued. Her diet was slowly advanced. This was a very slow process. She developed diarrhea. Her pain issues improve slowly and eventually reported clamp her NG and remove it. Even with starting clears her progress was slow she had intermittent issues with abdominal pain with stop beating. No further obstruction was found. She had been maintained on TNA for some time. She had significant fluid overload. We were slowly able to mobilize her. This was very difficult and still takes 2 people to get her up to chair or back to bed. Physical therapy was asked to see and help mobilize her. A recommendation for skilled nursing facility was made. The patient and family were agreeable to that. With improvement in her bowel function, medicine was able to go forward with a hypertension evaluation. This included renal Dopplers which showed 60% stenosis on the right, left side was more difficult to see that listed is between 1 and 59% stenosis. Dr. Loistine Chance saw the patient for palliative medicine. Goals of care were discussed. Patient was essentially concerned she would never get better and did not want to be dependent on family. We were able to eventually wean her off her pain  medications, slowly mobilize her, and get her back on a diet. At this point things turned around and she improved significantly. By 12/08/15, she was making good progress. We weaned her off her TNA completely. Pain control was by mouth only. She was tolerating more of her diet, she was having some loose stools but  just a couple per day. We consider repeating the PCR for C. difficile but she was on a Dulcolax suppository daily and this was discontinued. She also has a small stage I decubitus. She is now out of bed more and up in the chair. She is also walking some in the hallways with the staff. She still takes 2 people to get her up and get her down, from bed to chair. She is not able stand up straight on her own. Her blood pressure is showing improvement. We will plan to take the staples out and Steri-Strip the wound and discharge to SNF.     Disposition:    Allergies as of 12/09/2016      Reactions   Nsaids    hypertension   Ace Inhibitors Other (See Comments)   cough      Medication List    STOP taking these medications   amoxicillin 875 MG tablet Commonly known as:  AMOXIL   FLUZONE HIGH-DOSE 0.5 ML Susy Generic drug:  Influenza Vac Split High-Dose   hydrochlorothiazide 25 MG tablet Commonly known as:  HYDRODIURIL   multivitamin tablet   nebivolol 5 MG tablet Commonly known as:  BYSTOLIC   potassium chloride SA 20 MEQ tablet Commonly known as:  K-DUR,KLOR-CON     TAKE these medications   amLODipine 10 MG tablet Commonly known as:  NORVASC Take 1 tablet (10 mg total) by mouth daily. What changed:  how much to take   APAP 325 MG tablet She can take 2-3 tablets every 6 hours as needed for pain.   aspirin EC 81 MG tablet Take 81 mg by mouth daily.   carvedilol 12.5 MG tablet Commonly known as:  COREG Take 1 tablet (12.5 mg total) by mouth 2 (two) times daily with a meal.   chlorthalidone 25 MG tablet Commonly known as:  HYGROTON Take 2 tablets (50 mg total) by mouth daily.   cloNIDine 0.1 MG tablet Commonly known as:  CATAPRES Take 0.1 mg by mouth daily.   feeding supplement (PRO-STAT SUGAR FREE 64) Liqd You can use this or whatever protein supplement you enjoy.   hydrALAZINE 50 MG tablet Commonly known as:  APRESOLINE Take 1 tablet (50 mg total) by mouth 4  (four) times daily.   losartan 50 MG tablet Commonly known as:  COZAAR Take 1 tablet (50 mg total) by mouth daily.   mirtazapine 15 MG disintegrating tablet Commonly known as:  REMERON SOL-TAB Take 0.5 tablets (7.5 mg total) by mouth at bedtime.   multivitamins with iron Tabs tablet Take 1 tablet by mouth daily.   omeprazole 40 MG capsule Commonly known as:  PRILOSEC Take 40 mg by mouth daily.   oxyCODONE 5 MG immediate release tablet Commonly known as:  Oxy IR/ROXICODONE Take 1-2 tablets (5-10 mg total) by mouth every 6 (six) hours as needed for moderate pain (pain not relieved by tylenol/acetaminophen).       Contact information for follow-up providers    MARTIN,MATTHEW B, MD Follow up.   Specialty:  General Surgery Contact information: [address] [place] [address] [phone]        GRIFFIN,ELAINE COLLINS,  MD Follow up.   Specialty:  Family Medicine Why:  Call and make an appointment for medical management as soon as she noted being discharged from the skilled nursing facility. Contact information: [address] [place] [address] [phone]        Curt Jews, MD Follow up.   Specialties:  Vascular Surgery, Cardiology Why:  Follow up once discharged from skilled nursing facility regarding asymptomatic carotid stenosis (has seen Dr. Donnetta Hutching in Feb 2017) and also new unilateral right renal artery stenosis  Contact information: [place] [address] [phone]            Contact information for after-discharge care    Destination    [facility] Follow up.   Specialty:  [place] information: [address] [place] [phone]                  Signed: Odis Hollingshead 12/09/2016, 10:04 AM

## 2016-12-07 NOTE — Progress Notes (Signed)
13 Days Post-Op  Subjective: She looks pretty good, all things considered. We got her into bed in order to look at her decubitus. This is a two-person process. She is not strong enough to stand up on her own and stand straight. Her abdominal wound looks good. She did not complain of abdominal pain today. She says she is still having some loose stools but just a couple per day. She does have a stage I decubitus on her  buttocks. Pictures below. This being treated with barrier cream and silicone dressings. I do not have much to add to that care.  Objective: Vital signs in last 24 hours: Temp:  [97.5 F (36.4 C)-99.1 F (37.3 C)] 99.1 F (37.3 C) (03/01 0558) Pulse Rate:  [76-86] 76 (03/01 1259) Resp:  [20] 20 (03/01 1259) BP: (154-182)/(57-65) 165/57 (03/01 1259) SpO2:  [95 %-100 %] 96 % (03/01 1259) FiO2 (%):  [2 %] 2 % (03/01 0558) Weight:  [87.6 kg (193 lb 2 oz)] 87.6 kg (193 lb 2 oz) (03/01 0558) Last BM Date: 12/06/16 PO not recorded 1700 IV Voided x 2 recorded BM x 2 Afebrile, BP still up but stable Anemia stable Renal ultrasound:  Study was technically difficult and limited due to overlying bowel gas.  Findings suggest >60% right renal artery stenosis and 1-59% left renal artery stenosis. Other specific details can be found in the table(s) above. Intake/Output from previous day: 02/28 0701 - 03/01 0700 In: 1715 [I.V.:1465; IV Piggyback:250] Out: -  Intake/Output this shift: No intake/output data recorded.  General appearance: alert, cooperative and no distress Resp: clear to auscultation bilaterally Cardio: S1: normal and Rate and rhythm. GI: Soft, sore, staples intact, no distention she did not have any significant discomfort positive bowel sounds, positive BM.     Lab Results:   Recent Labs  12/06/16 0036 12/07/16 1044  WBC 9.6 9.4  HGB 7.3* 7.7*  HCT 22.6* 23.8*  PLT 365 378    BMET  Recent Labs  12/06/16 0500 12/07/16 0511  NA 142 141  K 3.0*  4.0  CL 105 106  CO2 30 28  GLUCOSE 108* 98  BUN 18 17  CREATININE 0.43* 0.49  CALCIUM 8.2* 8.2*   PT/INR No results for input(s): LABPROT, INR in the last 72 hours.   Recent Labs Lab 12/04/16 0441 12/07/16 0511  AST 23 23  ALT 29 24  ALKPHOS 179* 166*  BILITOT 0.5 0.7  PROT 5.8* 6.5  ALBUMIN 1.8* 2.1*     Lipase     Component Value Date/Time   LIPASE 13 11/11/2016 0945     Studies/Results: Dg Abd 1 View  Result Date: 12/06/2016 CLINICAL DATA:  Mid abdominal pain. Recent recurrent small bowel obstruction, with surgery on 11/24/2016. EXAM: ABDOMEN - 1 VIEW COMPARISON:  11/27/2016 FINDINGS: There are no dilated loops of large or small bowel. No visible free air or free fluid on these supine radiographs. Surgical staples are seen in the midline. No acute bone abnormality. NG tube has been removed. IMPRESSION: No residual dilated small bowel loops.  Benign-appearing abdomen. Electronically Signed   By: Lorriane Shire M.D.   On: 12/06/2016 11:02    Medications: . acetaminophen  1,000 mg Oral Q6H  . cloNIDine  0.2 mg Transdermal Weekly  . enoxaparin (LOVENOX) injection  40 mg Subcutaneous Q24H  . feeding supplement (PRO-STAT SUGAR FREE 64)  30 mL Oral BID  . furosemide  20 mg Oral Daily  . hydrALAZINE  50 mg Oral Q6H  .  lip balm  1 application Topical BID  . losartan  50 mg Oral Daily  . metoprolol tartrate  50 mg Oral BID  . mirtazapine  7.5 mg Oral QHS  . pantoprazole (PROTONIX) IV  40 mg Intravenous Q12H    Assessment/Plan Admitted 1/20 - Medicine S/p Laparoscopic Cholecystectomy with intraoperative cholangiogram, 10/30/16, Dr. Rodman Key Martin;Necrotic gallbladder with free intrapertioneal bile; enlarged CBD with free flow into the duodenum POD 36 Exploratory laparotomy with LOA - findings: Mechanical obstruction, 11/17/16, Dr. Dalbert Batman POD 20 Exploratory laparotomy, LOA, reduction of SB volvulus 2/16- Dr. Zella Richer, POD 13 Swelling right arm - Doppler is negative  for DVT Hypokalemia- being replaced Chest pain with elevated troponin -improved.  Echo EF 0000000 1 diastolic dysfunction/Trivial MR- continue prn nitro Anemia Stable post transfusion. 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 - on chronic antibiotics History MRSA - On isolation FEN: IV fluids, TNA/starting soft diet ID: Zosyn 1/21- 11/16/16, on again 2/15-2/17.  No abscess at time of surgery. Currently no antibitotics. WBC improved DVT prophylaxis: Lovenox   Plan:  Medicine is adjusting her blood pressure medications. If her blood pressure is well controlled with the current modification Dr.  Clifton James, think she can go to skilled nursing facility tomorrow. She is tolerating a diet and we are discontinuing the TNA. Her diarrhea has improved. The stage I decubitus can be treated locally. I reinforced that being off her bottom, and good nutrition of the most important parts of healing this area. She remains profoundly weak, and clearly needs skilled nursing facility and rehabilitation care.       LOS: 40 days    Traye Bates 12/07/2016 [phone]

## 2016-12-07 NOTE — Progress Notes (Signed)
[facility] NOTE   Pharmacy Consult for TPN Indication: prolonged ileus  Patient Measurements: Height: 5' 2"  (157.5 cm) Weight: 193 lb 2 oz (87.6 kg) IBW/kg (Calculated) : 50.1 TPN AdjBW (KG): 60.9 Body mass index is 35.32 kg/m.  Insulin Requirements: 0 Unit SSI yesterday  Current Nutrition: Soft diet ordered, Prostat BID  IVF: none  Central access: PICC TPN start date: 11/08/16  ASSESSMENT                                                                   HPI:  81 y.o F with hx of gallstones presented to the ED on 1/20 with c/o abdominal pain. MRCP on 1/22 showed distended gallbladder with tiny gallstones and wall thickening with suspicion for acute cholecystitis. Patient underwent lap cholecystectomy with intraoperative cholangiogram on 1/22.  To start TPN for prolonged ileus and poor oral intake.  Significant events:  1/22: lap chole with cholangiogram 1/30: abd KUB with suspicion for post-op ileus; NG tube placed  2/2: high NG output from 1/31-2/1 (1310m) but decreased 2/1-2/2 (300 ml). RN reported foul smell from NG tube --CCS may d/c NG 2/3: trial clamping NG - successful 2/4 NGT removed. Switching from E 5/20 to E 5/15 to better align with nutritional goals 2/6 leave NGT out, continue NPO, GI consult 2/8 Xray consistent with ileus or SBO - possible ex lap tomorrow 2/9: Underwent exploratory laparotomy, lysis of adhesions.  2/16: back to OR for recurrent SBO.  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 2/21: starting clear liquids; fluid-overloaded per CCS, starting lasix and stopping IVF. Nutrition requesting increasing goal rate to give full 2L daily (83 ml/hr) as patient with several surgeries since last assessment 2/23-4: taking Pro-stat bid, total 30gm protein/day. Added Ensure 2/24 - refused all 2/26: Ensure and ProStat recently documented but per RN negligible intake. RN also notes some bloody emesis this   2/27: started on Remeron by palliative MD. Taking partial supplements, but appetite is poor. Soft diet started. New diarrhea w/ mucus and ?blood in stool - surgery to evaluate 2/28: tolerating soft diet well. SBO resolved on imaging. Diarrhea/abd pain continues 3/1: discontinue TPN,  Today 12/07/16:   Glucose: no Hx DM, CBGs all controlled (goal 100-150 mg/dL)  Electrolytes:Lytes wnl. Note Pt on Lasix 243mpo qd, HCTZ stopped  Renal: SCr/BUN ok; continues to diurese from fluid overload but limited by hypokalemia  LFTs:  2/26 - Alk phos still minimally elevated but improving; AST/ALT improved to wnl. T bili now wnl, albumin remains low  TGs: stable wnl 2/26  Prealbumin: Low but improved 2/26  NUTRITIONAL GOALS                                                                                             RD recs (2/21) Kcal:  1600-1800kcal/day  Protein:  105-120g/day  Fluid:  >1.6L/day  Clinimix E 5/15 (switched from E 5/20 at 70 ml/hr due to availability) at a goal rate of 75 ml/hr + 20% fat emulsion at 20 ml/hr x 12 hr to provide:  90 g/day protein, 1758 Kcal/day.  New regimen per RD suggestions 2/21: Clinimix E 5/15 at 83 ml/hr + 20 Lipids at 20 ml/hr x 12 hr will provide 100gm protein, ~1900 kCal per day  (+++There is currently a Psychologist, prison and probation services of Clinimix solution. Pharmacy will use Clinimix product based on availability+++)  PLAN    At 1800 today:  Discontinue TPN, no need to reduce rate further before stopping, CBG's well controlled  Discontinue TPN lab panels on Mondays & Thursdays  Minda Ditto PharmD Pager [phone] 12/07/2016, 11:16 AM

## 2016-12-07 NOTE — Progress Notes (Signed)
Nutrition Follow-up  DOCUMENTATION CODES:   Severe malnutrition in context of acute illness/injury  INTERVENTION:  - Continue 30 mL Prostat BID. - Continue to encourage PO intakes of meals and supplements. - Recommend d/c TPN today.   NUTRITION DIAGNOSIS:   Malnutrition related to acute illness as evidenced by energy intake < or equal to 50% for > or equal to 5 days, mild depletion of body fat, severe depletion of muscle mass. -ongoing  GOAL:   Patient will meet greater than or equal to 90% of their needs -minimally met at this time.   MONITOR:   PO intake, Supplement acceptance, Weight trends, Labs, Skin, I & O's, Other (Comment) (TPN regimen)  ASSESSMENT:   81 y.o. female with medical history significant of gallstones presents to the hospital with the chief complaint of abdominal pain. s/p laparoscopic cholecystectomy 10/30/16. Post op ileus, Exploratory laparotomy with LOA - findings:  Mechanical obstruction, 11/17/16, Dr. Dalbert Batman; Patient had third surgical intervention, emergent laparatomy d/t recurrent bowel obstruction with volvulus with ischemic segment, 2/16  Significant events: 1/22: lap chole with cholangiogram 1/30: abd KUB with suspicion for post-op ileus; NG tube placed 2/2: high NG output from 1/31-2/1 (1398m) but decreased 2/1-2/2 (300 ml). RN reported foul smell from NG tube --CCS may d/c NG 2/3: trial clamping NG - successful 2/4 NGT removed. Switching from E 5/20 to E 5/15 to better align with nutritional goals 2/6 leave NGT out, continue NPO, GI consult 2/8 Xray consistent with ileus or SBO - possible ex lap tomorrow 2/9: Underwent exploratorylaparotomy, lysis of adhesions.  2/12 Transferred to ICU for chest pain, elevated troponin. 2/16-Acute abdomen with sepsis-Emergency exploratory laparotomy, lysis of adhesions, reduction of volvulus. 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    3/1 No intakes documented since previous assessment. Pt reports she was able to eat very little of breakfast this AM d/t food being cold by the time she was able to eat. Encouraged her to re-order meal trays should this happen in the future; she verbalizes understanding. For dinner last night she ordered a grilled cheese sandwich and ate ~50% of this item. She denies any abdominal pain, pressure, or nausea with eating last night. She is currently sipping on Prostat mixed with apple juice. Again re-iterated the importance of this supplement and pt understands and is willing to continue consuming them.   RD had talked with Pharmacist yesterday who had spoken with Surgery PA. Pharmacist reported plan to decrease Clinimix E 5/15 to 40 mL/hr and plan to d/c TPN entirely 3/1 or 3/2; passed this information along to Pharmacist working on the floor today. Talked with pt about this possibility and she reports that she is very excited about the idea of d/c'ing TPN. Weight stable from previous assessment.   Medications reviewed; 20 mg oral Lasix/day, sliding scale Novolog, 1 g IV Mg sulfate x1 dose today, 40 mg IV Protonix BID, 10 mEq IV KCl x5 runs yesterday, 20 mEq oral KCl QID.  Labs reviewed; Ca: 8.2 mg/dL, Alk Phos elevated.    2/27 - Pt was consuming tomato soup and pudding for lunch at time of visit.  - No intakes documented since previous assessment.  - Dr. ARowe Pavy  met with pt around lunch time yesterday and her note indicates pt had consumed ~15% of the meal.  - Ongoing abdominal pain which is dull but slightly worsened by PO intakes; denies worsening with physical activity.  - Pt denies nausea.  - Ongoing poor appetite but is excited about diet advancement.  - Reviewed  foods that are and are not provided on Soft diet with pt and her daughter, who is at bedside.  - She had tried Ensure when she was in a rehab facility in the past and reported nausea and loose stools at that time and that and currently.  - She has not yet tried Prostat.  - Talked with pt at length about this supplement and the rationale for it and provided ongoing encouragement for her to use it; pt reports she is willing to try it later today.  - Per review weight +29 lbs from 11/08/16.  - Pt continues with Clinimix E 5/15 @ 83 mL/hr with 20% ILE @ 20 mL/hr.  - Spoke with Pharmacist earlier today and plan is to decrease TPN rate to encourage return of appetite/increased PO intakes and to decrease volume load as plan is to not increase Lasix d/t concern for worsening hypokalemia.   Per order, today at 1800 TPN regimen will be Clinimix E 5/15 @ 60 mL/hr with 20% ILE @ 20 mL/hr over 12 hours. This regimen will provide 72 grams of protein (69% minimum estimated protein need) and 1502 kcal (94% minimum estimated kcal need).     Diet Order:  DIET SOFT Room service appropriate? Yes; Fluid consistency: Thin .TPN (CLINIMIX-E) Adult  Skin:  Wound (see comment) (Stage 2 wound to anus)  Last BM:  2/28  Height:   Ht Readings from Last 1 Encounters:  11/28/16 5' 2"  (1.575 m)    Weight:   Wt Readings from Last 1 Encounters:  12/07/16 193 lb 2 oz (87.6 kg)    Ideal Body Weight:  52.3 kg  BMI:  Body mass index is 35.32 kg/m.  Estimated Nutritional Needs:   Kcal:  1600-1800kcal/day   Protein:  105-120g/day   Fluid:  >1.6L/day   EDUCATION NEEDS:   Education needs addressed    Jarome Matin, MS, RD, LDN, CNSC Inpatient Clinical Dietitian Pager # [phone] After hours/weekend pager # [phone]

## 2016-12-07 NOTE — Clinical Social Work Placement (Signed)
Patient has accepted a bed at [facility]. CSW has completed FL2 & will continue to follow and assist with discharge when ready.    Raynaldo Opitz, [facility] Clinical Social Worker cell #: [phone]     CLINICAL SOCIAL WORK PLACEMENT  NOTE  Date:  12/07/2016  Patient Details  Name: Melanie [name] MRN: [id number] Date of Birth: 1936/09/30  Clinical Social Work is seeking post-discharge placement for this patient at the [place] level of care (*CSW will initial, date and re-position this form in  chart as items are completed):  Yes   Patient/family provided with [facility] Work Department's list of facilities offering this level of care within the geographic area requested by the patient (or if unable, by the patient's family).  Yes   Patient/family informed of their freedom to choose among providers that offer the needed level of care, that participate in Medicare, Medicaid or managed care program needed by the patient, have an available bed and are willing to accept the patient.  Yes   Patient/family informed of [facility]'s ownership interest in [facility] and [facility], as well as of the fact that they are under no obligation to receive care at these facilities.  PASRR submitted to EDS on       PASRR number received on       Existing PASRR number confirmed on 12/03/16     FL2 transmitted to all facilities in geographic area requested by pt/family on 12/05/16     FL2 transmitted to all facilities within larger geographic area on 12/05/16     Patient informed that his/her managed care company has contracts with or will negotiate with certain facilities, including the following:        Yes   Patient/family informed of bed offers received.  Patient chooses bed at [facility]     Physician recommends and patient chooses bed at      Patient to be transferred to [facility] on  .  Patient to be  transferred to facility by       Patient family notified on   of transfer.  Name of family member notified:        PHYSICIAN       Additional Comment:    _______________________________________________ Standley Brooking, LCSW 12/07/2016, 2:01 PM

## 2016-12-08 ENCOUNTER — Inpatient Hospital Stay (HOSPITAL_COMMUNITY): Payer: Medicare Other

## 2016-12-08 LAB — BASIC METABOLIC PANEL
ANION GAP: 8 (ref 5–15)
BUN: 14 mg/dL (ref 6–20)
CALCIUM: 8.5 mg/dL — AB (ref 8.9–10.3)
CO2: 26 mmol/L (ref 22–32)
Chloride: 109 mmol/L (ref 101–111)
Creatinine, Ser: 0.55 mg/dL (ref 0.44–1.00)
GFR calc Af Amer: >60 mL/min (ref >60)
GLUCOSE: 77 mg/dL (ref 65–99)
POTASSIUM: 3.8 mmol/L (ref 3.5–5.1)
SODIUM: 143 mmol/L (ref 135–145)

## 2016-12-08 LAB — CBC
HCT: 24.3 % — ABNORMAL LOW (ref 36.0–46.0)
Hemoglobin: 7.9 g/dL — ABNORMAL LOW (ref 12.0–15.0)
MCH: 27 pg (ref 26.0–34.0)
MCHC: 32.5 g/dL (ref 30.0–36.0)
MCV: 82.9 fL (ref 78.0–100.0)
PLATELETS: 374 10*3/uL (ref 150–400)
RBC: 2.93 MIL/uL — AB (ref 3.87–5.11)
RDW: 18.3 % — AB (ref 11.5–15.5)
WBC: 11 10*3/uL — AB (ref 4.0–10.5)

## 2016-12-08 LAB — GLUCOSE, CAPILLARY: GLUCOSE-CAPILLARY: 71 mg/dL (ref 65–99)

## 2016-12-08 LAB — POTASSIUM: POTASSIUM: 3.3 mmol/L — AB (ref 3.5–5.1)

## 2016-12-08 LAB — C DIFFICILE QUICK SCREEN W PCR REFLEX
C DIFFICILE (CDIFF) INTERP: NOT DETECTED
C Diff antigen: NEGATIVE
C Diff toxin: NEGATIVE

## 2016-12-08 LAB — MAGNESIUM: MAGNESIUM: 1.6 mg/dL — AB (ref 1.7–2.4)

## 2016-12-08 MED ORDER — CLONIDINE HCL 0.1 MG PO TABS
0.1000 mg | ORAL_TABLET | Freq: Every day | ORAL | Status: DC
  Administered 2016-12-09: 0.1 mg via ORAL
  Filled 2016-12-08: qty 1

## 2016-12-08 MED ORDER — AMLODIPINE BESYLATE 10 MG PO TABS: 10.0000 mg | ORAL_TABLET | Freq: Every day | ORAL | 0 refills | Status: DC

## 2016-12-08 MED ORDER — AMLODIPINE BESYLATE 10 MG PO TABS
10.0000 mg | ORAL_TABLET | Freq: Every day | ORAL | Status: DC
  Administered 2016-12-09: 10 mg via ORAL
  Filled 2016-12-08: qty 1

## 2016-12-08 MED ORDER — CHLORTHALIDONE 25 MG PO TABS: 50.0000 mg | ORAL_TABLET | Freq: Every day | ORAL | 0 refills | Status: DC

## 2016-12-08 MED ORDER — CARVEDILOL 12.5 MG PO TABS
12.5000 mg | ORAL_TABLET | Freq: Two times a day (BID) | ORAL | Status: DC
  Administered 2016-12-08 – 2016-12-09 (×2): 12.5 mg via ORAL
  Filled 2016-12-08 (×2): qty 1

## 2016-12-08 MED ORDER — CHLORTHALIDONE 50 MG PO TABS
50.0000 mg | ORAL_TABLET | Freq: Every day | ORAL | Status: DC
  Administered 2016-12-09: 50 mg via ORAL
  Filled 2016-12-08: qty 1

## 2016-12-08 MED ORDER — SACCHAROMYCES BOULARDII 250 MG PO CAPS
250.0000 mg | ORAL_CAPSULE | Freq: Two times a day (BID) | ORAL | Status: DC
  Administered 2016-12-08 – 2016-12-09 (×3): 250 mg via ORAL
  Filled 2016-12-08 (×3): qty 1

## 2016-12-08 MED ORDER — CARVEDILOL 12.5 MG PO TABS: 12.5000 mg | ORAL_TABLET | Freq: Two times a day (BID) | ORAL | 0 refills | Status: AC

## 2016-12-08 MED ORDER — AMLODIPINE BESYLATE 5 MG PO TABS
5.0000 mg | ORAL_TABLET | Freq: Every day | ORAL | Status: DC
  Administered 2016-12-08: 5 mg via ORAL
  Filled 2016-12-08: qty 1

## 2016-12-08 NOTE — Progress Notes (Signed)
Patient had 20 beats of Vtach. Patient was asymptomatic and denied any chest pain or dizziness and did not appear to be SOB at that time. Patient was sitting up in the chair eating a cracker. BP was 181/68, HR 79, O2 sats 96% on RA. Hospitalist MD made aware of findings. No new orders received at this time as MD has already made several changes to BP medications today. MD states she will contact Surgery team to make them aware of events and expects that patient will need to stay at least overnight for further monitoring. Will continue to monitor patient.  Othella Boyer [facility] 12/08/2016

## 2016-12-08 NOTE — Consult Note (Signed)
[facility] Nurse wound consult note Reason for Consult:Moisture Associated Skin Damage (MASD)  to bilateral buttocks at the gluteal fold.  Has had recent frequent watery stools and prolonged hospital stay.  Since 10/28/16.  Up to bedside commode now for toileting but was using bed pan for some time.   Wound type:MASD to buttocks Pressure Injury POA: N/A Measurement:4 cm x 2 cm intact erythema to bilateral buttocks at gluteal fold.  Tender to touch per patient.  Staff were using barrier cream per protocol.  Will implement silicone border foam dressing to wick away moisture.  Encouraged patient to turn and offload pressure in bed .  Is currently up to chair.  Wound [id number] nonblanchable erythema Drainage (amount, consistency, odor) none Periwound:intact Dressing procedure/placement/frequency:Cleanse buttocks daily and PRN soilage with soap and water.  Silicone border foam dressing to buttocks to wick away moisture and improve moisture associated skin damage.  Will not follow at this time.  Please re-consult if needed.  Domenic Moras RN BSN [place] Pager [phone]

## 2016-12-08 NOTE — Clinical Social Work Placement (Signed)
CSW confirmed with Florentina Jenny at [facility] that they would be able to take patient over the weekend if ready.   Please call weekend CSW (ph#: [phone]) to facilitate discharge.      Raynaldo Opitz, [facility] Clinical Social Worker cell #: [phone]   CLINICAL SOCIAL WORK PLACEMENT  NOTE  Date:  12/08/2016  Patient Details  Name: Melanie [name] MRN: [id number] Date of Birth: [birth year]  Clinical Social Work is seeking post-discharge placement for this patient at the [place] level of care (*CSW will initial, date and re-position this form in  chart as items are completed):  Yes   Patient/family provided with [facility] Work Department's list of facilities offering this level of care within the geographic area requested by the patient (or if unable, by the patient's family).  Yes   Patient/family informed of their freedom to choose among providers that offer the needed level of care, that participate in Medicare, Medicaid or managed care program needed by the patient, have an available bed and are willing to accept the patient.  Yes   Patient/family informed of [facility]'s ownership interest in [facility] and [facility], as well as of the fact that they are under no obligation to receive care at these facilities.  PASRR submitted to EDS on       PASRR number received on       Existing PASRR number confirmed on 12/03/16     FL2 transmitted to all facilities in geographic area requested by pt/family on 12/05/16     FL2 transmitted to all facilities within larger geographic area on 12/05/16     Patient informed that his/her managed care company has contracts with or will negotiate with certain facilities, including the following:        Yes   Patient/family informed of bed offers received.  Patient chooses bed at [facility]     Physician recommends and patient chooses bed at      Patient to be  transferred to [facility] on  .  Patient to be transferred to facility by       Patient family notified on   of transfer.  Name of family member notified:        PHYSICIAN       Additional Comment:    _______________________________________________ Standley Brooking, LCSW 12/08/2016, 3:40 PM

## 2016-12-08 NOTE — Progress Notes (Signed)
PT Cancellation Note  Patient Details Name: Melanie [name] MRN: [id number] DOB: 1935/10/19   Cancelled Treatment:    Reason Eval/Treat Not Completed: Fatigue/lethargy limiting ability to participate (patient asleep, per family, did not rest last night.)   [name] 12/08/2016, 4:50 PM

## 2016-12-08 NOTE — Progress Notes (Signed)
14 Days Post-Op  Subjective: No acute issues. Having watery bowel movements, 3 times so far today.   Objective: Vital signs in last 24 hours: Temp:  [98.4 F (36.9 C)-98.7 F (37.1 C)] 98.4 F (36.9 C) (03/02 0501) Pulse Rate:  [73-82] 81 (03/02 0924) Resp:  [17-22] 22 (03/02 0924) BP: (139-171)/(44-65) 171/57 (03/02 0924) SpO2:  [94 %-100 %] 95 % (03/02 0924) Weight:  [85.6 kg (188 lb 11.4 oz)] 85.6 kg (188 lb 11.4 oz) (03/02 0500) Last BM Date: 12/08/16 PO not recorded 1700 IV Voided x 4 recorded BM x 3 reported by bedside RN Afebrile, WBC 11 from 9.4, BP still up but stable Anemia stable Renal ultrasound:  Study was technically difficult and limited due to overlying bowel gas.  Findings suggest >60% right renal artery stenosis and 1-59% left renal artery stenosis. Other specific details can be found in the table(s) above. Intake/Output from previous day: No intake/output data recorded. Intake/Output this shift: No intake/output data recorded.  General appearance: alert, cooperative and no distress Resp: clear to auscultation bilaterally Cardio: S1: normal and Rate and rhythm. GI: Soft, sore, staples intact, no distention she did not have any significant discomfort positive bowel sounds, positive BM.    Lab Results:   Recent Labs  12/07/16 1044 12/08/16 0447  WBC 9.4 11.0*  HGB 7.7* 7.9*  HCT 23.8* 24.3*  PLT 378 374    BMET  Recent Labs  12/07/16 0511 12/08/16 0447  NA 141 143  K 4.0 3.8  CL 106 109  CO2 28 26  GLUCOSE 98 77  BUN 17 14  CREATININE 0.49 0.55  CALCIUM 8.2* 8.5*   PT/INR No results for input(s): LABPROT, INR in the last 72 hours.   Recent Labs Lab 12/04/16 0441 12/07/16 0511  AST 23 23  ALT 29 24  ALKPHOS 179* 166*  BILITOT 0.5 0.7  PROT 5.8* 6.5  ALBUMIN 1.8* 2.1*     Lipase     Component Value Date/Time   LIPASE 13 11/11/2016 0945     Studies/Results: Dg Chest 1 View  Result Date: 12/08/2016 CLINICAL DATA:   Dyspnea, left side chest pain EXAM: CHEST 1 VIEW COMPARISON:  12/02/2016 FINDINGS: Cardiomegaly again noted. Stable right PICC line position with tip in distal SVC. No pulmonary edema. Emphysematous changes bilaterally again noted. Small bilateral pleural effusion with bilateral basilar atelectasis. IMPRESSION: Stable right PICC line position with tip in distal SVC. No pulmonary edema. Emphysematous changes bilaterally again noted. Small bilateral pleural effusion with bilateral basilar atelectasis. Electronically Signed   By: Lahoma Crocker M.D.   On: 12/08/2016 09:32    Medications: . acetaminophen  1,000 mg Oral Q6H  . amLODipine  5 mg Oral Daily  . chlorthalidone  25 mg Oral Daily  . cloNIDine  0.2 mg Transdermal Weekly  . enoxaparin (LOVENOX) injection  40 mg Subcutaneous Q24H  . famotidine  20 mg Oral QHS  . feeding supplement (PRO-STAT SUGAR FREE 64)  30 mL Oral BID  . hydrALAZINE  50 mg Oral Q6H  . lip balm  1 application Topical BID  . losartan  50 mg Oral Daily  . metoprolol tartrate  50 mg Oral BID  . mirtazapine  7.5 mg Oral QHS  . multivitamins with iron  1 tablet Oral Daily    Assessment/Plan Admitted 1/20 - Medicine S/p Laparoscopic Cholecystectomy with intraoperative cholangiogram, 10/30/16, Dr. Rodman Key Martin;Necrotic gallbladder with free intrapertioneal bile; enlarged CBD with free flow into the duodenum POD 37 Exploratory laparotomy  with LOA - findings: Mechanical obstruction, 11/17/16, Dr. Dalbert Batman POD 21 Exploratory laparotomy, LOA, reduction of SB volvulus 2/16- Dr. Zella Richer, POD 14 Swelling right arm - Doppler is negative for DVT Hypokalemia- being replaced Chest pain with elevated troponin -improved.  Echo EF 0000000 1 diastolic dysfunction/Trivial MR- continue prn nitro Anemia Stable post transfusion. 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 - on chronic antibiotics. Check c. Diff today History MRSA - On isolation FEN: IV fluids, TNA/starting soft diet ID: Zosyn 1/21- 11/16/16, on again 2/15-2/17.  No abscess at time of surgery. Currently no antibitotics. WBC improved DVT prophylaxis: Lovenox   Plan:  Medicine is adjusting her blood pressure medications. If her blood pressure is well controlled with the current modification Dr.  Clifton James, think she can go to skilled nursing facility. She is tolerating a diet. Her diarrhea seems a bit worse today- check c diff. The stage I decubitus can be treated locally; continue to reinforce nutrition and mobilization She remains profoundly weak, and clearly needs skilled nursing facility and rehabilitation care.        LOS: 41 days    Clovis Riley MD 12/08/2016

## 2016-12-08 NOTE — Progress Notes (Addendum)
PROGRESS NOTE    Melanie [name]  [id number] DOB: Mar 08, 1936 DOA: 10/28/2016 PCP: Osborne Casco, MD     Brief Narrative:  Melanie [name] is a 81 y.o. female with prolonged hospitalization, admitted on 01/20 for necrotic cholecystitis. She developed post op bowel obstruction, re-intervened on 02/09 with exploratory laparotomy. NG tube in place, on TPN. TRH was consulted for chest pain that ruled out for ACS. Patient has been in persistent pain, on PCA pump. Patient had third surgical intervention, emergent laparatomy, recurrent bowel obstruction with volvulus with ischemic segment. Resumed clonidine for blood pressure control. Worsening hypertension, started on nicardipine drip.Clonidine dose increase to 0.2 mg, patient now off nicardipine and she has been managed on oral antihypertensives. Renal doppler revealed right unilateral renal artery stenosis > 60%.   Assessment & Plan:   Principal Problem:   Small bowel obstruction s/p ex lap & lysis of adhesions 11/17/2016 Active Problems:   Necrotic cholecystitis s/p lap cholecystectomy 10/30/2016   Essential hypertension   Hyperlipidemia   COPD (chronic obstructive pulmonary disease) (HCC)   GERD (gastroesophageal reflux disease)   Chest pain   H/O exploratory laparotomy   Anxiousness   Pressure injury of skin  Necrotic cholecystitis Post-op bowel obstruction as well as small bowel volvulus  -S/p lap chole 1/22, s/p ex lap with LOA 2/9, s/p ex lap with LOA and reduction of small bowel volvulus 2/16  -Management per primary team (surgery) -Now off TPN, tolerating diet, loose brown stools this AM   Atypical Chest Pain -Resolved  V-tach -Patient had asymptomatic episode of non-sustained v-tach. Currently normal sinus with PACs   Dyspnea  -Stat CXR this morning. Short of breath overnight, [place] O2 started although she was satting 100% on room air, crackles bilaterally. CXR revealed small bilateral pleural effusions, atelectasis, no  pulmonary edema. Patient weaned to room air and satting 95%. Less concerned for PE without hypoxemia or tachycardia. Continue incentive spirometry, flutter valve.   Hypertension secondary to unilateral RAS  -Renal artery duplex completed 2/28 with right renal artery stenosis > 60%. Revascularization is only indicated for short duration of elevated BP, failure of optimal medical therapy or intolerance to therapy, or recurrent flash pulmonary edema or refractory heart failure. Patient tells me that she has had uncontrolled HTN for 30 years and has been on many different antihypertensives, most recently on 4-drug regimen.  -Will continue to maximize medical therapy for now as below  -Unable to tolerate ACE due to cough. Losartan 50mg  daily, hydralazine 50mg  q6h, coreg 12.5mg  BID, clonidine 0.1mg  daily, chlorthalidone 50mg  daily, amlodipine 10mg  daily. Continue to monitor as outpatient as she recovers from her acute illnesses and deconditioning at SNF and adjust meds and dosage as necessary. She should follow up with her PCP with regular BMP checks. If her blood pressure continues to be uncontrolled, would be reasonable to refer to Nephrology as outpatient for secondary hypertension treatment. Please note that renin/aldosterone level is pending at this time  -Has seen Dr. Donnetta Hutching with vascular surgery in Feb 2017 regarding asymptomatic carotid stenosis. He recommended follow up in 1 year at that time. I would recommend that she also follow up with him once stable from acute illness. This was re-iterated to patient this morning as well.  -Make sure blood pressure checks are on left arm for accuracy as she has right subclavian stenosis  COPD -Stable  -Duoneb QID prn  Anemia -Stable. Transfuse for Hgb < 7  Chronic diastolic heart failure -Grade 2 diastolic heart failure with  EF of 60-65%   DVT prophylaxis: lovenox Code Status: Full Family Communication: no family at bedside this morning Disposition  Plan: per surgical team, SNF planned    Procedures:   Laparoscopic cholecystectomy 10/30/16  Exploratory laparotomy with LOA 11/17/16  ECHO 2/12- EF 60-65%, G1DD.  Exploratory laparotomy 11/24/16  Antimicrobials:   Zosyn (1/20-1/24, 1/27-2/9, 2/15-2/18)   Subjective: States she was SOB overnight and could not get any rest. [place] O2 was applied although she was satting 100% on room air at the time. She denies any cough or chest pain at this time    Objective: Vitals:   12/07/16 1856 12/07/16 2044 12/08/16 0500 12/08/16 0501  BP: (!) 164/65 (!) 140/44  (!) 164/55  Pulse: 82 77  79  Resp: 20 20  20   Temp:  98.7 F (37.1 C)  98.4 F (36.9 C)  TempSrc:  Oral  Oral  SpO2: 96% 96%  100%  Weight:   85.6 kg (188 lb 11.4 oz)   Height:       No intake or output data in the 24 hours ending 12/08/16 0923 Filed Weights   12/06/16 0527 12/07/16 0558 12/08/16 0500  Weight: 87.1 kg (192 lb 0.3 oz) 87.6 kg (193 lb 2 oz) 85.6 kg (188 lb 11.4 oz)    Examination:  General exam: Appears calm and comfortable  Respiratory system: Crackles bilateral bases. Respiratory effort normal. No conversational dyspnea, no accessory muscle use  Cardiovascular system: S1 & S2 heard, RRR. No JVD, murmurs, rubs, gallops or clicks. +1 pedal edema. Gastrointestinal system: Abdomen is nondistended, soft and not TTP, midline incision is clean and dry, staples in tact. No organomegaly or masses felt. Normal bowel sounds heard. Central nervous system: Alert and oriented. No focal neurological deficits. Extremities: Symmetric 5 x 5 power. Warm to touch, shiny shins bilaterally  Skin: No rashes, lesions or ulcers on exposed skin, reported sacral/buttock wound  Psychiatry: Judgement and insight appear normal. Mood & affect appropriate.   Data Reviewed: I have personally reviewed following labs and imaging studies  CBC:  Recent Labs Lab 12/02/16 0522 12/04/16 0441 12/06/16 0036 12/07/16 1044 12/08/16 0447    WBC 13.1* 11.2* 9.6 9.4 11.0*  NEUTROABS  --  8.2*  --  5.4  --   HGB 8.0* 7.7* 7.3* 7.7* 7.9*  HCT 24.1* 23.2* 22.6* 23.8* 24.3*  MCV 80.6 80.8 81.6 82.4 82.9  PLT 393 375 365 378 [id number]   Basic Metabolic Panel:  Recent Labs Lab 12/04/16 0441 12/04/16 2009 12/05/16 0401 12/06/16 0500 12/07/16 0511 12/08/16 0447  NA 138  --  141 142 141 143  K 2.7* 3.0* 3.1* 3.0* 4.0 3.8  CL 101  --  103 105 106 109  CO2 30  --  30 30 28 26   GLUCOSE 132*  --  124* 108* 98 77  BUN 17  --  20 18 17 14   CREATININE 0.45  --  0.45 0.43* 0.49 0.55  CALCIUM 8.2*  --  8.3* 8.2* 8.2* 8.5*  MG 1.5* 2.3 2.1 2.0 2.0  --   PHOS 3.5  --   --   --  3.2  --    GFR: Estimated Creatinine Clearance: 58.8 mL/min (by C-G formula based on SCr of 0.55 mg/dL). Liver Function Tests:  Recent Labs Lab 12/04/16 0441 12/07/16 0511  AST 23 23  ALT 29 24  ALKPHOS 179* 166*  BILITOT 0.5 0.7  PROT 5.8* 6.5  ALBUMIN 1.8* 2.1*   No results for input(s):  LIPASE, AMYLASE in the last 168 hours. No results for input(s): AMMONIA in the last 168 hours. Coagulation Profile: No results for input(s): INR, PROTIME in the last 168 hours. Cardiac Enzymes: No results for input(s): CKTOTAL, CKMB, CKMBINDEX, TROPONINI in the last 168 hours. BNP (last 3 results) No results for input(s): PROBNP in the last 8760 hours. HbA1C: No results for input(s): HGBA1C in the last 72 hours. CBG:  Recent Labs Lab 12/06/16 1734 12/07/16 0733 12/07/16 1125 12/07/16 1657 12/08/16 0737  GLUCAP 112* 115* 118* 88 71   Lipid Profile: No results for input(s): CHOL, HDL, LDLCALC, TRIG, CHOLHDL, LDLDIRECT in the last 72 hours. Thyroid Function Tests: No results for input(s): TSH, T4TOTAL, FREET4, T3FREE, THYROIDAB in the last 72 hours. Anemia Panel: No results for input(s): VITAMINB12, FOLATE, FERRITIN, TIBC, IRON, RETICCTPCT in the last 72 hours. Sepsis Labs: No results for input(s): PROCALCITON, LATICACIDVEN in the last 168  hours.  Recent Results (from the past 240 hour(s))  MRSA PCR Screening     Status: Abnormal   Collection Time: 11/28/16 12:40 PM  Result Value Ref Range Status   MRSA by PCR POSITIVE (A) NEGATIVE Final    Comment:        The GeneXpert MRSA Assay (FDA approved for NASAL specimens only), is one component of a comprehensive MRSA colonization surveillance program. It is not intended to diagnose MRSA infection nor to guide or monitor treatment for MRSA infections. RESULT CALLED TO, READ BACK BY AND VERIFIED WITH: F.AKAND RN AT [id number] ON 11/28/16 BY S.VANHOORNE        Radiology Studies: Dg Abd 1 View  Result Date: 12/06/2016 CLINICAL DATA:  Mid abdominal pain. Recent recurrent small bowel obstruction, with surgery on 11/24/2016. EXAM: ABDOMEN - 1 VIEW COMPARISON:  11/27/2016 FINDINGS: There are no dilated loops of large or small bowel. No visible free air or free fluid on these supine radiographs. Surgical staples are seen in the midline. No acute bone abnormality. NG tube has been removed. IMPRESSION: No residual dilated small bowel loops.  Benign-appearing abdomen. Electronically Signed   By: Lorriane Shire M.D.   On: 12/06/2016 11:02      Scheduled Meds: . acetaminophen  1,000 mg Oral Q6H  . chlorthalidone  25 mg Oral Daily  . cloNIDine  0.2 mg Transdermal Weekly  . enoxaparin (LOVENOX) injection  40 mg Subcutaneous Q24H  . famotidine  20 mg Oral QHS  . feeding supplement (PRO-STAT SUGAR FREE 64)  30 mL Oral BID  . hydrALAZINE  50 mg Oral Q6H  . lip balm  1 application Topical BID  . losartan  50 mg Oral Daily  . metoprolol tartrate  50 mg Oral BID  . mirtazapine  7.5 mg Oral QHS  . multivitamins with iron  1 tablet Oral Daily   Continuous Infusions:    LOS: 41 days    Time spent: 30 minutes   Dessa Phi, DO [facility] [email or web address] Password [facility] 12/08/2016, 9:23 AM

## 2016-12-09 LAB — CBC
HCT: 23.4 % — ABNORMAL LOW (ref 36.0–46.0)
HEMOGLOBIN: 7.6 g/dL — AB (ref 12.0–15.0)
MCH: 27.2 pg (ref 26.0–34.0)
MCHC: 32.5 g/dL (ref 30.0–36.0)
MCV: 83.9 fL (ref 78.0–100.0)
Platelets: 358 10*3/uL (ref 150–400)
RBC: 2.79 MIL/uL — AB (ref 3.87–5.11)
RDW: 18.7 % — ABNORMAL HIGH (ref 11.5–15.5)
WBC: 11.3 10*3/uL — ABNORMAL HIGH (ref 4.0–10.5)

## 2016-12-09 LAB — BASIC METABOLIC PANEL
ANION GAP: 8 (ref 5–15)
BUN: 11 mg/dL (ref 6–20)
CHLORIDE: 107 mmol/L (ref 101–111)
CO2: 25 mmol/L (ref 22–32)
Calcium: 8.3 mg/dL — ABNORMAL LOW (ref 8.9–10.3)
Creatinine, Ser: 0.42 mg/dL — ABNORMAL LOW (ref 0.44–1.00)
GFR calc Af Amer: >60 mL/min (ref >60)
GFR calc non Af Amer: >60 mL/min (ref >60)
Glucose, Bld: 79 mg/dL (ref 65–99)
POTASSIUM: 3.5 mmol/L (ref 3.5–5.1)
Sodium: 140 mmol/L (ref 135–145)

## 2016-12-09 NOTE — Progress Notes (Signed)
[name], NP on call made aware of small amount of blood seen in emesis. Will continue to monitor pt closely Melanie [name] I

## 2016-12-09 NOTE — Progress Notes (Signed)
  Physical Therapy Treatment Patient Details Name: Melanie [name] MRN: [id number] DOB: 02-28-1936 Today's Date: 12/09/2016    History of Present Illness 81 y.o. female with medical history significant of gallstones presents to the hospital with the chief complaint of abdominal pain. s/p laparoscopic cholecystectomy 10/30/16. Post op ileus, Exploratory laparotomy with LOA - findings:  Mechanical obstruction, 11/17/16, Dr. Dalbert Batman; Patient had third surgical intervention, emergent laparatomy d/t recurrent bowel obstruction with volvulus with ischemic segment, 2/16    PT Comments    Pt agreeable to get up to [facility] however she refused ambulation. Continue to recommend SNF.    Follow Up Recommendations  SNF     Equipment Recommendations  None recommended by PT    Recommendations for Other Services       Precautions / Restrictions Precautions Precautions: Fall Restrictions Weight Bearing Restrictions: No    Mobility  Bed Mobility Overal bed mobility: Needs Assistance Bed Mobility: Supine to Sit     Supine to sit: HOB elevated;Min guard     General bed mobility comments: for safety. Increased time.   Transfers Overall transfer level: Needs assistance Equipment used: Rolling walker (2 wheeled) Transfers: Sit to/from Stand Sit to Stand: Min assist Stand pivot transfers: Min assist       General transfer comment: Assist to rise, stabilize, control descent. Stand pivot x2 with RW, bed<>bsc. Increased time.   Ambulation/Gait             General Gait Details: Pt refused ambulation   Stairs            Wheelchair Mobility    Modified Rankin (Stroke Patients Only)       Balance                                    Cognition Arousal/Alertness: Awake/alert Behavior During Therapy: Flat affect Overall Cognitive Status: Within Functional Limits for tasks assessed                      Exercises      General Comments        Pertinent  Vitals/Pain Pain Assessment: Faces Faces Pain Scale: Hurts a little bit Pain Location: abdomen Pain Intervention(s): Monitored during session    Home Living                      Prior Function            PT Goals (current goals can now be found in the care plan section) Progress towards PT goals: Progressing toward goals (very slowly)    Frequency           PT Plan Current plan remains appropriate    Co-evaluation             End of Session   Activity Tolerance: Patient limited by fatigue Patient left: in bed;with call bell/phone within reach;with bed alarm set (sitting at EOB)   PT Visit Diagnosis: Muscle weakness (generalized) (M62.81);Difficulty in walking, not elsewhere classified (R26.2)     Time: 1001-1013 PT Time Calculation (min) (ACUTE ONLY): 12 min  Charges:  $Therapeutic Activity: 8-22 mins                    G Codes:       Weston Anna, MPT Pager: [phone]

## 2016-12-09 NOTE — Clinical Social Work Placement (Signed)
   CLINICAL SOCIAL WORK PLACEMENT  NOTE 12/09/16 - DISCHARGED TO [facility]IA AMBULANCE  Date:  12/09/2016  Patient Details  Name: Melanie [name] MRN: [id number] Date of Birth: [birth year]  Clinical Social Work is seeking post-discharge placement for this patient at the [place] level of care (*CSW will initial, date and re-position this form in  chart as items are completed):  Yes   Patient/family provided with [place] Work Department's list of facilities offering this level of care within the geographic area requested by the patient (or if unable, by the patient's family).  Yes   Patient/family informed of their freedom to choose among providers that offer the needed level of care, that participate in Medicare, Medicaid or managed care program needed by the patient, have an available bed and are willing to accept the patient.  Yes   Patient/family informed of [facility]'s ownership interest in [facility] and [facility], as well as of the fact that they are under no obligation to receive care at these facilities.  PASRR submitted to EDS on       PASRR number received on       Existing PASRR number confirmed on 12/03/16     FL2 transmitted to all facilities in geographic area requested by pt/family on 12/05/16     FL2 transmitted to all facilities within larger geographic area on 12/05/16     Patient informed that his/her managed care company has contracts with or will negotiate with certain facilities, including the following:        Yes   Patient/family informed of bed offers received.  Patient chooses bed at [facility]     Physician recommends and patient chooses bed at      Patient to be transferred to [facility] on  12/09/16.  Patient to be transferred to facility by  ambulance.     Patient family notified on  12/09/16 of transfer.  Name of family member notified:   Daughter Shari Prows by phone and at the bedside.       PHYSICIAN       Additional Comment:    _______________________________________________ Sable Feil, LCSW 12/09/2016, 1:13 PM

## 2016-12-09 NOTE — Progress Notes (Signed)
Assessment S/p Laparoscopic Cholecystectomy with intraoperative cholangiogram, 10/30/16, Dr. Rodman Key Martin;Necrotic gallbladder with free intrapertioneal bile; enlarged CBD with free flow into the duodenum POD 37 Exploratory laparotomy with LOA - findings: Mechanical obstruction, 11/17/16, Dr. Dalbert Batman POD 21 Exploratory laparotomy, LOA, reduction of SB volvulus 2/16- Dr. Zella Richer, POD 14-having bowel functioni Swelling right arm - Doppler is negative for DVT Hypokalemia- being replaced CV: Chest pain with elevated troponin -improved.  Echo EF 0000000 1 diastolic dysfunction/Trivial MR- continue prn nitro              Has intermittent PVCs-chronic.IM feels it is okay for discharge to SNF today. Anemia Stable post transfusion. Hgb - 8.6 - 11/30/2016  =>> 7.7  12/07/16 -> 7.9 today Severe protein calorie malnutrition -prealbumin 6.0 - 11/27/2016 stopping TNA =>> 10.2  -   12/03/16 HTN -being adjusted by Medicine Right renal artery stenosis > 60%   Left renal 1-59%           COPD-  IS GERD- protonix History infected knee after TKA. - on chronic antibiotics.today History MRSA - On isolation FEN: Tolerating soft diet. ID:  C.Diff negative 12/08/16 DVT prophylaxis: Lovenox   Deconditioned state  Plan:  Remove staples from abdominal wound. SNF today.     LOS: 42 days     15 Days Post-Op  Subjective: She remains weak but is stable.  IM feels she is stable to go to SNF today.  Objective: Vital signs in last 24 hours: Temp:  [98.1 F (36.7 C)-98.7 F (37.1 C)] 98.7 F (37.1 C) (03/03 0418) Pulse Rate:  [76-86] 86 (03/03 0418) Resp:  [16-18] 18 (03/03 0418) BP: (124-183)/(41-68) 183/59 (03/03 0418) SpO2:  [96 %] 96 % (03/02 2007) Weight:  [82.4 kg (181 lb 10.5 oz)] 82.4 kg (181 lb 10.5 oz) (03/03 0700) Last BM Date: 12/09/16  Intake/Output from previous day: 03/02 0701 - 03/03 0700 In: 200 [P.O.:200] Out: 850  [Urine:850] Intake/Output this shift: No intake/output data recorded.  PE: General- In NAD Abdomen-soft, incision is clean and intact  Lab Results:   Recent Labs  12/08/16 0447 12/09/16 0416  WBC 11.0* 11.3*  HGB 7.9* 7.6*  HCT 24.3* 23.4*  PLT 374 358   BMET  Recent Labs  12/08/16 0447 12/08/16 1816 12/09/16 0416  NA 143  --  140  K 3.8 3.3* 3.5  CL 109  --  107  CO2 26  --  25  GLUCOSE 77  --  79  BUN 14  --  11  CREATININE 0.55  --  0.42*  CALCIUM 8.5*  --  8.3*   PT/INR No results for input(s): LABPROT, INR in the last 72 hours. Comprehensive Metabolic Panel:    Component Value Date/Time   NA 140 12/09/2016 0416   NA 143 12/08/2016 0447   K 3.5 12/09/2016 0416   K 3.3 (L) 12/08/2016 1816   CL 107 12/09/2016 0416   CL 109 12/08/2016 0447   CO2 25 12/09/2016 0416   CO2 26 12/08/2016 0447   BUN 11 12/09/2016 0416   BUN 14 12/08/2016 0447   CREATININE 0.42 (L) 12/09/2016 0416   CREATININE 0.55 12/08/2016 0447   GLUCOSE 79 12/09/2016 0416   GLUCOSE 77 12/08/2016 0447   CALCIUM 8.3 (L) 12/09/2016 0416   CALCIUM 8.5 (L) 12/08/2016 0447   AST 23 12/07/2016 0511   AST 23 12/04/2016 0441   ALT 24 12/07/2016 0511   ALT 29 12/04/2016 0441   ALKPHOS 166 (H) 12/07/2016 [id number]  ALKPHOS 179 (H) 12/04/2016 0441   BILITOT 0.7 12/07/2016 0511   BILITOT 0.5 12/04/2016 0441   PROT 6.5 12/07/2016 0511   PROT 5.8 (L) 12/04/2016 0441   ALBUMIN 2.1 (L) 12/07/2016 0511   ALBUMIN 1.8 (L) 12/04/2016 0441     Studies/Results: Dg Chest 1 View  Result Date: 12/08/2016 CLINICAL DATA:  Dyspnea, left side chest pain EXAM: CHEST 1 VIEW COMPARISON:  12/02/2016 FINDINGS: Cardiomegaly again noted. Stable right PICC line position with tip in distal SVC. No pulmonary edema. Emphysematous changes bilaterally again noted. Small bilateral pleural effusion with bilateral basilar atelectasis. IMPRESSION: Stable right PICC line position with tip in distal SVC. No pulmonary edema.  Emphysematous changes bilaterally again noted. Small bilateral pleural effusion with bilateral basilar atelectasis. Electronically Signed   By: Lahoma Crocker M.D.   On: 12/08/2016 09:32    Anti-infectives: Anti-infectives    Start     Dose/Rate Route Frequency Ordered Stop   11/23/16 1800  piperacillin-tazobactam (ZOSYN) IVPB 3.375 g  Status:  Discontinued     3.375 g 12.5 mL/hr over 240 Minutes Intravenous Every 8 hours 11/23/16 1753 11/26/16 0839   11/17/16 0730  cefoTEtan (CEFOTAN) 2 g in dextrose 5 % 50 mL IVPB     2 g 100 mL/hr over 30 Minutes Intravenous On call to O.R. 11/17/16 0717 11/17/16 1040   11/04/16 0900  piperacillin-tazobactam (ZOSYN) IVPB 3.375 g  Status:  Discontinued     3.375 g 12.5 mL/hr over 240 Minutes Intravenous Every 8 hours 11/04/16 0833 11/17/16 0631   10/30/16 1600  piperacillin-tazobactam (ZOSYN) IVPB 3.375 g  Status:  Discontinued     3.375 g 12.5 mL/hr over 240 Minutes Intravenous Every 8 hours 10/30/16 1539 11/01/16 1814   10/29/16 1200  piperacillin-tazobactam (ZOSYN) IVPB 3.375 g  Status:  Discontinued     3.375 g 12.5 mL/hr over 240 Minutes Intravenous Every 8 hours 10/29/16 0941 10/30/16 1539       Deandrew Hoecker J 12/09/2016

## 2016-12-09 NOTE — Progress Notes (Signed)
PROGRESS NOTE    Melanie [name]  [id number] DOB: 12/09/1935 DOA: 10/28/2016 PCP: Osborne Casco, MD     Brief Narrative:  Melanie [name] is a 81 y.o. female with prolonged hospitalization, admitted on 01/20 for necrotic cholecystitis. She developed post op bowel obstruction, re-intervened on 02/09 with exploratory laparotomy. NG tube in place, on TPN. TRH was consulted for chest pain that ruled out for ACS. Patient has been in persistent pain, on PCA pump. Patient had third surgical intervention, emergent laparatomy, recurrent bowel obstruction with volvulus with ischemic segment. Resumed clonidine for blood pressure control. Worsening hypertension, started on nicardipine drip.Clonidine dose increase to 0.2 mg, patient now off nicardipine and she has been managed on oral antihypertensives. Renal doppler revealed right unilateral renal artery stenosis > 60%.   Assessment & Plan:   Principal Problem:   Small bowel obstruction s/p ex lap & lysis of adhesions 11/17/2016 Active Problems:   Necrotic cholecystitis s/p lap cholecystectomy 10/30/2016   Essential hypertension   Hyperlipidemia   COPD (chronic obstructive pulmonary disease) (HCC)   GERD (gastroesophageal reflux disease)   Chest pain   H/O exploratory laparotomy   Anxiousness   Pressure injury of skin  Necrotic cholecystitis Post-op bowel obstruction as well as small bowel volvulus  -S/p lap chole 1/22, s/p ex lap with LOA 2/9, s/p ex lap with LOA and reduction of small bowel volvulus 2/16  -Management per primary team (surgery) -Now off TPN, tolerating diet, loose brown stools  Atypical Chest Pain -Resolved. Work up unremarkable   V-tach -Patient had asymptomatic episode of non-sustained v-tach. Currently normal sinus with PACs   Dyspnea  -Short of breath overnight 3/1, [place] O2 started although she was satting 100% on room air, crackles bilaterally. CXR revealed small bilateral pleural effusions, atelectasis, no  pulmonary edema. Patient weaned to room air and satting 95%. Less concerned for PE without hypoxemia or tachycardia. Continue incentive spirometry, flutter valve.  -Resolved   Hypertension secondary to unilateral RAS  -Renal artery duplex completed 2/28 with right renal artery stenosis > 60%. Revascularization is only indicated for short duration of elevated BP, failure of optimal medical therapy or intolerance to therapy, or recurrent flash pulmonary edema or refractory heart failure. Patient tells me that she has had uncontrolled HTN for 30 years and has been on many different antihypertensives, most recently on 4-drug regimen.  -Will continue to maximize medical therapy for now as below  -Unable to tolerate ACE due to cough.  DC with:  -Losartan 50mg  daily -Hydralazine 50mg  q6h -Coreg 12.5mg  BID -Clonidine 0.1mg  daily -Chlorthalidone 50mg  daily -Amlodipine 10mg  daily Continue to monitor as outpatient as she recovers from her acute illnesses and deconditioning at SNF and adjust meds and dosage as necessary. She should follow up with her PCP with regular BMP checks. If her blood pressure continues to be uncontrolled, would be reasonable to refer to Nephrology as outpatient for secondary hypertension treatment. Please note that renin/aldosterone level is pending at this time  -Has seen Dr. Donnetta Hutching with vascular surgery in Feb 2017 regarding asymptomatic carotid stenosis. He recommended follow up in 1 year at that time. I would recommend that she also follow up with him once stable from acute illness. This was re-iterated to patient as well.  -Make sure blood pressure checks are on left arm for accuracy as she has right subclavian stenosis  -Ok for discharge to SNF from medical standpoint as long as staff at SNF continues to monitor BP and adjust meds as necessary.  BP overnight as low as 124/41, which is the lowest we have seen in a while   COPD -Stable  -Duoneb QID prn  Anemia -Stable.  Transfuse for Hgb < 7  Chronic diastolic heart failure -Grade 2 diastolic heart failure with EF of 60-65%   DVT prophylaxis: lovenox Code Status: Full Family Communication: no family at bedside this morning Disposition Plan: per surgical team, SNF planned    Procedures:   Laparoscopic cholecystectomy 10/30/16  Exploratory laparotomy with LOA 11/17/16  ECHO 2/12- EF 60-65%, G1DD.  Exploratory laparotomy 11/24/16  Antimicrobials:   Zosyn (1/20-1/24, 1/27-2/9, 2/15-2/18)   Subjective: Feeling better today. Diarrhea has stopped. Breathing is more stable today, some SOB with exertion. Tolerating meals, small amount of emesis overnight but no nausea this morning. No new complaints. Continues to be very weak    Objective: Vitals:   12/08/16 2007 12/08/16 2330 12/09/16 0418 12/09/16 0700  BP: (!) 124/41 (!) 160/48 (!) 183/59   Pulse: 80  86   Resp: 16  18   Temp: 98.1 F (36.7 C)  98.7 F (37.1 C)   TempSrc: Oral  Oral   SpO2: 96%     Weight:    82.4 kg (181 lb 10.5 oz)  Height:        Intake/Output Summary (Last 24 hours) at 12/09/16 1020 Last data filed at 12/09/16 0421  Gross per 24 hour  Intake              200 ml  Output              850 ml  Net             -650 ml   Filed Weights   12/07/16 0558 12/08/16 0500 12/09/16 0700  Weight: 87.6 kg (193 lb 2 oz) 85.6 kg (188 lb 11.4 oz) 82.4 kg (181 lb 10.5 oz)    Examination:  General exam: Appears calm and comfortable  Respiratory system: Crackles bilateral bases. Respiratory effort normal. No conversational dyspnea, no accessory muscle use, on room air  Cardiovascular system: S1 & S2 heard, RRR. No JVD, murmurs, rubs, gallops or clicks. +1 pedal edema. Gastrointestinal system: Abdomen is nondistended, soft and not TTP, midline incision is clean and dry, staples in tact. No organomegaly or masses felt. Normal bowel sounds heard. Central nervous system: Alert and oriented. No focal neurological  deficits. Extremities: Symmetric 5 x 5 power. Warm to touch, shiny shins bilaterally  Skin: No rashes, lesions or ulcers on exposed skin, reported sacral/buttock wound  Psychiatry: Judgement and insight appear normal. Mood & affect appropriate.   Data Reviewed: I have personally reviewed following labs and imaging studies  CBC:  Recent Labs Lab 12/04/16 0441 12/06/16 0036 12/07/16 1044 12/08/16 0447 12/09/16 0416  WBC 11.2* 9.6 9.4 11.0* 11.3*  NEUTROABS 8.2*  --  5.4  --   --   HGB 7.7* 7.3* 7.7* 7.9* 7.6*  HCT 23.2* 22.6* 23.8* 24.3* 23.4*  MCV 80.8 81.6 82.4 82.9 83.9  PLT 375 365 378 [id number]   Basic Metabolic Panel:  Recent Labs Lab 12/04/16 0441 12/04/16 2009 12/05/16 0401 12/06/16 0500 12/07/16 0511 12/08/16 0447 12/08/16 1816 12/09/16 0416  NA 138  --  141 142 141 143  --  140  K 2.7* 3.0* 3.1* 3.0* 4.0 3.8 3.3* 3.5  CL 101  --  103 105 106 109  --  107  CO2 30  --  30 30 28 26   --  25  GLUCOSE  132*  --  124* 108* 98 77  --  79  BUN 17  --  20 18 17 14   --  11  CREATININE 0.45  --  0.45 0.43* 0.49 0.55  --  0.42*  CALCIUM 8.2*  --  8.3* 8.2* 8.2* 8.5*  --  8.3*  MG 1.5* 2.3 2.1 2.0 2.0  --  1.6*  --   PHOS 3.5  --   --   --  3.2  --   --   --    GFR: Estimated Creatinine Clearance: 57.6 mL/min (by C-G formula based on SCr of 0.42 mg/dL (L)). Liver Function Tests:  Recent Labs Lab 12/04/16 0441 12/07/16 0511  AST 23 23  ALT 29 24  ALKPHOS 179* 166*  BILITOT 0.5 0.7  PROT 5.8* 6.5  ALBUMIN 1.8* 2.1*   No results for input(s): LIPASE, AMYLASE in the last 168 hours. No results for input(s): AMMONIA in the last 168 hours. Coagulation Profile: No results for input(s): INR, PROTIME in the last 168 hours. Cardiac Enzymes: No results for input(s): CKTOTAL, CKMB, CKMBINDEX, TROPONINI in the last 168 hours. BNP (last 3 results) No results for input(s): PROBNP in the last 8760 hours. HbA1C: No results for input(s): HGBA1C in the last 72  hours. CBG:  Recent Labs Lab 12/06/16 1734 12/07/16 0733 12/07/16 1125 12/07/16 1657 12/08/16 0737  GLUCAP 112* 115* 118* 88 71   Lipid Profile: No results for input(s): CHOL, HDL, LDLCALC, TRIG, CHOLHDL, LDLDIRECT in the last 72 hours. Thyroid Function Tests: No results for input(s): TSH, T4TOTAL, FREET4, T3FREE, THYROIDAB in the last 72 hours. Anemia Panel: No results for input(s): VITAMINB12, FOLATE, FERRITIN, TIBC, IRON, RETICCTPCT in the last 72 hours. Sepsis Labs: No results for input(s): PROCALCITON, LATICACIDVEN in the last 168 hours.  Recent Results (from the past 240 hour(s))  C difficile quick scan w PCR reflex     Status: None   Collection Time: 12/08/16  4:53 PM  Result Value Ref Range Status   C Diff antigen NEGATIVE NEGATIVE Final   C Diff toxin NEGATIVE NEGATIVE Final   C Diff interpretation No C. difficile detected.  Final       Radiology Studies: Dg Chest 1 View  Result Date: 12/08/2016 CLINICAL DATA:  Dyspnea, left side chest pain EXAM: CHEST 1 VIEW COMPARISON:  12/02/2016 FINDINGS: Cardiomegaly again noted. Stable right PICC line position with tip in distal SVC. No pulmonary edema. Emphysematous changes bilaterally again noted. Small bilateral pleural effusion with bilateral basilar atelectasis. IMPRESSION: Stable right PICC line position with tip in distal SVC. No pulmonary edema. Emphysematous changes bilaterally again noted. Small bilateral pleural effusion with bilateral basilar atelectasis. Electronically Signed   By: Lahoma Crocker M.D.   On: 12/08/2016 09:32      Scheduled Meds: . acetaminophen  1,000 mg Oral Q6H  . amLODipine  10 mg Oral Daily  . carvedilol  12.5 mg Oral BID WC  . chlorthalidone  50 mg Oral Daily  . cloNIDine  0.1 mg Oral Daily  . enoxaparin (LOVENOX) injection  40 mg Subcutaneous Q24H  . famotidine  20 mg Oral QHS  . feeding supplement (PRO-STAT SUGAR FREE 64)  30 mL Oral BID  . hydrALAZINE  50 mg Oral Q6H  . lip balm  1  application Topical BID  . losartan  50 mg Oral Daily  . mirtazapine  7.5 mg Oral QHS  . multivitamins with iron  1 tablet Oral Daily  . saccharomyces boulardii  250 mg Oral BID   Continuous Infusions:    LOS: 42 days    Time spent: 30 minutes   Dessa Phi, DO [facility] [email or web address] Password [email or web address] 12/09/2016, 10:20 AM

## 2016-12-11 ENCOUNTER — Non-Acute Institutional Stay (SKILLED_NURSING_FACILITY): Payer: Medicare Other | Admitting: Family

## 2016-12-11 DIAGNOSIS — L89323 Pressure ulcer of left buttock, stage 3: Secondary | ICD-10-CM | POA: Diagnosis not present

## 2016-12-11 DIAGNOSIS — R1084 Generalized abdominal pain: Secondary | ICD-10-CM

## 2016-12-11 DIAGNOSIS — R112 Nausea with vomiting, unspecified: Secondary | ICD-10-CM

## 2016-12-11 NOTE — Progress Notes (Signed)
Location:  [facility] Room Number: [place] of Service:  SNF (31) Provider: Dinah Ngetich FNP-C   Osborne Casco, MD  Patient Care Team: Kelton Pillar, MD as PCP - General (Family Medicine)  Extended Emergency Contact Information Primary Emergency Contact: Shari Prows Address: [address] [place] [address] Johnnette Litter of [place] Phone: [phone] Mobile Phone: [phone] Relation: Daughter Secondary Emergency Contact: Doreatha Martin, [place] [address] [place] of [employer] Phone: [phone] Relation: Grandaughter  Code Status:  Full Code  Goals of care: Advanced Directive information Advanced Directives 10/28/2016  Does Patient Have a Medical Advance Directive? Yes  Type of Advance Directive [place]  Does patient want to make changes to medical advance directive? No - Patient declined  Copy of [place] in Chart? -  Would patient like information on creating a medical advance directive? -     Chief Complaint  Patient presents with  . Acute Visit    abdominal pain     HPI:  Pt is a 81 y.o. female seen today at [facility] for an acute visit for evaluation of abdominal pain.She is status post hospital admission from 10/28/2016-12/09/2016 for abdominal pain.She is status post Laparoscopic Cholecystectomy with intraoperative cholangiogram, 10/30/16 with Dr. Johnathan Hausen; Exploratory laparotomy with LOA, 11/17/16 with Dr. Dalbert Batman - findings: Mechnical obstruction and  Exploratory laparotomy, LOA, reduction of SB volvulus 11/24/2016 with  Dr. Zella Richer. She has a medical history of HTN, COPD, Hyperlipidemia, GERD, Anxiety among other conditions. She is seen in her room today lying in the bed. She rates generalized abdominal pain 10/10 though states pain easing off after taking pain medication. Patient's daughter states pain got out  of control over the weekend since patient did not get any pain medication. She had an episode of nausea and vomiting in the morning prior to visit. Zofran given with relief. She continue to have poor appetite. She denies any fever, chills or constipation.     Past Medical History:  Diagnosis Date  . Anxiousness   . COPD (chronic obstructive pulmonary disease) ([place])   . Hyperlipidemia   . Hypertension   . Small bowel obstruction due to adhesions 11/17/2016  . Uterine fibroid    Past Surgical History:  Procedure Laterality Date  . BACK SURGERY     X2  . bone removed from skull Right 1957  . CHOLECYSTECTOMY N/A 10/30/2016   Procedure: LAPAROSCOPIC CHOLECYSTECTOMY WITH INTRAOPERATIVE CHOLANGIOGRAM;  Surgeon: Johnathan Hausen, MD;  Location: [facility] ORS;  Service: General;  Laterality: N/A;  . JOINT REPLACEMENT     knee  . LAPAROTOMY N/A 11/17/2016   Procedure: EXPLORATORY LAPAROTOMY WITH LYSIS OF ADHESIONS;  Surgeon: Fanny Skates, MD;  Location: [facility];  Service: General;  Laterality: N/A;  . LAPAROTOMY N/A 11/24/2016   Procedure: RE DO EXPLORATORY LAPAROTOMY;  Surgeon: Jackolyn Confer, MD;  Location: [facility];  Service: General;  Laterality: N/A;    Allergies  Allergen Reactions  . Nsaids     hypertension  . Ace Inhibitors Other (See Comments)    cough    Allergies as of 12/11/2016      Reactions   Nsaids    hypertension   Ace Inhibitors Other (See Comments)   cough      Medication List       Accurate as of 12/11/16  4:25 PM. Always use your most recent med list.          amLODipine 10 MG tablet Commonly known as:  NORVASC Take 1 tablet (10 mg total) by mouth daily.   APAP 325 MG tablet She can take 2-3 tablets every 6 hours as needed for pain.   aspirin EC 81 MG tablet Take 81 mg by mouth daily.   carvedilol 12.5 MG tablet Commonly known as:  COREG Take 1 tablet (12.5 mg total) by mouth 2 (two) times daily with a meal.   chlorthalidone 25 MG tablet Commonly known as:   HYGROTON Take 2 tablets (50 mg total) by mouth daily.   cloNIDine 0.1 MG tablet Commonly known as:  CATAPRES Take 0.1 mg by mouth daily.   feeding supplement (PRO-STAT SUGAR FREE 64) Liqd You can use this or whatever protein supplement you enjoy.   hydrALAZINE 50 MG tablet Commonly known as:  APRESOLINE Take 1 tablet (50 mg total) by mouth 4 (four) times daily.   loperamide 2 MG capsule Commonly known as:  IMODIUM Take 2 mg by mouth as needed for diarrhea or loose stools. 2 mg Capsule by mouth after every loose stool. Do not exceed more than 16 mg in 24 Hours   losartan 50 MG tablet Commonly known as:  COZAAR Take 1 tablet (50 mg total) by mouth daily.   mirtazapine 15 MG disintegrating tablet Commonly known as:  REMERON SOL-TAB Take 0.5 tablets (7.5 mg total) by mouth at bedtime.   multivitamins with iron Tabs tablet Take 1 tablet by mouth daily.   omeprazole 40 MG capsule Commonly known as:  PRILOSEC Take 40 mg by mouth daily.   oxyCODONE 5 MG immediate release tablet Commonly known as:  Oxy IR/ROXICODONE Take 1-2 tablets (5-10 mg total) by mouth every 6 (six) hours as needed for moderate pain (pain not relieved by tylenol/acetaminophen).       Review of Systems  Constitutional: Negative for activity change, chills, fatigue and fever.       Poor appetite   HENT: Negative for congestion, rhinorrhea, sinus pain, sinus pressure, sneezing and sore throat.   Eyes: Negative.   Respiratory: Negative for cough, chest tightness, shortness of breath and wheezing.   Cardiovascular: Negative for chest pain, palpitations and leg swelling.  Gastrointestinal: Positive for abdominal pain, nausea and vomiting. Negative for abdominal distention, constipation and diarrhea.  Genitourinary: Negative for flank pain, frequency and urgency.  Musculoskeletal: Negative for back pain.  Skin: Negative for color change, pallor and rash.       Left gluteal pressure ulcer managed by wound care  Nurse  Neurological: Negative for dizziness, seizures, syncope, light-headedness, numbness and headaches.  Psychiatric/Behavioral: Negative for agitation, confusion and sleep disturbance. The patient is not nervous/anxious.      There is no immunization history on file for this patient. Pertinent  Health Maintenance Due  Topic Date Due  . DEXA SCAN  02/04/2003  . PNA vac Low Risk Adult (1 of 2 - PCV13) 02/04/2003  . INFLUENZA VACCINE  05/09/2016    Vitals:   12/11/16 1600  BP: 127/72  Pulse: 82  Resp: 18  Temp: 97.9 F (36.6 C)  SpO2: 96%  Weight: 181 lb (82.1 kg)  Height: 5\' 2"  (1.575 m)   Body mass index is 33.11 kg/m. Physical Exam  Constitutional: She is oriented to person, place, and time. She appears well-developed and well-nourished. No distress.  HENT:  Head: Normocephalic.  Mouth/Throat: Oropharynx is clear and  moist. No oropharyngeal exudate.  Eyes: Conjunctivae and EOM are normal. Pupils are equal, round, and reactive to light. Right eye exhibits no discharge. Left eye exhibits no discharge. No scleral icterus.  Neck: Normal range of motion. No JVD present. No thyromegaly present.  Cardiovascular: Normal rate, regular rhythm, normal heart sounds and intact distal pulses.  Exam reveals no gallop and no friction rub.   No murmur heard. Pulmonary/Chest: Effort normal and breath sounds normal. No respiratory distress. She has no wheezes. She has no rales.  Abdominal: Soft. Bowel sounds are normal. She exhibits no distension. There is no rebound and no guarding.  Slight generalized tender to touch. Mid line incision with steri-strips dry clean and intact.   Musculoskeletal: She exhibits no edema, tenderness or deformity.  Unsteady gait   Lymphadenopathy:    She has no cervical adenopathy.  Neurological: She is oriented to person, place, and time.  Skin: Skin is warm and dry. No rash noted. No erythema. No pallor.  # 1. Mid line incision with steri-strips dry clean  and intact.surrounding skin tissues without any signs of infections.  # 2. Left Gluteal Pressure ulcer; Wound bed pink in color with moderate yellow skin tissue. Surrounding skin without any signs of infections.  # 3. Right gluteal/sacral area excoriations. No signs of infections.    Psychiatric: She has a normal mood and affect.    Labs reviewed:  Recent Labs  11/30/16 0413  12/04/16 0441  12/06/16 0500 12/07/16 0511 12/08/16 0447 12/08/16 1816 12/09/16 0416  NA 137  < > 138  < > 142 141 143  --  140  K 3.5  < > 2.7*  < > 3.0* 4.0 3.8 3.3* 3.5  CL 109  < > 101  < > 105 106 109  --  107  CO2 23  < > 30  < > 30 28 26   --  25  GLUCOSE 126*  < > 132*  < > 108* 98 77  --  79  BUN 18  < > 17  < > 18 17 14   --  11  CREATININE 0.40*  < > 0.45  < > 0.43* 0.49 0.55  --  0.42*  CALCIUM 7.9*  < > 8.2*  < > 8.2* 8.2* 8.5*  --  8.3*  MG 1.7  --  1.5*  < > 2.0 2.0  --  1.6*  --   PHOS 3.8  --  3.5  --   --  3.2  --   --   --   < > = values in this interval not displayed.  Recent Labs  11/30/16 0413 12/04/16 0441 12/07/16 0511  AST 27 23 23   ALT 47 29 24  ALKPHOS 227* 179* 166*  BILITOT 0.6 0.5 0.7  PROT 5.8* 5.8* 6.5  ALBUMIN 1.7* 1.8* 2.1*    Recent Labs  11/27/16 0444  12/04/16 0441  12/07/16 1044 12/08/16 0447 12/09/16 0416  WBC 16.4*  < > 11.2*  < > 9.4 11.0* 11.3*  NEUTROABS 13.2*  --  8.2*  --  5.4  --   --   HGB 9.2*  < > 7.7*  < > 7.7* 7.9* 7.6*  HCT 27.4*  < > 23.2*  < > 23.8* 24.3* 23.4*  MCV 80.8  < > 80.8  < > 82.4 82.9 83.9  PLT 417*  < > 375  < > 378 374 358  < > = values in this interval not displayed. No results found  for: TSH Lab Results  Component Value Date   HGBA1C 5.4 11/11/2016   Lab Results  Component Value Date   TRIG 97 12/04/2016    Significant Diagnostic Results in last 30 days:  Dg Chest 1 View  Result Date: 12/08/2016 CLINICAL DATA:  Dyspnea, left side chest pain EXAM: CHEST 1 VIEW COMPARISON:  12/02/2016 FINDINGS: Cardiomegaly  again noted. Stable right PICC line position with tip in distal SVC. No pulmonary edema. Emphysematous changes bilaterally again noted. Small bilateral pleural effusion with bilateral basilar atelectasis. IMPRESSION: Stable right PICC line position with tip in distal SVC. No pulmonary edema. Emphysematous changes bilaterally again noted. Small bilateral pleural effusion with bilateral basilar atelectasis. Electronically Signed   By: Lahoma Crocker M.D.   On: 12/08/2016 09:32   Dg Abd 1 View  Result Date: 12/06/2016 CLINICAL DATA:  Mid abdominal pain. Recent recurrent small bowel obstruction, with surgery on 11/24/2016. EXAM: ABDOMEN - 1 VIEW COMPARISON:  11/27/2016 FINDINGS: There are no dilated loops of large or small bowel. No visible free air or free fluid on these supine radiographs. Surgical staples are seen in the midline. No acute bone abnormality. NG tube has been removed. IMPRESSION: No residual dilated small bowel loops.  Benign-appearing abdomen. Electronically Signed   By: Lorriane Shire M.D.   On: 12/06/2016 11:02   Dg Abd 1 View  Result Date: 11/27/2016 CLINICAL DATA:  81 year old female with abdominal pain EXAM: ABDOMEN - 1 VIEW COMPARISON:  abdominal radiograph dated 11/24/2016 FINDINGS: Partially visualized enteric tube in similar position as the prior radiograph and likely in the distal stomach. There is persistent mildly distended air-filled loop of small bowel in the left lower quadrant measuring up to 3.6 cm in diameter similar to the prior radiograph. Air is noted within the colon. No free air identified on the provided image. Right upper quadrant cholecystectomy clips noted. There is degenerative changes of the spine with lower lumbar laminectomy and fusion hardware. There is sclerotic changes of sacroiliitis bilaterally. No acute fracture. Midline vertical anterior pelvic wall surgical staples. The soft tissues are grossly unremarkable. IMPRESSION: 1. Stable positioning of the enteric  tube with tip in the distal stomach. 2. Stable mildly dilated air-filled loop of small bowel in the left lower quadrant measuring up to 3.6 cm in diameter. Follow-up recommended. Electronically Signed   By: Anner Crete M.D.   On: 11/27/2016 02:03   Ct Angio Chest Pe W Or Wo Contrast  Result Date: 11/20/2016 CLINICAL DATA:  Acute onset chest pain yesterday. Patient status post laparoscopic cholecystectomy 10/30/2016. EXAM: CT ANGIOGRAPHY CHEST WITH CONTRAST TECHNIQUE: Multidetector CT imaging of the chest was performed using the standard protocol during bolus administration of intravenous contrast. Multiplanar CT image reconstructions and MIPs were obtained to evaluate the vascular anatomy. CONTRAST:  100 cc Isovue 370. COMPARISON:  PA and lateral chest 10/28/2016. Single-view of the chest 11/20/2016. FINDINGS: Cardiovascular: No pulmonary embolus is identified. Heart size is enlarged. No pericardial effusion. Calcific aortic and coronary atherosclerosis is identified. Mediastinum/Nodes: No enlarged mediastinal, hilar, or axillary lymph nodes. Thyroid gland, trachea, and esophagus demonstrate no significant findings. Lungs/Pleura: No pleural effusion. Lungs demonstrate centrilobular emphysematous disease. Mild dependent atelectasis is seen. No consolidative process, nodule or mass. Upper Abdomen: Negative. Musculoskeletal: No acute or focal bony abnormality. Diffuse subcutaneous edema is seen. Review of the MIP images confirms the above findings. IMPRESSION: Negative for pulmonary embolus or acute disease. Emphysema. Atherosclerosis. Body wall edema compatible with anasarca. Electronically Signed   By: Marcello Moores  Dalessio M.D.   On: 11/20/2016 15:50   Ct Abdomen Pelvis W Contrast  Result Date: 11/23/2016 CLINICAL DATA:  Persistent generalized abdominal pain. EXAM: CT ABDOMEN AND PELVIS WITH CONTRAST TECHNIQUE: Multidetector CT imaging of the abdomen and pelvis was performed using the standard protocol  following bolus administration of intravenous contrast. CONTRAST:  131mL ISOVUE-300 IOPAMIDOL (ISOVUE-300) INJECTION 61% COMPARISON:  CT scan of November 10, 2016. FINDINGS: Lower chest: No acute abnormality. Hepatobiliary: No focal liver abnormality is seen. Status post cholecystectomy. No biliary dilatation. Pancreas: Unremarkable. No pancreatic ductal dilatation or surrounding inflammatory changes. Spleen: Normal in size without focal abnormality. Adrenals/Urinary Tract: Adrenal glands are unremarkable. No hydronephrosis or renal obstruction is noted. Foley catheter is noted in the urinary bladder. Stomach/Bowel: Mild small bowel dilatation is noted consistent with ileus or possibly small bowel obstruction. Vascular/Lymphatic: Aortic atherosclerosis. No enlarged abdominal or pelvic lymph nodes. Reproductive: Uterus and bilateral adnexa are unremarkable. Other: Mild anasarca is noted. No abnormal fluid collection is noted. Musculoskeletal: Status post surgical posterior fusion extending from L2-L5. IMPRESSION: Aortic atherosclerosis. Mild anasarca. Stable mild small bowel dilatation is noted consistent with ileus or possible small bowel obstruction. Electronically Signed   By: Marijo Conception, M.D.   On: 11/23/2016 17:13   Dg Chest Port 1 View  Result Date: 12/02/2016 CLINICAL DATA:  Shortness of breath. EXAM: PORTABLE CHEST 1 VIEW COMPARISON:  11/29/2016 cavoatrial junction. FINDINGS: Right arm PICC line tip is in the projection of the cavoatrial junction. Mild cardiac enlargement. Pleural effusions are less apparent on today's study. No interstitial edema. IMPRESSION: 1. Decrease in appearance of bilateral pleural effusions. 2. No new findings. Electronically Signed   By: Kerby Moors M.D.   On: 12/02/2016 11:21   Dg Chest Port 1 View  Result Date: 11/29/2016 CLINICAL DATA:  Dyspnea EXAM: PORTABLE CHEST 1 VIEW COMPARISON:  Chest CT from 2 days ago FINDINGS: Stable borderline heart size and unremarkable  mediastinal contours when accounting for distortion by rotation. Right upper extremity PICC with tip at the SVC level. Haziness of the lower chest from pleural effusions with mild atelectasis. No Kerley lines. No pneumothorax. No air bronchograms. IMPRESSION: Small pleural effusions and mild atelectasis at the bases, similar in chest CT 2 days ago. Electronically Signed   By: Monte Fantasia M.D.   On: 11/29/2016 12:38   Dg Chest Port 1v Same Day  Result Date: 11/20/2016 CLINICAL DATA:  Chest pain EXAM: PORTABLE CHEST 1 VIEW COMPARISON:  November 03, 2016 FINDINGS: Nasogastric tube tip and side port are below the diaphragm. Central catheter tip is in the superior vena cava. No pneumothorax. There is no edema or consolidation. Heart size and pulmonary vascularity are normal. No adenopathy. IMPRESSION: Tube and catheter positions as described. No evident pneumothorax. No edema or consolidation. Electronically Signed   By: Lowella Grip III M.D.   On: 11/20/2016 09:03   Dg Abd 2 Views  Result Date: 11/12/2016 CLINICAL DATA:  Generalized abdominal pain since cholecystostomy 10/30/2016. Concern for ileus EXAM: ABDOMEN - 2 VIEW COMPARISON:  Abdominal CT from 2 days ago FINDINGS: Unchanged small bowel distention, out of proportion to colonic diameter. Small bowel loops in the left abdomen measure up to 35 mm. No evidence of pneumoperitoneum. No concerning mass effect or gas collection. Cholecystectomy clips. Spinal fixation hardware. Clear lung bases. IMPRESSION: Unchanged small bowel dilatation. Postoperative status favors ileus, although there is notable lack of colonic involvement. No pneumoperitoneum. Electronically Signed   By: Neva Seat.D.  On: 11/12/2016 20:54   Dg Abd Portable 1v  Result Date: 11/24/2016 CLINICAL DATA:  Location of NG tube. Nausea. Status post cholecystectomy on 10/30/2016 with exploratory laparotomy for removal of adhesions on 11/17/2016 and redo exploratory laparatomy  today. EXAM: PORTABLE ABDOMEN - 1 VIEW COMPARISON:  11/16/2016 FINDINGS: The tip and side port of a gastric tube is seen below the left hemidiaphragm in the expected location of the body of the stomach with tip crossing midline reports the right lower quadrant. Mild scattered gas containing small and large bowel loops are noted. Postsurgical clips are seen overlying the pelvis. Patient status post multilevel lumbar spinal fusion. IMPRESSION: The tip and side hole of a gastric tube are in the expected location of the gastric antrum and body. These results will be called to the ordering clinician or representative by the Radiologist Assistant, and communication documented in the PACS or zVision Dashboard. Electronically Signed   By: Ashley Royalty M.D.   On: 11/24/2016 19:55   Dg Abd Portable 1v  Result Date: 11/16/2016 CLINICAL DATA:  Ileus following cholecystectomy. Severe lower abdominal pain. EXAM: PORTABLE ABDOMEN - 1 VIEW COMPARISON:  11/14/2016 FINDINGS: Mild gaseous distention of bowel loops in the abdomen and pelvis, predominantly small bowel loops. Findings are similar to prior study. No free air organomegaly. Prior cholecystectomy. IMPRESSION: Continued bowel dilatation in the abdomen and pelvis, predominantly small bowel. Findings could reflect small bowel obstruction or ileus. Electronically Signed   By: Rolm Baptise M.D.   On: 11/16/2016 09:12   Dg Abd Portable 1v  Result Date: 11/14/2016 CLINICAL DATA:  Diffuse abdominal pain and distension EXAM: PORTABLE ABDOMEN - 1 VIEW COMPARISON:  Supine and left-side-down decubitus radiographs of November 12, 2016 FINDINGS: There remain loops of moderately distended gas-filled small bowel in the mid abdomen. Only a small amount of gas is noted within a normal calibered colon. There surgical clips in the gallbladder fossa. The patient has undergone posterior and interbody fusion from L3 through S1. There are phleboliths within the pelvis. IMPRESSION: Findings  compatible with a mid to distal small bowel obstruction. There is no evidence of perforation. The volume of small bowel gas has not significantly changed since the study of 2 days ago. Electronically Signed   By: David  Martinique M.D.   On: 11/14/2016 08:24   Ct Angio Chest/abd/pel For Dissection W And/or W/wo  Result Date: 11/27/2016 CLINICAL DATA:  81 year old female with chest and abdominal pain. EXAM: CT ANGIOGRAPHY CHEST, ABDOMEN AND PELVIS TECHNIQUE: Multidetector CT imaging through the chest, abdomen and pelvis was performed using the standard protocol during bolus administration of intravenous contrast. Multiplanar reconstructed images and MIPs were obtained and reviewed to evaluate the vascular anatomy. CONTRAST:  100 cc Isovue 370 COMPARISON:  Abdominal CT 11/23/2016 FINDINGS: CTA CHEST FINDINGS Cardiovascular: There is mild cardiomegaly with biatrial dilatation. Multi vessel coronary vascular disease as well as calcification of the mitral annulus noted. There is a retrograde flow of contrast from the right atrium into the IVC compatible with a degree of right cardiac dysfunction. No pericardial effusion. There is atherosclerotic calcification of the thoracic aorta and the origins of the great vessels of the aortic arch. There is common origin of the left common carotid artery and right brachiocephalic trunk. No aortic aneurysm or dissection. Evaluation of the pulmonary arteries is limited due to suboptimal opacification of the peripheral branches as well as respiratory motion artifact. No definite central pulmonary artery embolus identified. V/Q scan may provide better evaluation if there is  high clinical concern for pulmonary embolism. Mediastinum/Nodes: There is no hilar or mediastinal adenopathy. An enteric tube is noted within the esophagus. Lungs/Pleura: There is emphysematous changes of the lungs. There are small bilateral pleural effusions, new from prior CT of 11/20/2016. Subsegmental  consolidative changes of the lung bases noted which may be related to compressive atelectasis or pneumonia. No pneumothorax. The central airways are patent. Musculoskeletal: Top-normal bilateral axillary lymph nodes of indeterminate etiology, possibly reactive. Clinical correlation is recommended. There is diffuse subcutaneous soft tissue edema and anasarca. No fluid collection. There is degenerative changes of the spine. No acute fracture. Review of the MIP images confirms the above findings. CTA ABDOMEN AND PELVIS FINDINGS VASCULAR Evaluation is limited due to respiratory motion artifact as well as streak artifact caused by metallic spinal hardware. Aorta: Advanced aortoiliac atherosclerotic disease. No aneurysmal dilatation or evidence of dissection Celiac: There is atherosclerotic calcification of the origin of the celiac axis with high-grade inclusion of the vessel. Only a small amount of contrast noted in the proximal portion of the celiac axis. SMA: Advanced atherosclerotic calcification of the origin of the SMA with diminished flow within the vessel. Renals: There is atherosclerotic calcification of the ostia of the renal arteries. The renal arteries however remain patent. IMA: There is atherosclerotic calcification of the origin of the IMA. The IMA remain patent. Inflow: The iliac arteries remain patent. No aneurysmal dilatation for dissection. Veins: No obvious venous abnormality within the limitations of this arterial phase study. Review of the MIP images confirms the above findings. NON-VASCULAR Evaluation of the structures is very limited due to anasarca and artifact caused by metallic spinal hardware. Small pocket of extraluminal air in the anterior pelvis (series 10 image 144) is likely related to recent surgery. Small pocket of air noted along the midline vertical surgical incision. There is a small ascites and diffuse mesenteric edema. Hepatobiliary: Cholecystectomy. The liver is grossly  unremarkable with limited evaluation. Pancreas: There is limited visualization of the pancreas. The pancreas is grossly unremarkable. Spleen: Grossly unremarkable on limited evaluation. Adrenals/Urinary Tract: The left adrenal gland appears unremarkable. The right adrenal gland is not well visualized. There is no hydronephrosis on either side. The urinary bladder is partially decompressed around a Foley catheter. Stomach/Bowel: Evaluation of the bowel is very limited in the absence of oral contrast. An enteric feeding extends into distal stomach. Multiple top-normal caliber fluid-filled loops of small bowel throughout the abdomen may represent postsurgical ileus. Thickened appearance of loops of small bowel in the lower abdomen and pelvis likely related to underdistention and ascites. Enteritis is not excluded. Lymphatic: No adenopathy. Reproductive: The uterus is not well visualized and limited pain evaluation. The uterus appears heterogeneous and probably contains a fibroid. Other: There is diffuse subcutaneous edema and anasarca. Midline vertical anterior pelvic wall incisional scar noted. Small amount of fluid noted along the surgical incision. A small pockets of air is also seen in the subcutaneous soft tissues of the midline anterior abdomen along the surgical incision. Musculoskeletal: Multilevel degenerative changes of the spine. Lower lumbar laminectomy and posterior fusion hardware. No acute fracture. Review of the MIP images confirms the above findings. IMPRESSION: 1. Emphysema with interval development of small bilateral pleural effusions and bibasilar subsegmental atelectasis versus less likely infiltrate. 2. Cardiomegaly with biatrial dilatation and evidence of right cardiac dysfunction. Correlation with echocardiogram recommended. 3. Small ascites, diffuse subcutaneous soft tissue edema and anasarca. 4. 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  Evaluation of  the vessels is very limited due to artifact. There is advanced aortoiliac atherosclerotic disease. 5. Mildly dilated fluid-filled loops of small bowel elbow likely related to ileus. Overall there has been interval improvement in the degree of bowel dilatation compared to the prior CT. Clinical correlation and follow-up recommended. Thickened appearing loops of small bowel likely related to under distention and ascites. Enteritis is less likely. 6. Postsurgical changes with focal small pockets of extraluminal air. No drainable fluid collection/abscess. Electronically Signed   By: Anner Crete M.D.   On: 11/27/2016 05:35    Assessment/Plan 1. Generalized abdominal pain Afebrile.Status post hospital admission from 10/28/2016-12/09/2016 for abdominal pain.She had Laparoscopic Cholecystectomy with intraoperative cholangiogram, 10/30/16 with Dr. Johnathan Hausen; Exploratory laparotomy with LOA, 11/17/16 with Dr. Dalbert Batman - findings: Mechnical obstruction and  Exploratory laparotomy, LOA, reduction of SB volvulus 11/24/2016 with  Dr. Zella Richer. Rates pain 10/10 on scale though states easing off after getting pain medication. Will change Oxycodone 5 mg Tablet 1-2 tablets to every 4 Hours as needed for pain. Continue to monitor.   2. Decubitus ulcer of left buttock, stage 3 Afebrile. Wound bed pink in color with moderate yellow skin tissue. Surrounding skin without any signs of infections. Continue wound care.   3. Nausea  Had one episode of vomiting prior to visit in the morning but none since after Zofran was given. Continue on Zofran 4 mg Tablet every 6 hours PRN. Phenergan 25 mg Tablet/ suppository or I.M every 6 hours as needed for nausea/vomiting x 10 days. Encourage oral and fluid intake.     Family/ staff Communication: Reviewed plan of care with patient, patient's daughter and Nurse supervisor.   Labs/tests ordered: CBC/diff, BMP 12/12/2016

## 2016-12-12 ENCOUNTER — Non-Acute Institutional Stay (SKILLED_NURSING_FACILITY): Payer: Medicare Other | Admitting: Internal Medicine

## 2016-12-12 ENCOUNTER — Encounter (HOSPITAL_COMMUNITY): Payer: Self-pay | Admitting: Emergency Medicine

## 2016-12-12 ENCOUNTER — Inpatient Hospital Stay (HOSPITAL_COMMUNITY)
Admission: EM | Admit: 2016-12-12 | Discharge: 2016-12-17 | DRG: 871 | Disposition: A | Discharge status: Hospice | Payer: Medicare Other | Attending: Internal Medicine | Admitting: Internal Medicine

## 2016-12-12 ENCOUNTER — Encounter: Payer: Self-pay | Admitting: Internal Medicine

## 2016-12-12 ENCOUNTER — Emergency Department (HOSPITAL_COMMUNITY): Payer: Medicare Other

## 2016-12-12 DIAGNOSIS — L8943 Pressure ulcer of contiguous site of back, buttock and hip, stage 3: Secondary | ICD-10-CM

## 2016-12-12 DIAGNOSIS — K565 Intestinal adhesions [bands], unspecified as to partial versus complete obstruction: Secondary | ICD-10-CM | POA: Diagnosis not present

## 2016-12-12 DIAGNOSIS — K219 Gastro-esophageal reflux disease without esophagitis: Secondary | ICD-10-CM

## 2016-12-12 DIAGNOSIS — R1114 Bilious vomiting: Secondary | ICD-10-CM | POA: Diagnosis not present

## 2016-12-12 DIAGNOSIS — E860 Dehydration: Secondary | ICD-10-CM | POA: Diagnosis present

## 2016-12-12 DIAGNOSIS — E785 Hyperlipidemia, unspecified: Secondary | ICD-10-CM | POA: Diagnosis present

## 2016-12-12 DIAGNOSIS — J690 Pneumonitis due to inhalation of food and vomit: Secondary | ICD-10-CM | POA: Diagnosis present

## 2016-12-12 DIAGNOSIS — K810 Acute cholecystitis: Secondary | ICD-10-CM | POA: Diagnosis not present

## 2016-12-12 DIAGNOSIS — Z66 Do not resuscitate: Secondary | ICD-10-CM | POA: Diagnosis not present

## 2016-12-12 DIAGNOSIS — F329 Major depressive disorder, single episode, unspecified: Secondary | ICD-10-CM | POA: Diagnosis present

## 2016-12-12 DIAGNOSIS — J189 Pneumonia, unspecified organism: Secondary | ICD-10-CM

## 2016-12-12 DIAGNOSIS — R4182 Altered mental status, unspecified: Secondary | ICD-10-CM | POA: Diagnosis not present

## 2016-12-12 DIAGNOSIS — R112 Nausea with vomiting, unspecified: Secondary | ICD-10-CM | POA: Diagnosis present

## 2016-12-12 DIAGNOSIS — Z96659 Presence of unspecified artificial knee joint: Secondary | ICD-10-CM | POA: Diagnosis present

## 2016-12-12 DIAGNOSIS — I10 Essential (primary) hypertension: Secondary | ICD-10-CM | POA: Diagnosis not present

## 2016-12-12 DIAGNOSIS — Z7982 Long term (current) use of aspirin: Secondary | ICD-10-CM

## 2016-12-12 DIAGNOSIS — R531 Weakness: Secondary | ICD-10-CM

## 2016-12-12 DIAGNOSIS — R0600 Dyspnea, unspecified: Secondary | ICD-10-CM

## 2016-12-12 DIAGNOSIS — G9340 Encephalopathy, unspecified: Secondary | ICD-10-CM | POA: Diagnosis present

## 2016-12-12 DIAGNOSIS — K56609 Unspecified intestinal obstruction, unspecified as to partial versus complete obstruction: Secondary | ICD-10-CM | POA: Diagnosis not present

## 2016-12-12 DIAGNOSIS — R1084 Generalized abdominal pain: Secondary | ICD-10-CM

## 2016-12-12 DIAGNOSIS — A419 Sepsis, unspecified organism: Principal | ICD-10-CM | POA: Diagnosis present

## 2016-12-12 DIAGNOSIS — G9341 Metabolic encephalopathy: Secondary | ICD-10-CM | POA: Diagnosis present

## 2016-12-12 DIAGNOSIS — Z515 Encounter for palliative care: Secondary | ICD-10-CM | POA: Diagnosis not present

## 2016-12-12 DIAGNOSIS — G8929 Other chronic pain: Secondary | ICD-10-CM | POA: Diagnosis present

## 2016-12-12 DIAGNOSIS — R197 Diarrhea, unspecified: Secondary | ICD-10-CM

## 2016-12-12 DIAGNOSIS — R630 Anorexia: Secondary | ICD-10-CM

## 2016-12-12 DIAGNOSIS — Z7189 Other specified counseling: Secondary | ICD-10-CM

## 2016-12-12 DIAGNOSIS — Z6829 Body mass index (BMI) 29.0-29.9, adult: Secondary | ICD-10-CM

## 2016-12-12 DIAGNOSIS — D500 Iron deficiency anemia secondary to blood loss (chronic): Secondary | ICD-10-CM | POA: Diagnosis not present

## 2016-12-12 DIAGNOSIS — L89152 Pressure ulcer of sacral region, stage 2: Secondary | ICD-10-CM | POA: Diagnosis present

## 2016-12-12 DIAGNOSIS — E43 Unspecified severe protein-calorie malnutrition: Secondary | ICD-10-CM | POA: Diagnosis present

## 2016-12-12 DIAGNOSIS — E876 Hypokalemia: Secondary | ICD-10-CM | POA: Diagnosis not present

## 2016-12-12 DIAGNOSIS — Y95 Nosocomial condition: Secondary | ICD-10-CM | POA: Diagnosis present

## 2016-12-12 DIAGNOSIS — J449 Chronic obstructive pulmonary disease, unspecified: Secondary | ICD-10-CM | POA: Diagnosis present

## 2016-12-12 DIAGNOSIS — Z79899 Other long term (current) drug therapy: Secondary | ICD-10-CM

## 2016-12-12 DIAGNOSIS — Z87891 Personal history of nicotine dependence: Secondary | ICD-10-CM

## 2016-12-12 LAB — URINALYSIS, ROUTINE W REFLEX MICROSCOPIC
Bilirubin Urine: NEGATIVE
Glucose, UA: NEGATIVE mg/dL
Hgb urine dipstick: NEGATIVE
Ketones, ur: 80 mg/dL — AB
LEUKOCYTES UA: NEGATIVE
NITRITE: NEGATIVE
PH: 5 (ref 5.0–8.0)
Protein, ur: NEGATIVE mg/dL
Specific Gravity, Urine: 1.015 (ref 1.005–1.030)

## 2016-12-12 LAB — COMPREHENSIVE METABOLIC PANEL
ALBUMIN: 2.5 g/dL — AB (ref 3.5–5.0)
ALT: 21 U/L (ref 14–54)
ANION GAP: 14 (ref 5–15)
AST: 30 U/L (ref 15–41)
Alkaline Phosphatase: 116 U/L (ref 38–126)
BUN: 16 mg/dL (ref 6–20)
CHLORIDE: 102 mmol/L (ref 101–111)
CO2: 24 mmol/L (ref 22–32)
Calcium: 7.8 mg/dL — ABNORMAL LOW (ref 8.9–10.3)
Creatinine, Ser: 1.03 mg/dL — ABNORMAL HIGH (ref 0.44–1.00)
GFR calc Af Amer: 59 mL/min — ABNORMAL LOW (ref >60)
GFR, EST NON AFRICAN AMERICAN: 51 mL/min — AB (ref >60)
GLUCOSE: 95 mg/dL (ref 65–99)
POTASSIUM: 3.9 mmol/L (ref 3.5–5.1)
Sodium: 140 mmol/L (ref 135–145)
Total Bilirubin: 1.5 mg/dL — ABNORMAL HIGH (ref 0.3–1.2)
Total Protein: 7.1 g/dL (ref 6.5–8.1)

## 2016-12-12 LAB — CBC WITH DIFFERENTIAL/PLATELET
BASOS ABS: 0 10*3/uL (ref 0.0–0.1)
BASOS PCT: 0 %
Eosinophils Absolute: 0.2 10*3/uL (ref 0.0–0.7)
Eosinophils Relative: 1 %
HEMATOCRIT: 27.2 % — AB (ref 36.0–46.0)
HEMOGLOBIN: 8.9 g/dL — AB (ref 12.0–15.0)
LYMPHS PCT: 12 %
Lymphs Abs: 2.1 10*3/uL (ref 0.7–4.0)
MCH: 27 pg (ref 26.0–34.0)
MCHC: 32.7 g/dL (ref 30.0–36.0)
MCV: 82.4 fL (ref 78.0–100.0)
MONO ABS: 1.8 10*3/uL — AB (ref 0.1–1.0)
Monocytes Relative: 10 %
NEUTROS ABS: 14 10*3/uL — AB (ref 1.7–7.7)
NEUTROS PCT: 77 %
Platelets: 381 10*3/uL (ref 150–400)
RBC: 3.3 MIL/uL — ABNORMAL LOW (ref 3.87–5.11)
RDW: 19 % — AB (ref 11.5–15.5)
WBC: 18.1 10*3/uL — ABNORMAL HIGH (ref 4.0–10.5)

## 2016-12-12 LAB — I-STAT TROPONIN, ED: Troponin i, poc: 0.06 ng/mL (ref 0.00–0.08)

## 2016-12-12 LAB — I-STAT CG4 LACTIC ACID, ED: Lactic Acid, Venous: 1.3 mmol/L (ref 0.5–1.9)

## 2016-12-12 LAB — LIPASE, BLOOD: LIPASE: 22 U/L (ref 11–51)

## 2016-12-12 MED ORDER — PROMETHAZINE HCL 25 MG/ML IJ SOLN
12.5000 mg | Freq: Once | INTRAMUSCULAR | Status: AC
  Administered 2016-12-12: 12.5 mg via INTRAVENOUS
  Filled 2016-12-12: qty 1

## 2016-12-12 MED ORDER — SODIUM CHLORIDE 0.9 % IV BOLUS (SEPSIS)
500.0000 mL | Freq: Once | INTRAVENOUS | Status: AC
  Administered 2016-12-12: 500 mL via INTRAVENOUS

## 2016-12-12 MED ORDER — IOPAMIDOL (ISOVUE-300) INJECTION 61%
INTRAVENOUS | Status: AC
  Filled 2016-12-12: qty 100

## 2016-12-12 MED ORDER — IOPAMIDOL (ISOVUE-300) INJECTION 61%
100.0000 mL | Freq: Once | INTRAVENOUS | Status: AC | PRN
  Administered 2016-12-12: 100 mL via INTRAVENOUS

## 2016-12-12 NOTE — ED Triage Notes (Signed)
Per EMS, patient from [facility], staff reports patient has been vomiting since Saturday. States patient has been able to take medications. Patient reports she had her gallbladder removed within the past month. A&Ox4. Denies any complaints and pain.

## 2016-12-12 NOTE — Progress Notes (Signed)
LOCATION: [facility]  PCP: Osborne Casco, MD   Code Status: Full Code  Goals of care: Advanced Directive information Advanced Directives 10/28/2016  Does Patient Have a Medical Advance Directive? Yes  Type of Advance Directive [place]  Does patient want to make changes to medical advance directive? No - Patient declined  Copy of [place] in Chart? -  Would patient like information on creating a medical advance directive? -       Extended Emergency Contact Information Primary Emergency Contact: Flowers,Debra Address: [address]          [place], [place] [address] Johnnette Litter of [place] Phone: [phone] Mobile Phone: [phone] Relation: Daughter Secondary Emergency Contact: Doreatha Martin, [place] [address] Johnnette Litter of [place] Phone: [phone] Relation: Grandaughter   Allergies  Allergen Reactions  . Nsaids     hypertension  . Ace Inhibitors Other (See Comments)    cough    Chief Complaint  Patient presents with  . New Admit To SNF    New Admission Visit      HPI:  Patient is a 81 y.o. female seen today for short term rehabilitation post hospital admission from [id number] with biliary colic. She underwent laproscopic cholecystectomy with intraoperative cholangiogram on 10/30/16 and was found to have necrotic cholecystitis with free intraperitoneal bile. Post surgery, she developed ileus and required NG tube decompression. Without improvement, she underwent exploratory laparotomy with lysis of adhesion on 11/17/16. Following this, she had fever and worsening abdominal pain and was taken back to OR again. She was noted to have recurrent small bowel obstruction with mesentric volvulus with transient jejunal segment ischemia and underwent emergent exploratory laparotomy, lysis of adhesion and reduction of volvulus on 11/24/16.NG tube decompression was continued. Once this was  discontinued, she was placed on parenteral nutrition. With her tolerating her diet, TNA was discontinued. She is seen in her room today with her daughter at bedside. She has been nauseous and unable to take solid food x 2 days. She also complaints of loose stool. She has vomited x 1 this am and mentions seeing spots of blood. Denies frank hematemesis. She denies hematuria and hematochezia. She complaints of generalized abdominal pain with her nausea and vomiting but denies any abdominal pain at present. Denies any radiation of her abdominal pain.   Review of Systems:  Constitutional: Negative for fever, chills, diaphoresis. Feels weak and tired.Marland Kitchen  HENT: Negative for congestion, sore throat, difficulty swallowing. Positive for occasional headaches. Eyes: Negative for blurred vision, double vision and discharge.  Respiratory: Negative for cough, shortness of breath and wheezing.   Cardiovascular: Negative for chest pain, palpitations, leg swelling.  Gastrointestinal: see HPI Genitourinary: Negative for dysuria Musculoskeletal: Negative forfall in the morning.  Skin: Negative for itching, rash.  Neurological: Negative for dizziness. Psychiatric/Behavioral: Negative for depression   Past Medical History:  Diagnosis Date  . Anxiousness   . COPD (chronic obstructive pulmonary disease) ([place])   . Hyperlipidemia   . Hypertension   . Small bowel obstruction due to adhesions 11/17/2016  . Uterine fibroid    Past Surgical History:  Procedure Laterality Date  . BACK SURGERY     X2  . bone removed from skull Right 1957  . CHOLECYSTECTOMY N/A 10/30/2016   Procedure: LAPAROSCOPIC CHOLECYSTECTOMY WITH INTRAOPERATIVE CHOLANGIOGRAM;  Surgeon: Johnathan Hausen, MD;  Location: [facility];  Service: General;  Laterality: N/A;  . JOINT REPLACEMENT  knee  . LAPAROTOMY N/A 11/17/2016   Procedure: EXPLORATORY LAPAROTOMY WITH LYSIS OF ADHESIONS;  Surgeon: Fanny Skates, MD;  Location: [facility];  Service: General;   Laterality: N/A;  . LAPAROTOMY N/A 11/24/2016   Procedure: RE DO EXPLORATORY LAPAROTOMY;  Surgeon: Jackolyn Confer, MD;  Location: [facility];  Service: General;  Laterality: N/A;   Social History:   reports that she quit smoking about 18 years ago. She has never used smokeless tobacco. She reports that she drinks alcohol. She reports that she does not use drugs.  Family History  Problem Relation Age of Onset  . Heart disease Mother     before age 15    Medications: Allergies as of 12/12/2016      Reactions   Nsaids    hypertension   Ace Inhibitors Other (See Comments)   cough      Medication List       Accurate as of 12/12/16  2:14 PM. Always use your most recent med list.          acetaminophen 325 MG tablet Commonly known as:  TYLENOL Take 650 mg by mouth every 6 (six) hours as needed.   amLODipine 10 MG tablet Commonly known as:  NORVASC Take 1 tablet (10 mg total) by mouth daily.   aspirin EC 81 MG tablet Take 81 mg by mouth daily.   carvedilol 12.5 MG tablet Commonly known as:  COREG Take 1 tablet (12.5 mg total) by mouth 2 (two) times daily with a meal.   chlorthalidone 25 MG tablet Commonly known as:  HYGROTON Take 2 tablets (50 mg total) by mouth daily.   cloNIDine 0.1 MG tablet Commonly known as:  CATAPRES Take 0.1 mg by mouth daily.   feeding supplement (PRO-STAT SUGAR FREE 64) Liqd Take 30 mLs by mouth 2 (two) times daily.   hydrALAZINE 50 MG tablet Commonly known as:  APRESOLINE Take 1 tablet (50 mg total) by mouth 4 (four) times daily.   losartan 50 MG tablet Commonly known as:  COZAAR Take 1 tablet (50 mg total) by mouth daily.   mirtazapine 15 MG tablet Commonly known as:  REMERON Take 15 mg by mouth at bedtime.   multivitamins with iron Tabs tablet Take 1 tablet by mouth daily.   omeprazole 40 MG capsule Commonly known as:  PRILOSEC Take 40 mg by mouth daily.   oxyCODONE 5 MG immediate release tablet Commonly known as:  Oxy  IR/ROXICODONE Take 5-10 mg by mouth every 4 (four) hours as needed for severe pain.   promethazine 25 MG suppository Commonly known as:  PHENERGAN Place 25 mg rectally every 6 (six) hours as needed for nausea or vomiting.   promethazine 25 MG/ML injection Commonly known as:  PHENERGAN Inject 25 mg into the vein once.       Immunizations: Immunization History  Administered Date(s) Administered  . PPD Test 12/09/2016     Physical Exam: Vitals:   12/12/16 1403  BP: (!) 166/87  Pulse: 85  Resp: 18  Temp: (!) 96.4 F (35.8 C)  TempSrc: Oral  SpO2: 96%   There is no height or weight on file to calculate BMI.  General- elderly female, frail and thin built Head- normocephalic, atraumatic Nose- no nasal discharge Throat- moist mucus membrane, white coating over her tongue, no visible mouth sores Eyes- PERRLA, EOMI, no pallor, no icterus, no discharge Neck- no cervical lymphadenopathy Cardiovascular- normal s1,s2, no murmur Respiratory- bilateral clear to auscultation, no wheeze, no rhonchi, no crackles,  no use of accessory muscles Abdomen- bowel sounds present, soft, non tender, no guarding or rigidity, no CVA tenderness Musculoskeletal- able to move all 4 extremities, generalized weakness Neurological- alert and oriented to person, place and time Skin- warm and dry, surgical incision to mid abdominal wall with steri strips in place Psychiatry- normal mood and affect    Labs reviewed: Basic Metabolic Panel:  Recent Labs  11/30/16 0413  12/04/16 0441  12/06/16 0500 12/07/16 0511 12/08/16 0447 12/08/16 1816 12/09/16 0416  NA 137  < > 138  < > 142 141 143  --  140  K 3.5  < > 2.7*  < > 3.0* 4.0 3.8 3.3* 3.5  CL 109  < > 101  < > 105 106 109  --  107  CO2 23  < > 30  < > 30 28 26   --  25  GLUCOSE 126*  < > 132*  < > 108* 98 77  --  79  BUN 18  < > 17  < > 18 17 14   --  11  CREATININE 0.40*  < > 0.45  < > 0.43* 0.49 0.55  --  0.42*  CALCIUM 7.9*  < > 8.2*  < >  8.2* 8.2* 8.5*  --  8.3*  MG 1.7  --  1.5*  < > 2.0 2.0  --  1.6*  --   PHOS 3.8  --  3.5  --   --  3.2  --   --   --   < > = values in this interval not displayed. Liver Function Tests:  Recent Labs  11/30/16 0413 12/04/16 0441 12/07/16 0511  AST 27 23 23   ALT 47 29 24  ALKPHOS 227* 179* 166*  BILITOT 0.6 0.5 0.7  PROT 5.8* 5.8* 6.5  ALBUMIN 1.7* 1.8* 2.1*    Recent Labs  11/02/16 0455 11/05/16 0512 11/11/16 0945  LIPASE 13 26 13    No results for input(s): AMMONIA in the last 8760 hours. CBC:  Recent Labs  11/27/16 0444  12/04/16 0441  12/07/16 1044 12/08/16 0447 12/09/16 0416  WBC 16.4*  < > 11.2*  < > 9.4 11.0* 11.3*  NEUTROABS 13.2*  --  8.2*  --  5.4  --   --   HGB 9.2*  < > 7.7*  < > 7.7* 7.9* 7.6*  HCT 27.4*  < > 23.2*  < > 23.8* 24.3* 23.4*  MCV 80.8  < > 80.8  < > 82.4 82.9 83.9  PLT 417*  < > 375  < > 378 374 358  < > = values in this interval not displayed. Cardiac Enzymes:  Recent Labs  11/27/16 0032 11/27/16 0611 11/27/16 1209  TROPONINI 0.15* 0.12* 0.11*   BNP: Invalid input(s): POCBNP CBG:  Recent Labs  12/07/16 1125 12/07/16 1657 12/08/16 0737  GLUCAP 118* 88 71    Radiological Exams: Dg Chest 1 View  Result Date: 12/08/2016 CLINICAL DATA:  Dyspnea, left side chest pain EXAM: CHEST 1 VIEW COMPARISON:  12/02/2016 FINDINGS: Cardiomegaly again noted. Stable right PICC line position with tip in distal SVC. No pulmonary edema. Emphysematous changes bilaterally again noted. Small bilateral pleural effusion with bilateral basilar atelectasis. IMPRESSION: Stable right PICC line position with tip in distal SVC. No pulmonary edema. Emphysematous changes bilaterally again noted. Small bilateral pleural effusion with bilateral basilar atelectasis. Electronically Signed   By: Lahoma Crocker M.D.   On: 12/08/2016 09:32   Dg Abd 1 View  Result Date:  12/06/2016 CLINICAL DATA:  Mid abdominal pain. Recent recurrent small bowel obstruction, with surgery  on 11/24/2016. EXAM: ABDOMEN - 1 VIEW COMPARISON:  11/27/2016 FINDINGS: There are no dilated loops of large or small bowel. No visible free air or free fluid on these supine radiographs. Surgical staples are seen in the midline. No acute bone abnormality. NG tube has been removed. IMPRESSION: No residual dilated small bowel loops.  Benign-appearing abdomen. Electronically Signed   By: Lorriane Shire M.D.   On: 12/06/2016 11:02   Dg Abd 1 View  Result Date: 11/27/2016 CLINICAL DATA:  81 year old female with abdominal pain EXAM: ABDOMEN - 1 VIEW COMPARISON:  abdominal radiograph dated 11/24/2016 FINDINGS: Partially visualized enteric tube in similar position as the prior radiograph and likely in the distal stomach. There is persistent mildly distended air-filled loop of small bowel in the left lower quadrant measuring up to 3.6 cm in diameter similar to the prior radiograph. Air is noted within the colon. No free air identified on the provided image. Right upper quadrant cholecystectomy clips noted. There is degenerative changes of the spine with lower lumbar laminectomy and fusion hardware. There is sclerotic changes of sacroiliitis bilaterally. No acute fracture. Midline vertical anterior pelvic wall surgical staples. The soft tissues are grossly unremarkable. IMPRESSION: 1. Stable positioning of the enteric tube with tip in the distal stomach. 2. Stable mildly dilated air-filled loop of small bowel in the left lower quadrant measuring up to 3.6 cm in diameter. Follow-up recommended. Electronically Signed   By: Anner Crete M.D.   On: 11/27/2016 02:03   Ct Angio Chest Pe W Or Wo Contrast  Result Date: 11/20/2016 CLINICAL DATA:  Acute onset chest pain yesterday. Patient status post laparoscopic cholecystectomy 10/30/2016. EXAM: CT ANGIOGRAPHY CHEST WITH CONTRAST TECHNIQUE: Multidetector CT imaging of the chest was performed using the standard protocol during bolus administration of intravenous contrast.  Multiplanar CT image reconstructions and MIPs were obtained to evaluate the vascular anatomy. CONTRAST:  100 cc Isovue 370. COMPARISON:  PA and lateral chest 10/28/2016. Single-view of the chest 11/20/2016. FINDINGS: Cardiovascular: No pulmonary embolus is identified. Heart size is enlarged. No pericardial effusion. Calcific aortic and coronary atherosclerosis is identified. Mediastinum/Nodes: No enlarged mediastinal, hilar, or axillary lymph nodes. Thyroid gland, trachea, and esophagus demonstrate no significant findings. Lungs/Pleura: No pleural effusion. Lungs demonstrate centrilobular emphysematous disease. Mild dependent atelectasis is seen. No consolidative process, nodule or mass. Upper Abdomen: Negative. Musculoskeletal: No acute or focal bony abnormality. Diffuse subcutaneous edema is seen. Review of the MIP images confirms the above findings. IMPRESSION: Negative for pulmonary embolus or acute disease. Emphysema. Atherosclerosis. Body wall edema compatible with anasarca. Electronically Signed   By: Inge Rise M.D.   On: 11/20/2016 15:50   Ct Abdomen Pelvis W Contrast  Result Date: 11/23/2016 CLINICAL DATA:  Persistent generalized abdominal pain. EXAM: CT ABDOMEN AND PELVIS WITH CONTRAST TECHNIQUE: Multidetector CT imaging of the abdomen and pelvis was performed using the standard protocol following bolus administration of intravenous contrast. CONTRAST:  162mL ISOVUE-300 IOPAMIDOL (ISOVUE-300) INJECTION 61% COMPARISON:  CT scan of November 10, 2016. FINDINGS: Lower chest: No acute abnormality. Hepatobiliary: No focal liver abnormality is seen. Status post cholecystectomy. No biliary dilatation. Pancreas: Unremarkable. No pancreatic ductal dilatation or surrounding inflammatory changes. Spleen: Normal in size without focal abnormality. Adrenals/Urinary Tract: Adrenal glands are unremarkable. No hydronephrosis or renal obstruction is noted. Foley catheter is noted in the urinary bladder.  Stomach/Bowel: Mild small bowel dilatation is noted consistent with ileus or possibly small  bowel obstruction. Vascular/Lymphatic: Aortic atherosclerosis. No enlarged abdominal or pelvic lymph nodes. Reproductive: Uterus and bilateral adnexa are unremarkable. Other: Mild anasarca is noted. No abnormal fluid collection is noted. Musculoskeletal: Status post surgical posterior fusion extending from L2-L5. IMPRESSION: Aortic atherosclerosis. Mild anasarca. Stable mild small bowel dilatation is noted consistent with ileus or possible small bowel obstruction. Electronically Signed   By: Marijo Conception, M.D.   On: 11/23/2016 17:13   Dg Chest Port 1 View  Result Date: 12/02/2016 CLINICAL DATA:  Shortness of breath. EXAM: PORTABLE CHEST 1 VIEW COMPARISON:  11/29/2016 cavoatrial junction. FINDINGS: Right arm PICC line tip is in the projection of the cavoatrial junction. Mild cardiac enlargement. Pleural effusions are less apparent on today's study. No interstitial edema. IMPRESSION: 1. Decrease in appearance of bilateral pleural effusions. 2. No new findings. Electronically Signed   By: Kerby Moors M.D.   On: 12/02/2016 11:21   Dg Chest Port 1 View  Result Date: 11/29/2016 CLINICAL DATA:  Dyspnea EXAM: PORTABLE CHEST 1 VIEW COMPARISON:  Chest CT from 2 days ago FINDINGS: Stable borderline heart size and unremarkable mediastinal contours when accounting for distortion by rotation. Right upper extremity PICC with tip at the SVC level. Haziness of the lower chest from pleural effusions with mild atelectasis. No Kerley lines. No pneumothorax. No air bronchograms. IMPRESSION: Small pleural effusions and mild atelectasis at the bases, similar in chest CT 2 days ago. Electronically Signed   By: Monte Fantasia M.D.   On: 11/29/2016 12:38   Dg Chest Port 1v Same Day  Result Date: 11/20/2016 CLINICAL DATA:  Chest pain EXAM: PORTABLE CHEST 1 VIEW COMPARISON:  November 03, 2016 FINDINGS: Nasogastric tube tip and side  port are below the diaphragm. Central catheter tip is in the superior vena cava. No pneumothorax. There is no edema or consolidation. Heart size and pulmonary vascularity are normal. No adenopathy. IMPRESSION: Tube and catheter positions as described. No evident pneumothorax. No edema or consolidation. Electronically Signed   By: Lowella Grip III M.D.   On: 11/20/2016 09:03   Dg Abd 2 Views  Result Date: 11/12/2016 CLINICAL DATA:  Generalized abdominal pain since cholecystostomy 10/30/2016. Concern for ileus EXAM: ABDOMEN - 2 VIEW COMPARISON:  Abdominal CT from 2 days ago FINDINGS: Unchanged small bowel distention, out of proportion to colonic diameter. Small bowel loops in the left abdomen measure up to 35 mm. No evidence of pneumoperitoneum. No concerning mass effect or gas collection. Cholecystectomy clips. Spinal fixation hardware. Clear lung bases. IMPRESSION: Unchanged small bowel dilatation. Postoperative status favors ileus, although there is notable lack of colonic involvement. No pneumoperitoneum. Electronically Signed   By: Monte Fantasia M.D.   On: 11/12/2016 20:54   Dg Abd Portable 1v  Result Date: 11/24/2016 CLINICAL DATA:  Location of NG tube. Nausea. Status post cholecystectomy on 10/30/2016 with exploratory laparotomy for removal of adhesions on 11/17/2016 and redo exploratory laparatomy today. EXAM: PORTABLE ABDOMEN - 1 VIEW COMPARISON:  11/16/2016 FINDINGS: The tip and side port of a gastric tube is seen below the left hemidiaphragm in the expected location of the body of the stomach with tip crossing midline reports the right lower quadrant. Mild scattered gas containing small and large bowel loops are noted. Postsurgical clips are seen overlying the pelvis. Patient status post multilevel lumbar spinal fusion. IMPRESSION: The tip and side hole of a gastric tube are in the expected location of the gastric antrum and body. These results will be called to the ordering clinician or  representative by the Radiologist Assistant, and communication documented in the PACS or zVision Dashboard. Electronically Signed   By: Ashley Royalty M.D.   On: 11/24/2016 19:55   Dg Abd Portable 1v  Result Date: 11/16/2016 CLINICAL DATA:  Ileus following cholecystectomy. Severe lower abdominal pain. EXAM: PORTABLE ABDOMEN - 1 VIEW COMPARISON:  11/14/2016 FINDINGS: Mild gaseous distention of bowel loops in the abdomen and pelvis, predominantly small bowel loops. Findings are similar to prior study. No free air organomegaly. Prior cholecystectomy. IMPRESSION: Continued bowel dilatation in the abdomen and pelvis, predominantly small bowel. Findings could reflect small bowel obstruction or ileus. Electronically Signed   By: Rolm Baptise M.D.   On: 11/16/2016 09:12   Dg Abd Portable 1v  Result Date: 11/14/2016 CLINICAL DATA:  Diffuse abdominal pain and distension EXAM: PORTABLE ABDOMEN - 1 VIEW COMPARISON:  Supine and left-side-down decubitus radiographs of November 12, 2016 FINDINGS: There remain loops of moderately distended gas-filled small bowel in the mid abdomen. Only a small amount of gas is noted within a normal calibered colon. There surgical clips in the gallbladder fossa. The patient has undergone posterior and interbody fusion from L3 through S1. There are phleboliths within the pelvis. IMPRESSION: Findings compatible with a mid to distal small bowel obstruction. There is no evidence of perforation. The volume of small bowel gas has not significantly changed since the study of 2 days ago. Electronically Signed   By: David  Martinique M.D.   On: 11/14/2016 08:24   Ct Angio Chest/abd/pel For Dissection W And/or W/wo  Result Date: 11/27/2016 CLINICAL DATA:  81 year old female with chest and abdominal pain. EXAM: CT ANGIOGRAPHY CHEST, ABDOMEN AND PELVIS TECHNIQUE: Multidetector CT imaging through the chest, abdomen and pelvis was performed using the standard protocol during bolus administration of intravenous  contrast. Multiplanar reconstructed images and MIPs were obtained and reviewed to evaluate the vascular anatomy. CONTRAST:  100 cc Isovue 370 COMPARISON:  Abdominal CT 11/23/2016 FINDINGS: CTA CHEST FINDINGS Cardiovascular: There is mild cardiomegaly with biatrial dilatation. Multi vessel coronary vascular disease as well as calcification of the mitral annulus noted. There is a retrograde flow of contrast from the right atrium into the IVC compatible with a degree of right cardiac dysfunction. No pericardial effusion. There is atherosclerotic calcification of the thoracic aorta and the origins of the great vessels of the aortic arch. There is common origin of the left common carotid artery and right brachiocephalic trunk. No aortic aneurysm or dissection. Evaluation of the pulmonary arteries is limited due to suboptimal opacification of the peripheral branches as well as respiratory motion artifact. No definite central pulmonary artery embolus identified. V/Q scan may provide better evaluation if there is high clinical concern for pulmonary embolism. Mediastinum/Nodes: There is no hilar or mediastinal adenopathy. An enteric tube is noted within the esophagus. Lungs/Pleura: There is emphysematous changes of the lungs. There are small bilateral pleural effusions, new from prior CT of 11/20/2016. Subsegmental consolidative changes of the lung bases noted which may be related to compressive atelectasis or pneumonia. No pneumothorax. The central airways are patent. Musculoskeletal: Top-normal bilateral axillary lymph nodes of indeterminate etiology, possibly reactive. Clinical correlation is recommended. There is diffuse subcutaneous soft tissue edema and anasarca. No fluid collection. There is degenerative changes of the spine. No acute fracture. Review of the MIP images confirms the above findings. CTA ABDOMEN AND PELVIS FINDINGS VASCULAR Evaluation is limited due to respiratory motion artifact as well as streak  artifact caused by metallic spinal hardware. Aorta: Advanced aortoiliac atherosclerotic  disease. No aneurysmal dilatation or evidence of dissection Celiac: There is atherosclerotic calcification of the origin of the celiac axis with high-grade inclusion of the vessel. Only a small amount of contrast noted in the proximal portion of the celiac axis. SMA: Advanced atherosclerotic calcification of the origin of the SMA with diminished flow within the vessel. Renals: There is atherosclerotic calcification of the ostia of the renal arteries. The renal arteries however remain patent. IMA: There is atherosclerotic calcification of the origin of the IMA. The IMA remain patent. Inflow: The iliac arteries remain patent. No aneurysmal dilatation for dissection. Veins: No obvious venous abnormality within the limitations of this arterial phase study. Review of the MIP images confirms the above findings. NON-VASCULAR Evaluation of the structures is very limited due to anasarca and artifact caused by metallic spinal hardware. Small pocket of extraluminal air in the anterior pelvis (series 10 image 144) is likely related to recent surgery. Small pocket of air noted along the midline vertical surgical incision. There is a small ascites and diffuse mesenteric edema. Hepatobiliary: Cholecystectomy. The liver is grossly unremarkable with limited evaluation. Pancreas: There is limited visualization of the pancreas. The pancreas is grossly unremarkable. Spleen: Grossly unremarkable on limited evaluation. Adrenals/Urinary Tract: The left adrenal gland appears unremarkable. The right adrenal gland is not well visualized. There is no hydronephrosis on either side. The urinary bladder is partially decompressed around a Foley catheter. Stomach/Bowel: Evaluation of the bowel is very limited in the absence of oral contrast. An enteric feeding extends into distal stomach. Multiple top-normal caliber fluid-filled loops of small bowel throughout  the abdomen may represent postsurgical ileus. Thickened appearance of loops of small bowel in the lower abdomen and pelvis likely related to underdistention and ascites. Enteritis is not excluded. Lymphatic: No adenopathy. Reproductive: The uterus is not well visualized and limited pain evaluation. The uterus appears heterogeneous and probably contains a fibroid. Other: There is diffuse subcutaneous edema and anasarca. Midline vertical anterior pelvic wall incisional scar noted. Small amount of fluid noted along the surgical incision. A small pockets of air is also seen in the subcutaneous soft tissues of the midline anterior abdomen along the surgical incision. Musculoskeletal: Multilevel degenerative changes of the spine. Lower lumbar laminectomy and posterior fusion hardware. No acute fracture. Review of the MIP images confirms the above findings. IMPRESSION: 1. Emphysema with interval development of small bilateral pleural effusions and bibasilar subsegmental atelectasis versus less likely infiltrate. 2. Cardiomegaly with biatrial dilatation and evidence of right cardiac dysfunction. Correlation with echocardiogram recommended. 3. Small ascites, diffuse subcutaneous soft tissue edema and anasarca. 4. 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 Evaluation of the vessels is very limited due to artifact. There is advanced aortoiliac atherosclerotic disease. 5. Mildly dilated fluid-filled loops of small bowel elbow likely related to ileus. Overall there has been interval improvement in the degree of bowel dilatation compared to the prior CT. Clinical correlation and follow-up recommended. Thickened appearing loops of small bowel likely related to under distention and ascites. Enteritis is less likely. 6. Postsurgical changes with focal small pockets of extraluminal air. No drainable fluid collection/abscess. Electronically Signed   By: Anner Crete M.D.   On: 11/27/2016  05:35    Assessment/Plan  Generalized weakness From physical deconditioning. Will have her work with physical therapy and occupational therapy team to help with gait training and muscle strengthening exercises.fall precautions. Skin care. Encourage to be out of bed.   Nausea and vomiting  Concern with recent recurrent small bowel obstruction. Will change her diet to clear liquid for 24 hrs for now and advance to mechanical soft diet if tolerated. Avoid fatty meal given her being s/p cholecystectomy. D/c prostat. Reviewed care plan with RD and recommend ensure clear for supplement. Pending cbc and cmp from today. Will consider iv fluids with dextrose if lyte abnormality noted. Monitor for hematemesis. If this worsens or patient is unable to tolerate po feed, consider sending to ED for further evaluation. Currently on Phenergan when necessary and Zofran when necessary for nausea. Advised to provide Zofran half an hour prior to meals to see if this help her tolerate her oral feeds better.  Small bowel obstruction S/p ex lap and lysis of adhesion and reduction of volvulus. Will need follow up with surgery. She has been having bowel movement. Monitor for worsening of diarrhea.   Necrotic cholecytitis S/p lap cholecystectomy 1/22 and will need f/u with general surgery. Avoid fatty meal monitor appetite and bowel movement  Poor appetite Currently on Remeron 15 mg daily, encourage oral intake as tolerated. Monitor weekly Weight for now. RD on board.   Blood loss anemia Status post multiple surgical procedures in the hospital. Currently on iron supplement. Check CBC  Stage 3 pressure ulcer  To left buttock. Cleanse area wit normal saline and apply hydrogel, collagen nad foam dressign q 3 days. Pressure ulcer prophylaxis. Low air loss mattress  GERD Currently on omeprazole 40 mg daily. Given her ongoing nausea and vomiting, change her omeprazole to 40 mg twice a day to prevent gastric ulcer. Monitor  for hematemesis.  HTN Monitor blood pressure readings every shift. Continue losartan 50 mg daily, chlorthalidone 50 mg daily, Norvasc 10 mg daily  for now with Coreg 12.5 mg twice a day and hydralazine 50 mg 4 times a day. Continue clonidine 0.1 mg daily. Check BMP. With her being on multiple BP medication, add holding parameters to avoid hypotension   Goals of care: short term rehabilitation   Labs/tests ordered: cbc, cmp pending   Family/ staff Communication: reviewed care plan with patient, her daughter, charge nurse and nursing supervisor    Blanchie Serve, MD Internal Medicine [place] [place], [place] [address] Cell Phone (Monday-Friday 8 am - 5 pm): [phone] On Call: [phone] and follow prompts after 5 pm and on weekends Office Phone: [phone] Office Fax: [phone]

## 2016-12-12 NOTE — ED Provider Notes (Signed)
Emergency Department Provider Note   I have reviewed the triage vital signs and the nursing notes.   HISTORY  Chief Complaint Emesis   HPI Melanie [name] is a 81 y.o. female patient with past medical history of COPD, HLD, HTN, and recent lap/chole with recurrent SBO presents to the emergency department for evaluation of diffuse abdominal pain, nausea, vomiting. Patient states that she has symptoms that are worse with eating. She denies any chest pain or difficulty breathing. She continues to have bowel movements and pass gas. Her emesis is nonbloody. Symptoms are severe and gradually worsening. She also notes some mild dyspnea but denies CP.    Past Medical History:  Diagnosis Date  . Anxiousness   . COPD (chronic obstructive pulmonary disease) ([place])   . Hyperlipidemia   . Hypertension   . Small bowel obstruction due to adhesions 11/17/2016  . Uterine fibroid     Patient Active Problem List   Diagnosis Date Noted  . Aspiration pneumonia ([place]) 12/13/2016  . Nausea vomiting and diarrhea 12/13/2016  . Sepsis ([place]) 12/13/2016  . Acute encephalopathy 12/13/2016  . Diarrhea   . Healthcare-associated pneumonia   . Pressure injury of skin 11/28/2016  . Anxiousness   . H/O exploratory laparotomy 11/24/2016  . Chest pain   . Small bowel obstruction s/p ex lap & lysis of adhesions 11/17/2016 11/17/2016  . Necrotic cholecystitis s/p lap cholecystectomy 10/30/2016 11/09/2016  . GERD (gastroesophageal reflux disease) 11/09/2016  . Essential hypertension   . Hyperlipidemia   . COPD (chronic obstructive pulmonary disease) ([place])   . Generalized abdominal pain 10/28/2016    Past Surgical History:  Procedure Laterality Date  . BACK SURGERY     X2  . bone removed from skull Right 1957  . CHOLECYSTECTOMY N/A 10/30/2016   Procedure: LAPAROSCOPIC CHOLECYSTECTOMY WITH INTRAOPERATIVE CHOLANGIOGRAM;  Surgeon: Johnathan Hausen, MD;  Location: [facility];  Service: General;  Laterality: N/A;  . JOINT  REPLACEMENT     knee  . LAPAROTOMY N/A 11/17/2016   Procedure: EXPLORATORY LAPAROTOMY WITH LYSIS OF ADHESIONS;  Surgeon: Fanny Skates, MD;  Location: [facility];  Service: General;  Laterality: N/A;  . LAPAROTOMY N/A 11/24/2016   Procedure: RE DO EXPLORATORY LAPAROTOMY;  Surgeon: Jackolyn Confer, MD;  Location: [facility];  Service: General;  Laterality: N/A;      Allergies Nsaids and Ace inhibitors  Family History  Problem Relation Age of Onset  . Heart disease Mother     before age 50    Social History Social History  Substance Use Topics  . Smoking status: Former Smoker    Quit date: 09/08/1998  . Smokeless tobacco: Never Used  . Alcohol use 0.0 oz/week     Comment: occasional    Review of Systems  Constitutional: No fever/chills Eyes: No visual changes. ENT: No sore throat. Cardiovascular: Denies chest pain. Respiratory: Positive shortness of breath. Gastrointestinal: No abdominal pain.  Positive nausea and vomiting.  No diarrhea.  No constipation. Genitourinary: Negative for dysuria. Musculoskeletal: Negative for back pain. Skin: Negative for rash. Neurological: Negative for headaches, focal weakness or numbness.  10-point ROS otherwise negative.  ____________________________________________   PHYSICAL EXAM:  VITAL SIGNS: ED Triage Vitals  Enc Vitals Group     BP 12/12/16 2030 163/58     Pulse Rate 12/12/16 2030 96     Resp 12/12/16 2030 18     Temp 12/12/16 2030 98.9 F (37.2 C)     Temp Source 12/12/16 2030 Oral  SpO2 12/12/16 2027 98 %     Weight 12/12/16 2031 181 lb (82.1 kg)     Height 12/12/16 2031 5\' 2"  (1.575 m)     Pain Score 12/12/16 2032 5   Constitutional: Alert and oriented. Appears weak and chronically ill. No acute distress.  Eyes: Conjunctivae are normal.  Head: Atraumatic. Nose: No congestion/rhinnorhea. Mouth/Throat: Mucous membranes are dry. Neck: No stridor.   Cardiovascular: Mild tachycardia. Good peripheral circulation. Grossly  normal heart sounds.   Respiratory: Normal respiratory effort.  No retractions. Lungs with crackles at the bases bilaterally. . Gastrointestinal: Soft with diffuse mild tenderness. No distention.  Musculoskeletal: No lower extremity tenderness nor edema. No gross deformities of extremities. Neurologic:  Normal speech and language. No gross focal neurologic deficits are appreciated.  Skin:  Skin is warm, dry and intact. No rash noted. Psychiatric: Mood and affect are normal. Speech and behavior are normal.  ____________________________________________   LABS (all labs ordered are listed, but only abnormal results are displayed)  Labs Reviewed  COMPREHENSIVE METABOLIC PANEL - Abnormal; Notable for the following:       Result Value   Creatinine, Ser 1.03 (*)    Calcium 7.8 (*)    Albumin 2.5 (*)    Total Bilirubin 1.5 (*)    GFR calc non Af Amer 51 (*)    GFR calc Af Amer 59 (*)    All other components within normal limits  CBC WITH DIFFERENTIAL/PLATELET - Abnormal; Notable for the following:    WBC 18.1 (*)    RBC 3.30 (*)    Hemoglobin 8.9 (*)    HCT 27.2 (*)    RDW 19.0 (*)    Neutro Abs 14.0 (*)    Monocytes Absolute 1.8 (*)    All other components within normal limits  URINALYSIS, ROUTINE W REFLEX MICROSCOPIC - Abnormal; Notable for the following:    Ketones, ur 80 (*)    All other components within normal limits  MRSA PCR SCREENING  CULTURE, BLOOD (ROUTINE X 2)  CULTURE, BLOOD (ROUTINE X 2)  CULTURE, EXPECTORATED SPUTUM-ASSESSMENT  GRAM STAIN  C DIFFICILE QUICK SCREEN W PCR REFLEX  LIPASE, BLOOD  LACTIC ACID, PLASMA  PROCALCITONIN  STREP PNEUMONIAE URINARY ANTIGEN  LEGIONELLA PNEUMOPHILA SEROGP 1 UR AG  I-STAT CG4 LACTIC ACID, ED  I-STAT TROPOININ, ED   ____________________________________________  EKG   EKG Interpretation  Date/Time:  Tuesday December 12 2016 22:19:22 EST Ventricular Rate:  92 PR Interval:    QRS Duration: 94 QT Interval:  364 QTC  Calculation: 451 R Axis:   32 Text Interpretation:  Sinus rhythm Borderline short PR interval Probable left atrial enlargement Borderline repolarization abnormality No STEMI. Similar to prior tracing.  Confirmed by Trysten Bernard MD, Cj Beecher [id number]) on 12/12/2016 11:30:18 PM       ____________________________________________  RADIOLOGY  Dg Chest 2 View  Result Date: 12/12/2016 CLINICAL DATA:  Nausea vomiting and abdominal pain EXAM: CHEST  2 VIEW COMPARISON:  12/08/2016 FINDINGS: Right upper extremity catheter has been removed. There are tiny bilateral effusions. No focal consolidation. Stable mild cardiomegaly. No pneumothorax. Partially visualized hardware within the spine. IMPRESSION: 1. Removal of right upper extremity catheter 2. Small bilateral effusions. Improved aeration at the bilateral lung bases 3. Mild cardiomegaly Electronically Signed   By: Donavan Foil M.D.   On: 12/12/2016 22:32   Ct Abdomen Pelvis W Contrast  Result Date: 12/13/2016 CLINICAL DATA:  Vomiting EXAM: CT ABDOMEN AND PELVIS WITH CONTRAST TECHNIQUE: Multidetector CT imaging of the  abdomen and pelvis was performed using the standard protocol following bolus administration of intravenous contrast. CONTRAST:  173mL ISOVUE-300 IOPAMIDOL (ISOVUE-300) INJECTION 61% COMPARISON:  12/06/2016, CT 11/23/2016 FINDINGS: Lower chest: Consolidation within the right lower lobe. Small bilateral pleural effusions. Dense mitral calcifications. Borderline to mild cardiomegaly. Hepatobiliary: No biliary dilatation. Surgical clips at the gallbladder fossa. Fluid density vague lesion adjacent to or within lateral segment of left hepatic lobe. Pancreas: Unremarkable. No pancreatic ductal dilatation or surrounding inflammatory changes. Spleen: Normal in size without focal abnormality. Adrenals/Urinary Tract: Adrenal glands are within normal limits. Kidneys show no hydronephrosis. The bladder is normal. Stomach/Bowel: The stomach is nonenlarged. There is no  dilated small bowel. No significant colon wall thickening. Vascular/Lymphatic: Atherosclerosis. Non aneurysmal aorta. No grossly enlarged lymph nodes Reproductive: Enhancing masses within the uterus with partial calcification, consistent with fibroids. No adnexal masses. Other: No free air. Trace free fluid in the pelvis. Diffuse subcutaneous edema. Ventral postsurgical scarring. Musculoskeletal: No acute osseous abnormality. Postsurgical changes of the lumbar spine. IMPRESSION: 1. Small bilateral pleural effusions. There is dense consolidation in the right lower lobe which may reflect atelectasis, pneumonia or aspiration 2. There is no evidence for small bowel dilatation or evidence of a small bowel obstruction. 3. Small free fluid in the pelvis 4. Enhancing uterine masses suggesting fibroids. Electronically Signed   By: Donavan Foil M.D.   On: 12/13/2016 00:02    ____________________________________________   PROCEDURES  Procedure(s) performed:   Procedures  None ____________________________________________   INITIAL IMPRESSION / ASSESSMENT AND PLAN / ED COURSE  Pertinent labs & imaging results that were available during my care of the patient were reviewed by me and considered in my medical decision making (see chart for details).  Patient presents to the emergency department for evaluation of diffuse abdominal pain, vomiting, nausea. Patient recently was hospitalized for abdominal pain and underwent a laparoscopic cholecystectomy and subsequently developed a small bowel obstruction which required additional operative intervention. She denies any fever. She has some mild dyspnea. Plan for chest x-ray given her mild dyspnea and CT scan of the abdomen and pelvis given her multiple presentations of small bowel obstruction. Will treat symptoms conservatively at this time and reassess.   11:15 PM Patient with multiple loose stools. She had a negative c. Diff earlier this month so did not send  additional testing. Labs significant for leukocytosis. CT pending. CXR unremarkable.   12:09 AM CT with no acute intrabdominal process. Some concern PNA, possibly aspiration with emesis. Plan for admission given dyspnea, diarrhea, and generalized weakness.   Discussed patient's case with hospitalist, Dr. Blaine Hamper. Patient and family (if present) updated with plan. Care transferred to hospitalist service.  I reviewed all nursing notes, vitals, pertinent old records, EKGs, labs, imaging (as available).  ____________________________________________  FINAL CLINICAL IMPRESSION(S) / ED DIAGNOSES  Final diagnoses:  Generalized abdominal pain  Diarrhea, unspecified type  Dyspnea, unspecified type  Healthcare-associated pneumonia     MEDICATIONS GIVEN DURING THIS VISIT:  Medications  mirtazapine (REMERON SOL-TAB) disintegrating tablet 15 mg (15 mg Oral Given 12/13/16 0358)  acetaminophen (TYLENOL) tablet 650 mg (not administered)  oxyCODONE (Oxy IR/ROXICODONE) immediate release tablet 5-10 mg (not administered)  amLODipine (NORVASC) tablet 10 mg (10 mg Oral Given 12/13/16 0949)  aspirin EC tablet 81 mg (81 mg Oral Given 12/13/16 0949)  carvedilol (COREG) tablet 12.5 mg (12.5 mg Oral Given 12/13/16 0949)  hydrALAZINE (APRESOLINE) tablet 50 mg (50 mg Oral Given 12/13/16 0949)  losartan (COZAAR) tablet 50 mg (50 mg  Oral Given 12/13/16 0950)  multivitamins with iron tablet 1 tablet (1 tablet Oral Given 12/13/16 0950)  pantoprazole (PROTONIX) EC tablet 40 mg (40 mg Oral Given 12/13/16 0949)  cloNIDine (CATAPRES) tablet 0.1 mg (0.1 mg Oral Given 12/13/16 0950)  ondansetron (ZOFRAN) injection 4 mg (not administered)  hydrALAZINE (APRESOLINE) injection 5 mg (not administered)  albuterol (PROVENTIL) (2.5 MG/3ML) 0.083% nebulizer solution 2.5 mg (not administered)  piperacillin-tazobactam (ZOSYN) IVPB 3.375 g (3.375 g Intravenous Given 12/13/16 0948)  enoxaparin (LOVENOX) injection 40 mg (40 mg Subcutaneous Given 12/13/16  0950)  azithromycin (ZITHROMAX) 500 mg in dextrose 5 % 250 mL IVPB (500 mg Intravenous Given 12/13/16 0356)  0.9 %  sodium chloride infusion ( Intravenous New Bag/Given 12/13/16 0355)  vancomycin (VANCOCIN) IVPB 750 mg/150 ml premix (not administered)  morphine 4 MG/ML injection 1 mg (not administered)  sodium chloride 0.9 % bolus 500 mL (0 mLs Intravenous Stopped 12/13/16 0017)  promethazine (PHENERGAN) injection 12.5 mg (12.5 mg Intravenous Given 12/12/16 2152)  iopamidol (ISOVUE-300) 61 % injection 100 mL (100 mLs Intravenous Contrast Given 12/12/16 2259)  vancomycin (VANCOCIN) IVPB 1000 mg/200 mL premix (1,000 mg Intravenous New Bag/Given 12/13/16 0102)     NEW OUTPATIENT MEDICATIONS STARTED DURING THIS VISIT:  None   Note:  This document was prepared using Dragon voice recognition software and may include unintentional dictation errors.  Nanda Quinton, MD Emergency Medicine   Margette Fast, MD 12/13/16 1101

## 2016-12-12 NOTE — ED Notes (Signed)
Patient transported to CT 

## 2016-12-12 NOTE — ED Notes (Signed)
Bed: WA20 Expected date:  Expected time:  Means of arrival:  Comments: 81 year old abnormal labs, dehydration

## 2016-12-13 ENCOUNTER — Inpatient Hospital Stay (HOSPITAL_COMMUNITY): Payer: Medicare Other

## 2016-12-13 ENCOUNTER — Encounter (HOSPITAL_COMMUNITY): Payer: Self-pay | Admitting: *Deleted

## 2016-12-13 DIAGNOSIS — L89152 Pressure ulcer of sacral region, stage 2: Secondary | ICD-10-CM | POA: Diagnosis present

## 2016-12-13 DIAGNOSIS — R1084 Generalized abdominal pain: Secondary | ICD-10-CM

## 2016-12-13 DIAGNOSIS — J690 Pneumonitis due to inhalation of food and vomit: Secondary | ICD-10-CM

## 2016-12-13 DIAGNOSIS — J449 Chronic obstructive pulmonary disease, unspecified: Secondary | ICD-10-CM | POA: Diagnosis present

## 2016-12-13 DIAGNOSIS — Z79899 Other long term (current) drug therapy: Secondary | ICD-10-CM | POA: Diagnosis not present

## 2016-12-13 DIAGNOSIS — Z87891 Personal history of nicotine dependence: Secondary | ICD-10-CM | POA: Diagnosis not present

## 2016-12-13 DIAGNOSIS — F329 Major depressive disorder, single episode, unspecified: Secondary | ICD-10-CM | POA: Diagnosis present

## 2016-12-13 DIAGNOSIS — A419 Sepsis, unspecified organism: Secondary | ICD-10-CM | POA: Diagnosis present

## 2016-12-13 DIAGNOSIS — J189 Pneumonia, unspecified organism: Secondary | ICD-10-CM | POA: Insufficient documentation

## 2016-12-13 DIAGNOSIS — R4182 Altered mental status, unspecified: Secondary | ICD-10-CM | POA: Diagnosis present

## 2016-12-13 DIAGNOSIS — G9340 Encephalopathy, unspecified: Secondary | ICD-10-CM | POA: Diagnosis present

## 2016-12-13 DIAGNOSIS — E876 Hypokalemia: Secondary | ICD-10-CM | POA: Diagnosis not present

## 2016-12-13 DIAGNOSIS — I10 Essential (primary) hypertension: Secondary | ICD-10-CM | POA: Diagnosis present

## 2016-12-13 DIAGNOSIS — Z6829 Body mass index (BMI) 29.0-29.9, adult: Secondary | ICD-10-CM | POA: Diagnosis not present

## 2016-12-13 DIAGNOSIS — G8929 Other chronic pain: Secondary | ICD-10-CM | POA: Diagnosis present

## 2016-12-13 DIAGNOSIS — K219 Gastro-esophageal reflux disease without esophagitis: Secondary | ICD-10-CM | POA: Diagnosis present

## 2016-12-13 DIAGNOSIS — R112 Nausea with vomiting, unspecified: Secondary | ICD-10-CM | POA: Diagnosis not present

## 2016-12-13 DIAGNOSIS — G9341 Metabolic encephalopathy: Secondary | ICD-10-CM | POA: Diagnosis present

## 2016-12-13 DIAGNOSIS — E785 Hyperlipidemia, unspecified: Secondary | ICD-10-CM | POA: Diagnosis present

## 2016-12-13 DIAGNOSIS — Y95 Nosocomial condition: Secondary | ICD-10-CM | POA: Diagnosis present

## 2016-12-13 DIAGNOSIS — R197 Diarrhea, unspecified: Secondary | ICD-10-CM | POA: Diagnosis not present

## 2016-12-13 DIAGNOSIS — E43 Unspecified severe protein-calorie malnutrition: Secondary | ICD-10-CM | POA: Insufficient documentation

## 2016-12-13 DIAGNOSIS — Z96659 Presence of unspecified artificial knee joint: Secondary | ICD-10-CM | POA: Diagnosis present

## 2016-12-13 DIAGNOSIS — Z7982 Long term (current) use of aspirin: Secondary | ICD-10-CM | POA: Diagnosis not present

## 2016-12-13 DIAGNOSIS — Z66 Do not resuscitate: Secondary | ICD-10-CM | POA: Diagnosis not present

## 2016-12-13 DIAGNOSIS — E860 Dehydration: Secondary | ICD-10-CM | POA: Diagnosis present

## 2016-12-13 DIAGNOSIS — Z515 Encounter for palliative care: Secondary | ICD-10-CM | POA: Diagnosis not present

## 2016-12-13 DIAGNOSIS — Z7189 Other specified counseling: Secondary | ICD-10-CM | POA: Diagnosis not present

## 2016-12-13 LAB — ALDOSTERONE + RENIN ACTIVITY W/ RATIO: Aldosterone: 4.8 ng/dL (ref 0.0–30.0)

## 2016-12-13 LAB — PROCALCITONIN: PROCALCITONIN: 0.18 ng/mL

## 2016-12-13 LAB — STREP PNEUMONIAE URINARY ANTIGEN: Strep Pneumo Urinary Antigen: NEGATIVE

## 2016-12-13 LAB — MRSA PCR SCREENING: MRSA BY PCR: NEGATIVE

## 2016-12-13 LAB — LACTIC ACID, PLASMA: LACTIC ACID, VENOUS: 0.8 mmol/L (ref 0.5–1.9)

## 2016-12-13 MED ORDER — TAB-A-VITE/IRON PO TABS
1.0000 | ORAL_TABLET | Freq: Every day | ORAL | Status: DC
  Administered 2016-12-13 – 2016-12-14 (×2): 1 via ORAL
  Filled 2016-12-13 (×4): qty 1

## 2016-12-13 MED ORDER — SODIUM CHLORIDE 0.9 % IV SOLN
INTRAVENOUS | Status: DC
  Administered 2016-12-13 – 2016-12-15 (×3): via INTRAVENOUS

## 2016-12-13 MED ORDER — MIRTAZAPINE 15 MG PO TBDP
15.0000 mg | ORAL_TABLET | Freq: Every day | ORAL | Status: DC
  Administered 2016-12-13 – 2016-12-16 (×5): 15 mg via ORAL
  Filled 2016-12-13 (×5): qty 1

## 2016-12-13 MED ORDER — PIPERACILLIN-TAZOBACTAM 3.375 G IVPB
3.3750 g | Freq: Three times a day (TID) | INTRAVENOUS | Status: DC
  Administered 2016-12-13 (×2): 3.375 g via INTRAVENOUS
  Filled 2016-12-13 (×2): qty 50

## 2016-12-13 MED ORDER — DEXTROSE 5 % IV SOLN
2.0000 g | Freq: Once | INTRAVENOUS | Status: DC
  Filled 2016-12-13: qty 2

## 2016-12-13 MED ORDER — SODIUM CHLORIDE 0.9 % IV SOLN
3.0000 g | Freq: Four times a day (QID) | INTRAVENOUS | Status: DC
  Administered 2016-12-13 – 2016-12-16 (×10): 3 g via INTRAVENOUS
  Filled 2016-12-13 (×13): qty 3

## 2016-12-13 MED ORDER — DEXTROSE 5 % IV SOLN
500.0000 mg | INTRAVENOUS | Status: DC
  Administered 2016-12-13: 500 mg via INTRAVENOUS
  Filled 2016-12-13: qty 500

## 2016-12-13 MED ORDER — OXYCODONE HCL 5 MG PO TABS
5.0000 mg | ORAL_TABLET | ORAL | Status: DC | PRN
  Administered 2016-12-13 – 2016-12-16 (×9): 10 mg via ORAL
  Filled 2016-12-13 (×9): qty 2

## 2016-12-13 MED ORDER — HYDRALAZINE HCL 50 MG PO TABS
50.0000 mg | ORAL_TABLET | Freq: Four times a day (QID) | ORAL | Status: DC
  Administered 2016-12-13 – 2016-12-17 (×15): 50 mg via ORAL
  Filled 2016-12-13 (×16): qty 1

## 2016-12-13 MED ORDER — LOSARTAN POTASSIUM 50 MG PO TABS
50.0000 mg | ORAL_TABLET | Freq: Every day | ORAL | Status: DC
  Administered 2016-12-13 – 2016-12-16 (×4): 50 mg via ORAL
  Filled 2016-12-13 (×4): qty 1

## 2016-12-13 MED ORDER — ONDANSETRON HCL 4 MG/2ML IJ SOLN
4.0000 mg | Freq: Three times a day (TID) | INTRAMUSCULAR | Status: DC | PRN
  Administered 2016-12-14 – 2016-12-16 (×5): 4 mg via INTRAVENOUS
  Filled 2016-12-13 (×5): qty 2

## 2016-12-13 MED ORDER — VANCOMYCIN HCL IN DEXTROSE 1-5 GM/200ML-% IV SOLN
1000.0000 mg | Freq: Once | INTRAVENOUS | Status: AC
  Administered 2016-12-13: 1000 mg via INTRAVENOUS
  Filled 2016-12-13: qty 200

## 2016-12-13 MED ORDER — ENOXAPARIN SODIUM 40 MG/0.4ML ~~LOC~~ SOLN
40.0000 mg | SUBCUTANEOUS | Status: DC
  Administered 2016-12-13 – 2016-12-16 (×4): 40 mg via SUBCUTANEOUS
  Filled 2016-12-13 (×4): qty 0.4

## 2016-12-13 MED ORDER — MORPHINE SULFATE (PF) 4 MG/ML IV SOLN
1.0000 mg | INTRAVENOUS | Status: DC | PRN
  Administered 2016-12-14 – 2016-12-16 (×6): 1 mg via INTRAVENOUS
  Filled 2016-12-13 (×6): qty 1

## 2016-12-13 MED ORDER — MUPIROCIN 2 % EX OINT: 1.0000 "application " | TOPICAL_OINTMENT | Freq: Two times a day (BID) | CUTANEOUS | Status: DC

## 2016-12-13 MED ORDER — ASPIRIN EC 81 MG PO TBEC
81.0000 mg | DELAYED_RELEASE_TABLET | Freq: Every day | ORAL | Status: DC
  Administered 2016-12-13 – 2016-12-16 (×4): 81 mg via ORAL
  Filled 2016-12-13 (×4): qty 1

## 2016-12-13 MED ORDER — VANCOMYCIN HCL IN DEXTROSE 750-5 MG/150ML-% IV SOLN
750.0000 mg | Freq: Two times a day (BID) | INTRAVENOUS | Status: DC
  Filled 2016-12-13: qty 150

## 2016-12-13 MED ORDER — AMLODIPINE BESYLATE 5 MG PO TABS
10.0000 mg | ORAL_TABLET | Freq: Every day | ORAL | Status: DC
  Administered 2016-12-13 – 2016-12-17 (×5): 10 mg via ORAL
  Filled 2016-12-13 (×5): qty 2

## 2016-12-13 MED ORDER — ALBUTEROL SULFATE (2.5 MG/3ML) 0.083% IN NEBU: 2.5000 mg | INHALATION_SOLUTION | RESPIRATORY_TRACT | Status: DC | PRN

## 2016-12-13 MED ORDER — PIPERACILLIN-TAZOBACTAM 3.375 G IVPB: 3.3750 g | Freq: Once | INTRAVENOUS | Status: DC

## 2016-12-13 MED ORDER — HYDRALAZINE HCL 20 MG/ML IJ SOLN: 5.0000 mg | INTRAMUSCULAR | Status: DC | PRN

## 2016-12-13 MED ORDER — PANTOPRAZOLE SODIUM 40 MG PO TBEC
40.0000 mg | DELAYED_RELEASE_TABLET | Freq: Every day | ORAL | Status: DC
  Administered 2016-12-13 – 2016-12-17 (×5): 40 mg via ORAL
  Filled 2016-12-13 (×5): qty 1

## 2016-12-13 MED ORDER — VANCOMYCIN HCL IN DEXTROSE 1-5 GM/200ML-% IV SOLN
1000.0000 mg | INTRAVENOUS | Status: DC
Start: 2016-12-13 — End: 2016-12-16
  Administered 2016-12-13 – 2016-12-15 (×3): 1000 mg via INTRAVENOUS
  Filled 2016-12-13 (×3): qty 200

## 2016-12-13 MED ORDER — CARVEDILOL 12.5 MG PO TABS
12.5000 mg | ORAL_TABLET | Freq: Two times a day (BID) | ORAL | Status: DC
  Administered 2016-12-13 – 2016-12-17 (×9): 12.5 mg via ORAL
  Filled 2016-12-13 (×9): qty 1

## 2016-12-13 MED ORDER — CLONIDINE HCL 0.1 MG PO TABS
0.1000 mg | ORAL_TABLET | Freq: Every day | ORAL | Status: DC
  Administered 2016-12-13 – 2016-12-17 (×5): 0.1 mg via ORAL
  Filled 2016-12-13 (×5): qty 1

## 2016-12-13 MED ORDER — PIPERACILLIN-TAZOBACTAM 4.5 G IVPB
4.5000 g | Freq: Once | INTRAVENOUS | Status: DC
Start: 2016-12-13 — End: 2016-12-13

## 2016-12-13 MED ORDER — ACETAMINOPHEN 325 MG PO TABS
650.0000 mg | ORAL_TABLET | Freq: Four times a day (QID) | ORAL | Status: DC | PRN
  Administered 2016-12-15: 650 mg via ORAL
  Filled 2016-12-13: qty 2

## 2016-12-13 MED ORDER — CHLORHEXIDINE GLUCONATE CLOTH 2 % EX PADS: 6.0000 | MEDICATED_PAD | Freq: Every day | CUTANEOUS | Status: DC

## 2016-12-13 NOTE — Progress Notes (Addendum)
Upon chart review, pt's complaints are N/V diarrhea, GERD.  Per hard chart order from [facility] on 3/6, pt is s/p cholecystectomy and exploratory laparotomy with lysis of adhesions.  MD please clarify if desire SLP swallow evaluation.    Spoke to MD regarding pt's symptoms and evaluation desired.  Will plan for MBS due to concern for possible sensory motor deficits.  MD agreeable.   Rn informed. Thanks. Luanna Salk, [facility] SLP [phone]

## 2016-12-13 NOTE — Progress Notes (Addendum)
Patient seen and examined. Admitted after midnight secondary to nausea, vomiting, diarrhea and GERD. Had hx of recent admission for cholecystectomy and lysis of adhesions; now presented with SOB and x-ray findings concerning for PNA. Patient is afebrile currently. Please refer to H&P written by Dr. Blaine Hamper for further info/details.   Plan: -will get MBS for initiation on evaluation for aspiration PNA -case discussed with SPL therapist  -continue current abx's (aiming for aspiration and HCAP coverage) -start flutter valve and continue PRN duoneb -follow clinical response  -slowly advance diet as tolerated   Barton Dubois [phone]

## 2016-12-13 NOTE — H&P (Addendum)
History and Physical    [name]:[id number] DOB: 10-25-1935 DOA: 12/12/2016  Referring MD/NP/PA:   PCP: Osborne Casco, MD   Patient coming from:  The patient is coming from rehabilitation facility.  At baseline, pt is dependent for most of ADL.   Chief Complaint: Shortness of breath, nausea, vomiting, diarrhea and abdominal pain and AMS  HPI: Melanie [name] is a 81 y.o. female with medical history significant of hypertension, hyperlipidemia, COPD, GERD, depression, uterine fibroids, small bowel obstruction, who presents with shortness breath, nausea, vomiting, diarrhea and abdominal pain.  Pt was recently admitted to hospital 10/28/16-12/09/16. She underwent several complicated abdominal surgeries, including laproscopic cholecystectomy, SBO and lysis of adhesion and reduction of volvulus. She was discharged to rehabilitation facility. Per her daughter, patient pt has been having nausea, vomiting, abdominal pain and diarrhea in the past 4 days. Patient vomits each time when she eats food. She also has diarrhea with 2 to 3 loose stool bowel movements each day. Her abdominal pain is diffuse, moderate, constant, nonradiating. It is not aggravated or alleviated by any factors. Patient does not have fever or chills. No symptoms of UTI, no hematuria, hematochezia. Per her daughter, patient also has SOB, but no cough or chest pain. Patient moves all extremities, no facial droop or slurred speech. Patient is more confused than her baseline recently per her daughter.    ED Course: pt was found to have  WBC 18.1, lactate 1.30, lipase 22, negative urinalysis, electrolytes renal function okay, hyperthermia 96.4, oxygen saturation 94% on room air. Pt is admitted to tele bed as inpt.  # CT-abdomen/pelvis showed 1. small bilateral pleural effusions, dense consolidation in the right lower lobe which may reflect atelectasis, pneumonia or aspiration. 2. There is no evidence for small bowel dilatation or  evidence of a small bowel obstruction. 3. Small free fluid in the pelvis 4. Enhancing uterine masses suggesting fibroids.  # CXR showed small bilateral effusions. Improved aeration at the bilateral lung bases  Review of Systems:  could not be reviewed accurately due to altered mental status.   Allergy:  Allergies  Allergen Reactions  . Nsaids     hypertension  . Ace Inhibitors Other (See Comments)    cough    Past Medical History:  Diagnosis Date  . Anxiousness   . COPD (chronic obstructive pulmonary disease) ([place])   . Hyperlipidemia   . Hypertension   . Small bowel obstruction due to adhesions 11/17/2016  . Uterine fibroid     Past Surgical History:  Procedure Laterality Date  . BACK SURGERY     X2  . bone removed from skull Right 1957  . CHOLECYSTECTOMY N/A 10/30/2016   Procedure: LAPAROSCOPIC CHOLECYSTECTOMY WITH INTRAOPERATIVE CHOLANGIOGRAM;  Surgeon: Johnathan Hausen, MD;  Location: [facility];  Service: General;  Laterality: N/A;  . JOINT REPLACEMENT     knee  . LAPAROTOMY N/A 11/17/2016   Procedure: EXPLORATORY LAPAROTOMY WITH LYSIS OF ADHESIONS;  Surgeon: Fanny Skates, MD;  Location: [facility];  Service: General;  Laterality: N/A;  . LAPAROTOMY N/A 11/24/2016   Procedure: RE DO EXPLORATORY LAPAROTOMY;  Surgeon: Jackolyn Confer, MD;  Location: [facility];  Service: General;  Laterality: N/A;    Social History:  reports that she quit smoking about 18 years ago. She has never used smokeless tobacco. She reports that she drinks alcohol. She reports that she does not use drugs.  Family History:  Family History  Problem Relation Age of Onset  . Heart disease Mother  before age 83     Prior to Admission medications   Medication Sig Start Date End Date Taking? Authorizing Provider  acetaminophen (TYLENOL) 325 MG tablet Take 650 mg by mouth every 6 (six) hours as needed.   Yes Historical Provider, MD  amLODipine (NORVASC) 10 MG tablet Take 1 tablet (10 mg total) by mouth daily.  12/08/16  Yes Jennifer Chahn-Yang Choi, DO  aspirin EC 81 MG tablet Take 81 mg by mouth daily.   Yes Historical Provider, MD  carvedilol (COREG) 12.5 MG tablet Take 1 tablet (12.5 mg total) by mouth 2 (two) times daily with a meal. 12/08/16  Yes Jennifer Chahn-Yang Choi, DO  chlorthalidone (HYGROTON) 25 MG tablet Take 2 tablets (50 mg total) by mouth daily. 12/08/16  Yes Jennifer Chahn-Yang Choi, DO  cloNIDine (CATAPRES) 0.1 MG tablet Take 0.1 mg by mouth daily.    Yes Historical Provider, MD  hydrALAZINE (APRESOLINE) 50 MG tablet Take 1 tablet (50 mg total) by mouth 4 (four) times daily. 12/07/16  Yes Jennifer Chahn-Yang Choi, DO  losartan (COZAAR) 50 MG tablet Take 1 tablet (50 mg total) by mouth daily. 12/08/16  Yes Jennifer Chahn-Yang Choi, DO  mirtazapine (REMERON SOL-TAB) 15 MG disintegrating tablet Take 15 mg by mouth at bedtime.   Yes Historical Provider, MD  Multiple Vitamins-Iron (MULTIVITAMINS WITH IRON) TABS tablet Take 1 tablet by mouth daily. 12/08/16  Yes Earnstine Regal, PA-C  omeprazole (PRILOSEC) 40 MG capsule Take 40 mg by mouth 2 (two) times daily.    Yes Historical Provider, MD  oxyCODONE (OXY IR/ROXICODONE) 5 MG immediate release tablet Take 5-10 mg by mouth every 4 (four) hours as needed for severe pain.   Yes Historical Provider, MD  promethazine (PHENERGAN) 25 MG suppository Place 25 mg rectally every 6 (six) hours as needed for nausea or vomiting.   Yes Historical Provider, MD  promethazine (PHENERGAN) 25 MG/ML injection Inject 25 mg into the vein once.   Yes Historical Provider, MD    Physical Exam: Vitals:   12/12/16 2030 12/12/16 2031 12/12/16 2325 12/13/16 0130  BP: 163/58  (!) 151/50 (!) 136/49  Pulse: 96  90 90  Resp: 18  17 12   Temp: 98.9 F (37.2 C)     TempSrc: Oral     SpO2: 94%  98% 99%  Weight:  82.1 kg (181 lb)    Height:  5\' 2"  (1.575 m)     General: Not in acute distress HEENT:       Eyes: PERRL, EOMI, no scleral icterus.       ENT: No discharge from the  ears and nose, no pharynx injection, no tonsillar enlargement.        Neck: No JVD, no bruit, no mass felt. Heme: No neck lymph node enlargement. Cardiac: S1/S2, RRR, No murmurs, No gallops or rubs. Respiratory: has rhonchi bilaterally. GI: Soft, nondistended, has diffused tenderness, no rebound pain, no organomegaly, BS present. Surgical site healing well. GU: No hematuria Ext: No pitting leg edema bilaterally. 2+DP/PT pulse bilaterally. Musculoskeletal: No joint deformities, No joint redness or warmth, no limitation of ROM in spin. Skin: No rashes.  Neuro:confused, recognized her daughter, not oriented to time and place. Cranial nerves II-XII grossly intact, moves all extremities normally. Psych: Patient is not psychotic, no suicidal or hemocidal ideation.  Labs on Admission: I have personally reviewed following labs and imaging studies  CBC:  Recent Labs Lab 12/07/16 1044 12/08/16 0447 12/09/16 0416 12/12/16 2141  WBC 9.4 11.0* 11.3* 18.1*  NEUTROABS 5.4  --   --  14.0*  HGB 7.7* 7.9* 7.6* 8.9*  HCT 23.8* 24.3* 23.4* 27.2*  MCV 82.4 82.9 83.9 82.4  PLT 378 374 358 505   Basic Metabolic Panel:  Recent Labs Lab 12/06/16 0500 12/07/16 0511 12/08/16 0447 12/08/16 1816 12/09/16 0416 12/12/16 2141  NA 142 141 143  --  140 140  K 3.0* 4.0 3.8 3.3* 3.5 3.9  CL 105 106 109  --  107 102  CO2 30 28 26   --  25 24  GLUCOSE 108* 98 77  --  79 95  BUN 18 17 14   --  11 16  CREATININE 0.43* 0.49 0.55  --  0.42* 1.03*  CALCIUM 8.2* 8.2* 8.5*  --  8.3* 7.8*  MG 2.0 2.0  --  1.6*  --   --   PHOS  --  3.2  --   --   --   --    GFR: Estimated Creatinine Clearance: 44.7 mL/min (by C-G formula based on SCr of 1.03 mg/dL (H)). Liver Function Tests:  Recent Labs Lab 12/07/16 0511 12/12/16 2141  AST 23 30  ALT 24 21  ALKPHOS 166* 116  BILITOT 0.7 1.5*  PROT 6.5 7.1  ALBUMIN 2.1* 2.5*    Recent Labs Lab 12/12/16 2141  LIPASE 22   No results for input(s): AMMONIA in the  last 168 hours. Coagulation Profile: No results for input(s): INR, PROTIME in the last 168 hours. Cardiac Enzymes: No results for input(s): CKTOTAL, CKMB, CKMBINDEX, TROPONINI in the last 168 hours. BNP (last 3 results) No results for input(s): PROBNP in the last 8760 hours. HbA1C: No results for input(s): HGBA1C in the last 72 hours. CBG:  Recent Labs Lab 12/06/16 1734 12/07/16 0733 12/07/16 1125 12/07/16 1657 12/08/16 0737  GLUCAP 112* 115* 118* 88 71   Lipid Profile: No results for input(s): CHOL, HDL, LDLCALC, TRIG, CHOLHDL, LDLDIRECT in the last 72 hours. Thyroid Function Tests: No results for input(s): TSH, T4TOTAL, FREET4, T3FREE, THYROIDAB in the last 72 hours. Anemia Panel: No results for input(s): VITAMINB12, FOLATE, FERRITIN, TIBC, IRON, RETICCTPCT in the last 72 hours. Urine analysis:    Component Value Date/Time   COLORURINE YELLOW 12/12/2016 2250   APPEARANCEUR CLEAR 12/12/2016 2250   LABSPEC 1.015 12/12/2016 2250   PHURINE 5.0 12/12/2016 2250   GLUCOSEU NEGATIVE 12/12/2016 2250   HGBUR NEGATIVE 12/12/2016 2250   BILIRUBINUR NEGATIVE 12/12/2016 2250   KETONESUR 80 (A) 12/12/2016 2250   PROTEINUR NEGATIVE 12/12/2016 2250   NITRITE NEGATIVE 12/12/2016 2250   LEUKOCYTESUR NEGATIVE 12/12/2016 2250   Sepsis Labs: @LABRCNTIP (procalcitonin:4,lacticidven:4) ) Recent Results (from the past 240 hour(s))  C difficile quick scan w PCR reflex     Status: None   Collection Time: 12/08/16  4:53 PM  Result Value Ref Range Status   C Diff antigen NEGATIVE NEGATIVE Final   C Diff toxin NEGATIVE NEGATIVE Final   C Diff interpretation No C. difficile detected.  Final     Radiological Exams on Admission: Dg Chest 2 View  Result Date: 12/12/2016 CLINICAL DATA:  Nausea vomiting and abdominal pain EXAM: CHEST  2 VIEW COMPARISON:  12/08/2016 FINDINGS: Right upper extremity catheter has been removed. There are tiny bilateral effusions. No focal consolidation. Stable mild  cardiomegaly. No pneumothorax. Partially visualized hardware within the spine. IMPRESSION: 1. Removal of right upper extremity catheter 2. Small bilateral effusions. Improved aeration at the bilateral lung bases 3. Mild cardiomegaly Electronically Signed   By: Maudie Mercury  Francoise Ceo M.D.   On: 12/12/2016 22:32   Ct Abdomen Pelvis W Contrast  Result Date: 12/13/2016 CLINICAL DATA:  Vomiting EXAM: CT ABDOMEN AND PELVIS WITH CONTRAST TECHNIQUE: Multidetector CT imaging of the abdomen and pelvis was performed using the standard protocol following bolus administration of intravenous contrast. CONTRAST:  143mL ISOVUE-300 IOPAMIDOL (ISOVUE-300) INJECTION 61% COMPARISON:  12/06/2016, CT 11/23/2016 FINDINGS: Lower chest: Consolidation within the right lower lobe. Small bilateral pleural effusions. Dense mitral calcifications. Borderline to mild cardiomegaly. Hepatobiliary: No biliary dilatation. Surgical clips at the gallbladder fossa. Fluid density vague lesion adjacent to or within lateral segment of left hepatic lobe. Pancreas: Unremarkable. No pancreatic ductal dilatation or surrounding inflammatory changes. Spleen: Normal in size without focal abnormality. Adrenals/Urinary Tract: Adrenal glands are within normal limits. Kidneys show no hydronephrosis. The bladder is normal. Stomach/Bowel: The stomach is nonenlarged. There is no dilated small bowel. No significant colon wall thickening. Vascular/Lymphatic: Atherosclerosis. Non aneurysmal aorta. No grossly enlarged lymph nodes Reproductive: Enhancing masses within the uterus with partial calcification, consistent with fibroids. No adnexal masses. Other: No free air. Trace free fluid in the pelvis. Diffuse subcutaneous edema. Ventral postsurgical scarring. Musculoskeletal: No acute osseous abnormality. Postsurgical changes of the lumbar spine. IMPRESSION: 1. Small bilateral pleural effusions. There is dense consolidation in the right lower lobe which may reflect atelectasis,  pneumonia or aspiration 2. There is no evidence for small bowel dilatation or evidence of a small bowel obstruction. 3. Small free fluid in the pelvis 4. Enhancing uterine masses suggesting fibroids. Electronically Signed   By: Donavan Foil M.D.   On: 12/13/2016 00:02     EKG: Independently reviewed.  Not done in ED, will get one.   Assessment/Plan Principal Problem:   Aspiration pneumonia (HCC) Active Problems:   Essential hypertension   Hyperlipidemia   COPD (chronic obstructive pulmonary disease) (HCC)   GERD (gastroesophageal reflux disease)   Nausea vomiting and diarrhea   Sepsis ([place])   Acute encephalopathy  Possible Aspiration pneumonia vs HCAP and sepsis: Patient has a shortness breath and CT scan of abdomen/pelvis showed right lower lobe consolidation, indicating possible pneumonia, aspiration versus HCAP. Pt meets criteria for sepsis with leukocytosis, hypothermia and heart rate>90. Lactic acid normal. Hemodynamically stable.  - Will admit to telemetry bed as inpt - IV Vancomycin, Zosyn and azithromycin - prn Albuterol Nebs for SOB - Urine legionella and S. pneumococcal antigen - Follow up blood culture x2, sputum culture  - will get Procalcitonin and trend lactic acid level per sepsis protocol - IVF:500 of NS bolus in ED, followed by 75 mL per hour of NS  - check SLP  Acute encephalopathy: Likely secondary to pneumonia and sepsis -Treat underlying issues -Frequent neuro check  Nausea vomiting, diarrhea and diarrhea and abdominal pain: Etiology is not clear. CT abdomen/pelvis did not show acute issues. Lipase normal. Her abdominal pain may be related to recent complicated abdominal surgery. Will need to rule out C. difficile colitis for diarrhea. -IV fluid as above -IV when necessary Zofran for nausea and vomiting, morphine for pain -Check C. difficile PCR  HTN: -continue amlodipine, Coreg, clonidine, hydralazine, Cozaar -Hold chlorthalidone due to  sepsis  GERD: -Protonix  COPD (chronic obstructive pulmonary disease) ([place]):  -When necessary albuterol nebulizers  DVT ppx: SQ Lovenox Code Status: Full code Family Communication:  Yes, patient's daughter at bed side Disposition Plan:  Anticipate discharge back to previous rehabilitation facility  Consults called:  none Admission status:  Inpatient/tele    Date of Service 12/13/2016  Ivor Costa [facility] Pager [phone]  If 7PM-7AM, please contact night-coverage [email or web address] Password [email or web address] 12/13/2016, 2:15 AM

## 2016-12-13 NOTE — Progress Notes (Signed)
Initial Nutrition Assessment  DOCUMENTATION CODES:   Severe malnutrition in context of acute illness/injury, Severe malnutrition in context of chronic illness  INTERVENTION:  - Diet advancement as medically feasible. - RD will order oral nutrition supplements per pt preference with diet advancement.   NUTRITION DIAGNOSIS:   Malnutrition related to acute illness, chronic illness as evidenced by moderate depletions of muscle mass, energy intake < or equal to 50% for > or equal to 5 days, percent weight loss.  GOAL:   Patient will meet greater than or equal to 90% of their needs  MONITOR:   Diet advancement, Weight trends, Labs, I & O's  REASON FOR ASSESSMENT:   Malnutrition Screening Tool  ASSESSMENT:   81 y.o. female with medical history significant of hypertension, hyperlipidemia, COPD, GERD, depression, uterine fibroids, small bowel obstruction, who presents with shortness breath, nausea, vomiting, diarrhea and abdominal pain.  Pt seen for MST. BMI indicates overweight, borderline obese status. Pt has been NPO since admission and unable to meet estimated nutrition needs. RD familiar with pt from previous prolonged hospitalization with TPN requirement at that time. She reports that since d/c on 3/1 she has been experiencing N/V and that vomiting occurred with most PO intakes; she denies it being related to any one food or beverage item in particular. During that time she was many consuming bites and sips. She denies nausea at this time and states that she has not had any vomiting today.   Physical assessment shows moderate muscle wasting around clavicle and shoulder areas; she has moderate edema to BLE and mild edema to LUE. Per chart review, pt has lost 14 lbs (8% body weight) in the past 2 weeks which is significant for time frame. This may be fluid related as pt had been volume overloaded on previous admission. She meets criteria for malnutrition based on muscle wasting and <50%  energy intake for >/= 5 days.  Medications reviewed; daily multivitamin with iron, 40 mg oral Protonix/day.  Labs reviewed; creatinine: 1.03 mg/dL, Ca: 7.8 mg/dL, GFR: 59 mL/min.   IVF: NS @ 75 mL/hr.    Diet Order:  Diet NPO time specified  Skin:     Last BM:  3/6  Height:   Ht Readings from Last 1 Encounters:  12/13/16 5\' 2"  (1.575 m)    Weight:   Wt Readings from Last 1 Encounters:  12/13/16 160 lb 11.5 oz (72.9 kg)    Ideal Body Weight:  50 kg  BMI:  Body mass index is 29.4 kg/m.  Estimated Nutritional Needs:   Kcal:  1600-1820 (22-25 kcal/kg)  Protein:  73-87 grams (1-1.2 grams/kg)  Fluid:  1.6-1.8 L/day  EDUCATION NEEDS:   No education needs identified at this time    Jarome Matin, MS, RD, LDN, CNSC Inpatient Clinical Dietitian Pager # [phone] After hours/weekend pager # [phone]

## 2016-12-13 NOTE — Progress Notes (Signed)
Pharmacy Antibiotic Note  Melanie [name] is a 81 y.o. female c/o vomiting s/p recent cholecystectomy admitted on 12/12/2016 with pneumonia.  Pharmacy has been consulted for zosyn and vancomycin dosing.  Plan: Vancomycin 750 mg IV every 12 hours.  Goal trough 15-20 mcg/mL. Zosyn 3.375 Gm IV q8h EI (MD) Zmax 500 mg IV q24h Daily SCr  Height: 5\' 2"  (157.5 cm) Weight: 181 lb (82.1 kg) IBW/kg (Calculated) : 50.1  Temp (24hrs), Avg:97.7 F (36.5 C), Min:96.4 F (35.8 C), Max:98.9 F (37.2 C)   Recent Labs Lab 12/06/16 0036 12/06/16 0500 12/07/16 0511 12/07/16 1044 12/08/16 0447 12/09/16 0416 12/12/16 2141 12/12/16 2155  WBC 9.6  --   --  9.4 11.0* 11.3* 18.1*  --   CREATININE  --  0.43* 0.49  --  0.55 0.42* 1.03*  --   LATICACIDVEN  --   --   --   --   --   --   --  1.30    Estimated Creatinine Clearance: 44.7 mL/min (by C-G formula based on SCr of 1.03 mg/dL (H)).    Allergies  Allergen Reactions  . Nsaids     hypertension  . Ace Inhibitors Other (See Comments)    cough    Antimicrobials this admission: 3/7 zosyn >>  3/7 vancomycin >>  3/7 zmax >>  Dose adjustments this admission:   Microbiology results:  BCx:   UCx:    Sputum:    MRSA PCR:   Thank you for allowing pharmacy to be a part of this patient's care.  Melanie [name] 12/13/2016 12:11 AM

## 2016-12-13 NOTE — Progress Notes (Signed)
Modified Barium Swallow Progress Note  Patient Details  Name: Melanie [name] MRN: [id number] Date of Birth: [birth year]  Today's Date: 12/13/2016  Modified Barium Swallow completed.  Full report located under Chart Review in the Imaging Section.  Brief recommendations include the following:  Clinical Impression  Pt presents with minimal oral deficits resulting in delayed oral transiting and piecemealing.  Suspect this is due to pt's h/o nausea/vomiting as she confirms oral deficits not present prior to N/V/D issues.    Pharyngeal swallow was timely and strong without residuals nor aspiration/penetration.      Of note, pt admits to problems with vomiting after meals and states food "balls up in mouth".  Recommend diet as Md indicates. No SLP folllow up indicated.  Thanks for referral.    Swallow Evaluation Recommendations       SLP Diet Recommendations:  (defer to MD due to recent N/V/D- rec regular/thin when MD allows)   Liquid Administration via: Cup;Straw   Medication Administration: Whole meds with liquid   Supervision: Patient able to self feed   Compensations: Slow rate;Small sips/bites   Postural Changes: Remain semi-upright after after feeds/meals (Comment)   Oral Care Recommendations: Oral care BID        Luanna Salk, [place] [facility] SLP [phone]

## 2016-12-13 NOTE — Progress Notes (Signed)
Pharmacy Antibiotic Note  Melanie [name] is a 81 y.o. female c/o vomiting s/p recent cholecystectomy admitted on 12/12/2016 with pneumonia.  Pharmacy has been consulted for zosyn and vancomycin dosing. Antibiotics adjusted to vancomycin and unasyn 3/7 for aspiration pneumonia.   Today, 12/13/2016 Day #1 antibiotics Renal: SCr double from recent values WBC elevated PCT mildly increased  Plan:  Based on doubled SCr from recent admission, adjust vancomycin empirically to 1gm IV q24h  MRSA PCR is negative - consider stopping if BCx do not reveal any growth  Check level as indicated  Unasyn 3gm IV q6h  Daily SCr with vancomycin and increased SCr from baseline  Height: 5\' 2"  (157.5 cm) Weight: 160 lb 11.5 oz (72.9 kg) IBW/kg (Calculated) : 50.1  Temp (24hrs), Avg:98.1 F (36.7 C), Min:96.4 F (35.8 C), Max:98.9 F (37.2 C)   Recent Labs Lab 12/07/16 0511 12/07/16 1044 12/08/16 0447 12/09/16 0416 12/12/16 2141 12/12/16 2155 12/13/16 0059  WBC  --  9.4 11.0* 11.3* 18.1*  --   --   CREATININE 0.49  --  0.55 0.42* 1.03*  --   --   LATICACIDVEN  --   --   --   --   --  1.30 0.8    Estimated Creatinine Clearance: 42.1 mL/min (by C-G formula based on SCr of 1.03 mg/dL (H)).    Allergies  Allergen Reactions  . Nsaids     hypertension  . Ace Inhibitors Other (See Comments)    cough    Antimicrobials this admission: 3/7 zosyn >> 3/7 3/7 vancomycin >>  3/7 zmax >> 3/7 3/7 Unasyn >>  Dose adjustments this admission:   Microbiology results: 3/7 BCx:  3/7 C. difficile:  3/7 MRSA PCR: neg Sputum (ordered)   Thank you for allowing pharmacy to be a part of this patient's care.  Doreene Eland, PharmD, BCPS.   Pager: [phone] 12/13/2016 12:38 PM

## 2016-12-14 DIAGNOSIS — G9340 Encephalopathy, unspecified: Secondary | ICD-10-CM

## 2016-12-14 DIAGNOSIS — I10 Essential (primary) hypertension: Secondary | ICD-10-CM

## 2016-12-14 DIAGNOSIS — J449 Chronic obstructive pulmonary disease, unspecified: Secondary | ICD-10-CM

## 2016-12-14 DIAGNOSIS — K219 Gastro-esophageal reflux disease without esophagitis: Secondary | ICD-10-CM

## 2016-12-14 LAB — CREATININE, SERUM: CREATININE: 0.69 mg/dL (ref 0.44–1.00)

## 2016-12-14 MED ORDER — MORPHINE SULFATE (PF) 4 MG/ML IV SOLN
2.0000 mg | Freq: Once | INTRAVENOUS | Status: AC
  Administered 2016-12-14: 2 mg via INTRAVENOUS
  Filled 2016-12-14: qty 1

## 2016-12-14 MED ORDER — BISACODYL 5 MG PO TBEC: 10.0000 mg | DELAYED_RELEASE_TABLET | Freq: Every day | ORAL | Status: DC | PRN

## 2016-12-14 MED ORDER — HYOSCYAMINE SULFATE 0.125 MG SL SUBL
0.2500 mg | SUBLINGUAL_TABLET | Freq: Once | SUBLINGUAL | Status: AC
  Administered 2016-12-14: 0.25 mg via SUBLINGUAL
  Filled 2016-12-14: qty 2

## 2016-12-14 MED ORDER — HYOSCYAMINE SULFATE 0.5 MG/ML IJ SOLN
0.5000 mg | Freq: Four times a day (QID) | INTRAMUSCULAR | Status: DC | PRN
  Filled 2016-12-14: qty 1

## 2016-12-14 MED ORDER — DOCUSATE SODIUM 100 MG PO CAPS
100.0000 mg | ORAL_CAPSULE | Freq: Two times a day (BID) | ORAL | Status: DC
  Administered 2016-12-14 – 2016-12-16 (×3): 100 mg via ORAL
  Filled 2016-12-14 (×5): qty 1

## 2016-12-14 NOTE — Progress Notes (Signed)
[facility] PROGRESS NOTE  Melanie [name] YNW:[id number] DOB: 09-23-1936 DOA: 12/12/2016 PCP: Osborne Casco, MD  Interim summary and HPI 81 y.o. female with medical history significant of hypertension, hyperlipidemia, COPD, GERD, depression, uterine fibroids, small bowel obstruction, who presents with shortness breath, nausea, vomiting, diarrhea and abdominal pain. Pt was recently admitted to hospital 10/28/16-12/09/16. She underwent several complicated abdominal surgeries, including laproscopic cholecystectomy, SBO and lysis of adhesion and reduction of volvulus. She was discharged to rehabilitation facility. Per her daughter, patient pt has been having nausea, vomiting, abdominal pain and diarrhea in the past 4 days. Patient vomits each time when she eats food.        Patient found to have HCAP Vs aspiration PNA; admitted for further evaluation and treatment.   Assessment/Plan: Aspiration pneumonia vs HCAP and sepsis: Patient has a shortness breath and CT scan of abdomen/pelvis showed right lower lobe consolidation, indicating possible pneumonia, aspiration versus HCAP.  -Pt met criteria for sepsis with leukocytosis, hypothermia and heart rate>90 and acute encephalopathy. CXR demonstrating PNA. -continue IV antibiotics -SLP w/o significant risk for aspiration (which means that if patient aspirated; was during vomiting events) -continue neb treatment as needed  -continue supportive care -follow blood cx and sputum culture  Acute encephalopathy: Likely secondary to pneumonia and sepsis -Treat underlying issues -Frequent neuro checks -follow clinical response and continue supportive care -patient still somnolent; but per daughter reports, with better insight   Nausea vomiting, diarrhea and abdominal pain: Etiology is not clear. CT abdomen/pelvis did not show acute issues.  -Lipase normal.  -Her abdominal pain might be related to recent complicated abdominal surgery; will ask CCS  opinion and recommendations. -Will need to rule out C. difficile colitis for diarrhea. -continue IVF's resuscitation, encourage PO intake, continue supportive care and follow clinical response   HTN: -continue amlodipine, Coreg, clonidine, hydralazine, Cozaar -Holding diuretics given acute setting of dehydration/sepsis. -BP stable  GERD: -continue Protonix  COPD (chronic obstructive pulmonary disease) ([place]):  -continue PRN albuterol nebulizer treatment -no wheezing currently  Code Status: Full Family Communication: daughter at bedside  Disposition Plan: back to SNF once medically stable. Needs to be able to tolerate diet/medications. Weaned O2 supplementation if possible.   Consultants:  CCS  Procedures:  See below for x-ray reports   Antibiotics:  Vancomycin and Unasyn 12/13/16  HPI/Subjective: Afebrile, no CP, breathing slightly better. Patient w/o any further diarrhea and no further vomiting. Reports nausea intermittently and diffuse abd pain  Objective: Vitals:   12/14/16 0406 12/14/16 1644  BP: 138/60 (!) 147/51  Pulse: 73 92  Resp: 16 16  Temp: 97.7 F (36.5 C) 98.7 F (37.1 C)    Intake/Output Summary (Last 24 hours) at 12/14/16 1854 Last data filed at 12/14/16 1700  Gross per 24 hour  Intake             2690 ml  Output              600 ml  Net             2090 ml   Filed Weights   12/12/16 2031 12/13/16 0230  Weight: 82.1 kg (181 lb) 72.9 kg (160 lb 11.5 oz)    Exam:   General:  Sleepy; reporting diffuse abd pain and nausea. No CP. Breathing improved. Afebrile.  Cardiovascular: S1 and s2, no rubs, no gallops; no JVD  Respiratory: scattered rhonchi; decrease BS at bases; no wheezing.  Abdomen: soft, slight guarding, diffuse tenderness to palpation; positive BS  Musculoskeletal:  no edema, no cyanosis   Skin: stage 2 sacral ulcer; no drainage, no surrounded erythema   Data Reviewed: Basic Metabolic Panel:  Recent Labs Lab  12/08/16 0447 12/08/16 1816 12/09/16 0416 12/12/16 2141 12/14/16 0554  NA 143  --  140 140  --   K 3.8 3.3* 3.5 3.9  --   CL 109  --  107 102  --   CO2 26  --  25 24  --   GLUCOSE 77  --  79 95  --   BUN 14  --  11 16  --   CREATININE 0.55  --  0.42* 1.03* 0.69  CALCIUM 8.5*  --  8.3* 7.8*  --   MG  --  1.6*  --   --   --    Liver Function Tests:  Recent Labs Lab 12/12/16 2141  AST 30  ALT 21  ALKPHOS 116  BILITOT 1.5*  PROT 7.1  ALBUMIN 2.5*    Recent Labs Lab 12/12/16 2141  LIPASE 22   CBC:  Recent Labs Lab 12/08/16 0447 12/09/16 0416 12/12/16 2141  WBC 11.0* 11.3* 18.1*  NEUTROABS  --   --  14.0*  HGB 7.9* 7.6* 8.9*  HCT 24.3* 23.4* 27.2*  MCV 82.9 83.9 82.4  PLT 374 358 381   BNP (last 3 results)  Recent Labs  11/20/16 0833  BNP 201.1*   CBG:  Recent Labs Lab 12/08/16 0737  GLUCAP 71    Recent Results (from the past 240 hour(s))  C difficile quick scan w PCR reflex     Status: None   Collection Time: 12/08/16  4:53 PM  Result Value Ref Range Status   C Diff antigen NEGATIVE NEGATIVE Final   C Diff toxin NEGATIVE NEGATIVE Final   C Diff interpretation No C. difficile detected.  Final  Blood Culture (routine x 2)     Status: None (Preliminary result)   Collection Time: 12/13/16 12:49 AM  Result Value Ref Range Status   Specimen Description BLOOD LEFT ARM  Final   Special Requests BOTTLES DRAWN AEROBIC AND ANAEROBIC 10CC  Final   Culture   Final    NO GROWTH 1 DAY Performed at [facility], [place] [address] [place], [place] [address]    Report Status PENDING  Incomplete  Blood Culture (routine x 2)     Status: None (Preliminary result)   Collection Time: 12/13/16 12:51 AM  Result Value Ref Range Status   Specimen Description BLOOD RIGHT ANTECUBITAL  Final   Special Requests BOTTLES DRAWN AEROBIC AND ANAEROBIC 10CC  Final   Culture   Final    NO GROWTH 1 DAY Performed at [facility], [place] [address], [place],  [place] [address]    Report Status PENDING  Incomplete  MRSA PCR Screening     Status: None   Collection Time: 12/13/16  4:25 AM  Result Value Ref Range Status   MRSA by PCR NEGATIVE NEGATIVE Final    Comment:        The GeneXpert MRSA Assay (FDA approved for NASAL specimens only), is one component of a comprehensive MRSA colonization surveillance program. It is not intended to diagnose MRSA infection nor to guide or monitor treatment for MRSA infections.      Studies: Dg Chest 2 View  Result Date: 12/12/2016 CLINICAL DATA:  Nausea vomiting and abdominal pain EXAM: CHEST  2 VIEW COMPARISON:  12/08/2016 FINDINGS: Right upper extremity catheter has been removed. There  are tiny bilateral effusions. No focal consolidation. Stable mild cardiomegaly. No pneumothorax. Partially visualized hardware within the spine. IMPRESSION: 1. Removal of right upper extremity catheter 2. Small bilateral effusions. Improved aeration at the bilateral lung bases 3. Mild cardiomegaly Electronically Signed   By: Donavan Foil M.D.   On: 12/12/2016 22:32   Ct Abdomen Pelvis W Contrast  Result Date: 12/13/2016 CLINICAL DATA:  Vomiting EXAM: CT ABDOMEN AND PELVIS WITH CONTRAST TECHNIQUE: Multidetector CT imaging of the abdomen and pelvis was performed using the standard protocol following bolus administration of intravenous contrast. CONTRAST:  166m ISOVUE-300 IOPAMIDOL (ISOVUE-300) INJECTION 61% COMPARISON:  12/06/2016, CT 11/23/2016 FINDINGS: Lower chest: Consolidation within the right lower lobe. Small bilateral pleural effusions. Dense mitral calcifications. Borderline to mild cardiomegaly. Hepatobiliary: No biliary dilatation. Surgical clips at the gallbladder fossa. Fluid density vague lesion adjacent to or within lateral segment of left hepatic lobe. Pancreas: Unremarkable. No pancreatic ductal dilatation or surrounding inflammatory changes. Spleen: Normal in size without focal abnormality. Adrenals/Urinary Tract:  Adrenal glands are within normal limits. Kidneys show no hydronephrosis. The bladder is normal. Stomach/Bowel: The stomach is nonenlarged. There is no dilated small bowel. No significant colon wall thickening. Vascular/Lymphatic: Atherosclerosis. Non aneurysmal aorta. No grossly enlarged lymph nodes Reproductive: Enhancing masses within the uterus with partial calcification, consistent with fibroids. No adnexal masses. Other: No free air. Trace free fluid in the pelvis. Diffuse subcutaneous edema. Ventral postsurgical scarring. Musculoskeletal: No acute osseous abnormality. Postsurgical changes of the lumbar spine. IMPRESSION: 1. Small bilateral pleural effusions. There is dense consolidation in the right lower lobe which may reflect atelectasis, pneumonia or aspiration 2. There is no evidence for small bowel dilatation or evidence of a small bowel obstruction. 3. Small free fluid in the pelvis 4. Enhancing uterine masses suggesting fibroids. Electronically Signed   By: KDonavan FoilM.D.   On: 12/13/2016 00:02   Dg Swallowing Func-speech Pathology  Result Date: 12/13/2016 Objective Swallowing Evaluation: Type of Study: MBS-Modified Barium Swallow Study Patient Details Name: CDEZIYAH ARVINMRN: [id number] of Birth: [birth year]Today's Date: 12/13/2016 Time: SLP Start Time (ACUTE ONLY): 1330-SLP Stop Time (ACUTE ONLY): 1340 SLP Time Calculation (min) (ACUTE ONLY): 10 min Past Medical History: Past Medical History: Diagnosis Date . Anxiousness  . COPD (chronic obstructive pulmonary disease) ([place]  . Hyperlipidemia  . Hypertension  . Small bowel obstruction due to adhesions 11/17/2016 . Uterine fibroid  Past Surgical History: Past Surgical History: Procedure Laterality Date . BACK SURGERY    X2 . bone removed from skull Right 1957 . CHOLECYSTECTOMY N/A 10/30/2016  Procedure: LAPAROSCOPIC CHOLECYSTECTOMY WITH INTRAOPERATIVE CHOLANGIOGRAM;  Surgeon: MJohnathan Hausen MD;  Location: [facility];  Service: General;  Laterality:  N/A; . JOINT REPLACEMENT    knee . LAPAROTOMY N/A 11/17/2016  Procedure: EXPLORATORY LAPAROTOMY WITH LYSIS OF ADHESIONS;  Surgeon: HFanny Skates MD;  Location: [facility];  Service: General;  Laterality: N/A; . LAPAROTOMY N/A 11/24/2016  Procedure: RE DO EXPLORATORY LAPAROTOMY;  Surgeon: TJackolyn Confer MD;  Location: [facility];  Service: General;  Laterality: N/A; HPI: 81y.o. female with medical history significant of hypertension, hyperlipidemia, COPD, GERD, depression, uterine fibroids, small bowel obstruction, who presents with shortness breath, nausea, vomiting, diarrhea and abdominal pain.   Subjective: pt awake in chair Assessment / Plan / Recommendation CHL IP CLINICAL IMPRESSIONS 12/13/2016 Clinical Impression Pt presents with minimal oral deficits resulting in delayed oral transiting and piecemealing.  Suspect this is due to pt's h/o nausea/vomiting as she confirms oral deficits not present  prior to N/V/D issues.  Pharyngeal swallow was timely and strong without residuals nor aspiration/penetration.    Of note, pt admits to problems with vomiting after meals and states food "balls up in mouth".  Recommend diet as Md indicates. No SLP folllow up indicated.  Thanks for referral.  SLP Visit Diagnosis Dysphagia, oral phase (R13.11) Attention and concentration deficit following -- Frontal lobe and executive function deficit following -- Impact on safety and function Mild aspiration risk   No flowsheet data found.  No flowsheet data found. CHL IP DIET RECOMMENDATION 12/13/2016 SLP Diet Recommendations (No Data) Liquid Administration via Cup;Straw Medication Administration Whole meds with liquid Compensations Slow rate;Small sips/bites Postural Changes Remain semi-upright after after feeds/meals (Comment)   CHL IP OTHER RECOMMENDATIONS 12/13/2016 Recommended Consults -- Oral Care Recommendations Oral care BID Other Recommendations --   CHL IP FOLLOW UP RECOMMENDATIONS 12/13/2016 Follow up Recommendations None        CHL IP  ORAL PHASE 12/13/2016 Oral Phase -- Oral - Pudding Teaspoon -- Oral - Pudding Cup -- Oral - Honey Teaspoon -- Oral - Honey Cup -- Oral - Nectar Teaspoon -- Oral - Nectar Cup Delayed oral transit;Premature spillage Oral - Nectar Straw Delayed oral transit;Premature spillage Oral - Thin Teaspoon -- Oral - Thin Cup Delayed oral transit;Premature spillage Oral - Thin Straw Premature spillage;Delayed oral transit Oral - Puree Delayed oral transit;Premature spillage;Lingual pumping Oral - Mech Soft Delayed oral transit;Premature spillage;Lingual pumping Oral - Regular -- Oral - Multi-Consistency -- Oral - Pill -- Oral Phase - Comment --  CHL IP PHARYNGEAL PHASE 12/13/2016 Pharyngeal Phase WFL Pharyngeal- Pudding Teaspoon -- Pharyngeal -- Pharyngeal- Pudding Cup -- Pharyngeal -- Pharyngeal- Honey Teaspoon -- Pharyngeal -- Pharyngeal- Honey Cup -- Pharyngeal -- Pharyngeal- Nectar Teaspoon -- Pharyngeal -- Pharyngeal- Nectar Cup WFL Pharyngeal -- Pharyngeal- Nectar Straw -- Pharyngeal -- Pharyngeal- Thin Teaspoon -- Pharyngeal -- Pharyngeal- Thin Cup WFL Pharyngeal -- Pharyngeal- Thin Straw WFL Pharyngeal -- Pharyngeal- Puree WFL Pharyngeal -- Pharyngeal- Mechanical Soft WFL Pharyngeal -- Pharyngeal- Regular -- Pharyngeal -- Pharyngeal- Multi-consistency -- Pharyngeal -- Pharyngeal- Pill -- Pharyngeal -- Pharyngeal Comment --  CHL IP CERVICAL ESOPHAGEAL PHASE 12/13/2016 Cervical Esophageal Phase WFL Pudding Teaspoon -- Pudding Cup -- Honey Teaspoon -- Honey Cup -- Nectar Teaspoon -- Nectar Cup -- Nectar Straw -- Thin Teaspoon -- Thin Cup -- Thin Straw -- Puree -- Mechanical Soft -- Regular -- Multi-consistency -- Pill -- Cervical Esophageal Comment -- No flowsheet data found. Luanna Salk, MS [facility] SLP [phone]               Scheduled Meds: . amLODipine  10 mg Oral Daily  . ampicillin-sulbactam (UNASYN) IV  3 g Intravenous Q6H  . aspirin EC  81 mg Oral Daily  . carvedilol  12.5 mg Oral BID WC  . cloNIDine  0.1 mg Oral  Daily  . enoxaparin (LOVENOX) injection  40 mg Subcutaneous Q24H  . hydrALAZINE  50 mg Oral QID  . losartan  50 mg Oral Daily  . mirtazapine  15 mg Oral QHS  . multivitamins with iron  1 tablet Oral Daily  . pantoprazole  40 mg Oral Daily  . vancomycin  1,000 mg Intravenous Q24H   Continuous Infusions: . sodium chloride 75 mL/hr at 12/14/16 1300    Principal Problem:   Aspiration pneumonia ([place]) Active Problems:   Essential hypertension   Hyperlipidemia   COPD (chronic obstructive pulmonary disease) (H[facility])   GERD (gastroesophageal reflux disease)   Nausea vomiting and  diarrhea   Sepsis ([place])   Acute encephalopathy   Protein-calorie malnutrition, severe    Time spent: 25 minutes    Barton Dubois  [facility] Pager [phone]. If 7PM-7AM, please contact night-coverage at [email or web address], password [facility] 12/14/2016, 6:54 PM  LOS: 1 day

## 2016-12-14 NOTE — NC FL2 (Signed)
[place] LEVEL OF CARE SCREENING TOOL     IDENTIFICATION  Patient Name: Melanie [name] Birthdate: 06-Nov-1935 Sex: female Admission Date (Current Location): 12/12/2016  [facility] and [place] Number:  Herbalist and Address:  [facility],  [place] [address], [place]      Provider Number: [id number]  Attending Physician Name and Address:  Barton Dubois, MD  Relative Name and Phone Number:       Current Level of Care: Hospital Recommended Level of Care: [place] Prior Approval Number:    Date Approved/Denied:   PASRR Number: [id number]  Discharge Plan: SNF    Current Diagnoses: Patient Active Problem List   Diagnosis Date Noted  . Aspiration pneumonia ([place]) 12/13/2016  . Nausea vomiting and diarrhea 12/13/2016  . Sepsis ([place]) 12/13/2016  . Acute encephalopathy 12/13/2016  . Protein-calorie malnutrition, severe 12/13/2016  . Diarrhea   . Healthcare-associated pneumonia   . Pressure injury of skin 11/28/2016  . Anxiousness   . H/O exploratory laparotomy 11/24/2016  . Chest pain   . Small bowel obstruction s/p ex lap & lysis of adhesions 11/17/2016 11/17/2016  . Necrotic cholecystitis s/p lap cholecystectomy 10/30/2016 11/09/2016  . GERD (gastroesophageal reflux disease) 11/09/2016  . Essential hypertension   . Hyperlipidemia   . COPD (chronic obstructive pulmonary disease) ([place])   . Generalized abdominal pain 10/28/2016    Orientation RESPIRATION BLADDER Height & Weight     Self, Time, Situation, Place  O2 (2L) Continent Weight: 160 lb 11.5 oz (72.9 kg) Height:  5\' 2"  (157.5 cm)  BEHAVIORAL SYMPTOMS/MOOD NEUROLOGICAL BOWEL NUTRITION STATUS      Continent Diet (Clear Liquid)  AMBULATORY STATUS COMMUNICATION OF NEEDS Skin   Extensive Assist Verbally  (Pressure Injury 12/13/16 Stage II -  Partial thickness loss of dermis presenting as a shallow open ulcer with a red, pink wound bed without slough. 4cm x 2cm  stage II to right buttock (right buttock), )                       Personal Care Assistance Level of Assistance  Bathing, Dressing Bathing Assistance: Limited assistance   Dressing Assistance: Limited assistance     Functional Limitations Info  Sight, Hearing, Speech Sight Info: Adequate Hearing Info: Adequate Speech Info: Adequate    SPECIAL CARE FACTORS FREQUENCY  PT (By licensed PT), OT (By licensed OT)     PT Frequency: 5x OT Frequency: 5x            Contractures      Additional Factors Info  Code Status, Allergies Code Status Info: Fullcode Allergies Info: Nsaids, Ace Inhibitors           Current Medications (12/14/2016):  This is the current hospital active medication list Current Facility-Administered Medications  Medication Dose Route Frequency Provider Last Rate Last Dose  . 0.9 %  sodium chloride infusion   Intravenous Continuous Ivor Costa, MD 75 mL/hr at 12/14/16 1300    . acetaminophen (TYLENOL) tablet 650 mg  650 mg Oral Q6H PRN Ivor Costa, MD      . albuterol (PROVENTIL) (2.5 MG/3ML) 0.083% nebulizer solution 2.5 mg  2.5 mg Nebulization Q4H PRN Ivor Costa, MD      . amLODipine (NORVASC) tablet 10 mg  10 mg Oral Daily Ivor Costa, MD   10 mg at 12/14/16 0907  . Ampicillin-Sulbactam (UNASYN) 3 g in sodium chloride 0.9 % 100 mL IVPB  3 g  Intravenous Q6H Berton Mount, RPH   3 g at 12/14/16 1300  . aspirin EC tablet 81 mg  81 mg Oral Daily Ivor Costa, MD   81 mg at 12/14/16 0907  . carvedilol (COREG) tablet 12.5 mg  12.5 mg Oral BID WC Ivor Costa, MD   12.5 mg at 12/14/16 0907  . cloNIDine (CATAPRES) tablet 0.1 mg  0.1 mg Oral Daily Ivor Costa, MD   0.1 mg at 12/14/16 0907  . enoxaparin (LOVENOX) injection 40 mg  40 mg Subcutaneous Q24H Ivor Costa, MD   40 mg at 12/14/16 0907  . hydrALAZINE (APRESOLINE) injection 5 mg  5 mg Intravenous Q2H PRN Ivor Costa, MD      . hydrALAZINE (APRESOLINE) tablet 50 mg  50 mg Oral QID Ivor Costa, MD   50 mg at 12/14/16 1300   . losartan (COZAAR) tablet 50 mg  50 mg Oral Daily Ivor Costa, MD   50 mg at 12/14/16 0907  . mirtazapine (REMERON SOL-TAB) disintegrating tablet 15 mg  15 mg Oral QHS Ivor Costa, MD   15 mg at 12/13/16 2258  . morphine 4 MG/ML injection 1 mg  1 mg Intravenous Q4H PRN Ivor Costa, MD   1 mg at 12/14/16 1259  . multivitamins with iron tablet 1 tablet  1 tablet Oral Daily Ivor Costa, MD   1 tablet at 12/13/16 0950  . ondansetron (ZOFRAN) injection 4 mg  4 mg Intravenous Q8H PRN Ivor Costa, MD   4 mg at 12/14/16 1336  . oxyCODONE (Oxy IR/ROXICODONE) immediate release tablet 5-10 mg  5-10 mg Oral Q4H PRN Ivor Costa, MD   10 mg at 12/14/16 1259  . pantoprazole (PROTONIX) EC tablet 40 mg  40 mg Oral Daily Ivor Costa, MD   40 mg at 12/14/16 0907  . vancomycin (VANCOCIN) IVPB 1000 mg/200 mL premix  1,000 mg Intravenous Q24H Berton Mount, RPH   1,000 mg at 12/13/16 2258     Discharge Medications: Please see discharge summary for a list of discharge medications.  Relevant Imaging Results:  Relevant Lab Results:   Additional Information SSN [id number]  Standley Brooking, LCSW

## 2016-12-14 NOTE — Progress Notes (Signed)
Plan for d/c to SNF, discharge planning per CSW. [phone] 

## 2016-12-14 NOTE — Progress Notes (Signed)
CSW received consult that patient was admitted from [facility]. Patient was just discharged to [facility] on Saturday, 3/3 and readmitted back to [place] 3/7. CSW met with patient & daughter, Hilda Blades at bedside to confirm plans to return to [facility] at discharge. CSW completed FL2 and will continue to follow for discharge when ready.    Raynaldo Opitz, [facility] Clinical Social Worker cell #: [phone]

## 2016-12-14 NOTE — Progress Notes (Signed)
The PCP on call was notified that the patient still was writhing in pain even after Morphine 1 mg and Zofran 4 mg were administered. Pain described by patient is "a pain all over the stomach"

## 2016-12-14 NOTE — Consult Note (Signed)
[facility] Admission Note  Melanie [name] September 23, 1936  [id number].    Requesting MD: Dyann Kief Chief Complaint/Reason for Consult: Abdominal pain  HPI:  Melanie [name] is a 81yo female PMH significant for HTN, HLD, COPD, GERD, chronic pain, depression, readmitted to [facility] 12/12/16 with persistent abdominal pain, nausea, vomiting, diarrhea, and anorexia, as well as an aspiration pneumonia. Patient was recently admitted from 10/28/16 through 12/09/16 following cholecystectomy for a necrotic gallbladder. During that admission Ms. Luddy required 2 subsequent operations for a SBO and then for a mesenteric volvuls: 1. S/p Laparoscopic Cholecystectomy with IOC for necrotic gallbladder, 10/30/16 Dr. Hassell Done 2. S/p Exploratory laparotomy, lysis of adhesions for SBO, 11/17/16 Dr. Dalbert Batman 3. S/p Emergency exploratory laparotomy, lysis of adhesions, reduction of volvulus, 11/24/16 Dr. Zella Richer. Findings: Recurrent small bowel obstruction. 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 Admits that she never really got her appetite back. States that just 4 days she started having increased, diffuse, crampy abdominal pain. The pain has gradually become more constant. She has had little to no PO intake. She has had persistent nausea, vomiting, and diarrhea. Denies hematochezia, melena, or hematemesis. Thinks that she may have had a fever, but she's not sure.   ROS: Review of Systems  Constitutional: Positive for chills, fever and malaise/fatigue.  HENT: Negative.   Eyes: Negative.   Respiratory: Positive for shortness of breath.   Cardiovascular: Negative.   Gastrointestinal: Positive for abdominal pain, diarrhea, nausea and vomiting. Negative for blood in stool, constipation, heartburn and melena.  Genitourinary: Negative.   Musculoskeletal: Positive for joint pain.  Skin: Negative.   Neurological: Positive for weakness.  All systems reviewed  and otherwise negative except for as above  Family History  Problem Relation Age of Onset  . Heart disease Mother     before age 72    Past Medical History:  Diagnosis Date  . Anxiousness   . COPD (chronic obstructive pulmonary disease) ([place])   . Hyperlipidemia   . Hypertension   . Small bowel obstruction due to adhesions 11/17/2016  . Uterine fibroid     Past Surgical History:  Procedure Laterality Date  . BACK SURGERY     X2  . bone removed from skull Right 1957  . CHOLECYSTECTOMY N/A 10/30/2016   Procedure: LAPAROSCOPIC CHOLECYSTECTOMY WITH INTRAOPERATIVE CHOLANGIOGRAM;  Surgeon: Johnathan Hausen, MD;  Location: [facility];  Service: General;  Laterality: N/A;  . JOINT REPLACEMENT     knee  . LAPAROTOMY N/A 11/17/2016   Procedure: EXPLORATORY LAPAROTOMY WITH LYSIS OF ADHESIONS;  Surgeon: Fanny Skates, MD;  Location: [facility];  Service: General;  Laterality: N/A;  . LAPAROTOMY N/A 11/24/2016   Procedure: RE DO EXPLORATORY LAPAROTOMY;  Surgeon: Jackolyn Confer, MD;  Location: [facility];  Service: General;  Laterality: N/A;    Social History:  reports that she quit smoking about 18 years ago. She has never used smokeless tobacco. She reports that she drinks alcohol. She reports that she does not use drugs.  Allergies:  Allergies  Allergen Reactions  . Nsaids     hypertension  . Ace Inhibitors Other (See Comments)    cough    Medications Prior to Admission  Medication Sig Dispense Refill  . acetaminophen (TYLENOL) 325 MG tablet Take 650 mg by mouth every 6 (six) hours as needed.    Marland Kitchen amLODipine (NORVASC) 10 MG tablet Take 1 tablet (10 mg total) by mouth daily. 30 tablet 0  .  aspirin EC 81 MG tablet Take 81 mg by mouth daily.    . carvedilol (COREG) 12.5 MG tablet Take 1 tablet (12.5 mg total) by mouth 2 (two) times daily with a meal. 60 tablet 0  . chlorthalidone (HYGROTON) 25 MG tablet Take 2 tablets (50 mg total) by mouth daily. 60 tablet 0  . cloNIDine (CATAPRES) 0.1 MG tablet  Take 0.1 mg by mouth daily.     . hydrALAZINE (APRESOLINE) 50 MG tablet Take 1 tablet (50 mg total) by mouth 4 (four) times daily. 120 tablet 0  . losartan (COZAAR) 50 MG tablet Take 1 tablet (50 mg total) by mouth daily. 30 tablet 0  . mirtazapine (REMERON SOL-TAB) 15 MG disintegrating tablet Take 15 mg by mouth at bedtime.    . Multiple Vitamins-Iron (MULTIVITAMINS WITH IRON) TABS tablet Take 1 tablet by mouth daily.  0  . omeprazole (PRILOSEC) 40 MG capsule Take 40 mg by mouth 2 (two) times daily.     Marland Kitchen oxyCODONE (OXY IR/ROXICODONE) 5 MG immediate release tablet Take 5-10 mg by mouth every 4 (four) hours as needed for severe pain.    . promethazine (PHENERGAN) 25 MG suppository Place 25 mg rectally every 6 (six) hours as needed for nausea or vomiting.    . promethazine (PHENERGAN) 25 MG/ML injection Inject 25 mg into the vein once.      Prior to Admission medications   Medication Sig Start Date End Date Taking? Authorizing Provider  acetaminophen (TYLENOL) 325 MG tablet Take 650 mg by mouth every 6 (six) hours as needed.   Yes Historical Provider, MD  amLODipine (NORVASC) 10 MG tablet Take 1 tablet (10 mg total) by mouth daily. 12/08/16  Yes Jennifer Chahn-Yang Choi, DO  aspirin EC 81 MG tablet Take 81 mg by mouth daily.   Yes Historical Provider, MD  carvedilol (COREG) 12.5 MG tablet Take 1 tablet (12.5 mg total) by mouth 2 (two) times daily with a meal. 12/08/16  Yes Jennifer Chahn-Yang Choi, DO  chlorthalidone (HYGROTON) 25 MG tablet Take 2 tablets (50 mg total) by mouth daily. 12/08/16  Yes Jennifer Chahn-Yang Choi, DO  cloNIDine (CATAPRES) 0.1 MG tablet Take 0.1 mg by mouth daily.    Yes Historical Provider, MD  hydrALAZINE (APRESOLINE) 50 MG tablet Take 1 tablet (50 mg total) by mouth 4 (four) times daily. 12/07/16  Yes Jennifer Chahn-Yang Choi, DO  losartan (COZAAR) 50 MG tablet Take 1 tablet (50 mg total) by mouth daily. 12/08/16  Yes Jennifer Chahn-Yang Choi, DO  mirtazapine (REMERON SOL-TAB)  15 MG disintegrating tablet Take 15 mg by mouth at bedtime.   Yes Historical Provider, MD  Multiple Vitamins-Iron (MULTIVITAMINS WITH IRON) TABS tablet Take 1 tablet by mouth daily. 12/08/16  Yes Earnstine Regal, PA-C  omeprazole (PRILOSEC) 40 MG capsule Take 40 mg by mouth 2 (two) times daily.    Yes Historical Provider, MD  oxyCODONE (OXY IR/ROXICODONE) 5 MG immediate release tablet Take 5-10 mg by mouth every 4 (four) hours as needed for severe pain.   Yes Historical Provider, MD  promethazine (PHENERGAN) 25 MG suppository Place 25 mg rectally every 6 (six) hours as needed for nausea or vomiting.   Yes Historical Provider, MD  promethazine (PHENERGAN) 25 MG/ML injection Inject 25 mg into the vein once.   Yes Historical Provider, MD    Blood pressure 138/60, pulse 73, temperature 97.7 F (36.5 C), temperature source Oral, resp. rate 16, height 5' 2"  (1.575 m), weight 160 lb 11.5 oz (72.9 kg),  SpO2 96 %. Physical Exam: General: pleasant, WD/WN AA female who is laying in bed in NAD HEENT: head is normocephalic, atraumatic.  Sclera are noninjected.  Mouth is pink and moist Heart: regular, rate, and rhythm.  No obvious murmurs, gallops, or rubs noted.  Palpable pedal pulses bilaterally Lungs: CTAB, no wheezes, rhonchi, or rales noted. Decreased breath sounds bilateral bases. Respiratory effort nonlabored. On Shiloh Abd: multiple lap and midline incision cdi with no erythema or drainage, soft, ND, +BS, no masses, hernias, or organomegaly. Diffuse tenderness with voluntary guarding. No rebound. MS: all 4 extremities are symmetrical with no cyanosis, clubbing, or edema. Skin: warm and dry with no masses, lesions, or rashes Psych: A&Ox3 with an appropriate affect. Neuro: CM 2-12 intact, extremity CSM intact bilaterally, normal speech  Results for orders placed or performed during the hospital encounter of 12/12/16 (from the past 48 hour(s))  Comprehensive metabolic panel     Status: Abnormal   Collection  Time: 12/12/16  9:41 PM  Result Value Ref Range   Sodium 140 135 - 145 mmol/L   Potassium 3.9 3.5 - 5.1 mmol/L   Chloride 102 101 - 111 mmol/L   CO2 24 22 - 32 mmol/L   Glucose, Bld 95 65 - 99 mg/dL   BUN 16 6 - 20 mg/dL   Creatinine, Ser 1.03 (H) 0.44 - 1.00 mg/dL   Calcium 7.8 (L) 8.9 - 10.3 mg/dL   Total Protein 7.1 6.5 - 8.1 g/dL   Albumin 2.5 (L) 3.5 - 5.0 g/dL   AST 30 15 - 41 U/L   ALT 21 14 - 54 U/L   Alkaline Phosphatase 116 38 - 126 U/L   Total Bilirubin 1.5 (H) 0.3 - 1.2 mg/dL   GFR calc non Af Amer 51 (L) >60 mL/min   GFR calc Af Amer 59 (L) >60 mL/min    Comment: (NOTE) The eGFR has been calculated using the CKD EPI equation. This calculation has not been validated in all clinical situations. eGFR's persistently <60 mL/min signify possible Chronic Kidney Disease.    Anion gap 14 5 - 15  Lipase, blood     Status: None   Collection Time: 12/12/16  9:41 PM  Result Value Ref Range   Lipase 22 11 - 51 U/L  CBC with Differential     Status: Abnormal   Collection Time: 12/12/16  9:41 PM  Result Value Ref Range   WBC 18.1 (H) 4.0 - 10.5 K/uL   RBC 3.30 (L) 3.87 - 5.11 MIL/uL   Hemoglobin 8.9 (L) 12.0 - 15.0 g/dL   HCT 27.2 (L) 36.0 - 46.0 %   MCV 82.4 78.0 - 100.0 fL   MCH 27.0 26.0 - 34.0 pg   MCHC 32.7 30.0 - 36.0 g/dL   RDW 19.0 (H) 11.5 - 15.5 %   Platelets 381 150 - 400 K/uL   Neutrophils Relative % 77 %   Neutro Abs 14.0 (H) 1.7 - 7.7 K/uL   Lymphocytes Relative 12 %   Lymphs Abs 2.1 0.7 - 4.0 K/uL   Monocytes Relative 10 %   Monocytes Absolute 1.8 (H) 0.1 - 1.0 K/uL   Eosinophils Relative 1 %   Eosinophils Absolute 0.2 0.0 - 0.7 K/uL   Basophils Relative 0 %   Basophils Absolute 0.0 0.0 - 0.1 K/uL  Procalcitonin     Status: None   Collection Time: 12/12/16  9:41 PM  Result Value Ref Range   Procalcitonin 0.18 ng/mL    Comment:  Interpretation: PCT (Procalcitonin) <= 0.5 ng/mL: Systemic infection (sepsis) is not likely. Local bacterial  infection is possible. (NOTE)         ICU PCT Algorithm               Non ICU PCT Algorithm    ----------------------------     ------------------------------         PCT < 0.25 ng/mL                 PCT < 0.1 ng/mL     Stopping of antibiotics            Stopping of antibiotics       strongly encouraged.               strongly encouraged.    ----------------------------     ------------------------------       PCT level decrease by               PCT < 0.25 ng/mL       >= 80% from peak PCT       OR PCT 0.25 - 0.5 ng/mL          Stopping of antibiotics                                             encouraged.     Stopping of antibiotics           encouraged.    ----------------------------     ------------------------------       PCT level decrease by              PCT >= 0.25 ng/mL       < 80% from peak PCT        AND PCT >= 0.5 ng/mL            Continuin g antibiotics                                              encouraged.       Continuing antibiotics            encouraged.    ----------------------------     ------------------------------     PCT level increase compared          PCT > 0.5 ng/mL         with peak PCT AND          PCT >= 0.5 ng/mL             Escalation of antibiotics                                          strongly encouraged.      Escalation of antibiotics        strongly encouraged.   I-stat troponin, ED     Status: None   Collection Time: 12/12/16  9:53 PM  Result Value Ref Range   Troponin i, poc 0.06 0.00 - 0.08 ng/mL   Comment 3            Comment: Due to the release kinetics of cTnI, a negative result within the  first hours of the onset of symptoms does not rule out myocardial infarction with certainty. If myocardial infarction is still suspected, repeat the test at appropriate intervals.   I-Stat CG4 Lactic Acid, ED     Status: None   Collection Time: 12/12/16  9:55 PM  Result Value Ref Range   Lactic Acid, Venous 1.30 0.5 - 1.9 mmol/L   Urinalysis, Routine w reflex microscopic     Status: Abnormal   Collection Time: 12/12/16 10:50 PM  Result Value Ref Range   Color, Urine YELLOW YELLOW   APPearance CLEAR CLEAR   Specific Gravity, Urine 1.015 1.005 - 1.030   pH 5.0 5.0 - 8.0   Glucose, UA NEGATIVE NEGATIVE mg/dL   Hgb urine dipstick NEGATIVE NEGATIVE   Bilirubin Urine NEGATIVE NEGATIVE   Ketones, ur 80 (A) NEGATIVE mg/dL   Protein, ur NEGATIVE NEGATIVE mg/dL   Nitrite NEGATIVE NEGATIVE   Leukocytes, UA NEGATIVE NEGATIVE  Strep pneumoniae urinary antigen     Status: None   Collection Time: 12/12/16 10:50 PM  Result Value Ref Range   Strep Pneumo Urinary Antigen NEGATIVE NEGATIVE    Comment:        Infection due to S. pneumoniae cannot be absolutely ruled out since the antigen present may be below the detection limit of the test.   Blood Culture (routine x 2)     Status: None (Preliminary result)   Collection Time: 12/13/16 12:49 AM  Result Value Ref Range   Specimen Description BLOOD LEFT ARM    Special Requests BOTTLES DRAWN AEROBIC AND ANAEROBIC 10CC    Culture      NO GROWTH 1 DAY Performed at [facility], [address] [place], [place] [address]    Report Status PENDING   Blood Culture (routine x 2)     Status: None (Preliminary result)   Collection Time: 12/13/16 12:51 AM  Result Value Ref Range   Specimen Description BLOOD RIGHT ANTECUBITAL    Special Requests BOTTLES DRAWN AEROBIC AND ANAEROBIC 10CC    Culture      NO GROWTH 1 DAY Performed at [facility], [place] [address], [place], [place] [address]    Report Status PENDING   Lactic acid, plasma     Status: None   Collection Time: 12/13/16 12:59 AM  Result Value Ref Range   Lactic Acid, Venous 0.8 0.5 - 1.9 mmol/L  MRSA PCR Screening     Status: None   Collection Time: 12/13/16  4:25 AM  Result Value Ref Range   MRSA by PCR NEGATIVE NEGATIVE    Comment:        The GeneXpert MRSA Assay (FDA approved for NASAL  specimens only), is one component of a comprehensive MRSA colonization surveillance program. It is not intended to diagnose MRSA infection nor to guide or monitor treatment for MRSA infections.   Creatinine, serum     Status: None   Collection Time: 12/14/16  5:54 AM  Result Value Ref Range   Creatinine, Ser 0.69 0.44 - 1.00 mg/dL   GFR calc non Af Amer >60 >60 mL/min   GFR calc Af Amer >60 >60 mL/min    Comment: (NOTE) The eGFR has been calculated using the CKD EPI equation. This calculation has not been validated in all clinical situations. eGFR's persistently <60 mL/min signify possible Chronic Kidney Disease.    Dg Chest 2 View  Result Date: 12/12/2016 CLINICAL DATA:  Nausea vomiting and abdominal pain EXAM: CHEST  2  VIEW COMPARISON:  12/08/2016 FINDINGS: Right upper extremity catheter has been removed. There are tiny bilateral effusions. No focal consolidation. Stable mild cardiomegaly. No pneumothorax. Partially visualized hardware within the spine. IMPRESSION: 1. Removal of right upper extremity catheter 2. Small bilateral effusions. Improved aeration at the bilateral lung bases 3. Mild cardiomegaly Electronically Signed   By: Donavan Foil M.D.   On: 12/12/2016 22:32   Ct Abdomen Pelvis W Contrast  Result Date: 12/13/2016 CLINICAL DATA:  Vomiting EXAM: CT ABDOMEN AND PELVIS WITH CONTRAST TECHNIQUE: Multidetector CT imaging of the abdomen and pelvis was performed using the standard protocol following bolus administration of intravenous contrast. CONTRAST:  11m ISOVUE-300 IOPAMIDOL (ISOVUE-300) INJECTION 61% COMPARISON:  12/06/2016, CT 11/23/2016 FINDINGS: Lower chest: Consolidation within the right lower lobe. Small bilateral pleural effusions. Dense mitral calcifications. Borderline to mild cardiomegaly. Hepatobiliary: No biliary dilatation. Surgical clips at the gallbladder fossa. Fluid density vague lesion adjacent to or within lateral segment of left hepatic lobe. Pancreas:  Unremarkable. No pancreatic ductal dilatation or surrounding inflammatory changes. Spleen: Normal in size without focal abnormality. Adrenals/Urinary Tract: Adrenal glands are within normal limits. Kidneys show no hydronephrosis. The bladder is normal. Stomach/Bowel: The stomach is nonenlarged. There is no dilated small bowel. No significant colon wall thickening. Vascular/Lymphatic: Atherosclerosis. Non aneurysmal aorta. No grossly enlarged lymph nodes Reproductive: Enhancing masses within the uterus with partial calcification, consistent with fibroids. No adnexal masses. Other: No free air. Trace free fluid in the pelvis. Diffuse subcutaneous edema. Ventral postsurgical scarring. Musculoskeletal: No acute osseous abnormality. Postsurgical changes of the lumbar spine. IMPRESSION: 1. Small bilateral pleural effusions. There is dense consolidation in the right lower lobe which may reflect atelectasis, pneumonia or aspiration 2. There is no evidence for small bowel dilatation or evidence of a small bowel obstruction. 3. Small free fluid in the pelvis 4. Enhancing uterine masses suggesting fibroids. Electronically Signed   By: KDonavan FoilM.D.   On: 12/13/2016 00:02   Dg Swallowing Func-speech Pathology  Result Date: 12/13/2016 Objective Swallowing Evaluation: Type of Study: MBS-Modified Barium Swallow Study Patient Details Name: CERMINIA MCNEWMRN: [id number] of Birth: 421-Feb-1937Today's Date: 12/13/2016 Time: SLP Start Time (ACUTE ONLY): 1330-SLP Stop Time (ACUTE ONLY): 1340 SLP Time Calculation (min) (ACUTE ONLY): 10 min Past Medical History: Past Medical History: Diagnosis Date . Anxiousness  . COPD (chronic obstructive pulmonary disease) ([place]  . Hyperlipidemia  . Hypertension  . Small bowel obstruction due to adhesions 11/17/2016 . Uterine fibroid  Past Surgical History: Past Surgical History: Procedure Laterality Date . BACK SURGERY    X2 . bone removed from skull Right 1957 . CHOLECYSTECTOMY N/A 10/30/2016   Procedure: LAPAROSCOPIC CHOLECYSTECTOMY WITH INTRAOPERATIVE CHOLANGIOGRAM;  Surgeon: MJohnathan Hausen MD;  Location: [facility];  Service: General;  Laterality: N/A; . JOINT REPLACEMENT    knee . LAPAROTOMY N/A 11/17/2016  Procedure: EXPLORATORY LAPAROTOMY WITH LYSIS OF ADHESIONS;  Surgeon: HFanny Skates MD;  Location: [facility];  Service: General;  Laterality: N/A; . LAPAROTOMY N/A 11/24/2016  Procedure: RE DO EXPLORATORY LAPAROTOMY;  Surgeon: TJackolyn Confer MD;  Location: [facility];  Service: General;  Laterality: N/A; HPI: 81y.o. female with medical history significant of hypertension, hyperlipidemia, COPD, GERD, depression, uterine fibroids, small bowel obstruction, who presents with shortness breath, nausea, vomiting, diarrhea and abdominal pain.   Subjective: pt awake in chair Assessment / Plan / Recommendation CHL IP CLINICAL IMPRESSIONS 12/13/2016 Clinical Impression Pt presents with minimal oral deficits resulting in delayed oral transiting and piecemealing.  Suspect this  is due to pt's h/o nausea/vomiting as she confirms oral deficits not present prior to N/V/D issues.  Pharyngeal swallow was timely and strong without residuals nor aspiration/penetration.    Of note, pt admits to problems with vomiting after meals and states food "balls up in mouth".  Recommend diet as Md indicates. No SLP folllow up indicated.  Thanks for referral.  SLP Visit Diagnosis Dysphagia, oral phase (R13.11) Attention and concentration deficit following -- Frontal lobe and executive function deficit following -- Impact on safety and function Mild aspiration risk   No flowsheet data found.  No flowsheet data found. CHL IP DIET RECOMMENDATION 12/13/2016 SLP Diet Recommendations (No Data) Liquid Administration via Cup;Straw Medication Administration Whole meds with liquid Compensations Slow rate;Small sips/bites Postural Changes Remain semi-upright after after feeds/meals (Comment)   CHL IP OTHER RECOMMENDATIONS 12/13/2016 Recommended Consults --  Oral Care Recommendations Oral care BID Other Recommendations --   CHL IP FOLLOW UP RECOMMENDATIONS 12/13/2016 Follow up Recommendations None        CHL IP ORAL PHASE 12/13/2016 Oral Phase -- Oral - Pudding Teaspoon -- Oral - Pudding Cup -- Oral - Honey Teaspoon -- Oral - Honey Cup -- Oral - Nectar Teaspoon -- Oral - Nectar Cup Delayed oral transit;Premature spillage Oral - Nectar Straw Delayed oral transit;Premature spillage Oral - Thin Teaspoon -- Oral - Thin Cup Delayed oral transit;Premature spillage Oral - Thin Straw Premature spillage;Delayed oral transit Oral - Puree Delayed oral transit;Premature spillage;Lingual pumping Oral - Mech Soft Delayed oral transit;Premature spillage;Lingual pumping Oral - Regular -- Oral - Multi-Consistency -- Oral - Pill -- Oral Phase - Comment --  CHL IP PHARYNGEAL PHASE 12/13/2016 Pharyngeal Phase WFL Pharyngeal- Pudding Teaspoon -- Pharyngeal -- Pharyngeal- Pudding Cup -- Pharyngeal -- Pharyngeal- Honey Teaspoon -- Pharyngeal -- Pharyngeal- Honey Cup -- Pharyngeal -- Pharyngeal- Nectar Teaspoon -- Pharyngeal -- Pharyngeal- Nectar Cup WFL Pharyngeal -- Pharyngeal- Nectar Straw -- Pharyngeal -- Pharyngeal- Thin Teaspoon -- Pharyngeal -- Pharyngeal- Thin Cup WFL Pharyngeal -- Pharyngeal- Thin Straw WFL Pharyngeal -- Pharyngeal- Puree WFL Pharyngeal -- Pharyngeal- Mechanical Soft WFL Pharyngeal -- Pharyngeal- Regular -- Pharyngeal -- Pharyngeal- Multi-consistency -- Pharyngeal -- Pharyngeal- Pill -- Pharyngeal -- Pharyngeal Comment --  CHL IP CERVICAL ESOPHAGEAL PHASE 12/13/2016 Cervical Esophageal Phase WFL Pudding Teaspoon -- Pudding Cup -- Honey Teaspoon -- Honey Cup -- Nectar Teaspoon -- Nectar Cup -- Nectar Straw -- Thin Teaspoon -- Thin Cup -- Thin Straw -- Puree -- Mechanical Soft -- Regular -- Multi-consistency -- Pill -- Cervical Esophageal Comment -- No flowsheet data found. Luanna Salk, MS [facility] SLP [phone]              Anti-infectives    Start     Dose/Rate Route  Frequency Ordered Stop   12/13/16 2200  vancomycin (VANCOCIN) IVPB 1000 mg/200 mL premix     1,000 mg 200 mL/hr over 60 Minutes Intravenous Every 24 hours 12/13/16 1241     12/13/16 1800  Ampicillin-Sulbactam (UNASYN) 3 g in sodium chloride 0.9 % 100 mL IVPB     3 g 200 mL/hr over 30 Minutes Intravenous Every 6 hours 12/13/16 1232     12/13/16 1200  vancomycin (VANCOCIN) IVPB 750 mg/150 ml premix  Status:  Discontinued     750 mg 150 mL/hr over 60 Minutes Intravenous Every 12 hours 12/13/16 0200 12/13/16 1241   12/13/16 0200  azithromycin (ZITHROMAX) 500 mg in dextrose 5 % 250 mL IVPB  Status:  Discontinued     500 mg 250  mL/hr over 60 Minutes Intravenous Every 24 hours 12/13/16 0046 12/13/16 1159   12/13/16 0100  piperacillin-tazobactam (ZOSYN) IVPB 3.375 g  Status:  Discontinued     3.375 g 12.5 mL/hr over 240 Minutes Intravenous Every 8 hours 12/13/16 0043 12/13/16 1200   12/13/16 0030  piperacillin-tazobactam (ZOSYN) IVPB 4.5 g  Status:  Discontinued     4.5 g 200 mL/hr over 30 Minutes Intravenous  Once 12/13/16 0026 12/13/16 0027   12/13/16 0030  piperacillin-tazobactam (ZOSYN) IVPB 3.375 g  Status:  Discontinued     3.375 g 12.5 mL/hr over 240 Minutes Intravenous  Once 12/13/16 0027 12/13/16 0740   12/13/16 0015  ceFEPIme (MAXIPIME) 2 g in dextrose 5 % 50 mL IVPB  Status:  Discontinued     2 g 100 mL/hr over 30 Minutes Intravenous  Once 12/13/16 0008 12/13/16 0026   12/13/16 0015  vancomycin (VANCOCIN) IVPB 1000 mg/200 mL premix     1,000 mg 200 mL/hr over 60 Minutes Intravenous  Once 12/13/16 0008 12/13/16 0202        Assessment/Plan Persistent abdominal pain, nausea, vomiting, anorexia - multiple recent operations: 1. S/p Laparoscopic Cholecystectomy with IOC for necrotic gallbladder, 10/30/16 Dr. Hassell Done 2. S/p Exploratory laparotomy, lysis of adhesions for SBO, 11/17/16 Dr. Dalbert Batman 3. S/p Emergency exploratory laparotomy, lysis of adhesions, reduction of volvulus, 11/24/16  Dr. Zella Richer. Findings: Recurrent small bowel obstruction. 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 Ronnald Ramp' abdominal pain. She does not appear to have a postoperative infection or recurrent SBO on CT scan. She could have a worsening ileus related to malnutrition and pneumonia? Could consider asking GI to consult on patient again to see if they have any further recommendations?  [name], [facility] Surgery 12/14/2016, 4:31 PM Pager: [phone] Consults: [phone] Mon-Fri 7:00 am-4:30 pm Sat-Sun 7:00 am-11:30 am

## 2016-12-15 DIAGNOSIS — J189 Pneumonia, unspecified organism: Secondary | ICD-10-CM

## 2016-12-15 DIAGNOSIS — Z515 Encounter for palliative care: Secondary | ICD-10-CM

## 2016-12-15 DIAGNOSIS — Z7189 Other specified counseling: Secondary | ICD-10-CM

## 2016-12-15 LAB — CBC
HCT: 24.4 % — ABNORMAL LOW (ref 36.0–46.0)
Hemoglobin: 8 g/dL — ABNORMAL LOW (ref 12.0–15.0)
MCH: 27.8 pg (ref 26.0–34.0)
MCHC: 32.8 g/dL (ref 30.0–36.0)
MCV: 84.7 fL (ref 78.0–100.0)
PLATELETS: 308 10*3/uL (ref 150–400)
RBC: 2.88 MIL/uL — ABNORMAL LOW (ref 3.87–5.11)
RDW: 18.7 % — AB (ref 11.5–15.5)
WBC: 23.3 10*3/uL — ABNORMAL HIGH (ref 4.0–10.5)

## 2016-12-15 LAB — BASIC METABOLIC PANEL
Anion gap: 8 (ref 5–15)
BUN: 15 mg/dL (ref 6–20)
CALCIUM: 8.2 mg/dL — AB (ref 8.9–10.3)
CO2: 27 mmol/L (ref 22–32)
Chloride: 107 mmol/L (ref 101–111)
Creatinine, Ser: 1.03 mg/dL — ABNORMAL HIGH (ref 0.44–1.00)
GFR calc Af Amer: 59 mL/min — ABNORMAL LOW (ref >60)
GFR, EST NON AFRICAN AMERICAN: 51 mL/min — AB (ref >60)
GLUCOSE: 128 mg/dL — AB (ref 65–99)
Potassium: 2.4 mmol/L — CL (ref 3.5–5.1)
Sodium: 142 mmol/L (ref 135–145)

## 2016-12-15 LAB — LEGIONELLA PNEUMOPHILA SEROGP 1 UR AG: L. pneumophila Serogp 1 Ur Ag: NEGATIVE

## 2016-12-15 MED ORDER — POLYETHYLENE GLYCOL 3350 17 G PO PACK
17.0000 g | PACK | Freq: Every day | ORAL | Status: DC
  Administered 2016-12-15 – 2016-12-16 (×2): 17 g via ORAL
  Filled 2016-12-15 (×2): qty 1

## 2016-12-15 MED ORDER — PRO-STAT SUGAR FREE PO LIQD
30.0000 mL | Freq: Two times a day (BID) | ORAL | Status: DC
  Filled 2016-12-15: qty 30

## 2016-12-15 MED ORDER — POTASSIUM CHLORIDE 2 MEQ/ML IV SOLN: 30.0000 meq | INTRAVENOUS | Status: DC

## 2016-12-15 MED ORDER — POTASSIUM CHLORIDE CRYS ER 20 MEQ PO TBCR
40.0000 meq | EXTENDED_RELEASE_TABLET | Freq: Once | ORAL | Status: AC
  Administered 2016-12-15: 40 meq via ORAL
  Filled 2016-12-15: qty 2

## 2016-12-15 MED ORDER — UNJURY CHICKEN SOUP POWDER
8.0000 [oz_av] | Freq: Three times a day (TID) | ORAL | Status: DC
  Filled 2016-12-15 (×8): qty 27

## 2016-12-15 MED ORDER — DEXTROSE-NACL 5-0.9 % IV SOLN: INTRAVENOUS | Status: DC

## 2016-12-15 MED ORDER — POTASSIUM CHLORIDE CRYS ER 20 MEQ PO TBCR
40.0000 meq | EXTENDED_RELEASE_TABLET | ORAL | Status: AC
  Administered 2016-12-15 (×2): 40 meq via ORAL
  Filled 2016-12-15 (×3): qty 2

## 2016-12-15 MED ORDER — MAGNESIUM SULFATE 2 GM/50ML IV SOLN
2.0000 g | Freq: Once | INTRAVENOUS | Status: AC
  Administered 2016-12-15: 2 g via INTRAVENOUS
  Filled 2016-12-15: qty 50

## 2016-12-15 MED ORDER — POTASSIUM CHLORIDE 10 MEQ/100ML IV SOLN
10.0000 meq | INTRAVENOUS | Status: DC
  Administered 2016-12-15: 10 meq via INTRAVENOUS
  Filled 2016-12-15 (×6): qty 100

## 2016-12-15 MED ORDER — KCL IN DEXTROSE-NACL 40-5-0.9 MEQ/L-%-% IV SOLN
INTRAVENOUS | Status: DC
  Administered 2016-12-15 – 2016-12-17 (×4): via INTRAVENOUS
  Filled 2016-12-15 (×7): qty 1000

## 2016-12-15 NOTE — Progress Notes (Signed)
Nutrition Follow-up  DOCUMENTATION CODES:   Severe malnutrition in context of acute illness/injury, Severe malnutrition in context of chronic illness  INTERVENTION:  - Will order Un[place] chicken soup TID, each supplement provides 100 kcal and 21 grams of protein. - Will order 30 mL Prostat BID, each supplement provides 100 kcal and 15 grams of protein. - Continue FLD and continue to advance diet if feasible, within POC/GOC. - RD will continue to monitor for needs.  NUTRITION DIAGNOSIS:   Malnutrition related to acute illness, chronic illness as evidenced by moderate depletions of muscle mass, energy intake < or equal to 50% for > or equal to 5 days, percent weight loss. -ongoing  GOAL:   Patient will meet greater than or equal to 90% of their needs -unmet  MONITOR:   PO intake, Supplement acceptance, Diet advancement, Weight trends, Labs, Skin  ASSESSMENT:   81 y.o. female with medical history significant of hypertension, hyperlipidemia, COPD, GERD, depression, uterine fibroids, small bowel obstruction, who presents with shortness breath, nausea, vomiting, diarrhea and abdominal pain.  3/9 Diet advanced from NPO to CLD 3/7 @ 1845 and diet further advanced from CLD to [place] today at 1020. Spoke with RN who reports that pt is not consuming much and only had a cup of broth yesterday. She also reports wound appears to be worsening. RN interested in oral nutrition supplements being ordered for pt. Will order as outlined above; pt was willing to consume Prostat mixed in juice during previous admission after RD had discussion with her about the importance of protein. She is unwilling to drink Ensure d/t discomfort it causes her. RN reports consult in place to Palliative. No new weight since admission. Very brief visit with pt as family arrived and wanted to talk with pt to provide her with encouragement and supportive words; pt had indicated she is feeling very unwell today but unable to specify  any one thing that is bothering her.   Medications reviewed; 100 mg Colace BID, 2 g IV Mg sulfate x1 dose today, daily multivitamin with minerals, 40 mg oral Protonix/day, 17 g Miralax/day, 40 mEq oral KCl x4 doses today. Labs reviewed; K: 2.4 mmol/L, creatinine: 1.03 mg/dL, Ca: 8.2 mg/dL, GFR: 51 mL/min.  IVF: D5-NS-40 mEq KCl @ 75 mL/hr (306 kcal).   3/7 - Pt has been NPO since admission.  - RD familiar with pt from previous prolonged hospitalization with TPN requirement at that time.  - She reports that since d/c on 3/1 she has been experiencing N/V and that vomiting occurred with most PO intakes. - Denies it being related to any one food or beverage item in particular. - During that time, many consuming bites and sips.  - She denies nausea at this time and states that she has not had any vomiting today.  - Physical assessment shows moderate muscle wasting around clavicle and shoulder areas - She has moderate edema to BLE and mild edema to LUE.  - Per chart review, pt has lost 14 lbs (8% body weight) in the past 2 weeks which is significant for time frame.  - This may be fluid related as pt had been volume overloaded on previous admission.  - She meets criteria for malnutrition based on muscle wasting and <50% energy intake for >/= 5 days.  IVF: NS @ 75 mL/hr.     Diet Order:  Diet full liquid Room service appropriate? Yes; Fluid consistency: Thin  Skin:   Stage 2 R buttocks pressure injury  Last BM:  3/6  Height:   Ht Readings from Last 1 Encounters:  12/13/16 5\' 2"  (1.575 m)    Weight:   Wt Readings from Last 1 Encounters:  12/13/16 160 lb 11.5 oz (72.9 kg)    Ideal Body Weight:  50 kg  BMI:  Body mass index is 29.4 kg/m.  Estimated Nutritional Needs:   Kcal:  1600-1820 (22-25 kcal/kg)  Protein:  73-87 grams (1-1.2 grams/kg)  Fluid:  1.6-1.8 L/day  EDUCATION NEEDS:   No education needs identified at this time    Jarome Matin, MS, RD, LDN,  [facility] Inpatient Clinical Dietitian Pager # [phone] After hours/weekend pager # [phone]

## 2016-12-15 NOTE — Progress Notes (Signed)
Patient and daughter have been educated several times re: skin care and necessity of turning every few hours to relieve pressure on patient's skin.  Patient has refused turns today. She is able to turn herself (1 assist to the bedside commode).  Have attempted to reeducated patient and daughter; need reinforcement.  At this time patient states she is more comfortable in a supine position d/t her extreme constant abdominal discomfort and nausea.  Will continue to monitor patient.

## 2016-12-15 NOTE — Progress Notes (Signed)
Patient "timed out" of C. Diff testing/precautions per IP d/t no BM since admission.  Have d/c precautions and C.Diff labs.

## 2016-12-15 NOTE — Consult Note (Signed)
Consultation Note Date: 12/15/2016   Patient Name: Melanie [name]  DOB: May 08, 1936  MRN: [id number]  Age / Sex: 81 y.o., female  PCP: Melanie Pillar, MD Referring Physician: Barton Dubois, MD  Reason for Consultation: Establishing goals of care  HPI/Patient Profile: 81 y.o. female   admitted on 12/12/2016    Clinical Assessment and Goals of Care:  81 y.o.femalewith medical history significant of hypertension, hyperlipidemia, COPD, GERD, depression, uterine fibroids, small bowel obstruction, who presents with shortness breath, nausea, vomiting, diarrhea and abdominal pain. Pt was recently admitted tohospital 10/28/16-12/09/16. She underwent several complicated abdominal surgeries, including laproscopic cholecystectomy, SBO andlysis of adhesion and reduction of volvulus. She was discharged to rehabilitation facility. Per her daughter, patient pt has been having nausea, vomiting, abdominal pain and diarrhea in the past 4 days. Patient vomitseach time when she eats food.        Patient found to have HCAP Vs aspiration PNA; admitted for further evaluation and treatment.   Patient is known to palliative service from last hospitalization. A palliative consult has been requested for ongoing goals of care discussions.   The patient appears to be much more weak,deconditioned than my last visit with her. She opens her eyes, does not respond much. Even when resting with eyes closed, she appears to have non verbal gestures of distress and discomfort. Daughter HCPOA agent Melanie [name] is present at the bedside.   We discussed about the patient's current condition, her last hospitalization, her ongoing functional decline, lack of eating all signaling her getting ready to enter into the final chapter of her life. I discussed frankly with Melanie [name] that on my last visit, the patient was clear about hospice oriented goals. Daughter becomes  tearful and states that all 4 children had wanted to encourage the patient to eat more, participate with therapy and try SNF. Melanie [name] states that she can now see clearly that the patient likely does not have much longer to live.   We discussed about code status and hospice philosophy of care again in great detail, all questions answered, I will follow up again tomorrow. Thank you for the consult.    HCPOA  daughter Melanie [name].  Has 4 children, one lives in [place], 3 live in [place]/ [place]    1. Code status now established as DNR DNI.  2. We discussed in detail about residential hospice and full comfort measures. Several family members are arriving from out of town tonight, [place] daughter Melanie [name] wishes to think about it further, follow up in am.   Code Status/Advance Care Planning:  DNR    Symptom Management:     As above  Palliative Prophylaxis:   Bowel Regimen  Psycho-social/Spiritual:   Desire for further Chaplaincy support:yes  Additional Recommendations: Education on Hospice  Prognosis:   < 2 weeks  Discharge Planning: To Be Determined      Primary Diagnoses: Present on Admission: . Aspiration pneumonia ([place]) . Essential hypertension . Hyperlipidemia . COPD (chronic obstructive pulmonary disease) ([place]) . GERD (  gastroesophageal reflux disease) . Nausea vomiting and diarrhea . Sepsis ([place]) . Acute encephalopathy   I have reviewed the medical record, interviewed the patient and family, and examined the patient. The following aspects are pertinent.  Past Medical History:  Diagnosis Date  . Anxiousness   . COPD (chronic obstructive pulmonary disease) ([place])   . Hyperlipidemia   . Hypertension   . Small bowel obstruction due to adhesions 11/17/2016  . Uterine fibroid    Social History   Social History  . Marital status: Married    Spouse name: N/A  . Number of children: N/A  . Years of education: N/A   Social  History Main Topics  . Smoking status: Former Smoker    Quit date: 09/08/1998  . Smokeless tobacco: Never Used  . Alcohol use 0.0 oz/week     Comment: occasional  . Drug use: No  . Sexual activity: Not Asked   Other Topics Concern  . None   Social History Narrative  . None   Family History  Problem Relation Age of Onset  . Heart disease Mother     before age 64   Scheduled Meds: . amLODipine  10 mg Oral Daily  . ampicillin-sulbactam (UNASYN) IV  3 g Intravenous Q6H  . aspirin EC  81 mg Oral Daily  . carvedilol  12.5 mg Oral BID WC  . cloNIDine  0.1 mg Oral Daily  . docusate sodium  100 mg Oral BID  . enoxaparin (LOVENOX) injection  40 mg Subcutaneous Q24H  . feeding supplement (PRO-STAT SUGAR FREE 64)  30 mL Oral BID  . hydrALAZINE  50 mg Oral QID  . losartan  50 mg Oral Daily  . mirtazapine  15 mg Oral QHS  . multivitamins with iron  1 tablet Oral Daily  . pantoprazole  40 mg Oral Daily  . polyethylene glycol  17 g Oral Daily  . potassium chloride  40 mEq Oral Q4H  . protein supplement  8 oz Oral TID  . vancomycin  1,000 mg Intravenous Q24H   Continuous Infusions: . dextrose 5 % and 0.9 % NaCl with KCl 40 mEq/L     PRN Meds:.acetaminophen, albuterol, bisacodyl, hydrALAZINE, hyoscyamine, morphine injection, ondansetron, oxyCODONE Medications Prior to Admission:  Prior to Admission medications   Medication Sig Start Date End Date Taking? Authorizing Provider  acetaminophen (TYLENOL) 325 MG tablet Take 650 mg by mouth every 6 (six) hours as needed.   Yes Historical Provider, MD  amLODipine (NORVASC) 10 MG tablet Take 1 tablet (10 mg total) by mouth daily. 12/08/16  Yes Jennifer Chahn-Yang Choi, DO  aspirin EC 81 MG tablet Take 81 mg by mouth daily.   Yes Historical Provider, MD  carvedilol (COREG) 12.5 MG tablet Take 1 tablet (12.5 mg total) by mouth 2 (two) times daily with a meal. 12/08/16  Yes Jennifer Chahn-Yang Choi, DO  chlorthalidone (HYGROTON) 25 MG tablet Take 2  tablets (50 mg total) by mouth daily. 12/08/16  Yes Jennifer Chahn-Yang Choi, DO  cloNIDine (CATAPRES) 0.1 MG tablet Take 0.1 mg by mouth daily.    Yes Historical Provider, MD  hydrALAZINE (APRESOLINE) 50 MG tablet Take 1 tablet (50 mg total) by mouth 4 (four) times daily. 12/07/16  Yes Jennifer Chahn-Yang Choi, DO  losartan (COZAAR) 50 MG tablet Take 1 tablet (50 mg total) by mouth daily. 12/08/16  Yes Jennifer Chahn-Yang Choi, DO  mirtazapine (REMERON SOL-TAB) 15 MG disintegrating tablet Take 15 mg by mouth at bedtime.   Yes Historical  Provider, MD  Multiple Vitamins-Iron (MULTIVITAMINS WITH IRON) TABS tablet Take 1 tablet by mouth daily. 12/08/16  Yes Earnstine Regal, PA-C  omeprazole (PRILOSEC) 40 MG capsule Take 40 mg by mouth 2 (two) times daily.    Yes Historical Provider, MD  oxyCODONE (OXY IR/ROXICODONE) 5 MG immediate release tablet Take 5-10 mg by mouth every 4 (four) hours as needed for severe pain.   Yes Historical Provider, MD  promethazine (PHENERGAN) 25 MG suppository Place 25 mg rectally every 6 (six) hours as needed for nausea or vomiting.   Yes Historical Provider, MD  promethazine (PHENERGAN) 25 MG/ML injection Inject 25 mg into the vein once.   Yes Historical Provider, MD   Allergies  Allergen Reactions  . Nsaids     hypertension  . Ace Inhibitors Other (See Comments)    cough   Review of Systems + for pain.   Physical Exam Weak chronically ill S1 S2 Shallow breathing Abdomen soft, mild generalized tenderness No edema Reportedly now with stage II sacral ulcer, present on admission  Vital Signs: BP (!) 140/52 (BP Location: Left Arm)   Pulse 83   Temp 98.4 F (36.9 C) (Oral)   Resp 16   Ht 5\' 2"  (1.575 m)   Wt 72.9 kg (160 lb 11.5 oz)   SpO2 98%   BMI 29.40 kg/m  Pain Assessment: 0-10   Pain Score: 8    SpO2: SpO2: 98 % O2 Device:SpO2: 98 % O2 Flow Rate: .O2 Flow Rate (L/min): 2 L/min  IO: Intake/output summary:  Intake/Output Summary (Last 24 hours) at  12/15/16 1435 Last data filed at 12/15/16 4128  Gross per 24 hour  Intake             2150 ml  Output              750 ml  Net             1400 ml    LBM: Last BM Date: 12/12/16 Baseline Weight: Weight: 82.1 kg (181 lb) Most recent weight: Weight: 72.9 kg (160 lb 11.5 oz)     Palliative Assessment/Data:   Flowsheet Rows   Flowsheet Row Most Recent Value  Intake Tab  Referral Department  Hospitalist  Unit at Time of Referral  Med/Surg Unit  Palliative Care Primary Diagnosis  Other (Comment)  Palliative Care Type  Return patient Palliative Care  Reason for referral  Clarify Goals of Care, End of Life Care Assistance  Clinical Assessment  Palliative Performance Scale Score  20%  Pain Max last 24 hours  6  Pain Min Last 24 hours  4  Dyspnea Max Last 24 Hours  4  Dyspnea Min Last 24 hours  3  Psychosocial & Spiritual Assessment  Palliative Care Outcomes  Patient/Family meeting held?  Yes  Who was at the meeting?  patient daughter Nadara Eaton   Palliative Care Outcomes  Clarified goals of care      Time In:  1315 Time Out:  1425 Time Total:  70 minutes  Greater than 50%  of this time was spent counseling and coordinating care related to the above assessment and plan.  Signed by: Loistine Chance, MD  [phone]  Please contact Palliative Medicine Team phone at [phone] for questions and concerns.  For individual provider: See Shea Evans

## 2016-12-15 NOTE — Progress Notes (Signed)
Patient has voided 159ml since 7am.  Bladder scanner showed <100 ml in bladder at this time.  Dr. Dyann Kief aware.  Will continue to monitor.

## 2016-12-15 NOTE — Progress Notes (Signed)
CRITICAL VALUE ALERT  Critical value received:  Potassium 2.4  Date of notification: 12/15/16  Time of notification:  0615  Critical value read back:yes  Nurse who received alert: Dellie Catholic  MD notified (1st page):  yes  Time of first page: 0620

## 2016-12-15 NOTE — Progress Notes (Signed)
Patient ID: Melanie [name], female   DOB: 09-05-1936, 81 y.o.   MRN: [id number]  [facility] Progress Note     Subjective: No change in abdominal pain. Still unable to tolerate PO intake.  Patient met with palliative care this morning, she is now DNR/DNI. More family arriving tonight to further discuss goals of care.  Objective: Vital signs in last 24 hours: Temp:  [98.4 F (36.9 C)-99.5 F (37.5 C)] 98.4 F (36.9 C) (03/09 0631) Pulse Rate:  [83-92] 83 (03/09 0631) Resp:  [16] 16 (03/09 0631) BP: (140-149)/(47-52) 140/52 (03/09 0631) SpO2:  [96 %-98 %] 98 % (03/09 0631) Last BM Date: 12/12/16  Intake/Output from previous day: 03/08 0701 - 03/09 0700 In: 2250 [P.O.:150; I.V.:1500; IV Piggyback:600] Out: 1050 [Urine:1050] Intake/Output this shift: No intake/output data recorded.  PE: Gen:  Alert, NAD, tired Pulm:  Effort normal Abd: multiple lap and midline incision cdi with no erythema or drainage, soft, ND, +BS, no masses, hernias, or organomegaly. Diffuse tenderness with voluntary guarding. No rebound. Ext:  No erythema, edema, or tenderness  Lab Results:   Recent Labs  12/12/16 2141 12/15/16 0509  WBC 18.1* 23.3*  HGB 8.9* 8.0*  HCT 27.2* 24.4*  PLT 381 308   BMET  Recent Labs  12/12/16 2141 12/14/16 0554 12/15/16 0509  NA 140  --  142  K 3.9  --  2.4*  CL 102  --  107  CO2 24  --  27  GLUCOSE 95  --  128*  BUN 16  --  15  CREATININE 1.03* 0.69 1.03*  CALCIUM 7.8*  --  8.2*   PT/INR No results for input(s): LABPROT, INR in the last 72 hours. CMP     Component Value Date/Time   NA 142 12/15/2016 0509   K 2.4 (LL) 12/15/2016 0509   CL 107 12/15/2016 0509   CO2 27 12/15/2016 0509   GLUCOSE 128 (H) 12/15/2016 0509   BUN 15 12/15/2016 0509   CREATININE 1.03 (H) 12/15/2016 0509   CALCIUM 8.2 (L) 12/15/2016 0509   PROT 7.1 12/12/2016 2141   ALBUMIN 2.5 (L) 12/12/2016 2141   AST 30 12/12/2016 2141   ALT 21 12/12/2016 2141   ALKPHOS  116 12/12/2016 2141   BILITOT 1.5 (H) 12/12/2016 2141   GFRNONAA 51 (L) 12/15/2016 0509   GFRAA 59 (L) 12/15/2016 0509   Lipase     Component Value Date/Time   LIPASE 22 12/12/2016 2141       Studies/Results: No results found.  Anti-infectives: Anti-infectives    Start     Dose/Rate Route Frequency Ordered Stop   12/13/16 2200  vancomycin (VANCOCIN) IVPB 1000 mg/200 mL premix     1,000 mg 200 mL/hr over 60 Minutes Intravenous Every 24 hours 12/13/16 1241     12/13/16 1800  Ampicillin-Sulbactam (UNASYN) 3 g in sodium chloride 0.9 % 100 mL IVPB     3 g 200 mL/hr over 30 Minutes Intravenous Every 6 hours 12/13/16 1232     12/13/16 1200  vancomycin (VANCOCIN) IVPB 750 mg/150 ml premix  Status:  Discontinued     750 mg 150 mL/hr over 60 Minutes Intravenous Every 12 hours 12/13/16 0200 12/13/16 1241   12/13/16 0200  azithromycin (ZITHROMAX) 500 mg in dextrose 5 % 250 mL IVPB  Status:  Discontinued     500 mg 250 mL/hr over 60 Minutes Intravenous Every 24 hours 12/13/16 0046 12/13/16 1159   12/13/16 0100  piperacillin-tazobactam (ZOSYN) IVPB 3.375 g  Status:  Discontinued     3.375 g 12.5 mL/hr over 240 Minutes Intravenous Every 8 hours 12/13/16 0043 12/13/16 1200   12/13/16 0030  piperacillin-tazobactam (ZOSYN) IVPB 4.5 g  Status:  Discontinued     4.5 g 200 mL/hr over 30 Minutes Intravenous  Once 12/13/16 0026 12/13/16 0027   12/13/16 0030  piperacillin-tazobactam (ZOSYN) IVPB 3.375 g  Status:  Discontinued     3.375 g 12.5 mL/hr over 240 Minutes Intravenous  Once 12/13/16 0027 12/13/16 0740   12/13/16 0015  ceFEPIme (MAXIPIME) 2 g in dextrose 5 % 50 mL IVPB  Status:  Discontinued     2 g 100 mL/hr over 30 Minutes Intravenous  Once 12/13/16 0008 12/13/16 0026   12/13/16 0015  vancomycin (VANCOCIN) IVPB 1000 mg/200 mL premix     1,000 mg 200 mL/hr over 60 Minutes Intravenous  Once 12/13/16 0008 12/13/16 0202       Assessment/Plan Persistent abdominal pain, nausea,  vomiting, anorexia; Ileus? - multiple recent operations: 1. S/p Laparoscopic Cholecystectomy with IOC for necrotic gallbladder, 10/30/16 Dr. Hassell Done 2. S/p Exploratory laparotomy, lysis of adhesions for SBO, 11/17/16 Dr. Dalbert Batman 3. S/p Emergency exploratory laparotomy, lysis of adhesions, reduction of volvulus, 11/24/16 Dr. Zella Richer. Findings: Recurrent small bowel obstruction. Mesenteric volvulus withtransientjejunal segment ischemia  - CT 3/6 does not show any signs of postoperative complications including infection or obstruction - pneumonia possibly making ileus worse?  Aspiration pneumonia vs HCAP and sepsis HTN HLD COPD GERD Chronic pain Depression  FEN - IVF, full liquids  Plan - continue to recommend no surgical intervention.  Patient met with palliative care this morning to establish goals of care. More family arriving tonight for further discussion of wishes. Code status now DNR/DNI. May need to consider TPN if PO intake does not improve and patient wishes to pursue this.   LOS: 2 days    [name] , [facility] 12/15/2016, 2:48 PM Pager: [phone] Consults: [phone] Mon-Fri 7:00 am-4:30 pm Sat-Sun 7:00 am-11:30 am

## 2016-12-15 NOTE — Progress Notes (Signed)
[facility] PROGRESS NOTE  Melanie [name] CZY:[id number] DOB: 1936-02-10 DOA: 12/12/2016 PCP: Osborne Casco, MD  Interim summary and HPI 81 y.o. female with medical history significant of hypertension, hyperlipidemia, COPD, GERD, depression, uterine fibroids, small bowel obstruction, who presents with shortness breath, nausea, vomiting, diarrhea and abdominal pain. Pt was recently admitted to hospital 10/28/16-12/09/16. She underwent several complicated abdominal surgeries, including laproscopic cholecystectomy, SBO and lysis of adhesion and reduction of volvulus. She was discharged to rehabilitation facility. Per her daughter, patient pt has been having nausea, vomiting, abdominal pain and diarrhea in the past 4 days. Patient vomits each time when she eats food.        Patient found to have HCAP Vs aspiration PNA; admitted for further evaluation and treatment.   Assessment/Plan: Aspiration pneumonia vs HCAP and sepsis: Patient has a shortness breath and CT scan of abdomen/pelvis showed right lower lobe consolidation, indicating possible pneumonia, aspiration versus HCAP.  -Pt met criteria for sepsis with leukocytosis, hypothermia, heart rate >90 on admission and acute encephalopathy. CXR demonstrating PNA. -continue IV antibiotics for now -SLP w/o significant risk for aspiration (which means that if patient aspirated; was during vomiting events) -continue neb treatment as needed  -continue supportive care -follow blood cx and sputum culture  Acute encephalopathy: Likely secondary to pneumonia and sepsis -Treat underlying issues -follow clinical response and continue supportive care -patient more alert and with better insight   Nausea vomiting, diarrhea and abdominal pain: Etiology is not clear. CT abdomen/pelvis did not show acute issues.  -Lipase normal.  -Her abdominal pain might be related to recent complicated abdominal surgery; seen by CCS, appreciate opinion and  recommendations. Essentially not evidence of acute surgical complication. -no further diarrhea now for 48 hours; C. Diff unable to be collected (time out). Will monitor  -continue IVF's resuscitation, encourage PO intake, continue supportive care and follow clinical response   HTN: -continue amlodipine, Coreg, clonidine, hydralazine, Cozaar -Holding diuretics given acute setting of dehydration/sepsis. -BP stable  GERD: -continue Protonix  COPD (chronic obstructive pulmonary disease) ([place]):  -continue PRN albuterol nebulizer treatment -no wheezing currently  Hypokalemia and hypomagnesemia -will replete potassium and Magnesium -follow electrolytes  Code Status: Full Family Communication: daughter at bedside  Disposition Plan: to be determined. Patient not eating, tired and essentially giving up according to family. Will like to pursuit palliative care only. Will involved palliative care service; continue antibiotics and supportive care. Diet advance to full liquid as tolerated. Weaned O2 supplementation if possible. SNF with palliative care Vs residential hospice if rapidly continue declining.   Consultants:  CCS  Palliative care  Procedures:  See below for x-ray reports   Antibiotics:  Vancomycin and Unasyn 12/13/16  HPI/Subjective: Afebrile, no CP, breathing stable. Patient w/o any further diarrhea and no further vomiting. Reports nausea and diffuse abd pain intermittently.  Objective: Vitals:   12/14/16 2230 12/15/16 0631  BP: (!) 149/47 (!) 140/52  Pulse: 86 83  Resp: 16 16  Temp: 99.5 F (37.5 C) 98.4 F (36.9 C)    Intake/Output Summary (Last 24 hours) at 12/15/16 1012 Last data filed at 12/15/16 0650  Gross per 24 hour  Intake             2250 ml  Output              750 ml  Net             1500 ml   Filed Weights   12/12/16 2031 12/13/16 0230  Weight:  82.1 kg (181 lb) 72.9 kg (160 lb 11.5 oz)    Exam:   General:  Slightly more alert. Denies  nausea and vomiting. But continue to have intermittent episodes of abdominal pain; expressed to be tired and per family not eating anything (not even trying). No CP. Breathing stable; using some oxygen for supplementation.. Afebrile.  Cardiovascular: S1 and s2, no rubs, no gallops; no JVD  Respiratory: scattered rhonchi; decrease BS at bases; no wheezing.  Abdomen: soft, slight guarding and diffuse tenderness to deep palpation; positive BS  Musculoskeletal: no edema, no cyanosis   Skin: stage 2 sacral ulcer; no drainage, no surrounded erythema   Data Reviewed: Basic Metabolic Panel:  Recent Labs Lab 12/08/16 1816 12/09/16 0416 12/12/16 2141 12/14/16 0554 12/15/16 0509  NA  --  140 140  --  142  K 3.3* 3.5 3.9  --  2.4*  CL  --  107 102  --  107  CO2  --  25 24  --  27  GLUCOSE  --  79 95  --  128*  BUN  --  11 16  --  15  CREATININE  --  0.42* 1.03* 0.69 1.03*  CALCIUM  --  8.3* 7.8*  --  8.2*  MG 1.6*  --   --   --   --    Liver Function Tests:  Recent Labs Lab 12/12/16 2141  AST 30  ALT 21  ALKPHOS 116  BILITOT 1.5*  PROT 7.1  ALBUMIN 2.5*    Recent Labs Lab 12/12/16 2141  LIPASE 22   CBC:  Recent Labs Lab 12/09/16 0416 12/12/16 2141 12/15/16 0509  WBC 11.3* 18.1* 23.3*  NEUTROABS  --  14.0*  --   HGB 7.6* 8.9* 8.0*  HCT 23.4* 27.2* 24.4*  MCV 83.9 82.4 84.7  PLT 358 381 308   BNP (last 3 results)  Recent Labs  11/20/16 0833  BNP 201.1*   CBG: No results for input(s): GLUCAP in the last 168 hours.  Recent Results (from the past 240 hour(s))  C difficile quick scan w PCR reflex     Status: None   Collection Time: 12/08/16  4:53 PM  Result Value Ref Range Status   C Diff antigen NEGATIVE NEGATIVE Final   C Diff toxin NEGATIVE NEGATIVE Final   C Diff interpretation No C. difficile detected.  Final  Blood Culture (routine x 2)     Status: None (Preliminary result)   Collection Time: 12/13/16 12:49 AM  Result Value Ref Range Status    Specimen Description BLOOD LEFT ARM  Final   Special Requests BOTTLES DRAWN AEROBIC AND ANAEROBIC 10CC  Final   Culture   Final    NO GROWTH 1 DAY Performed at [facility], [place] [address], [place], [place] [address]    Report Status PENDING  Incomplete  Blood Culture (routine x 2)     Status: None (Preliminary result)   Collection Time: 12/13/16 12:51 AM  Result Value Ref Range Status   Specimen Description BLOOD RIGHT ANTECUBITAL  Final   Special Requests BOTTLES DRAWN AEROBIC AND ANAEROBIC 10CC  Final   Culture   Final    NO GROWTH 1 DAY Performed at [facility], [place] [address] [place], [place] [address]    Report Status PENDING  Incomplete  MRSA PCR Screening     Status: None   Collection Time: 12/13/16  4:25 AM  Result Value Ref Range Status   MRSA  by PCR NEGATIVE NEGATIVE Final    Comment:        The GeneXpert MRSA Assay (FDA approved for NASAL specimens only), is one component of a comprehensive MRSA colonization surveillance program. It is not intended to diagnose MRSA infection nor to guide or monitor treatment for MRSA infections.      Studies: Dg Swallowing Func-speech Pathology  Result Date: 12/13/2016 Objective Swallowing Evaluation: Type of Study: MBS-Modified Barium Swallow Study Patient Details Name: ASHLINN HEMRICK MRN: [id number] Date of Birth: 1936-08-02 Today's Date: 12/13/2016 Time: SLP Start Time (ACUTE ONLY): 1330-SLP Stop Time (ACUTE ONLY): 1340 SLP Time Calculation (min) (ACUTE ONLY): 10 min Past Medical History: Past Medical History: Diagnosis Date . Anxiousness  . COPD (chronic obstructive pulmonary disease) ([place])  . Hyperlipidemia  . Hypertension  . Small bowel obstruction due to adhesions 11/17/2016 . Uterine fibroid  Past Surgical History: Past Surgical History: Procedure Laterality Date . BACK SURGERY    X2 . bone removed from skull Right 1957 . CHOLECYSTECTOMY N/A 10/30/2016  Procedure: LAPAROSCOPIC CHOLECYSTECTOMY WITH INTRAOPERATIVE  CHOLANGIOGRAM;  Surgeon: Johnathan Hausen, MD;  Location: [facility];  Service: General;  Laterality: N/A; . JOINT REPLACEMENT    knee . LAPAROTOMY N/A 11/17/2016  Procedure: EXPLORATORY LAPAROTOMY WITH LYSIS OF ADHESIONS;  Surgeon: Fanny Skates, MD;  Location: [facility];  Service: General;  Laterality: N/A; . LAPAROTOMY N/A 11/24/2016  Procedure: RE DO EXPLORATORY LAPAROTOMY;  Surgeon: Jackolyn Confer, MD;  Location: [facility];  Service: General;  Laterality: N/A; HPI: 81 y.o. female with medical history significant of hypertension, hyperlipidemia, COPD, GERD, depression, uterine fibroids, small bowel obstruction, who presents with shortness breath, nausea, vomiting, diarrhea and abdominal pain.   Subjective: pt awake in chair Assessment / Plan / Recommendation CHL IP CLINICAL IMPRESSIONS 12/13/2016 Clinical Impression Pt presents with minimal oral deficits resulting in delayed oral transiting and piecemealing.  Suspect this is due to pt's h/o nausea/vomiting as she confirms oral deficits not present prior to N/V/D issues.  Pharyngeal swallow was timely and strong without residuals nor aspiration/penetration.    Of note, pt admits to problems with vomiting after meals and states food "balls up in mouth".  Recommend diet as Md indicates. No SLP folllow up indicated.  Thanks for referral.  SLP Visit Diagnosis Dysphagia, oral phase (R13.11) Attention and concentration deficit following -- Frontal lobe and executive function deficit following -- Impact on safety and function Mild aspiration risk   No flowsheet data found.  No flowsheet data found. CHL IP DIET RECOMMENDATION 12/13/2016 SLP Diet Recommendations (No Data) Liquid Administration via Cup;Straw Medication Administration Whole meds with liquid Compensations Slow rate;Small sips/bites Postural Changes Remain semi-upright after after feeds/meals (Comment)   CHL IP OTHER RECOMMENDATIONS 12/13/2016 Recommended Consults -- Oral Care Recommendations Oral care BID Other Recommendations  --   CHL IP FOLLOW UP RECOMMENDATIONS 12/13/2016 Follow up Recommendations None        CHL IP ORAL PHASE 12/13/2016 Oral Phase -- Oral - Pudding Teaspoon -- Oral - Pudding Cup -- Oral - Honey Teaspoon -- Oral - Honey Cup -- Oral - Nectar Teaspoon -- Oral - Nectar Cup Delayed oral transit;Premature spillage Oral - Nectar Straw Delayed oral transit;Premature spillage Oral - Thin Teaspoon -- Oral - Thin Cup Delayed oral transit;Premature spillage Oral - Thin Straw Premature spillage;Delayed oral transit Oral - Puree Delayed oral transit;Premature spillage;Lingual pumping Oral - Mech Soft Delayed oral transit;Premature spillage;Lingual pumping Oral - Regular -- Oral - Multi-Consistency -- Oral - Pill -- Oral Phase -  Comment --  CHL IP PHARYNGEAL PHASE 12/13/2016 Pharyngeal Phase WFL Pharyngeal- Pudding Teaspoon -- Pharyngeal -- Pharyngeal- Pudding Cup -- Pharyngeal -- Pharyngeal- Honey Teaspoon -- Pharyngeal -- Pharyngeal- Honey Cup -- Pharyngeal -- Pharyngeal- Nectar Teaspoon -- Pharyngeal -- Pharyngeal- Nectar Cup WFL Pharyngeal -- Pharyngeal- Nectar Straw -- Pharyngeal -- Pharyngeal- Thin Teaspoon -- Pharyngeal -- Pharyngeal- Thin Cup WFL Pharyngeal -- Pharyngeal- Thin Straw WFL Pharyngeal -- Pharyngeal- Puree WFL Pharyngeal -- Pharyngeal- Mechanical Soft WFL Pharyngeal -- Pharyngeal- Regular -- Pharyngeal -- Pharyngeal- Multi-consistency -- Pharyngeal -- Pharyngeal- Pill -- Pharyngeal -- Pharyngeal Comment --  CHL IP CERVICAL ESOPHAGEAL PHASE 12/13/2016 Cervical Esophageal Phase WFL Pudding Teaspoon -- Pudding Cup -- Honey Teaspoon -- Honey Cup -- Nectar Teaspoon -- Nectar Cup -- Nectar Straw -- Thin Teaspoon -- Thin Cup -- Thin Straw -- Puree -- Mechanical Soft -- Regular -- Multi-consistency -- Pill -- Cervical Esophageal Comment -- No flowsheet data found. Luanna Salk, MS [facility] SLP [phone]               Scheduled Meds: . amLODipine  10 mg Oral Daily  . ampicillin-sulbactam (UNASYN) IV  3 g Intravenous Q6H  .  aspirin EC  81 mg Oral Daily  . carvedilol  12.5 mg Oral BID WC  . cloNIDine  0.1 mg Oral Daily  . docusate sodium  100 mg Oral BID  . enoxaparin (LOVENOX) injection  40 mg Subcutaneous Q24H  . hydrALAZINE  50 mg Oral QID  . losartan  50 mg Oral Daily  . magnesium sulfate 1 - 4 g bolus IVPB  2 g Intravenous Once  . mirtazapine  15 mg Oral QHS  . multivitamins with iron  1 tablet Oral Daily  . pantoprazole  40 mg Oral Daily  . polyethylene glycol  17 g Oral Daily  . potassium chloride  40 mEq Oral Q4H  . vancomycin  1,000 mg Intravenous Q24H   Continuous Infusions: . dextrose 5 % and 0.9 % NaCl with KCl 40 mEq/L      Principal Problem:   Aspiration pneumonia ([facility]) Active Problems:   Essential hypertension   Hyperlipidemia   COPD (chronic obstructive pulmonary disease) ([facility])   GERD (gastroesophageal reflux disease)   Nausea vomiting and diarrhea   Sepsis ([place])   Acute encephalopathy   Protein-calorie malnutrition, severe    Time spent: 25 minutes    Barton Dubois  [facility] Pager [phone]. If 7PM-7AM, please contact night-coverage at [email or web address], password [facility] 12/15/2016, 10:12 AM  LOS: 2 days

## 2016-12-16 DIAGNOSIS — E43 Unspecified severe protein-calorie malnutrition: Secondary | ICD-10-CM

## 2016-12-16 LAB — MAGNESIUM: Magnesium: 2 mg/dL (ref 1.7–2.4)

## 2016-12-16 LAB — BASIC METABOLIC PANEL
Anion gap: 6 (ref 5–15)
BUN: 23 mg/dL — AB (ref 6–20)
CHLORIDE: 112 mmol/L — AB (ref 101–111)
CO2: 26 mmol/L (ref 22–32)
Calcium: 8.2 mg/dL — ABNORMAL LOW (ref 8.9–10.3)
Creatinine, Ser: 1.43 mg/dL — ABNORMAL HIGH (ref 0.44–1.00)
GFR calc non Af Amer: 34 mL/min — ABNORMAL LOW (ref >60)
GFR, EST AFRICAN AMERICAN: 40 mL/min — AB (ref >60)
Glucose, Bld: 180 mg/dL — ABNORMAL HIGH (ref 65–99)
POTASSIUM: 4 mmol/L (ref 3.5–5.1)
SODIUM: 144 mmol/L (ref 135–145)

## 2016-12-16 MED ORDER — MORPHINE SULFATE (PF) 4 MG/ML IV SOLN
2.0000 mg | INTRAVENOUS | Status: DC | PRN
  Administered 2016-12-17 (×4): 2 mg via INTRAVENOUS
  Filled 2016-12-16 (×4): qty 1

## 2016-12-16 NOTE — Progress Notes (Signed)
[facility] PROGRESS NOTE  Melanie [name] MVH:[id number] DOB: November 17, 1935 DOA: 12/12/2016 PCP: Osborne Casco, MD  Interim summary and HPI 81 y.o. female with medical history significant of hypertension, hyperlipidemia, COPD, GERD, depression, uterine fibroids, small bowel obstruction, who presents with shortness breath, nausea, vomiting, diarrhea and abdominal pain. Pt was recently admitted to hospital 10/28/16-12/09/16. She underwent several complicated abdominal surgeries, including laproscopic cholecystectomy, SBO and lysis of adhesion and reduction of volvulus. She was discharged to rehabilitation facility. Per her daughter, patient pt has been having nausea, vomiting, abdominal pain and diarrhea in the past 4 days. Patient vomits each time when she eats food.        Patient found to have HCAP Vs aspiration PNA; admitted for further evaluation and treatment.  Planning to pursuit full comfort care and residential hospice. Will start deescalating therapy and focus on comfort measures.  Assessment/Plan: Aspiration pneumonia vs HCAP and sepsis: Patient has a shortness breath and CT scan of abdomen/pelvis showed right lower lobe consolidation, indicating possible pneumonia, aspiration versus HCAP.  -Pt met criteria for sepsis with leukocytosis, hypothermia, heart rate >90 on admission and acute encephalopathy. CXR demonstrating PNA. -will stop antibiotics -SLP w/o significant risk for aspiration (which means that if patient aspirated; was during vomiting events) -continue neb treatment as needed  -focus on full comfort care as per [place] meeting results  Acute encephalopathy: Likely secondary to pneumonia and sepsis -Treat underlying issues -follow clinical response and continue supportive care -patient with better insight overall -sleepy from pain meds now most likely -planning full comfort care.  Nausea vomiting, diarrhea and abdominal pain: Etiology is not clear. CT abdomen/pelvis  did not show acute issues.  -Lipase normal.  -Her abdominal pain might be related to recent complicated abdominal surgery; seen by CCS, appreciate opinion and recommendations. Essentially not evidence of acute surgical complication. -no further diarrhea now for 48 hours; C. Diff unable to be collected (time out). Will monitor  -per discussion with GOC, will transition to comfort care -comfort feeding, PRN antiemetics and pain meds  HTN: -continue amlodipine, Coreg, clonidine and hydralazine -Cozaar and diuretics discontinue -Given worsening renal function  GERD: -continue Protonix for now  COPD (chronic obstructive pulmonary disease) ([place]):  -continue PRN albuterol nebulizer treatment -no wheezing currently -continue oxygen for comfort  Hypokalemia and hypomagnesemia -potassium and MG repleted -now planning comfort care only -no further blood draws anticipated.  Code Status: Full Family Communication: daughter at bedside  Disposition Plan: patient not eating at all, rapidly declining and now with worsening renal function and decrease urine output. Per [place] meeting discussion will focus on comfort care only and pursuit residential hospice.   Consultants:  CCS  Palliative care  Procedures:  See below for x-ray reports   Antibiotics:  Vancomycin and Unasyn 12/13/16>>>12/16/16  HPI/Subjective: Afebrile, no CP, breathing stable. Patient w/o any further diarrhea and no further vomiting. Reports nausea and diffuse abd pain intermittently.  Objective: Vitals:   12/15/16 2125 12/16/16 0559  BP: (!) 127/59 (!) 109/48  Pulse: 88 83  Resp: 20 18  Temp: 99 F (37.2 C) 99 F (37.2 C)    Intake/Output Summary (Last 24 hours) at 12/16/16 1314 Last data filed at 12/16/16 0600  Gross per 24 hour  Intake          2470.42 ml  Output              175 ml  Net          2295.42 ml  Filed Weights   12/12/16 2031 12/13/16 0230  Weight: 82.1 kg (181 lb) 72.9 kg (160 lb 11.5  oz)    Exam:   General:  Sleepy again today; moaning and complaining of generalized abd discomfort. Continue to have very poor appetite. Worsening renal function seen on blood work and decrease on urine output. No CP. Breathing stable; using some oxygen for supplementation. Afebrile.  Cardiovascular: S1 and s2, no rubs, no gallops; no JVD  Respiratory: scattered rhonchi; decrease BS at bases; no wheezing.  Abdomen: soft, slight guarding and diffuse tenderness to deep palpation; positive BS  Musculoskeletal: no edema, no cyanosis   Skin: stage 2 sacral ulcer; no drainage, no surrounded erythema   Data Reviewed: Basic Metabolic Panel:  Recent Labs Lab 12/12/16 2141 12/14/16 0554 12/15/16 0509 12/16/16 0554  NA 140  --  142 144  K 3.9  --  2.4* 4.0  CL 102  --  107 112*  CO2 24  --  27 26  GLUCOSE 95  --  128* 180*  BUN 16  --  15 23*  CREATININE 1.03* 0.69 1.03* 1.43*  CALCIUM 7.8*  --  8.2* 8.2*  MG  --   --   --  2.0   Liver Function Tests:  Recent Labs Lab 12/12/16 2141  AST 30  ALT 21  ALKPHOS 116  BILITOT 1.5*  PROT 7.1  ALBUMIN 2.5*    Recent Labs Lab 12/12/16 2141  LIPASE 22   CBC:  Recent Labs Lab 12/12/16 2141 12/15/16 0509  WBC 18.1* 23.3*  NEUTROABS 14.0*  --   HGB 8.9* 8.0*  HCT 27.2* 24.4*  MCV 82.4 84.7  PLT 381 308   BNP (last 3 results)  Recent Labs  11/20/16 0833  BNP 201.1*   CBG: No results for input(s): GLUCAP in the last 168 hours.  Recent Results (from the past 240 hour(s))  C difficile quick scan w PCR reflex     Status: None   Collection Time: 12/08/16  4:53 PM  Result Value Ref Range Status   C Diff antigen NEGATIVE NEGATIVE Final   C Diff toxin NEGATIVE NEGATIVE Final   C Diff interpretation No C. difficile detected.  Final  Blood Culture (routine x 2)     Status: None (Preliminary result)   Collection Time: 12/13/16 12:49 AM  Result Value Ref Range Status   Specimen Description BLOOD LEFT ARM  Final    Special Requests BOTTLES DRAWN AEROBIC AND ANAEROBIC 10CC  Final   Culture   Final    NO GROWTH 3 DAYS Performed at [facility], [place] [address] [place], [place] [address]    Report Status PENDING  Incomplete  Blood Culture (routine x 2)     Status: None (Preliminary result)   Collection Time: 12/13/16 12:51 AM  Result Value Ref Range Status   Specimen Description BLOOD RIGHT ANTECUBITAL  Final   Special Requests BOTTLES DRAWN AEROBIC AND ANAEROBIC 10CC  Final   Culture   Final    NO GROWTH 3 DAYS Performed at [facility], [place] [address] [place], [place] [address]    Report Status PENDING  Incomplete  MRSA PCR Screening     Status: None   Collection Time: 12/13/16  4:25 AM  Result Value Ref Range Status   MRSA by PCR NEGATIVE NEGATIVE Final    Comment:        The GeneXpert MRSA Assay (FDA approved for NASAL specimens only), is one  component of a comprehensive MRSA colonization surveillance program. It is not intended to diagnose MRSA infection nor to guide or monitor treatment for MRSA infections.      Studies: No results found.  Scheduled Meds: . amLODipine  10 mg Oral Daily  . carvedilol  12.5 mg Oral BID WC  . cloNIDine  0.1 mg Oral Daily  . docusate sodium  100 mg Oral BID  . feeding supplement (PRO-STAT SUGAR FREE 64)  30 mL Oral BID  . hydrALAZINE  50 mg Oral QID  . mirtazapine  15 mg Oral QHS  . pantoprazole  40 mg Oral Daily  . polyethylene glycol  17 g Oral Daily  . protein supplement  8 oz Oral TID   Continuous Infusions: . dextrose 5 % and 0.9 % NaCl with KCl 40 mEq/L 100 mL/hr at 12/16/16 0222    Principal Problem:   Aspiration pneumonia ([place]) Active Problems:   Essential hypertension   Hyperlipidemia   COPD (chronic obstructive pulmonary disease) (H[facility])   GERD (gastroesophageal reflux disease)   Nausea vomiting and diarrhea   Sepsis ([place])   Acute encephalopathy   Protein-calorie malnutrition, severe   Encounter for palliative  care   Goals of care, counseling/discussion    Time spent: 25 minutes    Barton Dubois  [facility] Pager [phone]. If 7PM-7AM, please contact night-coverage at [email or web address], password [facility] 12/16/2016, 1:14 PM  LOS: 3 days

## 2016-12-16 NOTE — Progress Notes (Signed)
[facility] Liaison RN Visit for [place] request from [place] for family interest in [facility] with request for transfer today for [facility]. Bed not available today but can offer bed tomorrow. Chart reviewed. Met with patient to confirm interest. Many family members at bedside and were thankful that [facility] would have a bed available tomorrow and states that daughter Hilda Blades would be agreeable to transfer tomorrow. CSW aware. Registration paperwork to be completed tomorrow at 9 am if daughter Hilda Blades is agreeable. Family attempted to reach [place] by phone without success. I have attempted to call as well and will continue to attempt to arrange registration visit for 12/17/16 at 9 am.  Thank you, Margaretmary Eddy, [place] [phone]  [place] now on AMION. Please feel free to call me at the above number or call [phone] after hours.

## 2016-12-16 NOTE — Progress Notes (Signed)
Daily Progress Note   Patient Name: Melanie [name]       Date: 12/16/2016 DOB: Mar 16, 1936  Age: 81 y.o. MRN#: [id number] Attending Physician: Barton Dubois, MD Primary Care Physician: Osborne Casco, MD Admit Date: 12/12/2016  Reason for Consultation/Follow-up: Establishing goals of care  Subjective:  opens eyes when her name is called [place], moaning due to abdominal pain    Length of Stay: 3  Current Medications: Scheduled Meds:  . amLODipine  10 mg Oral Daily  . ampicillin-sulbactam (UNASYN) IV  3 g Intravenous Q6H  . aspirin EC  81 mg Oral Daily  . carvedilol  12.5 mg Oral BID WC  . cloNIDine  0.1 mg Oral Daily  . docusate sodium  100 mg Oral BID  . enoxaparin (LOVENOX) injection  40 mg Subcutaneous Q24H  . feeding supplement (PRO-STAT SUGAR FREE 64)  30 mL Oral BID  . hydrALAZINE  50 mg Oral QID  . losartan  50 mg Oral Daily  . mirtazapine  15 mg Oral QHS  . multivitamins with iron  1 tablet Oral Daily  . pantoprazole  40 mg Oral Daily  . polyethylene glycol  17 g Oral Daily  . protein supplement  8 oz Oral TID  . vancomycin  1,000 mg Intravenous Q24H    Continuous Infusions: . dextrose 5 % and 0.9 % NaCl with KCl 40 mEq/L 100 mL/hr at 12/16/16 0222    PRN Meds: acetaminophen, albuterol, bisacodyl, hydrALAZINE, hyoscyamine, morphine injection, ondansetron, oxyCODONE  Physical Exam         Frail weak +abdominal pain Not eating s1 s2 No edema Shallow clear  Vital Signs: BP (!) 109/48 (BP Location: Left Arm)   Pulse 83   Temp 99 F (37.2 C) (Oral)   Resp 18   Ht 5\' 2"  (1.575 m)   Wt 72.9 kg (160 lb 11.5 oz)   SpO2 100%   BMI 29.40 kg/m  SpO2: SpO2: 100 % O2 Device: O2 Device: Nasal Cannula O2 Flow Rate: O2 Flow Rate (L/min): 2  L/min  Intake/output summary:   Intake/Output Summary (Last 24 hours) at 12/16/16 1113 Last data filed at 12/16/16 0600  Gross per 24 hour  Intake          2530.42 ml  Output  175 ml  Net          2355.42 ml   LBM: Last BM Date: 12/12/16 Baseline Weight: Weight: 82.1 kg (181 lb) Most recent weight: Weight: 72.9 kg (160 lb 11.5 oz)       Palliative Assessment/Data:    Flowsheet Rows   Flowsheet Row Most Recent Value  Intake Tab  Referral Department  Hospitalist  Unit at Time of Referral  Med/Surg Unit  Palliative Care Primary Diagnosis  Other (Comment)  Date Notified  12/15/16  Palliative Care Type  Return patient Palliative Care  Reason for referral  Clarify Goals of Care, End of Life Care Assistance  Date of Admission  12/12/16  Date first seen by Palliative Care  12/15/16  # of days Palliative referral response time  0 Day(s)  # of days IP prior to Palliative referral  3  Clinical Assessment  Palliative Performance Scale Score  20%  Pain Max last 24 hours  6  Pain Min Last 24 hours  5  Dyspnea Max Last 24 Hours  4  Dyspnea Min Last 24 hours  3  Psychosocial & Spiritual Assessment  Palliative Care Outcomes  Patient/Family meeting held?  Yes  Who was at the meeting?  HCPOA daughter [name]   Palliative Care Outcomes  Clarified goals of care      Patient Active Problem List   Diagnosis Date Noted  . Encounter for palliative care   . Goals of care, counseling/discussion   . Aspiration pneumonia ([place]) 12/13/2016  . Nausea vomiting and diarrhea 12/13/2016  . Sepsis ([place]) 12/13/2016  . Acute encephalopathy 12/13/2016  . Protein-calorie malnutrition, severe 12/13/2016  . Diarrhea   . Healthcare-associated pneumonia   . Pressure injury of skin 11/28/2016  . Anxiousness   . H/O exploratory laparotomy 11/24/2016  . Chest pain   . Small bowel obstruction s/p ex lap & lysis of adhesions 11/17/2016 11/17/2016  . Necrotic cholecystitis s/p lap  cholecystectomy 10/30/2016 11/09/2016  . GERD (gastroesophageal reflux disease) 11/09/2016  . Essential hypertension   . Hyperlipidemia   . COPD (chronic obstructive pulmonary disease) ([place])   . Generalized abdominal pain 10/28/2016    Palliative Care Assessment & Plan   Patient Profile:    Assessment:  81 y.o.femalewith medical history significant of hypertension, hyperlipidemia, COPD, GERD, depression, uterine fibroids, small bowel obstruction, who presents with shortness breath, nausea, vomiting, diarrhea and abdominal pain. Pt was recently admitted tohospital 10/28/16-12/09/16. She underwent several complicated abdominal surgeries, including laproscopic cholecystectomy, SBO andlysis of adhesion and reduction of volvulus. She was discharged to rehabilitation facility. Per her daughter, patient pt has been having nausea, vomiting, abdominal pain and diarrhea in the past 4 days. Patient vomitseach time when she eats food.  Patient found to have HCAP Vs aspiration PNA; admitted for further evaluation and treatment.  Palliative consulted due to ongoing decline, lack of eating, worsening pain, worsening renal function.   Recommendations/Plan:  Adjust Morphine IV PRN  Long talk with daughter Nadara Eaton agent about the patient's current condition. Discussed again about hospice services in detail.   Will place CSW consult for residential hospice   Goals of Care and Additional Recommendations:  Limitations on Scope of Treatment: Full Comfort Care  Code Status:    Code Status Orders        Start     Ordered   12/15/16 1435  Do not attempt resuscitation (DNR)  Continuous    Question Answer Comment  In the event of cardiac or respiratory  ARREST Do not call a "code blue"   In the event of cardiac or respiratory ARREST Do not perform Intubation, CPR, defibrillation or ACLS   In the event of cardiac or respiratory ARREST Use medication by any route, position, wound care, and  other measures to relive pain and suffering. May use oxygen, suction and manual treatment of airway obstruction as needed for comfort.      12/15/16 1434    Code Status History    Date Active Date Inactive Code Status Order ID Comments User Context   12/13/2016 12:46 AM 12/15/2016  2:34 PM Full Code [id number]  Ivor Costa, MD ED   11/18/2016  7:58 AM 12/09/2016  6:56 PM Full Code [id number]  Fanny Skates, MD Inpatient   10/28/2016  4:28 PM 11/18/2016  7:58 AM Full Code [id number]  Mauricio Gerome Apley, MD Inpatient    Advance Directive Documentation   Flowsheet Row Most Recent Value  Type of Advance Directive  Healthcare Power of Attorney  Pre-existing out of facility DNR order (yellow form or pink MOST form)  No data  "MOST" Form in Place?  No data       Prognosis:   < 2 weeks  Discharge Planning:  Hospice facility  Care plan was discussed with  Daughter, Dr Dyann Kief, [place]   Thank you for allowing the Palliative Medicine Team to assist in the care of this patient.   Time In:  10.45 Time Out: 11.10 Total Time 25 Prolonged Time Billed  no       Greater than 50%  of this time was spent counseling and coordinating care related to the above assessment and plan.  Loistine Chance, MD [phone]  Please contact Palliative Medicine Team phone at [phone] for questions and concerns.

## 2016-12-16 NOTE — Progress Notes (Signed)
CSW received consult for residential hospice placement. CSW met with patient's daughter, Melanie [name] at bedside to discuss residential hospice, daughter expressed interest in [facility]. CSW made referral to [place], Olivia Mackie - awaiting response re: bed availability/eligibility. CSW discussed with patient's daughter backup plan - daughter states the possibility they could take her home with hospice until a bed opens at [facility] or going to [place]. CSW will await response re: bed availability at [facility].    Raynaldo Opitz, [facility] Clinical Social Worker cell #: [phone]

## 2016-12-16 NOTE — Progress Notes (Signed)
  Subjective: Still complains of abd pain  Objective: Vital signs in last 24 hours: Temp:  [98.8 F (37.1 C)-99 F (37.2 C)] 99 F (37.2 C) (03/10 0559) Pulse Rate:  [83-88] 83 (03/10 0559) Resp:  [16-20] 18 (03/10 0559) BP: (109-127)/(48-59) 109/48 (03/10 0559) SpO2:  [98 %-100 %] 100 % (03/10 0559) Last BM Date: 12/12/16  Intake/Output from previous day: 03/09 0701 - 03/10 0700 In: 2530.4 [P.O.:120; I.V.:2210.4; IV Piggyback:200] Out: 175 [Urine:175] Intake/Output this shift: No intake/output data recorded.  Resp: clear to auscultation bilaterally Cardio: regular rate and rhythm GI: soft, moderate diffuse tenderness  Lab Results:   Recent Labs  12/15/16 0509  WBC 23.3*  HGB 8.0*  HCT 24.4*  PLT 308   BMET  Recent Labs  12/15/16 0509 12/16/16 0554  NA 142 144  K 2.4* 4.0  CL 107 112*  CO2 27 26  GLUCOSE 128* 180*  BUN 15 23*  CREATININE 1.03* 1.43*  CALCIUM 8.2* 8.2*   PT/INR No results for input(s): LABPROT, INR in the last 72 hours. ABG No results for input(s): PHART, HCO3 in the last 72 hours.  Invalid input(s): PCO2, PO2  Studies/Results: No results found.  Anti-infectives: Anti-infectives    Start     Dose/Rate Route Frequency Ordered Stop   12/13/16 2200  vancomycin (VANCOCIN) IVPB 1000 mg/200 mL premix     1,000 mg 200 mL/hr over 60 Minutes Intravenous Every 24 hours 12/13/16 1241     12/13/16 1800  Ampicillin-Sulbactam (UNASYN) 3 g in sodium chloride 0.9 % 100 mL IVPB     3 g 200 mL/hr over 30 Minutes Intravenous Every 6 hours 12/13/16 1232     12/13/16 1200  vancomycin (VANCOCIN) IVPB 750 mg/150 ml premix  Status:  Discontinued     750 mg 150 mL/hr over 60 Minutes Intravenous Every 12 hours 12/13/16 0200 12/13/16 1241   12/13/16 0200  azithromycin (ZITHROMAX) 500 mg in dextrose 5 % 250 mL IVPB  Status:  Discontinued     500 mg 250 mL/hr over 60 Minutes Intravenous Every 24 hours 12/13/16 0046 12/13/16 1159   12/13/16 0100   piperacillin-tazobactam (ZOSYN) IVPB 3.375 g  Status:  Discontinued     3.375 g 12.5 mL/hr over 240 Minutes Intravenous Every 8 hours 12/13/16 0043 12/13/16 1200   12/13/16 0030  piperacillin-tazobactam (ZOSYN) IVPB 4.5 g  Status:  Discontinued     4.5 g 200 mL/hr over 30 Minutes Intravenous  Once 12/13/16 0026 12/13/16 0027   12/13/16 0030  piperacillin-tazobactam (ZOSYN) IVPB 3.375 g  Status:  Discontinued     3.375 g 12.5 mL/hr over 240 Minutes Intravenous  Once 12/13/16 0027 12/13/16 0740   12/13/16 0015  ceFEPIme (MAXIPIME) 2 g in dextrose 5 % 50 mL IVPB  Status:  Discontinued     2 g 100 mL/hr over 30 Minutes Intravenous  Once 12/13/16 0008 12/13/16 0026   12/13/16 0015  vancomycin (VANCOCIN) IVPB 1000 mg/200 mL premix     1,000 mg 200 mL/hr over 60 Minutes Intravenous  Once 12/13/16 0008 12/13/16 0202      Assessment/Plan: s/p * No surgery found * Advance diet. Continue to offer po's since she had no sign of obstruction Continue abx for possible pneumonia No new recs  LOS: 3 days    TOTH III,Charidy Cappelletti S 12/16/2016

## 2016-12-17 LAB — CREATININE, SERUM
CREATININE: 1.62 mg/dL — AB (ref 0.44–1.00)
GFR calc Af Amer: 34 mL/min — ABNORMAL LOW (ref >60)
GFR calc non Af Amer: 29 mL/min — ABNORMAL LOW (ref >60)

## 2016-12-17 MED ORDER — HYDRALAZINE HCL 50 MG PO TABS: 50.0000 mg | ORAL_TABLET | Freq: Three times a day (TID) | ORAL | Status: AC

## 2016-12-17 MED ORDER — ALBUTEROL SULFATE (2.5 MG/3ML) 0.083% IN NEBU: 2.5000 mg | INHALATION_SOLUTION | RESPIRATORY_TRACT | Status: AC | PRN

## 2016-12-17 MED ORDER — POLYETHYLENE GLYCOL 3350 17 G PO PACK: 17.0000 g | PACK | Freq: Every day | ORAL | 0 refills | Status: AC

## 2016-12-17 MED ORDER — MORPHINE SULFATE (PF) 4 MG/ML IV SOLN: 2.0000 mg | INTRAVENOUS | Status: AC | PRN

## 2016-12-17 MED ORDER — DOCUSATE SODIUM 100 MG PO CAPS: 100.0000 mg | ORAL_CAPSULE | Freq: Two times a day (BID) | ORAL | 0 refills | Status: AC

## 2016-12-17 MED ORDER — BISACODYL 5 MG PO TBEC: 10.0000 mg | DELAYED_RELEASE_TABLET | Freq: Every day | ORAL | 0 refills | Status: AC | PRN

## 2016-12-17 NOTE — Progress Notes (Signed)
  Subjective: Still having abd pain. Complains of much looser bm's and more frequent  Objective: Vital signs in last 24 hours: Temp:  [98 F (36.7 C)-98.8 F (37.1 C)] 98 F (36.7 C) (03/11 0547) Pulse Rate:  [76] 76 (03/11 0547) Resp:  [18-20] 20 (03/11 0547) BP: (108-118)/(55-61) 108/61 (03/11 0547) SpO2:  [98 %-100 %] 100 % (03/11 0547) Last BM Date: 12/16/16  Intake/Output from previous day: 03/10 0701 - 03/11 0700 In: 2400 [I.V.:2400] Out: -  Intake/Output this shift: No intake/output data recorded.  Resp: clear to auscultation bilaterally Cardio: regular rate and rhythm GI: diffuse tenderness unchanged. good bs  Lab Results:   Recent Labs  12/15/16 0509  WBC 23.3*  HGB 8.0*  HCT 24.4*  PLT 308   BMET  Recent Labs  12/15/16 0509 12/16/16 0554 12/17/16 0513  NA 142 144  --   K 2.4* 4.0  --   CL 107 112*  --   CO2 27 26  --   GLUCOSE 128* 180*  --   BUN 15 23*  --   CREATININE 1.03* 1.43* 1.62*  CALCIUM 8.2* 8.2*  --    PT/INR No results for input(s): LABPROT, INR in the last 72 hours. ABG No results for input(s): PHART, HCO3 in the last 72 hours.  Invalid input(s): PCO2, PO2  Studies/Results: No results found.  Anti-infectives: Anti-infectives    Start     Dose/Rate Route Frequency Ordered Stop   12/13/16 2200  vancomycin (VANCOCIN) IVPB 1000 mg/200 mL premix  Status:  Discontinued     1,000 mg 200 mL/hr over 60 Minutes Intravenous Every 24 hours 12/13/16 1241 12/16/16 1312   12/13/16 1800  Ampicillin-Sulbactam (UNASYN) 3 g in sodium chloride 0.9 % 100 mL IVPB  Status:  Discontinued     3 g 200 mL/hr over 30 Minutes Intravenous Every 6 hours 12/13/16 1232 12/16/16 1312   12/13/16 1200  vancomycin (VANCOCIN) IVPB 750 mg/150 ml premix  Status:  Discontinued     750 mg 150 mL/hr over 60 Minutes Intravenous Every 12 hours 12/13/16 0200 12/13/16 1241   12/13/16 0200  azithromycin (ZITHROMAX) 500 mg in dextrose 5 % 250 mL IVPB  Status:   Discontinued     500 mg 250 mL/hr over 60 Minutes Intravenous Every 24 hours 12/13/16 0046 12/13/16 1159   12/13/16 0100  piperacillin-tazobactam (ZOSYN) IVPB 3.375 g  Status:  Discontinued     3.375 g 12.5 mL/hr over 240 Minutes Intravenous Every 8 hours 12/13/16 0043 12/13/16 1200   12/13/16 0030  piperacillin-tazobactam (ZOSYN) IVPB 4.5 g  Status:  Discontinued     4.5 g 200 mL/hr over 30 Minutes Intravenous  Once 12/13/16 0026 12/13/16 0027   12/13/16 0030  piperacillin-tazobactam (ZOSYN) IVPB 3.375 g  Status:  Discontinued     3.375 g 12.5 mL/hr over 240 Minutes Intravenous  Once 12/13/16 0027 12/13/16 0740   12/13/16 0015  ceFEPIme (MAXIPIME) 2 g in dextrose 5 % 50 mL IVPB  Status:  Discontinued     2 g 100 mL/hr over 30 Minutes Intravenous  Once 12/13/16 0008 12/13/16 0026   12/13/16 0015  vancomycin (VANCOCIN) IVPB 1000 mg/200 mL premix     1,000 mg 200 mL/hr over 60 Minutes Intravenous  Once 12/13/16 0008 12/13/16 0202      Assessment/Plan: s/p * No surgery found * Encourage po's  Consider rechecking c diff  LOS: 4 days    TOTH III,PAUL S 12/17/2016

## 2016-12-17 NOTE — Clinical Social Work Note (Signed)
CSW received phone call from [facility], who said they have a bed available for patient today.  CSW notified physician awaiting for discharge summary.  Ruta Broom. Luca Dyar, MSW, [place]  12/17/2016 9:53 AM

## 2016-12-17 NOTE — Discharge Summary (Signed)
Physician Discharge Summary  CHIQUETTA LANGNER [name]:[id number] DOB: [birth year] DOA: 12/12/2016  PCP: Osborne Casco, MD  Admit date: 12/12/2016 Discharge date: 12/17/2016  Time spent: 35 minutes  Recommendations for Outpatient Follow-up:  1. Full comfort care   Discharge Diagnoses:  Principal Problem:   Aspiration pneumonia ([place]) Active Problems:   Essential hypertension   Hyperlipidemia   COPD (chronic obstructive pulmonary disease) (HCC)   GERD (gastroesophageal reflux disease)   Nausea vomiting and diarrhea   Sepsis ([place])   Acute encephalopathy   Protein-calorie malnutrition, severe   Encounter for palliative care   Goals of care, counseling/discussion   Discharge Condition: Planning to pursuit full comfort care and residential hospice placement. Patient discharge to [facility].  Diet recommendation: comfort feeding.  Filed Weights   12/12/16 2031 12/13/16 0230  Weight: 82.1 kg (181 lb) 72.9 kg (160 lb 11.5 oz)    History of present illness:  81 y.o.femalewith medical history significant of hypertension, hyperlipidemia, COPD, GERD, depression, uterine fibroids, small bowel obstruction, who presents with shortness breath, nausea, vomiting, diarrhea and abdominal pain. Pt was recently admitted tohospital 10/28/16-12/09/16. She underwent several complicated abdominal surgeries, including laproscopic cholecystectomy, SBO andlysis of adhesion and reduction of volvulus. She was discharged to rehabilitation facility. Per her daughter, patient pt has been having nausea, vomiting, abdominal pain and diarrhea in the past 4 days. Patient vomitseach time when she eats food.        Patient found to have HCAP Vs aspiration PNA; admitted for further evaluation and treatment.   Hospital Course:  Aspiration pneumonia vs HCAP and sepsis:Patient has a shortness breath and CT scan of abdomen/pelvis showed right lower lobe consolidation, indicating possible pneumonia, aspiration versus  HCAP.  -Pt met criteria for sepsis on admission with leukocytosis, hypothermia, heart rate >95 and acute encephalopathy. CXR demonstrating PNA. -will stop antibiotics at this point -SLP w/o significant risk for aspiration (which means that if patient aspirated; was during vomiting events) -continue neb treatment as needed  -focus on full comfort care as per [place] meeting results -patient transfer to [facility]  Acute encephalopathy:Likely secondary to pneumonia and sepsis -received treatment for underlying issues -patient with better insight overall -sleepy from pain meds now most likely -planning full comfort care and symptomatic management only.  Nausea vomiting,diarrhea and abdominal pain:Etiology is not clear. CT abdomen/pelvis did not show acute issues.  -Lipase normal.  -Her abdominal pain might be related to recent complicated abdominal surgery; seen by [facility], appreciate opinion and recommendations. Essentially not evidence of acute surgical complication. -no further diarrhea now for 48 hours; C. Diff unable to be collected (time out). Will monitor  -per discussion with GOC, will transition to comfort care -comfort feeding, PRN antiemetics and pain meds  HTN: -continue amlodipine, Coreg, clonidine and hydralazine -Cozaar and diuretics discontinue -Given worsening renal function  GERD: -continue Protonix for now  COPD (chronic obstructive pulmonary disease) ([place]):  -continue PRN albuterol nebulizer treatment -no wheezing currently -continue oxygen for comfort  Hypokalemia and hypomagnesemia -potassium and MG repleted -now planning comfort care only -no further blood draws anticipated.  Procedures:  See below for x-ray reports   Consultations:  Palliative care  CCS  Discharge Exam: Vitals:   12/16/16 2109 12/17/16 0547  BP: (!) 108/55 108/61  Pulse: 76 76  Resp: 18 20  Temp: 98.8 F (37.1 C) 98 F (36.7 C)    General:  Sleepy again today;  moaning and complaining of intermittent generalized abd discomfort. Continue to have very poor appetite,  to not eating at all. With worsening renal function seen on blood work and decrease on urine output. No CP. Afebrile. Having loose stools with use of laxatives.  Cardiovascular: S1 and s2, no rubs, no gallops; no JVD  Respiratory: scattered rhonchi; decrease BS at bases; no wheezing.  Abdomen: soft, slight guarding and diffuse tenderness to deep palpation; positive BS  Musculoskeletal: no edema, no cyanosis   Skin: stage 2 sacral ulcer; no drainage, no surrounded erythema    Discharge Instructions   Discharge Instructions    Discharge instructions    Complete by:  As directed    Full comfort care Comfort feeding     Current Discharge Medication List    START taking these medications   Details  albuterol (PROVENTIL) (2.5 MG/3ML) 0.083% nebulizer solution Take 3 mLs (2.5 mg total) by nebulization every 4 (four) hours as needed for wheezing or shortness of breath.    bisacodyl (DULCOLAX) 5 MG EC tablet Take 2 tablets (10 mg total) by mouth daily as needed for mild constipation or moderate constipation. Qty: 30 tablet, Refills: 0    docusate sodium (COLACE) 100 MG capsule Take 1 capsule (100 mg total) by mouth 2 (two) times daily. Qty: 10 capsule, Refills: 0    morphine 4 MG/ML injection Inject 0.5 mLs (2 mg total) into the vein every 2 (two) hours as needed for severe pain.    polyethylene glycol (MIRALAX / GLYCOLAX) packet Take 17 g by mouth daily. Qty: 14 each, Refills: 0      CONTINUE these medications which have CHANGED   Details  hydrALAZINE (APRESOLINE) 50 MG tablet Take 1 tablet (50 mg total) by mouth 3 (three) times daily.      CONTINUE these medications which have NOT CHANGED   Details  acetaminophen (TYLENOL) 325 MG tablet Take 650 mg by mouth every 6 (six) hours as needed.    carvedilol (COREG) 12.5 MG tablet Take 1 tablet (12.5 mg total) by mouth 2 (two)  times daily with a meal. Qty: 60 tablet, Refills: 0    cloNIDine (CATAPRES) 0.1 MG tablet Take 0.1 mg by mouth daily.     mirtazapine (REMERON SOL-TAB) 15 MG disintegrating tablet Take 15 mg by mouth at bedtime.    omeprazole (PRILOSEC) 40 MG capsule Take 40 mg by mouth 2 (two) times daily.     oxyCODONE (OXY IR/ROXICODONE) 5 MG immediate release tablet Take 5-10 mg by mouth every 4 (four) hours as needed for severe pain.      STOP taking these medications     amLODipine (NORVASC) 10 MG tablet      aspirin EC 81 MG tablet      chlorthalidone (HYGROTON) 25 MG tablet      losartan (COZAAR) 50 MG tablet      Multiple Vitamins-Iron (MULTIVITAMINS WITH IRON) TABS tablet      promethazine (PHENERGAN) 25 MG suppository      promethazine (PHENERGAN) 25 MG/ML injection        Allergies  Allergen Reactions  . Nsaids     hypertension  . Ace Inhibitors Other (See Comments)    cough    The results of significant diagnostics from this hospitalization (including imaging, microbiology, ancillary and laboratory) are listed below for reference.    Significant Diagnostic Studies: Dg Chest 1 View  Result Date: 12/08/2016 CLINICAL DATA:  Dyspnea, left side chest pain EXAM: CHEST 1 VIEW COMPARISON:  12/02/2016 FINDINGS: Cardiomegaly again noted. Stable right PICC line position with tip in distal  SVC. No pulmonary edema. Emphysematous changes bilaterally again noted. Small bilateral pleural effusion with bilateral basilar atelectasis. IMPRESSION: Stable right PICC line position with tip in distal SVC. No pulmonary edema. Emphysematous changes bilaterally again noted. Small bilateral pleural effusion with bilateral basilar atelectasis. Electronically Signed   By: Lahoma Crocker M.D.   On: 12/08/2016 09:32   Dg Chest 2 View  Result Date: 12/12/2016 CLINICAL DATA:  Nausea vomiting and abdominal pain EXAM: CHEST  2 VIEW COMPARISON:  12/08/2016 FINDINGS: Right upper extremity catheter has been removed.  There are tiny bilateral effusions. No focal consolidation. Stable mild cardiomegaly. No pneumothorax. Partially visualized hardware within the spine. IMPRESSION: 1. Removal of right upper extremity catheter 2. Small bilateral effusions. Improved aeration at the bilateral lung bases 3. Mild cardiomegaly Electronically Signed   By: Donavan Foil M.D.   On: 12/12/2016 22:32   Dg Abd 1 View  Result Date: 12/06/2016 CLINICAL DATA:  Mid abdominal pain. Recent recurrent small bowel obstruction, with surgery on 11/24/2016. EXAM: ABDOMEN - 1 VIEW COMPARISON:  11/27/2016 FINDINGS: There are no dilated loops of large or small bowel. No visible free air or free fluid on these supine radiographs. Surgical staples are seen in the midline. No acute bone abnormality. NG tube has been removed. IMPRESSION: No residual dilated small bowel loops.  Benign-appearing abdomen. Electronically Signed   By: Lorriane Shire M.D.   On: 12/06/2016 11:02   Dg Abd 1 View  Result Date: 11/27/2016 CLINICAL DATA:  81 year old female with abdominal pain EXAM: ABDOMEN - 1 VIEW COMPARISON:  abdominal radiograph dated 11/24/2016 FINDINGS: Partially visualized enteric tube in similar position as the prior radiograph and likely in the distal stomach. There is persistent mildly distended air-filled loop of small bowel in the left lower quadrant measuring up to 3.6 cm in diameter similar to the prior radiograph. Air is noted within the colon. No free air identified on the provided image. Right upper quadrant cholecystectomy clips noted. There is degenerative changes of the spine with lower lumbar laminectomy and fusion hardware. There is sclerotic changes of sacroiliitis bilaterally. No acute fracture. Midline vertical anterior pelvic wall surgical staples. The soft tissues are grossly unremarkable. IMPRESSION: 1. Stable positioning of the enteric tube with tip in the distal stomach. 2. Stable mildly dilated air-filled loop of small bowel in the left  lower quadrant measuring up to 3.6 cm in diameter. Follow-up recommended. Electronically Signed   By: Anner Crete M.D.   On: 11/27/2016 02:03   Ct Angio Chest Pe W Or Wo Contrast  Result Date: 11/20/2016 CLINICAL DATA:  Acute onset chest pain yesterday. Patient status post laparoscopic cholecystectomy 10/30/2016. EXAM: CT ANGIOGRAPHY CHEST WITH CONTRAST TECHNIQUE: Multidetector CT imaging of the chest was performed using the standard protocol during bolus administration of intravenous contrast. Multiplanar CT image reconstructions and MIPs were obtained to evaluate the vascular anatomy. CONTRAST:  100 cc Isovue 370. COMPARISON:  PA and lateral chest 10/28/2016. Single-view of the chest 11/20/2016. FINDINGS: Cardiovascular: No pulmonary embolus is identified. Heart size is enlarged. No pericardial effusion. Calcific aortic and coronary atherosclerosis is identified. Mediastinum/Nodes: No enlarged mediastinal, hilar, or axillary lymph nodes. Thyroid gland, trachea, and esophagus demonstrate no significant findings. Lungs/Pleura: No pleural effusion. Lungs demonstrate centrilobular emphysematous disease. Mild dependent atelectasis is seen. No consolidative process, nodule or mass. Upper Abdomen: Negative. Musculoskeletal: No acute or focal bony abnormality. Diffuse subcutaneous edema is seen. Review of the MIP images confirms the above findings. IMPRESSION: Negative for pulmonary embolus or acute  disease. Emphysema. Atherosclerosis. Body wall edema compatible with anasarca. Electronically Signed   By: Inge Rise M.D.   On: 11/20/2016 15:50   Ct Abdomen Pelvis W Contrast  Result Date: 12/13/2016 CLINICAL DATA:  Vomiting EXAM: CT ABDOMEN AND PELVIS WITH CONTRAST TECHNIQUE: Multidetector CT imaging of the abdomen and pelvis was performed using the standard protocol following bolus administration of intravenous contrast. CONTRAST:  178m ISOVUE-300 IOPAMIDOL (ISOVUE-300) INJECTION 61% COMPARISON:   12/06/2016, CT 11/23/2016 FINDINGS: Lower chest: Consolidation within the right lower lobe. Small bilateral pleural effusions. Dense mitral calcifications. Borderline to mild cardiomegaly. Hepatobiliary: No biliary dilatation. Surgical clips at the gallbladder fossa. Fluid density vague lesion adjacent to or within lateral segment of left hepatic lobe. Pancreas: Unremarkable. No pancreatic ductal dilatation or surrounding inflammatory changes. Spleen: Normal in size without focal abnormality. Adrenals/Urinary Tract: Adrenal glands are within normal limits. Kidneys show no hydronephrosis. The bladder is normal. Stomach/Bowel: The stomach is nonenlarged. There is no dilated small bowel. No significant colon wall thickening. Vascular/Lymphatic: Atherosclerosis. Non aneurysmal aorta. No grossly enlarged lymph nodes Reproductive: Enhancing masses within the uterus with partial calcification, consistent with fibroids. No adnexal masses. Other: No free air. Trace free fluid in the pelvis. Diffuse subcutaneous edema. Ventral postsurgical scarring. Musculoskeletal: No acute osseous abnormality. Postsurgical changes of the lumbar spine. IMPRESSION: 1. Small bilateral pleural effusions. There is dense consolidation in the right lower lobe which may reflect atelectasis, pneumonia or aspiration 2. There is no evidence for small bowel dilatation or evidence of a small bowel obstruction. 3. Small free fluid in the pelvis 4. Enhancing uterine masses suggesting fibroids. Electronically Signed   By: KDonavan FoilM.D.   On: 12/13/2016 00:02   Ct Abdomen Pelvis W Contrast  Result Date: 11/23/2016 CLINICAL DATA:  Persistent generalized abdominal pain. EXAM: CT ABDOMEN AND PELVIS WITH CONTRAST TECHNIQUE: Multidetector CT imaging of the abdomen and pelvis was performed using the standard protocol following bolus administration of intravenous contrast. CONTRAST:  1073mISOVUE-300 IOPAMIDOL (ISOVUE-300) INJECTION 61% COMPARISON:  CT  scan of November 10, 2016. FINDINGS: Lower chest: No acute abnormality. Hepatobiliary: No focal liver abnormality is seen. Status post cholecystectomy. No biliary dilatation. Pancreas: Unremarkable. No pancreatic ductal dilatation or surrounding inflammatory changes. Spleen: Normal in size without focal abnormality. Adrenals/Urinary Tract: Adrenal glands are unremarkable. No hydronephrosis or renal obstruction is noted. Foley catheter is noted in the urinary bladder. Stomach/Bowel: Mild small bowel dilatation is noted consistent with ileus or possibly small bowel obstruction. Vascular/Lymphatic: Aortic atherosclerosis. No enlarged abdominal or pelvic lymph nodes. Reproductive: Uterus and bilateral adnexa are unremarkable. Other: Mild anasarca is noted. No abnormal fluid collection is noted. Musculoskeletal: Status post surgical posterior fusion extending from L2-L5. IMPRESSION: Aortic atherosclerosis. Mild anasarca. Stable mild small bowel dilatation is noted consistent with ileus or possible small bowel obstruction. Electronically Signed   By: JaMarijo ConceptionM.D.   On: 11/23/2016 17:13   Dg Chest Port 1 View  Result Date: 12/02/2016 CLINICAL DATA:  Shortness of breath. EXAM: PORTABLE CHEST 1 VIEW COMPARISON:  11/29/2016 cavoatrial junction. FINDINGS: Right arm PICC line tip is in the projection of the cavoatrial junction. Mild cardiac enlargement. Pleural effusions are less apparent on today's study. No interstitial edema. IMPRESSION: 1. Decrease in appearance of bilateral pleural effusions. 2. No new findings. Electronically Signed   By: TaKerby Moors.D.   On: 12/02/2016 11:21   Dg Chest Port 1 View  Result Date: 11/29/2016 CLINICAL DATA:  Dyspnea EXAM: PORTABLE CHEST 1 VIEW COMPARISON:  Chest CT from 2 days ago FINDINGS: Stable borderline heart size and unremarkable mediastinal contours when accounting for distortion by rotation. Right upper extremity PICC with tip at the SVC level. Haziness of the  lower chest from pleural effusions with mild atelectasis. No Kerley lines. No pneumothorax. No air bronchograms. IMPRESSION: Small pleural effusions and mild atelectasis at the bases, similar in chest CT 2 days ago. Electronically Signed   By: Monte Fantasia M.D.   On: 11/29/2016 12:38   Dg Chest Port 1v Same Day  Result Date: 11/20/2016 CLINICAL DATA:  Chest pain EXAM: PORTABLE CHEST 1 VIEW COMPARISON:  November 03, 2016 FINDINGS: Nasogastric tube tip and side port are below the diaphragm. Central catheter tip is in the superior vena cava. No pneumothorax. There is no edema or consolidation. Heart size and pulmonary vascularity are normal. No adenopathy. IMPRESSION: Tube and catheter positions as described. No evident pneumothorax. No edema or consolidation. Electronically Signed   By: Lowella Grip III M.D.   On: 11/20/2016 09:03   Dg Abd Portable 1v  Result Date: 11/24/2016 CLINICAL DATA:  Location of NG tube. Nausea. Status post cholecystectomy on 10/30/2016 with exploratory laparotomy for removal of adhesions on 11/17/2016 and redo exploratory laparatomy today. EXAM: PORTABLE ABDOMEN - 1 VIEW COMPARISON:  11/16/2016 FINDINGS: The tip and side port of a gastric tube is seen below the left hemidiaphragm in the expected location of the body of the stomach with tip crossing midline reports the right lower quadrant. Mild scattered gas containing small and large bowel loops are noted. Postsurgical clips are seen overlying the pelvis. Patient status post multilevel lumbar spinal fusion. IMPRESSION: The tip and side hole of a gastric tube are in the expected location of the gastric antrum and body. These results will be called to the ordering clinician or representative by the Radiologist Assistant, and communication documented in the PACS or zVision Dashboard. Electronically Signed   By: Ashley Royalty M.D.   On: 11/24/2016 19:55   Dg Swallowing Func-speech Pathology  Result Date: 12/13/2016 Objective  Swallowing Evaluation: Type of Study: MBS-Modified Barium Swallow Study Patient Details Name: Melanie [name] MRN: [id number] Date of Birth: 09/22/1936 Today's Date: 12/13/2016 Time: SLP Start Time (ACUTE ONLY): 1330-SLP Stop Time (ACUTE ONLY): 1340 SLP Time Calculation (min) (ACUTE ONLY): 10 min Past Medical History: Past Medical History: Diagnosis Date . Anxiousness  . COPD (chronic obstructive pulmonary disease) ([place])  . Hyperlipidemia  . Hypertension  . Small bowel obstruction due to adhesions 11/17/2016 . Uterine fibroid  Past Surgical History: Past Surgical History: Procedure Laterality Date . BACK SURGERY    X2 . bone removed from skull Right 1957 . CHOLECYSTECTOMY N/A 10/30/2016  Procedure: LAPAROSCOPIC CHOLECYSTECTOMY WITH INTRAOPERATIVE CHOLANGIOGRAM;  Surgeon: Johnathan Hausen, MD;  Location: [facility];  Service: General;  Laterality: N/A; . JOINT REPLACEMENT    knee . LAPAROTOMY N/A 11/17/2016  Procedure: EXPLORATORY LAPAROTOMY WITH LYSIS OF ADHESIONS;  Surgeon: Fanny Skates, MD;  Location: [facility];  Service: General;  Laterality: N/A; . LAPAROTOMY N/A 11/24/2016  Procedure: RE DO EXPLORATORY LAPAROTOMY;  Surgeon: Jackolyn Confer, MD;  Location: [facility];  Service: General;  Laterality: N/A; HPI: 81 y.o. female with medical history significant of hypertension, hyperlipidemia, COPD, GERD, depression, uterine fibroids, small bowel obstruction, who presents with shortness breath, nausea, vomiting, diarrhea and abdominal pain.   Subjective: pt awake in chair Assessment / Plan / Recommendation CHL IP CLINICAL IMPRESSIONS 12/13/2016 Clinical Impression Pt presents with minimal oral deficits resulting in delayed oral  transiting and piecemealing.  Suspect this is due to pt's h/o nausea/vomiting as she confirms oral deficits not present prior to N/V/D issues.  Pharyngeal swallow was timely and strong without residuals nor aspiration/penetration.    Of note, pt admits to problems with vomiting after meals and states food "balls up in  mouth".  Recommend diet as Md indicates. No SLP folllow up indicated.  Thanks for referral.  SLP Visit Diagnosis Dysphagia, oral phase (R13.11) Attention and concentration deficit following -- Frontal lobe and executive function deficit following -- Impact on safety and function Mild aspiration risk   No flowsheet data found.  No flowsheet data found. CHL IP DIET RECOMMENDATION 12/13/2016 SLP Diet Recommendations (No Data) Liquid Administration via Cup;Straw Medication Administration Whole meds with liquid Compensations Slow rate;Small sips/bites Postural Changes Remain semi-upright after after feeds/meals (Comment)   CHL IP OTHER RECOMMENDATIONS 12/13/2016 Recommended Consults -- Oral Care Recommendations Oral care BID Other Recommendations --   CHL IP FOLLOW UP RECOMMENDATIONS 12/13/2016 Follow up Recommendations None        CHL IP ORAL PHASE 12/13/2016 Oral Phase -- Oral - Pudding Teaspoon -- Oral - Pudding Cup -- Oral - Honey Teaspoon -- Oral - Honey Cup -- Oral - Nectar Teaspoon -- Oral - Nectar Cup Delayed oral transit;Premature spillage Oral - Nectar Straw Delayed oral transit;Premature spillage Oral - Thin Teaspoon -- Oral - Thin Cup Delayed oral transit;Premature spillage Oral - Thin Straw Premature spillage;Delayed oral transit Oral - Puree Delayed oral transit;Premature spillage;Lingual pumping Oral - Mech Soft Delayed oral transit;Premature spillage;Lingual pumping Oral - Regular -- Oral - Multi-Consistency -- Oral - Pill -- Oral Phase - Comment --  CHL IP PHARYNGEAL PHASE 12/13/2016 Pharyngeal Phase WFL Pharyngeal- Pudding Teaspoon -- Pharyngeal -- Pharyngeal- Pudding Cup -- Pharyngeal -- Pharyngeal- Honey Teaspoon -- Pharyngeal -- Pharyngeal- Honey Cup -- Pharyngeal -- Pharyngeal- Nectar Teaspoon -- Pharyngeal -- Pharyngeal- Nectar Cup WFL Pharyngeal -- Pharyngeal- Nectar Straw -- Pharyngeal -- Pharyngeal- Thin Teaspoon -- Pharyngeal -- Pharyngeal- Thin Cup WFL Pharyngeal -- Pharyngeal- Thin Straw WFL Pharyngeal  -- Pharyngeal- Puree WFL Pharyngeal -- Pharyngeal- Mechanical Soft WFL Pharyngeal -- Pharyngeal- Regular -- Pharyngeal -- Pharyngeal- Multi-consistency -- Pharyngeal -- Pharyngeal- Pill -- Pharyngeal -- Pharyngeal Comment --  CHL IP CERVICAL ESOPHAGEAL PHASE 12/13/2016 Cervical Esophageal Phase WFL Pudding Teaspoon -- Pudding Cup -- Honey Teaspoon -- Honey Cup -- Nectar Teaspoon -- Nectar Cup -- Nectar Straw -- Thin Teaspoon -- Thin Cup -- Thin Straw -- Puree -- Mechanical Soft -- Regular -- Multi-consistency -- Pill -- Cervical Esophageal Comment -- No flowsheet data found. Luanna Salk, MS [facility] SLP [phone]              Ct Angio Chest/abd/pel For Dissection W And/or W/wo  Result Date: 11/27/2016 CLINICAL DATA:  81 year old female with chest and abdominal pain. EXAM: CT ANGIOGRAPHY CHEST, ABDOMEN AND PELVIS TECHNIQUE: Multidetector CT imaging through the chest, abdomen and pelvis was performed using the standard protocol during bolus administration of intravenous contrast. Multiplanar reconstructed images and MIPs were obtained and reviewed to evaluate the vascular anatomy. CONTRAST:  100 cc Isovue 370 COMPARISON:  Abdominal CT 11/23/2016 FINDINGS: CTA CHEST FINDINGS Cardiovascular: There is mild cardiomegaly with biatrial dilatation. Multi vessel coronary vascular disease as well as calcification of the mitral annulus noted. There is a retrograde flow of contrast from the right atrium into the IVC compatible with a degree of right cardiac dysfunction. No pericardial effusion. There is atherosclerotic calcification of the thoracic aorta and the  origins of the great vessels of the aortic arch. There is common origin of the left common carotid artery and right brachiocephalic trunk. No aortic aneurysm or dissection. Evaluation of the pulmonary arteries is limited due to suboptimal opacification of the peripheral branches as well as respiratory motion artifact. No definite central pulmonary artery embolus  identified. V/Q scan may provide better evaluation if there is high clinical concern for pulmonary embolism. Mediastinum/Nodes: There is no hilar or mediastinal adenopathy. An enteric tube is noted within the esophagus. Lungs/Pleura: There is emphysematous changes of the lungs. There are small bilateral pleural effusions, new from prior CT of 11/20/2016. Subsegmental consolidative changes of the lung bases noted which may be related to compressive atelectasis or pneumonia. No pneumothorax. The central airways are patent. Musculoskeletal: Top-normal bilateral axillary lymph nodes of indeterminate etiology, possibly reactive. Clinical correlation is recommended. There is diffuse subcutaneous soft tissue edema and anasarca. No fluid collection. There is degenerative changes of the spine. No acute fracture. Review of the MIP images confirms the above findings. CTA ABDOMEN AND PELVIS FINDINGS VASCULAR Evaluation is limited due to respiratory motion artifact as well as streak artifact caused by metallic spinal hardware. Aorta: Advanced aortoiliac atherosclerotic disease. No aneurysmal dilatation or evidence of dissection Celiac: There is atherosclerotic calcification of the origin of the celiac axis with high-grade inclusion of the vessel. Only a small amount of contrast noted in the proximal portion of the celiac axis. SMA: Advanced atherosclerotic calcification of the origin of the SMA with diminished flow within the vessel. Renals: There is atherosclerotic calcification of the ostia of the renal arteries. The renal arteries however remain patent. IMA: There is atherosclerotic calcification of the origin of the IMA. The IMA remain patent. Inflow: The iliac arteries remain patent. No aneurysmal dilatation for dissection. Veins: No obvious venous abnormality within the limitations of this arterial phase study. Review of the MIP images confirms the above findings. NON-VASCULAR Evaluation of the structures is very limited  due to anasarca and artifact caused by metallic spinal hardware. Small pocket of extraluminal air in the anterior pelvis (series 10 image 144) is likely related to recent surgery. Small pocket of air noted along the midline vertical surgical incision. There is a small ascites and diffuse mesenteric edema. Hepatobiliary: Cholecystectomy. The liver is grossly unremarkable with limited evaluation. Pancreas: There is limited visualization of the pancreas. The pancreas is grossly unremarkable. Spleen: Grossly unremarkable on limited evaluation. Adrenals/Urinary Tract: The left adrenal gland appears unremarkable. The right adrenal gland is not well visualized. There is no hydronephrosis on either side. The urinary bladder is partially decompressed around a Foley catheter. Stomach/Bowel: Evaluation of the bowel is very limited in the absence of oral contrast. An enteric feeding extends into distal stomach. Multiple top-normal caliber fluid-filled loops of small bowel throughout the abdomen may represent postsurgical ileus. Thickened appearance of loops of small bowel in the lower abdomen and pelvis likely related to underdistention and ascites. Enteritis is not excluded. Lymphatic: No adenopathy. Reproductive: The uterus is not well visualized and limited pain evaluation. The uterus appears heterogeneous and probably contains a fibroid. Other: There is diffuse subcutaneous edema and anasarca. Midline vertical anterior pelvic wall incisional scar noted. Small amount of fluid noted along the surgical incision. A small pockets of air is also seen in the subcutaneous soft tissues of the midline anterior abdomen along the surgical incision. Musculoskeletal: Multilevel degenerative changes of the spine. Lower lumbar laminectomy and posterior fusion hardware. No acute fracture. Review of the MIP images  confirms the above findings. IMPRESSION: 1. Emphysema with interval development of small bilateral pleural effusions and  bibasilar subsegmental atelectasis versus less likely infiltrate. 2. Cardiomegaly with biatrial dilatation and evidence of right cardiac dysfunction. Correlation with echocardiogram recommended. 3. Small ascites, diffuse subcutaneous soft tissue edema and anasarca. 4. 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 Evaluation of the vessels is very limited due to artifact. There is advanced aortoiliac atherosclerotic disease. 5. Mildly dilated fluid-filled loops of small bowel elbow likely related to ileus. Overall there has been interval improvement in the degree of bowel dilatation compared to the prior CT. Clinical correlation and follow-up recommended. Thickened appearing loops of small bowel likely related to under distention and ascites. Enteritis is less likely. 6. Postsurgical changes with focal small pockets of extraluminal air. No drainable fluid collection/abscess. Electronically Signed   By: Anner Crete M.D.   On: 11/27/2016 05:35    Microbiology: Recent Results (from the past 240 hour(s))  C difficile quick scan w PCR reflex     Status: None   Collection Time: 12/08/16  4:53 PM  Result Value Ref Range Status   C Diff antigen NEGATIVE NEGATIVE Final   C Diff toxin NEGATIVE NEGATIVE Final   C Diff interpretation No C. difficile detected.  Final  Blood Culture (routine x 2)     Status: None (Preliminary result)   Collection Time: 12/13/16 12:49 AM  Result Value Ref Range Status   Specimen Description BLOOD LEFT ARM  Final   Special Requests BOTTLES DRAWN AEROBIC AND ANAEROBIC 10CC  Final   Culture   Final    NO GROWTH 3 DAYS Performed at [facility], [place] [address], [place], [place] [address]    Report Status PENDING  Incomplete  Blood Culture (routine x 2)     Status: None (Preliminary result)   Collection Time: 12/13/16 12:51 AM  Result Value Ref Range Status   Specimen Description BLOOD RIGHT ANTECUBITAL  Final   Special Requests  BOTTLES DRAWN AEROBIC AND ANAEROBIC 10CC  Final   Culture   Final    NO GROWTH 3 DAYS Performed at [facility], [place] [address], [place], [place] [address]    Report Status PENDING  Incomplete  MRSA PCR Screening     Status: None   Collection Time: 12/13/16  4:25 AM  Result Value Ref Range Status   MRSA by PCR NEGATIVE NEGATIVE Final    Comment:        The GeneXpert MRSA Assay (FDA approved for NASAL specimens only), is one component of a comprehensive MRSA colonization surveillance program. It is not intended to diagnose MRSA infection nor to guide or monitor treatment for MRSA infections.      Labs: Basic Metabolic Panel:  Recent Labs Lab 12/12/16 2141 12/14/16 0554 12/15/16 0509 12/16/16 0554 12/17/16 0513  NA 140  --  142 144  --   K 3.9  --  2.4* 4.0  --   CL 102  --  107 112*  --   CO2 24  --  27 26  --   GLUCOSE 95  --  128* 180*  --   BUN 16  --  15 23*  --   CREATININE 1.03* 0.69 1.03* 1.43* 1.62*  CALCIUM 7.8*  --  8.2* 8.2*  --   MG  --   --   --  2.0  --    Liver Function Tests:  Recent Labs Lab 12/12/16 2141  AST 30  ALT 21  ALKPHOS 116  BILITOT 1.5*  PROT 7.1  ALBUMIN 2.5*    Recent Labs Lab 12/12/16 2141  LIPASE 22   CBC:  Recent Labs Lab 12/12/16 2141 12/15/16 0509  WBC 18.1* 23.3*  NEUTROABS 14.0*  --   HGB 8.9* 8.0*  HCT 27.2* 24.4*  MCV 82.4 84.7  PLT 381 308   BNP (last 3 results)  Recent Labs  11/20/16 0833  BNP 201.1*   Signed:  Barton Dubois MD.  [facility] 12/17/2016, 1:09 PM

## 2016-12-17 NOTE — Clinical Social Work Note (Signed)
Patient to be d/c'ed today to [facility].  Patient and family agreeable to plans will transport via ems RN to call report to [phone].  Evette Cristal, MSW, [place]

## 2016-12-17 NOTE — Progress Notes (Signed)
[facility] Liaison RN visit 0900  Met with daughters Hilda Blades and Ulla Gallo in the patients room confirming desire for [facility]. Explained services and registration paperwork completed. Family agreeable to transfer today. Dr. Kelton Pillar requested by family to be patient's attending. [place] notified.   Please fax discharge summary to [phone].  RN please call report to [phone].  Please arrange transport for patient to arrive before noon if possible.  Thank you, [facility] Liaison [phone]  [facility] are now on [place]. Please feel free to call me at the above number or [phone] after hours.

## 2016-12-18 LAB — CULTURE, BLOOD (ROUTINE X 2)
CULTURE: NO GROWTH
Culture: NO GROWTH

## 2017-01-07 DEATH — deceased

## 2017-04-20 NOTE — Anesthesia Postprocedure Evaluation (Signed)
Anesthesia Post Note  Patient: Melanie [name]  Procedure(s) Performed: Procedure(s) (LRB): RE DO EXPLORATORY LAPAROTOMY (N/A)     Anesthesia Post Evaluation  Last Vitals:  Vitals:   12/08/16 2330 12/09/16 0418  BP: (!) 160/48 (!) 183/59  Pulse:  86  Resp:  18  Temp:  37.1 C    Last Pain:  Vitals:   12/09/16 0803  TempSrc:   PainSc: 0-No pain                 Doranne Schmutz EDWARD

## 2017-04-20 NOTE — Addendum Note (Signed)
Addendum  created 04/20/17 1707 by Lyndle Herrlich, MD   Sign clinical note

## 2017-09-21 IMAGING — RF DG SWALLOWING FUNCTION - NRPT [facility]
7 series · 20 of 24 positions shown · non-contrast
Comparison: none

[Series 1: cp_standard · 0.34mm/px · 2 of 115 frames shown (1 of 7)]
[frame 18/115]
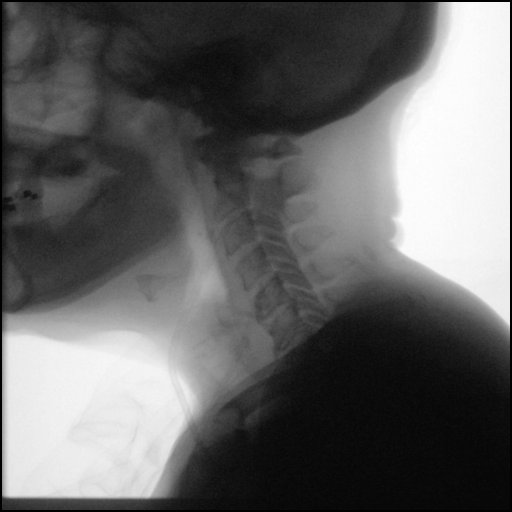
[frame 58/115]
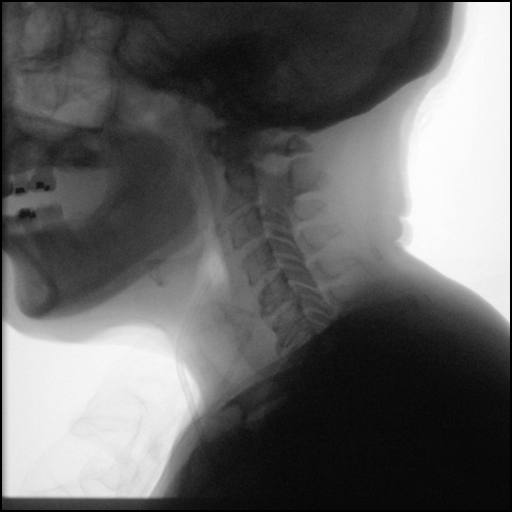

[Series 2: cp_standard · 0.34mm/px · 4 of 56 frames shown (2 of 7)]
[frame 9/56]
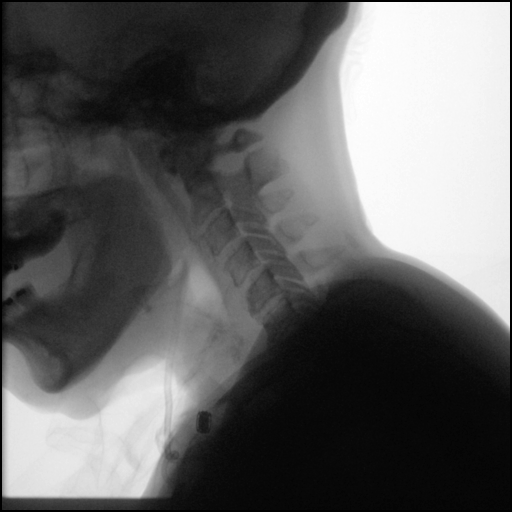
[frame 29/56]
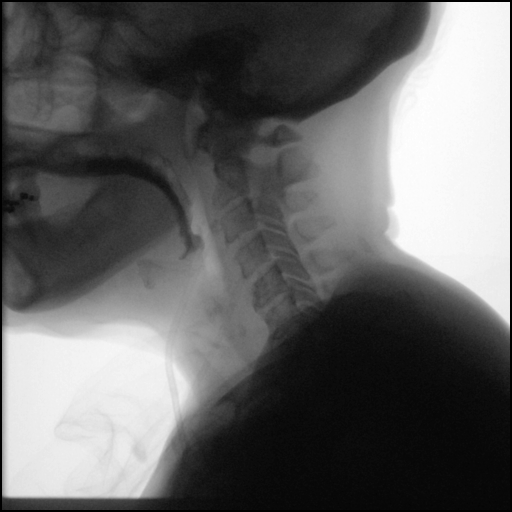
[frame 41/56]
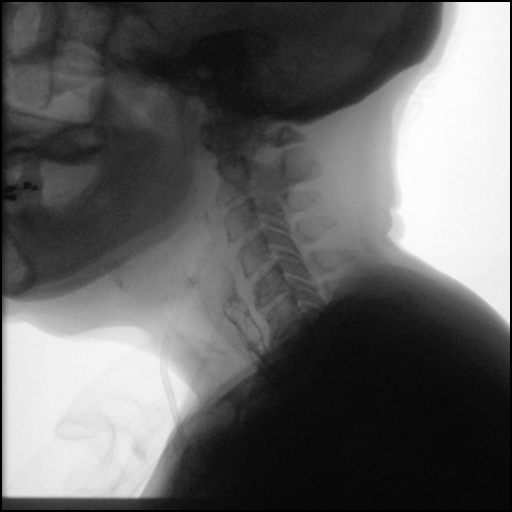
[frame 48/56]
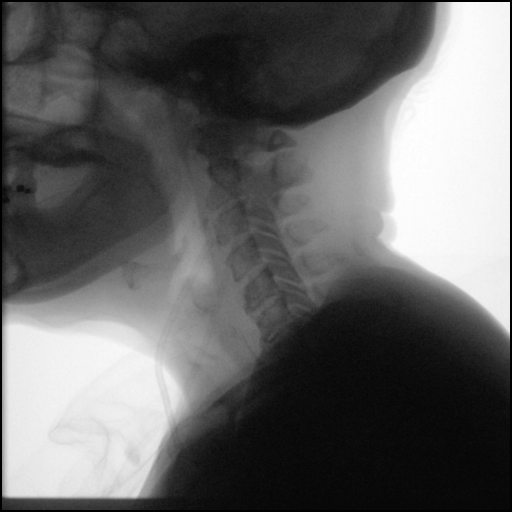

[Series 3: cp_standard · 0.34mm/px · 2 of 71 frames shown (3 of 7)]
[frame 11/71]
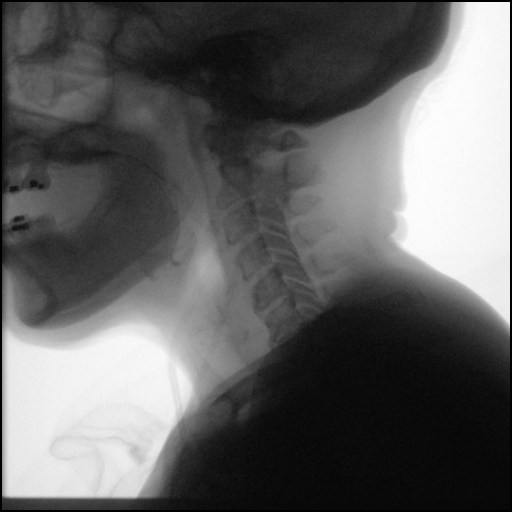
[frame 61/71]
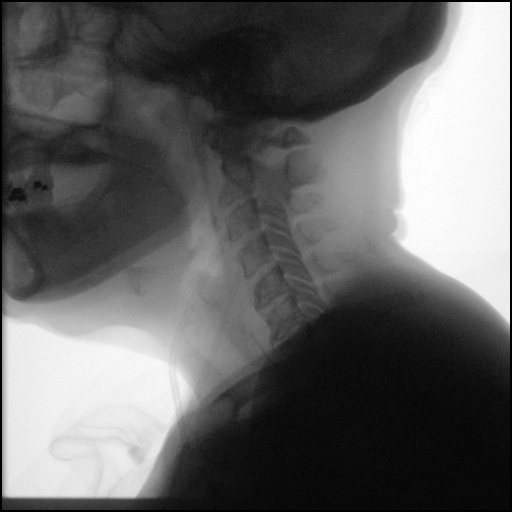

[Series 4: cp_standard · 0.34mm/px · 4 of 235 frames shown (4 of 7)]
[frame 36/235]
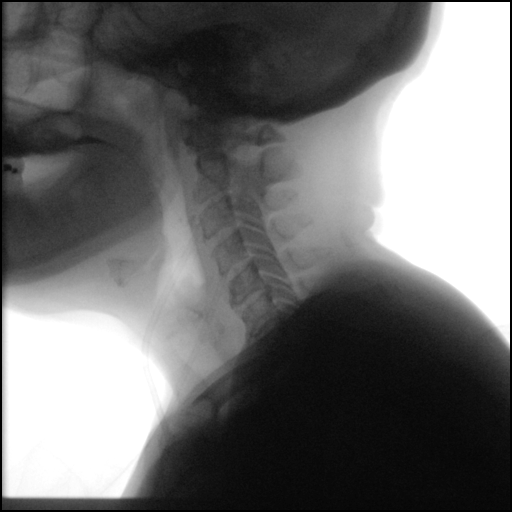
[frame 118/235]
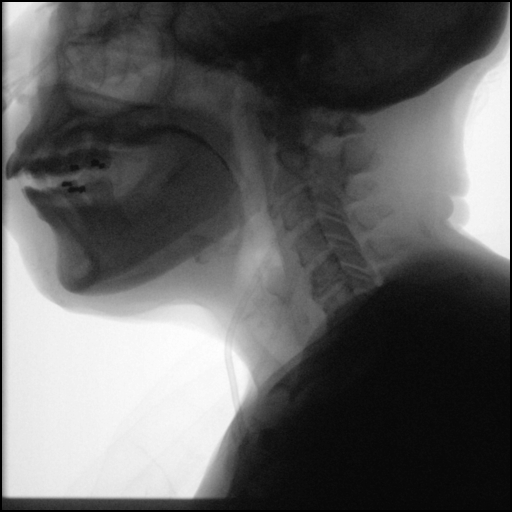
[frame 141/235]
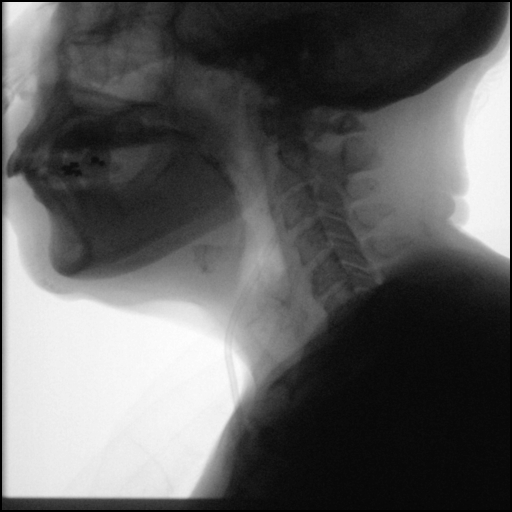
[frame 200/235]
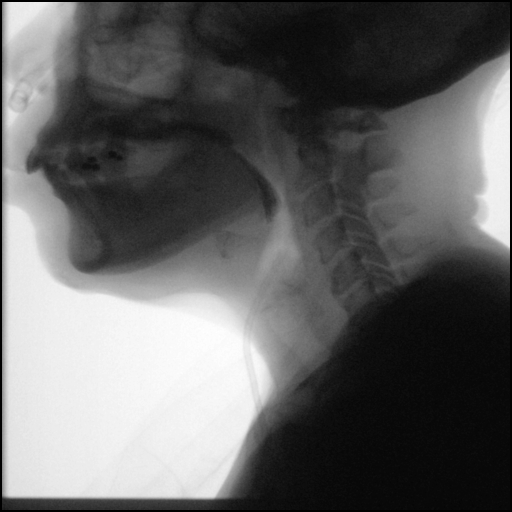

[Series 5: cp_standard · 0.34mm/px · 2 of 84 frames shown (5 of 7)]
[frame 43/84]
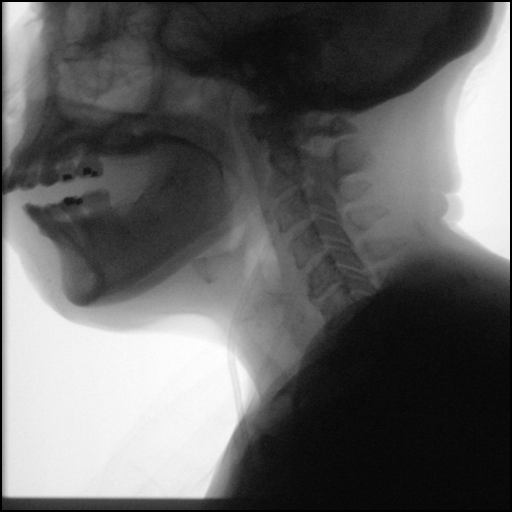
[frame 72/84]
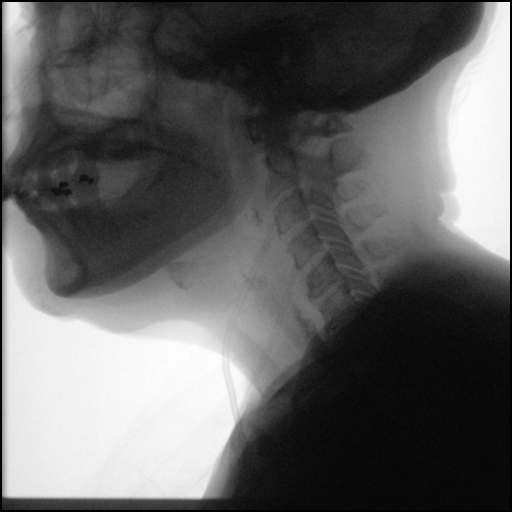

[Series 6: cp_standard · 0.34mm/px · 3 of 52 frames shown (6 of 7)]
[frame 8/52]
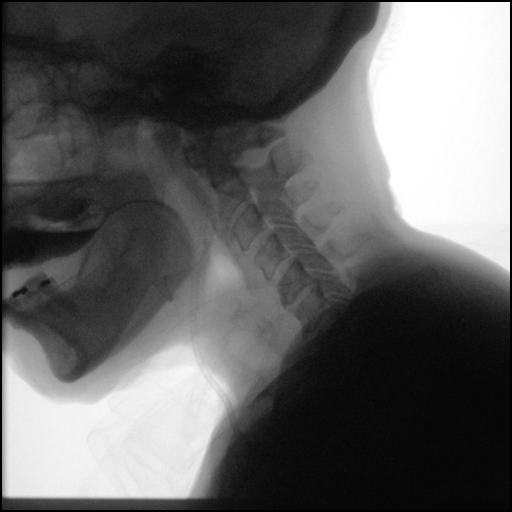
[frame 27/52]
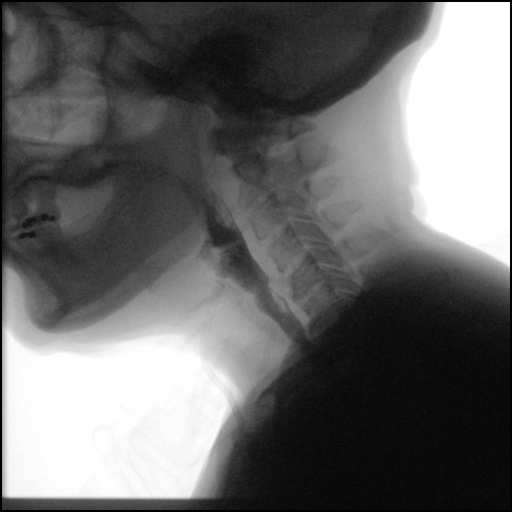
[frame 42/52]
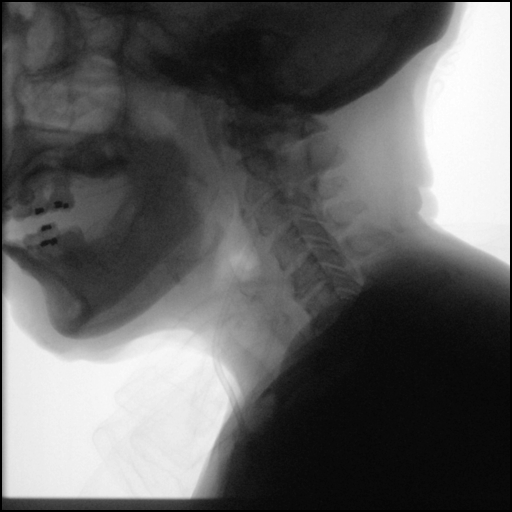

[Series 7: cp_standard · 0.34mm/px · 3 of 406 frames shown (7 of 7)]
[frame 61/406]
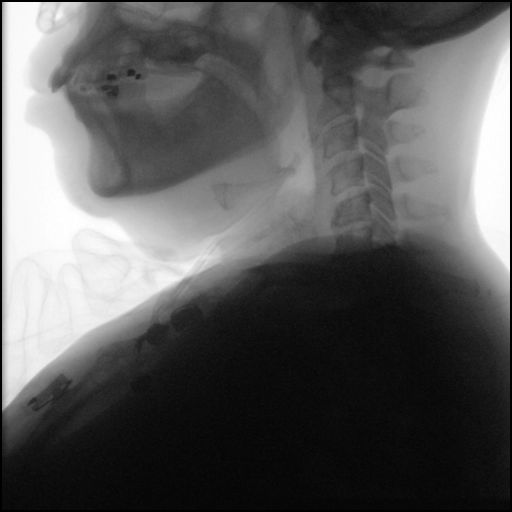
[frame 204/406]
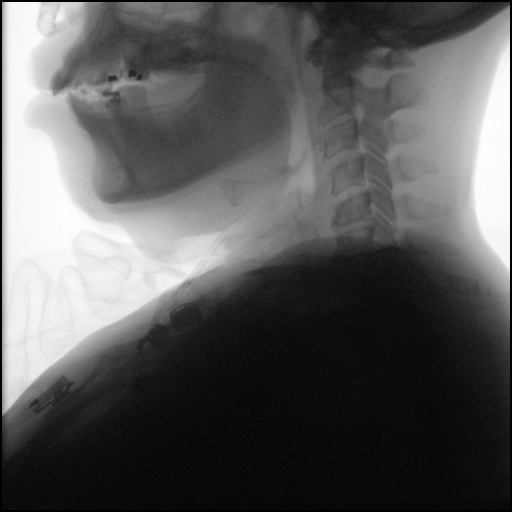
[frame 346/406]
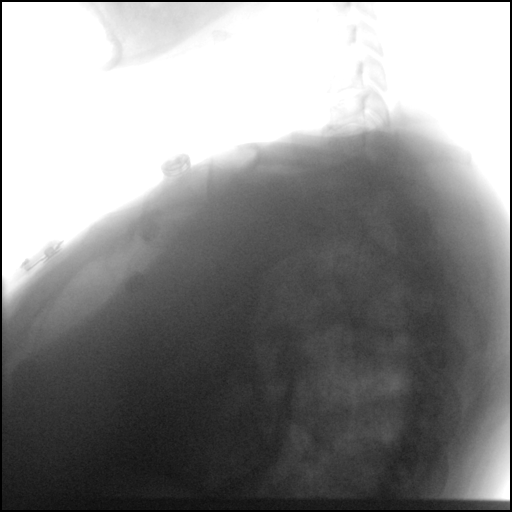

[20 of 24 positions shown; findings below may reference images not displayed]

FLUOROSCOPY FOR SWALLOWING FUNCTION STUDY:
Fluoroscopy was provided for swallowing function study, which was administered by a speech pathologist.  Final results and recommendations from this study are contained within the speech pathology report.
